# Patient Record
Sex: Female | Born: 1955 | Race: White | Hispanic: No | Marital: Single | Smoking: Never smoker
Health system: Southern US, Community
[De-identification: ages and names within clinical notes are randomized; demographics above are authoritative.]

## PROBLEM LIST (undated history)

## (undated) DIAGNOSIS — R569 Unspecified convulsions: Secondary | ICD-10-CM

## (undated) DIAGNOSIS — K909 Intestinal malabsorption, unspecified: Secondary | ICD-10-CM

## (undated) DIAGNOSIS — K7030 Alcoholic cirrhosis of liver without ascites: Secondary | ICD-10-CM

## (undated) DIAGNOSIS — K859 Acute pancreatitis without necrosis or infection, unspecified: Secondary | ICD-10-CM

## (undated) DIAGNOSIS — K769 Liver disease, unspecified: Secondary | ICD-10-CM

## (undated) DIAGNOSIS — K219 Gastro-esophageal reflux disease without esophagitis: Secondary | ICD-10-CM

## (undated) DIAGNOSIS — E119 Type 2 diabetes mellitus without complications: Secondary | ICD-10-CM

## (undated) DIAGNOSIS — K861 Other chronic pancreatitis: Secondary | ICD-10-CM

## (undated) HISTORY — DX: Unspecified convulsions: R56.9

## (undated) HISTORY — PX: TUBAL LIGATION: SHX77

## (undated) HISTORY — PX: CHOLECYSTECTOMY: SHX55

## (undated) HISTORY — PX: MYOMECTOMY: SHX85

## (undated) HISTORY — DX: Intestinal malabsorption, unspecified: K90.9

## (undated) HISTORY — PX: CATARACT EXTRACTION: SUR2

## (undated) HISTORY — DX: Acute pancreatitis without necrosis or infection, unspecified: K85.90

## (undated) HISTORY — DX: Other chronic pancreatitis: K86.1

## (undated) HISTORY — PX: ROUX-EN-Y GASTRIC BYPASS: SHX1104

## (undated) HISTORY — PX: APPENDECTOMY: SHX54

## (undated) HISTORY — PX: OTHER SURGICAL HISTORY: SHX169

## (undated) MED FILL — Medication: Fill #0 | Status: CN

## (undated) MED FILL — Buprenorphine TD Patch Weekly 20 MCG/HR: TRANSDERMAL | Fill #0 | Status: CN

## (undated) MED FILL — Buprenorphine TD Patch Weekly 20 MCG/HR: TRANSDERMAL | Fill #0 | Status: AC

---

## 1988-03-15 HISTORY — PX: JEJUNOSTOMY FEEDING TUBE: SUR737

## 1997-08-21 ENCOUNTER — Other Ambulatory Visit: Admission: RE | Admit: 1997-08-21 | Discharge: 1997-08-21 | Payer: Self-pay | Admitting: Gynecology

## 1998-06-30 ENCOUNTER — Encounter: Payer: Self-pay | Admitting: Gastroenterology

## 1998-06-30 ENCOUNTER — Ambulatory Visit (HOSPITAL_COMMUNITY): Admission: RE | Admit: 1998-06-30 | Discharge: 1998-06-30 | Payer: Self-pay | Admitting: Gastroenterology

## 1998-10-18 ENCOUNTER — Ambulatory Visit (HOSPITAL_COMMUNITY): Admission: RE | Admit: 1998-10-18 | Discharge: 1998-10-18 | Payer: Self-pay | Admitting: *Deleted

## 1998-10-18 ENCOUNTER — Encounter: Payer: Self-pay | Admitting: *Deleted

## 1998-12-05 ENCOUNTER — Encounter: Payer: Self-pay | Admitting: Gastroenterology

## 1998-12-05 ENCOUNTER — Ambulatory Visit (HOSPITAL_COMMUNITY): Admission: RE | Admit: 1998-12-05 | Discharge: 1998-12-05 | Payer: Self-pay | Admitting: Interventional Radiology

## 1998-12-18 ENCOUNTER — Encounter: Payer: Self-pay | Admitting: Gastroenterology

## 1998-12-18 ENCOUNTER — Ambulatory Visit (HOSPITAL_COMMUNITY): Admission: RE | Admit: 1998-12-18 | Discharge: 1998-12-18 | Payer: Self-pay | Admitting: Gastroenterology

## 1999-02-09 ENCOUNTER — Other Ambulatory Visit: Admission: RE | Admit: 1999-02-09 | Discharge: 1999-02-09 | Payer: Self-pay | Admitting: Gynecology

## 1999-02-27 ENCOUNTER — Encounter: Payer: Self-pay | Admitting: Gastroenterology

## 1999-02-27 ENCOUNTER — Ambulatory Visit (HOSPITAL_COMMUNITY): Admission: RE | Admit: 1999-02-27 | Discharge: 1999-02-27 | Payer: Self-pay | Admitting: Gastroenterology

## 1999-03-04 ENCOUNTER — Ambulatory Visit (HOSPITAL_COMMUNITY): Admission: RE | Admit: 1999-03-04 | Discharge: 1999-03-04 | Payer: Self-pay | Admitting: Gastroenterology

## 1999-03-04 ENCOUNTER — Encounter: Payer: Self-pay | Admitting: Gastroenterology

## 1999-06-05 ENCOUNTER — Encounter: Payer: Self-pay | Admitting: Gastroenterology

## 1999-06-05 ENCOUNTER — Ambulatory Visit (HOSPITAL_COMMUNITY): Admission: RE | Admit: 1999-06-05 | Discharge: 1999-06-05 | Payer: Self-pay | Admitting: Gastroenterology

## 1999-06-15 ENCOUNTER — Encounter: Payer: Self-pay | Admitting: Gastroenterology

## 1999-06-15 ENCOUNTER — Ambulatory Visit: Admission: RE | Admit: 1999-06-15 | Discharge: 1999-06-15 | Payer: Self-pay | Admitting: Gastroenterology

## 1999-06-23 ENCOUNTER — Emergency Department (HOSPITAL_COMMUNITY): Admission: EM | Admit: 1999-06-23 | Discharge: 1999-06-24 | Payer: Self-pay | Admitting: Emergency Medicine

## 1999-06-23 ENCOUNTER — Encounter: Payer: Self-pay | Admitting: Emergency Medicine

## 1999-06-24 ENCOUNTER — Encounter: Payer: Self-pay | Admitting: Emergency Medicine

## 1999-07-03 ENCOUNTER — Encounter: Payer: Self-pay | Admitting: Gastroenterology

## 1999-07-03 ENCOUNTER — Ambulatory Visit (HOSPITAL_COMMUNITY): Admission: RE | Admit: 1999-07-03 | Discharge: 1999-07-03 | Payer: Self-pay | Admitting: Gastroenterology

## 1999-09-04 ENCOUNTER — Encounter: Payer: Self-pay | Admitting: Gastroenterology

## 1999-09-04 ENCOUNTER — Ambulatory Visit (HOSPITAL_COMMUNITY): Admission: RE | Admit: 1999-09-04 | Discharge: 1999-09-04 | Payer: Self-pay | Admitting: Gastroenterology

## 1999-09-18 ENCOUNTER — Encounter: Payer: Self-pay | Admitting: Gastroenterology

## 1999-09-18 ENCOUNTER — Ambulatory Visit (HOSPITAL_COMMUNITY): Admission: RE | Admit: 1999-09-18 | Discharge: 1999-09-18 | Payer: Self-pay | Admitting: Gastroenterology

## 1999-11-02 ENCOUNTER — Ambulatory Visit (HOSPITAL_COMMUNITY): Admission: RE | Admit: 1999-11-02 | Discharge: 1999-11-02 | Payer: Self-pay | Admitting: Gastroenterology

## 1999-11-02 ENCOUNTER — Encounter: Payer: Self-pay | Admitting: Gastroenterology

## 1999-11-06 ENCOUNTER — Ambulatory Visit (HOSPITAL_COMMUNITY): Admission: RE | Admit: 1999-11-06 | Discharge: 1999-11-06 | Payer: Self-pay | Admitting: Gastroenterology

## 1999-11-06 ENCOUNTER — Encounter: Payer: Self-pay | Admitting: Gastroenterology

## 1999-11-17 ENCOUNTER — Encounter: Payer: Self-pay | Admitting: Gastroenterology

## 1999-11-17 ENCOUNTER — Ambulatory Visit (HOSPITAL_COMMUNITY): Admission: RE | Admit: 1999-11-17 | Discharge: 1999-11-17 | Payer: Self-pay | Admitting: Gastroenterology

## 2000-02-01 ENCOUNTER — Other Ambulatory Visit: Admission: RE | Admit: 2000-02-01 | Discharge: 2000-02-01 | Payer: Self-pay | Admitting: Gynecology

## 2000-05-17 ENCOUNTER — Encounter: Admission: RE | Admit: 2000-05-17 | Discharge: 2000-08-15 | Payer: Self-pay | Admitting: Anesthesiology

## 2000-06-06 ENCOUNTER — Encounter: Payer: Self-pay | Admitting: Gastroenterology

## 2000-06-06 ENCOUNTER — Inpatient Hospital Stay (HOSPITAL_COMMUNITY): Admission: EM | Admit: 2000-06-06 | Discharge: 2000-06-17 | Payer: Self-pay | Admitting: Emergency Medicine

## 2000-06-06 ENCOUNTER — Encounter: Admission: RE | Admit: 2000-06-06 | Discharge: 2000-06-06 | Payer: Self-pay | Admitting: Gastroenterology

## 2000-06-07 ENCOUNTER — Encounter: Payer: Self-pay | Admitting: Gastroenterology

## 2000-06-09 ENCOUNTER — Encounter: Payer: Self-pay | Admitting: Gastroenterology

## 2000-06-11 ENCOUNTER — Encounter: Payer: Self-pay | Admitting: Gastroenterology

## 2000-06-13 ENCOUNTER — Encounter: Payer: Self-pay | Admitting: *Deleted

## 2000-09-06 ENCOUNTER — Encounter: Payer: Self-pay | Admitting: Emergency Medicine

## 2000-09-06 ENCOUNTER — Inpatient Hospital Stay (HOSPITAL_COMMUNITY): Admission: EM | Admit: 2000-09-06 | Discharge: 2000-09-22 | Payer: Self-pay | Admitting: Emergency Medicine

## 2000-09-07 ENCOUNTER — Encounter: Payer: Self-pay | Admitting: Gastroenterology

## 2000-09-08 ENCOUNTER — Encounter: Payer: Self-pay | Admitting: Gastroenterology

## 2000-09-10 ENCOUNTER — Encounter: Payer: Self-pay | Admitting: Gastroenterology

## 2000-09-11 ENCOUNTER — Encounter: Payer: Self-pay | Admitting: Gastroenterology

## 2000-09-14 ENCOUNTER — Encounter: Payer: Self-pay | Admitting: Gastroenterology

## 2000-09-20 ENCOUNTER — Encounter: Admission: RE | Admit: 2000-09-20 | Discharge: 2000-11-12 | Payer: Self-pay | Admitting: Anesthesiology

## 2000-10-14 ENCOUNTER — Encounter: Payer: Self-pay | Admitting: Gastroenterology

## 2000-10-14 ENCOUNTER — Ambulatory Visit (HOSPITAL_COMMUNITY): Admission: RE | Admit: 2000-10-14 | Discharge: 2000-10-14 | Payer: Self-pay | Admitting: Gastroenterology

## 2001-03-24 ENCOUNTER — Other Ambulatory Visit: Admission: RE | Admit: 2001-03-24 | Discharge: 2001-03-24 | Payer: Self-pay | Admitting: Gynecology

## 2001-06-28 ENCOUNTER — Encounter: Payer: Self-pay | Admitting: Gastroenterology

## 2001-06-28 ENCOUNTER — Ambulatory Visit (HOSPITAL_COMMUNITY): Admission: RE | Admit: 2001-06-28 | Discharge: 2001-06-28 | Payer: Self-pay | Admitting: Gastroenterology

## 2001-07-04 ENCOUNTER — Emergency Department (HOSPITAL_COMMUNITY): Admission: EM | Admit: 2001-07-04 | Discharge: 2001-07-04 | Payer: Self-pay | Admitting: *Deleted

## 2001-09-28 ENCOUNTER — Encounter: Payer: Self-pay | Admitting: Gastroenterology

## 2001-09-28 ENCOUNTER — Encounter: Admission: RE | Admit: 2001-09-28 | Discharge: 2001-09-28 | Payer: Self-pay | Admitting: Gastroenterology

## 2001-12-05 ENCOUNTER — Encounter: Payer: Self-pay | Admitting: Family Medicine

## 2001-12-05 ENCOUNTER — Encounter: Admission: RE | Admit: 2001-12-05 | Discharge: 2001-12-05 | Payer: Self-pay | Admitting: Family Medicine

## 2001-12-07 ENCOUNTER — Encounter: Payer: Self-pay | Admitting: Thoracic Surgery (Cardiothoracic Vascular Surgery)

## 2001-12-08 ENCOUNTER — Inpatient Hospital Stay (HOSPITAL_COMMUNITY)
Admission: RE | Admit: 2001-12-08 | Discharge: 2001-12-20 | Payer: Self-pay | Admitting: Thoracic Surgery (Cardiothoracic Vascular Surgery)

## 2001-12-08 ENCOUNTER — Encounter: Payer: Self-pay | Admitting: Thoracic Surgery (Cardiothoracic Vascular Surgery)

## 2001-12-08 ENCOUNTER — Encounter (INDEPENDENT_AMBULATORY_CARE_PROVIDER_SITE_OTHER): Payer: Self-pay | Admitting: *Deleted

## 2001-12-09 ENCOUNTER — Encounter: Payer: Self-pay | Admitting: Thoracic Surgery (Cardiothoracic Vascular Surgery)

## 2001-12-10 ENCOUNTER — Encounter: Payer: Self-pay | Admitting: Thoracic Surgery (Cardiothoracic Vascular Surgery)

## 2001-12-11 ENCOUNTER — Encounter: Payer: Self-pay | Admitting: Surgery

## 2001-12-12 ENCOUNTER — Encounter: Payer: Self-pay | Admitting: Thoracic Surgery (Cardiothoracic Vascular Surgery)

## 2001-12-13 ENCOUNTER — Encounter: Payer: Self-pay | Admitting: Thoracic Surgery (Cardiothoracic Vascular Surgery)

## 2001-12-14 ENCOUNTER — Encounter: Payer: Self-pay | Admitting: Thoracic Surgery (Cardiothoracic Vascular Surgery)

## 2001-12-18 ENCOUNTER — Encounter: Payer: Self-pay | Admitting: Thoracic Surgery (Cardiothoracic Vascular Surgery)

## 2001-12-19 ENCOUNTER — Encounter: Payer: Self-pay | Admitting: Cardiology

## 2002-01-03 ENCOUNTER — Encounter: Payer: Self-pay | Admitting: Thoracic Surgery (Cardiothoracic Vascular Surgery)

## 2002-01-03 ENCOUNTER — Encounter
Admission: RE | Admit: 2002-01-03 | Discharge: 2002-01-03 | Payer: Self-pay | Admitting: Thoracic Surgery (Cardiothoracic Vascular Surgery)

## 2002-06-18 ENCOUNTER — Other Ambulatory Visit: Admission: RE | Admit: 2002-06-18 | Discharge: 2002-06-18 | Payer: Self-pay | Admitting: Gynecology

## 2002-06-26 ENCOUNTER — Ambulatory Visit (HOSPITAL_COMMUNITY): Admission: RE | Admit: 2002-06-26 | Discharge: 2002-06-26 | Payer: Self-pay | Admitting: Gastroenterology

## 2002-06-26 ENCOUNTER — Encounter: Payer: Self-pay | Admitting: Gastroenterology

## 2002-08-16 ENCOUNTER — Encounter: Payer: Self-pay | Admitting: Gastroenterology

## 2002-08-16 ENCOUNTER — Ambulatory Visit (HOSPITAL_COMMUNITY): Admission: RE | Admit: 2002-08-16 | Discharge: 2002-08-16 | Payer: Self-pay | Admitting: Gastroenterology

## 2003-09-27 ENCOUNTER — Emergency Department (HOSPITAL_COMMUNITY): Admission: EM | Admit: 2003-09-27 | Discharge: 2003-09-28 | Payer: Self-pay | Admitting: Emergency Medicine

## 2003-12-24 ENCOUNTER — Other Ambulatory Visit: Admission: RE | Admit: 2003-12-24 | Discharge: 2003-12-24 | Payer: Self-pay | Admitting: Gynecology

## 2004-03-12 ENCOUNTER — Emergency Department (HOSPITAL_COMMUNITY): Admission: EM | Admit: 2004-03-12 | Discharge: 2004-03-13 | Payer: Self-pay | Admitting: Emergency Medicine

## 2004-04-07 ENCOUNTER — Ambulatory Visit (HOSPITAL_COMMUNITY): Admission: RE | Admit: 2004-04-07 | Discharge: 2004-04-07 | Payer: Self-pay | Admitting: Gastroenterology

## 2004-11-24 ENCOUNTER — Ambulatory Visit (HOSPITAL_COMMUNITY): Admission: RE | Admit: 2004-11-24 | Discharge: 2004-11-24 | Payer: Self-pay | Admitting: Gastroenterology

## 2004-12-22 ENCOUNTER — Ambulatory Visit (HOSPITAL_COMMUNITY): Admission: RE | Admit: 2004-12-22 | Discharge: 2004-12-22 | Payer: Self-pay | Admitting: Gastroenterology

## 2004-12-24 ENCOUNTER — Emergency Department (HOSPITAL_COMMUNITY): Admission: EM | Admit: 2004-12-24 | Discharge: 2004-12-25 | Payer: Self-pay | Admitting: Emergency Medicine

## 2005-05-14 ENCOUNTER — Ambulatory Visit: Payer: Self-pay | Admitting: *Deleted

## 2005-05-28 ENCOUNTER — Other Ambulatory Visit: Admission: RE | Admit: 2005-05-28 | Discharge: 2005-05-28 | Payer: Self-pay | Admitting: Gynecology

## 2005-12-02 ENCOUNTER — Inpatient Hospital Stay (HOSPITAL_COMMUNITY): Admission: EM | Admit: 2005-12-02 | Discharge: 2005-12-09 | Payer: Self-pay | Admitting: Emergency Medicine

## 2005-12-17 ENCOUNTER — Ambulatory Visit (HOSPITAL_COMMUNITY): Admission: RE | Admit: 2005-12-17 | Discharge: 2005-12-17 | Payer: Self-pay | Admitting: Gastroenterology

## 2006-05-20 ENCOUNTER — Inpatient Hospital Stay (HOSPITAL_COMMUNITY): Admission: EM | Admit: 2006-05-20 | Discharge: 2006-05-26 | Payer: Self-pay | Admitting: Emergency Medicine

## 2006-08-25 ENCOUNTER — Inpatient Hospital Stay (HOSPITAL_COMMUNITY): Admission: EM | Admit: 2006-08-25 | Discharge: 2006-09-02 | Payer: Self-pay | Admitting: Emergency Medicine

## 2006-09-07 ENCOUNTER — Encounter: Admission: RE | Admit: 2006-09-07 | Discharge: 2006-09-07 | Payer: Self-pay | Admitting: Gastroenterology

## 2006-09-14 ENCOUNTER — Encounter: Admission: RE | Admit: 2006-09-14 | Discharge: 2006-09-14 | Payer: Self-pay | Admitting: Family Medicine

## 2006-11-04 ENCOUNTER — Other Ambulatory Visit: Admission: RE | Admit: 2006-11-04 | Discharge: 2006-11-04 | Payer: Self-pay | Admitting: Gynecology

## 2006-11-24 ENCOUNTER — Encounter: Admission: RE | Admit: 2006-11-24 | Discharge: 2006-11-24 | Payer: Self-pay | Admitting: Gastroenterology

## 2006-12-29 ENCOUNTER — Encounter: Admission: RE | Admit: 2006-12-29 | Discharge: 2006-12-29 | Payer: Self-pay | Admitting: Gynecology

## 2007-01-04 ENCOUNTER — Ambulatory Visit (HOSPITAL_COMMUNITY): Admission: RE | Admit: 2007-01-04 | Discharge: 2007-01-04 | Payer: Self-pay | Admitting: *Deleted

## 2007-01-06 ENCOUNTER — Ambulatory Visit (HOSPITAL_COMMUNITY): Admission: RE | Admit: 2007-01-06 | Discharge: 2007-01-06 | Payer: Self-pay | Admitting: Interventional Radiology

## 2007-01-17 ENCOUNTER — Ambulatory Visit (HOSPITAL_COMMUNITY): Admission: RE | Admit: 2007-01-17 | Discharge: 2007-01-17 | Payer: Self-pay | Admitting: Interventional Radiology

## 2007-01-26 ENCOUNTER — Ambulatory Visit (HOSPITAL_COMMUNITY): Admission: RE | Admit: 2007-01-26 | Discharge: 2007-01-26 | Payer: Self-pay | Admitting: Gastroenterology

## 2007-02-01 ENCOUNTER — Ambulatory Visit (HOSPITAL_COMMUNITY): Admission: RE | Admit: 2007-02-01 | Discharge: 2007-02-01 | Payer: Self-pay | Admitting: Interventional Radiology

## 2007-02-06 ENCOUNTER — Ambulatory Visit (HOSPITAL_COMMUNITY): Admission: RE | Admit: 2007-02-06 | Discharge: 2007-02-06 | Payer: Self-pay | Admitting: Interventional Radiology

## 2007-03-06 ENCOUNTER — Emergency Department (HOSPITAL_COMMUNITY): Admission: EM | Admit: 2007-03-06 | Discharge: 2007-03-06 | Payer: Self-pay | Admitting: Emergency Medicine

## 2007-03-30 ENCOUNTER — Encounter: Admission: RE | Admit: 2007-03-30 | Discharge: 2007-03-30 | Payer: Self-pay | Admitting: Interventional Radiology

## 2007-04-09 ENCOUNTER — Emergency Department (HOSPITAL_COMMUNITY): Admission: EM | Admit: 2007-04-09 | Discharge: 2007-04-09 | Payer: Self-pay | Admitting: Emergency Medicine

## 2007-05-06 ENCOUNTER — Ambulatory Visit (HOSPITAL_COMMUNITY): Admission: RE | Admit: 2007-05-06 | Discharge: 2007-05-06 | Payer: Self-pay | Admitting: Anesthesiology

## 2007-08-06 ENCOUNTER — Emergency Department (HOSPITAL_COMMUNITY): Admission: EM | Admit: 2007-08-06 | Discharge: 2007-08-06 | Payer: Self-pay | Admitting: Emergency Medicine

## 2007-08-30 ENCOUNTER — Emergency Department (HOSPITAL_COMMUNITY): Admission: EM | Admit: 2007-08-30 | Discharge: 2007-08-31 | Payer: Self-pay | Admitting: Emergency Medicine

## 2007-11-06 ENCOUNTER — Other Ambulatory Visit: Admission: RE | Admit: 2007-11-06 | Discharge: 2007-11-06 | Payer: Self-pay | Admitting: Gynecology

## 2007-11-21 ENCOUNTER — Emergency Department (HOSPITAL_COMMUNITY): Admission: EM | Admit: 2007-11-21 | Discharge: 2007-11-21 | Payer: Self-pay | Admitting: Emergency Medicine

## 2007-11-24 ENCOUNTER — Encounter: Admission: RE | Admit: 2007-11-24 | Discharge: 2007-11-24 | Payer: Self-pay | Admitting: Gastroenterology

## 2008-01-30 ENCOUNTER — Ambulatory Visit: Payer: Self-pay | Admitting: Gynecology

## 2008-02-06 ENCOUNTER — Ambulatory Visit: Payer: Self-pay | Admitting: Gynecology

## 2008-03-13 ENCOUNTER — Ambulatory Visit (HOSPITAL_COMMUNITY): Admission: RE | Admit: 2008-03-13 | Discharge: 2008-03-13 | Payer: Self-pay | Admitting: Gastroenterology

## 2008-03-14 ENCOUNTER — Ambulatory Visit (HOSPITAL_COMMUNITY): Admission: RE | Admit: 2008-03-14 | Discharge: 2008-03-14 | Payer: Self-pay | Admitting: Interventional Radiology

## 2008-03-15 HISTORY — PX: OTHER SURGICAL HISTORY: SHX169

## 2008-05-02 ENCOUNTER — Ambulatory Visit (HOSPITAL_COMMUNITY): Admission: RE | Admit: 2008-05-02 | Discharge: 2008-05-02 | Payer: Self-pay | Admitting: Interventional Radiology

## 2008-05-10 ENCOUNTER — Ambulatory Visit (HOSPITAL_BASED_OUTPATIENT_CLINIC_OR_DEPARTMENT_OTHER): Admission: RE | Admit: 2008-05-10 | Discharge: 2008-05-10 | Payer: Self-pay | Admitting: Orthopedic Surgery

## 2008-07-15 ENCOUNTER — Emergency Department (HOSPITAL_COMMUNITY): Admission: EM | Admit: 2008-07-15 | Discharge: 2008-07-15 | Payer: Self-pay | Admitting: Emergency Medicine

## 2008-08-07 ENCOUNTER — Ambulatory Visit (HOSPITAL_COMMUNITY): Admission: RE | Admit: 2008-08-07 | Discharge: 2008-08-07 | Payer: Self-pay | Admitting: Interventional Radiology

## 2008-09-06 ENCOUNTER — Emergency Department (HOSPITAL_COMMUNITY): Admission: EM | Admit: 2008-09-06 | Discharge: 2008-09-07 | Payer: Self-pay | Admitting: Emergency Medicine

## 2008-10-01 ENCOUNTER — Emergency Department (HOSPITAL_COMMUNITY): Admission: EM | Admit: 2008-10-01 | Discharge: 2008-10-01 | Payer: Self-pay | Admitting: Emergency Medicine

## 2008-11-12 ENCOUNTER — Ambulatory Visit (HOSPITAL_COMMUNITY): Admission: RE | Admit: 2008-11-12 | Discharge: 2008-11-12 | Payer: Self-pay | Admitting: Interventional Radiology

## 2009-01-30 ENCOUNTER — Other Ambulatory Visit: Admission: RE | Admit: 2009-01-30 | Discharge: 2009-01-30 | Payer: Self-pay | Admitting: Gynecology

## 2009-01-30 ENCOUNTER — Ambulatory Visit: Payer: Self-pay | Admitting: Gynecology

## 2009-02-10 ENCOUNTER — Ambulatory Visit: Payer: Self-pay | Admitting: Gynecology

## 2009-02-21 ENCOUNTER — Ambulatory Visit: Payer: Self-pay | Admitting: Gynecology

## 2009-07-21 IMAGING — XA IR REPLACE G/J TUBE W/ FLUORO
1 series · 6 of 6 positions shown · IV contrast (omnipaque)
Comparison: none

CLINICAL DATA: History of multiple prior abdominal surgeries.  The patient is maintained with gastrojejunal feeding via a 24-French bumper retention gastrostomy tube with coaxial placement of a 12-French gastrojejunal catheter extending into the jejunum.  The gastrostomy tube has now become eroded over time, causing tract irritation.  She now presents for replacement of the entire gastrojejunal catheter system. 
PLACEMENT OF GASTROJEJUNAL FEEDING TUBES ? 01/04/07:
Prior to the procedure, informed consent was obtained.  The patient received 1 gram IV Ancef.
Sedation:  4 mg IV Versed, 150 mcg IV fentanyl. 
Total moderate sedation time:  40 minutes. 
Contrast:  20 cc Omnipaque 300. 
Fluoro time:  11.4 minutes. 
Preexisting gastrostomy tube and coaxial jejunostomy tube were sterilely prepped and draped.  The oropharynx was sprayed with anesthetic spray and a 5-French diagnostic catheter advanced through the mouth and into the stomach under fluoroscopic guidance.  Fluoroscopy was performed to confirm position of the preexisting tube.  The coaxial jejunal catheter was then removed as the jejunal lumen and duodenum were slowly injected with diluted contrast material.  The bumper retention gastrostomy was then removed with manual traction.  A loop snare device was then advanced through the percutaneous tract into the stomach.  This was used to snare a guidewire advanced through the orogastric catheter, allowing retraction of the snare device out of the patient's mouth.  A new 24-French bumper retention gastrostomy tube was then looped around the snare device and pulled back through the patient's mouth.  Retention bumper was pulled up against the gastric wall.  External tubing was then cut to appropriate length. 
A diagnostic catheter was then advanced through the gastrostomy tube.  This was used to catheterize the distal stomach, duodenum, and proximal jejunum.  The catheter was removed over a guidewire.  A new 12-French coaxial jejunostomy catheter was then advanced over the guidewire and into the jejunum.  Catheter position was confirmed with a fluoroscopic spot image obtained after injection of contrast material via the jejunal lumen.  The catheter was flushed. 
Complications:  None.

[Series 1: run · 6 of 6 slices shown]
[im 1/6]
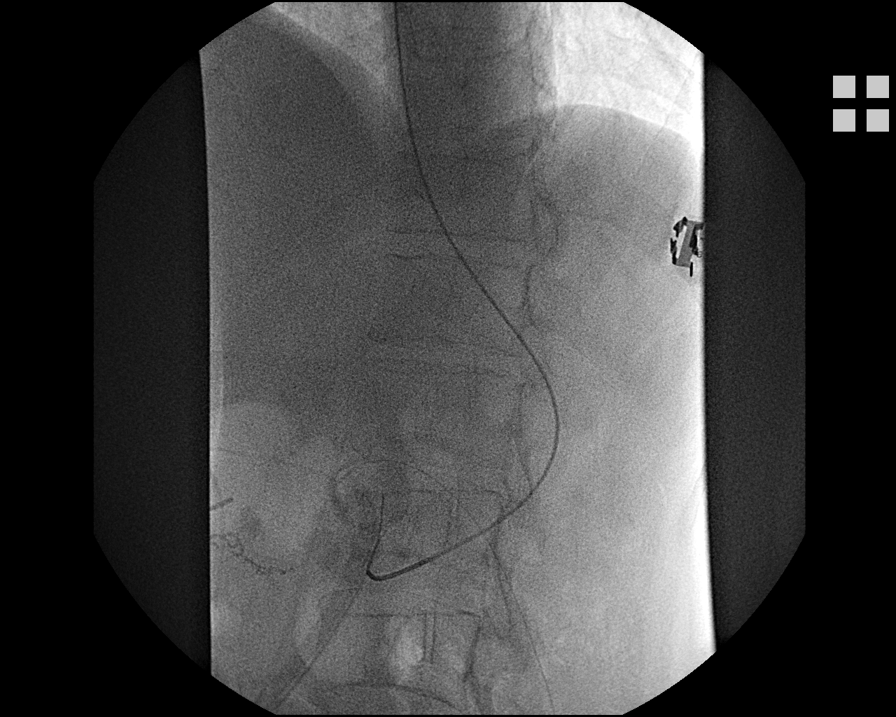
[im 2/6]
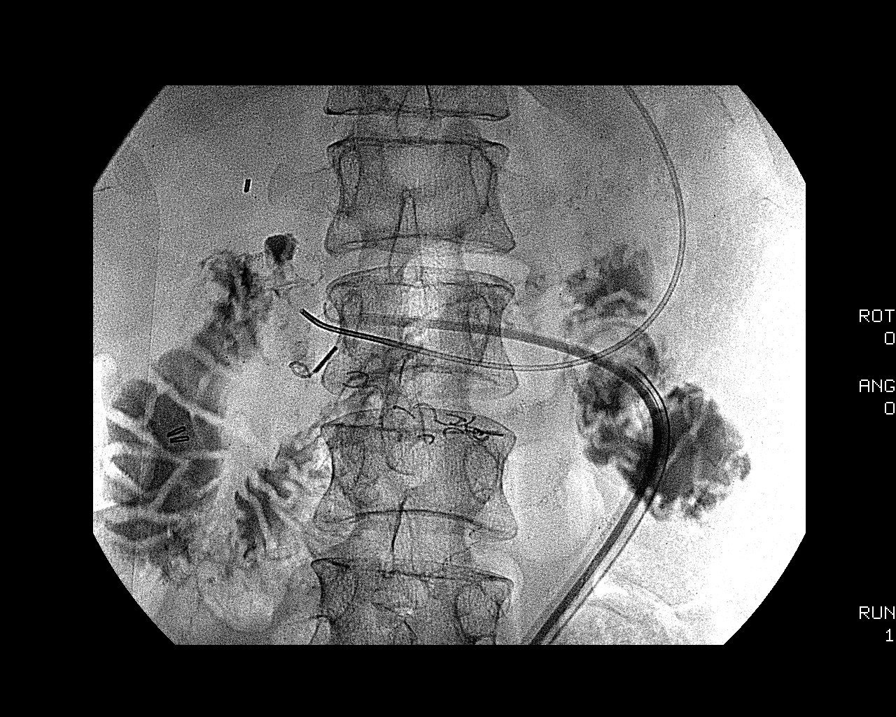
[im 3/6]
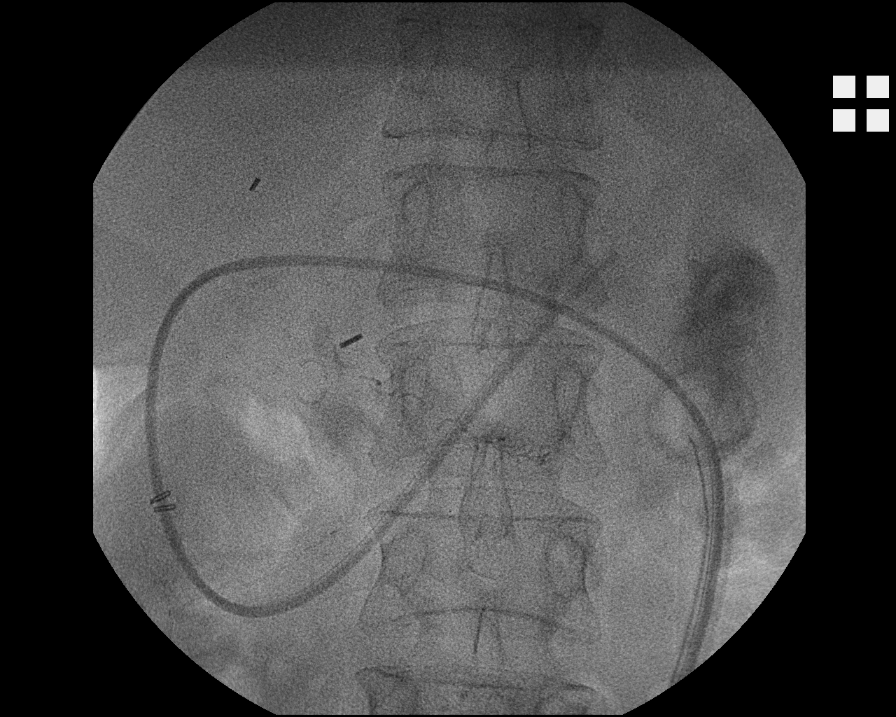
[im 4/6]
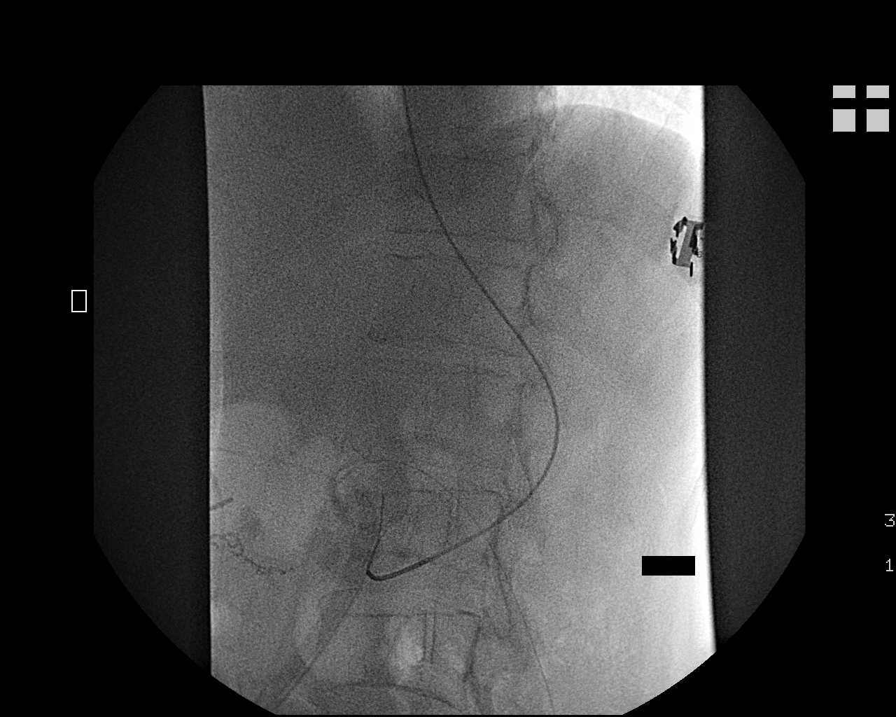
[im 5/6]
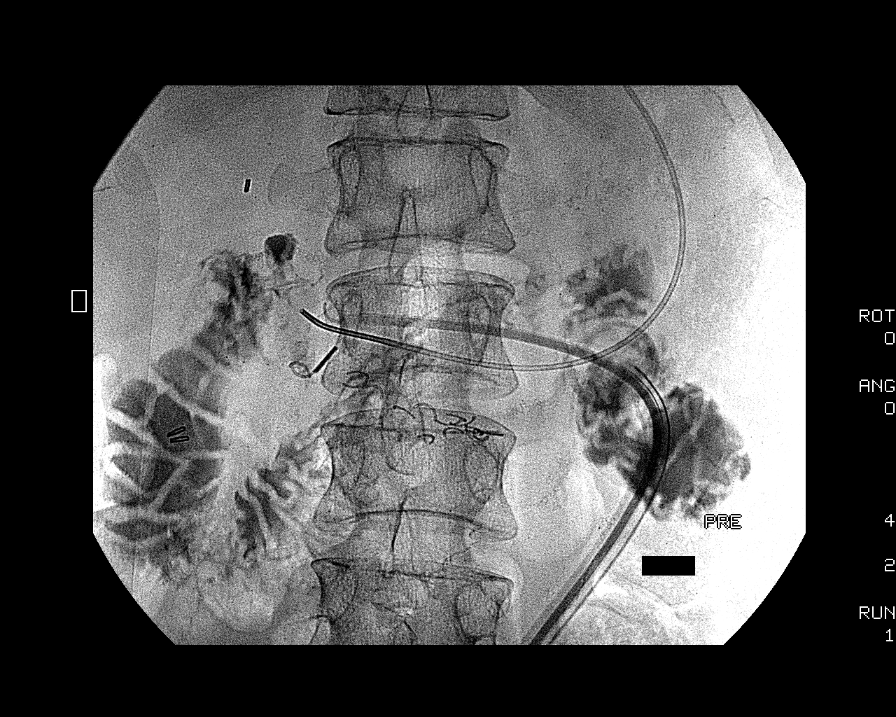
[im 6/6]
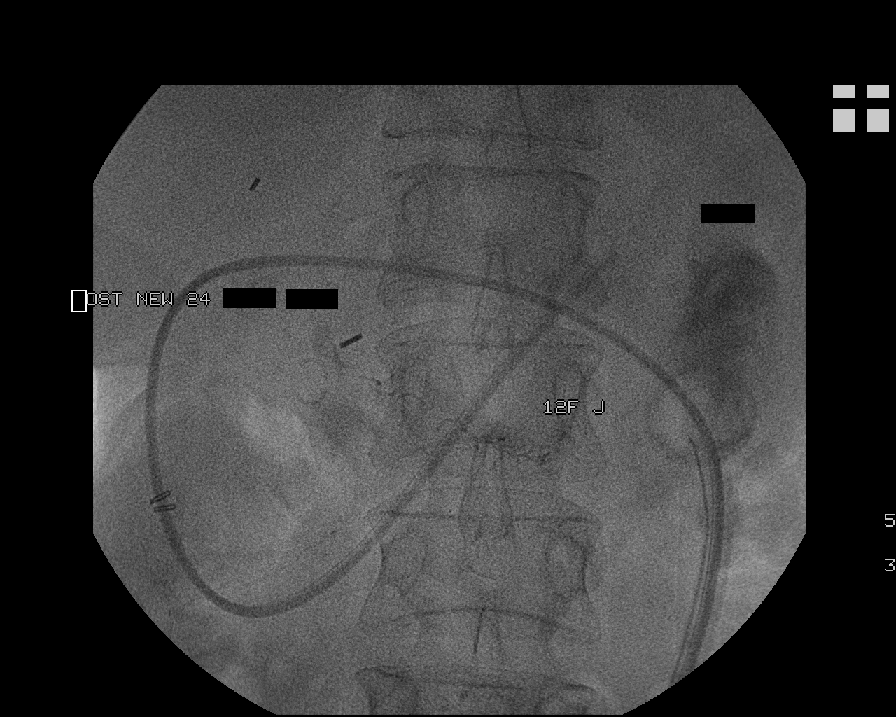

[6 of 6 positions shown; findings below may reference images not displayed]

FINDINGS: Removal of current gastrostomy and coaxial jejunostomy catheters shows visible deterioration of the catheters, especially of the outer gastrostomy catheter and its retention bumper.  The whole system was able to be removed, allowing placement of a new bumper retention gastrostomy tube.  A new coaxial jejunostomy catheter extends into the proximal jejunum.
IMPRESSION: Complete replacement of gastrojejunal feeding catheters, which consists of a 24-French bumper retention gastrostomy and coaxial 12-French gastrojejunostomy.  The jejunal catheter tip lies in the proximal jejunum.

## 2009-11-20 IMAGING — CR DG HIP COMPLETE 2+V*R*
3 series · 3 of 3 positions shown · non-contrast
Comparison: none

CLINICAL DATA: Right hip pain after recent fall.
 RIGHT HIP ? 3 VIEW:

[t pelvis a.p.]
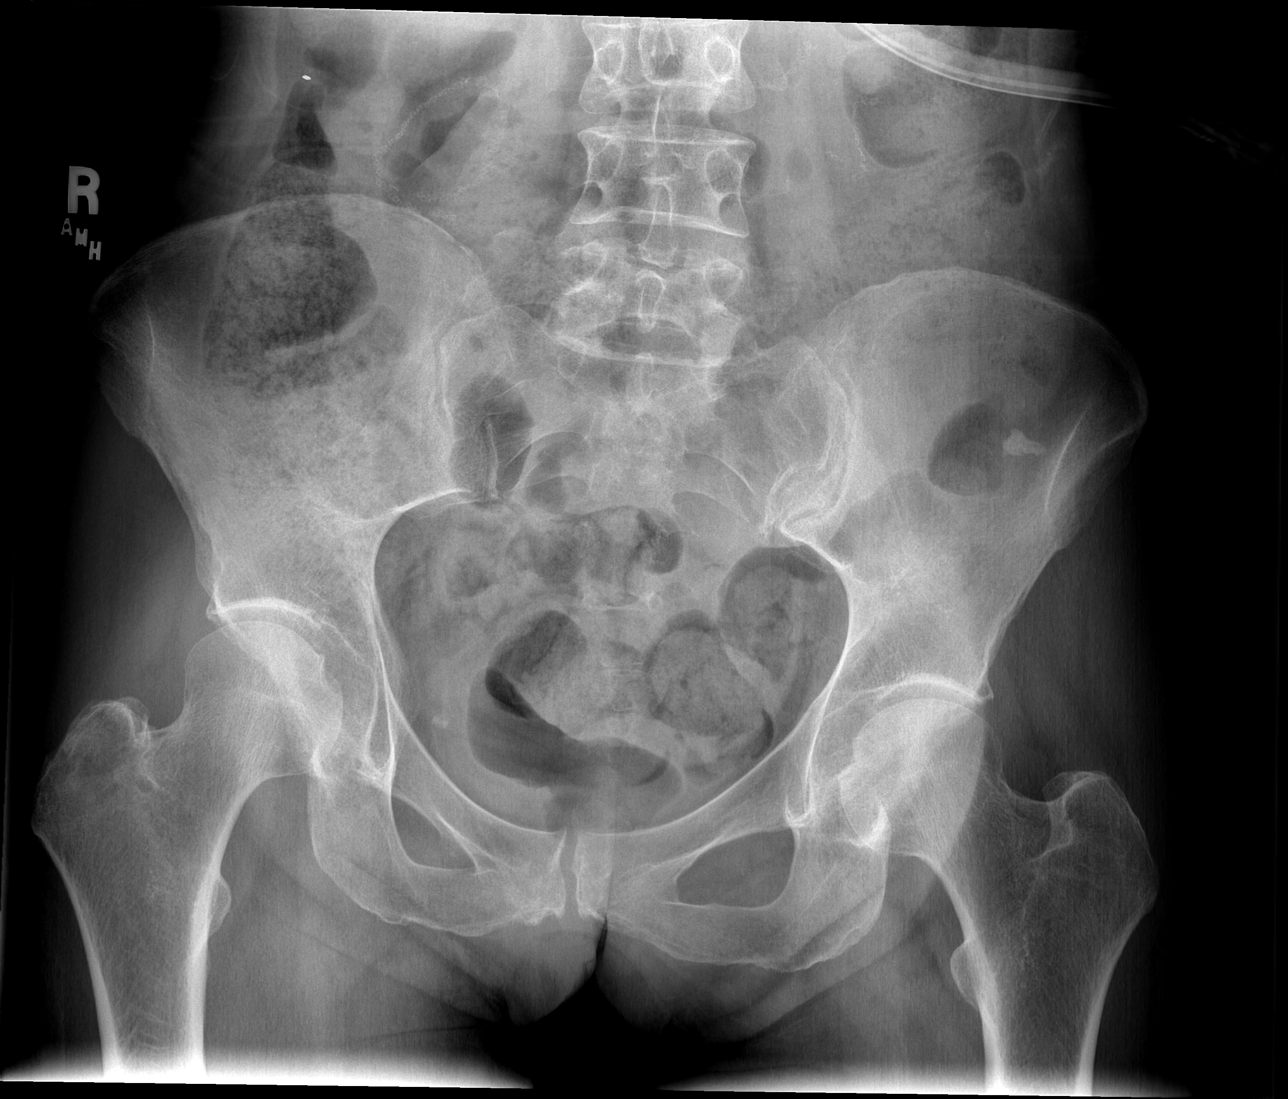

[t hip ap right]
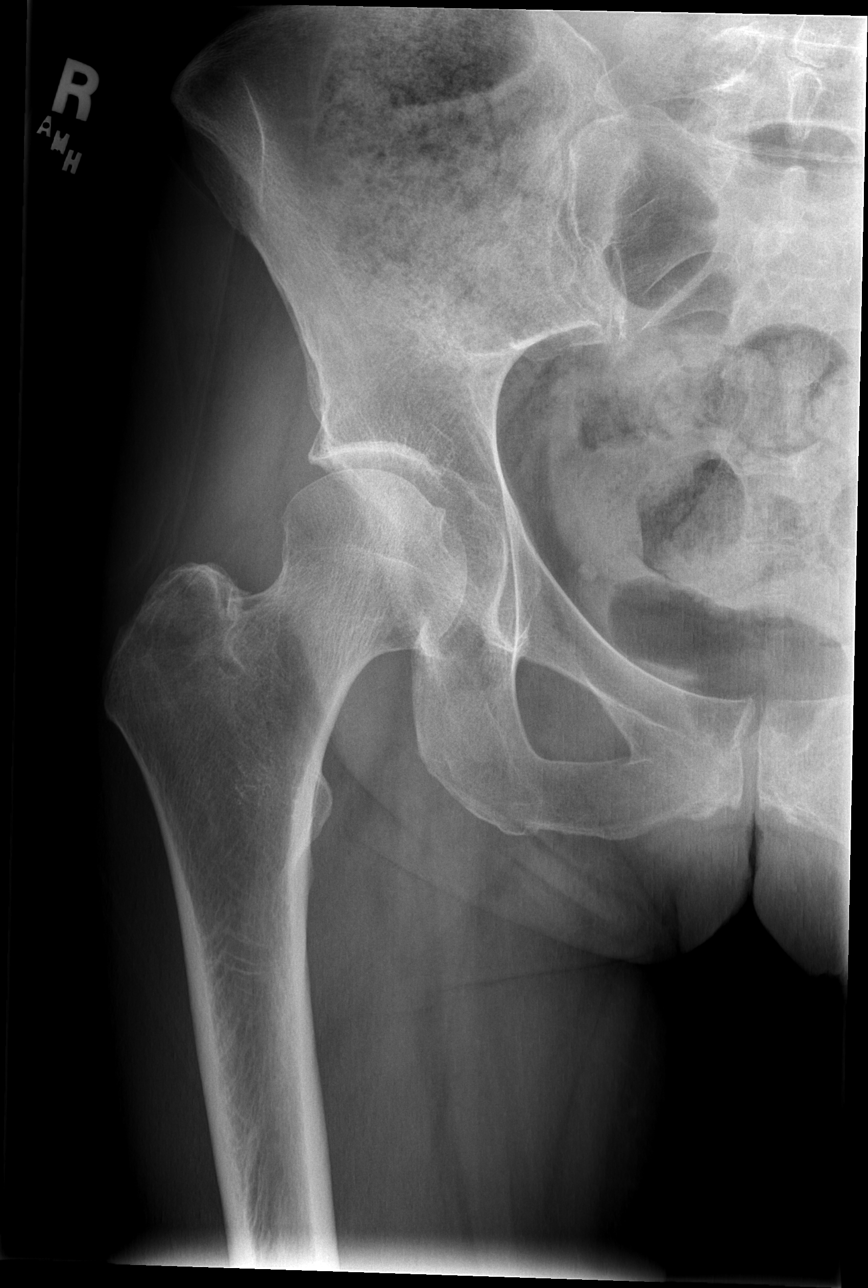

[t hip frog leg right]
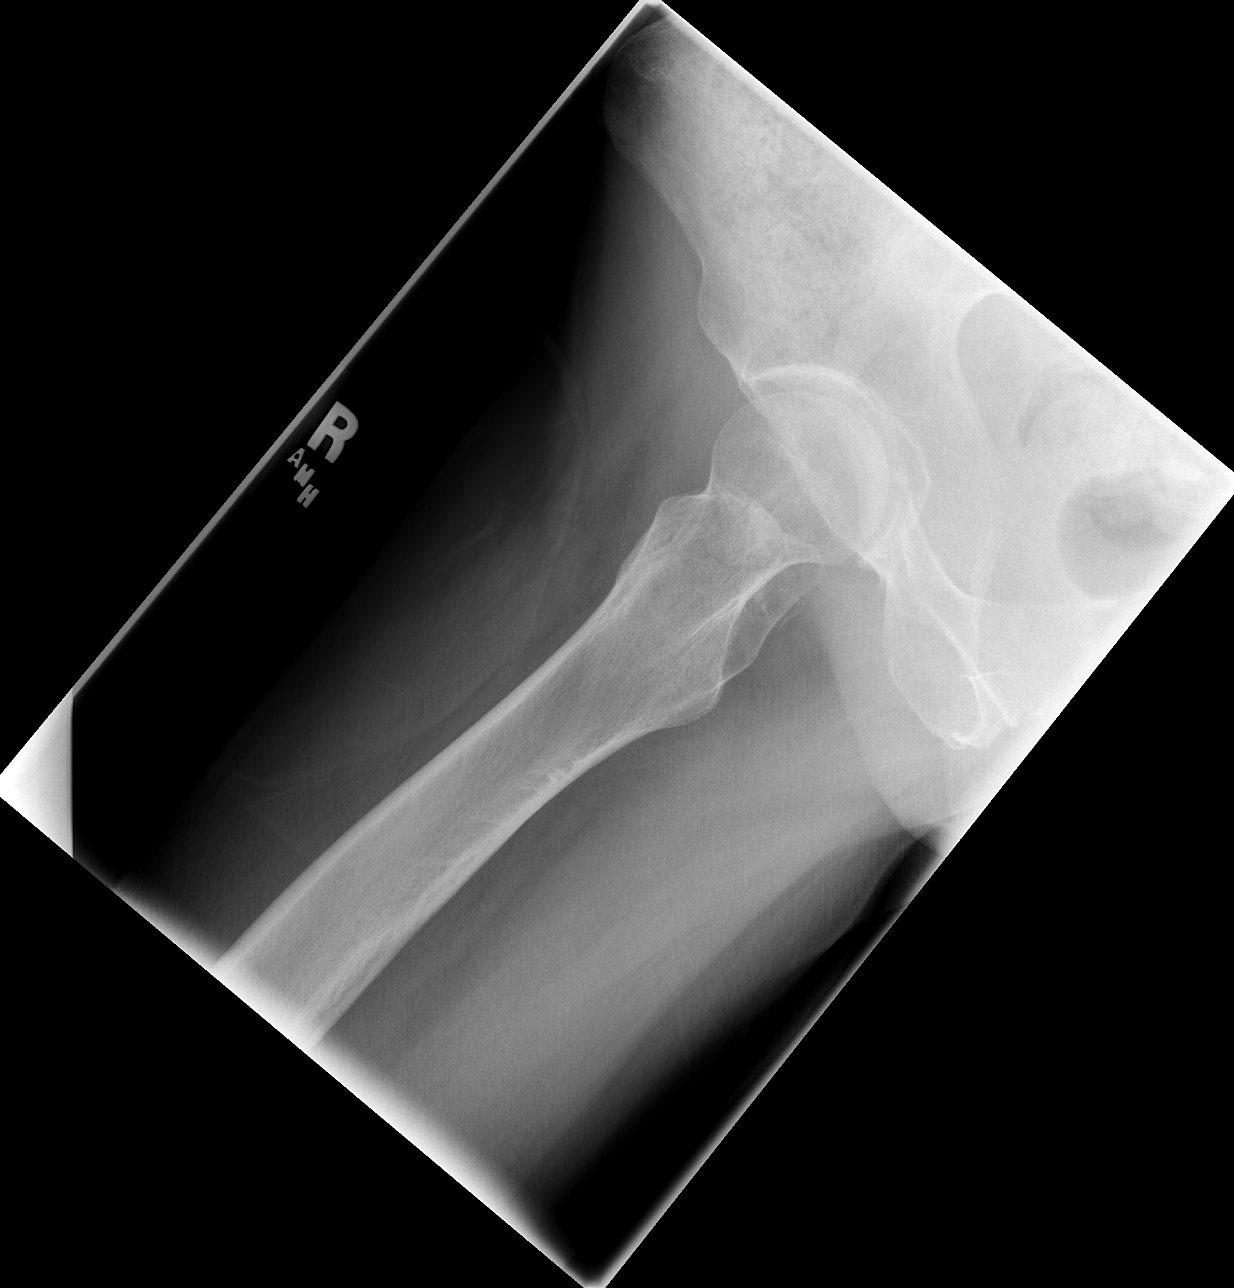

[3 of 3 positions shown; findings below may reference images not displayed]

FINDINGS: AP view of the pelvis as well as two additional views of the right hip demonstrate no displaced hip fracture or dislocation.  There may be a very subtle lucency involving the greater trochanter which is asymmetric compared to the left side.  Subtle trochanteric fracture cannot be excluded.  The rest of the bony pelvis is intact.
IMPRESSION: Possible subtle fracture of right greater trochanter versus degenerative change.  If there is persistent pain, CT of the right hip would be helpful for further evaluation.

## 2010-04-05 ENCOUNTER — Encounter: Payer: Self-pay | Admitting: Gastroenterology

## 2010-06-22 LAB — CBC
Hemoglobin: 11.4 g/dL — ABNORMAL LOW (ref 12.0–15.0)
MCHC: 34.1 g/dL (ref 30.0–36.0)
MCV: 94.5 fL (ref 78.0–100.0)
RBC: 3.55 MIL/uL — ABNORMAL LOW (ref 3.87–5.11)
WBC: 7.2 10*3/uL (ref 4.0–10.5)

## 2010-06-22 LAB — COMPREHENSIVE METABOLIC PANEL
ALT: 56 U/L — ABNORMAL HIGH (ref 0–35)
AST: 158 U/L — ABNORMAL HIGH (ref 0–37)
CO2: 24 mEq/L (ref 19–32)
Calcium: 8.1 mg/dL — ABNORMAL LOW (ref 8.4–10.5)
Chloride: 104 mEq/L (ref 96–112)
Creatinine, Ser: 0.96 mg/dL (ref 0.4–1.2)
GFR calc Af Amer: >60 mL/min (ref >60)
GFR calc non Af Amer: >60 mL/min (ref >60)
Glucose, Bld: 157 mg/dL — ABNORMAL HIGH (ref 70–99)
Total Bilirubin: 1.4 mg/dL — ABNORMAL HIGH (ref 0.3–1.2)

## 2010-06-22 LAB — URINALYSIS, ROUTINE W REFLEX MICROSCOPIC
Glucose, UA: NEGATIVE mg/dL
Ketones, ur: 40 mg/dL — AB
Leukocytes, UA: NEGATIVE
Nitrite: NEGATIVE
Specific Gravity, Urine: 1.025 (ref 1.005–1.030)
pH: 6.5 (ref 5.0–8.0)

## 2010-06-22 LAB — DIFFERENTIAL
Basophils Absolute: 0 10*3/uL (ref 0.0–0.1)
Eosinophils Absolute: 0 10*3/uL (ref 0.0–0.7)
Eosinophils Relative: 0 % (ref 0–5)
Lymphocytes Relative: 7 % — ABNORMAL LOW (ref 12–46)
Neutrophils Relative %: 86 % — ABNORMAL HIGH (ref 43–77)

## 2010-06-22 LAB — URINE MICROSCOPIC-ADD ON

## 2010-06-22 LAB — LACTIC ACID, PLASMA: Lactic Acid, Venous: 2.3 mmol/L — ABNORMAL HIGH (ref 0.5–2.2)

## 2010-06-22 LAB — LIPASE, BLOOD: Lipase: <10 U/L — ABNORMAL LOW (ref 11–59)

## 2010-06-30 LAB — BASIC METABOLIC PANEL
BUN: 11 mg/dL (ref 6–23)
Calcium: 9.4 mg/dL (ref 8.4–10.5)
Creatinine, Ser: 0.79 mg/dL (ref 0.4–1.2)
GFR calc non Af Amer: >60 mL/min (ref >60)
Glucose, Bld: 132 mg/dL — ABNORMAL HIGH (ref 70–99)

## 2010-06-30 LAB — POCT HEMOGLOBIN-HEMACUE: Hemoglobin: 12.1 g/dL (ref 12.0–15.0)

## 2010-07-28 NOTE — Op Note (Signed)
NAME:  Carolyn [name], Carolyn [name]                ACCOUNT NO.:  [id number]   MEDICAL RECORD NO.:  [id number]          PATIENT TYPE:  AMB   LOCATION:  DSC                          FACILITY:  [facility]   PHYSICIAN:  Harvie Junior, M.D.   DATE OF BIRTH:  Mar 09, 1956   DATE OF PROCEDURE:  05/10/2008  DATE OF DISCHARGE:                               OPERATIVE REPORT   PREOPERATIVE DIAGNOSIS:  Prepatellar bursitis with multiple previous  infections.   POSTOPERATIVE DIAGNOSIS:  Prepatellar bursitis with multiple previous  infections.   PROCEDURE:  Prepatellar bursectomy.   SURGEON:  Harvie Junior, MD   ASSISTANT:  Marshia Ly, PA   ANESTHESIA:  General.   BRIEF HISTORY:  [name] is a 55 year old female with a long history  of having had painful area over her left knee.  She had been treated a  couple of times for infection with antibiotics in the red area, it would  return.  Because of this continued area of erythema and concern for  ultimate infection, which would not resolve, we felt like she needed to  be taken to the operating room for excision of this prepatellar bursa.  She was brought to the operating room for this procedure.   PROCEDURE:  The patient was brought to the operating room.  After  adequate anesthesia was obtained with general anesthetic, the patient  was placed supine on the operating table.  The left leg was prepped and  draped in the usual sterile fashion.  Following this, a small incision  was made over the anterior aspects of the knee from the inferior pole of  the patella proximally, right over the area of bursitis.  The infected  patellar bursa was dissected free from the skin and the underlying  tissues.  Once this was completed, it was taken out in total with the  fluid still in the bursal sac.  At this point, the tourniquet was let  down and all bleeders controlled with electrocautery.  The wound was  then closed with a combination of subcuticular stitches and then  nylon  and we closed it over a drain, sterile compressive dressing.  We are  going to keep the drain in over the weekend, and we will see her back in  the office on Monday for pulling of the drain.  The estimated blood loss  for this procedure was none.  Marshia Ly assisted throughout the  case.      Harvie Junior, M.D.  Electronically Signed     JLG/MEDQ  D:  05/10/2008  T:  05/11/2008  Job:  [id number]

## 2010-07-28 NOTE — Consult Note (Signed)
NAME:  [facility]yn [name], Carolyn [name]                ACCOUNT NO.:  [id number]   MEDICAL RECORD NO.:  [id number]          PATIENT TYPE:  INP   LOCATION:  1316                         FACILITY:  [facility]   PHYSICIAN:  Corinna L. Lendell Caprice, MDDATE OF BIRTH:  15-Oct-1955   DATE OF CONSULTATION:  08/25/2006  DATE OF DISCHARGE:                                 CONSULTATION   REASON FOR CONSULTATION:  Left lower lobe pneumonia and acute renal  failure.   IMPRESSION AND RECOMMENDATIONS:  1. Abnormal density at left lung base on acute abdominal series.  The      patient does not have cough, fever, leukocytosis so clinically this      is inconsistent with pneumonia.  I will get a PA and lateral chest      x-ray to further evaluate and hold off on antibiotics (for      pneumonia) unless a definite infiltrate is seen or the patient      develops signs or symptoms of pneumonia.  2. Acute renal failure secondary to dehydration/pre-renal azotemia.  I      agree with IV fluids.  Her creatinine should normalize.  She has      poor IV access and usually gets PICC lines when admitted.  In agree      with PICC line as she does not have chronic renal failure.  3. Abdominal pain/ileus/vomiting/heme positive stool per GI.  4. Chronic pancreatitis.  5. Chronic pain.  6. Reported history of seizures.   HISTORY OF PRESENT ILLNESS:  Carolyn [name] is a pleasant 55 year old white  female patient of Dr. Carman Ching and Marny Lowenstein who presented to  the emergency room with abdominal pain across her mid abdomen.  It feels  different than her pain that she usually gets with pancreatitis. She has  not been eating or drinking well for the past 3 days.  She takes low fat  diet, small amounts but gets tube feedings at night time through a  jejunostomy tube.  She also has vomited a little. She has noted some  black drainage from her J-tube site and her abdomen feels bloated and  hot.  Part of the work up included a basic metabolic  panel.  Her BUN and  creatinine were found to be elevated which is a new problem for her.  She also had an acute abdominal series which showed vague left base  density so I am consulted for these issues.  The patient is being  admitted to the GI service for her abdominal complaints.   PAST MEDICAL HISTORY:  As above.   MEDICATIONS:  Duragesic, Prilosec, Elavil, oxycodone.   SOCIAL HISTORY:  The patient does not smoke.  She use to be a heavy  drinker but tries to abstain but occasionally does drink alcohol.  Her  last drink was 6 months ago.  She denies drug use.   FAMILY HISTORY:  Noncontributory.   REVIEW OF SYSTEMS:  As above, otherwise negative.   PHYSICAL EXAMINATION:  VITAL SIGNS:  Temperature is 99.2, blood pressure  91/55, pulse 107, respiratory rate 20, oxygen saturation 100%  on room  air.  GENERAL:  The patient is a thin white female who appears older than her  stated age in no acute distress.  HEENT:  Normocephalic, atraumatic.  Pupils equal, round, reactive to  light.  Sclerae anicteric.  She has dry mucous membranes.  Oropharynx is  without erythema or exudate.  NECK:  Supple.  No lymphadenopathy.  No thyromegaly.  No JVD.  LUNGS:  Clear to auscultation bilaterally without wheezes, rhonchi or  rales.  CARDIOVASCULAR:  Regular rate and rhythm without murmurs, gallops or  rubs.  ABDOMEN:  Her J-tube site has black drainage.  She has voluntary  guarding diffusely with some mild tenderness.  GU:  Deferred.  Per [name] she had heme positive yellow stool on  rectal exam.  EXTREMITIES:  No clubbing, cyanosis or edema.  Pulses are intact.  NEUROLOGICAL:  She is alert and oriented with normal affect.  Cranial  nerves and sensory motor exam are grossly intact  SKIN:  No rash, no jaundice.   LABORATORY:  White blood cell count is 7.2, hemoglobin 10.2, hematocrit  29.7, MCV 90.8, platelet count 135 with a mild left shift, atypical  lymphocytes, toxic granulation,  __________ .  Comprehensive metabolic  profile significant for a BUN of 47, creatinine of 2.47.  In March 2008  her BUN was 12, creatinine 0.78.  Albumin is 2.4.  Otherwise  unremarkable and total protein is 5.3, otherwise unremarkable complete  metabolic panel.  Lipase less than 10.  Acute abdominal series shows  abnormal density at the left lung base which appears new, suspicious for  pneumonia.  Abdominal pattern most consistent with a dynamic ileus.  Air  in the right upper quadrant.  Probably in the bile ducts but cannot  exclude with certainty portal gastrojejunostomy.   I would like to thank Dr. Matthias Hughs for this consultation.  Further  recommendations to follow.      Corinna L. Lendell Caprice, MD  Electronically Signed     CLS/MEDQ  D:  08/25/2006  T:  08/26/2006  Job:  [id number]   cc:   Jethro Bastos, M.D.  Fax: [phone]

## 2010-07-28 NOTE — Discharge Summary (Signed)
Carolyn [name], Carolyn [name]                ACCOUNT NO.:  [id number]   MEDICAL RECORD NO.:  [id number]          PATIENT TYPE:  INP   LOCATION:  1316                         [facility]ACILITY:  [facility]   PHYSICIAN:  Shirley Friar, MDDATE OF BIRTH:  Aug 18, 1955   DATE OF ADMISSION:  08/25/2006  DATE OF DISCHARGE:                               DISCHARGE SUMMARY   DISCHARGE DIAGNOSES:  1. Cellulitis.  2. History of chronic pancreatitis.  3. History of alcoholism.  4. History of chronic pain.  5. History of biliary stricture.  6. History of seizures.   DISCHARGE MEDICINES:  1. Clindamycin 450 mg p.o. q.i.d. in pill form or otherwise 30 mL p.o.      q.i.d. in liquid form per PEG tube for liquid form and oral      administration for pills.  2. Duragesic patch 200 mcg transdermally q.48h.  3. Roxicodone 20 mg/mL, take 0.5 mL to 1 mL q.4-6h. as needed.  4. Prilosec 20 mg p.o. daily.  5. Phenergan as needed.  6. Elavil as previously directed.   DISPOSITION:  Improved.   FOLLOW UP:  1. Return to see Dr. Carman Ching on July 21 at 9:45 a.m.  2. Follow with Dr. Marny Lowenstein in 3-4 weeks.  3. Follow with Dr. [name] as previously scheduled.   HOSPITAL COURSE:  Ms. Huertas is a 55 year old white female who was  admitted on August 25, 2006, complaining of severe abdominal pain, nausea  and vomiting.  Her PEG site was noted be draining clear fluid and her  bowel movements were normal.  She had a CAT scan done on presentation  which showed prominent loops of small bowel and a small amount of  ascites that was thought to be a nonspecific finding.  There was no  definite evidence of obstruction.  A small-bowel series was done which  was negative for small bowel obstruction.  She had blood cultures which  were negative x3.  A culture of around her PEG tube revealed methicillin-  sensitive Staphylococcus aureus.  She was initially started on  vancomycin and cephazolin, but the vancomycin was  discontinued after the  sensitivities showed it to be MSSA.  She did have low-grade temperatures  on antibiotics, but her temperature curve improved each day.  She was  initially put on a PCA pain pump for pain management and her pain levels  decreased each day.  She began tolerating tube feeds and clear liquid  diet, which was advanced to low-fat diet.  The PCA pump  was discontinued yesterday and she has done relatively well.  She did  request a prescription for Dilaudid which I declined and recommended she  call Dr. Vear Clock' office, and she has done so and will see him today.  She is stable for discharge home.  She should remain on the clindamycin  for 10 days.      Shirley Friar, MD  Electronically Signed     VCS/MEDQ  D:  09/02/2006  T:  09/02/2006  Job:  [id number]   cc:   Fayrene Fearing L. Malon Kindle., M.D.  Fax:  [phone]   Jethro Bastos, M.D.  Fax: [phone]   Mark L. Vear Clock, M.D.  Fax: [phone]

## 2010-07-28 NOTE — Consult Note (Signed)
NAME:  Carolyn [name], Carolyn [name]                ACCOUNT NO.:  [id number]   MEDICAL RECORD NO.:  [id number]          PATIENT TYPE:  EMS   LOCATION:  MAJO                         FACILITY:  [facility]   PHYSICIAN:  John C. Madilyn Fireman, M.D.    DATE OF BIRTH:  Sep 18, 1955   DATE OF CONSULTATION:  11/21/2007  DATE OF DISCHARGE:  11/21/2007                                 CONSULTATION   CHIEF COMPLAINT:  Abdominal pain.   HISTORY OF PRESENT ILLNESS:  The patient is a 55 year old white female  with long complicated history of multiple GI problems mainly related to  chronic pancreatitis, who has generally been in her usual state of  health but requiring significant narcotic pain medicine on a routine  basis.  She presents with 3-4 day history of increasing periumbilical  and epigastric radiating-to-back pain.  She states that she ran out of  her Duragesic patch which she takes 100 mcg transdermally every 48  hours.  She ran out of this 3 days ago which is essentially when her  pain became worse.  She has an appointment with Dr. [name] in 3  days, but she felt that her pain was so severe she needed relief before  then.  She has had minimal nausea and vomiting about normal for her,  possibly a 5-pound weight loss in the last 3 or 4 weeks.  She was  evaluated here, and plain abdominal films showed a partial small bowel  obstruction pattern and I was called to assess her.  She has been  tolerating her tube feeds in the usual manner with 100 mL of Peptamen  for 8 hours at night and tolerating clear liquids and some low-fat  solids at about her normal or usual tolerance.  She denies any fever,  rectal bleeding, abdominal distention, urinary symptoms, or any other  particular symptoms above and beyond her baseline.  It is noteworthy  that on her last KUB here in June, her abdominal films were also  interpreted as showing either a small-bowel ileus or partial  obstruction, and she did not require admission at that  time.  She was  afebrile with stable vital signs, and her white blood cell count was  normal tonight.   PAST MEDICAL HISTORY:  1. History of chronic pancreatitis.  2. Remote history of biliary stricture.  3. History of seizures.  4. Chronic pain related pancreatitis.   SURGERIES:  Multiple operations including appendectomy, myomectomy,  cholecystectomy, Roux-en-Y, pseudocyst drainage, gastrojejunostomy,  attempted Whipple choledochojejunostomy, and exploratory surgery with  lysis of adhesions.   MEDICATIONS:  Roxicodone 5 mL per J-tube p.r.n., Phenergan 25-50 mg q.6  h. p.r.n., Elavil 50 mcg nightly, fentanyl patch 100 mcg changed every 2  days.   SOCIAL HISTORY:  The patient currently denies drinking alcohol.  She is  disabled former [employer].   PHYSICAL EXAMINATION:  GENERAL:  Alert, oriented, pleasant in no acute  distress.  HEART:  Regular rate and rhythm without murmur.  ABDOMEN:  Soft, multiple surgical scars with normoactive bowel sounds.  There is mild diffuse tenderness without guarding or  rebound.   LABORATORY DATA:  Alkaline phosphate 153, AST 83, albumin at 3.2.  CMET  otherwise normal.  Lipase is 13.  Hemoglobin of 13.4, WBC 5300.  Urinalysis unremarkable.   IMPRESSION:  Increase in chronic pain with radiologic, but not  necessarily clinical picture of partial small bowel obstruction.  She  may have a somewhat of a chronic ileus related to pain medicines and  multiple surgeries causing the chronic abnormal KUB appearance, although  this is not certain.   PLAN:  The patient does not want admission and does not seem acutely ill  at this time.  She is requesting a refill of her Duragesic patch until  she can see Dr. Vear Clock in 3 days.  She is agreeable to make a followup  appointment with Dr. Randa Evens next week for further assessment as well  and was told to come to the emergency room if she develops abdominal  distention and increased  intolerance of her tube feeds or vomiting out  of proportion to her baseline.   DISCHARGE DIAGNOSES:  1. Chronic abdominal pain related to pancreatitis.  2. 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   CONDITION ON DISCHARGE:  Stable.           ______________________________  Everardo All. Madilyn Fireman, M.D.     JCH/MEDQ  D:  11/21/2007  T:  11/22/2007  Job:  [id number]   cc:   Fayrene Fearing L. Malon Kindle., M.D.  [facility]  Mark L. Vear Clock, M.D.

## 2010-07-28 NOTE — H&P (Signed)
[name], Carolyn [name]                ACCOUNT NO.:  [id number]   MEDICAL RECORD NO.:  [id number]          PATIENT TYPE:  INP   LOCATION:  1316                         [name]   PHYSICIAN:  Carolyn [name], M.D.   DATE OF BIRTH:  05/25/1955   DATE OF ADMISSION:  08/25/2006  DATE OF DISCHARGE:                              HISTORY & PHYSICAL   HISTORY OF PRESENT ILLNESS:  This is a 55 year old female with a history  of chronic pancreatitis and jejunostomy tube/PEG tube.  She is a chronic  pain patient but states that lately she has increased pain, nausea and  vomiting. She vomited once yesterday, once today with some black emesis,  increased distention and swelling in her abdomen.  She has noticed some  erythema around the PEG site and in her abdominal scars.  Her PEG site  is draining clear fluid.  Currently her bowel movements are normal.  Her  appetite is okay. Her primary care physician is [name].  Her GI physician is [facility] [name], and she is a chronic pain  patient followed by [name].   PAST MEDICAL HISTORY:  1. Hematemesis/gastritis September, 2007.  2. History of seizures.  3. Chronic pancreatitis secondary to history of alcoholism.  4. Empyema.  5. Questionable anorexia.  6. History of biliary stricture.   PAST SURGICAL HISTORY:  1. Appendectomy.  2. Uterine myomectomy.  3. Cholecystectomy.  4. Roux-en-Y.  5. Pseudocyst drainage.  6. Gastrojejunostomy.  7. Choledochojejunostomy for biliary stricture.  8. Exploratory laparoscopy with lysis of adhesions.  9. Lung surgery for previous pneumonia approximately 5 years ago.   CURRENT MEDICATIONS:  1. Prilosec.  2. Phenergan.  3. Fentanyl patch.  4. Elavil.  5. Roxicodone Elixir for pain breakthrough.   ALLERGIES:  She has a sensitivity to Demerol.   FAMILY HISTORY:  No gallbladder disease.  No colon cancer.  No IVD.  Her  father passed with alcoholic cirrhosis.   SOCIAL HISTORY:  Occasional alcohol.  No tobacco. No recreational drugs.  She is a disabled [name].   REVIEW OF SYSTEMS:  Painful to take a deep breath.  No cough.  Increased  fatigue.   PHYSICAL EXAMINATION:  IN GENERAL:  She is alert and oriented and in no  apparent distress.  She appears frail.  CARDIOVASCULAR:  Regular rate and rhythm with no murmurs, rubs, or  gallops appreciated.  LUNGS:  Clear to auscultation anteriorly with no wheezes, crackles or  rales appreciated.  ABDOMEN:  Thin, mildly firm, distended for this patient.  Erythema  around the PEG site.  Clear drainage mixed with small amount of black  material.   LABORATORY DATA:  Labs show a hemoglobin of 10.2, hematocrit 29.7, white  count 7.2, BUN 47, creatinine 2.47.  LFT's are within normal limits.  Her WB morphology shows a mild left shift with atypical cells.  Her  lipase is less than 10.  On rectal exam, she was guaiac positive with  yellow stool.  Acute abdominal x-ray was done today showing left lower  lobe pneumonia,  non-obstructive ileus.  The tip of her jejunostomy tube  is in the proximal jejunum where it belongs.   DR. Bernette [name] ASSESSMENT:  This is a mixed picture  with a) guaiac  positive stool, b) increased abdominal pain, c) some vomiting, d) left  lower lobe pneumonia on x-ray, e) possible PEG site infection, f)  dehydration with an elevated BUN and creatinine.   PLAN:  1. Dr. Matthias [name] will admit the patient to a regular bed at [name].  2. We have requested a consult by [name], Dr. Crista [name].  3. She will be treated for dehydration and pneumonia by the [facility].  4. Will ask for a wound ostomy consult tonight and PEG placement.      Carolyn Police, PA    ______________________________  [facility] [name], M.D.    MLY/MEDQ  D:  08/25/2006  T:  08/26/2006  Job:  [id number]   cc:   [facility] [name], M.D.  Fax:  [phone]   Llana Aliment. Malon Kindle., M.D.  Fax: [phone]   Corinna L. Lendell Caprice, MD   [name], M.D.  Fax: [phone]

## 2010-07-31 NOTE — Consult Note (Signed)
NAME:  Carolyn [name], Carolyn [name]                ACCOUNT NO.:  [id number]   MEDICAL RECORD NO.:  [id number]          PATIENT TYPE:  INP   LOCATION:  1610                         FACILITY:  [facility]   PHYSICIAN:  Juan-Carlos Monguilod, M.D.DATE OF BIRTH:  04-Dec-1955   DATE OF CONSULTATION:  12/07/2005  DATE OF DISCHARGE:                                   CONSULTATION   PROBLEM LIST:  1. Acute on chronic abdominal pain associated with chronic pancreatitis      flare up.  2. Chronic pancreatitis.      a.     Status post percutaneous endoscopic gastrostomy placement for       tube feedings.      b.     Malnutrition.  3. Upper gastrointestinal bleed, resolved. Esophagitis, gastritis per      esophagogastroduodenoscopy associated with alcohol use/abuse.  4. 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   RECOMMENDATIONS:  1. After a lengthy discussion with Carolyn [name], our goals are to try to      keep the agents for pain management that she has been treated with      prior to this admission. The patient has been followed by Dr. [name] for years now. The current Duragesic patch strength along with      occasional oxycodone elixir has been very successful in controlling the      patient's pain prior to this admission. Previously, our goal is to      continue these medications at this point as well as upon discharge.      After different options were proposed to the patient, we agree that      given the increased pain,we will go ahead and will increase the      Duragesic patch to 100 mcg every 48 hours and the oxycodone elixir to      20 mg every 4 hours for breakthrough pain. The change of these two      medications are based on the following:  The patient now is currently      able to  tolerate diet without experiencing nausea and vomiting; also,      the ongoing requirement of intravenous hydromorphone seen over the last      72 hours. We are not doing an exact adjustment on the Duragesic patch      and oxycodone liquid based on the intravenous Dilaudid that the patient      required over the last two days. Currently, the patient's pain is      improved, though it still would not be fully controlled with the      Duragesic patch at 75 mcg and the 5 mg of oxycodone. We expect that      within five to seven days the adjustment on these two pain medications      could be decreased down to their usual baseline strengths.  This      readjustment will be done by Dr. [name] upon discharge. As said      earlier, I am using a lesser adjustment on the Duragesic patch and      oxycodone compared to the 28 to 36 mg of IV hydromorphone that the      patient has been receiving over the last 48 hours. The patient also was      informed that if intractable chronic pain were to be present despite      adjustments on the current pain medications, other agents like      methadone may offer further benefit in the future. These decisions will      be addressed and considered by Dr. [name] upon discharge.   If the patient's abdominal pain were to be fully controlled by tomorrow and  able to tolerate p.o. intake, discharge from the hospital could be  considered by Dr. Bernette Redbird.   IMPRESSION:  We were asked by Dr. Matthias Hughs to see Carolyn [name] in regards to  her complaints of abdominal pain. Carolyn [name] is a pleasant 55 year old  female who was admitted to [facility] about five days ago with  increased abdominal pain and upper GI bleed. The upper GI bleed has  subsided. The workup revealed esophagitis and gastritis associated with  alcohol use. The increased abdominal pain is thought to be due to a chronic  pancreatitis flare up. The patient's Duragesic patch and  oxycodone were kept  at the same dosage as prior admission. Intravenous hydromorphone had been  used every four hours for breakthrough pain. Currently, the patient reports  improvement in abdominal symptoms. She is able to tolerate today a p.o. diet  with low fat. No evidence of nausea and vomiting. No diarrhea or  constipation. Despite her overall clinical improvement, the patient has been  asking for intravenous Dilaudid pretty much every four hours. Physically,  she does not look ill or in severe distress. No reports of hematemesis,  hemoptysis, melena, tarry stools, or bright red blood per rectum over the  last 48 hours. The pain is described at baseline a 4 to 5 on a scale of 0 to  10. This pain is located mostly in the epigastrium and radiating to the  back. When the pain exacerbates, she reports a pain of 7 to 8. Underlying  component of depression and perhaps emotional and social pain may explain  perhaps this exacerbation or current requirements of intravenous  hydromorphone. No dysuria. No hematuria. No shortness of breath or cough. No  focal weakness or swallowing problems.   PAST MEDICAL HISTORY:  As in problem list.   ALLERGIES:  None.   MEDICATIONS:  See MAR attached.   FAMILY HISTORY:  Her father died of alcoholism and cirrhosis. Otherwise  noncontributory.   SOCIAL HISTORY:  The patient is disabled. She has a long-standing partner  with who she lives with. She continues using alcohol sporadically. She  follows at the [facility] by Dr. [name]. She  denies smoking.   REVIEW OF SYSTEMS:  As in HPI.   PHYSICAL EXAMINATION:  VITAL SIGNS:  Afebrile. Blood pressure 122/66,  respiratory rate 18, pulse 90, oxygen saturation of 100% on room air.  HEENT:  Normocephalic, atraumatic. Nonicteric sclerae. Conjunctivae within  normal limits. PERRLA. EOMI. Funduscopic exam negative for papilledema or hemorrhages. Slight dry mucous membranes. Oropharynx  clear.  NECK:  Supple. No JVD. No bruit, lymphadenopathy or thyromegaly.  LUNGS:  Clear to auscultation bilaterally without crackles or wheezes. Fair  air movement bilaterally.  HEART:  Regular rate and rhythm without murmurs, rubs or gallops. Normal S1  and S2.  ABDOMEN:  Nontender, nondistended, bowel sounds were present. No  hepatosplenomegaly. There were multiple surgical scars, well healed, without  evidence of infection. There is a G tube in place with a J tube threaded  through it. No hepatosplenomegaly. No guarding. No masses or bruits.  RECTAL:  Not done.  GENITOURINARY:  Within normal limits.  BREASTS:  Within normal limits.  EXTREMITIES:  No clubbing, cyanosis, or edema. Pulses 2+ bilaterally.  GENITOURINARY:  Alert and oriented x3. Strength 5/5 in all extremities. DTRs  3/5 in all extremities. Cranial nerves II-XII intact. Sensory intact.  Plantar reflexes downgoing bilaterally.   LABORATORY DATA:  Reviewed.   ASSESSMENT AND PLAN:  Chronic-acute abdominal pain. Currently, the abdominal  exam is fairly benign. It is unclear to me why still Carolyn [name] is  requesting and requiring 28 to 36 mg of intravenous hydromorphone.  Clinically, there has been a steady pattern of improvement. As I said  earlier, the abdominal exam is completely benign, and she is able to  tolerate today a diet. I suspect multifactorial pain which may include  social, emotional and spiritual pain in addition to the physical pain  associated with chronic pancreatitis. After a lengthy discussion with the  patient, we decided to increase the Duragesic patch to 100 mcg every 48  hours. We will stop intravenous Dilaudid and will increase the oxycodone  elixir to 20 mg every 4 hours for breakthrough pain. I will expect that  these changes should manage the patient's pain and allow her to be  discharged from the hospital. Further follow up at the [facility] with Dr. Vear Clock will be  needed short term. In terms of  how to readdress the current proposed pain management pain,  Dr. Vear Clock  will get further input. The hope is that within a few days both oxycodone  and Duragesic patch may be decreased back to baseline.   I spent about 120 minutes in this consultation.   Thank you for consulting palliative care medicine service which is part of  [facility].      Rosanne Sack, M.D.  Electronically Signed     JM/MEDQ  D:  12/07/2005  T:  12/09/2005  Job:  [id number]   cc:   Loraine Leriche L. Vear Clock, M.D.  Fax: [phone]

## 2010-07-31 NOTE — H&P (Signed)
[facility]  Patient:    Carolyn [name], Carolyn [name]                       MRN: [id number] Adm. Date:  [id number] Attending:  [name] CC:         Candy Sledge, M.D.  [name]. [name], M.D.  James L. Randa Evens, M.D.   History and Physical  FOLLOWUP EVALUATION  HISTORY OF PRESENT ILLNESS:  [name] comes in for follow-up evaluation of her chronic abdominal pain with previous history of recurrent pancreatitis.  Since her last evaluation she had done much better on her current dose of OxyContin 40 mg 2 x q.d. and states it is controlling her pain fairly well.  She notes that food does tend to exacerbate her discomfort.  She is fighting a bout of pneumonia and on Levaquin at present.  CURRENT MEDICATIONS: 1. Elavil 25 mg at night. 2. OxyContin 40 mg b.i.d.  PHYSICAL EXAMINATION:  VITAL SIGNS:  Blood pressure 137/74, heart rate 81, respiratory rate 18, O2 saturation 100%.  Pain level 4/10.  GENERAL:  The patient is well-groomed today with makeup on.  ABDOMEN:  Her abdominal exam reveals bowel sounds present with no appreciable tenderness.  IMPRESSION: 1. Chronic abdominal pain with history of pancreatitis status post multiple    surgical interventions. 2. Malnutrition secondary to pancreatitis and underlying eating disorder. 3. Other medical problems per primary care physician.  DISPOSITION: 1. Continue on OxyContin 40 mg 1 p.o. q.12h., #60 with no refill. 2. Continue with Elavil 25 mg 2 p.o. q.p.m., #60 with 3 refills. 3. Followup with me in eight weeks. DD:  07/27/00 TD:  07/27/00 Job: [id number]

## 2010-07-31 NOTE — Op Note (Signed)
NAME:  Carolyn [name], Carolyn [name] NO.:  [id number]   MEDICAL RECORD NO.:  [id number]          PATIENT TYPE:  INP   LOCATION:  0153                         FACILITY:  [facility]   PHYSICIAN:  Graylin Shiver, M.D.   DATE OF BIRTH:  02-May-1955   DATE OF PROCEDURE:  12/02/2005  DATE OF DISCHARGE:                                 OPERATIVE REPORT   PROCEDURE:  Upper gastrointestinal endoscopy.   ENDOSCOPIST:  Graylin Shiver, M.D.   INDICATIONS FOR PROCEDURE:  [age]-year-old female who had been  drinking alcohol and present to the emergency room with hematemesis.   Informed consent was obtained after explanation of the risks of bleeding,  infection and perforation.   PREMEDICATION:  Fentanyl 100 mcg IV, Versed 8 mg IV.   PROCEDURE:  With the patient in the left lateral decubitus position, the  Olympus gastroscope was inserted into the oropharynx and passed into the  esophagus; it was advanced down the esophagus, visualizing the esophageal  mucosa.  The proximal and mid-esophagus looked normal.  In the distal few  centimeters of the esophagus, the mucosa was very red and erosive,  compatible with the distal erosive esophagitis.  There was no evidence of  active bleeding at this time.  I did not appreciate a Mallory-Weiss tear.  The stomach showed findings of a partial gastrectomy.  There were some  patchy areas of erythema compatible with gastritis.  No significant  ulceration and no significant bleeding were seen in the stomach.  In the  stomach, a PEG button was seen; the surrounding area looked fine.  The PEG  button had a long jejunal feeding tube coming out of it which was going down  into the intestine.  I followed this for just a short ways.  The intestinal  area with the jejunal feeding tube in it looked normal.  I had did not  advance the scope very far because I did not want to risk attaching the tube  to the scope by friction and pulling it out.  She tolerated this  procedure  well without complications.   IMPRESSION:  1. Erosive distal esophagitis, which I believe is the source of the      patient's bleeding.  2. Gastritis, which was patchy.  3. Findings of partial gastrectomy with a percutaneous endoscopic      gastrostomy/percutaneous endoscopic jejunostomy noted.   PLAN:  Continue proton pump inhibitor.  Recheck labs in the morning           ______________________________  Graylin Shiver, M.D.     SFG/MEDQ  D:  12/02/2005  T:  12/04/2005  Job:  [id number]   cc:   Bernette Redbird, M.D.  Fax: [phone]   Llana Aliment. Malon Kindle., M.D.  Fax: [phone]

## 2010-07-31 NOTE — Discharge Summary (Signed)
Carolyn [name], Carolyn [name]                ACCOUNT NO.:  [id number]   MEDICAL RECORD NO.:  [id number]          PATIENT TYPE:  INP   LOCATION:  1610                         FACILITY:  [facility]   PHYSICIAN:  Shirley Friar, MDDATE OF BIRTH:  [birth year]   DATE OF ADMISSION:  12/02/2005  DATE OF DISCHARGE:                                 DISCHARGE SUMMARY   DISCHARGE DIAGNOSES:  1. Gastrointestinal bleed - resolved.  2. Erosive esophagitis (source of #1).  3. Chronic pancreatitis - stable.  4. Chronic pain secondary to previous number.  5. History of Roux-en-Y pseudocyst drainage.  6. History of gastrojejunostomy.  7. History of appendectomy.  8. History of choledochojejunostomy.  9. History of lysis of adhesions.  10.PEG -J-tube change.   DISCHARGE MEDICATIONS:  1. Roxicodone 20 mg per feeding tube q.3 hours as needed.  2. Fentanyl 100 mcg transdermal patch, change every 48 hours.  3. Phenergan 25 mg p.o. p.r.n.  4. Protonix 40 mg p.o. daily.  5. Elavil 25 mg p.o. q.h.s.  6. Peptamen in J-tube 1000 cc q.h.s. via pump.   CONSULTATIONS:  Palliative medicine.   HOSPITAL COURSE:  Ms. Nielsen was admitted on December 02, 2005 due to an  episode of coffee ground emesis that became bright red blood. She was  medically managed and was hemodynamically stable with a hemoglobin of 14.3.  She underwent upper endoscopy by Dr. Evette Cristal which showed erosive distal  esophagitis, gastritis, and PEG tube in place without any active bleeding.  She was medically managed with Protonix and her hemoglobin remained stable.  During her medical management she began having significant pain despite her  home doses of Roxicodone and Fentanyl which previously was Roxicodone 5 cc  every 4-6 hours as needed, and Duragesic 75 mcg transdermal patch to change  very 2 days.  Due to need for IV Dilaudid at increasing doses, a pain  management consult was obtained through palliative medicine who recommended  the changes  as stated above. Her pain was better controlled and on December 09, 2005 she was stable for discharge home. She was eating fine without any  nausea, vomiting or abdominal pain. Also during her hospitalization her PEG-  J was changed due to difficulty flushing it, and this was done on December 06, 2005 by interventional radiology.  She also requested that the type of  feedings that she receives be changed to a different company and this was  set up through case management.   DISPOSITION:  1. The patient is instructed to call Dr. [name] from Pain      Management and see him next week.  2. The patient is instructed to call Dr. Carman Ching at [facility] and to      see him in 2-3 weeks.   DISCHARGE DIET:  Low fat diet.  DISCHARGE ACTIVITY:  No restrictions.      Shirley Friar, MD  Electronically Signed     VCS/MEDQ  D:  12/09/2005  T:  12/10/2005  Job:  [id number]   cc:   Fayrene Fearing L. Malon Kindle., M.D.  Fax: [phone]   Mark L. Vear Clock, M.D.  Fax: [phone]

## 2010-07-31 NOTE — H&P (Signed)
[facility]  Patient:    Carolyn [name], Carolyn [name]                       MRN: [id number] Adm. Date:  [id number] Attending:  Orland Mustard CC:         [name], M.D.  [name]. [name], M.D.  James L. Randa Evens, M.D.   History and Physical  CHIEF COMPLAINT:  Nausea, vomiting, lower abdominal cramps.  HISTORY OF ILLNESS:  Patient is a 55 year old white female with a long history of chronic gastrointestinal problems including chronic pancreatitis, malnutrition, anorexia nervosa and multiple abdominal surgeries requiring long-term jejunostomy feedings.  She was last admitted to the hospital from June 06, 2000 to July 07, 2000 with a small-bowel obstruction which responded to conservative measures.  She was doing relatively well until two days ago, when she developed vomiting, which occurred yesterday, and she also developed lower abdominal distention and cramping discomfort, different from her usual chronic pancreatitis pain; she also noted she has not had a bowel movement in the last three or four days, which is unusual for her, and she was feeling weak and dizzy, with little p.o. intake today.  She vomited this morning what appeared to be food she had eaten the night before and presented to the emergency room.  She denies any hematemesis, melena, hematochezia, dysuria or fever.  PAST MEDICAL HISTORY: 1. Chronic alcoholism with chronic pancreatitis secondary to this. 2. History of alcohol withdrawal and withdrawal seizures. 3. History of chronic anorexia. 4. Chronic pancreatitis. 5. History of biliary stricture requiring choledochojejunostomy. 6. Narcotic addiction.  PAST SURGICAL HISTORY: 1. Gastrojejunostomy for feeding and decompression. 2. Choledochojejunostomy for bile duct stricture with Roux-en-Y anastomosis. 3. Tubal ligation. 4. Appendectomy.  MEDICATIONS: 1. OxyContin 40 mg b.i.d. 2. Elavil 25 mg q.h.s.  ALLERGIES:  None  known.  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  Lives with boyfriend, for several years.  She currently denies alcohol use.  FAMILY HISTORY:  Her father died of cirrhosis due to alcoholism.  Mother is in good health.  PHYSICAL EXAMINATION:  GENERAL:  Thin, pleasant white female in no severe distress.  VITAL SIGNS:  Blood pressure 98/50 with pulse 97, supine; blood pressure 98/51 with pulse 106, standing.  HEENT:  Remarkable for poor dentition.  No scleral icterus.  HEART:  Regular rate and rhythm without murmur.  LUNGS:  Clear.  ABDOMEN:  Soft, nondistended, with multiple surgical scars.  Decreased bowel sounds.  No hepatosplenomegaly, mass or guarding.  There is mild tenderness in the lower abdomen.  RECTAL:  Exam was deferred.  LABORATORY DATA:  Primarily remarkable for BUN of 53 and creatinine of 2.9. CBC and SMA-24 otherwise essentially within normal limits.  KUB shows no obvious obstruction or ileus but her jejunostomy tube tip has migrated into her esophagus.  IMPRESSION: 1. Nausea, vomiting and abdominal pain, rule out recurrent partial small-bowel    obstruction. 2. Migration of jejunostomy tube into the esophagus. 3. Azotemia presumed secondary to dehydration, rule out primary renal cause.  PLAN:  Admit, hydrate.  Will have radiology manipulate her jejunostomy tube and will have her on IV morphine until she can demonstrate tolerance of p.o. medicines.  Will recheck electrolytes in the morning and work up azotemia as needed if it does not respond to hydration. DD:  09/06/00 TD:  09/07/00 Job: [id number]

## 2010-07-31 NOTE — H&P (Signed)
[facility]  Patient:    Carolyn [name], Carolyn [name]                       MRN: [id number] Adm. Date:  [id number] Attending:  Orland Mustard CC:         [name], M.D.  [name]. [name], M.D.   History and Physical  REASON FOR ADMISSION:  Possible small-bowel obstruction.  HISTORY:  Nice 55 year old woman who has chronic pancreatitis, chronic abdominal pain, chronic malnutrition, seeing Dr. [name] now for pain control.  She just turned 45 yesterday and went out to eat.  She normally takes only liquids and this is supplemented at night by tube feeding, most recently with Pepcid, and she has been unable to tolerate anything else.  She went out last night and started having pain yesterday.  This progressed all through the day.  She vomited up undigested food.  Her abdomen has been distended.  We had her call our office with this history and the films done at [facility], acute abdominal series, showed early small-bowel obstruction with air-fluid levels and only a slight amount of gas in the colon and rectum and no free air.  With this history, she is admitted for further therapy.  CURRENT MEDICATIONS: 1. Elavil 25 mg q.h.s. 2. OxyContin 40 mg b.i.d.  ALLERGIES:  She has no drug allergies.  MEDICAL HISTORY: 1. Chronic alcoholism and multiple problems with drinking including    withdrawal, seizures, blackout spells, etc.  This has been an ongoing    problem and she has been in multiple places and has seen multiple people    for this but apparently this is doing much better now.  She has been    abstinent for some time. 2. History of chronic anorexia nervosa with documentation of sabotaging tube    feedings.  Due to her medical problems, she has been having to be    maintained on tube feeding and TNA.  She has been under therapy for    anorexia nervosa by Dr. Vilinda Flake.  She still continues to see him on an    intermittent basis.  It is felt that this  is probably under reasonably good    control. 3. Seizures, felt to be alcohol related in extensive past workup by    Dr. Deanna Artis. Hickling as well as at medical centers. 4. 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 [name]. [name] for    biliary stricture; she also had a gastrojejunostomy done in [place].    She has been evaluated for this problem at [facility] under the care of    Dr. Marty Heck, III and the general surgeons there as well as    ______ and ______ at [facility] and Dr. Kelli Hope. Pappas and    Dr. Joesph Fillers Branch at [facility].  Multiple studies have been done and it    has been determined there is nothing else really to offer her for her    chronic pancreatitis other than semielemental tube feeding and pain    control.  She was sent back to myself and Dr. [name]. [name] for    followup by all these individuals. 5. Her surgeries at this time include the stricture of the common bile duct    with a gastrojejunostomy, choledochojejunostomy for the bile duct stricture    with a Roux-en-Y anastomosis, surgically placed  gastrostomy tubes and    jejunostomy tubes with subsequent adhesions requiring a laparotomy,    attempted pancreatic surgery by Dr. Milana Kidney at [facility], with the patient    opened up, multiple adhesions lysed and it was determined that there was    nothing really to be done for her pancreas, a tubal ligation, a    hysterectomy and an appendectomy. 6. Severe malnutrition due to the problems listed above. 7. Narcotic addiction secondary to above problems. 8. Severe malnutrition, a chronic problem.  This has got additional    problems of osteoporosis, multiple fractures from falls, problems with    dentition and wasting of her teeth due to her malnutrition.  FAMILY HISTORY:  Her father died of cirrhosis due to alcoholism.  Mother is living and well.  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  REVIEW OF SYSTEMS:  Remarkable in that she has been switched over to Peptamen recently and this has done okay for her.  She has previously been on Criticare; they no longer handle that.  She has been unable to handle any tube feedings with any fat in them due to malabsorption.  This has not really responded to pancreatic enzyme replacement.  PHYSICAL EXAMINATION:  VITAL SIGNS:  Temperature 99.3, pulse 108, blood pressure 104/72.  GENERAL:  A very cachectic-appearing white female who actually appears better than she has in the past.  Her weight is 98 pounds.  She has been down as low as 70 pounds in the past several years and as high as 105, so she is really doing reasonably well for her.  HEENT:  Eyes:  Sclerae nonicteric.  Extraocular movements intact.  Throat: Mucous membranes are very dry.  Her teeth reveal evidence of absorption of her calcium.  NECK:  Supple.  No lymphadenopathy.  There is notable wasting of her supraclavicular area.  LUNGS:  Clear.  HEART:  Regular rate and rhythm without murmurs or gallops.  BREASTS:  Almost no breast tissue present.  ABDOMEN:  Distended, particularly in the lower abdomen, and somewhat tympanitic with mild non-localized tenderness.  A few bowel sounds are heard. The gastrostomy tube is in place and appears to look okay.  The site looks good.  RECTAL:  Stool is brown and heme-negative.  EXTREMITIES:  No edema.  ASSESSMENT: 1. Probable early small-bowel obstruction. 2. Severe malnutrition. 3. Multiple problems related to chronic pancreatitis, with surgeries as listed    above.  PLAN:  We will admit to [facility].  We will place her on suction  per her PEG.  We will have a PICC line  placed versus a central line.  We will obtain films in the morning and have Dr. [name] or one of his cohorts see her in consultation.  We will control her pain at this time with morphine, since Demerol was thought in the past maybe to contribute to her seizures. DD:  06/06/00 TD:  06/07/00 Job: [id number]

## 2010-07-31 NOTE — H&P (Signed)
[facility]  Patient:    Carolyn [name], Carolyn [name]                       MRN: [id number] Adm. Date:  [id number] Attending:  [name] CC:         Candy Sledge, M.D.  James L. Randa Evens, M.D.   History and Physical  NEW PATIENT EVALUATION:  Carolyn [name] is a very pleasant 55 year old who is sent to [place] by Candy Sledge, M.D. for evaluation and recommendations with regard to chronic abdominal pain.  The patient states that she was in her usual state of health up until she was in law school when she began drinking alcohol and developed acute pancreatitis. Subsequent to this, she has had a rather stormy course. She required a Roux-en-Y for a pseudocyst at that time and has had four subsequent surgeries ultimately leading to a percutaneous feeding jejunostomy tube. She is currently actively followed at the [facility] and is treated with OxyIR 5 mg two p.o. q.8h. She has been treated with celiac plexus blocks in the past with no lasting improvements. She is followed by [name], M.D. from her GI perspective. She has a history of seizures which sound related to excess Demerol dosing rather than alcohol withdrawal as she was taking 200 mg of Demerol p.o. every 2 to 4 hours at one point. Complicating her pancreatitis, she has had TPN prior to getting the feeding tube and does have a history of malnutrition and a history of alcoholism. She apparently has had some difficulties with opiates. Although she did not admit this on history, it is evident in Dr. Harriet Butte. Schmidts record. She does admit to occasionally taking extra doses. She knows when she runs out of the opiates she has to go the full month without the prescription. She has been taken off opiates at times, and she notes that her pain does worsen.  She currently describes her pain as sharp discomfort in the midsection which is exacerbated by eating fatty  foods. She is able to eat yogurts and breads occasionally but typically goes with an infusion feeding through the night. She has not had an acute bout of pancreatitis that she puts it in years. She has been treated in the past with Demerol, Roxicodone, Vicodin, and Darvocet, and is currently on Roxicodone 5 mg two p.o. q.8h. She has been seen by Dr. Wyline Mood at [facility] and was treated by Dr. Ashley Royalty at [facility] with opiates. She has had celiac plexus blocks with no overall improvement. She has apparently also been on morphine at one point.  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  She has been seen by Candy Sledge, M.D. for her neurologic evaluations, and there were some concerns over some behavioral aspects.  She is actively followed by [name], M.D.  CURRENT MEDICATIONS:  Prilosec, Creon, Roxicodone, Miacalcin.  ALLERGIES:  PECANS and WALNUTS.  PAST SURGICAL HISTORY:  Significant for myomectomy in 1980, appendectomy, BTL for endometriosis, and then the abdominal surgeries.  SOCIAL HISTORY:  The patient is a nonsmoker. She has not drunk alcohol in 4 years. She is not actively involved in AA. She is not currently practicing law and apparently has to go before a board review in order to get reinstituted.  ACTIVE MEDICAL PROBLEMS:  History of grand mal seizures which may be related to  alcohol withdrawal versus excess Demerol and ______ meperidine toxicity. She has a history of peptic ulcer disease.  FAMILY HISTORY:  Positive for alcoholism, cirrhosis, and breast cancer.  REVIEW OF SYSTEMS:  GENERAL:  Negative.  HEAD:  Negative. EYES:  Negative. NOSE, MOUTH, THROAT:  Negative. EARS: Negative. PULMONARY:  Negative. CARDIOVASCULAR:  Negative. GI:  See active medical problems and HPI. GU: Negative. MUSCULOSKELETAL:  Negative. NEUROLOGICAL:  Positive history of seizure in the past. No history of  stroke. HEMATOLOGIC:  Associated with changes from her malnutrition. Otherwise negative. CUTANEOUS:  Negative. ENDOCRINE:  The patient has a functional pancreas. PSYCHIATRIC:  The patient is currently being evaluated by Dr. Lodema Hong. ALLERGY/IMMUNOLOGIC:  Negative.  PHYSICAL EXAMINATION:  VITAL SIGNS:  Blood pressure is 119/63, heart rate is 83, respiratory rate is 18, O2 saturation is 99%, pain level is 4/10, and temperature is 97.8.  GENERAL:  This is a cachectic, thin female in no acute distress.  HEENT:  Head was normocephalic, atraumatic. Eyes:  Extraocular movements intact with conjunctivae and sclerae clear. She does have some slight ptosis of the right eye relative to the left. Nose:  Patent nares. Oropharynx: Demonstrated dental changes with cracked teeth and what to be thinning of the enamel. There were no mucosal lesions.  NECK:  Demonstrated carotids 2+ and symmetric.  LUNGS:  Clear.  HEART:  Regular rate and rhythm.  BREASTS, GENITALIA, AND RECTAL:  Not performed.  ABDOMEN:  Well-healed surgical scars with bowel sounds present. She had a feeding tube in the left upper quadrant.  BACK:  Intact gait. Negative straight leg raise signs.  EXTREMITIES:  Radial pulses and dorsalis pedis pulses were 2+ and symmetric.  NEUROLOGICAL:  The patient was oriented x 4. Cranial nerves 2 through 12 are grossly intact. Deep tendon reflexes were symmetric in the upper and lower extremities with downgoing toes. Motor was 5/5 with symmetric bulk and tone.  IMPRESSION: 1. Chronic abdominal pain status post multiple surgical interventions for    pancreatitis. Currently on opiate maintenance therapy. 2. Malnutrition secondary to pancreatitis and possible underlying eating    disorder. 3. History of alcoholism - currently sober. 4. History of seizures possibly related to ______ meperidine toxicity. 5. History of peptic ulcer disease. 6. History of endometriosis. 7.  Osteoporosis.   DISPOSITION: 1. I discussed with the patient treatment options which would include opiate    maintenance with the use of tricyclic antidepressants. I did not advocate    any further block interventions and personally find that blocks are not    very successful with chronic pancreatitis. I advocated using long-acting    opiates rather than short-acting opiates but advised her that she would    have to be compliant with the medications or she would be discharged from    our practice. I reviewed the potential side effects of both the opiates and    tricyclics. We will start her on OxyContin 20 mg one p.o. q.12h., #60 with    no refill and Elavil 25 mg one p.o. q.p.m., #30 with two refills. She will    sign a controlled substance contract today which will be reviewed with her    by the nurse. 2. Follow up with me in 4 weeks. 3. We will need to obtain records from Gillis L. Randa Evens, M.D. and Dr. ______    over at [facility]. DD:  05/19/00 TD:  05/19/00 Job: [id number]

## 2010-07-31 NOTE — H&P (Signed)
NAME:  Carolyn [name], Carolyn [name] NO.:  [id number]   MEDICAL RECORD NO.:  [id number]          PATIENT TYPE:  INP   LOCATION:  5034                         FACILITY:  [facility]   PHYSICIAN:  Bernette Redbird, M.D.   DATE OF BIRTH:  1955-06-09   DATE OF ADMISSION:  05/20/2006  DATE OF DISCHARGE:                              HISTORY & PHYSICAL   This 55 year old female is being admitted to the hospital because of  nausea, vomiting and abdominal pain.   Carolyn [name] is well-known to my partner, Dr. Randa Evens, and was admitted by me to  the hospital last September.  Her history is primarily significant for  alcohol-related chronic pancreatitis, although in recent years she has  not had to have many admissions.  In fact, her admission back in  September was the first time in 4 years and the admission prior to that  was for an empyema.  The September admission was for hematemesis.  Endoscopy at that time showed an erosive distal esophagitis.   With that background, the patient was in her usual state of health until  about 4 days ago when she began to have food intolerance characterized  by nausea and vomiting, and if she did not eat, bilious emesis.  In  addition, she had pain across her mid abdomen with radiation into the  back.  There was no problem with associated fevers or change in bowel  habits.  Also, in the patient's opinion, the current symptoms are not  reminiscent of her previous bowel obstructions when she would experience  significant bloating.  There has been no recent change in medications  and she denies alcohol for the past couple of months.  She is maintained  her typical bowel pattern of one loose bowel movement each day without  blood or melenic stools.   PAST MEDICAL HISTORY:  No known allergies.   OUTPATIENT MEDICATIONS:  1. Peptamen 1 liter nightly via J-tube.  2. Fentanyl patch 100 mcg changed every 2 days (managed by Dr. [name] [facility]).  3.  Roxicodone 5 mL q.6h. per J-tube.  4. Phenergan 25-50 mg q.6h.  5. Elavil 25 mg p.o. nightly.   OPERATIONS AND PAST MEDICAL HISTORY:  Please see my detailed dictated  history and physical dated December 02, 2005.  She has had multiple  operations including appendectomy, myomectomy, cholecystectomy, Roux-en-  Y pseudocyst drainage, gastrojejunostomy, attempted Whipple  choledochojejunostomy, exploratory surgery with lysis of adhesions and  gastrostomy tube placement.   MEDICAL ILLNESSES:  Include alcoholism, past anorexia nervosa, severe  pancreatitis, chronic pancreatitis, chronic pain, biliary stricture,  seizures of unclear etiology.   HABITS:  Lifelong nonsmoker.  Continued sporadic ethanol use.   FAMILY HISTORY:  Alcoholic cirrhosis in her father.  Otherwise negative  for GI illnesses such as gallbladder disease, colon cancer or IBD.   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   REVIEW OF SYSTEMS:  See HPI.  No chest pain, shortness of breath, joint  pain, skin rashes, reflux  symptomatology, fevers, melena, hematochezia.   PHYSICAL EXAM:  Current vital signs include temperature 98.9, blood  pressure 123/81, pulse 94, respirations 16.  HEENT: Anicteric.  No evident pallor.  CHEST:  Completely clear to auscultation.  HEART:  Slightly rapid rate with sinus arrhythmia consistent with  respiratory influence.  No murmurs or gallops appreciated.  ABDOMEN:  Scaphoid prominent surgical incision.  PEG tube in site with a  clean site without evident drainage.  Active bowel sounds which are  nonobstructive in character.  No guarding, mass or tenderness present.  NEUROLOGIC:  Grossly intact.  SKIN:  No overt lesions noted.   LABS:  Generally normal.  White count 6900, hemoglobin 15.5, platelets  249,000.  Differential shows 62 polys and 32 lymphs.  Chemistry panel  pertinent for normal sodium of 141,  low potassium of 3.4, BUN 12,  creatinine 0.85, AST 74, ALT 36, alkaline phosphate 136, albumin 3.9,  lipase 18.  Urinalysis clear with specific gravity of 1.007.   IMPRESSION:  1. Several-day history of nausea, vomiting and abdominal pain of      unclear etiology.  The differential diagnosis might include a small      bowel obstruction and flare of her pancreatitis (despite the normal      lipase) or a viral infection since a number of bugs seem to have      been going around town lately.  The patient's hemoglobin is      substantially higher than it was back in September at time of      hospital discharge when she had gastrointestinal bleeding (15.5      versus 11.8), and so although some this may reflect improvement in      blood production since her gastrointestinal bleed, I also wonder      about the possibility of hemoconcentration for dehydration, since      there has also been a rise in her albumin from 2.9-3.9 over the      same period.  2. Elevation of liver chemistries in the hepatocellular pattern      consistent with alcohol abuse.  She denies recent alcohol but I      question that.  3. Chronic pancreatitis with multiple adverse sequelae as described      above.   PLAN:  1. Hydration.  2. Follow labs, examine symptoms.  3. Obtain plain abdominal films.  4. Consideration for CT scan back and pelvis, especially if the      patient remains intolerant of resumption of tube feedings over the      next several days.  5. The patient will need a PICC line for adequate IV access while in      house.           ______________________________  Bernette Redbird, M.D.     RB/MEDQ  D:  05/21/2006  T:  05/21/2006  Job:  [id number]

## 2010-07-31 NOTE — H&P (Signed)
NAME:  Carolyn [name], Carolyn [name] NO.:  [id number]   MEDICAL RECORD NO.:  [id number]          PATIENT TYPE:  INP   LOCATION:  0153                         FACILITY:  [facility]   PHYSICIAN:  Bernette Redbird, M.D.   DATE OF BIRTH:  September 01, 1955   DATE OF ADMISSION:  12/02/2005  DATE OF DISCHARGE:                                HISTORY & PHYSICAL   HISTORY OF PRESENT ILLNESS:  This 55 year old female is being admitted to  the hospital because of GI bleeding and abdominal pain.   Hiilei has no prior history of GI bleeding.  She does have a very complex  medical history related to alcohol related complications, primarily relating  to the pancreas, details of which are summarized below.  However, there is  no history of alcohol-related portal hypertension.   As noted, she has never had GI bleeding prior to this.  She was drinking  again recently and last night around 1 a.m., vomited dark, coffee-grounds  material on several occasions, which then became more frankly bloody.  There  was no frank syncope, although she did get dizzy and may have transiently  fallen.   She is brought to the emergency room where she was hemodynamically stable  and her hemoglobin was 14.3, but in view of the above history and the  complexity of her medical condition, inpatient management was felt to be  warranted.   In recent years, the patient has actually had a fairly stable clinical  course.  Her most recent hospitalization was here in [place] about 4  years ago for an empyema which was managed by [facility].  Most of her GI problems  and procedures go back at least 10 years.  In addition to the vomiting, the  patient has had fairly significant mid to low abdominal pain which continues  to the present time.   ALLERGIES:  No known allergies.   OUTPATIENT MEDICATIONS:  1. Duragesic patch 75 mcg per hour, changed every 2 days.  2. Elavil 25 mg nightly.  3. Phenergan 25 mg orally p.r.n. nausea.  4.  Roxicodone 5 mL per J-tube roughly every 4-6 hours p.r.n. for pain.  5. Peptamen tube feeding per J-tube 1000 mL nightly.   PAST SURGICAL HISTORY:  The patient has had multiple medical procedures.  She has had an appendectomy, a uterine myomectomy, a cholecystectomy, a Roux-  en-Y pseudocyst drainage procedure, a gastrojejunostomy performed during an  intended Whipple which was apparently not felt to be technically feasible  when performed at [facility], a choledochojejunostomy for a biliary  stricture, exploratory surgery with lysis of adhesions by Dr. Milana Kidney at  [facility] who apparently felt nothing further surgically could be done for her  pancreas and the patient has a gastrostomy tube with jejunal tube threaded  through it for ongoing feedings.   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  The patient has also  had seizures possibly  related to alcohol and/or Demerol administration and there is a background  issue of apparent anorexia nervosa.  As noted above, no known history of  liver disease or portal hypertension, ascites or hepatic encephalopathy.   HABITS:  Nonsmoker, alcohol use continues at least on a sporadic basis.  She  is followed at the [facility].   FAMILY HISTORY:  Her father died of alcoholic cirrhosis, but her family  history is otherwise negative including no gallbladder disease, colon cancer  or inflammatory bowel disease.   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   REVIEW OF SYSTEMS:  This was not obtained in detail, but please see HPI for  current problems.   PHYSICAL EXAMINATION:  GENERAL:  A very pleasant, fully alert,  coherent,  articulate, Caucasian female in no acute distress whatsoever.  VITAL SIGNS:  Blood pressure 124/55, pulse 91  HEENT:  She is anicteric and has a slightly fair complexion, but no frank  pallor.  The skin is warm.   CHEST:  Clear to auscultation.  HEART:  No gallops, rubs, murmurs, clicks or arrhythmias.  ABDOMEN:  No bowel sounds heard.  G-tube in place with J-tube threaded  through it.  No palpable organomegaly.  The abdomen is petite, rather firm  but without guarding, without mass or tenderness.  RECTAL:  Shows a small amount of brown stool, Hemoccult negative (controls  reacted appropriately), no fecal impaction.   LABORATORY DATA AND X-RAY FINDINGS:  Labs white count 16,400 with 88 polys  and 8 lymphs, hemoglobin 14.3 with an MCV of 92, platelets 251,000.  Pro  time not obtained.  Chemistry panel pertinent for BUN 19 with creatinine of  1.1.  Liver chemistries consistent with alcohol exposure including normal  bilirubin of 0.6, minimal elevation of Alk phos at 120, AST elevated at 76,  ALT 32.  Albumin is 3.4.  Urine pregnancy test is negative.  Urinalysis  clear.   IMPRESSION:  1. Nondestabilizing upper gastrointestinal bleed, most likely secondary to      alcoholic gastritis or a Mallory-Weiss tear.  This is the patient's      first ever gastrointestinal bleed.  2. Active alcoholism.  3. Sequelae of pancreatitis including chronic pancreatitis, chronic pain,      history of pseudocyst, status post multiple operations including      choledochojejunostomy for biliary stricture, gastrojejunostomy during      intended Whipple.  4. Feeding problems with history of anorexia nervosa on J-tube feedings.  5. History of empyema complicating recurrent pneumonia.  6. History of osteoporosis.  7. Seizures.   PLAN:  1. IV fluids, anti-peptic therapy and monitor labs in the standard fashion      for GI bleeder. 2. Early endoscopic evaluation, the purpose and risks of which  were      reviewed with the patient.  3. For now, we will continue the patient's home pain management regimen      with limited use of supplemental IV Dilaudid trying to take care to      avoid excessive administration of narcotics in this patient who has      chronic pain issues.  At this time, her low to mid abdominal pain does      not appear to be leading to significant distress nor is      it associated with  significant findings on exam and there is no      clinical indication of peritonitis, so it will not be addressed as a      separate problem unless it progresses in which case CT scanning might      be warranted.  4. Continue home tube feeding regimen, but we will keep the patient n.p.o.      for now in anticipation of endoscopy.           ______________________________  Bernette Redbird, M.D.     RB/MEDQ  D:  12/02/2005  T:  12/03/2005  Job:  [id number]   cc:   Fayrene Fearing L. Malon Kindle., M.D.  Fax: [phone]

## 2010-07-31 NOTE — Op Note (Signed)
NAME:  Carolyn [name], Carolyn [name]                          ACCOUNT NO.:  [id number]   MEDICAL RECORD NO.:  [id number]                   PATIENT TYPE:  INP   LOCATION:  2312                                 FACILITY:  [facility]   PHYSICIAN:  Salvatore Decent. Dorris Fetch, M.D.         DATE OF BIRTH:  [birth year]   DATE OF PROCEDURE:  12/08/2001  DATE OF DISCHARGE:                                 OPERATIVE REPORT   PREOPERATIVE DIAGNOSIS:  Left empyema.   POSTOPERATIVE DIAGNOSIS:  Left empyema.   PROCEDURES:  1. Bronchoscopy.  2. Left video-assisted thoracoscopy followed by thoracotomy and     decortication.   SURGEON:  Salvatore Decent. Dorris Fetch, M.D.   ASSISTANT:  Levin Erp. Steward, P.A.   ANESTHESIA:  General.   FINDINGS:  At bronchoscopy there was extrinsic compression of the left  lingular bronchus.  Biopsies, brushings, and washings were obtained.  No  endobronchial mass was identified.  With VATS, there was inadequate  visualization.  At thoracotomy there was a large, approximately 2 L  loculated effusion with adhesions, extensive fibrinous exudate.  The  effusion was turbid.  Gram stains and cultures are pending.  A good result  with decortication with good re-expansion of both left lower and left upper  lobes.   CLINICAL NOTE:  The patient is a 55 year old female who has had longstanding  issues with chronic pancreatitis.  She has had recurrent pneumonias over the  past three to four years, most recently in February of this year.  Approximately a week and a half prior to this admission, she began feeling  poorly with fevers, malaise, and some difficulty breathing.  She had a chest  x-ray performed, which showed a large left pleural effusion.  A CT scan  showed a complex loculated effusion consistent with an empyema in the left  chest.  No mass lesions were identified.  The patient was advised to undergo  bronchoscopy and then possible VATS, possible thoracotomy for decortication  and evacuation of  the pleural effusion.  The indications, risks, benefits,  and alternatives were discussed in detail with the patient and are outlined  in the patient's notes.  She understood and accepted the risks and agreed to  proceed.   DESCRIPTION OF PROCEDURE:  The patient was brought to the preop holding area  on 12/08/01.  Intravenous access was obtained and arterial blood pressure  monitoring catheter was placed.  The patient was taken to the operating  room, anesthetized, and intubated with a single-lumen endotracheal tube.  Flexible fiberoptic bronchoscopy was performed.  There were no endobronchial  lesions seen, but there did appear to be some extrinsic compression at the  left lingula bronchus.  Biopsies were taken in this area, and brushings and  washings were performed as well.   Next the patient was reintubated with a double-lumen endotracheal tube.  A  Foley catheter was placed, intravenously antibiotics were administered, and  the patient was  placed in a right lateral decubitus position.  The left  chest was prepped and draped in the usual fashion.  An incision was made in  the seventh intercostal space in the midaxillary line.  The patient was  cachectic, and there was little thoracic musculature.  The chest was entered  bluntly using a hemostat.  A thoracoscope was placed.  There was extremely  poor visualization due to extensive effusion.  This was loculated and  despite evacuating the fluid, which was sent for cultures and cytology,  there was extensive fibrinous exudate which limited visualization within the  chest cavity.  Therefore, a posterolateral thoracotomy was performed.  This  was a limited thoracotomy.  The latissimus muscle was divided.  The serratus  muscle was spared.  The fifth rib was shingled to allow improved exposure.  With this exposure it could be seen that there was extensive inflammatory  change within the chest.  There was extensive peel on both the visceral  and  parietal pleural surfaces.  There were multiple areas of loculated effusion.  This was primarily in the lower two-thirds of the chest.  The apex was  relatively spared.  Adhesions were taken down, all fluid was evacuated, and  decortication was performed.  It was performed on the parietal pleura first,  and an excellent decortication result was obtained.  There was significant  capillary oozing from the chest wall with no major bleeding.  There were  extensive adhesions of the lower lobe to the diaphragm.  These were taken  down, and multiple loculated areas of fibrinous exudate and fluid were  evacuated from that area.  The lower lobe and lingula then were  decorticated.  The plane was developed carefully, and there was minimal  disruption of the visceral pleura.  After completing the decortication, the  lung was inflated and there was good re-expansion of both lobes.  The lung  was once again deflated and inspection was made for hemostasis.  The chest  was irrigated with 1 L of warm normal saline containing 1 g of vancomycin  and then irrigated additionally with an additional 2 L of warm saline.  The  lung was palpated, and there were no palpable masses within the lung  parenchyma.  Two 36 French chest tubes were placed through separate  incisions.  The original port site was used for a posterior tube and an  additional incision was made anteriorly, and an anterior tube was placed to  the apex through that incision.  The ribs were then reapproximated with #2  Vicryl pericostal sutures, and the serratus was reattached laterally with  running 0 Vicryl suture.  The latissimus was closed with a running 0 Vicryl  suture.  The subcutaneous tissue and skin were closed in a standard fashion.  Prior to closing the incision, intercostal blocks were performed with 0.25%  Marcaine at the completion of the procedure.  All sponge, needle, and instrument counts were correct at the end of the  procedure.  There were no  intraoperative complications.  The patient was taken from the operating room  to the postanesthetic care unit, extubated, and in a stable condition.                                               Salvatore Decent Dorris Fetch, M.D.    SCH/MEDQ  D:  12/08/2001  T:  12/11/2001  Job:  [id number]   cc:   Jethro Bastos, M.D.  [address]  [place]  [place] [address]  Fax: [phone]

## 2010-07-31 NOTE — Discharge Summary (Signed)
[facility]  Patient:    Carolyn [name], Carolyn [name]                       MRN: [id number] Adm. Date:  [id number] Disc. Date: 09/22/00 Attending:  [name] CC:         [name]. [name], M.D.  [name], M.D. at [facility]   Discharge Summary  ADMISSION DIAGNOSIS: Nausea, vomiting, abdominal pain in a woman with known adhesions, chronic intermittent partial small bowel obstruction.  FINAL DIAGNOSES: 1. Peritonitis as manifested by small amount of free air, treated    conservatively with IV antibiotics, total nutrient admixtures, with    apparent resolution. 2. Probable perforation of small bowel or duodenum related to feeding    tube placement, resolved with conservative therapy. 3. Chronic pancreatitis. 4. History of anorexia nervosa. 5. Chronic narcotic use for chronic pain due to the chronic pancreatitis. 6. Status post laparotomy with lysis of adhesions and small bowel obstruction. 7. 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 8. Chronic malnutrition related to all of the other problems requiring chronic    jejunostomy feeding.  PERTINENT HISTORY:  A 55 year old white female with long history of chronic pancreatitis, severe malnutrition, anorexia nervosa, multiple abdominal surgeries as listed above. She has had several bouts of small bowel obstruction in the past, one of which required surgery with lysis of multiple adhesions. She is known to have previous appendectomy. She has required chronic nocturnal feeding with elemental diet and chronic narcotics and is followed at the pain clinic by Dr. [name] for management of her narcotics and chronic pain. She presented with poor p.o. intake and vomiting.  ADMISSION MEDICATIONS:  OxyContin 40 mg b.i.d. and Elavil 25 mg q.h.s.  ALLERGIES:  No known drug  allergies.  PHYSICAL EXAMINATION:  Patient was afebrile. Blood pressure 98/51, pulse 106, standing. Exam remarkable for very poor dentition, marked malnutrition. Heart and lung exam was normal. Abdomen was soft, nondistended with multiple surgical scars, mild tenderness. Pertinent labs revealed a BUN of 53 and a creatinine of 2.9. For more details, please see dictated admission history and physical.  HOSPITAL COURSE:  The patient was admitted to the hospital and given IV fluids. It was felt that she was dehydrated from vomiting. She underwent radiographic replacement of her jejunostomy tube. It appeared to have migrated with the tip in the esophagus from vomiting. The tube was exchanged. The patient was markedly azotemic. A renal ultrasound showed no obstruction to flow of urine. Patient had no hydronephrosis on ultrasound. She did have urinalysis done which showed trace leukocyte esterase, hematuria, and pyuria. It was felt that she probably had a urinary tract infection. She continued to complain of lower abdominal pain. She was empirically started on antibiotics. She had problems with pain and started on PCA with morphine. Morphine made her feel somewhat funny and we therefore changed it to Demerol. This was felt to be adequate due to her long-term narcotic use. She was started on feeding with Peptamen as she took at home. She was given KCl elixir through the J-tube due to hypokalemia.  She continued to complain of progressive abdominal pain, and CT scan of the abdomen and pelvis was ordered. This showed free intraperitoneal air, a small amount over the liver. There was thickening of the duodenum which appeared fuzzy and the feeling was  that there had been a perforation in this area. The patient was seen in consultation by Dr. Cicero Duck who felt that the patient more than likely had had a perforation with sealing off of the site. It was clear at this point that the urinary tract  infection was not what was causing her pain, and we therefore switched her to Primaxin IV and Flagyl. Blood cultures were negative. The patient was started on TNA via [facility] line and it was felt after consultation with various surgical consultants that given her history of multiple surgeries and known severe adhesions, she more than likely had a micro perforation that had sealed off and could best be managed by the antibiotics and bowel rest. This was instituted. Initially, we did require suction on her jejunostomy tube; however, after four days or so of IV antibiotics and pain medicine, she felt much better. The vomiting and nausea resolved. She was following with the surgeons. White count returned to normal. All of the other problems such as hypokalemia and dehydration resolved with correction appropriately. The patient was continued on Primaxin and Flagyl. After approximately five days of n.p.o., IV pain medicine, and antibiotics, her abdomen was soft, she was passing some stool and air, the tenderness was much, much better. We went ahead with a water soluble upper GI and small bowel study via the gastrostomy tube and the jejunostomy tube showing the tip of the J-tube in the proximal jejunum with no evidence of a leak. There was no contrast extravasation seen. It was felt that the leak, from whatever source, had sealed off. The patient  was improving. After consultation with the surgical consultants, we started her back on clear liquids and jejunostomy feedings. She was maintained on this for several days without any increase in her pain. She was weaned off of the PCA Demerol pump and switched back to oral OxyContin and OxyIR which controlled her pain.  At the time of her discharge, she was tolerating low residue diet in small amounts, was ambulating. Her pain was maintained under decent control with OxyContin and OxyIR. She was receiving potassium replacement. Her jejunostomy feedings  with Peptamen were continued at night and she was eating some and  taking liquids during the daytime. Her IV fluids were stopped and she was able to maintain hydration, adequate input with no increase in pain. She had no fever. It was felt that at this point that the micro perforation had sealed off and been adequately treated.  DISPOSITION:  The patient is discharged home. She is aware that it is possible she could develop an abscess. Will need to watch carefully for further abdominal pain, fever, etc.  CONDITION:  Much improved.  TUBE FEEDING:  Peptamen 70 cc at night as she has before.  DISCHARGE MEDICATIONS: 1. Elavil 25 mg q.h.s. 2. Phenergan 50 mg p.r.n. for nausea. 3. OxyContin 40 mg one tablet b.i.d. 4. OxyIR 10 mg q.4h. p.r.n. 5. Protonix 40 mg daily. 6. KCl elixir 20 mEq daily per tube.  DISCHARGE FOLLOWUP:  She will follow up with Dr. Randa Evens in two to three weeks, will make arrangements for that. She will see Dr. Vear Clock within the next month. DD:  09/22/00 TD:  09/22/00 Job: [id number]

## 2010-07-31 NOTE — Discharge Summary (Signed)
NAME:  Carolyn [name], Carolyn [name]                          ACCOUNT NO.:  [id number]   MEDICAL RECORD NO.:  [id number]                   [name] TYPE:  INP   LOCATION:  2011                                 FACILITY:  [facility]   PHYSICIAN:  Salvatore Decent. Dorris Fetch, M.D.         DATE OF BIRTH:  [birth year]   DATE OF ADMISSION:  12/08/2001  DATE OF DISCHARGE:                                 DISCHARGE SUMMARY   ADMISSION DIAGNOSES:  1. Left empyema.  2. Chronic pancreatitis.  3. Chronic pain and chronic tube feedings secondary to #2.  4. History of alcohol abuse.   DISCHARGE DIAGNOSES:  1. Left empyema.  2. Chronic pancreatitis.  3. Chronic pain and chronic tube feedings secondary to #2.  4. History of alcohol abuse.  5. Postoperative anemia.   PROCEDURES:  Fiberoptic bronchoscopy; left thoracotomy, left decortication  12/08/01 Dr. Dorris Fetch.   BRIEF HISTORY:  Carolyn [name] is a 55 year old white female  who is referred  with a left empyema.  She has a complex medical history which includes  chronic pancreatitis.  Over Carolyn past year, she has had three or four  episodes of pneumonia.  Approximately  1 1/2 weeks ago she began developing  left-sided chest pain.  She had pain in both Carolyn left shoulder as well as  laterally in her lower rib cage.  It was exacerbated with deep breaths.  She  also developed fevers up to 101.6.  She was referred to [facility] and Dr. Charlett Lango.  he found Carolyn [name]  to have a massive left effusion  consistent with an empyema.  He noted that she was  tolerating it extremely  well  given Carolyn degree of atelectasis she must have with this effusion.  It  is his opinion that it had built up over time and she has developed a  compressive atelectasis which has never completely cleared and predisposed  her to recurrent pneumonia.  It was his opinion that she should undergo  bronchoscopy and then video-assisted thoracoscopy although most likely  require a thoracotomy to  completely decorticate her.  Carolyn risks and benefits  were discussed and she was admitted electively for surgery at this time.   PAST MEDICAL HISTORY:  Significant for chronic pancreatitis secondary to  previous alcohol use.  She no longer drinks. She has had five abdominal  surgeries, appendectomy and uterine myomectomy.  She has no history of  diabetes, hypertension, hypercholesterolemia or cardiac disease.   CURRENT MEDICATIONS:  1. Elavil 25 mg p.o. h.s.  2. Promethazine 25 mg p.o. q.6h p.r.n.  3. Duragesic 75 mcg per hour patch q.48h.  4. Tequin 400 mg q.d.  5. Hydrocodone one to two q.4h p.r.n.  for pain.   ALLERGIES:  She has no known allergies.   For further history and physical, please see Carolyn dictated note.   HOSPITAL COURSE:  Carolyn [name]  was admitted and taken to Carolyn operating room  and did in fact undergo a mini thoracotomy for total decortication.  She  tolerated Carolyn procedure well and returned to Carolyn intensive care unit in  satisfactory condition.  Carolyn [name]  is on chronic tube feedings.  She  takes Peptamen 1000 ml while sleeping via her tube feed.  This was  reinstituted after surgery.  Carolyn first postoperative day she was awake and  alert.  Hemoglobin was 8.3 with hematocrit of 27.8. White count was 12.2.  Platelets were 422,000.  She had a sodium of 129, potassium 3.2, chloride  77, CO2 27, BUN 12, creatinine 0.8 and a glucose 326.  Chest tubes were left  in.  She was treated with insulin for her hyperglycemia. Of note, her  glucose was 86 on admission.  Hemoglobin was down to 7.8 on 9/28, but she  was otherwise stable.  This required a pulmonary toilet and a large amount  of analgesic to control her pain.  She was transferred to stepdown on 9/29.  Her activity was slowly increased and she was ultimately transferred to Carolyn  floor.  She was tolerating her tube feedings well and was taking p.o. at a  minimal rate.  Carolyn cultures obtained grew out a microaerophilic  streptococci  and she was subsequently switched over to Keflex p.o.  By 12/16/02, PCA was  discontinued.  She continued to require oxycodone up to 75 mg yesterday on  12/16/01 along with 6 mg of  IV Dilaudid and her Duragesic patch for pain  control.  By 12/17/01, it was Dr. Zenaida Niece Trigt's opinion that she had progressed  adequately.  She was ambulating without difficulty.  Her pain seemed to be  under fairly adequate control.  She was hemodynamically stable.  It was his  opinion she could go home in Carolyn a.m.  She continued to complain of  epigastric pain which was not Carolyn same as her pancreatic discomfort and he  increased her Pepcid 20 mg b.i.d. to Protonix 40 mg q.d.  At this point, her  wounds are healing nicely.  She continues to take p.o.'s at her usual rate  and is on her tube feeding at 1000 ml every 24 hours which she does in Carolyn  evening.  Her prealbumin is 7.6.  Her last labs on 9/29 showed her  electrolytes with a sodium 131, potassium 4.5, chloride 99, CO2 27, glucose  164, BUN 7, creatinine 0.7, calcium was 7.5, total protein was 4.9, albumin  was 1.3.  LFTs were all normal.  White count was 16.8, hemoglobin was 8,  hematocrit was  23, platelets were 377,000.  We plan to update these labs in  Carolyn a.m. prior to discharge . At this point, we anticipate discharge in Carolyn  a.m. 12/18/01.   Discharge activity is as tolerated.  She will return to see Dr. Dorris Fetch  in two weeks with a chest x-ray from Carolyn [facility].  She is to contact Dr. Randa Evens  and Dr. Dorothe Pea for followup.  Clean her incision with plain soap and water.   DISCHARGE MEDICATIONS:  1. Elavil 50 mg p.o. h.s.  2. Keflex 500 mg p.o. t.i.d.  3. Protonix 40 mg q.d.  4. Duragesic 75 mg patch q.72h.  5.     Ferrous gluconate 300 mg p.o. b.i.d.  6. Folic acid 1 mg b.i.d.  7. Oxycodone 15 mg q.3h  and she was given 75 of these.    Eber Hong, P.A.  Salvatore Decent Dorris Fetch, M.D.    WDJ/MEDQ  D:   12/17/2001  T:  12/20/2001  Job:  [id number]   cc:   Jethro Bastos, M.D.  [address]  [place]  [place] [address]  Fax: [phone]   Llana Aliment. Malon Kindle., M.D.  [address]  [place]  [place] [address]  Fax: [phone]

## 2010-07-31 NOTE — Discharge Summary (Signed)
NAMECACI, Carolyn NO.:  [id number]   MEDICAL RECORD NO.:  [id number]          PATIENT TYPE:  INP   LOCATION:  1610                         FACILITY:  [facility]   PHYSICIAN:  Bernette Redbird, M.D.   DATE OF BIRTH:  December 14, 1955   DATE OF ADMISSION:  05/20/2006  DATE OF DISCHARGE:  05/26/2006                               DISCHARGE SUMMARY   DISCHARGE SUMMARY:   FINAL DIAGNOSES:  1. Abdominal pain, nausea and vomiting, probably due to mal location      of jejunostomy feeding tube.  2. History of chronic pancreatitis and multiple operations, with      chronic abdominal pain syndrome.  3. Mild elevation of liver chemistries.   CONSULTATIONS:  None.   COMPLICATIONS DURING ADMISSION:  None.   PROCEDURES:  PICC line placement, replacement and repositioning of  jejunal feeding tube.   LABORATORY DATA:  Admission white count 6900, repeat 7400; hemoglobin  15.5 on admission and 12.5 after hydration; platelets 249,000.  Differential count unremarkable.  Chemistry panel on admission pertinent  for potassium of 3.4 (4.5 on repeat) BUN 12, creatinine 0.85.  Liver  chemistries on admission included AST 74, ALT 36, alk phos 136 and total  bilirubin 0.8.  Lipase was normal on admission at 18.  Repeat liver  chemistries showed AST 73, ALT 23, alk phos 91, total bilirubin 1.3.  Urinalysis was clear.  Abdominal plain film on admission showed that the  patient's J tube was somewhat portal than the stomach and the tip of the  tube was just beyond the pylorus.   HOSPITAL COURSE:  Carolyn [name] was admitted through the emergency room when she  presented with significant abdominal pain, nausea, vomiting of several  days' duration, to the point of dehydration and weakness.  This is a  similar scenario to that which had been experienced on previous  admissions, but it had been a number of years since this had actually  required admission, and in fact, probably about 4 years ago when this  last occurred.    The patient was given IV hydration and intravenous medication for relief  of pain (Dilaudid 2 mg q.6 h) and nausea (Phenergan 25 mg IV q. 6).  This medicine was quite effective in controlling the patient's symptoms,  but due to an ongoing medication requirement, we started to look for  causes of the ongoing pain.  We were considering a CT scan, but then our  attention became focused on the fact that her J tube was somewhat out of  position and discussion with the interventional radiologist who has  replaced it on many occasions, Dr. Irish Lack, suggested that when  it comes out of position, the patient often becomes symptomatic.  He  knows her case well.  This led to the impression that perhaps the  patient developed some low grade pancreatic inflammation or discomfort  when the tube feeding that she receives on nightly basis is administered  in the proximal duodenum, where it can stimulate secretin receptors in  the small bowel, versus when the tube is in the proper location of  the  jejunum, in which case the tube feeding bypasses most of those receptors  and presumably leads to a lot less pancreatic stimulation.  With this in  mind, we arranged for the J tube to be replaced and repositioned which  was accomplished by interventional radiology.  Thereafter, the patient  seemed to really turn the corner and have a fairly abrupt improvement in  her symptoms.  Her tube feeding was able to be restarted and advanced to  her normal home regimen and this was well tolerated.  By the day of  discharge, the patient was pretty well controlled by medications similar  to those she uses at home, without ongoing need for intravenous  supplementation.  Accordingly, discharge home was felt to be clinically  appropriate.   DISPOSITION:  The patient is discharged home.  Her PICC-line will be  discontinued prior to discharge.  She may continue the clear liquid and  minimal low-fat  intake diet orally that she was taking at home, and she  will resume her Peptamen that is administered each night, typically 75  mL an hour for roughly 12 hours through the J tube.  She will follow up  with Dr. Randa Evens in the office on Monday, April 14 at 9:15 a.m. for  routine check since she has not seen in the office for sometime, and she  will resume her usual home medications.   MEDICATIONS ON DISCHARGE:  1. Roxicodone Elixir 5 ml per J tube every 6 hours as needed for pain.  2. Fentanyl patch 100 mcg per 24 hours, changed every 2 days.  3. Elavil 25 mg at bedtime.  4. Phenergan 25 mg orally every 6 hours as needed for nausea.  5. Pepcid AC on a p.r.n. basis.   Note that the patient is followed by Dr. [name] in the pain  clinic, who manages her ongoing prescription requirement.  She has a  visit coming up with him in approximately 10 days so, temporary  prescriptions to hold her over until then were provided for the  Roxicodone elixir (#2 1 ounce bottles) and the fentanyl patch (#5) as  well as for the Elavil and Phenergan (30 tablets each), all with no  refills.   CONDITION ON DISCHARGE:  Improved.           ______________________________  Bernette Redbird, M.D.     RB/MEDQ  D:  05/26/2006  T:  05/27/2006  Job:  [id number]   cc:   Loraine Leriche L. Vear Clock, M.D.  Jethro Bastos, M.D.  James L. Malon Kindle., M.D.

## 2010-07-31 NOTE — H&P (Signed)
[facility]  Patient:    Carolyn [name], Carolyn [name]                       MRN: [id number] Adm. Date:  [id number] Attending:  [name] CC:         Llana Aliment. Randa Evens, M.D.  [name]. [name], M.D.  Candy Sledge, M.D.   History and Physical  FOLLOW-UP EVALUATION:  [name] comes in for follow-up evaluation of her chronic abdominal pain with history of pancreatitis.  Since her last evaluation, she was hospitalized and hyperalimented as well as had her gut put to rest with a bowel obstruction, which is apparently nearly resolved.  Since her discharge, she has continued on OxyContin, but this has been increased to 40 mg twice a day.  She continues on Elavil 25 mg in the evening and Phenergan p.r.n.  She feels as though she is better on the 40 mg twice a day but has some episodes of breakthrough discomfort on this.  In some ways, she alluded to the fact that she felt better with the shorter-acting opiates.  I advised her that we really needed to give this a more protracted period of time to ascertain whether she benefits, and we may need to switch her over to Duragesic if there are issues of obstruction and the possibility that she would have to be taken off this again, since they had to use IV morphine to control her discomfort in the hospital.  PHYSICAL EXAMINATION:  VITAL SIGNS:  Blood pressure 109/81, heart rate is 119, respiratory rate 16, O2 saturation is 100%, pain level is 4 out of 10.  GENERAL:  She is awake and appropriate in her responses.  ABDOMEN:  Bowel sounds were active and present with no rushes.  Her abdomen was not tender to pressure with the stethoscope.  IMPRESSION: 1. Chronic abdominal pain, status post multiple surgical interventions for    pancreatitis. 2. Malnutrition secondary to pancreatitis and underlying eating disorder. 3. Other medical problems per primary care physician.  DISPOSITION: 1. Continue on OxyContin 40 mg one  p.o. q.12h., #60. 2. Continue with Elavil. 3. Continue Phenergan p.r.n. 4. Follow up with me in four weeks.  If she is not showing significant    stabilization, will consider switching over to Duragesic patches. DD:  06/29/00 TD:  06/30/00 Job: [id number]

## 2010-07-31 NOTE — Consult Note (Signed)
NAME:  Carolyn [name], Carolyn [name]                          ACCOUNT NO.:  [id number]   MEDICAL RECORD NO.:  [id number]                   PATIENT TYPE:  INP   LOCATION:  2011                                 FACILITY:  [facility]   PHYSICIAN:  Llana Aliment. Malon Kindle., M.D.          DATE OF BIRTH:  02/10/1956   DATE OF CONSULTATION:  12/18/2001  DATE OF DISCHARGE:                                   CONSULTATION   HISTORY OF PRESENT ILLNESS:  The patient is a 55 year-old who I have known  for a number of years.  He has had chronic pancreatitis secondary to  alcohol.  He has had a multitude of symptoms related to this.  In addition,  he has had chronic anorexia and it has been felt on psychological evaluation  to be chronic anorexia nervosa that dates back a number of years preceding  the pancreatitis.  She has unfortunately because of this developed narcotic  addiction and is followed in the pain clinic.  She has been through several  different pain clinics and currently Dr. [name] has been managing her  pain.  In addition to this, she has had multiple surgeries. She has had a  history of a biliary stricture requiring choledochojejunostomy, a Roux-en-Y  anastomosis to her bile duct.  At one time she had a surgical intent to do a  P-scope procedure, however, due to marked adhesions from previous  pancreatitis and the fact that her pancreas was shrunken, no procedure was  performed.  This was done at [facility].  She has had a tubal ligation and  an appendectomy.  Her course has been quite complicated. She has been  evaluated and admitted at [facility] in the GI department, at [facility] under the care of Dr. Ellis Savage and Dr. Veronia Beets as well as at [facility] under the  care of Dr. Bluford Main, addiction specialist there.  In short, there has been  no solution to her chronic pain and chronic pancreatitis and this has been  managed with an alimental diet with p.o. and p.r.n. intake and pain  medications per  the pain clinic. She is currently admitted with empyema.  She had this drained by Dr. Dorris Fetch.  She has had multiple previous  pneumonias.  She apparently is doing better, but is now having some  epigastric pain.  Her tube feedings seem to be going okay.   A chest x-ray shows that things appear to be much better.  Her abdomen does  show some dilated loops in the middle with a lot of feces that could be due  to early partial small bowel obstruction versus ileus.   PHYSICAL EXAMINATION:  GENERAL:  The patient appears to be quite well  nourished and frail.  HEENT:  Sclerae are non-icteric.  LUNGS:  Clear.  HEART:  Regular rhythm without murmurs, rubs or gallops.  ABDOMEN:  Slightly distended and soft.  The gastrostomy  tube is in place.    ASSESSMENT:  Vague abdominal pain which could be due to constipation due to  her narcotics versus small bowel obstruction.  We will discuss with Dr.  Dorris Fetch.  At this point I would try giving her some MiraLax and continue  her on her tube feedings and follow her along clinically.                                               James L. Malon Kindle., M.D.    Waldron Session  D:  12/18/2001  T:  12/20/2001  Job:  [id number]   cc:   Salvatore Decent. Dorris Fetch, M.D.  [address]  [place]  [place] [address]  Fax: [phone]   Jethro Bastos, M.D.  [address]  [place]  [place] [address]  Fax: [phone]

## 2010-07-31 NOTE — Discharge Summary (Signed)
[facility]  Patient:    Carolyn [name], Carolyn [name]                         MRN: [id number] Adm. Date:  06/06/00 Disc. Date: 06/17/00 Attending:  [name], M.D. CC:         [name]. [name], M.D.  [name], M.D.   Discharge Summary  REASON FOR ADMISSION:  Probable early small-bowel obstruction and multiple problems related to chronic pancreatitis, severe malnutrition, and multiple previous surgeries and a history of previous small-bowel obstruction.  FINAL DIAGNOSES: 1. Small-bowel obstruction secondary to adhesions, resolved. 2. Chronic pancreatitis. 3. Severe malnutrition. 4. History of chronic pain. 5. History of anorexia nervosa. 6. History of alcohol abuse. 7. Previous surgeries include resection of stricture of the common bile duct    with a choledochojejunostomy, a gastrojejunostomy, jejunostomy tube    placement with removal to the adhesions and laparotomy, history of    operation with multiple adhesions lysed, tubal ligation, hysterectomy, and    appendectomy. 8. Chronic severe malnutrition on chronic enteral tube feeding.  HISTORY OF PRESENT ILLNESS:  The patient has had multiple problems related to her chronic pancreatitis due to alcohol use and chronic malnutrition at least partially due to anorexia nervosa.  She is followed by Dr. [name] [facility].  She had a birthday the day before admission, went out to eat, and subsequent to that developed abdominal pain and bloating, vomited up undigested food.  We had films done at [facility] showing early small-bowel obstruction.  PHYSICAL EXAMINATION:  VITAL SIGNS:  Temperature 99.3, pulse 108, blood pressure 104/72.  GENERAL:  She is a cachectic appearing female who appeared much older than her age but did appear to look somewhat better than she had in the past.  HEENT:  Eyes clear and nonicteric.  Throat revealed evidence of absorption of her calcium.  NECK:  Supple with no  lymphadenopathy with marked wasting.  LUNGS:  Clear.  HEART:  Regular rate and rhythm without murmurs or gallops.  ABDOMEN:  Distended.  The lower abdomen was somewhat tympanic with mild diffuse tenderness.  RECTAL:  Stool brown and Hemoccult negative.  For more details, please see dictated admission and history and physical.  HOSPITAL COURSE:  The patient was admitted to a medical floor and seen in consultation by [name].  He felt that conservative therapy was indicated rather than in immediate operation, and the patient was kept n.p.o. A PICC line was placed, and the patient was started on enteral feeding.  she continued to require large quantities of pain medications, specifically Demerol and Phenergan.  She had been on large doses of OxyContin at home.  We subsequently started her on a morphine PCA analgesia at full dose, and this did barely control her pain.  Subsequent films were obtained that did show partial small-bowel obstruction which resolved very slowly.  The patient had no further vomiting, was reasonably comfortable, slightly tender.  We did clamp her tube.  We had used her PEG tube for suction, and we clamped it, and she was doing better with it clamped.  We had radiology change this to a J-tube, and this was set at the ligament of Treitz.  Tube feedings were begun. She was taking some oral feeding as well as the tube feeding, and she was weaned from the TNA via the PICC line.  The PCA pump and TNA were stopped. The patient was placed back on  her outpatient medications as given by Dr. Vear Clock.  She was discharged.  DISPOSITION:  The patient was discharged home by my partner, Dr. Luther Parody.  DISCHARGE MEDICATIONS: 1. OxyContin 40 mg b.i.d. 2. Elavil 25 mg q.h.s. 3. Phenergan 25 mg p.r.n. 4. Peptamen 75 mg at night via the jejunostomy tube.  She will stay on a low fate diet as tolerated.  Will continue to follow with Dr. [name] at the [facility]  and Dr. Randa Evens in 10 days, and she will call for an appointment. DD:  07/03/00 TD:  07/02/00 Job: [id number]

## 2010-12-04 LAB — LIPASE, BLOOD: Lipase: 11

## 2010-12-04 LAB — URINALYSIS, ROUTINE W REFLEX MICROSCOPIC
Bilirubin Urine: NEGATIVE
Hgb urine dipstick: NEGATIVE
Ketones, ur: NEGATIVE
Protein, ur: NEGATIVE
Urobilinogen, UA: 0.2

## 2010-12-04 LAB — COMPREHENSIVE METABOLIC PANEL
AST: 56 — ABNORMAL HIGH
CO2: 30
Calcium: 8.4
Creatinine, Ser: 0.76
GFR calc Af Amer: >60
GFR calc non Af Amer: >60
Total Protein: 6.3

## 2010-12-04 LAB — CBC
MCHC: 34.6
MCV: 90.3
Platelets: 261
RBC: 4.81
RDW: 12.7

## 2010-12-04 LAB — DIFFERENTIAL
Eosinophils Relative: 1
Lymphocytes Relative: 21
Lymphs Abs: 1.7

## 2010-12-09 LAB — COMPREHENSIVE METABOLIC PANEL
Albumin: 2.9 — ABNORMAL LOW
BUN: 21
Calcium: 7.4 — ABNORMAL LOW
Creatinine, Ser: 0.57
Total Protein: 5.8 — ABNORMAL LOW

## 2010-12-09 LAB — RAPID URINE DRUG SCREEN, HOSP PERFORMED
Barbiturates: NOT DETECTED
Benzodiazepines: NOT DETECTED
Cocaine: NOT DETECTED

## 2010-12-09 LAB — CBC
HCT: 32.3 — ABNORMAL LOW
MCV: 87.9
Platelets: 257
RDW: 18.8 — ABNORMAL HIGH

## 2010-12-09 LAB — DIFFERENTIAL
Basophils Absolute: 0
Lymphocytes Relative: 27
Monocytes Absolute: 0 — ABNORMAL LOW
Monocytes Relative: 0 — ABNORMAL LOW
Neutro Abs: 3.7

## 2010-12-09 LAB — ETHANOL: Alcohol, Ethyl (B): <5

## 2010-12-10 LAB — URINALYSIS, ROUTINE W REFLEX MICROSCOPIC
Glucose, UA: NEGATIVE
Hgb urine dipstick: NEGATIVE
Specific Gravity, Urine: 1.011
Urobilinogen, UA: 1

## 2010-12-10 LAB — DIFFERENTIAL
Basophils Relative: 1
Eosinophils Absolute: 0
Neutrophils Relative %: 73

## 2010-12-10 LAB — COMPREHENSIVE METABOLIC PANEL
ALT: 32
Alkaline Phosphatase: 174 — ABNORMAL HIGH
CO2: 27
Chloride: 104
GFR calc non Af Amer: >60
Glucose, Bld: 113 — ABNORMAL HIGH
Potassium: 3.3 — ABNORMAL LOW
Sodium: 144

## 2010-12-10 LAB — LIPASE, BLOOD: Lipase: 11

## 2010-12-10 LAB — CBC
HCT: 38.9
MCHC: 34.1
Platelets: 278
RDW: 13.8

## 2010-12-10 LAB — ETHANOL: Alcohol, Ethyl (B): 217 — ABNORMAL HIGH

## 2010-12-10 LAB — URINE MICROSCOPIC-ADD ON

## 2010-12-16 LAB — CBC
MCV: 90.9
Platelets: 188
RDW: 12.7
WBC: 5.3

## 2010-12-16 LAB — DIFFERENTIAL
Basophils Absolute: 0
Eosinophils Relative: 0
Lymphocytes Relative: 24
Monocytes Absolute: 0.1
Monocytes Relative: 1 — ABNORMAL LOW

## 2010-12-16 LAB — COMPREHENSIVE METABOLIC PANEL
AST: 83 — ABNORMAL HIGH
Albumin: 3.2 — ABNORMAL LOW
Chloride: 107
Creatinine, Ser: 0.76
GFR calc Af Amer: >60
Potassium: 3.6
Total Bilirubin: 0.7
Total Protein: 6.2

## 2010-12-16 LAB — URINE CULTURE: Colony Count: 15000

## 2010-12-16 LAB — URINALYSIS, ROUTINE W REFLEX MICROSCOPIC
Bilirubin Urine: NEGATIVE
Glucose, UA: NEGATIVE
Hgb urine dipstick: NEGATIVE
Specific Gravity, Urine: 1.013
Urobilinogen, UA: 0.2
pH: 6

## 2010-12-23 ENCOUNTER — Other Ambulatory Visit: Payer: Self-pay | Admitting: Gastroenterology

## 2010-12-23 DIAGNOSIS — R160 Hepatomegaly, not elsewhere classified: Secondary | ICD-10-CM

## 2010-12-23 DIAGNOSIS — K7460 Unspecified cirrhosis of liver: Secondary | ICD-10-CM

## 2010-12-25 ENCOUNTER — Ambulatory Visit
Admission: RE | Admit: 2010-12-25 | Discharge: 2010-12-25 | Disposition: A | Discharge status: Home / Self Care | Payer: Medicare Other | Source: Ambulatory Visit | Attending: Gastroenterology | Admitting: Gastroenterology

## 2010-12-25 DIAGNOSIS — K7460 Unspecified cirrhosis of liver: Secondary | ICD-10-CM

## 2010-12-25 DIAGNOSIS — R160 Hepatomegaly, not elsewhere classified: Secondary | ICD-10-CM

## 2010-12-30 LAB — BASIC METABOLIC PANEL
BUN: 2 — ABNORMAL LOW
BUN: 4 — ABNORMAL LOW
BUN: 4 — ABNORMAL LOW
BUN: 6
CO2: 27
CO2: 28
Calcium: 7.3 — ABNORMAL LOW
Calcium: 7.5 — ABNORMAL LOW
Calcium: 7.7 — ABNORMAL LOW
Calcium: 8 — ABNORMAL LOW
Chloride: 102
Chloride: 105
Creatinine, Ser: 0.58
Creatinine, Ser: 0.68
Creatinine, Ser: 0.76
GFR calc Af Amer: >60
GFR calc Af Amer: >60
GFR calc non Af Amer: >60
GFR calc non Af Amer: >60
GFR calc non Af Amer: >60
Glucose, Bld: 120 — ABNORMAL HIGH
Glucose, Bld: 82
Potassium: 3.1 — ABNORMAL LOW
Potassium: 3.8
Sodium: 136
Sodium: 137
Sodium: 138

## 2010-12-30 LAB — DIFFERENTIAL
Basophils Absolute: 0
Eosinophils Absolute: 0.1
Eosinophils Absolute: 0.2
Eosinophils Relative: 2
Lymphocytes Relative: 15
Lymphocytes Relative: 16
Lymphocytes Relative: 19
Lymphs Abs: 1.2
Lymphs Abs: 1.5
Monocytes Absolute: 0.4
Monocytes Absolute: 0.4
Monocytes Relative: 5
Monocytes Relative: 6
Neutro Abs: 5.7
Neutro Abs: 6.3
Neutrophils Relative %: 72
Neutrophils Relative %: 78 — ABNORMAL HIGH

## 2010-12-30 LAB — CBC
HCT: 28.5 — ABNORMAL LOW
HCT: 29.7 — ABNORMAL LOW
Hemoglobin: 10.2 — ABNORMAL LOW
Hemoglobin: 9.8 — ABNORMAL LOW
Hemoglobin: 9.8 — ABNORMAL LOW
MCHC: 34.4
MCV: 89.9
MCV: 90.5
MCV: 90.5
Platelets: 144 — ABNORMAL LOW
Platelets: 195
Platelets: 321
RBC: 2.83 — ABNORMAL LOW
RBC: 3.02 — ABNORMAL LOW
RBC: 3.16 — ABNORMAL LOW
RDW: 12.7
RDW: 12.9
WBC: 7.8
WBC: 8
WBC: 8.1
WBC: 9.1

## 2010-12-30 LAB — MAGNESIUM: Magnesium: 1.1 — ABNORMAL LOW

## 2010-12-30 LAB — PHOSPHORUS
Phosphorus: 2.2 — ABNORMAL LOW
Phosphorus: 3.5

## 2010-12-31 LAB — CBC
HCT: 26.4 — ABNORMAL LOW
Hemoglobin: 10.2 — ABNORMAL LOW
MCHC: 34.2
MCHC: 34.3
MCHC: 34.7
MCV: 90.4
MCV: 90.8
Platelets: 111 — ABNORMAL LOW
Platelets: 140 — ABNORMAL LOW
RBC: 3.27 — ABNORMAL LOW
RDW: 13.1
WBC: 7.2
WBC: 7.4

## 2010-12-31 LAB — URINALYSIS, ROUTINE W REFLEX MICROSCOPIC
Nitrite: NEGATIVE
Specific Gravity, Urine: 1.012
Urobilinogen, UA: 1

## 2010-12-31 LAB — COMPREHENSIVE METABOLIC PANEL
ALT: 15
ALT: 22
AST: 22
AST: 37
Albumin: 1.7 — ABNORMAL LOW
Albumin: 1.9 — ABNORMAL LOW
BUN: 14
CO2: 21
CO2: 23
Calcium: 8.1 — ABNORMAL LOW
Chloride: 109
Chloride: 97
Creatinine, Ser: 0.99
Creatinine, Ser: 2.47 — ABNORMAL HIGH
GFR calc Af Amer: 25 — ABNORMAL LOW
GFR calc Af Amer: 45 — ABNORMAL LOW
GFR calc non Af Amer: 21 — ABNORMAL LOW
GFR calc non Af Amer: 60 — ABNORMAL LOW
Glucose, Bld: 102 — ABNORMAL HIGH
Glucose, Bld: 99
Sodium: 134 — ABNORMAL LOW
Sodium: 135
Total Bilirubin: 0.8
Total Bilirubin: 1.1
Total Protein: 4.7 — ABNORMAL LOW

## 2010-12-31 LAB — DIFFERENTIAL
Basophils Absolute: 0
Eosinophils Absolute: 0.3
Lymphs Abs: 0.3 — ABNORMAL LOW
Neutro Abs: 6.2

## 2010-12-31 LAB — WOUND CULTURE

## 2010-12-31 LAB — CULTURE, BLOOD (ROUTINE X 2)

## 2011-02-22 IMAGING — XA IR REPLACE G-TUBE/COLONIC TUBE
1 series · 2 of 2 positions shown · non-contrast
Comparison: none

CLINICAL DATA: Balloon deflated

GASTROSTOMY CATHETER REPLACEMENT
Procedure: The existing gastrojejunostomy tube was exchanged over a
stiff Glidewire for a new 22-FrenchMic-Key gastrojejunostomy tube.
Contrast was injected opacifying small bowel.  9 ml saline was
insufflated into the balloon.

[Series 1000: run · 0.15mm/px · 2 of 2 slices shown]
[im 1/2]
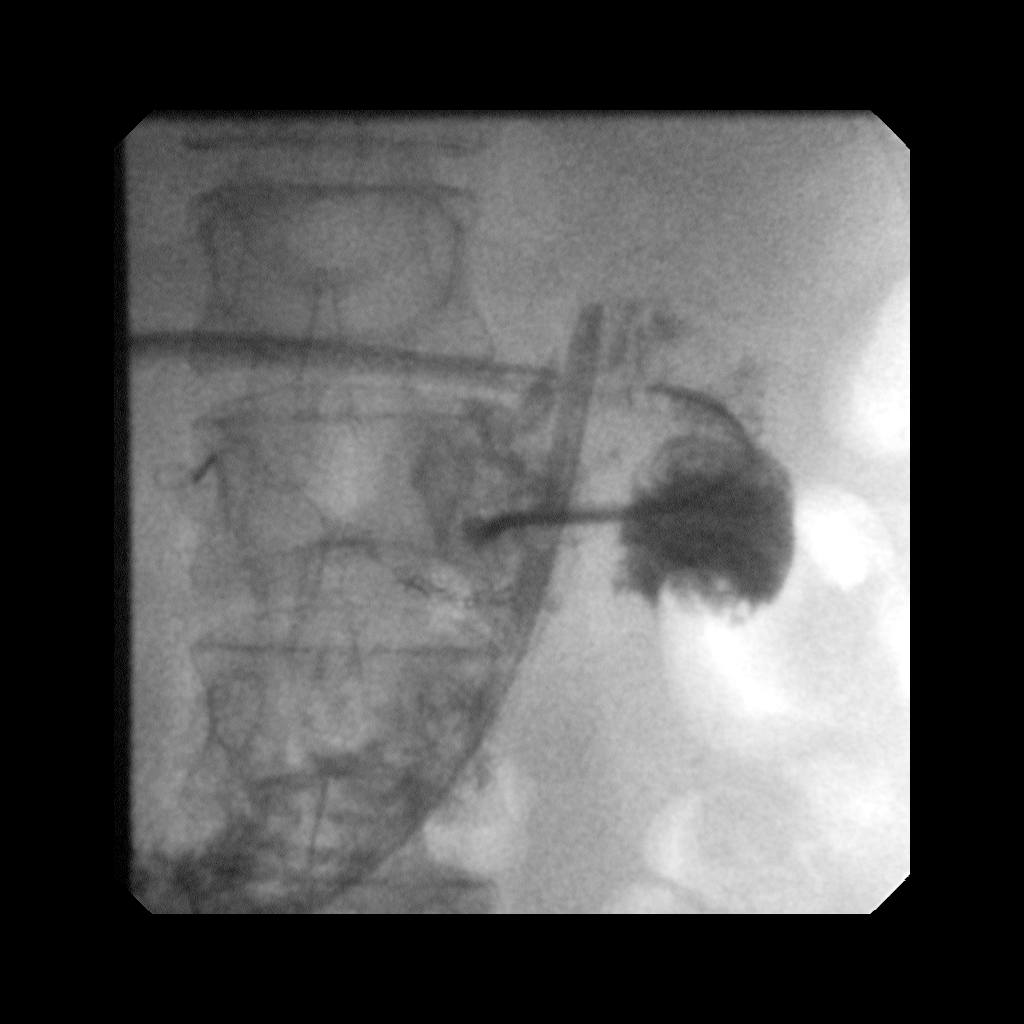
[im 2/2]
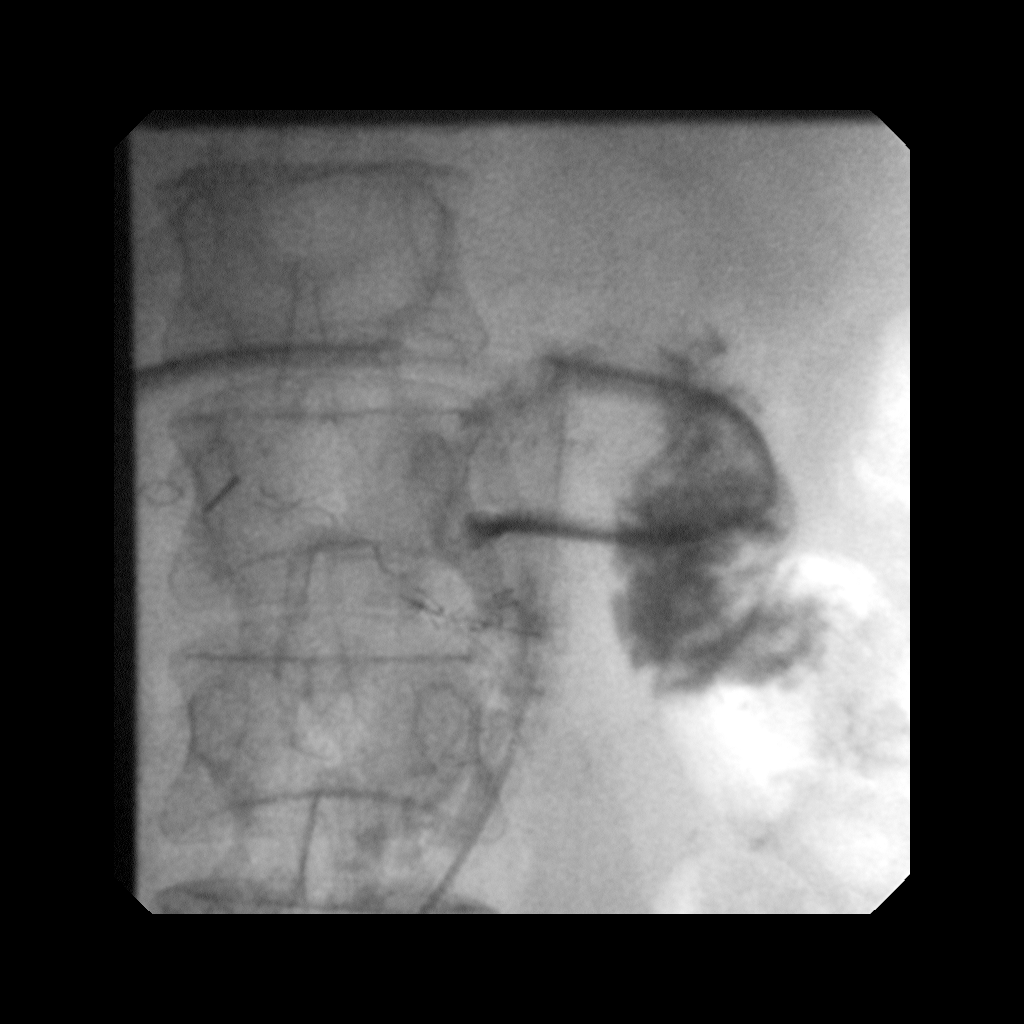

[2 of 2 positions shown; findings below may reference images not displayed]

FINDINGS: The tip of the jejunostomy tube is in the jejunum.
IMPRESSION: Successful gastrojejunostomy tube exchange.

## 2011-03-11 ENCOUNTER — Encounter: Payer: Self-pay | Admitting: Gynecology

## 2011-03-11 ENCOUNTER — Ambulatory Visit (INDEPENDENT_AMBULATORY_CARE_PROVIDER_SITE_OTHER): Payer: Medicare Other | Admitting: Gynecology

## 2011-03-11 VITALS — BP 110/70 | Ht 67.0 in | Wt 101.0 lb

## 2011-03-11 DIAGNOSIS — M949 Disorder of cartilage, unspecified: Secondary | ICD-10-CM

## 2011-03-11 DIAGNOSIS — K909 Intestinal malabsorption, unspecified: Secondary | ICD-10-CM

## 2011-03-11 DIAGNOSIS — N951 Menopausal and female climacteric states: Secondary | ICD-10-CM

## 2011-03-11 DIAGNOSIS — K859 Acute pancreatitis without necrosis or infection, unspecified: Secondary | ICD-10-CM | POA: Insufficient documentation

## 2011-03-11 DIAGNOSIS — N809 Endometriosis, unspecified: Secondary | ICD-10-CM | POA: Insufficient documentation

## 2011-03-11 DIAGNOSIS — K861 Other chronic pancreatitis: Secondary | ICD-10-CM | POA: Insufficient documentation

## 2011-03-11 DIAGNOSIS — M8580 Other specified disorders of bone density and structure, unspecified site: Secondary | ICD-10-CM

## 2011-03-11 DIAGNOSIS — Z23 Encounter for immunization: Secondary | ICD-10-CM

## 2011-03-11 DIAGNOSIS — R634 Abnormal weight loss: Secondary | ICD-10-CM

## 2011-03-11 DIAGNOSIS — R569 Unspecified convulsions: Secondary | ICD-10-CM | POA: Insufficient documentation

## 2011-03-11 DIAGNOSIS — R7989 Other specified abnormal findings of blood chemistry: Secondary | ICD-10-CM | POA: Insufficient documentation

## 2011-03-11 DIAGNOSIS — R82998 Other abnormal findings in urine: Secondary | ICD-10-CM

## 2011-03-11 LAB — COMPREHENSIVE METABOLIC PANEL
Albumin: 4.2 g/dL (ref 3.5–5.2)
CO2: 33 mEq/L — ABNORMAL HIGH (ref 19–32)
Glucose, Bld: 92 mg/dL (ref 70–99)
Potassium: 3.2 mEq/L — ABNORMAL LOW (ref 3.5–5.3)
Sodium: 140 mEq/L (ref 135–145)
Total Protein: 7.2 g/dL (ref 6.0–8.3)

## 2011-03-11 MED ORDER — ESTRADIOL 0.05 MG/24HR TD PTTW: 1.0000 | MEDICATED_PATCH | TRANSDERMAL | Status: DC

## 2011-03-11 MED ORDER — PROGESTERONE MICRONIZED 100 MG PO CAPS: 100.0000 mg | ORAL_CAPSULE | Freq: Every day | ORAL | Status: DC

## 2011-03-11 NOTE — Progress Notes (Signed)
Carolyn [name] 12-Jan-1956 [id number]        55 y.o.  for annual exam.  Has not been in several years. She has a complex history to include endometriosis, malabsorption syndrome, pancreatitis and seizure disorder.  She also has osteopenia with last bone density 2009 showing a T score of -2.4. She had been on Boniva previously but had stopped that. LMP 2 years ago. Had some hot flashes transiently but now they seem to have resolved. She now is having more mood swings, irritability, decreased libido, vaginal dryness.  Past medical history,surgical history, medications, allergies, family history and social history were all reviewed and documented in the EPIC chart. ROS:  Was performed and pertinent positives and negatives are included in the history.  Exam: chaperone present Filed Vitals:   03/11/11 1420  BP: 110/70   General appearance  Anorexic in appearance Skin grossly normal Head/Neck normal with no cervical or supraclavicular adenopathy thyroid normal Lungs  clear Cardiac RR, without RMG Abdominal  soft, nontender, without masses, organomegaly or hernia multiple well-healed incisional scars Breasts  examined lying and sitting without masses, retractions, discharge or axillary adenopathy. Pelvic  Ext/BUS/vagina  normal with atrophic changes  Cervix  normal    Uterus  anteverted, normal size, shape and contour, midline and mobile nontender   Adnexa  Without masses or tenderness    Anus and perineum  normal   Rectovaginal  normal sphincter tone without palpated masses or tenderness.    Assessment/Plan:  55 y.o. female for annual exam.    1. Postmenopausal. Emotional swings, irritability, decreased libido, vaginal dryness. Patient wanted to talk about HRT. I reviewed with her the issues to include the WHI study with increased risk for stroke, heart attack, DVT and increased risk of breast cancer. ACOG and NAMS statements the lowest dose for shortest period of time to treat symptoms only  was reviewed. After lengthy discussion she wants a trial of HRT. I think she would also benefit as far as bone protection although this is not a clear indication per ACOG. Discussed oral versus transdermal. Issues of first pass effect. She does have malabsorption syndrome she's been treated for pancreatitis I think transdermal would certainly be of benefit for the estrogen and will start with Vivelle 0.05 mg patches and Prometrium 100 mg bedtime nightly. She does give a history of nut allergies but on questioning she can and does eat peanuts and Prometrium should not be an issue.  If she has any issues after initiation or does any bleeding she is to call me. 2. Osteopenia. We'll check DEXA now as she is over 2 years from her last DEXA as well as a vitamin D level. Probable need for treatment was reviewed and the options to include Reclast to avoid GI exposure or Prolia was also discussed we'll rediscuss pending the DEXA results. 3. Breast health. She has not had a mammogram in several years she knows the importance of scheduling this and agrees to do so. SBE monthly reviewed. 4. Pap smear. She has no history of abnormal Pap smears in the past with her last Pap smear 2010 and numerous normal reports in her chart. Current screening guidelines were reviewed and we'll plan on less frequent screening with every three-year Pap smears and she agrees with this. No Pap smear was done today 5. GI health. Patient continue to follow up with Dr. Randa Evens who is her primary as far as her gastro-tract is concerned she'll follow up with him for colonoscopy and other  treatments as needed. 6. Gen. Health maintenance. I did order a CBC urinalysis comprehensive metabolic panel due to her osteopenia and malabsorption, anorexic state. I did ask her to make sure that she has a lipid profile, thyroid and any other laboratory studies as needed by her primary ordered through their office as with Medicare I am unable to screen her for  this and she understands and will follow up with her primary for this.    Dara Lords MD, 3:25 PM 03/11/2011

## 2011-03-11 NOTE — Patient Instructions (Signed)
Start hormone replacement as we discussed. If you do any bleeding or any other issues call me

## 2011-03-12 ENCOUNTER — Encounter: Payer: Self-pay | Admitting: Gynecology

## 2011-03-12 ENCOUNTER — Other Ambulatory Visit: Payer: Self-pay | Admitting: *Deleted

## 2011-03-12 DIAGNOSIS — R6889 Other general symptoms and signs: Secondary | ICD-10-CM

## 2011-03-12 LAB — VITAMIN D 25 HYDROXY (VIT D DEFICIENCY, FRACTURES): Vit D, 25-Hydroxy: 15 ng/mL — ABNORMAL LOW (ref 30–89)

## 2011-03-12 MED ORDER — CHOLECALCIFEROL 1.25 MG (50000 UT) PO TABS: 1.0000 | ORAL_TABLET | ORAL | Status: DC

## 2011-03-12 MED ORDER — SULFAMETHOXAZOLE-TMP DS 800-160 MG PO TABS: 1.0000 | ORAL_TABLET | Freq: Two times a day (BID) | ORAL | Status: AC

## 2011-03-12 NOTE — Progress Notes (Signed)
Addended by: Dara Lords on: 03/12/2011 09:00 AM   Modules accepted: Orders

## 2011-03-12 NOTE — Progress Notes (Signed)
Addended by: Dara Lords on: 03/12/2011 09:03 AM   Modules accepted: Orders

## 2011-03-16 DIAGNOSIS — M810 Age-related osteoporosis without current pathological fracture: Secondary | ICD-10-CM | POA: Insufficient documentation

## 2011-03-31 ENCOUNTER — Ambulatory Visit (INDEPENDENT_AMBULATORY_CARE_PROVIDER_SITE_OTHER): Payer: Medicare Other

## 2011-03-31 DIAGNOSIS — M8580 Other specified disorders of bone density and structure, unspecified site: Secondary | ICD-10-CM

## 2011-03-31 DIAGNOSIS — M810 Age-related osteoporosis without current pathological fracture: Secondary | ICD-10-CM

## 2011-04-01 ENCOUNTER — Telehealth: Payer: Self-pay | Admitting: Gynecology

## 2011-04-01 ENCOUNTER — Encounter: Payer: Self-pay | Admitting: Gynecology

## 2011-04-01 NOTE — Telephone Encounter (Signed)
Have patient make appointment to see me to discuss her bone density which shows osteoporosis.

## 2011-04-01 NOTE — Telephone Encounter (Signed)
Pt voicemail not set up.

## 2011-04-05 NOTE — Telephone Encounter (Signed)
Amy(CNA)spoke with pt regarding the below note.

## 2011-04-06 ENCOUNTER — Encounter: Payer: Self-pay | Admitting: Gynecology

## 2011-04-06 ENCOUNTER — Ambulatory Visit (INDEPENDENT_AMBULATORY_CARE_PROVIDER_SITE_OTHER): Payer: Medicare Other | Admitting: Gynecology

## 2011-04-06 DIAGNOSIS — M810 Age-related osteoporosis without current pathological fracture: Secondary | ICD-10-CM

## 2011-04-06 DIAGNOSIS — E559 Vitamin D deficiency, unspecified: Secondary | ICD-10-CM

## 2011-04-06 DIAGNOSIS — Z79890 Hormone replacement therapy (postmenopausal): Secondary | ICD-10-CM

## 2011-04-06 MED ORDER — ALENDRONATE SODIUM 70 MG PO TABS: 70.0000 mg | ORAL_TABLET | ORAL | Status: AC

## 2011-04-06 NOTE — Patient Instructions (Signed)
Start on Fosamax as we discussed, take extra vitamin D, call me in follow up of your hormone replacement.
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
## 2011-04-07 ENCOUNTER — Encounter: Payer: Self-pay | Admitting: *Deleted

## 2011-04-07 ENCOUNTER — Other Ambulatory Visit: Payer: Self-pay | Admitting: *Deleted

## 2011-04-07 MED ORDER — ESTRADIOL 0.05 MG/24HR TD PTTW: MEDICATED_PATCH | TRANSDERMAL | Status: DC

## 2011-04-07 NOTE — Progress Notes (Signed)
Patient ID: Carolyn [name], female   DOB: 07/19/1955, 56 y.o.   MRN: [id number] Pt rx for vivelle-dot 0.05 patch was sent incorrect. Pt said that TF told her place patch twice weekly not once weekly, new rx sent to pharmacy.

## 2011-06-06 ENCOUNTER — Other Ambulatory Visit: Payer: Self-pay

## 2011-06-06 ENCOUNTER — Emergency Department (HOSPITAL_COMMUNITY)
Admission: EM | Admit: 2011-06-06 | Discharge: 2011-06-07 | Disposition: A | Discharge status: Home / Self Care | Payer: Medicare Other | Attending: Emergency Medicine | Admitting: Emergency Medicine

## 2011-06-06 ENCOUNTER — Emergency Department (HOSPITAL_COMMUNITY): Payer: Medicare Other

## 2011-06-06 ENCOUNTER — Encounter (HOSPITAL_COMMUNITY): Payer: Self-pay | Admitting: *Deleted

## 2011-06-06 DIAGNOSIS — R51 Headache: Secondary | ICD-10-CM | POA: Insufficient documentation

## 2011-06-06 DIAGNOSIS — R05 Cough: Secondary | ICD-10-CM | POA: Insufficient documentation

## 2011-06-06 DIAGNOSIS — Z8719 Personal history of other diseases of the digestive system: Secondary | ICD-10-CM | POA: Insufficient documentation

## 2011-06-06 DIAGNOSIS — R059 Cough, unspecified: Secondary | ICD-10-CM | POA: Insufficient documentation

## 2011-06-06 DIAGNOSIS — Z79899 Other long term (current) drug therapy: Secondary | ICD-10-CM | POA: Insufficient documentation

## 2011-06-06 DIAGNOSIS — R079 Chest pain, unspecified: Secondary | ICD-10-CM | POA: Insufficient documentation

## 2011-06-06 DIAGNOSIS — E860 Dehydration: Secondary | ICD-10-CM

## 2011-06-06 DIAGNOSIS — R509 Fever, unspecified: Secondary | ICD-10-CM | POA: Insufficient documentation

## 2011-06-06 LAB — COMPREHENSIVE METABOLIC PANEL
Albumin: 3.9 g/dL (ref 3.5–5.2)
BUN: 21 mg/dL (ref 6–23)
Calcium: 9.3 mg/dL (ref 8.4–10.5)
Creatinine, Ser: 0.64 mg/dL (ref 0.50–1.10)
Total Protein: 7.1 g/dL (ref 6.0–8.3)

## 2011-06-06 LAB — CBC
HCT: 33.1 % — ABNORMAL LOW (ref 36.0–46.0)
MCHC: 33.5 g/dL (ref 30.0–36.0)
MCV: 97.1 fL (ref 78.0–100.0)
RDW: 12.2 % (ref 11.5–15.5)

## 2011-06-06 LAB — LIPASE, BLOOD: Lipase: 6 U/L — ABNORMAL LOW (ref 11–59)

## 2011-06-06 LAB — POCT I-STAT TROPONIN I: Troponin i, poc: 0.01 ng/mL (ref 0.00–0.08)

## 2011-06-06 LAB — DIFFERENTIAL
Basophils Absolute: 0 10*3/uL (ref 0.0–0.1)
Eosinophils Absolute: 0.1 10*3/uL (ref 0.0–0.7)
Lymphs Abs: 0.7 10*3/uL (ref 0.7–4.0)
Neutro Abs: 1.8 10*3/uL (ref 1.7–7.7)

## 2011-06-06 MED ORDER — SODIUM CHLORIDE 0.9 % IV BOLUS (SEPSIS)
1000.0000 mL | Freq: Once | INTRAVENOUS | Status: AC
  Administered 2011-06-06: 1000 mL via INTRAVENOUS

## 2011-06-06 MED ORDER — GI COCKTAIL ~~LOC~~
30.0000 mL | Freq: Once | ORAL | Status: AC
  Administered 2011-06-07: 30 mL via ORAL
  Filled 2011-06-06: qty 30

## 2011-06-06 MED ORDER — METOCLOPRAMIDE HCL 5 MG/ML IJ SOLN
10.0000 mg | Freq: Once | INTRAMUSCULAR | Status: AC
  Administered 2011-06-06: 10 mg via INTRAVENOUS
  Filled 2011-06-06: qty 2

## 2011-06-06 MED ORDER — DIPHENHYDRAMINE HCL 50 MG/ML IJ SOLN
25.0000 mg | Freq: Once | INTRAMUSCULAR | Status: AC
  Administered 2011-06-06: 25 mg via INTRAVENOUS
  Filled 2011-06-06: qty 1

## 2011-06-06 MED ORDER — SODIUM CHLORIDE 0.9 % IV SOLN
INTRAVENOUS | Status: DC
  Administered 2011-06-07: 100 mL/h via INTRAVENOUS

## 2011-06-06 NOTE — ED Provider Notes (Signed)
History     CSN: [id number]  Arrival date & time 06/06/11  2044   First MD Initiated Contact with Patient 06/06/11 2210      Chief Complaint  Patient presents with  . Chest Pain  . Fever    last night  . Back Pain  . Gastrophageal Reflux    feels like fist in her chest,  has been "burping" alot    (Consider location/radiation/quality/duration/timing/severity/associated sxs/prior treatment) HPI  Patiently states last night she started getting a headache and had fever up to 102. She indicates the headache was frontal and around the side of her head to the back. She had nausea and felt weak. She had chills. She relates she's felt better then about 2 PM she started getting some pressure in her central chest with a lot of burping. She states the pressure is constant. She's had nausea and took Zofran so she is not had any vomiting. She denies cough but states she's getting some mucus in her throat when she clears her throat she's not sure if it's coming from her lungs or her esophagus. She denies shortness of breath, sore throat, or sneezing. But she is having clear rhinorrhea. She states she doesn't normally have headaches.  Patient has a history of chronic pancreatitis and had a feeding tube until about 2 years ago. She goes to pain management.  PCP Dr. Carman Ching  Past Medical History  Diagnosis Date  . Endometriosis   . Malabsorption     MALABSORPTION SYNDROME  . Seizure   . Vitamin d deficiency 2012    VIT D 15  . Chronic pancreatitis   . Osteoporosis 03/2011    t score -2.5  . Pancreatitis     due to cyst and tumors due to calcifications    Past Surgical History  Procedure Date  . Jejunostomy feeding tube 1990  . Multiple gastric surgeries   . Myomectomy   . Tubal ligation   . Appendectomy   . Feeding tube relocation 2010    Family History  Problem Relation Age of Onset  . Cancer Father     BONE  . Breast cancer Maternal Grandmother     History    Substance Use Topics  . Smoking status: Never Smoker   . Smokeless tobacco: Never Used  . Alcohol Use: No  retired Geophysicist/field seismologist [employer]  OB History    Grav Para Term Preterm Abortions TAB SAB Ect Mult Living   0               Review of Systems  All other systems reviewed and are negative.    Allergies  Peanut-containing drug products  Home Medications   Current Outpatient Rx  Name Route Sig Dispense Refill  . ALENDRONATE SODIUM 70 MG PO TABS Oral Take 1 tablet (70 mg total) by mouth every 7 (seven) days. Take with a full glass of water on an empty stomach. 4 tablet 11  . ASPIRIN EC 81 MG PO TBEC Oral Take 162 mg by mouth once.    . ASPIRIN-ACETAMINOPHEN-CAFFEINE 250-250-65 MG PO TABS Oral Take 2 tablets by mouth every 6 (six) hours as needed. For migraine/headache    . CALCIUM CARBONATE 1250 MG PO TABS Oral Take 1 tablet by mouth daily.    . CHOLECALCIFEROL 50000 UNITS PO TABS Oral Take 1 tablet by mouth every 7 (seven) days. 4 tablet 3  . ESTRADIOL 0.05 MG/24HR TD PTTW  Place 1 patch on skin twice weekly. 8 patch  12  . FAMOTIDINE 10 MG PO TABS Oral Take 10 mg by mouth 2 (two) times daily.      . FENTANYL 100 MCG/HR TD PT72 Transdermal Place 1 patch onto the skin every 3 (three) days.      Marland Kitchen HYDROMORPHONE HCL 2 MG PO TABS Oral Take 2 mg by mouth every 4 (four) hours as needed.      . IBUPROFEN 200 MG PO TABS Oral Take 600 mg by mouth every 6 (six) hours as needed.    Marland Kitchen PANCRELIPASE (LIP-PROT-AMYL) 12000 UNITS PO CPEP Oral Take 2 capsules by mouth 3 (three) times daily with meals.    . ADULT MULTIVITAMIN W/MINERALS CH Oral Take 1 tablet by mouth daily.    Marland Kitchen ONDANSETRON HCL 4 MG PO TABS Oral Take 4 mg by mouth every 8 (eight) hours as needed.    Marland Kitchen PROGESTERONE MICRONIZED 100 MG PO CAPS Oral Take 1 capsule (100 mg total) by mouth at bedtime. 30 capsule 11  . TETRAHYDROZOLINE HCL 0.05 % OP SOLN Both Eyes Place 1 drop into both eyes daily. For allergy    . TRAZODONE HCL 50 MG PO TABS Oral  Take 50 mg by mouth at bedtime.        BP 155/88  Pulse 79  Temp(Src) 98.4 F (36.9 C) (Oral)  Resp 11  SpO2 100%  LMP 03/10/2009  Vital signs normal    Physical Exam  Nursing note and vitals reviewed. Constitutional: She is oriented to person, place, and time.  Non-toxic appearance. She does not appear ill. No distress.       Thin underweight, patient moves her head freely during the course of normal conversation. She denies pain in her neck.  HENT:  Head: Normocephalic and atraumatic.  Right Ear: External ear normal.  Left Ear: External ear normal.  Nose: Nose normal. No mucosal edema or rhinorrhea.  Mouth/Throat: Oropharynx is clear and moist and mucous membranes are normal. No dental abscesses or uvula swelling.  Eyes: Conjunctivae and EOM are normal. Pupils are equal, round, and reactive to light.  Neck: Normal range of motion and full passive range of motion without pain. Neck supple.       Patient has no nuchal rigidity  Cardiovascular: Normal rate, regular rhythm and normal heart sounds.  Exam reveals no gallop and no friction rub.   No murmur heard. Pulmonary/Chest: Effort normal and breath sounds normal. No respiratory distress. She has no wheezes. She has no rhonchi. She has no rales. She exhibits no tenderness and no crepitus.  Abdominal: Soft. Normal appearance and bowel sounds are normal. She exhibits no distension. There is no tenderness. There is no rebound and no guarding.  Musculoskeletal: Normal range of motion. She exhibits no edema and no tenderness.       Moves all extremities well.   Neurological: She is alert and oriented to person, place, and time. She has normal strength. No cranial nerve deficit.  Skin: Skin is warm, dry and intact. No rash noted. No erythema. No pallor.  Psychiatric: She has a normal mood and affect. Her speech is normal and behavior is normal. Her mood appears not anxious.    ED Course  Procedures (including critical care  time)   Medications  0.9 %  sodium chloride infusion (100 mL/hr Intravenous New Bag/Given 06/07/11 0024)  sodium chloride 0.9 % bolus 1,000 mL (1000 mL Intravenous Given 06/06/11 2323)  metoCLOPramide (REGLAN) injection 10 mg (10 mg Intravenous Given 06/06/11 2329)  diphenhydrAMINE (BENADRYL)  injection 25 mg (25 mg Intravenous Given 06/06/11 2325)  gi cocktail (Maalox,Lidocaine,Donnatal) (30 mL Oral Given 06/07/11 0022)   Patient's headache is gone with the Reglan and Benadryl. She is still some minor chest discomfort. She was given GI cocktail which has helped. She was able to drink fluids and felt improved enough to go home.   Results for orders placed during the hospital encounter of 06/06/11  CBC      Component Value Range   WBC 2.9 (*) 4.0 - 10.5 (K/uL)   RBC 3.41 (*) 3.87 - 5.11 (MIL/uL)   Hemoglobin 11.1 (*) 12.0 - 15.0 (g/dL)   HCT 40.9 (*) 81.1 - 46.0 (%)   MCV 97.1  78.0 - 100.0 (fL)   MCH 32.6  26.0 - 34.0 (pg)   MCHC 33.5  30.0 - 36.0 (g/dL)   RDW 91.4  78.2 - 95.6 (%)   Platelets 51 (*) 150 - 400 (K/uL)  DIFFERENTIAL      Component Value Range   Neutrophils Relative 59  43 - 77 (%)   Lymphocytes Relative 24  12 - 46 (%)   Monocytes Relative 12  3 - 12 (%)   Eosinophils Relative 4  0 - 5 (%)   Basophils Relative 1  0 - 1 (%)   Neutro Abs 1.8  1.7 - 7.7 (K/uL)   Lymphs Abs 0.7  0.7 - 4.0 (K/uL)   Monocytes Absolute 0.3  0.1 - 1.0 (K/uL)   Eosinophils Absolute 0.1  0.0 - 0.7 (K/uL)   Basophils Absolute 0.0  0.0 - 0.1 (K/uL)   Smear Review PLATELET COUNT CONFIRMED BY SMEAR    COMPREHENSIVE METABOLIC PANEL      Component Value Range   Sodium 133 (*) 135 - 145 (mEq/L)   Potassium 3.8  3.5 - 5.1 (mEq/L)   Chloride 95 (*) 96 - 112 (mEq/L)   CO2 29  19 - 32 (mEq/L)   Glucose, Bld 101 (*) 70 - 99 (mg/dL)   BUN 21  6 - 23 (mg/dL)   Creatinine, Ser 2.13  0.50 - 1.10 (mg/dL)   Calcium 9.3  8.4 - 08.6 (mg/dL)   Total Protein 7.1  6.0 - 8.3 (g/dL)   Albumin 3.9  3.5 - 5.2  (g/dL)   AST 578 (*) 0 - 37 (U/L)   ALT 42 (*) 0 - 35 (U/L)   Alkaline Phosphatase 164 (*) 39 - 117 (U/L)   Total Bilirubin 0.8  0.3 - 1.2 (mg/dL)   GFR calc non Af Amer >90  >90 (mL/min)   GFR calc Af Amer >90  >90 (mL/min)  URINALYSIS, ROUTINE W REFLEX MICROSCOPIC      Component Value Range   Color, Urine YELLOW  YELLOW    APPearance CLEAR  CLEAR    Specific Gravity, Urine 1.017  1.005 - 1.030    pH 7.5  5.0 - 8.0    Glucose, UA NEGATIVE  NEGATIVE (mg/dL)   Hgb urine dipstick NEGATIVE  NEGATIVE    Bilirubin Urine NEGATIVE  NEGATIVE    Ketones, ur TRACE (*) NEGATIVE (mg/dL)   Protein, ur NEGATIVE  NEGATIVE (mg/dL)   Urobilinogen, UA 4.0 (*) 0.0 - 1.0 (mg/dL)   Nitrite NEGATIVE  NEGATIVE    Leukocytes, UA NEGATIVE  NEGATIVE   POCT I-STAT TROPONIN I      Component Value Range   Troponin i, poc 0.01  0.00 - 0.08 (ng/mL)   Comment 3  LIPASE, BLOOD      Component Value Range   Lipase 6 (*) 11 - 59 (U/L)   Laboratory interpretation all normal except stable anemia, table elevation that is minor of her liver test.   Dg Chest 2 View  06/06/2011  *RADIOLOGY REPORT*  Clinical Data: 56 year old female with fever and cough.  CHEST - 2 VIEW  Comparison: None  Findings: The cardiomediastinal silhouette is unremarkable. The lungs are clear. There is no evidence of focal airspace disease, pulmonary edema, suspicious pulmonary nodule/mass, pleural effusion, or pneumothorax. No acute bony abnormalities are identified.  IMPRESSION: No evidence of active cardiopulmonary disease.  Original Report Authenticated By: Rosendo Gros, M.D.     Date: 06/06/2011  Rate: 82  Rhythm: normal sinus rhythm  QRS Axis: normal  Intervals: normal  ST/T Wave abnormalities: normal  Conduction Disutrbances:none  Narrative Interpretation:   Old EKG Reviewed: none available    1. Headache   2. Dehydration     Plan discharge Devoria Albe, MD, FACEP   MDM          Ward Givens, MD 06/07/11  [phone]

## 2011-06-07 LAB — URINALYSIS, ROUTINE W REFLEX MICROSCOPIC
Glucose, UA: NEGATIVE mg/dL
Leukocytes, UA: NEGATIVE
Protein, ur: NEGATIVE mg/dL
pH: 7.5 (ref 5.0–8.0)

## 2011-06-07 NOTE — Discharge Instructions (Signed)
Drink plenty of fluids. Use her Zofran for nausea or vomiting.  Monitor your fever. Recheck if you feel worse.

## 2011-06-07 NOTE — ED Notes (Signed)
Pt. Alert and oriented, discharged to home, pt. Ambulatory gait steady, NAD noted 

## 2011-06-22 ENCOUNTER — Other Ambulatory Visit: Payer: Self-pay | Admitting: *Deleted

## 2011-06-22 DIAGNOSIS — E559 Vitamin D deficiency, unspecified: Secondary | ICD-10-CM

## 2011-07-19 ENCOUNTER — Telehealth: Payer: Self-pay | Admitting: *Deleted

## 2011-07-19 NOTE — Telephone Encounter (Signed)
Pt called c/o UTI, informed pt that office visit would be best, pt last OV was Jan 2013. Pt will check schedule and call back

## 2012-08-25 ENCOUNTER — Other Ambulatory Visit: Payer: Self-pay

## 2012-08-25 ENCOUNTER — Other Ambulatory Visit: Payer: Self-pay | Admitting: Gynecology

## 2012-10-16 ENCOUNTER — Other Ambulatory Visit: Payer: Self-pay

## 2012-10-16 DIAGNOSIS — K861 Other chronic pancreatitis: Secondary | ICD-10-CM

## 2012-10-23 ENCOUNTER — Ambulatory Visit
Admission: RE | Admit: 2012-10-23 | Discharge: 2012-10-23 | Disposition: A | Discharge status: Home / Self Care | Payer: Medicare HMO | Source: Ambulatory Visit

## 2012-10-23 DIAGNOSIS — K861 Other chronic pancreatitis: Secondary | ICD-10-CM

## 2012-10-26 ENCOUNTER — Other Ambulatory Visit: Payer: Self-pay | Admitting: Gastroenterology

## 2012-11-02 ENCOUNTER — Other Ambulatory Visit: Payer: Medicare HMO

## 2012-11-03 ENCOUNTER — Ambulatory Visit
Admission: RE | Admit: 2012-11-03 | Discharge: 2012-11-03 | Disposition: A | Discharge status: Home / Self Care | Payer: Medicare HMO | Source: Ambulatory Visit | Attending: Gastroenterology | Admitting: Gastroenterology

## 2012-11-03 ENCOUNTER — Inpatient Hospital Stay: Admission: RE | Admit: 2012-11-03 | Payer: Medicare HMO | Source: Ambulatory Visit

## 2012-11-03 MED ORDER — IOHEXOL 300 MG/ML  SOLN
80.0000 mL | Freq: Once | INTRAMUSCULAR | Status: AC | PRN
  Administered 2012-11-03: 80 mL via INTRAVENOUS

## 2012-11-08 ENCOUNTER — Other Ambulatory Visit: Payer: Self-pay | Admitting: Gastroenterology

## 2012-11-08 DIAGNOSIS — R933 Abnormal findings on diagnostic imaging of other parts of digestive tract: Secondary | ICD-10-CM

## 2012-11-15 ENCOUNTER — Ambulatory Visit
Admission: RE | Admit: 2012-11-15 | Discharge: 2012-11-15 | Disposition: A | Discharge status: Home / Self Care | Payer: Medicare HMO | Source: Ambulatory Visit | Attending: Gastroenterology | Admitting: Gastroenterology

## 2012-11-15 DIAGNOSIS — R933 Abnormal findings on diagnostic imaging of other parts of digestive tract: Secondary | ICD-10-CM

## 2013-05-11 ENCOUNTER — Other Ambulatory Visit: Payer: Self-pay | Admitting: Family Medicine

## 2013-05-11 DIAGNOSIS — R7989 Other specified abnormal findings of blood chemistry: Secondary | ICD-10-CM

## 2013-05-11 DIAGNOSIS — R945 Abnormal results of liver function studies: Principal | ICD-10-CM

## 2013-05-21 ENCOUNTER — Ambulatory Visit
Admission: RE | Admit: 2013-05-21 | Discharge: 2013-05-21 | Disposition: A | Discharge status: Home / Self Care | Payer: Medicare HMO | Source: Ambulatory Visit | Attending: Family Medicine | Admitting: Family Medicine

## 2013-05-21 DIAGNOSIS — R7989 Other specified abnormal findings of blood chemistry: Secondary | ICD-10-CM

## 2013-05-21 DIAGNOSIS — R945 Abnormal results of liver function studies: Principal | ICD-10-CM

## 2014-09-24 DIAGNOSIS — Z79891 Long term (current) use of opiate analgesic: Secondary | ICD-10-CM | POA: Diagnosis not present

## 2014-09-24 DIAGNOSIS — G894 Chronic pain syndrome: Secondary | ICD-10-CM | POA: Diagnosis not present

## 2014-09-24 DIAGNOSIS — K861 Other chronic pancreatitis: Secondary | ICD-10-CM | POA: Diagnosis not present

## 2014-10-25 DIAGNOSIS — Z91038 Other insect allergy status: Secondary | ICD-10-CM | POA: Diagnosis not present

## 2014-10-25 DIAGNOSIS — W57XXXA Bitten or stung by nonvenomous insect and other nonvenomous arthropods, initial encounter: Secondary | ICD-10-CM | POA: Diagnosis not present

## 2014-10-25 DIAGNOSIS — R609 Edema, unspecified: Secondary | ICD-10-CM | POA: Diagnosis not present

## 2014-10-25 DIAGNOSIS — T7840XA Allergy, unspecified, initial encounter: Secondary | ICD-10-CM | POA: Diagnosis not present

## 2014-12-27 DIAGNOSIS — G894 Chronic pain syndrome: Secondary | ICD-10-CM | POA: Diagnosis not present

## 2014-12-27 DIAGNOSIS — K861 Other chronic pancreatitis: Secondary | ICD-10-CM | POA: Diagnosis not present

## 2015-02-19 DIAGNOSIS — Z0110 Encounter for examination of ears and hearing without abnormal findings: Secondary | ICD-10-CM | POA: Diagnosis not present

## 2017-05-08 DIAGNOSIS — R569 Unspecified convulsions: Secondary | ICD-10-CM | POA: Insufficient documentation

## 2017-05-08 DIAGNOSIS — G894 Chronic pain syndrome: Secondary | ICD-10-CM

## 2017-05-08 DIAGNOSIS — K219 Gastro-esophageal reflux disease without esophagitis: Secondary | ICD-10-CM

## 2017-05-08 DIAGNOSIS — K860 Alcohol-induced chronic pancreatitis: Secondary | ICD-10-CM | POA: Diagnosis present

## 2017-05-08 DIAGNOSIS — K903 Pancreatic steatorrhea: Secondary | ICD-10-CM | POA: Insufficient documentation

## 2017-05-08 HISTORY — DX: Gastro-esophageal reflux disease without esophagitis: K21.9

## 2017-06-22 ENCOUNTER — Other Ambulatory Visit: Payer: Self-pay

## 2017-06-22 ENCOUNTER — Encounter (HOSPITAL_BASED_OUTPATIENT_CLINIC_OR_DEPARTMENT_OTHER): Payer: Self-pay | Admitting: *Deleted

## 2017-06-22 DIAGNOSIS — M79601 Pain in right arm: Secondary | ICD-10-CM | POA: Diagnosis not present

## 2017-06-22 DIAGNOSIS — Z5321 Procedure and treatment not carried out due to patient leaving prior to being seen by health care provider: Secondary | ICD-10-CM | POA: Diagnosis not present
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
## 2017-06-23 ENCOUNTER — Emergency Department (HOSPITAL_BASED_OUTPATIENT_CLINIC_OR_DEPARTMENT_OTHER)
Admission: EM | Admit: 2017-06-23 | Discharge: 2017-06-23 | Disposition: A | Discharge status: Home / Self Care | Payer: Medicare Other | Attending: Emergency Medicine | Admitting: Emergency Medicine

## 2017-06-23 NOTE — ED Notes (Signed)
Please see downtime charting from [phone] 

## 2017-09-30 ENCOUNTER — Encounter (HOSPITAL_BASED_OUTPATIENT_CLINIC_OR_DEPARTMENT_OTHER): Payer: Self-pay | Admitting: *Deleted

## 2017-09-30 ENCOUNTER — Emergency Department (HOSPITAL_BASED_OUTPATIENT_CLINIC_OR_DEPARTMENT_OTHER)
Admission: EM | Admit: 2017-09-30 | Discharge: 2017-09-30 | Disposition: A | Discharge status: Home / Self Care | Payer: Medicare Other | Attending: Emergency Medicine | Admitting: Emergency Medicine

## 2017-09-30 ENCOUNTER — Other Ambulatory Visit: Payer: Self-pay

## 2017-09-30 DIAGNOSIS — Z91010 Allergy to peanuts: Secondary | ICD-10-CM | POA: Diagnosis not present

## 2017-09-30 DIAGNOSIS — Z7982 Long term (current) use of aspirin: Secondary | ICD-10-CM | POA: Diagnosis not present

## 2017-09-30 DIAGNOSIS — E876 Hypokalemia: Secondary | ICD-10-CM | POA: Diagnosis not present

## 2017-09-30 DIAGNOSIS — Z79899 Other long term (current) drug therapy: Secondary | ICD-10-CM | POA: Diagnosis not present

## 2017-09-30 DIAGNOSIS — R42 Dizziness and giddiness: Secondary | ICD-10-CM | POA: Diagnosis present

## 2017-09-30 HISTORY — DX: Liver disease, unspecified: K76.9

## 2017-09-30 LAB — COMPREHENSIVE METABOLIC PANEL
ALK PHOS: 121 U/L (ref 38–126)
ALT: 50 U/L — AB (ref 0–44)
AST: 104 U/L — ABNORMAL HIGH (ref 15–41)
Albumin: 4.6 g/dL (ref 3.5–5.0)
Anion gap: 13 (ref 5–15)
BILIRUBIN TOTAL: 1.4 mg/dL — AB (ref 0.3–1.2)
BUN: 25 mg/dL — ABNORMAL HIGH (ref 6–20)
CALCIUM: 9 mg/dL (ref 8.9–10.3)
CO2: 30 mmol/L (ref 22–32)
CREATININE: 0.84 mg/dL (ref 0.44–1.00)
Chloride: 94 mmol/L — ABNORMAL LOW (ref 98–111)
Glucose, Bld: 116 mg/dL — ABNORMAL HIGH (ref 70–99)
Potassium: 2.9 mmol/L — ABNORMAL LOW (ref 3.5–5.1)
Sodium: 137 mmol/L (ref 135–145)
Total Protein: 8 g/dL (ref 6.5–8.1)

## 2017-09-30 LAB — URINALYSIS, ROUTINE W REFLEX MICROSCOPIC
Bilirubin Urine: NEGATIVE
GLUCOSE, UA: NEGATIVE mg/dL
Hgb urine dipstick: NEGATIVE
KETONES UR: NEGATIVE mg/dL
Nitrite: NEGATIVE
PROTEIN: NEGATIVE mg/dL
pH: 6.5 (ref 5.0–8.0)

## 2017-09-30 LAB — CBC WITH DIFFERENTIAL/PLATELET
BASOS ABS: 0 10*3/uL (ref 0.0–0.1)
Basophils Relative: 0 %
EOS ABS: 0.1 10*3/uL (ref 0.0–0.7)
Eosinophils Relative: 2 %
HCT: 36.4 % (ref 36.0–46.0)
HEMOGLOBIN: 12.1 g/dL (ref 12.0–15.0)
LYMPHS ABS: 0.9 10*3/uL (ref 0.7–4.0)
Lymphocytes Relative: 21 %
MCH: 31.8 pg (ref 26.0–34.0)
MCHC: 33.2 g/dL (ref 30.0–36.0)
MCV: 95.8 fL (ref 78.0–100.0)
Monocytes Absolute: 0.4 10*3/uL (ref 0.1–1.0)
Monocytes Relative: 9 %
Neutro Abs: 3.1 10*3/uL (ref 1.7–7.7)
Neutrophils Relative %: 68 %
Platelets: 64 10*3/uL — ABNORMAL LOW (ref 150–400)
RBC: 3.8 MIL/uL — AB (ref 3.87–5.11)
RDW: 11.5 % (ref 11.5–15.5)
WBC: 4.6 10*3/uL (ref 4.0–10.5)

## 2017-09-30 LAB — URINALYSIS, MICROSCOPIC (REFLEX): RBC / HPF: NONE SEEN RBC/hpf (ref 0–5)

## 2017-09-30 LAB — AMMONIA: Ammonia: 14 umol/L (ref 9–35)

## 2017-09-30 MED ORDER — POTASSIUM CHLORIDE CRYS ER 20 MEQ PO TBCR
60.0000 meq | EXTENDED_RELEASE_TABLET | Freq: Once | ORAL | Status: AC
  Administered 2017-09-30: 60 meq via ORAL
  Filled 2017-09-30: qty 3

## 2017-09-30 NOTE — ED Notes (Signed)
Pt reports ETOH use x 3 months- last drink a week ago. No withdrawal symptoms noticed, but patient admits to having tremors to arms a week ago. Pt reports PCP apt Aug 1st so sent to come here for eval of ammonia levels. Pt walked to treatment room with steady gate. Pt able to follow commands. Neuro intact.

## 2017-09-30 NOTE — ED Triage Notes (Signed)
Pt c/o dizziness and confusion, states her liver enzymes are high and she needs lactalose

## 2017-09-30 NOTE — ED Provider Notes (Signed)
[facility] EMERGENCY DEPARTMENT Provider Note   CSN: [id number] Arrival date & time: 09/30/17  1233     History   Chief Complaint Chief Complaint  Patient presents with  . Dizziness    HPI Carolyn [name] is a 61 y.o. female.  Patient is a 62 year old female with a history of chronic pancreatitis, liver disease, alcohol abuse, malabsorption syndrome, multiple abdominal surgeries who is presenting today with a concern that there may be something wrong with her liver.  Patient is concerned her ammonia may be elevated.  Patient states that for the last 3 months she is drank approximately 1 bottle of alcohol per month which she knows she is not supposed to.  She has not been on lactulose for some time and stopped drinking about 1.5 weeks ago.  She states several days ago she was feeling having some symptoms of shakiness in her arms, feeling a bit foggy and having a little bit difficulty walking.  She is not having those symptoms today and states that the tremor in her hands has improved.  She denies any trauma, fever, cough, shortness of breath.  She is not having new abdominal pain or vomiting.  The history is provided by the patient.  Dizziness    Past Medical History:  Diagnosis Date  . Chronic pancreatitis (HCC)   . Endometriosis   . Liver disease   . Malabsorption    MALABSORPTION SYNDROME  . Osteoporosis 03/2011   t score -2.5  . Pancreatitis    due to cyst and tumors due to calcifications  . Seizure (HCC)   . Vitamin D deficiency 2012   VIT D 15    Patient Active Problem List   Diagnosis Date Noted  . Osteoporosis 03/16/2011  . Endometriosis   . Malabsorption   . Pancreatitis   . Seizure (HCC)   . Vitamin D deficiency     Past Surgical History:  Procedure Laterality Date  . APPENDECTOMY    . FEEDING TUBE RELOCATION  2010  . JEJUNOSTOMY FEEDING TUBE  1990  . MULTIPLE GASTRIC SURGERIES    . MYOMECTOMY    . TUBAL LIGATION       OB History    Gravida  0   Para      Term      Preterm      AB      Living        SAB      TAB      Ectopic      Multiple      Live Births               Home Medications    Prior to Admission medications   Medication Sig Start Date End Date Taking? Authorizing Provider  aspirin EC 81 MG tablet Take 162 mg by mouth once.    [provider]  aspirin-acetaminophen-caffeine (EXCEDRIN MIGRAINE) [phone] MG per tablet Take 2 tablets by mouth every 6 (six) hours as needed. For migraine/headache    [provider]  calcium carbonate (OS-CAL - DOSED IN MG OF ELEMENTAL CALCIUM) 1250 MG tablet Take 1 tablet by mouth daily.    [provider]  Cholecalciferol 50000 UNITS TABS Take 1 tablet by mouth every 7 (seven) days. 03/12/11   Fontaine, Nadyne Coombes, MD  estradiol (VIVELLE-DOT) 0.05 MG/24HR Place 1 patch on skin twice weekly. 04/07/11   Fontaine, Nadyne Coombes, MD  famotidine (PEPCID) 10 MG tablet Take 10 mg by mouth 2 (  two) times daily.      [provider]  fentaNYL (DURAGESIC - DOSED MCG/HR) 100 MCG/HR Place 1 patch onto the skin every 3 (three) days.      [provider]  HYDROmorphone (DILAUDID) 2 MG tablet Take 2 mg by mouth every 4 (four) hours as needed.      [provider]  ibuprofen (ADVIL,MOTRIN) 200 MG tablet Take 600 mg by mouth every 6 (six) hours as needed.    [provider]  lipase/protease/amylase (CREON) 12000 UNITS CPEP Take 2 capsules by mouth 3 (three) times daily with meals.    [provider]  Multiple Vitamin (MULITIVITAMIN WITH MINERALS) TABS Take 1 tablet by mouth daily.    [provider]  ondansetron (ZOFRAN) 4 MG tablet Take 4 mg by mouth every 8 (eight) hours as needed.    [provider]  progesterone (PROMETRIUM) 100 MG capsule Take 1 capsule (100 mg total) by mouth at bedtime. 03/11/11 03/10/12  Fontaine, Nadyne Coombesimothy P, MD  tetrahydrozoline 0.05 % ophthalmic solution Place 1 drop  into both eyes daily. For allergy    [provider]  traZODone (DESYREL) 50 MG tablet Take 50 mg by mouth at bedtime.      [provider]  PROMETHAZINE HCL PO Take 4 mg by mouth.   06/06/11  [provider]  Ranitidine HCl (ZANTAC PO) Take by mouth.    06/06/11  [provider]    Family History Family History  Problem Relation Age of Onset  . Cancer Father        BONE  . Breast cancer Maternal Grandmother     Social History Social History   Tobacco Use  . Smoking status: Never Smoker  . Smokeless tobacco: Never Used  Substance Use Topics  . Alcohol use: No  . Drug use: No     Allergies   Peanut-containing drug products   Review of Systems Review of Systems  Neurological: Positive for dizziness.  All other systems reviewed and are negative.    Physical Exam Updated Vital Signs BP (!) 148/86   Pulse 88   Temp 98.7 F (37.1 C) (Oral)   Resp 18   Ht 5\' 8"  (1.727 m)   Wt 44.8 kg (98 lb 11.2 oz)   LMP 03/10/2009   SpO2 100%   BMI 15.01 kg/m   Physical Exam  Constitutional: She is oriented to person, place, and time. She appears well-developed and well-nourished. She appears cachectic. No distress.  HENT:  Head: Normocephalic and atraumatic.  Mouth/Throat: Oropharynx is clear and moist.  Eyes: Pupils are equal, round, and reactive to light. Conjunctivae and EOM are normal.  Neck: Normal range of motion. Neck supple.  Cardiovascular: Normal rate, regular rhythm and intact distal pulses.  No murmur heard. Pulmonary/Chest: Effort normal and breath sounds normal. No respiratory distress. She has no wheezes. She has no rales.  Abdominal: Soft. She exhibits no distension. There is no tenderness. There is no rebound and no guarding.  Multiple well healed scars over the abd  Musculoskeletal: Normal range of motion. She exhibits no edema or tenderness.  No asterixis  Neurological: She is alert and oriented to person, place, and  time. She has normal strength. No sensory deficit. Coordination and gait normal.  Skin: Skin is warm and dry. No rash noted. No erythema.  Psychiatric: She has a normal mood and affect. Her behavior is normal.  Nursing note and vitals reviewed.    ED Treatments / Results  Labs (all labs ordered are listed, but only abnormal results are displayed) Labs Reviewed  CBC WITH DIFFERENTIAL/PLATELET - Abnormal; Notable for the following components:      Result Value   RBC 3.80 (*)    Platelets 64 (*)    All other components within normal limits  COMPREHENSIVE METABOLIC PANEL - Abnormal; Notable for the following components:   Potassium 2.9 (*)    Chloride 94 (*)    Glucose, Bld 116 (*)    BUN 25 (*)    AST 104 (*)    ALT 50 (*)    Total Bilirubin 1.4 (*)    All other components within normal limits  URINALYSIS, ROUTINE W REFLEX MICROSCOPIC - Abnormal; Notable for the following components:   Specific Gravity, Urine <1.005 (*)    Leukocytes, UA TRACE (*)    All other components within normal limits  URINALYSIS, MICROSCOPIC (REFLEX) - Abnormal; Notable for the following components:   Bacteria, UA RARE (*)    All other components within normal limits  AMMONIA    EKG None  Radiology No results found.  Procedures Procedures (including critical care time)  Medications Ordered in ED Medications  potassium chloride SA (K-DUR,KLOR-CON) CR tablet 60 mEq (has no administration in time range)     Initial Impression / Assessment and Plan / ED Course  I have reviewed the triage vital signs and the nursing notes.  Pertinent labs & imaging results that were available during my care of the patient were reviewed by me and considered in my medical decision making (see chart for details).     Patient presenting today complaining of some vague symptoms of feeling a little bit confused kind of cloudy and having some tremor in her hands over the last few days which is not currently present.   Patient has had no nausea, vomiting or abdominal pain.  She is well-appearing on exam with normal vital signs.  Patient does not have asterixis or evidence of elevated pneumonia today.  Patient is not displaying any signs of withdrawal at this time.  Labs are consistent with hypokalemia which is most likely related to her alcohol abuse and her malabsorption syndrome.  No other significant changes in her labs since 2013.  Patient does take a multivitamin daily.  Ammonia today is within normal limits.  Patient is able to ambulate here without difficulty.  Low suspicion for head injury or bleed.  Findings discussed with the patient she will follow-up with her PCP.  Final Clinical Impressions(s) / ED Diagnoses   Final diagnoses:  Hypokalemia    ED Discharge Orders    None       Gwyneth Sprout, MD 09/30/17 [phone]

## 2018-06-22 ENCOUNTER — Other Ambulatory Visit: Payer: Self-pay

## 2018-06-22 ENCOUNTER — Emergency Department (HOSPITAL_BASED_OUTPATIENT_CLINIC_OR_DEPARTMENT_OTHER)
Admission: EM | Admit: 2018-06-22 | Discharge: 2018-06-22 | Disposition: A | Discharge status: Home / Self Care | Payer: Medicare Other | Attending: Emergency Medicine | Admitting: Emergency Medicine

## 2018-06-22 ENCOUNTER — Encounter (HOSPITAL_BASED_OUTPATIENT_CLINIC_OR_DEPARTMENT_OTHER): Payer: Self-pay | Admitting: *Deleted

## 2018-06-22 ENCOUNTER — Emergency Department (HOSPITAL_BASED_OUTPATIENT_CLINIC_OR_DEPARTMENT_OTHER): Payer: Medicare Other

## 2018-06-22 DIAGNOSIS — W109XXA Fall (on) (from) unspecified stairs and steps, initial encounter: Secondary | ICD-10-CM | POA: Insufficient documentation

## 2018-06-22 DIAGNOSIS — Y9389 Activity, other specified: Secondary | ICD-10-CM | POA: Diagnosis not present

## 2018-06-22 DIAGNOSIS — Z91010 Allergy to peanuts: Secondary | ICD-10-CM | POA: Insufficient documentation

## 2018-06-22 DIAGNOSIS — Y999 Unspecified external cause status: Secondary | ICD-10-CM | POA: Insufficient documentation

## 2018-06-22 DIAGNOSIS — Y929 Unspecified place or not applicable: Secondary | ICD-10-CM | POA: Insufficient documentation

## 2018-06-22 DIAGNOSIS — Y939 Activity, unspecified: Secondary | ICD-10-CM | POA: Diagnosis not present

## 2018-06-22 DIAGNOSIS — Z7982 Long term (current) use of aspirin: Secondary | ICD-10-CM | POA: Insufficient documentation

## 2018-06-22 DIAGNOSIS — Z79899 Other long term (current) drug therapy: Secondary | ICD-10-CM | POA: Insufficient documentation

## 2018-06-22 DIAGNOSIS — S59912A Unspecified injury of left forearm, initial encounter: Secondary | ICD-10-CM | POA: Diagnosis present

## 2018-06-22 DIAGNOSIS — S52502A Unspecified fracture of the lower end of left radius, initial encounter for closed fracture: Secondary | ICD-10-CM | POA: Diagnosis not present

## 2018-06-22 NOTE — Discharge Instructions (Addendum)
Make an appointment to follow-up with hand surgery.  You can use the hand surgeon used in the past otherwise we given you who is on-call for today.  Use the sling for your own comfort.  Keep the arm elevated is much as possible.  Keep the splint dry.  Call orthopedics for follow-up by phone tomorrow.  They have 2 weeks to see to do formal treatment.

## 2018-06-22 NOTE — ED Triage Notes (Signed)
Pt c/o fall up the stairs left wrist deformity noted

## 2018-06-22 NOTE — ED Provider Notes (Signed)
[facility] EMERGENCY DEPARTMENT Provider Note   CSN: [id number] Arrival date & time: 06/22/18  1848    History   Chief Complaint Chief Complaint  Patient presents with  . Fall    HPI Carolyn [name] is a 63 y.o. female.     Patient with a fall shortly prior to arrival.  She was fell going up steps reached with outstretched left arm injury to her left wrist with deformity.  No other injuries.  Patient has a history of chronic pain and is tightly controlled by pain management.  Patient is stating that she does not want any pain medicine.     Past Medical History:  Diagnosis Date  . Chronic pancreatitis (HCC)   . Endometriosis   . Liver disease   . Malabsorption    MALABSORPTION SYNDROME  . Osteoporosis 03/2011   t score -2.5  . Pancreatitis    due to cyst and tumors due to calcifications  . Seizure (HCC)   . Vitamin D deficiency 2012   VIT D 15    Patient Active Problem List   Diagnosis Date Noted  . Osteoporosis 03/16/2011  . Endometriosis   . Malabsorption   . Pancreatitis   . Seizure (HCC)   . Vitamin D deficiency     Past Surgical History:  Procedure Laterality Date  . APPENDECTOMY    . FEEDING TUBE RELOCATION  2010  . JEJUNOSTOMY FEEDING TUBE  1990  . MULTIPLE GASTRIC SURGERIES    . MYOMECTOMY    . TUBAL LIGATION       OB History    Gravida  0   Para      Term      Preterm      AB      Living        SAB      TAB      Ectopic      Multiple      Live Births               Home Medications    Prior to Admission medications   Medication Sig Start Date End Date Taking? Authorizing Provider  aspirin EC 81 MG tablet Take 162 mg by mouth once.    [provider]  aspirin-acetaminophen-caffeine (EXCEDRIN MIGRAINE) [phone] MG per tablet Take 2 tablets by mouth every 6 (six) hours as needed. For migraine/headache    [provider]  calcium carbonate (OS-CAL - DOSED IN MG OF ELEMENTAL CALCIUM) 1250  MG tablet Take 1 tablet by mouth daily.    [provider]  Cholecalciferol 50000 UNITS TABS Take 1 tablet by mouth every 7 (seven) days. 03/12/11   Fontaine, Nadyne Coombes, MD  estradiol (VIVELLE-DOT) 0.05 MG/24HR Place 1 patch on skin twice weekly. 04/07/11   Fontaine, Nadyne Coombes, MD  famotidine (PEPCID) 10 MG tablet Take 10 mg by mouth 2 (two) times daily.      [provider]  fentaNYL (DURAGESIC - DOSED MCG/HR) 100 MCG/HR Place 1 patch onto the skin every 3 (three) days.      [provider]  HYDROmorphone (DILAUDID) 2 MG tablet Take 2 mg by mouth every 4 (four) hours as needed.      [provider]  ibuprofen (ADVIL,MOTRIN) 200 MG tablet Take 600 mg by mouth every 6 (six) hours as needed.    [provider]  lipase/protease/amylase (CREON) 12000 UNITS CPEP Take 2 capsules by mouth 3 (three) times daily with meals.  [provider]  Multiple Vitamin (MULITIVITAMIN WITH MINERALS) TABS Take 1 tablet by mouth daily.    [provider]  ondansetron (ZOFRAN) 4 MG tablet Take 4 mg by mouth every 8 (eight) hours as needed.    [provider]  progesterone (PROMETRIUM) 100 MG capsule Take 1 capsule (100 mg total) by mouth at bedtime. 03/11/11 03/10/12  Fontaine, Nadyne Coombesimothy P, MD  tetrahydrozoline 0.05 % ophthalmic solution Place 1 drop into both eyes daily. For allergy    [provider]  traZODone (DESYREL) 50 MG tablet Take 50 mg by mouth at bedtime.      [provider]  PROMETHAZINE HCL PO Take 4 mg by mouth.   06/06/11  [provider]  Ranitidine HCl (ZANTAC PO) Take by mouth.    06/06/11  [provider]    Family History Family History  Problem Relation Age of Onset  . Cancer Father        BONE  . Breast cancer Maternal Grandmother     Social History Social History   Tobacco Use  . Smoking status: Never Smoker  . Smokeless tobacco: Never Used  Substance Use Topics  . Alcohol use: No   . Drug use: No     Allergies   Peanut-containing drug products   Review of Systems Review of Systems  Constitutional: Negative for chills and fever.  HENT: Negative for rhinorrhea and sore throat.   Eyes: Negative for visual disturbance.  Respiratory: Negative for cough and shortness of breath.   Cardiovascular: Negative for chest pain and leg swelling.  Gastrointestinal: Negative for abdominal pain, diarrhea, nausea and vomiting.  Genitourinary: Negative for dysuria.  Musculoskeletal: Positive for joint swelling. Negative for back pain and neck pain.  Skin: Negative for rash.  Neurological: Negative for dizziness, light-headedness and headaches.  Hematological: Does not bruise/bleed easily.  Psychiatric/Behavioral: Negative for confusion.     Physical Exam Updated Vital Signs BP (!) 181/98   Pulse 90   Temp 98.3 F (36.8 C) (Oral)   Resp 18   Ht 1.727 m (5\' 8" )   Wt 46.3 kg   LMP 03/10/2009   SpO2 94%   BMI 15.51 kg/m   Physical Exam Vitals signs and nursing note reviewed.  Constitutional:      General: She is not in acute distress.    Appearance: She is well-developed.  HENT:     Head: Normocephalic and atraumatic.  Eyes:     Conjunctiva/sclera: Conjunctivae normal.  Neck:     Musculoskeletal: Neck supple.  Cardiovascular:     Rate and Rhythm: Normal rate and regular rhythm.     Heart sounds: No murmur.  Pulmonary:     Effort: Pulmonary effort is normal. No respiratory distress.     Breath sounds: Normal breath sounds.  Abdominal:     Palpations: Abdomen is soft.     Tenderness: There is no abdominal tenderness.  Musculoskeletal:     Comments: Left wrist with swelling.  And deformity.  Good cap refill to the fingers.  Good movement of the fingers.  No injury to the elbow full range of movement of the left elbow and left shoulder area.  Radial pulses 2+.  No other extremity injuries. no wounds.  Skin:    General: Skin is warm and dry.     Capillary  Refill: Capillary refill takes less than 2 seconds.  Neurological:     General: No focal deficit present.     Mental Status: She is  alert and oriented to person, place, and time.      ED Treatments / Results  Labs (all labs ordered are listed, but only abnormal results are displayed) Labs Reviewed - No data to display  EKG None  Radiology Dg Wrist Complete Left  Result Date: 06/22/2018 CLINICAL DATA:  Initial evaluation for acute trauma, fall. EXAM: LEFT WRIST - COMPLETE 3+ VIEW COMPARISON:  None. FINDINGS: Acute comminuted displaced and impacted fracture of the distal left radius with slight ulnar and dorsal angulation. Adjacent ulna intact without fracture. Ulnar positive variance noted. Soft tissue swelling noted about the wrist. Visualized hand demonstrates no acute finding. IMPRESSION: Acute comminuted displaced and impacted fracture of the distal left radius. Electronically Signed   By: Rise Mu M.D.   On: 06/22/2018 19:36    Procedures Procedures (including critical care time)  Medications Ordered in ED Medications - No data to display   Initial Impression / Assessment and Plan / ED Course  I have reviewed the triage vital signs and the nursing notes.  Pertinent labs & imaging results that were available during my care of the patient were reviewed by me and considered in my medical decision making (see chart for details).        X-rays show evidence of a comminuted distal radial fracture.  Some angulation.  We will go and place an sugar tong splint.  Patient states she already has a Hydrographic surveyor that is following her for cyst on her hand.  Also did give her the on-call hand surgeon in case she has any trouble making arrangements because she could not come up with the name of the hand surgeon.  Sounds as if it is a group in [place].  Patient given a sling patient is requesting not to receive any pain medicine.  She will elevate the arm.  And give hand surgery  a call tomorrow to arrange follow-up.    Final Clinical Impressions(s) / ED Diagnoses   Final diagnoses:  Closed fracture of distal end of left radius, unspecified fracture morphology, initial encounter    ED Discharge Orders    None       Vanetta Mulders, MD 06/22/18 2023

## 2018-06-28 ENCOUNTER — Other Ambulatory Visit: Payer: Self-pay | Admitting: Orthopedic Surgery

## 2018-06-28 ENCOUNTER — Other Ambulatory Visit: Payer: Self-pay

## 2018-06-28 ENCOUNTER — Encounter (HOSPITAL_BASED_OUTPATIENT_CLINIC_OR_DEPARTMENT_OTHER): Payer: Self-pay | Admitting: *Deleted

## 2018-06-29 ENCOUNTER — Ambulatory Visit (HOSPITAL_BASED_OUTPATIENT_CLINIC_OR_DEPARTMENT_OTHER): Payer: Medicare Other | Admitting: Anesthesiology

## 2018-06-29 ENCOUNTER — Ambulatory Visit (HOSPITAL_BASED_OUTPATIENT_CLINIC_OR_DEPARTMENT_OTHER)
Admission: RE | Admit: 2018-06-29 | Discharge: 2018-06-29 | Disposition: A | Discharge status: Home / Self Care | Payer: Medicare Other | Attending: Orthopedic Surgery | Admitting: Orthopedic Surgery

## 2018-06-29 ENCOUNTER — Other Ambulatory Visit: Payer: Self-pay

## 2018-06-29 ENCOUNTER — Encounter (HOSPITAL_BASED_OUTPATIENT_CLINIC_OR_DEPARTMENT_OTHER)
Admission: RE | Disposition: A | Discharge status: Home / Self Care | Payer: Self-pay | Source: Home / Self Care | Attending: Orthopedic Surgery

## 2018-06-29 ENCOUNTER — Encounter (HOSPITAL_BASED_OUTPATIENT_CLINIC_OR_DEPARTMENT_OTHER): Payer: Self-pay

## 2018-06-29 DIAGNOSIS — Z803 Family history of malignant neoplasm of breast: Secondary | ICD-10-CM | POA: Insufficient documentation

## 2018-06-29 DIAGNOSIS — K909 Intestinal malabsorption, unspecified: Secondary | ICD-10-CM | POA: Insufficient documentation

## 2018-06-29 DIAGNOSIS — Z9884 Bariatric surgery status: Secondary | ICD-10-CM | POA: Insufficient documentation

## 2018-06-29 DIAGNOSIS — W1839XA Other fall on same level, initial encounter: Secondary | ICD-10-CM | POA: Diagnosis not present

## 2018-06-29 DIAGNOSIS — Z791 Long term (current) use of non-steroidal anti-inflammatories (NSAID): Secondary | ICD-10-CM | POA: Diagnosis not present

## 2018-06-29 DIAGNOSIS — Z91010 Allergy to peanuts: Secondary | ICD-10-CM | POA: Insufficient documentation

## 2018-06-29 DIAGNOSIS — K219 Gastro-esophageal reflux disease without esophagitis: Secondary | ICD-10-CM | POA: Insufficient documentation

## 2018-06-29 DIAGNOSIS — Z808 Family history of malignant neoplasm of other organs or systems: Secondary | ICD-10-CM | POA: Diagnosis not present

## 2018-06-29 DIAGNOSIS — Z79899 Other long term (current) drug therapy: Secondary | ICD-10-CM | POA: Insufficient documentation

## 2018-06-29 DIAGNOSIS — E559 Vitamin D deficiency, unspecified: Secondary | ICD-10-CM | POA: Diagnosis not present

## 2018-06-29 DIAGNOSIS — M810 Age-related osteoporosis without current pathological fracture: Secondary | ICD-10-CM | POA: Diagnosis not present

## 2018-06-29 DIAGNOSIS — K861 Other chronic pancreatitis: Secondary | ICD-10-CM | POA: Insufficient documentation

## 2018-06-29 DIAGNOSIS — S52572A Other intraarticular fracture of lower end of left radius, initial encounter for closed fracture: Secondary | ICD-10-CM | POA: Insufficient documentation

## 2018-06-29 DIAGNOSIS — Z9049 Acquired absence of other specified parts of digestive tract: Secondary | ICD-10-CM | POA: Insufficient documentation

## 2018-06-29 DIAGNOSIS — S52572K Other intraarticular fracture of lower end of left radius, subsequent encounter for closed fracture with nonunion: Secondary | ICD-10-CM | POA: Diagnosis present

## 2018-06-29 HISTORY — DX: Gastro-esophageal reflux disease without esophagitis: K21.9

## 2018-06-29 HISTORY — PX: OPEN REDUCTION INTERNAL FIXATION (ORIF) DISTAL RADIAL FRACTURE: SHX5989

## 2018-06-29 SURGERY — OPEN REDUCTION INTERNAL FIXATION (ORIF) DISTAL RADIUS FRACTURE
Anesthesia: Monitor Anesthesia Care | Site: Wrist | Laterality: Left

## 2018-06-29 MED ORDER — CEFAZOLIN SODIUM-DEXTROSE 2-4 GM/100ML-% IV SOLN
INTRAVENOUS | Status: AC
  Filled 2018-06-29: qty 100

## 2018-06-29 MED ORDER — PROPOFOL 500 MG/50ML IV EMUL
INTRAVENOUS | Status: DC | PRN
  Administered 2018-06-29: 200 ug/kg/min via INTRAVENOUS

## 2018-06-29 MED ORDER — METHYLENE BLUE 0.5 % INJ SOLN
INTRAVENOUS | Status: AC
  Filled 2018-06-29: qty 10

## 2018-06-29 MED ORDER — 0.9 % SODIUM CHLORIDE (POUR BTL) OPTIME
TOPICAL | Status: DC | PRN
  Administered 2018-06-29: 1000 mL

## 2018-06-29 MED ORDER — MIDAZOLAM HCL 2 MG/2ML IJ SOLN
1.0000 mg | INTRAMUSCULAR | Status: DC | PRN
  Administered 2018-06-29: 1 mg via INTRAVENOUS

## 2018-06-29 MED ORDER — PROPOFOL 10 MG/ML IV BOLUS
INTRAVENOUS | Status: DC | PRN
  Administered 2018-06-29: 30 mg via INTRAVENOUS

## 2018-06-29 MED ORDER — MIDAZOLAM HCL 2 MG/2ML IJ SOLN
INTRAMUSCULAR | Status: AC
  Filled 2018-06-29: qty 2

## 2018-06-29 MED ORDER — HEPARIN SOD (PORK) LOCK FLUSH 100 UNIT/ML IV SOLN
INTRAVENOUS | Status: AC
  Filled 2018-06-29: qty 5

## 2018-06-29 MED ORDER — KETOROLAC TROMETHAMINE 30 MG/ML IJ SOLN: 30.0000 mg | Freq: Once | INTRAMUSCULAR | Status: DC | PRN

## 2018-06-29 MED ORDER — LIDOCAINE-EPINEPHRINE (PF) 1.5 %-1:200000 IJ SOLN
INTRAMUSCULAR | Status: DC | PRN
  Administered 2018-06-29: 10 mL via PERINEURAL

## 2018-06-29 MED ORDER — CEFAZOLIN SODIUM-DEXTROSE 2-4 GM/100ML-% IV SOLN
2.0000 g | INTRAVENOUS | Status: AC
  Administered 2018-06-29: 2 g via INTRAVENOUS

## 2018-06-29 MED ORDER — FENTANYL CITRATE (PF) 100 MCG/2ML IJ SOLN
50.0000 ug | INTRAMUSCULAR | Status: DC | PRN
  Administered 2018-06-29: 50 ug via INTRAVENOUS

## 2018-06-29 MED ORDER — OXYCODONE-ACETAMINOPHEN 5-325 MG PO TABS: ORAL_TABLET | ORAL | 0 refills | Status: DC

## 2018-06-29 MED ORDER — FENTANYL CITRATE (PF) 100 MCG/2ML IJ SOLN: 25.0000 ug | INTRAMUSCULAR | Status: DC | PRN

## 2018-06-29 MED ORDER — PROPOFOL 10 MG/ML IV BOLUS
INTRAVENOUS | Status: AC
  Filled 2018-06-29: qty 20

## 2018-06-29 MED ORDER — PROMETHAZINE HCL 25 MG/ML IJ SOLN: 6.2500 mg | INTRAMUSCULAR | Status: DC | PRN

## 2018-06-29 MED ORDER — LACTATED RINGERS IV SOLN
INTRAVENOUS | Status: DC
  Administered 2018-06-29: 10:00:00 via INTRAVENOUS

## 2018-06-29 MED ORDER — CHLORHEXIDINE GLUCONATE 4 % EX LIQD: 60.0000 mL | Freq: Once | CUTANEOUS | Status: DC

## 2018-06-29 MED ORDER — FENTANYL CITRATE (PF) 100 MCG/2ML IJ SOLN
INTRAMUSCULAR | Status: AC
  Filled 2018-06-29: qty 2

## 2018-06-29 MED ORDER — BUPIVACAINE HCL (PF) 0.5 % IJ SOLN
INTRAMUSCULAR | Status: DC | PRN
  Administered 2018-06-29: 20 mL via PERINEURAL

## 2018-06-29 MED ORDER — LIDOCAINE 2% (20 MG/ML) 5 ML SYRINGE
INTRAMUSCULAR | Status: AC
  Filled 2018-06-29: qty 5

## 2018-06-29 MED ORDER — SCOPOLAMINE 1 MG/3DAYS TD PT72: 1.0000 | MEDICATED_PATCH | Freq: Once | TRANSDERMAL | Status: DC | PRN

## 2018-06-29 MED ORDER — LIDOCAINE 2% (20 MG/ML) 5 ML SYRINGE
INTRAMUSCULAR | Status: DC | PRN
  Administered 2018-06-29: 50 mg via INTRAVENOUS

## 2018-06-29 MED ORDER — PROPOFOL 500 MG/50ML IV EMUL
INTRAVENOUS | Status: AC
  Filled 2018-06-29: qty 50

## 2018-06-29 SURGICAL SUPPLY — 76 items
APL PRP STRL LF DISP 70% ISPRP (MISCELLANEOUS) ×1
BANDAGE ACE 3X5.8 VEL STRL LF (GAUZE/BANDAGES/DRESSINGS) ×3 IMPLANT
BANDAGE ACE 4X5 VEL STRL LF (GAUZE/BANDAGES/DRESSINGS) ×2 IMPLANT
BIT DRILL 2.0 LNG QUCK RELEASE (BIT) IMPLANT
BIT DRILL 2.8X5 QR DISP (BIT) ×2 IMPLANT
BLADE SURG 15 STRL LF DISP TIS (BLADE) ×2 IMPLANT
BLADE SURG 15 STRL SS (BLADE) ×6
BNDG CMPR 9X4 STRL LF SNTH (GAUZE/BANDAGES/DRESSINGS) ×1
BNDG ESMARK 4X9 LF (GAUZE/BANDAGES/DRESSINGS) ×3 IMPLANT
BNDG GAUZE ELAST 4 BULKY (GAUZE/BANDAGES/DRESSINGS) ×1 IMPLANT
BNDG PLASTER X FAST 3X3 WHT LF (CAST SUPPLIES) ×10 IMPLANT
BNDG PLSTR 9X3 FST ST WHT (CAST SUPPLIES)
CHLORAPREP W/TINT 26 (MISCELLANEOUS) ×3 IMPLANT
CORD BIPOLAR FORCEPS 12FT (ELECTRODE) ×3 IMPLANT
COVER BACK TABLE REUSABLE LG (DRAPES) ×3 IMPLANT
COVER MAYO STAND REUSABLE (DRAPES) ×3 IMPLANT
COVER WAND RF STERILE (DRAPES) IMPLANT
CUFF TOURN SGL QUICK 18X4 (TOURNIQUET CUFF) ×2 IMPLANT
CUFF TOURN SGL QUICK 24 (TOURNIQUET CUFF)
CUFF TRNQT CYL 24X4X16.5-23 (TOURNIQUET CUFF) IMPLANT
DRAPE EXTREMITY T 121X128X90 (DISPOSABLE) ×3 IMPLANT
DRAPE OEC MINIVIEW 54X84 (DRAPES) ×3 IMPLANT
DRAPE SURG 17X23 STRL (DRAPES) ×3 IMPLANT
DRILL 2.0 LNG QUICK RELEASE (BIT) ×3
DRSG PAD ABDOMINAL 8X10 ST (GAUZE/BANDAGES/DRESSINGS) ×2 IMPLANT
GAUZE SPONGE 4X4 12PLY STRL (GAUZE/BANDAGES/DRESSINGS) ×3 IMPLANT
GAUZE XEROFORM 1X8 LF (GAUZE/BANDAGES/DRESSINGS) ×3 IMPLANT
GLOVE BIO SURGEON STRL SZ7.5 (GLOVE) ×3 IMPLANT
GLOVE BIOGEL PI IND STRL 7.0 (GLOVE) IMPLANT
GLOVE BIOGEL PI IND STRL 7.5 (GLOVE) IMPLANT
GLOVE BIOGEL PI IND STRL 8 (GLOVE) ×1 IMPLANT
GLOVE BIOGEL PI IND STRL 8.5 (GLOVE) IMPLANT
GLOVE BIOGEL PI INDICATOR 7.0 (GLOVE) ×2
GLOVE BIOGEL PI INDICATOR 7.5 (GLOVE) ×4
GLOVE BIOGEL PI INDICATOR 8 (GLOVE) ×2
GLOVE BIOGEL PI INDICATOR 8.5 (GLOVE)
GLOVE SURG ORTHO 8.0 STRL STRW (GLOVE) IMPLANT
GLOVE SURG SYN 7.5  E (GLOVE) ×2
GLOVE SURG SYN 7.5 E (GLOVE) ×1 IMPLANT
GLOVE SURG SYN 7.5 PF PI (GLOVE) IMPLANT
GOWN STRL REUS W/ TWL LRG LVL3 (GOWN DISPOSABLE) ×1 IMPLANT
GOWN STRL REUS W/TWL LRG LVL3 (GOWN DISPOSABLE) ×6
GOWN STRL REUS W/TWL XL LVL3 (GOWN DISPOSABLE) ×5 IMPLANT
GUIDEWIRE ORTHO 0.054X6 (WIRE) ×6 IMPLANT
NDL HYPO 25X1 1.5 SAFETY (NEEDLE) IMPLANT
NEEDLE HYPO 25X1 1.5 SAFETY (NEEDLE) IMPLANT
NS IRRIG 1000ML POUR BTL (IV SOLUTION) ×3 IMPLANT
PACK BASIN DAY SURGERY FS (CUSTOM PROCEDURE TRAY) ×3 IMPLANT
PAD CAST 3X4 CTTN HI CHSV (CAST SUPPLIES) ×1 IMPLANT
PAD CAST 4YDX4 CTTN HI CHSV (CAST SUPPLIES) IMPLANT
PADDING CAST COTTON 3X4 STRL (CAST SUPPLIES) ×3
PADDING CAST COTTON 4X4 STRL (CAST SUPPLIES) ×3
PLATE PROX DISTAL RADIUS LEFT (Plate) ×2 IMPLANT
SCREW CORT FT 18X2.3XLCK HEX (Screw) IMPLANT
SCREW CORT FT 20X2.3XLCK HEX (Screw) IMPLANT
SCREW CORTICAL LOCKING 2.3X16M (Screw) ×3 IMPLANT
SCREW CORTICAL LOCKING 2.3X18M (Screw) ×3 IMPLANT
SCREW CORTICAL LOCKING 2.3X20M (Screw) ×12 IMPLANT
SCREW FX16X2.3XLCK SMTH NS CRT (Screw) IMPLANT
SCREW FX20X2.3XSMTH LCK NS CRT (Screw) IMPLANT
SCREW NLCKG 13 3.5X13 HEXA (Screw) IMPLANT
SCREW NON TOGG 2.3X16MM (Screw) ×2 IMPLANT
SCREW NON-LOCK 3.5X13 (Screw) ×6 IMPLANT
SCREW NONLOCK HEX 3.5X12 (Screw) ×6 IMPLANT
SHEET MEDIUM DRAPE 40X70 STRL (DRAPES) ×2 IMPLANT
SLEEVE SCD COMPRESS KNEE MED (MISCELLANEOUS) IMPLANT
SLING ARM FOAM STRAP MED (SOFTGOODS) ×2 IMPLANT
SPLINT PLASTER CAST XFAST 3X15 (CAST SUPPLIES) IMPLANT
SPLINT PLASTER XTRA FASTSET 3X (CAST SUPPLIES) ×2
STOCKINETTE 4X48 STRL (DRAPES) ×3 IMPLANT
SUT ETHILON 4 0 PS 2 18 (SUTURE) ×5 IMPLANT
SUT VICRYL 4-0 PS2 18IN ABS (SUTURE) ×3 IMPLANT
SYR BULB 3OZ (MISCELLANEOUS) ×3 IMPLANT
SYR CONTROL 10ML LL (SYRINGE) IMPLANT
TOWEL GREEN STERILE FF (TOWEL DISPOSABLE) ×6 IMPLANT
UNDERPAD 30X30 (UNDERPADS AND DIAPERS) ×3 IMPLANT

## 2018-06-29 NOTE — Anesthesia Procedure Notes (Signed)
Anesthesia Regional Block: Axillary brachial plexus block   Pre-Anesthetic Checklist: ,, timeout performed, Correct Patient, Correct Site, Correct Laterality, Correct Procedure, Correct Position, site marked, Risks and benefits discussed,  Surgical consent,  Pre-op evaluation,  At surgeon's request and post-op pain management  Laterality: Left  Prep: chloraprep       Needles:  Injection technique: Single-shot  Needle Type: Stimiplex     Needle Length: 5cm  Needle Gauge: 22     Additional Needles:   Procedures:, nerve stimulator,,,,,,,  Narrative:  Start time: 06/29/2018 10:29 AM End time: 06/29/2018 10:37 AM Injection made incrementally with aspirations every 5 mL.  Performed by: Personally  Anesthesiologist: Eilene Ghazi, MD  Additional Notes: Patient tolerated the procedure well without complications

## 2018-06-29 NOTE — Anesthesia Preprocedure Evaluation (Signed)
Anesthesia Evaluation  Patient identified by MRN, date of birth, ID band Patient awake    Reviewed: Allergy & Precautions, NPO status , Patient's Chart, lab work & pertinent test results  Airway Mallampati: II  TM Distance: >3 FB Neck ROM: Full    Dental no notable dental hx.    Pulmonary neg pulmonary ROS,    Pulmonary exam normal breath sounds clear to auscultation       Cardiovascular negative cardio ROS Normal cardiovascular exam Rhythm:Regular Rate:Normal     Neuro/Psych negative neurological ROS  negative psych ROS   GI/Hepatic Neg liver ROS, GERD  ,  Endo/Other  negative endocrine ROS  Renal/GU negative Renal ROS  negative genitourinary   Musculoskeletal negative musculoskeletal ROS (+)   Abdominal   Peds negative pediatric ROS (+)  Hematology negative hematology ROS (+)   Anesthesia Other Findings   Reproductive/Obstetrics negative OB ROS                             Anesthesia Physical Anesthesia Plan  ASA: II  Anesthesia Plan: General   Post-op Pain Management:  Regional for Post-op pain   Induction: Intravenous  PONV Risk Score and Plan: 3 and Ondansetron, Dexamethasone and Treatment may vary due to age or medical condition  Airway Management Planned: LMA and Oral ETT  Additional Equipment:   Intra-op Plan:   Post-operative Plan: Extubation in OR  Informed Consent: I have reviewed the patients History and Physical, chart, labs and discussed the procedure including the risks, benefits and alternatives for the proposed anesthesia with the patient or authorized representative who has indicated his/her understanding and acceptance.     Dental advisory given  Plan Discussed with: CRNA and Surgeon  Anesthesia Plan Comments:         Anesthesia Quick Evaluation

## 2018-06-29 NOTE — Discharge Instructions (Addendum)
[facility] Center Instructions °Hand Surgery ° °Wound Care: °Keep your hand elevated above the level of your heart.  Do not allow it to dangle by your side.  Keep the dressing dry and do not remove it unless your doctor advises you to do so.  He will usually change it at the time of your post-op visit.  Moving your fingers is advised to stimulate circulation but will depend on the site of your surgery.  If you have a splint applied, your doctor will advise you regarding movement. ° °Activity: °Do not drive or operate machinery today.  Rest today and then you may return to your normal activity and work as indicated by your physician. ° °Diet:  °Drink liquids today or eat a light diet.  You may resume a regular diet tomorrow.   ° °General expectations: °Pain for two to three days. °Fingers may become slightly swollen. ° °Call your doctor if any of the following occur: °Severe pain not relieved by pain medication. °Elevated temperature. °Dressing soaked with blood. °Inability to move fingers. °White or bluish color to fingers. ° ° °Post Anesthesia Home Care Instructions ° °Activity: °Get plenty of rest for the remainder of the day. A responsible individual must stay with you for 24 hours following the procedure.  °For the next 24 hours, DO NOT: °-Drive a car °-Operate machinery °-Drink alcoholic beverages °-Take any medication unless instructed by your physician °-Make any legal decisions or sign important papers. ° °Meals: °Start with liquid foods such as gelatin or soup. Progress to regular foods as tolerated. Avoid greasy, spicy, heavy foods. If nausea and/or vomiting occur, drink only clear liquids until the nausea and/or vomiting subsides. Call your physician if vomiting continues. ° °Special Instructions/Symptoms: °Your throat may feel dry or sore from the anesthesia or the breathing tube placed in your throat during surgery. If this causes discomfort, gargle with warm salt water. The discomfort should disappear within  24 hours. ° °Regional Anesthesia Blocks ° °1. Numbness or the inability to move the "blocked" extremity may last from 3-48 hours after placement. The length of time depends on the medication injected and your individual response to the medication. If the numbness is not going away after 48 hours, call your surgeon. ° °2. The extremity that is blocked will need to be protected until the numbness is gone and the  Strength has returned. Because you cannot feel it, you will need to take extra care to avoid injury. Because it may be weak, you may have difficulty moving it or using it. You may not know what position it is in without looking at it while the block is in effect. ° °3. For blocks in the legs and feet, returning to weight bearing and walking needs to be done carefully. You will need to wait until the numbness is entirely gone and the strength has returned. You should be able to move your leg and foot normally before you try and bear weight or walk. You will need someone to be with you when you first try to ensure you do not fall and possibly risk injury. ° °4. Bruising and tenderness at the needle site are common side effects and will resolve in a few days. ° °5. Persistent numbness or new problems with movement should be communicated to the surgeon or the [facility] ([phone]). °   ° °

## 2018-06-29 NOTE — Op Note (Addendum)
06/29/2018 [facility]  Operative Note  Pre Op Diagnosis: Left comminuted intraarticular distal radius fracture  Post Op Diagnosis: Left comminuted intraarticular distal radius fracture  Procedure:  1. ORIF Left comminuted intraarticular distal radius fracture, 2 intraarticular fragments 2. Left brachioradialis release  Surgeon: Betha LoaKevin Meaghen Vecchiarelli, MD  Assistant: Cindee SaltGary Colburn Asper, MD  Anesthesia: Regional with sedation  Fluids: Per anesthesia flow sheet  EBL: minimal  Complications: None  Specimen: None  Tourniquet Time:  Total Tourniquet Time Documented: Upper Arm (Left) - 76 minutes Total: Upper Arm (Left) - 76 minutes   Disposition: Stable to PACU  INDICATIONS:  Carolyn [name] is a 63 y.o. female states she fell on her deck one week ago injuring left wrist.  Seen in ED where XR revealed distal radius fracture.  Splinted and followed up in office.  We discussed nonoperative and operative treatment options.  She wished to proceed with operative fixation.  Risks, benefits, and alternatives of surgery were discussed including the risk of blood loss; infection; damage to nerves, vessels, tendons, ligaments, bone; failure of surgery; need for additional surgery; complications with wound healing; continued pain; nonunion; malunion; stiffness.  We also discussed the possible need for bone graft and the benefits and risks including the possibility of disease transmission.  She voiced understanding of these risks and elected to proceed.    OPERATIVE COURSE:  After being identified preoperatively by myself, the patient and I agreed upon the procedure and site of procedure.  Surgical site was marked.  The risks, benefits and alternatives of the surgery were reviewed and she wished to proceed.  Surgical consent had been signed.  She was given IV Ancef as preoperative antibiotic prophylaxis.  She was transferred to the operating room and placed on the operating room table in supine  position with the Left upper extremity on an armboard. Sedation was induced by the anesthesiologist.  A regional block had been performed by anesthesia in preoperative holding.  The Left upper extremity was prepped and draped in normal sterile orthopedic fashion.  A surgical pause was performed between the surgeons, anesthesia and operating room staff, and all were in agreement as to the patient, procedure and site of procedure.  Tourniquet at the proximal aspect of the extremity was inflated to 250 mmHg after exsanguination of the limb with an Esmarch bandage.  Standard volar Sherilyn CooterHenry approach was used.  The bipolar electrocautery was used to obtain hemostasis.  The superficial and deep portions of the FCR tendon sheath were incised, and the FCR and FPL were swept ulnarly to protect the palmar cutaneous branch of the median nerve.  The brachioradialis was released at the radial side of the radius.  The pronator quadratus was released and elevated with the periosteal elevator.  The fracture site was identified and cleared of soft tissue interposition and hematoma.  It was reduced under direct visualization.  There was intraarticular extension at the ulnar side of the joint.   There was comminution of the metaphysis.  An [employer]uMed volar distal radial locking plate was selected.  It was secured to the bone with the guidepins.  C-arm was used in AP and lateral projections to ensure appropriate reduction and position of the hardware and adjustments made as necessary.  Standard AO drilling and measuring technique was used.  A screw was placed in the slotted hole in the shaft of the plate and an additional screw in a more proximal hole.  A long plate was used. The distal holes were filled  with locking pegs with the exception of the styloid holes, which were filled with locking screws.  The remaining holes in the shaft of the plate were filled with nonlocking screws.  A screw was placed transversely to capture the larger of the  metaphyseal fragments.  The second metaphyseal fragment was held in place by the plate.  Good purchase was obtained.  C-arm was used in AP, lateral and oblique projections to ensure appropriate reduction and position of hardware, which was the case.  There was no intra-articular penetration of hardware.  The wound was copiously irrigated with sterile saline.  Pronator quadratus was repaired back over top of the plate using 4-0 Vicryl suture.  Vicryl suture was placed in the subcutaneous tissues in an inverted interrupted fashion and the skin was closed with 4-0 nylon in a horizontal mattress fashion.  There was good pronation and supination of the wrist without crepitance.  The wound was then dressed with sterile Xeroform, 4x4s, and wrapped with a Kerlix bandage.  A volar splint was placed and wrapped with Kerlix and Ace bandage.  Tourniquet was deflated at 76 minutes.  Fingertips were pink with brisk capillary refill after deflation of the tourniquet.  Operative drapes were broken down.  The patient was awoken from anesthesia safely.  She was transferred back to the stretcher and taken to the PACU in stable condition.  I will see her back in the office in one week for postoperative followup.  I will give her a prescription for Percocet 5/325 1-2 tabs PO q6 hours prn pain, dispense # 30.  She will continue her current regimen for chronic pain and use the newly prescribed medication for treatment of the surgical pain.    Betha Loa, MD Electronically signed, 06/29/18

## 2018-06-29 NOTE — Progress Notes (Signed)
Assisted Dr. Rose with left, ultrasound guided, supraclavicular block. Side rails up, monitors on throughout procedure. See vital signs in flow sheet. Tolerated Procedure well. 

## 2018-06-29 NOTE — Anesthesia Postprocedure Evaluation (Signed)
Anesthesia Post Note  Patient: Carolyn [name]  Procedure(s) Performed: OPEN REDUCTION INTERNAL FIXATION (ORIF)LEFT  DISTAL RADIAL FRACTURE (Left Wrist)     Patient location during evaluation: PACU Anesthesia Type: Regional and MAC Level of consciousness: awake and alert Pain management: pain level controlled Vital Signs Assessment: post-procedure vital signs reviewed and stable Respiratory status: spontaneous breathing, nonlabored ventilation, respiratory function stable and patient connected to nasal cannula oxygen Cardiovascular status: stable and blood pressure returned to baseline Postop Assessment: no apparent nausea or vomiting Anesthetic complications: no    Last Vitals:  Vitals:   06/29/18 1330 06/29/18 1344  BP:  (!) 154/87  Pulse: 64 63  Resp:  15  Temp:  36.7 C  SpO2: 97% 100%    Last Pain:  Vitals:   06/29/18 1344  TempSrc:   PainSc: 0-No pain                 Shannen Flansburg S

## 2018-06-29 NOTE — Transfer of Care (Signed)
Immediate Anesthesia Transfer of Care Note  Patient: ALEIA LAROCCA  Procedure(s) Performed: Procedure(s) (LRB): OPEN REDUCTION INTERNAL FIXATION (ORIF)LEFT  DISTAL RADIAL FRACTURE (Left)  Patient Location: PACU  Anesthesia Type: MAC  Level of Consciousness: awake, alert , oriented and patient cooperative  Airway & Oxygen Therapy: Patient Spontanous Breathing and Patient connected to face mask oxygen  Post-op Assessment: Report given to PACU RN and Post -op Vital signs reviewed and stable  Post vital signs: Reviewed and stable  Complications: No apparent anesthesia complications  Last Vitals:  Vitals Value Taken Time  BP 140/86 06/29/2018 12:57 PM  Temp    Pulse 63 06/29/2018 12:59 PM  Resp 8 06/29/2018 12:59 PM  SpO2 100 % 06/29/2018 12:59 PM  Vitals shown include unvalidated device data.  Last Pain:  Vitals:   06/29/18 0946  TempSrc: Oral  PainSc: 4       Patients Stated Pain Goal: 8 (06/29/18 0946)

## 2018-06-29 NOTE — H&P (Signed)
Carolyn [name] is an 63 y.o. female.   Chief Complaint: left wrist fracture HPI: 63 yo female states she fell on her deck one week ago injuring left wrist.  Seen in ED where XR revealed distal radius fracture.  Splinted and followed up in office.  She wishes to proceed with operative fixation.  Allergies:  Allergies  Allergen Reactions  . Peanut-Containing Drug Products Anaphylaxis    Tree nuts    Past Medical History:  Diagnosis Date  . Chronic pancreatitis (HCC)   . Endometriosis   . GERD (gastroesophageal reflux disease)   . Liver disease   . Malabsorption    MALABSORPTION SYNDROME  . Osteoporosis 03/2011   t score -2.5  . Pancreatitis    due to cyst and tumors due to calcifications  . Seizure (HCC)    no seizure disorder  . Vitamin D deficiency 2012   VIT D 15    Past Surgical History:  Procedure Laterality Date  . APPENDECTOMY    . CHOLECYSTECTOMY    . FEEDING TUBE RELOCATION  2010  . JEJUNOSTOMY FEEDING TUBE  1990  . MULTIPLE GASTRIC SURGERIES    . MYOMECTOMY    . ROUX-EN-Y GASTRIC BYPASS    . TUBAL LIGATION      Family History: Family History  Problem Relation Age of Onset  . Cancer Father        BONE  . Breast cancer Maternal Grandmother     Social History:   reports that she has never smoked. She has never used smokeless tobacco. She reports previous alcohol use. She reports that she does not use drugs.  Medications: Medications Prior to Admission  Medication Sig Dispense Refill  . calcium carbonate (OS-CAL - DOSED IN MG OF ELEMENTAL CALCIUM) 1250 MG tablet Take 1 tablet by mouth daily.    . famotidine (PEPCID) 10 MG tablet Take 10 mg by mouth 2 (two) times daily.      . fentaNYL (DURAGESIC - DOSED MCG/HR) 100 MCG/HR Place 1 patch onto the skin every other day.     Marland Kitchen HYDROmorphone (DILAUDID) 2 MG tablet Take 4 mg by mouth 2 (two) times daily.     Marland Kitchen ibuprofen (ADVIL,MOTRIN) 200 MG tablet Take 600 mg by mouth every 6 (six) hours as needed.    .  lipase/protease/amylase (CREON) 12000 UNITS CPEP Take 2 capsules by mouth 3 (three) times daily with meals.    . Multiple Vitamin (MULITIVITAMIN WITH MINERALS) TABS Take 1 tablet by mouth daily.    . ondansetron (ZOFRAN) 4 MG tablet Take 4 mg by mouth every 8 (eight) hours as needed.    . traZODone (DESYREL) 50 MG tablet Take 50 mg by mouth at bedtime.        No results found for this or any previous visit (from the past 48 hour(s)).  No results found.   A comprehensive review of systems was negative.  Blood pressure 115/63, pulse 73, temperature 98.8 F (37.1 C), temperature source Oral, resp. rate 12, height 5\' 8"  (1.727 m), weight 47 kg, last menstrual period 03/10/2009, SpO2 97 %.  General appearance: alert, cooperative and appears stated age Head: Normocephalic, without obvious abnormality, atraumatic Neck: supple, symmetrical, trachea midline Cardio: regular rate and rhythm Resp: clear to auscultation bilaterally Extremities: Intact sensation and capillary refill all digits.  +epl/fpl/io.  No wounds.  Pulses: 2+ and symmetric Skin: Skin color, texture, turgor normal. No rashes or lesions Neurologic: Grossly normal Incision/Wound: none  Assessment/Plan Left distal radius fracture.  Non operative and operative treatment options have been discussed with the patient and patient wishes to proceed with operative treatment. Risks, benefits, and alternatives of surgery have been discussed and the patient agrees with the plan of care.  We also discussed possible need for bone graft and the low but possible risk of disease transmission.  She voiced understanding of these risks and elected to proceed.    Betha LoaKevin Jayshaun Phillips 06/29/2018, 10:47 AM

## 2018-06-29 NOTE — Op Note (Signed)
Intra-operative fluoroscopic images in the AP, lateral, and oblique views were taken and evaluated by myself.  Reduction and hardware placement were confirmed.  There was no intraarticular penetration of permanent hardware.  

## 2018-06-29 NOTE — Op Note (Signed)
I assisted Surgeon(s) and Role:    Betha Loa, MD - Primary on the Procedure(s): OPEN REDUCTION INTERNAL FIXATION (ORIF)LEFT  DISTAL RADIAL FRACTURE on 06/29/2018.  I provided assistance on this case as follows: setup,approach, isolation,debridement,reduction,stabilization and fixation of the fracture, followed by closure of the incisions and application of the dressings and splint.  Electronically signed by: Cindee Salt, MD Date: 06/29/2018 Time: 12:51 PM

## 2018-07-03 ENCOUNTER — Encounter (HOSPITAL_BASED_OUTPATIENT_CLINIC_OR_DEPARTMENT_OTHER): Payer: Self-pay | Admitting: Orthopedic Surgery

## 2018-08-24 ENCOUNTER — Encounter: Payer: Self-pay | Admitting: Gastroenterology

## 2018-08-24 ENCOUNTER — Ambulatory Visit (INDEPENDENT_AMBULATORY_CARE_PROVIDER_SITE_OTHER): Payer: Medicare Other | Admitting: Gastroenterology

## 2018-08-24 ENCOUNTER — Other Ambulatory Visit: Payer: Self-pay

## 2018-08-24 VITALS — Ht 68.0 in | Wt 103.0 lb

## 2018-08-24 DIAGNOSIS — K7030 Alcoholic cirrhosis of liver without ascites: Secondary | ICD-10-CM

## 2018-08-24 DIAGNOSIS — D696 Thrombocytopenia, unspecified: Secondary | ICD-10-CM | POA: Diagnosis not present

## 2018-08-24 DIAGNOSIS — K860 Alcohol-induced chronic pancreatitis: Secondary | ICD-10-CM

## 2018-08-24 MED ORDER — PANCRELIPASE (LIP-PROT-AMYL) 12000-38000 UNITS PO CPEP: 24000.0000 [IU] | ORAL_CAPSULE | Freq: Three times a day (TID) | ORAL | 2 refills | Status: DC

## 2018-08-24 NOTE — Progress Notes (Signed)
TELEHEALTH VISIT  Referring Provider: London Pepper, MD Primary Care Physician:  London Pepper, MD   Tele-visit due to COVID-19 pandemic Patient requested visit virtually, consented to the virtual encounter via video enabled telemedicine application (Failed Zoom, switched to [place] made at: 11:02 Patient verified by name and date of birth Location of patient: Home Location provider: [facility] Names of persons participating: Me, patient, Tinnie Gens CMA Time spent on telehealth visit: 45 minutes I discussed the limitations of evaluation and management by telemedicine. The patient expressed understanding and agreed to proceed.  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 Abstinence from alcohol is critical to her long term health. I have asked her to establish one-on-one formal alcohol counseling.   Will obtain labs to restage her liver disease, imaging to screen for hepatocellular carcinoma, and endoscopy to screen for esophageal varices. Will obtain labs first to assess for coagulopathy and progressive thrombocytopenia increasing the risks of endoscopy.  She has never had any colon cancer screening. She does not feel she is a candidate for colonoscopy based on prior conversation with Dr. Launa Flight. Will plan  Cologuard now.  She requested a refill of Creon for her chronic pancreatitis. She agrees to follow-up with the [facility]. I made it clear that I do not prescribe opiates in the setting of cirrhosis.    PLAN: Abdominal ultrasound CMP, CBC, PT/INR, AFP HAV antibody, HBcAb total, HBsAg ANA, IgG, ASMA Iron, ferritin Vitamin D level  Cologuard EGD to screen for esophageal varices Creon refill Obtain prior labs from [facility] Pneumovax and flu vaccines recommended Obtain records from Dr. Oletta Lamas (prior office visits, endoscopy, pathology results, imaging) Formal alcohol counseling recommended  I have encouraged her to reestablish care with Dr. Orland Mustard Follow-up encounter after the EGD to review these results\ Office visit every 6 months  I consented the patient discussing the risks, benefits, and alternatives to endoscopic evaluation. In particular, we discussed the risks that include, but are not limited to, reaction to medication, cardiopulmonary compromise, bleeding requiring blood transfusion, aspiration resulting in pneumonia, perforation requiring surgery, lack of diagnosis, severe illness requiring hospitalization, and even death. We reviewed the risk of missed lesion including polyps or even cancer. The patient acknowledges these risks and asks that we proceed.  HPI: Carolyn [name] is a 63 y.o. female self-referred for liver disease.  The history is obtained through the patient, review of her electronic health record, and review of 28 pages of records from the [facility] from 2017.  It appears she was followed there from 2015-20 17.She has been followed by Dr. Oletta Lamas at [place] for several years.  The patient reports that Dr. Oletta Lamas from [facility] suggested that she establish care  with me.   She has a long history of alcoholism.  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 Launa Flight and continued to drink for many more years  after the surgery.  She was previously on Creon and feels like she may need to resume Creon now because of the symptoms that she experiences when eating fat. She manages her pain through Fentanyl patches through [facility].  She had a J-tube for many years but now has good pain control and had the J-tube removed.  She has required opioid pain medications to control her pancreatic pain.  She had an open reduction internal fixation of the left comminuted intra-articular distal radius fracture earlier this year after she tripped on her deck while carrying a plantar.  She has had intermittent periods of sobriety. Last stopped drinking 3 weeks. Formal counseling at [facility] in the past. Longest period of sobriety was 2-3 years in 2015-2018. She is open to formal counseling. DUI 3 years ago.    She follows a low sodium diet. She is unsure of HAV and HBV vaccine. She has not had a pneumovax. 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   No history of decompensation including jaundice, variceal bleeding, encephalopathy, ascites.  No varices on EGD at [facility] 07/25/2013.  The most recent abdominal imaging available to me is an abdominal ultrasound from 05/21/2013 showing hepatic steatosis, pneumobilia consistent with prior surgery, and no acute findings.  No prior blood donation.  No prior blood transfusion.  No history of use or experimentation with IV or intranasal street drugs.  No history of autoimmune disease.    She understands that Dr. Launa Flight at [facility] she has too much scar tissue from her prior surgeries and adhesions to have a colonoscopy. Stool testing have been negative.   The most recent labs available to me are from 09/30/2017 showing: Sodium 137, potassium 2.9, glucose 116, BUN 25, creatinine 0.84, albumin 4.6, AST 104, ALT 50, alk phos 121, total bilirubin 1.4.  WBC 4.6, hemoglobin 12.1, platelets 64.  Ammonia 14.  Evaluation in 2017, the [facility] liver clinic did not feel that she need to be evaluated for  liver transplant and thought that her prior surgeries may be a barrier to transplant.  Father died of bone cancer but he had cirrhosis due to alcohol. No known family history of liver disease or liver cancer, colon cancer or polyps. No family history of uterine/endometrial cancer, pancreatic cancer or gastric/stomach cancer.  Past Medical History:  Diagnosis Date  . Chronic pancreatitis ([place])   . Endometriosis   . GERD (gastroesophageal reflux disease)   . Liver disease   . Malabsorption    MALABSORPTION SYNDROME  . Osteoporosis 03/2011   t score -2.5  . Pancreatitis    due to cyst and tumors due to calcifications  . Seizure ([place])    no seizure disorder  . Vitamin D deficiency 2012   VIT D 15    Past Surgical History:  Procedure Laterality Date  . APPENDECTOMY    . CHOLECYSTECTOMY    . FEEDING TUBE RELOCATION  2010  . JEJUNOSTOMY FEEDING TUBE  1990  . MULTIPLE GASTRIC SURGERIES    . MYOMECTOMY    . OPEN REDUCTION INTERNAL FIXATION (ORIF) DISTAL RADIAL FRACTURE Left 06/29/2018   Procedure: OPEN REDUCTION INTERNAL FIXATION (ORIF)LEFT  DISTAL RADIAL FRACTURE;  Surgeon: Leanora Cover, MD;  Location: [place];  Service: Orthopedics;  Laterality: Left;  . ROUX-EN-Y  GASTRIC BYPASS    . TUBAL LIGATION      Current Outpatient Medications  Medication Sig Dispense Refill  . calcium carbonate (OS-CAL - DOSED IN MG OF ELEMENTAL CALCIUM) 1250 MG tablet Take 1 tablet by mouth daily.    . famotidine (PEPCID) 10 MG tablet Take 10 mg by mouth 2 (two) times daily.      . fentaNYL (DURAGESIC - DOSED MCG/HR) 100 MCG/HR Place 1 patch onto the skin every other day.     Marland Kitchen HYDROmorphone (DILAUDID) 2 MG tablet Take 4 mg by mouth 2 (two) times daily.     Marland Kitchen ibuprofen (ADVIL,MOTRIN) 200 MG tablet Take 600 mg by mouth every 6 (six) hours as needed.    . lipase/protease/amylase (CREON) 12000 UNITS CPEP Take 2 capsules by mouth 3 (three) times daily with meals.    . Multiple Vitamin  (MULITIVITAMIN WITH MINERALS) TABS Take 1 tablet by mouth daily.    . ondansetron (ZOFRAN) 4 MG tablet Take 4 mg by mouth every 8 (eight) hours as needed.    Marland Kitchen oxyCODONE-acetaminophen (PERCOCET) 5-325 MG tablet 1-2 tabs PO q6 hours prn pain 30 tablet 0  . traZODone (DESYREL) 50 MG tablet Take 50 mg by mouth at bedtime.       No current facility-administered medications for this visit.     Allergies as of 08/24/2018 - Review Complete 08/24/2018  Allergen Reaction Noted  . Peanut-containing drug products Anaphylaxis 03/11/2011    Family History  Problem Relation Age of Onset  . Cancer Father        BONE  . Breast cancer Maternal Grandmother     Social History   Socioeconomic History  . Marital status: Single    Spouse name: Not on file  . Number of children: Not on file  . Years of education: Not on file  . Highest education level: Not on file  Occupational History  . Not on file  Social Needs  . Financial resource strain: Not on file  . Food insecurity    Worry: Not on file    Inability: Not on file  . Transportation needs    Medical: Not on file    Non-medical: Not on file  Tobacco Use  . Smoking status: Never Smoker  . Smokeless tobacco: Never Used  Substance and Sexual Activity  . Alcohol use: Not Currently  . Drug use: No  . Sexual activity: Never    Partners: Male  Lifestyle  . Physical activity    Days per week: Not on file    Minutes per session: Not on file  . Stress: Not on file  Relationships  . Social Herbalist on phone: Not on file    Gets together: Not on file    Attends religious service: Not on file    Active member of club or organization: Not on file    Attends meetings of clubs or organizations: Not on file    Relationship status: Not on file  . Intimate partner violence    Fear of current or ex partner: Not on file    Emotionally abused: Not on file    Physically abused: Not on file    Forced sexual activity: Not on file   Other Topics Concern  . Not on file  Social History Narrative  . Not on file    Review of Systems: ALL ROS discussed and all others negative except listed in HPI.  Physical Exam: General: in no acute distress  Neuro: Alert and appropriate Psych: Normal affect and normal insight   Lavra Imler L. Tarri Glenn, MD, MPH [facility] 08/24/2018, 11:14 AM

## 2018-08-24 NOTE — Patient Instructions (Addendum)
You should have an imaging study every 6 months to monitor for the development of hepatocellular carcinoma (liver cancer). The risk is low, but, if liver cancer is diagnosed early, there are better treatment options.   I have recommended an EGD for further evaluation of possible varices.   I have recommended some additional labs.   Work to maintain a health weight. Minimize salt intake. Stay active. Weight-based exercise is recommended.  I recommend that you not drink any alcohol including beer, wine, liquor, and non-alcoholic beer. I recommend that you start some kind of formal alcohol counseling.   I have recommend that you be vaccinated for hepatitis A, hepatitis B, the seasonal flu vaccine and the Pneumovax, if you have not already had them.   The [employer] and UpToDate are good websites for information about your liver.   Thank you for your patience with me and our technology today! Please stay home, safe, and healthy. I look forward to meeting you in person in the future.

## 2018-08-28 NOTE — Addendum Note (Signed)
Addended by: Wyline Beady on: 08/28/2018 09:06 AM   Modules accepted: Orders

## 2018-09-01 ENCOUNTER — Encounter: Payer: Medicare Other | Admitting: Gastroenterology

## 2018-09-05 ENCOUNTER — Telehealth: Payer: Self-pay | Admitting: Gastroenterology

## 2018-09-05 NOTE — Telephone Encounter (Signed)
LM on vmail to call back regarding Covid-19 questions ° ° °Covid-19 Screening Questions: °Do you now or have you had a fever in the last 14 days?  °Do you have any respiratory symptoms of shortness of breath or cough now or in the last 14 days?  °Do you have any family members or close contacts with diagnosed or suspected Covid-19 in the past 14 days?  °Have you been tested for Covid-19 and found to be positive?  °

## 2018-09-06 ENCOUNTER — Telehealth: Payer: Self-pay | Admitting: Gastroenterology

## 2018-09-06 ENCOUNTER — Encounter: Payer: Medicare Other | Admitting: Gastroenterology

## 2018-09-06 NOTE — Telephone Encounter (Signed)
Left a voicemail for Carolyn [name] to call back and reschedule Endoscopy.

## 2018-09-13 ENCOUNTER — Other Ambulatory Visit: Payer: Medicare Other

## 2018-09-25 ENCOUNTER — Other Ambulatory Visit: Payer: Medicare Other

## 2018-10-02 ENCOUNTER — Other Ambulatory Visit: Payer: Medicare Other

## 2019-02-19 ENCOUNTER — Telehealth: Payer: Self-pay

## 2019-02-19 NOTE — Telephone Encounter (Signed)
Cologuard ordered  On 08-28-2018. Order has not been completed

## 2020-01-31 ENCOUNTER — Emergency Department (HOSPITAL_COMMUNITY)
Admission: EM | Admit: 2020-01-31 | Discharge: 2020-01-31 | Disposition: A | Discharge status: Home / Self Care | Payer: Medicare PPO | Attending: Emergency Medicine | Admitting: Emergency Medicine

## 2020-01-31 ENCOUNTER — Emergency Department (HOSPITAL_COMMUNITY): Payer: Medicare PPO

## 2020-01-31 ENCOUNTER — Encounter (HOSPITAL_COMMUNITY): Payer: Self-pay

## 2020-01-31 ENCOUNTER — Other Ambulatory Visit: Payer: Self-pay

## 2020-01-31 DIAGNOSIS — R0600 Dyspnea, unspecified: Secondary | ICD-10-CM

## 2020-01-31 DIAGNOSIS — Z91010 Allergy to peanuts: Secondary | ICD-10-CM | POA: Diagnosis not present

## 2020-01-31 DIAGNOSIS — F10920 Alcohol use, unspecified with intoxication, uncomplicated: Secondary | ICD-10-CM

## 2020-01-31 DIAGNOSIS — Z79899 Other long term (current) drug therapy: Secondary | ICD-10-CM | POA: Diagnosis not present

## 2020-01-31 DIAGNOSIS — Z20822 Contact with and (suspected) exposure to covid-19: Secondary | ICD-10-CM | POA: Diagnosis not present

## 2020-01-31 DIAGNOSIS — R0602 Shortness of breath: Secondary | ICD-10-CM | POA: Diagnosis not present

## 2020-01-31 LAB — COMPREHENSIVE METABOLIC PANEL
ALT: 44 U/L (ref 0–44)
AST: 121 U/L — ABNORMAL HIGH (ref 15–41)
Albumin: 3.9 g/dL (ref 3.5–5.0)
Alkaline Phosphatase: 132 U/L — ABNORMAL HIGH (ref 38–126)
Anion gap: 13 (ref 5–15)
BUN: 15 mg/dL (ref 8–23)
CO2: 32 mmol/L (ref 22–32)
Calcium: 8.2 mg/dL — ABNORMAL LOW (ref 8.9–10.3)
Chloride: 96 mmol/L — ABNORMAL LOW (ref 98–111)
Creatinine, Ser: 0.71 mg/dL (ref 0.44–1.00)
GFR, Estimated: >60 mL/min (ref >60)
Glucose, Bld: 91 mg/dL (ref 70–99)
Potassium: 3.6 mmol/L (ref 3.5–5.1)
Sodium: 141 mmol/L (ref 135–145)
Total Bilirubin: 0.7 mg/dL (ref 0.3–1.2)
Total Protein: 6.8 g/dL (ref 6.5–8.1)

## 2020-01-31 LAB — RESP PANEL BY RT-PCR (FLU A&B, COVID) ARPGX2
Influenza A by PCR: NEGATIVE
Influenza B by PCR: NEGATIVE
SARS Coronavirus 2 by RT PCR: NEGATIVE

## 2020-01-31 LAB — CBC WITH DIFFERENTIAL/PLATELET
Abs Immature Granulocytes: 0.01 10*3/uL (ref 0.00–0.07)
Basophils Absolute: 0 10*3/uL (ref 0.0–0.1)
Basophils Relative: 1 %
Eosinophils Absolute: 0.1 10*3/uL (ref 0.0–0.5)
Eosinophils Relative: 2 %
HCT: 31.4 % — ABNORMAL LOW (ref 36.0–46.0)
Hemoglobin: 10.4 g/dL — ABNORMAL LOW (ref 12.0–15.0)
Immature Granulocytes: 0 %
Lymphocytes Relative: 41 %
Lymphs Abs: 2.1 10*3/uL (ref 0.7–4.0)
MCH: 33.4 pg (ref 26.0–34.0)
MCHC: 33.1 g/dL (ref 30.0–36.0)
MCV: 101 fL — ABNORMAL HIGH (ref 80.0–100.0)
Monocytes Absolute: 0.3 10*3/uL (ref 0.1–1.0)
Monocytes Relative: 6 %
Neutro Abs: 2.6 10*3/uL (ref 1.7–7.7)
Neutrophils Relative %: 50 %
Platelets: 61 10*3/uL — ABNORMAL LOW (ref 150–400)
RBC: 3.11 MIL/uL — ABNORMAL LOW (ref 3.87–5.11)
RDW: 12.3 % (ref 11.5–15.5)
WBC: 5.1 10*3/uL (ref 4.0–10.5)
nRBC: 0 % (ref 0.0–0.2)

## 2020-01-31 LAB — ETHANOL: Alcohol, Ethyl (B): 315 mg/dL (ref <10)

## 2020-01-31 LAB — TROPONIN I (HIGH SENSITIVITY): Troponin I (High Sensitivity): <2 ng/L (ref <18)

## 2020-01-31 LAB — LACTIC ACID, PLASMA: Lactic Acid, Venous: 1.6 mmol/L (ref 0.5–1.9)

## 2020-01-31 MED ORDER — LORAZEPAM 2 MG/ML IJ SOLN
0.5000 mg | Freq: Once | INTRAMUSCULAR | Status: AC
  Administered 2020-01-31: 0.5 mg via INTRAVENOUS
  Filled 2020-01-31: qty 1

## 2020-01-31 NOTE — ED Provider Notes (Signed)
5:05 PM-patient presented by driving her own vehicle, and was found confused, trying to enter the hospital through a one-way entrance.  She was brought into the department by EMS.  She complained of shortness of breath, which is recurrent and has been evaluated twice recently at [facility].  7:45 PM-patient's husband is here now and helps to give history.  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  Husband is aware of her drinking problem.  He states that she seems to have trouble when sleeping sometimes but not at others.  He feels like she has upper airway congestion.  At this time oxygen saturation is 100% on 2 L nasal cannula oxygen.  I remove the oxygen, and there was no immediate decrease in her oxygen saturation.  She will be watched for desaturation, and then discharged if not requiring oxygen.   Mancel Bale, MD 01/31/20 [phone]

## 2020-01-31 NOTE — ED Notes (Signed)
IV team at bedside 

## 2020-01-31 NOTE — ED Notes (Signed)
Date and time results received: 01/31/20 1857   Test: Alcohol   Critical Value: 315  Name of Provider Notified: Mancel Bale MD   Orders Received? Or Actions Taken?: Acknowledged

## 2020-01-31 NOTE — ED Notes (Signed)
Discharge instructions discussed with patient. Verbalized understanding of follow up care. Assisted to bathroom and then wheeled to lobby in stable condition.

## 2020-01-31 NOTE — ED Notes (Signed)
Phlebotomy called to collect blood work at this time.

## 2020-01-31 NOTE — ED Triage Notes (Signed)
Pt BIB in by EMS. Pt was found with heavy breathing at the wrong entrance way of this hospital. EMS was called to transport to ER area. Pt is complaining of SOB for the past week. Pt was recently seen for the same complaint 2x this week.    BP-130/70 O2-100%

## 2020-01-31 NOTE — ED Provider Notes (Signed)
[facility]-EMERGENCY DEPT Provider Note   CSN: [id number] Arrival date & time: 01/31/20  1406     History Chief Complaint  Patient presents with  . Shortness of Breath    Carolyn [name] is a 64 y.o. female.  63 year old female with prior history as detailed below presents for evaluation primarily of reported dyspnea. Symptoms started 7 days prior. No fever. No cough.   Of note, patient with two prior visits to [facility] facility earlier this week (11/11 and 11/15) for same complaint. Workup was extensive - including CTA Chest. No significant acute pathology was found on work-up.  Patient was discharged after those visits.  Patient presents now to this facility for reevaluation.  Patient's reported complaints are without significant change as compared to presenting complaints from [facility] facilities earlier this week.  The history is provided by the patient and medical records.  Shortness of Breath Severity:  Mild Onset quality:  Gradual Duration:  1 week      Past Medical History:  Diagnosis Date  . Chronic pancreatitis (HCC)   . Endometriosis   . GERD (gastroesophageal reflux disease)   . Liver disease   . Malabsorption    MALABSORPTION SYNDROME  . Osteoporosis 03/2011   t score -2.5  . Pancreatitis    due to cyst and tumors due to calcifications  . Seizure (HCC)    no seizure disorder  . Vitamin D deficiency 2012   VIT D 15    Patient Active Problem List   Diagnosis Date Noted  . Osteoporosis 03/16/2011  . Endometriosis   . Malabsorption   . Pancreatitis   . Seizure (HCC)   . Vitamin D deficiency     Past Surgical History:  Procedure Laterality Date  . APPENDECTOMY    . CHOLECYSTECTOMY    . FEEDING TUBE RELOCATION  2010  . JEJUNOSTOMY FEEDING TUBE  1990  . MULTIPLE GASTRIC SURGERIES    . MYOMECTOMY    . OPEN REDUCTION INTERNAL FIXATION (ORIF) DISTAL RADIAL FRACTURE Left 06/29/2018   Procedure: OPEN REDUCTION INTERNAL FIXATION  (ORIF)LEFT  DISTAL RADIAL FRACTURE;  Surgeon: Betha Loa, MD;  Location: [facility];  Service: Orthopedics;  Laterality: Left;  . ROUX-EN-Y GASTRIC BYPASS    . TUBAL LIGATION       OB History    Gravida  0   Para      Term      Preterm      AB      Living        SAB      TAB      Ectopic      Multiple      Live Births              Family History  Problem Relation Age of Onset  . Cancer Father        BONE  . Breast cancer Maternal Grandmother     Social History   Tobacco Use  . Smoking status: Never Smoker  . Smokeless tobacco: Never Used  Substance Use Topics  . Alcohol use: Not Currently  . Drug use: No    Home Medications Prior to Admission medications   Medication Sig Start Date End Date Taking? Authorizing Provider  calcium carbonate (OS-CAL - DOSED IN MG OF ELEMENTAL CALCIUM) 1250 MG tablet Take 1 tablet by mouth daily.    [provider]  famotidine (PEPCID) 10 MG tablet Take 10 mg by mouth 2 (  two) times daily.      [provider]  fentaNYL (DURAGESIC - DOSED MCG/HR) 100 MCG/HR Place 1 patch onto the skin every other day.     [provider]  HYDROmorphone (DILAUDID) 2 MG tablet Take 4 mg by mouth 2 (two) times daily.     [provider]  ibuprofen (ADVIL,MOTRIN) 200 MG tablet Take 600 mg by mouth every 6 (six) hours as needed.    [provider]  lipase/protease/amylase (CREON) 12000 units CPEP capsule Take 2 capsules (24,000 Units total) by mouth 3 (three) times daily with meals. 08/24/18   Tressia Danas, MD  Multiple Vitamin (MULITIVITAMIN WITH MINERALS) TABS Take 1 tablet by mouth daily.    [provider]  ondansetron (ZOFRAN) 4 MG tablet Take 4 mg by mouth every 8 (eight) hours as needed.    [provider]  oxyCODONE-acetaminophen (PERCOCET) 5-325 MG tablet 1-2 tabs PO q6 hours prn pain 06/29/18   Betha Loa, MD  traZODone (DESYREL) 50 MG tablet Take 50 mg  by mouth at bedtime.      [provider]  PROMETHAZINE HCL PO Take 4 mg by mouth.   06/06/11  [provider]  Ranitidine HCl (ZANTAC PO) Take by mouth.    06/06/11  [provider]    Allergies    Peanut-containing drug products  Review of Systems   Review of Systems  Respiratory: Positive for shortness of breath.   All other systems reviewed and are negative.   Physical Exam Updated Vital Signs BP 131/85   Pulse 96   Temp 98.4 F (36.9 C) (Oral)   Resp 15   Ht 5\' 8"  (1.727 m)   Wt 45.4 kg   LMP 03/10/2009   SpO2 92%   BMI 15.20 kg/m   Physical Exam Vitals and nursing note reviewed.  Constitutional:      General: She is not in acute distress.    Appearance: She is well-developed.  HENT:     Head: Normocephalic and atraumatic.  Eyes:     Conjunctiva/sclera: Conjunctivae normal.     Pupils: Pupils are equal, round, and reactive to light.  Cardiovascular:     Rate and Rhythm: Normal rate and regular rhythm.     Heart sounds: Normal heart sounds.  Pulmonary:     Effort: Pulmonary effort is normal. No respiratory distress.     Breath sounds: Normal breath sounds.  Abdominal:     General: There is no distension.     Palpations: Abdomen is soft.     Tenderness: There is no abdominal tenderness.  Musculoskeletal:        General: No deformity. Normal range of motion.     Cervical back: Normal range of motion and neck supple.  Skin:    General: Skin is warm and dry.  Neurological:     General: No focal deficit present.     Mental Status: She is alert and oriented to person, place, and time.     ED Results / Procedures / Treatments   Labs (all labs ordered are listed, but only abnormal results are displayed) Labs Reviewed  RESP PANEL BY RT-PCR (FLU A&B, COVID) ARPGX2  ETHANOL  COMPREHENSIVE METABOLIC PANEL  CBC WITH DIFFERENTIAL/PLATELET  LACTIC ACID, PLASMA  LACTIC ACID, PLASMA  TROPONIN I (HIGH SENSITIVITY)  TROPONIN I (HIGH  SENSITIVITY)    EKG EKG Interpretation  Date/Time:  Thursday January 31 2020 15:24:17 EST Ventricular Rate:  90 PR Interval:    QRS  Duration: 81 QT Interval:  358 QTC Calculation: 438 R Axis:   63 Text Interpretation: Sinus rhythm Abnormal R-wave progression, early transition Confirmed by Kristine Royal [phone]) on 01/31/2020 3:28:21 PM   Radiology DG Chest Port 1 View  Result Date: 01/31/2020 CLINICAL DATA:  Dyspnea EXAM: PORTABLE CHEST 1 VIEW COMPARISON:  01/24/2020 FINDINGS: The heart size and mediastinal contours are within normal limits. No focal airspace consolidation, pleural effusion, or pneumothorax. Chronic healed fractures of the distal right clavicle and posterior left ribs. IMPRESSION: No active disease. Electronically Signed   By: Duanne Guess D.O.   On: 01/31/2020 15:52    Procedures Procedures (including critical care time)  Medications Ordered in ED Medications  LORazepam (ATIVAN) injection 0.5 mg (has no administration in time range)    ED Course  I have reviewed the triage vital signs and the nursing notes.  Pertinent labs & imaging results that were available during my care of the patient were reviewed by me and considered in my medical decision making (see chart for details).    MDM Rules/Calculators/A&P                          MDM  Screen complete  Carolyn [name] was evaluated in Emergency Department on 01/31/2020 for the symptoms described in the history of present illness. She was evaluated in the context of the global COVID-19 pandemic, which necessitated consideration that the patient might be at risk for infection with the SARS-CoV-2 virus that causes COVID-19. Institutional protocols and algorithms that pertain to the evaluation of patients at risk for COVID-19 are in a state of rapid change based on information released by regulatory bodies including the CDC and federal and state organizations. These policies and algorithms were followed  during the patient's care in the ED.  Patient is presenting for reported SOB.  Patient with multiple prior recent workups at [facility] facility earlier this week for same complaint. Prior workups did not reveal significant pathology and patient was discharged.  Patient is now presenting for re-evaluation.   Will obtain repeat screening labs, CXR, EKG.   Dr. Effie Shy aware of pending studies and disposition.    Final Clinical Impression(s) / ED Diagnoses Final diagnoses:  Dyspnea, unspecified type    Rx / DC Orders ED Discharge Orders    None       Wynetta Fines, MD 01/31/20 1705

## 2020-01-31 NOTE — Discharge Instructions (Addendum)
Your alcohol level was very high and you are intoxicated when you drove here.  This is very dangerous for you and anyone else who might be injured by you.  Avoid drinking all forms of alcohol.  Follow-up with alcoholics anonymous.  For trouble breathing you can use the medication previously prescribed.  See your doctor for checkup next week.

## 2020-06-12 ENCOUNTER — Encounter (HOSPITAL_BASED_OUTPATIENT_CLINIC_OR_DEPARTMENT_OTHER): Payer: Self-pay

## 2020-06-12 ENCOUNTER — Emergency Department (HOSPITAL_BASED_OUTPATIENT_CLINIC_OR_DEPARTMENT_OTHER): Payer: Medicare PPO

## 2020-06-12 ENCOUNTER — Other Ambulatory Visit: Payer: Self-pay

## 2020-06-12 ENCOUNTER — Inpatient Hospital Stay (HOSPITAL_BASED_OUTPATIENT_CLINIC_OR_DEPARTMENT_OTHER)
Admission: EM | Admit: 2020-06-12 | Discharge: 2020-06-14 | DRG: 682 | Disposition: A | Discharge status: Home Health | Payer: Medicare PPO | Attending: Family Medicine | Admitting: Family Medicine

## 2020-06-12 DIAGNOSIS — K7030 Alcoholic cirrhosis of liver without ascites: Secondary | ICD-10-CM | POA: Diagnosis present

## 2020-06-12 DIAGNOSIS — Z20822 Contact with and (suspected) exposure to covid-19: Secondary | ICD-10-CM | POA: Diagnosis present

## 2020-06-12 DIAGNOSIS — K7010 Alcoholic hepatitis without ascites: Secondary | ICD-10-CM | POA: Diagnosis not present

## 2020-06-12 DIAGNOSIS — F10229 Alcohol dependence with intoxication, unspecified: Secondary | ICD-10-CM | POA: Diagnosis present

## 2020-06-12 DIAGNOSIS — W19XXXA Unspecified fall, initial encounter: Secondary | ICD-10-CM

## 2020-06-12 DIAGNOSIS — M810 Age-related osteoporosis without current pathological fracture: Secondary | ICD-10-CM | POA: Diagnosis not present

## 2020-06-12 DIAGNOSIS — Z79891 Long term (current) use of opiate analgesic: Secondary | ICD-10-CM

## 2020-06-12 DIAGNOSIS — E860 Dehydration: Secondary | ICD-10-CM

## 2020-06-12 DIAGNOSIS — E8889 Other specified metabolic disorders: Secondary | ICD-10-CM | POA: Diagnosis present

## 2020-06-12 DIAGNOSIS — D696 Thrombocytopenia, unspecified: Secondary | ICD-10-CM | POA: Diagnosis not present

## 2020-06-12 DIAGNOSIS — K860 Alcohol-induced chronic pancreatitis: Secondary | ICD-10-CM | POA: Diagnosis present

## 2020-06-12 DIAGNOSIS — R2681 Unsteadiness on feet: Secondary | ICD-10-CM

## 2020-06-12 DIAGNOSIS — Y906 Blood alcohol level of 120-199 mg/100 ml: Secondary | ICD-10-CM | POA: Diagnosis not present

## 2020-06-12 DIAGNOSIS — W010XXA Fall on same level from slipping, tripping and stumbling without subsequent striking against object, initial encounter: Secondary | ICD-10-CM | POA: Diagnosis not present

## 2020-06-12 DIAGNOSIS — E872 Acidosis: Secondary | ICD-10-CM | POA: Diagnosis present

## 2020-06-12 DIAGNOSIS — Z23 Encounter for immunization: Secondary | ICD-10-CM | POA: Diagnosis not present

## 2020-06-12 DIAGNOSIS — E8729 Other acidosis: Secondary | ICD-10-CM

## 2020-06-12 DIAGNOSIS — E43 Unspecified severe protein-calorie malnutrition: Secondary | ICD-10-CM | POA: Diagnosis not present

## 2020-06-12 DIAGNOSIS — R54 Age-related physical debility: Secondary | ICD-10-CM | POA: Diagnosis present

## 2020-06-12 DIAGNOSIS — K219 Gastro-esophageal reflux disease without esophagitis: Secondary | ICD-10-CM | POA: Diagnosis not present

## 2020-06-12 DIAGNOSIS — D638 Anemia in other chronic diseases classified elsewhere: Secondary | ICD-10-CM | POA: Diagnosis present

## 2020-06-12 DIAGNOSIS — K861 Other chronic pancreatitis: Secondary | ICD-10-CM | POA: Diagnosis present

## 2020-06-12 DIAGNOSIS — E876 Hypokalemia: Secondary | ICD-10-CM | POA: Diagnosis present

## 2020-06-12 DIAGNOSIS — S022XXA Fracture of nasal bones, initial encounter for closed fracture: Secondary | ICD-10-CM | POA: Diagnosis not present

## 2020-06-12 DIAGNOSIS — R112 Nausea with vomiting, unspecified: Secondary | ICD-10-CM | POA: Diagnosis present

## 2020-06-12 DIAGNOSIS — Z681 Body mass index (BMI) 19 or less, adult: Secondary | ICD-10-CM | POA: Diagnosis not present

## 2020-06-12 DIAGNOSIS — R059 Cough, unspecified: Secondary | ICD-10-CM

## 2020-06-12 DIAGNOSIS — S0083XA Contusion of other part of head, initial encounter: Secondary | ICD-10-CM

## 2020-06-12 DIAGNOSIS — Z803 Family history of malignant neoplasm of breast: Secondary | ICD-10-CM

## 2020-06-12 DIAGNOSIS — N179 Acute kidney failure, unspecified: Secondary | ICD-10-CM | POA: Diagnosis not present

## 2020-06-12 DIAGNOSIS — Y92009 Unspecified place in unspecified non-institutional (private) residence as the place of occurrence of the external cause: Secondary | ICD-10-CM | POA: Diagnosis not present

## 2020-06-12 DIAGNOSIS — G8929 Other chronic pain: Secondary | ICD-10-CM | POA: Diagnosis present

## 2020-06-12 HISTORY — DX: Alcoholic cirrhosis of liver without ascites: K70.30

## 2020-06-12 LAB — MAGNESIUM: Magnesium: 2 mg/dL (ref 1.7–2.4)

## 2020-06-12 LAB — CBC WITH DIFFERENTIAL/PLATELET
Abs Immature Granulocytes: 0.05 10*3/uL (ref 0.00–0.07)
Basophils Absolute: 0.1 10*3/uL (ref 0.0–0.1)
Basophils Relative: 1 %
Eosinophils Absolute: 0 10*3/uL (ref 0.0–0.5)
Eosinophils Relative: 0 %
HCT: 38 % (ref 36.0–46.0)
Hemoglobin: 12.7 g/dL (ref 12.0–15.0)
Immature Granulocytes: 1 %
Lymphocytes Relative: 10 %
Lymphs Abs: 1 10*3/uL (ref 0.7–4.0)
MCH: 31.7 pg (ref 26.0–34.0)
MCHC: 33.4 g/dL (ref 30.0–36.0)
MCV: 94.8 fL (ref 80.0–100.0)
Monocytes Absolute: 0.4 10*3/uL (ref 0.1–1.0)
Monocytes Relative: 4 %
Neutro Abs: 8.5 10*3/uL — ABNORMAL HIGH (ref 1.7–7.7)
Neutrophils Relative %: 84 %
Platelets: 170 10*3/uL (ref 150–400)
RBC: 4.01 MIL/uL (ref 3.87–5.11)
RDW: 12.3 % (ref 11.5–15.5)
WBC: 9.9 10*3/uL (ref 4.0–10.5)
nRBC: 0 % (ref 0.0–0.2)

## 2020-06-12 LAB — BASIC METABOLIC PANEL
Anion gap: 27 — ABNORMAL HIGH (ref 5–15)
BUN: 23 mg/dL (ref 8–23)
CO2: 20 mmol/L — ABNORMAL LOW (ref 22–32)
Calcium: 8.5 mg/dL — ABNORMAL LOW (ref 8.9–10.3)
Chloride: 94 mmol/L — ABNORMAL LOW (ref 98–111)
Creatinine, Ser: 1.05 mg/dL — ABNORMAL HIGH (ref 0.44–1.00)
GFR, Estimated: 60 mL/min — ABNORMAL LOW (ref >60)
Glucose, Bld: 117 mg/dL — ABNORMAL HIGH (ref 70–99)
Potassium: 4.7 mmol/L (ref 3.5–5.1)
Sodium: 141 mmol/L (ref 135–145)

## 2020-06-12 LAB — LIPASE, BLOOD: Lipase: 20 U/L (ref 11–51)

## 2020-06-12 LAB — ETHANOL: Alcohol, Ethyl (B): 151 mg/dL — ABNORMAL HIGH (ref <10)

## 2020-06-12 LAB — RESP PANEL BY RT-PCR (FLU A&B, COVID) ARPGX2
Influenza A by PCR: NEGATIVE
Influenza B by PCR: NEGATIVE
SARS Coronavirus 2 by RT PCR: NEGATIVE

## 2020-06-12 LAB — COMPREHENSIVE METABOLIC PANEL
ALT: 72 U/L — ABNORMAL HIGH (ref 0–44)
AST: 293 U/L — ABNORMAL HIGH (ref 15–41)
Albumin: 3.2 g/dL — ABNORMAL LOW (ref 3.5–5.0)
Alkaline Phosphatase: 145 U/L — ABNORMAL HIGH (ref 38–126)
Anion gap: 15 (ref 5–15)
BUN: 19 mg/dL (ref 8–23)
CO2: 24 mmol/L (ref 22–32)
Calcium: 7.7 mg/dL — ABNORMAL LOW (ref 8.9–10.3)
Chloride: 99 mmol/L (ref 98–111)
Creatinine, Ser: 0.86 mg/dL (ref 0.44–1.00)
GFR, Estimated: >60 mL/min (ref >60)
Glucose, Bld: 351 mg/dL — ABNORMAL HIGH (ref 70–99)
Potassium: 3.8 mmol/L (ref 3.5–5.1)
Sodium: 138 mmol/L (ref 135–145)
Total Bilirubin: 1.5 mg/dL — ABNORMAL HIGH (ref 0.3–1.2)
Total Protein: 5.9 g/dL — ABNORMAL LOW (ref 6.5–8.1)

## 2020-06-12 LAB — HEPATIC FUNCTION PANEL
ALT: 75 U/L — ABNORMAL HIGH (ref 0–44)
AST: 252 U/L — ABNORMAL HIGH (ref 15–41)
Albumin: 3.8 g/dL (ref 3.5–5.0)
Alkaline Phosphatase: 135 U/L — ABNORMAL HIGH (ref 38–126)
Bilirubin, Direct: 0.4 mg/dL — ABNORMAL HIGH (ref 0.0–0.2)
Indirect Bilirubin: 1.2 mg/dL — ABNORMAL HIGH (ref 0.3–0.9)
Total Bilirubin: 1.6 mg/dL — ABNORMAL HIGH (ref 0.3–1.2)
Total Protein: 7.1 g/dL (ref 6.5–8.1)

## 2020-06-12 LAB — HIV ANTIBODY (ROUTINE TESTING W REFLEX): HIV Screen 4th Generation wRfx: NONREACTIVE

## 2020-06-12 LAB — PROTIME-INR
INR: 1.1 (ref 0.8–1.2)
Prothrombin Time: 13.8 seconds (ref 11.4–15.2)

## 2020-06-12 LAB — PHOSPHORUS: Phosphorus: 5 mg/dL — ABNORMAL HIGH (ref 2.5–4.6)

## 2020-06-12 MED ORDER — METOPROLOL TARTRATE 5 MG/5ML IV SOLN: 5.0000 mg | Freq: Four times a day (QID) | INTRAVENOUS | Status: DC | PRN

## 2020-06-12 MED ORDER — SODIUM CHLORIDE 0.9 % IV SOLN: Freq: Once | INTRAVENOUS | Status: AC

## 2020-06-12 MED ORDER — THIAMINE HCL 100 MG PO TABS
100.0000 mg | ORAL_TABLET | Freq: Every day | ORAL | Status: DC
  Administered 2020-06-13 – 2020-06-14 (×2): 100 mg via ORAL
  Filled 2020-06-12 (×2): qty 1

## 2020-06-12 MED ORDER — SODIUM CHLORIDE 0.9 % IV BOLUS
1000.0000 mL | Freq: Once | INTRAVENOUS | Status: AC
  Administered 2020-06-12: 1000 mL via INTRAVENOUS

## 2020-06-12 MED ORDER — KETOROLAC TROMETHAMINE 15 MG/ML IJ SOLN: 15.0000 mg | Freq: Four times a day (QID) | INTRAMUSCULAR | Status: DC | PRN

## 2020-06-12 MED ORDER — OXYCODONE HCL 5 MG PO TABS: 5.0000 mg | ORAL_TABLET | ORAL | Status: DC | PRN

## 2020-06-12 MED ORDER — ENOXAPARIN SODIUM 40 MG/0.4ML ~~LOC~~ SOLN
40.0000 mg | SUBCUTANEOUS | Status: DC
  Administered 2020-06-12 – 2020-06-13 (×2): 40 mg via SUBCUTANEOUS
  Filled 2020-06-12 (×2): qty 0.4

## 2020-06-12 MED ORDER — ADULT MULTIVITAMIN W/MINERALS CH
1.0000 | ORAL_TABLET | Freq: Every day | ORAL | Status: DC
  Administered 2020-06-13 – 2020-06-14 (×2): 1 via ORAL
  Filled 2020-06-12 (×2): qty 1

## 2020-06-12 MED ORDER — THIAMINE HCL 100 MG/ML IJ SOLN
100.0000 mg | Freq: Every day | INTRAMUSCULAR | Status: DC
  Administered 2020-06-12: 100 mg via INTRAVENOUS
  Filled 2020-06-12: qty 2

## 2020-06-12 MED ORDER — SODIUM CHLORIDE 0.9% FLUSH
3.0000 mL | Freq: Two times a day (BID) | INTRAVENOUS | Status: DC
  Administered 2020-06-13 – 2020-06-14 (×3): 3 mL via INTRAVENOUS

## 2020-06-12 MED ORDER — ONDANSETRON HCL 4 MG PO TABS: 4.0000 mg | ORAL_TABLET | Freq: Four times a day (QID) | ORAL | Status: DC | PRN

## 2020-06-12 MED ORDER — THIAMINE HCL 100 MG/ML IJ SOLN
Freq: Once | INTRAVENOUS | Status: AC
  Filled 2020-06-12: qty 1000

## 2020-06-12 MED ORDER — PANCRELIPASE (LIP-PROT-AMYL) 12000-38000 UNITS PO CPEP
24000.0000 [IU] | ORAL_CAPSULE | Freq: Three times a day (TID) | ORAL | Status: DC
  Administered 2020-06-13 – 2020-06-14 (×3): 24000 [IU] via ORAL
  Filled 2020-06-12 (×4): qty 2

## 2020-06-12 MED ORDER — ONDANSETRON HCL 4 MG/2ML IJ SOLN
4.0000 mg | Freq: Once | INTRAMUSCULAR | Status: AC
  Administered 2020-06-12: 4 mg via INTRAVENOUS
  Filled 2020-06-12: qty 2

## 2020-06-12 MED ORDER — FOLIC ACID 1 MG PO TABS
1.0000 mg | ORAL_TABLET | Freq: Every day | ORAL | Status: DC
  Administered 2020-06-13 – 2020-06-14 (×2): 1 mg via ORAL
  Filled 2020-06-12 (×2): qty 1

## 2020-06-12 MED ORDER — TETANUS-DIPHTH-ACELL PERTUSSIS 5-2.5-18.5 LF-MCG/0.5 IM SUSY
0.5000 mL | PREFILLED_SYRINGE | Freq: Once | INTRAMUSCULAR | Status: AC
  Administered 2020-06-12: 0.5 mL via INTRAMUSCULAR
  Filled 2020-06-12: qty 0.5

## 2020-06-12 MED ORDER — LORAZEPAM 1 MG PO TABS: 1.0000 mg | ORAL_TABLET | ORAL | Status: DC | PRN

## 2020-06-12 MED ORDER — THIAMINE HCL 100 MG/ML IJ SOLN: Freq: Once | INTRAVENOUS | Status: DC

## 2020-06-12 MED ORDER — LORAZEPAM 1 MG PO TABS: 0.0000 mg | ORAL_TABLET | Freq: Two times a day (BID) | ORAL | Status: DC

## 2020-06-12 MED ORDER — THIAMINE HCL 100 MG PO TABS: 100.0000 mg | ORAL_TABLET | Freq: Every day | ORAL | Status: DC

## 2020-06-12 MED ORDER — ONDANSETRON HCL 4 MG/2ML IJ SOLN
4.0000 mg | Freq: Four times a day (QID) | INTRAMUSCULAR | Status: DC | PRN
  Administered 2020-06-13: 4 mg via INTRAVENOUS
  Filled 2020-06-12: qty 2

## 2020-06-12 MED ORDER — LORAZEPAM 2 MG/ML IJ SOLN: 0.0000 mg | Freq: Two times a day (BID) | INTRAMUSCULAR | Status: DC

## 2020-06-12 MED ORDER — LORAZEPAM 2 MG/ML IJ SOLN
0.0000 mg | Freq: Four times a day (QID) | INTRAMUSCULAR | Status: DC
  Administered 2020-06-12: 1 mg via INTRAVENOUS
  Administered 2020-06-12: 2 mg via INTRAVENOUS
  Filled 2020-06-12 (×2): qty 1

## 2020-06-12 MED ORDER — LORAZEPAM 1 MG PO TABS: 0.0000 mg | ORAL_TABLET | Freq: Four times a day (QID) | ORAL | Status: DC

## 2020-06-12 MED ORDER — LORAZEPAM 2 MG/ML IJ SOLN: 1.0000 mg | INTRAMUSCULAR | Status: DC | PRN

## 2020-06-12 NOTE — ED Provider Notes (Signed)
[facility] EMERGENCY DEPARTMENT Provider Note   CSN: [id number] Arrival date & time: 06/12/20  0221     History Chief Complaint  Patient presents with  . Fall    Carolyn [name] is a 65 y.o. female.  HPI     This is a 65 year old female with a history of cirrhosis, chronic pancreatitis, alcohol abuse who presents following a fall.  Patient reports that yesterday morning around 6 AM she got up and tripped and fell.  She describes a mechanical fall.  She states she did not lose consciousness.  She reports pain in the face, neck, and left hip.  Upon going to bed tonight she had onset of nausea and vomiting.  She also reports some blurry vision.  She reports a mild headache.  She is not on any blood thinners.  Husband noted significant bruising to the bilateral eyes.  Patient reports that she has not had alcohol in over 24 hours since before her fall.  She reports ongoing shortness of breath which is not new for her.  Denies chest pain, fevers.  Past Medical History:  Diagnosis Date  . Alcoholic cirrhosis (HCC)   . Chronic pancreatitis (HCC)   . Endometriosis   . GERD (gastroesophageal reflux disease)   . Liver disease   . Malabsorption    MALABSORPTION SYNDROME  . Osteoporosis 03/2011   t score -2.5  . Pancreatitis    due to cyst and tumors due to calcifications  . Seizure (HCC)    no seizure disorder  . Vitamin D deficiency 2012   VIT D 15    Patient Active Problem List   Diagnosis Date Noted  . Osteoporosis 03/16/2011  . Endometriosis   . Malabsorption   . Pancreatitis   . Seizure (HCC)   . Vitamin D deficiency     Past Surgical History:  Procedure Laterality Date  . APPENDECTOMY    . CHOLECYSTECTOMY    . FEEDING TUBE RELOCATION  2010  . JEJUNOSTOMY FEEDING TUBE  1990  . MULTIPLE GASTRIC SURGERIES    . MYOMECTOMY    . OPEN REDUCTION INTERNAL FIXATION (ORIF) DISTAL RADIAL FRACTURE Left 06/29/2018   Procedure: OPEN REDUCTION INTERNAL FIXATION  (ORIF)LEFT  DISTAL RADIAL FRACTURE;  Surgeon: Betha Loa, MD;  Location: [facility];  Service: Orthopedics;  Laterality: Left;  . ROUX-EN-Y GASTRIC BYPASS    . TUBAL LIGATION       OB History    Gravida  0   Para      Term      Preterm      AB      Living        SAB      IAB      Ectopic      Multiple      Live Births              Family History  Problem Relation Age of Onset  . Cancer Father        BONE  . Breast cancer Maternal Grandmother     Social History   Tobacco Use  . Smoking status: Never Smoker  . Smokeless tobacco: Never Used  Substance Use Topics  . Alcohol use: Not Currently  . Drug use: No    Home Medications Prior to Admission medications   Medication Sig Start Date End Date Taking? Authorizing Provider  calcium carbonate (OS-CAL - DOSED IN MG OF ELEMENTAL CALCIUM) 1250 MG tablet Take 1 tablet by  mouth daily.    [provider]  famotidine (PEPCID) 10 MG tablet Take 10 mg by mouth 2 (two) times daily.      [provider]  fentaNYL (DURAGESIC - DOSED MCG/HR) 100 MCG/HR Place 1 patch onto the skin every other day.     [provider]  HYDROmorphone (DILAUDID) 2 MG tablet Take 4 mg by mouth 2 (two) times daily.     [provider]  ibuprofen (ADVIL,MOTRIN) 200 MG tablet Take 600 mg by mouth every 6 (six) hours as needed.    [provider]  lipase/protease/amylase (CREON) 12000 units CPEP capsule Take 2 capsules (24,000 Units total) by mouth 3 (three) times daily with meals. 08/24/18   Tressia Danas, MD  Multiple Vitamin (MULITIVITAMIN WITH MINERALS) TABS Take 1 tablet by mouth daily.    [provider]  ondansetron (ZOFRAN) 4 MG tablet Take 4 mg by mouth every 8 (eight) hours as needed.    [provider]  oxyCODONE-acetaminophen (PERCOCET) 5-325 MG tablet 1-2 tabs PO q6 hours prn pain 06/29/18   Betha Loa, MD  traZODone (DESYREL) 50 MG tablet Take 50 mg  by mouth at bedtime.      [provider]  PROMETHAZINE HCL PO Take 4 mg by mouth.   06/06/11  [provider]  Ranitidine HCl (ZANTAC PO) Take by mouth.    06/06/11  [provider]    Allergies    Peanut-containing drug products  Review of Systems   Review of Systems  Constitutional: Negative for fever.  Eyes: Positive for visual disturbance.  Respiratory: Positive for shortness of breath. Negative for cough.   Cardiovascular: Negative for chest pain.  Gastrointestinal: Positive for nausea and vomiting. Negative for abdominal pain.  Neurological: Positive for headaches.  All other systems reviewed and are negative.   Physical Exam Updated Vital Signs BP 136/71 (BP Location: Right Arm)   Pulse (!) 125   Temp 98.5 F (36.9 C) (Oral)   Resp (!) 22   Ht 1.727 m ( )   Wt 45.4 kg   LMP 03/10/2009   SpO2 93%   BMI 15.20 kg/m   Physical Exam Vitals and nursing note reviewed.  Constitutional:      Appearance: She is well-developed.     Comments: Chronically ill-appearing, cachectic, thin  HENT:     Head: Normocephalic and atraumatic.     Nose:     Comments: Swelling noted over the nasal bridge, dried blood noted, blood noted in the bilateral naris, no septal hematoma    Mouth/Throat:     Mouth: Mucous membranes are dry.  Eyes:     Extraocular Movements: Extraocular movements intact.     Pupils: Pupils are equal, round, and reactive to light.     Comments: Raccoon eyes bilaterally  Neck:     Comments: Tenderness palpation C7, no step-off or deformity Cardiovascular:     Rate and Rhythm: Regular rhythm. Tachycardia present.     Heart sounds: Normal heart sounds.  Pulmonary:     Effort: Pulmonary effort is normal. No respiratory distress.     Breath sounds: No wheezing.  Abdominal:     General: Bowel sounds are normal.     Palpations: Abdomen is soft.     Tenderness: There is no abdominal tenderness.  Musculoskeletal:     Cervical back:  Normal range of motion and neck supple.     Right lower leg: No edema.     Comments: Normal range of  motion left hip, bruising  Skin:    General: Skin is warm and dry.  Neurological:     Mental Status: She is alert and oriented to person, place, and time.  Psychiatric:        Mood and Affect: Mood normal.     ED Results / Procedures / Treatments   Labs (all labs ordered are listed, but only abnormal results are displayed) Labs Reviewed  CBC WITH DIFFERENTIAL/PLATELET - Abnormal; Notable for the following components:      Result Value   Neutro Abs 8.5 (*)    All other components within normal limits  BASIC METABOLIC PANEL - Abnormal; Notable for the following components:   Chloride 94 (*)    CO2 20 (*)    Glucose, Bld 117 (*)    Creatinine, Ser 1.05 (*)    Calcium 8.5 (*)    GFR, Estimated 60 (*)    Anion gap 27 (*)    All other components within normal limits  ETHANOL - Abnormal; Notable for the following components:   Alcohol, Ethyl (B) 151 (*)    All other components within normal limits  RESP PANEL BY RT-PCR (FLU A&B, COVID) [id number]    EKG None  Radiology DG Chest 2 View  Result Date: 06/12/2020 CLINICAL DATA:  Fall EXAM: CHEST - 2 VIEW COMPARISON:  None. FINDINGS: The heart size and mediastinal contours are within normal limits. Both lungs are clear. The visualized skeletal structures are unremarkable. IMPRESSION: No active cardiopulmonary disease. Electronically Signed   By: Deatra Robinson M.D.   On: 06/12/2020 03:14   CT Head Wo Contrast  Addendum Date: 06/12/2020   ADDENDUM REPORT: 06/12/2020 03:59 ADDENDUM: These results were called by telephone at the time of interpretation on 06/12/2020 at 3:59 am to provider Kush Farabee , who verbally acknowledged these results. Electronically Signed   By: Tish Frederickson M.D.   On: 06/12/2020 03:59   Result Date: 06/12/2020 CLINICAL DATA:  Mechanical fall yesterday. Nausea and vomiting. History of seizure. EXAM: CT HEAD  WITHOUT CONTRAST CT MAXILLOFACIAL WITHOUT CONTRAST CT CERVICAL SPINE WITHOUT CONTRAST TECHNIQUE: Multidetector CT imaging of the head, cervical spine, and maxillofacial structures were performed using the standard protocol without intravenous contrast. Multiplanar CT image reconstructions of the cervical spine and maxillofacial structures were also generated. COMPARISON:  X-ray cervical spine 06/23/1999 report without imaging FINDINGS: CT HEAD FINDINGS Brain: Patchy and confluent areas of decreased attenuation are noted throughout the deep and periventricular white matter of the cerebral hemispheres bilaterally, compatible with chronic microvascular ischemic disease. No evidence of large-territorial acute infarction. No parenchymal hemorrhage. No mass lesion. No extra-axial collection. No mass effect or midline shift. No hydrocephalus. Basilar cisterns are patent. Vascular: No hyperdense vessel. Skull: No acute fracture or focal lesion. Other: None. CT MAXILLOFACIAL FINDINGS Osseous: Acute comminuted bilateral, right greater than left, nasal bone fracture. Medially displaced right nasal bone fracture. Sinuses/Orbits: Mucosal thickening of the left sphenoid sinus. Otherwise the remaining paranasal sinuses and mastoid air cells are clear. The orbits are unremarkable. Soft tissues: Subcutaneus soft tissue edema of the nose and maxilla. CT CERVICAL SPINE FINDINGS Alignment: Normal. Skull base and vertebrae: No acute fracture. No aggressive appearing focal osseous lesion or focal pathologic process. Soft tissues and spinal canal: No prevertebral fluid or swelling. No visible canal hematoma. Upper chest: Biapical pleural/pulmonary scarring. Calcified 2 mm left apical nodule. Other: Trace debris within the trachea. IMPRESSION: 1. No acute intracranial abnormality. 2. Acute comminuted and displaced bilateral nasal bone fracture.  3. No acute displaced fracture or traumatic listhesis of the cervical spine. 4. Trace debris  within the trachea. Electronically Signed: By: Tish Frederickson M.D. On: 06/12/2020 03:38   CT Cervical Spine Wo Contrast  Addendum Date: 06/12/2020   ADDENDUM REPORT: 06/12/2020 03:59 ADDENDUM: These results were called by telephone at the time of interpretation on 06/12/2020 at 3:59 am to provider Bracy Pepper , who verbally acknowledged these results. Electronically Signed   By: Tish Frederickson M.D.   On: 06/12/2020 03:59   Result Date: 06/12/2020 CLINICAL DATA:  Mechanical fall yesterday. Nausea and vomiting. History of seizure. EXAM: CT HEAD WITHOUT CONTRAST CT MAXILLOFACIAL WITHOUT CONTRAST CT CERVICAL SPINE WITHOUT CONTRAST TECHNIQUE: Multidetector CT imaging of the head, cervical spine, and maxillofacial structures were performed using the standard protocol without intravenous contrast. Multiplanar CT image reconstructions of the cervical spine and maxillofacial structures were also generated. COMPARISON:  X-ray cervical spine 06/23/1999 report without imaging FINDINGS: CT HEAD FINDINGS Brain: Patchy and confluent areas of decreased attenuation are noted throughout the deep and periventricular white matter of the cerebral hemispheres bilaterally, compatible with chronic microvascular ischemic disease. No evidence of large-territorial acute infarction. No parenchymal hemorrhage. No mass lesion. No extra-axial collection. No mass effect or midline shift. No hydrocephalus. Basilar cisterns are patent. Vascular: No hyperdense vessel. Skull: No acute fracture or focal lesion. Other: None. CT MAXILLOFACIAL FINDINGS Osseous: Acute comminuted bilateral, right greater than left, nasal bone fracture. Medially displaced right nasal bone fracture. Sinuses/Orbits: Mucosal thickening of the left sphenoid sinus. Otherwise the remaining paranasal sinuses and mastoid air cells are clear. The orbits are unremarkable. Soft tissues: Subcutaneus soft tissue edema of the nose and maxilla. CT CERVICAL SPINE FINDINGS  Alignment: Normal. Skull base and vertebrae: No acute fracture. No aggressive appearing focal osseous lesion or focal pathologic process. Soft tissues and spinal canal: No prevertebral fluid or swelling. No visible canal hematoma. Upper chest: Biapical pleural/pulmonary scarring. Calcified 2 mm left apical nodule. Other: Trace debris within the trachea. IMPRESSION: 1. No acute intracranial abnormality. 2. Acute comminuted and displaced bilateral nasal bone fracture. 3. No acute displaced fracture or traumatic listhesis of the cervical spine. 4. Trace debris within the trachea. Electronically Signed: By: Tish Frederickson M.D. On: 06/12/2020 03:38   DG Hip Unilat W or Wo Pelvis 2-3 Views Left  Result Date: 06/12/2020 CLINICAL DATA:  Fall EXAM: DG HIP (WITH OR WITHOUT PELVIS) 2-3V LEFT COMPARISON:  None. FINDINGS: There is no evidence of hip fracture or dislocation. There is no evidence of arthropathy or other focal bone abnormality. IMPRESSION: Negative. Electronically Signed   By: Deatra Robinson M.D.   On: 06/12/2020 03:15   CT Maxillofacial Wo Contrast  Addendum Date: 06/12/2020   ADDENDUM REPORT: 06/12/2020 03:59 ADDENDUM: These results were called by telephone at the time of interpretation on 06/12/2020 at 3:59 am to provider Findley Vi , who verbally acknowledged these results. Electronically Signed   By: Tish Frederickson M.D.   On: 06/12/2020 03:59   Result Date: 06/12/2020 CLINICAL DATA:  Mechanical fall yesterday. Nausea and vomiting. History of seizure. EXAM: CT HEAD WITHOUT CONTRAST CT MAXILLOFACIAL WITHOUT CONTRAST CT CERVICAL SPINE WITHOUT CONTRAST TECHNIQUE: Multidetector CT imaging of the head, cervical spine, and maxillofacial structures were performed using the standard protocol without intravenous contrast. Multiplanar CT image reconstructions of the cervical spine and maxillofacial structures were also generated. COMPARISON:  X-ray cervical spine 06/23/1999 report without imaging FINDINGS:  CT HEAD FINDINGS Brain: Patchy and confluent areas of decreased attenuation  are noted throughout the deep and periventricular white matter of the cerebral hemispheres bilaterally, compatible with chronic microvascular ischemic disease. No evidence of large-territorial acute infarction. No parenchymal hemorrhage. No mass lesion. No extra-axial collection. No mass effect or midline shift. No hydrocephalus. Basilar cisterns are patent. Vascular: No hyperdense vessel. Skull: No acute fracture or focal lesion. Other: None. CT MAXILLOFACIAL FINDINGS Osseous: Acute comminuted bilateral, right greater than left, nasal bone fracture. Medially displaced right nasal bone fracture. Sinuses/Orbits: Mucosal thickening of the left sphenoid sinus. Otherwise the remaining paranasal sinuses and mastoid air cells are clear. The orbits are unremarkable. Soft tissues: Subcutaneus soft tissue edema of the nose and maxilla. CT CERVICAL SPINE FINDINGS Alignment: Normal. Skull base and vertebrae: No acute fracture. No aggressive appearing focal osseous lesion or focal pathologic process. Soft tissues and spinal canal: No prevertebral fluid or swelling. No visible canal hematoma. Upper chest: Biapical pleural/pulmonary scarring. Calcified 2 mm left apical nodule. Other: Trace debris within the trachea. IMPRESSION: 1. No acute intracranial abnormality. 2. Acute comminuted and displaced bilateral nasal bone fracture. 3. No acute displaced fracture or traumatic listhesis of the cervical spine. 4. Trace debris within the trachea. Electronically Signed: By: Tish Frederickson M.D. On: 06/12/2020 03:38    Procedures Procedures    CRITICAL CARE Performed by: Shon Baton   Total critical care time: 45 minutes  Critical care time was exclusive of separately billable procedures and treating other patients.  Critical care was necessary to treat or prevent imminent or life-threatening deterioration.  Critical care was time spent  personally by me on the following activities: development of treatment plan with patient and/or surrogate as well as nursing, discussions with consultants, evaluation of patient's response to treatment, examination of patient, obtaining history from patient or surrogate, ordering and performing treatments and interventions, ordering and review of laboratory studies, ordering and review of radiographic studies, pulse oximetry and re-evaluation of patient's condition.  Angiocath insertion Performed by: Shon Baton  Consent: Verbal consent obtained. Risks and benefits: risks, benefits and alternatives were discussed Time out: Immediately prior to procedure a "time out" was called to verify the correct patient, procedure, equipment, support staff and site/side marked as required.  Preparation: Patient was prepped and draped in the usual sterile fashion.  Vein Location: left basilic  Ultrasound Guided  Gauge: 20  Normal blood return and flush without difficulty Patient tolerance: Patient tolerated the procedure well with no immediate complications.    Medications Ordered in ED Medications  LORazepam (ATIVAN) injection 0-4 mg (has no administration in time range)    Or  LORazepam (ATIVAN) tablet 0-4 mg (has no administration in time range)  LORazepam (ATIVAN) injection 0-4 mg (has no administration in time range)    Or  LORazepam (ATIVAN) tablet 0-4 mg (has no administration in time range)  thiamine tablet 100 mg (has no administration in time range)    Or  thiamine (B-1) injection 100 mg (has no administration in time range)  sodium chloride 0.9 % bolus 1,000 mL (has no administration in time range)  ondansetron (ZOFRAN) injection 4 mg (4 mg Intravenous Given 06/12/20 0416)  sodium chloride 0.9 % bolus 1,000 mL ( Intravenous Infusion Verify 06/12/20 0508)  Tdap (BOOSTRIX) injection 0.5 mL (0.5 mLs Intramuscular Given 06/12/20 0439)    ED Course  I have reviewed the triage vital  signs and the nursing notes.  Pertinent labs & imaging results that were available during my care of the patient were reviewed by me and  considered in my medical decision making (see chart for details).  Clinical Course as of 06/12/20 0519  Thu Jun 12, 2020  0433 Spoke with the patient and her husband.  She states that she is not drinking "much."  She reports her last alcoholic drink was over 24 hours ago prior to the fall.  She does have an anion gap of 27.  No history of diabetes.  She clinically appears dry and her creatinine is slightly elevated.  She will receive fluids.  Added alcohol level given history. [CH]    Clinical Course User Index [CH] Keyon Liller, Mayer Maskerourtney F, MD   MDM Rules/Calculators/A&P                          Patient presents with a reported mechanical fall approximately 24 hours prior to arrival.  She has significant bruising of the bilateral orbits and swelling of the nasal bridge.  Vital signs notable for tachycardia with heart rate 123.  EKG shows sinus tachycardia without arrhythmia.  Otherwise she is hemodynamically stable.  She is very frail and cachectic appearing.  Work-up initiated.  Imaging including x-rays of the chest and left hip obtained.  Additionally CT head, neck, and face obtained.  CT scans only notable for bilateral nasal bone fractures.  Tetanus was updated.  Patient is very clinically dry appearing.  Labs were ordered and patient was given fluids.  Labs notable for AKI with creatinine of 1.05 and an anion gap of 27.  No history of diabetes and glucose is 117.  Given patient history of significant alcohol abuse, highly suspect AKA.  Initially, patient only endorsed drinking "a little" and last drink 24 hours ago.  However, blood alcohol level is 151.  Highly suspect that she has been drinking more heavily.  Husband reports that she "kind of fell off the wagon" around her birthday.  She is drinking daily and excessively at this time.  She remains significantly  tachycardic with fluids going.  Do not believe I will be able to correct her metabolic derangements.  I had a long discussion with the patient regarding her alcohol abuse.  I discussed with her that it is causing her significant morbidity at this time.  She and husband are agreeable with admission.  She was placed on CIWA protocol.  Additional fluids and labs were ordered in addition to thiamine.  Covid testing also placed. Final Clinical Impression(s) / ED Diagnoses Final diagnoses:  Alcoholic ketoacidosis  Dehydration  Contusion of face, initial encounter  Closed fracture of nasal bone, initial encounter    Rx / DC Orders ED Discharge Orders    None       Shon BatonHorton, Takeyla Million F, MD 06/12/20 [phone]

## 2020-06-12 NOTE — H&P (Signed)
History and Physical   WANNA GULLY OIN:[id number] DOB: [birth year] DOA: 06/12/2020  Referring MD/NP/PA: Dr. Wilkie Aye, EDP PCP: Farris Has, MD  Patient coming from: Home by way of [facility]  Chief Complaint: Fall  HPI: Carolyn [name] is a 65 y.o. female with a history of alcoholism with recent relapse, alcoholic cirrhosis, chronic pancreatitis on chronic fentanyl patch who presented to the ED this morning after tripping at home and falling onto her face. She tripped over a heater, recalling the entire episode, fell forward not losing consciousness. She had facial bruising and pain on face. She has been drinking more than before, reporting 3-4 glasses of wine, denying any additional medications/substances, though she applies fentanyl patches as prescribed by pain management and hasn't changed that.  ED Course: Tachycardic, afebrile, appearing severely dehydrated with AKI, abnormal LFTs, normal lipase, and AGMA. CT cervical spine, head, maxillofacial demonstrated acute comminuted and displaced bilateral nasal bone fracture. XR of left hip, pelvis and chest were negative. Alcohol level was 151. IV fluids were given and admission requested.  Review of Systems: +Diffuse headache that is mild. No pain with EOM movement, no blurry or double vision. +N/V last night, none today. Denies fever, chills, weight loss, cough, sore throat, chest pain, palpitations, shortness of breath, abdominal pain, changes in bowel habits, blood in stool, change in bladder habits, myalgias, arthralgias, and per HPI. All others reviewed and are negative.   Past Medical History:  Diagnosis Date  . Alcoholic cirrhosis (HCC)   . Chronic pancreatitis (HCC)   . Endometriosis   . GERD (gastroesophageal reflux disease)   . Liver disease   . Malabsorption    MALABSORPTION SYNDROME  . Osteoporosis 03/2011   t score -2.5  . Pancreatitis    due to cyst and tumors due to calcifications  . Seizure (HCC)    no seizure disorder  .  Vitamin D deficiency 2012   VIT D 15   Past Surgical History:  Procedure Laterality Date  . APPENDECTOMY    . CHOLECYSTECTOMY    . FEEDING TUBE RELOCATION  2010  . JEJUNOSTOMY FEEDING TUBE  1990  . MULTIPLE GASTRIC SURGERIES    . MYOMECTOMY    . OPEN REDUCTION INTERNAL FIXATION (ORIF) DISTAL RADIAL FRACTURE Left 06/29/2018   Procedure: OPEN REDUCTION INTERNAL FIXATION (ORIF)LEFT  DISTAL RADIAL FRACTURE;  Surgeon: Betha Loa, MD;  Location: [facility];  Service: Orthopedics;  Laterality: Left;  . ROUX-EN-Y GASTRIC BYPASS    . TUBAL LIGATION     - Nonsmoker, +EtOH as above, lives with husband, no illicit substances.    reports that she has never smoked. She has never used smokeless tobacco. She reports previous alcohol use. She reports that she does not use drugs. Allergies  Allergen Reactions  . Peanut-Containing Drug Products Anaphylaxis    Tree nuts   Family History  Problem Relation Age of Onset  . Cancer Father        BONE  . Breast cancer Maternal Grandmother    - Family history otherwise reviewed and not pertinent.  Prior to Admission medications   Medication Sig Start Date End Date Taking? Authorizing Provider  calcium carbonate (OS-CAL - DOSED IN MG OF ELEMENTAL CALCIUM) 1250 MG tablet Take 1 tablet by mouth daily.    [provider]  famotidine (PEPCID) 10 MG tablet Take 10 mg by mouth 2 (two) times daily.      [provider]  fentaNYL (DURAGESIC - DOSED MCG/HR) 100 MCG/HR Place 1  patch onto the skin every other day.     [provider]  HYDROmorphone (DILAUDID) 2 MG tablet Take 4 mg by mouth 2 (two) times daily.     [provider]  ibuprofen (ADVIL,MOTRIN) 200 MG tablet Take 600 mg by mouth every 6 (six) hours as needed.    [provider]  lipase/protease/amylase (CREON) 12000 units CPEP capsule Take 2 capsules (24,000 Units total) by mouth 3 (three) times daily with meals. 08/24/18   Tressia DanasBeavers, Kimberly, MD   Multiple Vitamin (MULITIVITAMIN WITH MINERALS) TABS Take 1 tablet by mouth daily.    [provider]  ondansetron (ZOFRAN) 4 MG tablet Take 4 mg by mouth every 8 (eight) hours as needed.    [provider]  oxyCODONE-acetaminophen (PERCOCET) 5-325 MG tablet 1-2 tabs PO q6 hours prn pain 06/29/18   Betha LoaKuzma, Kevin, MD  traZODone (DESYREL) 50 MG tablet Take 50 mg by mouth at bedtime.      [provider]  PROMETHAZINE HCL PO Take 4 mg by mouth.   06/06/11  [provider]  Ranitidine HCl (ZANTAC PO) Take by mouth.    06/06/11  [provider]    Physical Exam: Vitals:   06/12/20 1430 06/12/20 1500 06/12/20 1525 06/12/20 1626  BP: 138/76 135/85 133/86 135/80  Pulse: (!) 102   (!) 104  Resp: 16 13 13 20   Temp:    98 F (36.7 C)  TempSrc:    Oral  SpO2: 94%   96%  Weight:      Height:       Constitutional: Frail female in no distress, calm demeanor appearing older than stated age. Eyes: Conjunctivae normal, PERRL, track smoothly with full EOM.   ENMT: Mucous membranes are tacky. Posterior pharynx clear of any exudate or lesions. Nares with R > L dried blood. Edentulous.  Neck: normal, supple, no masses, no thyromegaly Respiratory: Non-labored breathing without accessory muscle use. Clear breath sounds to auscultation bilaterally Cardiovascular: Regular rate and rhythm, no murmurs, rubs, or gallops. No carotid bruits. No JVD. No LE edema. Palpable pedal pulses. Abdomen: Normoactive bowel sounds. No tenderness, non-distended, and no masses palpated. No hepatosplenomegaly. GU: No indwelling catheter Musculoskeletal: No clubbing / cyanosis. No joint deformity upper and lower extremities. Good ROM, no contractures. Normal muscle tone.  Skin: Bilateral infraorbital ecchymoses without open wound. Neurologic: CN II-XII grossly intact. Gait not assessed. Speech normal. No focal deficits in motor strength or sensation in all extremities. Psychiatric: Alert  and oriented x3. Normal judgment and insight. Mood depressed with congruent affect.   Labs on Admission: I have personally reviewed following labs and imaging studies  CBC: Recent Labs  Lab 06/12/20 0330  WBC 9.9  NEUTROABS 8.5*  HGB 12.7  HCT 38.0  MCV 94.8  PLT 170   Basic Metabolic Panel: Recent Labs  Lab 06/12/20 0330  NA 141  K 4.7  CL 94*  CO2 20*  GLUCOSE 117*  BUN 23  CREATININE 1.05*  CALCIUM 8.5*  MG 2.0  PHOS 5.0*   GFR: Estimated Creatinine Clearance: 39.3 mL/min (A) (by C-G formula based on SCr of 1.05 mg/dL (H)). Liver Function Tests: Recent Labs  Lab 06/12/20 0330  AST 252*  ALT 75*  ALKPHOS 135*  BILITOT 1.6*  PROT 7.1  ALBUMIN 3.8   Recent Labs  Lab 06/12/20 0330  LIPASE 20   No results for input(s): AMMONIA in the last 168 hours. Coagulation Profile: No results for input(s): INR, PROTIME in the last  168 hours. Cardiac Enzymes: No results for input(s): CKTOTAL, CKMB, CKMBINDEX, TROPONINI in the last 168 hours. BNP (last 3 results) No results for input(s): PROBNP in the last 8760 hours. HbA1C: No results for input(s): HGBA1C in the last 72 hours. CBG: No results for input(s): GLUCAP in the last 168 hours. Lipid Profile: No results for input(s): CHOL, HDL, LDLCALC, TRIG, CHOLHDL, LDLDIRECT in the last 72 hours. Thyroid Function Tests: No results for input(s): TSH, T4TOTAL, FREET4, T3FREE, THYROIDAB in the last 72 hours. Anemia Panel: No results for input(s): VITAMINB12, FOLATE, FERRITIN, TIBC, IRON, RETICCTPCT in the last 72 hours. Urine analysis:    Component Value Date/Time   COLORURINE YELLOW 09/30/2017 1449   APPEARANCEUR CLEAR 09/30/2017 1449   LABSPEC <1.005 (L) 09/30/2017 1449   PHURINE 6.5 09/30/2017 1449   GLUCOSEU NEGATIVE 09/30/2017 1449   HGBUR NEGATIVE 09/30/2017 1449   BILIRUBINUR NEGATIVE 09/30/2017 1449   KETONESUR NEGATIVE 09/30/2017 1449   PROTEINUR NEGATIVE 09/30/2017 1449   UROBILINOGEN 4.0 (H) 06/07/2011  0007   NITRITE NEGATIVE 09/30/2017 1449   LEUKOCYTESUR TRACE (A) 09/30/2017 1449    Recent Results (from the past 240 hour(s))  Resp Panel by RT-PCR (Flu A&B, Covid) Nasopharyngeal Swab     Status: None   Collection Time: 06/12/20  5:49 AM   Specimen: Nasopharyngeal Swab; Nasopharyngeal(NP) swabs in vial transport medium  Result Value Ref Range Status   SARS Coronavirus 2 by RT PCR NEGATIVE NEGATIVE Final    Comment: (NOTE) SARS-CoV-2 target nucleic acids are NOT DETECTED.  The SARS-CoV-2 RNA is generally detectable in upper respiratory specimens during the acute phase of infection. The lowest concentration of SARS-CoV-2 viral copies this assay can detect is 138 copies/mL. A negative result does not preclude SARS-Cov-2 infection and should not be used as the sole basis for treatment or other patient management decisions. A negative result may occur with  improper specimen collection/handling, submission of specimen other than nasopharyngeal swab, presence of viral mutation(s) within the areas targeted by this assay, and inadequate number of viral copies(<138 copies/mL). A negative result must be combined with clinical observations, patient history, and epidemiological information. The expected result is Negative.  Fact Sheet for Patients:  BloggerCourse.com  Fact Sheet for Healthcare Providers:  SeriousBroker.it  This test is no t yet approved or cleared by the [place] FDA and  has been authorized for detection and/or diagnosis of SARS-CoV-2 by FDA under an Emergency Use Authorization (EUA). This EUA will remain  in effect (meaning this test can be used) for the duration of the COVID-19 declaration under Section 564(b)(1) of the Act, 21 U.S.C.section 360bbb-3(b)(1), unless the authorization is terminated  or revoked sooner.       Influenza A by PCR NEGATIVE NEGATIVE Final   Influenza B by PCR NEGATIVE NEGATIVE Final     Comment: (NOTE) The Xpert Xpress SARS-CoV-2/FLU/RSV plus assay is intended as an aid in the diagnosis of influenza from Nasopharyngeal swab specimens and should not be used as a sole basis for treatment. Nasal washings and aspirates are unacceptable for Xpert Xpress SARS-CoV-2/FLU/RSV testing.  Fact Sheet for Patients: BloggerCourse.com  Fact Sheet for Healthcare Providers: SeriousBroker.it  This test is not yet approved or cleared by the [place] FDA and has been authorized for detection and/or diagnosis of SARS-CoV-2 by FDA under an Emergency Use Authorization (EUA). This EUA will remain in effect (meaning this test can be used) for the duration of the COVID-19 declaration under Section 564(b)(1) of the Act, 21 U.S.C.  section 360bbb-3(b)(1), unless the authorization is terminated or revoked.  Performed at [facility], [address] [place], [place] [address]      Radiological Exams on Admission: DG Chest 2 View  Result Date: 06/12/2020 CLINICAL DATA:  Fall EXAM: CHEST - 2 VIEW COMPARISON:  None. FINDINGS: The heart size and mediastinal contours are within normal limits. Both lungs are clear. The visualized skeletal structures are unremarkable. IMPRESSION: No active cardiopulmonary disease. Electronically Signed   By: Deatra Robinson M.D.   On: 06/12/2020 03:14   CT Head Wo Contrast  Addendum Date: 06/12/2020   ADDENDUM REPORT: 06/12/2020 03:59 ADDENDUM: These results were called by telephone at the time of interpretation on 06/12/2020 at 3:59 am to provider COURTNEY HORTON , who verbally acknowledged these results. Electronically Signed   By: Tish Frederickson M.D.   On: 06/12/2020 03:59   Result Date: 06/12/2020 CLINICAL DATA:  Mechanical fall yesterday. Nausea and vomiting. History of seizure. EXAM: CT HEAD WITHOUT CONTRAST CT MAXILLOFACIAL WITHOUT CONTRAST CT CERVICAL SPINE WITHOUT CONTRAST TECHNIQUE:  Multidetector CT imaging of the head, cervical spine, and maxillofacial structures were performed using the standard protocol without intravenous contrast. Multiplanar CT image reconstructions of the cervical spine and maxillofacial structures were also generated. COMPARISON:  X-ray cervical spine 06/23/1999 report without imaging FINDINGS: CT HEAD FINDINGS Brain: Patchy and confluent areas of decreased attenuation are noted throughout the deep and periventricular white matter of the cerebral hemispheres bilaterally, compatible with chronic microvascular ischemic disease. No evidence of large-territorial acute infarction. No parenchymal hemorrhage. No mass lesion. No extra-axial collection. No mass effect or midline shift. No hydrocephalus. Basilar cisterns are patent. Vascular: No hyperdense vessel. Skull: No acute fracture or focal lesion. Other: None. CT MAXILLOFACIAL FINDINGS Osseous: Acute comminuted bilateral, right greater than left, nasal bone fracture. Medially displaced right nasal bone fracture. Sinuses/Orbits: Mucosal thickening of the left sphenoid sinus. Otherwise the remaining paranasal sinuses and mastoid air cells are clear. The orbits are unremarkable. Soft tissues: Subcutaneus soft tissue edema of the nose and maxilla. CT CERVICAL SPINE FINDINGS Alignment: Normal. Skull base and vertebrae: No acute fracture. No aggressive appearing focal osseous lesion or focal pathologic process. Soft tissues and spinal canal: No prevertebral fluid or swelling. No visible canal hematoma. Upper chest: Biapical pleural/pulmonary scarring. Calcified 2 mm left apical nodule. Other: Trace debris within the trachea. IMPRESSION: 1. No acute intracranial abnormality. 2. Acute comminuted and displaced bilateral nasal bone fracture. 3. No acute displaced fracture or traumatic listhesis of the cervical spine. 4. Trace debris within the trachea. Electronically Signed: By: Tish Frederickson M.D. On: 06/12/2020 03:38   CT  Cervical Spine Wo Contrast  Addendum Date: 06/12/2020   ADDENDUM REPORT: 06/12/2020 03:59 ADDENDUM: These results were called by telephone at the time of interpretation on 06/12/2020 at 3:59 am to provider COURTNEY HORTON , who verbally acknowledged these results. Electronically Signed   By: Tish Frederickson M.D.   On: 06/12/2020 03:59   Result Date: 06/12/2020 CLINICAL DATA:  Mechanical fall yesterday. Nausea and vomiting. History of seizure. EXAM: CT HEAD WITHOUT CONTRAST CT MAXILLOFACIAL WITHOUT CONTRAST CT CERVICAL SPINE WITHOUT CONTRAST TECHNIQUE: Multidetector CT imaging of the head, cervical spine, and maxillofacial structures were performed using the standard protocol without intravenous contrast. Multiplanar CT image reconstructions of the cervical spine and maxillofacial structures were also generated. COMPARISON:  X-ray cervical spine 06/23/1999 report without imaging FINDINGS: CT HEAD FINDINGS Brain: Patchy and confluent areas of decreased attenuation are noted throughout the deep  and periventricular white matter of the cerebral hemispheres bilaterally, compatible with chronic microvascular ischemic disease. No evidence of large-territorial acute infarction. No parenchymal hemorrhage. No mass lesion. No extra-axial collection. No mass effect or midline shift. No hydrocephalus. Basilar cisterns are patent. Vascular: No hyperdense vessel. Skull: No acute fracture or focal lesion. Other: None. CT MAXILLOFACIAL FINDINGS Osseous: Acute comminuted bilateral, right greater than left, nasal bone fracture. Medially displaced right nasal bone fracture. Sinuses/Orbits: Mucosal thickening of the left sphenoid sinus. Otherwise the remaining paranasal sinuses and mastoid air cells are clear. The orbits are unremarkable. Soft tissues: Subcutaneus soft tissue edema of the nose and maxilla. CT CERVICAL SPINE FINDINGS Alignment: Normal. Skull base and vertebrae: No acute fracture. No aggressive appearing focal osseous  lesion or focal pathologic process. Soft tissues and spinal canal: No prevertebral fluid or swelling. No visible canal hematoma. Upper chest: Biapical pleural/pulmonary scarring. Calcified 2 mm left apical nodule. Other: Trace debris within the trachea. IMPRESSION: 1. No acute intracranial abnormality. 2. Acute comminuted and displaced bilateral nasal bone fracture. 3. No acute displaced fracture or traumatic listhesis of the cervical spine. 4. Trace debris within the trachea. Electronically Signed: By: Tish Frederickson M.D. On: 06/12/2020 03:38   DG Hip Unilat W or Wo Pelvis 2-3 Views Left  Result Date: 06/12/2020 CLINICAL DATA:  Fall EXAM: DG HIP (WITH OR WITHOUT PELVIS) 2-3V LEFT COMPARISON:  None. FINDINGS: There is no evidence of hip fracture or dislocation. There is no evidence of arthropathy or other focal bone abnormality. IMPRESSION: Negative. Electronically Signed   By: Deatra Robinson M.D.   On: 06/12/2020 03:15   CT Maxillofacial Wo Contrast  Addendum Date: 06/12/2020   ADDENDUM REPORT: 06/12/2020 03:59 ADDENDUM: These results were called by telephone at the time of interpretation on 06/12/2020 at 3:59 am to provider COURTNEY HORTON , who verbally acknowledged these results. Electronically Signed   By: Tish Frederickson M.D.   On: 06/12/2020 03:59   Result Date: 06/12/2020 CLINICAL DATA:  Mechanical fall yesterday. Nausea and vomiting. History of seizure. EXAM: CT HEAD WITHOUT CONTRAST CT MAXILLOFACIAL WITHOUT CONTRAST CT CERVICAL SPINE WITHOUT CONTRAST TECHNIQUE: Multidetector CT imaging of the head, cervical spine, and maxillofacial structures were performed using the standard protocol without intravenous contrast. Multiplanar CT image reconstructions of the cervical spine and maxillofacial structures were also generated. COMPARISON:  X-ray cervical spine 06/23/1999 report without imaging FINDINGS: CT HEAD FINDINGS Brain: Patchy and confluent areas of decreased attenuation are noted throughout the  deep and periventricular white matter of the cerebral hemispheres bilaterally, compatible with chronic microvascular ischemic disease. No evidence of large-territorial acute infarction. No parenchymal hemorrhage. No mass lesion. No extra-axial collection. No mass effect or midline shift. No hydrocephalus. Basilar cisterns are patent. Vascular: No hyperdense vessel. Skull: No acute fracture or focal lesion. Other: None. CT MAXILLOFACIAL FINDINGS Osseous: Acute comminuted bilateral, right greater than left, nasal bone fracture. Medially displaced right nasal bone fracture. Sinuses/Orbits: Mucosal thickening of the left sphenoid sinus. Otherwise the remaining paranasal sinuses and mastoid air cells are clear. The orbits are unremarkable. Soft tissues: Subcutaneus soft tissue edema of the nose and maxilla. CT CERVICAL SPINE FINDINGS Alignment: Normal. Skull base and vertebrae: No acute fracture. No aggressive appearing focal osseous lesion or focal pathologic process. Soft tissues and spinal canal: No prevertebral fluid or swelling. No visible canal hematoma. Upper chest: Biapical pleural/pulmonary scarring. Calcified 2 mm left apical nodule. Other: Trace debris within the trachea. IMPRESSION: 1. No acute intracranial abnormality. 2. Acute comminuted and displaced  bilateral nasal bone fracture. 3. No acute displaced fracture or traumatic listhesis of the cervical spine. 4. Trace debris within the trachea. Electronically Signed: By: Tish Frederickson M.D. On: 06/12/2020 03:38   EKG: Independently reviewed. Sinus tachycardia, vent rate with 118bpm. Accounting for wander, no ST segment changes.  Assessment/Plan Active Problems:   Metabolic acidosis, increased anion gap   Alcohol abuse, intoxication, risk for withdrawal: EtOH positive in ED, last drink ~3/30 PM, reports 3-4 glasses wine/night.  - VSS. Will proceed with med-surg CIWA protocol.  - Cessation counseling to continue. Pt involved in AA, needs to get  sponsor.  - Thiamine, folate, MVM - CSW consulted  Alcoholic hepatitis and cirrhosis:  - Does not qualify for prednisolone at this time.  - Check coags, trend LFTs  Anion gap metabolic acidosis: Suspect alcoholic ketosis.   - Check CMP now s/p 3L NS. Tachycardia has resolved.  Fall at home, nasal fracture: Pt without amnesia or LOC. Facial trauma is due to intoxication more likely than true syncope. ECG with sinus tachycardia, no dysrhythmias. No visual impairments. Tetanus updated. - Pain control - Discussed with ENT, Dr. Julien Girt by phone who reviewed the case and personally reviewed the CT scan commenting on comminuted nasal fracture that is minimally displaced and currently needs no operative management or face-to-face encounter with ENT. He recommended follow up in his office in 2 weeks where they can discuss management. Info added to discharge section of EMR.  Chronic pancreatitis, chronic pain: Under care of pain management in [facility], reports fentanyl patch q2 days. Lipase wnl.  - I confirmed fentanyl patch prescribed regularly for q2day sig. by Reuben Likes Tall last 05/28/2020 on PDMP. To avoid withdrawal, will restart this and will give oxycodone and toradol prn.  - Creon, can advance as tolerated, but begin with liquid diet, antiemetics.   DVT prophylaxis: Lovenox  Code Status: Full  Family Communication: None at bedside Disposition Plan: Anticipate return home, PT ordered to evaluate tomorrow Consults called: ENT, Dr. Julien Girt for phone consult  Admission status: Observation    Tyrone Nine, MD [facility] [email or web address] 06/12/2020, 4:35 PM

## 2020-06-12 NOTE — ED Notes (Signed)
Given graham crackers and ensure

## 2020-06-12 NOTE — ED Triage Notes (Signed)
Pt presents with pain to face, neck and L hip s/p mechanical fall yesterday morning around 0600. Denies any LOC. Started having n/v tonight.

## 2020-06-12 NOTE — ED Notes (Signed)
Charge RN - Sophie notified pt trying to get out of bed Looking for cables for bed alarm  Molly with RT @ BS Pt given another water r/t spilling first water on the floor Pt does not have her upper teeth - at home and was offered a protein drink and received one

## 2020-06-12 NOTE — ED Notes (Signed)
Patient is resting comfortably. 

## 2020-06-12 NOTE — ED Notes (Signed)
Spoke with spouse - [phone] - Allyson Sabal of room # [phone] and [phone]

## 2020-06-12 NOTE — Progress Notes (Signed)
Falls risk band applied to pt right wrist per RN request.

## 2020-06-12 NOTE — ED Notes (Signed)
Entered pt room - introduced myself - reviewed admission process Pt holding emesis bag with ~150 ml dark liquid Pt on monitor - VS stable - uses NRB mask PRN.  disconnected completed IV fluids - applied pulse Ox to pt  Applied socks, another warn blanket around pt shoulders, packed belongings. Pt stated that she fell and tripped over her dog yesterday

## 2020-06-12 NOTE — ED Notes (Signed)
Spouse Don @ BS - advised of the ED process - phone # given  I will call Spouse when acceptance/transfer is known Spouse signed the transfer consent - pt was sleeping

## 2020-06-12 NOTE — ED Notes (Signed)
Report given to [employer] RN @ [phone]

## 2020-06-12 NOTE — ED Notes (Signed)
Entered pt room to get a CIWA score and found pt at the end of the bed trying to get up -"to go pee" Pt assisted back in the bed and purewick applied and explained  Pt given water per request

## 2020-06-13 ENCOUNTER — Observation Stay (HOSPITAL_COMMUNITY): Payer: Medicare PPO

## 2020-06-13 DIAGNOSIS — N179 Acute kidney failure, unspecified: Secondary | ICD-10-CM | POA: Diagnosis present

## 2020-06-13 DIAGNOSIS — R112 Nausea with vomiting, unspecified: Secondary | ICD-10-CM | POA: Diagnosis present

## 2020-06-13 DIAGNOSIS — E860 Dehydration: Secondary | ICD-10-CM | POA: Diagnosis present

## 2020-06-13 DIAGNOSIS — K7010 Alcoholic hepatitis without ascites: Secondary | ICD-10-CM | POA: Diagnosis present

## 2020-06-13 DIAGNOSIS — E43 Unspecified severe protein-calorie malnutrition: Secondary | ICD-10-CM | POA: Diagnosis present

## 2020-06-13 DIAGNOSIS — Y92009 Unspecified place in unspecified non-institutional (private) residence as the place of occurrence of the external cause: Secondary | ICD-10-CM | POA: Diagnosis not present

## 2020-06-13 DIAGNOSIS — Z681 Body mass index (BMI) 19 or less, adult: Secondary | ICD-10-CM | POA: Diagnosis not present

## 2020-06-13 DIAGNOSIS — K860 Alcohol-induced chronic pancreatitis: Secondary | ICD-10-CM | POA: Diagnosis not present

## 2020-06-13 DIAGNOSIS — E872 Acidosis: Secondary | ICD-10-CM | POA: Diagnosis present

## 2020-06-13 DIAGNOSIS — D696 Thrombocytopenia, unspecified: Secondary | ICD-10-CM | POA: Diagnosis present

## 2020-06-13 DIAGNOSIS — E8889 Other specified metabolic disorders: Secondary | ICD-10-CM | POA: Diagnosis present

## 2020-06-13 DIAGNOSIS — S022XXA Fracture of nasal bones, initial encounter for closed fracture: Secondary | ICD-10-CM | POA: Diagnosis present

## 2020-06-13 DIAGNOSIS — G8929 Other chronic pain: Secondary | ICD-10-CM | POA: Diagnosis present

## 2020-06-13 DIAGNOSIS — Y906 Blood alcohol level of 120-199 mg/100 ml: Secondary | ICD-10-CM | POA: Diagnosis present

## 2020-06-13 DIAGNOSIS — D638 Anemia in other chronic diseases classified elsewhere: Secondary | ICD-10-CM | POA: Diagnosis present

## 2020-06-13 DIAGNOSIS — Z20822 Contact with and (suspected) exposure to covid-19: Secondary | ICD-10-CM | POA: Diagnosis present

## 2020-06-13 DIAGNOSIS — S0083XA Contusion of other part of head, initial encounter: Secondary | ICD-10-CM | POA: Diagnosis present

## 2020-06-13 DIAGNOSIS — M810 Age-related osteoporosis without current pathological fracture: Secondary | ICD-10-CM | POA: Diagnosis present

## 2020-06-13 DIAGNOSIS — F10229 Alcohol dependence with intoxication, unspecified: Secondary | ICD-10-CM | POA: Diagnosis present

## 2020-06-13 DIAGNOSIS — K219 Gastro-esophageal reflux disease without esophagitis: Secondary | ICD-10-CM | POA: Diagnosis present

## 2020-06-13 DIAGNOSIS — R54 Age-related physical debility: Secondary | ICD-10-CM | POA: Diagnosis present

## 2020-06-13 DIAGNOSIS — Z803 Family history of malignant neoplasm of breast: Secondary | ICD-10-CM | POA: Diagnosis not present

## 2020-06-13 DIAGNOSIS — W010XXA Fall on same level from slipping, tripping and stumbling without subsequent striking against object, initial encounter: Secondary | ICD-10-CM | POA: Diagnosis present

## 2020-06-13 DIAGNOSIS — K7030 Alcoholic cirrhosis of liver without ascites: Secondary | ICD-10-CM | POA: Diagnosis present

## 2020-06-13 DIAGNOSIS — E876 Hypokalemia: Secondary | ICD-10-CM | POA: Diagnosis present

## 2020-06-13 DIAGNOSIS — Z23 Encounter for immunization: Secondary | ICD-10-CM | POA: Diagnosis not present

## 2020-06-13 DIAGNOSIS — Z79891 Long term (current) use of opiate analgesic: Secondary | ICD-10-CM | POA: Diagnosis not present

## 2020-06-13 DIAGNOSIS — K861 Other chronic pancreatitis: Secondary | ICD-10-CM | POA: Diagnosis present

## 2020-06-13 HISTORY — DX: Nausea with vomiting, unspecified: R11.2

## 2020-06-13 LAB — CBC
HCT: 27.7 % — ABNORMAL LOW (ref 36.0–46.0)
Hemoglobin: 9.4 g/dL — ABNORMAL LOW (ref 12.0–15.0)
MCH: 31.8 pg (ref 26.0–34.0)
MCHC: 33.9 g/dL (ref 30.0–36.0)
MCV: 93.6 fL (ref 80.0–100.0)
Platelets: 66 10*3/uL — ABNORMAL LOW (ref 150–400)
RBC: 2.96 MIL/uL — ABNORMAL LOW (ref 3.87–5.11)
RDW: 12 % (ref 11.5–15.5)
WBC: 4.7 10*3/uL (ref 4.0–10.5)
nRBC: 0 % (ref 0.0–0.2)

## 2020-06-13 LAB — HEMOGLOBIN AND HEMATOCRIT, BLOOD
HCT: 27.4 % — ABNORMAL LOW (ref 36.0–46.0)
Hemoglobin: 9.5 g/dL — ABNORMAL LOW (ref 12.0–15.0)

## 2020-06-13 LAB — COMPREHENSIVE METABOLIC PANEL
ALT: 57 U/L — ABNORMAL HIGH (ref 0–44)
AST: 161 U/L — ABNORMAL HIGH (ref 15–41)
Albumin: 2.8 g/dL — ABNORMAL LOW (ref 3.5–5.0)
Alkaline Phosphatase: 122 U/L (ref 38–126)
Anion gap: 12 (ref 5–15)
BUN: 13 mg/dL (ref 8–23)
CO2: 23 mmol/L (ref 22–32)
Calcium: 8 mg/dL — ABNORMAL LOW (ref 8.9–10.3)
Chloride: 99 mmol/L (ref 98–111)
Creatinine, Ser: 0.77 mg/dL (ref 0.44–1.00)
GFR, Estimated: >60 mL/min (ref >60)
Glucose, Bld: 199 mg/dL — ABNORMAL HIGH (ref 70–99)
Potassium: 3.4 mmol/L — ABNORMAL LOW (ref 3.5–5.1)
Sodium: 134 mmol/L — ABNORMAL LOW (ref 135–145)
Total Bilirubin: 1 mg/dL (ref 0.3–1.2)
Total Protein: 5.3 g/dL — ABNORMAL LOW (ref 6.5–8.1)

## 2020-06-13 MED ORDER — POTASSIUM CHLORIDE CRYS ER 20 MEQ PO TBCR
20.0000 meq | EXTENDED_RELEASE_TABLET | Freq: Once | ORAL | Status: AC
  Administered 2020-06-13: 20 meq via ORAL
  Filled 2020-06-13: qty 1

## 2020-06-13 MED ORDER — FENTANYL 100 MCG/HR TD PT72
1.0000 | MEDICATED_PATCH | TRANSDERMAL | Status: DC
  Administered 2020-06-13: 1 via TRANSDERMAL

## 2020-06-13 MED ORDER — SODIUM CHLORIDE 0.9 % IV SOLN: Freq: Once | INTRAVENOUS | Status: AC

## 2020-06-13 NOTE — TOC Transition Note (Signed)
Transition of Care [facility]) - CM/SW Discharge Note   Patient Details  Name: Carolyn [name] MRN: [id number] Date of Birth: [birth year]  [facility]) CM/SW Contact:  Darleene Cleaver, LCSW Phone Number: 06/13/2020, 10:45 AM   Clinical Narrative:     CSW received consult for patient need substance abuse resources.  CSW put substance abuse resources on AVS.  CSW signing off please reconsult if social work needs arise.   Final next level of care: Home/Self Care Barriers to Discharge: Barriers Resolved   Patient Goals and CMS Choice Patient states their goals for this hospitalization and ongoing recovery are:: To return back home. CMS Medicare.gov Compare Post Acute Care list provided to:: Patient Choice offered to / list presented to : Patient  Discharge Placement  Patient discharging back home.                     Discharge Plan and Services                                     Social Determinants of Health (SDOH) Interventions     Readmission Risk Interventions No flowsheet data found.

## 2020-06-13 NOTE — Progress Notes (Signed)
Inpatient Diabetes Program Recommendations  AACE/ADA: New Consensus Statement on Inpatient Glycemic Control (2015)  Target Ranges:  Prepandial:   less than 140 mg/dL      Peak postprandial:   less than 180 mg/dL (1-2 hours)      Critically ill patients:  140 - 180 mg/dL   No results found for: GLUCAP, HGBA1C  Review of Glycemic Control Results for Carolyn [name], Carolyn [name] (MRN [id number]) as of 06/13/2020 10:57  Ref. Range 06/12/2020 03:30 06/12/2020 17:06 06/13/2020 04:32  Glucose Latest Ref Range: 70 - 99 mg/dL 022 (H) 336 (H) 122 (H)  Chronic pancreatitis-No DM noted Outpatient Diabetes medications:  None Current orders for Inpatient glycemic control:  None Inpatient Diabetes Program Recommendations:   Note elevated glucose?  May want to check CBG's tid with meals and HS to assess blood sugars.   Thanks  Beryl Meager, RN, BC-ADM Inpatient Diabetes Coordinator Pager [phone] (8a-5p)

## 2020-06-13 NOTE — Progress Notes (Signed)
PROGRESS NOTE  Carolyn [name]  ZOX:[id number] DOB: Dec 27, 1955 DOA: 06/12/2020 PCP: Farris Has, MD  Brief Narrative: Carolyn [name] is a 65 y.o. female with a history of alcoholic cirrhosis, chronic pancreatitis, and alcoholism with recent relapse who tripped over a heater at home with resultant facial trauma. In the ED 3/31 she was tachycardic, afebrile, appearing severely dehydrated with AKI, abnormal LFTs, normal lipase, and AGMA. CT cervical spine, head, maxillofacial demonstrated acute comminuted and displaced bilateral nasal bone fracture. XR of left hip, pelvis and chest were negative. Alcohol level was 151. IV fluids were given and admission requested.  Assessment & Plan: Active Problems:   Chronic pancreatitis (HCC)   Metabolic acidosis, increased anion gap  Intractable nausea and vomiting:  - Continue IV fluids and antiemetics.  - Benign abdomen, no diarrhea, ?if due to alcohol withdrawal. Lipase is normal. Fentanyl patch was also off for some time and she's been on this for decades, so possibly related to opioid withdrawal as well. Continue fentanyl patch and CIWA  Hypokalemia:  - Supplement.  Cough: Suspect upper airway cough syndrome with continued normal exam and CXR on my personal review this afternoon.   Alcohol abuse, intoxication, risk for withdrawal: EtOH positive in ED, last drink ~3/30 PM, reports 3-4 glasses wine/night.  - Continue CIWA - CSW has provided resources. Pt involved in AA, needs to get sponsor.  - Thiamine, folate, MVM  Alcoholic hepatitis and cirrhosis with thrombocytopenia: LFTs improved. INR 1.1, TBili 1.0.  - Supportive care  Thrombocytopenia: Seemingly precipitous drop in platelets is actually just a return to her baseline. The abnormal value was 170 on presentation due to hemoconcentration.  - Continue monitoring for bleeding.   Anemia of chronic disease: Possibly also related to malnutrition.  - Repeat H/H to confirm stability - drop in hgb  likely hemodilutional nearer to baseline.  Anion gap metabolic acidosis: Suspect alcoholic ketosis which has resolved with >4L IV fluids.  Fall at home, nasal fracture: Pt without amnesia or LOC. Facial trauma is due to intoxication more likely than true syncope. ECG with sinus tachycardia, no dysrhythmias. No visual impairments. Tetanus updated. - Pain control - Discussed with ENT, Dr. Julien Girt by phone who reviewed the case and personally reviewed the CT scan commenting on comminuted nasal fracture that is minimally displaced and currently needs no operative management or face-to-face encounter with ENT. He recommended follow up in his office in 2 weeks where they can discuss management. Info added to discharge section of EMR.  Chronic pancreatitis, chronic pain: Under care of pain management in [facility], reports fentanyl patch q2 days. Lipase wnl.  - Reapply fentanyl patch. I confirmed fentanyl patch prescribed regularly for q2day sig. by Reuben Likes Tall last 05/28/2020 on PDMP.  - Creon, can advance as tolerated, but begin with liquid diet, antiemetics.   DVT prophylaxis: Lovenox Code Status: Full Family Communication: None at bedside during AM or PM rounds Disposition Plan:  Status is: Observation  The patient will require care spanning > 2 midnights and should be moved to inpatient because: IV treatments appropriate due to intensity of illness or inability to take PO  Dispo: The patient is from: Home              Anticipated d/c is to: Home              Patient currently is not medically stable to d/c.   Difficult to place patient No  Consultants:   None  Procedures:  None  Antimicrobials:  None   Subjective: Pain in neck and face are improved overall. Having some recurrence of nausea and some vomiting this morning. Was unable to eat any breakfast, has had 3 episodes of vomiting since that time despite IV zofran. Held down about 4oz ginger ale. Had  some cough as well without dyspnea or chest pain. No abd pain or diarrhea.  Objective: Vitals:   06/12/20 2357 06/13/20 0400 06/13/20 0422 06/13/20 1250  BP: 128/81 117/70 140/86 (!) 149/85  Pulse: 89 72 90 84  Resp: 16 16 14 20   Temp: 98.6 F (37 C) 98.7 F (37.1 C) 98.4 F (36.9 C) 98.7 F (37.1 C)  TempSrc: Oral Oral Oral Oral  SpO2: 94% 98% 100% 91%  Weight:      Height:        Intake/Output Summary (Last 24 hours) at 06/13/2020 1556 Last data filed at 06/13/2020 0816 Gross per 24 hour  Intake 303 ml  Output 600 ml  Net -297 ml   Filed Weights   06/12/20 0234  Weight: 45.4 kg    Gen: Thin frail female in no distress Pulm: Non-labored breathing. Clear to auscultation bilaterally.  CV: Regular rate and rhythm. No murmur, rub, or gallop. No JVD, no pedal edema. GI: Abdomen soft, non-tender, non-distended, with normoactive bowel sounds. No organomegaly or masses felt. Ext: Warm, no deformities Skin: Nasal bridge with superficial lacerations. Infraorbital ecchymoses are undergoing normal evolutionary changes.  Neuro: Alert and oriented. No focal neurological deficits. Psych: Judgement and insight appear normal. Mood & affect appropriate.   Data Reviewed: I have personally reviewed following labs and imaging studies  CBC: Recent Labs  Lab 06/12/20 0330 06/13/20 0432 06/13/20 1023  WBC 9.9 4.7  --   NEUTROABS 8.5*  --   --   HGB 12.7 9.4* 9.5*  HCT 38.0 27.7* 27.4*  MCV 94.8 93.6  --   PLT 170 66*  --    Basic Metabolic Panel: Recent Labs  Lab 06/12/20 0330 06/12/20 1706 06/13/20 0432  NA 141 138 134*  K 4.7 3.8 3.4*  CL 94* 99 99  CO2 20* 24 23  GLUCOSE 117* 351* 199*  BUN 23 19 13   CREATININE 1.05* 0.86 0.77  CALCIUM 8.5* 7.7* 8.0*  MG 2.0  --   --   PHOS 5.0*  --   --    GFR: Estimated Creatinine Clearance: 51.6 mL/min (by C-G formula based on SCr of 0.77 mg/dL). Liver Function Tests: Recent Labs  Lab 06/12/20 0330 06/12/20 1706 06/13/20 0432   AST 252* 293* 161*  ALT 75* 72* 57*  ALKPHOS 135* 145* 122  BILITOT 1.6* 1.5* 1.0  PROT 7.1 5.9* 5.3*  ALBUMIN 3.8 3.2* 2.8*   Recent Labs  Lab 06/12/20 0330  LIPASE 20   No results for input(s): AMMONIA in the last 168 hours. Coagulation Profile: Recent Labs  Lab 06/12/20 1706  INR 1.1   Cardiac Enzymes: No results for input(s): CKTOTAL, CKMB, CKMBINDEX, TROPONINI in the last 168 hours. BNP (last 3 results) No results for input(s): PROBNP in the last 8760 hours. HbA1C: No results for input(s): HGBA1C in the last 72 hours. CBG: No results for input(s): GLUCAP in the last 168 hours. Lipid Profile: No results for input(s): CHOL, HDL, LDLCALC, TRIG, CHOLHDL, LDLDIRECT in the last 72 hours. Thyroid Function Tests: No results for input(s): TSH, T4TOTAL, FREET4, T3FREE, THYROIDAB in the last 72 hours. Anemia Panel: No results for input(s): VITAMINB12, FOLATE, FERRITIN, TIBC, IRON, RETICCTPCT  in the last 72 hours. Urine analysis:    Component Value Date/Time   COLORURINE YELLOW 09/30/2017 1449   APPEARANCEUR CLEAR 09/30/2017 1449   LABSPEC <1.005 (L) 09/30/2017 1449   PHURINE 6.5 09/30/2017 1449   GLUCOSEU NEGATIVE 09/30/2017 1449   HGBUR NEGATIVE 09/30/2017 1449   BILIRUBINUR NEGATIVE 09/30/2017 1449   KETONESUR NEGATIVE 09/30/2017 1449   PROTEINUR NEGATIVE 09/30/2017 1449   UROBILINOGEN 4.0 (H) 06/07/2011 0007   NITRITE NEGATIVE 09/30/2017 1449   LEUKOCYTESUR TRACE (A) 09/30/2017 1449   Recent Results (from the past 240 hour(s))  Resp Panel by RT-PCR (Flu A&B, Covid) Nasopharyngeal Swab     Status: None   Collection Time: 06/12/20  5:49 AM   Specimen: Nasopharyngeal Swab; Nasopharyngeal(NP) swabs in vial transport medium  Result Value Ref Range Status   SARS Coronavirus 2 by RT PCR NEGATIVE NEGATIVE Final    Comment: (NOTE) SARS-CoV-2 target nucleic acids are NOT DETECTED.  The SARS-CoV-2 RNA is generally detectable in upper respiratory specimens during the  acute phase of infection. The lowest concentration of SARS-CoV-2 viral copies this assay can detect is 138 copies/mL. A negative result does not preclude SARS-Cov-2 infection and should not be used as the sole basis for treatment or other patient management decisions. A negative result may occur with  improper specimen collection/handling, submission of specimen other than nasopharyngeal swab, presence of viral mutation(s) within the areas targeted by this assay, and inadequate number of viral copies(<138 copies/mL). A negative result must be combined with clinical observations, patient history, and epidemiological information. The expected result is Negative.  Fact Sheet for Patients:  BloggerCourse.com  Fact Sheet for Healthcare Providers:  SeriousBroker.it  This test is no t yet approved or cleared by the [place] FDA and  has been authorized for detection and/or diagnosis of SARS-CoV-2 by FDA under an Emergency Use Authorization (EUA). This EUA will remain  in effect (meaning this test can be used) for the duration of the COVID-19 declaration under Section 564(b)(1) of the Act, 21 U.S.C.section 360bbb-3(b)(1), unless the authorization is terminated  or revoked sooner.       Influenza A by PCR NEGATIVE NEGATIVE Final   Influenza B by PCR NEGATIVE NEGATIVE Final    Comment: (NOTE) The Xpert Xpress SARS-CoV-2/FLU/RSV plus assay is intended as an aid in the diagnosis of influenza from Nasopharyngeal swab specimens and should not be used as a sole basis for treatment. Nasal washings and aspirates are unacceptable for Xpert Xpress SARS-CoV-2/FLU/RSV testing.  Fact Sheet for Patients: BloggerCourse.com  Fact Sheet for Healthcare Providers: SeriousBroker.it  This test is not yet approved or cleared by the [place] FDA and has been authorized for detection and/or  diagnosis of SARS-CoV-2 by FDA under an Emergency Use Authorization (EUA). This EUA will remain in effect (meaning this test can be used) for the duration of the COVID-19 declaration under Section 564(b)(1) of the Act, 21 U.S.C. section 360bbb-3(b)(1), unless the authorization is terminated or revoked.  Performed at [facility], [address] [place], [place] [address]       Radiology Studies: DG Chest 2 View  Result Date: 06/13/2020 CLINICAL DATA:  65 year old female with cough. EXAM: CHEST - 2 VIEW COMPARISON:  06/12/2020 FINDINGS: The mediastinal contours are within normal limits. No cardiomegaly. The lungs are clear bilaterally without evidence of focal consolidation, pleural effusion, or pneumothorax. No acute osseous abnormality. IMPRESSION: No acute cardiopulmonary process. Electronically Signed   By: Marliss Coots MD   On:  06/13/2020 14:47   DG Chest 2 View  Result Date: 06/12/2020 CLINICAL DATA:  Fall EXAM: CHEST - 2 VIEW COMPARISON:  None. FINDINGS: The heart size and mediastinal contours are within normal limits. Both lungs are clear. The visualized skeletal structures are unremarkable. IMPRESSION: No active cardiopulmonary disease. Electronically Signed   By: Deatra Robinson M.D.   On: 06/12/2020 03:14   CT Head Wo Contrast  Addendum Date: 06/12/2020   ADDENDUM REPORT: 06/12/2020 03:59 ADDENDUM: These results were called by telephone at the time of interpretation on 06/12/2020 at 3:59 am to provider COURTNEY HORTON , who verbally acknowledged these results. Electronically Signed   By: Tish Frederickson M.D.   On: 06/12/2020 03:59   Result Date: 06/12/2020 CLINICAL DATA:  Mechanical fall yesterday. Nausea and vomiting. History of seizure. EXAM: CT HEAD WITHOUT CONTRAST CT MAXILLOFACIAL WITHOUT CONTRAST CT CERVICAL SPINE WITHOUT CONTRAST TECHNIQUE: Multidetector CT imaging of the head, cervical spine, and maxillofacial structures were performed using the standard protocol  without intravenous contrast. Multiplanar CT image reconstructions of the cervical spine and maxillofacial structures were also generated. COMPARISON:  X-ray cervical spine 06/23/1999 report without imaging FINDINGS: CT HEAD FINDINGS Brain: Patchy and confluent areas of decreased attenuation are noted throughout the deep and periventricular white matter of the cerebral hemispheres bilaterally, compatible with chronic microvascular ischemic disease. No evidence of large-territorial acute infarction. No parenchymal hemorrhage. No mass lesion. No extra-axial collection. No mass effect or midline shift. No hydrocephalus. Basilar cisterns are patent. Vascular: No hyperdense vessel. Skull: No acute fracture or focal lesion. Other: None. CT MAXILLOFACIAL FINDINGS Osseous: Acute comminuted bilateral, right greater than left, nasal bone fracture. Medially displaced right nasal bone fracture. Sinuses/Orbits: Mucosal thickening of the left sphenoid sinus. Otherwise the remaining paranasal sinuses and mastoid air cells are clear. The orbits are unremarkable. Soft tissues: Subcutaneus soft tissue edema of the nose and maxilla. CT CERVICAL SPINE FINDINGS Alignment: Normal. Skull base and vertebrae: No acute fracture. No aggressive appearing focal osseous lesion or focal pathologic process. Soft tissues and spinal canal: No prevertebral fluid or swelling. No visible canal hematoma. Upper chest: Biapical pleural/pulmonary scarring. Calcified 2 mm left apical nodule. Other: Trace debris within the trachea. IMPRESSION: 1. No acute intracranial abnormality. 2. Acute comminuted and displaced bilateral nasal bone fracture. 3. No acute displaced fracture or traumatic listhesis of the cervical spine. 4. Trace debris within the trachea. Electronically Signed: By: Tish Frederickson M.D. On: 06/12/2020 03:38   CT Cervical Spine Wo Contrast  Addendum Date: 06/12/2020   ADDENDUM REPORT: 06/12/2020 03:59 ADDENDUM: These results were called by  telephone at the time of interpretation on 06/12/2020 at 3:59 am to provider COURTNEY HORTON , who verbally acknowledged these results. Electronically Signed   By: Tish Frederickson M.D.   On: 06/12/2020 03:59   Result Date: 06/12/2020 CLINICAL DATA:  Mechanical fall yesterday. Nausea and vomiting. History of seizure. EXAM: CT HEAD WITHOUT CONTRAST CT MAXILLOFACIAL WITHOUT CONTRAST CT CERVICAL SPINE WITHOUT CONTRAST TECHNIQUE: Multidetector CT imaging of the head, cervical spine, and maxillofacial structures were performed using the standard protocol without intravenous contrast. Multiplanar CT image reconstructions of the cervical spine and maxillofacial structures were also generated. COMPARISON:  X-ray cervical spine 06/23/1999 report without imaging FINDINGS: CT HEAD FINDINGS Brain: Patchy and confluent areas of decreased attenuation are noted throughout the deep and periventricular white matter of the cerebral hemispheres bilaterally, compatible with chronic microvascular ischemic disease. No evidence of large-territorial acute infarction. No parenchymal hemorrhage. No mass lesion. No extra-axial  collection. No mass effect or midline shift. No hydrocephalus. Basilar cisterns are patent. Vascular: No hyperdense vessel. Skull: No acute fracture or focal lesion. Other: None. CT MAXILLOFACIAL FINDINGS Osseous: Acute comminuted bilateral, right greater than left, nasal bone fracture. Medially displaced right nasal bone fracture. Sinuses/Orbits: Mucosal thickening of the left sphenoid sinus. Otherwise the remaining paranasal sinuses and mastoid air cells are clear. The orbits are unremarkable. Soft tissues: Subcutaneus soft tissue edema of the nose and maxilla. CT CERVICAL SPINE FINDINGS Alignment: Normal. Skull base and vertebrae: No acute fracture. No aggressive appearing focal osseous lesion or focal pathologic process. Soft tissues and spinal canal: No prevertebral fluid or swelling. No visible canal hematoma.  Upper chest: Biapical pleural/pulmonary scarring. Calcified 2 mm left apical nodule. Other: Trace debris within the trachea. IMPRESSION: 1. No acute intracranial abnormality. 2. Acute comminuted and displaced bilateral nasal bone fracture. 3. No acute displaced fracture or traumatic listhesis of the cervical spine. 4. Trace debris within the trachea. Electronically Signed: By: Tish FredericksonMorgane  Naveau M.D. On: 06/12/2020 03:38   DG Hip Unilat W or Wo Pelvis 2-3 Views Left  Result Date: 06/12/2020 CLINICAL DATA:  Fall EXAM: DG HIP (WITH OR WITHOUT PELVIS) 2-3V LEFT COMPARISON:  None. FINDINGS: There is no evidence of hip fracture or dislocation. There is no evidence of arthropathy or other focal bone abnormality. IMPRESSION: Negative. Electronically Signed   By: Deatra RobinsonKevin  Herman M.D.   On: 06/12/2020 03:15   CT Maxillofacial Wo Contrast  Addendum Date: 06/12/2020   ADDENDUM REPORT: 06/12/2020 03:59 ADDENDUM: These results were called by telephone at the time of interpretation on 06/12/2020 at 3:59 am to provider COURTNEY HORTON , who verbally acknowledged these results. Electronically Signed   By: Tish FredericksonMorgane  Naveau M.D.   On: 06/12/2020 03:59   Result Date: 06/12/2020 CLINICAL DATA:  Mechanical fall yesterday. Nausea and vomiting. History of seizure. EXAM: CT HEAD WITHOUT CONTRAST CT MAXILLOFACIAL WITHOUT CONTRAST CT CERVICAL SPINE WITHOUT CONTRAST TECHNIQUE: Multidetector CT imaging of the head, cervical spine, and maxillofacial structures were performed using the standard protocol without intravenous contrast. Multiplanar CT image reconstructions of the cervical spine and maxillofacial structures were also generated. COMPARISON:  X-ray cervical spine 06/23/1999 report without imaging FINDINGS: CT HEAD FINDINGS Brain: Patchy and confluent areas of decreased attenuation are noted throughout the deep and periventricular white matter of the cerebral hemispheres bilaterally, compatible with chronic microvascular ischemic  disease. No evidence of large-territorial acute infarction. No parenchymal hemorrhage. No mass lesion. No extra-axial collection. No mass effect or midline shift. No hydrocephalus. Basilar cisterns are patent. Vascular: No hyperdense vessel. Skull: No acute fracture or focal lesion. Other: None. CT MAXILLOFACIAL FINDINGS Osseous: Acute comminuted bilateral, right greater than left, nasal bone fracture. Medially displaced right nasal bone fracture. Sinuses/Orbits: Mucosal thickening of the left sphenoid sinus. Otherwise the remaining paranasal sinuses and mastoid air cells are clear. The orbits are unremarkable. Soft tissues: Subcutaneus soft tissue edema of the nose and maxilla. CT CERVICAL SPINE FINDINGS Alignment: Normal. Skull base and vertebrae: No acute fracture. No aggressive appearing focal osseous lesion or focal pathologic process. Soft tissues and spinal canal: No prevertebral fluid or swelling. No visible canal hematoma. Upper chest: Biapical pleural/pulmonary scarring. Calcified 2 mm left apical nodule. Other: Trace debris within the trachea. IMPRESSION: 1. No acute intracranial abnormality. 2. Acute comminuted and displaced bilateral nasal bone fracture. 3. No acute displaced fracture or traumatic listhesis of the cervical spine. 4. Trace debris within the trachea. Electronically Signed: By: Normajean GlasgowMorgane  Naveau M.D.  On: 06/12/2020 03:38    Scheduled Meds: . enoxaparin (LOVENOX) injection  40 mg Subcutaneous Q24H  . fentaNYL  1 patch Transdermal Q72H  . folic acid  1 mg Oral Daily  . lipase/protease/amylase  24,000 Units Oral TID WC  . multivitamin with minerals  1 tablet Oral Daily  . sodium chloride flush  3 mL Intravenous Q12H  . thiamine  100 mg Oral Daily   Continuous Infusions:   LOS: 0 days   Time spent: 25 minutes.  Tyrone Nine, MD [facility] [email or web address] 06/13/2020, 3:56 PM

## 2020-06-13 NOTE — Evaluation (Signed)
Physical Therapy Evaluation Patient Details Name: Carolyn [name] MRN: [id number] DOB: 1955/09/22 Today's Date: 06/13/2020   History of Present Illness  65 y.o. female with a history of alcoholism with recent relapse, alcoholic cirrhosis, chronic pancreatitis on chronic fentanyl patch who presented to the ED this morning after tripping at home and falling onto her face. She tripped over a heater, recalling the entire episode, fell forward not losing consciousness. She had facial bruising and pain on face. She has been drinking more than before, reporting 3-4 glasses of wine, denying any additional medications/substances, though she applies fentanyl patches as prescribed by pain management and hasn't changed that.  Clinical Impression  Pt admitted with above diagnosis.  Pt currently with functional limitations due to the deficits listed below (see PT Problem List). Pt will benefit from skilled PT to increase their independence and safety with mobility to allow discharge to the venue listed below.  Pt ambulated short distance in hallway.  Pt limited by nausea and fatigue.  Pt reports her husband can assist her with stairs at home.  Pt would benefit from RW and HHPT upon d/c.     Follow Up Recommendations Home health PT;Supervision for mobility/OOB    Equipment Recommendations  Rolling walker with 5" wheels    Recommendations for Other Services       Precautions / Restrictions Precautions Precautions: Fall      Mobility  Bed Mobility Overal bed mobility: Modified Independent             General bed mobility comments: slow due to nausea    Transfers Overall transfer level: Needs assistance Equipment used: Rolling walker (2 wheeled) Transfers: Sit to/from Stand Sit to Stand: Min guard         General transfer comment: verbal cues for hand placement  Ambulation/Gait Ambulation/Gait assistance: Min guard Gait Distance (Feet): 30 Feet Assistive device: Rolling walker (2  wheeled) Gait Pattern/deviations: Step-through pattern;Decreased stride length     General Gait Details: verbal cues for RW positioning, short steps, decreased distance due to nausea and fatigue  Stairs            Wheelchair Mobility    Modified Rankin (Stroke Patients Only)       Balance                                             Pertinent Vitals/Pain Pain Assessment: Faces Faces Pain Scale: Hurts little more Pain Location: "pancreatic" pain (baseline per pt) and facial pain Pain Descriptors / Indicators: Sore;Tender Pain Intervention(s): Repositioned;Monitored during session    Home Living Family/patient expects to be discharged to:: Private residence Living Arrangements: Spouse/significant other Available Help at Discharge: Family Type of Home: House Home Access: Stairs to enter Entrance Stairs-Rails: None Secretary/administrator of Steps: 3-4 Home Layout: One level Home Equipment: None      Prior Function Level of Independence: Independent               Hand Dominance        Extremity/Trunk Assessment        Lower Extremity Assessment Lower Extremity Assessment: Generalized weakness    Cervical / Trunk Assessment Cervical / Trunk Assessment: Normal  Communication   Communication: No difficulties  Cognition Arousal/Alertness: Awake/alert Behavior During Therapy: WFL for tasks assessed/performed Overall Cognitive Status: Within Functional Limits for tasks assessed  General Comments      Exercises     Assessment/Plan    PT Assessment Patient needs continued PT services  PT Problem List Decreased strength;Decreased mobility;Decreased activity tolerance;Decreased balance;Decreased knowledge of use of DME       PT Treatment Interventions Gait training;DME instruction;Therapeutic exercise;Balance training;Functional mobility training;Therapeutic  activities;Patient/family education    PT Goals (Current goals can be found in the Care Plan section)  Acute Rehab PT Goals PT Goal Formulation: With patient Time For Goal Achievement: 06/20/20 Potential to Achieve Goals: Good    Frequency Min 3X/week   Barriers to discharge        Co-evaluation               AM-PAC PT "6 Clicks" Mobility  Outcome Measure Help needed turning from your back to your side while in a flat bed without using bedrails?: A Little Help needed moving from lying on your back to sitting on the side of a flat bed without using bedrails?: A Little Help needed moving to and from a bed to a chair (including a wheelchair)?: A Little Help needed standing up from a chair using your arms (e.g., wheelchair or bedside chair)?: A Little Help needed to walk in hospital room?: A Little Help needed climbing 3-5 steps with a railing? : A Little 6 Click Score: 18    End of Session Equipment Utilized During Treatment: Gait belt Activity Tolerance: Patient tolerated treatment well Patient left: in chair;with call bell/phone within reach;with chair alarm set Nurse Communication: Mobility status PT Visit Diagnosis: Difficulty in walking, not elsewhere classified (R26.2);Unsteadiness on feet (R26.81)    Time: 1191-4782 PT Time Calculation (min) (ACUTE ONLY): 18 min   Charges:   PT Evaluation $PT Eval Low Complexity: 1 Low     Kati PT, DPT Acute Rehabilitation Services Pager: [phone] Office: [phone]  Maida Sale E 06/13/2020, 12:49 PM

## 2020-06-13 NOTE — Discharge Instructions (Addendum)
Outpatient Substance Use Treatment Services    [facility]   [address]   [place], [place] [address]    [phone]   Private insurance, Medicare A&B, and GCCN    [facility]    [address]    [place], [place] [address]   [phone]   Medicaid, Self Pay       [facility]        [address]    [place], [place]   [phone]   Medicaid and Private Insurance, Self Pay    [facility]   [address]    [place], [place]    [phone]   Private Insurance, and Self Pay   [facility]        [address]      [place], [place] [address]    [phone] or [phone]   Private Insurance Only   Residential Substance Use Treatment Services       [facility]    [address]    [place], [place] [address]    [phone] or [phone]   Detox (Medicare, Medicaid, private insurance, and self pay)    Residential Rehab 14 days (Medicare, Medicaid, private insurance, and self pay)       [facility]    [address] [place], [place]    [phone]    Female and Female Detox (Self Pay and Medicaid limited availability)    Rehab only Female (Medicaid and self pay only)       [facility]        [address]    [place], [place] [address]    [phone] or [phone]   Detox and Residential Treatment   Private Insurance Only       [facility]    [address]    [place], [place] [address]    [phone]    Treatment Only, must make assessment appointment, and must be sober for assessment appointment.    Self Pay Only, Medicare A&B, [place] Medicaid, [place] ID only!   *Transportation assistance offered from [employer] [address]      [facility]       [address]   [place], [place]  [address]   Walk in interviews M-Sat 8-4p   No pending legal charges   [phone]               [facility]:  [facility]   Referral    [address]   [place], [place]   [phone]   (Self Pay, Medicaid)      [facility]   [address]   [place], [place] [address]   [phone]   Detox and Residential Treatment   Medicare and Private Insurance      [facility]   [address]    [place], [place] [address]   28 Day Women's Facility: [phone]   28 Day Men's Facility: [phone]   Long-term Residential Program:    [phone] Males 25 and Over   (No Insurance, upfront fee)      [facility]    [address]   [place], [place] [address]   [phone]   Private Insurance with [employer], Private Pay     [facility]   [address]  [place], [place] [address]  Local?[phone]     Private Insurance Only      [facility]   [address]    [place], [place] [address]    [phone]   (Males, upfront fee)      [facility] [place]   [address]    [place], [address]   [phone]   Private Insurance    [facility] Locations      [facility]    [address]    [place], [place]    [phone]   [facility] Based Program for individuals experiencing homelessness   Self Pay, No insurance   [facility]    Men's program: [facility]   [address]    [place], [place] [address]   [phone]      [facility]   Women's program: [facility]   [address]  [place], [place] [address]   [phone]   Christian Based Program for individuals experiencing homelessness   Self Pay, No insurance      [facility]   [address]    [place], [place] [address]    [phone]   Christian Based Program for individuals experiencing homelessness   Self Pay, No insurance      [facility]   [address]    , [place] [address]   [phone]   Christian Based Program for individuals experiencing homelessness   Self Pay, No insurance  [facility]   [address] , [place]   [phone]   Christian Based Program for males experiencing homelessness   Self Pay, No insurance                                           [facility]   [address]    [place], [place] [address]   [phone]   Medicaid, Medicare, Private Insurance      [facility]   [address]    [place], [place] [address]   [phone]   Services are free or reduced      [facility]    [address]   [phone]    Self Pay only, sliding scale      [facility]    [address]    [place], [place] [address]   [phone]   ([facility])   Self Pay, Medicaid Only       [facility]   [address]    [place], [place] [address]   [phone]   Medicaid, Medicare, Private Insurance  

## 2020-06-13 NOTE — Progress Notes (Signed)
Shift Summary:  Remained alert and oriented but forgetful. Few episodes of emesis during this.Started on fluids Normal saline 75 ml/hr. Given Zophran per MAR. Poor PO intake. MD started patient on Fentanyl patch. Worked with therapy. Had chest x ray done during this shift. See results. CIWA(1) No other needs identified. Will continue to monitor.

## 2020-06-14 LAB — COMPREHENSIVE METABOLIC PANEL
ALT: 47 U/L — ABNORMAL HIGH (ref 0–44)
AST: 106 U/L — ABNORMAL HIGH (ref 15–41)
Albumin: 2.9 g/dL — ABNORMAL LOW (ref 3.5–5.0)
Alkaline Phosphatase: 113 U/L (ref 38–126)
Anion gap: 10 (ref 5–15)
BUN: 6 mg/dL — ABNORMAL LOW (ref 8–23)
CO2: 31 mmol/L (ref 22–32)
Calcium: 8.2 mg/dL — ABNORMAL LOW (ref 8.9–10.3)
Chloride: 94 mmol/L — ABNORMAL LOW (ref 98–111)
Creatinine, Ser: 0.58 mg/dL (ref 0.44–1.00)
GFR, Estimated: >60 mL/min (ref >60)
Glucose, Bld: 115 mg/dL — ABNORMAL HIGH (ref 70–99)
Potassium: 3 mmol/L — ABNORMAL LOW (ref 3.5–5.1)
Sodium: 135 mmol/L (ref 135–145)
Total Bilirubin: 1.1 mg/dL (ref 0.3–1.2)
Total Protein: 5.6 g/dL — ABNORMAL LOW (ref 6.5–8.1)

## 2020-06-14 MED ORDER — POTASSIUM CHLORIDE IN NACL 20-0.45 MEQ/L-% IV SOLN
INTRAVENOUS | Status: DC
  Filled 2020-06-14: qty 1000

## 2020-06-14 NOTE — Discharge Summary (Signed)
Physician Discharge Summary  Carolyn BrownieJane C Akhavan ZOX:[id number] DOB: 1955/12/31 DOA: 06/12/2020  PCP: Farris HasMorrow, Aaron, MD  Admit date: 06/12/2020 Discharge date: 06/14/2020  Admitted From: Home Disposition: Home   Recommendations for Outpatient Follow-up:  1. Follow up with PCP in 1-2 weeks with repeat CMP, CBC, and panel screening for vitamin/mineral deficiency.  2. Continue alcohol cessation support. 3. Optionally follow up with ENT/plastics Dr. Julien GirtMcDaniel for closed comminuted nasal fracture.  Home Health: PT Equipment/Devices: RW Discharge Condition: Stable CODE STATUS: Full Diet recommendation: As tolerated  Brief/Interim Summary: Carolyn [name] is a 65 y.o. female with a history of alcoholic cirrhosis, chronic pancreatitis, and alcoholism with recent relapse who tripped over a heater at home with resultant facial trauma. In the ED 3/31 she was tachycardic, afebrile, appearing severely dehydrated with AKI, abnormal LFTs, normal lipase, and AGMA. CT cervical spine, head, maxillofacial demonstrated acute comminuted and displaced bilateral nasal bone fracture.XR of left hip, pelvis and chest were negative. Alcohol level was 151. IV fluids were given and admission requested. Fortunately she did not have evidence of severe alcohol withdrawal during admission and on the day of discharge she is ambulatory with assistance and tolerating po.   Discharge Diagnoses:  Active Problems:   Chronic pancreatitis (HCC)   Metabolic acidosis, increased anion gap   Intractable nausea and vomiting  Severe protein calorie malnutrition: BMI 15. At risk of vitamin/mineral deficiencies.  - Continue home supplements and consider further work up.   Intractable nausea and vomiting: Resolved.   Hypokalemia:  - Supplemented, anticipate improvement now that po intake is improving.  Cough: Suspect upper airway cough syndrome with continued normal exam and CXR.  Alcohol abuse, intoxication, risk for withdrawal:  EtOH positive in ED, last drink ~3/30 PM, reports 3-4 glasses wine/night.  - CSW has provided resources. Pt involved in AA, needs to get sponsor.  - No severe alcohol withdrawal noted during admission.  Alcoholic hepatitisand cirrhosis with thrombocytopenia: LFTs improved. INR 1.1, TBili 1.0.  - Supportive care  Thrombocytopenia: Seemingly precipitous drop in platelets is actually just a return to her baseline. The abnormal value was 170 on presentation due to hemoconcentration.  - Continue monitoring for bleeding.   Anemia of chronic disease: Possibly also related to malnutrition.  - Repeat H/H confirmed stability - drop in hgb likely hemodilutional nearer to baseline.  Anion gap metabolic acidosis: Suspect alcoholic ketosis which has resolved with >4L IV fluids.  Fall at home, nasal fracture: Pt without amnesia or LOC. Facial trauma is due to intoxication more likely than true syncope. ECG with sinus tachycardia, no dysrhythmias. No visual impairments.  - Tetanus updated. - Pain control - Discussed with ENT, Dr. Julien GirtMcDaniel by phone who reviewed the case and personally reviewed the CT scan commenting on comminuted nasal fracture that is minimally displaced and currently needs no operative management or face-to-face encounter with ENT. He recommended follow up in his office in 2 weeks where they can discuss management.   Chronic pancreatitis, chronic pain: [facility], reports fentanyl patch 100mcg q2 days. Lipase wnl.  - Continue fentanyl patch. I confirmed fentanyl 100mcg patch prescribed regularly for q2day sig. by Reuben LikesNora Kathryn Tall last 05/28/2020 on PDMP.  - Creon   Discharge Instructions Discharge Instructions    Discharge instructions   Complete by: As directed    You can contact Dr. Julien GirtMcDaniel (information below) for a follow up in 2 weeks to address the nasal fracture if needed. Otherwise, just follow up with  your regular doctor in the next 1-2  weeks for repeat labs.  Continue to abstain from alcohol, and seek medical attention right away if you develop nausea and vomiting again that keeps you from taking fluids by mouth.   Face-to-face encounter (required for Medicare/Medicaid patients)   Complete by: As directed    I Tyrone Nine certify that this patient is under my care and that I, or a nurse practitioner or physician's assistant working with me, had a face-to-face encounter that meets the physician face-to-face encounter requirements with this patient on 06/14/2020. The encounter with the patient was in whole, or in part for the following medical condition(s) which is the primary reason for home health care (List medical condition): Fall at home, gait instability   The encounter with the patient was in whole, or in part, for the following medical condition, which is the primary reason for home health care: Fall at home, gait instability   I certify that, based on my findings, the following services are medically necessary home health services: Physical therapy   Reason for Medically Necessary Home Health Services: Therapy- Home Adaptation to Facilitate Safety   My clinical findings support the need for the above services: Unsafe ambulation due to balance issues   Further, I certify that my clinical findings support that this patient is homebound due to: Unsafe ambulation due to balance issues   For home use only DME Walker rolling   Complete by: As directed    Walker: With 5 Inch Wheels   Patient needs a walker to treat with the following condition: Gait instability   Home Health   Complete by: As directed    To provide the following care/treatments: PT     Allergies as of 06/14/2020      Reactions   Peanut-containing Drug Products Anaphylaxis   Tree nuts      Medication List    STOP taking these medications   oxyCODONE-acetaminophen 5-325 MG tablet Commonly known as: Percocet     TAKE these medications   calcium  carbonate 1250 (500 Ca) MG tablet Commonly known as: OS-CAL - dosed in mg of elemental calcium Take 1 tablet by mouth daily.   famotidine 10 MG tablet Commonly known as: PEPCID Take 10 mg by mouth 2 (two) times daily.   fentaNYL 100 MCG/HR Commonly known as: DURAGESIC Place 1 patch onto the skin every other day.   ibuprofen 200 MG tablet Commonly known as: ADVIL Take 600 mg by mouth every 6 (six) hours as needed for moderate pain.   IRON PO Take 1 tablet by mouth daily.   lipase/protease/amylase 12000-38000 units Cpep capsule Commonly known as: Creon Take 2 capsules (24,000 Units total) by mouth 3 (three) times daily with meals.   multivitamin with minerals Tabs tablet Take 1 tablet by mouth daily.   ondansetron 4 MG tablet Commonly known as: ZOFRAN Take 4 mg by mouth every 8 (eight) hours as needed for nausea or vomiting.   traZODone 50 MG tablet Commonly known as: DESYREL Take 50 mg by mouth at bedtime.   vitamin B-12 100 MCG tablet Commonly known as: CYANOCOBALAMIN Take 100 mcg by mouth daily.   vitamin C 250 MG tablet Commonly known as: ASCORBIC ACID Take 250 mg by mouth daily.            Durable Medical Equipment  (From admission, onward)         Start     Ordered   06/14/20 0000  For  home use only DME Walker rolling       Question Answer Comment  Walker: With 5 Inch Wheels   Patient needs a walker to treat with the following condition Gait instability      06/14/20 1200          Follow-up Information    Lovena Neighbours, MD. Schedule an appointment as soon as possible for a visit in 2 week(s).   Specialty: Plastic Surgery Why: to discuss possile need for surgery for nasal fracture Contact information: [address] [place] [address] [phone]        Farris Has, MD Follow up.   Specialty: Family Medicine Contact information: [address] [place] [address] [phone]               Allergies  Allergen Reactions  . Peanut-Containing Drug Products Anaphylaxis    Tree nuts    Consultations:  ENT  Procedures/Studies: DG Chest 2 View  Result Date: 06/13/2020 CLINICAL DATA:  65 year old female with cough. EXAM: CHEST - 2 VIEW COMPARISON:  06/12/2020 FINDINGS: The mediastinal contours are within normal limits. No cardiomegaly. The lungs are clear bilaterally without evidence of focal consolidation, pleural effusion, or pneumothorax. No acute osseous abnormality. IMPRESSION: No acute cardiopulmonary process. Electronically Signed   By: Marliss Coots MD   On: 06/13/2020 14:47   DG Chest 2 View  Result Date: 06/12/2020 CLINICAL DATA:  Fall EXAM: CHEST - 2 VIEW COMPARISON:  None. FINDINGS: The heart size and mediastinal contours are within normal limits. Both lungs are clear. The visualized skeletal structures are unremarkable. IMPRESSION: No active cardiopulmonary disease. Electronically Signed   By: Deatra Robinson M.D.   On: 06/12/2020 03:14   CT Head Wo Contrast  Addendum Date: 06/12/2020   ADDENDUM REPORT: 06/12/2020 03:59 ADDENDUM: These results were called by telephone at the time of interpretation on 06/12/2020 at 3:59 am to provider COURTNEY HORTON , who verbally acknowledged these results. Electronically Signed   By: Tish Frederickson M.D.   On: 06/12/2020 03:59   Result Date: 06/12/2020 CLINICAL DATA:  Mechanical fall yesterday. Nausea and vomiting. History of seizure. EXAM: CT HEAD WITHOUT CONTRAST CT MAXILLOFACIAL WITHOUT CONTRAST CT CERVICAL SPINE WITHOUT CONTRAST TECHNIQUE: Multidetector CT imaging of the head, cervical spine, and maxillofacial structures were performed using the standard protocol without intravenous contrast. Multiplanar CT image reconstructions of the cervical spine and maxillofacial structures were also generated. COMPARISON:  X-ray cervical spine 06/23/1999 report without imaging FINDINGS: CT HEAD FINDINGS Brain: Patchy and confluent areas of  decreased attenuation are noted throughout the deep and periventricular white matter of the cerebral hemispheres bilaterally, compatible with chronic microvascular ischemic disease. No evidence of large-territorial acute infarction. No parenchymal hemorrhage. No mass lesion. No extra-axial collection. No mass effect or midline shift. No hydrocephalus. Basilar cisterns are patent. Vascular: No hyperdense vessel. Skull: No acute fracture or focal lesion. Other: None. CT MAXILLOFACIAL FINDINGS Osseous: Acute comminuted bilateral, right greater than left, nasal bone fracture. Medially displaced right nasal bone fracture. Sinuses/Orbits: Mucosal thickening of the left sphenoid sinus. Otherwise the remaining paranasal sinuses and mastoid air cells are clear. The orbits are unremarkable. Soft tissues: Subcutaneus soft tissue edema of the nose and maxilla. CT CERVICAL SPINE FINDINGS Alignment: Normal. Skull base and vertebrae: No acute fracture. No aggressive appearing focal osseous lesion or focal pathologic process. Soft tissues and spinal canal: No prevertebral fluid or swelling. No visible canal hematoma. Upper chest: Biapical pleural/pulmonary scarring. Calcified 2  mm left apical nodule. Other: Trace debris within the trachea. IMPRESSION: 1. No acute intracranial abnormality. 2. Acute comminuted and displaced bilateral nasal bone fracture. 3. No acute displaced fracture or traumatic listhesis of the cervical spine. 4. Trace debris within the trachea. Electronically Signed: By: Tish Frederickson M.D. On: 06/12/2020 03:38   CT Cervical Spine Wo Contrast  Addendum Date: 06/12/2020   ADDENDUM REPORT: 06/12/2020 03:59 ADDENDUM: These results were called by telephone at the time of interpretation on 06/12/2020 at 3:59 am to provider COURTNEY HORTON , who verbally acknowledged these results. Electronically Signed   By: Tish Frederickson M.D.   On: 06/12/2020 03:59   Result Date: 06/12/2020 CLINICAL DATA:  Mechanical fall  yesterday. Nausea and vomiting. History of seizure. EXAM: CT HEAD WITHOUT CONTRAST CT MAXILLOFACIAL WITHOUT CONTRAST CT CERVICAL SPINE WITHOUT CONTRAST TECHNIQUE: Multidetector CT imaging of the head, cervical spine, and maxillofacial structures were performed using the standard protocol without intravenous contrast. Multiplanar CT image reconstructions of the cervical spine and maxillofacial structures were also generated. COMPARISON:  X-ray cervical spine 06/23/1999 report without imaging FINDINGS: CT HEAD FINDINGS Brain: Patchy and confluent areas of decreased attenuation are noted throughout the deep and periventricular white matter of the cerebral hemispheres bilaterally, compatible with chronic microvascular ischemic disease. No evidence of large-territorial acute infarction. No parenchymal hemorrhage. No mass lesion. No extra-axial collection. No mass effect or midline shift. No hydrocephalus. Basilar cisterns are patent. Vascular: No hyperdense vessel. Skull: No acute fracture or focal lesion. Other: None. CT MAXILLOFACIAL FINDINGS Osseous: Acute comminuted bilateral, right greater than left, nasal bone fracture. Medially displaced right nasal bone fracture. Sinuses/Orbits: Mucosal thickening of the left sphenoid sinus. Otherwise the remaining paranasal sinuses and mastoid air cells are clear. The orbits are unremarkable. Soft tissues: Subcutaneus soft tissue edema of the nose and maxilla. CT CERVICAL SPINE FINDINGS Alignment: Normal. Skull base and vertebrae: No acute fracture. No aggressive appearing focal osseous lesion or focal pathologic process. Soft tissues and spinal canal: No prevertebral fluid or swelling. No visible canal hematoma. Upper chest: Biapical pleural/pulmonary scarring. Calcified 2 mm left apical nodule. Other: Trace debris within the trachea. IMPRESSION: 1. No acute intracranial abnormality. 2. Acute comminuted and displaced bilateral nasal bone fracture. 3. No acute displaced fracture  or traumatic listhesis of the cervical spine. 4. Trace debris within the trachea. Electronically Signed: By: Tish Frederickson M.D. On: 06/12/2020 03:38   DG Hip Unilat W or Wo Pelvis 2-3 Views Left  Result Date: 06/12/2020 CLINICAL DATA:  Fall EXAM: DG HIP (WITH OR WITHOUT PELVIS) 2-3V LEFT COMPARISON:  None. FINDINGS: There is no evidence of hip fracture or dislocation. There is no evidence of arthropathy or other focal bone abnormality. IMPRESSION: Negative. Electronically Signed   By: Deatra Robinson M.D.   On: 06/12/2020 03:15   CT Maxillofacial Wo Contrast  Addendum Date: 06/12/2020   ADDENDUM REPORT: 06/12/2020 03:59 ADDENDUM: These results were called by telephone at the time of interpretation on 06/12/2020 at 3:59 am to provider COURTNEY HORTON , who verbally acknowledged these results. Electronically Signed   By: Tish Frederickson M.D.   On: 06/12/2020 03:59   Result Date: 06/12/2020 CLINICAL DATA:  Mechanical fall yesterday. Nausea and vomiting. History of seizure. EXAM: CT HEAD WITHOUT CONTRAST CT MAXILLOFACIAL WITHOUT CONTRAST CT CERVICAL SPINE WITHOUT CONTRAST TECHNIQUE: Multidetector CT imaging of the head, cervical spine, and maxillofacial structures were performed using the standard protocol without intravenous contrast. Multiplanar CT image reconstructions of the cervical spine and maxillofacial structures  were also generated. COMPARISON:  X-ray cervical spine 06/23/1999 report without imaging FINDINGS: CT HEAD FINDINGS Brain: Patchy and confluent areas of decreased attenuation are noted throughout the deep and periventricular white matter of the cerebral hemispheres bilaterally, compatible with chronic microvascular ischemic disease. No evidence of large-territorial acute infarction. No parenchymal hemorrhage. No mass lesion. No extra-axial collection. No mass effect or midline shift. No hydrocephalus. Basilar cisterns are patent. Vascular: No hyperdense vessel. Skull: No acute fracture or  focal lesion. Other: None. CT MAXILLOFACIAL FINDINGS Osseous: Acute comminuted bilateral, right greater than left, nasal bone fracture. Medially displaced right nasal bone fracture. Sinuses/Orbits: Mucosal thickening of the left sphenoid sinus. Otherwise the remaining paranasal sinuses and mastoid air cells are clear. The orbits are unremarkable. Soft tissues: Subcutaneus soft tissue edema of the nose and maxilla. CT CERVICAL SPINE FINDINGS Alignment: Normal. Skull base and vertebrae: No acute fracture. No aggressive appearing focal osseous lesion or focal pathologic process. Soft tissues and spinal canal: No prevertebral fluid or swelling. No visible canal hematoma. Upper chest: Biapical pleural/pulmonary scarring. Calcified 2 mm left apical nodule. Other: Trace debris within the trachea. IMPRESSION: 1. No acute intracranial abnormality. 2. Acute comminuted and displaced bilateral nasal bone fracture. 3. No acute displaced fracture or traumatic listhesis of the cervical spine. 4. Trace debris within the trachea. Electronically Signed: By: Tish Frederickson M.D. On: 06/12/2020 03:38     Subjective: Feels much better, no N/V/D, ambulating in halls with assistance. No tremor, numbness or weakness or vision changes.   Discharge Exam: Vitals:   06/13/20 2208 06/14/20 0605  BP: 137/85 (!) 142/94  Pulse: 86 79  Resp:  16  Temp: 98.7 F (37.1 C) 98.7 F (37.1 C)  SpO2: 97% 95%   General: Pt is alert, awake, not in acute distress HEENT: Nasal bridge swelling is stable to improved with hemostatic lac/abrasion. Infraorbital ecchymosis is settling and in process of resolution. Cardiovascular: RRR, S1/S2 +, no rubs, no gallops Respiratory: CTA bilaterally, no wheezing, no rhonchi Abdominal: Soft, NT, ND, bowel sounds + Extremities: No edema, no cyanosis  Labs: BNP (last 3 results) No results for input(s): BNP in the last 8760 hours. Basic Metabolic Panel: Recent Labs  Lab 06/12/20 0330  06/12/20 1706 06/13/20 0432 06/14/20 0453  NA 141 138 134* 135  K 4.7 3.8 3.4* 3.0*  CL 94* 99 99 94*  CO2 20* GLUCOSE 117* 351* 199* 115*  BUN 6*  CREATININE 1.05* 0.86 0.77 0.58  CALCIUM 8.5* 7.7* 8.0* 8.2*  MG 2.0  --   --   --   PHOS 5.0*  --   --   --    Liver Function Tests: Recent Labs  Lab 06/12/20 0330 06/12/20 1706 06/13/20 0432 06/14/20 0453  AST 252* 293* 161* 106*  ALT 75* 72* 57* 47*  ALKPHOS 135* 145* 122 113  BILITOT 1.6* 1.5* 1.0 1.1  PROT 7.1 5.9* 5.3* 5.6*  ALBUMIN 3.8 3.2* 2.8* 2.9*   Recent Labs  Lab 06/12/20 0330  LIPASE 20   No results for input(s): AMMONIA in the last 168 hours. CBC: Recent Labs  Lab 06/12/20 0330 06/13/20 0432 06/13/20 1023  WBC 9.9 4.7  --   NEUTROABS 8.5*  --   --   HGB 12.7 9.4* 9.5*  HCT 38.0 27.7* 27.4*  MCV 94.8 93.6  --   PLT 170 66*  --    Cardiac Enzymes: No results for input(s): CKTOTAL, CKMB, CKMBINDEX, TROPONINI in the  last 168 hours. BNP: Invalid input(s): POCBNP CBG: No results for input(s): GLUCAP in the last 168 hours. D-Dimer No results for input(s): DDIMER in the last 72 hours. Hgb A1c No results for input(s): HGBA1C in the last 72 hours. Lipid Profile No results for input(s): CHOL, HDL, LDLCALC, TRIG, CHOLHDL, LDLDIRECT in the last 72 hours. Thyroid function studies No results for input(s): TSH, T4TOTAL, T3FREE, THYROIDAB in the last 72 hours.  Invalid input(s): FREET3 Anemia work up No results for input(s): VITAMINB12, FOLATE, FERRITIN, TIBC, IRON, RETICCTPCT in the last 72 hours. Urinalysis    Component Value Date/Time   COLORURINE YELLOW 09/30/2017 1449   APPEARANCEUR CLEAR 09/30/2017 1449   LABSPEC <1.005 (L) 09/30/2017 1449   PHURINE 6.5 09/30/2017 1449   GLUCOSEU NEGATIVE 09/30/2017 1449   HGBUR NEGATIVE 09/30/2017 1449   BILIRUBINUR NEGATIVE 09/30/2017 1449   KETONESUR NEGATIVE 09/30/2017 1449   PROTEINUR NEGATIVE 09/30/2017 1449   UROBILINOGEN 4.0 (H)  06/07/2011 0007   NITRITE NEGATIVE 09/30/2017 1449   LEUKOCYTESUR TRACE (A) 09/30/2017 1449    Microbiology Recent Results (from the past 240 hour(s))  Resp Panel by RT-PCR (Flu A&B, Covid) Nasopharyngeal Swab     Status: None   Collection Time: 06/12/20  5:49 AM   Specimen: Nasopharyngeal Swab; Nasopharyngeal(NP) swabs in vial transport medium  Result Value Ref Range Status   SARS Coronavirus 2 by RT PCR NEGATIVE NEGATIVE Final    Comment: (NOTE) SARS-CoV-2 target nucleic acids are NOT DETECTED.  The SARS-CoV-2 RNA is generally detectable in upper respiratory specimens during the acute phase of infection. The lowest concentration of SARS-CoV-2 viral copies this assay can detect is 138 copies/mL. A negative result does not preclude SARS-Cov-2 infection and should not be used as the sole basis for treatment or other patient management decisions. A negative result may occur with  improper specimen collection/handling, submission of specimen other than nasopharyngeal swab, presence of viral mutation(s) within the areas targeted by this assay, and inadequate number of viral copies(<138 copies/mL). A negative result must be combined with clinical observations, patient history, and epidemiological information. The expected result is Negative.  Fact Sheet for Patients:  BloggerCourse.com  Fact Sheet for Healthcare Providers:  SeriousBroker.it  This test is no t yet approved or cleared by the [place] FDA and  has been authorized for detection and/or diagnosis of SARS-CoV-2 by FDA under an Emergency Use Authorization (EUA). This EUA will remain  in effect (meaning this test can be used) for the duration of the COVID-19 declaration under Section 564(b)(1) of the Act, 21 U.S.C.section 360bbb-3(b)(1), unless the authorization is terminated  or revoked sooner.       Influenza A by PCR NEGATIVE NEGATIVE Final   Influenza B by PCR  NEGATIVE NEGATIVE Final    Comment: (NOTE) The Xpert Xpress SARS-CoV-2/FLU/RSV plus assay is intended as an aid in the diagnosis of influenza from Nasopharyngeal swab specimens and should not be used as a sole basis for treatment. Nasal washings and aspirates are unacceptable for Xpert Xpress SARS-CoV-2/FLU/RSV testing.  Fact Sheet for Patients: BloggerCourse.com  Fact Sheet for Healthcare Providers: SeriousBroker.it  This test is not yet approved or cleared by the [place] FDA and has been authorized for detection and/or diagnosis of SARS-CoV-2 by FDA under an Emergency Use Authorization (EUA). This EUA will remain in effect (meaning this test can be used) for the duration of the COVID-19 declaration under Section 564(b)(1) of the Act, 21 U.S.C. section 360bbb-3(b)(1), unless the authorization is terminated or  revoked.  Performed at [facility], [address] [place], [place] [address]     Time coordinating discharge: Approximately 40 minutes  Tyrone Nine, MD  [facility] 06/14/2020, 12:00 PM

## 2020-06-14 NOTE — Progress Notes (Signed)
Husband requested that walker still be delivered, and they go ahead home, and he would return to pick up rolling walker. Husband left phone number [phone] to call when walker available. Checked with MD Jarvis Newcomer and confirmed it was okay for patient to be released before receiving walker. Pushed down by Darl Pikes NA to front entrance.

## 2020-06-14 NOTE — TOC Progression Note (Signed)
Transition of Care [facility]) - Progression Note    Patient Details  Name: Carolyn [name] MRN: [id number] Date of Birth: 10/15/1955  Transition of Care [employer]) CM/SW Contact  Armanda Heritage, RN Phone Number: 06/14/2020, 12:21 PM  Clinical Narrative:    CM spoke with patient who declined [facility] services, requests outpatient PT instead.  OPPT referral placed.  [employer] to deliver rolling walker to bedside.  Expected Discharge Plan: Home w Home Health Services Barriers to Discharge: No Barriers Identified  Expected Discharge Plan and Services Expected Discharge Plan: Home w Home Health Services         Expected Discharge Date: 06/14/20               DME Arranged: Dan Humphreys rolling DME Agency: Other - Comment ([employer]) Date DME Agency Contacted: 06/14/20 Time DME Agency Contacted: 1221 Representative spoke with at DME Agency: [facility] HH Arranged: Refused HH           Social Determinants of Health (SDOH) Interventions    Readmission Risk Interventions No flowsheet data found.

## 2020-06-14 NOTE — Progress Notes (Signed)
Physical Therapy Treatment Patient Details Name: Carolyn [name] MRN: [id number] DOB: 10-Sep-1955 Today's Date: 06/14/2020    History of Present Illness 65 y.o. female with a history of alcoholism with recent relapse, alcoholic cirrhosis, chronic pancreatitis on chronic fentanyl patch who presented to the ED this morning after tripping at home and falling onto her face. She tripped over a heater, recalling the entire episode, fell forward not losing consciousness. She had facial bruising and pain on face. She has been drinking more than before, reporting 3-4 glasses of wine, denying any additional medications/substances, though she applies fentanyl patches as prescribed by pain management and hasn't changed that.    PT Comments    Pt reports feeling better today.  Pt able to tolerate liquids this morning and assisted to bathroom twice during session.  Pt min/guard for mobility for safety and did not require steadying assist.  Pt ambulated 350 ft in the hallway with RW and denies symptoms.  Pt's spouse present and observed today as well.  Pt hopeful for d/c home later today.    Follow Up Recommendations  Home health PT;Supervision for mobility/OOB     Equipment Recommendations  Rolling walker with 5" wheels    Recommendations for Other Services       Precautions / Restrictions Precautions Precautions: Fall    Mobility  Bed Mobility Overal bed mobility: Modified Independent                  Transfers Overall transfer level: Needs assistance Equipment used: Rolling walker (2 wheeled) Transfers: Sit to/from Stand Sit to Stand: Min guard         General transfer comment: verbal cues for hand placement  Ambulation/Gait Ambulation/Gait assistance: Min guard Gait Distance (Feet): 350 Feet Assistive device: Rolling walker (2 wheeled) Gait Pattern/deviations: Step-through pattern;Decreased stride length     General Gait Details: verbal cues for RW positioning, tolerated  improved distance   Stairs             Wheelchair Mobility    Modified Rankin (Stroke Patients Only)       Balance                                            Cognition Arousal/Alertness: Awake/alert Behavior During Therapy: WFL for tasks assessed/performed Overall Cognitive Status: Within Functional Limits for tasks assessed                                        Exercises      General Comments        Pertinent Vitals/Pain Pain Assessment: Faces Faces Pain Scale: Hurts little more Pain Location: facial pain Pain Descriptors / Indicators: Sore;Tender Pain Intervention(s): Monitored during session    Home Living                      Prior Function            PT Goals (current goals can now be found in the care plan section) Progress towards PT goals: Progressing toward goals    Frequency    Min 3X/week      PT Plan Current plan remains appropriate    Co-evaluation              AM-PAC PT "  6 Clicks" Mobility   Outcome Measure  Help needed turning from your back to your side while in a flat bed without using bedrails?: A Little Help needed moving from lying on your back to sitting on the side of a flat bed without using bedrails?: A Little Help needed moving to and from a bed to a chair (including a wheelchair)?: A Little Help needed standing up from a chair using your arms (e.g., wheelchair or bedside chair)?: A Little Help needed to walk in hospital room?: A Little Help needed climbing 3-5 steps with a railing? : A Little 6 Click Score: 18    End of Session Equipment Utilized During Treatment: Gait belt Activity Tolerance: Patient tolerated treatment well Patient left: in chair;with call bell/phone within reach;with family/visitor present Nurse Communication: Mobility status PT Visit Diagnosis: Difficulty in walking, not elsewhere classified (R26.2);Unsteadiness on feet (R26.81)     Time:  0981-1914 PT Time Calculation (min) (ACUTE ONLY): 18 min  Charges:  $Gait Training: 8-22 mins                    Carolyn [name] PT, DPT Acute Rehabilitation Services Pager: [phone] Office: [phone]  Carolyn [name] 06/14/2020, 12:57 PM

## 2020-06-14 NOTE — Plan of Care (Signed)
  Problem: Education: Goal: Knowledge of General Education information will improve Description: Including pain rating scale, medication(s)/side effects and non-pharmacologic comfort measures Outcome: Progressing   Problem: Health Behavior/Discharge Planning: Goal: Ability to manage health-related needs will improve Outcome: Progressing   Problem: Clinical Measurements: Goal: Ability to maintain clinical measurements within normal limits will improve Outcome: Progressing Goal: Will remain free from infection Outcome: Progressing Goal: Diagnostic test results will improve Outcome: Progressing Goal: Respiratory complications will improve Outcome: Progressing Goal: Cardiovascular complication will be avoided Outcome: Progressing   Problem: Pain Managment: Goal: General experience of comfort will improve Outcome: Progressing   Problem: Safety: Goal: Ability to remain free from injury will improve Outcome: Progressing   

## 2020-06-14 NOTE — Progress Notes (Signed)
Carolyn [name] has been delivered, and patient has left cellular device at bedside. Called husband at [phone]. Husband stated, " I am on the way back to retrieve those items." No other needs identified. Awaiting arrival.

## 2020-07-22 ENCOUNTER — Telehealth (HOSPITAL_COMMUNITY): Payer: Self-pay | Admitting: Licensed Clinical Social Worker

## 2020-07-22 ENCOUNTER — Ambulatory Visit (HOSPITAL_COMMUNITY): Payer: Medicare PPO | Admitting: Licensed Clinical Social Worker

## 2020-07-22 ENCOUNTER — Other Ambulatory Visit: Payer: Self-pay

## 2020-07-22 DIAGNOSIS — F1020 Alcohol dependence, uncomplicated: Secondary | ICD-10-CM
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
## 2020-07-25 ENCOUNTER — Ambulatory Visit (HOSPITAL_COMMUNITY): Payer: Self-pay | Admitting: Licensed Clinical Social Worker

## 2020-07-31 ENCOUNTER — Other Ambulatory Visit: Payer: Self-pay

## 2020-07-31 ENCOUNTER — Ambulatory Visit (HOSPITAL_COMMUNITY): Payer: Medicare PPO | Admitting: Licensed Clinical Social Worker

## 2020-08-14 ENCOUNTER — Telehealth (HOSPITAL_COMMUNITY): Payer: Self-pay | Admitting: Licensed Clinical Social Worker

## 2020-08-14 ENCOUNTER — Other Ambulatory Visit: Payer: Self-pay

## 2020-08-14 ENCOUNTER — Ambulatory Visit (HOSPITAL_COMMUNITY): Payer: Medicare PPO | Admitting: Licensed Clinical Social Worker

## 2020-09-04 ENCOUNTER — Other Ambulatory Visit: Payer: Self-pay

## 2020-09-04 ENCOUNTER — Encounter (HOSPITAL_COMMUNITY): Payer: Self-pay | Admitting: Emergency Medicine

## 2020-09-04 ENCOUNTER — Emergency Department (HOSPITAL_COMMUNITY)
Admission: EM | Admit: 2020-09-04 | Discharge: 2020-09-05 | Disposition: A | Discharge status: Home / Self Care | Payer: Medicare PPO | Attending: Emergency Medicine | Admitting: Emergency Medicine

## 2020-09-04 DIAGNOSIS — R110 Nausea: Secondary | ICD-10-CM | POA: Insufficient documentation

## 2020-09-04 DIAGNOSIS — R197 Diarrhea, unspecified: Secondary | ICD-10-CM | POA: Insufficient documentation

## 2020-09-04 DIAGNOSIS — G894 Chronic pain syndrome: Secondary | ICD-10-CM | POA: Insufficient documentation

## 2020-09-04 DIAGNOSIS — R1012 Left upper quadrant pain: Secondary | ICD-10-CM | POA: Diagnosis present

## 2020-09-04 LAB — CBC WITH DIFFERENTIAL/PLATELET
Abs Immature Granulocytes: 0.02 10*3/uL (ref 0.00–0.07)
Basophils Absolute: 0.1 10*3/uL (ref 0.0–0.1)
Basophils Relative: 1 %
Eosinophils Absolute: 0.1 10*3/uL (ref 0.0–0.5)
Eosinophils Relative: 2 %
HCT: 38.2 % (ref 36.0–46.0)
Hemoglobin: 12.7 g/dL (ref 12.0–15.0)
Immature Granulocytes: 0 %
Lymphocytes Relative: 35 %
Lymphs Abs: 1.7 10*3/uL (ref 0.7–4.0)
MCH: 31.7 pg (ref 26.0–34.0)
MCHC: 33.2 g/dL (ref 30.0–36.0)
MCV: 95.3 fL (ref 80.0–100.0)
Monocytes Absolute: 0.3 10*3/uL (ref 0.1–1.0)
Monocytes Relative: 6 %
Neutro Abs: 2.6 10*3/uL (ref 1.7–7.7)
Neutrophils Relative %: 56 %
Platelets: 87 10*3/uL — ABNORMAL LOW (ref 150–400)
RBC: 4.01 MIL/uL (ref 3.87–5.11)
RDW: 14.1 % (ref 11.5–15.5)
WBC: 4.7 10*3/uL (ref 4.0–10.5)
nRBC: 0 % (ref 0.0–0.2)

## 2020-09-04 LAB — COMPREHENSIVE METABOLIC PANEL
ALT: 36 U/L (ref 0–44)
AST: 103 U/L — ABNORMAL HIGH (ref 15–41)
Albumin: 3.6 g/dL (ref 3.5–5.0)
Alkaline Phosphatase: 115 U/L (ref 38–126)
Anion gap: 15 (ref 5–15)
BUN: 13 mg/dL (ref 8–23)
CO2: 24 mmol/L (ref 22–32)
Calcium: 8.4 mg/dL — ABNORMAL LOW (ref 8.9–10.3)
Chloride: 100 mmol/L (ref 98–111)
Creatinine, Ser: 0.92 mg/dL (ref 0.44–1.00)
GFR, Estimated: >60 mL/min (ref >60)
Glucose, Bld: 124 mg/dL — ABNORMAL HIGH (ref 70–99)
Potassium: 3.3 mmol/L — ABNORMAL LOW (ref 3.5–5.1)
Sodium: 139 mmol/L (ref 135–145)
Total Bilirubin: 0.7 mg/dL (ref 0.3–1.2)
Total Protein: 7.3 g/dL (ref 6.5–8.1)

## 2020-09-04 LAB — ETHANOL: Alcohol, Ethyl (B): 376 mg/dL (ref <10)

## 2020-09-04 LAB — PROTIME-INR
INR: 1.1 (ref 0.8–1.2)
Prothrombin Time: 13.9 seconds (ref 11.4–15.2)

## 2020-09-04 LAB — LIPASE, BLOOD: Lipase: 19 U/L (ref 11–51)

## 2020-09-04 NOTE — ED Triage Notes (Signed)
Pt c/o chronic pancreatic pain, states she was taken off her fentanyl patches due to alcohol use. Reports increased pain, states she drank today. Pt also has bruising to right eye, states she hit her eye with the door to her chicken coop.

## 2020-09-04 NOTE — ED Provider Notes (Signed)
Emergency Medicine Provider Triage Evaluation Note  Carolyn [name] , a 65 y.o. female  was evaluated in triage.  Pt complains of pancreas pain.  She states that she has chronic pancreatitis and pancreatic pain from alcohol use.  She states that she has been weaned off of her fentanyl patches as they found out she had been drinking.  She states that she had 1 drink today.  Family member at bedside suggest that it may be more like half of a box of wine. Patient has a bruise on her right eye, states that 3 days ago she went out to close the chicken coop door at night and it hit her in the face..  Review of Systems  Positive: Pancreas pain, alcohol use Negative: Fevers, chest pain  Physical Exam  BP 111/84 (BP Location: Left Arm)   Pulse (!) 101   Temp 97.7 F (36.5 C) (Oral)   Resp 18   LMP 03/10/2009   SpO2 96%  Gen:   Awake, no distress   Resp:  Normal effort  MSK:   Moves extremities without difficulty  Other:  Contusion around right eye  Medical Decision Making  Medically screening exam initiated at 9:15 PM.  Appropriate orders placed.  Carolyn [name] was informed that the remainder of the evaluation will be completed by another provider, this initial triage assessment does not replace that evaluation, and the importance of remaining in the ED until their evaluation is complete.  States that this is an exacerbation of her chronic pancreatic pain due to being weaned off her fentanyl patches as she had been drinking. Will check alcohol, lipase and basic labs.  No chest pain.    Norman Clay 09/04/20 2117    Terald Sleeper, MD 09/05/20 [name]

## 2020-09-05 MED ORDER — HYOSCYAMINE SULFATE SL 0.125 MG SL SUBL: 1.0000 | SUBLINGUAL_TABLET | Freq: Four times a day (QID) | SUBLINGUAL | 0 refills | Status: DC | PRN

## 2020-09-05 MED ORDER — ONDANSETRON 4 MG PO TBDP
4.0000 mg | ORAL_TABLET | Freq: Once | ORAL | Status: AC
  Administered 2020-09-05: 4 mg via ORAL
  Filled 2020-09-05: qty 1

## 2020-09-05 MED ORDER — HYOSCYAMINE SULFATE 0.125 MG SL SUBL
0.2500 mg | SUBLINGUAL_TABLET | Freq: Once | SUBLINGUAL | Status: AC
  Administered 2020-09-05: 0.25 mg via SUBLINGUAL
  Filled 2020-09-05: qty 2

## 2020-09-05 MED ORDER — LIDOCAINE VISCOUS HCL 2 % MT SOLN: 15.0000 mL | Freq: Four times a day (QID) | OROMUCOSAL | 0 refills | Status: DC | PRN

## 2020-09-05 MED ORDER — ALUM & MAG HYDROXIDE-SIMETH 200-200-20 MG/5ML PO SUSP
30.0000 mL | Freq: Once | ORAL | Status: AC
  Administered 2020-09-05: 30 mL via ORAL
  Filled 2020-09-05: qty 30

## 2020-09-05 MED ORDER — LIDOCAINE VISCOUS HCL 2 % MT SOLN
15.0000 mL | Freq: Once | OROMUCOSAL | Status: AC
  Administered 2020-09-05: 15 mL via ORAL
  Filled 2020-09-05: qty 15

## 2020-09-05 NOTE — ED Notes (Signed)
Discharge instructions reviewed and explained, pt verbalized understanding. Resources provided.

## 2020-09-05 NOTE — ED Provider Notes (Signed)
[facility] EMERGENCY DEPARTMENT Provider Note  CSN: [id number] Arrival date & time: 09/04/20 2103  Chief Complaint(s) Pain  HPI Carolyn [name] is a 65 y.o. female   The history is provided by the patient.  Abdominal Pain Pain location:  LUQ Pain quality: aching   Pain radiates to:  L flank Pain severity:  Moderate Onset quality:  Gradual Duration:  2 days Timing:  Constant Progression:  Worsening Chronicity:  Chronic Context: alcohol use   Context comment:  Chronic pancreatitis Relieved by: fentanyl patch, but ran out. Worsened by:  Nothing Associated symptoms: diarrhea and nausea   Associated symptoms: no shortness of breath and no vomiting     Since she was cut back on her pain medications, she is self medicating with alcohol again.  Past Medical History Past Medical History:  Diagnosis Date   Alcoholic cirrhosis (HCC)    Chronic pancreatitis (HCC)    Endometriosis    GERD (gastroesophageal reflux disease)    Liver disease    Malabsorption    MALABSORPTION SYNDROME   Osteoporosis 03/2011   t score -2.5   Pancreatitis    due to cyst and tumors due to calcifications   Seizure (HCC)    no seizure disorder   Vitamin D deficiency 2012   VIT D 15   Patient Active Problem List   Diagnosis Date Noted   Intractable nausea and vomiting 06/13/2020   Metabolic acidosis, increased anion gap 06/12/2020   Osteoporosis 03/16/2011   Endometriosis    Malabsorption    Chronic pancreatitis (HCC)    Seizure (HCC)    Vitamin D deficiency    Home Medication(s) Prior to Admission medications   Medication Sig Start Date End Date Taking? Authorizing Provider  Hyoscyamine Sulfate SL (LEVSIN/SL) 0.125 MG SUBL Place 1 each under the tongue 4 (four) times daily as needed for up to 5 days. 09/05/20 09/10/20 Yes Jenesis Martin, Amadeo Garnet, MD  lidocaine (XYLOCAINE) 2 % solution Use as directed 15 mLs in the mouth or throat every 6 (six) hours as needed for mouth pain.  09/05/20  Yes Furkan Keenum, Amadeo Garnet, MD  calcium carbonate (OS-CAL - DOSED IN MG OF ELEMENTAL CALCIUM) 1250 MG tablet Take 1 tablet by mouth daily.    [provider]  famotidine (PEPCID) 10 MG tablet Take 10 mg by mouth 2 (two) times daily.    [provider]  fentaNYL (DURAGESIC - DOSED MCG/HR) 100 MCG/HR Place 1 patch onto the skin every other day.    [provider]  Ferrous Sulfate (IRON PO) Take 1 tablet by mouth daily.    [provider]  ibuprofen (ADVIL,MOTRIN) 200 MG tablet Take 600 mg by mouth every 6 (six) hours as needed for moderate pain.    [provider]  lipase/protease/amylase (CREON) 12000 units CPEP capsule Take 2 capsules (24,000 Units total) by mouth 3 (three) times daily with meals. 08/24/18   Tressia Danas, MD  Multiple Vitamin (MULITIVITAMIN WITH MINERALS) TABS Take 1 tablet by mouth daily.    [provider]  ondansetron (ZOFRAN) 4 MG tablet Take 4 mg by mouth every 8 (eight) hours as needed for nausea or vomiting.    [provider]  traZODone (DESYREL) 50 MG tablet Take 50 mg by mouth at bedtime.    [provider]  vitamin B-12 (CYANOCOBALAMIN) 100 MCG tablet Take 100 mcg by mouth daily.    [provider]  vitamin C (ASCORBIC ACID) 250 MG tablet Take 250 mg  by mouth daily.    [provider]  PROMETHAZINE HCL PO Take 4 mg by mouth.   06/06/11  [provider]  Ranitidine HCl (ZANTAC PO) Take by mouth.    06/06/11  [provider]                                                                                                                                    Past Surgical History Past Surgical History:  Procedure Laterality Date   APPENDECTOMY     CHOLECYSTECTOMY     FEEDING TUBE RELOCATION  2010   JEJUNOSTOMY FEEDING TUBE  1990   MULTIPLE GASTRIC SURGERIES     MYOMECTOMY     OPEN REDUCTION INTERNAL FIXATION (ORIF) DISTAL RADIAL FRACTURE Left 06/29/2018    Procedure: OPEN REDUCTION INTERNAL FIXATION (ORIF)LEFT  DISTAL RADIAL FRACTURE;  Surgeon: Betha LoaKuzma, Kevin, MD;  Location: [facility];  Service: Orthopedics;  Laterality: Left;   ROUX-EN-Y GASTRIC BYPASS     TUBAL LIGATION     Family History Family History  Problem Relation Age of Onset   Cancer Father        BONE   Breast cancer Maternal Grandmother     Social History Social History   Tobacco Use   Smoking status: Never   Smokeless tobacco: Never  Substance Use Topics   Alcohol use: Not Currently   Drug use: No   Allergies Peanut-containing drug products  Review of Systems Review of Systems  Respiratory:  Negative for shortness of breath.   Gastrointestinal:  Positive for abdominal pain, diarrhea and nausea. Negative for vomiting.  All other systems are reviewed and are negative for acute change except as noted in the HPI  Physical Exam Vital Signs  I have reviewed the triage vital signs BP 111/76   Pulse 84   Temp 97.7 F (36.5 C) (Oral)   Resp 20   Ht 5\' 8"  (1.727 m)   Wt 42.2 kg   LMP 03/10/2009   SpO2 98%   BMI 14.14 kg/m   Physical Exam Vitals reviewed.  Constitutional:      General: She is not in acute distress.    Appearance: She is well-developed. She is not diaphoretic.  HENT:     Head: Normocephalic and atraumatic.     Right Ear: External ear normal.     Left Ear: External ear normal.     Nose: Nose normal.  Eyes:     General: No scleral icterus.    Conjunctiva/sclera: Conjunctivae normal.  Neck:     Trachea: Phonation normal.  Cardiovascular:     Rate and Rhythm: Normal rate and regular rhythm.  Pulmonary:     Effort: Pulmonary effort is normal. No respiratory distress.     Breath sounds: No stridor.  Abdominal:     General: There is no distension.     Tenderness: There is abdominal tenderness in the left upper  quadrant. There is no guarding or rebound.     Comments: Remote scars from prior abd surgeries  Musculoskeletal:         General: Normal range of motion.     Cervical back: Normal range of motion.  Neurological:     Mental Status: She is alert and oriented to person, place, and time.  Psychiatric:        Behavior: Behavior normal.    ED Results and Treatments Labs (all labs ordered are listed, but only abnormal results are displayed) Labs Reviewed  COMPREHENSIVE METABOLIC PANEL - Abnormal; Notable for the following components:      Result Value   Potassium 3.3 (*)    Glucose, Bld 124 (*)    Calcium 8.4 (*)    AST 103 (*)    All other components within normal limits  ETHANOL - Abnormal; Notable for the following components:   Alcohol, Ethyl (B) 376 (*)    All other components within normal limits  CBC WITH DIFFERENTIAL/PLATELET - Abnormal; Notable for the following components:   Platelets 87 (*)    All other components within normal limits  LIPASE, BLOOD  PROTIME-INR                                                                                                                         EKG  EKG Interpretation  Date/Time:    Ventricular Rate:    PR Interval:    QRS Duration:   QT Interval:    QTC Calculation:   R Axis:     Text Interpretation:          Radiology No results found.  Pertinent labs & imaging results that were available during my care of the patient were reviewed by me and considered in my medical decision making (see chart for details).  Medications Ordered in ED Medications  ondansetron (ZOFRAN-ODT) disintegrating tablet 4 mg (4 mg Oral Given 09/05/20 0144)  alum & mag hydroxide-simeth (MAALOX/MYLANTA) 200-200-20 MG/5ML suspension 30 mL (30 mLs Oral Given 09/05/20 0146)    And  lidocaine (XYLOCAINE) 2 % viscous mouth solution 15 mL (15 mLs Oral Given 09/05/20 0146)  hyoscyamine (LEVSIN SL) SL tablet 0.25 mg (0.25 mg Sublingual Given 09/05/20 0145)  Procedures Procedures  (including critical care time)  Medical Decision Making / ED Course I have reviewed the nursing notes for this encounter and the patient's prior records (if available in EHR or on provided paperwork).   LALIA LOUDON was evaluated in Emergency Department on 09/05/2020 for the symptoms described in the history of present illness. She was evaluated in the context of the global COVID-19 pandemic, which necessitated consideration that the patient might be at risk for infection with the SARS-CoV-2 virus that causes COVID-19. Institutional protocols and algorithms that pertain to the evaluation of patients at risk for COVID-19 are in a state of rapid change based on information released by regulatory bodies including the CDC and federal and state organizations. These policies and algorithms were followed during the patient's care in the ED.  Patient here with exacerbation of her chronic abdominal pain in the setting of not having her pain medication.  Patient is self-medicating with alcohol.  Abdomen with very mild discomfort to palpation in left upper quadrant.  No evidence of peritonitis.  Patient still having bowel movements so I have low suspicion for bowel obstruction.  Labs are grossly reassuring without leukocytosis or anemia.  No significant electrolyte derangements or renal sufficiency. Low suspicion for serious intra-abdominal inflammatory/infectious process requiring imaging at this time.  Patient treated symptomatically with GI cocktail which provided some relief.  She is able to tolerate oral intake.     Final Clinical Impression(s) / ED Diagnoses Final diagnoses:  Chronic pain syndrome    The patient appears reasonably screened and/or stabilized for discharge and I doubt any other medical condition or other [facility] requiring further screening, evaluation, or treatment in the ED at this time prior to discharge. Safe for discharge with strict return  precautions.  Disposition: Discharge  Condition: Good  I have discussed the results, Dx and Tx plan with the patient/family who expressed understanding and agree(s) with the plan. Discharge instructions discussed at length. The patient/family was given strict return precautions who verbalized understanding of the instructions. No further questions at time of discharge.    ED Discharge Orders          Ordered    lidocaine (XYLOCAINE) 2 % solution  Every 6 hours PRN        09/05/20 0309    Hyoscyamine Sulfate SL (LEVSIN/SL) 0.125 MG SUBL  4 times daily PRN        09/05/20 0309             Follow Up: Farris Has, MD [address] [place] [address] [phone]  Call  as needed     This chart was dictated using voice recognition software.  Despite best efforts to proofread,  errors can occur which can change the documentation meaning.    Nira Conn, MD 09/05/20 [phone]

## 2020-09-05 NOTE — ED Notes (Signed)
Pt states she was tapered down on her fentanyl patch at the pain clinic and had her last fentanyl patch 50 mcg approx. 3 days ago. Pt further reports her fentanyl patches were tapered d/t alcohol use which was within her contract to avoid.

## 2020-09-05 NOTE — Discharge Instructions (Addendum)
It was a pleasure meeting you this evening. Remember... Small goals, small victories, 1 step at a time, 1 day at a time!  Before you know it, you look back and be thankful for the first few steps you taken forward.

## 2022-01-06 ENCOUNTER — Other Ambulatory Visit: Payer: Self-pay

## 2022-01-06 ENCOUNTER — Inpatient Hospital Stay (HOSPITAL_BASED_OUTPATIENT_CLINIC_OR_DEPARTMENT_OTHER)
Admission: EM | Admit: 2022-01-06 | Discharge: 2022-01-25 | DRG: 602 | Disposition: A | Discharge status: Skilled Nursing Facility | Payer: Medicare PPO | Attending: Internal Medicine | Admitting: Internal Medicine

## 2022-01-06 ENCOUNTER — Encounter (HOSPITAL_BASED_OUTPATIENT_CLINIC_OR_DEPARTMENT_OTHER): Payer: Self-pay | Admitting: Urology

## 2022-01-06 ENCOUNTER — Encounter (HOSPITAL_COMMUNITY): Payer: Self-pay

## 2022-01-06 ENCOUNTER — Emergency Department (HOSPITAL_BASED_OUTPATIENT_CLINIC_OR_DEPARTMENT_OTHER): Payer: Medicare PPO

## 2022-01-06 DIAGNOSIS — M79671 Pain in right foot: Secondary | ICD-10-CM | POA: Diagnosis not present

## 2022-01-06 DIAGNOSIS — R64 Cachexia: Secondary | ICD-10-CM | POA: Diagnosis present

## 2022-01-06 DIAGNOSIS — R140 Abdominal distension (gaseous): Secondary | ICD-10-CM

## 2022-01-06 DIAGNOSIS — J9811 Atelectasis: Secondary | ICD-10-CM | POA: Diagnosis present

## 2022-01-06 DIAGNOSIS — L03115 Cellulitis of right lower limb: Secondary | ICD-10-CM | POA: Diagnosis not present

## 2022-01-06 DIAGNOSIS — L89151 Pressure ulcer of sacral region, stage 1: Secondary | ICD-10-CM | POA: Diagnosis present

## 2022-01-06 DIAGNOSIS — Z9884 Bariatric surgery status: Secondary | ICD-10-CM

## 2022-01-06 DIAGNOSIS — M869 Osteomyelitis, unspecified: Principal | ICD-10-CM

## 2022-01-06 DIAGNOSIS — R935 Abnormal findings on diagnostic imaging of other abdominal regions, including retroperitoneum: Secondary | ICD-10-CM

## 2022-01-06 DIAGNOSIS — D696 Thrombocytopenia, unspecified: Secondary | ICD-10-CM | POA: Diagnosis present

## 2022-01-06 DIAGNOSIS — I8510 Secondary esophageal varices without bleeding: Secondary | ICD-10-CM | POA: Diagnosis present

## 2022-01-06 DIAGNOSIS — E8809 Other disorders of plasma-protein metabolism, not elsewhere classified: Secondary | ICD-10-CM | POA: Diagnosis present

## 2022-01-06 DIAGNOSIS — L97519 Non-pressure chronic ulcer of other part of right foot with unspecified severity: Secondary | ICD-10-CM | POA: Diagnosis present

## 2022-01-06 DIAGNOSIS — Z91010 Allergy to peanuts: Secondary | ICD-10-CM

## 2022-01-06 DIAGNOSIS — Z791 Long term (current) use of non-steroidal anti-inflammatories (NSAID): Secondary | ICD-10-CM

## 2022-01-06 DIAGNOSIS — K7031 Alcoholic cirrhosis of liver with ascites: Secondary | ICD-10-CM | POA: Diagnosis present

## 2022-01-06 DIAGNOSIS — E876 Hypokalemia: Secondary | ICD-10-CM

## 2022-01-06 DIAGNOSIS — Z9049 Acquired absence of other specified parts of digestive tract: Secondary | ICD-10-CM

## 2022-01-06 DIAGNOSIS — D638 Anemia in other chronic diseases classified elsewhere: Secondary | ICD-10-CM | POA: Diagnosis present

## 2022-01-06 DIAGNOSIS — K860 Alcohol-induced chronic pancreatitis: Secondary | ICD-10-CM | POA: Diagnosis present

## 2022-01-06 DIAGNOSIS — J918 Pleural effusion in other conditions classified elsewhere: Secondary | ICD-10-CM | POA: Diagnosis present

## 2022-01-06 DIAGNOSIS — R948 Abnormal results of function studies of other organs and systems: Secondary | ICD-10-CM | POA: Diagnosis present

## 2022-01-06 DIAGNOSIS — E43 Unspecified severe protein-calorie malnutrition: Secondary | ICD-10-CM | POA: Diagnosis present

## 2022-01-06 DIAGNOSIS — J9601 Acute respiratory failure with hypoxia: Secondary | ICD-10-CM | POA: Diagnosis present

## 2022-01-06 DIAGNOSIS — F1010 Alcohol abuse, uncomplicated: Secondary | ICD-10-CM | POA: Diagnosis present

## 2022-01-06 DIAGNOSIS — R601 Generalized edema: Secondary | ICD-10-CM

## 2022-01-06 DIAGNOSIS — L97529 Non-pressure chronic ulcer of other part of left foot with unspecified severity: Secondary | ICD-10-CM | POA: Diagnosis present

## 2022-01-06 DIAGNOSIS — K766 Portal hypertension: Secondary | ICD-10-CM | POA: Diagnosis present

## 2022-01-06 DIAGNOSIS — N809 Endometriosis, unspecified: Secondary | ICD-10-CM | POA: Diagnosis present

## 2022-01-06 DIAGNOSIS — J90 Pleural effusion, not elsewhere classified: Secondary | ICD-10-CM

## 2022-01-06 DIAGNOSIS — E8770 Fluid overload, unspecified: Secondary | ICD-10-CM | POA: Diagnosis present

## 2022-01-06 DIAGNOSIS — Z9851 Tubal ligation status: Secondary | ICD-10-CM

## 2022-01-06 DIAGNOSIS — N179 Acute kidney failure, unspecified: Secondary | ICD-10-CM | POA: Diagnosis present

## 2022-01-06 DIAGNOSIS — D62 Acute posthemorrhagic anemia: Secondary | ICD-10-CM | POA: Diagnosis present

## 2022-01-06 DIAGNOSIS — L03116 Cellulitis of left lower limb: Secondary | ICD-10-CM | POA: Diagnosis present

## 2022-01-06 DIAGNOSIS — K219 Gastro-esophageal reflux disease without esophagitis: Secondary | ICD-10-CM | POA: Diagnosis present

## 2022-01-06 DIAGNOSIS — R262 Difficulty in walking, not elsewhere classified: Secondary | ICD-10-CM | POA: Diagnosis present

## 2022-01-06 DIAGNOSIS — K859 Acute pancreatitis without necrosis or infection, unspecified: Secondary | ICD-10-CM | POA: Diagnosis present

## 2022-01-06 DIAGNOSIS — Z681 Body mass index (BMI) 19 or less, adult: Secondary | ICD-10-CM

## 2022-01-06 DIAGNOSIS — K2950 Unspecified chronic gastritis without bleeding: Secondary | ICD-10-CM | POA: Diagnosis present

## 2022-01-06 DIAGNOSIS — M2042 Other hammer toe(s) (acquired), left foot: Secondary | ICD-10-CM | POA: Diagnosis present

## 2022-01-06 DIAGNOSIS — G894 Chronic pain syndrome: Secondary | ICD-10-CM | POA: Diagnosis present

## 2022-01-06 DIAGNOSIS — M6284 Sarcopenia: Secondary | ICD-10-CM | POA: Diagnosis present

## 2022-01-06 DIAGNOSIS — Z79899 Other long term (current) drug therapy: Secondary | ICD-10-CM

## 2022-01-06 DIAGNOSIS — K7030 Alcoholic cirrhosis of liver without ascites: Secondary | ICD-10-CM

## 2022-01-06 DIAGNOSIS — K255 Chronic or unspecified gastric ulcer with perforation: Secondary | ICD-10-CM | POA: Diagnosis present

## 2022-01-06 DIAGNOSIS — K3189 Other diseases of stomach and duodenum: Secondary | ICD-10-CM | POA: Diagnosis present

## 2022-01-06 DIAGNOSIS — G629 Polyneuropathy, unspecified: Secondary | ICD-10-CM | POA: Diagnosis present

## 2022-01-06 DIAGNOSIS — R195 Other fecal abnormalities: Secondary | ICD-10-CM | POA: Diagnosis present

## 2022-01-06 DIAGNOSIS — Z91018 Allergy to other foods: Secondary | ICD-10-CM

## 2022-01-06 DIAGNOSIS — R5381 Other malaise: Secondary | ICD-10-CM

## 2022-01-06 DIAGNOSIS — K861 Other chronic pancreatitis: Secondary | ICD-10-CM | POA: Diagnosis present

## 2022-01-06 DIAGNOSIS — L8990 Pressure ulcer of unspecified site, unspecified stage: Secondary | ICD-10-CM | POA: Insufficient documentation

## 2022-01-06 DIAGNOSIS — D649 Anemia, unspecified: Secondary | ICD-10-CM

## 2022-01-06 DIAGNOSIS — G8929 Other chronic pain: Secondary | ICD-10-CM

## 2022-01-06 DIAGNOSIS — M810 Age-related osteoporosis without current pathological fracture: Secondary | ICD-10-CM | POA: Diagnosis present

## 2022-01-06 DIAGNOSIS — M2041 Other hammer toe(s) (acquired), right foot: Secondary | ICD-10-CM | POA: Diagnosis present

## 2022-01-06 LAB — CBC WITH DIFFERENTIAL/PLATELET
Abs Immature Granulocytes: 0.03 10*3/uL (ref 0.00–0.07)
Basophils Absolute: 0 10*3/uL (ref 0.0–0.1)
Basophils Relative: 0 %
Eosinophils Absolute: 0 10*3/uL (ref 0.0–0.5)
Eosinophils Relative: 0 %
HCT: 32.5 % — ABNORMAL LOW (ref 36.0–46.0)
Hemoglobin: 10.9 g/dL — ABNORMAL LOW (ref 12.0–15.0)
Immature Granulocytes: 0 %
Lymphocytes Relative: 17 %
Lymphs Abs: 1.8 10*3/uL (ref 0.7–4.0)
MCH: 31 pg (ref 26.0–34.0)
MCHC: 33.5 g/dL (ref 30.0–36.0)
MCV: 92.3 fL (ref 80.0–100.0)
Monocytes Absolute: 0.4 10*3/uL (ref 0.1–1.0)
Monocytes Relative: 4 %
Neutro Abs: 8.1 10*3/uL — ABNORMAL HIGH (ref 1.7–7.7)
Neutrophils Relative %: 79 %
Platelets: 180 10*3/uL (ref 150–400)
RBC: 3.52 MIL/uL — ABNORMAL LOW (ref 3.87–5.11)
RDW: 13.9 % (ref 11.5–15.5)
WBC: 10.3 10*3/uL (ref 4.0–10.5)
nRBC: 0 % (ref 0.0–0.2)

## 2022-01-06 LAB — MAGNESIUM: Magnesium: 1.5 mg/dL — ABNORMAL LOW (ref 1.7–2.4)

## 2022-01-06 LAB — SEDIMENTATION RATE: Sed Rate: 15 mm/hr (ref 0–22)

## 2022-01-06 LAB — BASIC METABOLIC PANEL
Anion gap: 10 (ref 5–15)
BUN: 23 mg/dL (ref 8–23)
CO2: 28 mmol/L (ref 22–32)
Calcium: 7.2 mg/dL — ABNORMAL LOW (ref 8.9–10.3)
Chloride: 96 mmol/L — ABNORMAL LOW (ref 98–111)
Creatinine, Ser: 1.18 mg/dL — ABNORMAL HIGH (ref 0.44–1.00)
GFR, Estimated: 52 mL/min — ABNORMAL LOW (ref >60)
Glucose, Bld: 127 mg/dL — ABNORMAL HIGH (ref 70–99)
Potassium: 2.1 mmol/L — CL (ref 3.5–5.1)
Sodium: 134 mmol/L — ABNORMAL LOW (ref 135–145)

## 2022-01-06 LAB — LACTIC ACID, PLASMA: Lactic Acid, Venous: 1.7 mmol/L (ref 0.5–1.9)

## 2022-01-06 MED ORDER — ONDANSETRON 4 MG PO TBDP
8.0000 mg | ORAL_TABLET | Freq: Once | ORAL | Status: AC
  Administered 2022-01-06: 8 mg via ORAL
  Filled 2022-01-06: qty 2

## 2022-01-06 MED ORDER — OXYCODONE-ACETAMINOPHEN 5-325 MG PO TABS
1.0000 | ORAL_TABLET | Freq: Once | ORAL | Status: AC
  Administered 2022-01-06: 1 via ORAL
  Filled 2022-01-06: qty 1

## 2022-01-06 MED ORDER — ONDANSETRON HCL 4 MG/2ML IJ SOLN
4.0000 mg | Freq: Three times a day (TID) | INTRAMUSCULAR | Status: DC | PRN
  Administered 2022-01-07 – 2022-01-20 (×6): 4 mg via INTRAVENOUS
  Filled 2022-01-06 (×7): qty 2

## 2022-01-06 MED ORDER — MAGNESIUM SULFATE 2 GM/50ML IV SOLN
2.0000 g | Freq: Once | INTRAVENOUS | Status: AC
  Administered 2022-01-06: 2 g via INTRAVENOUS
  Filled 2022-01-06: qty 50

## 2022-01-06 MED ORDER — METRONIDAZOLE 500 MG/100ML IV SOLN
500.0000 mg | Freq: Two times a day (BID) | INTRAVENOUS | Status: DC
  Administered 2022-01-06 – 2022-01-09 (×6): 500 mg via INTRAVENOUS
  Filled 2022-01-06 (×6): qty 100

## 2022-01-06 MED ORDER — SODIUM CHLORIDE 0.9 % IV SOLN
2.0000 g | INTRAVENOUS | Status: DC
  Administered 2022-01-06 – 2022-01-08 (×3): 2 g via INTRAVENOUS
  Filled 2022-01-06 (×3): qty 20

## 2022-01-06 MED ORDER — POTASSIUM CHLORIDE CRYS ER 20 MEQ PO TBCR
40.0000 meq | EXTENDED_RELEASE_TABLET | Freq: Once | ORAL | Status: AC
  Administered 2022-01-06: 40 meq via ORAL
  Filled 2022-01-06: qty 2

## 2022-01-06 MED ORDER — SODIUM CHLORIDE 0.9 % IV SOLN: Freq: Once | INTRAVENOUS | Status: AC

## 2022-01-06 MED ORDER — POTASSIUM CHLORIDE 10 MEQ/100ML IV SOLN
10.0000 meq | INTRAVENOUS | Status: AC
  Administered 2022-01-07: 10 meq via INTRAVENOUS
  Filled 2022-01-06: qty 100

## 2022-01-06 MED ORDER — FENTANYL CITRATE PF 50 MCG/ML IJ SOSY
50.0000 ug | PREFILLED_SYRINGE | INTRAMUSCULAR | Status: DC | PRN
  Administered 2022-01-07 (×4): 50 ug via INTRAVENOUS
  Filled 2022-01-06 (×4): qty 1

## 2022-01-06 NOTE — ED Triage Notes (Signed)
Came from [facility], states got wound from wearing boots a few weeks ago  Wound on arch of right foot, states has cut on 2nd toe as well  Wounds not healing and getting worse  H/o neuropathy

## 2022-01-06 NOTE — ED Notes (Signed)
Provider aware, unable to get second blood culture. No new orders.

## 2022-01-06 NOTE — Plan of Care (Signed)
[facility] will assume care on arrival to accepting facility. Until arrival, care as per EDP. However, [facility] available 24/7 for questions and assistance.  Nursing staff, please page [facility] Admits and Consults ([phone]) as soon as the patient arrives to the hospital.   

## 2022-01-06 NOTE — ED Provider Notes (Signed)
[facility] EMERGENCY DEPARTMENT Provider Note   CSN: [id number] Arrival date & time: 01/06/22  1841     History {Add pertinent medical, surgical, social history, OB history to HPI:1} Chief Complaint  Patient presents with   Foot Wound    Carolyn [name] is a 66 y.o. female.  She has some history of neuropathy in her feet and possibly is an untreated diabetic.  She said she worsened malfitting boots a few weeks ago causing a large blister on the sole of her foot.  That is since opened but has not healed.  Continues with pain and wound there along with a new wound on her second toe from stubbing it.  She was seen in urgent care today was concerned she may have osteomyelitis and sent her here.  Denies any fever.  The history is provided by the patient and the spouse.  Foot Injury Location:  Foot and toe Foot location:  Sole of R foot Toe location:  R second toe Pain details:    Progression:  Worsening Chronicity:  New Relieved by:  Nothing Worsened by:  Bearing weight Ineffective treatments: Bacitracin. Associated symptoms: no fever        Home Medications Prior to Admission medications   Medication Sig Start Date End Date Taking? Authorizing Provider  calcium carbonate (OS-CAL - DOSED IN MG OF ELEMENTAL CALCIUM) 1250 MG tablet Take 1 tablet by mouth daily.    [provider]  famotidine (PEPCID) 10 MG tablet Take 10 mg by mouth 2 (two) times daily.    [provider]  fentaNYL (DURAGESIC - DOSED MCG/HR) 100 MCG/HR Place 1 patch onto the skin every other day.    [provider]  Ferrous Sulfate (IRON PO) Take 1 tablet by mouth daily.    [provider]  Hyoscyamine Sulfate SL (LEVSIN/SL) 0.125 MG SUBL Place 1 each under the tongue 4 (four) times daily as needed for up to 5 days. 09/05/20 09/10/20  Fatima Blank, MD  ibuprofen (ADVIL,MOTRIN) 200 MG tablet Take 600 mg by mouth every 6 (six) hours as needed for moderate pain.     [provider]  lidocaine (XYLOCAINE) 2 % solution Use as directed 15 mLs in the mouth or throat every 6 (six) hours as needed for mouth pain. 09/05/20   Fatima Blank, MD  lipase/protease/amylase (CREON) 12000 units CPEP capsule Take 2 capsules (24,000 Units total) by mouth 3 (three) times daily with meals. 08/24/18   Thornton Park, MD  Multiple Vitamin (MULITIVITAMIN WITH MINERALS) TABS Take 1 tablet by mouth daily.    [provider]  ondansetron (ZOFRAN) 4 MG tablet Take 4 mg by mouth every 8 (eight) hours as needed for nausea or vomiting.    [provider]  traZODone (DESYREL) 50 MG tablet Take 50 mg by mouth at bedtime.    [provider]  vitamin B-12 (CYANOCOBALAMIN) 100 MCG tablet Take 100 mcg by mouth daily.    [provider]  vitamin C (ASCORBIC ACID) 250 MG tablet Take 250 mg by mouth daily.    [provider]  PROMETHAZINE HCL PO Take 4 mg by mouth.   06/06/11  [provider]  Ranitidine HCl (ZANTAC PO) Take by mouth.    06/06/11  [provider]      Allergies    Peanut-containing drug products    Review of Systems   Review of Systems  Constitutional:  Negative for fever.  Skin:  Positive for wound.  Physical Exam Updated Vital Signs BP 130/89 (BP Location: Left Arm)   Pulse (!) 129   Temp 98 F (36.7 C)   Resp 18   Ht 5\' 8"  (1.727 m)   Wt 49.9 kg   LMP 03/10/2009   SpO2 99%   BMI 16.73 kg/m  Physical Exam Vitals and nursing note reviewed.  Constitutional:      General: She is not in acute distress.    Appearance: Normal appearance. She is well-developed.  HENT:     Head: Normocephalic and atraumatic.  Eyes:     Conjunctiva/sclera: Conjunctivae normal.  Cardiovascular:     Rate and Rhythm: Normal rate and regular rhythm.     Heart sounds: No murmur heard. Pulmonary:     Effort: Pulmonary effort is normal. No respiratory distress.     Breath sounds: Normal breath sounds.   Abdominal:     Palpations: Abdomen is soft.     Tenderness: There is no abdominal tenderness.  Musculoskeletal:        General: Tenderness present.     Cervical back: Neck supple.     Comments: She has a large open blistered area on the sole of her right foot extending into her instep.  There is also approximately 1 cm ulceration at the tip of her second toe.  There is some more superficial skin ulcerations on her foot and up her shin.  No significant swelling or cellulitis.  She does have decreased sensation to light touch  Skin:    General: Skin is warm and dry.     Capillary Refill: Capillary refill takes less than 2 seconds.  Neurological:     General: No focal deficit present.     Mental Status: She is alert.        ED Results / Procedures / Treatments   Labs (all labs ordered are listed, but only abnormal results are displayed) Labs Reviewed  BASIC METABOLIC PANEL - Abnormal; Notable for the following components:      Result Value   Sodium 134 (*)    Potassium 2.1 (*)    Chloride 96 (*)    Glucose, Bld 127 (*)    Creatinine, Ser 1.18 (*)    Calcium 7.2 (*)    GFR, Estimated 52 (*)    All other components within normal limits  CBC WITH DIFFERENTIAL/PLATELET - Abnormal; Notable for the following components:   RBC 3.52 (*)    Hemoglobin 10.9 (*)    HCT 32.5 (*)    Neutro Abs 8.1 (*)    All other components within normal limits  MAGNESIUM - Abnormal; Notable for the following components:   Magnesium 1.5 (*)    All other components within normal limits  CULTURE, BLOOD (ROUTINE X 2)  CULTURE, BLOOD (ROUTINE X 2)  SEDIMENTATION RATE  LACTIC ACID, PLASMA  HEMOGLOBIN A1C  C-REACTIVE PROTEIN    EKG None  Radiology DG Foot Complete Right  Result Date: 01/06/2022 CLINICAL DATA:  Clinical concern for osteomyelitis of the second toe. EXAM: RIGHT FOOT COMPLETE - 3+ VIEW COMPARISON:  None Available. FINDINGS: There is diffuse decreased bone mineralization. Mild  interphalangeal joint space narrowing diffusely. Mild-to-moderate third and mild rest of the tarsometatarsals joint space narrowing. There is moderate distal second toe soft tissue swelling. There is mild erosion of the distal, minimally medial aspect of the distal phalanx of the second toe concerning for acute osteomyelitis. No acute fracture is seen.  No dislocation. IMPRESSION: Moderate distal second toe soft tissue  swelling. Mild erosion of the distal, minimally medial aspect of the distal phalanx of the second toe concerning for acute osteomyelitis. Electronically Signed   By: Neita Garnet M.D.   On: 01/06/2022 20:12    Procedures Procedures  {Document cardiac monitor, telemetry assessment procedure when appropriate:1}  Medications Ordered in ED Medications  cefTRIAXone (ROCEPHIN) 2 g in sodium chloride 0.9 % 100 mL IVPB (2 g Intravenous New Bag/Given 01/06/22 2046)    And  metroNIDAZOLE (FLAGYL) IVPB 500 mg (has no administration in time range)  ondansetron (ZOFRAN-ODT) disintegrating tablet 8 mg (has no administration in time range)  oxyCODONE-acetaminophen (PERCOCET/ROXICET) 5-325 MG per tablet 1 tablet (has no administration in time range)  potassium chloride SA (KLOR-CON M) CR tablet 40 mEq (has no administration in time range)  magnesium sulfate IVPB 2 g 50 mL (has no administration in time range)    ED Course/ Medical Decision Making/ A&P                           Medical Decision Making Amount and/or Complexity of Data Reviewed Labs: ordered. Radiology: ordered.  Risk Prescription drug management.   This patient complains of ***; this involves an extensive number of treatment Options and is a complaint that carries with it a high risk of complications and morbidity. The differential includes ***  I ordered, reviewed and interpreted labs, which included *** I ordered medication *** and reviewed PMP when indicated. I ordered imaging studies which included *** and I  independently    visualized and interpreted imaging which showed *** Additional history obtained from *** Previous records obtained and reviewed *** I consulted *** and discussed lab and imaging findings and discussed disposition.  Cardiac monitoring reviewed, *** Social determinants considered, *** Critical Interventions: ***  After the interventions stated above, I reevaluated the patient and found *** Admission and further testing considered, ***   {Document critical care time when appropriate:1} {Document review of labs and clinical decision tools ie heart score, Chads2Vasc2 etc:1}  {Document your independent review of radiology images, and any outside records:1} {Document your discussion with family members, caretakers, and with consultants:1} {Document social determinants of health affecting pt's care:1} {Document your decision making why or why not admission, treatments were needed:1} Final Clinical Impression(s) / ED Diagnoses Final diagnoses:  None    Rx / DC Orders ED Discharge Orders     None

## 2022-01-07 ENCOUNTER — Inpatient Hospital Stay (HOSPITAL_COMMUNITY): Admit: 2022-01-07 | Payer: Medicare PPO

## 2022-01-07 ENCOUNTER — Observation Stay (HOSPITAL_COMMUNITY): Payer: Medicare PPO

## 2022-01-07 ENCOUNTER — Inpatient Hospital Stay (HOSPITAL_COMMUNITY): Payer: Medicare PPO

## 2022-01-07 ENCOUNTER — Inpatient Hospital Stay: Payer: Self-pay

## 2022-01-07 DIAGNOSIS — L97519 Non-pressure chronic ulcer of other part of right foot with unspecified severity: Secondary | ICD-10-CM | POA: Diagnosis present

## 2022-01-07 DIAGNOSIS — I8510 Secondary esophageal varices without bleeding: Secondary | ICD-10-CM | POA: Diagnosis present

## 2022-01-07 DIAGNOSIS — J918 Pleural effusion in other conditions classified elsewhere: Secondary | ICD-10-CM | POA: Diagnosis present

## 2022-01-07 DIAGNOSIS — E43 Unspecified severe protein-calorie malnutrition: Secondary | ICD-10-CM

## 2022-01-07 DIAGNOSIS — N179 Acute kidney failure, unspecified: Secondary | ICD-10-CM

## 2022-01-07 DIAGNOSIS — R008 Other abnormalities of heart beat: Secondary | ICD-10-CM | POA: Diagnosis not present

## 2022-01-07 DIAGNOSIS — K255 Chronic or unspecified gastric ulcer with perforation: Secondary | ICD-10-CM | POA: Diagnosis present

## 2022-01-07 DIAGNOSIS — Z7189 Other specified counseling: Secondary | ICD-10-CM | POA: Diagnosis not present

## 2022-01-07 DIAGNOSIS — K259 Gastric ulcer, unspecified as acute or chronic, without hemorrhage or perforation: Secondary | ICD-10-CM | POA: Diagnosis not present

## 2022-01-07 DIAGNOSIS — Z681 Body mass index (BMI) 19 or less, adult: Secondary | ICD-10-CM | POA: Diagnosis not present

## 2022-01-07 DIAGNOSIS — K219 Gastro-esophageal reflux disease without esophagitis: Secondary | ICD-10-CM

## 2022-01-07 DIAGNOSIS — D62 Acute posthemorrhagic anemia: Secondary | ICD-10-CM | POA: Diagnosis present

## 2022-01-07 DIAGNOSIS — R601 Generalized edema: Secondary | ICD-10-CM | POA: Diagnosis not present

## 2022-01-07 DIAGNOSIS — J9601 Acute respiratory failure with hypoxia: Secondary | ICD-10-CM | POA: Diagnosis present

## 2022-01-07 DIAGNOSIS — E876 Hypokalemia: Secondary | ICD-10-CM

## 2022-01-07 DIAGNOSIS — M869 Osteomyelitis, unspecified: Secondary | ICD-10-CM

## 2022-01-07 DIAGNOSIS — R531 Weakness: Secondary | ICD-10-CM | POA: Diagnosis not present

## 2022-01-07 DIAGNOSIS — Z515 Encounter for palliative care: Secondary | ICD-10-CM | POA: Diagnosis not present

## 2022-01-07 DIAGNOSIS — M86671 Other chronic osteomyelitis, right ankle and foot: Secondary | ICD-10-CM

## 2022-01-07 DIAGNOSIS — L03115 Cellulitis of right lower limb: Secondary | ICD-10-CM

## 2022-01-07 DIAGNOSIS — K859 Acute pancreatitis without necrosis or infection, unspecified: Secondary | ICD-10-CM | POA: Diagnosis present

## 2022-01-07 DIAGNOSIS — L0390 Cellulitis, unspecified: Secondary | ICD-10-CM | POA: Diagnosis not present

## 2022-01-07 DIAGNOSIS — G8929 Other chronic pain: Secondary | ICD-10-CM

## 2022-01-07 DIAGNOSIS — D696 Thrombocytopenia, unspecified: Secondary | ICD-10-CM | POA: Diagnosis present

## 2022-01-07 DIAGNOSIS — J948 Other specified pleural conditions: Secondary | ICD-10-CM | POA: Diagnosis not present

## 2022-01-07 DIAGNOSIS — K860 Alcohol-induced chronic pancreatitis: Secondary | ICD-10-CM | POA: Diagnosis present

## 2022-01-07 DIAGNOSIS — K7031 Alcoholic cirrhosis of liver with ascites: Secondary | ICD-10-CM | POA: Diagnosis present

## 2022-01-07 DIAGNOSIS — D649 Anemia, unspecified: Secondary | ICD-10-CM

## 2022-01-07 DIAGNOSIS — K3189 Other diseases of stomach and duodenum: Secondary | ICD-10-CM | POA: Diagnosis not present

## 2022-01-07 DIAGNOSIS — M79671 Pain in right foot: Secondary | ICD-10-CM | POA: Diagnosis present

## 2022-01-07 DIAGNOSIS — L97412 Non-pressure chronic ulcer of right heel and midfoot with fat layer exposed: Secondary | ICD-10-CM

## 2022-01-07 DIAGNOSIS — J9811 Atelectasis: Secondary | ICD-10-CM | POA: Diagnosis present

## 2022-01-07 DIAGNOSIS — R64 Cachexia: Secondary | ICD-10-CM | POA: Diagnosis present

## 2022-01-07 DIAGNOSIS — K766 Portal hypertension: Secondary | ICD-10-CM | POA: Diagnosis present

## 2022-01-07 DIAGNOSIS — R935 Abnormal findings on diagnostic imaging of other abdominal regions, including retroperitoneum: Secondary | ICD-10-CM | POA: Diagnosis not present

## 2022-01-07 DIAGNOSIS — E8809 Other disorders of plasma-protein metabolism, not elsewhere classified: Secondary | ICD-10-CM | POA: Diagnosis present

## 2022-01-07 DIAGNOSIS — K7460 Unspecified cirrhosis of liver: Secondary | ICD-10-CM | POA: Diagnosis not present

## 2022-01-07 DIAGNOSIS — L97529 Non-pressure chronic ulcer of other part of left foot with unspecified severity: Secondary | ICD-10-CM | POA: Diagnosis present

## 2022-01-07 DIAGNOSIS — K7030 Alcoholic cirrhosis of liver without ascites: Secondary | ICD-10-CM | POA: Diagnosis not present

## 2022-01-07 DIAGNOSIS — F1010 Alcohol abuse, uncomplicated: Secondary | ICD-10-CM | POA: Diagnosis present

## 2022-01-07 DIAGNOSIS — R195 Other fecal abnormalities: Secondary | ICD-10-CM | POA: Diagnosis not present

## 2022-01-07 DIAGNOSIS — D638 Anemia in other chronic diseases classified elsewhere: Secondary | ICD-10-CM | POA: Diagnosis present

## 2022-01-07 DIAGNOSIS — G894 Chronic pain syndrome: Secondary | ICD-10-CM | POA: Diagnosis not present

## 2022-01-07 DIAGNOSIS — L03116 Cellulitis of left lower limb: Secondary | ICD-10-CM | POA: Diagnosis present

## 2022-01-07 LAB — HEMOGLOBIN A1C
Hgb A1c MFr Bld: 4.6 % — ABNORMAL LOW (ref 4.8–5.6)
Mean Plasma Glucose: 85.32 mg/dL

## 2022-01-07 LAB — COMPREHENSIVE METABOLIC PANEL
ALT: 44 U/L (ref 0–44)
AST: 77 U/L — ABNORMAL HIGH (ref 15–41)
Albumin: 2.1 g/dL — ABNORMAL LOW (ref 3.5–5.0)
Alkaline Phosphatase: 147 U/L — ABNORMAL HIGH (ref 38–126)
Anion gap: 9 (ref 5–15)
BUN: 18 mg/dL (ref 8–23)
CO2: 28 mmol/L (ref 22–32)
Calcium: 7.6 mg/dL — ABNORMAL LOW (ref 8.9–10.3)
Chloride: 99 mmol/L (ref 98–111)
Creatinine, Ser: 0.99 mg/dL (ref 0.44–1.00)
GFR, Estimated: >60 mL/min (ref >60)
Glucose, Bld: 108 mg/dL — ABNORMAL HIGH (ref 70–99)
Potassium: 3.2 mmol/L — ABNORMAL LOW (ref 3.5–5.1)
Sodium: 136 mmol/L (ref 135–145)
Total Bilirubin: 1 mg/dL (ref 0.3–1.2)
Total Protein: 5.7 g/dL — ABNORMAL LOW (ref 6.5–8.1)

## 2022-01-07 LAB — CBC WITH DIFFERENTIAL/PLATELET
Abs Immature Granulocytes: 0.03 10*3/uL (ref 0.00–0.07)
Basophils Absolute: 0 10*3/uL (ref 0.0–0.1)
Basophils Relative: 0 %
Eosinophils Absolute: 0 10*3/uL (ref 0.0–0.5)
Eosinophils Relative: 0 %
HCT: 41.8 % (ref 36.0–46.0)
Hemoglobin: 13.8 g/dL (ref 12.0–15.0)
Immature Granulocytes: 0 %
Lymphocytes Relative: 15 %
Lymphs Abs: 1.5 10*3/uL (ref 0.7–4.0)
MCH: 30.9 pg (ref 26.0–34.0)
MCHC: 33 g/dL (ref 30.0–36.0)
MCV: 93.7 fL (ref 80.0–100.0)
Monocytes Absolute: 0.3 10*3/uL (ref 0.1–1.0)
Monocytes Relative: 3 %
Neutro Abs: 8.1 10*3/uL — ABNORMAL HIGH (ref 1.7–7.7)
Neutrophils Relative %: 82 %
Platelets: 192 10*3/uL (ref 150–400)
RBC: 4.46 MIL/uL (ref 3.87–5.11)
RDW: 14.2 % (ref 11.5–15.5)
WBC: 10 10*3/uL (ref 4.0–10.5)
nRBC: 0 % (ref 0.0–0.2)

## 2022-01-07 LAB — LACTIC ACID, PLASMA: Lactic Acid, Venous: 2 mmol/L (ref 0.5–1.9)

## 2022-01-07 LAB — MAGNESIUM: Magnesium: 2.1 mg/dL (ref 1.7–2.4)

## 2022-01-07 LAB — C-REACTIVE PROTEIN: CRP: 1.3 mg/dL — ABNORMAL HIGH (ref <1.0)

## 2022-01-07 MED ORDER — FENTANYL CITRATE PF 50 MCG/ML IJ SOSY
50.0000 ug | PREFILLED_SYRINGE | INTRAMUSCULAR | Status: DC | PRN
  Administered 2022-01-07 – 2022-01-09 (×11): 50 ug via INTRAVENOUS
  Filled 2022-01-07 (×11): qty 1

## 2022-01-07 MED ORDER — POTASSIUM CHLORIDE CRYS ER 20 MEQ PO TBCR
40.0000 meq | EXTENDED_RELEASE_TABLET | Freq: Two times a day (BID) | ORAL | Status: DC
  Administered 2022-01-07 – 2022-01-09 (×5): 40 meq via ORAL
  Filled 2022-01-07 (×5): qty 2

## 2022-01-07 MED ORDER — PANTOPRAZOLE SODIUM 40 MG PO TBEC
40.0000 mg | DELAYED_RELEASE_TABLET | Freq: Every day | ORAL | Status: DC
  Administered 2022-01-07 – 2022-01-16 (×10): 40 mg via ORAL
  Filled 2022-01-07 (×10): qty 1

## 2022-01-07 MED ORDER — HYDROMORPHONE HCL 1 MG/ML IJ SOLN
1.0000 mg | Freq: Once | INTRAMUSCULAR | Status: AC
  Administered 2022-01-07: 1 mg via INTRAMUSCULAR
  Filled 2022-01-07: qty 1

## 2022-01-07 MED ORDER — ENOXAPARIN SODIUM 40 MG/0.4ML IJ SOSY
40.0000 mg | PREFILLED_SYRINGE | INTRAMUSCULAR | Status: DC
  Administered 2022-01-07 – 2022-01-16 (×10): 40 mg via SUBCUTANEOUS
  Filled 2022-01-07 (×10): qty 0.4

## 2022-01-07 MED ORDER — SODIUM CHLORIDE 0.9 % IV SOLN: INTRAVENOUS | Status: DC

## 2022-01-07 MED ORDER — SENNOSIDES-DOCUSATE SODIUM 8.6-50 MG PO TABS: 1.0000 | ORAL_TABLET | Freq: Every evening | ORAL | Status: DC | PRN

## 2022-01-07 MED ORDER — DOCUSATE SODIUM 100 MG PO CAPS
100.0000 mg | ORAL_CAPSULE | Freq: Two times a day (BID) | ORAL | Status: DC
  Administered 2022-01-07 – 2022-01-25 (×25): 100 mg via ORAL
  Filled 2022-01-07 (×32): qty 1

## 2022-01-07 MED ORDER — OXYCODONE HCL 5 MG PO TABS
5.0000 mg | ORAL_TABLET | ORAL | Status: DC | PRN
  Administered 2022-01-07 – 2022-01-09 (×5): 5 mg via ORAL
  Filled 2022-01-07 (×5): qty 1

## 2022-01-07 NOTE — ED Notes (Signed)
Called and updated Lysbeth Galas, Therapist, sports at [employer].

## 2022-01-07 NOTE — ED Notes (Signed)
Left IV infiltrated.  Pitting edema noted to left upper arm/

## 2022-01-07 NOTE — H&P (Signed)
History and Physical    Patient: Carolyn [name] RCV:[id number] DOB: Apr 01, 1955 DOA: 01/06/2022 DOS: the patient was seen and examined on 01/07/2022 PCP: London Pepper, MD  Patient coming from: Home  Chief Complaint:  Chief Complaint  Patient presents with   Foot Wound   HPI: LYNDSEY DEMOS is a 66 y.o. female with medical history significant of alcoholic cirrhosis, chronic pancreatitis, chronic pain, GERD. Presenting with a right foot wound. She reports that a few weeks ago she went on a trip where she was wearing poorly fitting boots the whole time. This led to a large blister that opened on her foot and toe. She was trying to take care of the problem at home over the last few weeks by keeping it clean, but the wounds never healed. She didn't have any fever, N/V/D, chills. She became concerned and went to urgent care yesterday. The recommended that she go to the ED for w/u of possible osteomyelitis. She denies any other aggravating or alleviating factors.    Review of Systems: As mentioned in the history of present illness. All other systems reviewed and are negative. Past Medical History:  Diagnosis Date   Alcoholic cirrhosis ([place])    Chronic pancreatitis ([place])    Endometriosis    GERD (gastroesophageal reflux disease)    Liver disease    Malabsorption    MALABSORPTION SYNDROME   Osteoporosis 03/2011   t score -2.5   Pancreatitis    due to cyst and tumors due to calcifications   Seizure ([place])    no seizure disorder   Vitamin D deficiency 2012   VIT D 15   Past Surgical History:  Procedure Laterality Date   APPENDECTOMY     CHOLECYSTECTOMY     FEEDING TUBE RELOCATION  2010   JEJUNOSTOMY FEEDING TUBE  1990   MULTIPLE GASTRIC SURGERIES     MYOMECTOMY     OPEN REDUCTION INTERNAL FIXATION (ORIF) DISTAL RADIAL FRACTURE Left 06/29/2018   Procedure: OPEN REDUCTION INTERNAL FIXATION (ORIF)LEFT  DISTAL RADIAL FRACTURE;  Surgeon: Leanora Cover, MD;  Location: [facility];  Service: Orthopedics;  Laterality: Left;   ROUX-EN-Y GASTRIC BYPASS     TUBAL LIGATION     Social History:  reports that she has never smoked. She has never used smokeless tobacco. She reports current alcohol use. She reports that she does not use drugs.  Allergies  Allergen Reactions   Peanut-Containing Drug Products Anaphylaxis    Tree nuts    Family History  Problem Relation Age of Onset   Cancer Father        BONE   Breast cancer Maternal Grandmother     Prior to Admission medications   Medication Sig Start Date End Date Taking? Authorizing Provider  calcium carbonate (OS-CAL - DOSED IN MG OF ELEMENTAL CALCIUM) 1250 MG tablet Take 1 tablet by mouth daily.    [provider]  famotidine (PEPCID) 10 MG tablet Take 10 mg by mouth 2 (two) times daily.    [provider]  fentaNYL (DURAGESIC - DOSED MCG/HR) 100 MCG/HR Place 1 patch onto the skin every other day.    [provider]  Ferrous Sulfate (IRON PO) Take 1 tablet by mouth daily.    [provider]  Hyoscyamine Sulfate SL (LEVSIN/SL) 0.125 MG SUBL Place 1 each under the tongue 4 (four) times daily as needed for up to 5 days. 09/05/20 09/10/20  Fatima Blank, MD  ibuprofen (ADVIL,MOTRIN) 200 MG tablet Take  600 mg by mouth every 6 (six) hours as needed for moderate pain.    [provider]  lidocaine (XYLOCAINE) 2 % solution Use as directed 15 mLs in the mouth or throat every 6 (six) hours as needed for mouth pain. 09/05/20   Nira Conn, MD  lipase/protease/amylase (CREON) 12000 units CPEP capsule Take 2 capsules (24,000 Units total) by mouth 3 (three) times daily with meals. 08/24/18   Tressia Danas, MD  Multiple Vitamin (MULITIVITAMIN WITH MINERALS) TABS Take 1 tablet by mouth daily.    [provider]  ondansetron (ZOFRAN) 4 MG tablet Take 4 mg by mouth every 8 (eight) hours as needed for nausea or vomiting.    [provider]  traZODone  (DESYREL) 50 MG tablet Take 50 mg by mouth at bedtime.    [provider]  vitamin B-12 (CYANOCOBALAMIN) 100 MCG tablet Take 100 mcg by mouth daily.    [provider]  vitamin C (ASCORBIC ACID) 250 MG tablet Take 250 mg by mouth daily.    [provider]  PROMETHAZINE HCL PO Take 4 mg by mouth.   06/06/11  [provider]  Ranitidine HCl (ZANTAC PO) Take by mouth.    06/06/11  [provider]    Physical Exam: Vitals:   01/07/22 0600 01/07/22 0700 01/07/22 0730 01/07/22 0746  BP: 124/79 128/77 121/73   Pulse: 95 95 (!) 101   Resp: 14 11 17    Temp: 98.1 F (36.7 C)   97.8 F (36.6 C)  TempSrc: Oral     SpO2: 97% 99% 100%   Weight:      Height:       General: 66 y.o. female resting in bed in NAD Eyes: PERRL, normal sclera ENMT: Nares patent w/o discharge, orophaynx clear, dentition normal, ears w/o discharge/lesions/ulcers Neck: Supple, trachea midline Cardiovascular: RRR, +S1, S2, no m/g/r, equal pulses throughout Respiratory: CTABL, no w/r/r, normal WOB GI: BS+, NDNT, no masses noted, no organomegaly noted MSK: No c/c; edema of hands, erythema and edema of right foot w/ open wound of distal 2nd digit Neuro: A&O x 3, no focal deficits Psyc: Appropriate interaction and affect, calm/cooperative  Data Reviewed:  Results for orders placed or performed during the hospital encounter of 01/06/22 (from the past 24 hour(s))  Basic metabolic panel     Status: Abnormal   Collection Time: 01/06/22  8:25 PM  Result Value Ref Range   Sodium 134 (L) 135 - 145 mmol/L   Potassium 2.1 (LL) 3.5 - 5.1 mmol/L   Chloride 96 (L) 98 - 111 mmol/L   CO2 28 22 - 32 mmol/L   Glucose, Bld 127 (H) 70 - 99 mg/dL   BUN 23 8 - 23 mg/dL   Creatinine, Ser 01/08/22 (H) 0.44 - 1.00 mg/dL   Calcium 7.2 (L) 8.9 - 10.3 mg/dL   GFR, Estimated 52 (L) >60 mL/min   Anion gap 10 5 - 15  CBC with Differential     Status: Abnormal   Collection Time: 01/06/22  8:25 PM  Result  Value Ref Range   WBC 10.3 4.0 - 10.5 K/uL   RBC 3.52 (L) 3.87 - 5.11 MIL/uL   Hemoglobin 10.9 (L) 12.0 - 15.0 g/dL   HCT 01/08/22 (L) 29.9 - 24.2 %   MCV 92.3 80.0 - 100.0 fL   MCH 31.0 26.0 - 34.0 pg   MCHC 33.5 30.0 - 36.0 g/dL   RDW 68.3 41.9 - 62.2 %   Platelets 180  150 - 400 K/uL   nRBC 0.0 0.0 - 0.2 %   Neutrophils Relative % 79 %   Neutro Abs 8.1 (H) 1.7 - 7.7 K/uL   Lymphocytes Relative 17 %   Lymphs Abs 1.8 0.7 - 4.0 K/uL   Monocytes Relative 4 %   Monocytes Absolute 0.4 0.1 - 1.0 K/uL   Eosinophils Relative 0 %   Eosinophils Absolute 0.0 0.0 - 0.5 K/uL   Basophils Relative 0 %   Basophils Absolute 0.0 0.0 - 0.1 K/uL   Immature Granulocytes 0 %   Abs Immature Granulocytes 0.03 0.00 - 0.07 K/uL  Hemoglobin A1c     Status: Abnormal   Collection Time: 01/06/22  8:25 PM  Result Value Ref Range   Hgb A1c MFr Bld 4.6 (L) 4.8 - 5.6 %   Mean Plasma Glucose 85.32 mg/dL  Sedimentation rate     Status: None   Collection Time: 01/06/22  8:25 PM  Result Value Ref Range   Sed Rate 15 0 - 22 mm/hr  C-reactive protein     Status: Abnormal   Collection Time: 01/06/22  8:25 PM  Result Value Ref Range   CRP 1.3 (H) <1.0 mg/dL  Culture, blood (routine x 2)     Status: None (Preliminary result)   Collection Time: 01/06/22  8:25 PM   Specimen: BLOOD  Result Value Ref Range   Specimen Description      BLOOD RIGHT ANTECUBITAL Performed at [facility], [address] [place], [place] [address]    Special Requests      BOTTLES DRAWN AEROBIC AND ANAEROBIC Blood Culture adequate volume Performed at [facility], [address] [place], [place] [address]    Culture      NO GROWTH < 12 HOURS Performed at [facility], [address] [place], [place] [address]    Report Status PENDING   Lactic acid, plasma     Status: None   Collection Time: 01/06/22  8:25 PM  Result Value Ref Range   Lactic Acid, Venous 1.7 0.5 - 1.9 mmol/L  Magnesium     Status:  Abnormal   Collection Time: 01/06/22  9:42 PM  Result Value Ref Range   Magnesium 1.5 (L) 1.7 - 2.4 mg/dL   XR Right Foot Moderate distal second toe soft tissue swelling. Mild erosion of the distal, minimally medial aspect of the distal phalanx of the second toe concerning for acute osteomyelitis.  Assessment and Plan: Osteomyelitis of right foot, second toe Cellulitis of right foot     - admitted to inpt, tele     - continue cefepime, flagyl; poor access, order PICC     - check MRI     - spoke with podiatry (Dr. Annamary Rummage); he will see her, appreciate assistance  Hypokalemia Hypomagnesemia     - replace K+, Mg2+; trend  AKI     - watch nephrotoxins     - fluids     - check renal US  Normocytic anemia     - at baseline, no evidence of bleed, follow  GERD     - PPI  Chronic pain     - continue home regimen when confirmed  Chronic pancreatitis     - continue home regimen when confirmed  Severe protein-calorie malnutrition     - dietitian consult  Advance Care Planning:   Code Status: FULL  Consults: Podiatry  Family Communication: None at bedside.  Severity of  Illness: The appropriate patient status for this patient is INPATIENT. Inpatient status is judged to be reasonable and necessary in order to provide the required intensity of service to ensure the patient's safety. The patient's presenting symptoms, physical exam findings, and initial radiographic and laboratory data in the context of their chronic comorbidities is felt to place them at high risk for further clinical deterioration. Furthermore, it is not anticipated that the patient will be medically stable for discharge from the hospital within 2 midnights of admission.   * I certify that at the point of admission it is my clinical judgment that the patient will require inpatient hospital care spanning beyond 2 midnights from the point of admission due to high intensity of service, high risk for further  deterioration and high frequency of surveillance required.*  Author: Teddy Spike, DO 01/07/2022 8:48 AM  For on call review [email or web address].

## 2022-01-07 NOTE — ED Notes (Signed)
Unable to draw blood at this time. Patient stuck multiple times for access. Patent IV unable to draw back. Provider aware. No new orders.

## 2022-01-07 NOTE — ED Notes (Signed)
Patient transported to MRI 

## 2022-01-07 NOTE — Progress Notes (Signed)
ED RN aware PICC insertion will not be done tonight.

## 2022-01-07 NOTE — ED Notes (Signed)
Patient asked again about K runs patient still refuses them. Attempted to get repeat labs and unable to obtain

## 2022-01-07 NOTE — Consult Note (Addendum)
PODIATRY CONSULTATION  NAME Carolyn [name] MRN [id number] DOB 03-02-1956 DOA 01/06/2022   Reason for consult:  Chief Complaint  Patient presents with   Foot Wound    Attending/Consulting physician: Cherylann Ratel DO  History of present illness: 66 y.o. female  with medical history significant of alcoholic cirrhosis, chronic pancreatitis, chronic pain, GERD. Presenting with a right foot wound. She reports that a few weeks ago she went on a trip where she was wearing poorly fitting boots the whole time. This led to a large blister that opened on her foot and toe. She was trying to take care of the problem at home over the last few weeks by keeping it clean, but the wounds never healed. She didn't have any fever, N/V/D, chills. She became concerned and went to urgent care yesterday. The recommended that she go to the ED for w/u of possible osteomyelitis. Her husband is with her bedside. Said the primary concern was initially this blister on the right plantar arch. Only became aware of the right 2nd toe ulcer more recently. Also noted wound to the left 2nd toe. Pt states the wounds esp the r 2nd toe wound has been a problem for a while related to the toe deformity.   She is in significant pain at this time over the bilateral lower extremity and appears malnourished/ frail. Husband attributes this to pancreatitis.   Past Medical History:  Diagnosis Date   Alcoholic cirrhosis ([place])    Chronic pancreatitis ([place])    Endometriosis    GERD (gastroesophageal reflux disease)    Liver disease    Malabsorption    MALABSORPTION SYNDROME   Osteoporosis 03/2011   t score -2.5   Pancreatitis    due to cyst and tumors due to calcifications   Seizure ([place])    no seizure disorder   Vitamin D deficiency 2012   VIT D 15       Latest Ref Rng & Units 01/07/2022   11:54 AM 01/06/2022    8:25 PM 09/04/2020    9:24 PM  CBC  WBC 4.0 - 10.5 K/uL 10.0  10.3  4.7   Hemoglobin 12.0 - 15.0 g/dL 13.8  10.9   12.7   Hematocrit 36.0 - 46.0 % 41.8  32.5  38.2   Platelets 150 - 400 K/uL 192  180  87        Latest Ref Rng & Units 01/07/2022   11:55 AM 01/06/2022    8:25 PM 09/04/2020    9:24 PM  BMP  Glucose 70 - 99 mg/dL 108  127  124   BUN 8 - 23 mg/dL 18  23  13    Creatinine 0.44 - 1.00 mg/dL 0.99  1.18  0.92   Sodium 135 - 145 mmol/L 136  134  139   Potassium 3.5 - 5.1 mmol/L 3.2  2.1  3.3   Chloride 98 - 111 mmol/L 99  96  100   CO2 22 - 32 mmol/L 28  28  24    Calcium 8.9 - 10.3 mg/dL 7.6  7.2  8.4       Physical Exam: Lower Extremity Exam Vasc: R - PT palpable, DP palpable. Cap refill < 3 sec to digits   L - PT palpable, DP palpable. Cap refill <3 sec to digits  Derm: R - Eschar 1.5 cm diamteter with underlying pressure ulceration at the distal tuft of the 2nd digit. Unable to probe to bone but based on location size and  depth appears to abut the distal phalanx. Edema and erythema of the right 2nd toe. Raw erythematous skin present plantar medial arch with no deep wound but some fibrotic tissue present.   L - Eschar present at the distal tuft of Left 2nd toe. Smaller than right foot 0.5 cm diameter. Unable to assess dept of underlying ulcer. Mild edema and erythema left 2nd toe. Left medial ankle ulceration to level of subcuanteus fat with fibrotic slough.   MSK:  R - Tender to palpation globally. Rigid 2nd hammertoe, hallux mallet toe deformity  L - Tender to palpation, rigid 2nd hammertoe deformity  Neuro: R - Gross sensation intact. Gross motor function intact   L - Gross sensation intact. Gross motor function intact           ASSESSMENT/PLAN OF CARE 66 y.o. female with PMHx significant for  lcoholic cirrhosis, chronic pancreatitis, chronic pain, GERD  with distal right 2nd toe ulceration with concern clinically and on XR for possible osteomyelitis of the distal phalanx. Also with Left 2nd toe distal ulceration, left medial ankle wound, right plantar arch ulceration.    AF, Tachycardia  WBC 10 ESR/CRP: 15/1.3  XR R Foot Concern for erosions at the distal phalanx 2nd toe possible OM, edema  MRI R foot 1. Marrow edema of the distal phalanx 1st toe, possible abnormal stress/contusion 2. No evidence OM R 2nd toe  Edema R foot, no abscess  - NPO at 5 AM tomorrow for possible OR tmrw afternoon for R 2nd toe amputation. Pt is overall unlikely to heal R 2nd toe ulcer given digital deformity depth and possibility of underlying OM despite negative MRI.  - Will order XR left foot to assess for OM L 2nd toe distal phalanx. If concern, may need MRI left foot.  - Do not feel there is clinical concern for OM of the Right hallux, favor that to be due to mallet toe deformity/stress - Continue IV abx broad spectrum pending further culture data - Anticoagulation: per primary - Wound care: Non adherent gauze dressing to right plantar arch change daily. Betadine ointment to right 2nd toe ulceration, left 2nd toe ulceration, left medial ankle ulceration.  - WB status: WBAT to BLE - Will continue to follow   Thank you for the consult.  Please contact me directly with any questions or concerns.           Everitt Amber, DPM [facility]    [address], [place] [address]                Office [phone]  Fax [phone]

## 2022-01-07 NOTE — ED Notes (Signed)
Per PM RN reports patient refused K runs due to it burning.  Reported that attempted to increase the dilution rate and slow the K run and patient still refused K runs.

## 2022-01-08 ENCOUNTER — Encounter (HOSPITAL_COMMUNITY): Payer: Self-pay | Admitting: Certified Registered Nurse Anesthetist

## 2022-01-08 ENCOUNTER — Encounter (HOSPITAL_COMMUNITY)
Admission: EM | Disposition: A | Discharge status: Skilled Nursing Facility | Payer: Self-pay | Source: Home / Self Care | Attending: Internal Medicine

## 2022-01-08 ENCOUNTER — Observation Stay: Payer: Self-pay

## 2022-01-08 ENCOUNTER — Observation Stay (HOSPITAL_COMMUNITY): Payer: Medicare PPO

## 2022-01-08 DIAGNOSIS — G894 Chronic pain syndrome: Secondary | ICD-10-CM | POA: Diagnosis not present

## 2022-01-08 DIAGNOSIS — K219 Gastro-esophageal reflux disease without esophagitis: Secondary | ICD-10-CM

## 2022-01-08 DIAGNOSIS — N179 Acute kidney failure, unspecified: Secondary | ICD-10-CM | POA: Diagnosis not present

## 2022-01-08 DIAGNOSIS — K860 Alcohol-induced chronic pancreatitis: Secondary | ICD-10-CM

## 2022-01-08 DIAGNOSIS — L0390 Cellulitis, unspecified: Secondary | ICD-10-CM

## 2022-01-08 DIAGNOSIS — L03115 Cellulitis of right lower limb: Secondary | ICD-10-CM

## 2022-01-08 DIAGNOSIS — E43 Unspecified severe protein-calorie malnutrition: Secondary | ICD-10-CM

## 2022-01-08 DIAGNOSIS — M2061 Acquired deformities of toe(s), unspecified, right foot: Secondary | ICD-10-CM

## 2022-01-08 DIAGNOSIS — M2062 Acquired deformities of toe(s), unspecified, left foot: Secondary | ICD-10-CM

## 2022-01-08 DIAGNOSIS — L89151 Pressure ulcer of sacral region, stage 1: Secondary | ICD-10-CM

## 2022-01-08 DIAGNOSIS — E876 Hypokalemia: Secondary | ICD-10-CM

## 2022-01-08 DIAGNOSIS — L8990 Pressure ulcer of unspecified site, unspecified stage: Secondary | ICD-10-CM | POA: Insufficient documentation

## 2022-01-08 LAB — MAGNESIUM: Magnesium: 1.6 mg/dL — ABNORMAL LOW (ref 1.7–2.4)

## 2022-01-08 LAB — COMPREHENSIVE METABOLIC PANEL
ALT: 32 U/L (ref 0–44)
AST: 48 U/L — ABNORMAL HIGH (ref 15–41)
Albumin: 1.7 g/dL — ABNORMAL LOW (ref 3.5–5.0)
Alkaline Phosphatase: 130 U/L — ABNORMAL HIGH (ref 38–126)
Anion gap: 4 — ABNORMAL LOW (ref 5–15)
BUN: 13 mg/dL (ref 8–23)
CO2: 25 mmol/L (ref 22–32)
Calcium: 7.1 mg/dL — ABNORMAL LOW (ref 8.9–10.3)
Chloride: 106 mmol/L (ref 98–111)
Creatinine, Ser: 0.89 mg/dL (ref 0.44–1.00)
GFR, Estimated: >60 mL/min (ref >60)
Glucose, Bld: 154 mg/dL — ABNORMAL HIGH (ref 70–99)
Potassium: 3.5 mmol/L (ref 3.5–5.1)
Sodium: 135 mmol/L (ref 135–145)
Total Bilirubin: 1 mg/dL (ref 0.3–1.2)
Total Protein: 4.8 g/dL — ABNORMAL LOW (ref 6.5–8.1)

## 2022-01-08 LAB — LACTIC ACID, PLASMA
Lactic Acid, Venous: 1.6 mmol/L (ref 0.5–1.9)
Lactic Acid, Venous: 2 mmol/L (ref 0.5–1.9)

## 2022-01-08 LAB — CK: Total CK: 64 U/L (ref 38–234)

## 2022-01-08 SURGERY — AMPUTATION, TOE
Anesthesia: Monitor Anesthesia Care

## 2022-01-08 MED ORDER — TIZANIDINE HCL 4 MG PO TABS
2.0000 mg | ORAL_TABLET | Freq: Three times a day (TID) | ORAL | Status: DC | PRN
  Administered 2022-01-10 – 2022-01-14 (×7): 2 mg via ORAL
  Filled 2022-01-08 (×7): qty 1

## 2022-01-08 MED ORDER — ADULT MULTIVITAMIN W/MINERALS CH
1.0000 | ORAL_TABLET | Freq: Every day | ORAL | Status: DC
  Administered 2022-01-08 – 2022-01-25 (×17): 1 via ORAL
  Filled 2022-01-08 (×19): qty 1

## 2022-01-08 MED ORDER — NORTRIPTYLINE HCL 10 MG PO CAPS
30.0000 mg | ORAL_CAPSULE | Freq: Every day | ORAL | Status: DC
  Administered 2022-01-08 – 2022-01-24 (×17): 30 mg via ORAL
  Filled 2022-01-08 (×5): qty 3
  Filled 2022-01-08: qty 2
  Filled 2022-01-08 (×12): qty 3

## 2022-01-08 MED ORDER — ENSURE ENLIVE PO LIQD
237.0000 mL | Freq: Three times a day (TID) | ORAL | Status: DC
  Administered 2022-01-08 – 2022-01-24 (×37): 237 mL via ORAL

## 2022-01-08 MED ORDER — BUPRENORPHINE 10 MCG/HR TD PTWK: 2.0000 | MEDICATED_PATCH | TRANSDERMAL | Status: DC

## 2022-01-08 MED ORDER — ZINC SULFATE 220 (50 ZN) MG PO CAPS
220.0000 mg | ORAL_CAPSULE | Freq: Every day | ORAL | Status: AC
  Administered 2022-01-08 – 2022-01-21 (×14): 220 mg via ORAL
  Filled 2022-01-08 (×14): qty 1

## 2022-01-08 MED ORDER — VITAMIN C 500 MG PO TABS
500.0000 mg | ORAL_TABLET | Freq: Two times a day (BID) | ORAL | Status: DC
  Administered 2022-01-08 – 2022-01-25 (×34): 500 mg via ORAL
  Filled 2022-01-08 (×35): qty 1

## 2022-01-08 MED ORDER — PANCRELIPASE (LIP-PROT-AMYL) 12000-38000 UNITS PO CPEP
24000.0000 [IU] | ORAL_CAPSULE | Freq: Three times a day (TID) | ORAL | Status: DC
  Administered 2022-01-08 – 2022-01-25 (×42): 24000 [IU] via ORAL
  Filled 2022-01-08 (×42): qty 2

## 2022-01-08 SURGICAL SUPPLY — 28 items
APL PRP STRL LF DISP 70% ISPRP (MISCELLANEOUS) ×1
BAG SPEC THK2 15X12 ZIP CLS (MISCELLANEOUS) ×1
BAG ZIPLOCK 12X15 (MISCELLANEOUS) ×2 IMPLANT
BANDAGE ESMARK 6X9 LF (GAUZE/BANDAGES/DRESSINGS) ×2 IMPLANT
BNDG CMPR 5X4 CHSV STRCH STRL (GAUZE/BANDAGES/DRESSINGS) ×1
BNDG CMPR 75X21 PLY HI ABS (MISCELLANEOUS) ×1
BNDG CMPR 9X6 STRL LF SNTH (GAUZE/BANDAGES/DRESSINGS) ×1
BNDG COHESIVE 4X5 TAN STRL LF (GAUZE/BANDAGES/DRESSINGS) ×2 IMPLANT
BNDG ESMARK 6X9 LF (GAUZE/BANDAGES/DRESSINGS) ×1
CHLORAPREP W/TINT 26 (MISCELLANEOUS) ×2 IMPLANT
COVER SURGICAL LIGHT HANDLE (MISCELLANEOUS) ×2 IMPLANT
CUFF TOURN SGL QUICK 18X4 (TOURNIQUET CUFF) ×2 IMPLANT
GAUZE SPONGE 4X4 12PLY STRL (GAUZE/BANDAGES/DRESSINGS) ×2 IMPLANT
GAUZE STRETCH 2X75IN STRL (MISCELLANEOUS) ×2 IMPLANT
GAUZE XEROFORM 1X8 LF (GAUZE/BANDAGES/DRESSINGS) ×2 IMPLANT
GLOVE BIOGEL PI IND STRL 8 (GLOVE) ×2 IMPLANT
GLOVE ECLIPSE 8.0 STRL XLNG CF (GLOVE) ×2 IMPLANT
GOWN STRL REUS W/ TWL LRG LVL3 (GOWN DISPOSABLE) ×2 IMPLANT
GOWN STRL REUS W/TWL LRG LVL3 (GOWN DISPOSABLE) ×1
KIT BASIN OR (CUSTOM PROCEDURE TRAY) ×2 IMPLANT
NDL HYPO 25X1 1.5 SAFETY (NEEDLE) ×1 IMPLANT
NEEDLE HYPO 25X1 1.5 SAFETY (NEEDLE) ×1 IMPLANT
NS IRRIG 1000ML POUR BTL (IV SOLUTION) ×2 IMPLANT
PACK ORTHO EXTREMITY (CUSTOM PROCEDURE TRAY) ×2 IMPLANT
PENCIL SMOKE EVACUATOR (MISCELLANEOUS) IMPLANT
PROTECTOR NERVE ULNAR (MISCELLANEOUS) ×2 IMPLANT
SUT ETHILON 4 0 PS 2 18 (SUTURE) ×4 IMPLANT
TOWEL OR 17X26 10 PK STRL BLUE (TOWEL DISPOSABLE) ×2 IMPLANT

## 2022-01-08 NOTE — Progress Notes (Signed)
ABI study completed.  ° °Please see CV Proc for preliminary results.  ° °Carolyn [name], RDMS, RVT ° °

## 2022-01-08 NOTE — Progress Notes (Signed)
Lab called RN about pt's lactic acid value is 2.0, . MD notified.

## 2022-01-08 NOTE — Progress Notes (Signed)
CCMD called, pt have 10 beats SVT at [facility]. RN will continue monitor the pt.

## 2022-01-08 NOTE — Progress Notes (Signed)
Per secure chat with the provider Wendee Beavers MD, PICC not needed at this time. Patient has working PIV. PICC order discontinued.

## 2022-01-08 NOTE — Progress Notes (Signed)
PROGRESS NOTE  Carolyn [name]:[id number] DOB: 1955-08-08   PCP: London Pepper, MD  Patient is from: Home  DOA: 01/06/2022 LOS: 1  Chief complaints Chief Complaint  Patient presents with   Foot Wound     Brief Narrative / Interim history: 66 year old F with PMH of chronic alcoholic pancreatitis with chronic pain and pancreatic insufficiency, GERD, severe malnutrition and osteoporosis presenting with right foot wound.  Reports going on a trip where she was wearing poorly fitted boots the whole time and developed blister over the plantar aspect of right foot few weeks ago.  She also noted some wounds on the plantar aspect of both second toes.  She has long and hammertoe.  She tried to manage by keeping it clean but wound never healed which prompted her to go to urgent care, and she was sent to ED.  She had no constitutional symptoms.   In ED, slightly tachycardic.  Hypokalemic.  Lactic acid 2.0.  WBC 10.3 with mild left shift. Cr 1.18.  BUN 23.  Calcium 7.2.  Right foot x-ray showed moderate distal second toe soft tissue swelling, and mild erosion of the distal phalanx of second toe concerning for acute osteomyelitis.  MRI right foot with focal marrow edema of the distal phalanx of the first digit raising concern for bone contusion or abnormal distress, no evidence of OM in the second and generalized edema but the plantar muscle and tendon and subcutaneous soft tissue edema about the dorsum of the foot without evidence of fluid collection or abscess.  Patient was started on broad-spectrum antibiotics.  Podiatry consulted.     Subjective: Seen and examined earlier this morning.  No major events overnight of this morning.  She complains significant pain in her feet.  She rates her pain 8/10 in right foot.  She also reports chronic pancreatic pain.  She is asking for her Butrans.  She has IV fentanyl as needed.   Objective: Vitals:   01/07/22 2144 01/08/22 0151 01/08/22 0609 01/08/22 1338   BP: (!) 138/92 132/80 126/76 133/77  Pulse: (!) 125 (!) 115 (!) 107 (!) 104  Resp: _0 Temp: 98.7 F (37.1 C) 98.3 F (36.8 C) 98.8 F (37.1 C) 98.7 F (37.1 C)  TempSrc: Oral Oral Oral Oral  SpO2: 97% 95% 94% 98%  Weight:      Height:        Examination:  GENERAL: Appears frail.  No distress. HEENT: MMM.  Vision and hearing grossly intact.  NECK: Supple.  No apparent JVD.  RESP:  No IWOB.  Fair aeration bilaterally. CVS:  RRR. Heart sounds normal.  ABD/GI/GU: BS+. Abd soft, NTND.  MSK/EXT: See if can muscle mass and subcu fat loss. SKIN: Broken bulla over the plantar aspect of left foot.  Scabbed wound over plantar aspect of bilateral second toes.  Also small scattered scabbed wounds over dorsal aspect of both feet.  Significant muscle mass and subcu fat loss. NEURO: Awake, alert and oriented appropriately.  No apparent focal neuro deficit. PSYCH: Calm. Normal affect.     Procedures:  None  Microbiology summarized: Blood cultures NGTD  Assessment and plan: Principal Problem:   Osteomyelitis ([place]) Active Problems:   Chronic pancreatitis ([facility])   Hypokalemia   Hypomagnesemia   AKI (acute kidney injury) ([place])   Protein-calorie malnutrition, severe ([facility])   Chronic pain   Cellulitis of right foot   Normocytic anemia   GERD (gastroesophageal reflux disease)   Pressure injury of skin  Bilateral feet cellulitis/ulceration Bilateral second toe deformity -Right foot x-ray raise concern for cellulitis and osteomyelitis but negative on MRI -Left foot x-ray suspicious for cellulitis but no osteo -no leukocytosis.  CRP 1.3.  ESR 15. -Patient is not diabetic.  Not a smoker. -continue broad-spectrum antibiotics for now -Podiatry on board for surgical intervention. -Pain control  AKI: Resolving. Recent Labs    01/06/22 2025 01/07/22 1155  BUN 23 18  CREATININE 1.18* 0.99  -Gentle IV fluid  Hypokalemia/hypomagnesemia/hyponatremia: Improved. -Monitor  replenish as appropriate   Chronic alcoholic pancreatitis/pancreatic insufficiency/chronic pain: Sober for over a year. -Pain control-resume home Butrans.  Continue fentanyl as needed -Continue home Creon -Consult dietitian   Normocytic anemia: Diluted sample? Recent Labs    01/06/22 2025 01/07/22 1154  HGB 10.9* 13.8   GERD  - PPI  Poor venous access: Phlebotomy not able to obtain samples for morning labs due to poor venous access -PICC line requested  Severe protein caloric malnutrition Body mass index is 16.73 kg/m. Nutrition Problem: Severe Malnutrition Etiology: chronic illness (chronic pancreatitis, h/o multiple gastric surgeries including gastric bypass) Signs/Symptoms: severe fat depletion, severe muscle depletion, energy intake < or equal to 75% for > or equal to 1 month Interventions: Ensure Enlive (each supplement provides 350kcal and 20 grams of protein), MVI, Education  Pressure skin injury: POA Pressure Injury 01/07/22 Coccyx Mid Stage 1 -  Intact skin with non-blanchable redness of a localized area usually over a bony prominence. (Active)  01/07/22 1848  Location: Coccyx  Location Orientation: Mid  Staging: Stage 1 -  Intact skin with non-blanchable redness of a localized area usually over a bony prominence.  Wound Description (Comments):   Present on Admission: Yes  Dressing Type Foam - Lift dressing to assess site every shift 01/08/22 0830   DVT prophylaxis:  enoxaparin (LOVENOX) injection 40 mg Start: 01/07/22 2000  Code Status: Full code Family Communication: None at bedside Level of care: Telemetry Status is: Inpatient Remains inpatient appropriate because: Due to bilateral foot infection requiring IV antibiotics and possible surgical intervention   Final disposition: TB Consultants:  Podiatry  Sch Meds:  Scheduled Meds:  ascorbic acid  500 mg Oral BID   [START ON 01/09/2022] buprenorphine  2 patch Transdermal Weekly   docusate sodium  100  mg Oral BID   enoxaparin (LOVENOX) injection  40 mg Subcutaneous Q24H   feeding supplement  237 mL Oral TID BM   lipase/protease/amylase  24,000 Units Oral TID WC   multivitamin with minerals  1 tablet Oral Daily   nortriptyline  30 mg Oral QHS   pantoprazole  40 mg Oral Daily   potassium chloride  40 mEq Oral BID   zinc sulfate  220 mg Oral Daily   Continuous Infusions:  sodium chloride 75 mL/hr at 01/07/22 1832   cefTRIAXone (ROCEPHIN)  IV 2 g (01/07/22 1957)   And   metronidazole 500 mg (01/08/22 0830)   PRN Meds:.fentaNYL (SUBLIMAZE) injection, ondansetron (ZOFRAN) IV, oxyCODONE, senna-docusate, tiZANidine  Antimicrobials: Anti-infectives (From admission, onward)    Start     Dose/Rate Route Frequency Ordered Stop   01/06/22 2030  cefTRIAXone (ROCEPHIN) 2 g in sodium chloride 0.9 % 100 mL IVPB       See Hyperspace for full Linked Orders Report.   2 g 200 mL/hr over 30 Minutes Intravenous Every 24 hours 01/06/22 2017 01/13/22 2029   01/06/22 2030  metroNIDAZOLE (FLAGYL) IVPB 500 mg       See Hyperspace for full Linked  Orders Report.   500 mg 100 mL/hr over 60 Minutes Intravenous Every 12 hours 01/06/22 2017 01/13/22 2029        I have personally reviewed the following labs and images: CBC: Recent Labs  Lab 01/06/22 2025 01/07/22 1154  WBC 10.3 10.0  NEUTROABS 8.1* 8.1*  HGB 10.9* 13.8  HCT 32.5* 41.8  MCV 92.3 93.7  PLT 180 192   BMP &GFR Recent Labs  Lab 01/06/22 2025 01/06/22 2142 01/07/22 1155  NA 134*  --  136  K 2.1*  --  3.2*  CL 96*  --  99  CO2 28  --  28  GLUCOSE 127*  --  108*  BUN 23  --  18  CREATININE 1.18*  --  0.99  CALCIUM 7.2*  --  7.6*  MG  --  1.5* 2.1   Estimated Creatinine Clearance: 45.2 mL/min (by C-G formula based on SCr of 0.99 mg/dL). Liver & Pancreas: Recent Labs  Lab 01/07/22 1155  AST 77*  ALT 44  ALKPHOS 147*  BILITOT 1.0  PROT 5.7*  ALBUMIN 2.1*   No results for input(s): "LIPASE", "AMYLASE" in the last 168  hours. No results for input(s): "AMMONIA" in the last 168 hours. Diabetic: Recent Labs    01/06/22 2025  HGBA1C 4.6*   No results for input(s): "GLUCAP" in the last 168 hours. Cardiac Enzymes: No results for input(s): "CKTOTAL", "CKMB", "CKMBINDEX", "TROPONINI" in the last 168 hours. No results for input(s): "PROBNP" in the last 8760 hours. Coagulation Profile: No results for input(s): "INR", "PROTIME" in the last 168 hours. Thyroid Function Tests: No results for input(s): "TSH", "T4TOTAL", "FREET4", "T3FREE", "THYROIDAB" in the last 72 hours. Lipid Profile: No results for input(s): "CHOL", "HDL", "LDLCALC", "TRIG", "CHOLHDL", "LDLDIRECT" in the last 72 hours. Anemia Panel: No results for input(s): "VITAMINB12", "FOLATE", "FERRITIN", "TIBC", "IRON", "RETICCTPCT" in the last 72 hours. Urine analysis:    Component Value Date/Time   COLORURINE YELLOW 09/30/2017 [place] 09/30/2017 1449   LABSPEC <1.005 (L) 09/30/2017 1449   PHURINE 6.5 09/30/2017 1449   GLUCOSEU NEGATIVE 09/30/2017 1449   HGBUR NEGATIVE 09/30/2017 1449   BILIRUBINUR NEGATIVE 09/30/2017 1449   KETONESUR NEGATIVE 09/30/2017 1449   PROTEINUR NEGATIVE 09/30/2017 1449   UROBILINOGEN 4.0 (H) 06/07/2011 0007   NITRITE NEGATIVE 09/30/2017 1449   LEUKOCYTESUR TRACE (A) 09/30/2017 1449   Sepsis Labs: Invalid input(s): "PROCALCITONIN", "LACTICIDVEN"  Microbiology: Recent Results (from the past 240 hour(s))  Culture, blood (routine x 2)     Status: None (Preliminary result)   Collection Time: 01/06/22  8:25 PM   Specimen: BLOOD  Result Value Ref Range Status   Specimen Description   Final    BLOOD RIGHT ANTECUBITAL Performed at [facility], [address] [place], [place] [address]    Special Requests   Final    BOTTLES DRAWN AEROBIC AND ANAEROBIC Blood Culture adequate volume Performed at [facility], [address] [place], [place] [address]    Culture   Final    NO  GROWTH 2 DAYS Performed at [facility], [place] [address], [place], [place] [address]    Report Status PENDING  Incomplete  Culture, blood (routine x 2)     Status: None (Preliminary result)   Collection Time: 01/07/22  6:51 PM   Specimen: BLOOD  Result Value Ref Range Status   Specimen Description   Final    BLOOD SITE NOT SPECIFIED Performed  at [facility], [place] [address], [place], [place] [address]    Special Requests   Final    BOTTLES DRAWN AEROBIC ONLY Blood Culture adequate volume Performed at [facility] [address], [place], [place] [address]    Culture   Final    NO GROWTH < 12 HOURS Performed at [facility] [address] [place], [place] [address]    Report Status PENDING  Incomplete  Culture, blood (Routine X 2) w Reflex to ID Panel     Status: None (Preliminary result)   Collection Time: 01/07/22  6:51 PM   Specimen: BLOOD  Result Value Ref Range Status   Specimen Description   Final    BLOOD SITE NOT SPECIFIED Performed at [facility] [address], [place], [place] [address]    Special Requests   Final    BOTTLES DRAWN AEROBIC ONLY Blood Culture adequate volume Performed at [facility] [address] [place], [place] [address]    Culture   Final    NO GROWTH < 12 HOURS Performed at [facility] [address] [place],  [address]    Report Status PENDING  Incomplete    Radiology Studies: [place] EKG SITE RITE  Result Date: 01/08/2022 If Site Rite image not attached, placement could not be confirmed due to current cardiac rhythm.  US RENAL  Result Date: 01/07/2022 CLINICAL DATA:  AKI. EXAM: RENAL / URINARY TRACT ULTRASOUND COMPLETE COMPARISON:  05/21/2013, 11/03/2012. FINDINGS: Right Kidney: Renal measurements: 8.9 x 4.0 x 5.8 cm = volume: 109.0 mL. Echogenicity within normal limits. The renal pelvis is distended. Left Kidney: Renal  measurements: 9.9 x 4.5 x 4.6 cm = volume: 106.8 mL. Echogenicity within normal limits. The renal pelvis is distended. Bladder: Appears normal for degree of bladder distention. Other: The liver has a slightly nodular contour.  Mild-to-moderate ascites. IMPRESSION: 1. Mildly distended renal pelvises bilaterally, which may represent extrarenal pelvises or mild hydronephrosis. 2. Mild-to-moderate ascites. 3. Nodular contour of the liver, possible underlying cirrhosis. Electronically Signed   By: Brett Fairy M.D.   On: 01/07/2022 21:13   DG Foot Complete Left  Result Date: 01/07/2022 CLINICAL DATA:  Open toe wound. EXAM: LEFT FOOT - COMPLETE 3+ VIEW COMPARISON:  None Available. FINDINGS: No acute fracture or dislocation. No bony erosion or periosteal elevation. Degenerative changes are noted at the first metatarsophalangeal joint. No soft tissue abnormality. IMPRESSION: No acute osseous abnormality. Electronically Signed   By: Brett Fairy M.D.   On: 01/07/2022 21:09      Parke Jandreau T. [place]  If 7PM-7AM, please contact night-coverage [email or web address] 01/08/2022, 2:38 PM

## 2022-01-08 NOTE — Progress Notes (Addendum)
Initial Nutrition Assessment  DOCUMENTATION CODES:   Severe malnutrition in context of chronic illness, Underweight  INTERVENTION:   -Ensure Plus High Protein po TID, each supplement provides 350 kcal and 20 grams of protein.   To aid in wound healing: -500 mg Vitamin C BID -220 mg Zinc sulfate daily x 14 days  -Will check Vitamin B-12, D and A labs given nonhealing wounds and h/o gastric bypass. In case patient discharges prior to results coming back, will follow results and alert PCP if abnormal.  -Will place vitamin recommendations in AVS for pt with history of gastric bypass.  NUTRITION DIAGNOSIS:   Severe Malnutrition related to chronic illness (chronic pancreatitis, h/o multiple gastric surgeries including gastric bypass) as evidenced by severe fat depletion, severe muscle depletion, energy intake < or equal to 75% for > or equal to 1 month.  GOAL:   Patient will meet greater than or equal to 90% of their needs  MONITOR:   PO intake  REASON FOR ASSESSMENT:   Consult Assessment of nutrition requirement/status  ASSESSMENT:   66 y.o. female with medical history significant of alcoholic cirrhosis, chronic pancreatitis, chronic pain, GERD. Presenting with a right foot wound.  Patient in room, no family at bedside. Pt reports a lot of pain. Patient reports this has diminished her appetite and she is not eating well. Tries to drink Boost or Ensure but only completes 1/2 bottle.  She has mainly been consuming 1 meal day, she tries to take her time and make she chews her food well. Eats small bites at a time. Her pancreatitis flares have also impacted her PO intakes. Pt on Creon.   Pt is expected to have a toe amputation tomorrow (10/28). Given multiple wounds, with future post-op needs and h/o gastric bypass (along with other gastric surgeries), will order Vitamin C and Zinc to aid in wound healing.   Pt has not been compliant with vitamin regimen at home. Has a history of  Vitamin D deficiency. Will check labs for Vitamin D, B-12 and Vitamin A given nonhealing wounds. Discussed vitamin labs with patient and patient agreed to have them checked. RD will follow results.  Pt reports 5 lbs of weight loss recently.   Medications: Colace, Creon, Multivitamin with minerals daily, KLOR-CON  Labs reviewed: Low K   NUTRITION - FOCUSED PHYSICAL EXAM:  Flowsheet Row Most Recent Value  Orbital Region Severe depletion  Upper Arm Region Severe depletion  Thoracic and Lumbar Region Moderate depletion  Buccal Region Severe depletion  Temple Region Severe depletion  Clavicle Bone Region Severe depletion  Clavicle and Acromion Bone Region Severe depletion  Scapular Bone Region Severe depletion  Dorsal Hand Unable to assess  [red, swollen, bandaged]  Patellar Region Moderate depletion  Anterior Thigh Region Moderate depletion  Posterior Calf Region Moderate depletion  Edema (RD Assessment) Moderate  [BLEs and bilateral hands]  Hair Reviewed  Eyes Reviewed  Mouth Reviewed  Skin Reviewed  Nails Reviewed       Diet Order:   Diet Order             Diet regular Room service appropriate? Yes; Fluid consistency: Thin  Diet effective now                   EDUCATION NEEDS:   Education needs have been addressed  Skin:  Skin Assessment: Skin Integrity Issues: Skin Integrity Issues:: Stage I, Other (Comment) Stage I: mid coccyx Other: non-pressure wounds: right ankle, right toe, bottom of right  foot, burn on hands  Last BM:  10/27 -type 4  Height:   Ht Readings from Last 1 Encounters:  01/06/22 5\' 8"  (1.727 m)    Weight:   Wt Readings from Last 1 Encounters:  01/06/22 49.9 kg    BMI:  Body mass index is 16.73 kg/m.  Estimated Nutritional Needs:   Kcal:  1900-2100  Protein:  95-105g  Fluid:  2.1L/day   Carolyn Bibles, MS, RD, LDN Inpatient Clinical Dietitian Contact information available via Amion

## 2022-01-08 NOTE — Plan of Care (Signed)
  Problem: Education: Goal: Knowledge of General Education information will improve Description: Including pain rating scale, medication(s)/side effects and non-pharmacologic comfort measures Outcome: Progressing   Problem: Pain Managment: Goal: General experience of comfort will improve Outcome: Progressing   Problem: Safety: Goal: Ability to remain free from injury will improve Outcome: Progressing   

## 2022-01-08 NOTE — Progress Notes (Signed)
PODIATRY PROGRESS NOTE  NAME Carolyn [name] MRN [id number] DOB 03-Sep-1955 DOA 01/06/2022   Reason for consult: Ulcers B/L feet and toes Chief Complaint  Patient presents with   Foot Wound   History of present illness: 66 y.o. female nondiabetic, not a smoker, malnourished, presenting for bilateral foot cellulitis/with ulcers.  Most concerning the right second toe.  X-ray raise concern for cellulitis and osteomyelitis but negative on more sensitive MRI.  Podiatry consulted.  Patient presents this evening with her significant other at bedside.  Past Medical History:  Diagnosis Date   Alcoholic cirrhosis ([place])    Chronic pancreatitis ([place])    Endometriosis    GERD (gastroesophageal reflux disease)    Liver disease    Malabsorption    MALABSORPTION SYNDROME   Osteoporosis 03/2011   t score -2.5   Pancreatitis    due to cyst and tumors due to calcifications   Seizure ([place])    no seizure disorder   Vitamin D deficiency 2012   VIT D 15       Latest Ref Rng & Units 01/07/2022   11:54 AM 01/06/2022    8:25 PM 09/04/2020    9:24 PM  CBC  WBC 4.0 - 10.5 K/uL 10.0  10.3  4.7   Hemoglobin 12.0 - 15.0 g/dL 13.8  10.9  12.7   Hematocrit 36.0 - 46.0 % 41.8  32.5  38.2   Platelets 150 - 400 K/uL 192  180  87        Latest Ref Rng & Units 01/07/2022   11:55 AM 01/06/2022    8:25 PM 09/04/2020    9:24 PM  BMP  Glucose 70 - 99 mg/dL 108  127  124   BUN 8 - 23 mg/dL 18  23  13    Creatinine 0.44 - 1.00 mg/dL 0.99  1.18  0.92   Sodium 135 - 145 mmol/L 136  134  139   Potassium 3.5 - 5.1 mmol/L 3.2  2.1  3.3   Chloride 98 - 111 mmol/L 99  96  100   CO2 22 - 32 mmol/L 28  28  24    Calcium 8.9 - 10.3 mg/dL 7.6  7.2  8.4          Physical Exam: General: The patient is alert and oriented x3 in no acute distress.   Dermatology: Overall skin is very frail.  Ulcer noted to the distal tip of the right second toe.  With evaluation there is a necrotic fibrotic wound base however  there is no exposed bone.  Wound appears somewhat stable. Superficial partial-thickness blister plantar medial arch right foot.  Stable. To a lesser extent, multiple other areas of skin breakdown and superficial wounds.  Also to the left second toe which appears stable with a small well adhered eschar/scab.  Vascular: Skin warm to touch.  Capillary refill WNL.  Neurological: Grossly Intact via light touch  Musculoskeletal Exam: Reducible hammertoe deformity noted to the lesser digits.  Correlating with especially the ulcers to the bilateral second toes creating pressure wounds  MR FOOT RT WO CONTRAST 01/07/2022: IMPRESSION: 1. Focal marrow edema of the distal phalanx of the first digit, which without evidence of adjacent skin defect could be secondary to bone contusion or abnormal stress. 2. No MR evidence of osteomyelitis of the second toe. 3. Generalized edema about the plantar muscles and tendon, which may be secondary to diabetic myopathy/myositis. 4. Subcutaneous soft tissue edema about the dorsum of the foot without  evidence of fluid collection or abscess.  ASSESSMENT/PLAN OF CARE Bilateral foot ulcers/cellulitis -Evaluating patient I do not believe that she needs any surgical amputation/intervention at this time other than good local wound care as a conservative approach prior to amputation -Patient would do well with possible flexor tenotomy's of the second digits bilateral, this can be performed outpatient in office setting -Wound care: Aquacel Ag to RT 2nd toe ulcer, LT medial ankle ulcer. Silvadene cream to RT plantar arch blister. Daily w/ DSD. Orders placed.  -WBAT surgical shoes B/L.  Orders placed. -Recommend DC on p.o. ABX x 7 days. -From a podiatry standpoint patient is stable.  Okay for discharge with outpatient follow-up within 1 week to initiate routine wound care  Please contact me directly via secure chat with any questions or concerns.    Edrick Kins, DPM [facility]  Dr. Edrick Kins, DPM    [address], [place] [address]                Office [phone]  Fax [phone]

## 2022-01-09 DIAGNOSIS — N179 Acute kidney failure, unspecified: Secondary | ICD-10-CM | POA: Diagnosis not present

## 2022-01-09 DIAGNOSIS — R5381 Other malaise: Secondary | ICD-10-CM

## 2022-01-09 DIAGNOSIS — G894 Chronic pain syndrome: Secondary | ICD-10-CM | POA: Diagnosis not present

## 2022-01-09 DIAGNOSIS — K860 Alcohol-induced chronic pancreatitis: Secondary | ICD-10-CM | POA: Diagnosis not present

## 2022-01-09 DIAGNOSIS — L03115 Cellulitis of right lower limb: Secondary | ICD-10-CM | POA: Diagnosis not present

## 2022-01-09 LAB — CBC
HCT: 29.8 % — ABNORMAL LOW (ref 36.0–46.0)
HCT: 30.7 % — ABNORMAL LOW (ref 36.0–46.0)
Hemoglobin: 10.2 g/dL — ABNORMAL LOW (ref 12.0–15.0)
Hemoglobin: 10 g/dL — ABNORMAL LOW (ref 12.0–15.0)
MCH: 31.5 pg (ref 26.0–34.0)
MCH: 31.3 pg (ref 26.0–34.0)
MCHC: 34.2 g/dL (ref 30.0–36.0)
MCHC: 32.6 g/dL (ref 30.0–36.0)
MCV: 92 fL (ref 80.0–100.0)
MCV: 95.9 fL (ref 80.0–100.0)
Platelets: 158 10*3/uL (ref 150–400)
Platelets: 136 10*3/uL — ABNORMAL LOW (ref 150–400)
RBC: 3.24 MIL/uL — ABNORMAL LOW (ref 3.87–5.11)
RBC: 3.2 MIL/uL — ABNORMAL LOW (ref 3.87–5.11)
RDW: 14.4 % (ref 11.5–15.5)
RDW: 14.4 % (ref 11.5–15.5)
WBC: 11.3 10*3/uL — ABNORMAL HIGH (ref 4.0–10.5)
WBC: 9.4 10*3/uL (ref 4.0–10.5)
nRBC: 0 % (ref 0.0–0.2)
nRBC: 0 % (ref 0.0–0.2)

## 2022-01-09 LAB — LACTIC ACID, PLASMA: Lactic Acid, Venous: 2.3 mmol/L (ref 0.5–1.9)

## 2022-01-09 LAB — PHOSPHORUS: Phosphorus: 1.1 mg/dL — ABNORMAL LOW (ref 2.5–4.6)

## 2022-01-09 LAB — HIV ANTIBODY (ROUTINE TESTING W REFLEX): HIV Screen 4th Generation wRfx: NONREACTIVE

## 2022-01-09 LAB — VITAMIN B12: Vitamin B-12: 1557 pg/mL — ABNORMAL HIGH (ref 180–914)

## 2022-01-09 LAB — COMPREHENSIVE METABOLIC PANEL
ALT: 31 U/L (ref 0–44)
AST: 41 U/L (ref 15–41)
Albumin: 1.6 g/dL — ABNORMAL LOW (ref 3.5–5.0)
Alkaline Phosphatase: 128 U/L — ABNORMAL HIGH (ref 38–126)
Anion gap: 6 (ref 5–15)
BUN: 11 mg/dL (ref 8–23)
CO2: 23 mmol/L (ref 22–32)
Calcium: 7 mg/dL — ABNORMAL LOW (ref 8.9–10.3)
Chloride: 105 mmol/L (ref 98–111)
Creatinine, Ser: 0.78 mg/dL (ref 0.44–1.00)
GFR, Estimated: >60 mL/min (ref >60)
Glucose, Bld: 182 mg/dL — ABNORMAL HIGH (ref 70–99)
Potassium: 3.3 mmol/L — ABNORMAL LOW (ref 3.5–5.1)
Sodium: 134 mmol/L — ABNORMAL LOW (ref 135–145)
Total Bilirubin: 0.8 mg/dL (ref 0.3–1.2)
Total Protein: 4.8 g/dL — ABNORMAL LOW (ref 6.5–8.1)

## 2022-01-09 LAB — VITAMIN D 25 HYDROXY (VIT D DEFICIENCY, FRACTURES): Vit D, 25-Hydroxy: 19.56 ng/mL — ABNORMAL LOW (ref 30–100)

## 2022-01-09 LAB — MAGNESIUM: Magnesium: 1.5 mg/dL — ABNORMAL LOW (ref 1.7–2.4)

## 2022-01-09 MED ORDER — MAGNESIUM SULFATE 2 GM/50ML IV SOLN
2.0000 g | Freq: Once | INTRAVENOUS | Status: AC
  Administered 2022-01-09: 2 g via INTRAVENOUS
  Filled 2022-01-09: qty 50

## 2022-01-09 MED ORDER — OXYCODONE HCL 5 MG PO TABS
5.0000 mg | ORAL_TABLET | Freq: Four times a day (QID) | ORAL | Status: DC | PRN
  Administered 2022-01-09: 5 mg via ORAL
  Filled 2022-01-09 (×2): qty 1

## 2022-01-09 MED ORDER — ZINC SULFATE 220 (50 ZN) MG PO CAPS: 220.0000 mg | ORAL_CAPSULE | Freq: Every day | ORAL | Status: DC

## 2022-01-09 MED ORDER — SODIUM CHLORIDE 0.9 % IV BOLUS
500.0000 mL | Freq: Once | INTRAVENOUS | Status: AC
  Administered 2022-01-09: 500 mL via INTRAVENOUS

## 2022-01-09 MED ORDER — CEFAZOLIN SODIUM-DEXTROSE 1-4 GM/50ML-% IV SOLN
1.0000 g | Freq: Three times a day (TID) | INTRAVENOUS | Status: AC
  Administered 2022-01-09 – 2022-01-16 (×20): 1 g via INTRAVENOUS
  Filled 2022-01-09 (×21): qty 50

## 2022-01-09 MED ORDER — POTASSIUM PHOSPHATES 15 MMOLE/5ML IV SOLN
30.0000 mmol | Freq: Once | INTRAVENOUS | Status: AC
  Administered 2022-01-09: 30 mmol via INTRAVENOUS
  Filled 2022-01-09: qty 10

## 2022-01-09 MED ORDER — POTASSIUM CHLORIDE CRYS ER 20 MEQ PO TBCR
40.0000 meq | EXTENDED_RELEASE_TABLET | Freq: Once | ORAL | Status: AC
  Administered 2022-01-09: 40 meq via ORAL
  Filled 2022-01-09: qty 2

## 2022-01-09 MED ORDER — ASCORBIC ACID 500 MG PO TABS: 500.0000 mg | ORAL_TABLET | Freq: Two times a day (BID) | ORAL | Status: DC

## 2022-01-09 MED ORDER — BUPRENORPHINE 5 MCG/HR TD PTWK
4.0000 | MEDICATED_PATCH | TRANSDERMAL | Status: DC
  Administered 2022-01-09: 4 via TRANSDERMAL
  Filled 2022-01-09 (×3): qty 4

## 2022-01-09 MED ORDER — FENTANYL CITRATE PF 50 MCG/ML IJ SOSY
25.0000 ug | PREFILLED_SYRINGE | INTRAMUSCULAR | Status: DC | PRN
  Administered 2022-01-09 – 2022-01-10 (×5): 25 ug via INTRAVENOUS
  Filled 2022-01-09 (×5): qty 1

## 2022-01-09 NOTE — Evaluation (Addendum)
Physical Therapy Evaluation Patient Details Name: Carolyn [name] MRN: [id number] DOB: [birth year] Today's Date: 01/09/2022  History of Present Illness  66 y.o. female nondiabetic, not a smoker, malnourished, presenting for bilateral foot cellulitis/with ulcers.  Most concerning the right second toe.  X-ray raises concern for cellulitis and osteomyelitis but negative on more sensitive MRI.  Podiatry consulted. PMH: ETOH use, cirrhosis, liver disease, malabsorption syndrome, pancreatitis, Szs  Clinical Impression  Pt admitted with above diagnosis.  Pt is extremely limited by pain and global weakness/deconditioning. Pt is max assist for bed mobility and lateral transfers bed to chair.  HR up to 146 with transfer.  Resting 110s to 120s.  Recommend SNF   Pt currently with functional limitations due to the deficits listed below (see PT Problem List). Pt will benefit from skilled PT to increase their independence and safety with mobility to allow discharge to the venue listed below.         Recommendations for follow up therapy are one component of a multi-disciplinary discharge planning process, led by the attending physician.  Recommendations may be updated based on patient status, additional functional criteria and insurance authorization.  Follow Up Recommendations Skilled nursing-short term rehab (<3 hours/day) Can patient physically be transported by private vehicle: No    Assistance Recommended at Discharge Frequent or constant Supervision/Assistance  Patient can return home with the following  Two people to help with walking and/or transfers;Two people to help with bathing/dressing/bathroom;Help with stairs or ramp for entrance;Assist for transportation;Assistance with cooking/housework    Equipment Recommendations Other (comment) (defer to SNF)  Recommendations for Other Services       Functional Status Assessment Patient has had a recent decline in their functional status and  demonstrates the ability to make significant improvements in function in a reasonable and predictable amount of time.     Precautions / Restrictions Precautions Precautions: Fall Required Braces or Orthoses: Other Brace Other Brace: bil "post op" shoes Restrictions Weight Bearing Restrictions: No Other Position/Activity Restrictions: WBAT per podiatry progress note      Mobility  Bed Mobility Overal bed mobility: Needs Assistance Bed Mobility: Supine to Sit     Supine to sit: Max assist     General bed mobility comments: assist to progress LEs off bed and significant assist to elevate trunk to upright/midline    Transfers     Transfers: Bed to chair/wheelchair/BSC            Lateral/Scoot Transfers: Max assist General transfer comment: bed pad used to assist lateral scooting, pt able to push with UEs however limited by weakness    Ambulation/Gait                  Stairs            Wheelchair Mobility    Modified Rankin (Stroke Patients Only)       Balance Overall balance assessment: History of Falls, Needs assistance Sitting-balance support: Feet supported, Bilateral upper extremity supported, No upper extremity supported Sitting balance-Leahy Scale: Fair         Standing balance comment: unable to stand                             Pertinent Vitals/Pain Pain Assessment Pain Assessment: Faces Faces Pain Scale: Hurts whole lot Pain Location: feet and lower legs Pain Descriptors / Indicators: Aching, Sore, Tender Pain Intervention(s): Limited activity within patient's tolerance, Monitored during session, Premedicated before session,  Repositioned, Patient requesting pain meds-RN notified    Home Living Family/patient expects to be discharged to:: Private residence Living Arrangements: Spouse/significant other Available Help at Discharge: Family Type of Home: House Home Access: Stairs to enter Entrance Stairs-Rails:  Right Entrance Stairs-Number of Steps: 8   Home Layout: One level Home Equipment: Agricultural consultant (2 wheels)      Prior Function Prior Level of Function : Independent/Modified Independent;History of Falls (last six months)             Mobility Comments: difficulty recently, reports multiple falls (last ~ 2 most ago)       Hand Dominance        Extremity/Trunk Assessment   Upper Extremity Assessment Upper Extremity Assessment: Defer to OT evaluation    Lower Extremity Assessment Lower Extremity Assessment: RLE deficits/detail;LLE deficits/detail RLE Deficits / Details: limited ROM all joints bil LEs, strength 2/5, severe limitations d/t pain and diffiuse atrophy RLE: Unable to fully assess due to pain LLE: Unable to fully assess due to pain       Communication   Communication: No difficulties  Cognition Arousal/Alertness: Awake/alert Behavior During Therapy: WFL for tasks assessed/performed Overall Cognitive Status: Within Functional Limits for tasks assessed                                          General Comments      Exercises     Assessment/Plan    PT Assessment Patient needs continued PT services  PT Problem List Decreased strength;Decreased range of motion;Decreased activity tolerance;Decreased balance;Decreased mobility;Decreased knowledge of precautions;Decreased knowledge of use of DME       PT Treatment Interventions DME instruction;Therapeutic exercise;Functional mobility training;Therapeutic activities;Patient/family education    PT Goals (Current goals can be found in the Care Plan section)  Acute Rehab PT Goals Patient Stated Goal: get stronger,have less pain PT Goal Formulation: With patient Time For Goal Achievement: 01/22/22 Potential to Achieve Goals: Good    Frequency Min 2X/week     Co-evaluation               AM-PAC PT "6 Clicks" Mobility  Outcome Measure Help needed turning from your back to your  side while in a flat bed without using bedrails?: A Lot Help needed moving from lying on your back to sitting on the side of a flat bed without using bedrails?: A Lot Help needed moving to and from a bed to a chair (including a wheelchair)?: A Lot Help needed standing up from a chair using your arms (e.g., wheelchair or bedside chair)?: A Lot Help needed to walk in hospital room?: Total Help needed climbing 3-5 steps with a railing? : Total 6 Click Score: 10    End of Session   Activity Tolerance: Patient limited by fatigue;Patient limited by pain Patient left: in chair;with call bell/phone within reach Nurse Communication: Mobility status PT Visit Diagnosis: Other abnormalities of gait and mobility (R26.89);Difficulty in walking, not elsewhere classified (R26.2);Muscle weakness (generalized) (M62.81)    Time: 5277-8242 PT Time Calculation (min) (ACUTE ONLY): 39 min   Charges:   PT Evaluation $PT Eval Low Complexity: 1 Low PT Treatments $Therapeutic Activity: 23-37 mins        Delice Bison, PT  Acute Rehab Dept [facility]) [phone]  [facility] Weekend Pager [facility] only)  [phone]  01/09/2022   [facility] 01/09/2022, 2:31 PM

## 2022-01-09 NOTE — Evaluation (Signed)
Occupational Therapy Evaluation Patient Details Name: Carolyn [name] MRN: [id number] DOB: [birth year] Today's Date: 01/09/2022   History of Present Illness 65 y.o. female nondiabetic, not a smoker, malnourished, presenting for bilateral foot cellulitis/with ulcers.  Most concerning the right second toe.  X-ray raises concern for cellulitis and osteomyelitis but negative on more sensitive MRI.  Podiatry consulted. PMH: ETOH use, cirrhosis, liver disease, malabsorption syndrome, pancreatitis, Szs   Clinical Impression   Pt reports functioning independently until about a month ago. She presents with significant B LE pain, generalized weakness, tightness in her abdomen and inability to stand. Pt requires set up to total assist for ADLs and max assist to laterally scoot from drop arm chair to bed. She needs mod assist for LEs back into bed. Pt will need post acute rehab in SNF prior to return home. Will follow acutely.      Recommendations for follow up therapy are one component of a multi-disciplinary discharge planning process, led by the attending physician.  Recommendations may be updated based on patient status, additional functional criteria and insurance authorization.   Follow Up Recommendations  Skilled nursing-short term rehab (<3 hours/day)    Assistance Recommended at Discharge Frequent or constant Supervision/Assistance  Patient can return home with the following      Functional Status Assessment  Patient has had a recent decline in their functional status and demonstrates the ability to make significant improvements in function in a reasonable and predictable amount of time.  Equipment Recommendations  Other (comment) (defer to next venue)    Recommendations for Other Services       Precautions / Restrictions Precautions Precautions: Fall Required Braces or Orthoses: Other Brace Other Brace: bil "post op" shoes Restrictions Weight Bearing Restrictions: No Other  Position/Activity Restrictions: WBAT per podiatry progress note      Mobility Bed Mobility Overal bed mobility: Needs Assistance Bed Mobility: Sit to Supine       Sit to supine: Mod assist   General bed mobility comments: assist for LE back into bed    Transfers Overall transfer level: Needs assistance   Transfers: Bed to chair/wheelchair/BSC            Lateral/Scoot Transfers: Max assist General transfer comment: used pad to assist hips to return pt to bed      Balance Overall balance assessment: History of Falls, Needs assistance Sitting-balance support: Feet supported, Bilateral upper extremity supported, No upper extremity supported Sitting balance-Leahy Scale: Fair         Standing balance comment: unable to stand                           ADL either performed or assessed with clinical judgement   ADL Overall ADL's : Needs assistance/impaired Eating/Feeding: Independent   Grooming: Set up;Sitting   Upper Body Bathing: Minimal assistance;Sitting   Lower Body Bathing: Total assistance;Sitting/lateral leans;Bed level   Upper Body Dressing : Minimal assistance;Sitting   Lower Body Dressing: Total assistance;Sitting/lateral leans;Bed level   Toilet Transfer: Maximal assistance Toilet Transfer Details (indicate cue type and reason): lateral scoot Toileting- Clothing Manipulation and Hygiene: Minimal assistance;Sitting/lateral lean               Vision Baseline Vision/History: 1 Wears glasses Ability to See in Adequate Light: 0 Adequate Patient Visual Report: No change from baseline       Perception     Praxis      Pertinent Vitals/Pain Pain Assessment Pain  Assessment: Faces Faces Pain Scale: Hurts even more Pain Location: feet and lower legs Pain Descriptors / Indicators: Aching, Sore, Tender Pain Intervention(s): Monitored during session, Premedicated before session, Repositioned     Hand Dominance Right    Extremity/Trunk Assessment Upper Extremity Assessment Upper Extremity Assessment: Overall WFL for tasks assessed   Lower Extremity Assessment Lower Extremity Assessment: Defer to PT evaluation RLE Deficits / Details: limited ROM all joints bil LEs, strength 2/5, severe limitations d/t pain and diffiuse atrophy RLE: Unable to fully assess due to pain LLE: Unable to fully assess due to pain   Cervical / Trunk Assessment Cervical / Trunk Assessment: Other exceptions (tight, distended abdomen)   Communication Communication Communication: No difficulties   Cognition Arousal/Alertness: Awake/alert Behavior During Therapy: WFL for tasks assessed/performed Overall Cognitive Status: Within Functional Limits for tasks assessed                                       General Comments       Exercises     Shoulder Instructions      Home Living Family/patient expects to be discharged to:: Private residence Living Arrangements: Spouse/significant other Available Help at Discharge: Family Type of Home: House Home Access: Stairs to enter Secretary/administrator of Steps: 8 Entrance Stairs-Rails: Right Home Layout: One level               Home Equipment: Agricultural consultant (2 wheels)          Prior Functioning/Environment Prior Level of Function : Independent/Modified Independent;History of Falls (last six months)             Mobility Comments: difficulty recently, reports multiple falls (last ~ 2 most ago) ADLs Comments: until a month ago, was completely independent and taking care of her farm animals and dogs        OT Problem List: Decreased strength;Impaired balance (sitting and/or standing);Decreased activity tolerance;Pain      OT Treatment/Interventions: Self-care/ADL training;Therapeutic exercise;Therapeutic activities;Patient/family education;Balance training;DME and/or AE instruction    OT Goals(Current goals can be found in the care plan  section) Acute Rehab OT Goals OT Goal Formulation: With patient Time For Goal Achievement: 01/23/22 Potential to Achieve Goals: Good ADL Goals Pt Will Perform Lower Body Bathing: with mod assist;with adaptive equipment;sitting/lateral leans Pt Will Perform Lower Body Dressing: with mod assist;sitting/lateral leans;with adaptive equipment Pt Will Transfer to Toilet: with min assist;with transfer board;bedside commode Pt Will Perform Toileting - Clothing Manipulation and hygiene: with supervision;sitting/lateral leans Pt/caregiver will Perform Home Exercise Program: Both right and left upper extremity;Increased strength;With theraband;Independently (level 2)  OT Frequency: Min 2X/week    Co-evaluation              AM-PAC OT "6 Clicks" Daily Activity     Outcome Measure Help from another person eating meals?: None Help from another person taking care of personal grooming?: A Little Help from another person toileting, which includes using toliet, bedpan, or urinal?: A Lot Help from another person bathing (including washing, rinsing, drying)?: A Lot Help from another person to put on and taking off regular upper body clothing?: A Little Help from another person to put on and taking off regular lower body clothing?: Total 6 Click Score: 15   End of Session Nurse Communication: Other (comment) (abdominal discomfort)  Activity Tolerance: Patient limited by pain Patient left: in bed;with call bell/phone within reach;with bed alarm set  OT Visit Diagnosis: Pain;Muscle weakness (generalized) (M62.81)                Time: 0712-1975 OT Time Calculation (min): 26 min Charges:  OT General Charges $OT Visit: 1 Visit OT Evaluation $OT Eval Moderate Complexity: 1 Mod OT Treatments $Self Care/Home Management : 8-22 mins  Berna Spare, OTR/L Acute Rehabilitation Services Office: [phone]   Evern Bio 01/09/2022, 4:09 PM

## 2022-01-09 NOTE — Progress Notes (Signed)
PROGRESS NOTE  Carolyn [name] HYI:[id number] DOB: 21-Oct-1955   PCP: London Pepper, MD  Patient is from: Home  DOA: 01/06/2022 LOS: 2  Chief complaints Chief Complaint  Patient presents with   Foot Wound     Brief Narrative / Interim history: 66 year old F with PMH of chronic alcoholic pancreatitis with chronic pain and pancreatic insufficiency, GERD, severe malnutrition and osteoporosis presenting with right foot wound that has initially started as a blister/bulla due to poorly fitted boots when she went on trip.  Right foot x-ray raise concern for acute osteomyelitis but this was excluded by MRI of right foot.  Is being treated for cellulitis.  Evaluated by podiatry who recommended antibiotic for 7 days, wound care and outpatient follow-up.  Therapy recommended SNF    Subjective: Seen and examined earlier this morning.  No major events overnight of this morning.  Continues to endorse significant pain in both legs.  She rates her pain 6/10.  She also feels weak.  She is anxious to go home due to difficulty ambulating.  Objective: Vitals:   01/08/22 2024 01/09/22 0454 01/09/22 0847 01/09/22 1201  BP: 126/74 110/72 (!) 128/93 131/85  Pulse: (!) 126 (!) 125 (!) 108 (!) 109  Resp: _0 Temp: 99.2 F (37.3 C) 98.7 F (37.1 C) 98.1 F (36.7 C) 98.9 F (37.2 C)  TempSrc: Oral Oral  Oral  SpO2: 97% 99% 92% 95%  Weight:      Height:        Examination:  GENERAL: Appears frail. HEENT: MMM.  Vision and hearing grossly intact.  NECK: Supple.  No apparent JVD.  RESP:  No IWOB.  Fair aeration bilaterally. CVS: Cardiac to 110s heart sounds normal.  ABD/GI/GU: BS+. Abd soft, NTND.  MSK/EXT:  Significant muscle mass and subcu fat loss.  2+ DP pulses bilaterally SKIN: Superficial skin lesion from broken bullae over the plantar aspect of right foot.  Small scabbed wounds over plantar aspect of bilateral second toes.  Small scattered scabbed wound over dorsal aspect of both  feet.  NEURO: Awake and alert. Oriented appropriately.  No apparent focal neuro deficit. PSYCH: Calm. Normal affect.    Procedures:  None  Microbiology summarized: Blood cultures NGTD  Assessment and plan: Principal Problem:   Osteomyelitis ([place]) Active Problems:   Chronic pancreatitis ([facility])   Hypokalemia   Hypomagnesemia   AKI (acute kidney injury) ([place])   Protein-calorie malnutrition, severe ([facility])   Chronic pain syndrome   Cellulitis of right foot   Normocytic anemia   Gastroesophageal reflux disease without esophagitis   Pressure injury of skin  Bilateral feet cellulitis/ulceration Bilateral second toe deformity -Right foot x-ray raise concern for cellulitis and osteomyelitis but negative on MRI -Left foot x-ray suspicious for cellulitis but no osteo -no leukocytosis.  CRP 1.3.  ESR 15. -Patient is not diabetic.  Not a smoker. -De-escalate antibiotic to Ancef. -No plan for surgery.  Podiatry recommends 7 days of antibiotic and wound care -Pain control -Optimize nutrition.  Appreciate help by dietitian.  AKI: Resolved. Recent Labs    01/06/22 2025 01/07/22 1155 01/08/22 1926 01/09/22 0955  BUN _1 CREATININE 1.18* 0.99 0.89 0.78  -Monitor intermittently -Discontinue IV fluid  Hypokalemia/hypomagnesemia/hyponatremia: Refeeding syndrome? -Monitor replenish as appropriate   Chronic alcoholic pancreatitis/pancreatic insufficiency/chronic pain: Sober for over a year. -Pain control-resume home [place].  Continue fentanyl as needed -Continue home [place]on -Consult dietitian  Lactic acidosis: Likely type B. -Continue monitoring   Normocytic  anemia: H&H relatively stable. Recent Labs    01/06/22 2025 01/07/22 1154 01/09/22 0016 01/09/22 0955  HGB 10.9* 13.8 10.2* 10.0*  -Monitor H&H -Check anemia panel  Mild sinus tachycardia -Optimize electrolytes  GERD  - PPI  Vitamin D insufficiency -Supplement  Physical deconditioning/ambulatory  dysfunction -PT/OT-recommended SNF  Severe protein caloric malnutrition Body mass index is 16.73 kg/m. Nutrition Problem: Severe Malnutrition Etiology: chronic illness (chronic pancreatitis, h/o multiple gastric surgeries including gastric bypass) Signs/Symptoms: severe fat depletion, severe muscle depletion, energy intake < or equal to 75% for > or equal to 1 month Interventions: Ensure Enlive (each supplement provides 350kcal and 20 grams of protein), MVI, Education  Pressure skin injury: POA Pressure Injury 01/07/22 Coccyx Mid Stage 1 -  Intact skin with non-blanchable redness of a localized area usually over a bony prominence. (Active)  01/07/22 1848  Location: Coccyx  Location Orientation: Mid  Staging: Stage 1 -  Intact skin with non-blanchable redness of a localized area usually over a bony prominence.  Wound Description (Comments):   Present on Admission: Yes  Dressing Type Foam - Lift dressing to assess site every shift 01/09/22 0900   DVT prophylaxis:  enoxaparin (LOVENOX) injection 40 mg Start: 01/07/22 2000  Code Status: Full code Family Communication: None at bedside Level of care: Telemetry Status is: Inpatient Remains inpatient appropriate because: Bilateral foot infection, pain, electrolyte derangement and safe disposition   Final disposition: SNF Consultants:  Podiatry-signed off  Sch Meds:  Scheduled Meds:  ascorbic acid  500 mg Oral BID   buprenorphine  4 patch Transdermal Weekly   docusate sodium  100 mg Oral BID   enoxaparin (LOVENOX) injection  40 mg Subcutaneous Q24H   feeding supplement  237 mL Oral TID BM   lipase/protease/amylase  24,000 Units Oral TID WC   multivitamin with minerals  1 tablet Oral Daily   nortriptyline  30 mg Oral QHS   pantoprazole  40 mg Oral Daily   zinc sulfate  220 mg Oral Daily   Continuous Infusions:  sodium chloride 75 mL/hr at 01/09/22 0224   cefTRIAXone (ROCEPHIN)  IV 2 g (01/08/22 1952)   And   metronidazole  500 mg (01/09/22 0931)   potassium PHOSPHATE IVPB (in mmol) 30 mmol (01/09/22 1255)   PRN Meds:.fentaNYL (SUBLIMAZE) injection, ondansetron (ZOFRAN) IV, oxyCODONE, senna-docusate, tiZANidine  Antimicrobials: Anti-infectives (From admission, onward)    Start     Dose/Rate Route Frequency Ordered Stop   01/06/22 2030  cefTRIAXone (ROCEPHIN) 2 g in sodium chloride 0.9 % 100 mL IVPB       See Hyperspace for full Linked Orders Report.   2 g 200 mL/hr over 30 Minutes Intravenous Every 24 hours 01/06/22 2017 01/13/22 2029   01/06/22 2030  metroNIDAZOLE (FLAGYL) IVPB 500 mg       See Hyperspace for full Linked Orders Report.   500 mg 100 mL/hr over 60 Minutes Intravenous Every 12 hours 01/06/22 2017 01/13/22 2029        I have personally reviewed the following labs and images: CBC: Recent Labs  Lab 01/06/22 2025 01/07/22 1154 01/09/22 0016 01/09/22 0955  WBC 10.3 10.0 11.3* 9.4  NEUTROABS 8.1* 8.1*  --   --   HGB 10.9* 13.8 10.2* 10.0*  HCT 32.5* 41.8 29.8* 30.7*  MCV 92.3 93.7 92.0 95.9  PLT 180 192 158 136*   BMP &GFR Recent Labs  Lab 01/06/22 2025 01/06/22 2142 01/07/22 1155 01/08/22 1926 01/09/22 0955  NA 134*  --  136  135 134*  K 2.1*  --  3.2* 3.5 3.3*  CL 96*  --  99 106 105  CO2 28  --  _0 GLUCOSE 127*  --  108* 154* 182*  BUN 23  --  _1 CREATININE 1.18*  --  0.99 0.89 0.78  CALCIUM 7.2*  --  7.6* 7.1* 7.0*  MG  --  1.5* 2.1 1.6* 1.5*  PHOS  --   --   --   --  1.1*   Estimated Creatinine Clearance: 56 mL/min (by C-G formula based on SCr of 0.78 mg/dL). Liver & Pancreas: Recent Labs  Lab 01/07/22 1155 01/08/22 1926 01/09/22 0955  AST 77* 48* 41  ALT 44 32 31  ALKPHOS 147* 130* 128*  BILITOT 1.0 1.0 0.8  PROT 5.7* 4.8* 4.8*  ALBUMIN 2.1* 1.7* 1.6*   No results for input(s): "LIPASE", "AMYLASE" in the last 168 hours. No results for input(s): "AMMONIA" in the last 168 hours. Diabetic: Recent Labs    01/06/22 2025  HGBA1C 4.6*    No results for input(s): "GLUCAP" in the last 168 hours. Cardiac Enzymes: Recent Labs  Lab 01/08/22 1926  CKTOTAL 64   No results for input(s): "PROBNP" in the last 8760 hours. Coagulation Profile: No results for input(s): "INR", "PROTIME" in the last 168 hours. Thyroid Function Tests: No results for input(s): "TSH", "T4TOTAL", "FREET4", "T3FREE", "THYROIDAB" in the last 72 hours. Lipid Profile: No results for input(s): "CHOL", "HDL", "LDLCALC", "TRIG", "CHOLHDL", "LDLDIRECT" in the last 72 hours. Anemia Panel: Recent Labs    01/09/22 0956  VITAMINB12 1,557*   Urine analysis:    Component Value Date/Time   COLORURINE YELLOW 09/30/2017 [place] 09/30/2017 1449   LABSPEC <1.005 (L) 09/30/2017 1449   PHURINE 6.5 09/30/2017 1449   GLUCOSEU NEGATIVE 09/30/2017 1449   HGBUR NEGATIVE 09/30/2017 1449   BILIRUBINUR NEGATIVE 09/30/2017 1449   KETONESUR NEGATIVE 09/30/2017 1449   PROTEINUR NEGATIVE 09/30/2017 1449   UROBILINOGEN 4.0 (H) 06/07/2011 0007   NITRITE NEGATIVE 09/30/2017 1449   LEUKOCYTESUR TRACE (A) 09/30/2017 1449   Sepsis Labs: Invalid input(s): "PROCALCITONIN", "LACTICIDVEN"  Microbiology: Recent Results (from the past 240 hour(s))  Culture, blood (routine x 2)     Status: None (Preliminary result)   Collection Time: 01/06/22  8:25 PM   Specimen: BLOOD  Result Value Ref Range Status   Specimen Description   Final    BLOOD RIGHT ANTECUBITAL Performed at [facility], [address] [place], [place] [address]    Special Requests   Final    BOTTLES DRAWN AEROBIC AND ANAEROBIC Blood Culture adequate volume Performed at [facility], [address] [place], [place] [address]    Culture   Final    NO GROWTH 3 DAYS Performed at [facility], [place] [address] [place], [place] [address]    Report Status PENDING  Incomplete  Culture, blood (routine x 2)     Status: None (Preliminary result)   Collection Time:  01/07/22  6:51 PM   Specimen: BLOOD  Result Value Ref Range Status   Specimen Description   Final    BLOOD SITE NOT SPECIFIED Performed at [facility] [address], [place], [place] [address]    Special Requests   Final    BOTTLES DRAWN AEROBIC ONLY Blood Culture adequate volume Performed at [facility] [address] [place], [place] [address]  Culture   Final    NO GROWTH 2 DAYS Performed at [facility], [place] [address] [place], [place] [address]    Report Status PENDING  Incomplete  Culture, blood (Routine X 2) w Reflex to ID Panel     Status: None (Preliminary result)   Collection Time: 01/07/22  6:51 PM   Specimen: BLOOD  Result Value Ref Range Status   Specimen Description   Final    BLOOD SITE NOT SPECIFIED Performed at [facility] [address], [place], [place] [address]    Special Requests   Final    BOTTLES DRAWN AEROBIC ONLY Blood Culture adequate volume Performed at [facility] [address] [place], [place] [address]    Culture   Final    NO GROWTH < 12 HOURS Performed at [facility] [address], [place], [place] [address]    Report Status PENDING  Incomplete    Radiology Studies: VAS [place] ABI WITH/WO TBI  Result Date: 01/09/2022  LOWER EXTREMITY DOPPLER STUDY Patient Name:  MYRIAN BOTELLO  Date of Exam:   01/08/2022 Medical Rec #: [id number]       Accession #:    [id number] Date of Birth: [birth year]       Patient Gender: F Patient Age:   6 years Exam Location:  [facility] Procedure:      VAS [place] ABI WITH/WO TBI Referring Phys: Cherylann Ratel --------------------------------------------------------------------------------  Indications: Ulceration. High Risk Factors: None.  Comparison Study: No prior studies. Performing Technologist: Darlin Coco RDMS, RVT  Examination Guidelines: A complete evaluation includes at minimum, Doppler waveform signals and  systolic blood pressure reading at the level of bilateral brachial, anterior tibial, and posterior tibial arteries, when vessel segments are accessible. Bilateral testing is considered an integral part of a complete examination. Photoelectric Plethysmograph (PPG) waveforms and toe systolic pressure readings are included as required and additional duplex testing as needed. Limited examinations for reoccurring indications may be performed as noted.  ABI Findings: +--------+------------------+-----+---------+--------+ Right   Rt Pressure (mmHg)IndexWaveform Comment  +--------+------------------+-----+---------+--------+ [id number]                    triphasic         +--------+------------------+-----+---------+--------+ PTA     166               1.23 triphasic         +--------+------------------+-----+---------+--------+ DP      153               1.13 triphasic         +--------+------------------+-----+---------+--------+ +--------+------------------+-----+---------+-------+ Left    Lt Pressure (mmHg)IndexWaveform Comment +--------+------------------+-----+---------+-------+ [id number]                    triphasic        +--------+------------------+-----+---------+-------+ PTA     163               1.21 triphasic        +--------+------------------+-----+---------+-------+ DP      163               1.21 triphasic        +--------+------------------+-----+---------+-------+  Summary: Right: Resting right ankle-brachial index is within normal range. Left: Resting left ankle-brachial index is within normal range. *See table(s) above for measurements and observations.  Electronically signed by Jamelle Haring on 01/09/2022 at 10:40:55 AM.    Final  Luzelena Heeg T. Marion  If 7PM-7AM, please contact night-coverage [email or web address] 01/09/2022, 3:07 PM

## 2022-01-09 NOTE — Progress Notes (Signed)
Orthopedic Tech Progress Note Patient Details:  Carolyn [name] [birth year] [id number]  Ortho Devices Type of Ortho Device: Postop shoe/boot Ortho Device/Splint Location: BLE Ortho Device/Splint Interventions: Ordered, Application, Adjustment, Removal   Post Interventions Patient Tolerated: Well, Fair Instructions Provided: Care of device  [name] 01/09/2022, 9:34 AM

## 2022-01-09 NOTE — Plan of Care (Signed)
  Problem: Clinical Measurements: Goal: Ability to maintain clinical measurements within normal limits will improve Outcome: Progressing Goal: Will remain free from infection Outcome: Progressing   Problem: Pain Managment: Goal: General experience of comfort will improve Outcome: Progressing   Problem: Safety: Goal: Ability to remain free from injury will improve Outcome: Progressing   

## 2022-01-10 ENCOUNTER — Inpatient Hospital Stay (HOSPITAL_COMMUNITY): Payer: Medicare PPO

## 2022-01-10 DIAGNOSIS — G894 Chronic pain syndrome: Secondary | ICD-10-CM | POA: Diagnosis not present

## 2022-01-10 DIAGNOSIS — J90 Pleural effusion, not elsewhere classified: Secondary | ICD-10-CM

## 2022-01-10 DIAGNOSIS — R140 Abdominal distension (gaseous): Secondary | ICD-10-CM

## 2022-01-10 DIAGNOSIS — M869 Osteomyelitis, unspecified: Secondary | ICD-10-CM | POA: Diagnosis not present

## 2022-01-10 DIAGNOSIS — N179 Acute kidney failure, unspecified: Secondary | ICD-10-CM | POA: Diagnosis not present

## 2022-01-10 DIAGNOSIS — L03115 Cellulitis of right lower limb: Secondary | ICD-10-CM | POA: Diagnosis not present

## 2022-01-10 LAB — RETICULOCYTES
Immature Retic Fract: 11.1 % (ref 2.3–15.9)
RBC.: 3.12 MIL/uL — ABNORMAL LOW (ref 3.87–5.11)
Retic Count, Absolute: 54.3 10*3/uL (ref 19.0–186.0)
Retic Ct Pct: 1.7 % (ref 0.4–3.1)

## 2022-01-10 LAB — RENAL FUNCTION PANEL
Albumin: 1.5 g/dL — ABNORMAL LOW (ref 3.5–5.0)
Anion gap: 1 — ABNORMAL LOW (ref 5–15)
BUN: 9 mg/dL (ref 8–23)
CO2: 24 mmol/L (ref 22–32)
Calcium: 7 mg/dL — ABNORMAL LOW (ref 8.9–10.3)
Chloride: 106 mmol/L (ref 98–111)
Creatinine, Ser: 0.67 mg/dL (ref 0.44–1.00)
GFR, Estimated: >60 mL/min (ref >60)
Glucose, Bld: 148 mg/dL — ABNORMAL HIGH (ref 70–99)
Phosphorus: 1.7 mg/dL — ABNORMAL LOW (ref 2.5–4.6)
Potassium: 4.3 mmol/L (ref 3.5–5.1)
Sodium: 131 mmol/L — ABNORMAL LOW (ref 135–145)

## 2022-01-10 LAB — IRON AND TIBC
Iron: 58 ug/dL (ref 28–170)
Saturation Ratios: 59 % — ABNORMAL HIGH (ref 10.4–31.8)
TIBC: 98 ug/dL — ABNORMAL LOW (ref 250–450)
UIBC: 40 ug/dL

## 2022-01-10 LAB — CBC
HCT: 29.3 % — ABNORMAL LOW (ref 36.0–46.0)
Hemoglobin: 9.8 g/dL — ABNORMAL LOW (ref 12.0–15.0)
MCH: 31.9 pg (ref 26.0–34.0)
MCHC: 33.4 g/dL (ref 30.0–36.0)
MCV: 95.4 fL (ref 80.0–100.0)
Platelets: 132 10*3/uL — ABNORMAL LOW (ref 150–400)
RBC: 3.07 MIL/uL — ABNORMAL LOW (ref 3.87–5.11)
RDW: 14.6 % (ref 11.5–15.5)
WBC: 11.5 10*3/uL — ABNORMAL HIGH (ref 4.0–10.5)
nRBC: 0 % (ref 0.0–0.2)

## 2022-01-10 LAB — MAGNESIUM: Magnesium: 1.7 mg/dL (ref 1.7–2.4)

## 2022-01-10 LAB — FERRITIN: Ferritin: 236 ng/mL (ref 11–307)

## 2022-01-10 LAB — VITAMIN B12: Vitamin B-12: 1325 pg/mL — ABNORMAL HIGH (ref 180–914)

## 2022-01-10 LAB — FOLATE: Folate: 5.9 ng/mL — ABNORMAL LOW (ref >5.9)

## 2022-01-10 LAB — TSH: TSH: 10.926 u[IU]/mL — ABNORMAL HIGH (ref 0.350–4.500)

## 2022-01-10 MED ORDER — LACTATED RINGERS IV SOLN: INTRAVENOUS | Status: DC

## 2022-01-10 MED ORDER — SILVER SULFADIAZINE 1 % EX CREA
TOPICAL_CREAM | Freq: Two times a day (BID) | CUTANEOUS | Status: DC
  Administered 2022-01-10: 1 via TOPICAL
  Administered 2022-01-16: 2 via TOPICAL
  Administered 2022-01-17: 1 via TOPICAL
  Filled 2022-01-10: qty 50

## 2022-01-10 MED ORDER — LACTATED RINGERS IV BOLUS
500.0000 mL | Freq: Once | INTRAVENOUS | Status: AC
  Administered 2022-01-10: 500 mL via INTRAVENOUS

## 2022-01-10 MED ORDER — POLYETHYLENE GLYCOL 3350 17 G PO PACK: 17.0000 g | PACK | Freq: Two times a day (BID) | ORAL | Status: DC | PRN

## 2022-01-10 MED ORDER — SODIUM PHOSPHATES 45 MMOLE/15ML IV SOLN
30.0000 mmol | Freq: Once | INTRAVENOUS | Status: AC
  Administered 2022-01-10: 30 mmol via INTRAVENOUS
  Filled 2022-01-10: qty 10

## 2022-01-10 MED ORDER — OXYCODONE HCL 5 MG PO TABS
5.0000 mg | ORAL_TABLET | Freq: Three times a day (TID) | ORAL | Status: DC | PRN
  Administered 2022-01-10 – 2022-01-25 (×37): 5 mg via ORAL
  Filled 2022-01-10 (×40): qty 1

## 2022-01-10 MED ORDER — SENNOSIDES-DOCUSATE SODIUM 8.6-50 MG PO TABS: 1.0000 | ORAL_TABLET | Freq: Two times a day (BID) | ORAL | Status: DC | PRN

## 2022-01-10 MED ORDER — MAGNESIUM SULFATE 2 GM/50ML IV SOLN
2.0000 g | Freq: Once | INTRAVENOUS | Status: AC
  Administered 2022-01-10: 2 g via INTRAVENOUS
  Filled 2022-01-10: qty 50

## 2022-01-10 NOTE — Progress Notes (Signed)
PROGRESS NOTE  Carolyn [name] CNO:[id number] DOB: 1956/02/06   PCP: London Pepper, MD  Patient is from: Home  DOA: 01/06/2022 LOS: 3  Chief complaints Chief Complaint  Patient presents with   Foot Wound     Brief Narrative / Interim history: 66 year old F with PMH of chronic alcoholic pancreatitis with chronic pain and pancreatic insufficiency, GERD, severe malnutrition and osteoporosis presenting with right foot wound that has initially started as a blister/bulla due to poorly fitted boots when she went on trip.  Right foot x-ray raise concern for acute osteomyelitis but this was excluded by MRI of right foot.  Is being treated for cellulitis.  Evaluated by podiatry who recommended antibiotic for 7 days, wound care and outpatient follow-up.  Therapy recommended SNF    Subjective: Seen and examined earlier this morning.  She says she does not feel good.  Continues to endorse significant pain in both feet.  She rates her pain 7/10.  More on the right.  Feels her abdomen is tight.  Not eating well.  Reports feeling full quickly.  She thinks her last bowel movement was yesterday.  Not sure when she last passed gas.  Not making much urine.  Drinking enough either.  Objective: Vitals:   01/09/22 0847 01/09/22 1201 01/09/22 2216 01/10/22 1136  BP: (!) 128/93 131/85 (!) 147/83 138/84  Pulse: (!) 108 (!) 109 (!) 106 (!) 110  Resp: _0 Temp: 98.1 F (36.7 C) 98.9 F (37.2 C) 98.3 F (36.8 C) 98.2 F (36.8 C)  TempSrc:  Oral Oral   SpO2: 92% 95% 94% 93%  Weight:      Height:        Examination:  GENERAL: Appears frail. HEENT: MMM.  Vision and hearing grossly intact.  NECK: Supple.  No apparent JVD.  RESP:  No IWOB.  Fair aeration bilaterally. CVS:  RRR. Heart sounds normal.  ABD/GI/GU: BS+. Abd soft, NTND.  MSK/EXT:  Moves extremities.  Some degree of contractures.  Significant muscle mass and subcu fat loss. SKIN: skin lesion from broken bullae over the plantar  aspect of right foot.  Small scabbed wounds over plantar aspect of bilateral second toes.  Small scattered scabbed wound over dorsal aspect of both feet.  NEURO: Awake and alert. Oriented appropriately.  No apparent focal neuro deficit. PSYCH: Calm. Normal affect.    Procedures:  None  Microbiology summarized: Blood cultures NGTD  Assessment and plan: Principal Problem:   Osteomyelitis ([place]) Active Problems:   Chronic pancreatitis (HCC)   Hypokalemia   Hypomagnesemia   AKI (acute kidney injury) ([place])   Protein-calorie malnutrition, severe (HCC)   Chronic pain syndrome   Cellulitis of right foot   Normocytic anemia   Gastroesophageal reflux disease without esophagitis   Pressure injury of skin   Physical deconditioning  Bilateral feet cellulitis/ulceration Bilateral second toe deformity -Right foot x-ray raise concern for cellulitis and osteomyelitis but negative on MRI -Left foot x-ray suspicious for cellulitis but no osteo -No leukocytosis.  CRP 1.3.  ESR 15. -Patient is not diabetic.  Not a smoker. -No plan for surgery.  Podiatry recommends 7 days of antibiotic and wound care -De-escalated antibiotic to Ancef-last day of antibiotics 10/4. -Pain control -Optimize nutrition.  Appreciate help by dietitian.  AKI: Resolved but not making much urine. Recent Labs    01/06/22 2025 01/07/22 1155 01/08/22 1926 01/09/22 0955 01/10/22 0401  BUN _1 CREATININE 1.18* 0.99 0.89 0.78 0.67  -Give  LR bolus followed by maintenance at 65 cc an hour -Strict intake and output. -Bladder scan intermittently  Abdominal distention: No nausea or vomiting.  He has chronic abdominal pain but no acute change.  KUB raises concern for ileus and pleural effusion.  LBM today. -Discontinue fentanyl -Decrease oxycodone to 5 mg every 8 hours as needed severe pain -Continue home Butrans -Continue bowel regimen  Possible pleural effusion -Get two-view chest  x-ray  Hypokalemia/hypomagnesemia/hyponatremia: Refeeding syndrome?  Improved. -Monitor replenish as appropriate   Chronic alcoholic pancreatitis/pancreatic insufficiency/chronic pain: Sober for over a year. -Pain control-resume home Butrans.  Continue fentanyl as needed -Continue home Creon -Consult dietitian  Lactic acidosis: Likely type B. -Continue monitoring   Normocytic anemia: H&H relatively stable. Recent Labs    01/06/22 2025 01/07/22 1154 01/09/22 0016 01/09/22 0955 01/10/22 0401  HGB 10.9* 13.8 10.2* 10.0* 9.8*  -Monitor H&H -Check anemia panel  Mild sinus tachycardia -Optimize electrolytes  GERD  - PPI  Vitamin D insufficiency -Supplement  Physical deconditioning/ambulatory dysfunction -PT/OT-recommended SNF  Severe protein caloric malnutrition Body mass index is 16.73 kg/m. Nutrition Problem: Severe Malnutrition Etiology: chronic illness (chronic pancreatitis, h/o multiple gastric surgeries including gastric bypass) Signs/Symptoms: severe fat depletion, severe muscle depletion, energy intake < or equal to 75% for > or equal to 1 month Interventions: Ensure Enlive (each supplement provides 350kcal and 20 grams of protein), MVI, Education  Pressure skin injury: POA Pressure Injury 01/07/22 Coccyx Mid Stage 1 -  Intact skin with non-blanchable redness of a localized area usually over a bony prominence. (Active)  01/07/22 1848  Location: Coccyx  Location Orientation: Mid  Staging: Stage 1 -  Intact skin with non-blanchable redness of a localized area usually over a bony prominence.  Wound Description (Comments):   Present on Admission: Yes  Dressing Type Foam - Lift dressing to assess site every shift 01/10/22 0837   DVT prophylaxis:  enoxaparin (LOVENOX) injection 40 mg Start: 01/07/22 2000  Code Status: Full code Family Communication: None at bedside Level of care: Telemetry Status is: Inpatient Remains inpatient appropriate because: Safe  disposition/SNF   Final disposition: SNF Consultants:  Podiatry-signed off  Sch Meds:  Scheduled Meds:  ascorbic acid  500 mg Oral BID   buprenorphine  4 patch Transdermal Weekly   docusate sodium  100 mg Oral BID   enoxaparin (LOVENOX) injection  40 mg Subcutaneous Q24H   feeding supplement  237 mL Oral TID BM   lipase/protease/amylase  24,000 Units Oral TID WC   multivitamin with minerals  1 tablet Oral Daily   nortriptyline  30 mg Oral QHS   pantoprazole  40 mg Oral Daily   silver sulfADIAZINE   Topical BID   zinc sulfate  220 mg Oral Daily   Continuous Infusions:   ceFAZolin (ANCEF) IV 1 g (01/10/22 0901)   lactated ringers     sodium phosphate 30 mmol in dextrose 5 % 250 mL infusion 30 mmol (01/10/22 0958)   PRN Meds:.ondansetron (ZOFRAN) IV, oxyCODONE, polyethylene glycol, senna-docusate, tiZANidine  Antimicrobials: Anti-infectives (From admission, onward)    Start     Dose/Rate Route Frequency Ordered Stop   01/09/22 1800  ceFAZolin (ANCEF) IVPB 1 g/50 mL premix        1 g 100 mL/hr over 30 Minutes Intravenous Every 8 hours 01/09/22 1513 01/16/22 1759   01/06/22 2030  cefTRIAXone (ROCEPHIN) 2 g in sodium chloride 0.9 % 100 mL IVPB  Status:  Discontinued       See Hyperspace for  full Linked Orders Report.   2 g 200 mL/hr over 30 Minutes Intravenous Every 24 hours 01/06/22 2017 01/09/22 1513   01/06/22 2030  metroNIDAZOLE (FLAGYL) IVPB 500 mg  Status:  Discontinued       See Hyperspace for full Linked Orders Report.   500 mg 100 mL/hr over 60 Minutes Intravenous Every 12 hours 01/06/22 2017 01/09/22 1513        I have personally reviewed the following labs and images: CBC: Recent Labs  Lab 01/06/22 2025 01/07/22 1154 01/09/22 0016 01/09/22 0955 01/10/22 0401  WBC 10.3 10.0 11.3* 9.4 11.5*  NEUTROABS 8.1* 8.1*  --   --   --   HGB 10.9* 13.8 10.2* 10.0* 9.8*  HCT 32.5* 41.8 29.8* 30.7* 29.3*  MCV 92.3 93.7 92.0 95.9 95.4  PLT 180 192 158 136* 132*    BMP &GFR Recent Labs  Lab 01/06/22 2025 01/06/22 2142 01/07/22 1155 01/08/22 1926 01/09/22 0955 01/10/22 0401  NA 134*  --  136 135 134* 131*  K 2.1*  --  3.2* 3.5 3.3* 4.3  CL 96*  --  99 106 105 106  CO2 28  --  _0 GLUCOSE 127*  --  108* 154* 182* 148*  BUN 23  --  _1 CREATININE 1.18*  --  0.99 0.89 0.78 0.67  CALCIUM 7.2*  --  7.6* 7.1* 7.0* 7.0*  MG  --  1.5* 2.1 1.6* 1.5* 1.7  PHOS  --   --   --   --  1.1* 1.7*   Estimated Creatinine Clearance: 56 mL/min (by C-G formula based on SCr of 0.67 mg/dL). Liver & Pancreas: Recent Labs  Lab 01/07/22 1155 01/08/22 1926 01/09/22 0955 01/10/22 0401  AST 77* 48* 41  --   ALT 44 32 31  --   ALKPHOS 147* 130* 128*  --   BILITOT 1.0 1.0 0.8  --   PROT 5.7* 4.8* 4.8*  --   ALBUMIN 2.1* 1.7* 1.6* 1.5*   No results for input(s): "LIPASE", "AMYLASE" in the last 168 hours. No results for input(s): "AMMONIA" in the last 168 hours. Diabetic: No results for input(s): "HGBA1C" in the last 72 hours.  No results for input(s): "GLUCAP" in the last 168 hours. Cardiac Enzymes: Recent Labs  Lab 01/08/22 1926  CKTOTAL 64   No results for input(s): "PROBNP" in the last 8760 hours. Coagulation Profile: No results for input(s): "INR", "PROTIME" in the last 168 hours. Thyroid Function Tests: Recent Labs    01/10/22 0401  TSH 10.926*   Lipid Profile: No results for input(s): "CHOL", "HDL", "LDLCALC", "TRIG", "CHOLHDL", "LDLDIRECT" in the last 72 hours. Anemia Panel: Recent Labs    01/09/22 0956 01/10/22 0401  VITAMINB12 1,557* 1,325*  FOLATE  --  5.9*  FERRITIN  --  236  TIBC  --  98*  IRON  --  58  RETICCTPCT  --  1.7   Urine analysis:    Component Value Date/Time   COLORURINE YELLOW 09/30/2017 [place] 09/30/2017 1449   LABSPEC <1.005 (L) 09/30/2017 1449   PHURINE 6.5 09/30/2017 1449   GLUCOSEU NEGATIVE 09/30/2017 1449   HGBUR NEGATIVE 09/30/2017 1449   BILIRUBINUR NEGATIVE  09/30/2017 1449   KETONESUR NEGATIVE 09/30/2017 1449   PROTEINUR NEGATIVE 09/30/2017 1449   UROBILINOGEN 4.0 (H) 06/07/2011 0007   NITRITE NEGATIVE 09/30/2017 1449   LEUKOCYTESUR TRACE (A) 09/30/2017 1449   Sepsis Labs: Invalid input(s): "PROCALCITONIN", "LACTICIDVEN"  Microbiology: Recent Results (from the past 240 hour(s))  Culture, blood (routine x 2)     Status: None (Preliminary result)   Collection Time: 01/06/22  8:25 PM   Specimen: BLOOD  Result Value Ref Range Status   Specimen Description   Final    BLOOD RIGHT ANTECUBITAL Performed at [facility], [address] [place], [place] [address]    Special Requests   Final    BOTTLES DRAWN AEROBIC AND ANAEROBIC Blood Culture adequate volume Performed at [facility], [address] [place], [place] [address]    Culture   Final    NO GROWTH 3 DAYS Performed at [facility], [place] [address], [place], [place] [address]    Report Status PENDING  Incomplete  Culture, blood (routine x 2)     Status: None (Preliminary result)   Collection Time: 01/07/22  6:51 PM   Specimen: BLOOD  Result Value Ref Range Status   Specimen Description   Final    BLOOD SITE NOT SPECIFIED Performed at [facility] [address] [place], [place] [address]    Special Requests   Final    BOTTLES DRAWN AEROBIC ONLY Blood Culture adequate volume Performed at [facility] [address] [place], [place] [address]    Culture   Final    NO GROWTH 2 DAYS Performed at [facility] [address], [place], [place] [address]    Report Status PENDING  Incomplete  Culture, blood (Routine X 2) w Reflex to ID Panel     Status: None (Preliminary result)   Collection Time: 01/07/22  6:51 PM   Specimen: BLOOD  Result Value Ref Range Status   Specimen Description   Final    BLOOD SITE NOT SPECIFIED Performed at [facility] [address] [place], [place]  [address]    Special Requests   Final    BOTTLES DRAWN AEROBIC ONLY Blood Culture adequate volume Performed at [facility] [address] [place], [place] [address]    Culture   Final    NO GROWTH < 12 HOURS Performed at [facility] [address], [place], [place] [address]    Report Status PENDING  Incomplete    Radiology Studies: DG Abd Portable 1V  Result Date: 01/10/2022 CLINICAL DATA:  Abdominal discomfort for 3 days. No bowel movement. EXAM: PORTABLE ABDOMEN - 1 VIEW COMPARISON:  Recent renal ultrasound reviewed. FINDINGS: There is increased air throughout nondilated small bowel in the central abdomen. Air within the ascending and transverse colon that is not abnormally dilated. No significant formed stool in the colon. Multiple surgical clips and enteric sutures in the upper abdomen. No evidence of free air in the supine views. No visible radiopaque calculi. Right pleural effusion is at least moderate in size, partially included in the field of view. IMPRESSION: 1. Increased air throughout nondilated small bowel in the central abdomen, suggesting generalized ileus. No significant formed stool in the colon. 2. Right pleural effusion is at least moderate in size, partially included in the field of view. Electronically Signed   By: Keith Rake M.D.   On: 01/10/2022 10:39      Marcellino Fidalgo [place]  If 7PM-7AM, please contact night-coverage [email or web address] 01/10/2022, 11:55 AM

## 2022-01-10 NOTE — Progress Notes (Signed)
Patient has not voided this shift, bladder scan performed, 216 mls in bladder, MD notified.  Will rescan in 2 hours.

## 2022-01-10 NOTE — Progress Notes (Signed)
Bladder scan performed, read as 251 mls.  Patient continues with no urge to void, no abdominal pain.  MD notified, will continue to monitor.

## 2022-01-11 DIAGNOSIS — G894 Chronic pain syndrome: Secondary | ICD-10-CM | POA: Diagnosis not present

## 2022-01-11 DIAGNOSIS — E8809 Other disorders of plasma-protein metabolism, not elsewhere classified: Secondary | ICD-10-CM

## 2022-01-11 DIAGNOSIS — J9601 Acute respiratory failure with hypoxia: Secondary | ICD-10-CM

## 2022-01-11 DIAGNOSIS — L03115 Cellulitis of right lower limb: Secondary | ICD-10-CM | POA: Diagnosis not present

## 2022-01-11 DIAGNOSIS — K860 Alcohol-induced chronic pancreatitis: Secondary | ICD-10-CM | POA: Diagnosis not present

## 2022-01-11 DIAGNOSIS — N179 Acute kidney failure, unspecified: Secondary | ICD-10-CM | POA: Diagnosis not present

## 2022-01-11 LAB — RENAL FUNCTION PANEL
Albumin: <1.5 g/dL — ABNORMAL LOW (ref 3.5–5.0)
Anion gap: 3 — ABNORMAL LOW (ref 5–15)
BUN: 8 mg/dL (ref 8–23)
CO2: 24 mmol/L (ref 22–32)
Calcium: 6.9 mg/dL — ABNORMAL LOW (ref 8.9–10.3)
Chloride: 104 mmol/L (ref 98–111)
Creatinine, Ser: 0.67 mg/dL (ref 0.44–1.00)
GFR, Estimated: >60 mL/min (ref >60)
Glucose, Bld: 151 mg/dL — ABNORMAL HIGH (ref 70–99)
Phosphorus: 2.2 mg/dL — ABNORMAL LOW (ref 2.5–4.6)
Potassium: 4.2 mmol/L (ref 3.5–5.1)
Sodium: 131 mmol/L — ABNORMAL LOW (ref 135–145)

## 2022-01-11 LAB — CULTURE, BLOOD (ROUTINE X 2)
Culture: NO GROWTH
Special Requests: ADEQUATE

## 2022-01-11 LAB — LACTATE DEHYDROGENASE: LDH: 202 U/L — ABNORMAL HIGH (ref 98–192)

## 2022-01-11 LAB — CBC
HCT: 25.2 % — ABNORMAL LOW (ref 36.0–46.0)
Hemoglobin: 8.5 g/dL — ABNORMAL LOW (ref 12.0–15.0)
MCH: 32 pg (ref 26.0–34.0)
MCHC: 33.7 g/dL (ref 30.0–36.0)
MCV: 94.7 fL (ref 80.0–100.0)
Platelets: 108 10*3/uL — ABNORMAL LOW (ref 150–400)
RBC: 2.66 MIL/uL — ABNORMAL LOW (ref 3.87–5.11)
RDW: 14.8 % (ref 11.5–15.5)
WBC: 8.9 10*3/uL (ref 4.0–10.5)
nRBC: 0 % (ref 0.0–0.2)

## 2022-01-11 LAB — BRAIN NATRIURETIC PEPTIDE: B Natriuretic Peptide: 256.2 pg/mL — ABNORMAL HIGH (ref 0.0–100.0)

## 2022-01-11 LAB — MAGNESIUM: Magnesium: 1.6 mg/dL — ABNORMAL LOW (ref 1.7–2.4)

## 2022-01-11 LAB — PROTEIN, TOTAL: Total Protein: 3.9 g/dL — ABNORMAL LOW (ref 6.5–8.1)

## 2022-01-11 MED ORDER — MAGNESIUM SULFATE 2 GM/50ML IV SOLN
2.0000 g | Freq: Once | INTRAVENOUS | Status: AC
  Administered 2022-01-11: 2 g via INTRAVENOUS
  Filled 2022-01-11: qty 50

## 2022-01-11 NOTE — Progress Notes (Signed)
PT Cancellation Note  Patient Details Name: Carolyn [name] MRN: [id number] DOB: 06-14-1955   Cancelled Treatment:    Reason Eval/Treat Not Completed:  Attempting PT tx session-pt currently eating. Will check back another day.    [facility]  Office: [phone]

## 2022-01-11 NOTE — NC FL2 (Cosign Needed)
[place] LEVEL OF CARE SCREENING TOOL     IDENTIFICATION  Patient Name: Carolyn [name] Birthdate: 1955-11-09 Sex: female Admission Date (Current Location): 01/06/2022  [facility] and [place] Number:  Herbalist and Address:  [facility],  [place], [place]      Provider Number: [id number]  Attending Physician Name and Address:  Mercy Riding, MD  Relative Name and Phone Number:  Artis Flock   [phone]    Current Level of Care: Hospital Recommended Level of Care: [place] Prior Approval Number:    Date Approved/Denied:   PASRR Number: [id number]  Discharge Plan: SNF    Current Diagnoses: Patient Active Problem List   Diagnosis Date Noted   Acute respiratory failure with hypoxia ([place]) 01/11/2022   Hypoalbuminemia 01/11/2022   Pleural effusion 01/10/2022   Abdominal distention 01/10/2022   Physical deconditioning 01/09/2022   Pressure injury of skin 01/08/2022   Hypokalemia 01/07/2022   Hypomagnesemia 01/07/2022   AKI (acute kidney injury) ([place]) 01/07/2022   Protein-calorie malnutrition, severe ([place]) 01/07/2022   Cellulitis of right foot 01/07/2022   Normocytic anemia 01/07/2022   Osteomyelitis ([place]) 01/06/2022   Intractable nausea and vomiting 66/44/0347   Metabolic acidosis, increased anion gap 06/12/2020   Seizure ([place]) 05/08/2017   Chronic pain syndrome 05/08/2017   Gastroesophageal reflux disease without esophagitis 05/08/2017   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[place]) 05/08/2017   Pancreatic steatorrhea 05/08/2017   Osteoporosis 03/16/2011   Endometriosis    Malabsorption    Chronic pancreatitis (HCC)    Vitamin D deficiency     Orientation RESPIRATION BLADDER Height & Weight     Self, Time, Situation, Place  O2 (2L) Incontinent Weight: 110 lb (49.9 kg) Height:  5\' 8"  (172.7 cm)  BEHAVIORAL SYMPTOMS/MOOD NEUROLOGICAL BOWEL NUTRITION STATUS      Incontinent Diet  AMBULATORY  STATUS COMMUNICATION OF NEEDS Skin   Limited Assist Verbally PU Stage and Appropriate Care PU Stage 1 Dressing:  (PRN dressing change)                     Personal Care Assistance Level of Assistance  Bathing, Dressing, Feeding Bathing Assistance: Limited assistance Feeding assistance: Independent Dressing Assistance: Limited assistance     Functional Limitations Info  Sight, Hearing, Speech Sight Info: Adequate Hearing Info: Adequate Speech Info: Adequate    SPECIAL CARE FACTORS FREQUENCY  PT (By licensed PT), OT (By licensed OT)     PT Frequency: Minimum 5x a week OT Frequency: Minimum 5x a week            Contractures Contractures Info: Not present    Additional Factors Info  Code Status, Allergies Code Status Info: Full code Allergies Info: Peanut-containing Drug Products           Current Medications (01/11/2022):  This is the current hospital active medication list Current Facility-Administered Medications  Medication Dose Route Frequency Provider Last Rate Last Admin   ascorbic acid (VITAMIN C) tablet 500 mg  500 mg Oral BID Wendee Beavers T, MD   500 mg at 01/11/22 1114   buprenorphine (BUTRANS) 5 MCG/HR 4 patch  4 patch Transdermal Weekly Wendee Beavers T, MD   4 patch at 01/09/22 1036   ceFAZolin (ANCEF) IVPB 1 g/50 mL premix  1 g Intravenous Q8H Gonfa, Taye T, MD 100 mL/hr at 01/11/22 1120 1 g at 01/11/22 1120   docusate sodium (COLACE) capsule 100 mg  100 mg Oral BID  Marylyn Ishihara, Tyrone A, DO   100 mg at 01/11/22 1114   enoxaparin (LOVENOX) injection 40 mg  40 mg Subcutaneous Q24H Kyle, Tyrone A, DO   40 mg at 01/10/22 2043   feeding supplement (ENSURE ENLIVE / ENSURE PLUS) liquid 237 mL  237 mL Oral TID BM Gonfa, Taye T, MD   237 mL at 01/11/22 1115   lipase/protease/amylase (CREON) capsule 24,000 Units  24,000 Units Oral TID WC Mercy Riding, MD   24,000 Units at 01/11/22 1629   multivitamin with minerals tablet 1 tablet  1 tablet Oral Daily Mercy Riding, MD    1 tablet at 01/11/22 1114   nortriptyline (PAMELOR) capsule 30 mg  30 mg Oral QHS Wendee Beavers T, MD   30 mg at 01/10/22 2044   ondansetron (ZOFRAN) injection 4 mg  4 mg Intravenous Q8H PRN Marylyn Ishihara, Tyrone A, DO   4 mg at 01/09/22 1045   oxyCODONE (Oxy IR/ROXICODONE) immediate release tablet 5 mg  5 mg Oral Q8H PRN Wendee Beavers T, MD   5 mg at 01/11/22 1629   pantoprazole (PROTONIX) EC tablet 40 mg  40 mg Oral Daily Kyle, Tyrone A, DO   40 mg at 01/11/22 1114   polyethylene glycol (MIRALAX / GLYCOLAX) packet 17 g  17 g Oral BID PRN Gonfa, Taye T, MD       senna-docusate (Senokot-S) tablet 1 tablet  1 tablet Oral BID PRN Mercy Riding, MD       silver sulfADIAZINE (SILVADENE) 1 % cream   Topical BID Raenette Rover, NP   Given at 01/11/22 1114   tiZANidine (ZANAFLEX) tablet 2 mg  2 mg Oral Q8H PRN Wendee Beavers T, MD   2 mg at 01/11/22 0231   zinc sulfate capsule 220 mg  220 mg Oral Daily Wendee Beavers T, MD   220 mg at 01/11/22 1114     Discharge Medications: Please see discharge summary for a list of discharge medications.  Relevant Imaging Results:  Relevant Lab Results:   Additional Information SSN [id number]  Ross Ludwig, LCSW

## 2022-01-11 NOTE — Progress Notes (Addendum)
Responded to consult for IV. Noted documentation of previous infiltration; noted documentation of receiving magnesium and potassium infusion. No further orders for either medication at this time. Discussed risk/benefit of midline with pt. Pt declines midline at this time due to potential for new magnesium/potassium orders if lab levels decrease again. Recommend considering PICC/CVC if these medications are needed and due to limited veins and prior infiltrations affecting both forearms. RN notified.

## 2022-01-11 NOTE — Progress Notes (Signed)
PROGRESS NOTE  Carolyn [name] NUU:[id number] DOB: [birth year]   PCP: London Pepper, MD  Patient is from: Home  DOA: 01/06/2022 LOS: 4  Chief complaints Chief Complaint  Patient presents with   Foot Wound     Brief Narrative / Interim history: 66 year old F with PMH of chronic alcoholic pancreatitis with chronic pain and pancreatic insufficiency, GERD, severe malnutrition and osteoporosis presenting with right foot wound that has initially started as a blister/bulla due to poorly fitted boots when she went on trip.  Right foot x-ray raise concern for acute osteomyelitis but this was excluded by MRI of right foot.  Is being treated for cellulitis.  Evaluated by podiatry who recommended antibiotic for 7 days, wound care and outpatient follow-up.  Patient had KUB as evaluation for abdominal fullness on 10/29 that showed pleural effusion.  CXR confirmed large right pleural effusion.  IR thoracocentesis requested on 10/30.  Therapy recommended SNF    Subjective: Seen and examined earlier this morning.  No major events overnight of this morning.  Continues to endorse pain in both legs.  Said pain 7/10.  Oxygen requirement and dry cough.  CXR showed large right pleural effusion.  Unclear cause of this.  Has no history of CHF.  She has malnutrition with very low albumin.  Objective: Vitals:   01/10/22 2156 01/11/22 0455 01/11/22 0649 01/11/22 1306  BP: 122/79 127/88 110/78 96/62  Pulse: (!) 105 (!) 120 (!) 102 91  Resp: _0 (!) 24  Temp: 98.8 F (37.1 C) 98.8 F (37.1 C) 98.4 F (36.9 C) 98.1 F (36.7 C)  TempSrc: Oral Oral Oral Oral  SpO2: 93% (!) 88% 95% 94%  Weight:      Height:        Examination:  GENERAL: Appears frail. HEENT: MMM.  Vision and hearing grossly intact.  NECK: Supple.  No apparent JVD.  RESP:  No IWOB.  Fair aeration but diminished on the right. CVS:  RRR. Heart sounds normal.  ABD/GI/GU: BS+. Abd soft, NTND.  MSK/EXT:  Moves extremities.   Significant muscle mass and subcu fat loss. SKIN: Superficial skin lesion in both feet.  NEURO: Awake and alert. Oriented appropriately.  No apparent focal neuro deficit. PSYCH: Calm. Normal affect.     Procedures:  None  Microbiology summarized: Blood cultures NGTD  Assessment and plan: Principal Problem:   Osteomyelitis ([place]) Active Problems:   Chronic pancreatitis ([facility])   Hypokalemia   Hypomagnesemia   AKI (acute kidney injury) ([place])   Protein-calorie malnutrition, severe ([facility])   Chronic pain syndrome   Cellulitis of right foot   Normocytic anemia   Gastroesophageal reflux disease without esophagitis   Pressure injury of skin   Physical deconditioning   Pleural effusion   Abdominal distention  Bilateral feet cellulitis/ulceration Bilateral second toe deformity -Right foot x-ray raise concern for cellulitis and osteomyelitis but negative on MRI -Left foot x-ray suspicious for cellulitis but no osteo -No leukocytosis.  CRP 1.3.  ESR 15. -Patient is not diabetic.  Not a smoker. -No plan for surgery.  Podiatry recommends 7 days of antibiotic and wound care -De-escalated antibiotic to Ancef-last day of antibiotics 10/4. -Pain control -Optimize nutrition.  Appreciate help by dietitian.  Acute respiratory failure with hypoxia due to right pleural effusion and atelectasis: CXR shows large right pleural effusion.  She has no history of CHF.  Albumin is very low. -Check BMP -IR consulted for thoracocentesis -Send pleural fluid analysis -Check serum protein and LDH for comparison -Discontinue  IV fluid -Encourage incentive spirometry -Wean oxygen as able  AKI: Resolved but not making much urine. Recent Labs    01/06/22 2025 01/07/22 1155 01/08/22 1926 01/09/22 0955 01/10/22 0401 01/11/22 0840  BUN _0 CREATININE 1.18* 0.99 0.89 0.78 0.67 0.67  -Discontinue IV fluid -Monitor intake and output  Abdominal distention: KUB raises concern for ileus.   Reports passing gas and small bowel movement -Minimize opiate.  Fentanyl discontinued and oxycodone reduced. -Continue home Butrans -Continue bowel regimen  Hypokalemia/hypomagnesemia/hyponatremia: Refeeding syndrome?  Improved. -Monitor replenish as appropriate   Chronic alcoholic pancreatitis/pancreatic insufficiency/chronic pain: Sober for over a year. -Pain control-resume home Butrans.  Continue fentanyl as needed -Continue home Creon -Consult dietitian  Lactic acidosis: Likely type B. -Continue monitoring   Normocytic anemia: H&H relatively stable.  Anemia panel suggests anemia of chronic disease. Recent Labs    01/06/22 2025 01/07/22 1154 01/09/22 0016 01/09/22 0955 01/10/22 0401 01/11/22 0840  HGB 10.9* 13.8 10.2* 10.0* 9.8* 8.5*  -Monitor H&H  Mild sinus tachycardia: Could be due to anemia, deconditioning -Optimize electrolytes  GERD  - PPI  Vitamin D insufficiency -Supplement  Physical deconditioning/ambulatory dysfunction -PT/OT-recommended SNF  Severe protein caloric malnutrition Body mass index is 16.73 kg/m. Nutrition Problem: Severe Malnutrition Etiology: chronic illness (chronic pancreatitis, h/o multiple gastric surgeries including gastric bypass) Signs/Symptoms: severe fat depletion, severe muscle depletion, energy intake < or equal to 75% for > or equal to 1 month Interventions: Ensure Enlive (each supplement provides 350kcal and 20 grams of protein), MVI, Education  Pressure skin injury: POA Pressure Injury 01/07/22 Coccyx Mid Stage 1 -  Intact skin with non-blanchable redness of a localized area usually over a bony prominence. (Active)  01/07/22 1848  Location: Coccyx  Location Orientation: Mid  Staging: Stage 1 -  Intact skin with non-blanchable redness of a localized area usually over a bony prominence.  Wound Description (Comments):   Present on Admission: Yes  Dressing Type Foam - Lift dressing to assess site every shift 01/10/22 2328    DVT prophylaxis:  enoxaparin (LOVENOX) injection 40 mg Start: 01/07/22 2000  Code Status: Full code Family Communication: None at bedside Level of care: Telemetry Status is: Inpatient Remains inpatient appropriate because: Acute respiratory failure with hypoxia due to right pleural effusion   Final disposition: SNF Consultants:  Podiatry-signed off  Sch Meds:  Scheduled Meds:  ascorbic acid  500 mg Oral BID   buprenorphine  4 patch Transdermal Weekly   docusate sodium  100 mg Oral BID   enoxaparin (LOVENOX) injection  40 mg Subcutaneous Q24H   feeding supplement  237 mL Oral TID BM   lipase/protease/amylase  24,000 Units Oral TID WC   multivitamin with minerals  1 tablet Oral Daily   nortriptyline  30 mg Oral QHS   pantoprazole  40 mg Oral Daily   silver sulfADIAZINE   Topical BID   zinc sulfate  220 mg Oral Daily   Continuous Infusions:   ceFAZolin (ANCEF) IV 1 g (01/11/22 1120)   magnesium sulfate bolus IVPB     PRN Meds:.ondansetron (ZOFRAN) IV, oxyCODONE, polyethylene glycol, senna-docusate, tiZANidine  Antimicrobials: Anti-infectives (From admission, onward)    Start     Dose/Rate Route Frequency Ordered Stop   01/09/22 1800  ceFAZolin (ANCEF) IVPB 1 g/50 mL premix        1 g 100 mL/hr over 30 Minutes Intravenous Every 8 hours 01/09/22 1513 01/16/22 1759   01/06/22 2030  cefTRIAXone (ROCEPHIN) 2 g  in sodium chloride 0.9 % 100 mL IVPB  Status:  Discontinued       See Hyperspace for full Linked Orders Report.   2 g 200 mL/hr over 30 Minutes Intravenous Every 24 hours 01/06/22 2017 01/09/22 1513   01/06/22 2030  metroNIDAZOLE (FLAGYL) IVPB 500 mg  Status:  Discontinued       See Hyperspace for full Linked Orders Report.   500 mg 100 mL/hr over 60 Minutes Intravenous Every 12 hours 01/06/22 2017 01/09/22 1513        I have personally reviewed the following labs and images: CBC: Recent Labs  Lab 01/06/22 2025 01/07/22 1154 01/09/22 0016 01/09/22 0955  01/10/22 0401 01/11/22 0840  WBC 10.3 10.0 11.3* 9.4 11.5* 8.9  NEUTROABS 8.1* 8.1*  --   --   --   --   HGB 10.9* 13.8 10.2* 10.0* 9.8* 8.5*  HCT 32.5* 41.8 29.8* 30.7* 29.3* 25.2*  MCV 92.3 93.7 92.0 95.9 95.4 94.7  PLT 180 192 158 136* 132* 108*   BMP &GFR Recent Labs  Lab 01/07/22 1155 01/08/22 1926 01/09/22 0955 01/10/22 0401 01/11/22 0840  NA 136 135 134* 131* 131*  K 3.2* 3.5 3.3* 4.3 4.2  CL 99 106 105 106 104  CO2 _0 GLUCOSE 108* 154* 182* 148* 151*  BUN _1 CREATININE 0.99 0.89 0.78 0.67 0.67  CALCIUM 7.6* 7.1* 7.0* 7.0* 6.9*  MG 2.1 1.6* 1.5* 1.7 1.6*  PHOS  --   --  1.1* 1.7* 2.2*   Estimated Creatinine Clearance: 56 mL/min (by C-G formula based on SCr of 0.67 mg/dL). Liver & Pancreas: Recent Labs  Lab 01/07/22 1155 01/08/22 1926 01/09/22 0955 01/10/22 0401 01/11/22 0840  AST 77* 48* 41  --   --   ALT 44 32 31  --   --   ALKPHOS 147* 130* 128*  --   --   BILITOT 1.0 1.0 0.8  --   --   PROT 5.7* 4.8* 4.8*  --  3.9*  ALBUMIN 2.1* 1.7* 1.6* 1.5* <1.5*   No results for input(s): "LIPASE", "AMYLASE" in the last 168 hours. No results for input(s): "AMMONIA" in the last 168 hours. Diabetic: No results for input(s): "HGBA1C" in the last 72 hours.  No results for input(s): "GLUCAP" in the last 168 hours. Cardiac Enzymes: Recent Labs  Lab 01/08/22 1926  CKTOTAL 64   No results for input(s): "PROBNP" in the last 8760 hours. Coagulation Profile: No results for input(s): "INR", "PROTIME" in the last 168 hours. Thyroid Function Tests: Recent Labs    01/10/22 0401  TSH 10.926*   Lipid Profile: No results for input(s): "CHOL", "HDL", "LDLCALC", "TRIG", "CHOLHDL", "LDLDIRECT" in the last 72 hours. Anemia Panel: Recent Labs    01/09/22 0956 01/10/22 0401  VITAMINB12 1,557* 1,325*  FOLATE  --  5.9*  FERRITIN  --  236  TIBC  --  98*  IRON  --  58  RETICCTPCT  --  1.7   Urine analysis:    Component Value Date/Time    COLORURINE YELLOW 09/30/2017 [place] 09/30/2017 1449   LABSPEC <1.005 (L) 09/30/2017 1449   PHURINE 6.5 09/30/2017 1449   GLUCOSEU NEGATIVE 09/30/2017 1449   HGBUR NEGATIVE 09/30/2017 1449   BILIRUBINUR NEGATIVE 09/30/2017 1449   KETONESUR NEGATIVE 09/30/2017 1449   PROTEINUR NEGATIVE 09/30/2017 1449   UROBILINOGEN 4.0 (H) 06/07/2011 0007   NITRITE NEGATIVE 09/30/2017 1449  LEUKOCYTESUR TRACE (A) 09/30/2017 1449   Sepsis Labs: Invalid input(s): "PROCALCITONIN", "LACTICIDVEN"  Microbiology: Recent Results (from the past 240 hour(s))  Culture, blood (routine x 2)     Status: None   Collection Time: 01/06/22  8:25 PM   Specimen: BLOOD  Result Value Ref Range Status   Specimen Description   Final    BLOOD RIGHT ANTECUBITAL Performed at [facility], [address] [place], [place] [address]    Special Requests   Final    BOTTLES DRAWN AEROBIC AND ANAEROBIC Blood Culture adequate volume Performed at [facility], [address] [place], [place] [address]    Culture   Final    NO GROWTH 5 DAYS Performed at [facility], [place] [address], [place], [place] [address]    Report Status 01/11/2022 FINAL  Final  Culture, blood (routine x 2)     Status: None (Preliminary result)   Collection Time: 01/07/22  6:51 PM   Specimen: BLOOD  Result Value Ref Range Status   Specimen Description   Final    BLOOD SITE NOT SPECIFIED Performed at [facility] [address] [place], [place] [address]    Special Requests   Final    BOTTLES DRAWN AEROBIC ONLY Blood Culture adequate volume Performed at [facility] [address], [place], [place] [address]    Culture   Final    NO GROWTH 4 DAYS Performed at [facility], [place] [address], [place], [place] [address]    Report Status PENDING  Incomplete  Culture, blood (Routine X 2) w Reflex to ID Panel     Status: None (Preliminary result)   Collection  Time: 01/07/22  6:51 PM   Specimen: BLOOD  Result Value Ref Range Status   Specimen Description   Final    BLOOD SITE NOT SPECIFIED Performed at [facility] [address], [place], [place] [address]    Special Requests   Final    BOTTLES DRAWN AEROBIC ONLY Blood Culture adequate volume Performed at [facility] [address] [place], [place] [address]    Culture   Final    NO GROWTH 3 DAYS Performed at [facility], [place] [address], [place],  [address]    Report Status PENDING  Incomplete    Radiology Studies: No results found.    Chrys Landgrebe T. [place]  If 7PM-7AM, please contact night-coverage [email or web address] 01/11/2022, 1:21 PM

## 2022-01-12 ENCOUNTER — Inpatient Hospital Stay: Payer: Self-pay

## 2022-01-12 ENCOUNTER — Inpatient Hospital Stay (HOSPITAL_COMMUNITY): Payer: Medicare PPO

## 2022-01-12 ENCOUNTER — Other Ambulatory Visit (HOSPITAL_COMMUNITY): Payer: Medicare PPO

## 2022-01-12 DIAGNOSIS — R601 Generalized edema: Secondary | ICD-10-CM

## 2022-01-12 DIAGNOSIS — N179 Acute kidney failure, unspecified: Secondary | ICD-10-CM | POA: Diagnosis not present

## 2022-01-12 DIAGNOSIS — G894 Chronic pain syndrome: Secondary | ICD-10-CM | POA: Diagnosis not present

## 2022-01-12 DIAGNOSIS — D649 Anemia, unspecified: Secondary | ICD-10-CM

## 2022-01-12 DIAGNOSIS — L03115 Cellulitis of right lower limb: Secondary | ICD-10-CM | POA: Diagnosis not present

## 2022-01-12 DIAGNOSIS — K860 Alcohol-induced chronic pancreatitis: Secondary | ICD-10-CM | POA: Diagnosis not present

## 2022-01-12 LAB — CBC
HCT: 32.7 % — ABNORMAL LOW (ref 36.0–46.0)
Hemoglobin: 10.8 g/dL — ABNORMAL LOW (ref 12.0–15.0)
MCH: 31.5 pg (ref 26.0–34.0)
MCHC: 33 g/dL (ref 30.0–36.0)
MCV: 95.3 fL (ref 80.0–100.0)
Platelets: 129 10*3/uL — ABNORMAL LOW (ref 150–400)
RBC: 3.43 MIL/uL — ABNORMAL LOW (ref 3.87–5.11)
RDW: 14.8 % (ref 11.5–15.5)
WBC: 11.9 10*3/uL — ABNORMAL HIGH (ref 4.0–10.5)
nRBC: 0 % (ref 0.0–0.2)

## 2022-01-12 LAB — CULTURE, BLOOD (ROUTINE X 2)
Culture: NO GROWTH
Culture: NO GROWTH
Special Requests: ADEQUATE
Special Requests: ADEQUATE

## 2022-01-12 LAB — RENAL FUNCTION PANEL
Albumin: <1.5 g/dL — ABNORMAL LOW (ref 3.5–5.0)
Anion gap: 6 (ref 5–15)
BUN: 8 mg/dL (ref 8–23)
CO2: 23 mmol/L (ref 22–32)
Calcium: 7.3 mg/dL — ABNORMAL LOW (ref 8.9–10.3)
Chloride: 102 mmol/L (ref 98–111)
Creatinine, Ser: 0.75 mg/dL (ref 0.44–1.00)
GFR, Estimated: >60 mL/min (ref >60)
Glucose, Bld: 130 mg/dL — ABNORMAL HIGH (ref 70–99)
Phosphorus: 1.8 mg/dL — ABNORMAL LOW (ref 2.5–4.6)
Potassium: 4.3 mmol/L (ref 3.5–5.1)
Sodium: 131 mmol/L — ABNORMAL LOW (ref 135–145)

## 2022-01-12 LAB — BODY FLUID CELL COUNT WITH DIFFERENTIAL
Eos, Fluid: 0 %
Lymphs, Fluid: 11 %
Monocyte-Macrophage-Serous Fluid: 38 % — ABNORMAL LOW (ref 50–90)
Neutrophil Count, Fluid: 51 % — ABNORMAL HIGH (ref 0–25)
Total Nucleated Cell Count, Fluid: 62 cu mm (ref 0–1000)

## 2022-01-12 LAB — TSH: TSH: 13.153 u[IU]/mL — ABNORMAL HIGH (ref 0.350–4.500)

## 2022-01-12 LAB — PROTEIN, PLEURAL OR PERITONEAL FLUID: Total protein, fluid: <3 g/dL

## 2022-01-12 LAB — T4, FREE: Free T4: 0.85 ng/dL (ref 0.61–1.12)

## 2022-01-12 LAB — LACTATE DEHYDROGENASE, PLEURAL OR PERITONEAL FLUID: LD, Fluid: 29 U/L — ABNORMAL HIGH (ref 3–23)

## 2022-01-12 LAB — MAGNESIUM: Magnesium: 2 mg/dL (ref 1.7–2.4)

## 2022-01-12 MED ORDER — LIDOCAINE HCL 1 % IJ SOLN
INTRAMUSCULAR | Status: AC
  Administered 2022-01-12: 15 mL
  Filled 2022-01-12: qty 20

## 2022-01-12 MED ORDER — ALBUMIN HUMAN 25 % IV SOLN
25.0000 g | Freq: Every day | INTRAVENOUS | Status: DC
  Administered 2022-01-12 – 2022-01-16 (×5): 25 g via INTRAVENOUS
  Filled 2022-01-12 (×6): qty 100

## 2022-01-12 MED ORDER — FUROSEMIDE 10 MG/ML IJ SOLN
40.0000 mg | Freq: Every day | INTRAMUSCULAR | Status: DC
  Administered 2022-01-12 – 2022-01-15 (×4): 40 mg via INTRAVENOUS
  Filled 2022-01-12 (×5): qty 4

## 2022-01-12 MED ORDER — SODIUM CHLORIDE 0.9% FLUSH
10.0000 mL | Freq: Two times a day (BID) | INTRAVENOUS | Status: DC
  Administered 2022-01-12 – 2022-01-25 (×14): 10 mL

## 2022-01-12 MED ORDER — SODIUM CHLORIDE 0.9% FLUSH
10.0000 mL | INTRAVENOUS | Status: DC | PRN
  Administered 2022-01-13 – 2022-01-19 (×2): 10 mL
  Administered 2022-01-19: 20 mL

## 2022-01-12 MED ORDER — CHLORHEXIDINE GLUCONATE CLOTH 2 % EX PADS
6.0000 | MEDICATED_PAD | Freq: Every day | CUTANEOUS | Status: DC
  Administered 2022-01-12 – 2022-01-25 (×13): 6 via TOPICAL

## 2022-01-12 NOTE — Progress Notes (Signed)
PROGRESS NOTE  Carolyn [name] KNL:[id number] DOB: [birth year]   PCP: London Pepper, MD  Patient is from: Home  DOA: 01/06/2022 LOS: 5  Chief complaints Chief Complaint  Patient presents with   Foot Wound     Brief Narrative / Interim history: 66 year old F with PMH of chronic alcoholic pancreatitis with chronic pain and pancreatic insufficiency, GERD, severe malnutrition and osteoporosis presenting with right foot wound that has initially started as a blister/bulla due to poorly fitted boots when she went on trip.  Right foot x-ray raise concern for acute osteomyelitis but this was excluded by MRI of right foot.  Is being treated for cellulitis.  Evaluated by podiatry who recommended antibiotic for 7 days, wound care and outpatient follow-up.  Patient had KUB as evaluation for abdominal fullness on 10/29 that showed pleural effusion.  CXR confirmed large right pleural effusion.  IR drained 1.2 L transudative fluid.  Started on IV Lasix and albumin.  Follow echocardiogram.  Palliative consulted as well  Therapy recommended SNF    Subjective: Seen and examined earlier this morning.  No major events overnight of this morning.  Complaining back pain, right foot pain, poor appetite and dry cough.  Abdomen feels tight.  Objective: Vitals:   01/12/22 0559 01/12/22 1000 01/12/22 1109 01/12/22 1358  BP: 114/73 121/69 103/62 93/71  Pulse: (!) 109 98  92  Resp: 20   16  Temp: 98.1 F (36.7 C)   98.6 F (37 C)  TempSrc: Oral   Oral  SpO2: 91%   94%  Weight:      Height:        Examination:  GENERAL: No apparent distress.  Nontoxic. HEENT: MMM.  Vision and hearing grossly intact.  NECK: Supple.  No apparent JVD.  RESP:  No IWOB.  Fair aeration but diminished on the right. CVS:  RRR. Heart sounds normal.  ABD/GI/GU: BS+. Abd soft, NTND.  MSK/EXT:  Moves extremities.  2+ dependent edema.  Significant muscle mass and subcu fat loss. SKIN: Superficial skin lesion both feet.  See  pictures. NEURO: Awake and alert. Oriented appropriately.  No apparent focal neuro deficit. PSYCH: Calm. Normal affect.     Procedures:  None  Microbiology summarized: Blood cultures NGTD  Assessment and plan: Principal Problem:   Osteomyelitis ([place]) Active Problems:   Chronic pancreatitis ([facility])   Hypokalemia   Hypomagnesemia   AKI (acute kidney injury) ([place])   Protein-calorie malnutrition, severe ([facility])   Chronic pain syndrome   Cellulitis of right foot   Normocytic anemia   Gastroesophageal reflux disease without esophagitis   Pressure injury of skin   Physical deconditioning   Pleural effusion   Abdominal distention   Acute respiratory failure with hypoxia ([facility])   Hypoalbuminemia   Anasarca Acute respiratory failure with hypoxia due to right pleural effusion and atelectasis: CXR shows large right pleural effusion.  BNP is elevated to 256.  She has no history of CHF.  Albumin is very low. -S/p thoracocentesis with removal of 1.2 L transudative fluid -Follow-up fluid culture and cytology. -IV Lasix with IV albumin -Check echocardiogram -Encourage incentive spirometry -Wean oxygen as able -Requested PICC line due to poor access  Anasarca/hypoalbuminemia: She has 2+ dependent edema.  I think this is nutritional -Management as above. -Optimize nutrition  Bilateral feet cellulitis/ulceration Bilateral second toe deformity -Right foot x-ray raise concern for cellulitis and osteomyelitis but negative on MRI -Left foot x-ray suspicious for cellulitis but no osteo -No leukocytosis.  CRP 1.3.  ESR  15. -Patient is not diabetic.  Not a smoker. -No plan for surgery.  Podiatry recommends 7 days of antibiotic and wound care -De-escalated antibiotic to Ancef-last day of antibiotics 10/4. -Pain control -Optimize nutrition.  Appreciate help by dietitian.  AKI: Resolved but not making much urine. Recent Labs    01/06/22 2025 01/07/22 1155 01/08/22 1926 01/09/22 0955  01/10/22 0401 01/11/22 0840 01/12/22 0427  BUN _0 CREATININE 1.18* 0.99 0.89 0.78 0.67 0.67 0.75  -Monitor.  Abdominal distention: KUB raises concern for ileus.  Reports passing gas and small BMs. -Minimize opiate.  Fentanyl discontinued and oxycodone reduced. -Continue home Butrans -Continue bowel regimen  Hypokalemia/hypomagnesemia/hyponatremia: Refeeding syndrome?  Improved. -Monitor replenish as appropriate   Chronic alcoholic pancreatitis/pancreatic insufficiency/chronic pain: Sober for over a year. -Pain control-resume home Butrans.  Continue fentanyl as needed -Continue home Creon -Consult dietitian  Lactic acidosis: Likely type B. -Continue monitoring   Normocytic anemia: H&H relatively stable.  Anemia panel suggests anemia of chronic disease. Recent Labs    01/06/22 2025 01/07/22 1154 01/09/22 0016 01/09/22 0955 01/10/22 0401 01/11/22 0840 01/12/22 0427  HGB 10.9* 13.8 10.2* 10.0* 9.8* 8.5* 10.8*  -Monitor H&H  Mild sinus tachycardia: Could be due to anemia, deconditioning -Optimize electrolytes  GERD  - PPI  Vitamin D insufficiency -Supplement  Physical deconditioning/ambulatory dysfunction -PT/OT-recommended SNF  Severe protein caloric malnutrition Body mass index is 16.73 kg/m. Nutrition Problem: Severe Malnutrition Etiology: chronic illness (chronic pancreatitis, h/o multiple gastric surgeries including gastric bypass) Signs/Symptoms: severe fat depletion, severe muscle depletion, energy intake < or equal to 75% for > or equal to 1 month Interventions: Ensure Enlive (each supplement provides 350kcal and 20 grams of protein), MVI, Education  Pressure skin injury: POA Pressure Injury 01/07/22 Coccyx Mid Stage 1 -  Intact skin with non-blanchable redness of a localized area usually over a bony prominence. (Active)  01/07/22 1848  Location: Coccyx  Location Orientation: Mid  Staging: Stage 1 -  Intact skin with non-blanchable  redness of a localized area usually over a bony prominence.  Wound Description (Comments):   Present on Admission: Yes  Dressing Type Foam - Lift dressing to assess site every shift 01/11/22 0900   DVT prophylaxis:  enoxaparin (LOVENOX) injection 40 mg Start: 01/07/22 2000  Code Status: Full code Family Communication: None at bedside Level of care: Telemetry Status is: Inpatient Remains inpatient appropriate because: Acute respiratory failure with hypoxia due to right pleural effusion   Final disposition: SNF Consultants:  Podiatry-signed off  Sch Meds:  Scheduled Meds:  ascorbic acid  500 mg Oral BID   buprenorphine  4 patch Transdermal Weekly   docusate sodium  100 mg Oral BID   enoxaparin (LOVENOX) injection  40 mg Subcutaneous Q24H   feeding supplement  237 mL Oral TID BM   furosemide  40 mg Intravenous Daily   lipase/protease/amylase  24,000 Units Oral TID WC   multivitamin with minerals  1 tablet Oral Daily   nortriptyline  30 mg Oral QHS   pantoprazole  40 mg Oral Daily   silver sulfADIAZINE   Topical BID   zinc sulfate  220 mg Oral Daily   Continuous Infusions:  albumin human      ceFAZolin (ANCEF) IV 1 g (01/12/22 1019)   PRN Meds:.ondansetron (ZOFRAN) IV, oxyCODONE, polyethylene glycol, senna-docusate, tiZANidine  Antimicrobials: Anti-infectives (From admission, onward)    Start     Dose/Rate Route Frequency Ordered Stop   01/09/22 1800  ceFAZolin (  ANCEF) IVPB 1 g/50 mL premix        1 g 100 mL/hr over 30 Minutes Intravenous Every 8 hours 01/09/22 1513 01/16/22 1759   01/06/22 2030  cefTRIAXone (ROCEPHIN) 2 g in sodium chloride 0.9 % 100 mL IVPB  Status:  Discontinued       See Hyperspace for full Linked Orders Report.   2 g 200 mL/hr over 30 Minutes Intravenous Every 24 hours 01/06/22 2017 01/09/22 1513   01/06/22 2030  metroNIDAZOLE (FLAGYL) IVPB 500 mg  Status:  Discontinued       See Hyperspace for full Linked Orders Report.   500 mg 100 mL/hr over  60 Minutes Intravenous Every 12 hours 01/06/22 2017 01/09/22 1513        I have personally reviewed the following labs and images: CBC: Recent Labs  Lab 01/06/22 2025 01/07/22 1154 01/09/22 0016 01/09/22 0955 01/10/22 0401 01/11/22 0840 01/12/22 0427  WBC 10.3 10.0 11.3* 9.4 11.5* 8.9 11.9*  NEUTROABS 8.1* 8.1*  --   --   --   --   --   HGB 10.9* 13.8 10.2* 10.0* 9.8* 8.5* 10.8*  HCT 32.5* 41.8 29.8* 30.7* 29.3* 25.2* 32.7*  MCV 92.3 93.7 92.0 95.9 95.4 94.7 95.3  PLT 180 192 158 136* 132* 108* 129*   BMP &GFR Recent Labs  Lab 01/08/22 1926 01/09/22 0955 01/10/22 0401 01/11/22 0840 01/12/22 0427  NA 135 134* 131* 131* 131*  K 3.5 3.3* 4.3 4.2 4.3  CL 106 105 106 104 102  CO2 _0 GLUCOSE 154* 182* 148* 151* 130*  BUN _1 CREATININE 0.89 0.78 0.67 0.67 0.75  CALCIUM 7.1* 7.0* 7.0* 6.9* 7.3*  MG 1.6* 1.5* 1.7 1.6* 2.0  PHOS  --  1.1* 1.7* 2.2* 1.8*   Estimated Creatinine Clearance: 56 mL/min (by C-G formula based on SCr of 0.75 mg/dL). Liver & Pancreas: Recent Labs  Lab 01/07/22 1155 01/08/22 1926 01/09/22 0955 01/10/22 0401 01/11/22 0840 01/12/22 0427  AST 77* 48* 41  --   --   --   ALT 44 32 31  --   --   --   ALKPHOS 147* 130* 128*  --   --   --   BILITOT 1.0 1.0 0.8  --   --   --   PROT 5.7* 4.8* 4.8*  --  3.9*  --   ALBUMIN 2.1* 1.7* 1.6* 1.5* <1.5* <1.5*   No results for input(s): "LIPASE", "AMYLASE" in the last 168 hours. No results for input(s): "AMMONIA" in the last 168 hours. Diabetic: No results for input(s): "HGBA1C" in the last 72 hours.  No results for input(s): "GLUCAP" in the last 168 hours. Cardiac Enzymes: Recent Labs  Lab 01/08/22 1926  CKTOTAL 64   No results for input(s): "PROBNP" in the last 8760 hours. Coagulation Profile: No results for input(s): "INR", "PROTIME" in the last 168 hours. Thyroid Function Tests: Recent Labs    01/12/22 0427  TSH 13.153*  FREET4 0.85   Lipid Profile: No results  for input(s): "CHOL", "HDL", "LDLCALC", "TRIG", "CHOLHDL", "LDLDIRECT" in the last 72 hours. Anemia Panel: Recent Labs    01/10/22 0401  VITAMINB12 1,325*  FOLATE 5.9*  FERRITIN 236  TIBC 98*  IRON 58  RETICCTPCT 1.7   Urine analysis:    Component Value Date/Time   COLORURINE YELLOW 09/30/2017 1449   APPEARANCEUR CLEAR 09/30/2017 1449   LABSPEC <1.005 (L) 09/30/2017 1449   PHURINE  6.5 09/30/2017 1449   GLUCOSEU NEGATIVE 09/30/2017 1449   HGBUR NEGATIVE 09/30/2017 1449   BILIRUBINUR NEGATIVE 09/30/2017 1449   KETONESUR NEGATIVE 09/30/2017 1449   PROTEINUR NEGATIVE 09/30/2017 1449   UROBILINOGEN 4.0 (H) 06/07/2011 0007   NITRITE NEGATIVE 09/30/2017 1449   LEUKOCYTESUR TRACE (A) 09/30/2017 1449   Sepsis Labs: Invalid input(s): "PROCALCITONIN", "LACTICIDVEN"  Microbiology: Recent Results (from the past 240 hour(s))  Culture, blood (routine x 2)     Status: None   Collection Time: 01/06/22  8:25 PM   Specimen: BLOOD  Result Value Ref Range Status   Specimen Description   Final    BLOOD RIGHT ANTECUBITAL Performed at [facility], [address] [place], [place] [address]    Special Requests   Final    BOTTLES DRAWN AEROBIC AND ANAEROBIC Blood Culture adequate volume Performed at [facility], [address] [place], [place] [address]    Culture   Final    NO GROWTH 5 DAYS Performed at [facility], [place] [address] [place], [place] [address]    Report Status 01/11/2022 FINAL  Final  Culture, blood (routine x 2)     Status: None   Collection Time: 01/07/22  6:51 PM   Specimen: BLOOD  Result Value Ref Range Status   Specimen Description   Final    BLOOD SITE NOT SPECIFIED Performed at [facility] [address], [place], [place] [address]    Special Requests   Final    BOTTLES DRAWN AEROBIC ONLY Blood Culture adequate volume Performed at [facility] [address], [place], [place] [address]     Culture   Final    NO GROWTH 5 DAYS Performed at [facility], [place] [address] [place], [place] [address]    Report Status 01/12/2022 FINAL  Final  Culture, blood (Routine X 2) w Reflex to ID Panel     Status: None   Collection Time: 01/07/22  6:51 PM   Specimen: BLOOD  Result Value Ref Range Status   Specimen Description   Final    BLOOD SITE NOT SPECIFIED Performed at [facility] [address], [place], [place] [address]    Special Requests   Final    BOTTLES DRAWN AEROBIC ONLY Blood Culture adequate volume Performed at [facility] [address] [place], [place] [address]    Culture   Final    NO GROWTH 5 DAYS Performed at [facility], [place] [address] [place], [place] [address]    Report Status 01/12/2022 FINAL  Final    Radiology Studies: DG CHEST PORT 1 VIEW  Result Date: 01/12/2022 CLINICAL DATA:  Status post right thoracentesis EXAM: PORTABLE CHEST 1 VIEW COMPARISON:  Previous studies including the chest radiographs done on 01/10/2022 FINDINGS: There is marked decrease in right pleural effusion. Small bilateral pleural effusions are seen in the current study. There is no pneumothorax. Infiltrates are seen in both lungs, more so on the left side. There is interval worsening of infiltrate in left upper lung field. Deformity in the posterolateral aspect of right ninth rib may suggest healed or healing fracture. IMPRESSION: There is marked decrease in right pleural effusion. There is no pneumothorax. Infiltrates are seen in both lungs, more so on the left side suggesting multifocal pneumonia or asymmetric pulmonary edema. There is interval increase in alveolar densities in left upper lung field. Small bilateral pleural effusions are  seen. Electronically Signed   By: Elmer Picker M.D.   On: 01/12/2022 12:06   [place] EKG SITE RITE  Result Date: 01/12/2022 If Site Rite image not attached, placement could not be confirmed  due to current cardiac rhythm.     Sarye Kath T. [place]  If 7PM-7AM, please contact night-coverage [email or web address] 01/12/2022, 2:20 PM

## 2022-01-12 NOTE — Procedures (Signed)
PROCEDURE SUMMARY:  Successful US guided right thoracentesis. Yielded 1.2 L of hazy yellow/white fluid. Pt tolerated procedure well. No immediate complications.  Specimen sent for labs. CXR ordered; no post-procedure pneumothorax identified.   EBL < 2 mL  Theresa Duty, NP 01/12/2022 2:08 PM

## 2022-01-12 NOTE — Care Management Important Message (Signed)
Important Message  Patient Details IM Letter given Name: Carolyn [name] MRN: [id number] Date of Birth: [birth year]   Medicare Important Message Given:  Yes     Kerin Salen 01/12/2022, 12:13 PM

## 2022-01-12 NOTE — Progress Notes (Signed)
Peripherally Inserted Central Catheter Placement  The IV Nurse has discussed with the patient and/or persons authorized to consent for the patient, the purpose of this procedure and the potential benefits and risks involved with this procedure.  The benefits include less needle sticks, lab draws from the catheter, and the patient may be discharged home with the catheter. Risks include, but not limited to, infection, bleeding, blood clot (thrombus formation), and puncture of an artery; nerve damage and irregular heartbeat and possibility to perform a PICC exchange if needed/ordered by physician.  Alternatives to this procedure were also discussed.  Bard Power PICC patient education guide, fact sheet on infection prevention and patient information card has been provided to patient /or left at bedside.    PICC Placement Documentation  PICC Double Lumen 15/83/09 Right Basilic 32 cm 0 cm (Active)  Indication for Insertion or Continuance of Line Poor Vasculature-patient has had multiple peripheral attempts or PIVs lasting less than 24 hours 01/12/22 1532  Exposed Catheter (cm) 0 cm 01/12/22 1532  Site Assessment Clean, Dry, Intact 01/12/22 1532  Lumen #1 Status Flushed;Saline locked;Blood return noted 01/12/22 1532  Lumen #2 Status Flushed;Saline locked;Blood return noted 01/12/22 1532  Dressing Type Transparent;Securing device 01/12/22 1532  Dressing Status Antimicrobial disc in place 01/12/22 1532  Safety Lock Not Applicable 40/76/80 8811  Dressing Change Due 01/19/22 01/12/22 [name] 01/12/2022, 3:35 PM

## 2022-01-12 NOTE — Progress Notes (Signed)
PT Cancellation Note  Patient Details Name: Carolyn [name] MRN: [id number] DOB: 21-Jan-1956   Cancelled Treatment:    Reason Eval/Treat Not Completed: Patient at procedure or test/unavailable   Kati L Payson 01/12/2022, 10:52 AM Arlyce Dice, DPT Physical Therapist Acute [facility] Services Preferred contact method: Secure Chat Weekend Pager Only: [phone] Office: [phone]

## 2022-01-13 DIAGNOSIS — R531 Weakness: Secondary | ICD-10-CM | POA: Diagnosis not present

## 2022-01-13 DIAGNOSIS — L03115 Cellulitis of right lower limb: Secondary | ICD-10-CM | POA: Diagnosis not present

## 2022-01-13 DIAGNOSIS — Z7189 Other specified counseling: Secondary | ICD-10-CM

## 2022-01-13 DIAGNOSIS — Z515 Encounter for palliative care: Secondary | ICD-10-CM | POA: Diagnosis not present

## 2022-01-13 LAB — CBC
HCT: 24.6 % — ABNORMAL LOW (ref 36.0–46.0)
Hemoglobin: 8 g/dL — ABNORMAL LOW (ref 12.0–15.0)
MCH: 31.4 pg (ref 26.0–34.0)
MCHC: 32.5 g/dL (ref 30.0–36.0)
MCV: 96.5 fL (ref 80.0–100.0)
Platelets: 94 10*3/uL — ABNORMAL LOW (ref 150–400)
RBC: 2.55 MIL/uL — ABNORMAL LOW (ref 3.87–5.11)
RDW: 14.9 % (ref 11.5–15.5)
WBC: 6.1 10*3/uL (ref 4.0–10.5)
nRBC: 0 % (ref 0.0–0.2)

## 2022-01-13 LAB — RENAL FUNCTION PANEL
Albumin: 1.5 g/dL — ABNORMAL LOW (ref 3.5–5.0)
Anion gap: 4 — ABNORMAL LOW (ref 5–15)
BUN: 8 mg/dL (ref 8–23)
CO2: 26 mmol/L (ref 22–32)
Calcium: 7 mg/dL — ABNORMAL LOW (ref 8.9–10.3)
Chloride: 101 mmol/L (ref 98–111)
Creatinine, Ser: 0.63 mg/dL (ref 0.44–1.00)
GFR, Estimated: >60 mL/min (ref >60)
Glucose, Bld: 103 mg/dL — ABNORMAL HIGH (ref 70–99)
Phosphorus: 1.7 mg/dL — ABNORMAL LOW (ref 2.5–4.6)
Potassium: 3.9 mmol/L (ref 3.5–5.1)
Sodium: 131 mmol/L — ABNORMAL LOW (ref 135–145)

## 2022-01-13 LAB — VITAMIN A: Vitamin A (Retinoic Acid): 8.5 ug/dL — ABNORMAL LOW (ref 22.0–69.5)

## 2022-01-13 LAB — MAGNESIUM: Magnesium: 1.4 mg/dL — ABNORMAL LOW (ref 1.7–2.4)

## 2022-01-13 MED ORDER — VITAMIN D (ERGOCALCIFEROL) 1.25 MG (50000 UNIT) PO CAPS
50000.0000 [IU] | ORAL_CAPSULE | ORAL | Status: DC
  Administered 2022-01-13 – 2022-01-20 (×2): 50000 [IU] via ORAL
  Filled 2022-01-13 (×2): qty 1

## 2022-01-13 MED ORDER — VITAMIN A 3 MG (10000 UNIT) PO CAPS
50000.0000 [IU] | ORAL_CAPSULE | Freq: Every day | ORAL | Status: DC
  Administered 2022-01-16 – 2022-01-25 (×10): 50000 [IU] via ORAL
  Filled 2022-01-13 (×10): qty 5

## 2022-01-13 MED ORDER — MAGNESIUM SULFATE 2 GM/50ML IV SOLN
2.0000 g | Freq: Once | INTRAVENOUS | Status: AC
  Administered 2022-01-13: 2 g via INTRAVENOUS
  Filled 2022-01-13: qty 50

## 2022-01-13 MED ORDER — VITAMIN A 3 MG (10000 UNIT) PO CAPS
100000.0000 [IU] | ORAL_CAPSULE | Freq: Every day | ORAL | Status: AC
  Administered 2022-01-13 – 2022-01-15 (×3): 100000 [IU] via ORAL
  Filled 2022-01-13 (×3): qty 10

## 2022-01-13 MED ORDER — K PHOS MONO-SOD PHOS DI & MONO 155-852-130 MG PO TABS
500.0000 mg | ORAL_TABLET | Freq: Three times a day (TID) | ORAL | Status: DC
  Administered 2022-01-13 – 2022-01-16 (×12): 500 mg via ORAL
  Filled 2022-01-13 (×13): qty 2

## 2022-01-13 NOTE — Progress Notes (Signed)
PROGRESS NOTE  Druscilla BrownieJane C Convery  WUJ:[id number] DOB: 02/11/1956 DOA: 01/06/2022 PCP: Farris HasMorrow, Aaron, MD   Brief Narrative:  Patient is a 66 year old female with history of chronic alcohol pancreatitis, chronic pain syndrome, GERD, severe malnutrition, osteoporosis who presented initially with right foot wound that he started as a blister/bulla.  Right foot x-ray raised concern for acute osteomyelitis but was asked to repeat MRI, started on antibiotics.  Evaluated by podiatry who recommended antibiotics for 7 days, wound care and outpatient follow-up.  Patient developed abdominal fullness for which KUB was done which showed a pleural effusion.  Chest x-ray confirmed large right pleural effusion.  IR did thoracentesis and was given IV Lasix and albumin.  Palliative care consulted for goals of care who recommend outpatient follow-up at a skilled nursing facility.  PT/OT recommending SNF on discharge.  Assessment & Plan:  Principal Problem:   Osteomyelitis (HCC) Active Problems:   Chronic pancreatitis (HCC)   Hypokalemia   Hypomagnesemia   AKI (acute kidney injury) (HCC)   Protein-calorie malnutrition, severe (HCC)   Chronic pain syndrome   Cellulitis of right foot   Normocytic anemia   Gastroesophageal reflux disease without esophagitis   Pressure injury of skin   Physical deconditioning   Pleural effusion   Abdominal distention   Acute respiratory failure with hypoxia (HCC)   Hypoalbuminemia   Anasarca   Acute hypoxic respiratory failure: Secondary to right pleural effusion, atelectasis.  Chest x-ray showed large pleural effusion.  BNP was also elevated.  No history of CHF.  Albumin was very low.  Status post thoracentesis with removal of 1.2 L of fluid.  Being given IV Lasix and IV albumin.  Continue incentive spirometer, will continue to try to wean the oxygen.  Echocardiogram has been ordered  Anasarca/hypoalbuminemia: Has dependent edema.  Likely nutritional.  Nutritionist  consulted  Bilateral feet cellulitis/ulceration: X-ray of right foot raise concern for cellulitis but osteomyelitis ruled out by MRI.  No leukocytosis.  No plan for surgery, podiatry recommended 7 days course of antibiotics, wound care.  Ancef to be completed on 10/4.  Continue pain management, supportive care  AKI: Resolved  Abdominal distention: KUB raised concern for ileus.  Now having small bowel movements, passing gas.  Pain medication discontinued.  Continue home Butrans.  Continue bowel regimen  Electrolytes abnormalities: Continue to monitor and supplement as needed.  Supplemented with phosphorus, magnesium.  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  Continue home Butrans, fentanyl as needed, Creon  Lactacidosis: Likely type B.  Continue monitoring  Normocytic anemia: Currently hemoglobin in the range of 8, also developed mild thrombocytopenia.  We will continue to monitor.  Anemia panel suggests anemia of chronic disease.  Mild sinus tachycardia: Monitor on telemetry  GERD: Continue PPI  Vitamin D insufficiency: Being supplemented  Physical deconditioning/ambulatory dysfunction: PT/OT recommended SNF  Severe protein calorie malnutrition: nutrition is consulted.  BMI of 16.7  Goals of care: Palliative care also consulted here.  Recommend outpatient follow-up at the SNF  Pressure Injury 01/07/22 Coccyx Mid Stage 1 -  Intact skin with non-blanchable redness of a localized area usually over a bony prominence. (Active)  01/07/22 1848  Location: Coccyx  Location Orientation: Mid  Staging: Stage 1 -  Intact skin with non-blanchable redness of a localized area usually over a bony prominence.  Wound Description (Comments):   Present on Admission: Yes            Nutrition  Problem: Severe Malnutrition Etiology: chronic illness (chronic pancreatitis, h/o multiple gastric  surgeries including gastric bypass) Pressure Injury 01/07/22 Coccyx Mid Stage 1 -  Intact skin with non-blanchable redness of a localized area usually over a bony prominence. (Active)  01/07/22 1848  Location: Coccyx  Location Orientation: Mid  Staging: Stage 1 -  Intact skin with non-blanchable redness of a localized area usually over a bony prominence.  Wound Description (Comments):   Present on Admission: Yes  Dressing Type Foam - Lift dressing to assess site every shift 01/12/22 2130    DVT prophylaxis:enoxaparin (LOVENOX) injection 40 mg Start: 01/07/22 2000     Code Status: Full Code  Family Communication: None at bedside  Patient status:Inpatient  Patient is from :Home  Anticipated discharge TK:PTWS  Estimated DC date:SNF   Consultants: Palliative care  Procedures:None  Antimicrobials:  Anti-infectives (From admission, onward)    Start     Dose/Rate Route Frequency Ordered Stop   01/09/22 1800  ceFAZolin (ANCEF) IVPB 1 g/50 mL premix        1 g 100 mL/hr over 30 Minutes Intravenous Every 8 hours 01/09/22 1513 01/16/22 1759   01/06/22 2030  cefTRIAXone (ROCEPHIN) 2 g in sodium chloride 0.9 % 100 mL IVPB  Status:  Discontinued       See Hyperspace for full Linked Orders Report.   2 g 200 mL/hr over 30 Minutes Intravenous Every 24 hours 01/06/22 2017 01/09/22 1513   01/06/22 2030  metroNIDAZOLE (FLAGYL) IVPB 500 mg  Status:  Discontinued       See Hyperspace for full Linked Orders Report.   500 mg 100 mL/hr over 60 Minutes Intravenous Every 12 hours 01/06/22 2017 01/09/22 1513       Subjective: Patient seen and examined at bedside today.  Hemodynamically stable.  Lying in bed.  On 4 L of oxygen.  Very deconditioned, weak.  Eating her breakfast.  Says she feels better.  Denies any nausea, vomiting or chest pain or shortness of breath.  Appears well overloaded.  Objective: Vitals:   01/12/22 1109 01/12/22 1358 01/12/22 2115 01/13/22 0329  BP: 103/62 93/71 (!)  103/55 123/71  Pulse:  92 82 80  Resp:  16 18 18   Temp:  98.6 F (37 C) 97.7 F (36.5 C) 97.8 F (36.6 C)  TempSrc:  Oral Oral Oral  SpO2:  94% 97% 94%  Weight:      Height:        Intake/Output Summary (Last 24 hours) at 01/13/2022 1011 Last data filed at 01/13/2022 0400 Gross per 24 hour  Intake 736.22 ml  Output --  Net 736.22 ml   Filed Weights   01/06/22 1857  Weight: 49.9 kg    Examination:  General exam: Very deconditioned, weak, malnourished HEENT: PERRL Respiratory system: Diminished sounds on the right side Cardiovascular system: Sinus tach.  Gastrointestinal system: Abdomen is nondistended, soft and nontender. Central nervous system: Alert and oriented Extremities: Bilateral lower extremity edema up to the thigh, no clubbing ,no cyanosis Skin: pressure ulcer as above     Data Reviewed: I have personally reviewed following labs and imaging studies  CBC: Recent Labs  Lab 01/06/22 2025 01/07/22 1154 01/09/22 0016 01/09/22 0955 01/10/22 0401 01/11/22 0840 01/12/22 0427 01/13/22 0257  WBC 10.3 10.0   < > 9.4 11.5* 8.9 11.9* 6.1  NEUTROABS 8.1* 8.1*  --   --   --   --   --   --   HGB 10.9* 13.8   < >  10.0* 9.8* 8.5* 10.8* 8.0*  HCT 32.5* 41.8   < > 30.7* 29.3* 25.2* 32.7* 24.6*  MCV 92.3 93.7   < > 95.9 95.4 94.7 95.3 96.5  PLT 180 192   < > 136* 132* 108* 129* 94*   < > = values in this interval not displayed.   Basic Metabolic Panel: Recent Labs  Lab 01/09/22 0955 01/10/22 0401 01/11/22 0840 01/12/22 0427 01/13/22 0257  NA 134* 131* 131* 131* 131*  K 3.3* 4.3 4.2 4.3 3.9  CL 105 106 104 102 101  CO2 23 24 24 23 26   GLUCOSE 182* 148* 151* 130* 103*  BUN 11 9 8 8 8   CREATININE 0.78 0.67 0.67 0.75 0.63  CALCIUM 7.0* 7.0* 6.9* 7.3* 7.0*  MG 1.5* 1.7 1.6* 2.0 1.4*  PHOS 1.1* 1.7* 2.2* 1.8* 1.7*     Recent Results (from the past 240 hour(s))  Culture, blood (routine x 2)     Status: None   Collection Time: 01/06/22  8:25 PM   Specimen:  BLOOD  Result Value Ref Range Status   Specimen Description   Final    BLOOD RIGHT ANTECUBITAL Performed at [facility], [address] [place], [address] [place]    Special Requests   Final    BOTTLES DRAWN AEROBIC AND ANAEROBIC Blood Culture adequate volume Performed at [facility], [address] [place], [address] [place]    Culture   Final    NO GROWTH 5 DAYS Performed at [facility], [address], [place], [address] [place]    Report Status 01/11/2022 FINAL  Final  Culture, blood (routine x 2)     Status: None   Collection Time: 01/07/22  6:51 PM   Specimen: BLOOD  Result Value Ref Range Status   Specimen Description   Final    BLOOD SITE NOT SPECIFIED Performed at [facility], [address] [place], [place]    Special Requests   Final    BOTTLES DRAWN AEROBIC ONLY Blood Culture adequate volume Performed at [facility], [address] [place], [place]    Culture   Final    NO GROWTH 5 DAYS Performed at [facility], [address], [place], [address] [place]    Report Status 01/12/2022 FINAL  Final  Culture, blood (Routine X 2) w Reflex to ID Panel     Status: None   Collection Time: 01/07/22  6:51 PM   Specimen: BLOOD  Result Value Ref Range Status   Specimen Description   Final    BLOOD SITE NOT SPECIFIED Performed at [facility], [address], [place], [place]    Special Requests   Final    BOTTLES DRAWN AEROBIC ONLY Blood Culture adequate volume Performed at [facility], [address], [place], [place]    Culture   Final    NO GROWTH 5 DAYS Performed at [facility], [address] [place], [address] [place]    Report Status 01/12/2022 FINAL  Final  Body fluid culture w Gram Stain     Status: None (Preliminary result)   Collection Time: 01/12/22 12:30 PM   Specimen: Peritoneal Washings   Result Value Ref Range Status   Specimen Description   Final    PERITONEAL Performed at [facility], [address], [place], [place]    Special  Requests   Final    NONE Performed at [facility], [place] [address], [place], [place] [address]    Gram Stain   Final    RARE WBC PRESENT,BOTH PMN AND MONONUCLEAR NO ORGANISMS SEEN Performed at [facility], [place] [address], [place], [place] [address]    Culture PENDING  Incomplete   Report Status PENDING  Incomplete     Radiology Studies: US THORACENTESIS ASP PLEURAL SPACE W/IMG GUIDE  Result Date: 01/12/2022 INDICATION: Patient with a right pleural effusion of unknown etiology. Interventional radiology asked to perform a diagnostic and therapeutic thoracentesis. EXAM: ULTRASOUND GUIDED THORACENTESIS MEDICATIONS: 1% lidocaine 10 mL COMPLICATIONS: None immediate. PROCEDURE: An ultrasound guided thoracentesis was thoroughly discussed with the patient and questions answered. The benefits, risks, alternatives and complications were also discussed. The patient understands and wishes to proceed with the procedure. Written consent was obtained. Ultrasound was performed to localize and mark an adequate pocket of fluid in the right chest. The area was then prepped and draped in the normal sterile fashion. 1% Lidocaine was used for local anesthesia. Under ultrasound guidance a 6 Fr Safe-T-Centesis catheter was introduced. Thoracentesis was performed. The catheter was removed and a dressing applied. FINDINGS: A total of approximately 1.2 L of hazy yellow/white fluid was removed. Samples were sent to the laboratory as requested by the clinical team. IMPRESSION: Successful ultrasound guided RIGHT thoracentesis yielding 1.2 L of pleural fluid. Read by: Soyla Dryer, NP Electronically Signed   By: Michaelle Birks M.D.   On: 01/12/2022 19:07   DG CHEST PORT 1 VIEW  Result Date: 01/12/2022 CLINICAL DATA:   Status post right thoracentesis EXAM: PORTABLE CHEST 1 VIEW COMPARISON:  Previous studies including the chest radiographs done on 01/10/2022 FINDINGS: There is marked decrease in right pleural effusion. Small bilateral pleural effusions are seen in the current study. There is no pneumothorax. Infiltrates are seen in both lungs, more so on the left side. There is interval worsening of infiltrate in left upper lung field. Deformity in the posterolateral aspect of right ninth rib may suggest healed or healing fracture. IMPRESSION: There is marked decrease in right pleural effusion. There is no pneumothorax. Infiltrates are seen in both lungs, more so on the left side suggesting multifocal pneumonia or asymmetric pulmonary edema. There is interval increase in alveolar densities in left upper lung field. Small bilateral pleural effusions are seen. Electronically Signed   By: Elmer Picker M.D.   On: 01/12/2022 12:06   [place] EKG SITE RITE  Result Date: 01/12/2022 If Site Rite image not attached, placement could not be confirmed due to current cardiac rhythm.   Scheduled Meds:  ascorbic acid  500 mg Oral BID   buprenorphine  4 patch Transdermal Weekly   Chlorhexidine Gluconate Cloth  6 each Topical Daily   docusate sodium  100 mg Oral BID   enoxaparin (LOVENOX) injection  40 mg Subcutaneous Q24H   feeding supplement  237 mL Oral TID BM   furosemide  40 mg Intravenous Daily   lipase/protease/amylase  24,000 Units Oral TID WC   multivitamin with minerals  1 tablet Oral Daily   nortriptyline  30 mg Oral QHS   pantoprazole  40 mg Oral Daily   phosphorus  500 mg Oral TID   silver sulfADIAZINE   Topical BID   sodium chloride flush  10-40 mL Intracatheter Q12H   zinc sulfate  220 mg Oral Daily   Continuous Infusions:  albumin human 25 g (01/12/22 1648)  ceFAZolin (ANCEF) IV 1 g (01/13/22 0309)   magnesium sulfate bolus IVPB       LOS: 6 days   Burnadette Pop, MD [facility] P11/03/2021, 10:11 AM

## 2022-01-13 NOTE — Consult Note (Signed)
Consultation Note Date: 01/13/2022   Patient Name: Carolyn [name]  DOB: 1955/12/12  MRN: [id number]  Age / Sex: 66 y.o., female  PCP: London Pepper, MD Referring Physician: Shelly Coss, MD  Reason for Consultation: Establishing goals of care  HPI/Patient Profile: 66 y.o. female   admitted on 01/06/2022   Clinical Assessment and Goals of Care: 66 year old lady who lives at home with her husband, admitted with a foot wound, history of chronic alcoholic pancreatitis, has chronic pain, sees pain specialist in the outpatient setting in the past, has pancreatic insufficiency gastroesophageal reflux disease severe malnutrition and osteoporosis.  Patient presented with right foot wound that initially started as a blister due to poorly fitted boots, she came to the emergency room, being treated for cellulitis, evaluated by podiatry with recommendations for wound care and antibiotics.  Patient underwent thoracentesis for large pleural effusion, palliative medicine consulted for ongoing goals of care discussions. Patient is awake alert resting in bed.  Gives her own history, she is aware of her current hospitalization and diagnosis.  She does not have a living will.  States that she does not consume alcohol anymore.  She states that she was on transdermal fentanyl but is now on a Butrans patch. Palliative medicine is specialized medical care for people living with serious illness. It focuses on providing relief from the symptoms and stress of a serious illness. The goal is to improve quality of life for both the patient and the family. Goals of care: Broad aims of medical therapy in relation to the patient's values and preferences. Our aim is to provide medical care aimed at enabling patients to achieve the goals that matter most to them, given the circumstances of their particular medical situation and their constraints.   Goals of care discussed, see below.  NEXT OF KIN Lives at home with husband and her pets  SUMMARY OF RECOMMENDATIONS   Code/full scope care Patient is not opposed to PICC line, not opposed to NG tube for nutrition, states that she has had a G-tube in the past Commend SNF rehab with palliative on discharge Thank you for the consult  Code Status/Advance Care Planning: Full code   Symptom Management:  Continue current pain and on pain symptom management  Palliative Prophylaxis:  Delirium Protocol  Additional Recommendations (Limitations, Scope, Preferences): Full Scope Treatment  Psycho-social/Spiritual:  Desire for further Chaplaincy support:yes Additional Recommendations: Caregiving  Support/Resources  Prognosis:  Unable to determine  Discharge Planning: [facility] for rehab with Palliative care service follow-up      Primary Diagnoses: Present on Admission:  Osteomyelitis ([place])  Chronic pancreatitis ([place])   I have reviewed the medical record, interviewed the patient and family, and examined the patient. The following aspects are pertinent.  Past Medical History:  Diagnosis Date   Alcoholic cirrhosis ([place])    Chronic pancreatitis ([place])    Endometriosis    GERD (gastroesophageal reflux disease)    Liver disease    Malabsorption    MALABSORPTION SYNDROME   Osteoporosis 03/2011  t score -2.5   Pancreatitis    due to cyst and tumors due to calcifications   Seizure (HCC)    no seizure disorder   Vitamin D deficiency 2012   VIT D 15   Social History   Socioeconomic History   Marital status: Single    Spouse name: Not on file   Number of children: Not on file   Years of education: Not on file   Highest education level: Not on file  Occupational History   Not on file  Tobacco Use   Smoking status: Never   Smokeless tobacco: Never  Substance and Sexual Activity   Alcohol use: Yes    Comment: occ ( none in several weeks)   Drug use:  No   Sexual activity: Never    Partners: Male  Other Topics Concern   Not on file  Social History Narrative   Not on file   Social Determinants of Health   Financial Resource Strain: Not on file  Food Insecurity: No Food Insecurity (01/07/2022)   Hunger Vital Sign    Worried About Running Out of Food in the Last Year: Never true    Ran Out of Food in the Last Year: Never true  Transportation Needs: No Transportation Needs (01/07/2022)   PRAPARE - Administrator, Civil Service (Medical): No    Lack of Transportation (Non-Medical): No  Physical Activity: Not on file  Stress: Not on file  Social Connections: Not on file   Family History  Problem Relation Age of Onset   Cancer Father        BONE   Breast cancer Maternal Grandmother    Scheduled Meds:  ascorbic acid  500 mg Oral BID   buprenorphine  4 patch Transdermal Weekly   Chlorhexidine Gluconate Cloth  6 each Topical Daily   docusate sodium  100 mg Oral BID   enoxaparin (LOVENOX) injection  40 mg Subcutaneous Q24H   feeding supplement  237 mL Oral TID BM   furosemide  40 mg Intravenous Daily   lipase/protease/amylase  24,000 Units Oral TID WC   multivitamin with minerals  1 tablet Oral Daily   nortriptyline  30 mg Oral QHS   pantoprazole  40 mg Oral Daily   phosphorus  500 mg Oral TID   silver sulfADIAZINE   Topical BID   sodium chloride flush  10-40 mL Intracatheter Q12H   zinc sulfate  220 mg Oral Daily   Continuous Infusions:  albumin human 25 g (01/12/22 1648)    ceFAZolin (ANCEF) IV 1 g (01/13/22 0309)   magnesium sulfate bolus IVPB     PRN Meds:.ondansetron (ZOFRAN) IV, oxyCODONE, polyethylene glycol, senna-docusate, sodium chloride flush, tiZANidine Medications Prior to Admission:  Prior to Admission medications   Medication Sig Start Date End Date Taking? Authorizing Provider  buprenorphine (BUTRANS) 20 MCG/HR PTWK Place 1 patch onto the skin once a week. 02/23/21  Yes [provider]  calcium carbonate (OS-CAL - DOSED IN MG OF ELEMENTAL CALCIUM) 1250 MG tablet Take 1 tablet by mouth daily.   Yes [provider]  famotidine (PEPCID) 10 MG tablet Take 10 mg by mouth 2 (two) times daily.   Yes [provider]  ibuprofen (ADVIL,MOTRIN) 200 MG tablet Take 600 mg by mouth every 6 (six) hours as needed for moderate pain.   Yes [provider]  lidocaine (XYLOCAINE) 2 % solution Use as directed 15 mLs in the mouth or throat every 6 (six)  hours as needed for mouth pain. 09/05/20  Yes Cardama, Amadeo Garnet, MD  lipase/protease/amylase (CREON) 12000 units CPEP capsule Take 2 capsules (24,000 Units total) by mouth 3 (three) times daily with meals. 08/24/18  Yes Tressia Danas, MD  Multiple Vitamin (MULITIVITAMIN WITH MINERALS) TABS Take 1 tablet by mouth daily.   Yes [provider]  nortriptyline (PAMELOR) 10 MG capsule Take 30 mg by mouth at bedtime. 04/13/21  Yes [provider]  ondansetron (ZOFRAN) 4 MG tablet Take 4 mg by mouth every 8 (eight) hours as needed for nausea or vomiting.   Yes [provider]  tiZANidine (ZANAFLEX) 2 MG tablet Take 2 mg by mouth every 6 (six) hours as needed for muscle spasms.   Yes [provider]  vitamin B-12 (CYANOCOBALAMIN) 100 MCG tablet Take 100 mcg by mouth daily.   Yes [provider]  vitamin C (ASCORBIC ACID) 250 MG tablet Take 250 mg by mouth daily.   Yes [provider]  ascorbic acid (VITAMIN C) 500 MG tablet Take 1 tablet (500 mg total) by mouth 2 (two) times daily. 01/09/22   Almon Hercules, MD  Hyoscyamine Sulfate SL (LEVSIN/SL) 0.125 MG SUBL Place 1 each under the tongue 4 (four) times daily as needed for up to 5 days. Patient not taking: Reported on 01/07/2022 09/05/20 09/10/20  Nira Conn, MD  zinc sulfate 220 (50 Zn) MG capsule Take 1 capsule (220 mg total) by mouth daily. 01/09/22   Almon Hercules, MD  PROMETHAZINE HCL PO Take 4 mg by mouth.    06/06/11  [provider]  Ranitidine HCl (ZANTAC PO) Take by mouth.    06/06/11  [provider]   Allergies  Allergen Reactions   Peanut-Containing Drug Products Anaphylaxis    Tree nuts   Review of Systems Complains of generalized pain Physical Exam Thin and weak appearing lady Lower extremity 2+ edema Significant cachexia and muscle mass loss evident Has skin lesions both feet pictures seen Awake alert oriented answers all questions appropriately  Vital Signs: BP 123/71 (BP Location: Left Arm)   Pulse 80   Temp 97.8 F (36.6 C) (Oral)   Resp 18   Ht 5\' 8"  (1.727 m)   Wt 49.9 kg   LMP 03/10/2009   SpO2 94%   BMI 16.73 kg/m  Pain Scale: 0-10 POSS *See Group Information*: 1-Acceptable,Awake and alert Pain Score: Asleep   SpO2: SpO2: 94 % O2 Device:SpO2: 94 % O2 Flow Rate: .O2 Flow Rate (L/min): 5 L/min  IO: Intake/output summary:  Intake/Output Summary (Last 24 hours) at 01/13/2022 0742 Last data filed at 01/13/2022 0400 Gross per 24 hour  Intake 736.22 ml  Output --  Net 736.22 ml    LBM: Last BM Date : 01/11/22 Baseline Weight: Weight: 49.9 kg Most recent weight: Weight: 49.9 kg     Palliative Assessment/Data:   PPS 50%  Time In:  6.40 Time Out:  7.40 Time Total:  60  Greater than 50%  of this time was spent counseling and coordinating care related to the above assessment and plan.  Signed by: 01/13/22, MD   Please contact Palliative Medicine Team phone at [phone] for questions and concerns.  For individual provider: See [phone]

## 2022-01-13 NOTE — Progress Notes (Signed)
Nutrition Follow-up  DOCUMENTATION CODES:   Severe malnutrition in context of chronic illness, Underweight  INTERVENTION:   -Ensure Plus High Protein po BID, each supplement provides 350 kcal and 20 grams of protein.   To aid in wound healing: -500 mg Vitamin C BID -220 mg Zinc sulfate daily x 14 days  -Multivitamin with minerals daily  -Vitamin A supplementation: -100,000 units for 3 days -Followed by 50,000 units for 14 days  NUTRITION DIAGNOSIS:   Severe Malnutrition related to chronic illness (chronic pancreatitis, h/o multiple gastric surgeries including gastric bypass) as evidenced by severe fat depletion, severe muscle depletion, energy intake < or equal to 75% for > or equal to 1 month.  Ongoing.  GOAL:   Patient will meet greater than or equal to 90% of their needs  Progressing.  MONITOR:   PO intake, Supplement acceptance, Weight trends, Labs, I & O's, Skin  REASON FOR ASSESSMENT:   Consult Assessment of nutrition requirement/status  ASSESSMENT:   66 y.o. female with medical history significant of alcoholic cirrhosis, chronic pancreatitis, chronic pain, GERD. Presenting with a right foot wound.  Patient lying in bed, just got done working with therapy. Pt states she is trying to calm her nausea, tends to have nausea after working with therapy. Lunch was sitting on table, untouched. Pt states she plans to eat of it later. Had an Ensure Max on table which was consumed. States normally she has been given an Ensure Plus. Pt taking her vitamin supplementations with no issue.  Informed pt that her Vitamin A lab came back low. Vitamin A supplementation was started and MD approved orders. Noted that pt was started on Vitamin D supplementation as well.  Pt encouraged to eat small, frequent meals if this is more feasible for her. States she can't tolerate large amounts. Pt did eat 1/2 egg, bacon and cheese croissant this morning with a blueberry muffin.   Admission  weight: 110 lbs No new weights for this admission.  Medications: Vitamin C, Colace, Lasix, Creon, Multivitamin with minerals daily, K-Phos, Vitamin D weekly, Zinc sulfate, IV Mg sulfate  Labs reviewed: Low Na  Low Phos Low Mg  Diet Order:   Diet Order             Diet general           Diet regular Room service appropriate? Yes; Fluid consistency: Thin  Diet effective now                   EDUCATION NEEDS:   Education needs have been addressed  Skin:  Skin Assessment: Skin Integrity Issues: Skin Integrity Issues:: Stage I, Other (Comment) Stage I: mid coccyx Other: non-pressure wounds: right ankle, right toe, bottom of right foot, burn on hands  Last BM:  10/30 -type 6  Height:   Ht Readings from Last 1 Encounters:  01/06/22 5\' 8"  (1.727 m)    Weight:   Wt Readings from Last 1 Encounters:  01/06/22 49.9 kg    BMI:  Body mass index is 16.73 kg/m.  Estimated Nutritional Needs:   Kcal:  1900-2100  Protein:  95-105g  Fluid:  2.1L/day  Clayton Bibles, MS, RD, LDN Inpatient Clinical Dietitian Contact information available via Amion

## 2022-01-13 NOTE — Progress Notes (Addendum)
Physical Therapy Treatment Patient Details Name: Carolyn [name] MRN: [id number] DOB: 27-Jan-1956 Today's Date: 01/13/2022   History of Present Illness 66 y.o. female nondiabetic, not a smoker, malnourished, presenting for bilateral foot cellulitis/with ulcers.  Most concerning the right second toe.  X-ray raises concern for cellulitis and osteomyelitis but negative on more sensitive MRI.  Podiatry consulted. Pt underwent thoracentesis 01/12/22.  PMH: ETOH use, cirrhosis, liver disease, malabsorption syndrome, pancreatitis, Szs    PT Comments    Pt improving this session; able to stand and tolerate wt incr wt on LEs and transfer to chair with 2 person assist. HR max 132 with activity and low 100s at rest. SpO2=95% on 3.5-4L.  Pt is very excited to have her PICC line.  She is cooperative and motivated to work with PT. Continue to recommend SNF as pt will need longer term rehab.    Recommendations for follow up therapy are one component of a multi-disciplinary discharge planning process, led by the attending physician.  Recommendations may be updated based on patient status, additional functional criteria and insurance authorization.  Follow Up Recommendations  Skilled nursing-short term rehab (<3 hours/day) Can patient physically be transported by private vehicle: No   Assistance Recommended at Discharge Frequent or constant Supervision/Assistance  Patient can return home with the following Two people to help with walking and/or transfers;Two people to help with bathing/dressing/bathroom;Help with stairs or ramp for entrance;Assist for transportation;Assistance with cooking/housework   Equipment Recommendations  Other (comment) (defer to SNF)    Recommendations for Other Services       Precautions / Restrictions Precautions Precautions: Fall Required Braces or Orthoses: Other Brace Other Brace: bil "post op" shoes Restrictions Weight Bearing Restrictions: No Other Position/Activity  Restrictions: WBAT per podiatry progress note     Mobility  Bed Mobility Overal bed mobility: Needs Assistance Bed Mobility: Supine to Sit     Supine to sit: Mod assist, +2 for safety/equipment, +2 for physical assistance     General bed mobility comments: assist to progress LEs off bed and elevate trunk. pt able to self assist lifting trunk by holding therapist's hand , bed pad used to complete trunk elevation and scooting to EOB    Transfers Overall transfer level: Needs assistance Equipment used: Rolling walker (2 wheels) Transfers: Sit to/from Stand, Bed to chair/wheelchair/BSC Sit to Stand: Mod assist, From elevated surface, +2 safety/equipment   Step pivot transfers: Mod assist, +2 safety/equipment, +2 physical assistance       General transfer comment: cues for hand placement and LE position. assist to rise, transition to RW, and stabilize; assist to balance and maneuver RW for step pivot to chair    Ambulation/Gait                   Stairs             Wheelchair Mobility    Modified Rankin (Stroke Patients Only)       Balance     Sitting balance-Leahy Scale: Fair       Standing balance-Leahy Scale: Poor Standing balance comment: reliant on UEs and external support                            Cognition Arousal/Alertness: Awake/alert Behavior During Therapy: WFL for tasks assessed/performed Overall Cognitive Status: Within Functional Limits for tasks assessed  General Comments: second toe of L foot bleeding, covered with gauze and paper tape to gauze not pt skin. RN present and aware        Exercises      General Comments        Pertinent Vitals/Pain Pain Assessment Pain Assessment: Faces Faces Pain Scale: Hurts even more Pain Location: feet and lower legs Pain Descriptors / Indicators: Aching, Sore, Tender Pain Intervention(s): Limited activity within patient's  tolerance, Monitored during session, Premedicated before session, Repositioned    Home Living                          Prior Function            PT Goals (current goals can now be found in the care plan section) Acute Rehab PT Goals Patient Stated Goal: get stronger,have less pain PT Goal Formulation: With patient Time For Goal Achievement: 01/22/22 Potential to Achieve Goals: Good Progress towards PT goals: Progressing toward goals    Frequency    Min 2X/week      PT Plan Current plan remains appropriate    Co-evaluation              AM-PAC PT "6 Clicks" Mobility   Outcome Measure  Help needed turning from your back to your side while in a flat bed without using bedrails?: A Lot Help needed moving from lying on your back to sitting on the side of a flat bed without using bedrails?: A Lot Help needed moving to and from a bed to a chair (including a wheelchair)?: Total Help needed standing up from a chair using your arms (e.g., wheelchair or bedside chair)?: Total Help needed to walk in hospital room?: Total Help needed climbing 3-5 steps with a railing? : Total 6 Click Score: 8    End of Session   Activity Tolerance: Patient tolerated treatment well;Patient limited by fatigue Patient left: in chair;with call bell/phone within reach (no alarm needed per staff) Nurse Communication: Mobility status PT Visit Diagnosis: Other abnormalities of gait and mobility (R26.89);Difficulty in walking, not elsewhere classified (R26.2);Muscle weakness (generalized) (M62.81)     Time: 5003-7048 PT Time Calculation (min) (ACUTE ONLY): 31 min  Charges:  $Therapeutic Activity: 23-37 mins                     Baxter Flattery, PT  Acute Rehab Dept [facility]) [phone]  [facility] Weekend Pager [facility] only)  [phone]  01/13/2022    [facility] 01/13/2022, 1:13 PM

## 2022-01-14 ENCOUNTER — Inpatient Hospital Stay (HOSPITAL_COMMUNITY): Payer: Medicare PPO

## 2022-01-14 DIAGNOSIS — E43 Unspecified severe protein-calorie malnutrition: Secondary | ICD-10-CM | POA: Diagnosis not present

## 2022-01-14 DIAGNOSIS — L03115 Cellulitis of right lower limb: Secondary | ICD-10-CM | POA: Diagnosis not present

## 2022-01-14 LAB — BASIC METABOLIC PANEL
Anion gap: 6 (ref 5–15)
BUN: 9 mg/dL (ref 8–23)
CO2: 28 mmol/L (ref 22–32)
Calcium: 7.4 mg/dL — ABNORMAL LOW (ref 8.9–10.3)
Chloride: 99 mmol/L (ref 98–111)
Creatinine, Ser: 0.66 mg/dL (ref 0.44–1.00)
GFR, Estimated: >60 mL/min (ref >60)
Glucose, Bld: 134 mg/dL — ABNORMAL HIGH (ref 70–99)
Potassium: 3.4 mmol/L — ABNORMAL LOW (ref 3.5–5.1)
Sodium: 133 mmol/L — ABNORMAL LOW (ref 135–145)

## 2022-01-14 LAB — MAGNESIUM: Magnesium: 1.6 mg/dL — ABNORMAL LOW (ref 1.7–2.4)

## 2022-01-14 LAB — PHOSPHORUS: Phosphorus: 1.9 mg/dL — ABNORMAL LOW (ref 2.5–4.6)

## 2022-01-14 MED ORDER — POTASSIUM CHLORIDE 10 MEQ/100ML IV SOLN
10.0000 meq | INTRAVENOUS | Status: AC
  Administered 2022-01-14 (×4): 10 meq via INTRAVENOUS
  Filled 2022-01-14 (×4): qty 100

## 2022-01-14 MED ORDER — POTASSIUM CHLORIDE CRYS ER 20 MEQ PO TBCR
40.0000 meq | EXTENDED_RELEASE_TABLET | Freq: Once | ORAL | Status: DC
  Filled 2022-01-14: qty 2

## 2022-01-14 MED ORDER — MAGNESIUM SULFATE 2 GM/50ML IV SOLN
2.0000 g | Freq: Once | INTRAVENOUS | Status: AC
  Administered 2022-01-14: 2 g via INTRAVENOUS
  Filled 2022-01-14: qty 50

## 2022-01-14 NOTE — Progress Notes (Signed)
PROGRESS NOTE  Carolyn [name]  KLK:[id number] DOB: Jul 22, 1955 DOA: 01/06/2022 PCP: Farris Has, MD   Brief Narrative:  Patient is a 66 year old female with history of chronic alcohol pancreatitis, chronic pain syndrome, GERD, severe malnutrition, osteoporosis who presented initially with right foot wound that he started as a blister/bulla.  Right foot x-ray raised concern for acute osteomyelitis but was asked to repeat MRI, started on antibiotics.  Evaluated by podiatry who recommended antibiotics for 7 days, wound care and outpatient follow-up.  Patient developed abdominal fullness for which KUB was done which showed a pleural effusion.  Chest x-ray confirmed large right pleural effusion.  IR did thoracentesis and was given IV Lasix and albumin.  Palliative care consulted for goals of care who recommend outpatient follow-up at a skilled nursing facility.  PT/OT recommending SNF on discharge.  Medically stable for discharge whenever possible.  Assessment & Plan:  Principal Problem:   Osteomyelitis (HCC) Active Problems:   Chronic pancreatitis (HCC)   Hypokalemia   Hypomagnesemia   AKI (acute kidney injury) (HCC)   Protein-calorie malnutrition, severe (HCC)   Chronic pain syndrome   Cellulitis of right foot   Normocytic anemia   Gastroesophageal reflux disease without esophagitis   Pressure injury of skin   Physical deconditioning   Pleural effusion   Abdominal distention   Acute respiratory failure with hypoxia (HCC)   Hypoalbuminemia   Anasarca   Acute hypoxic respiratory failure: Secondary to right pleural effusion, atelectasis.  Chest x-ray showed large pleural effusion.  BNP was also elevated.  No history of CHF.  Albumin was very low.  Status post thoracentesis with removal of 1.2 L of fluid.  Being given IV Lasix and IV albumin because she is tolerating with preserved kidney function and improvement in the respiratory status. .  Continue incentive spirometer, will continue to  try to wean the oxygen.  Echocardiogram has been ordered  Anasarca/hypoalbuminemia: Has dependent edema.  Likely nutritional.  Nutritionist consulted  Bilateral feet cellulitis/ulceration: X-ray of right foot raise concern for cellulitis but osteomyelitis ruled out by MRI.  No leukocytosis.  No plan for surgery, podiatry recommended 7 days course of antibiotics, wound care.  Ancef to be completed on 10/4.  Continue pain management, supportive care  AKI: Resolved  Abdominal distention: KUB raised concern for ileus.  Now having small bowel movements, passing gas.  Pain medication discontinued.  Continue home Butrans.  Continue bowel regimen  Electrolytes abnormalities: Continue to monitor and supplement as needed.  Supplemented with phosphorus, magnesium,K.  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  Continue home Butrans, fentanyl as needed, Creon  Lactacidosis: Likely type B.  Continue monitoring  Normocytic anemia: Currently hemoglobin in the range of 8, also developed mild thrombocytopenia.  We will continue to monitor.  Anemia panel suggests anemia of chronic disease.  Mild sinus tachycardia: Monitor on telemetry  GERD: Continue PPI  Vitamin D/A insufficiency: Being supplemented  Physical deconditioning/ambulatory dysfunction: PT/OT recommended SNF  Severe protein calorie malnutrition: nutrition is consulted.  BMI of 16.7.  This is likely associated with chronic pancreatitis, multiple abdominal surgeries  Goals of care: Palliative care also consulted here.  Recommend outpatient follow-up at the SNF  Pressure Injury 01/07/22 Coccyx Mid Stage 1 -  Intact skin with non-blanchable redness of a localized area usually over a bony prominence. (Active)  01/07/22 1848  Location: Coccyx  Location Orientation: Mid  Staging: Stage 1 -  Intact skin with  non-blanchable redness of a localized area  usually over a bony prominence.  Wound Description (Comments):   Present on Admission: Yes            Nutrition Problem: Severe Malnutrition Etiology: chronic illness (chronic pancreatitis, h/o multiple gastric surgeries including gastric bypass) Pressure Injury 01/07/22 Coccyx Mid Stage 1 -  Intact skin with non-blanchable redness of a localized area usually over a bony prominence. (Active)  01/07/22 1848  Location: Coccyx  Location Orientation: Mid  Staging: Stage 1 -  Intact skin with non-blanchable redness of a localized area usually over a bony prominence.  Wound Description (Comments):   Present on Admission: Yes  Dressing Type Foam - Lift dressing to assess site every shift 01/13/22 0900    DVT prophylaxis:enoxaparin (LOVENOX) injection 40 mg Start: 01/07/22 2000     Code Status: Full Code  Family Communication: None at bedside  Patient status:Inpatient  Patient is from :Home  Anticipated discharge to:SNF  Estimated DC date:waiting for bed   Consultants: Palliative care  Procedures:None  Antimicrobials:  Anti-infectives (From admission, onward)    Start     Dose/Rate Route Frequency Ordered Stop   01/09/22 1800  ceFAZolin (ANCEF) IVPB 1 g/50 mL premix        1 g 100 mL/hr over 30 Minutes Intravenous Every 8 hours 01/09/22 1513 01/16/22 1759   01/06/22 2030  cefTRIAXone (ROCEPHIN) 2 g in sodium chloride 0.9 % 100 mL IVPB  Status:  Discontinued       See Hyperspace for full Linked Orders Report.   2 g 200 mL/hr over 30 Minutes Intravenous Every 24 hours 01/06/22 2017 01/09/22 1513   01/06/22 2030  metroNIDAZOLE (FLAGYL) IVPB 500 mg  Status:  Discontinued       See Hyperspace for full Linked Orders Report.   500 mg 100 mL/hr over 60 Minutes Intravenous Every 12 hours 01/06/22 2017 01/09/22 1513       Subjective: Patient seen and examined at bedside today.  She looks comfortable.  She was on room air.  She feels better today.  Denies any abdominal  pain, nausea or vomiting.  Has improved appetite.  Denies any shortness of breath but has some cough  Objective: Vitals:   01/13/22 0329 01/13/22 1213 01/13/22 2055 01/14/22 0440  BP: 123/71 113/66 120/74 104/71  Pulse: 80 99 100 (!) 104  Resp: 18 16 19 17   Temp: 97.8 F (36.6 C) 98.4 F (36.9 C) 98 F (36.7 C) 98.2 F (36.8 C)  TempSrc: Oral Oral Oral Oral  SpO2: 94% 99% 100% 98%  Weight:      Height:        Intake/Output Summary (Last 24 hours) at 01/14/2022 1202 Last data filed at 01/14/2022 1016 Gross per 24 hour  Intake 190 ml  Output 1700 ml  Net -1510 ml   Filed Weights   01/06/22 1857  Weight: 49.9 kg    Examination:  General exam: Very weak, cachectic, noticed HEENT: PERRL Respiratory system: Diminished breath sounds on the right side Cardiovascular system: S1 & S2 heard, RRR.  Gastrointestinal system: Abdomen is nondistended, soft and nontender. Central nervous system: Alert and oriented Extremities: Bilateral upper extremity edema, no clubbing ,no cyanosis Skin: Multiple skin breakdown, pressure ulcer   Data Reviewed: I have personally reviewed following labs and imaging studies  CBC: Recent Labs  Lab 01/09/22 0955 01/10/22 0401 01/11/22 0840 01/12/22 0427 01/13/22 0257  WBC 9.4 11.5* 8.9 11.9* 6.1  HGB 10.0* 9.8* 8.5* 10.8* 8.0*  HCT 30.7* 29.3* 25.2* 32.7* 24.6*  MCV 95.9 95.4 94.7 95.3 96.5  PLT 136* 132* 108* 129* 94*   Basic Metabolic Panel: Recent Labs  Lab 01/10/22 0401 01/11/22 0840 01/12/22 0427 01/13/22 0257 01/14/22 0238  NA 131* 131* 131* 131* 133*  K 4.3 4.2 4.3 3.9 3.4*  CL 106 104 102 101 99  CO2 24 24 23 26 28   GLUCOSE 148* 151* 130* 103* 134*  BUN 9 8 8 8 9   CREATININE 0.67 0.67 0.75 0.63 0.66  CALCIUM 7.0* 6.9* 7.3* 7.0* 7.4*  MG 1.7 1.6* 2.0 1.4* 1.6*  PHOS 1.7* 2.2* 1.8* 1.7* 1.9*     Recent Results (from the past 240 hour(s))  Culture, blood (routine x 2)     Status: None   Collection Time: 01/06/22  8:25  PM   Specimen: BLOOD  Result Value Ref Range Status   Specimen Description   Final    BLOOD RIGHT ANTECUBITAL Performed at [facility], [address] [place], [address] [place]    Special Requests   Final    BOTTLES DRAWN AEROBIC AND ANAEROBIC Blood Culture adequate volume Performed at [facility], [address] [place], [address] [place]    Culture   Final    NO GROWTH 5 DAYS Performed at [facility], [address], [place], [address] [place]    Report Status 01/11/2022 FINAL  Final  Culture, blood (routine x 2)     Status: None   Collection Time: 01/07/22  6:51 PM   Specimen: BLOOD  Result Value Ref Range Status   Specimen Description   Final    BLOOD SITE NOT SPECIFIED Performed at [facility], [address], [place], [place]    Special Requests   Final    BOTTLES DRAWN AEROBIC ONLY Blood Culture adequate volume Performed at [facility], [address], [place], [place]    Culture   Final    NO GROWTH 5 DAYS Performed at [facility], [address], [place], [address] [place]    Report Status 01/12/2022 FINAL  Final  Culture, blood (Routine X 2) w Reflex to ID Panel     Status: None   Collection Time: 01/07/22  6:51 PM   Specimen: BLOOD  Result Value Ref Range Status   Specimen Description   Final    BLOOD SITE NOT SPECIFIED Performed at [facility], [address] [place], [place]    Special Requests   Final    BOTTLES DRAWN AEROBIC ONLY Blood Culture adequate volume Performed at [facility], [address] [place], [place]    Culture   Final    NO GROWTH 5 DAYS Performed at [facility], [address], [place], [address] [place]    Report Status 01/12/2022 FINAL  Final  Body fluid culture w Gram Stain     Status: None (Preliminary result)   Collection Time: 01/12/22 12:30 PM   Specimen:  Peritoneal Washings  Result Value Ref Range Status   Specimen Description   Final    PERITONEAL Performed at [facility], [address], [place], [place]    Special Requests   Final    NONE Performed at [facility], [address] [place], [place]    Gram Stain   Final    RARE WBC PRESENT,BOTH  PMN AND MONONUCLEAR NO ORGANISMS SEEN    Culture   Final    NO GROWTH 2 DAYS Performed at [facility], [place] [address], [place], [place] [address]    Report Status PENDING  Incomplete     Radiology Studies: No results found.  Scheduled Meds:  ascorbic acid  500 mg Oral BID   buprenorphine  4 patch Transdermal Weekly   Chlorhexidine Gluconate Cloth  6 each Topical Daily   docusate sodium  100 mg Oral BID   enoxaparin (LOVENOX) injection  40 mg Subcutaneous Q24H   feeding supplement  237 mL Oral TID BM   furosemide  40 mg Intravenous Daily   lipase/protease/amylase  24,000 Units Oral TID WC   multivitamin with minerals  1 tablet Oral Daily   nortriptyline  30 mg Oral QHS   pantoprazole  40 mg Oral Daily   phosphorus  500 mg Oral TID   silver sulfADIAZINE   Topical BID   sodium chloride flush  10-40 mL Intracatheter Q12H   vitamin A  100,000 Units Oral Daily   Followed by   Derrill Memo ON 01/16/2022] vitamin A  50,000 Units Oral Daily   Vitamin D (Ergocalciferol)  50,000 Units Oral Q7 days   zinc sulfate  220 mg Oral Daily   Continuous Infusions:  albumin human 25 g (01/13/22 1210)    ceFAZolin (ANCEF) IV 1 g (01/14/22 1020)   potassium chloride 10 mEq (01/14/22 1126)     LOS: 7 days   Shelly Coss, MD [facility] P11/04/2021, 12:02 PM

## 2022-01-14 NOTE — Progress Notes (Signed)
Occupational Therapy Treatment Patient Details Name: Carolyn [name] MRN: [id number] DOB: 1955/11/01 Today's Date: 01/14/2022   History of present illness 66 y.o. female nondiabetic, not a smoker, malnourished, presenting for bilateral foot cellulitis/with ulcers.  Most concerning the right second toe.  X-ray raises concern for cellulitis and osteomyelitis but negative on more sensitive MRI.  Podiatry consulted. PMH: ETOH use, cirrhosis, liver disease, malabsorption syndrome, pancreatitis, Szs   OT comments  Pt was motivated to participate in the session. She reported having 5/10 distal B LE pain while at rest, as well as general feelings of weakness. She required increased time and effort with mod assist to perform supine to sit. She presented with good sitting balance EOB, while performing simple grooming with set-up assist; she needed max assist for donning her shoes. She stood 2 times using a RW and was able to take a few lateral steps towards the head of the bed, using a RW for support. She will continue to benefit from further OT services to maximize her safety and independence with self-care tasks.    Recommendations for follow up therapy are one component of a multi-disciplinary discharge planning process, led by the attending physician.  Recommendations may be updated based on patient status, additional functional criteria and insurance authorization.    Follow Up Recommendations  Skilled nursing-short term rehab (<3 hours/day)    Assistance Recommended at Discharge Frequent or constant Supervision/Assistance     Equipment Recommendations  Other (comment) (TBD pending progress at next setting)       Precautions / Restrictions Precautions Precautions: Fall Other Brace: bilateral "post op" shoes Restrictions Weight Bearing Restrictions: Yes Other Position/Activity Restrictions: WBAT per podiatry progress note       Mobility Bed Mobility Overal bed mobility: Needs Assistance Bed  Mobility: Supine to Sit     Supine to sit: Mod assist, HOB elevated     General bed mobility comments: required increased time and effort and instruction on advancing B LE and reaching for bed rail    Transfers Overall transfer level: Needs assistance Equipment used: Rolling walker (2 wheels) Transfers: Sit to/from Stand Sit to Stand: Mod assist, From elevated surface           General transfer comment: She performed 2 total stands, requiring a seated rest break between stands. She was instructed to push up from the bed with at least 1 upper extremity, as well as to demo upright posture in standing. She required min assist to take lateral steps towards the [facility] using a RW     Balance              ADL either performed or assessed with clinical judgement   ADL Overall ADL's : Needs assistance/impaired Eating/Feeding: Independent   Grooming: Set up;Sitting Grooming Details (indicate cue type and reason): She performed face washing seated EOB         Upper Body Dressing : Supervision/safety;Set up;Sitting   Lower Body Dressing: Maximal assistance              Vision Baseline Vision/History: 1 Wears glasses            Cognition Arousal/Alertness: Awake/alert Behavior During Therapy: WFL for tasks assessed/performed Overall Cognitive Status: Within Functional Limits for tasks assessed                        Pertinent Vitals/ Pain       Pain Assessment Pain Assessment: 0-10 Pain Score: 5  Pain  Location: feet and lower legs Pain Intervention(s): Repositioned, Limited activity within patient's tolerance         Frequency  Min 2X/week        Progress Toward Goals  OT Goals(current goals can now be found in the care plan section)  Progress towards OT goals: Progressing toward goals  Acute Rehab OT Goals OT Goal Formulation: With patient Time For Goal Achievement: 01/23/22 Potential to Achieve Goals: Good  Plan Discharge plan remains  appropriate       AM-PAC OT "6 Clicks" Daily Activity     Outcome Measure   Help from another person eating meals?: None Help from another person taking care of personal grooming?: A Little Help from another person toileting, which includes using toliet, bedpan, or urinal?: A Lot Help from another person bathing (including washing, rinsing, drying)?: A Lot Help from another person to put on and taking off regular upper body clothing?: A Little Help from another person to put on and taking off regular lower body clothing?: A Lot 6 Click Score: 16    End of Session Equipment Utilized During Treatment: Rolling walker (2 wheels)  OT Visit Diagnosis: Pain;Muscle weakness (generalized) (M62.81);Unsteadiness on feet (R26.81)   Activity Tolerance Patient limited by pain   Patient Left in bed;with call bell/phone within reach;with bed alarm set           Time: 1710-1730 OT Time Calculation (min): 20 min  Charges: OT General Charges $OT Visit: 1 Visit OT Treatments $Therapeutic Activity: 8-22 mins     Leota Sauers, OTR/L 01/14/2022, 5:41 PM

## 2022-01-14 NOTE — Progress Notes (Signed)
  Echocardiogram 2D Echocardiogram attempted again at 1247 and 1403. Pt is receiving care at this time. Will attempt again as schedule permits.  [name] 01/14/2022, 2:04 PM

## 2022-01-14 NOTE — Progress Notes (Signed)
  Echocardiogram 2D Echocardiogram attempted at 0831. Pt is eating. Will attempt later.  [name] 01/14/2022, 8:31 AM

## 2022-01-14 NOTE — TOC Progression Note (Signed)
Transition of Care [facility]) - Progression Note    Patient Details  Name: Carolyn [name] MRN: [id number] Date of Birth: 11-Aug-1955  Transition of [facility]) CM/SW Contact  Leeroy Cha, RN Phone Number: 01/14/2022, 12:35 PM  Clinical Narrative:     Phoebe Perch done three days ago but not sent out.  [facility] are in the system but no fl2 attached.  Fl2 sent out       Expected Discharge Plan and Services                                                 Social Determinants of Health (SDOH) Interventions    Readmission Risk Interventions   No data to display

## 2022-01-15 ENCOUNTER — Inpatient Hospital Stay (HOSPITAL_COMMUNITY): Payer: Medicare PPO

## 2022-01-15 DIAGNOSIS — R008 Other abnormalities of heart beat: Secondary | ICD-10-CM

## 2022-01-15 DIAGNOSIS — E43 Unspecified severe protein-calorie malnutrition: Secondary | ICD-10-CM | POA: Diagnosis not present

## 2022-01-15 LAB — BASIC METABOLIC PANEL
Anion gap: 6 (ref 5–15)
BUN: 9 mg/dL (ref 8–23)
CO2: 31 mmol/L (ref 22–32)
Calcium: 7 mg/dL — ABNORMAL LOW (ref 8.9–10.3)
Chloride: 98 mmol/L (ref 98–111)
Creatinine, Ser: 0.57 mg/dL (ref 0.44–1.00)
GFR, Estimated: >60 mL/min (ref >60)
Glucose, Bld: 167 mg/dL — ABNORMAL HIGH (ref 70–99)
Potassium: 3.4 mmol/L — ABNORMAL LOW (ref 3.5–5.1)
Sodium: 135 mmol/L (ref 135–145)

## 2022-01-15 LAB — ECHOCARDIOGRAM COMPLETE
AR max vel: 3.67 cm2
AV Peak grad: 13.8 mmHg
Ao pk vel: 1.86 m/s
Area-P 1/2: 5.46 cm2
Height: 68 in
S' Lateral: 1.95 cm
Weight: 1760 oz

## 2022-01-15 LAB — MAGNESIUM: Magnesium: 1.8 mg/dL (ref 1.7–2.4)

## 2022-01-15 LAB — PHOSPHORUS: Phosphorus: 2.5 mg/dL (ref 2.5–4.6)

## 2022-01-15 MED ORDER — GUAIFENESIN-DM 100-10 MG/5ML PO SYRP
10.0000 mL | ORAL_SOLUTION | Freq: Four times a day (QID) | ORAL | Status: DC | PRN
  Administered 2022-01-16 – 2022-01-17 (×3): 10 mL via ORAL
  Filled 2022-01-15 (×3): qty 10

## 2022-01-15 MED ORDER — TIZANIDINE HCL 4 MG PO TABS
2.0000 mg | ORAL_TABLET | ORAL | Status: AC | PRN
  Administered 2022-01-16: 2 mg via ORAL
  Filled 2022-01-15: qty 1

## 2022-01-15 MED ORDER — POTASSIUM CHLORIDE CRYS ER 20 MEQ PO TBCR: 40.0000 meq | EXTENDED_RELEASE_TABLET | Freq: Once | ORAL | Status: DC

## 2022-01-15 MED ORDER — TIZANIDINE HCL 4 MG PO TABS: 2.0000 mg | ORAL_TABLET | Freq: Once | ORAL | Status: DC

## 2022-01-15 MED ORDER — POTASSIUM CHLORIDE 10 MEQ/50ML IV SOLN
10.0000 meq | INTRAVENOUS | Status: AC
  Administered 2022-01-15 (×4): 10 meq via INTRAVENOUS
  Filled 2022-01-15 (×4): qty 50

## 2022-01-15 NOTE — Progress Notes (Addendum)
Physical Therapy Treatment Patient Details Name: Carolyn [name] MRN: [id number] DOB: [birth year] Today's Date: 01/15/2022   History of Present Illness 66 y.o. female nondiabetic, not a smoker, malnourished, presenting for bilateral foot cellulitis/with ulcers.  Most concerning the right second toe.  X-ray raises concern for cellulitis and osteomyelitis but negative on more sensitive MRI.  Podiatry consulted. PMH: ETOH use, cirrhosis, liver disease, malabsorption syndrome, pancreatitis, Szs    PT Comments    Pt continues to progress toward acute PT goals this session with progression to ambulation. Pt required MOD A for bed mobility and MOD A+2 for power up to stand. Pt ambulated ~29ft with MOD A, intermittent MIN A and close chair follow for safety. HR up to 136bpm during session, O2 >90% on RA throughout.  RN updated on pt performance and RN and pt encouraged for use of BSC vs. Bedpan and purewick for increased mobility. Pt will benefit from continued skilled PT to increase independence and maximize safety with mobility.     Recommendations for follow up therapy are one component of a multi-disciplinary discharge planning process, led by the attending physician.  Recommendations may be updated based on patient status, additional functional criteria and insurance authorization.  Follow Up Recommendations  Skilled nursing-short term rehab (<3 hours/day) Can patient physically be transported by private vehicle: Yes   Assistance Recommended at Discharge Frequent or constant Supervision/Assistance  Patient can return home with the following Two people to help with walking and/or transfers;Two people to help with bathing/dressing/bathroom;Help with stairs or ramp for entrance;Assist for transportation;Assistance with cooking/housework   Equipment Recommendations  Other (comment) (defer to next level of care)    Recommendations for Other Services       Precautions / Restrictions  Precautions Precautions: Fall Other Brace: bilateral "post op" shoes Restrictions Weight Bearing Restrictions: No Other Position/Activity Restrictions: WBAT per podiatry progress note     Mobility  Bed Mobility Overal bed mobility: Needs Assistance Bed Mobility: Supine to Sit     Supine to sit: Mod assist, HOB elevated     General bed mobility comments: required increased time; use of  bed rails, assist for trunk to upright and intermittent assist for bringing LEs to EOB.    Transfers Overall transfer level: Needs assistance Equipment used: Rolling walker (2 wheels) Transfers: Sit to/from Stand Sit to Stand: Mod assist, From elevated surface, +2 physical assistance           General transfer comment: x2- from EOB and BSC. cues for proper hand placement, scooting ot edge of surface,a and anterior weight shift. able to perform step transfer to recliner, agreeable to attempt forward ambulation.    Ambulation/Gait Ambulation/Gait assistance: Mod assist Gait Distance (Feet): 12 Feet Assistive device: Rolling walker (2 wheels) Gait Pattern/deviations: Step-to pattern, Decreased stride length, Knee flexed in stance - right, Knee flexed in stance - left, Trunk flexed, Narrow base of support Gait velocity: decreased     General Gait Details: intermittent MIN A. Close chair follow provided for safety. Pt deferring further ambulation distance due to pain/fatigue. Pt reports biggest concern is pain and UE muscle weakness . HR up to 136bpm during mobility. 113bpm at end of session.   Stairs             Wheelchair Mobility    Modified Rankin (Stroke Patients Only)       Balance Overall balance assessment: History of Falls, Needs assistance Sitting-balance support: Feet supported Sitting balance-Leahy Scale: Fair     Standing balance  support: Bilateral upper extremity supported, Reliant on assistive device for balance Standing balance-Leahy Scale: Poor Standing  balance comment: reliant on UEs and external support                            Cognition Arousal/Alertness: Awake/alert Behavior During Therapy: WFL for tasks assessed/performed Overall Cognitive Status: Within Functional Limits for tasks assessed                                          Exercises      General Comments General comments (skin integrity, edema, etc.): Pt on 2L upon entry, O2 >90% throughout session. RN updated and notified that pt left on RA.      Pertinent Vitals/Pain Pain Assessment Pain Assessment: 0-10 Pain Score: 7  Pain Location: feet and lower legs Pain Intervention(s): Limited activity within patient's tolerance, Monitored during session, Repositioned    Home Living                          Prior Function            PT Goals (current goals can now be found in the care plan section) Acute Rehab PT Goals Patient Stated Goal: get stronger,have less pain PT Goal Formulation: With patient Time For Goal Achievement: 01/22/22 Potential to Achieve Goals: Good Progress towards PT goals: Progressing toward goals    Frequency    Min 2X/week      PT Plan Current plan remains appropriate    Co-evaluation              AM-PAC PT "6 Clicks" Mobility   Outcome Measure  Help needed turning from your back to your side while in a flat bed without using bedrails?: A Little Help needed moving from lying on your back to sitting on the side of a flat bed without using bedrails?: A Lot Help needed moving to and from a bed to a chair (including a wheelchair)?: Total Help needed standing up from a chair using your arms (e.g., wheelchair or bedside chair)?: Total Help needed to walk in hospital room?: A Lot Help needed climbing 3-5 steps with a railing? : Total 6 Click Score: 10    End of Session Equipment Utilized During Treatment: Gait belt Activity Tolerance: Patient tolerated treatment well;Patient limited by  fatigue Patient left: in chair;with call bell/phone within reach (no chair alarm needed per RN) Nurse Communication: Mobility status PT Visit Diagnosis: Other abnormalities of gait and mobility (R26.89);Difficulty in walking, not elsewhere classified (R26.2);Muscle weakness (generalized) (M62.81)     Time: 9381-0175 PT Time Calculation (min) (ACUTE ONLY): 26 min  Charges:  $Therapeutic Activity: 23-37 mins                     Lyman Speller PT, DPT  Acute Rehabilitation Services  Office [phone]   01/15/2022, 12:24 PM

## 2022-01-15 NOTE — Progress Notes (Signed)
Echocardiogram 2D Echocardiogram has been performed.  Oneal Deputy Mckell Riecke RDCS 01/15/2022, 8:20 AM

## 2022-01-15 NOTE — Progress Notes (Addendum)
PROGRESS NOTE  Carolyn [name]  YQM:[id number] DOB: [birth year] DOA: 01/06/2022 PCP: Farris Has, MD   Brief Narrative:  Patient is a 65 year old female with history of chronic alcohol pancreatitis, chronic pain syndrome, GERD, severe malnutrition, osteoporosis who presented initially with right foot wound that he started as a blister/bulla.  Right foot x-ray raised concern for acute osteomyelitis but was asked to repeat MRI, started on antibiotics.  Evaluated by podiatry who recommended antibiotics for 7 days, wound care and outpatient follow-up.  Patient developed abdominal fullness for which KUB was done which showed a pleural effusion.  Chest x-ray confirmed large right pleural effusion.  IR did thoracentesis and was given IV Lasix and albumin.  Palliative care consulted for goals of care who recommend outpatient follow-up at a skilled nursing facility.  PT/OT recommending SNF on discharge.  Medically stable for discharge whenever possible.  Assessment & Plan:  Principal Problem:   Protein-calorie malnutrition, severe (HCC) Active Problems:   Chronic pancreatitis (HCC)   Osteomyelitis (HCC)   Hypokalemia   Hypomagnesemia   AKI (acute kidney injury) (HCC)   Chronic pain syndrome   Cellulitis of right foot   Normocytic anemia   Gastroesophageal reflux disease without esophagitis   Pressure injury of skin   Physical deconditioning   Pleural effusion   Abdominal distention   Acute respiratory failure with hypoxia (HCC)   Hypoalbuminemia   Anasarca   Acute hypoxic respiratory failure: Secondary to right pleural effusion, atelectasis.  Chest x-ray showed large pleural effusion.  BNP was also elevated.  No history of CHF.  Albumin was very low.  Status post thoracentesis with removal of 1.2 L of fluid.  Being given IV Lasix and IV albumin because she is tolerating with preserved kidney function and improvement in the respiratory status. .  Continue incentive spirometer, will continue to  try to wean the oxygen.  On 2 L.Echocardiogram showed EF of 70 to 75%, grade 1 diastolic dysfunction  Anasarca/hypoalbuminemia: Has dependent edema.  Likely nutritional.  Nutritionist consulted  Bilateral feet cellulitis/ulceration: X-ray of right foot raise concern for cellulitis but osteomyelitis ruled out by MRI.  No leukocytosis.  No plan for surgery, podiatry recommended 7 days course of antibiotics, wound care.  Ancef to be completed on 10/4.  Continue pain management, supportive care  AKI: Resolved  Abdominal distention: KUB raised concern for ileus.  Now having small bowel movements, passing gas.  Pain medication discontinued.  Continue home Butrans.  Continue bowel regimen  Electrolytes abnormalities: Continue to monitor and supplement as needed.  Supplemented with phosphorus, magnesium,K.  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  Continue home Butrans, fentanyl as needed, Creon  Lactacidosis: Likely type B.  Continue monitoring  Normocytic anemia: Currently hemoglobin in the range of 8, also developed mild thrombocytopenia.  We will continue to monitor.  Anemia panel suggests anemia of chronic disease.  Mild sinus tachycardia: Monitor on telemetry  GERD: Continue PPI  Vitamin D/A insufficiency: Being supplemented  Physical deconditioning/ambulatory dysfunction: PT/OT recommended SNF  Severe protein calorie malnutrition: nutrition is consulted.  BMI of 16.7.  This is likely associated with chronic pancreatitis, multiple abdominal surgeries  Goals of care: Palliative care also consulted here.  Recommend outpatient follow-up at the SNF  Pressure Injury 01/07/22 Coccyx Mid Stage 1 -  Intact skin with non-blanchable redness of a localized area usually over a bony prominence. (Active)  01/07/22 1848  Location: Coccyx  Location Orientation: Mid  Staging: Stage 1 -  Intact skin with  non-blanchable redness of a localized area usually over a bony prominence.  Wound Description (Comments):   Present on Admission: Yes            Nutrition Problem: Severe Malnutrition Etiology: chronic illness (chronic pancreatitis, h/o multiple gastric surgeries including gastric bypass) Pressure Injury 01/07/22 Coccyx Mid Stage 1 -  Intact skin with non-blanchable redness of a localized area usually over a bony prominence. (Active)  01/07/22 1848  Location: Coccyx  Location Orientation: Mid  Staging: Stage 1 -  Intact skin with non-blanchable redness of a localized area usually over a bony prominence.  Wound Description (Comments):   Present on Admission: Yes  Dressing Type Foam - Lift dressing to assess site every shift 01/14/22 2044    DVT prophylaxis:enoxaparin (LOVENOX) injection 40 mg Start: 01/07/22 2000     Code Status: Full Code  Family Communication: Husband on phone on 11/3 Patient status:Inpatient  Patient is from :Home  Anticipated discharge to:SNF  Estimated DC date:waiting for bed   Consultants: Palliative care  Procedures:None  Antimicrobials:  Anti-infectives (From admission, onward)    Start     Dose/Rate Route Frequency Ordered Stop   01/09/22 1800  ceFAZolin (ANCEF) IVPB 1 g/50 mL premix        1 g 100 mL/hr over 30 Minutes Intravenous Every 8 hours 01/09/22 1513 01/16/22 1759   01/06/22 2030  cefTRIAXone (ROCEPHIN) 2 g in sodium chloride 0.9 % 100 mL IVPB  Status:  Discontinued       See Hyperspace for full Linked Orders Report.   2 g 200 mL/hr over 30 Minutes Intravenous Every 24 hours 01/06/22 2017 01/09/22 1513   01/06/22 2030  metroNIDAZOLE (FLAGYL) IVPB 500 mg  Status:  Discontinued       See Hyperspace for full Linked Orders Report.   500 mg 100 mL/hr over 60 Minutes Intravenous Every 12 hours 01/06/22 2017 01/09/22 1513       Subjective: Patient seen and examined at the bedside today.  Hemodynamically stable.  Lies in bed.   Looks weak, deconditioned but overall comfortable and says she is willing to participate with physical therapy today.  She is still bothered by dry cough.  She is on 2 L of oxygen per minute.  She does not have any complaints  Objective: Vitals:   01/14/22 1232 01/14/22 2049 01/15/22 0531 01/15/22 0534  BP: 93/67 111/66 (!) 99/57   Pulse: 93 98 100   Resp:  16 17   Temp: 98.1 F (36.7 C) 98.2 F (36.8 C) 98.2 F (36.8 C)   TempSrc: Oral Oral Oral   SpO2: 94% 95% (!) 86% 95%  Weight:      Height:        Intake/Output Summary (Last 24 hours) at 01/15/2022 1142 Last data filed at 01/15/2022 0414 Gross per 24 hour  Intake 934.03 ml  Output 750 ml  Net 184.03 ml   Filed Weights   01/06/22 1857  Weight: 49.9 kg    Examination:   General exam: Very weak, deconditioned, cachectic HEENT: PERRL Respiratory system: Mildly diminished air sounds bilaterally but no wheezes or crackles Cardiovascular system: S1 & S2 heard, RRR.  Gastrointestinal system: Abdomen is nondistended, soft and nontender. Central nervous system: Alert and oriented Extremities: Bilateral upper extremity edema, no clubbing ,no cyanosis, and the right foot Skin: Multiple skin breakdown,pressure  ulcers   Data Reviewed: I have personally reviewed following labs and imaging studies  CBC: Recent Labs  Lab 01/09/22 0955 01/10/22 0401 01/11/22 0840 01/12/22 0427 01/13/22 0257  WBC 9.4 11.5* 8.9 11.9* 6.1  HGB 10.0* 9.8* 8.5* 10.8* 8.0*  HCT 30.7* 29.3* 25.2* 32.7* 24.6*  MCV 95.9 95.4 94.7 95.3 96.5  PLT 136* 132* 108* 129* 94*   Basic Metabolic Panel: Recent Labs  Lab 01/11/22 0840 01/12/22 0427 01/13/22 0257 01/14/22 0238 01/15/22 0450  NA 131* 131* 131* 133* 135  K 4.2 4.3 3.9 3.4* 3.4*  CL 104 102 101 99 98  CO2 24 23 26 28 31   GLUCOSE 151* 130* 103* 134* 167*  BUN 8 8 8 9 9   CREATININE 0.67 0.75 0.63 0.66 0.57  CALCIUM 6.9* 7.3* 7.0* 7.4* 7.0*  MG 1.6* 2.0 1.4* 1.6* 1.8  PHOS 2.2* 1.8*  1.7* 1.9* 2.5     Recent Results (from the past 240 hour(s))  Culture, blood (routine x 2)     Status: None   Collection Time: 01/06/22  8:25 PM   Specimen: BLOOD  Result Value Ref Range Status   Specimen Description   Final    BLOOD RIGHT ANTECUBITAL Performed at [facility], [address] [place], [address] [place]    Special Requests   Final    BOTTLES DRAWN AEROBIC AND ANAEROBIC Blood Culture adequate volume Performed at [facility], [address] [place], [address] [place]    Culture   Final    NO GROWTH 5 DAYS Performed at [facility], [address] [place], [address] [place]    Report Status 01/11/2022 FINAL  Final  Culture, blood (routine x 2)     Status: None   Collection Time: 01/07/22  6:51 PM   Specimen: BLOOD  Result Value Ref Range Status   Specimen Description   Final    BLOOD SITE NOT SPECIFIED Performed at [facility], [address] [place], [place]    Special Requests   Final    BOTTLES DRAWN AEROBIC ONLY Blood Culture adequate volume Performed at [facility] [address] [place], [place]    Culture   Final    NO GROWTH 5 DAYS Performed at [facility], [address] [place], [address] [place]    Report Status 01/12/2022 FINAL  Final  Culture, blood (Routine X 2) w Reflex to ID Panel     Status: None   Collection Time: 01/07/22  6:51 PM   Specimen: BLOOD  Result Value Ref Range Status   Specimen Description   Final    BLOOD SITE NOT SPECIFIED Performed at [facility], [address] [place], [place]    Special Requests   Final    BOTTLES DRAWN AEROBIC ONLY Blood Culture adequate volume Performed at [facility], [address], [place], [place]    Culture   Final    NO GROWTH 5 DAYS Performed at [facility], [address] [place], [address] [place]    Report Status  01/12/2022 FINAL  Final  Body fluid culture w Gram Stain     Status: None (Preliminary result)   Collection Time: 01/12/22 12:30 PM   Specimen: Peritoneal Washings  Result Value Ref Range Status   Specimen Description   Final    PERITONEAL Performed at [facility], [address] [place], [place]    Special Requests   Final  NONE Performed at [facility], [address] [place], [place] [address]    Gram Stain   Final    RARE WBC PRESENT,BOTH PMN AND MONONUCLEAR NO ORGANISMS SEEN    Culture   Final    NO GROWTH 3 DAYS Performed at [facility], [address] [place], [place] [address]    Report Status PENDING  Incomplete     Radiology Studies: ECHOCARDIOGRAM COMPLETE  Result Date: 01/15/2022    ECHOCARDIOGRAM REPORT   Patient Name:   Carolyn [name] Date of Exam: 01/15/2022 Medical Rec #:  [id number]      Height:       68.0 in Accession #:    [id number]     Weight:       110.0 lb Date of Birth:  Jul 11, 1955      BSA:          1.585 m Patient Age:    66 years       BP:           99/57 mmHg Patient Gender: F              HR:           94 bpm. Exam Location:  Inpatient Procedure: 2D Echo, Color Doppler and Cardiac Doppler Indications:    Other Abnormalities of the Heart R00.8  History:        Patient has no prior history of Echocardiogram examinations.                 Risk Factors:ETOH.  Sonographer:    Irving BurtonEmily Senior RDCS Referring Phys: [id number] Boyce MediciAYE T GONFA IMPRESSIONS  1. Vigorous LV systolic function; proximal septal thickening with systolic anterior motion of mitral valve with LVOT gradient of 3.5 m/s; suggest cardiac MRI to R/O HCM.  2. Left ventricular ejection fraction, by estimation, is 70 to 75%. The left ventricle has hyperdynamic function. The left ventricle has no regional wall motion abnormalities. There is mild left ventricular hypertrophy of the basal-septal segment. Left ventricular diastolic parameters are consistent  with Grade I diastolic dysfunction (impaired relaxation).  3. Right ventricular systolic function is normal. The right ventricular size is normal. There is normal pulmonary artery systolic pressure.  4. The mitral valve is normal in structure. Trivial mitral valve regurgitation. No evidence of mitral stenosis.  5. The aortic valve is tricuspid. Aortic valve regurgitation is trivial. Aortic valve sclerosis is present, with no evidence of aortic valve stenosis.  6. The inferior vena cava is normal in size with greater than 50% respiratory variability, suggesting right atrial pressure of 3 mmHg. Comparison(s): No prior Echocardiogram. FINDINGS  Left Ventricle: Left ventricular ejection fraction, by estimation, is 70 to 75%. The left ventricle has hyperdynamic function. The left ventricle has no regional wall motion abnormalities. The left ventricular internal cavity size was normal in size. There is mild left ventricular hypertrophy of the basal-septal segment. Left ventricular diastolic parameters are consistent with Grade I diastolic dysfunction (impaired relaxation). Right Ventricle: The right ventricular size is normal. Right ventricular systolic function is normal. There is normal pulmonary artery systolic pressure. The tricuspid regurgitant velocity is 2.21 m/s, and with an assumed right atrial pressure of 3 mmHg,  the estimated right ventricular systolic pressure is 22.5 mmHg. Left Atrium: Left atrial size was normal in size. Right Atrium: Right atrial size was normal in size. Pericardium: There is no evidence of pericardial effusion. Mitral Valve: The mitral valve is normal in structure.  Mild mitral annular calcification. Trivial mitral valve regurgitation. No evidence of mitral valve stenosis. Tricuspid Valve: The tricuspid valve is normal in structure. Tricuspid valve regurgitation is trivial. No evidence of tricuspid stenosis. Aortic Valve: The aortic valve is tricuspid. Aortic valve regurgitation is  trivial. Aortic valve sclerosis is present, with no evidence of aortic valve stenosis. Aortic valve peak gradient measures 13.8 mmHg. Pulmonic Valve: The pulmonic valve was normal in structure. Pulmonic valve regurgitation is trivial. No evidence of pulmonic stenosis. Aorta: The aortic root is normal in size and structure. Venous: The inferior vena cava is normal in size with greater than 50% respiratory variability, suggesting right atrial pressure of 3 mmHg. IAS/Shunts: No atrial level shunt detected by color flow Doppler. Additional Comments: Vigorous LV systolic function; proximal septal thickening with systolic anterior motion of mitral valve with LVOT gradient of 3.5 m/s; suggest cardiac MRI to R/O HCM.  LEFT VENTRICLE PLAX 2D LVIDd:         3.10 cm   Diastology LVIDs:         1.95 cm   LV e' medial:    5.55 cm/s LV PW:         0.80 cm   LV E/e' medial:  10.4 LV IVS:        1.00 cm   LV e' lateral:   10.20 cm/s LVOT diam:     2.10 cm   LV E/e' lateral: 5.7 LV SV:         112 LV SV Index:   71 LVOT Area:     3.46 cm  RIGHT VENTRICLE RV S prime:     17.50 cm/s TAPSE (M-mode): 2.0 cm LEFT ATRIUM             Index        RIGHT ATRIUM           Index LA diam:        3.00 cm 1.89 cm/m   RA Area:     13.30 cm LA Vol (A2C):   36.4 ml 22.96 ml/m  RA Volume:   29.50 ml  18.61 ml/m LA Vol (A4C):   33.3 ml 21.01 ml/m LA Biplane Vol: 34.9 ml 22.02 ml/m  AORTIC VALVE AV Area (Vmax): 3.67 cm AV Vmax:        186.00 cm/s AV Peak Grad:   13.8 mmHg LVOT Vmax:      197.00 cm/s LVOT Vmean:     159.000 cm/s LVOT VTI:       0.324 m  AORTA Ao Root diam: 3.10 cm Ao Asc diam:  3.50 cm MITRAL VALVE                TRICUSPID VALVE MV Area (PHT): 5.46 cm     TR Peak grad:   19.5 mmHg MV Decel Time: 139 msec     TR Vmax:        221.00 cm/s MV E velocity: 57.80 cm/s MV A velocity: 118.00 cm/s  SHUNTS MV E/A ratio:  0.49         Systemic VTI:  0.32 m                             Systemic Diam: 2.10 cm Olga Millers MD Electronically  signed by Olga Millers MD Signature Date/Time: 01/15/2022/8:27:30 AM    Final     Scheduled Meds:  ascorbic acid  500 mg Oral BID   buprenorphine  4 patch Transdermal Weekly   Chlorhexidine Gluconate Cloth  6 each Topical Daily   docusate sodium  100 mg Oral BID   enoxaparin (LOVENOX) injection  40 mg Subcutaneous Q24H   feeding supplement  237 mL Oral TID BM   furosemide  40 mg Intravenous Daily   lipase/protease/amylase  24,000 Units Oral TID WC   multivitamin with minerals  1 tablet Oral Daily   nortriptyline  30 mg Oral QHS   pantoprazole  40 mg Oral Daily   phosphorus  500 mg Oral TID   silver sulfADIAZINE   Topical BID   sodium chloride flush  10-40 mL Intracatheter Q12H   [START ON 01/16/2022] vitamin A  50,000 Units Oral Daily   Vitamin D (Ergocalciferol)  50,000 Units Oral Q7 days   zinc sulfate  220 mg Oral Daily   Continuous Infusions:  albumin human 25 g (01/15/22 1101)    ceFAZolin (ANCEF) IV 1 g (01/15/22 0121)   potassium chloride       LOS: 8 days   Shelly Coss, MD [facility] P11/05/2021, 11:42 AM

## 2022-01-15 NOTE — TOC Progression Note (Signed)
Transition of Care [facility]) - Progression Note    Patient Details  Name: Carolyn [name] MRN: [id number] Date of Birth: 11-Feb-1956  Transition of Care [facility]) CM/SW Contact  Leeroy Cha, RN Phone Number: 01/15/2022, 2:57 PM  Clinical Narrative:    Tct-husband -don-names of [facility] given with addresses and phone number .  He would like to check out these facilities before making a decision.  He is aware that insurance auth still needs to be done and we will need the name of the facility.        Expected Discharge Plan and Services                                                 Social Determinants of Health (SDOH) Interventions    Readmission Risk Interventions   No data to display

## 2022-01-16 ENCOUNTER — Inpatient Hospital Stay (HOSPITAL_COMMUNITY): Payer: Medicare PPO

## 2022-01-16 DIAGNOSIS — E43 Unspecified severe protein-calorie malnutrition: Secondary | ICD-10-CM | POA: Diagnosis not present

## 2022-01-16 LAB — MAGNESIUM: Magnesium: 1.5 mg/dL — ABNORMAL LOW (ref 1.7–2.4)

## 2022-01-16 LAB — BASIC METABOLIC PANEL
Anion gap: 6 (ref 5–15)
BUN: 10 mg/dL (ref 8–23)
CO2: 31 mmol/L (ref 22–32)
Calcium: 7.4 mg/dL — ABNORMAL LOW (ref 8.9–10.3)
Chloride: 97 mmol/L — ABNORMAL LOW (ref 98–111)
Creatinine, Ser: 0.53 mg/dL (ref 0.44–1.00)
GFR, Estimated: >60 mL/min (ref >60)
Glucose, Bld: 108 mg/dL — ABNORMAL HIGH (ref 70–99)
Potassium: 3.7 mmol/L (ref 3.5–5.1)
Sodium: 134 mmol/L — ABNORMAL LOW (ref 135–145)

## 2022-01-16 LAB — BODY FLUID CULTURE W GRAM STAIN: Culture: NO GROWTH

## 2022-01-16 LAB — PHOSPHORUS: Phosphorus: 2.9 mg/dL (ref 2.5–4.6)

## 2022-01-16 MED ORDER — ALBUMIN HUMAN 25 % IV SOLN
25.0000 g | Freq: Every day | INTRAVENOUS | Status: DC
  Administered 2022-01-17 – 2022-01-18 (×2): 25 g via INTRAVENOUS
  Filled 2022-01-16 (×2): qty 100

## 2022-01-16 MED ORDER — ALBUMIN HUMAN 25 % IV SOLN: 25.0000 g | Freq: Every day | INTRAVENOUS | Status: DC

## 2022-01-16 MED ORDER — TIZANIDINE HCL 4 MG PO TABS
2.0000 mg | ORAL_TABLET | Freq: Two times a day (BID) | ORAL | Status: AC | PRN
  Administered 2022-01-16: 2 mg via ORAL
  Filled 2022-01-16: qty 1

## 2022-01-16 MED ORDER — FUROSEMIDE 10 MG/ML IJ SOLN
40.0000 mg | Freq: Two times a day (BID) | INTRAMUSCULAR | Status: DC
  Administered 2022-01-16 – 2022-01-18 (×4): 40 mg via INTRAVENOUS
  Filled 2022-01-16 (×4): qty 4

## 2022-01-16 MED ORDER — MAGNESIUM SULFATE 2 GM/50ML IV SOLN
2.0000 g | Freq: Once | INTRAVENOUS | Status: AC
  Administered 2022-01-16: 2 g via INTRAVENOUS
  Filled 2022-01-16: qty 50

## 2022-01-16 MED ORDER — FUROSEMIDE 10 MG/ML IJ SOLN
40.0000 mg | Freq: Two times a day (BID) | INTRAMUSCULAR | Status: DC
  Administered 2022-01-16: 40 mg via INTRAVENOUS

## 2022-01-16 MED ORDER — MAGNESIUM OXIDE -MG SUPPLEMENT 400 (240 MG) MG PO TABS
800.0000 mg | ORAL_TABLET | Freq: Every day | ORAL | Status: DC
  Administered 2022-01-17: 800 mg via ORAL
  Filled 2022-01-16: qty 2

## 2022-01-16 MED ORDER — BUPRENORPHINE 10 MCG/HR TD PTWK
2.0000 | MEDICATED_PATCH | TRANSDERMAL | Status: DC
  Administered 2022-01-16 – 2022-01-23 (×2): 2 via TRANSDERMAL
  Filled 2022-01-16 (×2): qty 2

## 2022-01-16 NOTE — Progress Notes (Signed)
PROGRESS NOTE  Carolyn [name]  ZOX:[id number] DOB: [birth year] DOA: 01/06/2022 PCP: Farris Has, MD   Brief Narrative:  Patient is a 66 year old female with history of chronic alcohol pancreatitis, chronic pain syndrome, GERD, severe malnutrition, osteoporosis who presented initially with right foot wound that he started as a blister/bulla.  Right foot x-ray raised concern for acute osteomyelitis but was asked to repeat MRI, started on antibiotics.  Evaluated by podiatry who recommended antibiotics for 7 days, wound care and outpatient follow-up.  Patient developed abdominal fullness for which KUB was done which showed a pleural effusion.  Chest x-ray confirmed large right pleural effusion.  IR did thoracentesis and was given IV Lasix and albumin.  Palliative care consulted for goals of care who recommend outpatient follow-up at a skilled nursing facility.  PT/OT recommending SNF on discharge.  Medically stable for discharge whenever possible.We are planning continue IV Lasix because of persistent volume overload   Assessment & Plan:  Principal Problem:   Protein-calorie malnutrition, severe (HCC) Active Problems:   Chronic pancreatitis (HCC)   Osteomyelitis (HCC)   Hypokalemia   Hypomagnesemia   AKI (acute kidney injury) (HCC)   Chronic pain syndrome   Cellulitis of right foot   Normocytic anemia   Gastroesophageal reflux disease without esophagitis   Pressure injury of skin   Physical deconditioning   Pleural effusion   Abdominal distention   Acute respiratory failure with hypoxia (HCC)   Hypoalbuminemia   Anasarca   Acute hypoxic respiratory failure: Secondary to right pleural effusion, atelectasis.  Chest x-ray showed large pleural effusion.  BNP was also elevated.  No history of CHF.  Albumin was very low.  Status post thoracentesis with removal of 1.2 L of fluid.  Being given IV Lasix and IV albumin because she is tolerating with preserved kidney function and improvement in the  respiratory status. .  Continue incentive spirometer, will continue to try to wean the oxygen.  On 2 L.Echocardiogram showed EF of 70 to 75%, grade 1 diastolic dysfunction. CXR on 11/4 showed increasing moderate right pleural effusion, will order for thoracentesis again  Anasarca/hypoalbuminemia: Has dependent edema.  Likely nutritional.  Nutritionist consulted  Bilateral feet cellulitis/ulceration: X-ray of right foot raise concern for cellulitis but osteomyelitis ruled out by MRI.  No leukocytosis.  No plan for surgery, podiatry recommended 7 days course of antibiotics, wound care.  Ancef to be completed on 10/4.  Continue pain management, supportive care  AKI: Resolved  Abdominal distention: KUB raised concern for ileus.  Now having small bowel movements, passing gas.  Pain medication discontinued.  Continue home Butrans.  Continue bowel regimen  Electrolytes abnormalities: Continue to monitor and supplement as needed.  Supplemented with phosphorus, magnesium,K.  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  Continue home Butrans, fentanyl as needed, Creon  Lactacidosis: Likely type B.  Continue monitoring  Normocytic anemia: Currently hemoglobin in the range of 8, also developed mild thrombocytopenia.  We will continue to monitor.  Anemia panel suggests anemia of chronic disease.  Mild sinus tachycardia: Monitor on telemetry  GERD: Continue PPI  Vitamin D/A insufficiency: Being supplemented  Physical deconditioning/ambulatory dysfunction: PT/OT recommended SNF  Severe protein calorie malnutrition: nutrition is consulted.  BMI of 16.7.  This is likely associated with chronic pancreatitis, multiple abdominal surgeries  Goals of care: Palliative care also consulted here.  Recommend outpatient follow-up at the SNF  Pressure Injury 01/07/22 Coccyx Mid Stage 1 -  Intact skin with non-blanchable  redness of a localized area usually over a bony prominence. (Active)  01/07/22 1848  Location: Coccyx  Location Orientation: Mid  Staging: Stage 1 -  Intact skin with non-blanchable redness of a localized area usually over a bony prominence.  Wound Description (Comments):   Present on Admission: Yes            Nutrition Problem: Severe Malnutrition Etiology: chronic illness (chronic pancreatitis, h/o multiple gastric surgeries including gastric bypass) Pressure Injury 01/07/22 Coccyx Mid Stage 1 -  Intact skin with non-blanchable redness of a localized area usually over a bony prominence. (Active)  01/07/22 1848  Location: Coccyx  Location Orientation: Mid  Staging: Stage 1 -  Intact skin with non-blanchable redness of a localized area usually over a bony prominence.  Wound Description (Comments):   Present on Admission: Yes  Dressing Type Foam - Lift dressing to assess site every shift 01/15/22 2016    DVT prophylaxis:enoxaparin (LOVENOX) injection 40 mg Start: 01/07/22 2000     Code Status: Full Code  Family Communication: Husband on phone on 11/3 Patient status:Inpatient  Patient is from :Home  Anticipated discharge to:SNF  Estimated DC date:waiting for bed   Consultants: Palliative care  Procedures:None  Antimicrobials:  Anti-infectives (From admission, onward)    Start     Dose/Rate Route Frequency Ordered Stop   01/09/22 1800  ceFAZolin (ANCEF) IVPB 1 g/50 mL premix        1 g 100 mL/hr over 30 Minutes Intravenous Every 8 hours 01/09/22 1513 01/16/22 1759   01/06/22 2030  cefTRIAXone (ROCEPHIN) 2 g in sodium chloride 0.9 % 100 mL IVPB  Status:  Discontinued       See Hyperspace for full Linked Orders Report.   2 g 200 mL/hr over 30 Minutes Intravenous Every 24 hours 01/06/22 2017 01/09/22 1513   01/06/22 2030  metroNIDAZOLE (FLAGYL) IVPB 500 mg  Status:  Discontinued       See Hyperspace for full Linked Orders Report.   500 mg 100 mL/hr over 60  Minutes Intravenous Every 12 hours 01/06/22 2017 01/09/22 1513       Subjective: Patient seen and examined the bedside today.  She continues to have some cough.  Remains in sinus tachycardia.  Looks more overloaded today.  Requiring 2 L of oxygen. Objective: Vitals:   01/16/22 0734 01/16/22 0740 01/16/22 0923 01/16/22 1132  BP: 135/85  116/86 110/71  Pulse: (!) 115  (!) 123 (!) 106  Resp: 15  20 16   Temp: 98.9 F (37.2 C)  98.5 F (36.9 C) 98.8 F (37.1 C)  TempSrc: Oral  Oral Oral  SpO2: 90% 99% 96% 98%  Weight:      Height:        Intake/Output Summary (Last 24 hours) at 01/16/2022 1340 Last data filed at 01/16/2022 1136 Gross per 24 hour  Intake 512.57 ml  Output 1150 ml  Net -637.43 ml   Filed Weights   01/06/22 1857  Weight: 49.9 kg    Examination:   General exam: Very weak, deconditioned, cachectic HEENT: PERRL Respiratory system: Diminised air entry on right side Cardiovascular system: S1 & S2 heard, RRR.  Gastrointestinal system: Abdomen is nondistended, soft and nontender. Central nervous system: Alert and oriented Extremities: Bilateral Lower  extremity edema, no clubbing ,no cyanosis, and the right foot Skin: Multiple skin breakdown,pressure  ulcers   Data Reviewed: I have personally reviewed following labs and imaging studies  CBC: Recent Labs  Lab  01/10/22 0401 01/11/22 0840 01/12/22 0427 01/13/22 0257  WBC 11.5* 8.9 11.9* 6.1  HGB 9.8* 8.5* 10.8* 8.0*  HCT 29.3* 25.2* 32.7* 24.6*  MCV 95.4 94.7 95.3 96.5  PLT 132* 108* 129* 94*   Basic Metabolic Panel: Recent Labs  Lab 01/12/22 0427 01/13/22 0257 01/14/22 0238 01/15/22 0450 01/16/22 0410  NA 131* 131* 133* 135 134*  K 4.3 3.9 3.4* 3.4* 3.7  CL 102 101 99 98 97*  CO2 23 26 28 31 31   GLUCOSE 130* 103* 134* 167* 108*  BUN 8 8 9 9 10   CREATININE 0.75 0.63 0.66 0.57 0.53  CALCIUM 7.3* 7.0* 7.4* 7.0* 7.4*  MG 2.0 1.4* 1.6* 1.8 1.5*  PHOS 1.8* 1.7* 1.9* 2.5 2.9     Recent  Results (from the past 240 hour(s))  Culture, blood (routine x 2)     Status: None   Collection Time: 01/06/22  8:25 PM   Specimen: BLOOD  Result Value Ref Range Status   Specimen Description   Final    BLOOD RIGHT ANTECUBITAL Performed at [facility], [address] [place], [address] [place]    Special Requests   Final    BOTTLES DRAWN AEROBIC AND ANAEROBIC Blood Culture adequate volume Performed at [facility], [address] [place], [address] [place]    Culture   Final    NO GROWTH 5 DAYS Performed at [facility], [address] [place], [address] [place]    Report Status 01/11/2022 FINAL  Final  Culture, blood (routine x 2)     Status: None   Collection Time: 01/07/22  6:51 PM   Specimen: BLOOD  Result Value Ref Range Status   Specimen Description   Final    BLOOD SITE NOT SPECIFIED Performed at [facility], [address], [place], [place]    Special Requests   Final    BOTTLES DRAWN AEROBIC ONLY Blood Culture adequate volume Performed at [facility], [address] [place], [place]    Culture   Final    NO GROWTH 5 DAYS Performed at [facility], [address] [place], [address] [place]    Report Status 01/12/2022 FINAL  Final  Culture, blood (Routine X 2) w Reflex to ID Panel     Status: None   Collection Time: 01/07/22  6:51 PM   Specimen: BLOOD  Result Value Ref Range Status   Specimen Description   Final    BLOOD SITE NOT SPECIFIED Performed at [facility], [address], [place], [place]    Special Requests   Final    BOTTLES DRAWN AEROBIC ONLY Blood Culture adequate volume Performed at [facility], [address], [place], [place]    Culture   Final    NO GROWTH 5 DAYS Performed at [facility], [address] [place], [address] [place]    Report Status 01/12/2022 FINAL  Final  Body  fluid culture w Gram Stain     Status: None   Collection Time: 01/12/22 12:30 PM   Specimen: Peritoneal Washings  Result Value Ref Range Status   Specimen Description   Final    PERITONEAL Performed at [facility], [address], [place], [place]    Special Requests   Final    NONE Performed at [facility], [address] [place],   [id number]    Gram Stain   Final    RARE WBC PRESENT,BOTH PMN AND MONONUCLEAR NO ORGANISMS SEEN    Culture   Final    NO GROWTH 3 DAYS Performed at [facility], [address] [place], [place] [id number]    Report Status 01/16/2022 FINAL  Final     Radiology Studies: DG CHEST PORT 1 VIEW  Result Date: 01/16/2022 CLINICAL DATA:  Shortness of breath. EXAM: PORTABLE CHEST 1 VIEW COMPARISON:  Radiograph 01/12/2022. FINDINGS: Right upper extremity PICC tip at the atrial caval junction. Increasing right pleural effusion at least moderate in size. Hazy opacity at the right lung base likely represents associated atelectasis. There is a small left pleural effusion. Improving left upper lobe airspace disease from prior exam. There are streaky right upper lobe opacities. No pneumothorax. The heart is upper normal in size. Stable mediastinal contours. IMPRESSION: 1. Increasing right pleural effusion at least moderate in size. Hazy opacity at the right lung base likely represents associated atelectasis. 2. Small left pleural effusion. 3. Improving left upper lobe airspace disease. Streaky right upper lobe opacities may be atelectasis or pneumonia. Electronically Signed   By: Narda RutherfordMelanie  [place] M.D.   On: 01/16/2022 12:18   ECHOCARDIOGRAM COMPLETE  Result Date: 01/15/2022    ECHOCARDIOGRAM REPORT   Patient Name:   Carolyn BrownieJANE C Sar Date of Exam: 01/15/2022 Medical Rec #:  [id number]      Height:       68.0 in Accession #:    [id number]     Weight:       110.0 lb Date of Birth:  09-12-1955      BSA:          1.585 m  Patient Age:    66 years       BP:           99/57 mmHg Patient Gender: F              HR:           94 bpm. Exam Location:  Inpatient Procedure: 2D Echo, Color Doppler and Cardiac Doppler Indications:    Other Abnormalities of the Heart R00.8  History:        Patient has no prior history of Echocardiogram examinations.                 Risk Factors:ETOH.  Sonographer:    Irving BurtonEmily Senior RDCS Referring Phys: [id number] Boyce MediciAYE T GONFA IMPRESSIONS  1. Vigorous LV systolic function; proximal septal thickening with systolic anterior motion of mitral valve with LVOT gradient of 3.5 m/s; suggest cardiac MRI to R/O HCM.  2. Left ventricular ejection fraction, by estimation, is 70 to 75%. The left ventricle has hyperdynamic function. The left ventricle has no regional wall motion abnormalities. There is mild left ventricular hypertrophy of the basal-septal segment. Left ventricular diastolic parameters are consistent with Grade I diastolic dysfunction (impaired relaxation).  3. Right ventricular systolic function is normal. The right ventricular size is normal. There is normal pulmonary artery systolic pressure.  4. The mitral valve is normal in structure. Trivial mitral valve regurgitation. No evidence of mitral stenosis.  5. The aortic valve is tricuspid. Aortic valve regurgitation is trivial. Aortic valve sclerosis is present, with no evidence of aortic valve stenosis.  6. The inferior vena cava is normal in size with greater than 50% respiratory variability, suggesting right atrial pressure of 3 mmHg. Comparison(s): No prior Echocardiogram. FINDINGS  Left Ventricle: Left ventricular  ejection fraction, by estimation, is 70 to 75%. The left ventricle has hyperdynamic function. The left ventricle has no regional wall motion abnormalities. The left ventricular internal cavity size was normal in size. There is mild left ventricular hypertrophy of the basal-septal segment. Left ventricular diastolic parameters are consistent with  Grade I diastolic dysfunction (impaired relaxation). Right Ventricle: The right ventricular size is normal. Right ventricular systolic function is normal. There is normal pulmonary artery systolic pressure. The tricuspid regurgitant velocity is 2.21 m/s, and with an assumed right atrial pressure of 3 mmHg,  the estimated right ventricular systolic pressure is 22.5 mmHg. Left Atrium: Left atrial size was normal in size. Right Atrium: Right atrial size was normal in size. Pericardium: There is no evidence of pericardial effusion. Mitral Valve: The mitral valve is normal in structure. Mild mitral annular calcification. Trivial mitral valve regurgitation. No evidence of mitral valve stenosis. Tricuspid Valve: The tricuspid valve is normal in structure. Tricuspid valve regurgitation is trivial. No evidence of tricuspid stenosis. Aortic Valve: The aortic valve is tricuspid. Aortic valve regurgitation is trivial. Aortic valve sclerosis is present, with no evidence of aortic valve stenosis. Aortic valve peak gradient measures 13.8 mmHg. Pulmonic Valve: The pulmonic valve was normal in structure. Pulmonic valve regurgitation is trivial. No evidence of pulmonic stenosis. Aorta: The aortic root is normal in size and structure. Venous: The inferior vena cava is normal in size with greater than 50% respiratory variability, suggesting right atrial pressure of 3 mmHg. IAS/Shunts: No atrial level shunt detected by color flow Doppler. Additional Comments: Vigorous LV systolic function; proximal septal thickening with systolic anterior motion of mitral valve with LVOT gradient of 3.5 m/s; suggest cardiac MRI to R/O HCM.  LEFT VENTRICLE PLAX 2D LVIDd:         3.10 cm   Diastology LVIDs:         1.95 cm   LV e' medial:    5.55 cm/s LV PW:         0.80 cm   LV E/e' medial:  10.4 LV IVS:        1.00 cm   LV e' lateral:   10.20 cm/s LVOT diam:     2.10 cm   LV E/e' lateral: 5.7 LV SV:         112 LV SV Index:   71 LVOT Area:     3.46 cm   RIGHT VENTRICLE RV S prime:     17.50 cm/s TAPSE (M-mode): 2.0 cm LEFT ATRIUM             Index        RIGHT ATRIUM           Index LA diam:        3.00 cm 1.89 cm/m   RA Area:     13.30 cm LA Vol (A2C):   36.4 ml 22.96 ml/m  RA Volume:   29.50 ml  18.61 ml/m LA Vol (A4C):   33.3 ml 21.01 ml/m LA Biplane Vol: 34.9 ml 22.02 ml/m  AORTIC VALVE AV Area (Vmax): 3.67 cm AV Vmax:        186.00 cm/s AV Peak Grad:   13.8 mmHg LVOT Vmax:      197.00 cm/s LVOT Vmean:     159.000 cm/s LVOT VTI:       0.324 m  AORTA Ao Root diam: 3.10 cm Ao Asc diam:  3.50 cm MITRAL VALVE  TRICUSPID VALVE MV Area (PHT): 5.46 cm     TR Peak grad:   19.5 mmHg MV Decel Time: 139 msec     TR Vmax:        221.00 cm/s MV E velocity: 57.80 cm/s MV A velocity: 118.00 cm/s  SHUNTS MV E/A ratio:  0.49         Systemic VTI:  0.32 m                             Systemic Diam: 2.10 cm Kirk Ruths MD Electronically signed by Kirk Ruths MD Signature Date/Time: 01/15/2022/8:27:30 AM    Final     Scheduled Meds:  ascorbic acid  500 mg Oral BID   buprenorphine  2 patch Transdermal Weekly   Chlorhexidine Gluconate Cloth  6 each Topical Daily   docusate sodium  100 mg Oral BID   enoxaparin (LOVENOX) injection  40 mg Subcutaneous Q24H   feeding supplement  237 mL Oral TID BM   furosemide  40 mg Intravenous BID   lipase/protease/amylase  24,000 Units Oral TID WC   [START ON 01/17/2022] magnesium oxide  800 mg Oral Daily   multivitamin with minerals  1 tablet Oral Daily   nortriptyline  30 mg Oral QHS   pantoprazole  40 mg Oral Daily   phosphorus  500 mg Oral TID   silver sulfADIAZINE   Topical BID   sodium chloride flush  10-40 mL Intracatheter Q12H   vitamin A  50,000 Units Oral Daily   Vitamin D (Ergocalciferol)  50,000 Units Oral Q7 days   zinc sulfate  220 mg Oral Daily   Continuous Infusions:  [START ON 01/17/2022] albumin human      ceFAZolin (ANCEF) IV 1 g (01/16/22 1124)     LOS: 9 days   Shelly Coss,  MD [facility] P11/06/2021, 1:40 PM

## 2022-01-16 NOTE — Progress Notes (Signed)
PT Cancellation Note  Patient Details Name: Carolyn [name] MRN: [id number] DOB: [birth year]   Cancelled Treatment:    Reason Eval/Treat Not Completed: Other (comment) (RN request hold-pt with elevated HR, requiring supplemental O2 today, per EMR potential plan for thoracentesis again. PT will follow up as schedule allows.)   Festus Barren., PT, DPT  Acute Rehabilitation Services  Office [phone]  01/16/2022, 3:22 PM

## 2022-01-16 NOTE — Progress Notes (Signed)
   01/16/22 0734  Assess: MEWS Score  Temp 98.9 F (37.2 C)  BP 135/85  MAP (mmHg) 99  Pulse Rate (!) 115  Resp 15  Level of Consciousness Alert  SpO2 90 %  O2 Device Room Air  Assess: MEWS Score  MEWS Temp 0  MEWS Systolic 0  MEWS Pulse 2  MEWS RR 0  MEWS LOC 0  MEWS Score 2  MEWS Score Color Yellow  Assess: if the MEWS score is Yellow or Red  Were vital signs taken at a resting state? Yes  Focused Assessment Change from prior assessment (see assessment flowsheet)  Does the patient meet 2 or more of the SIRS criteria? Yes  Does the patient have a confirmed or suspected source of infection? Yes  Provider and Rapid Response Notified?  (MD notified)  MEWS guidelines implemented *See Row Information* Yes  Treat  MEWS Interventions Escalated (See documentation below)  Pain Scale 0-10  Pain Score 6  Pain Type Acute pain  Pain Location Leg  Pain Orientation Right;Lower  Pain Radiating Towards foot  Pain Descriptors / Indicators Aching  Pain Frequency Constant  Pain Onset Progressive  Patients Stated Pain Goal 3  Pain Intervention(s) Medication (See eMAR)  Take Vital Signs  Increase Vital Sign Frequency  Yellow: Q 2hr X 2 then Q 4hr X 2, if remains yellow, continue Q 4hrs  Escalate  MEWS: Escalate Yellow: discuss with charge nurse/RN and consider discussing with provider and RRT  Notify: Charge Nurse/RN  Name of Charge Nurse/RN Notified Rise Paganini, RN  Date Charge Nurse/RN Notified 01/16/22  Time Charge Nurse/RN Notified [id number]  Notify: Provider  Provider Name/Title Shelly Coss, MD  Date Provider Notified 01/16/22  Time Provider Notified [phone]  Method of Notification  (secure chat)  Notification Reason Change in status  Assess: SIRS CRITERIA  SIRS Temperature  0  SIRS Pulse 1  SIRS Respirations  0  SIRS WBC 1  SIRS Score Sum  2   RN witnessed pt cough up moderate amount of blood.  Patient reports having bit lip during sleep last night, and says, "I think I  must have swallowed some blood."  Currently patient has mild cough, 6/10 pain in lower leg, but denies any other discomfort or difficulty breathing.  Angie Fava, RN

## 2022-01-17 ENCOUNTER — Inpatient Hospital Stay (HOSPITAL_COMMUNITY): Payer: Medicare PPO

## 2022-01-17 DIAGNOSIS — E43 Unspecified severe protein-calorie malnutrition: Secondary | ICD-10-CM | POA: Diagnosis not present

## 2022-01-17 LAB — CBC
HCT: 20 % — ABNORMAL LOW (ref 36.0–46.0)
Hemoglobin: 6.6 g/dL — CL (ref 12.0–15.0)
MCH: 31.4 pg (ref 26.0–34.0)
MCHC: 33 g/dL (ref 30.0–36.0)
MCV: 95.2 fL (ref 80.0–100.0)
Platelets: 106 10*3/uL — ABNORMAL LOW (ref 150–400)
RBC: 2.1 MIL/uL — ABNORMAL LOW (ref 3.87–5.11)
RDW: 16.4 % — ABNORMAL HIGH (ref 11.5–15.5)
WBC: 3.7 10*3/uL — ABNORMAL LOW (ref 4.0–10.5)
nRBC: 0 % (ref 0.0–0.2)

## 2022-01-17 LAB — BASIC METABOLIC PANEL
Anion gap: 7 (ref 5–15)
BUN: 10 mg/dL (ref 8–23)
CO2: 33 mmol/L — ABNORMAL HIGH (ref 22–32)
Calcium: 7.4 mg/dL — ABNORMAL LOW (ref 8.9–10.3)
Chloride: 96 mmol/L — ABNORMAL LOW (ref 98–111)
Creatinine, Ser: 0.59 mg/dL (ref 0.44–1.00)
GFR, Estimated: >60 mL/min (ref >60)
Glucose, Bld: 106 mg/dL — ABNORMAL HIGH (ref 70–99)
Potassium: 3.2 mmol/L — ABNORMAL LOW (ref 3.5–5.1)
Sodium: 136 mmol/L (ref 135–145)

## 2022-01-17 LAB — IRON AND TIBC: Iron: 35 ug/dL (ref 28–170)

## 2022-01-17 LAB — HEPATIC FUNCTION PANEL
ALT: 8 U/L (ref 0–44)
AST: 31 U/L (ref 15–41)
Albumin: 2.8 g/dL — ABNORMAL LOW (ref 3.5–5.0)
Alkaline Phosphatase: 126 U/L (ref 38–126)
Bilirubin, Direct: 0.3 mg/dL — ABNORMAL HIGH (ref 0.0–0.2)
Indirect Bilirubin: 0.5 mg/dL (ref 0.3–0.9)
Total Bilirubin: 0.8 mg/dL (ref 0.3–1.2)
Total Protein: 4.7 g/dL — ABNORMAL LOW (ref 6.5–8.1)

## 2022-01-17 LAB — OCCULT BLOOD X 1 CARD TO LAB, STOOL: Fecal Occult Bld: POSITIVE — AB

## 2022-01-17 LAB — PREPARE RBC (CROSSMATCH)

## 2022-01-17 MED ORDER — PANTOPRAZOLE SODIUM 40 MG PO TBEC
40.0000 mg | DELAYED_RELEASE_TABLET | Freq: Two times a day (BID) | ORAL | Status: DC
  Administered 2022-01-17 – 2022-01-25 (×17): 40 mg via ORAL
  Filled 2022-01-17 (×17): qty 1

## 2022-01-17 MED ORDER — TIZANIDINE HCL 4 MG PO TABS
2.0000 mg | ORAL_TABLET | Freq: Two times a day (BID) | ORAL | Status: DC | PRN
  Administered 2022-01-17 – 2022-01-25 (×13): 2 mg via ORAL
  Filled 2022-01-17 (×14): qty 1

## 2022-01-17 MED ORDER — LIDOCAINE HCL 1 % IJ SOLN
INTRAMUSCULAR | Status: AC
  Administered 2022-01-17: 10 mL
  Filled 2022-01-17: qty 20

## 2022-01-17 MED ORDER — SODIUM CHLORIDE 0.9% IV SOLUTION: Freq: Once | INTRAVENOUS | Status: AC

## 2022-01-17 MED ORDER — POTASSIUM CHLORIDE 20 MEQ PO PACK
40.0000 meq | PACK | Freq: Once | ORAL | Status: AC
  Administered 2022-01-17: 40 meq via ORAL
  Filled 2022-01-17: qty 2

## 2022-01-17 NOTE — Progress Notes (Signed)
Lab called, pt's Hgb is 6.6. most recently Bp is 109/61 (75), HR 89, Resp 20. Pt denied dizziness. MD notified.

## 2022-01-17 NOTE — Progress Notes (Signed)
PROGRESS NOTE  Carolyn [name]  IRJ:[id number] DOB: [birth year] DOA: 01/06/2022 PCP: Farris Has, MD   Brief Narrative:  Patient is a 66 year old female with history of chronic alcohol pancreatitis, chronic pain syndrome, GERD, severe malnutrition, osteoporosis who presented initially with right foot wound that he started as a blister/bulla.  Right foot x-ray raised concern for acute osteomyelitis but was asked to repeat MRI, started on antibiotics.  Evaluated by podiatry who recommended antibiotics for 7 days, wound care and outpatient follow-up.  Patient developed abdominal fullness for which KUB was done which showed a pleural effusion.  Chest x-ray confirmed large right pleural effusion.  IR did thoracentesis and was given IV Lasix and albumin.  Palliative care consulted for goals of care who recommend outpatient follow-up at a skilled nursing facility.  PT/OT recommending SNF on discharge.  Medically stable for discharge whenever possible.We are planning continue IV Lasix until she is discharged because of persistent volume overload   Assessment & Plan:  Principal Problem:   Protein-calorie malnutrition, severe (HCC) Active Problems:   Chronic pancreatitis (HCC)   Osteomyelitis (HCC)   Hypokalemia   Hypomagnesemia   AKI (acute kidney injury) (HCC)   Chronic pain syndrome   Cellulitis of right foot   Normocytic anemia   Gastroesophageal reflux disease without esophagitis   Pressure injury of skin   Physical deconditioning   Pleural effusion   Abdominal distention   Acute respiratory failure with hypoxia (HCC)   Hypoalbuminemia   Anasarca   Acute hypoxic respiratory failure: Secondary to right pleural effusion, atelectasis.  Chest x-ray showed large pleural effusion.  BNP was also elevated.  No history of CHF.  Albumin was very low.  Status post thoracentesis with removal of 1.2 L of fluid.  Being given IV Lasix and IV albumin because she is tolerating with preserved kidney  function and improvement in the respiratory status.   Continue incentive spirometer, will continue to try to wean the oxygen.  On 2 L.Echocardiogram showed EF of 70 to 75%, grade 1 diastolic dysfunction. CXR on 11/4 showed increasing moderate right pleural effusion, underwent Thora again with removal of 800 mL of fluid.  She remains on room air today  Normocytic anemia: Hemoglobin dropped to the range of 6 today from 8.  No signs of acute blood loss.  She denies any change in the color of the stool.  We will also checking FOBT.  She will be transfused with a unit of PRBC today.  We will continue to monitor.  Anemia panel done on 10/29 suggests anemia of chronic disease.  Anasarca/hypoalbuminemia: Has dependent edema.  Likely nutritional.  Nutritionist consulted and following  Bilateral feet cellulitis/ulceration: X-ray of right foot raise concern for cellulitis but osteomyelitis ruled out by MRI.  No leukocytosis.  No plan for surgery, podiatry recommended 7 days course of antibiotics, wound care.  She  completed ancef on 10/4.  Continue pain management, supportive care  AKI: Resolved  Abdominal distention: KUB raised concern for ileus.  Now having small bowel movements, passing gas.  Pain medication discontinued.  Continue home Butrans.  Continue bowel regimen  Electrolytes abnormalities: Continue to monitor and supplement as needed.  Supplemented with phosphorus, magnesium,K.  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  Continue home Butrans, fentanyl as needed, Creon  Mild sinus tachycardia: Monitor on telemetry  GERD: Continue PPI  Vitamin D/A insufficiency: Being supplemented  Physical deconditioning/ambulatory dysfunction: PT/OT recommended SNF  Severe  protein calorie malnutrition: nutrition is consulted.  BMI of 16.7.  This is likely associated with chronic pancreatitis, multiple abdominal  surgeries  Goals of care: Palliative care also consulted here.  Recommend outpatient follow-up at the SNF  Pressure Injury 01/07/22 Coccyx Mid Stage 1 -  Intact skin with non-blanchable redness of a localized area usually over a bony prominence. (Active)  01/07/22 1848  Location: Coccyx  Location Orientation: Mid  Staging: Stage 1 -  Intact skin with non-blanchable redness of a localized area usually over a bony prominence.  Wound Description (Comments):   Present on Admission: Yes            Nutrition Problem: Severe Malnutrition Etiology: chronic illness (chronic pancreatitis, h/o multiple gastric surgeries including gastric bypass) Pressure Injury 01/07/22 Coccyx Mid Stage 1 -  Intact skin with non-blanchable redness of a localized area usually over a bony prominence. (Active)  01/07/22 1848  Location: Coccyx  Location Orientation: Mid  Staging: Stage 1 -  Intact skin with non-blanchable redness of a localized area usually over a bony prominence.  Wound Description (Comments):   Present on Admission: Yes  Dressing Type Foam - Lift dressing to assess site every shift 01/17/22 0177    DVT prophylaxis:     Code Status: Full Code  Family Communication: Husband on phone on 11/3 Patient status:Inpatient  Patient is from :Home  Anticipated discharge to:SNF  Estimated DC date:waiting for bed   Consultants: Palliative care  Procedures:None  Antimicrobials:  Anti-infectives (From admission, onward)    Start     Dose/Rate Route Frequency Ordered Stop   01/09/22 1800  ceFAZolin (ANCEF) IVPB 1 g/50 mL premix        1 g 100 mL/hr over 30 Minutes Intravenous Every 8 hours 01/09/22 1513 01/16/22 1759   01/06/22 2030  cefTRIAXone (ROCEPHIN) 2 g in sodium chloride 0.9 % 100 mL IVPB  Status:  Discontinued       See Hyperspace for full Linked Orders Report.   2 g 200 mL/hr over 30 Minutes Intravenous Every 24 hours 01/06/22 2017 01/09/22 1513   01/06/22 2030   metroNIDAZOLE (FLAGYL) IVPB 500 mg  Status:  Discontinued       See Hyperspace for full Linked Orders Report.   500 mg 100 mL/hr over 60 Minutes Intravenous Every 12 hours 01/06/22 2017 01/09/22 1513       Subjective:  Patient seen and examined at bedside today.  Hemodynamically stable.  On room air.  Still bothered with some cough, waiting for thoracentesis today.  Her hemoglobin dropped to the range of 6.  She denies any abdomen pain though she has some cramps.  She had a bowel movement yesterday which did not look red or black   Objective: Vitals:   01/16/22 2031 01/17/22 0422 01/17/22 0930 01/17/22 0945  BP: 132/87 109/61 112/69 114/71  Pulse: (!) 105 89    Resp: 18 20    Temp: 98.9 F (37.2 C) 98.1 F (36.7 C)    TempSrc: Oral Oral    SpO2: 96% 92%    Weight:      Height:        Intake/Output Summary (Last 24 hours) at 01/17/2022 1038 Last data filed at 01/17/2022 0615 Gross per 24 hour  Intake 267.82 ml  Output 2600 ml  Net -2332.18 ml   Filed Weights   01/06/22 1857  Weight: 49.9 kg    Examination:   General exam: Overall comfortable, not in distress, deconditioned, chronically ill looking,  cachectic HEENT: PERRL Respiratory system: Diminished sounds on the right side Cardiovascular system: S1 & S2 heard, RRR.  Gastrointestinal system: Abdomen is nondistended, soft and nontender. Central nervous system: Alert and oriented Extremities: Bilateral lower extremity edema, no clubbing ,no cyanosis Skin: Skin breakdowns, pressure ulcer as above   Data Reviewed: I have personally reviewed following labs and imaging studies  CBC: Recent Labs  Lab 01/11/22 0840 01/12/22 0427 01/13/22 0257 01/17/22 0259  WBC 8.9 11.9* 6.1 3.7*  HGB 8.5* 10.8* 8.0* 6.6*  HCT 25.2* 32.7* 24.6* 20.0*  MCV 94.7 95.3 96.5 95.2  PLT 108* 129* 94* 106*   Basic Metabolic Panel: Recent Labs  Lab 01/12/22 0427 01/13/22 0257 01/14/22 0238 01/15/22 0450 01/16/22 0410  01/17/22 0259  NA 131* 131* 133* 135 134* 136  K 4.3 3.9 3.4* 3.4* 3.7 3.2*  CL 102 101 99 98 97* 96*  CO2 23 26 28 31 31  33*  GLUCOSE 130* 103* 134* 167* 108* 106*  BUN 8 8 9 9 10 10   CREATININE 0.75 0.63 0.66 0.57 0.53 0.59  CALCIUM 7.3* 7.0* 7.4* 7.0* 7.4* 7.4*  MG 2.0 1.4* 1.6* 1.8 1.5*  --   PHOS 1.8* 1.7* 1.9* 2.5 2.9  --      Recent Results (from the past 240 hour(s))  Culture, blood (routine x 2)     Status: None   Collection Time: 01/07/22  6:51 PM   Specimen: BLOOD  Result Value Ref Range Status   Specimen Description   Final    BLOOD SITE NOT SPECIFIED Performed at [facility], [address] [place], [place]    Special Requests   Final    BOTTLES DRAWN AEROBIC ONLY Blood Culture adequate volume Performed at [facility], [address], [place], [place]    Culture   Final    NO GROWTH 5 DAYS Performed at [facility], [address], [place], [address] [place]    Report Status 01/12/2022 FINAL  Final  Culture, blood (Routine X 2) w Reflex to ID Panel     Status: None   Collection Time: 01/07/22  6:51 PM   Specimen: BLOOD  Result Value Ref Range Status   Specimen Description   Final    BLOOD SITE NOT SPECIFIED Performed at [facility], [address], [place], [place]    Special Requests   Final    BOTTLES DRAWN AEROBIC ONLY Blood Culture adequate volume Performed at [facility], [address] [place], [place]    Culture   Final    NO GROWTH 5 DAYS Performed at [facility], [address], [place], [address] [place]    Report Status 01/12/2022 FINAL  Final  Body fluid culture w Gram Stain     Status: None   Collection Time: 01/12/22 12:30 PM   Specimen: Peritoneal Washings  Result Value Ref Range Status   Specimen Description   Final    PERITONEAL Performed at [facility], [address]  [place], [place]    Special Requests   Final    NONE Performed at [facility], [address], [place], [place]    Gram Stain   Final    RARE WBC PRESENT,BOTH PMN AND MONONUCLEAR NO ORGANISMS SEEN    Culture   Final    NO GROWTH 3 DAYS Performed at [facility], [address] [place], [place] [address]    Report Status 01/16/2022 FINAL  Final     Radiology Studies: DG CHEST PORT 1 VIEW  Result Date: 01/16/2022 CLINICAL DATA:  Shortness of breath. EXAM: PORTABLE CHEST 1 VIEW COMPARISON:  Radiograph 01/12/2022. FINDINGS: Right upper extremity PICC tip at the atrial caval junction. Increasing right pleural effusion at least moderate in size. Hazy opacity at the right lung base likely represents associated atelectasis. There is a small left pleural effusion. Improving left upper lobe airspace disease from prior exam. There are streaky right upper lobe opacities. No pneumothorax. The heart is upper normal in size. Stable mediastinal contours. IMPRESSION: 1. Increasing right pleural effusion at least moderate in size. Hazy opacity at the right lung base likely represents associated atelectasis. 2. Small left pleural effusion. 3. Improving left upper lobe airspace disease. Streaky right upper lobe opacities may be atelectasis or pneumonia. Electronically Signed   By: Keith Rake M.D.   On: 01/16/2022 12:18    Scheduled Meds:  sodium chloride   Intravenous Once   ascorbic acid  500 mg Oral BID   buprenorphine  2 patch Transdermal Weekly   Chlorhexidine Gluconate Cloth  6 each Topical Daily   docusate sodium  100 mg Oral BID   feeding supplement  237 mL Oral TID BM   furosemide  40 mg Intravenous BID   lipase/protease/amylase  24,000 Units Oral TID WC   magnesium oxide  800 mg Oral Daily   multivitamin with minerals  1 tablet Oral Daily   nortriptyline  30 mg Oral QHS   pantoprazole  40 mg Oral BID   silver sulfADIAZINE   Topical BID   sodium  chloride flush  10-40 mL Intracatheter Q12H   vitamin A  50,000 Units Oral Daily   Vitamin D (Ergocalciferol)  50,000 Units Oral Q7 days   zinc sulfate  220 mg Oral Daily   Continuous Infusions:  albumin human 25 g (01/17/22 0540)     LOS: 10 days   Shelly Coss, MD [facility] P11/07/2021, 10:38 AM

## 2022-01-17 NOTE — Procedures (Signed)
PROCEDURE SUMMARY:  Successful US guided right thoracentesis. Yielded 800 mL of yellow fluid. Pt tolerated procedure well. No immediate complications.  Specimen was not sent for labs. CXR ordered.  EBL < 5 mL  Docia Barrier PA-C 01/17/2022 9:43 AM

## 2022-01-18 DIAGNOSIS — K7030 Alcoholic cirrhosis of liver without ascites: Secondary | ICD-10-CM

## 2022-01-18 DIAGNOSIS — R195 Other fecal abnormalities: Secondary | ICD-10-CM

## 2022-01-18 DIAGNOSIS — K766 Portal hypertension: Secondary | ICD-10-CM

## 2022-01-18 DIAGNOSIS — K3189 Other diseases of stomach and duodenum: Secondary | ICD-10-CM | POA: Insufficient documentation

## 2022-01-18 DIAGNOSIS — E43 Unspecified severe protein-calorie malnutrition: Secondary | ICD-10-CM | POA: Diagnosis not present

## 2022-01-18 LAB — TYPE AND SCREEN
ABO/RH(D): B POS
Antibody Screen: NEGATIVE
Unit division: 0

## 2022-01-18 LAB — CBC
HCT: 24.4 % — ABNORMAL LOW (ref 36.0–46.0)
Hemoglobin: 8.3 g/dL — ABNORMAL LOW (ref 12.0–15.0)
MCH: 31.1 pg (ref 26.0–34.0)
MCHC: 34 g/dL (ref 30.0–36.0)
MCV: 91.4 fL (ref 80.0–100.0)
Platelets: 149 10*3/uL — ABNORMAL LOW (ref 150–400)
RBC: 2.67 MIL/uL — ABNORMAL LOW (ref 3.87–5.11)
RDW: 17.9 % — ABNORMAL HIGH (ref 11.5–15.5)
WBC: 5 10*3/uL (ref 4.0–10.5)
nRBC: 0 % (ref 0.0–0.2)

## 2022-01-18 LAB — BASIC METABOLIC PANEL
Anion gap: 5 (ref 5–15)
BUN: 12 mg/dL (ref 8–23)
CO2: 34 mmol/L — ABNORMAL HIGH (ref 22–32)
Calcium: 7.4 mg/dL — ABNORMAL LOW (ref 8.9–10.3)
Chloride: 97 mmol/L — ABNORMAL LOW (ref 98–111)
Creatinine, Ser: 0.66 mg/dL (ref 0.44–1.00)
GFR, Estimated: >60 mL/min (ref >60)
Glucose, Bld: 114 mg/dL — ABNORMAL HIGH (ref 70–99)
Potassium: 3.3 mmol/L — ABNORMAL LOW (ref 3.5–5.1)
Sodium: 136 mmol/L (ref 135–145)

## 2022-01-18 LAB — BPAM RBC
Blood Product Expiration Date: 202311192359
ISSUE DATE / TIME: 202311051135
Unit Type and Rh: 7300

## 2022-01-18 LAB — CYTOLOGY - NON PAP

## 2022-01-18 LAB — PHOSPHORUS: Phosphorus: 3.4 mg/dL (ref 2.5–4.6)

## 2022-01-18 LAB — PROTIME-INR
INR: 1.4 — ABNORMAL HIGH (ref 0.8–1.2)
Prothrombin Time: 17.2 seconds — ABNORMAL HIGH (ref 11.4–15.2)

## 2022-01-18 LAB — MAGNESIUM: Magnesium: 1.6 mg/dL — ABNORMAL LOW (ref 1.7–2.4)

## 2022-01-18 MED ORDER — MAGNESIUM OXIDE -MG SUPPLEMENT 400 (240 MG) MG PO TABS
800.0000 mg | ORAL_TABLET | Freq: Two times a day (BID) | ORAL | Status: DC
  Administered 2022-01-18 – 2022-01-19 (×4): 800 mg via ORAL
  Filled 2022-01-18 (×4): qty 2

## 2022-01-18 MED ORDER — FUROSEMIDE 40 MG PO TABS
40.0000 mg | ORAL_TABLET | Freq: Two times a day (BID) | ORAL | Status: DC
  Administered 2022-01-18: 40 mg via ORAL
  Filled 2022-01-18: qty 1

## 2022-01-18 MED ORDER — POTASSIUM CHLORIDE CRYS ER 20 MEQ PO TBCR
40.0000 meq | EXTENDED_RELEASE_TABLET | Freq: Two times a day (BID) | ORAL | Status: DC
  Administered 2022-01-18 – 2022-01-25 (×15): 40 meq via ORAL
  Filled 2022-01-18 (×15): qty 2

## 2022-01-18 NOTE — Progress Notes (Signed)
PROGRESS NOTE  Carolyn [name]  WUJ:[id number] DOB: [birth year] DOA: 01/06/2022 PCP: Farris Has, MD   Brief Narrative:  Patient is a 66 year old female with history of chronic alcohol pancreatitis, chronic pain syndrome, GERD, severe malnutrition, osteoporosis who presented initially with right foot wound that he started as a blister/bulla.  Right foot x-ray raised concern for acute osteomyelitis but was asked to repeat MRI, started on antibiotics.  Evaluated by podiatry who recommended antibiotics for 7 days, wound care and outpatient follow-up.  Patient developed abdominal fullness for which KUB was done which showed a pleural effusion.  Chest x-ray confirmed large right pleural effusion.  IR did thoracentesis  x2 . Palliative care consulted for goals of care who recommend outpatient follow-up at a skilled nursing facility.  GI consulted for drop of hemoglobin with positive FOBT.   PT/OT recommending SNF on discharge. Assessment & Plan:  Principal Problem:   Protein-calorie malnutrition, severe (HCC) Active Problems:   Chronic pancreatitis (HCC)   Osteomyelitis (HCC)   Hypokalemia   Hypomagnesemia   AKI (acute kidney injury) (HCC)   Chronic pain syndrome   Cellulitis of right foot   Normocytic anemia   Gastroesophageal reflux disease without esophagitis   Pressure injury of skin   Physical deconditioning   Pleural effusion   Abdominal distention   Acute respiratory failure with hypoxia (HCC)   Hypoalbuminemia   Anasarca   Acute hypoxic respiratory failure: Secondary to right pleural effusion, atelectasis.  Chest x-ray showed large pleural effusion.  BNP was also elevated.  No history of CHF.  Albumin was very low. S/P thora  x 2. She was on  IV Lasix and IV albumin because she was tolerating with preserved kidney function and improvement in the respiratory status. Lasix changed to oral. Echocardiogram showed EF of 70 to 75%, grade 1 diastolic dysfunction. She remains on room air  today  Normocytic anemia: Hemoglobin dropped to the range of 6 today from 8.   She denies any change in the color of the stool.  Positive  FOBT.  She was transfused with a unit of PRBC on 11/5.  Anemia panel done on 10/29 suggests anemia of chronic disease.GI consulted.Continue protonix  Anasarca/hypoalbuminemia: Has dependent edema.  Likely nutritional.  Nutritionist consulted and following  Bilateral feet cellulitis/ulceration: X-ray of right foot raise concern for cellulitis but osteomyelitis ruled out by MRI.  No leukocytosis.  No plan for surgery, podiatry recommended 7 days course of antibiotics, wound care.  She  completed ancef on 10/4.  Continue pain management, supportive care  AKI: Resolved  Abdominal distention: KUB raised concern for ileus.  Now having small bowel movements, passing gas.  Pain medication discontinued.  Continue home Butrans.  Continue bowel regimen  Electrolytes abnormalities: Continue to monitor and supplement as needed.    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  Continue home Butrans, fentanyl as needed, Creon  Mild sinus tachycardia: Monitor on telemetry,now stable  GERD: Continue PPI  Vitamin D/A insufficiency: Being supplemented  Physical deconditioning/ambulatory dysfunction: PT/OT recommended SNF  Severe protein calorie malnutrition: nutrition is consulted.  BMI of 16.7.  This is likely associated with chronic pancreatitis, multiple abdominal surgeries  Goals of care: Palliative care also consulted here.  Recommend outpatient follow-up at the SNF  Pressure Injury 01/07/22 Coccyx Mid Stage 1 -  Intact skin with non-blanchable redness of a localized area usually over a bony prominence. (Active)  01/07/22 1848  Location: Coccyx  Location Orientation: Mid  Staging: Stage 1 -  Intact skin with non-blanchable redness of a localized area usually over a bony prominence.  Wound  Description (Comments):   Present on Admission: Yes            Nutrition Problem: Severe Malnutrition Etiology: chronic illness (chronic pancreatitis, h/o multiple gastric surgeries including gastric bypass) Pressure Injury 01/07/22 Coccyx Mid Stage 1 -  Intact skin with non-blanchable redness of a localized area usually over a bony prominence. (Active)  01/07/22 1848  Location: Coccyx  Location Orientation: Mid  Staging: Stage 1 -  Intact skin with non-blanchable redness of a localized area usually over a bony prominence.  Wound Description (Comments):   Present on Admission: Yes  Dressing Type Foam - Lift dressing to assess site every shift 01/17/22 1940    DVT prophylaxis:     Code Status: Full Code  Family Communication: Husband on phone on 11/3 Patient status:Inpatient  Patient is from :Home  Anticipated discharge to:SNF  Estimated DC date:waiting for bed,awaiting GI work up   Consultants: Palliative care,GI  Procedures:None  Antimicrobials:  Anti-infectives (From admission, onward)    Start     Dose/Rate Route Frequency Ordered Stop   01/09/22 1800  ceFAZolin (ANCEF) IVPB 1 g/50 mL premix        1 g 100 mL/hr over 30 Minutes Intravenous Every 8 hours 01/09/22 1513 01/16/22 1759   01/06/22 2030  cefTRIAXone (ROCEPHIN) 2 g in sodium chloride 0.9 % 100 mL IVPB  Status:  Discontinued       See Hyperspace for full Linked Orders Report.   2 g 200 mL/hr over 30 Minutes Intravenous Every 24 hours 01/06/22 2017 01/09/22 1513   01/06/22 2030  metroNIDAZOLE (FLAGYL) IVPB 500 mg  Status:  Discontinued       See Hyperspace for full Linked Orders Report.   500 mg 100 mL/hr over 60 Minutes Intravenous Every 12 hours 01/06/22 2017 01/09/22 1513       Subjective:  Patient seen and examined at bedside today.  Hemodynamically stable.  Lying in bed, looks weak, deconditioned.  No new complaints today.  Cough is better.  She remains on room air  Objective: Vitals:    01/17/22 1153 01/17/22 1342 01/17/22 2018 01/18/22 0548  BP: 127/87 134/86 119/70 125/79  Pulse: (!) 106 (!) 115 97 (!) 103  Resp: 16 16 20 20   Temp: 98.6 F (37 C) 99.1 F (37.3 C) 98.5 F (36.9 C) 99 F (37.2 C)  TempSrc: Oral Oral Oral Oral  SpO2: 99% 97% 100% 95%  Weight:      Height:        Intake/Output Summary (Last 24 hours) at 01/18/2022 1250 Last data filed at 01/18/2022 0730 Gross per 24 hour  Intake 798 ml  Output 1400 ml  Net -602 ml   Filed Weights   01/06/22 1857  Weight: 49.9 kg    Examination:  General exam: Overall comfortable, very weak, deconditioned, chronically ill looking, cachectic HEENT: PERRL Respiratory system: Mildly  diminished air sounds on the right side Cardiovascular system: S1 & S2 heard, RRR.  Gastrointestinal system: Abdomen is nondistended, soft and nontender. Central nervous system: Alert and oriented Extremities: trace bilateral LE edema, no clubbing ,no cyanosis Skin: Skin breakdowns,pressure ulcer as above   Data Reviewed: I have personally reviewed following labs and imaging studies  CBC: Recent Labs  Lab 01/12/22 0427 01/13/22 0257 01/17/22 0259 01/18/22 0258  WBC 11.9* 6.1 3.7* 5.0  HGB 10.8* 8.0* 6.6* 8.3*  HCT 32.7* 24.6* 20.0* 24.4*  MCV 95.3 96.5 95.2 91.4  PLT 129* 94* 106* 149*   Basic Metabolic Panel: Recent Labs  Lab 01/13/22 0257 01/14/22 0238 01/15/22 0450 01/16/22 0410 01/17/22 0259 01/18/22 0258  NA 131* 133* 135 134* 136 136  K 3.9 3.4* 3.4* 3.7 3.2* 3.3*  CL 101 99 98 97* 96* 97*  CO2 26 28 31 31  33* 34*  GLUCOSE 103* 134* 167* 108* 106* 114*  BUN 8 9 9 10 10 12   CREATININE 0.63 0.66 0.57 0.53 0.59 0.66  CALCIUM 7.0* 7.4* 7.0* 7.4* 7.4* 7.4*  MG 1.4* 1.6* 1.8 1.5*  --  1.6*  PHOS 1.7* 1.9* 2.5 2.9  --  3.4     Recent Results (from the past 240 hour(s))  Body fluid culture w Gram Stain     Status: None   Collection Time: 01/12/22 12:30 PM   Specimen: Peritoneal Washings  Result  Value Ref Range Status   Specimen Description   Final    PERITONEAL Performed at [facility], [address], [place], [place]    Special Requests   Final    NONE Performed at [facility], [address] [place], [place]    Gram Stain   Final    RARE WBC PRESENT,BOTH PMN AND MONONUCLEAR NO ORGANISMS SEEN    Culture   Final    NO GROWTH 3 DAYS Performed at [facility], [address] [place], [address] [place]    Report Status 01/16/2022 FINAL  Final     Radiology Studies: 24580 THORACENTESIS ASP PLEURAL SPACE W/IMG GUIDE  Result Date: 01/17/2022 INDICATION: Patient with severe protein calorie malnutrition, acute respiratory failure related to right pleural effusion. Request is made for therapeutic right thoracentesis. EXAM: ULTRASOUND GUIDED RIGHT THORACENTESIS MEDICATIONS: 10 mL% lidocaine COMPLICATIONS: None immediate. PROCEDURE: An ultrasound guided thoracentesis was thoroughly discussed with the patient and questions answered. The benefits, risks, alternatives and complications were also discussed. The patient understands and wishes to proceed with the procedure. Written consent was obtained. Ultrasound was performed to localize and mark an adequate pocket of fluid in the right chest. The area was then prepped and draped in the normal sterile fashion. 1% Lidocaine was used for local anesthesia. Under ultrasound guidance a 6 Fr Safe-T-Centesis catheter was introduced. Thoracentesis was performed. The catheter was removed and a dressing applied. FINDINGS: A total of approximately 800 mL of yellow fluid was removed. IMPRESSION: Successful ultrasound guided right thoracentesis yielding 800 mL of pleural fluid. Read by: [place] PA-C Electronically Signed   By: 13/07/2021 M.D.   On: 01/17/2022 13:07   DG Chest Port 1 View  Result Date: 01/17/2022 CLINICAL DATA:  Status post thoracentesis. EXAM: PORTABLE CHEST 1 VIEW  COMPARISON:  01/16/2022 FINDINGS: Interval decrease in right pleural effusion. No evidence for pneumothorax. Streaky opacity at the lung bases suggest atelectasis. The cardiopericardial silhouette is within normal limits for size. Bones are demineralized. Right PICC line tip overlies the mid to distal SVC level. IMPRESSION: Interval decrease in right pleural effusion without evidence for pneumothorax. Electronically Signed   By: 13/07/2021 M.D.   On: 01/17/2022 11:00    Scheduled Meds:  ascorbic acid  500 mg Oral BID   buprenorphine  2 patch Transdermal Weekly   Chlorhexidine Gluconate Cloth  6 each Topical Daily   docusate sodium  100 mg Oral BID   feeding supplement  237 mL Oral TID BM  furosemide  40 mg Intravenous BID   lipase/protease/amylase  24,000 Units Oral TID WC   magnesium oxide  800 mg Oral BID   multivitamin with minerals  1 tablet Oral Daily   nortriptyline  30 mg Oral QHS   pantoprazole  40 mg Oral BID   potassium chloride  40 mEq Oral BID   silver sulfADIAZINE   Topical BID   sodium chloride flush  10-40 mL Intracatheter Q12H   vitamin A  50,000 Units Oral Daily   Vitamin D (Ergocalciferol)  50,000 Units Oral Q7 days   zinc sulfate  220 mg Oral Daily   Continuous Infusions:  albumin human 25 g (01/18/22 0551)     LOS: 11 days   Shelly Coss, MD [facility] P11/08/2021, 12:50 PM

## 2022-01-18 NOTE — Progress Notes (Signed)
Physical Therapy Treatment Patient Details Name: JIANI HALD MRN: [id number] DOB: [birth year] Today's Date: 01/18/2022   History of Present Illness 66 y.o. female nondiabetic, not a smoker, malnourished, presenting for bilateral foot cellulitis/with ulcers.  Most concerning the right second toe.  X-ray raises concern for cellulitis and osteomyelitis but negative on more sensitive MRI.  Podiatry consulted. PMH: ETOH use, cirrhosis, liver disease, malabsorption syndrome, pancreatitis, Szs    PT Comments    Pt very motivated and able to transfer and ambulate with min assist today.  Pt slow with mobility but tolerated well and reports pain is controlled.  Pt anticipates d/c to SNF for rehab soon.    Recommendations for follow up therapy are one component of a multi-disciplinary discharge planning process, led by the attending physician.  Recommendations may be updated based on patient status, additional functional criteria and insurance authorization.  Follow Up Recommendations  Skilled nursing-short term rehab (<3 hours/day) Can patient physically be transported by private vehicle: Yes   Assistance Recommended at Discharge Frequent or constant Supervision/Assistance  Patient can return home with the following Help with stairs or ramp for entrance;Assist for transportation;Assistance with cooking/housework;A little help with walking and/or transfers;A little help with bathing/dressing/bathroom   Equipment Recommendations  Other (comment) (next venue)    Recommendations for Other Services       Precautions / Restrictions Precautions Precautions: Fall Required Braces or Orthoses: Other Brace Other Brace: bilateral "post op" shoes Restrictions Weight Bearing Restrictions: No Other Position/Activity Restrictions: WBAT per podiatry progress note     Mobility  Bed Mobility Overal bed mobility: Needs Assistance Bed Mobility: Supine to Sit     Supine to sit: Min assist     General  bed mobility comments: required increased time; use of  bed rails, assist for trunk to upright    Transfers Overall transfer level: Needs assistance Equipment used: Rolling walker (2 wheels) Transfers: Sit to/from Stand Sit to Stand: Min assist           General transfer comment: verbal cues for hand placement, weight shifting, full upright posture, assist to rise and stabilize    Ambulation/Gait Ambulation/Gait assistance: Min assist, Min guard Gait Distance (Feet): 80 Feet Assistive device: Rolling walker (2 wheels) Gait Pattern/deviations: Trunk flexed, Narrow base of support, Decreased stride length, Step-through pattern Gait velocity: decreased     General Gait Details: verbal cues for RW positioning, pushing RW not picking up RW, increased time, HR observed at highest 130 bpm   Stairs             Wheelchair Mobility    Modified Rankin (Stroke Patients Only)       Balance Overall balance assessment: History of Falls, Needs assistance         Standing balance support: Bilateral upper extremity supported, Reliant on assistive device for balance, During functional activity Standing balance-Leahy Scale: Poor                              Cognition Arousal/Alertness: Awake/alert Behavior During Therapy: WFL for tasks assessed/performed Overall Cognitive Status: Within Functional Limits for tasks assessed                                          Exercises      General Comments        Pertinent Vitals/Pain  Pain Assessment Pain Assessment: Faces Faces Pain Scale: Hurts a little bit Pain Location: feet and lower legs Pain Descriptors / Indicators: Sore Pain Intervention(s): Monitored during session, Repositioned (reports she received pain meds and pain controlled)    Home Living                          Prior Function            PT Goals (current goals can now be found in the care plan section)  Progress towards PT goals: Progressing toward goals    Frequency    Min 2X/week      PT Plan Current plan remains appropriate    Co-evaluation              AM-PAC PT "6 Clicks" Mobility   Outcome Measure  Help needed turning from your back to your side while in a flat bed without using bedrails?: A Little Help needed moving from lying on your back to sitting on the side of a flat bed without using bedrails?: A Little Help needed moving to and from a bed to a chair (including a wheelchair)?: A Little Help needed standing up from a chair using your arms (e.g., wheelchair or bedside chair)?: A Lot Help needed to walk in hospital room?: A Lot Help needed climbing 3-5 steps with a railing? : A Lot 6 Click Score: 15    End of Session Equipment Utilized During Treatment: Gait belt Activity Tolerance: Patient tolerated treatment well Patient left: in chair;with call bell/phone within reach;with chair alarm set Nurse Communication: Mobility status PT Visit Diagnosis: Other abnormalities of gait and mobility (R26.89)     Time: 4765-4650 PT Time Calculation (min) (ACUTE ONLY): 21 min  Charges:  $Gait Training: 8-22 mins                     Jannette Spanner PT, DPT Physical Therapist Acute Rehabilitation Services Preferred contact method: Secure Chat Weekend Pager Only: [phone] Office: [phone]    Myrtis Hopping [place] 01/18/2022, 1:21 PM

## 2022-01-18 NOTE — Consult Note (Addendum)
Referring Provider: Dr. Shelly Coss Primary Care Physician:  London Pepper, MD Primary Gastroenterologist:  Dr. Thornton Park   Reason for Consultation: Anemia, positive FOBT  HPI: Carolyn [name] is a 66 y.o. female with a past medical history of osteoporosis, alcohol use disorder, alcohol associated cirrhosis, thrombocytopenia, alcohol associated chronic pancreatitis, chronic pain syndrome and neuropathy. S/P Roux-en-Y gastric bypass surgery and cholecystectomy more than 20 years ago with  placement of a J-tube for enteral feeding which was subsequently removed.  She presented to [facility] ED 01/06/2022 with complaints of a large blister on the sole of her right foot and a right 2nd toe ulceration concerning for osteomyelitis.  She was transferred to [facility] for admission and she was placed on IV antibiotics.  She developed abdominal fullness during this hospitalization and a KUB showed a pleural effusion.  A chest x-ray confirmed a large right pleural effusion and she underwent a thoracentesis by IR and received IV Lasix and albumin IV.  No cough or shortness of breath.  A GI consult was requested due to worsening anemia, hemoglobin 10.9 down to 6.6  with a positive FOBT.  Admission Hg 10.9 -> 13.8 -> 10.2 -> 10 -> 9.8 -> 8.5 on 10/30 -> 10.8 -> 8.0  on 11/1 -> 6.6 on 11/5 transfused one unit of PRBCs -> and Hg 8.3.  Iron 58.  B12 level 1,325.   She denies having any nausea or vomiting.  No dysphagia or heartburn.  She typically does not experience any upper or lower abdominal pain.  However, since admitted to the hospital she had RLQ pain which she noticed on 2 separate days without recurrence.  She typically passes a soft yellow bowel movement every other day.  She passed a bowel movement earlier today which was reported as yellow per the nursing staff.  No rectal bleeding or black stools.  Decreased appetite for the past 3 to 4 weeks which she attributes to having  worsening right foot pain.  Nutritional intake at home has been poor.  She has lost weight but is not sure how much weight she has lost.  She took Advil 2 tabs 3 times daily for 3 weeks prior to this admission.  She last drank 2 glasses of wine 2 months ago.  She stopped heavy alcohol intake about 2 years ago.   She has a history of alcohol associated cirrhosis. She was previously followed by [facility] liver clinic from 2015 through 2017 and followed by Dr. Oletta Lamas with [facility] for few years then established initial televisit with Dr. Thornton Park in our outpatient GI clinic 08/24/2018. No history of decompensation including jaundice, variceal bleeding, encephalopathy, ascites.  No varices on EGD at [facility] 07/25/2013.  No prior history of GI bleed. No history of use or experimentation with IV or intranasal street drugs.  No history of autoimmune disease.  She denies ever having a screening colonoscopy, was told by a provider at [facility] that she was too high risk for a colonoscopy secondary to multiple abdominal surgeries with suspected adhesions.  Evaluation in 2017, the [facility] liver clinic did not feel that she need to be evaluated for liver transplant and thought that her prior surgeries may be a barrier to transplant.   No known family history of esophageal, gastric or colon cancer.  Father with history of alcohol associated cirrhosis.  Past Medical History:  Diagnosis Date   Alcoholic cirrhosis ([place])    Chronic pancreatitis ([place])  Endometriosis    GERD (gastroesophageal reflux disease)    Liver disease    Malabsorption    MALABSORPTION SYNDROME   Osteoporosis 03/2011   t score -2.5   Pancreatitis    due to cyst and tumors due to calcifications   Seizure ([place])    no seizure disorder   Vitamin D deficiency 2012   VIT D 15    Past Surgical History:  Procedure Laterality Date   APPENDECTOMY     CHOLECYSTECTOMY     FEEDING TUBE RELOCATION  2010   JEJUNOSTOMY FEEDING TUBE  1990   MULTIPLE  GASTRIC SURGERIES     MYOMECTOMY     OPEN REDUCTION INTERNAL FIXATION (ORIF) DISTAL RADIAL FRACTURE Left 06/29/2018   Procedure: OPEN REDUCTION INTERNAL FIXATION (ORIF)LEFT  DISTAL RADIAL FRACTURE;  Surgeon: Leanora Cover, MD;  Location: [place];  Service: Orthopedics;  Laterality: Left;   ROUX-EN-Y GASTRIC BYPASS     TUBAL LIGATION      Prior to Admission medications   Medication Sig Start Date End Date Taking? Authorizing Provider  buprenorphine (BUTRANS) 20 MCG/HR PTWK Place 1 patch onto the skin once a week. 02/23/21  Yes [provider]  calcium carbonate (OS-CAL - DOSED IN MG OF ELEMENTAL CALCIUM) 1250 MG tablet Take 1 tablet by mouth daily.   Yes [provider]  famotidine (PEPCID) 10 MG tablet Take 10 mg by mouth 2 (two) times daily.   Yes [provider]  ibuprofen (ADVIL,MOTRIN) 200 MG tablet Take 600 mg by mouth every 6 (six) hours as needed for moderate pain.   Yes [provider]  lidocaine (XYLOCAINE) 2 % solution Use as directed 15 mLs in the mouth or throat every 6 (six) hours as needed for mouth pain. 09/05/20  Yes Cardama, Grayce Sessions, MD  lipase/protease/amylase (CREON) 12000 units CPEP capsule Take 2 capsules (24,000 Units total) by mouth 3 (three) times daily with meals. 08/24/18  Yes Thornton Park, MD  Multiple Vitamin (MULITIVITAMIN WITH MINERALS) TABS Take 1 tablet by mouth daily.   Yes [provider]  nortriptyline (PAMELOR) 10 MG capsule Take 30 mg by mouth at bedtime. 04/13/21  Yes [provider]  ondansetron (ZOFRAN) 4 MG tablet Take 4 mg by mouth every 8 (eight) hours as needed for nausea or vomiting.   Yes [provider]  tiZANidine (ZANAFLEX) 2 MG tablet Take 2 mg by mouth every 6 (six) hours as needed for muscle spasms.   Yes [provider]  vitamin B-12 (CYANOCOBALAMIN) 100 MCG tablet Take 100 mcg by mouth daily.   Yes [provider]  vitamin C (ASCORBIC  ACID) 250 MG tablet Take 250 mg by mouth daily.   Yes [provider]  ascorbic acid (VITAMIN C) 500 MG tablet Take 1 tablet (500 mg total) by mouth 2 (two) times daily. 01/09/22   Mercy Riding, MD  Hyoscyamine Sulfate SL (LEVSIN/SL) 0.125 MG SUBL Place 1 each under the tongue 4 (four) times daily as needed for up to 5 days. Patient not taking: Reported on 01/07/2022 09/05/20 09/10/20  Fatima Blank, MD  zinc sulfate 220 (50 Zn) MG capsule Take 1 capsule (220 mg total) by mouth daily. 01/09/22   Mercy Riding, MD  PROMETHAZINE HCL PO Take 4 mg by mouth.   06/06/11  [provider]  Ranitidine HCl (ZANTAC PO) Take by mouth.    06/06/11  [provider]    Current Facility-Administered Medications  Medication Dose Route  Frequency Provider Last Rate Last Admin   furosemide (LASIX) injection 40 mg  40 mg Intravenous BID Shelly Coss, MD   40 mg at 01/18/22 1006   And   albumin human 25 % solution 25 g  25 g Intravenous Q0600 Shelly Coss, MD 60 mL/hr at 01/18/22 0551 25 g at 01/18/22 0551   ascorbic acid (VITAMIN C) tablet 500 mg  500 mg Oral BID Wendee Beavers T, MD   500 mg at 01/18/22 1004   buprenorphine (BUTRANS) 10 MCG/HR 2 patch  2 patch Transdermal Weekly Polly Cobia, RPH   2 patch at 01/16/22 1200   Chlorhexidine Gluconate Cloth 2 % PADS 6 each  6 each Topical Daily Wendee Beavers T, MD   6 each at 01/18/22 1155   docusate sodium (COLACE) capsule 100 mg  100 mg Oral BID Marylyn Ishihara, Tyrone A, DO   100 mg at 01/18/22 1005   feeding supplement (ENSURE ENLIVE / ENSURE PLUS) liquid 237 mL  237 mL Oral TID BM Gonfa, Taye T, MD   237 mL at 01/18/22 1006   guaiFENesin-dextromethorphan (ROBITUSSIN DM) 100-10 MG/5ML syrup 10 mL  10 mL Oral Q6H PRN Shelly Coss, MD   10 mL at 01/17/22 1938   lipase/protease/amylase (CREON) capsule 24,000 Units  24,000 Units Oral TID WC Wendee Beavers T, MD   24,000 Units at 01/18/22 1005   magnesium oxide (MAG-OX) tablet 800 mg  800 mg  Oral BID Shelly Coss, MD   800 mg at 01/18/22 1004   multivitamin with minerals tablet 1 tablet  1 tablet Oral Daily Wendee Beavers T, MD   1 tablet at 01/18/22 1004   nortriptyline (PAMELOR) capsule 30 mg  30 mg Oral QHS Wendee Beavers T, MD   30 mg at 01/17/22 2109   ondansetron (ZOFRAN) injection 4 mg  4 mg Intravenous Q8H PRN Marylyn Ishihara, Tyrone A, DO   4 mg at 01/18/22 0340   oxyCODONE (Oxy IR/ROXICODONE) immediate release tablet 5 mg  5 mg Oral Q8H PRN Wendee Beavers T, MD   5 mg at 01/18/22 0417   pantoprazole (PROTONIX) EC tablet 40 mg  40 mg Oral BID Shelly Coss, MD   40 mg at 01/18/22 1006   polyethylene glycol (MIRALAX / GLYCOLAX) packet 17 g  17 g Oral BID PRN Gonfa, Taye T, MD       potassium chloride SA (KLOR-CON M) CR tablet 40 mEq  40 mEq Oral BID Shelly Coss, MD   40 mEq at 01/18/22 1005   senna-docusate (Senokot-S) tablet 1 tablet  1 tablet Oral BID PRN Mercy Riding, MD       silver sulfADIAZINE (SILVADENE) 1 % cream   Topical BID Raenette Rover, NP   Given at 01/17/22 2109   sodium chloride flush (NS) 0.9 % injection 10-40 mL  10-40 mL Intracatheter Q12H Gonfa, Taye T, MD   10 mL at 01/16/22 1009   sodium chloride flush (NS) 0.9 % injection 10-40 mL  10-40 mL Intracatheter PRN Wendee Beavers T, MD   10 mL at 01/13/22 1643   tiZANidine (ZANAFLEX) tablet 2 mg  2 mg Oral Q12H PRN Shelly Coss, MD   2 mg at 01/18/22 8882   vitamin A capsule 50,000 Units  50,000 Units Oral Daily Shelly Coss, MD   50,000 Units at 01/18/22 1004   Vitamin D (Ergocalciferol) (DRISDOL) 1.25 MG (50000 UNIT) capsule 50,000 Units  50,000 Units Oral Q7 days Shelly Coss, MD   50,000 Units at 01/13/22  1226   zinc sulfate capsule 220 mg  220 mg Oral Daily Wendee Beavers T, MD   220 mg at 01/18/22 1004    Allergies as of 01/06/2022 - Review Complete 01/06/2022  Allergen Reaction Noted   Peanut-containing drug products Anaphylaxis 03/11/2011    Family History  Problem Relation Age of Onset   Cancer Father         BONE   Breast cancer Maternal Grandmother     Social History   Socioeconomic History   Marital status: Single    Spouse name: Not on file   Number of children: Not on file   Years of education: Not on file   Highest education level: Not on file  Occupational History   Not on file  Tobacco Use   Smoking status: Never   Smokeless tobacco: Never  Substance and Sexual Activity   Alcohol use: Yes    Comment: occ ( none in several weeks)   Drug use: No   Sexual activity: Never    Partners: Male  Other Topics Concern   Not on file  Social History Narrative   Not on file   Social Determinants of Health   Financial Resource Strain: Not on file  Food Insecurity: No Food Insecurity (01/07/2022)   Hunger Vital Sign    Worried About Running Out of Food in the Last Year: Never true    Ran Out of Food in the Last Year: Never true  Transportation Needs: No Transportation Needs (01/07/2022)   PRAPARE - Hydrologist (Medical): No    Lack of Transportation (Non-Medical): No  Physical Activity: Not on file  Stress: Not on file  Social Connections: Not on file  Intimate Partner Violence: Not At Risk (01/07/2022)   Humiliation, Afraid, Rape, and Kick questionnaire    Fear of Current or Ex-Partner: No    Emotionally Abused: No    Physically Abused: No    Sexually Abused: No    Review of Systems: Gen: + Weight loss.  CV: Denies chest pain, palpitations or edema. Resp: Denies cough, shortness of breath of hemoptysis.  GI: See HPI. GU : Denies urinary burning, blood in urine, increased urinary frequency or incontinence. MS: Denies joint pain, muscles aches or weakness. Derm: Denies rash, itchiness, skin lesions or unhealing ulcers. Psych: Denies depression, anxiety, memory loss or confusion. Heme: Denies easy bruising, bleeding. Neuro:  Denies headaches, dizziness or paresthesias. Endo:  Denies any problems with DM, thyroid or adrenal  function.  Physical Exam: Vital signs in last 24 hours: Temp:  [98.5 F (36.9 C)-99.1 F (37.3 C)] 99 F (37.2 C) (11/06 0548) Pulse Rate:  [97-115] 103 (11/06 0548) Resp:  [16-20] 20 (11/06 0548) BP: (119-134)/(70-86) 125/79 (11/06 0548) SpO2:  [95 %-100 %] 95 % (11/06 0548) Last BM Date : 01/17/22 General:  Alert cachectic appearing 66 year old female in no acute distress. Head:  Normocephalic and atraumatic. Eyes:  No scleral icterus. Conjunctiva pink. Ears:  Normal auditory acuity. Nose:  No deformity, discharge or lesions. Mouth:  Dentition intact. No ulcers or lesions.  Neck:  Supple. No lymphadenopathy or thyromegaly.  Lungs: Breath sounds diminished throughout.  No crackles, wheezes or rhonchi. Heart: Regular rate and rhythm, systolic murmur. Abdomen: Soft, nondistended.  Significant midline abdominal scar intact.  No ascites.  Suprapubic anasarca.  No hepatosplenomegaly. Rectal: Deferred. Musculoskeletal:  Symmetrical without gross deformities.  Pulses:  Normal pulses noted. Extremities: Right and left foot/ankle dressings intact.  No edema. Neurologic:  Alert and  oriented x 4. No focal deficits.  Moves all extremities weakly.  Speech is clear.  No asterixis. Skin:  Intact without significant lesions or rashes. Psych:  Alert and cooperative. Normal mood and affect.  Intake/Output from previous day: 11/05 0701 - 11/06 0700 In: 825.8 [P.O.:420; I.V.:30; Blood:248; IV Piggyback:127.8] Out: 1400 [Urine:1400] Intake/Output this shift: Total I/O In: 100 [IV Piggyback:100] Out: -   ECHO 01/15/2022: Vigorous LV systolic function; proximal septal thickening with systolic anterior motion of mitral valve with LVOT gradient of 3.5 m/s; suggest cardiac MRI to R/O HCM. 1. Left ventricular ejection fraction, by estimation, is 70 to 75%. The left ventricle has hyperdynamic function. The left ventricle has no regional wall motion abnormalities. There is mild left ventricular  hypertrophy of the basal-septal segment. Left ventricular diastolic parameters are consistent with Grade I diastolic dysfunction (impaired relaxation). 2. Right ventricular systolic function is normal. The right ventricular size is normal. There is normal pulmonary artery systolic pressure. 3. The mitral valve is normal in structure. Trivial mitral valve regurgitation. No evidence of mitral stenosis. 4. The aortic valve is tricuspid. Aortic valve regurgitation is trivial. Aortic valve sclerosis is present, with no evidence of aortic valve stenosis. 5. The inferior vena cava is normal in size with greater than 50% respiratory variability, suggesting right atrial pressure of 3 mmHg.  Lab Results:  Recent Labs    01/17/22 0259 01/18/22 0258  WBC 3.7* 5.0  HGB 6.6* 8.3*  HCT 20.0* 24.4*  PLT 106* 149*   BMET Recent Labs    01/16/22 0410 01/17/22 0259 01/18/22 0258  NA 134* 136 136  K 3.7 3.2* 3.3*  CL 97* 96* 97*  CO2 31 33* 34*  GLUCOSE 108* 106* 114*  BUN _0 CREATININE 0.53 0.59 0.66  CALCIUM 7.4* 7.4* 7.4*   LFT Recent Labs    01/17/22 0830  PROT 4.7*  ALBUMIN 2.8*  AST 31  ALT 8  ALKPHOS 126  BILITOT 0.8  BILIDIR 0.3*  IBILI 0.5   PT/INR No results for input(s): "LABPROT", "INR" in the last 72 hours. Hepatitis Panel No results for input(s): "HEPBSAG", "HCVAB", "HEPAIGM", "HEPBIGM" in the last 72 hours.    Studies/Results: US THORACENTESIS ASP PLEURAL SPACE W/IMG GUIDE  Result Date: 01/17/2022 INDICATION: Patient with severe protein calorie malnutrition, acute respiratory failure related to right pleural effusion. Request is made for therapeutic right thoracentesis. EXAM: ULTRASOUND GUIDED RIGHT THORACENTESIS MEDICATIONS: 10 mL% lidocaine COMPLICATIONS: None immediate. PROCEDURE: An ultrasound guided thoracentesis was thoroughly discussed with the patient and questions answered. The benefits, risks, alternatives and complications were also  discussed. The patient understands and wishes to proceed with the procedure. Written consent was obtained. Ultrasound was performed to localize and mark an adequate pocket of fluid in the right chest. The area was then prepped and draped in the normal sterile fashion. 1% Lidocaine was used for local anesthesia. Under ultrasound guidance a 6 Fr Safe-T-Centesis catheter was introduced. Thoracentesis was performed. The catheter was removed and a dressing applied. FINDINGS: A total of approximately 800 mL of yellow fluid was removed. IMPRESSION: Successful ultrasound guided right thoracentesis yielding 800 mL of pleural fluid. Read by: Brynda Greathouse PA-C Electronically Signed   By: Sandi Mariscal M.D.   On: 01/17/2022 13:07   DG Chest Port 1 View  Result Date: 01/17/2022 CLINICAL DATA:  Status post thoracentesis. EXAM: PORTABLE CHEST 1 VIEW COMPARISON:  01/16/2022 FINDINGS: Interval decrease in right pleural effusion. No evidence for pneumothorax. Streaky  opacity at the lung bases suggest atelectasis. The cardiopericardial silhouette is within normal limits for size. Bones are demineralized. Right PICC line tip overlies the mid to distal SVC level. IMPRESSION: Interval decrease in right pleural effusion without evidence for pneumothorax. Electronically Signed   By: Misty Stanley M.D.   On: 01/17/2022 11:00    IMPRESSION/PLAN:  33) 66 year old female admitted to the hospital 01/06/2022 with a right foot wound and right second toe ulceration concern for osteomyelitis treated with IV antibiotics who developed progressive anemia since admission.  FOBT + without obvious active GI bleeding.  Patient is passing yellow soft stools.  No bright red rectal bleeding or melena reported.  Admission Hg 10.9 -> 13.8 -> 10.2 -> 10 -> 9.8 -> 8.5 on 10/30 -> 10.8 -> 8.0  on 11/1 -> 6.6 on 11/5 transfused one unit of PRBCs -> and Hg 8.3.  Iron 58.  B12 level 1,325. Hemodynamically stable. -Transfuse for hemoglobin less than  7 -Continue Pantoprazole 40 mg p.o. twice daily -Monitor the patient for active GI bleeding -CBC in a.m. -EGD to survey for anastomotic ulcer/PUD in setting of NSAID use, EGD likely 11/8, will make NPO after midnight 11/7 in case EGD availability in endo opens up   2) History of GERD -See plan in # 1  3) Alcohol associated cirrhosis.  Albumin 2.0.  INR not done.  Total bili 0.8.  Alk phos 126.  AST 31.  ALT 8.  On Albumin 25 g IV QD.  EGD at [facility] 07/2013 without varices.  No recent surveillance abdominal imaging. -INR -INR, CMP in a.m, calculate MELD score -EGD to survey for esophageal varices and to rule out anastomotic ulcers with + NSAID use.  See plan in # 1. Await further endoscopic evaluation recommendations and timing to Dr. Tarri Glenn. -Complete abdominal sonogram to evaluate the liver and spleen -Complete alcohol cessation recommended  4) Right pleural effusion s/p thoracentesis per IR 10/31 (1.2 L removed) and 11/5 (864m removed).  Cytology pending. Pleural protein < 3 with elevated pleural LDH level of 29. Chest x-ray 11/5 showed decreased right pleural effusion without evidence of a pneumothorax.  On Furosemide 40 mg IV twice daily.  Echo with LVEF 70 to 75%, normal RV function.  5) Chronic pancreatitis -Continue Creon tid with each meal -Pain management per the hospitalist  6) Severe malnutrition, hypoalbuminemia. -Registered dietitian following    CNoralyn Pick 01/18/2022, 1:41PM

## 2022-01-18 NOTE — Progress Notes (Signed)
Daily Progress Note   Patient Name: Carolyn [name]       Date: 01/18/2022 DOB: 03/11/1956  Age: 66 y.o. MRN#: [id number] Attending Physician: Burnadette Pop, MD Primary Care Physician: Farris Has, MD Admit Date: 01/06/2022  Reason for Consultation/Follow-up: Establishing goals of care  Subjective: Awake alert oriented, sitting up in a chair, states that she is feeling better, is trying to participate more with PT, discussed about pain management, see below  Length of Stay: 11  Current Medications: Scheduled Meds:   ascorbic acid  500 mg Oral BID   buprenorphine  2 patch Transdermal Weekly   Chlorhexidine Gluconate Cloth  6 each Topical Daily   docusate sodium  100 mg Oral BID   feeding supplement  237 mL Oral TID BM   furosemide  40 mg Intravenous BID   lipase/protease/amylase  24,000 Units Oral TID WC   magnesium oxide  800 mg Oral BID   multivitamin with minerals  1 tablet Oral Daily   nortriptyline  30 mg Oral QHS   pantoprazole  40 mg Oral BID   potassium chloride  40 mEq Oral BID   silver sulfADIAZINE   Topical BID   sodium chloride flush  10-40 mL Intracatheter Q12H   vitamin A  50,000 Units Oral Daily   Vitamin D (Ergocalciferol)  50,000 Units Oral Q7 days   zinc sulfate  220 mg Oral Daily    Continuous Infusions:  albumin human 25 g (01/18/22 0551)    PRN Meds: guaiFENesin-dextromethorphan, ondansetron (ZOFRAN) IV, oxyCODONE, polyethylene glycol, senna-docusate, sodium chloride flush, tiZANidine  Physical Exam         Awake alert Appears thin and chronically deconditioned Diminished breath sounds right side S1-S2 Has a foot wound  Vital Signs: BP 125/79 (BP Location: Left Arm)   Pulse (!) 103   Temp 99 F (37.2 C) (Oral)   Resp 20   Ht 5\' 8"  (1.727 m)    Wt 49.9 kg   LMP 03/10/2009   SpO2 95%   BMI 16.73 kg/m  SpO2: SpO2: 95 % O2 Device: O2 Device: Room Air O2 Flow Rate: O2 Flow Rate (L/min): 1 L/min  Intake/output summary:  Intake/Output Summary (Last 24 hours) at 01/18/2022 1224 Last data filed at 01/18/2022 0730 Gross per 24 hour  Intake 798 ml  Output 1400  ml  Net -602 ml   LBM: Last BM Date : 01/17/22 Baseline Weight: Weight: 49.9 kg Most recent weight: Weight: 49.9 kg       Palliative Assessment/Data:      Patient Active Problem List   Diagnosis Date Noted   Anasarca 01/12/2022   Acute respiratory failure with hypoxia (HCC) 01/11/2022   Hypoalbuminemia 01/11/2022   Pleural effusion 01/10/2022   Abdominal distention 01/10/2022   Physical deconditioning 01/09/2022   Pressure injury of skin 01/08/2022   Hypokalemia 01/07/2022   Hypomagnesemia 01/07/2022   AKI (acute kidney injury) ([place]) 01/07/2022   Protein-calorie malnutrition, severe ([place]) 01/07/2022   Cellulitis of right foot 01/07/2022   Normocytic anemia 01/07/2022   Osteomyelitis ([place]) 01/06/2022   Intractable nausea and vomiting 27/08/2374   Metabolic acidosis, increased anion gap 06/12/2020   Seizure (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[place]) 05/08/2017   Pancreatic steatorrhea 05/08/2017   Osteoporosis 03/16/2011   Endometriosis    Malabsorption    Chronic pancreatitis ([place])    Vitamin D deficiency     Palliative Care Assessment & Plan   Patient Profile:    Assessment: History 26-year-old lady with chronic pain syndrome, chronic pancreatitis gastroesophageal reflux disease severe protein calorie malnutrition osteoporosis, admitted to hospital medicine service for right foot wound, has anasarca, hypoalbuminemia, initially had acute kidney injury that is now resolved, has chronic pancreatitis pancreatic insufficiency and chronic  pain syndrome.  Palliative medicine team consulted for pain management and goals of care discussions.  Recommendations/Plan: Medication history reviewed, discussed with patient she is on buprenorphine patches, also takes oral 5 mg oxycodone immediate release.  She is participating well with PT, she thinks that her foot wound is getting better, she is on board with the plan for skilled nursing facility in the rehabilitation attempt, I recommend palliative services following the patient in the outpatient setting.  Continue current mode of care.  Goals of Care and Additional Recommendations: Limitations on Scope of Treatment: Full Scope Treatment  Code Status:    Code Status Orders  (From admission, onward)           Start     Ordered   01/07/22 1758  Full code  Continuous        01/07/22 1757           Code Status History     Date Active Date Inactive Code Status Order ID Comments User Context   06/12/2020 1654 06/14/2020 2001 Full Code [id number]  Patrecia Pour, MD Inpatient   06/12/2020 0518 06/12/2020 1654 Full Code [id number]  Horton, Barbette Hair, MD ED       Prognosis:  Unable to determine  Discharge Planning: [facility] for rehab with Palliative care service follow-up  Care plan was discussed with  patient  Thank you for allowing the Palliative Medicine Team to assist in the care of this patient. Mod MDM     Greater than 50%  of this time was spent counseling and coordinating care related to the above assessment and plan.  Loistine Chance, MD  Please contact Palliative Medicine Team phone at [phone] for questions and concerns.

## 2022-01-18 NOTE — Care Management Important Message (Signed)
Important Message  Patient Details IM Letter given Name: Carolyn [name] MRN: [id number] Date of Birth: [birth year]   Medicare Important Message Given:  Yes     Kerin Salen 01/18/2022, 1:55 PM

## 2022-01-18 NOTE — Plan of Care (Signed)

## 2022-01-19 ENCOUNTER — Inpatient Hospital Stay (HOSPITAL_COMMUNITY): Payer: Medicare PPO | Admitting: Certified Registered Nurse Anesthetist

## 2022-01-19 ENCOUNTER — Encounter (HOSPITAL_COMMUNITY)
Admission: EM | Disposition: A | Discharge status: Skilled Nursing Facility | Payer: Self-pay | Source: Home / Self Care | Attending: Internal Medicine

## 2022-01-19 ENCOUNTER — Encounter (HOSPITAL_COMMUNITY): Payer: Self-pay | Admitting: Internal Medicine

## 2022-01-19 ENCOUNTER — Inpatient Hospital Stay (HOSPITAL_COMMUNITY): Payer: Medicare PPO

## 2022-01-19 DIAGNOSIS — D500 Iron deficiency anemia secondary to blood loss (chronic): Secondary | ICD-10-CM

## 2022-01-19 DIAGNOSIS — K259 Gastric ulcer, unspecified as acute or chronic, without hemorrhage or perforation: Secondary | ICD-10-CM

## 2022-01-19 DIAGNOSIS — E43 Unspecified severe protein-calorie malnutrition: Secondary | ICD-10-CM | POA: Diagnosis not present

## 2022-01-19 DIAGNOSIS — K255 Chronic or unspecified gastric ulcer with perforation: Secondary | ICD-10-CM

## 2022-01-19 DIAGNOSIS — K3189 Other diseases of stomach and duodenum: Secondary | ICD-10-CM

## 2022-01-19 DIAGNOSIS — K7460 Unspecified cirrhosis of liver: Secondary | ICD-10-CM

## 2022-01-19 DIAGNOSIS — R935 Abnormal findings on diagnostic imaging of other abdominal regions, including retroperitoneum: Secondary | ICD-10-CM

## 2022-01-19 DIAGNOSIS — K766 Portal hypertension: Secondary | ICD-10-CM

## 2022-01-19 HISTORY — PX: ESOPHAGOGASTRODUODENOSCOPY (EGD) WITH PROPOFOL: SHX5813

## 2022-01-19 HISTORY — PX: BIOPSY: SHX5522

## 2022-01-19 HISTORY — DX: Chronic or unspecified gastric ulcer with perforation: K25.5

## 2022-01-19 LAB — CBC
HCT: 23.8 % — ABNORMAL LOW (ref 36.0–46.0)
Hemoglobin: 8 g/dL — ABNORMAL LOW (ref 12.0–15.0)
MCH: 31.3 pg (ref 26.0–34.0)
MCHC: 33.6 g/dL (ref 30.0–36.0)
MCV: 93 fL (ref 80.0–100.0)
Platelets: 123 10*3/uL — ABNORMAL LOW (ref 150–400)
RBC: 2.56 MIL/uL — ABNORMAL LOW (ref 3.87–5.11)
RDW: 17.2 % — ABNORMAL HIGH (ref 11.5–15.5)
WBC: 4.5 10*3/uL (ref 4.0–10.5)
nRBC: 0 % (ref 0.0–0.2)

## 2022-01-19 LAB — BASIC METABOLIC PANEL
Anion gap: 3 — ABNORMAL LOW (ref 5–15)
BUN: 10 mg/dL (ref 8–23)
CO2: 35 mmol/L — ABNORMAL HIGH (ref 22–32)
Calcium: 7.3 mg/dL — ABNORMAL LOW (ref 8.9–10.3)
Chloride: 97 mmol/L — ABNORMAL LOW (ref 98–111)
Creatinine, Ser: 0.66 mg/dL (ref 0.44–1.00)
GFR, Estimated: >60 mL/min (ref >60)
Glucose, Bld: 122 mg/dL — ABNORMAL HIGH (ref 70–99)
Potassium: 3.4 mmol/L — ABNORMAL LOW (ref 3.5–5.1)
Sodium: 135 mmol/L (ref 135–145)

## 2022-01-19 LAB — HEPATIC FUNCTION PANEL
ALT: 9 U/L (ref 0–44)
AST: 26 U/L (ref 15–41)
Albumin: 2.2 g/dL — ABNORMAL LOW (ref 3.5–5.0)
Alkaline Phosphatase: 122 U/L (ref 38–126)
Bilirubin, Direct: 0.3 mg/dL — ABNORMAL HIGH (ref 0.0–0.2)
Indirect Bilirubin: 0.6 mg/dL (ref 0.3–0.9)
Total Bilirubin: 0.9 mg/dL (ref 0.3–1.2)
Total Protein: 4 g/dL — ABNORMAL LOW (ref 6.5–8.1)

## 2022-01-19 LAB — PHOSPHORUS: Phosphorus: 2.9 mg/dL (ref 2.5–4.6)

## 2022-01-19 LAB — MAGNESIUM: Magnesium: 1.5 mg/dL — ABNORMAL LOW (ref 1.7–2.4)

## 2022-01-19 SURGERY — ESOPHAGOGASTRODUODENOSCOPY (EGD) WITH PROPOFOL
Anesthesia: Monitor Anesthesia Care

## 2022-01-19 MED ORDER — LIDOCAINE 2% (20 MG/ML) 5 ML SYRINGE
INTRAMUSCULAR | Status: DC | PRN
  Administered 2022-01-19: 50 mg via INTRAVENOUS

## 2022-01-19 MED ORDER — PROPOFOL 500 MG/50ML IV EMUL
INTRAVENOUS | Status: DC | PRN
  Administered 2022-01-19: 125 ug/kg/min via INTRAVENOUS

## 2022-01-19 MED ORDER — LACTATED RINGERS IV SOLN
INTRAVENOUS | Status: AC | PRN
  Administered 2022-01-19: 1000 mL via INTRAVENOUS

## 2022-01-19 MED ORDER — FUROSEMIDE 40 MG PO TABS
40.0000 mg | ORAL_TABLET | Freq: Every day | ORAL | Status: DC
  Administered 2022-01-19 – 2022-01-25 (×7): 40 mg via ORAL
  Filled 2022-01-19 (×7): qty 1

## 2022-01-19 MED ORDER — DICYCLOMINE HCL 10 MG PO CAPS
10.0000 mg | ORAL_CAPSULE | Freq: Three times a day (TID) | ORAL | Status: DC | PRN
  Administered 2022-01-19 – 2022-01-24 (×6): 10 mg via ORAL
  Filled 2022-01-19 (×7): qty 1

## 2022-01-19 MED ORDER — PROPOFOL 10 MG/ML IV BOLUS
INTRAVENOUS | Status: DC | PRN
  Administered 2022-01-19: 20 mg via INTRAVENOUS

## 2022-01-19 MED ORDER — SODIUM CHLORIDE 0.9 % IV SOLN: INTRAVENOUS | Status: DC

## 2022-01-19 MED ORDER — SUCRALFATE 1 GM/10ML PO SUSP
1.0000 g | Freq: Three times a day (TID) | ORAL | Status: DC
  Administered 2022-01-19 – 2022-01-25 (×22): 1 g via ORAL
  Filled 2022-01-19 (×21): qty 10

## 2022-01-19 MED ORDER — ONDANSETRON HCL 4 MG/2ML IJ SOLN
INTRAMUSCULAR | Status: DC | PRN
  Administered 2022-01-19: 4 mg via INTRAVENOUS

## 2022-01-19 MED ORDER — PROPOFOL 500 MG/50ML IV EMUL
INTRAVENOUS | Status: AC
  Filled 2022-01-19: qty 50

## 2022-01-19 SURGICAL SUPPLY — 15 items

## 2022-01-19 NOTE — Anesthesia Postprocedure Evaluation (Signed)
Anesthesia Post Note  Patient: Carolyn [name]  Procedure(s) Performed: ESOPHAGOGASTRODUODENOSCOPY (EGD) WITH PROPOFOL BIOPSY     Patient location during evaluation: PACU Anesthesia Type: MAC Level of consciousness: awake and alert Pain management: pain level controlled Vital Signs Assessment: post-procedure vital signs reviewed and stable Respiratory status: spontaneous breathing, nonlabored ventilation and respiratory function stable Cardiovascular status: stable and blood pressure returned to baseline Anesthetic complications: no   No notable events documented.  Last Vitals:  Vitals:   01/19/22 1440 01/19/22 1450  BP: 103/68 128/82  Pulse: (!) 101 95  Resp: 11 11  Temp:    SpO2: 100% 98%    Last Pain:  Vitals:   01/19/22 1440  TempSrc:   PainSc: 0-No pain                 Audry Pili

## 2022-01-19 NOTE — Transfer of Care (Signed)
Immediate Anesthesia Transfer of Care Note  Patient: Carolyn [name]  Procedure(s) Performed: ESOPHAGOGASTRODUODENOSCOPY (EGD) WITH PROPOFOL BIOPSY  Patient Location: Endoscopy Unit  Anesthesia Type:MAC  Level of Consciousness: drowsy  Airway & Oxygen Therapy: Patient Spontanous Breathing and Patient connected to face mask oxygen  Post-op Assessment: Report given to RN and Post -op Vital signs reviewed and stable  Post vital signs: Reviewed and stable  Last Vitals:  Vitals Value Taken Time  BP    Temp    Pulse    Resp    SpO2      Last Pain:  Vitals:   01/19/22 1307  TempSrc: Oral  PainSc: 0-No pain      Patients Stated Pain Goal: 3 (74/12/87 8676)  Complications: No notable events documented.

## 2022-01-19 NOTE — Progress Notes (Signed)
PROGRESS NOTE  Carolyn [name]  FYB:[id number] DOB: 25-Jul-1955 DOA: 01/06/2022 PCP: Farris Has, MD   Brief Narrative:  Patient is a 66 year old female with history of chronic alcohol pancreatitis, chronic pain syndrome, GERD, severe malnutrition, osteoporosis who presented initially with right foot wound that he started as a blister/bulla.  Right foot x-ray raised concern for acute osteomyelitis but was asked to repeat MRI, started on antibiotics.  Evaluated by podiatry who recommended antibiotics for 7 days, wound care and outpatient follow-up.  Patient developed abdominal fullness for which KUB was done which showed a pleural effusion.  Chest x-ray confirmed large right pleural effusion.  IR did thoracentesis  x2 . Palliative care consulted for goals of care who recommend outpatient follow-up at a skilled nursing facility.  GI consulted for drop of hemoglobin with positive FOBT.  Plan for EGD PT/OT recommending SNF on discharge.  Assessment & Plan:  Principal Problem:   Protein-calorie malnutrition, severe (HCC) Active Problems:   Chronic pancreatitis (HCC)   Osteomyelitis (HCC)   Hypokalemia   Hypomagnesemia   AKI (acute kidney injury) (HCC)   Chronic pain syndrome   Cellulitis of right foot   Normocytic anemia   Gastroesophageal reflux disease without esophagitis   Pressure injury of skin   Physical deconditioning   Pleural effusion   Abdominal distention   Acute respiratory failure with hypoxia (HCC)   Hypoalbuminemia   Anasarca   Alcoholic cirrhosis of liver without ascites (HCC)   Occult blood in stools   Portal hypertension (HCC)   Acute hypoxic respiratory failure: Secondary to right pleural effusion, atelectasis.  Chest x-ray showed large pleural effusion.  BNP was also elevated.  No history of CHF.  Albumin was very low. S/P thora  x 2. She was on  IV Lasix and IV albumin because she was tolerating with preserved kidney function and improvement in the respiratory  status. Lasix changed to oral. Echocardiogram showed EF of 70 to 75%, grade 1 diastolic dysfunction. She remains on room air now  Normocytic anemia: Hemoglobin dropped to the range of 6 .   She denies any change in the color of the stool.  Positive  FOBT.  She was transfused with a unit of PRBC on 11/5.  Hemoglobin is stable in the range of 8. anemia panel done on 10/29 suggests anemia of chronic disease.GI consulted.Continue protonix.  Plan for EGD  Anasarca/hypoalbuminemia: Has dependent edema.  Likely nutritional.  Nutritionist consulted and following  Bilateral feet cellulitis/ulceration: X-ray of right foot raise concern for cellulitis but osteomyelitis ruled out by MRI.  No leukocytosis.  No plan for surgery, podiatry recommended 7 days course of antibiotics, wound care.  She  completed ancef on 10/4.  Continue pain management, supportive care  Abdominal cramps: Continue Bentyl  Low magnesium/potassium/phosphorus: Continue to monitor and supplement as needed.    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  Continue home Butrans, fentanyl as needed, Creon  Vitamin D/A insufficiency: Being supplemented  Physical deconditioning/ambulatory dysfunction: PT/OT recommended SNF  Severe protein calorie malnutrition: nutrition is consulted.  BMI of 16.7.  This is likely associated with chronic pancreatitis, multiple abdominal surgeries  Goals of care: Palliative care also consulted here.  Recommend outpatient follow-up at the SNF  Pressure Injury 01/07/22 Coccyx Mid Stage 1 -  Intact skin with non-blanchable redness of a localized area usually over a bony prominence. (Active)  01/07/22 1848  Location: Coccyx  Location Orientation: Mid  Staging: Stage 1 -  Intact skin with non-blanchable redness of a localized area usually over a bony prominence.  Wound Description (Comments):   Present on Admission: Yes      Nutrition  Problem: Severe Malnutrition Etiology: chronic illness (chronic pancreatitis, h/o multiple gastric surgeries including gastric bypass) Pressure Injury 01/07/22 Coccyx Mid Stage 1 -  Intact skin with non-blanchable redness of a localized area usually over a bony prominence. (Active)  01/07/22 1848  Location: Coccyx  Location Orientation: Mid  Staging: Stage 1 -  Intact skin with non-blanchable redness of a localized area usually over a bony prominence.  Wound Description (Comments):   Present on Admission: Yes  Dressing Type Foam - Lift dressing to assess site every shift 01/18/22 1004    DVT prophylaxis:     Code Status: Full Code  Family Communication: Husband on phone on 11/7  Patient status:Inpatient  Patient is from :Home  Anticipated discharge to:SNF  Estimated DC date:waiting for bed,awaiting GI work up   Consultants: Palliative care,GI  Procedures:[place]ora  x2   Antimicrobials:  Anti-infectives (From admission, onward)    Start     Dose/Rate Route Frequency Ordered Stop   01/09/22 1800  ceFAZolin (ANCEF) IVPB 1 g/50 mL premix        1 g 100 mL/hr over 30 Minutes Intravenous Every 8 hours 01/09/22 1513 01/16/22 1759   01/06/22 2030  cefTRIAXone (ROCEPHIN) 2 g in sodium chloride 0.9 % 100 mL IVPB  Status:  Discontinued       See Hyperspace for full Linked Orders Report.   2 g 200 mL/hr over 30 Minutes Intravenous Every 24 hours 01/06/22 2017 01/09/22 1513   01/06/22 2030  metroNIDAZOLE (FLAGYL) IVPB 500 mg  Status:  Discontinued       See Hyperspace for full Linked Orders Report.   500 mg 100 mL/hr over 60 Minutes Intravenous Every 12 hours 01/06/22 2017 01/09/22 1513       Subjective:  Patient seen and examined at bedside today.  Hemodynamically stable, looks comfortable.  Remains on room air.  Still having some cough but does not complain of any dyspnea.  Complains of abdominal cramps today.  Objective: Vitals:   01/18/22 0548 01/18/22 1409 01/18/22 2125  01/19/22 0645  BP: 125/79 112/72 (!) 143/85 123/83  Pulse: (!) 103 (!) 106 100 94  Resp: 20 16 18 17   Temp: 99 F (37.2 C) 98.6 F (37 C) 99 F (37.2 C) 98.4 F (36.9 C)  TempSrc: Oral Oral Oral Oral  SpO2: 95% 100% 97% 93%  Weight:      Height:        Intake/Output Summary (Last 24 hours) at 01/19/2022 1106 Last data filed at 01/18/2022 2305 Gross per 24 hour  Intake --  Output 2400 ml  Net -2400 ml   Filed Weights   01/06/22 1857  Weight: 49.9 kg    Examination:  General exam: Overall comfortable, weak/deconditioned chronically ill looking cachectic HEENT: PERRL Respiratory system:  no wheezes or crackles, mildly diminished sounds on the right side Cardiovascular system: S1 & S2 heard, RRR.  Gastrointestinal system: Abdomen is nondistended, soft and nontender. Central nervous system: Alert and oriented Extremities: Anasarca, no clubbing ,no cyanosis Skin: Skin breakdown   Data Reviewed: I have personally reviewed following labs and imaging studies  CBC: Recent Labs  Lab 01/13/22 0257 01/17/22 0259 01/18/22 0258 01/19/22 0328  WBC 6.1 3.7* 5.0 4.5  HGB 8.0* 6.6* 8.3* 8.0*  HCT 24.6* 20.0* 24.4* 23.8*  MCV 96.5 95.2 91.4  93.0  PLT 94* 106* 149* 546*   Basic Metabolic Panel: Recent Labs  Lab 01/14/22 0238 01/15/22 0450 01/16/22 0410 01/17/22 0259 01/18/22 0258 01/19/22 0328  NA 133* 135 134* 136 136 135  K 3.4* 3.4* 3.7 3.2* 3.3* 3.4*  CL 99 98 97* 96* 97* 97*  CO2 28 31 31  33* 34* 35*  GLUCOSE 134* 167* 108* 106* 114* 122*  BUN 9 9 10 10 12 10   CREATININE 0.66 0.57 0.53 0.59 0.66 0.66  CALCIUM 7.4* 7.0* 7.4* 7.4* 7.4* 7.3*  MG 1.6* 1.8 1.5*  --  1.6* 1.5*  PHOS 1.9* 2.5 2.9  --  3.4 2.9     Recent Results (from the past 240 hour(s))  Body fluid culture w Gram Stain     Status: None   Collection Time: 01/12/22 12:30 PM   Specimen: Peritoneal Washings  Result Value Ref Range Status   Specimen Description   Final    PERITONEAL Performed at  [facility], [place] [address] [place], [place] [address]    Special Requests   Final    NONE Performed at [facility], [place] [address] [place], [place] [address]    Gram Stain   Final    RARE WBC PRESENT,BOTH PMN AND MONONUCLEAR NO ORGANISMS SEEN    Culture   Final    NO GROWTH 3 DAYS Performed at [facility], [place] [address] [place], [place] [address]    Report Status 01/16/2022 FINAL  Final     Radiology Studies: US Abdomen Complete  Result Date: 01/19/2022 CLINICAL DATA:  Cirrhosis. EXAM: ABDOMEN ULTRASOUND COMPLETE COMPARISON:  05/21/2013 FINDINGS: Gallbladder: Surgically absent. Common bile duct: Diameter: 13 mm in the hepatoduodenal ligament. Liver: Nodular liver contour suggests cirrhosis. No focal mass lesion evident. Portal vein is patent on color Doppler imaging with normal direction of blood flow towards the liver. IVC: No abnormality visualized. Pancreas: Partially obscured by bowel gas. No evidence for main duct dilatation. Spleen: Size and appearance within normal limits. Right Kidney: Length: 9.4 cm prominent right renal pelvis, similar to prior. Echogenicity within normal limits. No mass or hydronephrosis visualized. Left Kidney: Length: 10.6 cm. Echogenicity within normal limits. No mass or hydronephrosis visualized. Abdominal aorta: No aneurysm visualized. Other findings: Free fluid is seen around the liver with evidence of right pleural effusion. IMPRESSION: 1. Nodular liver contour suggests cirrhosis. No focal mass lesion identified. 2. Common bile duct measures 13 mm in the hepatoduodenal ligament. This may be related to prior cholecystectomy but correlation with liver function test recommended. 3. Perihepatic ascites. 4. Right pleural effusion. Electronically Signed   By: Misty Stanley M.D.   On: 01/19/2022 08:53    Scheduled Meds:  ascorbic acid  500 mg Oral BID   buprenorphine  2 patch Transdermal Weekly   Chlorhexidine  Gluconate Cloth  6 each Topical Daily   docusate sodium  100 mg Oral BID   feeding supplement  237 mL Oral TID BM   furosemide  40 mg Oral Daily   lipase/protease/amylase  24,000 Units Oral TID WC   magnesium oxide  800 mg Oral BID   multivitamin with minerals  1 tablet Oral Daily   nortriptyline  30 mg Oral QHS   pantoprazole  40 mg Oral BID   potassium chloride  40 mEq Oral BID   silver sulfADIAZINE   Topical BID   sodium chloride flush  10-40 mL Intracatheter Q12H   vitamin A  50,000 Units Oral  Daily   Vitamin D (Ergocalciferol)  50,000 Units Oral Q7 days   zinc sulfate  220 mg Oral Daily   Continuous Infusions:     LOS: 12 days   Burnadette Pop, MD [facility] P11/09/2021, 11:06 AM

## 2022-01-19 NOTE — Interval H&P Note (Signed)
History and Physical Interval Note:  01/19/2022 2:06 PM  Carolyn [name]  has presented today for surgery, with the diagnosis of Anemia, + FOBT.  The various methods of treatment have been discussed with the patient and family. After consideration of risks, benefits and other options for treatment, the patient has consented to  Procedure(s): ESOPHAGOGASTRODUODENOSCOPY (EGD) WITH PROPOFOL (N/A) as a surgical intervention.  The patient's history has been reviewed, patient examined, no change in status, stable for surgery.  I have reviewed the patient's chart and labs.  Questions were answered to the patient's satisfaction.     Dominic Pea Saladin Petrelli

## 2022-01-19 NOTE — Progress Notes (Signed)
   [facility] Progress Note  CC:  Anemia, positive FOBT   Subjective: She is tolerating a clear liquid diet. No N/V. She is having mild lower abdominal cramping, no significant abdominal pain. She passes a moderate amount of soft yellow stool at this time. No rectal bleeding. No melena. No CP or SOB.    Objective:  Vital signs in last 24 hours: Temp:  [98.4 F (36.9 C)-99 F (37.2 C)] 98.4 F (36.9 C) (11/07 0645) Pulse Rate:  [94-106] 94 (11/07 0645) Resp:  [16-18] 17 (11/07 0645) BP: (112-143)/(72-85) 123/83 (11/07 0645) SpO2:  [93 %-100 %] 93 % (11/07 0645) Last BM Date : 01/18/22  General: Chronically ill, cachectic appearing 66 year old female in NAD. Heart: RRR, no murmur.  Pulm: Breath sounds clear throughout. Abdomen: Soft, nondistended.  Positive bowel sounds all quadrants.  No ascites.  Midline and LUQ scar intact. Extremities:  Right and left foot/ankle drsgs intact.  Neurologic:  Alert and  oriented x 4. Moves all extremities weakly. Speech is clear. Psych:  Alert and cooperative. Normal mood and affect.  Intake/Output from previous day: 11/06 0701 - 11/07 0700 In: 100 [IV Piggyback:100] Out: 2400 [Urine:2400] Intake/Output this shift: No intake/output data recorded.  Lab Results: Recent Labs    01/17/22 0259 01/18/22 0258 01/19/22 0328  WBC 3.7* 5.0 4.5  HGB 6.6* 8.3* 8.0*  HCT 20.0* 24.4* 23.8*  PLT 106* 149* 123*   BMET Recent Labs    01/17/22 0259 01/18/22 0258 01/19/22 0328  NA 136 136 135  K 3.2* 3.3* 3.4*  CL 96* 97* 97*  CO2 33* 34* 35*  GLUCOSE 106* 114* 122*  BUN 10 12 10  CREATININE 0.59 0.66 0.66  CALCIUM 7.4* 7.4* 7.3*   LFT Recent Labs    01/19/22 0328  PROT 4.0*  ALBUMIN 2.2*  AST 26  ALT 9  ALKPHOS 122  BILITOT 0.9  BILIDIR 0.3*  IBILI 0.6   PT/INR Recent Labs    01/18/22 2044  LABPROT 17.2*  INR 1.4*   Hepatitis Panel No results for input(s): "HEPBSAG", "HCVAB", "HEPAIGM", "HEPBIGM" in the  last 72 hours.  US THORACENTESIS ASP PLEURAL SPACE W/IMG GUIDE  Result Date: 01/17/2022 INDICATION: Patient with severe protein calorie malnutrition, acute respiratory failure related to right pleural effusion. Request is made for therapeutic right thoracentesis. EXAM: ULTRASOUND GUIDED RIGHT THORACENTESIS MEDICATIONS: 10 mL% lidocaine COMPLICATIONS: None immediate. PROCEDURE: An ultrasound guided thoracentesis was thoroughly discussed with the patient and questions answered. The benefits, risks, alternatives and complications were also discussed. The patient understands and wishes to proceed with the procedure. Written consent was obtained. Ultrasound was performed to localize and mark an adequate pocket of fluid in the right chest. The area was then prepped and draped in the normal sterile fashion. 1% Lidocaine was used for local anesthesia. Under ultrasound guidance a 6 Fr Safe-T-Centesis catheter was introduced. Thoracentesis was performed. The catheter was removed and a dressing applied. FINDINGS: A total of approximately 800 mL of yellow fluid was removed. IMPRESSION: Successful ultrasound guided right thoracentesis yielding 800 mL of pleural fluid. Read by: Kacie Matthews PA-C Electronically Signed   By: John  Watts M.D.   On: 01/17/2022 13:07   DG Chest Port 1 View  Result Date: 01/17/2022 CLINICAL DATA:  Status post thoracentesis. EXAM: PORTABLE CHEST 1 VIEW COMPARISON:  01/16/2022 FINDINGS: Interval decrease in right pleural effusion. No evidence for pneumothorax. Streaky opacity at the lung bases suggest atelectasis. The cardiopericardial silhouette is within normal   limits for size. Bones are demineralized. Right PICC line tip overlies the mid to distal SVC level. IMPRESSION: Interval decrease in right pleural effusion without evidence for pneumothorax. Electronically Signed   By: Eric  Mansell M.D.   On: 01/17/2022 11:00    Assessment / Plan:  1) 66 year old female admitted to the hospital  01/06/2022 with a right foot wound and right second toe ulceration concerning for osteomyelitis treated with IV antibiotics who developed progressive anemia since admission.  FOBT +. No overt rectal bleeding or melena. She passed a large soft yellow BM this morning. Admission Hg 10.9 -> 13.8 -> 10.2 -> 10 -> 9.8 -> 8.5 on 10/30 -> 10.8 -> 8.0  on 11/1 -> 6.6 on 11/5 transfused one unit of PRBCs -> Hg 8.3 -> today Hg 8.  Iron 58.  B12 level 1,325. Hemodynamically stable. -Transfuse for hemoglobin less than 7 -Continue Pantoprazole 40 mg p.o. twice daily -Monitor the patient for active GI bleeding -CBC in a.m. -NPO -EGD to survey for anastomotic ulcer/PUD in setting of NSAID scheduled today    2) History of GERD -See plan in # 1   3) Alcohol associated cirrhosis.  MELD 6. Albumin 2.2.  INR 1.4. Total bili 0.9.  Alk phos 122.  AST 26.  ALT 9.  On Albumin 25 g IV QD.  EGD at [facility] 07/2013 without varices.  RUQ sonogram today consistent with cirrhosis without hepatoma, CBD measured 13mm likely d/t past cholecystectomy, perihepatic ascites present and no splenomegaly. -INR, BMP and AFP in am -EGD to survey for esophageal varices and to rule out anastomotic ulcers with + NSAID use.  See plan in # 1.  -Complete alcohol cessation recommended   4) Right pleural effusion s/p thoracentesis per IR 10/31 (1.2 L removed) and 11/5 (800ml removed).  Cytology pending. Pleural protein < 3 with elevated pleural LDH level of 29. Chest x-ray 11/5 showed decreased right pleural effusion without evidence of a pneumothorax.  On Furosemide 40 mg po twice daily.  Echo with LVEF 70 to 75%, normal RV function.   5) Chronic pancreatitis -Continue Creon tid with each meal -Pain management per the hospitalist   6) Severe malnutrition, hypoalbuminemia. -Registered dietitian following   7) hypokalemia, hypocalcemia -Management per the hospitalist    Principal Problem:   Protein-calorie malnutrition, severe (HCC) Active  Problems:   Chronic pancreatitis (HCC)   Osteomyelitis (HCC)   Hypokalemia   Hypomagnesemia   AKI (acute kidney injury) (HCC)   Chronic pain syndrome   Cellulitis of right foot   Normocytic anemia   Gastroesophageal reflux disease without esophagitis   Pressure injury of skin   Physical deconditioning   Pleural effusion   Abdominal distention   Acute respiratory failure with hypoxia (HCC)   Hypoalbuminemia   Anasarca   Alcoholic cirrhosis of liver without ascites (HCC)   Occult blood in stools   Portal hypertension (HCC)     LOS: 12 days   Carolyn [name] M Kennedy-Smith  01/19/2022, 9:28AM    

## 2022-01-19 NOTE — H&P (View-Only) (Signed)
   [facility] Progress Note  CC:  Anemia, positive FOBT   Subjective: She is tolerating a clear liquid diet. No N/V. She is having mild lower abdominal cramping, no significant abdominal pain. She passes a moderate amount of soft yellow stool at this time. No rectal bleeding. No melena. No CP or SOB.    Objective:  Vital signs in last 24 hours: Temp:  [98.4 F (36.9 C)-99 F (37.2 C)] 98.4 F (36.9 C) (11/07 0645) Pulse Rate:  [94-106] 94 (11/07 0645) Resp:  [16-18] 17 (11/07 0645) BP: (112-143)/(72-85) 123/83 (11/07 0645) SpO2:  [93 %-100 %] 93 % (11/07 0645) Last BM Date : 01/18/22  General: Chronically ill, cachectic appearing 66 year old female in NAD. Heart: RRR, no murmur.  Pulm: Breath sounds clear throughout. Abdomen: Soft, nondistended.  Positive bowel sounds all quadrants.  No ascites.  Midline and LUQ scar intact. Extremities:  Right and left foot/ankle drsgs intact.  Neurologic:  Alert and  oriented x 4. Moves all extremities weakly. Speech is clear. Psych:  Alert and cooperative. Normal mood and affect.  Intake/Output from previous day: 11/06 0701 - 11/07 0700 In: 100 [IV Piggyback:100] Out: 2400 [Urine:2400] Intake/Output this shift: No intake/output data recorded.  Lab Results: Recent Labs    01/17/22 0259 01/18/22 0258 01/19/22 0328  WBC 3.7* 5.0 4.5  HGB 6.6* 8.3* 8.0*  HCT 20.0* 24.4* 23.8*  PLT 106* 149* 123*   BMET Recent Labs    01/17/22 0259 01/18/22 0258 01/19/22 0328  NA 136 136 135  K 3.2* 3.3* 3.4*  CL 96* 97* 97*  CO2 33* 34* 35*  GLUCOSE 106* 114* 122*  BUN 10 12 10  CREATININE 0.59 0.66 0.66  CALCIUM 7.4* 7.4* 7.3*   LFT Recent Labs    01/19/22 0328  PROT 4.0*  ALBUMIN 2.2*  AST 26  ALT 9  ALKPHOS 122  BILITOT 0.9  BILIDIR 0.3*  IBILI 0.6   PT/INR Recent Labs    01/18/22 2044  LABPROT 17.2*  INR 1.4*   Hepatitis Panel No results for input(s): "HEPBSAG", "HCVAB", "HEPAIGM", "HEPBIGM" in the  last 72 hours.  US THORACENTESIS ASP PLEURAL SPACE W/IMG GUIDE  Result Date: 01/17/2022 INDICATION: Patient with severe protein calorie malnutrition, acute respiratory failure related to right pleural effusion. Request is made for therapeutic right thoracentesis. EXAM: ULTRASOUND GUIDED RIGHT THORACENTESIS MEDICATIONS: 10 mL% lidocaine COMPLICATIONS: None immediate. PROCEDURE: An ultrasound guided thoracentesis was thoroughly discussed with the patient and questions answered. The benefits, risks, alternatives and complications were also discussed. The patient understands and wishes to proceed with the procedure. Written consent was obtained. Ultrasound was performed to localize and mark an adequate pocket of fluid in the right chest. The area was then prepped and draped in the normal sterile fashion. 1% Lidocaine was used for local anesthesia. Under ultrasound guidance a 6 Fr Safe-T-Centesis catheter was introduced. Thoracentesis was performed. The catheter was removed and a dressing applied. FINDINGS: A total of approximately 800 mL of yellow fluid was removed. IMPRESSION: Successful ultrasound guided right thoracentesis yielding 800 mL of pleural fluid. Read by: Kacie Matthews PA-C Electronically Signed   By: John  Watts M.D.   On: 01/17/2022 13:07   DG Chest Port 1 View  Result Date: 01/17/2022 CLINICAL DATA:  Status post thoracentesis. EXAM: PORTABLE CHEST 1 VIEW COMPARISON:  01/16/2022 FINDINGS: Interval decrease in right pleural effusion. No evidence for pneumothorax. Streaky opacity at the lung bases suggest atelectasis. The cardiopericardial silhouette is within normal   limits for size. Bones are demineralized. Right PICC line tip overlies the mid to distal SVC level. IMPRESSION: Interval decrease in right pleural effusion without evidence for pneumothorax. Electronically Signed   By: Misty Stanley M.D.   On: 01/17/2022 11:00    Assessment / Plan:  40) 66 year old female admitted to the hospital  01/06/2022 with a right foot wound and right second toe ulceration concerning for osteomyelitis treated with IV antibiotics who developed progressive anemia since admission.  FOBT +. No overt rectal bleeding or melena. She passed a large soft yellow BM this morning. Admission Hg 10.9 -> 13.8 -> 10.2 -> 10 -> 9.8 -> 8.5 on 10/30 -> 10.8 -> 8.0  on 11/1 -> 6.6 on 11/5 transfused one unit of PRBCs -> Hg 8.3 -> today Hg 8.  Iron 58.  B12 level 1,325. Hemodynamically stable. -Transfuse for hemoglobin less than 7 -Continue Pantoprazole 40 mg p.o. twice daily -Monitor the patient for active GI bleeding -CBC in a.m. -NPO -EGD to survey for anastomotic ulcer/PUD in setting of NSAID scheduled today    2) History of GERD -See plan in # 1   3) Alcohol associated cirrhosis.  MELD 6. Albumin 2.2.  INR 1.4. Total bili 0.9.  Alk phos 122.  AST 26.  ALT 9.  On Albumin 25 g IV QD.  EGD at [facility] 07/2013 without varices.  RUQ sonogram today consistent with cirrhosis without hepatoma, CBD measured 48m likely d/t past cholecystectomy, perihepatic ascites present and no splenomegaly. -INR, BMP and AFP in am -EGD to survey for esophageal varices and to rule out anastomotic ulcers with + NSAID use.  See plan in # 1.  -Complete alcohol cessation recommended   4) Right pleural effusion s/p thoracentesis per IR 10/31 (1.2 L removed) and 11/5 (8047mremoved).  Cytology pending. Pleural protein < 3 with elevated pleural LDH level of 29. Chest x-ray 11/5 showed decreased right pleural effusion without evidence of a pneumothorax.  On Furosemide 40 mg po twice daily.  Echo with LVEF 70 to 75%, normal RV function.   5) Chronic pancreatitis -Continue Creon tid with each meal -Pain management per the hospitalist   6) Severe malnutrition, hypoalbuminemia. -Registered dietitian following   7) hypokalemia, hypocalcemia -Management per the hospitalist    Principal Problem:   Protein-calorie malnutrition, severe (HCP[place] CedroActive  Problems:   Chronic pancreatitis (HCC)   Osteomyelitis ([facility]  Hypokalemia   Hypomagnesemia   AKI (acute kidney injury) (HC[facility]  Chronic pain syndrome   Cellulitis of right foot   Normocytic anemia   Gastroesophageal reflux disease without esophagitis   Pressure injury of skin   Physical deconditioning   Pleural effusion   Abdominal distention   Acute respiratory failure with hypoxia (HCC)   Hypoalbuminemia   Anasarca   Alcoholic cirrhosis of liver without ascites (HCFer[place]  Occult blood in stools   Portal hypertension (HC[facility]rod[place]    LOS: 12 days   CoNoralyn Pick11/09/2021, 9:28AM

## 2022-01-19 NOTE — Anesthesia Procedure Notes (Signed)
Procedure Name: MAC Date/Time: 01/19/2022 2:09 PM  Performed by: Maxwell Caul, CRNAPre-anesthesia Checklist: Patient identified, Emergency Drugs available, Suction available and Patient being monitored Oxygen Delivery Method: Simple face mask

## 2022-01-19 NOTE — Op Note (Signed)
[facility] Patient Name: Carolyn [name] Procedure Date: 01/19/2022 MRN: [id number] Attending MD: Doristine Locks , MD, [id number] Date of Birth: 1955-04-19 CSN: [id number] Age: 66 Admit Type: Inpatient Procedure:                Upper GI endoscopy Indications:              Normocytic anemia, Heme positive stool, Cirrhosis                            rule out esophageal varices Providers:                Fransisca Connors, [place]annie [place], Technician,                            Doristine Locks, MD Referring MD:              Medicines:                Monitored Anesthesia Care Complications:            No immediate complications. Estimated Blood Loss:     Estimated blood loss was minimal. Procedure:                Pre-Anesthesia Assessment:                           - Prior to the procedure, a History and Physical                            was performed, and patient medications and                            allergies were reviewed. The patient's tolerance of                            previous anesthesia was also reviewed. The risks                            and benefits of the procedure and the sedation                            options and risks were discussed with the patient.                            All questions were answered, and informed consent                            was obtained. Prior Anticoagulants: The patient has                            taken no anticoagulant or antiplatelet agents. ASA                            Grade Assessment: III - A patient with severe  systemic disease. After reviewing the risks and                            benefits, the patient was deemed in satisfactory                            condition to undergo the procedure.                           After obtaining informed consent, the endoscope was                            passed under direct vision. Throughout the                             procedure, the patient's blood pressure, pulse, and                            oxygen saturations were monitored continuously. The                            [id number]-[id number] ([id number]) Olympus endoscope was introduced                            through the mouth, and advanced to the third part                            of duodenum. The upper GI endoscopy was                            accomplished without difficulty. The patient                            tolerated the procedure well. Scope In: Scope Out: Findings:      The examined esophagus was normal. There were no esophageal varices       noted on this study.      The Z-line was regular and was found 38 cm from the incisors.      Moderate portal hypertensive gastropathy was found in the gastric       fundus, in the gastric body, and in the gastric antrum. Biopsies were       taken with a cold forceps for Helicobacter pylori testing. Estimated       blood loss was minimal.      A well-healed scar was found in the gastric body. Based on location,       apperance, and history, this is likely from a prior PEG tube.      One non-bleeding, deeply cratered gastric ulcer was found in the       prepyloric region of the stomach. The lesion was 15 mm in largest       dimension. There was an apparent self-contained fistulous tract at the       base of the ulcer which apperaed to communicate with the duodenal bulb.      The ampulla, duodenal bulb, first portion of the duodenum, second  portion of the duodenum and third portion of the duodenum were normal. Impression:               - Normal esophagus. No esophageal or gastric                            varices.                           - Z-line regular, 38 cm from the incisors.                           - Portal hypertensive gastropathy. Biopsied.                           - Scar in the gastric body. This correlates with                            the prior history of PEG tube in 2010.                            - Non-bleeding, deeply cratered gastric ulcer.                            There was an apparent self-contained fistulous                            tract at the base of the ulcer which appeared to                            communicate with the duodenal bulb, consistent with                            contained perforation. I discussed this with the                            surgical service who agrees that no further imaging                            (ie, UGI series) or surgery needed, and instead                            plan to treat with maximal medical therapy as below.                           - Normal ampulla, duodenal bulb, first portion of                            the duodenum, second portion of the duodenum and                            third portion of the duodenum.                           -  Of note, there is NO evidence of a prior                            Roux-en-Y gastric bypass. Moderate Sedation:      Not Applicable - Patient had care per Anesthesia. Recommendation:           - Return patient to hospital ward for ongoing care.                           - Clear liquid diet now, then slowly advance as                            tolerated.                           - Use Protonix (pantoprazole) 40 mg PO BID for 10                            weeks, then reduce to 40 mg daily.                           - Use sucralfate suspension 1 gram PO QID for 6                            weeks.                           - Repeat upper endoscopy in 8 weeks to check                            healing.                           - Await pathology results.                           - No NSAIDs. Procedure Code(s):        --- Professional ---                           [phone], Esophagogastroduodenoscopy, flexible,                            transoral; with biopsy, single or multiple Diagnosis Code(s):        --- Professional ---                           K76.6, Portal  hypertension                           K31.89, Other diseases of stomach and duodenum                           K25.9, Gastric ulcer, unspecified as acute or  chronic, without hemorrhage or perforation                           D50.0, Iron deficiency anemia secondary to blood                            loss (chronic)                           R19.5, Other fecal abnormalities                           K74.60, Unspecified cirrhosis of liver CPT copyright 2022 American [employer] Association. All rights reserved. The codes documented in this report are preliminary and upon coder review may  be revised to meet current compliance requirements. Doristine LocksVito Marcine Gadway, MD 01/19/2022 3:01:19 PM Number of Addenda: 0

## 2022-01-19 NOTE — TOC Progression Note (Signed)
Transition of Care [facility]) - Progression Note    Patient Details  Name: Carolyn [name] MRN: [id number] Date of Birth: 11-27-1955  [facility] CM/SW Contact  Leeroy Cha, RN Phone Number: 01/19/2022, 10:28 AM  Clinical Narrative:    Names of the pending snf's in the system given to husband with phone numbers.  He would like to call and see if any of these are available does not accept [facility] or [facility].  Will await his return call.        Expected Discharge Plan and Services                                                 Social Determinants of Health (SDOH) Interventions    Readmission Risk Interventions   No data to display

## 2022-01-19 NOTE — Anesthesia Preprocedure Evaluation (Addendum)
Anesthesia Evaluation  Patient identified by MRN, date of birth, ID band Patient awake    Reviewed: Allergy & Precautions, NPO status , Patient's Chart, lab work & pertinent test results  History of Anesthesia Complications Negative for: history of anesthetic complications  Airway Mallampati: II  TM Distance: >3 FB Neck ROM: Full    Dental  (+) Dental Advisory Given, Implants, Chipped   Pulmonary neg pulmonary ROS  Pulmonary exam normal         Cardiovascular negative cardio ROS   Rhythm:Regular Rate:Tachycardia   '23 TTE - EF 70 to 75%. There is mild left ventricular hypertrophy of the basal-septal segment. Grade I diastolic dysfunction (impaired relaxation). Trivial mitral valve regurgitation. Aortic valve regurgitation is trivial.      Neuro/Psych Seizures -, Well Controlled,  negative psych ROS   GI/Hepatic GERD  Medicated and Controlled,(+) Cirrhosis     substance abuse  alcohol use,  S/p gastric bypass Chronic pancreatitis    Endo/Other  negative endocrine ROS   Ca 7.3   Renal/GU negative Renal ROS     Musculoskeletal negative musculoskeletal ROS (+)   Chronic pain    Abdominal   Peds  Hematology  (+) Blood dyscrasia, anemia ,  Plt 123k    Anesthesia Other Findings   Reproductive/Obstetrics                            Anesthesia Physical Anesthesia Plan  ASA: 3  Anesthesia Plan: MAC   Post-op Pain Management:    Induction:   PONV Risk Score and Plan: 2 and Propofol infusion and Treatment may vary due to age or medical condition  Airway Management Planned: Nasal Cannula and Natural Airway  Additional Equipment: None  Intra-op Plan:   Post-operative Plan:   I have reviewed the patients History and Physical, chart, labs and discussed the procedure including the risks, benefits and alternatives for the proposed anesthesia with the patient or authorized  representative who has indicated his/her understanding and acceptance.     Informed Consent:   Plan Discussed with: CRNA and Anesthesiologist  Anesthesia Plan Comments:       Anesthesia Quick Evaluation

## 2022-01-19 NOTE — Progress Notes (Signed)
Occupational Therapy Treatment Patient Details Name: Carolyn [name] MRN: [id number] DOB: 09/10/1955 Today's Date: 01/19/2022   History of present illness 66 y.o. female nondiabetic, not a smoker, malnourished, presenting for bilateral foot cellulitis/with ulcers.  Most concerning the right second toe.  X-ray raises concern for cellulitis and osteomyelitis but negative on more sensitive MRI.  Podiatry consulted. PMH: ETOH use, cirrhosis, liver disease, malabsorption syndrome, pancreatitis, Szs   OT comments  Patient was motivated towards therapy and she is demonstrating gradual functional progression. She required assistance for donning her post-op shoes seated EOB and min assist to stand using a RW. She ambulated in her room using a RW and was assisted to the bedside chair. She reported having 5/10 distal B LE pain, as well as some feelings of generalized weakness. Continue OT plan of care.    Recommendations for follow up therapy are one component of a multi-disciplinary discharge planning process, led by the attending physician.  Recommendations may be updated based on patient status, additional functional criteria and insurance authorization.    Follow Up Recommendations  Skilled nursing-short term rehab (<3 hours/day)    Assistance Recommended at Discharge Intermittent Supervision/Assistance  Patient can return home with the following  A little help with walking and/or transfers;Assistance with cooking/housework;Assist for transportation;A lot of help with bathing/dressing/bathroom   Equipment Recommendations  Other (comment) (TBD pending progress at next setting)       Precautions / Restrictions Precautions Precautions: Fall Other Brace: bilateral "post op" shoes Restrictions Weight Bearing Restrictions: No Other Position/Activity Restrictions: WBAT per podiatry progress note       Mobility Bed Mobility Overal bed mobility: Needs Assistance Bed Mobility: Supine to Sit      Supine to sit: Supervision, HOB elevated          Transfers Overall transfer level: Needs assistance Equipment used: Rolling walker (2 wheels) Transfers: Sit to/from Stand Sit to Stand: Min assist                     ADL either performed or assessed with clinical judgement   ADL Overall ADL's : Needs assistance/impaired Eating/Feeding: Independent   Grooming: Set up;Sitting           Upper Body Dressing : Set up;Sitting   Lower Body Dressing: Moderate assistance Lower Body Dressing Details (indicate cue type and reason): She required assist to don her post-op shoes seated EOB.                      Cognition Arousal/Alertness: Awake/alert Behavior During Therapy: WFL for tasks assessed/performed Overall Cognitive Status: Within Functional Limits for tasks assessed                        Pertinent Vitals/ Pain       Pain Assessment Pain Assessment: 0-10 Pain Score: 5  Pain Location: feet and lower legs Pain Intervention(s): Limited activity within patient's tolerance         Frequency  Min 2X/week        Progress Toward Goals  OT Goals(current goals can now be found in the care plan section)  Progress towards OT goals: Progressing toward goals  Acute Rehab OT Goals OT Goal Formulation: With patient Time For Goal Achievement: 01/23/22 Potential to Achieve Goals: Good  Plan Discharge plan remains appropriate       AM-PAC OT "6 Clicks" Daily Activity     Outcome Measure   Help from another  person eating meals?: None Help from another person taking care of personal grooming?: A Little Help from another person toileting, which includes using toliet, bedpan, or urinal?: A Lot Help from another person bathing (including washing, rinsing, drying)?: A Lot Help from another person to put on and taking off regular upper body clothing?: None Help from another person to put on and taking off regular lower body clothing?: A Lot 6  Click Score: 17    End of Session Equipment Utilized During Treatment: Rolling walker (2 wheels)  OT Visit Diagnosis: Pain;Muscle weakness (generalized) (M62.81);Unsteadiness on feet (R26.81)   Activity Tolerance Patient tolerated treatment well   Patient Left with call bell/phone within reach;in chair   Nurse Communication Mobility status        Time: 5188-4166 OT Time Calculation (min): 32 min  Charges: OT General Charges $OT Visit: 1 Visit OT Treatments $Therapeutic Activity: 23-37 mins     Leota Sauers, OTR/L 01/19/2022, 10:19 AM

## 2022-01-20 ENCOUNTER — Encounter (HOSPITAL_COMMUNITY): Payer: Self-pay | Admitting: Gastroenterology

## 2022-01-20 DIAGNOSIS — E43 Unspecified severe protein-calorie malnutrition: Secondary | ICD-10-CM | POA: Diagnosis not present

## 2022-01-20 DIAGNOSIS — L03115 Cellulitis of right lower limb: Secondary | ICD-10-CM | POA: Diagnosis not present

## 2022-01-20 DIAGNOSIS — J948 Other specified pleural conditions: Secondary | ICD-10-CM

## 2022-01-20 DIAGNOSIS — K255 Chronic or unspecified gastric ulcer with perforation: Secondary | ICD-10-CM

## 2022-01-20 DIAGNOSIS — R195 Other fecal abnormalities: Secondary | ICD-10-CM

## 2022-01-20 DIAGNOSIS — K7030 Alcoholic cirrhosis of liver without ascites: Secondary | ICD-10-CM

## 2022-01-20 DIAGNOSIS — R601 Generalized edema: Secondary | ICD-10-CM | POA: Diagnosis not present

## 2022-01-20 LAB — BASIC METABOLIC PANEL
Anion gap: 5 (ref 5–15)
BUN: 10 mg/dL (ref 8–23)
CO2: 32 mmol/L (ref 22–32)
Calcium: 7.5 mg/dL — ABNORMAL LOW (ref 8.9–10.3)
Chloride: 97 mmol/L — ABNORMAL LOW (ref 98–111)
Creatinine, Ser: 0.59 mg/dL (ref 0.44–1.00)
GFR, Estimated: >60 mL/min (ref >60)
Glucose, Bld: 168 mg/dL — ABNORMAL HIGH (ref 70–99)
Potassium: 3.9 mmol/L (ref 3.5–5.1)
Sodium: 134 mmol/L — ABNORMAL LOW (ref 135–145)

## 2022-01-20 LAB — CBC
HCT: 25.3 % — ABNORMAL LOW (ref 36.0–46.0)
Hemoglobin: 8.1 g/dL — ABNORMAL LOW (ref 12.0–15.0)
MCH: 30.5 pg (ref 26.0–34.0)
MCHC: 32 g/dL (ref 30.0–36.0)
MCV: 95.1 fL (ref 80.0–100.0)
Platelets: 133 10*3/uL — ABNORMAL LOW (ref 150–400)
RBC: 2.66 MIL/uL — ABNORMAL LOW (ref 3.87–5.11)
RDW: 16.8 % — ABNORMAL HIGH (ref 11.5–15.5)
WBC: 4.7 10*3/uL (ref 4.0–10.5)
nRBC: 0 % (ref 0.0–0.2)

## 2022-01-20 LAB — MAGNESIUM: Magnesium: 1.6 mg/dL — ABNORMAL LOW (ref 1.7–2.4)

## 2022-01-20 LAB — SURGICAL PATHOLOGY

## 2022-01-20 MED ORDER — MAGNESIUM SULFATE 2 GM/50ML IV SOLN
2.0000 g | Freq: Once | INTRAVENOUS | Status: AC
  Administered 2022-01-20: 2 g via INTRAVENOUS
  Filled 2022-01-20: qty 50

## 2022-01-20 NOTE — Progress Notes (Signed)
PROGRESS NOTE  HENRETTER PIEKARSKI  LKG:[id number] DOB: 1955/10/13 DOA: 01/06/2022 PCP: Farris Has, MD   Brief Narrative: 66 year old female with history of chronic alcohol pancreatitis, chronic pain syndrome, GERD, severe malnutrition, osteoporosis who presented initially with right foot wound that started as a blister/bulla.  Right foot x-ray raised concern for acute osteomyelitis but was negative for MRI. Evaluated by podiatry who recommended antibiotics for 7 days, wound care and outpatient follow-up.  Patient developed abdominal fullness for which KUB was done which showed a pleural effusion.  Chest x-ray confirmed large right pleural effusion.  IR did thoracentesis  x2 . Palliative care consulted for goals of care who recommended outpatient follow-up at a skilled nursing facility. GI consulted for drop of hemoglobin with positive FOBT. PT/OT recommending SNF on discharge.     Assessment & Plan:  Principal Problem:   Protein-calorie malnutrition, severe (HCC) Active Problems:   Chronic pancreatitis (HCC)   Osteomyelitis (HCC)   Hypokalemia   Hypomagnesemia   AKI (acute kidney injury) (HCC)   Chronic pain syndrome   Cellulitis of right foot   Normocytic anemia   Gastroesophageal reflux disease without esophagitis   Pressure injury of skin   Physical deconditioning   Pleural effusion   Abdominal distention   Acute respiratory failure with hypoxia (HCC)   Hypoalbuminemia   Anasarca   Alcoholic cirrhosis of liver without ascites (HCC)   Occult blood in stools   Portal hypertensive gastropathy (HCC)   Abnormal CT of the abdomen   Chronic gastric ulcer with perforation (HCC)   Acute hypoxic respiratory failure Improved Secondary to right pleural effusion, atelectasis Chest x-ray showed large pleural effusion.  BNP was also elevated Echocardiogram showed EF of 70 to 75%, grade 1 diastolic dysfunction Albumin was very low. S/P thora  x 2 S/P IV Lasix and IV albumin Continue  p.o. Lasix Supplemental O2 as needed currently on room air  Normocytic anemia Acute blood loss anemia Hemoglobin dropped to the range of 6, currently stable Anemia panel suggests anemia of chronic disease Positive  FOBT.  She was transfused with a unit of PRBC on 11/5 Consulted, s/p EGD on 11/7, which showed nonbleeding deeply cratered gastric ulcer, noted self-contained fistulous tract at the base of the ulcer which appeared to communicate with the duodenal bulb consistent with contained perforation General surgery was consulted, recommended no further management/imaging needed, recommend medical management Stomach biopsy showed minimal chronic gastritis with reactive epithelial changes negative for H. pylori, interstitial metaplasia or carcinoma Full liquid diet Pantoprazole 40 mg p.o. twice daily for 10 weeks then switch to 40 mg daily, sucralfate 1 g p.o. 4 times daily for 6 weeks Recommend repeat endoscopy in 8 weeks to evaluate ulcer for healing  Anasarca/hypoalbuminemia Noted dependent edema.  Likely nutritional.  Nutritionist consulted and following  Bilateral feet cellulitis/ulceration X-ray of right foot raise concern for cellulitis but osteomyelitis ruled out by MRI No plan for surgery, podiatry recommended 7 days course of antibiotics, wound care.  She  completed ancef on 10/4 Continue pain management, supportive care  Abdominal cramps: Continue Bentyl  Low magnesium/potassium/phosphorus: Continue to monitor and supplement as needed.    Chronic alcohol use associated cirrhosis Pancreatitis/pancreatic insufficiency/chronic pain syndrome: Sober over a year.  Continue home Butrans, Creon  Vitamin D/A insufficiency: Being supplemented  Physical deconditioning/ambulatory dysfunction: PT/OT recommended SNF  Severe protein calorie malnutrition: nutrition is consulted.  BMI of 16.7.  This is likely associated with chronic pancreatitis, multiple abdominal surgeries  Goals of  care:  Palliative care also consulted here.  Recommend outpatient follow-up at the SNF  Pressure Injury 01/07/22 Coccyx Mid Stage 1 -  Intact skin with non-blanchable redness of a localized area usually over a bony prominence. (Active)  01/07/22 1848  Location: Coccyx  Location Orientation: Mid  Staging: Stage 1 -  Intact skin with non-blanchable redness of a localized area usually over a bony prominence.  Wound Description (Comments):   Present on Admission: Yes      Nutrition Problem: Severe Malnutrition Etiology: chronic illness (chronic pancreatitis, h/o multiple gastric surgeries including gastric bypass) Pressure Injury 01/07/22 Coccyx Mid Stage 1 -  Intact skin with non-blanchable redness of a localized area usually over a bony prominence. (Active)  01/07/22 1848  Location: Coccyx  Location Orientation: Mid  Staging: Stage 1 -  Intact skin with non-blanchable redness of a localized area usually over a bony prominence.  Wound Description (Comments):   Present on Admission: Yes  Dressing Type Foam - Lift dressing to assess site every shift 01/20/22 0900    DVT prophylaxis:     Code Status: Full Code  Family Communication: None at bedside  Patient status:Inpatient  Patient is from :Home  Anticipated discharge to:SNF  Estimated DC date:TBD   Consultants: Palliative care, GI  Procedures:Thora  x2 , paracentesis   Antimicrobials:  Anti-infectives (From admission, onward)    Start     Dose/Rate Route Frequency Ordered Stop   01/09/22 1800  ceFAZolin (ANCEF) IVPB 1 g/50 mL premix        1 g 100 mL/hr over 30 Minutes Intravenous Every 8 hours 01/09/22 1513 01/16/22 1759   01/06/22 2030  cefTRIAXone (ROCEPHIN) 2 g in sodium chloride 0.9 % 100 mL IVPB  Status:  Discontinued       See Hyperspace for full Linked Orders Report.   2 g 200 mL/hr over 30 Minutes Intravenous Every 24 hours 01/06/22 2017 01/09/22 1513   01/06/22 2030  metroNIDAZOLE (FLAGYL) IVPB 500 mg   Status:  Discontinued       See Hyperspace for full Linked Orders Report.   500 mg 100 mL/hr over 60 Minutes Intravenous Every 12 hours 01/06/22 2017 01/09/22 1513       Subjective: Patient denies any new complaints, denies any rectal bleeding.  Denies any worsening abdominal pain.   Objective: Vitals:   01/19/22 1510 01/19/22 2031 01/20/22 0549 01/20/22 1234  BP: 127/84 127/78 121/80 98/68  Pulse: 100 95 90 88  Resp: 15 17 16 15   Temp:  98.4 F (36.9 C) 97.9 F (36.6 C) 98.2 F (36.8 C)  TempSrc:  Oral Oral Oral  SpO2: 97% 100% 98% 98%  Weight:      Height:        Intake/Output Summary (Last 24 hours) at 01/20/2022 1714 Last data filed at 01/20/2022 1237 Gross per 24 hour  Intake 540 ml  Output 400 ml  Net 140 ml   Filed Weights   01/06/22 1857  Weight: 49.9 kg    Examination: General: NAD, condition, chronically ill-appearing, cachectic Cardiovascular: S1, S2 present Respiratory: CTAB Abdomen: Soft, nontender, nondistended, bowel sounds present Musculoskeletal: No bilateral pedal edema noted Skin: Normal Psychiatry: Fair mood    Data Reviewed: I have personally reviewed following labs and imaging studies  CBC: Recent Labs  Lab 01/17/22 0259 01/18/22 0258 01/19/22 0328 01/20/22 0323  WBC 3.7* 5.0 4.5 4.7  HGB 6.6* 8.3* 8.0* 8.1*  HCT 20.0* 24.4* 23.8* 25.3*  MCV 95.2 91.4 93.0 95.1  PLT  106* 149* 123* 133*   Basic Metabolic Panel: Recent Labs  Lab 01/14/22 0238 01/15/22 0450 01/16/22 0410 01/17/22 0259 01/18/22 0258 01/19/22 0328 01/20/22 0323  NA 133* 135 134* 136 136 135 134*  K 3.4* 3.4* 3.7 3.2* 3.3* 3.4* 3.9  CL 99 98 97* 96* 97* 97* 97*  CO2 28 31 31  33* 34* 35* 32  GLUCOSE 134* 167* 108* 106* 114* 122* 168*  BUN 9 9 10 10 12 10 10   CREATININE 0.66 0.57 0.53 0.59 0.66 0.66 0.59  CALCIUM 7.4* 7.0* 7.4* 7.4* 7.4* 7.3* 7.5*  MG 1.6* 1.8 1.5*  --  1.6* 1.5* 1.6*  PHOS 1.9* 2.5 2.9  --  3.4 2.9  --      Recent Results (from the  past 240 hour(s))  Body fluid culture w Gram Stain     Status: None   Collection Time: 01/12/22 12:30 PM   Specimen: Peritoneal Washings  Result Value Ref Range Status   Specimen Description   Final    PERITONEAL Performed at [facility], [address] [place], [place]    Special Requests   Final    NONE Performed at [facility], [address] [place], [place]    Gram Stain   Final    RARE WBC PRESENT,BOTH PMN AND MONONUCLEAR NO ORGANISMS SEEN    Culture   Final    NO GROWTH 3 DAYS Performed at [facility], [address] [place], [address] [place]    Report Status 01/16/2022 FINAL  Final     Radiology Studies: [id number] Abdomen Complete  Result Date: 01/19/2022 CLINICAL DATA:  Cirrhosis. EXAM: ABDOMEN ULTRASOUND COMPLETE COMPARISON:  05/21/2013 FINDINGS: Gallbladder: Surgically absent. Common bile duct: Diameter: 13 mm in the hepatoduodenal ligament. Liver: Nodular liver contour suggests cirrhosis. No focal mass lesion evident. Portal vein is patent on color Doppler imaging with normal direction of blood flow towards the liver. IVC: No abnormality visualized. Pancreas: Partially obscured by bowel gas. No evidence for main duct dilatation. Spleen: Size and appearance within normal limits. Right Kidney: Length: 9.4 cm prominent right renal pelvis, similar to prior. Echogenicity within normal limits. No mass or hydronephrosis visualized. Left Kidney: Length: 10.6 cm. Echogenicity within normal limits. No mass or hydronephrosis visualized. Abdominal aorta: No aneurysm visualized. Other findings: Free fluid is seen around the liver with evidence of right pleural effusion. IMPRESSION: 1. Nodular liver contour suggests cirrhosis. No focal mass lesion identified. 2. Common bile duct measures 13 mm in the hepatoduodenal ligament. This may be related to prior cholecystectomy but correlation with liver function test recommended. 3.  Perihepatic ascites. 4. Right pleural effusion. Electronically Signed   By: 13/09/2021 M.D.   On: 01/19/2022 08:53    Scheduled Meds:  ascorbic acid  500 mg Oral BID   buprenorphine  2 patch Transdermal Weekly   Chlorhexidine Gluconate Cloth  6 each Topical Daily   docusate sodium  100 mg Oral BID   feeding supplement  237 mL Oral TID BM   furosemide  40 mg Oral Daily   lipase/protease/amylase  24,000 Units Oral TID WC   multivitamin with minerals  1 tablet Oral Daily   nortriptyline  30 mg Oral QHS   pantoprazole  40 mg Oral BID   potassium chloride  40 mEq Oral BID   silver sulfADIAZINE   Topical BID   sodium chloride flush  10-40 mL Intracatheter Q12H   sucralfate  1 g  Oral TID WC & HS   vitamin A  50,000 Units Oral Daily   Vitamin D (Ergocalciferol)  50,000 Units Oral Q7 days   zinc sulfate  220 mg Oral Daily   Continuous Infusions:     LOS: 13 days   Briant Cedar, MD [facility] P11/10/2021, 5:14 PM

## 2022-01-20 NOTE — Progress Notes (Signed)
Mobility Specialist - Progress Note   01/20/22 1355  Mobility  Activity Ambulated with assistance in hallway  Level of Assistance Minimal assist, patient does 75% or more  Assistive Device Front wheel walker  Distance Ambulated (ft) 175 ft  Activity Response Tolerated well  Mobility Referral Yes  $Mobility charge 1 Mobility   Pt received in bed and agreeable to mobility. Even though pt did have PT earlier, pt was still eager & motivated to ambulate.  Pt is MinA from sit-to-stand & contact guard during ambulation. C/o  generalized pain they rated 5/10 as well. Pt to bed after session with all needs met.    [facility]

## 2022-01-20 NOTE — Progress Notes (Signed)
Physical Therapy Treatment Patient Details Name: Carolyn [name] MRN: [id number] DOB: 16-Mar-1955 Today's Date: 01/20/2022   History of Present Illness 66 y.o. female nondiabetic, not a smoker, malnourished, presenting for bilateral foot cellulitis/with ulcers.  Most concerning the right second toe.  X-ray raises concern for cellulitis and osteomyelitis but negative on more sensitive MRI.  Podiatry consulted.  Right pleural effusion s/p thoracentesis per IR 10/31 (1.2 L removed) and 11/5 (821ml removed). Upper GI endoscopy 11/7. PMH: ETOH use, cirrhosis, liver disease, malabsorption syndrome, pancreatitis, Szs    PT Comments    Pt progressing well with therapy, motivated to mobilize despite pain. Pt needing increased time to come to sitting EOB, min A to power up from elevated bed, cues for hand placement, BOS and LE engagement to power up to standing. Pt ambulates with RW, very slow, cautious steps needing min A initially, progressing with distance to step through pattern and min guard. Pt maintains narrow BOS with trunk slightly flexed forward and able to continue conversation without dyspnea. Once returned to room, pt fatigued and needing mod A to lift BLE back into bed.   Recommendations for follow up therapy are one component of a multi-disciplinary discharge planning process, led by the attending physician.  Recommendations may be updated based on patient status, additional functional criteria and insurance authorization.  Follow Up Recommendations  Skilled nursing-short term rehab (<3 hours/day) Can patient physically be transported by private vehicle: Yes   Assistance Recommended at Discharge Frequent or constant Supervision/Assistance  Patient can return home with the following A little help with walking and/or transfers;A little help with bathing/dressing/bathroom;Assistance with cooking/housework;Assist for transportation;Help with stairs or ramp for entrance   Equipment  Recommendations  Other (comment) (next venue)    Recommendations for Other Services       Precautions / Restrictions Precautions Precautions: Fall Other Brace: bilateral "post op" shoes Restrictions Other Position/Activity Restrictions: WBAT per podiatry progress note     Mobility  Bed Mobility Overal bed mobility: Needs Assistance Bed Mobility: Supine to Sit, Sit to Supine  Supine to sit: Supervision Sit to supine: Mod assist  General bed mobility comments: supv to come to sitting with increasead time and use of bedrail; mod A to lift BLE Back into bed    Transfers Overall transfer level: Needs assistance Equipment used: Rolling walker (2 wheels) Transfers: Sit to/from Stand Sit to Stand: Min assist, From elevated surface  General transfer comment: cues for hand placement, min A to power to stand from elevated bed    Ambulation/Gait Ambulation/Gait assistance: Min guard, Min assist Gait Distance (Feet): 150 Feet Assistive device: Rolling walker (2 wheels) Gait Pattern/deviations: Step-through pattern, Decreased stride length, Trunk flexed, Narrow base of support Gait velocity: decreased  General Gait Details: step through gait pattern with RW, slow steady gait with narrow BOS, increasead time with turns, min A initially progressing to min guard   Stairs             Wheelchair Mobility    Modified Rankin (Stroke Patients Only)       Balance Overall balance assessment: History of Falls, Needs assistance  Standing balance support: Bilateral upper extremity supported, Reliant on assistive device for balance, During functional activity Standing balance-Leahy Scale: Poor       Cognition Arousal/Alertness: Awake/alert Behavior During Therapy: WFL for tasks assessed/performed Overall Cognitive Status: Within Functional Limits for tasks assessed     Exercises      General Comments General comments (skin integrity, edema, etc.): HR  121 max noted during  mobility      Pertinent Vitals/Pain Pain Assessment Pain Assessment: Faces Faces Pain Scale: Hurts little more Pain Location: feet and lower legs Pain Descriptors / Indicators: Discomfort, Sore Pain Intervention(s): Limited activity within patient's tolerance, Monitored during session    Home Living                          Prior Function            PT Goals (current goals can now be found in the care plan section) Acute Rehab PT Goals Patient Stated Goal: get stronger,have less pain PT Goal Formulation: With patient Time For Goal Achievement: 01/22/22 Potential to Achieve Goals: Good Progress towards PT goals: Progressing toward goals    Frequency    Min 2X/week      PT Plan Current plan remains appropriate    Co-evaluation              AM-PAC PT "6 Clicks" Mobility   Outcome Measure  Help needed turning from your back to your side while in a flat bed without using bedrails?: A Little Help needed moving from lying on your back to sitting on the side of a flat bed without using bedrails?: A Little Help needed moving to and from a bed to a chair (including a wheelchair)?: A Little Help needed standing up from a chair using your arms (e.g., wheelchair or bedside chair)?: A Little Help needed to walk in hospital room?: A Little Help needed climbing 3-5 steps with a railing? : A Lot 6 Click Score: 17    End of Session Equipment Utilized During Treatment: Gait belt Activity Tolerance: Patient tolerated treatment well Patient left: in bed;with call bell/phone within reach;with bed alarm set Nurse Communication: Mobility status PT Visit Diagnosis: Other abnormalities of gait and mobility (R26.89)     Time: 2725-3664 PT Time Calculation (min) (ACUTE ONLY): 27 min  Charges:  $Gait Training: 8-22 mins $Therapeutic Activity: 8-22 mins                      Tori Tashanna Dolin PT, DPT 01/20/22, 1:40 PM

## 2022-01-20 NOTE — Progress Notes (Signed)
Nutrition Follow-up  DOCUMENTATION CODES:   Severe malnutrition in context of chronic illness, Underweight  INTERVENTION:   -Ensure Plus High Protein po BID, each supplement provides 350 kcal and 20 grams of protein.    To aid in wound healing: -500 mg Vitamin C BID -220 mg Zinc sulfate daily x 14 days (expected completion 11/10)   -Multivitamin with minerals daily   Vitamin A supplementation: -100,000 units for 3 days (completed 11/3) -Followed by 50,000 units for 14 days (expected completion 11/18) -Recommend 5,000 units daily for maintenance   -Placed order for updated weight  NUTRITION DIAGNOSIS:   Severe Malnutrition related to chronic illness (chronic pancreatitis, h/o multiple gastric surgeries including gastric bypass) as evidenced by severe fat depletion, severe muscle depletion, energy intake < or equal to 75% for > or equal to 1 month.  Ongoing.  GOAL:   Patient will meet greater than or equal to 90% of their needs  Progressing.  MONITOR:   PO intake, Supplement acceptance, Weight trends, Labs, I & O's, Skin  ASSESSMENT:   66 y.o. female with medical history significant of alcoholic cirrhosis, chronic pancreatitis, chronic pain, GERD. Presenting with a right foot wound.  Patient was on clears and now die advanced to full liquids. Had EGD 11/7, GI recommending advance diet as tolerated. Pt was NPO 11/7 for procedure.  Pt is consuming all protein and vitamin supplements. Continues Zinc supplementation until 11/10.   Admission weight: 110 lbs Needs updated weight for admission.  Medications: Vitamin C, Colace, Lasix, Creon, Multivitamin with minerals daily, KLOR-CON, Carafate, Vitamin A capsule, Vitamin D weekly, Zinc sulfate, IV Mg sulfate, zofran   Labs reviewed: Low Na Low Mg   Diet Order:   Diet Order             Diet full liquid Room service appropriate? Yes; Fluid consistency: Thin  Diet effective now           Diet general                    EDUCATION NEEDS:   Education needs have been addressed  Skin:  Skin Assessment: Skin Integrity Issues: Skin Integrity Issues:: Stage I, Other (Comment) Stage I: mid coccyx Other: non-pressure wounds: right ankle, right toe, bottom of right foot, burn on hands  Last BM:  11/7 -type 2  Height:   Ht Readings from Last 1 Encounters:  01/06/22 5\' 8"  (1.727 m)    Weight:   Wt Readings from Last 1 Encounters:  01/06/22 49.9 kg    BMI:  Body mass index is 16.73 kg/m.  Estimated Nutritional Needs:   Kcal:  1900-2100  Protein:  95-105g  Fluid:  2.1L/day  01/08/22, MS, RD, LDN Inpatient Clinical Dietitian Contact information available via Amion

## 2022-01-20 NOTE — Progress Notes (Addendum)
[facility] Progress Note  CC:   Anemia, positive FOBT     Subjective: She is having less abdominal pain today.  No N/V. She passed a small yellow BM earlier this morning. No rectal bleeding or black stools.   Objective:   EGD 01/19/2022: - Normal esophagus. No esophageal or gastric varices. - Z-line regular, 38 cm from the incisors. - Portal hypertensive gastropathy. Biopsied. - Scar in the gastric body. This correlates with the prior history of PEG tube in 2010. - Non-bleeding, deeply cratered gastric ulcer. There was an apparent self-contained fistulous tract at the base of the ulcer which appeared to communicate with the duodenal bulb, consistent with contained perforation. I discussed this with the surgical service who agrees that no further imaging (ie, UGI series) or surgery needed, and instead plan to treat with maximal medical therapy as below. - Normal ampulla, duodenal bulb, first portion of the duodenum, second portion of the duodenum and third portion of the duodenum. - Of note, there is NO evidence of a prior Roux-en-Y gastric bypass.  Vital signs in last 24 hours: Temp:  [96.7 F (35.9 C)-98.4 F (36.9 C)] 97.9 F (36.6 C) (11/08 0549) Pulse Rate:  [90-103] 90 (11/08 0549) Resp:  [10-17] 16 (11/08 0549) BP: (100-140)/(66-89) 121/80 (11/08 0549) SpO2:  [97 %-100 %] 98 % (11/08 0549) Last BM Date : 01/19/22 General: Chronically ill, cachectic appearing 66 year old female in NAD. Heart: RRR, no murmurs.  Pulm: Breath sounds clear, decreased in the bases. Abdomen: Nondistended, soft with mild epigastric tenderness without rebound or guarding. + BS x 4 quadrants. No ascites. Midline abdominal scar intact.  Extremities:  Right and left foot/ankle drsgs intact.  Neurologic:  Alert and  oriented x 4. Moves all extremities.  Psych:  Alert and cooperative.   Intake/Output from previous day: 11/07 0701 - 11/08 0700 In: 220 [I.V.:220] Out: 1100  [Urine:1100] Intake/Output this shift: No intake/output data recorded.  Lab Results: Recent Labs    01/18/22 0258 01/19/22 0328 01/20/22 0323  WBC 5.0 4.5 4.7  HGB 8.3* 8.0* 8.1*  HCT 24.4* 23.8* 25.3*  PLT 149* 123* 133*   BMET Recent Labs    01/18/22 0258 01/19/22 0328 01/20/22 0323  NA 136 135 134*  K 3.3* 3.4* 3.9  CL 97* 97* 97*  CO2 34* 35* 32  GLUCOSE 114* 122* 168*  BUN _0 CREATININE 0.66 0.66 0.59  CALCIUM 7.4* 7.3* 7.5*   LFT Recent Labs    01/19/22 0328  PROT 4.0*  ALBUMIN 2.2*  AST 26  ALT 9  ALKPHOS 122  BILITOT 0.9  BILIDIR 0.3*  IBILI 0.6   PT/INR Recent Labs    01/18/22 2044  LABPROT 17.2*  INR 1.4*   Hepatitis Panel No results for input(s): "HEPBSAG", "HCVAB", "HEPAIGM", "HEPBIGM" in the last 72 hours.  US Abdomen Complete  Result Date: 01/19/2022 CLINICAL DATA:  Cirrhosis. EXAM: ABDOMEN ULTRASOUND COMPLETE COMPARISON:  05/21/2013 FINDINGS: Gallbladder: Surgically absent. Common bile duct: Diameter: 13 mm in the hepatoduodenal ligament. Liver: Nodular liver contour suggests cirrhosis. No focal mass lesion evident. Portal vein is patent on color Doppler imaging with normal direction of blood flow towards the liver. IVC: No abnormality visualized. Pancreas: Partially obscured by bowel gas. No evidence for main duct dilatation. Spleen: Size and appearance within normal limits. Right Kidney: Length: 9.4 cm prominent right renal pelvis, similar to prior. Echogenicity within normal limits. No mass or hydronephrosis visualized. Left Kidney: Length: 10.6  cm. Echogenicity within normal limits. No mass or hydronephrosis visualized. Abdominal aorta: No aneurysm visualized. Other findings: Free fluid is seen around the liver with evidence of right pleural effusion. IMPRESSION: 1. Nodular liver contour suggests cirrhosis. No focal mass lesion identified. 2. Common bile duct measures 13 mm in the hepatoduodenal ligament. This may be related to prior  cholecystectomy but correlation with liver function test recommended. 3. Perihepatic ascites. 4. Right pleural effusion. Electronically Signed   By: Misty Stanley M.D.   On: 01/19/2022 08:53    Assessment / Plan:  61) 66 year old female admitted to the hospital 01/06/2022 with a right foot wound and right second toe ulceration concerning for osteomyelitis treated with IV antibiotics who developed progressive anemia since admission.  FOBT +. No overt rectal bleeding or melena. Admission Hg 10.9 -> 13.8 -> 10.2 -> 10 -> 9.8 -> 8.5 on 10/30 -> 10.8 -> 8.0 on 11/1 -> 6.6 on 11/5 transfused one unit of PRBCs -> Hg 8.3 -> 8 -> today Hg 8.1.  Iron 58.  B12 level 1,325. EGD 11/7 showed a nonbleeding deep cratered gastric ulcer with a self-contained fistulous track at the base of the ulcer which appeared to communicate to the duodenal bulb consistent with a contained perforation.  Her case was discussed with general surgery who recommended medical management without surgical intervention. Of note, there was no evidence of a prior Roux-en-Y gastric bypass surgery but there was a scar in the gastric body consistent with a past peg tube.  - Full liquid diet - Use Protonix (Pantoprazole) 40 mg PO BID for 10 weeks, then reduce to 40 mg daily. - Use sucralfate suspension 1 gram PO QID for 6 weeks. - Repeat upper endoscopy with Dr. Tarri Glenn in 8 weeks to check for ulcer healing. - Await pathology results.ADDENDUM: Stomach biopsy results showed minimal chronic gastritis with reactive epithelial changes negative for H. pylori, intestinal metaplasia, dysplasia and carcinoma which I discussed with the patient. - No NSAIDs. -Transfuse for hemoglobin less than 7 - CBC in am   2) History of GERD -See plan in #1   3) Alcohol associated cirrhosis.  MELD 6. Albumin 2.2.  INR 1.4. Total bili 0.9.  Alk phos 122.  AST 26.  ALT 9.  AFP pending.  On Albumin 25 g IV QD.  Portal gastropathy present but no esophageal or gastric  varices per EGD 01/19/2022. RUQ sono consistent with cirrhosis without hepatoma, CBD measured 77m likely d/t past cholecystectomy, perihepatic ascites present without splenomegaly. -Complete alcohol cessation recommended -Out patient follow up with Dr. BTarri Glenn   4) Right pleural effusion s/p thoracentesis per IR 10/31 (1.2 L removed) and 11/5 (8036mremoved).  Cytology pending. Pleural protein < 3 with elevated pleural LDH level of 29. Chest x-ray 11/5 showed decreased right pleural effusion without evidence of a pneumothorax.  On Furosemide 40 mg po twice daily.  Echo with LVEF 70 to 75%, normal RV function.   5) Chronic pancreatitis -Continue Creon tid with each meal -Pain management per the hospitalist   6) Severe malnutrition, hypoalbuminemia.  -Ensure Plus High Protein po BID, each supplement provides 350 kcal and 20 grams of protein per RD    Principal Problem:   Protein-calorie malnutrition, severe (HCC) Active Problems:   Chronic pancreatitis (HCC)   Osteomyelitis (HCC)   Hypokalemia   Hypomagnesemia   AKI (acute kidney injury) (HCC)   Chronic pain syndrome   Cellulitis of right foot   Normocytic anemia   Gastroesophageal  reflux disease without esophagitis   Pressure injury of skin   Physical deconditioning   Pleural effusion   Abdominal distention   Acute respiratory failure with hypoxia (HCC)   Hypoalbuminemia   Anasarca   Alcoholic cirrhosis of liver without ascites (HCC)   Occult blood in stools   Portal hypertensive gastropathy (HCC)   Abnormal CT of the abdomen   Chronic gastric ulcer with perforation ([place])     LOS: 13 days   Noralyn Pick  01/20/2022, 11:51AM

## 2022-01-21 ENCOUNTER — Telehealth: Payer: Self-pay

## 2022-01-21 ENCOUNTER — Other Ambulatory Visit: Payer: Self-pay

## 2022-01-21 DIAGNOSIS — D649 Anemia, unspecified: Secondary | ICD-10-CM

## 2022-01-21 DIAGNOSIS — K7030 Alcoholic cirrhosis of liver without ascites: Secondary | ICD-10-CM | POA: Diagnosis not present

## 2022-01-21 DIAGNOSIS — E43 Unspecified severe protein-calorie malnutrition: Secondary | ICD-10-CM | POA: Diagnosis not present

## 2022-01-21 DIAGNOSIS — L03115 Cellulitis of right lower limb: Secondary | ICD-10-CM | POA: Diagnosis not present

## 2022-01-21 DIAGNOSIS — R195 Other fecal abnormalities: Secondary | ICD-10-CM

## 2022-01-21 DIAGNOSIS — K259 Gastric ulcer, unspecified as acute or chronic, without hemorrhage or perforation: Secondary | ICD-10-CM

## 2022-01-21 DIAGNOSIS — R601 Generalized edema: Secondary | ICD-10-CM | POA: Diagnosis not present

## 2022-01-21 LAB — CBC
HCT: 26 % — ABNORMAL LOW (ref 36.0–46.0)
Hemoglobin: 8.4 g/dL — ABNORMAL LOW (ref 12.0–15.0)
MCH: 31.2 pg (ref 26.0–34.0)
MCHC: 32.3 g/dL (ref 30.0–36.0)
MCV: 96.7 fL (ref 80.0–100.0)
Platelets: 173 10*3/uL (ref 150–400)
RBC: 2.69 MIL/uL — ABNORMAL LOW (ref 3.87–5.11)
RDW: 16.8 % — ABNORMAL HIGH (ref 11.5–15.5)
WBC: 6.8 10*3/uL (ref 4.0–10.5)
nRBC: 0 % (ref 0.0–0.2)

## 2022-01-21 LAB — COMPREHENSIVE METABOLIC PANEL
ALT: 13 U/L (ref 0–44)
AST: 35 U/L (ref 15–41)
Albumin: 2.2 g/dL — ABNORMAL LOW (ref 3.5–5.0)
Alkaline Phosphatase: 139 U/L — ABNORMAL HIGH (ref 38–126)
Anion gap: 5 (ref 5–15)
BUN: 11 mg/dL (ref 8–23)
CO2: 30 mmol/L (ref 22–32)
Calcium: 7.7 mg/dL — ABNORMAL LOW (ref 8.9–10.3)
Chloride: 99 mmol/L (ref 98–111)
Creatinine, Ser: 0.54 mg/dL (ref 0.44–1.00)
GFR, Estimated: >60 mL/min (ref >60)
Glucose, Bld: 130 mg/dL — ABNORMAL HIGH (ref 70–99)
Potassium: 4.4 mmol/L (ref 3.5–5.1)
Sodium: 134 mmol/L — ABNORMAL LOW (ref 135–145)
Total Bilirubin: 0.6 mg/dL (ref 0.3–1.2)
Total Protein: 4.6 g/dL — ABNORMAL LOW (ref 6.5–8.1)

## 2022-01-21 NOTE — Telephone Encounter (Signed)
-----   Message from Tressia Danas, MD sent at 01/20/2022  7:23 PM EST ----- Office follow-up with me or an APP in 3-4 weeks. EGD with me in the [facility] in 8 weeks.  Thanks.  KLB

## 2022-01-21 NOTE — Progress Notes (Signed)
PROGRESS NOTE  Carolyn [name]  LKG:[id number] DOB: 1955/10/13 DOA: 01/06/2022 PCP: Farris Has, MD   Brief Narrative: 66 year old female with history of chronic alcohol pancreatitis, chronic pain syndrome, GERD, severe malnutrition, osteoporosis who presented initially with right foot wound that started as a blister/bulla.  Right foot x-ray raised concern for acute osteomyelitis but was negative for MRI. Evaluated by podiatry who recommended antibiotics for 7 days, wound care and outpatient follow-up.  Patient developed abdominal fullness for which KUB was done which showed a pleural effusion.  Chest x-ray confirmed large right pleural effusion.  IR did thoracentesis  x2 . Palliative care consulted for goals of care who recommended outpatient follow-up at a skilled nursing facility. GI consulted for drop of hemoglobin with positive FOBT. PT/OT recommending SNF on discharge.     Assessment & Plan:  Principal Problem:   Protein-calorie malnutrition, severe (HCC) Active Problems:   Chronic pancreatitis (HCC)   Osteomyelitis (HCC)   Hypokalemia   Hypomagnesemia   AKI (acute kidney injury) (HCC)   Chronic pain syndrome   Cellulitis of right foot   Normocytic anemia   Gastroesophageal reflux disease without esophagitis   Pressure injury of skin   Physical deconditioning   Pleural effusion   Abdominal distention   Acute respiratory failure with hypoxia (HCC)   Hypoalbuminemia   Anasarca   Alcoholic cirrhosis of liver without ascites (HCC)   Occult blood in stools   Portal hypertensive gastropathy (HCC)   Abnormal CT of the abdomen   Chronic gastric ulcer with perforation (HCC)   Acute hypoxic respiratory failure Improved Secondary to right pleural effusion, atelectasis Chest x-ray showed large pleural effusion.  BNP was also elevated Echocardiogram showed EF of 70 to 75%, grade 1 diastolic dysfunction Albumin was very low. S/P thora  x 2 S/P IV Lasix and IV albumin Continue  p.o. Lasix Supplemental O2 as needed currently on room air  Normocytic anemia Acute blood loss anemia Hemoglobin dropped to the range of 6, currently stable Anemia panel suggests anemia of chronic disease Positive  FOBT.  She was transfused with a unit of PRBC on 11/5 Consulted, s/p EGD on 11/7, which showed nonbleeding deeply cratered gastric ulcer, noted self-contained fistulous tract at the base of the ulcer which appeared to communicate with the duodenal bulb consistent with contained perforation General surgery was consulted, recommended no further management/imaging needed, recommend medical management Stomach biopsy showed minimal chronic gastritis with reactive epithelial changes negative for H. pylori, interstitial metaplasia or carcinoma Full liquid diet Pantoprazole 40 mg p.o. twice daily for 10 weeks then switch to 40 mg daily, sucralfate 1 g p.o. 4 times daily for 6 weeks Recommend repeat endoscopy in 8 weeks to evaluate ulcer for healing  Anasarca/hypoalbuminemia Noted dependent edema.  Likely nutritional.  Nutritionist consulted and following  Bilateral feet cellulitis/ulceration X-ray of right foot raise concern for cellulitis but osteomyelitis ruled out by MRI No plan for surgery, podiatry recommended 7 days course of antibiotics, wound care.  She  completed ancef on 10/4 Continue pain management, supportive care  Abdominal cramps: Continue Bentyl  Low magnesium/potassium/phosphorus: Continue to monitor and supplement as needed.    Chronic alcohol use associated cirrhosis Pancreatitis/pancreatic insufficiency/chronic pain syndrome: Sober over a year.  Continue home Butrans, Creon  Vitamin D/A insufficiency: Being supplemented  Physical deconditioning/ambulatory dysfunction: PT/OT recommended SNF  Severe protein calorie malnutrition: nutrition is consulted.  BMI of 16.7.  This is likely associated with chronic pancreatitis, multiple abdominal surgeries  Goals of  care:  Palliative care also consulted here.  Recommend outpatient follow-up at the SNF  Pressure Injury 01/07/22 Coccyx Mid Stage 1 -  Intact skin with non-blanchable redness of a localized area usually over a bony prominence. (Active)  01/07/22 1848  Location: Coccyx  Location Orientation: Mid  Staging: Stage 1 -  Intact skin with non-blanchable redness of a localized area usually over a bony prominence.  Wound Description (Comments):   Present on Admission: Yes      Nutrition Problem: Severe Malnutrition Etiology: chronic illness (chronic pancreatitis, h/o multiple gastric surgeries including gastric bypass) Pressure Injury 01/07/22 Coccyx Mid Stage 1 -  Intact skin with non-blanchable redness of a localized area usually over a bony prominence. (Active)  01/07/22 1848  Location: Coccyx  Location Orientation: Mid  Staging: Stage 1 -  Intact skin with non-blanchable redness of a localized area usually over a bony prominence.  Wound Description (Comments):   Present on Admission: Yes  Dressing Type Foam - Lift dressing to assess site every shift 01/21/22 0700    DVT prophylaxis:     Code Status: Full Code  Family Communication: None at bedside  Patient status:Inpatient  Patient is from :Home  Anticipated discharge to:SNF  Estimated DC date:TBD   Consultants: Palliative care, GI  Procedures:Thora  x2 , paracentesis   Antimicrobials:  Anti-infectives (From admission, onward)    Start     Dose/Rate Route Frequency Ordered Stop   01/09/22 1800  ceFAZolin (ANCEF) IVPB 1 g/50 mL premix        1 g 100 mL/hr over 30 Minutes Intravenous Every 8 hours 01/09/22 1513 01/16/22 1759   01/06/22 2030  cefTRIAXone (ROCEPHIN) 2 g in sodium chloride 0.9 % 100 mL IVPB  Status:  Discontinued       See Hyperspace for full Linked Orders Report.   2 g 200 mL/hr over 30 Minutes Intravenous Every 24 hours 01/06/22 2017 01/09/22 1513   01/06/22 2030  metroNIDAZOLE (FLAGYL) IVPB 500 mg   Status:  Discontinued       See Hyperspace for full Linked Orders Report.   500 mg 100 mL/hr over 60 Minutes Intravenous Every 12 hours 01/06/22 2017 01/09/22 1513       Subjective: Patient denies any new complaints.  Reports feeling tightness around abdomen   Objective: Vitals:   01/20/22 2057 01/21/22 0448 01/21/22 0504 01/21/22 1201  BP: 129/82 (!) 130/93  101/66  Pulse: (!) 110 (!) 119 (!) 108 100  Resp: 20 18  18   Temp: 99.6 F (37.6 C) 98.9 F (37.2 C)  97.9 F (36.6 C)  TempSrc: Oral Oral  Oral  SpO2: 97% 90% 95% 99%  Weight:      Height:        Intake/Output Summary (Last 24 hours) at 01/21/2022 1519 Last data filed at 01/21/2022 1412 Gross per 24 hour  Intake --  Output 1600 ml  Net -1600 ml   Filed Weights   01/06/22 1857  Weight: 49.9 kg    Examination: General: NAD, condition, chronically ill-appearing, cachectic Cardiovascular: S1, S2 present Respiratory: CTAB Abdomen: Soft, nontender, nondistended, bowel sounds present Musculoskeletal: No bilateral pedal edema noted Skin: Normal Psychiatry: Fair mood    Data Reviewed: I have personally reviewed following labs and imaging studies  CBC: Recent Labs  Lab 01/17/22 0259 01/18/22 0258 01/19/22 0328 01/20/22 0323 01/21/22 0228  WBC 3.7* 5.0 4.5 4.7 6.8  HGB 6.6* 8.3* 8.0* 8.1* 8.4*  HCT 20.0* 24.4* 23.8* 25.3* 26.0*  MCV 95.2 91.4  93.0 95.1 96.7  PLT 106* 149* 123* 133* 173   Basic Metabolic Panel: Recent Labs  Lab 01/15/22 0450 01/16/22 0410 01/17/22 0259 01/18/22 0258 01/19/22 0328 01/20/22 0323 01/21/22 0228  NA 135 134* 136 136 135 134* 134*  K 3.4* 3.7 3.2* 3.3* 3.4* 3.9 4.4  CL 98 97* 96* 97* 97* 97* 99  CO2 31 31 33* 34* 35* 32 30  GLUCOSE 167* 108* 106* 114* 122* 168* 130*  BUN 9 10 10 12 10 10 11   CREATININE 0.57 0.53 0.59 0.66 0.66 0.59 0.54  CALCIUM 7.0* 7.4* 7.4* 7.4* 7.3* 7.5* 7.7*  MG 1.8 1.5*  --  1.6* 1.5* 1.6*  --   PHOS 2.5 2.9  --  3.4 2.9  --   --       Recent Results (from the past 240 hour(s))  Body fluid culture w Gram Stain     Status: None   Collection Time: 01/12/22 12:30 PM   Specimen: Peritoneal Washings  Result Value Ref Range Status   Specimen Description   Final    PERITONEAL Performed at [facility], [address] [place], [place]    Special Requests   Final    NONE Performed at [facility], [address], [place], [place]    Gram Stain   Final    RARE WBC PRESENT,BOTH PMN AND MONONUCLEAR NO ORGANISMS SEEN    Culture   Final    NO GROWTH 3 DAYS Performed at [facility], [address] [place], [place]    Report Status 01/16/2022 FINAL  Final     Radiology Studies: No results found.  Scheduled Meds:  ascorbic acid  500 mg Oral BID   buprenorphine  2 patch Transdermal Weekly   Chlorhexidine Gluconate Cloth  6 each Topical Daily   docusate sodium  100 mg Oral BID   feeding supplement  237 mL Oral TID BM   furosemide  40 mg Oral Daily   lipase/protease/amylase  24,000 Units Oral TID WC   multivitamin with minerals  1 tablet Oral Daily   nortriptyline  30 mg Oral QHS   pantoprazole  40 mg Oral BID   potassium chloride  40 mEq Oral BID   silver sulfADIAZINE   Topical BID   sodium chloride flush  10-40 mL Intracatheter Q12H   sucralfate  1 g Oral TID WC & HS   vitamin A  50,000 Units Oral Daily   Vitamin D (Ergocalciferol)  50,000 Units Oral Q7 days   Continuous Infusions:     LOS: 14 days   13/06/2021, MD [facility] P11/11/2021, 3:19 PM

## 2022-01-21 NOTE — Care Management Important Message (Signed)
Important Message  Patient Details IM Letter given to the Patient. Name: Carolyn [name] MRN: [id number] Date of Birth: April 30, 1955   Medicare Important Message Given:  Yes     Caren Macadam 01/21/2022, 11:34 AM

## 2022-01-21 NOTE — Progress Notes (Signed)
Mobility Specialist - Progress Note   01/21/22 0953  Mobility  Activity Ambulated with assistance in hallway  Level of Assistance Minimal assist, patient does 75% or more  Assistive Device Front wheel walker  Distance Ambulated (ft) 350 ft  Activity Response Tolerated well  Mobility Referral Yes  $Mobility charge 1 Mobility   Pt received in bed and agreeable to mobility. Pt is MinA from sit-to-stand and contact during ambulation. Pt to recliner after session with all needs met w/ MD in room.    [facility]

## 2022-01-21 NOTE — Telephone Encounter (Signed)
OV scheduled for 02/11/22 at 3:00 pm with Amy, PA. EGD scheduled in [facility] for 03/23/22 at 10:00 am with Dr. Orvan Falconer. Amb ref placed. Not currently on BT/DM. Instructions sent to mychart. Will contact patient once she has been discharged.

## 2022-01-21 NOTE — Progress Notes (Signed)
Mobility Specialist - Progress Note   01/21/22 1410  Mobility  Activity Ambulated with assistance in hallway  Level of Assistance Minimal assist, patient does 75% or more  Assistive Device Front wheel walker  Distance Ambulated (ft) 400 ft  Activity Response Tolerated well  Mobility Referral Yes  $Mobility charge 1 Mobility   Pt received in recliner and agreeable to mobility. Pt is MinA from sit-to-stand & contact throughout ambulation. No complaints during mobility. Pt to bed after session with all needs met.      [facility]

## 2022-01-22 LAB — CBC
HCT: 26 % — ABNORMAL LOW (ref 36.0–46.0)
Hemoglobin: 8.2 g/dL — ABNORMAL LOW (ref 12.0–15.0)
MCH: 30.5 pg (ref 26.0–34.0)
MCHC: 31.5 g/dL (ref 30.0–36.0)
MCV: 96.7 fL (ref 80.0–100.0)
Platelets: 177 10*3/uL (ref 150–400)
RBC: 2.69 MIL/uL — ABNORMAL LOW (ref 3.87–5.11)
RDW: 16.4 % — ABNORMAL HIGH (ref 11.5–15.5)
WBC: 5.3 10*3/uL (ref 4.0–10.5)
nRBC: 0 % (ref 0.0–0.2)

## 2022-01-22 LAB — COMPREHENSIVE METABOLIC PANEL
ALT: 14 U/L (ref 0–44)
AST: 36 U/L (ref 15–41)
Albumin: 2.1 g/dL — ABNORMAL LOW (ref 3.5–5.0)
Alkaline Phosphatase: 139 U/L — ABNORMAL HIGH (ref 38–126)
Anion gap: 4 — ABNORMAL LOW (ref 5–15)
BUN: 10 mg/dL (ref 8–23)
CO2: 31 mmol/L (ref 22–32)
Calcium: 7.9 mg/dL — ABNORMAL LOW (ref 8.9–10.3)
Chloride: 100 mmol/L (ref 98–111)
Creatinine, Ser: 0.47 mg/dL (ref 0.44–1.00)
GFR, Estimated: >60 mL/min (ref >60)
Glucose, Bld: 83 mg/dL (ref 70–99)
Potassium: 4.4 mmol/L (ref 3.5–5.1)
Sodium: 135 mmol/L (ref 135–145)
Total Bilirubin: 0.6 mg/dL (ref 0.3–1.2)
Total Protein: 4.4 g/dL — ABNORMAL LOW (ref 6.5–8.1)

## 2022-01-22 LAB — AFP TUMOR MARKER: AFP, Serum, Tumor Marker: 3.9 ng/mL (ref 0.0–9.2)

## 2022-01-22 NOTE — Progress Notes (Signed)
Occupational Therapy Treatment Patient Details Name: Carolyn [name] MRN: [id number] DOB: August 23, 1955 Today's Date: 01/22/2022   History of present illness 66 year old female with history of chronic pancreatitis, chronic pain syndrome, GERD, severe malnutrition, osteoporosis who presented initially with right foot wound that started as a blister/bulla.  Right foot x-ray raised concern for acute osteomyelitis but was negative for MRI.  Chest x-ray confirmed large right pleural effusion.  IR did thoracentesis x2 .   OT comments  Patient was motivated to participate in the session. The session emphasized ADL instruction and re-training. The pt performed grooming and partial upper body bathing with SBA in standing at sink level, and she further required min assist for upper and lower body dressing. She was instructed to take seated rest breaks as needed for energy conservation purposes and general safety, given the pt's feelings of B LE weakness in prolonged standing. She continues to requires assist for sit to stand transfers. Continue OT plan of care to maximize her safety & independence with self-care tasks.    Recommendations for follow up therapy are one component of a multi-disciplinary discharge planning process, led by the attending physician.  Recommendations may be updated based on patient status, additional functional criteria and insurance authorization.    Follow Up Recommendations  Skilled nursing-short term rehab (<3 hours/day)    Assistance Recommended at Discharge Intermittent Supervision/Assistance  Patient can return home with the following  A little help with walking and/or transfers;Assistance with cooking/housework;Assist for transportation;A lot of help with bathing/dressing/bathroom   Equipment Recommendations  Other (comment) (TBD pending progress at next setting)       Precautions / Restrictions Precautions Precautions: Fall Other Brace: bilateral "post op"  shoes Restrictions Other Position/Activity Restrictions: WBAT per podiatry progress note       Mobility Bed Mobility        Sit to Supine: Stand-by assist        General bed mobility comments: pt was received seated in the bedside chair    Transfers Overall transfer level: Needs assistance Equipment used: Rolling walker (2 wheels) Transfers: Sit to/from Stand Sit to Stand: Min assist           General transfer comment: She required increased effort to stand from the lower chair surface, with cues needed to bend knees and increased B LE base of support         ADL either performed or assessed with clinical judgement   ADL Overall ADL's : Needs assistance/impaired Eating/Feeding: Independent   Grooming: Standing;Set up Grooming Details (indicate cue type and reason): She performed teeth brushing and hair in standing at the sink.         Upper Body Dressing : Sitting;Minimal assistance Upper Body Dressing Details (indicate cue type and reason): She doffed a gown, then donned another clean one in sitting at chair level Lower Body Dressing: Minimal assistance;Sit to/from stand Lower Body Dressing Details (indicate cue type and reason): She pulled her underwear up to her knees in sitting, then stood using a RW, requiring min assist to pull them up over her hips.                     Cognition Arousal/Alertness: Awake/alert Behavior During Therapy: WFL for tasks assessed/performed Overall Cognitive Status: Within Functional Limits for tasks assessed                        Pertinent Vitals/ Pain  Pain Assessment Pain Assessment: 0-10 Pain Score: 3  Pain Location: feet and lower legs Pain Intervention(s): Limited activity within patient's tolerance, Repositioned         Frequency  Min 2X/week        Progress Toward Goals  OT Goals(current goals can now be found in the care plan section)  Progress towards OT goals: Progressing  toward goals  Acute Rehab OT Goals OT Goal Formulation: With patient Time For Goal Achievement: 01/23/22 Potential to Achieve Goals: Good  Plan Discharge plan remains appropriate       AM-PAC OT "6 Clicks" Daily Activity     Outcome Measure   Help from another person eating meals?: None Help from another person taking care of personal grooming?: None Help from another person toileting, which includes using toliet, bedpan, or urinal?: A Lot Help from another person bathing (including washing, rinsing, drying)?: A Lot Help from another person to put on and taking off regular upper body clothing?: A Little Help from another person to put on and taking off regular lower body clothing?: A Little 6 Click Score: 18    End of Session Equipment Utilized During Treatment: Rolling walker (2 wheels);Gait belt  OT Visit Diagnosis: Pain;Muscle weakness (generalized) (M62.81);Unsteadiness on feet (R26.81)   Activity Tolerance Patient tolerated treatment well   Patient Left in bed;with call bell/phone within reach;with bed alarm set   Nurse Communication Mobility status        Time: 1751-0258 OT Time Calculation (min): 30 min  Charges: OT General Charges $OT Visit: 1 Visit OT Treatments $Self Care/Home Management : 23-37 mins   Reuben Likes, OTR/L 01/22/2022, 12:25 PM

## 2022-01-22 NOTE — Progress Notes (Signed)
Physical Therapy Treatment Patient Details Name: Carolyn [name] MRN: [id number] DOB: [birth year] Today's Date: 01/22/2022   History of Present Illness 66 year old female with history of chronic pancreatitis, chronic pain syndrome, GERD, severe malnutrition, osteoporosis who presented initially with right foot wound that started as a blister/bulla.  Right foot x-ray raised concern for acute osteomyelitis but was negative for MRI.  Chest x-ray confirmed large right pleural effusion.  IR did thoracentesis x2 .    PT Comments    Pt assisted with ambulating in hallway and performed LE exercises.  Pt anticipates d/c to SNF soon.    Recommendations for follow up therapy are one component of a multi-disciplinary discharge planning process, led by the attending physician.  Recommendations may be updated based on patient status, additional functional criteria and insurance authorization.  Follow Up Recommendations  Skilled nursing-short term rehab (<3 hours/day) Can patient physically be transported by private vehicle: Yes   Assistance Recommended at Discharge Frequent or constant Supervision/Assistance  Patient can return home with the following A little help with walking and/or transfers;A little help with bathing/dressing/bathroom;Assistance with cooking/housework;Assist for transportation;Help with stairs or ramp for entrance   Equipment Recommendations  Other (comment) (next venue)    Recommendations for Other Services       Precautions / Restrictions Precautions Precautions: Fall Other Brace: bilateral "post op" shoes Restrictions Other Position/Activity Restrictions: WBAT per podiatry progress note     Mobility  Bed Mobility Overal bed mobility: Needs Assistance Bed Mobility: Supine to Sit     Supine to sit: Supervision          Transfers Overall transfer level: Needs assistance Equipment used: Rolling walker (2 wheels) Transfers: Sit to/from Stand Sit to Stand: Min  assist           General transfer comment: assist to rise and steady, cues for UE and LE positioning, weight shifting    Ambulation/Gait Ambulation/Gait assistance: Min guard Gait Distance (Feet): 200 Feet Assistive device: Rolling walker (2 wheels) Gait Pattern/deviations: Step-through pattern, Decreased stride length, Trunk flexed, Narrow base of support Gait velocity: decreased     General Gait Details: verbal cues for RW positioning, posture, narrow BOS especially with turning; HR 136 bpm   Stairs             Wheelchair Mobility    Modified Rankin (Stroke Patients Only)       Balance                                            Cognition Arousal/Alertness: Awake/alert Behavior During Therapy: WFL for tasks assessed/performed Overall Cognitive Status: Within Functional Limits for tasks assessed                                          Exercises General Exercises - Lower Extremity Ankle Circles/Pumps: AROM, 10 reps, Both, Seated Long Arc Quad: AROM, Both, 10 reps, Seated Hip Flexion/Marching: AROM, Both, 10 reps, Seated    General Comments        Pertinent Vitals/Pain Pain Assessment Pain Assessment: Faces Faces Pain Scale: Hurts little more Pain Location: feet tender with donning post op shoes Pain Descriptors / Indicators: Tender, Grimacing, Sore Pain Intervention(s): Repositioned, Monitored during session    Home Living  Prior Function            PT Goals (current goals can now be found in the care plan section) Acute Rehab PT Goals PT Goal Formulation: With patient Time For Goal Achievement: 02/05/22 Potential to Achieve Goals: Good Progress towards PT goals: Progressing toward goals    Frequency    Min 2X/week      PT Plan Current plan remains appropriate    Co-evaluation              AM-PAC PT "6 Clicks" Mobility   Outcome Measure  Help needed  turning from your back to your side while in a flat bed without using bedrails?: A Little Help needed moving from lying on your back to sitting on the side of a flat bed without using bedrails?: A Little Help needed moving to and from a bed to a chair (including a wheelchair)?: A Little Help needed standing up from a chair using your arms (e.g., wheelchair or bedside chair)?: A Little Help needed to walk in hospital room?: A Little Help needed climbing 3-5 steps with a railing? : A Lot 6 Click Score: 17    End of Session Equipment Utilized During Treatment: Gait belt Activity Tolerance: Patient tolerated treatment well Patient left: in chair;with call bell/phone within reach;with chair alarm set Nurse Communication: Mobility status PT Visit Diagnosis: Other abnormalities of gait and mobility (R26.89)     Time: 1005-1020 PT Time Calculation (min) (ACUTE ONLY): 15 min  Charges:  $Gait Training: 8-22 mins                    Thomasene Mohair PT, DPT Physical Therapist Acute Rehabilitation Services Preferred contact method: Secure Chat Weekend Pager Only: [phone] Office: [phone]    [name] 01/22/2022, 12:45 PM

## 2022-01-22 NOTE — Progress Notes (Signed)
PROGRESS NOTE  Carolyn [name]  LKG:[id number] DOB: 1955/10/13 DOA: 01/06/2022 PCP: Farris Has, MD   Brief Narrative: 66 year old female with history of chronic alcohol pancreatitis, chronic pain syndrome, GERD, severe malnutrition, osteoporosis who presented initially with right foot wound that started as a blister/bulla.  Right foot x-ray raised concern for acute osteomyelitis but was negative for MRI. Evaluated by podiatry who recommended antibiotics for 7 days, wound care and outpatient follow-up.  Patient developed abdominal fullness for which KUB was done which showed a pleural effusion.  Chest x-ray confirmed large right pleural effusion.  IR did thoracentesis  x2 . Palliative care consulted for goals of care who recommended outpatient follow-up at a skilled nursing facility. GI consulted for drop of hemoglobin with positive FOBT. PT/OT recommending SNF on discharge.     Assessment & Plan:  Principal Problem:   Protein-calorie malnutrition, severe (HCC) Active Problems:   Chronic pancreatitis (HCC)   Osteomyelitis (HCC)   Hypokalemia   Hypomagnesemia   AKI (acute kidney injury) (HCC)   Chronic pain syndrome   Cellulitis of right foot   Normocytic anemia   Gastroesophageal reflux disease without esophagitis   Pressure injury of skin   Physical deconditioning   Pleural effusion   Abdominal distention   Acute respiratory failure with hypoxia (HCC)   Hypoalbuminemia   Anasarca   Alcoholic cirrhosis of liver without ascites (HCC)   Occult blood in stools   Portal hypertensive gastropathy (HCC)   Abnormal CT of the abdomen   Chronic gastric ulcer with perforation (HCC)   Acute hypoxic respiratory failure Improved Secondary to right pleural effusion, atelectasis Chest x-ray showed large pleural effusion.  BNP was also elevated Echocardiogram showed EF of 70 to 75%, grade 1 diastolic dysfunction Albumin was very low. S/P thora  x 2 S/P IV Lasix and IV albumin Continue  p.o. Lasix Supplemental O2 as needed currently on room air  Normocytic anemia Acute blood loss anemia Hemoglobin dropped to the range of 6, currently stable Anemia panel suggests anemia of chronic disease Positive  FOBT.  She was transfused with a unit of PRBC on 11/5 Consulted, s/p EGD on 11/7, which showed nonbleeding deeply cratered gastric ulcer, noted self-contained fistulous tract at the base of the ulcer which appeared to communicate with the duodenal bulb consistent with contained perforation General surgery was consulted, recommended no further management/imaging needed, recommend medical management Stomach biopsy showed minimal chronic gastritis with reactive epithelial changes negative for H. pylori, interstitial metaplasia or carcinoma Full liquid diet Pantoprazole 40 mg p.o. twice daily for 10 weeks then switch to 40 mg daily, sucralfate 1 g p.o. 4 times daily for 6 weeks Recommend repeat endoscopy in 8 weeks to evaluate ulcer for healing  Anasarca/hypoalbuminemia Noted dependent edema.  Likely nutritional.  Nutritionist consulted and following  Bilateral feet cellulitis/ulceration X-ray of right foot raise concern for cellulitis but osteomyelitis ruled out by MRI No plan for surgery, podiatry recommended 7 days course of antibiotics, wound care.  She  completed ancef on 10/4 Continue pain management, supportive care  Abdominal cramps: Continue Bentyl  Low magnesium/potassium/phosphorus: Continue to monitor and supplement as needed.    Chronic alcohol use associated cirrhosis Pancreatitis/pancreatic insufficiency/chronic pain syndrome: Sober over a year.  Continue home Butrans, Creon  Vitamin D/A insufficiency: Being supplemented  Physical deconditioning/ambulatory dysfunction: PT/OT recommended SNF  Severe protein calorie malnutrition: nutrition is consulted.  BMI of 16.7.  This is likely associated with chronic pancreatitis, multiple abdominal surgeries  Goals of  care:  Palliative care also consulted here.  Recommend outpatient follow-up at the SNF  Pressure Injury 01/07/22 Coccyx Mid Stage 1 -  Intact skin with non-blanchable redness of a localized area usually over a bony prominence. (Active)  01/07/22 1848  Location: Coccyx  Location Orientation: Mid  Staging: Stage 1 -  Intact skin with non-blanchable redness of a localized area usually over a bony prominence.  Wound Description (Comments):   Present on Admission: Yes      Nutrition Problem: Severe Malnutrition Etiology: chronic illness (chronic pancreatitis, h/o multiple gastric surgeries including gastric bypass) Pressure Injury 01/07/22 Coccyx Mid Stage 1 -  Intact skin with non-blanchable redness of a localized area usually over a bony prominence. (Active)  01/07/22 1848  Location: Coccyx  Location Orientation: Mid  Staging: Stage 1 -  Intact skin with non-blanchable redness of a localized area usually over a bony prominence.  Wound Description (Comments):   Present on Admission: Yes  Dressing Type Foam - Lift dressing to assess site every shift 01/21/22 2000    DVT prophylaxis:     Code Status: Full Code  Family Communication: None at bedside  Patient status:Inpatient  Patient is from :Home  Anticipated discharge to:SNF  Estimated DC date:TBD   Consultants: Palliative care, GI  Procedures:Thora  x2 , paracentesis   Antimicrobials:  Anti-infectives (From admission, onward)    Start     Dose/Rate Route Frequency Ordered Stop   01/09/22 1800  ceFAZolin (ANCEF) IVPB 1 g/50 mL premix        1 g 100 mL/hr over 30 Minutes Intravenous Every 8 hours 01/09/22 1513 01/16/22 1759   01/06/22 2030  cefTRIAXone (ROCEPHIN) 2 g in sodium chloride 0.9 % 100 mL IVPB  Status:  Discontinued       See Hyperspace for full Linked Orders Report.   2 g 200 mL/hr over 30 Minutes Intravenous Every 24 hours 01/06/22 2017 01/09/22 1513   01/06/22 2030  metroNIDAZOLE (FLAGYL) IVPB 500 mg   Status:  Discontinued       See Hyperspace for full Linked Orders Report.   500 mg 100 mL/hr over 60 Minutes Intravenous Every 12 hours 01/06/22 2017 01/09/22 1513       Subjective: Patient denies any new complaints.  Patient is stable to discharge to SNF. TOC  alerted   Objective: Vitals:   01/21/22 1201 01/21/22 1950 01/22/22 0511 01/22/22 1228  BP: 101/66 137/89 134/78 101/63  Pulse: 100 (!) 108 87 93  Resp: 18 17 15 18   Temp: 97.9 F (36.6 C) 97.8 F (36.6 C) 97.8 F (36.6 C) 98.1 F (36.7 C)  TempSrc: Oral Oral Oral Oral  SpO2: 99% 100% 100% 100%  Weight:      Height:        Intake/Output Summary (Last 24 hours) at 01/22/2022 1642 Last data filed at 01/22/2022 [id number] Gross per 24 hour  Intake --  Output 2750 ml  Net -2750 ml   Filed Weights   01/06/22 1857  Weight: 49.9 kg    Examination: General: NAD, condition, chronically ill-appearing, cachectic Cardiovascular: S1, S2 present Respiratory: CTAB Abdomen: Soft, nontender, nondistended, bowel sounds present Musculoskeletal: No bilateral pedal edema noted Skin: Normal Psychiatry: Fair mood    Data Reviewed: I have personally reviewed following labs and imaging studies  CBC: Recent Labs  Lab 01/18/22 0258 01/19/22 0328 01/20/22 0323 01/21/22 0228 01/22/22 0457  WBC 5.0 4.5 4.7 6.8 5.3  HGB 8.3* 8.0* 8.1* 8.4* 8.2*  HCT 24.4* 23.8* 25.3* 26.0*  26.0*  MCV 91.4 93.0 95.1 96.7 96.7  PLT 149* 123* 133* 173 177   Basic Metabolic Panel: Recent Labs  Lab 01/16/22 0410 01/17/22 0259 01/18/22 0258 01/19/22 0328 01/20/22 0323 01/21/22 0228 01/22/22 0457  NA 134*   < > 136 135 134* 134* 135  K 3.7   < > 3.3* 3.4* 3.9 4.4 4.4  CL 97*   < > 97* 97* 97* 99 100  CO2 31   < > 34* 35* 32 30 31  GLUCOSE 108*   < > 114* 122* 168* 130* 83  BUN 10   < > 12 10 10 11 10   CREATININE 0.53   < > 0.66 0.66 0.59 0.54 0.47  CALCIUM 7.4*   < > 7.4* 7.3* 7.5* 7.7* 7.9*  MG 1.5*  --  1.6* 1.5* 1.6*  --   --    PHOS 2.9  --  3.4 2.9  --   --   --    < > = values in this interval not displayed.     No results found for this or any previous visit (from the past 240 hour(s)).    Radiology Studies: No results found.  Scheduled Meds:  ascorbic acid  500 mg Oral BID   buprenorphine  2 patch Transdermal Weekly   Chlorhexidine Gluconate Cloth  6 each Topical Daily   docusate sodium  100 mg Oral BID   feeding supplement  237 mL Oral TID BM   furosemide  40 mg Oral Daily   lipase/protease/amylase  24,000 Units Oral TID WC   multivitamin with minerals  1 tablet Oral Daily   nortriptyline  30 mg Oral QHS   pantoprazole  40 mg Oral BID   potassium chloride  40 mEq Oral BID   silver sulfADIAZINE   Topical BID   sodium chloride flush  10-40 mL Intracatheter Q12H   sucralfate  1 g Oral TID WC & HS   vitamin A  50,000 Units Oral Daily   Vitamin D (Ergocalciferol)  50,000 Units Oral Q7 days   Continuous Infusions:     LOS: 15 days   , MD [facility] P11/12/2021, 4:42 PM

## 2022-01-22 NOTE — TOC Progression Note (Addendum)
Transition of Care [facility]) - Progression Note    Patient Details  Name: Carolyn [name] MRN: [id number] Date of Birth: 1955/12/19  [facility] of [facility]) CM/SW Contact  Golda Acre, RN Phone Number: 01/22/2022, 9:43 AM  Clinical Narrative:    Per conversation with the husband and facilities that have made a bed offers-[facility] the [facility] only has accepted [facility]. Auth for [facility] started with [facility] at 1355.       Expected Discharge Plan and Services                                                 Social Determinants of Health (SDOH) Interventions    Readmission Risk Interventions   No data to display

## 2022-01-22 NOTE — Progress Notes (Signed)
Mobility Specialist - Progress Note  01/22/22 1506  Mobility  Activity Ambulated with assistance in hallway  Level of Assistance Standby assist, set-up cues, supervision of patient - no hands on  Assistive Device Front wheel walker  Distance Ambulated (ft) 350 ft  Activity Response Tolerated well  Mobility Referral Yes  $Mobility charge 1 Mobility   Pt received in bed and agreeable to mobility. No complaints during mobility. Pt to bed after session with all needs met.    [facility]

## 2022-01-22 NOTE — Progress Notes (Signed)
OT Cancellation Note  Patient Details Name: Carolyn [name] MRN: [id number] DOB: [birth year]   Cancelled Treatment:    Reason Eval/Treat Not Completed: Other (comment). Patient is currently working with PT.    Reuben Likes, OTR/L 01/22/2022, 10:13 AM

## 2022-01-22 NOTE — Progress Notes (Signed)
Patient Carolyn [name]      DOB: 1956/01/08      QAS:[id number]      Palliative Medicine Team    Subjective: Bedside symptom check completed. No family or visitors bedside.   Physical exam: Patient resting in bed with eyes closed at time of visit. Breathing even and non-labored on room air, no excessive secretions noted. Patient without physical or non-verbal signs of pain or discomfort at this time. Patient easily awakens when this RN enters room. She states that pain is reasonably controlled with occasional use of the PRN medication but that she is able to move around and rest well.    Assessment and plan: Patient is hopeful to discharge soon to SNF rehab and has no additional needs or concerns at this time. Bedside RN Iris without needs or concerns either. Will continue to follow for any changes or advances.    Thank you for allowing the Palliative Medicine Team to assist in the care of this patient.     Shelda Jakes, MSN, RN4 Palliative Medicine Team Team Phone: [phone]  This phone is monitored 7a-7p, please reach out to attending physician outside of these hours for urgent needs.

## 2022-01-23 NOTE — Progress Notes (Signed)
Mobility Specialist - Progress Note   01/23/22 1119  Mobility  Activity Ambulated with assistance in hallway  Level of Assistance Standby assist, set-up cues, supervision of patient - no hands on  Assistive Device Front wheel walker  Distance Ambulated (ft) 1050 ft  Range of Motion/Exercises Active  Activity Response Tolerated well  Mobility Referral Yes  $Mobility charge 1 Mobility   Pt received in bed and agreeable to mobility. Pt is ModA for sit-to-stands and standby during ambulation. Mobility cut short due to SOB & high HR. Pt to bed after session with all needs met.    During mobility: 142bpm HR Post  mobility: 105bpm HR  Set designer

## 2022-01-23 NOTE — Progress Notes (Signed)
PROGRESS NOTE  Carolyn [name]  LKG:[id number] DOB: 1955/10/13 DOA: 01/06/2022 PCP: Farris Has, MD   Brief Narrative: 66 year old female with history of chronic alcohol pancreatitis, chronic pain syndrome, GERD, severe malnutrition, osteoporosis who presented initially with right foot wound that started as a blister/bulla.  Right foot x-ray raised concern for acute osteomyelitis but was negative for MRI. Evaluated by podiatry who recommended antibiotics for 7 days, wound care and outpatient follow-up.  Patient developed abdominal fullness for which KUB was done which showed a pleural effusion.  Chest x-ray confirmed large right pleural effusion.  IR did thoracentesis  x2 . Palliative care consulted for goals of care who recommended outpatient follow-up at a skilled nursing facility. GI consulted for drop of hemoglobin with positive FOBT. PT/OT recommending SNF on discharge.     Assessment & Plan:  Principal Problem:   Protein-calorie malnutrition, severe (HCC) Active Problems:   Chronic pancreatitis (HCC)   Osteomyelitis (HCC)   Hypokalemia   Hypomagnesemia   AKI (acute kidney injury) (HCC)   Chronic pain syndrome   Cellulitis of right foot   Normocytic anemia   Gastroesophageal reflux disease without esophagitis   Pressure injury of skin   Physical deconditioning   Pleural effusion   Abdominal distention   Acute respiratory failure with hypoxia (HCC)   Hypoalbuminemia   Anasarca   Alcoholic cirrhosis of liver without ascites (HCC)   Occult blood in stools   Portal hypertensive gastropathy (HCC)   Abnormal CT of the abdomen   Chronic gastric ulcer with perforation (HCC)   Acute hypoxic respiratory failure Improved Secondary to right pleural effusion, atelectasis Chest x-ray showed large pleural effusion.  BNP was also elevated Echocardiogram showed EF of 70 to 75%, grade 1 diastolic dysfunction Albumin was very low. S/P thora  x 2 S/P IV Lasix and IV albumin Continue  p.o. Lasix Supplemental O2 as needed currently on room air  Normocytic anemia Acute blood loss anemia Hemoglobin dropped to the range of 6, currently stable Anemia panel suggests anemia of chronic disease Positive  FOBT.  She was transfused with a unit of PRBC on 11/5 Consulted, s/p EGD on 11/7, which showed nonbleeding deeply cratered gastric ulcer, noted self-contained fistulous tract at the base of the ulcer which appeared to communicate with the duodenal bulb consistent with contained perforation General surgery was consulted, recommended no further management/imaging needed, recommend medical management Stomach biopsy showed minimal chronic gastritis with reactive epithelial changes negative for H. pylori, interstitial metaplasia or carcinoma Full liquid diet Pantoprazole 40 mg p.o. twice daily for 10 weeks then switch to 40 mg daily, sucralfate 1 g p.o. 4 times daily for 6 weeks Recommend repeat endoscopy in 8 weeks to evaluate ulcer for healing  Anasarca/hypoalbuminemia Noted dependent edema.  Likely nutritional.  Nutritionist consulted and following  Bilateral feet cellulitis/ulceration X-ray of right foot raise concern for cellulitis but osteomyelitis ruled out by MRI No plan for surgery, podiatry recommended 7 days course of antibiotics, wound care.  She  completed ancef on 10/4 Continue pain management, supportive care  Abdominal cramps: Continue Bentyl  Low magnesium/potassium/phosphorus: Continue to monitor and supplement as needed.    Chronic alcohol use associated cirrhosis Pancreatitis/pancreatic insufficiency/chronic pain syndrome: Sober over a year.  Continue home Butrans, Creon  Vitamin D/A insufficiency: Being supplemented  Physical deconditioning/ambulatory dysfunction: PT/OT recommended SNF  Severe protein calorie malnutrition: nutrition is consulted.  BMI of 16.7.  This is likely associated with chronic pancreatitis, multiple abdominal surgeries  Goals of  care:  Palliative care also consulted here.  Recommend outpatient follow-up at the SNF  Pressure Injury 01/07/22 Coccyx Mid Stage 1 -  Intact skin with non-blanchable redness of a localized area usually over a bony prominence. (Active)  01/07/22 1848  Location: Coccyx  Location Orientation: Mid  Staging: Stage 1 -  Intact skin with non-blanchable redness of a localized area usually over a bony prominence.  Wound Description (Comments):   Present on Admission: Yes      Nutrition Problem: Severe Malnutrition Etiology: chronic illness (chronic pancreatitis, h/o multiple gastric surgeries including gastric bypass) Pressure Injury 01/07/22 Coccyx Mid Stage 1 -  Intact skin with non-blanchable redness of a localized area usually over a bony prominence. (Active)  01/07/22 1848  Location: Coccyx  Location Orientation: Mid  Staging: Stage 1 -  Intact skin with non-blanchable redness of a localized area usually over a bony prominence.  Wound Description (Comments):   Present on Admission: Yes  Dressing Type Foam - Lift dressing to assess site every shift 01/22/22 0800    DVT prophylaxis:     Code Status: Full Code  Family Communication: None at bedside  Patient status:Inpatient  Patient is from :Home  Anticipated discharge to:SNF  Estimated DC date: Stable to DC once bed available at SNF   Consultants: Palliative care, GI  Procedures:Thora  x2 , paracentesis   Antimicrobials:  Anti-infectives (From admission, onward)    Start     Dose/Rate Route Frequency Ordered Stop   01/09/22 1800  ceFAZolin (ANCEF) IVPB 1 g/50 mL premix        1 g 100 mL/hr over 30 Minutes Intravenous Every 8 hours 01/09/22 1513 01/16/22 1759   01/06/22 2030  cefTRIAXone (ROCEPHIN) 2 g in sodium chloride 0.9 % 100 mL IVPB  Status:  Discontinued       See Hyperspace for full Linked Orders Report.   2 g 200 mL/hr over 30 Minutes Intravenous Every 24 hours 01/06/22 2017 01/09/22 1513   01/06/22 2030   metroNIDAZOLE (FLAGYL) IVPB 500 mg  Status:  Discontinued       See Hyperspace for full Linked Orders Report.   500 mg 100 mL/hr over 60 Minutes Intravenous Every 12 hours 01/06/22 2017 01/09/22 1513       Subjective: Patient denies any new complaints.  Noted to be significantly tachycardic on ambulation today.   Objective: Vitals:   01/22/22 1228 01/22/22 1959 01/23/22 0535 01/23/22 1307  BP: 101/63 (!) 147/95 121/77 122/76  Pulse: 93 (!) 115 (!) 104 (!) 106  Resp: 18 17 17 20   Temp: 98.1 F (36.7 C) 98.8 F (37.1 C) 98 F (36.7 C) 98.4 F (36.9 C)  TempSrc: Oral Oral Oral Oral  SpO2: 100% 99% 100% 99%  Weight:      Height:        Intake/Output Summary (Last 24 hours) at 01/23/2022 1635 Last data filed at 01/23/2022 0617 Gross per 24 hour  Intake --  Output 1750 ml  Net -1750 ml   Filed Weights   01/06/22 1857  Weight: 49.9 kg    Examination: General: NAD, condition, chronically ill-appearing, cachectic Cardiovascular: S1, S2 present Respiratory: CTAB Abdomen: Soft, nontender, nondistended, bowel sounds present Musculoskeletal: No bilateral pedal edema noted Skin: Normal Psychiatry: Normal mood    Data Reviewed: I have personally reviewed following labs and imaging studies  CBC: Recent Labs  Lab 01/18/22 0258 01/19/22 0328 01/20/22 0323 01/21/22 0228 01/22/22 0457  WBC 5.0 4.5 4.7 6.8 5.3  HGB 8.3* 8.0*  8.1* 8.4* 8.2*  HCT 24.4* 23.8* 25.3* 26.0* 26.0*  MCV 91.4 93.0 95.1 96.7 96.7  PLT 149* 123* 133* 173 177   Basic Metabolic Panel: Recent Labs  Lab 01/18/22 0258 01/19/22 0328 01/20/22 0323 01/21/22 0228 01/22/22 0457  NA 136 135 134* 134* 135  K 3.3* 3.4* 3.9 4.4 4.4  CL 97* 97* 97* 99 100  CO2 34* 35* 32 30 31  GLUCOSE 114* 122* 168* 130* 83  BUN 12 10 10 11 10   CREATININE 0.66 0.66 0.59 0.54 0.47  CALCIUM 7.4* 7.3* 7.5* 7.7* 7.9*  MG 1.6* 1.5* 1.6*  --   --   PHOS 3.4 2.9  --   --   --      No results found for this or any  previous visit (from the past 240 hour(s)).    Radiology Studies: No results found.  Scheduled Meds:  ascorbic acid  500 mg Oral BID   buprenorphine  2 patch Transdermal Weekly   Chlorhexidine Gluconate Cloth  6 each Topical Daily   docusate sodium  100 mg Oral BID   feeding supplement  237 mL Oral TID BM   furosemide  40 mg Oral Daily   lipase/protease/amylase  24,000 Units Oral TID WC   multivitamin with minerals  1 tablet Oral Daily   nortriptyline  30 mg Oral QHS   pantoprazole  40 mg Oral BID   potassium chloride  40 mEq Oral BID   silver sulfADIAZINE   Topical BID   sodium chloride flush  10-40 mL Intracatheter Q12H   sucralfate  1 g Oral TID WC & HS   vitamin A  50,000 Units Oral Daily   Vitamin D (Ergocalciferol)  50,000 Units Oral Q7 days   Continuous Infusions:     LOS: 16 days   , MD [facility] P11/01/2022, 4:35 PM

## 2022-01-24 LAB — BASIC METABOLIC PANEL
Anion gap: 7 (ref 5–15)
BUN: 11 mg/dL (ref 8–23)
CO2: 28 mmol/L (ref 22–32)
Calcium: 7.6 mg/dL — ABNORMAL LOW (ref 8.9–10.3)
Chloride: 98 mmol/L (ref 98–111)
Creatinine, Ser: 0.56 mg/dL (ref 0.44–1.00)
GFR, Estimated: >60 mL/min (ref >60)
Glucose, Bld: 101 mg/dL — ABNORMAL HIGH (ref 70–99)
Potassium: 4 mmol/L (ref 3.5–5.1)
Sodium: 133 mmol/L — ABNORMAL LOW (ref 135–145)

## 2022-01-24 LAB — CBC
HCT: 25.9 % — ABNORMAL LOW (ref 36.0–46.0)
Hemoglobin: 8.4 g/dL — ABNORMAL LOW (ref 12.0–15.0)
MCH: 31.1 pg (ref 26.0–34.0)
MCHC: 32.4 g/dL (ref 30.0–36.0)
MCV: 95.9 fL (ref 80.0–100.0)
Platelets: 209 10*3/uL (ref 150–400)
RBC: 2.7 MIL/uL — ABNORMAL LOW (ref 3.87–5.11)
RDW: 16.2 % — ABNORMAL HIGH (ref 11.5–15.5)
WBC: 8.1 10*3/uL (ref 4.0–10.5)
nRBC: 0 % (ref 0.0–0.2)

## 2022-01-24 NOTE — Progress Notes (Signed)
PROGRESS NOTE  Carolyn [name]  YHC:[id number] DOB: January 15, 1956 DOA: 01/06/2022 PCP: Farris Has, MD   Brief Narrative: 66 year old female with history of chronic alcohol pancreatitis, chronic pain syndrome, GERD, severe malnutrition, osteoporosis who presented initially with right foot wound that started as a blister/bulla.  Right foot x-ray raised concern for acute osteomyelitis but was negative for MRI. Evaluated by podiatry who recommended antibiotics for 7 days, wound care and outpatient follow-up.  Patient developed abdominal fullness for which KUB was done which showed a pleural effusion.  Chest x-ray confirmed large right pleural effusion.  IR did thoracentesis  x2 . Palliative care consulted for goals of care who recommended outpatient follow-up at a skilled nursing facility. GI consulted for drop of hemoglobin with positive FOBT. PT/OT recommending SNF on discharge.     Assessment & Plan:  Principal Problem:   Protein-calorie malnutrition, severe (HCC) Active Problems:   Chronic pancreatitis (HCC)   Osteomyelitis (HCC)   Hypokalemia   Hypomagnesemia   AKI (acute kidney injury) (HCC)   Chronic pain syndrome   Cellulitis of right foot   Normocytic anemia   Gastroesophageal reflux disease without esophagitis   Pressure injury of skin   Physical deconditioning   Pleural effusion   Abdominal distention   Acute respiratory failure with hypoxia (HCC)   Hypoalbuminemia   Anasarca   Alcoholic cirrhosis of liver without ascites (HCC)   Occult blood in stools   Portal hypertensive gastropathy (HCC)   Abnormal CT of the abdomen   Chronic gastric ulcer with perforation (HCC)   Acute hypoxic respiratory failure Improved Secondary to right pleural effusion, atelectasis Chest x-ray showed large pleural effusion.  BNP was also elevated Echocardiogram showed EF of 70 to 75%, grade 1 diastolic dysfunction Albumin was very low. S/P thora  x 2 S/P IV Lasix and IV albumin Continue  p.o. Lasix Supplemental O2 as needed currently on room air  Normocytic anemia Acute blood loss anemia Hemoglobin dropped to the range of 6, currently stable Anemia panel suggests anemia of chronic disease Positive  FOBT.  She was transfused with a unit of PRBC on 11/5 Consulted, s/p EGD on 11/7, which showed nonbleeding deeply cratered gastric ulcer, noted self-contained fistulous tract at the base of the ulcer which appeared to communicate with the duodenal bulb consistent with contained perforation General surgery was consulted, recommended no further management/imaging needed, recommend medical management Stomach biopsy showed minimal chronic gastritis with reactive epithelial changes negative for H. pylori, interstitial metaplasia or carcinoma Pantoprazole 40 mg p.o. twice daily for 10 weeks then switch to 40 mg daily, sucralfate 1 g p.o. 4 times daily for 6 weeks Recommend repeat endoscopy in 8 weeks to evaluate ulcer for healing  Anasarca/hypoalbuminemia Noted dependent edema.  Likely nutritional.  Nutritionist consulted and following  Bilateral feet cellulitis/ulceration X-ray of right foot raise concern for cellulitis but osteomyelitis ruled out by MRI No plan for surgery, podiatry recommended 7 days course of antibiotics, wound care.  She  completed ancef on 10/4 Continue pain management, supportive care  Abdominal cramps: Continue Bentyl  Low magnesium/potassium/phosphorus: Continue to monitor and supplement as needed.    Chronic alcohol use associated cirrhosis Pancreatitis/pancreatic insufficiency/chronic pain syndrome: Sober over a year.  Continue home Butrans, Creon  Vitamin D/A insufficiency: Being supplemented  Physical deconditioning/ambulatory dysfunction: PT/OT recommended SNF  Severe protein calorie malnutrition: nutrition is consulted.  BMI of 16.7.  This is likely associated with chronic pancreatitis, multiple abdominal surgeries  Goals of care: Palliative  care also  consulted here.  Recommend outpatient follow-up at the SNF  Pressure Injury 01/07/22 Coccyx Mid Stage 1 -  Intact skin with non-blanchable redness of a localized area usually over a bony prominence. (Active)  01/07/22 1848  Location: Coccyx  Location Orientation: Mid  Staging: Stage 1 -  Intact skin with non-blanchable redness of a localized area usually over a bony prominence.  Wound Description (Comments):   Present on Admission: Yes      Nutrition Problem: Severe Malnutrition Etiology: chronic illness (chronic pancreatitis, h/o multiple gastric surgeries including gastric bypass) Pressure Injury 01/07/22 Coccyx Mid Stage 1 -  Intact skin with non-blanchable redness of a localized area usually over a bony prominence. (Active)  01/07/22 1848  Location: Coccyx  Location Orientation: Mid  Staging: Stage 1 -  Intact skin with non-blanchable redness of a localized area usually over a bony prominence.  Wound Description (Comments):   Present on Admission: Yes  Dressing Type Foam - Lift dressing to assess site every shift 01/23/22 2257    DVT prophylaxis:     Code Status: Full Code  Family Communication: None at bedside  Patient status:Inpatient  Patient is from :Home  Anticipated discharge to:SNF  Estimated DC date: Stable to DC once bed available at SNF   Consultants: Palliative care, GI  Procedures:Thora  x2 , paracentesis   Antimicrobials:  Anti-infectives (From admission, onward)    Start     Dose/Rate Route Frequency Ordered Stop   01/09/22 1800  ceFAZolin (ANCEF) IVPB 1 g/50 mL premix        1 g 100 mL/hr over 30 Minutes Intravenous Every 8 hours 01/09/22 1513 01/16/22 1759   01/06/22 2030  cefTRIAXone (ROCEPHIN) 2 g in sodium chloride 0.9 % 100 mL IVPB  Status:  Discontinued       See Hyperspace for full Linked Orders Report.   2 g 200 mL/hr over 30 Minutes Intravenous Every 24 hours 01/06/22 2017 01/09/22 1513   01/06/22 2030  metroNIDAZOLE  (FLAGYL) IVPB 500 mg  Status:  Discontinued       See Hyperspace for full Linked Orders Report.   500 mg 100 mL/hr over 60 Minutes Intravenous Every 12 hours 01/06/22 2017 01/09/22 1513       Subjective: Patient denies any new complaints, just reports being a little tired.   Objective: Vitals:   01/23/22 0535 01/23/22 1307 01/23/22 2022 01/24/22 0453  BP: 121/77 122/76 113/74 124/86  Pulse: (!) 104 (!) 106 99 98  Resp: 17 20 16 16   Temp: 98 F (36.7 C) 98.4 F (36.9 C) 97.7 F (36.5 C) 98.2 F (36.8 C)  TempSrc: Oral Oral Oral Oral  SpO2: 100% 99% 99% 98%  Weight:      Height:        Intake/Output Summary (Last 24 hours) at 01/24/2022 1552 Last data filed at 01/24/2022 0700 Gross per 24 hour  Intake 120 ml  Output 650 ml  Net -530 ml   Filed Weights   01/06/22 1857  Weight: 49.9 kg    Examination: General: NAD, condition, chronically ill-appearing, cachectic Cardiovascular: S1, S2 present Respiratory: CTAB Abdomen: Soft, nontender, nondistended, bowel sounds present Musculoskeletal: No bilateral pedal edema noted Skin: Normal Psychiatry: Normal mood    Data Reviewed: I have personally reviewed following labs and imaging studies  CBC: Recent Labs  Lab 01/19/22 0328 01/20/22 0323 01/21/22 0228 01/22/22 0457 01/24/22 0305  WBC 4.5 4.7 6.8 5.3 8.1  HGB 8.0* 8.1* 8.4* 8.2* 8.4*  HCT 23.8* 25.3*  26.0* 26.0* 25.9*  MCV 93.0 95.1 96.7 96.7 95.9  PLT 123* 133* 173 177 209   Basic Metabolic Panel: Recent Labs  Lab 01/18/22 0258 01/19/22 0328 01/20/22 0323 01/21/22 0228 01/22/22 0457 01/24/22 0305  NA 136 135 134* 134* 135 133*  K 3.3* 3.4* 3.9 4.4 4.4 4.0  CL 97* 97* 97* 99 100 98  CO2 34* 35* 32 30 31 28   GLUCOSE 114* 122* 168* 130* 83 101*  BUN 12 10 10 11 10 11   CREATININE 0.66 0.66 0.59 0.54 0.47 0.56  CALCIUM 7.4* 7.3* 7.5* 7.7* 7.9* 7.6*  MG 1.6* 1.5* 1.6*  --   --   --   PHOS 3.4 2.9  --   --   --   --      No results found for  this or any previous visit (from the past 240 hour(s)).    Radiology Studies: No results found.  Scheduled Meds:  ascorbic acid  500 mg Oral BID   buprenorphine  2 patch Transdermal Weekly   Chlorhexidine Gluconate Cloth  6 each Topical Daily   docusate sodium  100 mg Oral BID   feeding supplement  237 mL Oral TID BM   furosemide  40 mg Oral Daily   lipase/protease/amylase  24,000 Units Oral TID WC   multivitamin with minerals  1 tablet Oral Daily   nortriptyline  30 mg Oral QHS   pantoprazole  40 mg Oral BID   potassium chloride  40 mEq Oral BID   silver sulfADIAZINE   Topical BID   sodium chloride flush  10-40 mL Intracatheter Q12H   sucralfate  1 g Oral TID WC & HS   vitamin A  50,000 Units Oral Daily   Vitamin D (Ergocalciferol)  50,000 Units Oral Q7 days   Continuous Infusions:     LOS: 17 days   , MD [facility] P11/02/2022, 3:52 PM

## 2022-01-24 NOTE — Progress Notes (Signed)
Mobility Specialist - Progress Note   01/24/22 1546  Mobility  Activity Ambulated with assistance in hallway  Level of Assistance Contact guard assist, steadying assist  Assistive Device Front wheel walker  Distance Ambulated (ft) 350 ft  Activity Response Tolerated well  Mobility Referral Yes  $Mobility charge 1 Mobility   Pt received in bed and agreeable to mobility. Pt mentioned feeling a little tired today, but no other complaints. Pt to bed after session with all needs met.     [facility]

## 2022-01-25 MED ORDER — POLYETHYLENE GLYCOL 3350 17 G PO PACK: 17.0000 g | PACK | Freq: Two times a day (BID) | ORAL | 0 refills | Status: DC | PRN

## 2022-01-25 MED ORDER — SILVER SULFADIAZINE 1 % EX CREA: TOPICAL_CREAM | Freq: Two times a day (BID) | CUTANEOUS | 0 refills | Status: DC

## 2022-01-25 MED ORDER — VITAMIN D (ERGOCALCIFEROL) 1.25 MG (50000 UNIT) PO CAPS: 50000.0000 [IU] | ORAL_CAPSULE | ORAL | 0 refills | Status: AC

## 2022-01-25 MED ORDER — POTASSIUM CHLORIDE CRYS ER 20 MEQ PO TBCR: 10.0000 meq | EXTENDED_RELEASE_TABLET | Freq: Every day | ORAL | Status: DC

## 2022-01-25 MED ORDER — PANTOPRAZOLE SODIUM 40 MG PO TBEC: 40.0000 mg | DELAYED_RELEASE_TABLET | Freq: Two times a day (BID) | ORAL | Status: DC

## 2022-01-25 MED ORDER — SUCRALFATE 1 GM/10ML PO SUSP: 1.0000 g | Freq: Three times a day (TID) | ORAL | 0 refills | Status: DC

## 2022-01-25 MED ORDER — DICYCLOMINE HCL 10 MG PO CAPS: 10.0000 mg | ORAL_CAPSULE | Freq: Three times a day (TID) | ORAL | Status: DC | PRN

## 2022-01-25 MED ORDER — TIZANIDINE HCL 2 MG PO TABS: 2.0000 mg | ORAL_TABLET | Freq: Two times a day (BID) | ORAL | 0 refills | Status: DC | PRN

## 2022-01-25 MED ORDER — ENSURE ENLIVE PO LIQD: 237.0000 mL | Freq: Three times a day (TID) | ORAL | 12 refills | Status: DC

## 2022-01-25 MED ORDER — BUPRENORPHINE 20 MCG/HR TD PTWK: 1.0000 | MEDICATED_PATCH | TRANSDERMAL | 0 refills | Status: AC

## 2022-01-25 MED ORDER — FUROSEMIDE 40 MG PO TABS: 40.0000 mg | ORAL_TABLET | Freq: Every day | ORAL | 0 refills | Status: DC

## 2022-01-25 MED ORDER — VITAMIN A 3 MG (10000 UNIT) PO CAPS: 50000.0000 [IU] | ORAL_CAPSULE | Freq: Every day | ORAL | Status: AC

## 2022-01-25 NOTE — TOC Progression Note (Addendum)
Transition of Care [facility]) - Progression Note    Patient Details  Name: Carolyn [name] MRN: [id number] Date of Birth: 1955-11-19  Transition of Care [facility]) CM/SW Contact  Golda Acre, RN Phone Number: 01/25/2022, 9:46 AM  Clinical Narrative:    Auth # [id number] for [employer] obtained.  Good from [id number]/. Md notified. Tct-whitney/ message left giving auth number. Requested return call at 0950. [facility]room 105 call report to [phone] when ready to be dcd.  Md notified and rn notified.  Tct-husband notified of move and dc today.  TCT husband willuse [facility].  Referral for outpt pallative care. Tct-Emily with authro care.  Referral done. Expected Discharge Plan and Services                                                 Social Determinants of Health (SDOH) Interventions    Readmission Risk Interventions   No data to display

## 2022-01-25 NOTE — TOC Transition Note (Signed)
Transition of Care [facility]) - CM/SW Discharge Note   Patient Details  Name: Carolyn [name] MRN: [id number] Date of Birth: [birth year]  Transition of Care [facility]) CM/SW Contact:  Golda Acre, RN Phone Number: 01/25/2022, 1:23 PM   Clinical Narrative:    Packet for transport to unit clerk on [address]. [facility]tar called for transport at 1330        Patient Goals and CMS Choice        Discharge Placement                       Discharge Plan and Services                                     Social Determinants of Health (SDOH) Interventions     Readmission Risk Interventions   No data to display

## 2022-01-25 NOTE — Progress Notes (Signed)
  Daily Progress Note   Patient Name: Carolyn [name]       Date: 01/25/2022 DOB: 1955/07/02  Age: 66 y.o. MRN#: [id number] Attending Physician: Briant Cedar, MD Primary Care Physician: Farris Has, MD Admit Date: 01/06/2022 Length of Stay: 18 days  Patient last seen by palliative RN on 01/23/11. Symptoms controlled at this time. Patient is hopeful to discharge to SNF with rehab soon. Goals have remained to continue full scope of therapies. As goals for medical care are currently determined, palliative care team will sign off. Please reach out if our team can be of further assistance in the future. Thank you for involving our team in patient's care.    This provider placed [facility] referral today to assist with palliative medicine follow upon discharge.   Alvester Morin, DO Palliative Care Provider PMT # [phone]

## 2022-01-25 NOTE — Discharge Summary (Signed)
Physician Discharge Summary   Patient: Carolyn [name] MRN: [id number] DOB: 06/15/1955  Admit date:     01/06/2022  Discharge date: 01/25/22  Discharge Physician: Briant Cedar   PCP: Farris Has, MD   Recommendations at discharge:   Follow-up with PCP in 1 week Follow-up with GI as scheduled  Discharge Diagnoses: Principal Problem:   Protein-calorie malnutrition, severe (HCC) Active Problems:   Chronic pancreatitis (HCC)   Hypokalemia   Hypomagnesemia   AKI (acute kidney injury) (HCC)   Chronic pain syndrome   Cellulitis of right foot   Normocytic anemia   Gastroesophageal reflux disease without esophagitis   Pressure injury of skin   Physical deconditioning   Acute respiratory failure with hypoxia (HCC)   Hypoalbuminemia   Anasarca   Alcoholic cirrhosis of liver without ascites (HCC)   Occult blood in stools   Portal hypertensive gastropathy (HCC)   Abnormal CT of the abdomen   Chronic gastric ulcer with perforation [facility])    Hospital Course: 66 year old female with history of chronic alcohol pancreatitis, chronic pain syndrome, GERD, severe malnutrition, osteoporosis who presented initially with right foot wound that started as a blister/bulla.  Right foot x-ray raised concern for acute osteomyelitis but was negative for MRI. Evaluated by podiatry who recommended antibiotics for 7 days, wound care and outpatient follow-up.  Patient developed abdominal fullness for which KUB was done which showed a pleural effusion.  Chest x-ray confirmed large right pleural effusion.  IR did thoracentesis  x2 . Palliative care consulted for goals of care who recommended outpatient follow-up at a skilled nursing facility. GI consulted for drop of hemoglobin with positive FOBT. PT/OT recommending SNF on discharge.    Assessment and Plan:  Acute hypoxic respiratory failure Improved- currently on RA Secondary to right pleural effusion, atelectasis Chest x-ray showed large pleural  effusion.  BNP was also elevated Echocardiogram showed EF of 70 to 75%, grade 1 diastolic dysfunction Albumin was very low. S/P thora  x 2 S/P IV Lasix and IV albumin Continue p.o. Lasix Follow up with PCP   Normocytic anemia Acute blood loss anemia Hemoglobin dropped to the range of 6, currently stable Anemia panel suggests anemia of chronic disease Positive  FOBT.  She was transfused with a unit of PRBC on 11/5 Consulted, s/p EGD on 11/7, which showed nonbleeding deeply cratered gastric ulcer, noted self-contained fistulous tract at the base of the ulcer which appeared to communicate with the duodenal bulb consistent with contained perforation General surgery was consulted, recommended no further management/imaging needed, recommend medical management Stomach biopsy showed minimal chronic gastritis with reactive epithelial changes negative for H. pylori, interstitial metaplasia or carcinoma Pantoprazole 40 mg p.o. twice daily for 10 weeks then switch to 40 mg daily, sucralfate 1 g p.o. 4 times daily for 6 weeks Recommend repeat endoscopy in 8 weeks to evaluate ulcer for healing   Anasarca/hypoalbuminemia Noted dependent edema.  Likely nutritional.  Nutritionist consulted and following   Bilateral feet cellulitis/ulceration X-ray of right foot raise concern for cellulitis but osteomyelitis ruled out by MRI No plan for surgery, podiatry recommended 7 days course of antibiotics, wound care.  She completed ancef on 10/4 Continue pain management, supportive care   Abdominal cramps: Continue Bentyl   Low magnesium/potassium/phosphorus: Continue to monitor and supplement as needed.     Chronic alcohol use associated cirrhosis Pancreatitis/pancreatic insufficiency/chronic pain syndrome: Sober over a year. Continue home Butrans, Creon   Vitamin D/A insufficiency: Being supplemented   Physical deconditioning/ambulatory dysfunction: PT/OT  recommended SNF   Severe protein calorie  malnutrition: nutrition is consulted.  BMI of 16.7.  This is likely associated with chronic pancreatitis, multiple abdominal surgeries   Goals of care: Palliative care also consulted here.  Recommend outpatient follow-up at the SNF   Pressure Injury 01/07/22 Coccyx Mid Stage 1 -  Intact skin with non-blanchable redness of a localized area usually over a bony prominence. (Active)  01/07/22 1848  Location: Coccyx  Location Orientation: Mid  Staging: Stage 1 -  Intact skin with non-blanchable redness of a localized area usually over a bony prominence.  Wound Description (Comments):   Present on Admission: Yes        Nutrition Problem: Severe Malnutrition Etiology: chronic illness (chronic pancreatitis, h/o multiple gastric surgeries including gastric bypass)     Pressure Injury 01/07/22 Coccyx Mid Stage 1 -  Intact skin with non-blanchable redness of a localized area usually over a bony prominence. (Active)  01/07/22 1848  Location: Coccyx  Location Orientation: Mid  Staging: Stage 1 -  Intact skin with non-blanchable redness of a localized area usually over a bony prominence.  Wound Description (Comments):   Present on Admission: Yes  Dressing Type Foam - Lift dressing to assess site every shift          Consultants: GI, Palliative  Procedures performed: Thoracentesis X 2, paracentesis Disposition: Skilled nursing facility Diet recommendation: General   Discharge Diet Orders (From admission, onward)     Start     Ordered   01/09/22 0000  Diet general        01/09/22 0752            DISCHARGE MEDICATION: Allergies as of 01/25/2022       Reactions   Peanut-containing Drug Products Anaphylaxis   Tree nuts        Medication List     STOP taking these medications    ibuprofen 200 MG tablet Commonly known as: ADVIL       TAKE these medications    ascorbic acid 500 MG tablet Commonly known as: VITAMIN C Take 1 tablet (500 mg total) by mouth 2 (two)  times daily. What changed:  medication strength how much to take when to take this   buprenorphine 20 MCG/HR Ptwk Commonly known as: BUTRANS Place 1 patch onto the skin once a week for 1 dose.   calcium carbonate 1250 (500 Ca) MG tablet Commonly known as: OS-CAL - dosed in mg of elemental calcium Take 1 tablet by mouth daily.   dicyclomine 10 MG capsule Commonly known as: BENTYL Take 1 capsule (10 mg total) by mouth every 8 (eight) hours as needed for spasms (Abd cramps).   famotidine 10 MG tablet Commonly known as: PEPCID Take 10 mg by mouth 2 (two) times daily.   feeding supplement Liqd Take 237 mLs by mouth 3 (three) times daily between meals.   furosemide 40 MG tablet Commonly known as: LASIX Take 1 tablet (40 mg total) by mouth daily. Start taking on: January 26, 2022   Hyoscyamine Sulfate SL 0.125 MG Subl Commonly known as: Levsin/SL Place 1 each under the tongue 4 (four) times daily as needed for up to 5 days.   lidocaine 2 % solution Commonly known as: XYLOCAINE Use as directed 15 mLs in the mouth or throat every 6 (six) hours as needed for mouth pain.   lipase/protease/amylase 12000-38000 units Cpep capsule Commonly known as: Creon Take 2 capsules (24,000 Units total) by mouth 3 (three) times daily  with meals.   multivitamin with minerals Tabs tablet Take 1 tablet by mouth daily.   nortriptyline 10 MG capsule Commonly known as: PAMELOR Take 30 mg by mouth at bedtime.   ondansetron 4 MG tablet Commonly known as: ZOFRAN Take 4 mg by mouth every 8 (eight) hours as needed for nausea or vomiting.   pantoprazole 40 MG tablet Commonly known as: PROTONIX Take 1 tablet (40 mg total) by mouth 2 (two) times daily.   polyethylene glycol 17 g packet Commonly known as: MIRALAX / GLYCOLAX Take 17 g by mouth 2 (two) times daily as needed for mild constipation.   potassium chloride SA 20 MEQ tablet Commonly known as: KLOR-CON M Take 0.5 tablets (10 mEq total)  by mouth daily.   silver sulfADIAZINE 1 % cream Commonly known as: SILVADENE Apply topically 2 (two) times daily.   sucralfate 1 GM/10ML suspension Commonly known as: CARAFATE Take 10 mLs (1 g total) by mouth 4 (four) times daily -  with meals and at bedtime.   tiZANidine 2 MG tablet Commonly known as: ZANAFLEX Take 1 tablet (2 mg total) by mouth every 12 (twelve) hours as needed for muscle spasms. What changed: when to take this   vitamin A 3 MG (10000 UNITS) capsule Take 5 capsules (50,000 Units total) by mouth daily for 5 days. Start taking on: January 26, 2022   vitamin B-12 100 MCG tablet Commonly known as: CYANOCOBALAMIN Take 100 mcg by mouth daily.   Vitamin D (Ergocalciferol) 1.25 MG (50000 UNIT) Caps capsule Commonly known as: DRISDOL Take 1 capsule (50,000 Units total) by mouth every 7 (seven) days for 3 doses. Takes every Wednesday Start taking on: January 27, 2022   zinc sulfate 220 (50 Zn) MG capsule Take 1 capsule (220 mg total) by mouth daily.               Discharge Care Instructions  (From admission, onward)           Start     Ordered   01/09/22 0000  Discharge wound care:       Comments: Wound care  Every shift      Aquacel Ag to RT 2nd toe ulcer, LT medial ankle ulcer. Silvadene cream to RT plantar arch blister. Daily w/ DSD.   01/09/22 8295            Follow-up Information     Farris Has, MD. Schedule an appointment as soon as possible for a visit in 1 week(s).   Specialty: Family Medicine Contact information: [address] [place] [address] [phone]                Discharge Exam: Ceasar Mons Weights   01/06/22 1857  Weight: 49.9 kg   General: NAD, chronically ill-appearing, cachectic    Cardiovascular: S1, S2 present Respiratory: CTAB Abdomen: Soft, nontender, nondistended, bowel sounds present Musculoskeletal: No bilateral pedal edema noted Skin: Normal Psychiatry: Normal mood    Condition at discharge: stable  The results of significant diagnostics from this hospitalization (including imaging, microbiology, ancillary and laboratory) are listed below for reference.   Imaging Studies: US Abdomen Complete  Result Date: 01/19/2022 CLINICAL DATA:  Cirrhosis. EXAM: ABDOMEN ULTRASOUND COMPLETE COMPARISON:  05/21/2013 FINDINGS: Gallbladder: Surgically absent. Common bile duct: Diameter: 13 mm in the hepatoduodenal ligament. Liver: Nodular liver contour suggests cirrhosis. No focal mass lesion evident. Portal vein is patent on color Doppler imaging with normal direction of blood flow towards the liver. IVC: No  abnormality visualized. Pancreas: Partially obscured by bowel gas. No evidence for main duct dilatation. Spleen: Size and appearance within normal limits. Right Kidney: Length: 9.4 cm prominent right renal pelvis, similar to prior. Echogenicity within normal limits. No mass or hydronephrosis visualized. Left Kidney: Length: 10.6 cm. Echogenicity within normal limits. No mass or hydronephrosis visualized. Abdominal aorta: No aneurysm visualized. Other findings: Free fluid is seen around the liver with evidence of right pleural effusion. IMPRESSION: 1. Nodular liver contour suggests cirrhosis. No focal mass lesion identified. 2. Common bile duct measures 13 mm in the hepatoduodenal ligament. This may be related to prior cholecystectomy but correlation with liver function test recommended. 3. Perihepatic ascites. 4. Right pleural effusion. Electronically Signed   By: Kennith Center M.D.   On: 01/19/2022 08:53   US THORACENTESIS ASP PLEURAL SPACE W/IMG GUIDE  Result Date: 01/17/2022 INDICATION: Patient with severe protein calorie malnutrition, acute respiratory failure related to right pleural effusion. Request is made for therapeutic right thoracentesis. EXAM: ULTRASOUND GUIDED RIGHT THORACENTESIS MEDICATIONS: 10 mL% lidocaine COMPLICATIONS: None immediate. PROCEDURE: An ultrasound  guided thoracentesis was thoroughly discussed with the patient and questions answered. The benefits, risks, alternatives and complications were also discussed. The patient understands and wishes to proceed with the procedure. Written consent was obtained. Ultrasound was performed to localize and mark an adequate pocket of fluid in the right chest. The area was then prepped and draped in the normal sterile fashion. 1% Lidocaine was used for local anesthesia. Under ultrasound guidance a 6 Fr Safe-T-Centesis catheter was introduced. Thoracentesis was performed. The catheter was removed and a dressing applied. FINDINGS: A total of approximately 800 mL of yellow fluid was removed. IMPRESSION: Successful ultrasound guided right thoracentesis yielding 800 mL of pleural fluid. Read by: Loyce Dys PA-C Electronically Signed   By: Simonne Come M.D.   On: 01/17/2022 13:07   DG Chest Port 1 View  Result Date: 01/17/2022 CLINICAL DATA:  Status post thoracentesis. EXAM: PORTABLE CHEST 1 VIEW COMPARISON:  01/16/2022 FINDINGS: Interval decrease in right pleural effusion. No evidence for pneumothorax. Streaky opacity at the lung bases suggest atelectasis. The cardiopericardial silhouette is within normal limits for size. Bones are demineralized. Right PICC line tip overlies the mid to distal SVC level. IMPRESSION: Interval decrease in right pleural effusion without evidence for pneumothorax. Electronically Signed   By: Kennith Center M.D.   On: 01/17/2022 11:00   DG CHEST PORT 1 VIEW  Result Date: 01/16/2022 CLINICAL DATA:  Shortness of breath. EXAM: PORTABLE CHEST 1 VIEW COMPARISON:  Radiograph 01/12/2022. FINDINGS: Right upper extremity PICC tip at the atrial caval junction. Increasing right pleural effusion at least moderate in size. Hazy opacity at the right lung base likely represents associated atelectasis. There is a small left pleural effusion. Improving left upper lobe airspace disease from prior exam. There are  streaky right upper lobe opacities. No pneumothorax. The heart is upper normal in size. Stable mediastinal contours. IMPRESSION: 1. Increasing right pleural effusion at least moderate in size. Hazy opacity at the right lung base likely represents associated atelectasis. 2. Small left pleural effusion. 3. Improving left upper lobe airspace disease. Streaky right upper lobe opacities may be atelectasis or pneumonia. Electronically Signed   By: Narda Rutherford M.D.   On: 01/16/2022 12:18   ECHOCARDIOGRAM COMPLETE  Result Date: 01/15/2022    ECHOCARDIOGRAM REPORT   Patient Name:   Carolyn [name] Date of Exam: 01/15/2022 Medical Rec #:  [id number]      Height:  68.0 in Accession #:    [id number]     Weight:       110.0 lb Date of Birth:  May 16, 1955      BSA:          1.585 m Patient Age:    66 years       BP:           99/57 mmHg Patient Gender: F              HR:           94 bpm. Exam Location:  Inpatient Procedure: 2D Echo, Color Doppler and Cardiac Doppler Indications:    Other Abnormalities of the Heart R00.8  History:        Patient has no prior history of Echocardiogram examinations.                 Risk Factors:ETOH.  Sonographer:    Irving BurtonEmily Senior RDCS Referring Phys: [id number] Boyce MediciAYE T GONFA IMPRESSIONS  1. Vigorous LV systolic function; proximal septal thickening with systolic anterior motion of mitral valve with LVOT gradient of 3.5 m/s; suggest cardiac MRI to R/O HCM.  2. Left ventricular ejection fraction, by estimation, is 70 to 75%. The left ventricle has hyperdynamic function. The left ventricle has no regional wall motion abnormalities. There is mild left ventricular hypertrophy of the basal-septal segment. Left ventricular diastolic parameters are consistent with Grade I diastolic dysfunction (impaired relaxation).  3. Right ventricular systolic function is normal. The right ventricular size is normal. There is normal pulmonary artery systolic pressure.  4. The mitral valve is normal in structure.  Trivial mitral valve regurgitation. No evidence of mitral stenosis.  5. The aortic valve is tricuspid. Aortic valve regurgitation is trivial. Aortic valve sclerosis is present, with no evidence of aortic valve stenosis.  6. The inferior vena cava is normal in size with greater than 50% respiratory variability, suggesting right atrial pressure of 3 mmHg. Comparison(s): No prior Echocardiogram. FINDINGS  Left Ventricle: Left ventricular ejection fraction, by estimation, is 70 to 75%. The left ventricle has hyperdynamic function. The left ventricle has no regional wall motion abnormalities. The left ventricular internal cavity size was normal in size. There is mild left ventricular hypertrophy of the basal-septal segment. Left ventricular diastolic parameters are consistent with Grade I diastolic dysfunction (impaired relaxation). Right Ventricle: The right ventricular size is normal. Right ventricular systolic function is normal. There is normal pulmonary artery systolic pressure. The tricuspid regurgitant velocity is 2.21 m/s, and with an assumed right atrial pressure of 3 mmHg,  the estimated right ventricular systolic pressure is 22.5 mmHg. Left Atrium: Left atrial size was normal in size. Right Atrium: Right atrial size was normal in size. Pericardium: There is no evidence of pericardial effusion. Mitral Valve: The mitral valve is normal in structure. Mild mitral annular calcification. Trivial mitral valve regurgitation. No evidence of mitral valve stenosis. Tricuspid Valve: The tricuspid valve is normal in structure. Tricuspid valve regurgitation is trivial. No evidence of tricuspid stenosis. Aortic Valve: The aortic valve is tricuspid. Aortic valve regurgitation is trivial. Aortic valve sclerosis is present, with no evidence of aortic valve stenosis. Aortic valve peak gradient measures 13.8 mmHg. Pulmonic Valve: The pulmonic valve was normal in structure. Pulmonic valve regurgitation is trivial. No evidence of  pulmonic stenosis. Aorta: The aortic root is normal in size and structure. Venous: The inferior vena cava is normal in size with greater than 50% respiratory variability, suggesting right atrial pressure of 3 mmHg.  IAS/Shunts: No atrial level shunt detected by color flow Doppler. Additional Comments: Vigorous LV systolic function; proximal septal thickening with systolic anterior motion of mitral valve with LVOT gradient of 3.5 m/s; suggest cardiac MRI to R/O HCM.  LEFT VENTRICLE PLAX 2D LVIDd:         3.10 cm   Diastology LVIDs:         1.95 cm   LV e' medial:    5.55 cm/s LV PW:         0.80 cm   LV E/e' medial:  10.4 LV IVS:        1.00 cm   LV e' lateral:   10.20 cm/s LVOT diam:     2.10 cm   LV E/e' lateral: 5.7 LV SV:         112 LV SV Index:   71 LVOT Area:     3.46 cm  RIGHT VENTRICLE RV S prime:     17.50 cm/s TAPSE (M-mode): 2.0 cm LEFT ATRIUM             Index        RIGHT ATRIUM           Index LA diam:        3.00 cm 1.89 cm/m   RA Area:     13.30 cm LA Vol (A2C):   36.4 ml 22.96 ml/m  RA Volume:   29.50 ml  18.61 ml/m LA Vol (A4C):   33.3 ml 21.01 ml/m LA Biplane Vol: 34.9 ml 22.02 ml/m  AORTIC VALVE AV Area (Vmax): 3.67 cm AV Vmax:        186.00 cm/s AV Peak Grad:   13.8 mmHg LVOT Vmax:      197.00 cm/s LVOT Vmean:     159.000 cm/s LVOT VTI:       0.324 m  AORTA Ao Root diam: 3.10 cm Ao Asc diam:  3.50 cm MITRAL VALVE                TRICUSPID VALVE MV Area (PHT): 5.46 cm     TR Peak grad:   19.5 mmHg MV Decel Time: 139 msec     TR Vmax:        221.00 cm/s MV E velocity: 57.80 cm/s MV A velocity: 118.00 cm/s  SHUNTS MV E/A ratio:  0.49         Systemic VTI:  0.32 m                             Systemic Diam: 2.10 cm Olga Millers MD Electronically signed by Olga Millers MD Signature Date/Time: 01/15/2022/8:27:30 AM    Final    US THORACENTESIS ASP PLEURAL SPACE W/IMG GUIDE  Result Date: 01/12/2022 INDICATION: Patient with a right pleural effusion of unknown etiology. Interventional  radiology asked to perform a diagnostic and therapeutic thoracentesis. EXAM: ULTRASOUND GUIDED THORACENTESIS MEDICATIONS: 1% lidocaine 10 mL COMPLICATIONS: None immediate. PROCEDURE: An ultrasound guided thoracentesis was thoroughly discussed with the patient and questions answered. The benefits, risks, alternatives and complications were also discussed. The patient understands and wishes to proceed with the procedure. Written consent was obtained. Ultrasound was performed to localize and mark an adequate pocket of fluid in the right chest. The area was then prepped and draped in the normal sterile fashion. 1% Lidocaine was used for local anesthesia. Under ultrasound guidance a 6 Fr Safe-T-Centesis catheter was introduced. Thoracentesis was performed. The catheter was  removed and a dressing applied. FINDINGS: A total of approximately 1.2 L of hazy yellow/white fluid was removed. Samples were sent to the laboratory as requested by the clinical team. IMPRESSION: Successful ultrasound guided RIGHT thoracentesis yielding 1.2 L of pleural fluid. Read by: Alwyn Ren, NP Electronically Signed   By: Roanna Banning M.D.   On: 01/12/2022 19:07   DG CHEST PORT 1 VIEW  Result Date: 01/12/2022 CLINICAL DATA:  Status post right thoracentesis EXAM: PORTABLE CHEST 1 VIEW COMPARISON:  Previous studies including the chest radiographs done on 01/10/2022 FINDINGS: There is marked decrease in right pleural effusion. Small bilateral pleural effusions are seen in the current study. There is no pneumothorax. Infiltrates are seen in both lungs, more so on the left side. There is interval worsening of infiltrate in left upper lung field. Deformity in the posterolateral aspect of right ninth rib may suggest healed or healing fracture. IMPRESSION: There is marked decrease in right pleural effusion. There is no pneumothorax. Infiltrates are seen in both lungs, more so on the left side suggesting multifocal pneumonia or asymmetric  pulmonary edema. There is interval increase in alveolar densities in left upper lung field. Small bilateral pleural effusions are seen. Electronically Signed   By: Ernie Avena M.D.   On: 01/12/2022 12:06   [place] EKG SITE RITE  Result Date: 01/12/2022 If Site Rite image not attached, placement could not be confirmed due to current cardiac rhythm.  DG Chest 2 View  Result Date: 01/10/2022 CLINICAL DATA:  Pleural effusion. EXAM: CHEST - 2 VIEW COMPARISON:  06/13/2020 FINDINGS: Large right pleural effusion associated with collapse/consolidation in the right base. There is left parahilar airspace disease with retrocardiac atelectasis or infiltrate and small left pleural effusion. The cardio pericardial silhouette is enlarged. The visualized bony structures of the thorax are unremarkable. Telemetry leads overlie the chest. IMPRESSION: 1. Large right pleural effusion associated with collapse/consolidation in the right base. 2. Left parahilar airspace disease with retrocardiac atelectasis or infiltrate and small left pleural effusion. Imaging features may reflect multifocal pneumonia. Asymmetric pulmonary edema would also be a consideration. Chest CT may prove helpful to further evaluate. Electronically Signed   By: Kennith Center M.D.   On: 01/10/2022 13:47   DG Abd Portable 1V  Result Date: 01/10/2022 CLINICAL DATA:  Abdominal discomfort for 3 days. No bowel movement. EXAM: PORTABLE ABDOMEN - 1 VIEW COMPARISON:  Recent renal ultrasound reviewed. FINDINGS: There is increased air throughout nondilated small bowel in the central abdomen. Air within the ascending and transverse colon that is not abnormally dilated. No significant formed stool in the colon. Multiple surgical clips and enteric sutures in the upper abdomen. No evidence of free air in the supine views. No visible radiopaque calculi. Right pleural effusion is at least moderate in size, partially included in the field of view. IMPRESSION: 1.  Increased air throughout nondilated small bowel in the central abdomen, suggesting generalized ileus. No significant formed stool in the colon. 2. Right pleural effusion is at least moderate in size, partially included in the field of view. Electronically Signed   By: Narda Rutherford M.D.   On: 01/10/2022 10:39   VAS [place] ABI WITH/WO TBI  Result Date: 01/09/2022  LOWER EXTREMITY DOPPLER STUDY Patient Name:  Carolyn [name]  Date of Exam:   01/08/2022 Medical Rec #: [id number]       Accession #:    [id number] Date of Birth: April 14, 1955       Patient Gender: F Patient Age:  66 years Exam Location:  [facility] Procedure:      VAS [place] ABI WITH/WO TBI Referring Phys: Margie Ege --------------------------------------------------------------------------------  Indications: Ulceration. High Risk Factors: None.  Comparison Study: No prior studies. Performing Technologist: Jean Rosenthal RDMS, RVT  Examination Guidelines: A complete evaluation includes at minimum, Doppler waveform signals and systolic blood pressure reading at the level of bilateral brachial, anterior tibial, and posterior tibial arteries, when vessel segments are accessible. Bilateral testing is considered an integral part of a complete examination. Photoelectric Plethysmograph (PPG) waveforms and toe systolic pressure readings are included as required and additional duplex testing as needed. Limited examinations for reoccurring indications may be performed as noted.  ABI Findings: +--------+------------------+-----+---------+--------+ Right   Rt Pressure (mmHg)IndexWaveform Comment  +--------+------------------+-----+---------+--------+ [id number]                    triphasic         +--------+------------------+-----+---------+--------+ PTA     166               1.23 triphasic         +--------+------------------+-----+---------+--------+ DP      153               1.13 triphasic          +--------+------------------+-----+---------+--------+ +--------+------------------+-----+---------+-------+ Left    Lt Pressure (mmHg)IndexWaveform Comment +--------+------------------+-----+---------+-------+ [id number]                    triphasic        +--------+------------------+-----+---------+-------+ PTA     163               1.21 triphasic        +--------+------------------+-----+---------+-------+ DP      163               1.21 triphasic        +--------+------------------+-----+---------+-------+  Summary: Right: Resting right ankle-brachial index is within normal range. Left: Resting left ankle-brachial index is within normal range. *See table(s) above for measurements and observations.  Electronically signed by Heath Lark on 01/09/2022 at 10:40:55 AM.    Final    [place] EKG SITE RITE  Result Date: 01/08/2022 If Site Rite image not attached, placement could not be confirmed due to current cardiac rhythm.  US RENAL  Result Date: 01/07/2022 CLINICAL DATA:  AKI. EXAM: RENAL / URINARY TRACT ULTRASOUND COMPLETE COMPARISON:  05/21/2013, 11/03/2012. FINDINGS: Right Kidney: Renal measurements: 8.9 x 4.0 x 5.8 cm = volume: 109.0 mL. Echogenicity within normal limits. The renal pelvis is distended. Left Kidney: Renal measurements: 9.9 x 4.5 x 4.6 cm = volume: 106.8 mL. Echogenicity within normal limits. The renal pelvis is distended. Bladder: Appears normal for degree of bladder distention. Other: The liver has a slightly nodular contour.  Mild-to-moderate ascites. IMPRESSION: 1. Mildly distended renal pelvises bilaterally, which may represent extrarenal pelvises or mild hydronephrosis. 2. Mild-to-moderate ascites. 3. Nodular contour of the liver, possible underlying cirrhosis. Electronically Signed   By: Thornell Sartorius M.D.   On: 01/07/2022 21:13   DG Foot Complete Left  Result Date: 01/07/2022 CLINICAL DATA:  Open toe wound. EXAM: LEFT FOOT - COMPLETE 3+ VIEW COMPARISON:   None Available. FINDINGS: No acute fracture or dislocation. No bony erosion or periosteal elevation. Degenerative changes are noted at the first metatarsophalangeal joint. No soft tissue abnormality. IMPRESSION: No acute osseous abnormality. Electronically Signed   By: Thornell Sartorius M.D.   On: 01/07/2022 21:09   MR FOOT RIGHT WO  CONTRAST  Result Date: 01/07/2022 CLINICAL DATA:  Clinical concern for osteomyelitis of the 2nd digit. EXAM: MRI OF THE RIGHT FOREFOOT WITHOUT CONTRAST TECHNIQUE: Multiplanar, multisequence MR imaging of the right forefoot was performed. No intravenous contrast was administered. COMPARISON:  Radiographs dated January 06, 2022 FINDINGS: Bones/Joint/Cartilage There is focal marrow edema of the distal phalanx of the first digit, which without evidence of adjacent skin defect could be secondary to bone contusion or abnormal stress. Marrow signal within the second as well as remaining digits is within normal limits. Degenerative changes at the first metatarsophalangeal joint and first interphalangeal joint. Ligaments Lisfranc and collateral ligaments are intact. Muscles and Tendons Generalized edema about the plantar muscles and tendon, which may be secondary to diabetic myopathy/myositis. No fluid collection or hematoma. Soft tissues Subcutaneous soft tissue edema about the dorsum of the foot without evidence of fluid collection or abscess. IMPRESSION: 1. Focal marrow edema of the distal phalanx of the first digit, which without evidence of adjacent skin defect could be secondary to bone contusion or abnormal stress. 2. No MR evidence of osteomyelitis of the second toe. 3. Generalized edema about the plantar muscles and tendon, which may be secondary to diabetic myopathy/myositis. 4. Subcutaneous soft tissue edema about the dorsum of the foot without evidence of fluid collection or abscess. Electronically Signed   By: Larose Hires D.O.   On: 01/07/2022 11:46   [place] EKG SITE RITE  Result  Date: 01/07/2022 If Site Rite image not attached, placement could not be confirmed due to current cardiac rhythm.  DG Foot Complete Right  Result Date: 01/06/2022 CLINICAL DATA:  Clinical concern for osteomyelitis of the second toe. EXAM: RIGHT FOOT COMPLETE - 3+ VIEW COMPARISON:  None Available. FINDINGS: There is diffuse decreased bone mineralization. Mild interphalangeal joint space narrowing diffusely. Mild-to-moderate third and mild rest of the tarsometatarsals joint space narrowing. There is moderate distal second toe soft tissue swelling. There is mild erosion of the distal, minimally medial aspect of the distal phalanx of the second toe concerning for acute osteomyelitis. No acute fracture is seen.  No dislocation. IMPRESSION: Moderate distal second toe soft tissue swelling. Mild erosion of the distal, minimally medial aspect of the distal phalanx of the second toe concerning for acute osteomyelitis. Electronically Signed   By: Neita Garnet M.D.   On: 01/06/2022 20:12    Microbiology: Results for orders placed or performed during the hospital encounter of 01/06/22  Culture, blood (routine x 2)     Status: None   Collection Time: 01/06/22  8:25 PM   Specimen: BLOOD  Result Value Ref Range Status   Specimen Description   Final    BLOOD RIGHT ANTECUBITAL Performed at [facility], [address] [place], [place] [address]    Special Requests   Final    BOTTLES DRAWN AEROBIC AND ANAEROBIC Blood Culture adequate volume Performed at [facility], [address] [place], [place] [address]    Culture   Final    NO GROWTH 5 DAYS Performed at [facility], [address], [place], [place] [address]    Report Status 01/11/2022 FINAL  Final  Culture, blood (routine x 2)     Status: None   Collection Time: 01/07/22  6:51 PM   Specimen: BLOOD  Result Value Ref Range Status   Specimen Description   Final    BLOOD SITE NOT SPECIFIED Performed at [facility], [address]  [place], [place] [address]    Special Requests   Final    BOTTLES DRAWN AEROBIC ONLY Blood Culture adequate volume Performed at [facility], [address] [place], [place] [address]    Culture   Final    NO GROWTH 5 DAYS Performed at [facility], [address], [place], [place] [address]    Report Status 01/12/2022 FINAL  Final  Culture, blood (Routine X 2) w Reflex to ID Panel     Status: None   Collection Time: 01/07/22  6:51 PM   Specimen: BLOOD  Result Value Ref Range Status   Specimen Description   Final    BLOOD SITE NOT SPECIFIED Performed at [facility], [address], [place], [place] [address]    Special Requests   Final    BOTTLES DRAWN AEROBIC ONLY Blood Culture adequate volume Performed at [facility], [address], [place], [place] [address]    Culture   Final    NO GROWTH 5 DAYS Performed at [facility], [address], [place], [place] [address]    Report Status 01/12/2022 FINAL  Final  Body fluid culture w Gram Stain     Status: None   Collection Time: 01/12/22 12:30 PM   Specimen: Peritoneal Washings  Result Value Ref Range Status   Specimen Description   Final    PERITONEAL Performed at [facility], [address] [place], [place] [address]    Special Requests   Final    NONE Performed at [facility], [address], [place], [place] [address]    Gram Stain   Final    RARE WBC PRESENT,BOTH PMN AND MONONUCLEAR NO ORGANISMS SEEN    Culture   Final    NO GROWTH 3 DAYS Performed at [facility], [address], [place], [place] [address]    Report Status 01/16/2022 FINAL  Final    Labs: CBC: Recent Labs  Lab 01/19/22 0328 01/20/22 0323 01/21/22 0228 01/22/22 0457 01/24/22 0305  WBC 4.5 4.7 6.8 5.3 8.1  HGB 8.0* 8.1* 8.4* 8.2* 8.4*  HCT 23.8* 25.3* 26.0* 26.0* 25.9*  MCV 93.0  95.1 96.7 96.7 95.9  PLT 123* 133* 173 177 209   Basic Metabolic Panel: Recent Labs  Lab 01/19/22 0328 01/20/22 0323 01/21/22 0228 01/22/22 0457 01/24/22 0305  NA 135 134* 134* 135 133*  K 3.4* 3.9 4.4 4.4 4.0  CL 97* 97* 99 100 98  CO2 35* 32 30 31 28   GLUCOSE 122* 168* 130* 83 101*  BUN 10 10 11 10 11   CREATININE 0.66 0.59 0.54 0.47 0.56  CALCIUM 7.3* 7.5* 7.7* 7.9* 7.6*  MG 1.5* 1.6*  --   --   --   PHOS 2.9  --   --   --   --    Liver Function Tests: Recent Labs  Lab 01/19/22 0328 01/21/22 0228 01/22/22 0457  AST 26 35 36  ALT 9 13 14   ALKPHOS 122 139* 139*  BILITOT 0.9 0.6 0.6  PROT 4.0* 4.6* 4.4*  ALBUMIN 2.2* 2.2* 2.1*   CBG: No results for input(s): "GLUCAP" in the last 168 hours.  Discharge time spent: greater than 30 minutes.  Signed: 13/09/23, MD [facility] 01/25/2022

## 2022-01-25 NOTE — Progress Notes (Signed)
Physical Therapy Treatment Patient Details Name: Carolyn [name] MRN: [id number] DOB: 21-May-1955 Today's Date: 01/25/2022   History of Present Illness 66 year old female with history of chronic pancreatitis, chronic pain syndrome, GERD, severe malnutrition, osteoporosis who presented initially with right foot wound that started as a blister/bulla.  Right foot x-ray raised concern for acute osteomyelitis but was negative for MRI.  Chest x-ray confirmed large right pleural effusion.  IR did thoracentesis x2 .    PT Comments    Pt progressing well with mobility and performed a few exercises.  Pt anticipates d/c soon.  Pt eager to go to SNF for rehab and become more independent and stronger in preparation for eventual return home.    Recommendations for follow up therapy are one component of a multi-disciplinary discharge planning process, led by the attending physician.  Recommendations may be updated based on patient status, additional functional criteria and insurance authorization.  Follow Up Recommendations  Skilled nursing-short term rehab (<3 hours/day) Can patient physically be transported by private vehicle: Yes   Assistance Recommended at Discharge Frequent or constant Supervision/Assistance  Patient can return home with the following A little help with walking and/or transfers;A little help with bathing/dressing/bathroom;Assistance with cooking/housework;Assist for transportation;Help with stairs or ramp for entrance   Equipment Recommendations  Other (comment) (next venue)    Recommendations for Other Services       Precautions / Restrictions Precautions Precautions: Fall Other Brace: bilateral "post op" shoes Restrictions Other Position/Activity Restrictions: WBAT per podiatry progress note     Mobility  Bed Mobility Overal bed mobility: Needs Assistance Bed Mobility: Supine to Sit     Supine to sit: Supervision     General bed mobility comments: reliant on rail  and UE support    Transfers Overall transfer level: Needs assistance Equipment used: Rolling walker (2 wheels) Transfers: Sit to/from Stand Sit to Stand: Min guard, From elevated surface           General transfer comment: min/guard for elevated surface, cues for UE positioning    Ambulation/Gait Ambulation/Gait assistance: Min guard Gait Distance (Feet): 400 Feet Assistive device: Rolling walker (2 wheels) Gait Pattern/deviations: Step-through pattern, Decreased stride length, Trunk flexed, Narrow base of support Gait velocity: decreased     General Gait Details: verbal cues for RW positioning, posture, narrow BOS especially with turning; HR 116 bpm   Stairs             Wheelchair Mobility    Modified Rankin (Stroke Patients Only)       Balance Overall balance assessment: History of Falls, Needs assistance         Standing balance support: Bilateral upper extremity supported, Reliant on assistive device for balance, During functional activity Standing balance-Leahy Scale: Poor                              Cognition Arousal/Alertness: Awake/alert Behavior During Therapy: WFL for tasks assessed/performed Overall Cognitive Status: Within Functional Limits for tasks assessed                                          Exercises General Exercises - Lower Extremity Ankle Circles/Pumps: AROM, 10 reps, Both, Seated Long Arc Quad: AROM, Both, 10 reps, Seated Hip Flexion/Marching: AROM, Both, 10 reps, Seated    General Comments  Pertinent Vitals/Pain Pain Assessment Pain Assessment: No/denies pain    Home Living                          Prior Function            PT Goals (current goals can now be found in the care plan section) Acute Rehab PT Goals PT Goal Formulation: With patient Time For Goal Achievement: 02/05/22 Potential to Achieve Goals: Good Progress towards PT goals: Progressing toward  goals    Frequency    Min 2X/week      PT Plan Current plan remains appropriate    Co-evaluation              AM-PAC PT "6 Clicks" Mobility   Outcome Measure  Help needed turning from your back to your side while in a flat bed without using bedrails?: A Little Help needed moving from lying on your back to sitting on the side of a flat bed without using bedrails?: A Little Help needed moving to and from a bed to a chair (including a wheelchair)?: A Little Help needed standing up from a chair using your arms (e.g., wheelchair or bedside chair)?: A Little Help needed to walk in hospital room?: A Little Help needed climbing 3-5 steps with a railing? : A Lot 6 Click Score: 17    End of Session Equipment Utilized During Treatment: Gait belt Activity Tolerance: Patient tolerated treatment well Patient left: in chair;with call bell/phone within reach;with chair alarm set Nurse Communication: Mobility status PT Visit Diagnosis: Other abnormalities of gait and mobility (R26.89)     Time: 5427-0623 PT Time Calculation (min) (ACUTE ONLY): 17 min  Charges:  $Gait Training: 8-22 mins                    Paulino Door, DPT Physical Therapist Acute Rehabilitation Services Preferred contact method: Secure Chat Weekend Pager Only: [phone] Office: [phone]    [name] 01/25/2022, 1:59 PM

## 2022-01-26 NOTE — Telephone Encounter (Signed)
Spoke with patient & discussed upcomming OV and EGD dates. Pt verbalized all understanding.

## 2022-02-09 LAB — MISC LABCORP TEST (SEND OUT)
LabCorp test name: 5367
Labcorp test code: 9985

## 2022-02-11 ENCOUNTER — Other Ambulatory Visit (INDEPENDENT_AMBULATORY_CARE_PROVIDER_SITE_OTHER): Payer: Medicare PPO

## 2022-02-11 ENCOUNTER — Ambulatory Visit: Payer: Medicare PPO | Admitting: Physician Assistant

## 2022-02-11 VITALS — BP 98/76 | HR 107 | Ht 68.0 in | Wt 92.0 lb

## 2022-02-11 DIAGNOSIS — K766 Portal hypertension: Secondary | ICD-10-CM | POA: Diagnosis not present

## 2022-02-11 DIAGNOSIS — K9089 Other intestinal malabsorption: Secondary | ICD-10-CM

## 2022-02-11 DIAGNOSIS — K3189 Other diseases of stomach and duodenum: Secondary | ICD-10-CM

## 2022-02-11 DIAGNOSIS — K259 Gastric ulcer, unspecified as acute or chronic, without hemorrhage or perforation: Secondary | ICD-10-CM | POA: Diagnosis not present

## 2022-02-11 DIAGNOSIS — K860 Alcohol-induced chronic pancreatitis: Secondary | ICD-10-CM | POA: Diagnosis not present

## 2022-02-11 LAB — CBC WITH DIFFERENTIAL/PLATELET
Basophils Absolute: 0.2 10*3/uL — ABNORMAL HIGH (ref 0.0–0.1)
Basophils Relative: 0.9 % (ref 0.0–3.0)
Eosinophils Absolute: 0.1 10*3/uL (ref 0.0–0.7)
Eosinophils Relative: 0.5 % (ref 0.0–5.0)
HCT: 32.3 % — ABNORMAL LOW (ref 36.0–46.0)
Hemoglobin: 10.8 g/dL — ABNORMAL LOW (ref 12.0–15.0)
Lymphocytes Relative: 18.7 % (ref 12.0–46.0)
Lymphs Abs: 3.3 10*3/uL (ref 0.7–4.0)
MCHC: 33.6 g/dL (ref 30.0–36.0)
MCV: 92.7 fl (ref 78.0–100.0)
Monocytes Absolute: 1 10*3/uL (ref 0.1–1.0)
Monocytes Relative: 5.7 % (ref 3.0–12.0)
Neutro Abs: 13.1 10*3/uL — ABNORMAL HIGH (ref 1.4–7.7)
Neutrophils Relative %: 74.2 % (ref 43.0–77.0)
Platelets: 337 10*3/uL (ref 150.0–400.0)
RBC: 3.48 Mil/uL — ABNORMAL LOW (ref 3.87–5.11)
RDW: 16.1 % — ABNORMAL HIGH (ref 11.5–15.5)
WBC: 17.7 10*3/uL — ABNORMAL HIGH (ref 4.0–10.5)

## 2022-02-11 MED ORDER — HYOSCYAMINE SULFATE SL 0.125 MG SL SUBL: SUBLINGUAL_TABLET | SUBLINGUAL | 11 refills | Status: DC

## 2022-02-11 MED ORDER — PANTOPRAZOLE SODIUM 40 MG PO TBEC: 40.0000 mg | DELAYED_RELEASE_TABLET | Freq: Two times a day (BID) | ORAL | 2 refills | Status: DC

## 2022-02-11 MED ORDER — PANCRELIPASE (LIP-PROT-AMYL) 12000-38000 UNITS PO CPEP: 24000.0000 [IU] | ORAL_CAPSULE | Freq: Three times a day (TID) | ORAL | 2 refills | Status: DC

## 2022-02-11 NOTE — Patient Instructions (Addendum)
Increase your Pantoprazole to 40 mg twice daily - (Take before breakfast and dinner. )  Finish prescription of Carafate. ( Take between meals and at bedtime).   We have sent the following medications to your pharmacy for you to pick up at your convenience: Levsin , Creon   Your provider has requested that you go to the basement level for lab work before leaving today. Press "B" on the elevator. The lab is located at the first door on the left as you exit the elevator.   You have been scheduled for an endoscopy. Please follow written instructions given to you at your visit today. If you use inhalers (even only as needed), please bring them with you on the day of your procedure.  Due to recent changes in healthcare laws, you may see the results of your imaging and laboratory studies on MyChart before your provider has had a chance to review them.  We understand that in some cases there may be results that are confusing or concerning to you. Not all laboratory results come back in the same time frame and the provider may be waiting for multiple results in order to interpret others.  Please give [place] 48 hours in order for your provider to thoroughly review all the results before contacting the office for clarification of your results.   Thank you for choosing me and [facility].  Amy Esterwood PA-C

## 2022-02-15 ENCOUNTER — Encounter: Payer: Self-pay | Admitting: Physician Assistant

## 2022-02-15 NOTE — Progress Notes (Signed)
Subjective:    Patient ID: Carolyn [name], female    DOB: 01/06/1956, 66 y.o.   MRN: [id number]  HPI  Carolyn [name] is a pleasant 66 year old white female, established with Dr. Orvan FalconerBeavers who comes in today for post hospital follow-up after a long hospital admission 01/06/2022 through 01/25/2022. She was initially admitted with what was felt to be osteomyelitis of her toe.  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 She was seen in consult by GI on 01/17/2022 due to progressive anemia and heme positive stool.  On admission hemoglobin was 10.9, gradually drifted to 8.5 x 10 32,023, then 6.6 on 01/17/2022 required 1 unit of packed RBCs with hemoglobin up to 8.3.  B12 and folate normal, iron was 58 She underwent EGD on 01/19/2022 with a normal-appearing esophagus, no esophageal varices, noted moderate portal gastropathy, a healed scar in the stomach from prior PEG tube, 1 deeply cratered 15 mm prepyloric ulcer with a self-contained fistulous track at the base that communicated with the duodenal bulb.  Biopsies were negative for H. pylori.  She has been on a course of twice daily PPI and Carafate 1 g 4 times daily. She had labs done on 01/24/2022 with hemoglobin 8.4/hematocrit 25.9/platelets 209, AFP 3.9 Ultrasound had also been done on 01/19/2022 showing a CBD of 13 mm, cirrhotic appearing liver small amount of perihepatic ascites and a right pleural effusion.  She says that she has been taking her medications as instructed, has also remained on Creon as instructed.  When I reviewed her med list with her it appears that she had only been taking the Protonix once daily rather than prescribed twice daily. She says that she has been feeling better since discharge from the hospital, has been trying to push  herself to eat, has no complaints of abdominal pain, no nausea or vomiting, no melena or hematochezia. She has not had follow-up labs since discharge from the hospital.   Review of Systems Pertinent positive and negative review of systems were noted in the above HPI section.  All other review of systems was otherwise negative.   Outpatient Encounter Medications as of 02/11/2022  Medication Sig   ascorbic acid (VITAMIN C) 500 MG tablet Take 1 tablet (500 mg total) by mouth 2 (two) times daily.   calcium carbonate (OS-CAL - DOSED IN MG OF ELEMENTAL CALCIUM) 1250 MG tablet Take 1 tablet by mouth daily.   famotidine (PEPCID) 10 MG tablet Take 10 mg by mouth 2 (two) times daily.   feeding supplement (ENSURE ENLIVE / ENSURE PLUS) LIQD Take 237 mLs by mouth 3 (three) times daily between meals.   furosemide (LASIX) 40 MG tablet Take 1 tablet (40 mg total) by mouth daily.   lidocaine (XYLOCAINE) 2 % solution Use as directed 15 mLs in the mouth or throat every 6 (six) hours as needed for mouth pain.   Multiple Vitamin (MULITIVITAMIN WITH MINERALS) TABS Take 1 tablet by mouth daily.   nortriptyline (PAMELOR) 10 MG capsule Take 30 mg by mouth at bedtime.   ondansetron (ZOFRAN) 4 MG tablet Take 4 mg by mouth every 8 (eight) hours as needed for nausea or vomiting.   polyethylene glycol (MIRALAX / GLYCOLAX) 17 g packet Take 17 g by mouth 2 (two) times daily as needed for mild constipation.   potassium chloride SA (  KLOR-CON M) 20 MEQ tablet Take 0.5 tablets (10 mEq total) by mouth daily.   silver sulfADIAZINE (SILVADENE) 1 % cream Apply topically 2 (two) times daily.   sucralfate (CARAFATE) 1 GM/10ML suspension Take 10 mLs (1 g total) by mouth 4 (four) times daily -  with meals and at bedtime.   tiZANidine (ZANAFLEX) 2 MG tablet Take 1 tablet (2 mg total) by mouth every 12 (twelve) hours as needed for muscle spasms.   vitamin B-12 (CYANOCOBALAMIN) 100 MCG tablet Take 100 mcg by mouth daily.   [EXPIRED]  Vitamin D, Ergocalciferol, (DRISDOL) 1.25 MG (50000 UNIT) CAPS capsule Take 1 capsule (50,000 Units total) by mouth every 7 (seven) days for 3 doses. Takes every Wednesday   zinc sulfate 220 (50 Zn) MG capsule Take 1 capsule (220 mg total) by mouth daily.   [DISCONTINUED] dicyclomine (BENTYL) 10 MG capsule Take 1 capsule (10 mg total) by mouth every 8 (eight) hours as needed for spasms (Abd cramps).   [DISCONTINUED] lipase/protease/amylase (CREON) 12000 units CPEP capsule Take 2 capsules (24,000 Units total) by mouth 3 (three) times daily with meals.   [DISCONTINUED] pantoprazole (PROTONIX) 40 MG tablet Take 1 tablet (40 mg total) by mouth 2 (two) times daily.   Hyoscyamine Sulfate SL (LEVSIN/SL) 0.125 MG SUBL Place 1 each under the tongue every 6(six) hours as needed.   lipase/protease/amylase (CREON) 12000-38000 units CPEP capsule Take 2 capsules (24,000 Units total) by mouth 3 (three) times daily with meals.   pantoprazole (PROTONIX) 40 MG tablet Take 1 tablet (40 mg total) by mouth 2 (two) times daily before a meal.   [DISCONTINUED] Hyoscyamine Sulfate SL (LEVSIN/SL) 0.125 MG SUBL Place 1 each under the tongue 4 (four) times daily as needed for up to 5 days. (Patient not taking: Reported on 01/07/2022)   [DISCONTINUED] PROMETHAZINE HCL PO Take 4 mg by mouth.    [DISCONTINUED] Ranitidine HCl (ZANTAC PO) Take by mouth.     No facility-administered encounter medications on file as of 02/11/2022.   Allergies  Allergen Reactions   Peanut-Containing Drug Products Anaphylaxis    Tree nuts   Patient Active Problem List   Diagnosis Date Noted   Abnormal CT of the abdomen 01/19/2022   Chronic gastric ulcer with perforation (HCC) 01/19/2022   Alcoholic cirrhosis of liver without ascites (HCC) 01/18/2022   Occult blood in stools 01/18/2022   Portal hypertensive gastropathy (HCC) 01/18/2022   Anasarca 01/12/2022   Acute respiratory failure with hypoxia (HCC) 01/11/2022   Hypoalbuminemia 01/11/2022    Pleural effusion 01/10/2022   Abdominal distention 01/10/2022   Physical deconditioning 01/09/2022   Pressure injury of skin 01/08/2022   Hypokalemia 01/07/2022   Hypomagnesemia 01/07/2022   AKI (acute kidney injury) (HCC) 01/07/2022   Protein-calorie malnutrition, severe (HCC) 01/07/2022   Cellulitis of right foot 01/07/2022   Normocytic anemia 01/07/2022   Osteomyelitis (HCC) 01/06/2022   Intractable nausea and vomiting 06/13/2020   Metabolic acidosis, increased anion gap 06/12/2020   Seizure (HCC) 05/08/2017   Chronic pain syndrome 05/08/2017   Gastroesophageal reflux disease without esophagitis 05/08/2017   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HCC) 05/08/2017   Pancreatic steatorrhea 05/08/2017   Osteoporosis 03/16/2011   Endometriosis    Malabsorption    Chronic pancreatitis (HCC)    Vitamin D deficiency    Social History   Socioeconomic History   Marital status: Single    Spouse name: Not on file   Number of children: Not on file   Years of education: Not on file  Highest education level: Not on file  Occupational History   Not on file  Tobacco Use   Smoking status: Never   Smokeless tobacco: Never  Substance and Sexual Activity   Alcohol use: Yes    Comment: occ ( none in several weeks)   Drug use: No   Sexual activity: Never    Partners: Male  Other Topics Concern   Not on file  Social History Narrative   Not on file   Social Determinants of Health   Financial Resource Strain: Not on file  Food Insecurity: No Food Insecurity (01/07/2022)   Hunger Vital Sign    Worried About Running Out of Food in the Last Year: Never true    Ran Out of Food in the Last Year: Never true  Transportation Needs: No Transportation Needs (01/07/2022)   PRAPARE - Administrator, Civil Service (Medical): No    Lack of Transportation (Non-Medical): No  Physical Activity: Not on file  Stress: Not on file  Social Connections: Not on file  Intimate Partner Violence:  Not At Risk (01/07/2022)   Humiliation, Afraid, Rape, and Kick questionnaire    Fear of Current or Ex-Partner: No    Emotionally Abused: No    Physically Abused: No    Sexually Abused: No    Ms. Damiano's family history includes Breast cancer in her maternal grandmother; Cancer in her father.      Objective:    Vitals:   02/11/22 1458  BP: 98/76  Pulse: (!) 107    Physical Exam Well-developed very thin older white female no acute distress.  Height, Weight 92, BMI 13.99  HEENT; nontraumatic normocephalic, EOMI, PE R LA, sclera anicteric. Oropharynx; not examined today Neck; supple, no JVD Cardiovascular; regular rate and rhythm with S1-S2, no murmur rub or gallop Pulmonary; Clear bilaterally Abdomen; soft, nontender, nondistended, long midline incisional scar no palpable mass or hepatosplenomegaly, bowel sounds are active Rectal; not done today Skin; benign exam, no jaundice rash or appreciable lesions Extremities; no clubbing cyanosis or edema skin warm and dry Neuro/Psych; alert and oriented x4, grossly nonfocal mood and affect appropriate        Assessment & Plan:   #83 66 year old white female here for post hospital follow-up after she was seen in consultation during recent hospitalization 10/25 through 01/25/2022.  Initially thought to have an osteomyelitis of the toe but MRI was negative.  She had cellulitis which was treated with a 7-day course of antibiotics. During that admission found to have a drop in hemoglobin and heme positive stool EGD 01/19/2022 showed no esophageal varices, moderate portal gastropathy, 1 deeply cratered 15 mm prepyloric ulcer with self-contained fistulous track at the base that communicated with the duodenal bulb felt to be consistent with a contained perforation. Biopsies negative for H. pylori.  She was supposed to be on twice daily Protonix at the time of discharge but on conversation appears she has been taking this once daily, she has  continued Carafate 1 g 4 times daily.  #2 anemia acute secondary to above-no iron deficiency #3 history of chronic pancreatitis #4 history of EtOH induced cirrhosis-no varices on EGD, trace perihepatic ascites on ultrasound, normal AFP #5 chronic GERD #6 malnutrition multifactoral #7 acute kidney injury resolved  Plan; increase Protonix to 40 mg p.o. twice daily, patient instructed to stay on twice daily dosing at least until after we do follow-up EGD and then as instructed.  She will at least need to maintain chronic once daily  PPI long-term Complete current course of Carafate 1 g p.o. 4 times daily between meals and at bedtime finish current prescription Will check CBC today Refilled Levsin sublingual every 6 hours as needed at patient request Refill Creon 12,000/38,000  2 p.o. with each meal  Patient will be scheduled for follow-up EGD with Dr. Orvan Falconer early February to document healing of the prepyloric ulcer with contained perforation.   Caitland Porchia Oswald Hillock PA-C 02/15/2022   Cc: Farris Has, MD

## 2022-02-18 ENCOUNTER — Telehealth: Payer: Self-pay | Admitting: Physician Assistant

## 2022-02-18 ENCOUNTER — Other Ambulatory Visit (INDEPENDENT_AMBULATORY_CARE_PROVIDER_SITE_OTHER): Payer: Medicare PPO

## 2022-02-18 ENCOUNTER — Other Ambulatory Visit: Payer: Self-pay

## 2022-02-18 DIAGNOSIS — K860 Alcohol-induced chronic pancreatitis: Secondary | ICD-10-CM

## 2022-02-18 DIAGNOSIS — D649 Anemia, unspecified: Secondary | ICD-10-CM

## 2022-02-18 DIAGNOSIS — K7030 Alcoholic cirrhosis of liver without ascites: Secondary | ICD-10-CM

## 2022-02-18 LAB — CBC WITH DIFFERENTIAL/PLATELET
Basophils Absolute: 0.1 10*3/uL (ref 0.0–0.1)
Basophils Relative: 0.7 % (ref 0.0–3.0)
Eosinophils Absolute: 0.1 10*3/uL (ref 0.0–0.7)
Eosinophils Relative: 1 % (ref 0.0–5.0)
HCT: 27 % — ABNORMAL LOW (ref 36.0–46.0)
Hemoglobin: 9.2 g/dL — ABNORMAL LOW (ref 12.0–15.0)
Lymphocytes Relative: 22.3 % (ref 12.0–46.0)
Lymphs Abs: 2.1 10*3/uL (ref 0.7–4.0)
MCHC: 34 g/dL (ref 30.0–36.0)
MCV: 92.2 fl (ref 78.0–100.0)
Monocytes Absolute: 0.7 10*3/uL (ref 0.1–1.0)
Monocytes Relative: 7.4 % (ref 3.0–12.0)
Neutro Abs: 6.5 10*3/uL (ref 1.4–7.7)
Neutrophils Relative %: 68.6 % (ref 43.0–77.0)
Platelets: 206 10*3/uL (ref 150.0–400.0)
RBC: 2.93 Mil/uL — ABNORMAL LOW (ref 3.87–5.11)
RDW: 15.1 % (ref 11.5–15.5)
WBC: 9.5 10*3/uL (ref 4.0–10.5)

## 2022-02-18 LAB — BASIC METABOLIC PANEL
BUN: 19 mg/dL (ref 6–23)
CO2: 33 mEq/L — ABNORMAL HIGH (ref 19–32)
Calcium: 7.4 mg/dL — ABNORMAL LOW (ref 8.4–10.5)
Chloride: 97 mEq/L (ref 96–112)
Creatinine, Ser: 0.8 mg/dL (ref 0.40–1.20)
GFR: 77.71 mL/min (ref >60.00)
Glucose, Bld: 73 mg/dL (ref 70–99)
Potassium: 3.3 mEq/L — ABNORMAL LOW (ref 3.5–5.1)
Sodium: 135 mEq/L (ref 135–145)

## 2022-02-18 LAB — SEDIMENTATION RATE: Sed Rate: 3 mm/hr (ref 0–30)

## 2022-02-18 MED ORDER — POTASSIUM CHLORIDE CRYS ER 20 MEQ PO TBCR: 20.0000 meq | EXTENDED_RELEASE_TABLET | Freq: Two times a day (BID) | ORAL | 0 refills | Status: DC

## 2022-02-18 MED ORDER — FUROSEMIDE 40 MG PO TABS: 40.0000 mg | ORAL_TABLET | Freq: Every day | ORAL | 0 refills | Status: DC

## 2022-02-18 MED ORDER — SPIRONOLACTONE 100 MG PO TABS: 100.0000 mg | ORAL_TABLET | Freq: Every day | ORAL | 0 refills | Status: DC

## 2022-02-18 NOTE — Telephone Encounter (Signed)
Recently seen by Mike Gip for post hospitalization follow up. Multiple complex issues. Please see the office note from the visit on 02/11/22.  The patient was calling us today with complaints of LE edema. The edema is now up to her knees and her legs feel very heavy. She states if was better when she was discharged from the hospital. This has become an issue in the last 3 days. Reports low sodium diet, no fast foods, no processed foods. She denies any pain. The patient will return for labs today due to elevated WBC.  She is asking for diuretic, stating " I took them in the hospital and it helps with my swelling." Amy Monica Becton is out of the office the rest of this week.  Please advise in her absence. Thank you

## 2022-02-18 NOTE — Telephone Encounter (Signed)
Discussed in detail with the patient using teach back method. She will return Monday 12/11 for repeat labs.

## 2022-02-18 NOTE — Telephone Encounter (Signed)
Inbound call from patient stating she is experiencing swelling in her legs and she was prescribed lasik during her hospitalization. She was wondering if this was something she can be prescribed again. Please advise.

## 2022-02-22 NOTE — Telephone Encounter (Signed)
Called in to advise that she will be in tomorrow to do labs.

## 2022-02-23 ENCOUNTER — Other Ambulatory Visit (INDEPENDENT_AMBULATORY_CARE_PROVIDER_SITE_OTHER): Payer: Medicare PPO

## 2022-02-23 DIAGNOSIS — K7030 Alcoholic cirrhosis of liver without ascites: Secondary | ICD-10-CM | POA: Diagnosis not present

## 2022-02-23 LAB — BASIC METABOLIC PANEL
BUN: 20 mg/dL (ref 6–23)
CO2: 34 mEq/L — ABNORMAL HIGH (ref 19–32)
Calcium: 7.4 mg/dL — ABNORMAL LOW (ref 8.4–10.5)
Chloride: 91 mEq/L — ABNORMAL LOW (ref 96–112)
Creatinine, Ser: 1.21 mg/dL — ABNORMAL HIGH (ref 0.40–1.20)
GFR: 47.29 mL/min — ABNORMAL LOW (ref >60.00)
Glucose, Bld: 135 mg/dL — ABNORMAL HIGH (ref 70–99)
Potassium: 3.8 mEq/L (ref 3.5–5.1)
Sodium: 134 mEq/L — ABNORMAL LOW (ref 135–145)

## 2022-02-23 LAB — CBC WITH DIFFERENTIAL/PLATELET
Basophils Absolute: 0.1 10*3/uL (ref 0.0–0.1)
Basophils Relative: 0.8 % (ref 0.0–3.0)
Eosinophils Absolute: 0.1 10*3/uL (ref 0.0–0.7)
Eosinophils Relative: 0.6 % (ref 0.0–5.0)
HCT: 28.5 % — ABNORMAL LOW (ref 36.0–46.0)
Hemoglobin: 9.7 g/dL — ABNORMAL LOW (ref 12.0–15.0)
Lymphocytes Relative: 21.4 % (ref 12.0–46.0)
Lymphs Abs: 2.2 10*3/uL (ref 0.7–4.0)
MCHC: 34.2 g/dL (ref 30.0–36.0)
MCV: 92.9 fl (ref 78.0–100.0)
Monocytes Absolute: 0.6 10*3/uL (ref 0.1–1.0)
Monocytes Relative: 5.7 % (ref 3.0–12.0)
Neutro Abs: 7.2 10*3/uL (ref 1.4–7.7)
Neutrophils Relative %: 71.5 % (ref 43.0–77.0)
Platelets: 229 10*3/uL (ref 150.0–400.0)
RBC: 3.07 Mil/uL — ABNORMAL LOW (ref 3.87–5.11)
RDW: 15 % (ref 11.5–15.5)
WBC: 10.1 10*3/uL (ref 4.0–10.5)

## 2022-02-24 ENCOUNTER — Other Ambulatory Visit: Payer: Self-pay

## 2022-02-24 DIAGNOSIS — K7030 Alcoholic cirrhosis of liver without ascites: Secondary | ICD-10-CM

## 2022-02-26 ENCOUNTER — Other Ambulatory Visit (INDEPENDENT_AMBULATORY_CARE_PROVIDER_SITE_OTHER): Payer: Medicare PPO

## 2022-02-26 DIAGNOSIS — K7030 Alcoholic cirrhosis of liver without ascites: Secondary | ICD-10-CM | POA: Diagnosis not present

## 2022-02-26 LAB — CBC WITH DIFFERENTIAL/PLATELET
Basophils Absolute: 0.1 10*3/uL (ref 0.0–0.1)
Basophils Relative: 0.5 % (ref 0.0–3.0)
Eosinophils Absolute: 0 10*3/uL (ref 0.0–0.7)
Eosinophils Relative: 0.4 % (ref 0.0–5.0)
HCT: 27.4 % — ABNORMAL LOW (ref 36.0–46.0)
Hemoglobin: 8.9 g/dL — ABNORMAL LOW (ref 12.0–15.0)
Lymphocytes Relative: 12.1 % (ref 12.0–46.0)
Lymphs Abs: 1.4 10*3/uL (ref 0.7–4.0)
MCHC: 32.6 g/dL (ref 30.0–36.0)
MCV: 93.9 fl (ref 78.0–100.0)
Monocytes Absolute: 0.5 10*3/uL (ref 0.1–1.0)
Monocytes Relative: 4.6 % (ref 3.0–12.0)
Neutro Abs: 9.7 10*3/uL — ABNORMAL HIGH (ref 1.4–7.7)
Neutrophils Relative %: 82.4 % — ABNORMAL HIGH (ref 43.0–77.0)
Platelets: 220 10*3/uL (ref 150.0–400.0)
RBC: 2.92 Mil/uL — ABNORMAL LOW (ref 3.87–5.11)
RDW: 14.7 % (ref 11.5–15.5)
WBC: 11.8 10*3/uL — ABNORMAL HIGH (ref 4.0–10.5)

## 2022-02-26 LAB — BASIC METABOLIC PANEL
BUN: 41 mg/dL — ABNORMAL HIGH (ref 6–23)
CO2: 28 mEq/L (ref 19–32)
Calcium: 7.1 mg/dL — ABNORMAL LOW (ref 8.4–10.5)
Chloride: 93 mEq/L — ABNORMAL LOW (ref 96–112)
Creatinine, Ser: 1.59 mg/dL — ABNORMAL HIGH (ref 0.40–1.20)
GFR: 34.08 mL/min — ABNORMAL LOW (ref >60.00)
Glucose, Bld: 132 mg/dL — ABNORMAL HIGH (ref 70–99)
Potassium: 4.2 mEq/L (ref 3.5–5.1)
Sodium: 132 mEq/L — ABNORMAL LOW (ref 135–145)

## 2022-02-27 DIAGNOSIS — E11621 Type 2 diabetes mellitus with foot ulcer: Secondary | ICD-10-CM | POA: Diagnosis not present

## 2022-02-27 DIAGNOSIS — L308 Other specified dermatitis: Secondary | ICD-10-CM | POA: Insufficient documentation

## 2022-02-27 DIAGNOSIS — L97522 Non-pressure chronic ulcer of other part of left foot with fat layer exposed: Secondary | ICD-10-CM | POA: Diagnosis not present

## 2022-02-27 DIAGNOSIS — Z91010 Allergy to peanuts: Secondary | ICD-10-CM | POA: Insufficient documentation

## 2022-02-27 DIAGNOSIS — M7989 Other specified soft tissue disorders: Secondary | ICD-10-CM | POA: Diagnosis present

## 2022-02-28 ENCOUNTER — Emergency Department (HOSPITAL_COMMUNITY): Payer: Medicare PPO

## 2022-02-28 ENCOUNTER — Other Ambulatory Visit: Payer: Self-pay

## 2022-02-28 ENCOUNTER — Encounter (HOSPITAL_COMMUNITY): Payer: Self-pay

## 2022-02-28 ENCOUNTER — Emergency Department (HOSPITAL_COMMUNITY)
Admission: EM | Admit: 2022-02-28 | Discharge: 2022-02-28 | Disposition: A | Discharge status: Home / Self Care | Payer: Medicare PPO | Attending: Emergency Medicine | Admitting: Emergency Medicine

## 2022-02-28 DIAGNOSIS — L97502 Non-pressure chronic ulcer of other part of unspecified foot with fat layer exposed: Secondary | ICD-10-CM

## 2022-02-28 DIAGNOSIS — L308 Other specified dermatitis: Secondary | ICD-10-CM

## 2022-02-28 LAB — CBC WITH DIFFERENTIAL/PLATELET
Abs Immature Granulocytes: 0.03 10*3/uL (ref 0.00–0.07)
Basophils Absolute: 0 10*3/uL (ref 0.0–0.1)
Basophils Relative: 0 %
Eosinophils Absolute: 0.1 10*3/uL (ref 0.0–0.5)
Eosinophils Relative: 1 %
HCT: 25.1 % — ABNORMAL LOW (ref 36.0–46.0)
Hemoglobin: 8.4 g/dL — ABNORMAL LOW (ref 12.0–15.0)
Immature Granulocytes: 0 %
Lymphocytes Relative: 19 %
Lymphs Abs: 1.9 10*3/uL (ref 0.7–4.0)
MCH: 31.3 pg (ref 26.0–34.0)
MCHC: 33.5 g/dL (ref 30.0–36.0)
MCV: 93.7 fL (ref 80.0–100.0)
Monocytes Absolute: 0.6 10*3/uL (ref 0.1–1.0)
Monocytes Relative: 6 %
Neutro Abs: 7.4 10*3/uL (ref 1.7–7.7)
Neutrophils Relative %: 74 %
Platelets: 175 10*3/uL (ref 150–400)
RBC: 2.68 MIL/uL — ABNORMAL LOW (ref 3.87–5.11)
RDW: 15.1 % (ref 11.5–15.5)
WBC: 10 10*3/uL (ref 4.0–10.5)
nRBC: 0 % (ref 0.0–0.2)

## 2022-02-28 LAB — BASIC METABOLIC PANEL
Anion gap: 10 (ref 5–15)
BUN: 48 mg/dL — ABNORMAL HIGH (ref 8–23)
CO2: 27 mmol/L (ref 22–32)
Calcium: 7.5 mg/dL — ABNORMAL LOW (ref 8.9–10.3)
Chloride: 95 mmol/L — ABNORMAL LOW (ref 98–111)
Creatinine, Ser: 1.22 mg/dL — ABNORMAL HIGH (ref 0.44–1.00)
GFR, Estimated: 50 mL/min — ABNORMAL LOW (ref >60)
Glucose, Bld: 87 mg/dL (ref 70–99)
Potassium: 4.4 mmol/L (ref 3.5–5.1)
Sodium: 132 mmol/L — ABNORMAL LOW (ref 135–145)

## 2022-02-28 MED ORDER — OXYCODONE-ACETAMINOPHEN 5-325 MG PO TABS: 1.0000 | ORAL_TABLET | Freq: Four times a day (QID) | ORAL | 0 refills | Status: DC | PRN

## 2022-02-28 MED ORDER — TRIAMCINOLONE ACETONIDE 0.1 % EX CREA: 1.0000 | TOPICAL_CREAM | Freq: Two times a day (BID) | CUTANEOUS | 0 refills | Status: DC

## 2022-02-28 MED ORDER — HYDROCODONE-ACETAMINOPHEN 5-325 MG PO TABS
1.0000 | ORAL_TABLET | Freq: Once | ORAL | Status: AC
  Administered 2022-02-28: 1 via ORAL
  Filled 2022-02-28: qty 1

## 2022-02-28 NOTE — ED Provider Notes (Signed)
[facility]-EMERGENCY DEPT Provider Note   CSN: [id number] Arrival date & time: 02/27/22  2258     History  Chief Complaint  Patient presents with   Leg Swelling    Carolyn [name] is a 66 y.o. female.  HPI     This is a 66 year old female with a history of malnutrition, osteomyelitis of the toe, diabetes who presents with worsening pain of the feet.  Patient reports that she was in the hospital for prolonged period of time at the end of October into November.  She was treated for osteomyelitis.  She was discharged home and has follow-up with podiatry later this week.  She states she has had ongoing worsening pain in the hands and feet.  She has noted peeling of both the hands and feet since discharge.  She has not noted any fevers.  She has not noted any new sores.  While she was admitted to the hospital, she was found to be anemic and thrombocytopenic.  She has a gastric ulcer.  Home Medications Prior to Admission medications   Medication Sig Start Date End Date Taking? Authorizing Provider  buprenorphine (BUTRANS) 20 MCG/HR PTWK Place 1 patch onto the skin once a week.   Yes [provider]  famotidine (PEPCID) 10 MG tablet Take 10 mg by mouth 2 (two) times daily.   Yes [provider]  feeding supplement (ENSURE ENLIVE / ENSURE PLUS) LIQD Take 237 mLs by mouth 3 (three) times daily between meals. 01/25/22  Yes Briant Cedar, MD  furosemide (LASIX) 40 MG tablet Take 1 tablet (40 mg total) by mouth daily. Patient taking differently: Take 40 mg by mouth daily as needed for fluid or edema. 02/18/22  Yes Tressia Danas, MD  lidocaine (XYLOCAINE) 2 % solution Use as directed 15 mLs in the mouth or throat every 6 (six) hours as needed for mouth pain. 09/05/20  Yes Cardama, Amadeo Garnet, MD  lipase/protease/amylase (CREON) 12000-38000 units CPEP capsule Take 2 capsules (24,000 Units total) by mouth 3 (three) times daily with meals. 02/11/22   Yes Esterwood, Amy S, PA-C  Multiple Vitamin (MULITIVITAMIN WITH MINERALS) TABS Take 1 tablet by mouth daily.   Yes [provider]  ondansetron (ZOFRAN) 4 MG tablet Take 4 mg by mouth every 8 (eight) hours as needed for nausea or vomiting.   Yes [provider]  oxyCODONE-acetaminophen (PERCOCET/ROXICET) 5-325 MG tablet Take 1 tablet by mouth every 6 (six) hours as needed for severe pain. 02/28/22  Yes Jacobi Nile, Mayer Masker, MD  pantoprazole (PROTONIX) 40 MG tablet Take 1 tablet (40 mg total) by mouth 2 (two) times daily before a meal. 02/11/22  Yes Esterwood, Amy S, PA-C  polyethylene glycol (MIRALAX / GLYCOLAX) 17 g packet Take 17 g by mouth 2 (two) times daily as needed for mild constipation. 01/25/22  Yes Briant Cedar, MD  silver sulfADIAZINE (SILVADENE) 1 % cream Apply topically 2 (two) times daily. Patient taking differently: Apply 1 Application topically 2 (two) times daily as needed (blister burns). 01/25/22  Yes Briant Cedar, MD  sucralfate (CARAFATE) 1 GM/10ML suspension Take 10 mLs (1 g total) by mouth 4 (four) times daily -  with meals and at bedtime. 01/25/22  Yes Briant Cedar, MD  tiZANidine (ZANAFLEX) 2 MG tablet Take 1 tablet (2 mg total) by mouth every 12 (twelve) hours as needed for muscle spasms. 01/25/22  Yes Briant Cedar, MD  triamcinolone cream (KENALOG) 0.1 % Apply 1 Application topically  2 (two) times daily. Only to hands 02/28/22  Yes Haydn Cush, Mayer Maskerourtney F, MD  vitamin B-12 (CYANOCOBALAMIN) 100 MCG tablet Take 100 mcg by mouth daily.   Yes [provider]  zinc sulfate 220 (50 Zn) MG capsule Take 1 capsule (220 mg total) by mouth daily. 01/09/22  Yes Almon HerculesGonfa, Taye T, MD  Hyoscyamine Sulfate SL (LEVSIN/SL) 0.125 MG SUBL Place 1 each under the tongue every 6(six) hours as needed. Patient not taking: Reported on 02/28/2022 02/11/22   Esterwood, Amy S, PA-C  potassium chloride SA (KLOR-CON M) 20 MEQ tablet Take 1 tablet (20 mEq  total) by mouth 2 (two) times daily for 2 days. 02/18/22 02/20/22  Tressia DanasBeavers, Kimberly, MD  spironolactone (ALDACTONE) 100 MG tablet Take 1 tablet (100 mg total) by mouth daily. Patient not taking: Reported on 02/28/2022 02/18/22 03/20/22  Tressia DanasBeavers, Kimberly, MD  PROMETHAZINE HCL PO Take 4 mg by mouth.   06/06/11  [provider]  Ranitidine HCl (ZANTAC PO) Take by mouth.    06/06/11  [provider]      Allergies    Peanut-containing drug products    Review of Systems   Review of Systems  Constitutional:  Negative for fever.  Cardiovascular:  Negative for chest pain.  Gastrointestinal:  Negative for abdominal pain.  Skin:  Positive for color change and wound.  All other systems reviewed and are negative.   Physical Exam Updated Vital Signs BP 91/65   Pulse 82   Temp (!) 97.4 F (36.3 C) (Oral)   Resp 12   Ht 1.727 m (5\' 8" )   Wt 40.8 kg   LMP 03/10/2009   SpO2 100%   BMI 13.68 kg/m  Physical Exam Vitals and nursing note reviewed.  Constitutional:      Appearance: She is well-developed.     Comments: Thin  HENT:     Head: Normocephalic and atraumatic.  Eyes:     Pupils: Pupils are equal, round, and reactive to light.  Cardiovascular:     Rate and Rhythm: Normal rate and regular rhythm.     Heart sounds: Normal heart sounds.  Pulmonary:     Effort: Pulmonary effort is normal. No respiratory distress.     Breath sounds: No wheezing.  Abdominal:     Palpations: Abdomen is soft.  Musculoskeletal:     Cervical back: Neck supple.  Skin:    General: Skin is warm and dry.     Comments: Desquamation noted of the palms and soles, multiple cracked areas over the bilateral hands, she has 2 ulcerations with granulation tissue at the toes of the feet (left second digit, right second digit), erythema noted in the feet with trace edema bilaterally  Neurological:     Mental Status: She is alert and oriented to person, place, and time.  Psychiatric:        Mood and  Affect: Mood normal.        ED Results / Procedures / Treatments   Labs (all labs ordered are listed, but only abnormal results are displayed) Labs Reviewed  BASIC METABOLIC PANEL - Abnormal; Notable for the following components:      Result Value   Sodium 132 (*)    Chloride 95 (*)    BUN 48 (*)    Creatinine, Ser 1.22 (*)    Calcium 7.5 (*)    GFR, Estimated 50 (*)    All other components within normal limits  CBC WITH DIFFERENTIAL/PLATELET - Abnormal; Notable for the following components:  RBC 2.68 (*)    Hemoglobin 8.4 (*)    HCT 25.1 (*)    All other components within normal limits  CBC WITH DIFFERENTIAL/PLATELET    EKG None  Radiology DG Foot Complete Left  Result Date: 02/28/2022 CLINICAL DATA:  66 year old female with bilateral foot blisters. EXAM: LEFT FOOT - COMPLETE 3+ VIEW COMPARISON:  Left foot series 01/07/2022. FINDINGS: The lateral view is oblique. On the other two views joint spaces and alignment in the left foot appears stable. There is a chronic intra-articular fracture at the medial base of the 1st proximal phalanx, stable. No acute osseous abnormality identified. No soft tissue gas or discrete soft tissue wound identified. IMPRESSION: No acute osseous or radiographic abnormality identified. Electronically Signed   By: Odessa Fleming M.D.   On: 02/28/2022 05:25   DG Foot Complete Right  Result Date: 02/28/2022 CLINICAL DATA:  66 year old female with bilateral foot blisters. EXAM: RIGHT FOOT COMPLETE - 3+ VIEW COMPARISON:  102523 ft series. FINDINGS: Stable visualized osseous structures. Mild cortical irregularity at the tuft of the 2nd phalanx appears unchanged since October. Stable joint spaces and alignment. No fracture or acute osteolysis identifiedp. No soft tissue gas or discrete soft tissue wound identified. IMPRESSION: Stable since October.  No acute osseous abnormality identified. Electronically Signed   By: Odessa Fleming M.D.   On: 02/28/2022 05:23     Procedures Procedures    Medications Ordered in ED Medications  HYDROcodone-acetaminophen (NORCO/VICODIN) 5-325 MG per tablet 1 tablet (has no administration in time range)    ED Course/ Medical Decision Making/ A&P                           Medical Decision Making Amount and/or Complexity of Data Reviewed Labs: ordered. Radiology: ordered.  Risk Prescription drug management.   This patient presents to the ED for concern of pain, this involves an extensive number of treatment options, and is a complaint that carries with it a high risk of complications and morbidity.  I considered the following differential and admission for this acute, potentially life threatening condition.  The differential diagnosis includes recurrent infection, cellulitis, fungal infection, dermatitis, dyshidrotic eczema  MDM:    This is a 66 year old female who presents with bilateral foot pain.  Recent history of ulcers on the bilateral second toes.  She was admitted and discharged with antibiotics after being found to have osteomyelitis.  She avoided amputation.  She states that she is yet to follow-up with podiatry.  She has finished antibiotics.  Reports worsening pain that is making difficult to walk.  She is overall thin and frail appearing.  She is chronically ill-appearing.  Initially vital signs notable for blood pressure 91/65.  She is afebrile.  I have reviewed her chart from her admission.  Blood pressures ranged 90s to 120s in the hospital.  She is not overtly septic.  She does have 2 ongoing ulcers on those toes but they do not appear acutely infected or any deeper.  There is no drainage.  She has desquamation of both the palms and the soles of the feet.  Other skin is spared as are mucous membranes.  Without the history of infection and osteomyelitis, would be most consistent with hidrotic eczema.  However, given her history, will repeat imaging and lab work.  No leukocytosis on lab work.  Metabolic  panel is at the patient's baseline.  X-rays do not show any persistent infection or bony erosion.  Had a long discussion with the patient and her husband.  She has podiatry follow-up on Tuesday.  Would avoid steroid creams on her feet at this time given known infection; however, we could trial steroid creams on her hand to see if this helps.  Also recommend moisturizing cream and barrier cream.  I have advised her to try to keep her feet as dry as possible and avoid cotton socks.  There is not appear to be an acute infection.  Will send home with a short course of pain medication.  She is on buprenorphine at baseline; however, she is having acute pain so we will give a short course of pain medication.  She was advised of return precautions.  (Labs, imaging, consults)  Labs: I Ordered, and personally interpreted labs.  The pertinent results include: CBC, BMP  Imaging Studies ordered: I ordered imaging studies including x-rays bilateral feet I independently visualized and interpreted imaging. I agree with the radiologist interpretation  Additional history obtained from family at bedside.  External records from outside source obtained and reviewed including prior evaluations  Cardiac Monitoring: The patient was maintained on a cardiac monitor.  I personally viewed and interpreted the cardiac monitored which showed an underlying rhythm of: Sinus rhythm  Reevaluation: After the interventions noted above, I reevaluated the patient and found that they have :improved  Social Determinants of Health:  chronic malnutrition  Disposition: Discharge  Co morbidities that complicate the patient evaluation  Past Medical History:  Diagnosis Date   Alcoholic cirrhosis (HCC)    Chronic pancreatitis (HCC)    Endometriosis    GERD (gastroesophageal reflux disease)    Liver disease    Malabsorption    MALABSORPTION SYNDROME   Osteoporosis 03/2011   t score -2.5   Pancreatitis    due to cyst and  tumors due to calcifications   Seizure (HCC)    no seizure disorder   Vitamin D deficiency 2012   VIT D 15     Medicines Meds ordered this encounter  Medications   HYDROcodone-acetaminophen (NORCO/VICODIN) 5-325 MG per tablet 1 tablet   oxyCODONE-acetaminophen (PERCOCET/ROXICET) 5-325 MG tablet    Sig: Take 1 tablet by mouth every 6 (six) hours as needed for severe pain.    Dispense:  15 tablet    Refill:  0   triamcinolone cream (KENALOG) 0.1 %    Sig: Apply 1 Application topically 2 (two) times daily. 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

## 2022-02-28 NOTE — ED Notes (Signed)
Two RNs attempted unsuccessfully to obtain IV access and/or labs.  IV team consulted.

## 2022-02-28 NOTE — Discharge Instructions (Addendum)
Seen today for peeling of the palms and soles.  Given your recent infection, would have you follow-up closely with podiatry and avoid any steroid creams on the feet until cleared.  However, there are features of the peeling that are suggestive of dyshidrotic eczema.  You may use a steroid cream on your hands.  Otherwise make sure that you are keeping a moisturizing cream or lotion such as Eucerin or Aquaphor.  Avoid cotton socks.  You will moisture with skin fabrics.  Allow your feet to air dry fully.  Follow-up closely with your podiatrist.  You may ultimately need dermatology evaluation.

## 2022-02-28 NOTE — ED Triage Notes (Signed)
States that she was just hear for three weeks for blisters on her feet, she states that they are very painful

## 2022-03-01 ENCOUNTER — Ambulatory Visit: Payer: Medicare PPO | Admitting: Podiatry

## 2022-03-02 ENCOUNTER — Ambulatory Visit (INDEPENDENT_AMBULATORY_CARE_PROVIDER_SITE_OTHER): Payer: Medicare PPO

## 2022-03-02 ENCOUNTER — Ambulatory Visit: Payer: Medicare PPO | Admitting: Podiatry

## 2022-03-02 VITALS — BP 124/74 | HR 110

## 2022-03-02 DIAGNOSIS — M2041 Other hammer toe(s) (acquired), right foot: Secondary | ICD-10-CM

## 2022-03-02 DIAGNOSIS — L97522 Non-pressure chronic ulcer of other part of left foot with fat layer exposed: Secondary | ICD-10-CM

## 2022-03-02 DIAGNOSIS — L97311 Non-pressure chronic ulcer of right ankle limited to breakdown of skin: Secondary | ICD-10-CM

## 2022-03-02 DIAGNOSIS — L97512 Non-pressure chronic ulcer of other part of right foot with fat layer exposed: Secondary | ICD-10-CM | POA: Diagnosis not present

## 2022-03-02 DIAGNOSIS — M79671 Pain in right foot: Secondary | ICD-10-CM

## 2022-03-02 DIAGNOSIS — R21 Rash and other nonspecific skin eruption: Secondary | ICD-10-CM | POA: Diagnosis not present

## 2022-03-02 DIAGNOSIS — M2042 Other hammer toe(s) (acquired), left foot: Secondary | ICD-10-CM

## 2022-03-02 DIAGNOSIS — J90 Pleural effusion, not elsewhere classified: Secondary | ICD-10-CM

## 2022-03-02 NOTE — Progress Notes (Signed)
Subjective:   Patient ID: Carolyn [name], female   DOB: 66 y.o.   MRN: [id number]   HPI Chief Complaint  Patient presents with   Foot Ulcer    Bilateral 2nd toe ulcers, started 2 months ago, patient feet are red and the skin is peeling and dry her hands are also red and peeling, patient states it feels like she is walking on glass, X-Rays done in the ED 3 days ago    66 year old female presents the office today with concerns of ulcers to both of her second toes.  She was previously admitted to the hospital and x-rays were negative for osteomyelitis.  She is also recent x-rays again.  She is also recently developed a skin issue on her hands and feet which has been dry, peeling, red skin.  She states that overall she gets generalized pain to her feet and her feet are sensitive.  It is not localized just to the second toes.  She denies any drainage or pus.   Review of Systems  All other systems reviewed and are negative.  Past Medical History:  Diagnosis Date   Alcoholic cirrhosis (HCC)    Chronic pancreatitis (HCC)    Endometriosis    GERD (gastroesophageal reflux disease)    Liver disease    Malabsorption    MALABSORPTION SYNDROME   Osteoporosis 03/2011   t score -2.5   Pancreatitis    due to cyst and tumors due to calcifications   Seizure (HCC)    no seizure disorder   Vitamin D deficiency 2012   VIT D 15    Past Surgical History:  Procedure Laterality Date   APPENDECTOMY     BIOPSY  01/19/2022   Procedure: BIOPSY;  Surgeon: Shellia Cleverly, DO;  Location: [facility];  Service: Gastroenterology;;   CHOLECYSTECTOMY     ESOPHAGOGASTRODUODENOSCOPY (EGD) WITH PROPOFOL N/A 01/19/2022   Procedure: ESOPHAGOGASTRODUODENOSCOPY (EGD) WITH PROPOFOL;  Surgeon: Shellia Cleverly, DO;  Location: [facility];  Service: Gastroenterology;  Laterality: N/A;   FEEDING TUBE RELOCATION  2010   JEJUNOSTOMY FEEDING TUBE  1990   MULTIPLE GASTRIC SURGERIES     MYOMECTOMY     OPEN REDUCTION  INTERNAL FIXATION (ORIF) DISTAL RADIAL FRACTURE Left 06/29/2018   Procedure: OPEN REDUCTION INTERNAL FIXATION (ORIF)LEFT  DISTAL RADIAL FRACTURE;  Surgeon: Betha Loa, MD;  Location: [facility];  Service: Orthopedics;  Laterality: Left;   ROUX-EN-Y GASTRIC BYPASS     TUBAL LIGATION       Current Outpatient Medications:    buprenorphine (BUTRANS) 20 MCG/HR PTWK, Place 1 patch onto the skin once a week., Disp: , Rfl:    famotidine (PEPCID) 10 MG tablet, Take 10 mg by mouth 2 (two) times daily., Disp: , Rfl:    feeding supplement (ENSURE ENLIVE / ENSURE PLUS) LIQD, Take 237 mLs by mouth 3 (three) times daily between meals., Disp: 237 mL, Rfl: 12   furosemide (LASIX) 40 MG tablet, Take 1 tablet (40 mg total) by mouth daily. (Patient taking differently: Take 40 mg by mouth daily as needed for fluid or edema.), Disp: 30 tablet, Rfl: 0   Hyoscyamine Sulfate SL (LEVSIN/SL) 0.125 MG SUBL, Place 1 each under the tongue every 6(six) hours as needed. (Patient not taking: Reported on 02/28/2022), Disp: 40 tablet, Rfl: 11   lidocaine (XYLOCAINE) 2 % solution, Use as directed 15 mLs in the mouth or throat every 6 (six) hours as needed for mouth pain., Disp: 100 mL, Rfl: 0  lipase/protease/amylase (CREON) 12000-38000 units CPEP capsule, Take 2 capsules (24,000 Units total) by mouth 3 (three) times daily with meals., Disp: 270 capsule, Rfl: 2   Multiple Vitamin (MULITIVITAMIN WITH MINERALS) TABS, Take 1 tablet by mouth daily., Disp: , Rfl:    ondansetron (ZOFRAN) 4 MG tablet, Take 4 mg by mouth every 8 (eight) hours as needed for nausea or vomiting., Disp: , Rfl:    oxyCODONE-acetaminophen (PERCOCET/ROXICET) 5-325 MG tablet, Take 1 tablet by mouth every 6 (six) hours as needed for severe pain., Disp: 15 tablet, Rfl: 0   pantoprazole (PROTONIX) 40 MG tablet, Take 1 tablet (40 mg total) by mouth 2 (two) times daily before a meal., Disp: 60 tablet, Rfl: 2   polyethylene glycol (MIRALAX / GLYCOLAX) 17  g packet, Take 17 g by mouth 2 (two) times daily as needed for mild constipation., Disp: 14 each, Rfl: 0   potassium chloride SA (KLOR-CON M) 20 MEQ tablet, Take 1 tablet (20 mEq total) by mouth 2 (two) times daily for 2 days., Disp: 4 tablet, Rfl: 0   silver sulfADIAZINE (SILVADENE) 1 % cream, Apply topically 2 (two) times daily. (Patient taking differently: Apply 1 Application topically 2 (two) times daily as needed (blister burns).), Disp: 50 g, Rfl: 0   spironolactone (ALDACTONE) 100 MG tablet, Take 1 tablet (100 mg total) by mouth daily. (Patient not taking: Reported on 02/28/2022), Disp: 30 tablet, Rfl: 0   sucralfate (CARAFATE) 1 GM/10ML suspension, Take 10 mLs (1 g total) by mouth 4 (four) times daily -  with meals and at bedtime., Disp: 420 mL, Rfl: 0   tiZANidine (ZANAFLEX) 2 MG tablet, Take 1 tablet (2 mg total) by mouth every 12 (twelve) hours as needed for muscle spasms., Disp: 30 tablet, Rfl: 0   triamcinolone cream (KENALOG) 0.1 %, Apply 1 Application topically 2 (two) times daily. Only to hands, Disp: 30 g, Rfl: 0   vitamin B-12 (CYANOCOBALAMIN) 100 MCG tablet, Take 100 mcg by mouth daily., Disp: , Rfl:    zinc sulfate 220 (50 Zn) MG capsule, Take 1 capsule (220 mg total) by mouth daily., Disp: , Rfl:   Allergies  Allergen Reactions   Peanut-Containing Drug Products Anaphylaxis    Tree nuts          Objective:  Physical Exam  General: AAO x3, NAD  Dermatological: Dry, peeling, erythematous rash present to the feet.  There is superficial ulcer present on the medial aspect of the right ankle.  Ulcerations noted to the plantar aspects of bilateral second toes at the distal aspect with granular base without any probing to bone, undermining or tunneling.  There is no drainage or pus.  No fluctuance or crepitation but there is no noted.            Vascular: Dorsalis Pedis artery and Posterior Tibial artery pedal pulses are 1/4 bilateral with immedate capillary fill time.   There is no pain with calf compression, swelling, warmth, erythema.   Neruologic: Grossly intact via light touch bilateral.   Musculoskeletal: Hammertoes are present.  She has generalized discomfort to her feet bilaterally.  No specific area pinpoint tenderness.       Assessment:   66 year old female with hammertoe deformity, ulcers b/l 2nd toe; desquamative dermatitis      Plan:  -Treatment options discussed including all alternatives, risks, and complications -Etiology of symptoms were discussed -Reviewed the x-rays that she recently performed at the emergency room.  No evidence of osteomyelitis.  X-rays were obtained reviewed  of the right ankle given the wound there is no evidence of acute fracture, osteomyelitis.  No soft tissue edema. -The wounds we discussed flexor tenotomy today.  Will try to treat conservatively.  I dispensed Maxorb dressings which I applied today.  Also offloading pads were dispensed.  She is to monitor closely for signs or symptoms of infection should any occur the provide emergency room.  No improvement will proceed with flexor tenotomy. -Follow-up with dermatology for the rash on her feet and hands.  Referral placed.  Discussed she can use the steroid cream to the feet as well as long as there is no open wound to the area. -Previously had ABI- normal -Monitor for any clinical signs or symptoms of infection and directed to call the office immediately should any occur or go to the ER.  Return for ulcer  Vivi Barrack DPM

## 2022-03-10 ENCOUNTER — Other Ambulatory Visit: Payer: Self-pay

## 2022-03-10 ENCOUNTER — Other Ambulatory Visit: Payer: Self-pay | Admitting: Podiatry

## 2022-03-10 ENCOUNTER — Telehealth: Payer: Self-pay

## 2022-03-10 DIAGNOSIS — L97512 Non-pressure chronic ulcer of other part of right foot with fat layer exposed: Secondary | ICD-10-CM

## 2022-03-10 DIAGNOSIS — K7030 Alcoholic cirrhosis of liver without ascites: Secondary | ICD-10-CM

## 2022-03-10 DIAGNOSIS — M2041 Other hammer toe(s) (acquired), right foot: Secondary | ICD-10-CM

## 2022-03-10 DIAGNOSIS — L97522 Non-pressure chronic ulcer of other part of left foot with fat layer exposed: Secondary | ICD-10-CM

## 2022-03-10 DIAGNOSIS — L97311 Non-pressure chronic ulcer of right ankle limited to breakdown of skin: Secondary | ICD-10-CM

## 2022-03-10 DIAGNOSIS — R21 Rash and other nonspecific skin eruption: Secondary | ICD-10-CM

## 2022-03-10 DIAGNOSIS — M79671 Pain in right foot: Secondary | ICD-10-CM

## 2022-03-10 DIAGNOSIS — D649 Anemia, unspecified: Secondary | ICD-10-CM

## 2022-03-10 NOTE — Telephone Encounter (Signed)
Error

## 2022-03-13 ENCOUNTER — Emergency Department (HOSPITAL_COMMUNITY): Payer: Medicare PPO

## 2022-03-13 ENCOUNTER — Inpatient Hospital Stay (HOSPITAL_COMMUNITY)
Admission: EM | Admit: 2022-03-13 | Discharge: 2022-04-12 | DRG: 871 | Disposition: A | Discharge status: Skilled Nursing Facility | Payer: Medicare PPO | Attending: Internal Medicine | Admitting: Internal Medicine

## 2022-03-13 ENCOUNTER — Encounter (HOSPITAL_COMMUNITY): Payer: Self-pay

## 2022-03-13 DIAGNOSIS — E876 Hypokalemia: Secondary | ICD-10-CM | POA: Diagnosis not present

## 2022-03-13 DIAGNOSIS — Z23 Encounter for immunization: Secondary | ICD-10-CM

## 2022-03-13 DIAGNOSIS — Z681 Body mass index (BMI) 19 or less, adult: Secondary | ICD-10-CM

## 2022-03-13 DIAGNOSIS — A419 Sepsis, unspecified organism: Principal | ICD-10-CM

## 2022-03-13 DIAGNOSIS — D649 Anemia, unspecified: Secondary | ICD-10-CM

## 2022-03-13 DIAGNOSIS — D529 Folate deficiency anemia, unspecified: Secondary | ICD-10-CM | POA: Diagnosis present

## 2022-03-13 DIAGNOSIS — L89890 Pressure ulcer of other site, unstageable: Secondary | ICD-10-CM | POA: Diagnosis present

## 2022-03-13 DIAGNOSIS — K255 Chronic or unspecified gastric ulcer with perforation: Secondary | ICD-10-CM

## 2022-03-13 DIAGNOSIS — F419 Anxiety disorder, unspecified: Secondary | ICD-10-CM | POA: Diagnosis present

## 2022-03-13 DIAGNOSIS — I5033 Acute on chronic diastolic (congestive) heart failure: Secondary | ICD-10-CM | POA: Diagnosis not present

## 2022-03-13 DIAGNOSIS — K760 Fatty (change of) liver, not elsewhere classified: Secondary | ICD-10-CM | POA: Diagnosis present

## 2022-03-13 DIAGNOSIS — R739 Hyperglycemia, unspecified: Secondary | ICD-10-CM | POA: Diagnosis not present

## 2022-03-13 DIAGNOSIS — J101 Influenza due to other identified influenza virus with other respiratory manifestations: Secondary | ICD-10-CM | POA: Diagnosis not present

## 2022-03-13 DIAGNOSIS — D638 Anemia in other chronic diseases classified elsewhere: Secondary | ICD-10-CM | POA: Diagnosis present

## 2022-03-13 DIAGNOSIS — E871 Hypo-osmolality and hyponatremia: Secondary | ICD-10-CM | POA: Diagnosis present

## 2022-03-13 DIAGNOSIS — K219 Gastro-esophageal reflux disease without esophagitis: Secondary | ICD-10-CM | POA: Diagnosis present

## 2022-03-13 DIAGNOSIS — Z803 Family history of malignant neoplasm of breast: Secondary | ICD-10-CM

## 2022-03-13 DIAGNOSIS — Z8711 Personal history of peptic ulcer disease: Secondary | ICD-10-CM

## 2022-03-13 DIAGNOSIS — F509 Eating disorder, unspecified: Secondary | ICD-10-CM | POA: Diagnosis present

## 2022-03-13 DIAGNOSIS — I472 Ventricular tachycardia, unspecified: Secondary | ICD-10-CM | POA: Diagnosis not present

## 2022-03-13 DIAGNOSIS — F1020 Alcohol dependence, uncomplicated: Secondary | ICD-10-CM | POA: Diagnosis present

## 2022-03-13 DIAGNOSIS — R64 Cachexia: Secondary | ICD-10-CM | POA: Diagnosis present

## 2022-03-13 DIAGNOSIS — D62 Acute posthemorrhagic anemia: Secondary | ICD-10-CM | POA: Diagnosis not present

## 2022-03-13 DIAGNOSIS — M7989 Other specified soft tissue disorders: Secondary | ICD-10-CM | POA: Diagnosis not present

## 2022-03-13 DIAGNOSIS — L308 Other specified dermatitis: Secondary | ICD-10-CM

## 2022-03-13 DIAGNOSIS — K130 Diseases of lips: Secondary | ICD-10-CM | POA: Diagnosis present

## 2022-03-13 DIAGNOSIS — E43 Unspecified severe protein-calorie malnutrition: Secondary | ICD-10-CM | POA: Diagnosis present

## 2022-03-13 DIAGNOSIS — K860 Alcohol-induced chronic pancreatitis: Secondary | ICD-10-CM | POA: Diagnosis present

## 2022-03-13 DIAGNOSIS — B379 Candidiasis, unspecified: Secondary | ICD-10-CM | POA: Diagnosis not present

## 2022-03-13 DIAGNOSIS — R634 Abnormal weight loss: Secondary | ICD-10-CM

## 2022-03-13 DIAGNOSIS — L26 Exfoliative dermatitis: Secondary | ICD-10-CM | POA: Diagnosis present

## 2022-03-13 DIAGNOSIS — R2231 Localized swelling, mass and lump, right upper limb: Secondary | ICD-10-CM | POA: Insufficient documentation

## 2022-03-13 DIAGNOSIS — R54 Age-related physical debility: Secondary | ICD-10-CM | POA: Diagnosis present

## 2022-03-13 DIAGNOSIS — Z9884 Bariatric surgery status: Secondary | ICD-10-CM

## 2022-03-13 DIAGNOSIS — J690 Pneumonitis due to inhalation of food and vomit: Secondary | ICD-10-CM | POA: Diagnosis not present

## 2022-03-13 DIAGNOSIS — M6258 Muscle wasting and atrophy, not elsewhere classified, other site: Secondary | ICD-10-CM | POA: Diagnosis present

## 2022-03-13 DIAGNOSIS — M810 Age-related osteoporosis without current pathological fracture: Secondary | ICD-10-CM | POA: Diagnosis present

## 2022-03-13 DIAGNOSIS — I4729 Other ventricular tachycardia: Secondary | ICD-10-CM | POA: Insufficient documentation

## 2022-03-13 DIAGNOSIS — R6520 Severe sepsis without septic shock: Secondary | ICD-10-CM | POA: Diagnosis present

## 2022-03-13 DIAGNOSIS — M869 Osteomyelitis, unspecified: Secondary | ICD-10-CM | POA: Diagnosis present

## 2022-03-13 DIAGNOSIS — Z79899 Other long term (current) drug therapy: Secondary | ICD-10-CM

## 2022-03-13 DIAGNOSIS — T380X5A Adverse effect of glucocorticoids and synthetic analogues, initial encounter: Secondary | ICD-10-CM | POA: Diagnosis not present

## 2022-03-13 DIAGNOSIS — J9601 Acute respiratory failure with hypoxia: Secondary | ICD-10-CM | POA: Diagnosis present

## 2022-03-13 DIAGNOSIS — G894 Chronic pain syndrome: Secondary | ICD-10-CM | POA: Diagnosis present

## 2022-03-13 DIAGNOSIS — Z66 Do not resuscitate: Secondary | ICD-10-CM | POA: Diagnosis present

## 2022-03-13 DIAGNOSIS — L03113 Cellulitis of right upper limb: Secondary | ICD-10-CM | POA: Diagnosis present

## 2022-03-13 DIAGNOSIS — E559 Vitamin D deficiency, unspecified: Secondary | ICD-10-CM | POA: Diagnosis present

## 2022-03-13 DIAGNOSIS — Z9049 Acquired absence of other specified parts of digestive tract: Secondary | ICD-10-CM

## 2022-03-13 DIAGNOSIS — L03116 Cellulitis of left lower limb: Principal | ICD-10-CM

## 2022-03-13 DIAGNOSIS — R197 Diarrhea, unspecified: Secondary | ICD-10-CM | POA: Diagnosis not present

## 2022-03-13 DIAGNOSIS — K7031 Alcoholic cirrhosis of liver with ascites: Secondary | ICD-10-CM | POA: Diagnosis not present

## 2022-03-13 DIAGNOSIS — E039 Hypothyroidism, unspecified: Secondary | ICD-10-CM | POA: Diagnosis present

## 2022-03-13 DIAGNOSIS — I5032 Chronic diastolic (congestive) heart failure: Secondary | ICD-10-CM

## 2022-03-13 HISTORY — DX: Other specified dermatitis: L30.8

## 2022-03-13 LAB — CBC
HCT: 25.4 % — ABNORMAL LOW (ref 36.0–46.0)
Hemoglobin: 8.5 g/dL — ABNORMAL LOW (ref 12.0–15.0)
MCH: 32.4 pg (ref 26.0–34.0)
MCHC: 33.5 g/dL (ref 30.0–36.0)
MCV: 96.9 fL (ref 80.0–100.0)
Platelets: 152 10*3/uL (ref 150–400)
RBC: 2.62 MIL/uL — ABNORMAL LOW (ref 3.87–5.11)
RDW: 14.3 % (ref 11.5–15.5)
WBC: 22.9 10*3/uL — ABNORMAL HIGH (ref 4.0–10.5)
nRBC: 0 % (ref 0.0–0.2)

## 2022-03-13 LAB — BASIC METABOLIC PANEL
Anion gap: 5 (ref 5–15)
BUN: 24 mg/dL — ABNORMAL HIGH (ref 8–23)
CO2: 24 mmol/L (ref 22–32)
Calcium: 7.6 mg/dL — ABNORMAL LOW (ref 8.9–10.3)
Chloride: 104 mmol/L (ref 98–111)
Creatinine, Ser: 1.02 mg/dL — ABNORMAL HIGH (ref 0.44–1.00)
GFR, Estimated: >60 mL/min (ref >60)
Glucose, Bld: 141 mg/dL — ABNORMAL HIGH (ref 70–99)
Potassium: 5.4 mmol/L — ABNORMAL HIGH (ref 3.5–5.1)
Sodium: 133 mmol/L — ABNORMAL LOW (ref 135–145)

## 2022-03-13 LAB — LACTIC ACID, PLASMA
Lactic Acid, Venous: 3.3 mmol/L (ref 0.5–1.9)
Lactic Acid, Venous: 1.6 mmol/L (ref 0.5–1.9)

## 2022-03-13 LAB — SEDIMENTATION RATE: Sed Rate: 25 mm/hr — ABNORMAL HIGH (ref 0–22)

## 2022-03-13 MED ORDER — VANCOMYCIN HCL IN DEXTROSE 1-5 GM/200ML-% IV SOLN
1000.0000 mg | Freq: Once | INTRAVENOUS | Status: AC
  Administered 2022-03-13: 1000 mg via INTRAVENOUS
  Filled 2022-03-13: qty 200

## 2022-03-13 MED ORDER — SODIUM CHLORIDE 0.9 % IV SOLN
2.0000 g | INTRAVENOUS | Status: AC
  Administered 2022-03-13 – 2022-03-18 (×6): 2 g via INTRAVENOUS
  Filled 2022-03-13 (×7): qty 20

## 2022-03-13 MED ORDER — MORPHINE SULFATE (PF) 2 MG/ML IV SOLN
2.0000 mg | Freq: Once | INTRAVENOUS | Status: AC
  Administered 2022-03-13: 2 mg via INTRAVENOUS
  Filled 2022-03-13: qty 1

## 2022-03-13 MED ORDER — SODIUM CHLORIDE 0.9 % IV BOLUS (SEPSIS)
500.0000 mL | Freq: Once | INTRAVENOUS | Status: AC
  Administered 2022-03-13: 500 mL via INTRAVENOUS

## 2022-03-13 MED ORDER — PANTOPRAZOLE SODIUM 40 MG PO TBEC
40.0000 mg | DELAYED_RELEASE_TABLET | Freq: Two times a day (BID) | ORAL | Status: DC
  Administered 2022-03-13 – 2022-03-22 (×18): 40 mg via ORAL
  Filled 2022-03-13 (×18): qty 1

## 2022-03-13 MED ORDER — VANCOMYCIN HCL 500 MG/100ML IV SOLN
500.0000 mg | INTRAVENOUS | Status: DC
  Administered 2022-03-14 – 2022-03-18 (×5): 500 mg via INTRAVENOUS
  Filled 2022-03-13 (×6): qty 100

## 2022-03-13 MED ORDER — PANCRELIPASE (LIP-PROT-AMYL) 12000-38000 UNITS PO CPEP
24000.0000 [IU] | ORAL_CAPSULE | Freq: Three times a day (TID) | ORAL | Status: DC
  Administered 2022-03-14 – 2022-04-12 (×76): 24000 [IU] via ORAL
  Filled 2022-03-13 (×82): qty 2

## 2022-03-13 MED ORDER — LACTATED RINGERS IV SOLN: INTRAVENOUS | Status: AC

## 2022-03-13 MED ORDER — OXYCODONE-ACETAMINOPHEN 5-325 MG PO TABS
1.0000 | ORAL_TABLET | Freq: Once | ORAL | Status: AC
  Administered 2022-03-13: 1 via ORAL
  Filled 2022-03-13: qty 1

## 2022-03-13 MED ORDER — ENSURE ENLIVE PO LIQD
237.0000 mL | Freq: Three times a day (TID) | ORAL | Status: DC
  Administered 2022-03-14 – 2022-04-12 (×80): 237 mL via ORAL
  Filled 2022-03-13 (×8): qty 237

## 2022-03-13 MED ORDER — MUPIROCIN CALCIUM 2 % EX CREA
TOPICAL_CREAM | Freq: Once | CUTANEOUS | Status: DC
  Filled 2022-03-13: qty 15

## 2022-03-13 NOTE — Consult Note (Signed)
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  MRI of both feet performed without definitive evidence of osteomyelitis.  Would benefit from inpatient dermatology which is not available in our system.  Unfortunately [facility], [facility], [facility], and [facility] are closed to transfers.  We are asked to assist with comanagement while transfer can be arranged. -Agree with IV vancomycin and ceftriaxone, continue -Continue IV fluid hydration as ordered -Consult to wound care for any further assistance with skin care  Chronic alcoholic pancreatitis: Stable.  Continue Creon with meals.  Hepatic cirrhosis: Follows with [facility].  BP has been soft so we will hold spironolactone and Lasix.  No sign of significant abdominal ascites or encephalopathy.  Chronic gastric ulcer with contained perforation: Appears stable.  Continue Protonix 40 mg PO BID. avoid blood thinners.  Anemia of chronic disease: Hemoglobin stable.  Malnutrition: Consult to dietitian.  [facility] will continue to follow the patient.  HPI:  Carolyn [name] is a 66 y.o. female with past medical history significant for chronic alcoholic pancreatitis, chronic gastric ulcer with contained perforation, chronic pain, malnutrition who presented to the ED for evaluation of worsening skin peeling and skin changes to her feet and hands.  Patient reports issues with peeling skin and wounds which has been progressively worsening over the last 2  months.  She says she saw a dermatologist in [place] without formal diagnosis but had follow-up for possible culture or biopsy scheduled.  She was prescribed a topical ointment and given an injection without any improvement.  She says initially the wounds began as a bulla on the bottom of her feet.  These have desquamated and she is having continued skin loss on the palms and soles as well as the dorsum of the hands and feet.  She has multiple wounds in these areas with serous discharge from her feet.  She has some overlying erythema on the feet and hands as well.  She has chronic appearing ulcerated lesions/wounds at the tip of her second toes on both feet.  She has linear wounds at the flexural aspects of all of her fingers.  She reports chronic pain related to chronic pancreatitis but is having acute pain at the areas of her wounds on her feet and hands.  She denies any subjective fevers, chills, diaphoresis, chest pain, dyspnea.               Labs notable for WBC 22.9, lactic acid 3.3, ESR 25, hemoglobin 8.5.  Tachycardic with rate >100.  Review of Systems: As mentioned in the history of present illness. All other systems reviewed and are negative. Past Medical History:  Diagnosis Date   Alcoholic cirrhosis ([place])    Chronic pancreatitis ([place])    Endometriosis    GERD (gastroesophageal reflux disease)    Liver disease    Malabsorption    MALABSORPTION SYNDROME   Osteoporosis 03/2011   t score -2.5   Pancreatitis    due to cyst and tumors due to calcifications   Seizure ([place])    no seizure disorder   Vitamin D deficiency 2012  VIT D 15   Past Surgical History:  Procedure Laterality Date   APPENDECTOMY     BIOPSY  01/19/2022   Procedure: BIOPSY;  Surgeon: Lavena Bullion, DO;  Location: [facility];  Service: Gastroenterology;;   CHOLECYSTECTOMY     ESOPHAGOGASTRODUODENOSCOPY (EGD) WITH PROPOFOL N/A 01/19/2022   Procedure: ESOPHAGOGASTRODUODENOSCOPY (EGD) WITH  PROPOFOL;  Surgeon: Lavena Bullion, DO;  Location: [facility];  Service: Gastroenterology;  Laterality: N/A;   FEEDING TUBE RELOCATION  2010   JEJUNOSTOMY FEEDING TUBE  1990   MULTIPLE GASTRIC SURGERIES     MYOMECTOMY     OPEN REDUCTION INTERNAL FIXATION (ORIF) DISTAL RADIAL FRACTURE Left 06/29/2018   Procedure: OPEN REDUCTION INTERNAL FIXATION (ORIF)LEFT  DISTAL RADIAL FRACTURE;  Surgeon: Leanora Cover, MD;  Location: [facility];  Service: Orthopedics;  Laterality: Left;   ROUX-EN-Y GASTRIC BYPASS     TUBAL LIGATION     Social History:  reports that she has never smoked. She has never used smokeless tobacco. She reports current alcohol use. She reports that she does not use drugs.  Allergies  Allergen Reactions   Peanut-Containing Drug Products Anaphylaxis    Tree nuts    Family History  Problem Relation Age of Onset   Cancer Father        BONE   Breast cancer Maternal Grandmother     Prior to Admission medications   Medication Sig Start Date End Date Taking? Authorizing Provider  buprenorphine (BUTRANS) 20 MCG/HR PTWK Place 1 patch onto the skin once a week.    [provider]  famotidine (PEPCID) 10 MG tablet Take 10 mg by mouth 2 (two) times daily.    [provider]  feeding supplement (ENSURE ENLIVE / ENSURE PLUS) LIQD Take 237 mLs by mouth 3 (three) times daily between meals. 01/25/22   Alma Friendly, MD  furosemide (LASIX) 40 MG tablet Take 1 tablet (40 mg total) by mouth daily. Patient taking differently: Take 40 mg by mouth daily as needed for fluid or edema. 02/18/22   Thornton Park, MD  Hyoscyamine Sulfate SL (LEVSIN/SL) 0.125 MG SUBL Place 1 each under the tongue every 6(six) hours as needed. Patient not taking: Reported on 02/28/2022 02/11/22   Esterwood, Amy S, PA-C  lidocaine (XYLOCAINE) 2 % solution Use as directed 15 mLs in the mouth or throat every 6 (six) hours as needed for mouth pain. 09/05/20   Fatima Blank, MD  lipase/protease/amylase (CREON) 12000-38000 units CPEP capsule Take 2 capsules (24,000 Units total) by mouth 3 (three) times daily with meals. 02/11/22   Esterwood, Amy S, PA-C  Multiple Vitamin (MULITIVITAMIN WITH MINERALS) TABS Take 1 tablet by mouth daily.    [provider]  ondansetron (ZOFRAN) 4 MG tablet Take 4 mg by mouth every 8 (eight) hours as needed for nausea or vomiting.    [provider]  oxyCODONE-acetaminophen (PERCOCET/ROXICET) 5-325 MG tablet Take 1 tablet by mouth every 6 (six) hours as needed for severe pain. 02/28/22   Horton, Barbette Hair, MD  pantoprazole (PROTONIX) 40 MG tablet Take 1 tablet (40 mg total) by mouth 2 (two) times daily before a meal. 02/11/22   Esterwood, Amy S, PA-C  polyethylene glycol (MIRALAX / GLYCOLAX) 17 g packet Take 17 g by mouth 2 (two) times daily as needed for mild constipation. 01/25/22   Alma Friendly, MD  potassium chloride SA (KLOR-CON M) 20 MEQ tablet Take 1 tablet (20 mEq total) by mouth 2 (two) times daily  for 2 days. 02/18/22 02/20/22  Thornton Park, MD  silver sulfADIAZINE (SILVADENE) 1 % cream Apply topically 2 (two) times daily. Patient taking differently: Apply 1 Application topically 2 (two) times daily as needed (blister burns). 01/25/22   Alma Friendly, MD  spironolactone (ALDACTONE) 100 MG tablet Take 1 tablet (100 mg total) by mouth daily. Patient not taking: Reported on 02/28/2022 02/18/22 03/20/22  Thornton Park, MD  sucralfate (CARAFATE) 1 GM/10ML suspension Take 10 mLs (1 g total) by mouth 4 (four) times daily -  with meals and at bedtime. 01/25/22   Alma Friendly, MD  tiZANidine (ZANAFLEX) 2 MG tablet Take 1 tablet (2 mg total) by mouth every 12 (twelve) hours as needed for muscle spasms. 01/25/22   Alma Friendly, MD  triamcinolone cream (KENALOG) 0.1 % Apply 1 Application topically 2 (two) times daily. Only to hands 02/28/22   Horton, Barbette Hair, MD  vitamin B-12  (CYANOCOBALAMIN) 100 MCG tablet Take 100 mcg by mouth daily.    [provider]  zinc sulfate 220 (50 Zn) MG capsule Take 1 capsule (220 mg total) by mouth daily. 01/09/22   Mercy Riding, MD  PROMETHAZINE HCL PO Take 4 mg by mouth.   06/06/11  [provider]  Ranitidine HCl (ZANTAC PO) Take by mouth.    06/06/11  [provider]    Physical Exam: Vitals:   03/13/22 1745 03/13/22 1800 03/13/22 1945 03/13/22 2000  BP: 94/66 107/64 110/70 131/76  Pulse: 90 78 96 (!) 103  Resp:  _0 Temp:      TempSrc:      SpO2: 100% 100% 100% 100%   General: Chronically ill-appearing cachectic woman resting in bed, appears fatigued and uncomfortable HEENT: EOMI, no scleral icterus Cardiac: RRR, no rubs, murmurs or gallops Pulm: clear to auscultation bilaterally, moving normal volumes of air Abd: soft, nontender, nondistended Ext: Desquamated dermatitis of the hands and feet on the dorsum as well as palms/soles with linear wounds at flexor sites, chronic appearing ulcers distal tips of second toes on both feet, serous discharge from the feet, erythema overlying hands and feet.  See pictures above in the media tab for further reference. Neuro: alert and oriented X3  Data Reviewed:   There are no new results to review at this time.    Family Communication: Discussed with patient, she has discussed with family Primary team communication: Discussed with Dr. Armandina Gemma. Thank you very much for involving [place] in the care of your patient.  Author: Zada Finders, MD 03/13/2022 9:24 PM  For on call review [email or web address].

## 2022-03-13 NOTE — Progress Notes (Signed)
A consult was received from an ED provider for Vancomycin per pharmacy dosing.  The patient's profile has been reviewed for ht/wt/allergies/indication/available labs.   A one time order has been placed for Vancomycin 1g IV.  Further antibiotics/pharmacy consults should be ordered by admitting physician if indicated.                       Thank you, Jamse Mead 03/13/2022  7:14 PM

## 2022-03-13 NOTE — Progress Notes (Addendum)
Pharmacy Antibiotic Note  Carolyn [name] is a 66 y.o. female presented to [facility] ED on 03/13/2022 for sepsis secondary to cellulitis, possible osteomyelitis. Pharmacy has been consulted for Vancomycin dosing.  Plan: Vancomycin 1g IV x 1 ordered in the ED. Continue with Vancomycin 500mg  IV q24h thereafter. Vancomycin levels at steady state, as indicated.  Monitor renal function, cultures, clinical course.      Temp (24hrs), Avg:98.5 F (36.9 C), Min:98.1 F (36.7 C), Max:98.9 F (37.2 C)  Recent Labs  Lab 03/13/22 1520 03/13/22 1913  WBC 22.9*  --   CREATININE 1.02*  --   LATICACIDVEN 3.3* 1.6    Estimated Creatinine Clearance: 35.9 mL/min (A) (by C-G formula based on SCr of 1.02 mg/dL (H)).    Allergies  Allergen Reactions   Peanut-Containing Drug Products Anaphylaxis    Tree nuts    Antimicrobials this admission: 12/30 Ceftriaxone >> 12/30 Vancomycin >>  Dose adjustments this admission: --  Microbiology results: 12/30 BCx:    Thank you for allowing pharmacy to be a part of this patient's care.   1/31, PharmD, BCPS Clinical Pharmacist  03/13/2022 8:51 PM

## 2022-03-13 NOTE — ED Triage Notes (Signed)
Pt presents with c/o peeling skin on both hands and both feet for several weeks. Pt has been seen by dermatology, unsure as to what is going on at this time.

## 2022-03-13 NOTE — ED Notes (Signed)
Pt 22g IV came out during attempt at lab collection. IV attempted x2 without success.

## 2022-03-13 NOTE — ED Provider Notes (Signed)
[facility] DEPT Provider Note   CSN: [id number] Arrival date & time: 03/13/22  1751     History  Chief Complaint  Patient presents with   Skin Problem    Carolyn [name] is a 66 y.o. female.  Past medical history of chronic pancreatitis, alcohol cirrhosis of the liver, anemia, chronic gastric ulcer.  She presents to the emergency department today Because she has had worsening of swelling to her feet and hands.  She was in the hospital and discharged on 01/25/2022 for her cellulitis of her toe.  She was on antibiotics, follow-up with podiatry.  Prior to that admission she had a large blister on her foot that was not associated with any injury or pressure to the area.  They state throughout her hospitalization she got some skin peeling of her feet and hands.  She follow-up with podiatry who discussed that she needed to see dermatology as well due to the rash.  She saw them last week.  She states that she saw [facility].  They took a culture of her wound and gave her a shot of steroids and topical steroid ointment.  She has an appointment for follow-up in another week but her wounds have gotten worse, they states the skin on her feet have split open and is now leaking clear fluid, her wounds on her hands have gotten worse as well.  She is also having ulcerations to her bilateral legs and arms. HPI     Home Medications Prior to Admission medications   Medication Sig Start Date End Date Taking? Authorizing Provider  buprenorphine (BUTRANS) 20 MCG/HR PTWK Place 1 patch onto the skin once a week.    [provider]  famotidine (PEPCID) 10 MG tablet Take 10 mg by mouth 2 (two) times daily.    [provider]  feeding supplement (ENSURE ENLIVE / ENSURE PLUS) LIQD Take 237 mLs by mouth 3 (three) times daily between meals. 01/25/22   Alma Friendly, MD  furosemide (LASIX) 40 MG tablet Take 1 tablet (40 mg total) by mouth  daily. Patient taking differently: Take 40 mg by mouth daily as needed for fluid or edema. 02/18/22   Thornton Park, MD  Hyoscyamine Sulfate SL (LEVSIN/SL) 0.125 MG SUBL Place 1 each under the tongue every 6(six) hours as needed. Patient not taking: Reported on 02/28/2022 02/11/22   Esterwood, Amy S, PA-C  lidocaine (XYLOCAINE) 2 % solution Use as directed 15 mLs in the mouth or throat every 6 (six) hours as needed for mouth pain. 09/05/20   Fatima Blank, MD  lipase/protease/amylase (CREON) 12000-38000 units CPEP capsule Take 2 capsules (24,000 Units total) by mouth 3 (three) times daily with meals. 02/11/22   Esterwood, Amy S, PA-C  Multiple Vitamin (MULITIVITAMIN WITH MINERALS) TABS Take 1 tablet by mouth daily.    [provider]  ondansetron (ZOFRAN) 4 MG tablet Take 4 mg by mouth every 8 (eight) hours as needed for nausea or vomiting.    [provider]  oxyCODONE-acetaminophen (PERCOCET/ROXICET) 5-325 MG tablet Take 1 tablet by mouth every 6 (six) hours as needed for severe pain. 02/28/22   Horton, Barbette Hair, MD  pantoprazole (PROTONIX) 40 MG tablet Take 1 tablet (40 mg total) by mouth 2 (two) times daily before a meal. 02/11/22   Esterwood, Amy S, PA-C  polyethylene glycol (MIRALAX / GLYCOLAX) 17 g packet Take 17 g by mouth 2 (two) times daily as needed for mild constipation. 01/25/22  Alma Friendly, MD  potassium chloride SA (KLOR-CON M) 20 MEQ tablet Take 1 tablet (20 mEq total) by mouth 2 (two) times daily for 2 days. 02/18/22 02/20/22  Thornton Park, MD  silver sulfADIAZINE (SILVADENE) 1 % cream Apply topically 2 (two) times daily. Patient taking differently: Apply 1 Application topically 2 (two) times daily as needed (blister burns). 01/25/22   Alma Friendly, MD  spironolactone (ALDACTONE) 100 MG tablet Take 1 tablet (100 mg total) by mouth daily. Patient not taking: Reported on 02/28/2022 02/18/22 03/20/22  Thornton Park, MD  sucralfate  (CARAFATE) 1 GM/10ML suspension Take 10 mLs (1 g total) by mouth 4 (four) times daily -  with meals and at bedtime. 01/25/22   Alma Friendly, MD  tiZANidine (ZANAFLEX) 2 MG tablet Take 1 tablet (2 mg total) by mouth every 12 (twelve) hours as needed for muscle spasms. 01/25/22   Alma Friendly, MD  triamcinolone cream (KENALOG) 0.1 % Apply 1 Application topically 2 (two) times daily. Only to hands 02/28/22   Horton, Barbette Hair, MD  vitamin B-12 (CYANOCOBALAMIN) 100 MCG tablet Take 100 mcg by mouth daily.    [provider]  zinc sulfate 220 (50 Zn) MG capsule Take 1 capsule (220 mg total) by mouth daily. 01/09/22   Mercy Riding, MD  PROMETHAZINE HCL PO Take 4 mg by mouth.   06/06/11  [provider]  Ranitidine HCl (ZANTAC PO) Take by mouth.    06/06/11  [provider]      Allergies    Peanut-containing drug products    Review of Systems   Review of Systems  Physical Exam Updated Vital Signs BP 123/68   Pulse (!) 108   Temp 98.9 F (37.2 C) (Oral)   Resp 10   LMP 03/10/2009   SpO2 100%  Physical Exam Vitals and nursing note reviewed.  Constitutional:      General: She is not in acute distress.    Appearance: She is well-developed. She is ill-appearing.  HENT:     Head: Normocephalic and atraumatic.     Mouth/Throat:     Mouth: Mucous membranes are moist.  Eyes:     Conjunctiva/sclera: Conjunctivae normal.  Cardiovascular:     Rate and Rhythm: Normal rate and regular rhythm.     Heart sounds: No murmur heard. Pulmonary:     Effort: Pulmonary effort is normal. No respiratory distress.     Breath sounds: Normal breath sounds.  Abdominal:     Palpations: Abdomen is soft.     Tenderness: There is no abdominal tenderness.  Musculoskeletal:        General: No swelling.     Cervical back: Neck supple.  Skin:    General: Skin is warm and dry.     Capillary Refill: Capillary refill takes less than 2 seconds.  Neurological:     Mental  Status: She is alert.  Psychiatric:        Mood and Affect: Mood normal.            ED Results / Procedures / Treatments   Labs (all labs ordered are listed, but only abnormal results are displayed) Labs Reviewed  CBC - Abnormal; Notable for the following components:      Result Value   WBC 22.9 (*)    RBC 2.62 (*)    Hemoglobin 8.5 (*)    HCT 25.4 (*)    All other components within normal limits  BASIC METABOLIC PANEL -  Abnormal; Notable for the following components:   Sodium 133 (*)    Potassium 5.4 (*)    Glucose, Bld 141 (*)    BUN 24 (*)    Creatinine, Ser 1.02 (*)    Calcium 7.6 (*)    All other components within normal limits  LACTIC ACID, PLASMA - Abnormal; Notable for the following components:   Lactic Acid, Venous 3.3 (*)    All other components within normal limits  CULTURE, BLOOD (ROUTINE X 2)  CULTURE, BLOOD (ROUTINE X 2)  LACTIC ACID, PLASMA  SEDIMENTATION RATE  C-REACTIVE PROTEIN    EKG None  Radiology DG Foot Complete Right  Result Date: 03/13/2022 CLINICAL DATA:  Skin peeling over both feet. EXAM: RIGHT FOOT COMPLETE - 3 VIEW COMPARISON:  Right foot radiographs 02/28/2022 FINDINGS: No acute osseous abnormality is present. Chronic flexion deformity present at the great toe. Progressive soft tissue swelling is present over the dorsum of the foot near the MTP joints. No associated osseous abnormality is present. No gas is present in the soft tissues. IMPRESSION: 1. Progressive soft tissue swelling over the dorsum of the foot near the MTP joints. Findings are nonspecific. This may represent edema or cellulitis. 2. No acute osseous abnormality. Electronically Signed   By: San Morelle M.D.   On: 03/13/2022 10:46   DG Foot Complete Left  Result Date: 03/13/2022 CLINICAL DATA:  Peeling skin over the feet bilaterally. EXAM: LEFT FOOT - COMPLETE 3+ VIEW COMPARISON:  Left foot radiographs 02/28/2022. FINDINGS: Soft tissue swelling is now noted over  the dorsum of the foot. Bandage is in place. No acute or focal osseous abnormalities are present. Remote fracture at the base of the proximal phalanx in the great toe is stable. Mild osteopenia is again noted. IMPRESSION: 1. New soft tissue swelling over the dorsum of the foot consistent with edema or cellulitis. 2. No acute osseous abnormalities or significant change. Electronically Signed   By: San Morelle M.D.   On: 03/13/2022 10:43    Procedures .Critical Care  Performed by: Gwenevere Abbot, PA-C Authorized by: Gwenevere Abbot, PA-C   Critical care provider statement:    Critical care time (minutes):  30   Critical care was time spent personally by me on the following activities:  Development of treatment plan with patient or surrogate, discussions with consultants, evaluation of patient's response to treatment, examination of patient, ordering and review of laboratory studies, ordering and review of radiographic studies, ordering and performing treatments and interventions, pulse oximetry, re-evaluation of patient's condition, review of old charts and obtaining history from patient or surrogate     Medications Ordered in ED Medications  lactated ringers infusion (has no administration in time range)  cefTRIAXone (ROCEPHIN) 2 g in sodium chloride 0.9 % 100 mL IVPB (2 g Intravenous New Bag/Given 03/13/22 1650)  sodium chloride 0.9 % bolus 500 mL (500 mLs Intravenous New Bag/Given 03/13/22 1651)  oxyCODONE-acetaminophen (PERCOCET/ROXICET) 5-325 MG per tablet 1 tablet (1 tablet Oral Given 03/13/22 9357)    ED Course/ Medical Decision Making/ A&P                           Medical Decision Making This patient presents to the ED for concern of pain in the hands and feet with rash, this involves an extensive number of treatment options, and is a complaint that carries with it a high risk of complications and morbidity.  The differential diagnosis includes sepsis  cellulitis, bullous  pemphigus, pemphigus vulgaris, other   Co morbidities that complicate the patient evaluation  Alcoholic cirrhosis, gastric ulcer, anemia   Additional history obtained:  Additional history obtained from EMR External records from outside source obtained and reviewed including podiatry note outpatient, inpatient records   Lab Tests:  I Ordered, and personally interpreted labs.  The pertinent results include: Leukocytosis 22, ESR 24, lactate elevated initially at 3.3   Imaging Studies ordered:  I ordered imaging studies including MRI of bilateral feet I independently visualized and interpreted imaging which showed possible osteomyelitis I agree with the radiologist interpretation   Cardiac Monitoring: / EKG:  The patient was maintained on a cardiac monitor.  I personally viewed and interpreted the cardiac monitored which showed an underlying rhythm of: Sinus rhythm   Consultations Obtained:  I requested consultation with the hospitalist Dr. Zada Finders  and discussed lab and imaging findings as well as pertinent plan - they recommend: Transfer to hospital with availability of dermatology due to underlying dermatologic condition, presumably pemphigus vulgaris.  I also spoke with Dr. Alroy Dust the dermatologist to [facility].  She is unable to give additional Lasix without evaluate the patient or seeing pictures that stated we could cover the open skin areas with either Silvadene or mupirocin, continue treating her infection and she may ultimately need steroids for her underlying dermatologic condition.  She had suggested topical clobetasol cream 0.5% with the patient has areas of open skin so will not put this medication on her open skin. I spoke with transfer center at [facility], [facility] and [facility], they have no availability of beds and will current wait list.  Will have to continue to reach out.  Hospitalist did not feel comfortable keeping the patient on his  service but did come and consult and help manage patient.  Signed out to oncoming provider   Problem List / ED Course / Critical interventions / Medication management  Fever sepsis, cellulitis, possible osteomyelitis of the toes I ordered medication including Rocephin and vancomycin for cellulitis/osteomyelitis with sepsis Reevaluation of the patient after these medicines showed that the patient improved I have reviewed the patients home medicines and have made adjustments as needed     Amount and/or Complexity of Data Reviewed Labs: ordered. Radiology: ordered.  Risk Prescription drug management.           Final Clinical Impression(s) / ED Diagnoses Final diagnoses:  None    Rx / DC Orders ED Discharge Orders     None         Darci Current 03/13/22 2212    Regan Lemming, MD 03/13/22 2319

## 2022-03-13 NOTE — ED Provider Triage Note (Cosign Needed)
Emergency Medicine Provider Triage Evaluation Note  Carolyn [name] , a 66 y.o. female  was evaluated in triage.  Pt complains of peeling skin on hands, feet for several weeks, has been in the hospital recently for infections of the feet, received IV antibiotics, and treatment for bleeding ulcer, but has been out of the hospital for around 4 weeks.  Reports she saw a dermatologist but they are unsure what was going on at this time..  Review of Systems  Positive: Skin irritation, infection Negative: Nausea, vomiting, fever  Physical Exam  BP 111/81 (BP Location: Left Arm)   Pulse (!) 102   Temp 98.1 F (36.7 C) (Oral)   Resp 20   LMP 03/10/2009   SpO2 100%  Gen:   Awake, no distress   Resp:  Normal effort  MSK:   Moves extremities without difficulty  Other:  Thing red peeling rash on bilateral hands and feet, she has some chronic appearing wounds on bilateral feet that she reports are from before her hospital stay  Medical Decision Making  Medically screening exam initiated at 9:41 AM.  Appropriate orders placed.  EVOLEHT HOVATTER was informed that the remainder of the evaluation will be completed by another provider, this initial triage assessment does not replace that evaluation, and the importance of remaining in the ED until their evaluation is complete.  Workup initiated   Olene Floss, [place] 03/13/22 1610

## 2022-03-13 NOTE — ED Notes (Signed)
Patient requesting lab draw with IV start. 

## 2022-03-13 NOTE — ED Notes (Signed)
Unable to collect labs. Small veins. X1 attempt

## 2022-03-13 NOTE — Progress Notes (Signed)
Elink is following code sepsis 

## 2022-03-13 NOTE — ED Notes (Signed)
Patient transported to MRI 

## 2022-03-14 DIAGNOSIS — L308 Other specified dermatitis: Secondary | ICD-10-CM

## 2022-03-14 DIAGNOSIS — D62 Acute posthemorrhagic anemia: Secondary | ICD-10-CM | POA: Diagnosis not present

## 2022-03-14 DIAGNOSIS — K860 Alcohol-induced chronic pancreatitis: Secondary | ICD-10-CM

## 2022-03-14 DIAGNOSIS — Z23 Encounter for immunization: Secondary | ICD-10-CM | POA: Diagnosis present

## 2022-03-14 DIAGNOSIS — R6520 Severe sepsis without septic shock: Secondary | ICD-10-CM | POA: Diagnosis not present

## 2022-03-14 DIAGNOSIS — J9601 Acute respiratory failure with hypoxia: Secondary | ICD-10-CM | POA: Diagnosis not present

## 2022-03-14 DIAGNOSIS — E039 Hypothyroidism, unspecified: Secondary | ICD-10-CM | POA: Diagnosis present

## 2022-03-14 DIAGNOSIS — K255 Chronic or unspecified gastric ulcer with perforation: Secondary | ICD-10-CM | POA: Diagnosis not present

## 2022-03-14 DIAGNOSIS — D638 Anemia in other chronic diseases classified elsewhere: Secondary | ICD-10-CM | POA: Diagnosis not present

## 2022-03-14 DIAGNOSIS — M869 Osteomyelitis, unspecified: Secondary | ICD-10-CM | POA: Diagnosis present

## 2022-03-14 DIAGNOSIS — Z515 Encounter for palliative care: Secondary | ICD-10-CM | POA: Diagnosis not present

## 2022-03-14 DIAGNOSIS — L03113 Cellulitis of right upper limb: Secondary | ICD-10-CM | POA: Diagnosis present

## 2022-03-14 DIAGNOSIS — E43 Unspecified severe protein-calorie malnutrition: Secondary | ICD-10-CM | POA: Diagnosis present

## 2022-03-14 DIAGNOSIS — K861 Other chronic pancreatitis: Secondary | ICD-10-CM | POA: Diagnosis not present

## 2022-03-14 DIAGNOSIS — R634 Abnormal weight loss: Secondary | ICD-10-CM | POA: Diagnosis not present

## 2022-03-14 DIAGNOSIS — R64 Cachexia: Secondary | ICD-10-CM | POA: Diagnosis present

## 2022-03-14 DIAGNOSIS — Z66 Do not resuscitate: Secondary | ICD-10-CM | POA: Diagnosis present

## 2022-03-14 DIAGNOSIS — L89890 Pressure ulcer of other site, unstageable: Secondary | ICD-10-CM | POA: Diagnosis present

## 2022-03-14 DIAGNOSIS — Z681 Body mass index (BMI) 19 or less, adult: Secondary | ICD-10-CM | POA: Diagnosis not present

## 2022-03-14 DIAGNOSIS — F1020 Alcohol dependence, uncomplicated: Secondary | ICD-10-CM | POA: Diagnosis present

## 2022-03-14 DIAGNOSIS — K7030 Alcoholic cirrhosis of liver without ascites: Secondary | ICD-10-CM | POA: Diagnosis not present

## 2022-03-14 DIAGNOSIS — E871 Hypo-osmolality and hyponatremia: Secondary | ICD-10-CM | POA: Diagnosis present

## 2022-03-14 DIAGNOSIS — D500 Iron deficiency anemia secondary to blood loss (chronic): Secondary | ICD-10-CM | POA: Diagnosis not present

## 2022-03-14 DIAGNOSIS — J690 Pneumonitis due to inhalation of food and vomit: Secondary | ICD-10-CM | POA: Diagnosis not present

## 2022-03-14 DIAGNOSIS — A419 Sepsis, unspecified organism: Secondary | ICD-10-CM | POA: Diagnosis present

## 2022-03-14 DIAGNOSIS — L26 Exfoliative dermatitis: Secondary | ICD-10-CM | POA: Diagnosis present

## 2022-03-14 DIAGNOSIS — I472 Ventricular tachycardia, unspecified: Secondary | ICD-10-CM | POA: Diagnosis not present

## 2022-03-14 DIAGNOSIS — K7031 Alcoholic cirrhosis of liver with ascites: Secondary | ICD-10-CM | POA: Diagnosis not present

## 2022-03-14 DIAGNOSIS — L03116 Cellulitis of left lower limb: Secondary | ICD-10-CM | POA: Diagnosis present

## 2022-03-14 DIAGNOSIS — I5033 Acute on chronic diastolic (congestive) heart failure: Secondary | ICD-10-CM | POA: Diagnosis not present

## 2022-03-14 DIAGNOSIS — K760 Fatty (change of) liver, not elsewhere classified: Secondary | ICD-10-CM | POA: Diagnosis present

## 2022-03-14 DIAGNOSIS — M7989 Other specified soft tissue disorders: Secondary | ICD-10-CM | POA: Diagnosis present

## 2022-03-14 DIAGNOSIS — D529 Folate deficiency anemia, unspecified: Secondary | ICD-10-CM | POA: Diagnosis present

## 2022-03-14 LAB — CBC
HCT: 22.5 % — ABNORMAL LOW (ref 36.0–46.0)
HCT: 21.6 % — ABNORMAL LOW (ref 36.0–46.0)
Hemoglobin: 7.4 g/dL — ABNORMAL LOW (ref 12.0–15.0)
Hemoglobin: 7.1 g/dL — ABNORMAL LOW (ref 12.0–15.0)
MCH: 31.5 pg (ref 26.0–34.0)
MCH: 31.8 pg (ref 26.0–34.0)
MCHC: 32.9 g/dL (ref 30.0–36.0)
MCHC: 32.9 g/dL (ref 30.0–36.0)
MCV: 95.7 fL (ref 80.0–100.0)
MCV: 96.9 fL (ref 80.0–100.0)
Platelets: 164 10*3/uL (ref 150–400)
Platelets: 194 10*3/uL (ref 150–400)
RBC: 2.35 MIL/uL — ABNORMAL LOW (ref 3.87–5.11)
RBC: 2.23 MIL/uL — ABNORMAL LOW (ref 3.87–5.11)
RDW: 14.2 % (ref 11.5–15.5)
RDW: 14.4 % (ref 11.5–15.5)
WBC: 18.1 10*3/uL — ABNORMAL HIGH (ref 4.0–10.5)
WBC: 17.2 10*3/uL — ABNORMAL HIGH (ref 4.0–10.5)
nRBC: 0 % (ref 0.0–0.2)
nRBC: 0 % (ref 0.0–0.2)

## 2022-03-14 LAB — BASIC METABOLIC PANEL
Anion gap: 6 (ref 5–15)
BUN: 25 mg/dL — ABNORMAL HIGH (ref 8–23)
CO2: 25 mmol/L (ref 22–32)
Calcium: 7.5 mg/dL — ABNORMAL LOW (ref 8.9–10.3)
Chloride: 107 mmol/L (ref 98–111)
Creatinine, Ser: 0.8 mg/dL (ref 0.44–1.00)
GFR, Estimated: >60 mL/min (ref >60)
Glucose, Bld: 90 mg/dL (ref 70–99)
Potassium: 4.2 mmol/L (ref 3.5–5.1)
Sodium: 138 mmol/L (ref 135–145)

## 2022-03-14 LAB — VITAMIN B12: Vitamin B-12: 732 pg/mL (ref 180–914)

## 2022-03-14 LAB — CBC WITH DIFFERENTIAL/PLATELET
Abs Immature Granulocytes: 0.11 10*3/uL — ABNORMAL HIGH (ref 0.00–0.07)
Basophils Absolute: 0 10*3/uL (ref 0.0–0.1)
Basophils Relative: 0 %
Eosinophils Absolute: 0 10*3/uL (ref 0.0–0.5)
Eosinophils Relative: 0 %
HCT: 21.2 % — ABNORMAL LOW (ref 36.0–46.0)
Hemoglobin: 7 g/dL — ABNORMAL LOW (ref 12.0–15.0)
Immature Granulocytes: 1 %
Lymphocytes Relative: 9 %
Lymphs Abs: 1.4 10*3/uL (ref 0.7–4.0)
MCH: 32 pg (ref 26.0–34.0)
MCHC: 33 g/dL (ref 30.0–36.0)
MCV: 96.8 fL (ref 80.0–100.0)
Monocytes Absolute: 0.7 10*3/uL (ref 0.1–1.0)
Monocytes Relative: 4 %
Neutro Abs: 14.6 10*3/uL — ABNORMAL HIGH (ref 1.7–7.7)
Neutrophils Relative %: 86 %
Platelets: 164 10*3/uL (ref 150–400)
RBC: 2.19 MIL/uL — ABNORMAL LOW (ref 3.87–5.11)
RDW: 14.1 % (ref 11.5–15.5)
WBC: 16.9 10*3/uL — ABNORMAL HIGH (ref 4.0–10.5)
nRBC: 0 % (ref 0.0–0.2)

## 2022-03-14 LAB — CREATININE, SERUM
Creatinine, Ser: 0.97 mg/dL (ref 0.44–1.00)
GFR, Estimated: >60 mL/min (ref >60)

## 2022-03-14 LAB — FOLATE: Folate: 9.6 ng/mL (ref >5.9)

## 2022-03-14 MED ORDER — VITAMIN B-12 100 MCG PO TABS
100.0000 ug | ORAL_TABLET | Freq: Every day | ORAL | Status: DC
  Administered 2022-03-14 – 2022-04-12 (×30): 100 ug via ORAL
  Filled 2022-03-14 (×30): qty 1

## 2022-03-14 MED ORDER — ZINC SULFATE 220 (50 ZN) MG PO CAPS
220.0000 mg | ORAL_CAPSULE | Freq: Every day | ORAL | Status: DC
  Administered 2022-03-14: 220 mg via ORAL
  Filled 2022-03-14: qty 1

## 2022-03-14 MED ORDER — HEPARIN SODIUM (PORCINE) 5000 UNIT/ML IJ SOLN
5000.0000 [IU] | Freq: Three times a day (TID) | INTRAMUSCULAR | Status: AC
  Administered 2022-03-14 – 2022-03-17 (×10): 5000 [IU] via SUBCUTANEOUS
  Filled 2022-03-14 (×10): qty 1

## 2022-03-14 MED ORDER — ONDANSETRON HCL 4 MG/2ML IJ SOLN
4.0000 mg | Freq: Four times a day (QID) | INTRAMUSCULAR | Status: DC | PRN
  Administered 2022-03-14 – 2022-04-03 (×10): 4 mg via INTRAVENOUS
  Filled 2022-03-14 (×10): qty 2

## 2022-03-14 MED ORDER — FENTANYL CITRATE PF 50 MCG/ML IJ SOSY
50.0000 ug | PREFILLED_SYRINGE | INTRAMUSCULAR | Status: AC | PRN
  Administered 2022-03-14 (×3): 50 ug via INTRAVENOUS
  Filled 2022-03-14 (×3): qty 1

## 2022-03-14 MED ORDER — NIACIN 500 MG PO TABS
500.0000 mg | ORAL_TABLET | Freq: Two times a day (BID) | ORAL | Status: AC
  Administered 2022-03-14 – 2022-03-19 (×10): 500 mg via ORAL
  Filled 2022-03-14 (×12): qty 1

## 2022-03-14 MED ORDER — B COMPLEX-C PO TABS: 1.0000 | ORAL_TABLET | Freq: Every day | ORAL | Status: DC

## 2022-03-14 MED ORDER — FAMOTIDINE 20 MG PO TABS
10.0000 mg | ORAL_TABLET | Freq: Two times a day (BID) | ORAL | Status: DC
  Administered 2022-03-14 – 2022-04-12 (×59): 10 mg via ORAL
  Filled 2022-03-14 (×60): qty 1

## 2022-03-14 MED ORDER — POTASSIUM CHLORIDE CRYS ER 20 MEQ PO TBCR: 20.0000 meq | EXTENDED_RELEASE_TABLET | Freq: Two times a day (BID) | ORAL | Status: DC

## 2022-03-14 MED ORDER — POLYETHYLENE GLYCOL 3350 17 G PO PACK
17.0000 g | PACK | Freq: Every day | ORAL | Status: DC | PRN
  Administered 2022-03-15 – 2022-03-19 (×4): 17 g via ORAL
  Filled 2022-03-14 (×4): qty 1

## 2022-03-14 MED ORDER — NIACIN 500 MG PO TABS: 500.0000 mg | ORAL_TABLET | Freq: Two times a day (BID) | ORAL | Status: DC

## 2022-03-14 MED ORDER — VITAMIN D 25 MCG (1000 UNIT) PO TABS
1000.0000 [IU] | ORAL_TABLET | Freq: Every day | ORAL | Status: DC
  Administered 2022-03-14 – 2022-04-12 (×30): 1000 [IU] via ORAL
  Filled 2022-03-14 (×30): qty 1

## 2022-03-14 MED ORDER — TIZANIDINE HCL 4 MG PO TABS
2.0000 mg | ORAL_TABLET | Freq: Two times a day (BID) | ORAL | Status: DC | PRN
  Administered 2022-03-18 – 2022-03-28 (×7): 2 mg via ORAL
  Filled 2022-03-14 (×7): qty 1

## 2022-03-14 MED ORDER — SODIUM CHLORIDE 0.9 % IV SOLN: 1.0000 mg | Freq: Every day | INTRAVENOUS | Status: DC

## 2022-03-14 MED ORDER — ADULT MULTIVITAMIN W/MINERALS CH
1.0000 | ORAL_TABLET | Freq: Every day | ORAL | Status: DC
  Administered 2022-03-14: 1 via ORAL
  Filled 2022-03-14: qty 1

## 2022-03-14 MED ORDER — ZINC SULFATE 220 (50 ZN) MG PO CAPS: 220.0000 mg | ORAL_CAPSULE | Freq: Every day | ORAL | Status: DC

## 2022-03-14 MED ORDER — THIAMINE HCL 100 MG/ML IJ SOLN
500.0000 mg | INTRAVENOUS | Status: DC
  Administered 2022-03-14: 500 mg via INTRAVENOUS
  Filled 2022-03-14 (×2): qty 5

## 2022-03-14 MED ORDER — SILVER SULFADIAZINE 1 % EX CREA
TOPICAL_CREAM | Freq: Every day | CUTANEOUS | Status: DC
  Administered 2022-03-21: 1 via TOPICAL
  Filled 2022-03-14 (×6): qty 50

## 2022-03-14 MED ORDER — DOCUSATE SODIUM 100 MG PO CAPS: 100.0000 mg | ORAL_CAPSULE | Freq: Two times a day (BID) | ORAL | Status: DC | PRN

## 2022-03-14 MED ORDER — ACETAMINOPHEN 325 MG PO TABS
650.0000 mg | ORAL_TABLET | ORAL | Status: DC | PRN
  Administered 2022-03-19: 650 mg via ORAL
  Filled 2022-03-14 (×3): qty 2

## 2022-03-14 MED ORDER — FOLIC ACID 5 MG/ML IJ SOLN
1.0000 mg | Freq: Every day | INTRAMUSCULAR | Status: DC
  Administered 2022-03-14 – 2022-03-16 (×3): 1 mg via INTRAVENOUS
  Filled 2022-03-14 (×4): qty 0.2

## 2022-03-14 NOTE — H&P (Addendum)
NAME:  Carolyn [name], MRN:  [id number], DOB:  06-Jan-1956, LOS: 0 ADMISSION DATE:  03/13/2022, CONSULTATION DATE: 03/14/2022 REFERRING MD: Dr. Karene Fry, CHIEF COMPLAINT: Dermatitis  History of Present Illness:   This is a 66 year old female, past medical history of chronic pancreatitis, history of prior alcoholism, gastroesophageal reflux, prior vitamin D deficiency documented.  She has known malabsorption issues and has been on Creon.  She presents with cellulitis and sepsis related to a ongoing rash of the hands and feet.  It appears that the rash has been going on for several weeks continued to get worse.  As started off with a small blister the blister then ruptured the skin started to dry out crack and flake.  She is starting to flake and desquamate portions of the hands and feet.  She developed increased pain and swelling in the right hand that developed redness that tracks into the forearm.  She presents to the ER with an elevated white count and was treated with antibiotics for concern of sepsis.  Pertinent  Medical History   Past Medical History:  Diagnosis Date   Alcoholic cirrhosis (HCC)    Chronic pancreatitis (HCC)    Endometriosis    GERD (gastroesophageal reflux disease)    Liver disease    Malabsorption    MALABSORPTION SYNDROME   Osteoporosis 03/2011   t score -2.5   Pancreatitis    due to cyst and tumors due to calcifications   Seizure (HCC)    no seizure disorder   Vitamin D deficiency 2012   VIT D 15     Significant Hospital Events: Including procedures, antibiotic start and stop dates in addition to other pertinent events     Interim History / Subjective:  Per HPI above  Objective   Blood pressure 125/71, pulse 85, temperature (!) 97.5 F (36.4 C), resp. rate 18, last menstrual period 03/10/2009, SpO2 100 %.       No intake or output data in the 24 hours ending 03/14/22 1701 There were no vitals filed for this visit.  Examination: General:  Chronically ill-appearing elderly thin female, There is no height or weight on file to calculate BMI.  HENT: NCAT, tracking appropriately Lungs: Clear to auscultation bilaterally no crackles no wheeze Cardiovascular: Regular rhythm S1-S2 Abdomen: Soft nontender nondistended Extremities: No significant edema Neuro: Alert oriented following commands GU: Deferred  Resolved Hospital Problem list     Assessment & Plan:   Sepsis related to cellulitis of the right hand and forearm Desquamating, flaking chronic rash of the hands and feet, skin break down  - I suspect that this is from nutritional deficiencies - I am thinking this is pellagra, Niacin, Vitamin B3 deficiency  - however many nutritional deficiencies, B6, trytophan, thiamine  - Most nutritional deficiencies co-exist with each other  - Patient is a prior alcoholic now with chronic pancreatitis taking Creon. - She has difficulty with vitamin absorption through her gut. - Although we could be dealing with something else this clinically makes the most sense.  Plan: Admit to the floor Continue ceftriaxone and vancomycin for cellulitis Several labs ordered to check serum vitamin levels to look for coexisting disease  Start empiric treatment with B complex vitamins as well as others. Empiric treatment I explained to the patient is likely harmless and hopefully her wounds will start to heal sooner I suspect it will take several days for the labs to result  Continue Creon Consult to registered dietitian Recheck labs in the morning Encourage p.o.  intake   Labs    CBC: Recent Labs  Lab 03/13/22 1520 03/14/22 0905  WBC 22.9* 18.1*  HGB 8.5* 7.4*  HCT 25.4* 22.5*  MCV 96.9 95.7  PLT 152 164    Basic Metabolic Panel: Recent Labs  Lab 03/13/22 1520 03/14/22 0905  NA 133* 138  K 5.4* 4.2  CL 104 107  CO2 24 25  GLUCOSE 141* 90  BUN 24* 25*  CREATININE 1.02* 0.80  CALCIUM 7.6* 7.5*   GFR: CrCl cannot be calculated  (Unknown ideal weight.). Recent Labs  Lab 03/13/22 1520 03/13/22 1913 03/14/22 0905  WBC 22.9*  --  18.1*  LATICACIDVEN 3.3* 1.6  --     Liver Function Tests: No results for input(s): "AST", "ALT", "ALKPHOS", "BILITOT", "PROT", "ALBUMIN" in the last 168 hours. No results for input(s): "LIPASE", "AMYLASE" in the last 168 hours. No results for input(s): "AMMONIA" in the last 168 hours.  ABG No results found for: "PHART", "PCO2ART", "PO2ART", "HCO3", "TCO2", "ACIDBASEDEF", "O2SAT"   Coagulation Profile: No results for input(s): "INR", "PROTIME" in the last 168 hours.  Cardiac Enzymes: No results for input(s): "CKTOTAL", "CKMB", "CKMBINDEX", "TROPONINI" in the last 168 hours.  HbA1C: Hgb A1c MFr Bld  Date/Time Value Ref Range Status  01/06/2022 08:25 PM 4.6 (L) 4.8 - 5.6 % Final    Comment:    (NOTE) Pre diabetes:          5.7%-6.4%  Diabetes:              >6.4%  Glycemic control for   <7.0% adults with diabetes     CBG: No results for input(s): "GLUCAP" in the last 168 hours.  Review of Systems:   Review of Systems  Constitutional:  Positive for malaise/fatigue. Negative for chills, fever and weight loss.  HENT:  Negative for hearing loss, sore throat and tinnitus.   Eyes:  Negative for blurred vision and double vision.  Respiratory:  Negative for cough, hemoptysis, sputum production, shortness of breath, wheezing and stridor.   Cardiovascular:  Negative for chest pain, palpitations, orthopnea, leg swelling and PND.  Gastrointestinal:  Negative for abdominal pain, constipation, diarrhea, heartburn, nausea and vomiting.  Genitourinary:  Negative for dysuria, hematuria and urgency.  Musculoskeletal:  Positive for myalgias. Negative for joint pain.  Skin:  Positive for rash. Negative for itching.  Neurological:  Positive for weakness. Negative for dizziness, tingling and headaches.  Endo/Heme/Allergies:  Negative for environmental allergies. Does not bruise/bleed  easily.  Psychiatric/Behavioral:  Negative for depression. The patient is not nervous/anxious and does not have insomnia.   All other systems reviewed and are negative.    Past Medical History:  She,  has a past medical history of Alcoholic cirrhosis (HCC), Chronic pancreatitis (HCC), Endometriosis, GERD (gastroesophageal reflux disease), Liver disease, Malabsorption, Osteoporosis (03/2011), Pancreatitis, Seizure (HCC), and Vitamin D deficiency (2012).   Surgical History:   Past Surgical History:  Procedure Laterality Date   APPENDECTOMY     BIOPSY  01/19/2022   Procedure: BIOPSY;  Surgeon: Shellia Cleverlyirigliano, Vito V, DO;  Location: [facility];  Service: Gastroenterology;;   CHOLECYSTECTOMY     ESOPHAGOGASTRODUODENOSCOPY (EGD) WITH PROPOFOL N/A 01/19/2022   Procedure: ESOPHAGOGASTRODUODENOSCOPY (EGD) WITH PROPOFOL;  Surgeon: Shellia Cleverlyirigliano, Vito V, DO;  Location: [facility];  Service: Gastroenterology;  Laterality: N/A;   FEEDING TUBE RELOCATION  2010   JEJUNOSTOMY FEEDING TUBE  1990   MULTIPLE GASTRIC SURGERIES     MYOMECTOMY     OPEN REDUCTION INTERNAL FIXATION (ORIF) DISTAL RADIAL  FRACTURE Left 06/29/2018   Procedure: OPEN REDUCTION INTERNAL FIXATION (ORIF)LEFT  DISTAL RADIAL FRACTURE;  Surgeon: Betha Loa, MD;  Location: [facility];  Service: Orthopedics;  Laterality: Left;   ROUX-EN-Y GASTRIC BYPASS     TUBAL LIGATION       Social History:   reports that she has never smoked. She has never used smokeless tobacco. She reports current alcohol use. She reports that she does not use drugs.   Family History:  Her family history includes Breast cancer in her maternal grandmother; Cancer in her father.   Allergies Allergies  Allergen Reactions   Peanut-Containing Drug Products Anaphylaxis    Tree nuts     Home Medications  Prior to Admission medications   Medication Sig Start Date End Date Taking? Authorizing Provider  buprenorphine (BUTRANS) 20 MCG/HR PTWK Place 1  patch onto the skin once a week.   Yes [provider]  clobetasol ointment (TEMOVATE) 0.05 % Apply 1 Application topically 2 (two) times daily. APPLY TO AFFECTED AREAS OF HANDS AND FEET   Yes [provider]  famotidine (PEPCID) 10 MG tablet Take 10 mg by mouth 2 (two) times daily.   Yes [provider]  feeding supplement (ENSURE ENLIVE / ENSURE PLUS) LIQD Take 237 mLs by mouth 3 (three) times daily between meals. 01/25/22  Yes Briant Cedar, MD  furosemide (LASIX) 40 MG tablet Take 1 tablet (40 mg total) by mouth daily. Patient taking differently: Take 40 mg by mouth daily as needed for fluid or edema. 02/18/22  Yes Tressia Danas, MD  lidocaine (XYLOCAINE) 2 % solution Use as directed 15 mLs in the mouth or throat every 6 (six) hours as needed for mouth pain. 09/05/20  Yes Cardama, Amadeo Garnet, MD  lipase/protease/amylase (CREON) 12000-38000 units CPEP capsule Take 2 capsules (24,000 Units total) by mouth 3 (three) times daily with meals. 02/11/22  Yes Esterwood, Amy S, PA-C  Multiple Vitamin (MULITIVITAMIN WITH MINERALS) TABS Take 1 tablet by mouth daily.   Yes [provider]  ondansetron (ZOFRAN) 4 MG tablet Take 4 mg by mouth every 8 (eight) hours as needed for nausea or vomiting.   Yes [provider]  pantoprazole (PROTONIX) 40 MG tablet Take 1 tablet (40 mg total) by mouth 2 (two) times daily before a meal. 02/11/22  Yes Esterwood, Amy S, PA-C  polyethylene glycol (MIRALAX / GLYCOLAX) 17 g packet Take 17 g by mouth 2 (two) times daily as needed for mild constipation. 01/25/22  Yes Briant Cedar, MD  potassium chloride SA (KLOR-CON M) 20 MEQ tablet Take 1 tablet (20 mEq total) by mouth 2 (two) times daily for 2 days. 02/18/22 03/13/22 Yes Tressia Danas, MD  potassium chloride SA (KLOR-CON M) 20 MEQ tablet Take 20 mEq by mouth 2 (two) times daily.   Yes [provider]  silver sulfADIAZINE (SILVADENE) 1 % cream Apply  topically 2 (two) times daily. Patient taking differently: Apply 1 Application topically 2 (two) times daily as needed (blister burns). 01/25/22  Yes Briant Cedar, MD  tiZANidine (ZANAFLEX) 2 MG tablet Take 1 tablet (2 mg total) by mouth every 12 (twelve) hours as needed for muscle spasms. 01/25/22  Yes Briant Cedar, MD  vitamin B-12 (CYANOCOBALAMIN) 100 MCG tablet Take 100 mcg by mouth daily.   Yes [provider]  zinc sulfate 220 (50 Zn) MG capsule Take 1 capsule (220 mg total) by mouth daily. 01/09/22  Yes Almon Hercules, MD  Hyoscyamine  Sulfate SL (LEVSIN/SL) 0.125 MG SUBL Place 1 each under the tongue every 6(six) hours as needed. Patient not taking: Reported on 03/14/2022 02/11/22   Esterwood, Amy S, PA-C  oxyCODONE-acetaminophen (PERCOCET/ROXICET) 5-325 MG tablet Take 1 tablet by mouth every 6 (six) hours as needed for severe pain. Patient not taking: Reported on 03/14/2022 02/28/22   Horton, Mayer Masker, MD  triamcinolone cream (KENALOG) 0.1 % Apply 1 Application topically 2 (two) times daily. Only to hands Patient not taking: Reported on 03/14/2022 02/28/22   Horton, Mayer Masker, MD  PROMETHAZINE HCL PO Take 4 mg by mouth.   06/06/11  [provider]  Ranitidine HCl (ZANTAC PO) Take by mouth.    06/06/11  [provider]     of time spent with patient and husband obtaining history and reviewing the chart for admission.   Josephine Igo, DO  Pulmonary Critical Care 03/14/2022 6:29 PM

## 2022-03-14 NOTE — Progress Notes (Addendum)
PROGRESS NOTE    Carolyn [name]  Carolyn [name] DOB: 09-30-1955 DOA: 03/13/2022 PCP: Pcp, No   Brief Narrative:  HPI per Dr. Darreld Mclean Carolyn [name] is a 66 y.o. female with past medical history significant for chronic alcoholic pancreatitis, chronic gastric ulcer with contained perforation, chronic pain, malnutrition who presented to the ED for evaluation of worsening skin peeling and skin changes to her feet and hands.   Patient reports issues with peeling skin and wounds which has been progressively worsening over the last 2 months.  She says she saw a dermatologist in [place] without formal diagnosis but had follow-up for possible culture or biopsy scheduled.  She was prescribed a topical ointment and given an injection without any improvement.   She says initially the wounds began as a bulla on the bottom of her feet.  These have desquamated and she is having continued skin loss on the palms and soles as well as the dorsum of the hands and feet.  She has multiple wounds in these areas with serous discharge from her feet.  She has some overlying erythema on the feet and hands as well.  She has chronic appearing ulcerated lesions/wounds at the tip of her second toes on both feet.  She has linear wounds at the flexural aspects of all of her fingers.   She reports chronic pain related to chronic pancreatitis but is having acute pain at the areas of her wounds on her feet and hands.  She denies any subjective fevers, chills, diaphoresis, chest pain, dyspnea.  **Interim History  Patient remained in the ED and was attempted to be transferred to a tertiary care center with dermatology.  The ED physician spoke with the CMO on-call and subsequently attempts to transfer the patient were yet made again.  Patient's desquamating rash is getting worse and despite being on Antibiotics the cellulitis continues to worsen.  I agreed with Dr. Allena Katz and also recommended the patient be transferred to a  tertiary care center however given lack of availability the ED physician was called the critical care team who is now accepted the patient for admission. TRH will sign off the case now.  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   -Patient states that this desquamation started happening 2 weeks ago and she started having the peeling of her skin and this now worsening.  She has seen outpatient dermatology and had a biopsy which is results are still pending and unavailable in our system and states that she was given injection of steroids as well as topical medications for the peeling skin and desquamation which did not help from now is getting worse -MRI of both feet performed without definitive evidence of osteomyelitis.  Would benefit from inpatient dermatology which is not available in our system.   -Unfortunately [facility], [facility], [facility], and [facility] are closed to transfers.   -We are asked to assist with comanagement while transfer can be arranged: Apparently no beds are available after further discussion with the ED physician and myself as well as our directors the ED physicians spoke with the on-call CMO.  ED physicians have reached out to critical care who has now accept the patient for admission.  Given that this has happened TRH has been Of the the case and will defer all management to the  critical care physicians.  At this time TRH recommendations are to still transfer this patient to have subspecialist care at a tertiary care facility -Agree with IV vancomycin and ceftriaxone, continue -Continue IV fluid hydration as ordered -Consult to wound care for any further assistance with skin care -PT OT to further evaluate and treat and they are  recommending SNF   Chronic alcoholic pancreatitis: -Stable.  Continue Creon with meals.   Hepatic cirrhosis: -Follows with [facility].  BP has been soft so we will hold spironolactone and Lasix.  No sign of significant abdominal ascites or encephalopathy.   Chronic gastric ulcer with contained perforation: -Appears stable.  Continue Protonix 40 mg PO BID. avoid blood thinners.   Anemia of chronic disease: -Hemoglobin dropping. Recent Labs  Lab 02/18/22 1344 02/23/22 1410 02/26/22 1628 02/28/22 0510 03/13/22 1520 03/14/22 0905 03/14/22 1625  HGB 9.2* 9.7* 8.9 Repeated and verified X2.* 8.4* 8.5* 7.4* 7.0*  HCT 27.0* 28.5* 27.4* 25.1* 25.4* 22.5* 21.2*  MCV 92.2 92.9 93.9 93.7 96.9 95.7 96.8  -Check Anemia Panel in the AM -Continue to Monitor for S/Sx of Bleeding; No overt bleeding noted -Repeat CBC in the AM   Malnutrition: -Dietitian to be consulted   DVT prophylaxis: heparin injection 5,000 Units Start: 03/14/22 2200 SCDs Start: 03/14/22 1801    Code Status: Full Code Family Communication: No family present at bedside   Disposition Plan:  Level of care: Stepdown Status is: Inpatient Remains inpatient appropriate because: The [place] recommendation was for her to be transferred to a tertiary care center however critical care is now admitting this patient   Consultants:  [place] -Now critical care has assumed primary  Procedures:  As delineated as above  Antimicrobials:  Anti-infectives (From admission, onward)    Start     Dose/Rate Route Frequency Ordered Stop   03/14/22 2200  vancomycin (VANCOREADY) IVPB 500 mg/100 mL        500 mg 100 mL/hr over 60 Minutes Intravenous Every 24 hours 03/13/22 2049     03/13/22 1915  vancomycin (VANCOCIN) IVPB 1000 mg/200 mL premix        1,000 mg 200 mL/hr over 60 Minutes Intravenous  Once 03/13/22 1908 03/14/22 0103   03/13/22 1615  cefTRIAXone (ROCEPHIN) 2 g in sodium chloride 0.9 % 100 mL IVPB        2 g 200 mL/hr over 30  Minutes Intravenous Every 24 hours 03/13/22 1603 03/20/22 1614       Subjective: Seen and examined at bedside and patient had significant pain in her hand and feet.  Thinks that the desquamation and skin peeling has gotten a lot worse.  States this acutely happened 2 weeks ago and states that she has seen a dermatologist in the outpatient setting who gave her some steroids as well as topical treatments without much improvement.  Patient states that this rash has never happened in the past and the skin peeling has just started 2 weeks ago.  Objective: Vitals:   03/14/22 1600 03/14/22 1610 03/14/22 1630 03/14/22 1730  BP: 111/63  125/71 133/80  Pulse: 85  85 99  Resp: 18  18 18   Temp:  (!) 97.5 F (36.4 C)    TempSrc:      SpO2: 100%  100% 100%    Intake/Output Summary (Last 24 hours) at 03/14/2022 1855 Last data filed at 03/14/2022 1817 Gross per 24 hour  Intake 100 ml  Output --  Net 100 ml   There were no vitals  filed for this visit.  Examination: Physical Exam:  Constitutional: Very thin and cachectic chronically ill-appearing Caucasian female Respiratory: Diminished to auscultation bilaterally, no wheezing, rales, rhonchi or crackles. Normal respiratory effort and patient is not tachypenic. No accessory muscle use.  Unlabored breathing Cardiovascular: RRR, no murmurs / rubs / gallops. S1 and S2 auscultated.  Abdomen: Soft, non-tender, non-distended. . Bowel sounds positive.  GU: Deferred. Musculoskeletal: No clubbing / cyanosis of digits/nails. No joint deformity upper and lower extremities. .  Skin: Her feet are erythematous and scaly skin is peeling and she has a desquamating dermatitis of the hands and feet and the hand is worsening.  She has a plantar surface of her feet with significant skin peeling and sloughing.  Erythema overlies her hands and feet and the erythema on the right hand is much worse today per the patient Neurologic: CN 2-12 grossly intact with no focal  deficits.  Romberg sign and cerebellar reflexes not assessed.  Psychiatric: Normal judgment and insight. Alert and oriented x 3. Normal mood and appropriate affect.   Data Reviewed: I have personally reviewed following labs and imaging studies  CBC: Recent Labs  Lab 03/13/22 1520 03/14/22 0905 03/14/22 1625  WBC 22.9* 18.1* 16.9*  NEUTROABS  --   --  14.6*  HGB 8.5* 7.4* 7.0*  HCT 25.4* 22.5* 21.2*  MCV 96.9 95.7 96.8  PLT 152 164 [id number]   Basic Metabolic Panel: Recent Labs  Lab 03/13/22 1520 03/14/22 0905  NA 133* 138  K 5.4* 4.2  CL 104 107  CO2 24 25  GLUCOSE 141* 90  BUN 24* 25*  CREATININE 1.02* 0.80  CALCIUM 7.6* 7.5*   GFR: CrCl cannot be calculated (Unknown ideal weight.). Liver Function Tests: No results for input(s): "AST", "ALT", "ALKPHOS", "BILITOT", "PROT", "ALBUMIN" in the last 168 hours. No results for input(s): "LIPASE", "AMYLASE" in the last 168 hours. No results for input(s): "AMMONIA" in the last 168 hours. Coagulation Profile: No results for input(s): "INR", "PROTIME" in the last 168 hours. Cardiac Enzymes: No results for input(s): "CKTOTAL", "CKMB", "CKMBINDEX", "TROPONINI" in the last 168 hours. BNP (last 3 results) No results for input(s): "PROBNP" in the last 8760 hours. HbA1C: No results for input(s): "HGBA1C" in the last 72 hours. CBG: No results for input(s): "GLUCAP" in the last 168 hours. Lipid Profile: No results for input(s): "CHOL", "HDL", "LDLCALC", "TRIG", "CHOLHDL", "LDLDIRECT" in the last 72 hours. Thyroid Function Tests: No results for input(s): "TSH", "T4TOTAL", "FREET4", "T3FREE", "THYROIDAB" in the last 72 hours. Anemia Panel: No results for input(s): "VITAMINB12", "FOLATE", "FERRITIN", "TIBC", "IRON", "RETICCTPCT" in the last 72 hours. Sepsis Labs: Recent Labs  Lab 03/13/22 1520 03/13/22 1913  LATICACIDVEN 3.3* 1.6    Recent Results (from the past 240 hour(s))  Blood culture (routine x 2)     Status: None  (Preliminary result)   Collection Time: 03/13/22  3:20 PM   Specimen: BLOOD  Result Value Ref Range Status   Specimen Description   Final    BLOOD BLOOD LEFT ARM Performed at [facility], [place] [address], [place], [place] [address]    Special Requests   Final    BOTTLES DRAWN AEROBIC AND ANAEROBIC Blood Culture adequate volume Performed at [facility] [address], [place], [place] [address]    Culture   Final    NO GROWTH < 12 HOURS Performed at [facility] [address] [place], [place] [address]    Report Status PENDING  Incomplete  Blood culture (routine x 2)     Status: None (Preliminary result)   Collection Time: 03/13/22  3:20 PM   Specimen: BLOOD  Result Value Ref Range Status   Specimen Description   Final    BLOOD BLOOD RIGHT ARM Performed at [facility] [address], [place], [place] [address]    Special Requests   Final    BOTTLES DRAWN AEROBIC AND ANAEROBIC Blood Culture results may not be optimal due to an excessive volume of blood received in culture bottles Performed at [facility] [address], [place], [place] [address]    Culture   Final    NO GROWTH < 12 HOURS Performed at [facility] [address], [place], [place] [address]    Report Status PENDING  Incomplete    Radiology Studies: MR FOOT RIGHT WO CONTRAST  Result Date: 03/13/2022 CLINICAL DATA:  Diabetes. Osteomyelitis suspected. Foot swelling. Soft tissue infection suspected. EXAM: MRI OF THE RIGHT FOREFOOT WITHOUT CONTRAST TECHNIQUE: Multiplanar, multisequence MR imaging of the right foot radiographs 03/13/2022 was performed. No intravenous contrast was administered. COMPARISON:  Right foot radiographs 03/13/2022, 02/28/2022, 01/06/2022; MRI right forefoot FINDINGS: Bones/Joint/Cartilage There is again decreased T1 increased T2 signal marrow edema within the distal aspect of the distal phalanx of  the great toe. Again no definitive soft tissue ulceration is seen, and recommend clinical correlation for signs of adjacent soft tissue infection. Inhomogeneous fat saturation limits evaluation of the phalanges. There is again possible decreased T1 and increased T2/STIR signal within the middle and distal phalanges of the fifth toe, however this may be artifactual. Mild apparent marrow edema within the distal phalanx of the second toe may be slightly increased from prior (sagittal STIR series 9, image 16). Question thinning of the overlying distal second toe skin. Moderate to severe great toe interphalangeal joint space narrowing and peripheral osteophytosis with unchanged valgus angulation. Ligaments The Lisfranc ligament complex is intact. The collateral ligaments appear intact. Muscles and Tendons Redemonstration of diffuse plantar and dorsal muscle edema, not significantly changed. Soft tissues No definite soft tissue ulceration is seen. There is again mild forefoot plantar and dorsal subcutaneous fat edema and swelling, greatest within the mid to distal aspect of the metatarsals and similar to 01/07/2022 prior MRI. IMPRESSION: Compared to 01/07/2022: 1. Marrow edema within the distal aspect of the distal phalanx of the great toe appears unchanged. Again no definitive soft tissue ulceration is seen, and recommend clinical correlation for signs of adjacent soft tissue infection that would raise concern for this edema representing osteomyelitis versus stress related change. 2. Apparent mildly increased marrow edema within the distal phalanx of the second toe compared to 01/07/2022. Question thinning of the overlying distal second toe skin. Recommend clinical correlation for concern for soft tissue ulcer/infection in this region which would raise concern for distal phalanx osteomyelitis. 3. There is again possible decreased T1 and increased T2 signal within the middle and distal phalanges of the fifth toe, however  again this may be artifactual. Electronically Signed   By: Yvonne Kendall M.D.   On: 03/13/2022 19:52   MR FOOT LEFT WO CONTRAST  Result Date: 03/13/2022 CLINICAL DATA:  Diabetes. Foot swelling. Osteomyelitis suspected. Soft tissue infection suspected. EXAM: MRI OF THE LEFT FOOT WITHOUT CONTRAST TECHNIQUE: Multiplanar, multisequence MR imaging of the left forefoot was performed. No intravenous contrast was administered. COMPARISON:  Left foot radiographs 03/13/2022, 02/28/2022, 01/07/2022, 03/02/2021 FINDINGS: Bones/Joint/Cartilage Note is made that an  acute, intra-articular, mildly displaced fracture was seen on prior 03/02/2021 radiographs within the medial base of the proximal phalanx of the great toe. This appears to have at least partially healed with unchanged mild displacement on numerous prior radiographs including radiographs today, 03/13/2022. Nevertheless, there is high-grade marrow edema seen throughout the proximal 80% of the great toe, and there appear to be multiple acute to subacute fracture lines. There is additional marrow edema and apparent cortical step-off/possible acute to subacute fracture within the distal 50% of the great toe distal phalanx (sagittal series 8, image 11 and coronal series 7, image 10). Recommend clinical correlation for whether there is any concern for infection within the distal great toe that would be suspicious for great toe distal phalanx osteomyelitis. There is also marrow edema throughout the second toe distal phalanx (sagittal series 8, image 17) and the distal tip of the distal phalanx of the third toe (sagittal series 8, image 21). Recommend additional clinical correlation for any concern for skin ulceration and infection in these regions. No definitive soft tissue ulcer is seen on MRI. There is moderate marrow edema within the distal aspect of the cuboid and mild patchy marrow edema throughout the proximal, mid, and distal aspect of the fifth metatarsal. This  may be secondary to degenerative changes and/or stress related changes. Severe tarsometatarsal cartilage degenerative changes with full-thickness cartilage loss diffusely, greatest within the fourth and fifth tarsometatarsal joints at the articulation with the cuboid. Ligaments The Lisfranc ligament complex is intact. The plantar plates appear intact. Muscles and Tendons Minimal flexor tenosynovitis diffusely at the level of the metatarsal shafts. Mild edema is seen throughout the plantar and dorsal musculature, nonspecific myositis. Soft tissues There is mild edema and soft tissue swelling greatest within the dorsal medial aspect of the forefoot, at the level of the great toe metatarsal through phalanges, and within the plantar midfoot. No definite soft tissue ulcer is seen. No abscess or other fluid collection is seen. IMPRESSION: 1. There was an acute fracture within the medial base of the proximal phalanx of the great toe on 03/02/2021 radiographs that appears to have healed over time on subsequent radiographs, however on the current MRI there is high-grade marrow edema throughout the great toe proximal phalanx with apparent subacute fracture lines. Question incomplete healing versus repeat trauma. 2. Additional marrow edema and apparent cortical step-off/possible acute to subacute fracture within the distal 50% of the great toe distal phalanx. Recommend clinical correlation for whether there is any concern for infection within the distal great toe that would be suspicious for great toe distal phalanx osteomyelitis. 3. There is also marrow edema throughout the second toe distal phalanx and the distal tip of the distal phalanx of the third toe. Recommend clinical correlation for whether there are any toe soft tissue ulcerations that would raise concern for acute osteomyelitis. Electronically Signed   By: Neita Garnet M.D.   On: 03/13/2022 19:24   DG Foot Complete Right  Result Date: 03/13/2022 CLINICAL  DATA:  Skin peeling over both feet. EXAM: RIGHT FOOT COMPLETE - 3 VIEW COMPARISON:  Right foot radiographs 02/28/2022 FINDINGS: No acute osseous abnormality is present. Chronic flexion deformity present at the great toe. Progressive soft tissue swelling is present over the dorsum of the foot near the MTP joints. No associated osseous abnormality is present. No gas is present in the soft tissues. IMPRESSION: 1. Progressive soft tissue swelling over the dorsum of the foot near the MTP joints. Findings are nonspecific. This may represent  edema or cellulitis. 2. No acute osseous abnormality. Electronically Signed   By: San Morelle M.D.   On: 03/13/2022 10:46   DG Foot Complete Left  Result Date: 03/13/2022 CLINICAL DATA:  Peeling skin over the feet bilaterally. EXAM: LEFT FOOT - COMPLETE 3+ VIEW COMPARISON:  Left foot radiographs 02/28/2022. FINDINGS: Soft tissue swelling is now noted over the dorsum of the foot. Bandage is in place. No acute or focal osseous abnormalities are present. Remote fracture at the base of the proximal phalanx in the great toe is stable. Mild osteopenia is again noted. IMPRESSION: 1. New soft tissue swelling over the dorsum of the foot consistent with edema or cellulitis. 2. No acute osseous abnormalities or significant change. Electronically Signed   By: San Morelle M.D.   On: 03/13/2022 10:43    Scheduled Meds:  cholecalciferol  1,000 Units Oral Daily   famotidine  10 mg Oral BID   feeding supplement  237 mL Oral TID BM   folic acid  1 mg Intravenous Daily   heparin  5,000 Units Subcutaneous Q8H   lipase/protease/amylase  24,000 Units Oral TID WC   multivitamin with minerals  1 tablet Oral Daily   mupirocin cream   Topical Once   niacin  500 mg Oral BID WC   pantoprazole  40 mg Oral BID AC   silver sulfADIAZINE   Topical Daily   vitamin B-12  100 mcg Oral Daily   zinc sulfate  220 mg Oral Daily   Continuous Infusions:  cefTRIAXone (ROCEPHIN)  IV  Stopped (03/14/22 1817)   thiamine (VITAMIN B1) injection     vancomycin      LOS: 0 days   Raiford Noble, DO [facility] Available via Epic secure chat 7am-7pm After these hours, please refer to coverage provider listed on [email or web address] 03/14/2022, 6:55 PM

## 2022-03-14 NOTE — Consult Note (Signed)
WOC Nurse Consult Note: Reason for Consult:Sepsis and cellulitis in the setting of desquamation of hands, feet.   Wound type: infectious, inflammatory Pressure Injury POA: NA Measurement: generalized epithelial peeling to hands, soles of feet with surrounding erythema, edema and pain Wound bed: pink, some areas have scabbed Drainage (amount, consistency, odor) scant serosanguinous  Periwound:erythema and edema Dressing procedure/placement/frequency: Cleanse hands and feet with soap and water.  Apply silvadene cream to palms and soles of feet. Cover with dry dressing and wrap with kerlix.  Change daily. Will not follow at this time.  Please re-consult if needed.  Mike Gip MSN, RN, FNP-BC CWON Wound, Ostomy, Continence Nurse Outpatient [facility] [phone] Pager [phone]

## 2022-03-14 NOTE — ED Provider Notes (Signed)
7:50 AM Care assumed from overnight team.  At time of transfer of care, patient is awaiting finalization of disposition as patient needs to be admitted for sepsis from cellulitis due to skin breakdown from desquamating wounds and there has been a challenge getting patient an appropriate admission.  Previous team reportedly spoke to [facility], [facility], [facility], and [facility] in [place] all who are at capacity and cannot take the patient.  The admitting hospitalist team here initially felt that patient needed admission at a hospital where Derm is available.  Patient has now been in the emergency department for over 22 hours.  Patient has had home medications ordered, is getting antibiotics, and was reportedly stable per overnight team but will still need admission.  Plan to escalate admission discussion to CMO on-call this morning.  9:47 AM Spoke to Dr. Lindie Spruce as the CMO on-call.  He is going to review the case and help give some recommendations for disposition for patient.  Will continue to trend hemoglobin as it appears to be downtrending although she is denying any dark stools or vomiting blood at this time.  She does report she is scheduled to have an endoscopy and colonoscopy in several weeks due to history of ulcers and GI bleeding.  She reports she has had to get blood transfusion in the past during previous admissions.  10:25 AM To spoke to [facility] who reports that Dr. Marland Mcalpine will be able to come by and see patient and help weigh in on disposition.  He requested we have physical therapy come see to eval to treat to help with further disposition planning.   Jnae Thomaston, Canary Brim, MD 03/14/22 [phone]

## 2022-03-14 NOTE — ED Notes (Signed)
Dressing applied with guaze, kling & tape to bilateral feet

## 2022-03-14 NOTE — Evaluation (Signed)
Physical Therapy Evaluation Patient Details Name: Carolyn [name] MRN: [id number] DOB: [birth year] Today's Date: 03/14/2022  History of Present Illness  66 year old female with history of chronic pancreatitis, chronic pain syndrome, GERD, severe malnutrition, osteoporosis who presented to [facility] ED with LE "cellulitis due to breakdown from desquammating wounds" involving bil feet and to lesser extent hands  Clinical Impression  Pt admitted as above and presenting with functional mobility limitations 2* generalized weakness, deconditioning, and inability to mobilize or WB 2* bil foot pain.  Pt would benefit from follow up rehab at SNF level to maximize IND and safety prior to return home with limited assist.     Recommendations for follow up therapy are one component of a multi-disciplinary discharge planning process, led by the attending physician.  Recommendations may be updated based on patient status, additional functional criteria and insurance authorization.  Follow Up Recommendations Skilled nursing-short term rehab (<3 hours/day) Can patient physically be transported by private vehicle: No    Assistance Recommended at Discharge Frequent or constant Supervision/Assistance  Patient can return home with the following  Two people to help with walking and/or transfers;A lot of help with bathing/dressing/bathroom;Assistance with cooking/housework;Assist for transportation;Help with stairs or ramp for entrance    Equipment Recommendations None recommended by PT  Recommendations for Other Services       Functional Status Assessment Patient has had a recent decline in their functional status and demonstrates the ability to make significant improvements in function in a reasonable and predictable amount of time.     Precautions / Restrictions Precautions Precautions: Fall;Other (comment) Restrictions Weight Bearing Restrictions: No Other Position/Activity Restrictions: Pt unable to attempt WB  2* pain level      Mobility  Bed Mobility Overal bed mobility: Needs Assistance Bed Mobility: Supine to Sit, Sit to Supine     Supine to sit: Min assist Sit to supine: Mod assist   General bed mobility comments: Increased time with min assist to bring LEs over EOB and trunk to upright.  Increased assist to bring LEs back onto bed.    Transfers                   General transfer comment: unable to attempt 2* pain    Ambulation/Gait                  Stairs            Wheelchair Mobility    Modified Rankin (Stroke Patients Only)       Balance Overall balance assessment: Needs assistance Sitting-balance support: No upper extremity supported, Feet unsupported Sitting balance-Leahy Scale: Fair                                       Pertinent Vitals/Pain Pain Assessment Pain Assessment: 0-10 Pain Score: 10-Worst pain ever Pain Location: bilat feet with attempts to mobilize Pain Descriptors / Indicators: Grimacing, Guarding, Moaning Pain Intervention(s): Limited activity within patient's tolerance, Monitored during session    Home Living Family/patient expects to be discharged to:: Unsure                        Prior Function Prior Level of Function : History of Falls (last six months);Needs assist             Mobility Comments: history of falls in recent past and now largely bedbound 2* current  painful issue with bil feet ADLs Comments: until 3 month ago, was completely independent and taking care of her farm animals and dogs     Hand Dominance   Dominant Hand: Right    Extremity/Trunk Assessment   Upper Extremity Assessment Upper Extremity Assessment: Generalized weakness;RUE deficits/detail;LUE deficits/detail RUE Deficits / Details: significant weakness bil UEs most notable hands with pt unable to make complete fist bilaterally    Lower Extremity Assessment Lower Extremity Assessment: LLE  deficits/detail;Generalized weakness;RLE deficits/detail RLE Deficits / Details: significant bil weakness most notably feet and ankles but with all movement pain limited RLE: Unable to fully assess due to pain LLE: Unable to fully assess due to pain       Communication   Communication: No difficulties  Cognition Arousal/Alertness: Awake/alert Behavior During Therapy: WFL for tasks assessed/performed Overall Cognitive Status: Within Functional Limits for tasks assessed                                          General Comments      Exercises     Assessment/Plan    PT Assessment Patient needs continued PT services  PT Problem List Decreased strength;Decreased range of motion;Decreased activity tolerance;Decreased balance;Decreased mobility;Decreased knowledge of use of DME;Pain       PT Treatment Interventions DME instruction;Gait training;Functional mobility training;Therapeutic activities;Therapeutic exercise;Balance training;Patient/family education    PT Goals (Current goals can be found in the Care Plan section)  Acute Rehab PT Goals Patient Stated Goal: Less pain, regain IND PT Goal Formulation: With patient Time For Goal Achievement: 03/28/22 Potential to Achieve Goals: Fair    Frequency Min 2X/week     Co-evaluation               AM-PAC PT "6 Clicks" Mobility  Outcome Measure Help needed turning from your back to your side while in a flat bed without using bedrails?: A Little Help needed moving from lying on your back to sitting on the side of a flat bed without using bedrails?: A Little Help needed moving to and from a bed to a chair (including a wheelchair)?: Total Help needed standing up from a chair using your arms (e.g., wheelchair or bedside chair)?: Total Help needed to walk in hospital room?: Total Help needed climbing 3-5 steps with a railing? : Total 6 Click Score: 10    End of Session Equipment Utilized During Treatment:  Gait belt Activity Tolerance: Patient limited by pain Patient left: in bed;with call bell/phone within reach Nurse Communication: Mobility status PT Visit Diagnosis: Muscle weakness (generalized) (M62.81);History of falling (Z91.81);Difficulty in walking, not elsewhere classified (R26.2);Pain Pain - part of body: Hand;Ankle and joints of foot;Leg    Time: 1353-1410 PT Time Calculation (min) (ACUTE ONLY): 17 min   Charges:   PT Evaluation $PT Eval Low Complexity: 1 Low          [place] Pager [phone] Office [phone]   Dawid Dupriest 03/14/2022, 6:05 PM

## 2022-03-14 NOTE — ED Provider Notes (Signed)
  Physical Exam  BP 128/88   Pulse 96   Temp (!) 97.5 F (36.4 C) (Oral)   Resp 18   LMP 03/10/2009   SpO2 100%     Procedures  Procedures  ED Course / MDM    Medical Decision Making Amount and/or Complexity of Data Reviewed Labs: ordered. Radiology: ordered.  Risk Prescription drug management. Decision regarding hospitalization.    Patient case was discussed with the CMO on-call, Dr. Lindie Spruce per previous documentation. Hospitalist medicine will be re-evaluating the patient this afternoon for consideration for admission.    1620 Tried again with the [facility], they are currently at capacity and are not even offering a waitlist at this time. Hospitalist medicine will not admit the patient due to her underlying apparent dermatologic condition, recommending transfer to a tertiary care center.   1745 Pt evaluated by Dr. Tonia Brooms of pulmonary critical care, concern for possible vitamin deficiency such as [place] resulting in her desquamating dermatitis. The patient will be admitted.    Ernie Avena, MD 03/14/22 1745

## 2022-03-15 ENCOUNTER — Other Ambulatory Visit: Payer: Self-pay

## 2022-03-15 ENCOUNTER — Inpatient Hospital Stay (HOSPITAL_COMMUNITY): Payer: Medicare PPO

## 2022-03-15 DIAGNOSIS — R6520 Severe sepsis without septic shock: Secondary | ICD-10-CM | POA: Diagnosis not present

## 2022-03-15 DIAGNOSIS — A419 Sepsis, unspecified organism: Secondary | ICD-10-CM | POA: Diagnosis not present

## 2022-03-15 LAB — BASIC METABOLIC PANEL
Anion gap: 6 (ref 5–15)
BUN: 22 mg/dL (ref 8–23)
CO2: 24 mmol/L (ref 22–32)
Calcium: 7.2 mg/dL — ABNORMAL LOW (ref 8.9–10.3)
Chloride: 102 mmol/L (ref 98–111)
Creatinine, Ser: 1 mg/dL (ref 0.44–1.00)
GFR, Estimated: >60 mL/min (ref >60)
Glucose, Bld: 134 mg/dL — ABNORMAL HIGH (ref 70–99)
Potassium: 4.2 mmol/L (ref 3.5–5.1)
Sodium: 132 mmol/L — ABNORMAL LOW (ref 135–145)

## 2022-03-15 LAB — CBC
HCT: 19.2 % — ABNORMAL LOW (ref 36.0–46.0)
Hemoglobin: 6.5 g/dL — CL (ref 12.0–15.0)
MCH: 32.5 pg (ref 26.0–34.0)
MCHC: 33.9 g/dL (ref 30.0–36.0)
MCV: 96 fL (ref 80.0–100.0)
Platelets: 175 10*3/uL (ref 150–400)
RBC: 2 MIL/uL — ABNORMAL LOW (ref 3.87–5.11)
RDW: 14.6 % (ref 11.5–15.5)
WBC: 13.2 10*3/uL — ABNORMAL HIGH (ref 4.0–10.5)
nRBC: 0 % (ref 0.0–0.2)

## 2022-03-15 LAB — SEDIMENTATION RATE: Sed Rate: 25 mm/hr — ABNORMAL HIGH (ref 0–22)

## 2022-03-15 LAB — TSH: TSH: 33.958 u[IU]/mL — ABNORMAL HIGH (ref 0.350–4.500)

## 2022-03-15 LAB — PHOSPHORUS: Phosphorus: 2.6 mg/dL (ref 2.5–4.6)

## 2022-03-15 LAB — VITAMIN D 25 HYDROXY (VIT D DEFICIENCY, FRACTURES): Vit D, 25-Hydroxy: 44.92 ng/mL (ref 30–100)

## 2022-03-15 LAB — MAGNESIUM: Magnesium: 1.8 mg/dL (ref 1.7–2.4)

## 2022-03-15 LAB — PREPARE RBC (CROSSMATCH)

## 2022-03-15 MED ORDER — INFUVITE ADULT IV SOLN
Freq: Once | INTRAVENOUS | Status: AC
  Filled 2022-03-15: qty 5

## 2022-03-15 MED ORDER — SODIUM CHLORIDE 0.9% IV SOLUTION: Freq: Once | INTRAVENOUS | Status: DC

## 2022-03-15 MED ORDER — LEVOTHYROXINE SODIUM 100 MCG/5ML IV SOLN
75.0000 ug | Freq: Every day | INTRAVENOUS | Status: DC
  Administered 2022-03-16 – 2022-03-17 (×2): 75 ug via INTRAVENOUS
  Filled 2022-03-15 (×3): qty 5

## 2022-03-15 MED ORDER — OXYCODONE HCL 5 MG PO TABS
5.0000 mg | ORAL_TABLET | Freq: Four times a day (QID) | ORAL | Status: DC | PRN
  Administered 2022-03-15 – 2022-03-16 (×2): 5 mg via ORAL
  Administered 2022-03-17 – 2022-03-20 (×10): 10 mg via ORAL
  Administered 2022-03-21: 5 mg via ORAL
  Administered 2022-03-21 (×2): 10 mg via ORAL
  Filled 2022-03-15 (×3): qty 2
  Filled 2022-03-15: qty 1
  Filled 2022-03-15 (×5): qty 2
  Filled 2022-03-15: qty 1
  Filled 2022-03-15 (×6): qty 2

## 2022-03-15 MED ORDER — THIAMINE HCL 100 MG/ML IJ SOLN
500.0000 mg | INTRAVENOUS | Status: AC
  Administered 2022-03-15 – 2022-03-16 (×2): 500 mg via INTRAVENOUS
  Filled 2022-03-15 (×2): qty 5

## 2022-03-15 MED ORDER — ZINC SULFATE 220 (50 ZN) MG PO CAPS
220.0000 mg | ORAL_CAPSULE | Freq: Every day | ORAL | Status: DC
  Administered 2022-03-15 – 2022-03-31 (×17): 220 mg via ORAL
  Filled 2022-03-15 (×17): qty 1

## 2022-03-15 MED ORDER — FENTANYL CITRATE PF 50 MCG/ML IJ SOSY
50.0000 ug | PREFILLED_SYRINGE | INTRAMUSCULAR | Status: AC | PRN
  Administered 2022-03-15 – 2022-03-17 (×6): 50 ug via INTRAVENOUS
  Filled 2022-03-15 (×6): qty 1

## 2022-03-15 NOTE — ED Notes (Addendum)
Pt unable to sign blood consent due to wounds on her hands. I discussed with pt the risks and benefits of blood administration. Pt verbally agreed to consent to blood administration, witnessed by second RN Shatia B.

## 2022-03-15 NOTE — Consult Note (Signed)
[facility] for Infectious Disease    Date of Admission:  03/13/2022   Total days of inpatient antibiotics 2        Reason for Consult:  Cellulitis    Principal Problem:   Severe sepsis ([place]) Active Problems:   Protein-calorie malnutrition, severe ([place])   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HCC)   Chronic gastric ulcer with perforation ([place])   Desquamative dermatitis   Sepsis ([place])   Assessment: 67 YF admitted with:  #Desquamating rash on b/l  hand and feet 2/2 likely nutritional def vs underlying dermatologic condition #C/F malabsorption, possible niacin def #B/L foot OM #Leukocytosis-trending down -Patient has a history of vitamin D deficiency. Also history of pancreatitis, mild section on Creon. Nutritional def is possible underlying etiology w/u pending.  - Rash has been ongoing on hands and feet for several weeks but has gotten worse.  Pt notes on 10/25 she went to [facility] and noticed blister on her foot that burst, she then had more blistering. Hospitalized 10/25-11/13 for toe OM and resp failure. ABLA, She was seen by Payton Mccallum in [place] on 12/28 and had Bx.  - MRI of bilateral feet is concerning for osteomyelitis.  MRI left foot showed possible great toe distal phalanx osteomyelitis, second toe distal phalanx and third toe osteomyelitis.  MRI right foot showed great toe osteomyelitis, concern for second toe osteomyelitis. Pt has been seen by Podiatry(MRI had shown OM in right foot, xray negative for OM on review of latest imaging at 12/19 podiatry appt).    Recommendations:  - Continue vancomycin and ceftriaxone while inpatient. Transition to doxycycline and Augmentin to complete 10 days of antibiotics for possible superimposed cellulitis as right hand redness improved on antibiotics. - Vitamin def w/u pending - F/U with Dermatology(Bx) Jule Ser, Meredith Dunzweiler) -  Ordered hepatitis panel and RPR  - ESR, CRP, ANA, rheumatoid factor - Podiatry F/U for  bone biopsy to confirm osteomyelitis in the next week.    ID will sign and F/U in clinic with myself on 03/22/22  I have personally spent 95 minutes involved in face-to-face and non-face-to-face activities for this patient on the day of the visit. Professional time spent includes the following activities: Preparing to see the patient (review of tests), Obtaining and/or reviewing separately obtained history (admission/discharge record), Performing a medically appropriate examination and/or evaluation , Ordering medications/tests/procedures, referring and communicating with other health care professionals, Documenting clinical information in the EMR, Independently interpreting results (not separately reported), Communicating results to the patient/family/caregiver, Counseling and educating the patient/family/caregiver and Care coordination (not separately reported).   Microbiology:   Antibiotics: Vancomycin and ceftriaxone 12/30-  Cultures: Blood 12/30 NG   HPI: Carolyn [name] is a 67 y.o. female prior history of alcoholism, chronic headaches, GERD documented vitamin D deficiency in the past, malabsorption on Creon admitted for rash on bilateral hands and feet.  Patient has had rash for several weeks and has continued to get worse.  Started off with a blister then ruptured the skin.  Rash is desquamating on palms and soles.   Review of Systems: Review of Systems  All other systems reviewed and are negative.   Past Medical History:  Diagnosis Date   Alcoholic cirrhosis ([place])    Chronic pancreatitis ([place])    Endometriosis    GERD (gastroesophageal reflux disease)    Liver disease    Malabsorption    MALABSORPTION SYNDROME   Osteoporosis 03/2011   t score -2.5   Pancreatitis  due to cyst and tumors due to calcifications   Seizure (HCC)    no seizure disorder   Vitamin D deficiency 2012   VIT D 15    Social History   Tobacco Use   Smoking status: Never   Smokeless tobacco:  Never  Substance Use Topics   Alcohol use: Yes    Comment: occ ( none in several weeks)   Drug use: No    Family History  Problem Relation Age of Onset   Cancer Father        BONE   Breast cancer Maternal Grandmother    Scheduled Meds:  sodium chloride   Intravenous Once   cholecalciferol  1,000 Units Oral Daily   famotidine  10 mg Oral BID   feeding supplement  237 mL Oral TID BM   folic acid  1 mg Intravenous Daily   heparin  5,000 Units Subcutaneous Q8H   lipase/protease/amylase  24,000 Units Oral TID WC   mupirocin cream   Topical Once   niacin  500 mg Oral BID WC   pantoprazole  40 mg Oral BID AC   silver sulfADIAZINE   Topical Daily   vitamin B-12  100 mcg Oral Daily   zinc sulfate  220 mg Oral Daily   Continuous Infusions:  cefTRIAXone (ROCEPHIN)  IV Stopped (03/14/22 1817)   M.V.I. Adult (INFUVITE ADULT) 10 mL in dextrose 5% lactated ringers 1,000 mL infusion     thiamine (VITAMIN B1) injection     vancomycin Stopped (03/15/22 0023)   PRN Meds:.acetaminophen, docusate sodium, ondansetron, oxyCODONE, polyethylene glycol, tiZANidine Allergies  Allergen Reactions   Peanut-Containing Drug Products Anaphylaxis    Tree nuts    OBJECTIVE: Blood pressure 126/75, pulse 100, temperature 97.7 F (36.5 C), temperature source Oral, resp. rate 15, last menstrual period 03/10/2009, SpO2 98 %.  Physical Exam Constitutional:      Appearance: Normal appearance.  HENT:     Head: Normocephalic and atraumatic.     Right Ear: Tympanic membrane normal.     Left Ear: Tympanic membrane normal.     Nose: Nose normal.     Mouth/Throat:     Mouth: Mucous membranes are moist.  Eyes:     Extraocular Movements: Extraocular movements intact.     Conjunctiva/sclera: Conjunctivae normal.     Pupils: Pupils are equal, round, and reactive to light.  Cardiovascular:     Rate and Rhythm: Normal rate and regular rhythm.     Heart sounds: No murmur heard.    No friction rub. No gallop.   Pulmonary:     Effort: Pulmonary effort is normal.     Breath sounds: Normal breath sounds.  Abdominal:     General: Abdomen is flat.     Palpations: Abdomen is soft.  Skin:    Comments: Desquamating rash on hands and feet  Neurological:     General: No focal deficit present.     Mental Status: She is alert and oriented to person, place, and time.  Psychiatric:        Mood and Affect: Mood normal.     Lab Results Lab Results  Component Value Date   WBC 13.2 (H) 03/15/2022   HGB 6.5 (LL) 03/15/2022   HCT 19.2 (L) 03/15/2022   MCV 96.0 03/15/2022   PLT 175 03/15/2022    Lab Results  Component Value Date   CREATININE 1.00 03/15/2022   BUN 22 03/15/2022   NA 132 (L) 03/15/2022   K 4.2 03/15/2022  CL 102 03/15/2022   CO2 24 03/15/2022    Lab Results  Component Value Date   ALT 14 01/22/2022   AST 36 01/22/2022   ALKPHOS 139 (H) 01/22/2022   BILITOT 0.6 01/22/2022       Laurice Record, [facility] for Infectious Disease [facility] 03/15/2022, 11:31 AM

## 2022-03-15 NOTE — Progress Notes (Addendum)
NAME:  Carolyn [name], MRN:  [id number], DOB:  11/17/1955, LOS: 1 ADMISSION DATE:  03/13/2022, CONSULTATION DATE: 03/14/2022 REFERRING MD: Dr. Armandina Gemma, CHIEF COMPLAINT: Dermatitis  History of Present Illness:   This is a 67 year old female, past medical history of chronic pancreatitis, history of prior alcoholism, gastroesophageal reflux, prior vitamin D deficiency documented.  She has known malabsorption issues and has been on Creon.  She presents with cellulitis and sepsis related to a ongoing rash of the hands and feet.  It appears that the rash has been going on for several weeks continued to get worse.  As started off with a small blister the blister then ruptured the skin started to dry out crack and flake.  She is starting to flake and desquamate portions of the hands and feet.  She developed increased pain and swelling in the right hand that developed redness that tracks into the forearm.  She presents to the ER with an elevated white count and was treated with antibiotics for concern of sepsis.  Pertinent  Medical History   Past Medical History:  Diagnosis Date   Alcoholic cirrhosis ([place])    Chronic pancreatitis ([place])    Endometriosis    GERD (gastroesophageal reflux disease)    Liver disease    Malabsorption    MALABSORPTION SYNDROME   Osteoporosis 03/2011   t score -2.5   Pancreatitis    due to cyst and tumors due to calcifications   Seizure ([place])    no seizure disorder   Vitamin D deficiency 2012   VIT D 15     Significant Hospital Events: Including procedures, antibiotic start and stop dates in addition to other pertinent events   1/1 sepsis improving, WBC improving   Interim History / Subjective:   Patient feeling better.  No complaints this morning.  She does have some pain.  Objective   Blood pressure 113/71, pulse 88, temperature 98 F (36.7 C), temperature source Oral, resp. rate 17, last menstrual period 03/10/2009, SpO2 98 %.        Intake/Output  Summary (Last 24 hours) at 03/15/2022 0744 Last data filed at 03/15/2022 0023 Gross per 24 hour  Intake 249.98 ml  Output --  Net 249.98 ml   There were no vitals filed for this visit.  Examination: General: Chronically ill-appearing elderly female HENT: NCAT, tracking appropriately, bilateral angular chleitis  Lungs: Clear to auscultation bilaterally Cardiovascular: Regular rate rhythm, S1-S2 Abdomen: Soft, nontender nondistended Extremities: No significant edema Neuro: Alert oriented following commands GU: Deferred Skin: Bilateral hand and feet dermatitis, right hand and forearm edema and redness,  Resolved Hospital Problem list     Assessment & Plan:   Sepsis related to cellulitis of the right hand and forearm, improving  Desquamating vs Keratinolysis type picture, flaking chronic rash of the hands and feet, skin break down, fissures present, smaller areas with eschar formation  Bilateral angular cheilitis Bilateral lower extremity muscle wasting, I question the possibility of dry beri-beri type syndrome as recent echo shows no cardiomyopathy     - I suspect that this is from nutritional deficiencies, other differential diagnosis would include palmoplantar eczema but she does not have any plaques. Alternatively paraneoplastic syndromes can cause hand and feet dermatitis. - however many nutritional deficiencies, B2, B6, trytophan, zinc, thiamine,... etc, could cause this - Most nutritional deficiencies co-exist with each other.  - Patient is a prior alcoholic now with chronic pancreatitis taking Creon; therefore, has a preexisting condition of malabsorption syndrome  - Although  we could be dealing with something else this clinically makes the most sense. MRI feet, possible distal toe osteomyelitis, just saw podiatry outpatient  Anemia, baseline appears anemia of chronic disease, now with multiple lab draws has acute blood loss anemia from phlebotomy  Plan: Continue ceftriaxone and  vancomycin for cellulitis Continue empiric vitamin supplementation Appreciate wound care nurse input. Continue silvadene cream + kerlex  Will discuss timeline for antibiotics with infectious disease, we appreciate their input  I was thinking of de-escalation to doxy, but will await their recommendations  Will need repeat imaging of the feet or evaluation by podiatry at some point. Continue Creon Transfuse 1 unit PRBCs Encourage p.o. intake, if she is not getting enough calorie intake then we may need to consider core track placement and supplementation with nocturnal tube feeds because I do not believe she is meeting her calorie intake needs    Labs    CBC: Recent Labs  Lab 03/13/22 1520 03/14/22 0905 03/14/22 1625 03/14/22 2304 03/15/22 0523  WBC 22.9* 18.1* 16.9* 17.2* 13.2*  NEUTROABS  --   --  14.6*  --   --   HGB 8.5* 7.4* 7.0* 7.1* 6.5*  HCT 25.4* 22.5* 21.2* 21.6* 19.2*  MCV 96.9 95.7 96.8 96.9 96.0  PLT 152 164 164 194 175     Basic Metabolic Panel: Recent Labs  Lab 03/13/22 1520 03/14/22 0905 03/14/22 2304 03/15/22 0523  NA 133* 138  --  132*  K 5.4* 4.2  --  4.2  CL 104 107  --  102  CO2 24 25  --  24  GLUCOSE 141* 90  --  134*  BUN 24* 25*  --  22  CREATININE 1.02* 0.80 0.97 1.00  CALCIUM 7.6* 7.5*  --  7.2*  MG  --   --   --  1.8  PHOS  --   --   --  2.6    GFR: CrCl cannot be calculated (Unknown ideal weight.). Recent Labs  Lab 03/13/22 1520 03/13/22 1913 03/14/22 0905 03/14/22 1625 03/14/22 2304 03/15/22 0523  WBC 22.9*  --  18.1* 16.9* 17.2* 13.2*  LATICACIDVEN 3.3* 1.6  --   --   --   --      Liver Function Tests: No results for input(s): "AST", "ALT", "ALKPHOS", "BILITOT", "PROT", "ALBUMIN" in the last 168 hours. No results for input(s): "LIPASE", "AMYLASE" in the last 168 hours. No results for input(s): "AMMONIA" in the last 168 hours.  ABG No results found for: "PHART", "PCO2ART", "PO2ART", "HCO3", "TCO2", "ACIDBASEDEF", "O2SAT"    Coagulation Profile: No results for input(s): "INR", "PROTIME" in the last 168 hours.  Cardiac Enzymes: No results for input(s): "CKTOTAL", "CKMB", "CKMBINDEX", "TROPONINI" in the last 168 hours.  HbA1C: Hgb A1c MFr Bld  Date/Time Value Ref Range Status  01/06/2022 08:25 PM 4.6 (L) 4.8 - 5.6 % Final    Comment:    (NOTE) Pre diabetes:          5.7%-6.4%  Diabetes:              >6.4%  Glycemic control for   <7.0% adults with diabetes     CBG: No results for input(s): "GLUCAP" in the last 168 hours.  Home Medications  Prior to Admission medications   Medication Sig Start Date End Date Taking? Authorizing Provider  buprenorphine (BUTRANS) 20 MCG/HR PTWK Place 1 patch onto the skin once a week.   Yes [provider]  clobetasol ointment (TEMOVATE) 0.05 % Apply  1 Application topically 2 (two) times daily. APPLY TO AFFECTED AREAS OF HANDS AND FEET   Yes [provider]  famotidine (PEPCID) 10 MG tablet Take 10 mg by mouth 2 (two) times daily.   Yes [provider]  feeding supplement (ENSURE ENLIVE / ENSURE PLUS) LIQD Take 237 mLs by mouth 3 (three) times daily between meals. 01/25/22  Yes Briant Cedar, MD  furosemide (LASIX) 40 MG tablet Take 1 tablet (40 mg total) by mouth daily. Patient taking differently: Take 40 mg by mouth daily as needed for fluid or edema. 02/18/22  Yes Tressia Danas, MD  lidocaine (XYLOCAINE) 2 % solution Use as directed 15 mLs in the mouth or throat every 6 (six) hours as needed for mouth pain. 09/05/20  Yes Cardama, Amadeo Garnet, MD  lipase/protease/amylase (CREON) 12000-38000 units CPEP capsule Take 2 capsules (24,000 Units total) by mouth 3 (three) times daily with meals. 02/11/22  Yes Esterwood, Amy S, PA-C  Multiple Vitamin (MULITIVITAMIN WITH MINERALS) TABS Take 1 tablet by mouth daily.   Yes [provider]  ondansetron (ZOFRAN) 4 MG tablet Take 4 mg by mouth every 8 (eight) hours as needed for nausea  or vomiting.   Yes [provider]  pantoprazole (PROTONIX) 40 MG tablet Take 1 tablet (40 mg total) by mouth 2 (two) times daily before a meal. 02/11/22  Yes Esterwood, Amy S, PA-C  polyethylene glycol (MIRALAX / GLYCOLAX) 17 g packet Take 17 g by mouth 2 (two) times daily as needed for mild constipation. 01/25/22  Yes Briant Cedar, MD  potassium chloride SA (KLOR-CON M) 20 MEQ tablet Take 1 tablet (20 mEq total) by mouth 2 (two) times daily for 2 days. 02/18/22 03/13/22 Yes Tressia Danas, MD  potassium chloride SA (KLOR-CON M) 20 MEQ tablet Take 20 mEq by mouth 2 (two) times daily.   Yes [provider]  silver sulfADIAZINE (SILVADENE) 1 % cream Apply topically 2 (two) times daily. Patient taking differently: Apply 1 Application topically 2 (two) times daily as needed (blister burns). 01/25/22  Yes Briant Cedar, MD  tiZANidine (ZANAFLEX) 2 MG tablet Take 1 tablet (2 mg total) by mouth every 12 (twelve) hours as needed for muscle spasms. 01/25/22  Yes Briant Cedar, MD  vitamin B-12 (CYANOCOBALAMIN) 100 MCG tablet Take 100 mcg by mouth daily.   Yes [provider]  zinc sulfate 220 (50 Zn) MG capsule Take 1 capsule (220 mg total) by mouth daily. 01/09/22  Yes Almon Hercules, MD  Hyoscyamine Sulfate SL (LEVSIN/SL) 0.125 MG SUBL Place 1 each under the tongue every 6(six) hours as needed. Patient not taking: Reported on 03/14/2022 02/11/22   Esterwood, Amy S, PA-C  oxyCODONE-acetaminophen (PERCOCET/ROXICET) 5-325 MG tablet Take 1 tablet by mouth every 6 (six) hours as needed for severe pain. Patient not taking: Reported on 03/14/2022 02/28/22   Horton, Mayer Masker, MD  triamcinolone cream (KENALOG) 0.1 % Apply 1 Application topically 2 (two) times daily. Only to hands Patient not taking: Reported on 03/14/2022 02/28/22   Horton, Mayer Masker, MD  PROMETHAZINE HCL PO Take 4 mg by mouth.   06/06/11  [provider]  Ranitidine HCl (ZANTAC PO) Take  by mouth.    06/06/11  [provider]      Josephine Igo, DO [facility] 03/15/2022 7:44 AM

## 2022-03-16 ENCOUNTER — Inpatient Hospital Stay: Payer: Self-pay

## 2022-03-16 ENCOUNTER — Telehealth: Payer: Self-pay | Admitting: Physician Assistant

## 2022-03-16 DIAGNOSIS — R6520 Severe sepsis without septic shock: Secondary | ICD-10-CM | POA: Diagnosis not present

## 2022-03-16 DIAGNOSIS — A419 Sepsis, unspecified organism: Secondary | ICD-10-CM | POA: Diagnosis not present

## 2022-03-16 LAB — BPAM RBC
Blood Product Expiration Date: 202401272359
ISSUE DATE / TIME: 202401010959
Unit Type and Rh: 7300

## 2022-03-16 LAB — CBC
HCT: 36.8 % (ref 36.0–46.0)
Hemoglobin: 10.9 g/dL — ABNORMAL LOW (ref 12.0–15.0)
MCH: 30.7 pg (ref 26.0–34.0)
MCHC: 29.6 g/dL — ABNORMAL LOW (ref 30.0–36.0)
MCV: 103.7 fL — ABNORMAL HIGH (ref 80.0–100.0)
Platelets: 124 10*3/uL — ABNORMAL LOW (ref 150–400)
RBC: 3.55 MIL/uL — ABNORMAL LOW (ref 3.87–5.11)
RDW: 15.1 % (ref 11.5–15.5)
WBC: 10.2 10*3/uL (ref 4.0–10.5)
nRBC: 0 % (ref 0.0–0.2)

## 2022-03-16 LAB — RPR: RPR Ser Ql: NONREACTIVE

## 2022-03-16 LAB — TYPE AND SCREEN
ABO/RH(D): B POS
Antibody Screen: NEGATIVE
Unit division: 0

## 2022-03-16 LAB — BASIC METABOLIC PANEL
Anion gap: 8 (ref 5–15)
BUN: 20 mg/dL (ref 8–23)
CO2: 22 mmol/L (ref 22–32)
Calcium: 7.5 mg/dL — ABNORMAL LOW (ref 8.9–10.3)
Chloride: 103 mmol/L (ref 98–111)
Creatinine, Ser: 0.91 mg/dL (ref 0.44–1.00)
GFR, Estimated: >60 mL/min (ref >60)
Glucose, Bld: 182 mg/dL — ABNORMAL HIGH (ref 70–99)
Potassium: 4.1 mmol/L (ref 3.5–5.1)
Sodium: 133 mmol/L — ABNORMAL LOW (ref 135–145)

## 2022-03-16 LAB — HEPATITIS PANEL, ACUTE
HCV Ab: NONREACTIVE
Hep A IgM: NONREACTIVE
Hep B C IgM: NONREACTIVE

## 2022-03-16 MED ORDER — PROSOURCE PLUS PO LIQD
30.0000 mL | Freq: Two times a day (BID) | ORAL | Status: DC
  Administered 2022-03-16 – 2022-04-12 (×51): 30 mL via ORAL
  Filled 2022-03-16 (×52): qty 30

## 2022-03-16 MED ORDER — DEXTROSE 5 % IV SOLN: INTRAVENOUS | Status: AC

## 2022-03-16 NOTE — Progress Notes (Signed)
Brief Pharmacy Note  Note consult for TPN - order received after cutoff of 12 pm. TPN to start tomorrow, 1/3. Patient will be placed on D5 at 40 ml/hr until TPN can start.  Tawnya Crook, PharmD, BCPS Clinical Pharmacist 03/16/2022 2:41 PM

## 2022-03-16 NOTE — Progress Notes (Signed)
NAME:  Carolyn [name], MRN:  [id number], DOB:  12/16/1955, LOS: 2 ADMISSION DATE:  03/13/2022, CONSULTATION DATE: 03/14/2022 REFERRING MD: Dr. Karene Fry, CHIEF COMPLAINT: Dermatitis  History of Present Illness:   This is a 67 year old female, past medical history of chronic pancreatitis, history of prior alcoholism, gastroesophageal reflux, prior vitamin D deficiency documented.  She has known malabsorption issues and has been on Creon.  She presents with cellulitis and sepsis related to a ongoing rash of the hands and feet.  It appears that the rash has been going on for several weeks continued to get worse.  As started off with a small blister the blister then ruptured the skin started to dry out crack and flake.  She is starting to flake and desquamate portions of the hands and feet.  She developed increased pain and swelling in the right hand that developed redness that tracks into the forearm.  She presents to the ER with an elevated white count and was treated with antibiotics for concern of sepsis.  Pertinent  Medical History   Past Medical History:  Diagnosis Date   Alcoholic cirrhosis (HCC)    Chronic pancreatitis (HCC)    Endometriosis    GERD (gastroesophageal reflux disease)    Liver disease    Malabsorption    MALABSORPTION SYNDROME   Osteoporosis 03/2011   t score -2.5   Pancreatitis    due to cyst and tumors due to calcifications   Seizure (HCC)    no seizure disorder   Vitamin D deficiency 2012   VIT D 15     Significant Hospital Events: Including procedures, antibiotic start and stop dates in addition to other pertinent events   1/1 sepsis improving, WBC improving   Interim History / Subjective:   Patient feeling better.  Can not ambulate. PT recommends SNF.  Discussed with registered dietitian.  Recommend postpyloric NG versus TPN.  Patient states endoscopies have been difficult in the past, people not want to do them because of her anatomy after surgeries.   Discussed difficulty getting NG placed especially with these concerns.  She agrees to a trial of TPN for several days.  Objective   Blood pressure 115/79, pulse (!) 109, temperature 98.2 F (36.8 C), temperature source Oral, resp. rate 20, height 5\' 8"  (1.727 m), weight 46 kg, last menstrual period 03/10/2009, SpO2 97 %.        Intake/Output Summary (Last 24 hours) at 03/16/2022 1728 Last data filed at 03/16/2022 0900 Gross per 24 hour  Intake 610.97 ml  Output 300 ml  Net 310.97 ml    Filed Weights   03/16/22 0425  Weight: 46 kg    Examination: General: Chronically ill-appearing elderly female HENT: NCAT, tracking appropriately, bilateral angular chleitis  Lungs: Clear to auscultation bilaterally Cardiovascular: Regular rate rhythm, S1-S2 Abdomen: Soft, nontender nondistended Extremities: No significant edema Neuro: Alert oriented following commands GU: Deferred Skin: Bilateral hand and feet dermatitis, right hand and forearm edema and redness,  Resolved Hospital Problem list     Assessment & Plan:   Sepsis related to cellulitis of the right hand and forearm, improving  Desquamating vs Keratinolysis type picture, flaking chronic rash of the hands and feet, skin break down, fissures present, smaller areas with eschar formation  Bilateral angular cheilitis Bilateral lower extremity muscle wasting, I question the possibility of dry beri-beri type syndrome as recent echo shows no cardiomyopathy     - I suspect that this is from nutritional deficiencies, other differential diagnosis would  include palmoplantar eczema but she does not have any plaques. Alternatively paraneoplastic syndromes can cause hand and feet dermatitis. - however many nutritional deficiencies, B2, B6, trytophan, zinc, thiamine,... etc, could cause this - Most nutritional deficiencies co-exist with each other.  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 MRI feet, possible distal toe osteomyelitis, just saw podiatry outpatient  Anemia, baseline appears anemia of chronic disease, now with multiple lab draws has acute blood loss anemia from phlebotomy  Plan: Continue ceftriaxone and vancomycin, de-escalate to oral antibiotics as recommended by infectious disease and note 1/1 Continue empiric vitamin supplementation Appreciate wound care nurse input. Continue silvadene cream + kerlex   Will need repeat imaging of the feet or evaluation by podiatry at some point as outpatient Continue Creon Robust response to 1 unit PRBC transfusion 1/1 Not getting enough p.o. intake, per RD recommendation, TPN consult and PICC ordered Disposition is SNF is cannot ambulate, discussed with patient, she will discuss with husband   Labs    CBC: Recent Labs  Lab 03/14/22 0905 03/14/22 1625 03/14/22 2304 03/15/22 0523 03/16/22 0952  WBC 18.1* 16.9* 17.2* 13.2* 10.2  NEUTROABS  --  14.6*  --   --   --   HGB 7.4* 7.0* 7.1* 6.5* 10.9*  HCT 22.5* 21.2* 21.6* 19.2* 36.8  MCV 95.7 96.8 96.9 96.0 103.7*  PLT 164 164 194 175 124*     Basic Metabolic Panel: Recent Labs  Lab 03/13/22 1520 03/14/22 0905 03/14/22 2304 03/15/22 0523 03/16/22 0952  NA 133* 138  --  132* 133*  K 5.4* 4.2  --  4.2 4.1  CL 104 107  --  102 103  CO2 24 25  --  24 22  GLUCOSE 141* 90  --  134* 182*  BUN 24* 25*  --  22 20  CREATININE 1.02* 0.80 0.97 1.00 0.91  CALCIUM 7.6* 7.5*  --  7.2* 7.5*  MG  --   --   --  1.8  --   PHOS  --   --   --  2.6  --     GFR: Estimated Creatinine Clearance: 45.4 mL/min (by C-G formula based on SCr of 0.91 mg/dL). Recent Labs  Lab 03/13/22 1520 03/13/22 1913 03/14/22 0905 03/14/22 1625  03/14/22 2304 03/15/22 0523 03/16/22 0952  WBC 22.9*  --    < > 16.9* 17.2* 13.2* 10.2  LATICACIDVEN 3.3* 1.6  --   --   --   --   --    < > = values in this interval not displayed.     Liver Function Tests: No results for input(s): "AST", "ALT", "ALKPHOS", "BILITOT", "PROT", "ALBUMIN" in the last 168 hours. No results for input(s): "LIPASE", "AMYLASE" in the last 168 hours. No results for input(s): "AMMONIA" in the last 168 hours.  ABG No results found for: "PHART", "PCO2ART", "PO2ART", "HCO3", "TCO2", "ACIDBASEDEF", "O2SAT"   Coagulation Profile: No results for input(s): "INR", "PROTIME" in the last 168 hours.  Cardiac Enzymes: No results for input(s): "CKTOTAL", "CKMB", "CKMBINDEX", "TROPONINI" in the last 168 hours.  HbA1C: Hgb A1c MFr Bld  Date/Time Value Ref Range Status  01/06/2022 08:25 PM 4.6 (L) 4.8 - 5.6 % Final  Comment:    (NOTE) Pre diabetes:          5.7%-6.4%  Diabetes:              >6.4%  Glycemic control for   <7.0% adults with diabetes     CBG: No results for input(s): "GLUCAP" in the last 168 hours.  Home Medications  Prior to Admission medications   Medication Sig Start Date End Date Taking? Authorizing Provider  buprenorphine (BUTRANS) 20 MCG/HR PTWK Place 1 patch onto the skin once a week.   Yes [provider]  clobetasol ointment (TEMOVATE) 2.95 % Apply 1 Application topically 2 (two) times daily. APPLY TO AFFECTED AREAS OF HANDS AND FEET   Yes [provider]  famotidine (PEPCID) 10 MG tablet Take 10 mg by mouth 2 (two) times daily.   Yes [provider]  feeding supplement (ENSURE ENLIVE / ENSURE PLUS) LIQD Take 237 mLs by mouth 3 (three) times daily between meals. 01/25/22  Yes Alma Friendly, MD  furosemide (LASIX) 40 MG tablet Take 1 tablet (40 mg total) by mouth daily. Patient taking differently: Take 40 mg by mouth daily as needed for fluid or edema. 02/18/22  Yes Thornton Park, MD  lidocaine  (XYLOCAINE) 2 % solution Use as directed 15 mLs in the mouth or throat every 6 (six) hours as needed for mouth pain. 09/05/20  Yes Cardama, Grayce Sessions, MD  lipase/protease/amylase (CREON) 12000-38000 units CPEP capsule Take 2 capsules (24,000 Units total) by mouth 3 (three) times daily with meals. 02/11/22  Yes Esterwood, Amy S, PA-C  Multiple Vitamin (MULITIVITAMIN WITH MINERALS) TABS Take 1 tablet by mouth daily.   Yes [provider]  ondansetron (ZOFRAN) 4 MG tablet Take 4 mg by mouth every 8 (eight) hours as needed for nausea or vomiting.   Yes [provider]  pantoprazole (PROTONIX) 40 MG tablet Take 1 tablet (40 mg total) by mouth 2 (two) times daily before a meal. 02/11/22  Yes Esterwood, Amy S, PA-C  polyethylene glycol (MIRALAX / GLYCOLAX) 17 g packet Take 17 g by mouth 2 (two) times daily as needed for mild constipation. 01/25/22  Yes Alma Friendly, MD  potassium chloride SA (KLOR-CON M) 20 MEQ tablet Take 1 tablet (20 mEq total) by mouth 2 (two) times daily for 2 days. 02/18/22 03/13/22 Yes Thornton Park, MD  potassium chloride SA (KLOR-CON M) 20 MEQ tablet Take 20 mEq by mouth 2 (two) times daily.   Yes [provider]  silver sulfADIAZINE (SILVADENE) 1 % cream Apply topically 2 (two) times daily. Patient taking differently: Apply 1 Application topically 2 (two) times daily as needed (blister burns). 01/25/22  Yes Alma Friendly, MD  tiZANidine (ZANAFLEX) 2 MG tablet Take 1 tablet (2 mg total) by mouth every 12 (twelve) hours as needed for muscle spasms. 01/25/22  Yes Alma Friendly, MD  vitamin B-12 (CYANOCOBALAMIN) 100 MCG tablet Take 100 mcg by mouth daily.   Yes [provider]  zinc sulfate 220 (50 Zn) MG capsule Take 1 capsule (220 mg total) by mouth daily. 01/09/22  Yes Mercy Riding, MD  Hyoscyamine Sulfate SL (LEVSIN/SL) 0.125 MG SUBL Place 1 each under the tongue every 6(six) hours as needed. Patient not taking:  Reported on 03/14/2022 02/11/22   Esterwood, Amy S, PA-C  oxyCODONE-acetaminophen (PERCOCET/ROXICET) 5-325 MG tablet Take 1 tablet by mouth every 6 (six) hours as needed for severe pain. Patient not taking: Reported on 03/14/2022 02/28/22   Horton,  Mayer Masker, MD  triamcinolone cream (KENALOG) 0.1 % Apply 1 Application topically 2 (two) times daily. Only to hands Patient not taking: Reported on 03/14/2022 02/28/22   Horton, Mayer Masker, MD  PROMETHAZINE HCL PO Take 4 mg by mouth.   06/06/11  [provider]  Ranitidine HCl (ZANTAC PO) Take by mouth.    06/06/11  [provider]      Karren Burly, MD [facility] 03/16/2022 5:28 PM

## 2022-03-16 NOTE — Plan of Care (Signed)

## 2022-03-16 NOTE — Discharge Summary (Incomplete)
Physician Discharge Summary   Patient ID: Carolyn [name] MRN: [id number] DOB/AGE: 07-26-1955 67 y.o.  Admit date: 03/13/2022 Discharge date: 03/16/2022                     Discharge Plan by Diagnosis   Desquamating rash of bilateral hands and feet - DDx of nutritional deficiency (?P[place]agra) 2/2 malabsorption vs underlying dermatologic condition. Of note, has hx of vitamin D deficiency, EtOh dependence and chronic pancreatitis on Creon; therefore, she has pre-existing malabsorption syndrome. - Vitamin panel sent, results pending. - Transition abx from Vanc/Ceftriaxone to Doxycycline and Augmentin through 03/22/22 to complete 10 days of therapy. - F/u with ID as outpatient 03/22/22. - Will need to call Dermatology to schedule her follow up Ailene Ravel Dunzweiler in [place]). - Routine wound care. Per wound RN recs: cleanse hands and feet with soap and water and apply Silvadene cream to palms and soles of feet then cover with dry dressing and wrap with Kerlix.  Possible bilateral feet osteomyelitis - seen by Podiatry, last 03/02/22. - Podiatry follow up for bone biopsy to r/o osteo. Had appointment 1/3 but needs re-scheduling. I have sent a message to Dr. Jacqualyn Posey with [facility]. - F/u with ID as outpatient 03/22/22.  Hx chronic pancreatitis - on Creon. - Continue Creon.  Anemia - s/p 1u PRBC 03/15/22. Per pt, she is due to have an EGD and colonoscopy soon and it appears that she has an appoint with Dr. Tarri Glenn in GI on 04/02/22. - Monitor for ongoing bleeding. - F/u as planned for EGD and colonoscopy.   Discharge Summary  Carolyn [name] is a 67 y.o. y/o female with a PMH of chronic pancreatitis, history of prior alcoholism, gastroesophageal reflux, prior vitamin D deficiency documented. She has known malabsorption issues and has been on Creon. She presented to [facility] ED 12/30 with cellulitis and sepsis related to a ongoing rash of the hands and feet. It appeared that the rash had  been going on for several weeks and continued to get worse to the point that it began to flake and desquamate portions of the hands and feet.   The etiology of her desquamating rash was not clear and working DDx included nutritional deficiency (?[place]agra) 2/2 malabsorption vs underlying dermatologic condition. She has hx of vitamin D deficiency, EtOh dependence and chronic pancreatitis on Creon; thus, she has a pre-existing malabsorption syndrome. A vitamin panel was sent and results are pending. She was seen by ID who recommended transition of abx from Vanc/CTX to Doxy/Augmentin for 10 days total abx.  She had MRI of bilateral feet 12/30 that was noteable for marrow edema through multiple toes bilaterally with concern for underlying osteomyelitis. The left also noted apparent subacute fx lines of the great toe which was felt to be either incomplete healing from previous fx verus repeat trauma.  She was evaluated by PT 12/31 and given her requirements for frequent/constant assistance and supervision, skill nursing with short term rehab was recommended. A TOC was been placed.  On 1/1, she  had a Hgb of 6.5 (was 8.5 on admit). She had 1u PRBC transfused. F/u Hgb on 1/2 was ***  On 03/16/22, she was deemed medically stable and was cleared for discharge ***.         Significant Hospital Events   12/31 admit. 1/2 discharge.  Significant Diagnostic Studies  MRI R foot 12/30 > marrow edema of multiple toes, osteo vs stress related change. MRI L foot 12/30 >  marrow edema of multiple toes, osteo vs stress related change. Possible incomplete healing of previous great toe fx vs repeat trauma.  Micro Data  Blood 12/30 >   Antimicrobials  Vancomycin and Ceftriaxone 12/30 > 1/2. *** Doxycycline and Augmentin 1/2 > stop date planned for 1/8.  Consults  ID.  Objective:  Blood pressure 117/76, pulse (!) 109, temperature 98.3 F (36.8 C), temperature source Oral, resp. rate 20, height _0  (1.727 m),  weight 46 kg, last menstrual period 03/10/2009, SpO2 100 %.        Intake/Output Summary (Last 24 hours) at 03/16/2022 0915 Last data filed at 03/16/2022 0410 Gross per 24 hour  Intake 490.97 ml  Output 300 ml  Net 190.97 ml   Filed Weights   03/16/22 0425  Weight: 46 kg    Physical Examination: General: *** Neuro: *** HEENT: *** Cardiovascular: *** Lungs: *** Abdomen: *** Musculoskeletal: *** Skin: *** GU: ***   Discharge Labs:  BMET Recent Labs  Lab 03/13/22 1520 03/14/22 0905 03/14/22 2304 03/15/22 0523  NA 133* 138  --  132*  K 5.4* 4.2  --  4.2  CL 104 107  --  102  CO2 24 25  --  24  GLUCOSE 141* 90  --  134*  BUN 24* 25*  --  22  CREATININE 1.02* 0.80 0.97 1.00  CALCIUM 7.6* 7.5*  --  7.2*  MG  --   --   --  1.8  PHOS  --   --   --  2.6    CBC Recent Labs  Lab 03/14/22 1625 03/14/22 2304 03/15/22 0523  HGB 7.0* 7.1* 6.5*  HCT 21.2* 21.6* 19.2*  WBC 16.9* 17.2* 13.2*  PLT 164 194 175    Anti-Coagulation No results for input(s): "INR" in the last 168 hours.        Allergies as of 03/16/2022       Reactions   Peanut-containing Drug Products Anaphylaxis   Tree nuts     Med Rec must be completed prior to using this [place]***        Disposition: ***   Discharge Condition:  Carolyn [name] has met maximum benefit of inpatient care and is medically stable and cleared for discharge.  Patient is pending follow up as above. ***    Time spent on discharge: Greater than 30 minutes.   ***

## 2022-03-16 NOTE — Progress Notes (Signed)
Initial Nutrition Assessment  DOCUMENTATION CODES:   Severe malnutrition in context of chronic illness, Underweight  INTERVENTION:   -Ensure Plus High Protein po BID, each supplement provides 350 kcal and 20 grams of protein.   -Prosource Plus PO BID, each provides 100 kcals and 15g protein  -Continue current vitamin regimen  -MD checking multiple vitamin labs: selenium, Vitamin K, riboflavin, biotin, vitamin E, Vitamin B6, Vitamin A, Vitamin C, Thiamine  -Consider checking triene:tetraene ratio for possible essential fatty acid deficiency. Signs include: dry scaly rash -TPN (7-10 days) would help treat this  -Patient should continue Vitamin D and A supplementation as she has history of deficiency  -If unable to successfully place post-pyloric NGT, consider J-tube or TPN.  Nocturnal tube feeding recommendations: -Osmolite 1.5 @ 20 x 12 hours, advance by 10 ml every 24 hours to goal 50 ml/hr x 12 hours. Provides 900 kcals, 37g protein and 457 ml H2O.  NUTRITION DIAGNOSIS:   Severe Malnutrition related to chronic illness (chronic pancreatitis, h/o multiple gastric surgeries including gastric bypass) as evidenced by severe fat depletion, severe muscle depletion.  GOAL:   Patient will meet greater than or equal to 90% of their needs  MONITOR:   PO intake, Supplement acceptance, Weight trends, Labs, I & O's, Skin  REASON FOR ASSESSMENT:   Consult Assessment of nutrition requirement/status, Enteral/tube feeding initiation and management  ASSESSMENT:   67 year old female, past medical history of chronic pancreatitis, history of prior alcoholism, gastroesophageal reflux, prior vitamin D deficiency documented.  She has known malabsorption issues and has been on Creon.  She presents with cellulitis and sepsis related to a ongoing rash of the hands and feet. She presents to the ER with an elevated white count and was treated with antibiotics for concern of sepsis.  Patient reports  she is eating today. Consumed ~25% of her breakfast. Drinking Ensures. States  she has not been as compliant with her supplements as she should be. States that following her discharge in November she was taking Vitamin A but stopped. Has been drinking Ensure inconsistently. States her husband buys her the big cases of Joline Salt brand from [employer]. Pt states she is always hesitant to eat a lot of fats d/t her gallbladder being removed. Reminded patient that as long as she takes her Creon she is okay to consume fats in her diet.  Pt states she noticed the rash on her hands and feet worsening a few weeks ago. States they burn and are painful. Skin is sloughing off. She has deep cracks in her hands.  She denies any hair loss or excessive bleeding in her mouth. Pt states she did have blood in her stool.  Pt states she is open to tube feeding and would like NGT. Pt has history of J-tube and she reports having issues with this d/t consistent leaking.  Concerned that NGT may not be a successful placement d/t history of gastric bypass and other gastric surgeries. NGT needs to be post-pyloric for better absorption. May need another J-tube or have TPN started.  Would consider checking for essential fatty acid deficiency given malabsorption and history of pancreatitis. TPN would provide lipids to address this.  Per weight records, pt's weight had been trending down since October 2023. Current weight: 101 lbs.  Medications: Vitamin D, Pepcid, IV Folic acid, Creon, Niacin, Vitamin B-12, Zinc sulfate, IV Thiamine, Colace  Labs reviewed: Low Na   NUTRITION - FOCUSED PHYSICAL EXAM:  Flowsheet Row Most Recent Value  Orbital Region Severe depletion  Upper Arm Region Severe depletion  Thoracic and Lumbar Region Severe depletion  Buccal Region Severe depletion  Temple Region Severe depletion  Clavicle Bone Region Severe depletion  Clavicle and Acromion Bone Region Severe depletion  Scapular Bone Region Severe  depletion  Dorsal Hand Unable to assess  [bandaged]  Patellar Region Unable to assess  Anterior Thigh Region Unable to assess  Posterior Calf Region Unable to assess  Edema (RD Assessment) Mild  [RUE]  Hair Reviewed  [thinning]  Eyes Reviewed  Mouth Reviewed  Skin Reviewed  [red flaky hands and feet]  Nails Unable to assess  [bandaged]       Diet Order:   Diet Order             Diet regular Room service appropriate? Yes; Fluid consistency: Thin  Diet effective now                   EDUCATION NEEDS:   Education needs have been addressed  Skin:  Skin Assessment: Skin Integrity Issues: Skin Integrity Issues:: Other (Comment) Other: red, flaky rashes to bilateral heels, feet and hands  Last BM:  1/2 -type 2  Height:   Ht Readings from Last 1 Encounters:  03/16/22 5\' 8"  (1.727 m)    Weight:   Wt Readings from Last 1 Encounters:  03/16/22 46 kg    BMI:  Body mass index is 15.42 kg/m.  Estimated Nutritional Needs:   Kcal:  1900-2100  Protein:  95-105g  Fluid:  2L/day  Clayton Bibles, MS, RD, LDN Inpatient Clinical Dietitian Contact information available via Amion

## 2022-03-17 ENCOUNTER — Other Ambulatory Visit: Payer: Medicare PPO

## 2022-03-17 DIAGNOSIS — K860 Alcohol-induced chronic pancreatitis: Secondary | ICD-10-CM | POA: Diagnosis not present

## 2022-03-17 DIAGNOSIS — D638 Anemia in other chronic diseases classified elsewhere: Secondary | ICD-10-CM

## 2022-03-17 DIAGNOSIS — A419 Sepsis, unspecified organism: Secondary | ICD-10-CM | POA: Diagnosis not present

## 2022-03-17 DIAGNOSIS — L03116 Cellulitis of left lower limb: Secondary | ICD-10-CM | POA: Diagnosis not present

## 2022-03-17 DIAGNOSIS — K255 Chronic or unspecified gastric ulcer with perforation: Secondary | ICD-10-CM | POA: Diagnosis not present

## 2022-03-17 LAB — CBC WITH DIFFERENTIAL/PLATELET
Abs Immature Granulocytes: 0.11 10*3/uL — ABNORMAL HIGH (ref 0.00–0.07)
Basophils Absolute: 0.1 10*3/uL (ref 0.0–0.1)
Basophils Relative: 0 %
Eosinophils Absolute: 0.1 10*3/uL (ref 0.0–0.5)
Eosinophils Relative: 1 %
HCT: 28.1 % — ABNORMAL LOW (ref 36.0–46.0)
Hemoglobin: 9.3 g/dL — ABNORMAL LOW (ref 12.0–15.0)
Immature Granulocytes: 1 %
Lymphocytes Relative: 10 %
Lymphs Abs: 1.6 10*3/uL (ref 0.7–4.0)
MCH: 31.1 pg (ref 26.0–34.0)
MCHC: 33.1 g/dL (ref 30.0–36.0)
MCV: 94 fL (ref 80.0–100.0)
Monocytes Absolute: 0.9 10*3/uL (ref 0.1–1.0)
Monocytes Relative: 6 %
Neutro Abs: 13.2 10*3/uL — ABNORMAL HIGH (ref 1.7–7.7)
Neutrophils Relative %: 82 %
Platelets: 145 10*3/uL — ABNORMAL LOW (ref 150–400)
RBC: 2.99 MIL/uL — ABNORMAL LOW (ref 3.87–5.11)
RDW: 14.6 % (ref 11.5–15.5)
WBC: 16 10*3/uL — ABNORMAL HIGH (ref 4.0–10.5)
nRBC: 0 % (ref 0.0–0.2)

## 2022-03-17 LAB — BASIC METABOLIC PANEL
Anion gap: 7 (ref 5–15)
BUN: 16 mg/dL (ref 8–23)
CO2: 22 mmol/L (ref 22–32)
Calcium: 7.1 mg/dL — ABNORMAL LOW (ref 8.9–10.3)
Chloride: 102 mmol/L (ref 98–111)
Creatinine, Ser: 0.72 mg/dL (ref 0.44–1.00)
GFR, Estimated: >60 mL/min (ref >60)
Glucose, Bld: 152 mg/dL — ABNORMAL HIGH (ref 70–99)
Potassium: 4.1 mmol/L (ref 3.5–5.1)
Sodium: 131 mmol/L — ABNORMAL LOW (ref 135–145)

## 2022-03-17 LAB — PHOSPHORUS: Phosphorus: 1.8 mg/dL — ABNORMAL LOW (ref 2.5–4.6)

## 2022-03-17 LAB — MAGNESIUM: Magnesium: 1.4 mg/dL — ABNORMAL LOW (ref 1.7–2.4)

## 2022-03-17 LAB — VITAMIN E
Vitamin E (Alpha Tocopherol): 2.2 mg/L — ABNORMAL LOW (ref 9.0–29.0)
Vitamin E(Gamma Tocopherol): 0.6 mg/L (ref 0.5–4.9)

## 2022-03-17 LAB — MISC LABCORP TEST (SEND OUT): Labcorp test code: 123220

## 2022-03-17 LAB — VITAMIN A: Vitamin A (Retinoic Acid): 15.1 ug/dL — ABNORMAL LOW (ref 22.0–69.5)

## 2022-03-17 LAB — RHEUMATOID FACTOR: Rheumatoid fact SerPl-aCnc: 10.1 IU/mL (ref <14.0)

## 2022-03-17 LAB — SELENIUM SERUM: Selenium: 50 ug/L — ABNORMAL LOW (ref 93–198)

## 2022-03-17 LAB — GLUCOSE, CAPILLARY
Glucose-Capillary: 166 mg/dL — ABNORMAL HIGH (ref 70–99)
Glucose-Capillary: 174 mg/dL — ABNORMAL HIGH (ref 70–99)

## 2022-03-17 MED ORDER — CHLORHEXIDINE GLUCONATE CLOTH 2 % EX PADS
6.0000 | MEDICATED_PAD | Freq: Every day | CUTANEOUS | Status: DC
  Administered 2022-03-17 – 2022-04-12 (×27): 6 via TOPICAL

## 2022-03-17 MED ORDER — INSULIN ASPART 100 UNIT/ML IJ SOLN
0.0000 [IU] | Freq: Three times a day (TID) | INTRAMUSCULAR | Status: DC
  Administered 2022-03-18: 2 [IU] via SUBCUTANEOUS
  Administered 2022-03-18 – 2022-03-19 (×2): 1 [IU] via SUBCUTANEOUS

## 2022-03-17 MED ORDER — MAGNESIUM SULFATE 4 GM/100ML IV SOLN
4.0000 g | Freq: Once | INTRAVENOUS | Status: AC
  Administered 2022-03-17: 4 g via INTRAVENOUS
  Filled 2022-03-17: qty 100

## 2022-03-17 MED ORDER — SELENIUM 50 MCG PO TABS: 100.0000 ug | ORAL_TABLET | Freq: Every day | ORAL | Status: DC

## 2022-03-17 MED ORDER — ENOXAPARIN SODIUM 30 MG/0.3ML IJ SOSY
30.0000 mg | PREFILLED_SYRINGE | INTRAMUSCULAR | Status: DC
  Administered 2022-03-18 – 2022-03-22 (×5): 30 mg via SUBCUTANEOUS
  Filled 2022-03-17 (×5): qty 0.3

## 2022-03-17 MED ORDER — TRAVASOL 10 % IV SOLN
INTRAVENOUS | Status: AC
  Filled 2022-03-17: qty 374.4

## 2022-03-17 MED ORDER — SODIUM CHLORIDE 0.9% FLUSH
10.0000 mL | Freq: Two times a day (BID) | INTRAVENOUS | Status: DC
  Administered 2022-03-17: 20 mL
  Administered 2022-03-18 – 2022-03-19 (×2): 10 mL
  Administered 2022-03-19: 20 mL
  Administered 2022-03-21 – 2022-03-22 (×3): 10 mL
  Administered 2022-03-24 – 2022-03-25 (×2): 20 mL
  Administered 2022-03-26 – 2022-03-31 (×5): 10 mL
  Administered 2022-04-01: 20 mL
  Administered 2022-04-01 – 2022-04-08 (×11): 10 mL

## 2022-03-17 MED ORDER — VITAMIN A 3 MG (10000 UNIT) PO CAPS
10000.0000 [IU] | ORAL_CAPSULE | Freq: Every day | ORAL | Status: DC
  Filled 2022-03-17: qty 1

## 2022-03-17 MED ORDER — SODIUM PHOSPHATES 45 MMOLE/15ML IV SOLN
15.0000 mmol | Freq: Once | INTRAVENOUS | Status: AC
  Administered 2022-03-17: 15 mmol via INTRAVENOUS
  Filled 2022-03-17: qty 5

## 2022-03-17 MED ORDER — SODIUM CHLORIDE 0.9% FLUSH
10.0000 mL | INTRAVENOUS | Status: DC | PRN
  Administered 2022-03-24: 10 mL

## 2022-03-17 NOTE — Progress Notes (Signed)
Physical Therapy Treatment Patient Details Name: Carolyn [name] MRN: [id number] DOB: 1955-04-19 Today's Date: 03/17/2022   History of Present Illness 67 year old female with history of chronic pancreatitis, chronic pain syndrome, GERD, severe malnutrition, osteoporosis who presented to [facility] ED with LE "cellulitis due to breakdown from desquammating wounds" involving bil feet and to lesser extent hands    PT Comments    Bed level exercises on today. Pt reported rough day with pain. She participated well. Will continue to follow and progress activity as pt is able to tolerate. Continue to recommend SNF.    Recommendations for follow up therapy are one component of a multi-disciplinary discharge planning process, led by the attending physician.  Recommendations may be updated based on patient status, additional functional criteria and insurance authorization.  Follow Up Recommendations  Skilled nursing-short term rehab (<3 hours/day) Can patient physically be transported by private vehicle: No   Assistance Recommended at Discharge Frequent or constant Supervision/Assistance  Patient can return home with the following Two people to help with walking and/or transfers;A lot of help with bathing/dressing/bathroom;Assistance with cooking/housework;Assist for transportation;Help with stairs or ramp for entrance   Equipment Recommendations  None recommended by PT    Recommendations for Other Services       Precautions / Restrictions Precautions Precautions: Fall Restrictions Weight Bearing Restrictions: No Other Position/Activity Restrictions: Pt unable to attempt WB 2* pain level     Mobility  Bed Mobility               General bed mobility comments: Nt-pt reported increased pain so performed bed level exercises on today    Transfers                        Ambulation/Gait                   Stairs             Wheelchair Mobility    Modified Rankin  (Stroke Patients Only)       Balance                                            Cognition Arousal/Alertness: Awake/alert Behavior During Therapy: WFL for tasks assessed/performed Overall Cognitive Status: Within Functional Limits for tasks assessed                                 General Comments: pt tearful after session (i heard her crying from the hallway)        Exercises General Exercises - Lower Extremity Quad Sets: AROM, Both, 10 reps Heel Slides: AAROM, Both, 10 reps, AROM (with support under lower leg so heel didn't rub the bed) Straight Leg Raises: AROM, Both, 5 reps    General Comments        Pertinent Vitals/Pain Pain Assessment Pain Assessment: 0-10 Pain Score: 7  Pain Location: bilat feet Pain Intervention(s): Limited activity within patient's tolerance    Home Living                          Prior Function            PT Goals (current goals can now be found in the care plan section) Progress towards PT goals: Progressing toward  goals    Frequency    Min 2X/week      PT Plan Current plan remains appropriate    Co-evaluation              AM-PAC PT "6 Clicks" Mobility   Outcome Measure  Help needed turning from your back to your side while in a flat bed without using bedrails?: A Lot Help needed moving from lying on your back to sitting on the side of a flat bed without using bedrails?: A Lot Help needed moving to and from a bed to a chair (including a wheelchair)?: Total Help needed standing up from a chair using your arms (e.g., wheelchair or bedside chair)?: Total Help needed to walk in hospital room?: Total Help needed climbing 3-5 steps with a railing? : Total 6 Click Score: 8    End of Session   Activity Tolerance: Patient limited by pain Patient left: in bed;with call bell/phone within reach   PT Visit Diagnosis: Pain;History of falling (Z91.81);Difficulty in walking, not  elsewhere classified (R26.2) Pain - Right/Left:  (bil) Pain - part of body: Hand;Ankle and joints of foot;Leg     Time: 7867-5449 PT Time Calculation (min) (ACUTE ONLY): 17 min  Charges:  $Therapeutic Exercise: 8-22 mins                        Doreatha Massed, PT Acute Rehabilitation  Office: [phone]

## 2022-03-17 NOTE — TOC Initial Note (Signed)
Transition of Care [facility]) - Initial/Assessment Note    Patient Details  Name: Carolyn [name] MRN: [id number] Date of Birth: [birth year]  Transition of [facility]) CM/SW Contact:    Leeroy Cha, RN Phone Number: 03/17/2022, 8:30 AM  Clinical Narrative:                 Pt is suggesting snf placement will do work up.  Expected Discharge Plan: Skilled Nursing Facility Barriers to Discharge: Continued Medical Work up   Patient Goals and CMS Choice Patient states their goals for this hospitalization and ongoing recovery are:: to get well CMS Medicare.gov Compare Post Acute Care list provided to:: Patient Choice offered to / list presented to : Patient      Expected Discharge Plan and Services   Discharge Planning Services: CM Consult Post Acute Care Choice: [place] Living arrangements for the past 2 months: Single Family Home                                      Prior Living Arrangements/Services Living arrangements for the past 2 months: Single Family Home Lives with:: Self Patient language and need for interpreter reviewed:: Yes Do you feel safe going back to the place where you live?: Yes            Criminal Activity/Legal Involvement Pertinent to Current Situation/Hospitalization: No - Comment as needed  Activities of Daily Living Home Assistive Devices/Equipment: None ADL Screening (condition at time of admission) Patient's cognitive ability adequate to safely complete daily activities?: Yes Is the patient deaf or have difficulty hearing?: No Does the patient have difficulty seeing, even when wearing glasses/contacts?: No Does the patient have difficulty concentrating, remembering, or making decisions?: No Patient able to express need for assistance with ADLs?: Yes Does the patient have difficulty dressing or bathing?: Yes Independently performs ADLs?: No Communication: Independent Dressing (OT): Needs assistance Is this a change from  baseline?: Pre-admission baseline Grooming: Independent with device (comment) Feeding: Independent with device (comment) Bathing: Dependent Toileting: Dependent Is this a change from baseline?: Change from baseline, expected to last <3 days In/Out Bed: Appropriate for developmental age [place] in Home: Dependent Is this a change from baseline?: Pre-admission baseline Does the patient have difficulty walking or climbing stairs?: Yes Weakness of Legs: None Weakness of Arms/Hands: None  Permission Sought/Granted                  Emotional Assessment Appearance:: Appears stated age Attitude/Demeanor/Rapport: Engaged Affect (typically observed): Calm Orientation: : Oriented to Self, Oriented to Place, Oriented to  Time, Oriented to Situation Alcohol / Substance Use: Never Used Psych Involvement: No (comment)  Admission diagnosis:  Normocytic anemia [D64.9] Cellulitis of left lower extremity [L03.116] Sepsis ([place]) [A41.9] Sepsis, due to unspecified organism, unspecified whether acute organ dysfunction present [facility]) [A41.9] Patient Active Problem List   Diagnosis Date Noted   Sepsis ([place]) 03/14/2022   Severe sepsis ([place]) 03/13/2022   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   Abnormal CT of the abdomen 01/19/2022   Chronic gastric ulcer with perforation ([place]) 23/53/6144   Alcoholic cirrhosis of liver without ascites ([place]) 01/18/2022   Occult blood in stools 01/18/2022   Portal hypertensive gastropathy ([place]) 01/18/2022   Anasarca 01/12/2022   Acute respiratory failure with hypoxia ([place]) 01/11/2022   Hypoalbuminemia 01/11/2022   Pleural effusion 01/10/2022   Abdominal distention 01/10/2022   Physical deconditioning 01/09/2022  Pressure injury of skin 01/08/2022   Hypokalemia 01/07/2022   Hypomagnesemia 01/07/2022   AKI (acute kidney injury) ([place]) 01/07/2022   Protein-calorie malnutrition, severe ([place]) 01/07/2022   Cellulitis of right foot 01/07/2022   Normocytic anemia  01/07/2022   Osteomyelitis ([place]) 01/06/2022   Intractable nausea and vomiting 95/28/4132   Metabolic acidosis, increased anion gap 06/12/2020   Seizure ([place]) 05/08/2017   Chronic pain syndrome 05/08/2017   Gastroesophageal reflux disease without esophagitis 05/08/2017   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[place]) 05/08/2017   Pancreatic steatorrhea 05/08/2017   Osteoporosis 03/16/2011   Endometriosis    Malabsorption    Chronic pancreatitis ([place])    Vitamin D deficiency    PCP:  Pcp, No Pharmacy:   [place], [place] - [address] [place] [address] Phone: [phone] Fax: [phone]  [id number] - [address] [place] [address] Phone: [phone] Fax: [phone]  [id number] - [place], [address] [place] [address] Phone: [phone] Fax: [phone]     Social Determinants of Health (SDOH) Social History: SDOH Screenings   Food Insecurity: No Food Insecurity (03/15/2022)  Housing: Low Risk  (03/15/2022)  Transportation Needs: No Transportation Needs (03/15/2022)  Utilities: At Risk (03/15/2022)  Depression (PHQ2-9): Low Risk  (07/22/2020)  Tobacco Use: Low Risk  (03/13/2022)   SDOH Interventions:     Readmission Risk Interventions   No data to display

## 2022-03-17 NOTE — Progress Notes (Signed)
Peripherally Inserted Central Catheter Placement  The IV Nurse has discussed with the patient and/or persons authorized to consent for the patient, the purpose of this procedure and the potential benefits and risks involved with this procedure.  The benefits include less needle sticks, lab draws from the catheter, and the patient may be discharged home with the catheter. Risks include, but not limited to, infection, bleeding, blood clot (thrombus formation), and puncture of an artery; nerve damage and irregular heartbeat and possibility to perform a PICC exchange if needed/ordered by physician.  Alternatives to this procedure were also discussed.  Bard Power PICC patient education guide, fact sheet on infection prevention and patient information card has been provided to patient /or left at bedside.    PICC Placement Documentation  PICC Double Lumen 03/17/22 Left Brachial 39 cm 1 cm (Active)  Indication for Insertion or Continuance of Line Administration of hyperosmolar/irritating solutions (i.e. TPN, Vancomycin, etc.) 03/17/22 1200  Exposed Catheter (cm) 1 cm 03/17/22 1200  Site Assessment Clean, Dry, Intact 03/17/22 1200  Lumen #1 Status Flushed;Saline locked;Blood return noted 03/17/22 1200  Lumen #2 Status Flushed;Saline locked;Blood return noted 03/17/22 1200  Dressing Type Transparent;Securing device 03/17/22 1200  Dressing Status Antimicrobial disc in place 03/17/22 1200  Safety Lock Not Applicable 43/32/95 1884  Line Care Connections checked and tightened 03/17/22 1200  Dressing Intervention New dressing 03/17/22 1200  Dressing Change Due 03/24/22 03/17/22 1200       [name] 03/17/2022, 12:21 PM

## 2022-03-17 NOTE — Progress Notes (Signed)
PHARMACY - TOTAL PARENTERAL NUTRITION CONSULT NOTE   Indication:  chronic severe malnutrition with desquamating rash  Patient Measurements: Height: 5\' 8"  (172.7 cm) Weight: 46 kg (101 lb 6.6 oz) IBW/kg (Calculated) : 63.9 TPN AdjBW (KG): 46 Body mass index is 15.42 kg/m.  Assessment: 67 year old female with severe malnutrition related to chronic illness. Patient with desquamating rash likely related to significant nutritional/vitamin deficiencies. Pharmacy consulted to initiate TPN to accelerate improvement in nutritional deficiencies.  Glucose / Insulin: no hx diabetes noted Electrolytes: Mg, phos low; corrected Ca just below normal Renal: WNL Hepatic: last AST/ALT WNL, albumin low at 2.1 Intake / Output; MIVF: D5 at 40 ml/hr pending start of TPN GI Imaging: NA GI Surgeries / Procedures: NA  Central access: pending TPN start date: 1/3 if PICC placed  Nutritional Goals: Goal TPN rate is 80 mL/hr (provides 100 g of protein and 1954 kcals per day)  RD Assessment: Estimated Needs Total Energy Estimated Needs: 1900-2100 Total Protein Estimated Needs: 95-105g Total Fluid Estimated Needs: 2L/day  Current Nutrition: regular diet  Plan:  Now - mag 4 g IV + Na phos 15 mmol IV  -Start TPN at 30 mL/hr at 1800 (will advance slowly as patient at refeeding risk) -Electrolytes in TPN: Na 50 mEq/L, K 50 mEq/L, Ca 5 mEq/L, Mg 5 mEq/L, and Phos 15 mmol/L. Cl:Ac 1:1 -Add standard MVI and trace elements to TPN -Add thiamine 716 mg and folic acid 1 mg to TPN -Initiate Sensitive q8h SSI and adjust as needed  -Stop MIVF at 1800 -Monitor TPN labs on Mon/Thurs, daily while initiating/advancing TPN to monitor closely for refeeding  Tawnya Crook, PharmD, BCPS Clinical Pharmacist 03/17/2022 9:59 AM

## 2022-03-17 NOTE — Progress Notes (Signed)
Pharmacy Antibiotic Note  Carolyn [name] is a 67 y.o. female presented to [facility] ED on 03/13/2022 for sepsis secondary to cellulitis, possible osteomyelitis. Patient with desquamating vs keratinolysis type rash. ID saw patient and recommended 10 days of antibiotics. Pharmacy has been consulted for vancomycin dosing.  Plan: -Continue vancomycin 500 mg IV q24h -On ceftriaxone per MD -Continue to monitor renal function and clinical progress for dose adjustments and de-escalation as indicated   Height: 5\' 8"  (172.7 cm) Weight: 46 kg (101 lb 6.6 oz) IBW/kg (Calculated) : 63.9  Temp (24hrs), Avg:98.6 F (37 C), Min:98.2 F (36.8 C), Max:98.8 F (37.1 C)  Recent Labs  Lab 03/13/22 1520 03/13/22 1913 03/14/22 0905 03/14/22 1625 03/14/22 2304 03/15/22 0523 03/16/22 0952 03/17/22 0920  WBC 22.9*  --  18.1* 16.9* 17.2* 13.2* 10.2  --   CREATININE 1.02*  --  0.80  --  0.97 1.00 0.91 0.72  LATICACIDVEN 3.3* 1.6  --   --   --   --   --   --      Estimated Creatinine Clearance: 51.6 mL/min (by C-G formula based on SCr of 0.72 mg/dL).    Allergies  Allergen Reactions   Peanut-Containing Drug Products Anaphylaxis    Tree nuts    Antimicrobials this admission: 12/30 Ceftriaxone >> 12/30 Vancomycin >>  Dose adjustments this admission: --  Microbiology results: 12/30 BCx: ngtd   Thank you for allowing pharmacy to be a part of this patient's care.   Tawnya Crook, PharmD, BCPS Clinical Pharmacist 03/17/2022 9:47 AM

## 2022-03-17 NOTE — Progress Notes (Signed)
Nutrition Follow-up  DOCUMENTATION CODES:   Severe malnutrition in context of chronic illness, Underweight  INTERVENTION:   Monitor magnesium, potassium, and phosphorus for at least 3 days, MD to replete as needed, as pt is at risk for refeeding syndrome.   -TPN management per Pharmacy  -Ensure Plus High Protein po BID, each supplement provides 350 kcal and 20 grams of protein.    -Prosource Plus PO BID, each provides 100 kcals and 15g protein   -Continue current vitamin regimen  -MD checking multiple vitamin labs: selenium, Vitamin K, riboflavin, biotin, vitamin E, Vitamin B6, Vitamin A, Vitamin C, Thiamine   -Patient should continue Vitamin D and A supplementation at discharge as she has history of deficiency   -Daily weights while on TPN  NUTRITION DIAGNOSIS:   Severe Malnutrition related to chronic illness (chronic pancreatitis, h/o multiple gastric surgeries including gastric bypass) as evidenced by severe fat depletion, severe muscle depletion.  Ongoing.  GOAL:   Patient will meet greater than or equal to 90% of their needs  Progressing, starting TPN today.  MONITOR:   PO intake, Supplement acceptance, Weight trends, Labs, I & O's, Skin  REASON FOR ASSESSMENT:   Consult Assessment of nutrition requirement/status, Enteral/tube feeding initiation and management  ASSESSMENT:   67 year old female, past medical history of chronic pancreatitis, history of prior alcoholism, gastroesophageal reflux, prior vitamin D deficiency documented.  She has known malabsorption issues and has been on Creon.  She presents with cellulitis and sepsis related to a ongoing rash of the hands and feet. She presents to the ER with an elevated white count and was treated with antibiotics for concern of sepsis.  Patient starting TPN today following discussion with MD yesterday with concerns about pt's anatomy following multiple gastric surgeries and ability to successfully place post-pyloric  NGT. Per MD, pt reported that providers were unable to perform endoscopies given her anatomy.  PICC to be placed and TPN initiated this evening. If nutrition support needed long-term (which is likely given malabsorption) can discuss J-tube replacement with patient. Did state she didn't want another J-tube on 1/2 given her last tube was difficult to manage and leaked.   Per Pharmacy order, TPN starting at 30 ml/hr, providing 732 kcals and 37g protein. Thiamine and folic acid are added to TPN.   Admission weight: 101 lbs  Medications: Vitamin D, Pepcid, Creon, Niacin, Vitamin B-12, Zinc sulfate, IV Mg sulfate, IV sodium phos  Labs reviewed: Low Na Low Mg Low Phos  Riboflavin lab WNL All other vitamin labs still pending.  Diet Order:   Diet Order             Diet regular Room service appropriate? Yes; Fluid consistency: Thin  Diet effective now                   EDUCATION NEEDS:   Education needs have been addressed  Skin:  Skin Assessment: Skin Integrity Issues: Skin Integrity Issues:: Other (Comment) Other: red, flaky rashes to bilateral heels, feet and hands  Last BM:  1/3 -type 6  Height:   Ht Readings from Last 1 Encounters:  03/16/22 5\' 8"  (1.727 m)    Weight:   Wt Readings from Last 1 Encounters:  03/16/22 46 kg    BMI:  Body mass index is 15.42 kg/m.  Estimated Nutritional Needs:   Kcal:  1900-2100  Protein:  95-105g  Fluid:  2L/day  Clayton Bibles, MS, RD, LDN Inpatient Clinical Dietitian Contact information available via Amion

## 2022-03-17 NOTE — Progress Notes (Signed)
PROGRESS NOTE    Carolyn [name]  XLK:[id number] DOB: July 01, 1955 DOA: 03/13/2022 PCP: Pcp, No    Chief Complaint  Patient presents with   Skin Problem    Brief Narrative:  HPI This is a 67 year old female, past medical history of chronic pancreatitis, history of prior alcoholism, gastroesophageal reflux, prior vitamin D deficiency documented. She has known malabsorption issues and has been on Creon. She presents with cellulitis and sepsis related to a ongoing rash of the hands and feet. It appears that the rash has been going on for several weeks continued to get worse. As started off with a small blister the blister then ruptured the skin started to dry out crack and flake. She is starting to flake and desquamate portions of the hands and feet. She developed increased pain and swelling in the right hand that developed redness that tracks into the forearm. She presents to the ER with an elevated white count and was treated with antibiotics for concern of sepsis.   Assessment & Plan:   Principal Problem:   Severe sepsis (HCC) Active Problems:   Protein-calorie malnutrition, severe (HCC)   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HCC)   Chronic gastric ulcer with perforation (HCC)   Desquamative dermatitis   Sepsis (HCC)   Cellulitis of left lower extremity   Hypophosphatemia   Anemia of chronic disease   #1 severe sepsis secondary to discriminative dermatitis with superimposed cellulitis and possible underlying osteomyelitis -Patient noted to have presented with complicated nonhealing desquamative dermatitis involving hands and feet complicated by severe sepsis due to cellulitis/wound infection. -MRI of bilateral hands and feet done without any definite evidence of osteomyelitis. -Patient initially admitted to the critical care service, concern for probable vitamin/nutritional deficiencies versus paraneoplastic syndromes. -Concern for possible nutritional deficiencies, B2, B6, tryptophan,  zinc, thiamine etc. could lead to this. -Patient noted to be prior alcoholic now with a chronic pancreatitis on Creon and likely has pre-existing condition of malabsorption as well as possible gastric surgery unsure as to what the patient has had this on not however per PCCM. -Patient placed empirically on IV vancomycin, IV Rocephin which we will continue, patient seen by ID recommending transition to doxycycline and Augmentin to complete a 10-day course of antibiotics on discharge. -Continue vitamin D3, vitamin B12, zinc, vitamin D3. -Placed on vitamin A, selenium. -Will need follow-up with dermatology (we have followed in [place]) -Will need outpatient follow-up with podiatry for bone biopsy to evaluate for osteomyelitis. -Outpatient follow-up with ID which has been set for 03/22/2022. -Patient unable to ambulate seen by PT would likely require SNF placement.  2.  History of chronic alcoholic pancreatitis -Continue Creon.  3.  Hepatic cirrhosis -Being followed in the outpatient setting by [facility]. -BP soft and as such diuretics held spironolactone and Lasix held. -Patient with no hepatic encephalopathy, no evidence of abdominal ascites. -Outpatient follow-up.  4.  Gastric chronic ulcer with contained perforation -Stable. -Continue Pepcid twice daily.  PPI twice daily.  5.  Hypothyroidism -Continue Synthroid.  6.  Hypophosphatemia/hypomagnesemia -Magnesium sulfate 4 g IV x 1. -Sodium phosphate 30 mmol IV x 1. -Repeat labs in the AM.  7.  Severe protein calorie malnutrition -Patient seen by dietitian, PICC line placed and patient started on TPN per pharmacy.  8.  Anemia of chronic disease/folate deficiency -Hemoglobin noted to go as low as 6.5, status post transfusion 1 unit packed red blood cells.   Hemoglobin currently at 9.3.   DVT prophylaxis: Heparin Code Status: Full Family  Communication: Updated patient.  No family at bedside. Disposition:  SNF.  Status is: Inpatient Remains inpatient appropriate because: Severity of illness.  Unsafe disposition.   Consultants:  PCCM admission. Infectious disease: Dr. Thedore Mins 03/15/2022 Wound care RN 03/14/2022  Procedures:  Plain films of bilateral feet 03/13/2022 Chest x-ray 03/15/2022 MRI left foot and right foot 03/13/2022 PICC line placement 03/17/2022  Antimicrobials:  Anti-infectives (From admission, onward)    Start     Dose/Rate Route Frequency Ordered Stop   03/14/22 2200  vancomycin (VANCOREADY) IVPB 500 mg/100 mL        500 mg 100 mL/hr over 60 Minutes Intravenous Every 24 hours 03/13/22 2049     03/13/22 1915  vancomycin (VANCOCIN) IVPB 1000 mg/200 mL premix        1,000 mg 200 mL/hr over 60 Minutes Intravenous  Once 03/13/22 1908 03/14/22 0103   03/13/22 1615  cefTRIAXone (ROCEPHIN) 2 g in sodium chloride 0.9 % 100 mL IVPB        2 g 200 mL/hr over 30 Minutes Intravenous Every 24 hours 03/13/22 1603 03/20/22 1614         Subjective: Sitting up in bed.  Still with complaints of pains in the hands and feet she states is to be expected.  No chest pain.  No shortness of breath.  Tolerating oral intake.  Objective: Vitals:   03/16/22 1202 03/16/22 1945 03/17/22 0446 03/17/22 1238  BP: 115/79 114/78 115/81 (!) 141/75  Pulse: (!) 109 (!) 108 (!) 104 (!) 107  Resp: 20 16 14 18   Temp: 98.2 F (36.8 C) 98.7 F (37.1 C) 98.8 F (37.1 C) 98.7 F (37.1 C)  TempSrc: Oral Oral Oral Oral  SpO2: 97% 100% 97% 98%  Weight:      Height:        Intake/Output Summary (Last 24 hours) at 03/17/2022 1749 Last data filed at 03/17/2022 1500 Gross per 24 hour  Intake 1231.76 ml  Output 300 ml  Net 931.76 ml   Filed Weights   03/16/22 0425  Weight: 46 kg    Examination:  General exam: Appears calm and comfortable.  Cachectic. Respiratory system: Clear to auscultation.  No wheezes, no crackles, no rhonchi.  Fair air movement.  Speaking in full sentences.  No use of accessory  muscles of respiration.  Respiratory effort normal. Cardiovascular system: S1 & S2 heard, RRR. No JVD, murmurs, rubs, gallops or clicks. No pedal edema. Gastrointestinal system: Abdomen is nondistended, soft and nontender. No organomegaly or masses felt. Normal bowel sounds heard. Central nervous system: Alert and oriented.  Moving extremities spontaneously.  No focal neurological deficits. Extremities: Bilateral hands, bilateral feet wrapped in gauze/Kerlix.  Skin: No rashes, lesions or ulcers Psychiatry: Judgement and insight appear normal. Mood & affect appropriate.     Data Reviewed: I have personally reviewed following labs and imaging studies  CBC: Recent Labs  Lab 03/14/22 1625 03/14/22 2304 03/15/22 0523 03/16/22 0952 03/17/22 0920  WBC 16.9* 17.2* 13.2* 10.2 16.0*  NEUTROABS 14.6*  --   --   --  13.2*  HGB 7.0* 7.1* 6.5* 10.9* 9.3*  HCT 21.2* 21.6* 19.2* 36.8 28.1*  MCV 96.8 96.9 96.0 103.7* 94.0  PLT 164 194 175 124* 145*    Basic Metabolic Panel: Recent Labs  Lab 03/13/22 1520 03/14/22 0905 03/14/22 2304 03/15/22 0523 03/16/22 0952 03/17/22 0920  NA 133* 138  --  132* 133* 131*  K 5.4* 4.2  --  4.2 4.1 4.1  CL 104 107  --  102 103 102  CO2 24 25  --  24 22 22   GLUCOSE 141* 90  --  134* 182* 152*  BUN 24* 25*  --  22 20 16   CREATININE 1.02* 0.80 0.97 1.00 0.91 0.72  CALCIUM 7.6* 7.5*  --  7.2* 7.5* 7.1*  MG  --   --   --  1.8  --  1.4*  PHOS  --   --   --  2.6  --  1.8*    GFR: Estimated Creatinine Clearance: 51.6 mL/min (by C-G formula based on SCr of 0.72 mg/dL).  Liver Function Tests: No results for input(s): "AST", "ALT", "ALKPHOS", "BILITOT", "PROT", "ALBUMIN" in the last 168 hours.  CBG: Recent Labs  Lab 03/17/22 1718  GLUCAP 174*     Recent Results (from the past 240 hour(s))  Blood culture (routine x 2)     Status: None (Preliminary result)   Collection Time: 03/13/22  3:20 PM   Specimen: BLOOD  Result Value Ref Range Status    Specimen Description   Final    BLOOD BLOOD LEFT ARM Performed at [facility] [address], [place], [place] [address]    Special Requests   Final    BOTTLES DRAWN AEROBIC AND ANAEROBIC Blood Culture adequate volume Performed at [facility] [address], [place], [place] [address]    Culture   Final    NO GROWTH 4 DAYS Performed at [facility], [place] [address] [place], [place] [address]    Report Status PENDING  Incomplete  Blood culture (routine x 2)     Status: None (Preliminary result)   Collection Time: 03/13/22  3:20 PM   Specimen: BLOOD  Result Value Ref Range Status   Specimen Description   Final    BLOOD BLOOD RIGHT ARM Performed at [facility] [address], [place], [place] [address]    Special Requests   Final    BOTTLES DRAWN AEROBIC AND ANAEROBIC Blood Culture results may not be optimal due to an excessive volume of blood received in culture bottles Performed at [facility] [address] [place], [place] [address]    Culture   Final    NO GROWTH 4 DAYS Performed at [facility], [place] [address], [place], [place] [address]    Report Status PENDING  Incomplete         Radiology Studies: [place] EKG SITE RITE  Result Date: 03/16/2022 If Site Rite image not attached, placement could not be confirmed due to current cardiac rhythm.       Scheduled Meds:  (feeding supplement) PROSource Plus  30 mL Oral BID BM   sodium chloride   Intravenous Once   Chlorhexidine Gluconate Cloth  6 each Topical Daily   cholecalciferol  1,000 Units Oral Daily   famotidine  10 mg Oral BID   feeding supplement  237 mL Oral TID BM   heparin  5,000 Units Subcutaneous Q8H   insulin aspart  0-6 Units Subcutaneous Q8H   levothyroxine  75 mcg Intravenous Daily   lipase/protease/amylase  24,000 Units Oral TID WC   mupirocin cream   Topical Once   niacin  500 mg Oral BID WC    pantoprazole  40 mg Oral BID AC   silver sulfADIAZINE   Topical Daily   sodium chloride flush  10-40 mL Intracatheter Q12H   [START ON 03/18/2022] vitamin A  10,000 Units Oral Daily  vitamin B-12  100 mcg Oral Daily   zinc sulfate  220 mg Oral Daily   Continuous Infusions:  cefTRIAXone (ROCEPHIN)  IV 2 g (03/17/22 1736)   dextrose 40 mL/hr at 03/17/22 1318   magnesium sulfate bolus IVPB     sodium phosphate 15 mmol in dextrose 5 % 250 mL infusion 15 mmol (03/17/22 1324)   TPN ADULT (ION)     vancomycin 500 mg (03/16/22 2118)     LOS: 3 days    Time spent: 40 minutes    Irine Seal, MD [facility]   To contact the attending provider between 7A-7P or the covering provider during after hours 7P-7A, please log into the web site [email or web address] and access using universal [place] password for that web site. If you do not have the password, please call the hospital operator.  03/17/2022, 5:49 PM

## 2022-03-17 NOTE — TOC Progression Note (Signed)
Transition of Care [facility]) - Progression Note    Patient Details  Name: AYEZA THERRIAULT MRN: [id number] Date of Birth: [birth year]  Transition of Care [facility]) CM/SW Contact  Leeroy Cha, RN Phone Number: 03/17/2022, 1:06 PM  Clinical Narrative:    Damaris Schooner with Concha Norway the husband.  Stated that patient was suppos3ed to be referred to either [facility] or [facility] due to the sores on the bottom of her feet.  Suggested that we look into [facility] wound program [facility] [place].  Is agreeable with this. [facility] to [facility]   Expected Discharge Plan: [place] Barriers to Discharge: Continued Medical Work up  Expected Discharge Plan and Services   Discharge Planning Services: CM Consult Post Acute Care Choice: [place] arrangements for the past 2 months: Single Family Home                                       Social Determinants of Health (SDOH) Interventions SDOH Screenings   Food Insecurity: No Food Insecurity (03/15/2022)  Housing: Low Risk  (03/15/2022)  Transportation Needs: No Transportation Needs (03/15/2022)  Utilities: At Risk (03/15/2022)  Depression (PHQ2-9): Low Risk  (07/22/2020)  Tobacco Use: Low Risk  (03/13/2022)    Readmission Risk Interventions   No data to display

## 2022-03-17 NOTE — Plan of Care (Signed)
  Problem: Health Behavior/Discharge Planning: Goal: Ability to manage health-related needs will improve Outcome: Progressing   Problem: Clinical Measurements: Goal: Ability to maintain clinical measurements within normal limits will improve Outcome: Progressing   Problem: Nutrition: Goal: Adequate nutrition will be maintained Outcome: Progressing   Problem: Pain Managment: Goal: General experience of comfort will improve Outcome: Progressing   

## 2022-03-18 DIAGNOSIS — L03116 Cellulitis of left lower limb: Secondary | ICD-10-CM | POA: Diagnosis not present

## 2022-03-18 DIAGNOSIS — K860 Alcohol-induced chronic pancreatitis: Secondary | ICD-10-CM | POA: Diagnosis not present

## 2022-03-18 DIAGNOSIS — A419 Sepsis, unspecified organism: Secondary | ICD-10-CM | POA: Diagnosis not present

## 2022-03-18 DIAGNOSIS — K255 Chronic or unspecified gastric ulcer with perforation: Secondary | ICD-10-CM | POA: Diagnosis not present

## 2022-03-18 LAB — CULTURE, BLOOD (ROUTINE X 2)
Culture: NO GROWTH
Culture: NO GROWTH
Special Requests: ADEQUATE

## 2022-03-18 LAB — COMPREHENSIVE METABOLIC PANEL
ALT: 20 U/L (ref 0–44)
AST: 29 U/L (ref 15–41)
Albumin: <1.5 g/dL — ABNORMAL LOW (ref 3.5–5.0)
Alkaline Phosphatase: 182 U/L — ABNORMAL HIGH (ref 38–126)
Anion gap: 4 — ABNORMAL LOW (ref 5–15)
BUN: 17 mg/dL (ref 8–23)
CO2: 23 mmol/L (ref 22–32)
Calcium: 6.5 mg/dL — ABNORMAL LOW (ref 8.9–10.3)
Chloride: 101 mmol/L (ref 98–111)
Creatinine, Ser: 0.65 mg/dL (ref 0.44–1.00)
GFR, Estimated: >60 mL/min (ref >60)
Glucose, Bld: 199 mg/dL — ABNORMAL HIGH (ref 70–99)
Potassium: 3.7 mmol/L (ref 3.5–5.1)
Sodium: 128 mmol/L — ABNORMAL LOW (ref 135–145)
Total Bilirubin: 0.5 mg/dL (ref 0.3–1.2)
Total Protein: 4.4 g/dL — ABNORMAL LOW (ref 6.5–8.1)

## 2022-03-18 LAB — CBC WITH DIFFERENTIAL/PLATELET
Abs Immature Granulocytes: 0.09 10*3/uL — ABNORMAL HIGH (ref 0.00–0.07)
Basophils Absolute: 0.1 10*3/uL (ref 0.0–0.1)
Basophils Relative: 0 %
Eosinophils Absolute: 0.1 10*3/uL (ref 0.0–0.5)
Eosinophils Relative: 1 %
HCT: 20.9 % — ABNORMAL LOW (ref 36.0–46.0)
Hemoglobin: 6.8 g/dL — CL (ref 12.0–15.0)
Immature Granulocytes: 1 %
Lymphocytes Relative: 10 %
Lymphs Abs: 1.2 10*3/uL (ref 0.7–4.0)
MCH: 31.2 pg (ref 26.0–34.0)
MCHC: 32.5 g/dL (ref 30.0–36.0)
MCV: 95.9 fL (ref 80.0–100.0)
Monocytes Absolute: 0.8 10*3/uL (ref 0.1–1.0)
Monocytes Relative: 6 %
Neutro Abs: 10.4 10*3/uL — ABNORMAL HIGH (ref 1.7–7.7)
Neutrophils Relative %: 82 %
Platelets: 127 10*3/uL — ABNORMAL LOW (ref 150–400)
RBC: 2.18 MIL/uL — ABNORMAL LOW (ref 3.87–5.11)
RDW: 14.6 % (ref 11.5–15.5)
WBC: 12.7 10*3/uL — ABNORMAL HIGH (ref 4.0–10.5)
nRBC: 0 % (ref 0.0–0.2)

## 2022-03-18 LAB — VITAMIN B1: Vitamin B1 (Thiamine): 115.3 nmol/L (ref 66.5–200.0)

## 2022-03-18 LAB — PHOSPHORUS: Phosphorus: 2 mg/dL — ABNORMAL LOW (ref 2.5–4.6)

## 2022-03-18 LAB — MAGNESIUM: Magnesium: 2.8 mg/dL — ABNORMAL HIGH (ref 1.7–2.4)

## 2022-03-18 LAB — ANTINUCLEAR ANTIBODIES, IFA: ANA Ab, IFA: NEGATIVE

## 2022-03-18 LAB — GLUCOSE, CAPILLARY
Glucose-Capillary: 139 mg/dL — ABNORMAL HIGH (ref 70–99)
Glucose-Capillary: 201 mg/dL — ABNORMAL HIGH (ref 70–99)

## 2022-03-18 LAB — VITAMIN K1, SERUM: VITAMIN K1: 0.22 ng/mL (ref 0.10–2.20)

## 2022-03-18 LAB — HEMOGLOBIN AND HEMATOCRIT, BLOOD
HCT: 22.6 % — ABNORMAL LOW (ref 36.0–46.0)
Hemoglobin: 7.3 g/dL — ABNORMAL LOW (ref 12.0–15.0)

## 2022-03-18 LAB — TRIGLYCERIDES: Triglycerides: 46 mg/dL (ref <150)

## 2022-03-18 MED ORDER — VITAMIN A 3 MG (10000 UNIT) PO CAPS
100000.0000 [IU] | ORAL_CAPSULE | Freq: Every day | ORAL | Status: AC
  Administered 2022-03-18 – 2022-03-20 (×3): 100000 [IU] via ORAL
  Filled 2022-03-18 (×3): qty 10

## 2022-03-18 MED ORDER — VITAMIN E 45 MG (100 UNIT) PO CAPS
400.0000 [IU] | ORAL_CAPSULE | Freq: Every day | ORAL | Status: DC
  Administered 2022-03-18 – 2022-04-12 (×25): 400 [IU] via ORAL
  Filled 2022-03-18 (×26): qty 4

## 2022-03-18 MED ORDER — LEVOTHYROXINE SODIUM 50 MCG PO TABS
50.0000 ug | ORAL_TABLET | Freq: Every day | ORAL | Status: DC
  Administered 2022-03-18 – 2022-04-12 (×26): 50 ug via ORAL
  Filled 2022-03-18 (×26): qty 1

## 2022-03-18 MED ORDER — ALBUMIN HUMAN 25 % IV SOLN
25.0000 g | Freq: Once | INTRAVENOUS | Status: AC
  Administered 2022-03-18: 25 g via INTRAVENOUS
  Filled 2022-03-18: qty 100

## 2022-03-18 MED ORDER — CHLORHEXIDINE GLUCONATE CLOTH 2 % EX PADS: 6.0000 | MEDICATED_PAD | Freq: Every day | CUTANEOUS | Status: DC

## 2022-03-18 MED ORDER — SODIUM PHOSPHATES 45 MMOLE/15ML IV SOLN
15.0000 mmol | Freq: Once | INTRAVENOUS | Status: AC
  Administered 2022-03-18: 15 mmol via INTRAVENOUS
  Filled 2022-03-18: qty 5

## 2022-03-18 MED ORDER — TRAVASOL 10 % IV SOLN
INTRAVENOUS | Status: AC
  Filled 2022-03-18: qty 686.4

## 2022-03-18 NOTE — Progress Notes (Signed)
PROGRESS NOTE    Carolyn [name]  GBE:[id number] DOB: 12/08/1955 DOA: 03/13/2022 PCP: Pcp, No    Chief Complaint  Patient presents with   Skin Problem    Brief Narrative:  HPI This is a 67 year old female, past medical history of chronic pancreatitis, history of prior alcoholism, gastroesophageal reflux, prior vitamin D deficiency documented. She has known malabsorption issues and has been on Creon. She presents with cellulitis and sepsis related to a ongoing rash of the hands and feet. It appears that the rash has been going on for several weeks continued to get worse. As started off with a small blister the blister then ruptured the skin started to dry out crack and flake. She is starting to flake and desquamate portions of the hands and feet. She developed increased pain and swelling in the right hand that developed redness that tracks into the forearm. She presents to the ER with an elevated white count and was treated with antibiotics for concern of sepsis.   Assessment & Plan:   Principal Problem:   Severe sepsis (HCC) Active Problems:   Protein-calorie malnutrition, severe (HCC)   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HCC)   Chronic gastric ulcer with perforation (HCC)   Desquamative dermatitis   Sepsis (HCC)   Cellulitis of left lower extremity   Hypophosphatemia   Anemia of chronic disease   #1 severe sepsis secondary to discriminative dermatitis with superimposed cellulitis and possible underlying osteomyelitis -Patient noted to have presented with complicated nonhealing desquamative dermatitis involving hands and feet complicated by severe sepsis due to cellulitis/wound infection. -MRI of bilateral hands and feet done without any definite evidence of osteomyelitis. -Patient initially admitted to the critical care service, concern for probable vitamin/nutritional deficiencies versus paraneoplastic syndromes. -Concern for possible nutritional deficiencies, B2, B6, tryptophan,  zinc, thiamine etc. could lead to this. -Patient noted to be prior alcoholic now with a chronic pancreatitis on Creon and likely has pre-existing condition of malabsorption as well as possible gastric surgery unsure as to what the patient has had this on not however per PCCM. -Patient placed empirically on IV vancomycin, IV Rocephin which we will continue, patient seen by ID recommending transition to doxycycline and Augmentin to complete a 10-day course of antibiotics on discharge. -Could likely transition to oral Augmentin tomorrow 03/19/2022. -Continue vitamin D3, vitamin B12, zinc, vitamin D3. -Continue vitamin A, selenium. -Will need follow-up with dermatology (we have followed in [place]) -Will need outpatient follow-up with podiatry for bone biopsy to evaluate for osteomyelitis. -Outpatient follow-up with ID which has been set for 03/22/2022. -Patient unable to ambulate seen by PT would likely require SNF placement.  2.  History of chronic alcoholic pancreatitis -Continue Creon.  3.  Hepatic cirrhosis -Being followed in the outpatient setting by [facility]. -BP soft and as such diuretics held spironolactone and Lasix held. -Patient with no hepatic encephalopathy, no evidence of abdominal ascites. -Outpatient follow-up with GI.  4.  Gastric chronic ulcer with contained perforation -Stable. -Continue Pepcid twice daily.  PPI twice daily.  5.  Hypothyroidism -TSH noted at 33.958 on 03/15/2022. -Increase Synthroid to 50 mcg p.o. q. daily.  6.  Hypophosphatemia/hypomagnesemia -Magnesium at 2.8 this morning.   -Phosphorus at 2.0.   -Repletion per pharmacy.   -Repeat labs in the AM.    7.  Severe protein calorie malnutrition -Patient seen by dietitian, PICC line placed and patient started on TPN per pharmacy. -Albumin level noted at< 1.5 -IV albumin x 1.  8.  Anemia of  chronic disease/folate deficiency -Hemoglobin noted to go as low as 6.5, status post  transfusion 1 unit packed red blood cells.   Hemoglobin currently at 7.3 today. -, Transfusion threshold hemoglobin < 7.   DVT prophylaxis: Heparin Code Status: Full Family Communication: Updated patient.  No family at bedside. Disposition: SNF.  Status is: Inpatient Remains inpatient appropriate because: Severity of illness.  Unsafe disposition.   Consultants:  PCCM admission. Infectious disease: Dr. Thedore Mins 03/15/2022 Wound care RN 03/14/2022  Procedures:  Plain films of bilateral feet 03/13/2022 Chest x-ray 03/15/2022 MRI left foot and right foot 03/13/2022 PICC line placement 03/17/2022  Antimicrobials:  Anti-infectives (From admission, onward)    Start     Dose/Rate Route Frequency Ordered Stop   03/14/22 2200  vancomycin (VANCOREADY) IVPB 500 mg/100 mL        500 mg 100 mL/hr over 60 Minutes Intravenous Every 24 hours 03/13/22 2049     03/13/22 1915  vancomycin (VANCOCIN) IVPB 1000 mg/200 mL premix        1,000 mg 200 mL/hr over 60 Minutes Intravenous  Once 03/13/22 1908 03/14/22 0103   03/13/22 1615  cefTRIAXone (ROCEPHIN) 2 g in sodium chloride 0.9 % 100 mL IVPB        2 g 200 mL/hr over 30 Minutes Intravenous Every 24 hours 03/13/22 1603 03/20/22 1614         Subjective: Sleeping but easily arousable.  Denies any chest pain or shortness of breath.  Denies any abdominal pain.  Tolerating current diet.  Looks like patient is eating close to 50% of her meals.    Objective: Vitals:   03/17/22 0446 03/17/22 1238 03/17/22 2001 03/18/22 0526  BP: 115/81 (!) 141/75 121/85 (!) 129/96  Pulse: (!) 104 (!) 107 (!) 110 (!) 107  Resp: 14 18 16 19   Temp: 98.8 F (37.1 C) 98.7 F (37.1 C) 98.9 F (37.2 C) 98.3 F (36.8 C)  TempSrc: Oral Oral Oral Oral  SpO2: 97% 98% 99% 96%  Weight:    47 kg  Height:        Intake/Output Summary (Last 24 hours) at 03/18/2022 1157 Last data filed at 03/18/2022 1114 Gross per 24 hour  Intake 1401.77 ml  Output 450 ml  Net 951.77 ml     Filed Weights   03/16/22 0425 03/18/22 0526  Weight: 46 kg 47 kg    Examination:  General exam: Pallor.  Frail.  Cachectic. Respiratory system: Lungs clear to auscultation bilaterally.  No wheezes, no crackles, no rhonchi.  Fair air movement.  Speaking in full sentences.  No use of accessory muscles of respiration.   Cardiovascular system: Regular rate rhythm no murmurs rubs or gallops.  No JVD.  No lower extremity edema.  Gastrointestinal system: Abdomen is soft, nontender, nondistended, positive bowel sounds.  No rebound.  No guarding. Central nervous system: Alert and oriented.  Moving extremities spontaneously.  No focal neurological deficits. Extremities: Bilateral hands, bilateral feet wrapped in gauze/Kerlix.  Skin: No rashes, lesions or ulcers Psychiatry: Judgement and insight appear normal. Mood & affect appropriate.     Data Reviewed: I have personally reviewed following labs and imaging studies  CBC: Recent Labs  Lab 03/14/22 1625 03/14/22 2304 03/15/22 0523 03/16/22 0952 03/17/22 0920  WBC 16.9* 17.2* 13.2* 10.2 16.0*  NEUTROABS 14.6*  --   --   --  13.2*  HGB 7.0* 7.1* 6.5* 10.9* 9.3*  HCT 21.2* 21.6* 19.2* 36.8 28.1*  MCV 96.8 96.9 96.0 103.7* 94.0  PLT  164 194 175 124* 145*     Basic Metabolic Panel: Recent Labs  Lab 03/14/22 0905 03/14/22 2304 03/15/22 0523 03/16/22 0952 03/17/22 0920 03/18/22 0411  NA 138  --  132* 133* 131* 128*  K 4.2  --  4.2 4.1 4.1 3.7  CL 107  --  102 103 102 101  CO2 25  --  24 22 22 23   GLUCOSE 90  --  134* 182* 152* 199*  BUN 25*  --  22 20 16 17   CREATININE 0.80 0.97 1.00 0.91 0.72 0.65  CALCIUM 7.5*  --  7.2* 7.5* 7.1* 6.5*  MG  --   --  1.8  --  1.4* 2.8*  PHOS  --   --  2.6  --  1.8* 2.0*     GFR: Estimated Creatinine Clearance: 52.7 mL/min (by C-G formula based on SCr of 0.65 mg/dL).  Liver Function Tests: Recent Labs  Lab 03/18/22 0411  AST 29  ALT 20  ALKPHOS 182*  BILITOT 0.5  PROT 4.4*   ALBUMIN <1.5*    CBG: Recent Labs  Lab 03/17/22 1718 03/17/22 2356 03/18/22 0721  GLUCAP 174* 166* 139*      Recent Results (from the past 240 hour(s))  Blood culture (routine x 2)     Status: None   Collection Time: 03/13/22  3:20 PM   Specimen: BLOOD  Result Value Ref Range Status   Specimen Description   Final    BLOOD BLOOD LEFT ARM Performed at [facility] [address], [place], [place] [address]    Special Requests   Final    BOTTLES DRAWN AEROBIC AND ANAEROBIC Blood Culture adequate volume Performed at [facility] [address] [place], [place] [address]    Culture   Final    NO GROWTH 5 DAYS Performed at [facility], [place] [address], [place], [place] [address]    Report Status 03/18/2022 FINAL  Final  Blood culture (routine x 2)     Status: None   Collection Time: 03/13/22  3:20 PM   Specimen: BLOOD  Result Value Ref Range Status   Specimen Description   Final    BLOOD BLOOD RIGHT ARM Performed at [facility] [address] [place], [place] [address]    Special Requests   Final    BOTTLES DRAWN AEROBIC AND ANAEROBIC Blood Culture results may not be optimal due to an excessive volume of blood received in culture bottles Performed at [facility] [address], [place], [place] [address]    Culture   Final    NO GROWTH 5 DAYS Performed at [facility], [place] [address] [place],  [address]    Report Status 03/18/2022 FINAL  Final         Radiology Studies: [place] EKG SITE RITE  Result Date: 03/16/2022 If Site Rite image not attached, placement could not be confirmed due to current cardiac rhythm.       Scheduled Meds:  (feeding supplement) PROSource Plus  30 mL Oral BID BM   sodium chloride   Intravenous Once   Chlorhexidine Gluconate Cloth  6 each Topical Daily   cholecalciferol  1,000 Units Oral Daily   enoxaparin (LOVENOX) injection  30  mg Subcutaneous Q24H   famotidine  10 mg Oral BID   feeding supplement  237 mL Oral TID BM   insulin aspart  0-6 Units Subcutaneous Q8H   levothyroxine  50 mcg Oral Q0600   lipase/protease/amylase  24,000 Units Oral TID WC   mupirocin cream   Topical Once   niacin  500 mg Oral BID WC   pantoprazole  40 mg Oral BID AC   silver sulfADIAZINE   Topical Daily   sodium chloride flush  10-40 mL Intracatheter Q12H   vitamin A  100,000 Units Oral Daily   vitamin B-12  100 mcg Oral Daily   vitamin E  400 Units Oral Daily   zinc sulfate  220 mg Oral Daily   Continuous Infusions:  cefTRIAXone (ROCEPHIN)  IV 2 g (03/17/22 1736)   sodium phosphate 15 mmol in dextrose 5 % 250 mL infusion     TPN ADULT (ION) 30 mL/hr at 03/17/22 1847   TPN ADULT (ION)     vancomycin Stopped (03/18/22 0000)     LOS: 4 days    Time spent: 35 minutes    Irine Seal, MD [facility]   To contact the attending provider between 7A-7P or the covering provider during after hours 7P-7A, please log into the web site [email or web address] and access using universal [place] password for that web site. If you do not have the password, please call the hospital operator.  03/18/2022, 11:57 AM

## 2022-03-18 NOTE — Progress Notes (Signed)
PHARMACY - TOTAL PARENTERAL NUTRITION CONSULT NOTE   Indication:  chronic severe malnutrition with desquamating rash  Patient Measurements: Height: 5\' 8"  (172.7 cm) Weight: 47 kg (103 lb 9.9 oz) IBW/kg (Calculated) : 63.9 TPN AdjBW (KG): 46 Body mass index is 15.75 kg/m.  Assessment: 67 year old female with severe malnutrition related to chronic illness. Patient with desquamating rash likely related to significant nutritional/vitamin deficiencies. Pharmacy consulted to initiate TPN to accelerate improvement in nutritional deficiencies.  Glucose / Insulin: no hx diabetes noted -CBGs 139-174 -24 hr SSI 1u Electrolytes:  -Na low -Mg high (likely s/t pt receiving 8 g Mg) -Phos remains low with ongoing replacement -Corrected Ca okay in setting of significant hypoalbuminemia Renal: WNL Hepatic: last AST/ALT WNL, albumin low < 1.5 Intake / Output: MIVF d/c GI Imaging: NA GI Surgeries / Procedures: NA  Central access: 1/3 TPN start date: 1/3  Nutritional Goals: Goal TPN rate is 80 mL/hr (provides 100 g of protein and 1954 kcals per day)  RD Assessment: Estimated Needs Total Energy Estimated Needs: 1900-2100 Total Protein Estimated Needs: 95-105g Total Fluid Estimated Needs: 2L/day  Current Nutrition: regular diet  Plan:  Now - repeat Na phos 15 mmol IV  -Increase TPN to 55 mL/hr at 1800 (will advance slowly as patient at refeeding risk) -Electrolytes in TPN: Na 100 mEq/L, K 50 mEq/L, Ca 5 mEq/L, Mg 0 mEq/L, and Phos 15 mmol/L. Cl:Ac 1:1 -Add standard MVI and trace elements to TPN -Add thiamine 962 mg and folic acid 1 mg to TPN -Initiate Sensitive q8h SSI and adjust as needed  -Monitor TPN labs on Mon/Thurs, daily while initiating/advancing TPN to monitor closely for refeeding  Tawnya Crook, PharmD, BCPS Clinical Pharmacist 03/18/2022 10:42 AM

## 2022-03-18 NOTE — Care Management Important Message (Signed)
Important Message  Patient Details IM Letter given. Name: Carolyn [name] MRN: [id number] Date of Birth: Oct 04, 1955   Medicare Important Message Given:  Yes     Kerin Salen 03/18/2022, 2:06 PM

## 2022-03-18 NOTE — Progress Notes (Signed)
Nutrition Follow-up  DOCUMENTATION CODES:   Severe malnutrition in context of chronic illness, Underweight  INTERVENTION:   Monitor magnesium, potassium, and phosphorus for at least 3 days, MD to replete as needed, as pt is at risk for refeeding syndrome.   -TPN management per Pharmacy  -Recommend 100 mcg Selenium in next date's TPN to address deficiency  -Continue folic acid and Thiamine  -Supplement 100,000 units of Vitamin A x 3 days -followed by 50,000 units x 14 days -Recommend 5,000 units daily following repletion  -400 units Vitamin E daily  -Ensure Plus High Protein po BID, each supplement provides 350 kcal and 20 grams of protein.    -Prosource Plus PO BID, each provides 100 kcals and 15g protein  -MD checking multiple vitamin labs: selenium, Vitamin K, riboflavin, biotin, vitamin E, Vitamin B6, Vitamin A, Vitamin C, Thiamine   -Patient should continue Vitamin D and A supplementation at discharge as she has history of deficiency  -Daily weights while on TPN  NUTRITION DIAGNOSIS:   Severe Malnutrition related to chronic illness (chronic pancreatitis, h/o multiple gastric surgeries including gastric bypass) as evidenced by severe fat depletion, severe muscle depletion.  Ongoing.  GOAL:   Patient will meet greater than or equal to 90% of their needs  Progressing.  MONITOR:   PO intake, Supplement acceptance, Weight trends, Labs, I & O's, Skin, TPN  ASSESSMENT:   67 year old female, past medical history of chronic pancreatitis, history of prior alcoholism, gastroesophageal reflux, prior vitamin D deficiency documented.  She has known malabsorption issues and has been on Creon.  She presents with cellulitis and sepsis related to a ongoing rash of the hands and feet. She presents to the ER with an elevated white count and was treated with antibiotics for concern of sepsis.  Patient labs returned showing low Vitamin A, E and selenium levels. Selenium is  supplemented in TPN. Communicated recommendations to MD regarding Vitamin A and E supplementation.   TPN advancing to 55 ml/hr, providing 1343 kcals and 68g protein.   Patient is accepting all Ensure and Prosource supplements ordered.   Admission weight: 101 lbs Current weight: 103 lbs  Medications: Vitamin D, Pepcid, Creon, Niacin, Vitamin A, Vitamin B-12, Vitamin E, zinc sulfate, sodium phosphate   Labs reviewed: Low Na Low Phos Elevated Mg (2.8) TG: 46  Vitamin K WNL Vitamin A (15.1) low Vitamin E (2.2) low Selenium (50) low  Diet Order:   Diet Order             Diet regular Room service appropriate? Yes; Fluid consistency: Thin  Diet effective now                   EDUCATION NEEDS:   Education needs have been addressed  Skin:  Skin Assessment: Skin Integrity Issues: Skin Integrity Issues:: Other (Comment) Other: red, flaky rashes to bilateral heels, feet and hands  Last BM:  1/4 -type 6  Height:   Ht Readings from Last 1 Encounters:  03/16/22 5\' 8"  (1.727 m)    Weight:   Wt Readings from Last 1 Encounters:  03/18/22 47 kg    BMI:  Body mass index is 15.75 kg/m.  Estimated Nutritional Needs:   Kcal:  1900-2100  Protein:  95-105g  Fluid:  2L/day  Carolyn Bibles, MS, RD, LDN Inpatient Clinical Dietitian Contact information available via Amion

## 2022-03-18 NOTE — TOC Progression Note (Addendum)
Transition of Care [facility]) - Progression Note    Patient Details  Name: TELICIA HODGKISS MRN: [id number] Date of Birth: 1955/11/12  [facility]) CM/SW Contact  Leeroy Cha, RN Phone Number: 03/18/2022, 10:45 AM  Clinical Narrative:    Per Jake Bathe not enough criteria to meet [facility] at [facility].  Will pursue [facility]. Fl[place] to [facility] [place].   Expected Discharge Plan: [place] Barriers to Discharge: Continued Medical Work up  Expected Discharge Plan and Services   Discharge Planning Services: CM Consult Post Acute Care Choice: [place] arrangements for the past 2 months: Single Family Home                                       Social Determinants of Health (SDOH) Interventions SDOH Screenings   Food Insecurity: No Food Insecurity (03/15/2022)  Housing: Low Risk  (03/15/2022)  Transportation Needs: No Transportation Needs (03/15/2022)  Utilities: At Risk (03/15/2022)  Depression (PHQ2-9): Low Risk  (07/22/2020)  Tobacco Use: Low Risk  (03/13/2022)    Readmission Risk Interventions   No data to display

## 2022-03-18 NOTE — NC FL2 (Signed)
[place] MEDICAID FL2 LEVEL OF CARE FORM     IDENTIFICATION  Patient Name: Carolyn [name] Birthdate: 1955-06-27 Sex: female Admission Date (Current Location): 03/13/2022  [facility] and [place] Number:  Producer, television/film/video and Address:  [facility],  [address] [place], [place] [address]      Provider Number: [id number]  Attending Physician Name and Address:  Rodolph Bong, MD  Relative Name and Phone Number:       Current Level of Care: Hospital Recommended Level of Care: Skilled Nursing Facility Prior Approval Number:    Date Approved/Denied:   PASRR Number: [id number]  Discharge Plan: SNF    Current Diagnoses: Patient Active Problem List   Diagnosis Date Noted   Cellulitis of left lower extremity 03/17/2022   Hypophosphatemia 03/17/2022   Anemia of chronic disease 03/17/2022   Sepsis (HCC) 03/14/2022   Severe sepsis (HCC) 03/13/2022   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   Abnormal CT of the abdomen 01/19/2022   Chronic gastric ulcer with perforation (HCC) 01/19/2022   Alcoholic cirrhosis of liver without ascites (HCC) 01/18/2022   Occult blood in stools 01/18/2022   Portal hypertensive gastropathy (HCC) 01/18/2022   Anasarca 01/12/2022   Acute respiratory failure with hypoxia (HCC) 01/11/2022   Hypoalbuminemia 01/11/2022   Pleural effusion 01/10/2022   Abdominal distention 01/10/2022   Physical deconditioning 01/09/2022   Pressure injury of skin 01/08/2022   Hypokalemia 01/07/2022   Hypomagnesemia 01/07/2022   AKI (acute kidney injury) (HCC) 01/07/2022   Protein-calorie malnutrition, severe (HCC) 01/07/2022   Cellulitis of right foot 01/07/2022   Normocytic anemia 01/07/2022   Osteomyelitis (HCC) 01/06/2022   Intractable nausea and vomiting 06/13/2020   Metabolic acidosis, increased anion gap 06/12/2020   Seizure (HCC) 05/08/2017   Chronic pain syndrome 05/08/2017   Gastroesophageal reflux disease without esophagitis 05/08/2017    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HCC) 05/08/2017   Pancreatic steatorrhea 05/08/2017   Osteoporosis 03/16/2011   Endometriosis    Malabsorption    Chronic pancreatitis (HCC)    Vitamin D deficiency     Orientation RESPIRATION BLADDER Height & Weight     Self, Time, Situation, Place  Normal Continent Weight: 47 kg Height:  5\' 8"  (172.7 cm)  BEHAVIORAL SYMPTOMS/MOOD NEUROLOGICAL BOWEL NUTRITION STATUS      Continent Diet (regular)  AMBULATORY STATUS COMMUNICATION OF NEEDS Skin   Extensive Assist Verbally Other (Comment) (rash with blisters and cracking to both bottom of feet.  rash to hands.)                       Personal Care Assistance Level of Assistance  Bathing, Feeding, Dressing Bathing Assistance: Limited assistance Feeding assistance: Limited assistance Dressing Assistance: Limited assistance     Functional Limitations Info  Sight, Hearing, Speech Sight Info: Adequate Hearing Info: Adequate Speech Info: Adequate    SPECIAL CARE FACTORS FREQUENCY  PT (By licensed PT), OT (By licensed OT)     PT Frequency: 5 x weekly OT Frequency: 5 x weekly            Contractures Contractures Info: Not present    Additional Factors Info  Code Status Code Status Info: full             Current Medications (03/18/2022):  This is the current hospital active medication list Current Facility-Administered Medications  Medication Dose Route Frequency Provider Last Rate Last Admin   (feeding supplement) PROSource Plus liquid 30 mL  30 mL Oral BID BM Hunsucker, 05/17/2022  R, MD   30 mL at 03/17/22 1311   0.9 %  sodium chloride infusion (Manually program via Guardrails IV Fluids)   Intravenous Once Icard, Bradley L, DO   Stopped at 03/15/22 1600   acetaminophen (TYLENOL) tablet 650 mg  650 mg Oral Q4H PRN Icard, Bradley L, DO       cefTRIAXone (ROCEPHIN) 2 g in sodium chloride 0.9 % 100 mL IVPB  2 g Intravenous Q24H Beatty, Celeste A, PA-C 200 mL/hr at 03/17/22 1736 2 g at  03/17/22 1736   Chlorhexidine Gluconate Cloth 2 % PADS 6 each  6 each Topical Daily Eugenie Filler, MD   6 each at 03/17/22 1324   cholecalciferol (VITAMIN D3) 25 MCG (1000 UNIT) tablet 1,000 Units  1,000 Units Oral Daily Icard, Bradley L, DO   1,000 Units at 03/17/22 1053   docusate sodium (COLACE) capsule 100 mg  100 mg Oral BID PRN Icard, Bradley L, DO       enoxaparin (LOVENOX) injection 30 mg  30 mg Subcutaneous Q24H Eugenie Filler, MD       famotidine (PEPCID) tablet 10 mg  10 mg Oral BID Carlisle Cater, PA-C   10 mg at 03/17/22 2112   feeding supplement (ENSURE ENLIVE / ENSURE PLUS) liquid 237 mL  237 mL Oral TID BM Zada Finders R, MD   237 mL at 03/17/22 2000   insulin aspart (novoLOG) injection 0-6 Units  0-6 Units Subcutaneous Q8H Ellington, Tobe Sos, RPH   1 Units at 03/18/22 0000   levothyroxine (SYNTHROID, LEVOTHROID) injection 75 mcg  75 mcg Intravenous Daily Icard, Bradley L, DO   75 mcg at 03/17/22 1054   lipase/protease/amylase (CREON) capsule 24,000 Units  24,000 Units Oral TID WC Lenore Cordia, MD   24,000 Units at 03/18/22 0350   mupirocin cream (BACTROBAN) 2 %   Topical Once Beatty, Celeste A, PA-C       niacin (VITAMIN B3) tablet 500 mg  500 mg Oral BID WC Icard, Bradley L, DO   500 mg at 03/18/22 0822   ondansetron (ZOFRAN) injection 4 mg  4 mg Intravenous Q6H PRN Carlisle Cater, PA-C   4 mg at 03/15/22 1237   oxyCODONE (Oxy IR/ROXICODONE) immediate release tablet 5-10 mg  5-10 mg Oral Q6H PRN Icard, Bradley L, DO   10 mg at 03/18/22 0409   pantoprazole (PROTONIX) EC tablet 40 mg  40 mg Oral BID AC Zada Finders R, MD   40 mg at 03/18/22 0824   polyethylene glycol (MIRALAX / GLYCOLAX) packet 17 g  17 g Oral Daily PRN Icard, Bradley L, DO   17 g at 03/17/22 2343   silver sulfADIAZINE (SILVADENE) 1 % cream   Topical Daily [place] Eric J, DO   Given at 03/17/22 1054   sodium chloride flush (NS) 0.9 % injection 10-40 mL  10-40 mL Intracatheter Q12H Eugenie Filler, MD    20 mL at 03/17/22 2113   sodium chloride flush (NS) 0.9 % injection 10-40 mL  10-40 mL Intracatheter PRN Eugenie Filler, MD       sodium phosphate 15 mmol in dextrose 5 % 250 mL infusion  15 mmol Intravenous Once Ellington, Abby K, RPH       tiZANidine (ZANAFLEX) tablet 2 mg  2 mg Oral Q12H PRN Carlisle Cater, PA-C   2 mg at 03/18/22 0824   TPN ADULT (ION)   Intravenous Continuous TPN Tawnya Crook, RPH 30 mL/hr at 03/17/22 1847 New  Bag at 03/17/22 1847   TPN ADULT (ION)   Intravenous Continuous TPN Tawnya Crook, RPH       vancomycin (VANCOREADY) IVPB 500 mg/100 mL  500 mg Intravenous Q24H Luiz Ochoa, [facility]   Stopped at 03/18/22 0000   vitamin A capsule 100,000 Units  100,000 Units Oral Daily Eugenie Filler, MD       vitamin B-12 (CYANOCOBALAMIN) tablet 100 mcg  100 mcg Oral Daily Icard, Bradley L, DO   100 mcg at 03/17/22 1053   vitamin E capsule 400 Units  400 Units Oral Daily Eugenie Filler, MD       zinc sulfate capsule 220 mg  220 mg Oral Daily Icard, Bradley L, DO   220 mg at 03/17/22 1053     Discharge Medications: Please see discharge summary for a list of discharge medications.  Relevant Imaging Results:  Relevant Lab Results:   Additional Information SSN [id number]  Leeroy Cha, RN

## 2022-03-19 ENCOUNTER — Inpatient Hospital Stay (HOSPITAL_COMMUNITY): Payer: Medicare PPO

## 2022-03-19 DIAGNOSIS — R2231 Localized swelling, mass and lump, right upper limb: Secondary | ICD-10-CM

## 2022-03-19 DIAGNOSIS — K860 Alcohol-induced chronic pancreatitis: Secondary | ICD-10-CM | POA: Diagnosis not present

## 2022-03-19 DIAGNOSIS — K255 Chronic or unspecified gastric ulcer with perforation: Secondary | ICD-10-CM | POA: Diagnosis not present

## 2022-03-19 DIAGNOSIS — A419 Sepsis, unspecified organism: Secondary | ICD-10-CM | POA: Diagnosis not present

## 2022-03-19 DIAGNOSIS — M7989 Other specified soft tissue disorders: Secondary | ICD-10-CM

## 2022-03-19 DIAGNOSIS — L03116 Cellulitis of left lower limb: Secondary | ICD-10-CM | POA: Diagnosis not present

## 2022-03-19 LAB — MISC LABCORP TEST (SEND OUT): Labcorp test code: 70097

## 2022-03-19 LAB — BASIC METABOLIC PANEL
Anion gap: 4 — ABNORMAL LOW (ref 5–15)
BUN: 19 mg/dL (ref 8–23)
CO2: 25 mmol/L (ref 22–32)
Calcium: 7.2 mg/dL — ABNORMAL LOW (ref 8.9–10.3)
Chloride: 101 mmol/L (ref 98–111)
Creatinine, Ser: 0.64 mg/dL (ref 0.44–1.00)
GFR, Estimated: >60 mL/min (ref >60)
Glucose, Bld: 168 mg/dL — ABNORMAL HIGH (ref 70–99)
Potassium: 4.3 mmol/L (ref 3.5–5.1)
Sodium: 130 mmol/L — ABNORMAL LOW (ref 135–145)

## 2022-03-19 LAB — MAGNESIUM: Magnesium: 1.9 mg/dL (ref 1.7–2.4)

## 2022-03-19 LAB — GLUCOSE, CAPILLARY
Glucose-Capillary: 138 mg/dL — ABNORMAL HIGH (ref 70–99)
Glucose-Capillary: 144 mg/dL — ABNORMAL HIGH (ref 70–99)
Glucose-Capillary: 165 mg/dL — ABNORMAL HIGH (ref 70–99)
Glucose-Capillary: 194 mg/dL — ABNORMAL HIGH (ref 70–99)

## 2022-03-19 LAB — CBC WITH DIFFERENTIAL/PLATELET
Abs Immature Granulocytes: 0.18 10*3/uL — ABNORMAL HIGH (ref 0.00–0.07)
Basophils Absolute: 0.1 10*3/uL (ref 0.0–0.1)
Basophils Relative: 0 %
Eosinophils Absolute: 0.2 10*3/uL (ref 0.0–0.5)
Eosinophils Relative: 1 %
HCT: 23 % — ABNORMAL LOW (ref 36.0–46.0)
Hemoglobin: 7.5 g/dL — ABNORMAL LOW (ref 12.0–15.0)
Immature Granulocytes: 1 %
Lymphocytes Relative: 8 %
Lymphs Abs: 1.3 10*3/uL (ref 0.7–4.0)
MCH: 31.4 pg (ref 26.0–34.0)
MCHC: 32.6 g/dL (ref 30.0–36.0)
MCV: 96.2 fL (ref 80.0–100.0)
Monocytes Absolute: 1 10*3/uL (ref 0.1–1.0)
Monocytes Relative: 7 %
Neutro Abs: 12.3 10*3/uL — ABNORMAL HIGH (ref 1.7–7.7)
Neutrophils Relative %: 83 %
Platelets: 133 10*3/uL — ABNORMAL LOW (ref 150–400)
RBC: 2.39 MIL/uL — ABNORMAL LOW (ref 3.87–5.11)
RDW: 14.7 % (ref 11.5–15.5)
WBC: 14.9 10*3/uL — ABNORMAL HIGH (ref 4.0–10.5)
nRBC: 0 % (ref 0.0–0.2)

## 2022-03-19 LAB — PHOSPHORUS: Phosphorus: 2.7 mg/dL (ref 2.5–4.6)

## 2022-03-19 LAB — VITAMIN C

## 2022-03-19 LAB — VITAMIN B6: Vitamin B6: <1 ug/L — ABNORMAL LOW (ref 3.4–65.2)

## 2022-03-19 MED ORDER — AMOXICILLIN-POT CLAVULANATE 875-125 MG PO TABS
1.0000 | ORAL_TABLET | Freq: Two times a day (BID) | ORAL | Status: AC
  Administered 2022-03-19 – 2022-03-22 (×7): 1 via ORAL
  Filled 2022-03-19 (×7): qty 1

## 2022-03-19 MED ORDER — DOXYCYCLINE HYCLATE 100 MG PO TABS
100.0000 mg | ORAL_TABLET | Freq: Two times a day (BID) | ORAL | Status: AC
  Administered 2022-03-19 – 2022-03-22 (×7): 100 mg via ORAL
  Filled 2022-03-19 (×7): qty 1

## 2022-03-19 MED ORDER — MORPHINE SULFATE (PF) 2 MG/ML IV SOLN
1.0000 mg | INTRAVENOUS | Status: DC | PRN
  Administered 2022-03-19: 1 mg via INTRAVENOUS
  Filled 2022-03-19: qty 1

## 2022-03-19 MED ORDER — HYDROMORPHONE HCL 1 MG/ML IJ SOLN
1.0000 mg | INTRAMUSCULAR | Status: DC | PRN
  Administered 2022-03-19 – 2022-03-21 (×8): 1 mg via INTRAVENOUS
  Filled 2022-03-19 (×8): qty 1

## 2022-03-19 MED ORDER — TRAVASOL 10 % IV SOLN
INTRAVENOUS | Status: AC
  Filled 2022-03-19: qty 998.4

## 2022-03-19 MED ORDER — VITAMIN B-6 100 MG PO TABS
100.0000 mg | ORAL_TABLET | Freq: Every day | ORAL | Status: DC
  Administered 2022-03-19 – 2022-04-12 (×25): 100 mg via ORAL
  Filled 2022-03-19 (×26): qty 1

## 2022-03-19 MED ORDER — INSULIN ASPART 100 UNIT/ML IJ SOLN
0.0000 [IU] | Freq: Three times a day (TID) | INTRAMUSCULAR | Status: DC
  Administered 2022-03-19 – 2022-03-20 (×2): 2 [IU] via SUBCUTANEOUS
  Administered 2022-03-21 (×2): 3 [IU] via SUBCUTANEOUS

## 2022-03-19 NOTE — Progress Notes (Signed)
Right upper extremity venous duplex has been completed. Preliminary results can be found in CV Proc through chart review.   03/19/22 1:49 PM Carolyn [name] RVT

## 2022-03-19 NOTE — Progress Notes (Signed)
PROGRESS NOTE    Carolyn [name]  XBM:[id number] DOB: [birth year] DOA: 03/13/2022 PCP: Pcp, No    Chief Complaint  Patient presents with   Skin Problem    Brief Narrative:  HPI This is a 67 year old female, past medical history of chronic pancreatitis, history of prior alcoholism, gastroesophageal reflux, prior vitamin D deficiency documented. She has known malabsorption issues and has been on Creon. She presents with cellulitis and sepsis related to a ongoing rash of the hands and feet. It appears that the rash has been going on for several weeks continued to get worse. As started off with a small blister the blister then ruptured the skin started to dry out crack and flake. She is starting to flake and desquamate portions of the hands and feet. She developed increased pain and swelling in the right hand that developed redness that tracks into the forearm. She presents to the ER with an elevated white count and was treated with antibiotics for concern of sepsis.   Assessment & Plan:   Principal Problem:   Severe sepsis ([place]) Active Problems:   Protein-calorie malnutrition, severe (HCC)   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HCC)   Chronic gastric ulcer with perforation (HCC)   Desquamative dermatitis   Sepsis ([place])   Cellulitis of left lower extremity   Hypophosphatemia   Anemia of chronic disease   Localized swelling of right upper extremity   #1 severe sepsis secondary to discriminative dermatitis with superimposed cellulitis and possible underlying osteomyelitis -Patient noted to have presented with complicated nonhealing desquamative dermatitis involving hands and feet complicated by severe sepsis due to cellulitis/wound infection. -MRI of bilateral hands and feet done without any definite evidence of osteomyelitis. -Patient initially admitted to the critical care service, concern for probable vitamin/nutritional deficiencies versus paraneoplastic syndromes. -Concern for possible  nutritional deficiencies, B2, B6, tryptophan, zinc, thiamine etc. could lead to this. -Patient noted to be prior alcoholic now with a chronic pancreatitis on Creon and likely has pre-existing condition of malabsorption as well as possible gastric surgery unsure as to what the patient has had this on not however per PCCM. -Patient placed empirically on IV vancomycin, IV Rocephin which we will continue, patient seen by ID recommending transition to doxycycline and Augmentin to complete a 10-day course of antibiotics on discharge. -Transition to oral Augmentin and doxycycline 03/19/2022 to complete course of antibiotic treatment. -Continue vitamin D3, vitamin B12, zinc, vitamin D3. -Continue vitamin A, selenium. -Will need follow-up with dermatology (we have followed in [name]) -Will need outpatient follow-up with podiatry for bone biopsy to evaluate for osteomyelitis. -Outpatient follow-up with ID which has been set for 03/22/2022. -Patient unable to ambulate seen by PT would likely require SNF placement.  2.  History of chronic alcoholic pancreatitis -Creon.  3.  Hepatic cirrhosis -Being followed in the outpatient setting by [facility]. -BP soft and as such diuretics held spironolactone and Lasix held. -Patient with no hepatic encephalopathy, no evidence of abdominal ascites. -Outpatient follow-up with GI.  4.  Gastric chronic ulcer with contained perforation -Stable. -Continue Pepcid twice daily, PPI twice daily.  5.  Hypothyroidism -TSH noted at 33.958 on 03/15/2022. -Increased Synthroid to 50 mcg p.o. q. daily.  6.  Hypophosphatemia/hypomagnesemia -Magnesium at 1.9 this morning.   -Phosphorus at 2.7.   -Repletion per pharmacy.   -Repeat labs in the AM.    7.  Severe protein calorie malnutrition -Patient seen by dietitian, PICC line placed and patient started on TPN per pharmacy. -Albumin level noted  at< 1.5 -Status post IV albumin x 1.  8.  Anemia of  chronic disease/folate deficiency -Hemoglobin noted to go as low as 6.5, status post transfusion 1 unit packed red blood cells.   Hemoglobin currently at 7.5 today. -Transfusion threshold hemoglobin < 7.  9.  Right upper extremity swelling and pain -Check Dopplers of the right upper extremity to rule out DVT.   DVT prophylaxis: Heparin Code Status: Full Family Communication: Updated patient.  No family at bedside. Disposition: SNF.  Status is: Inpatient Remains inpatient appropriate because: Severity of illness.  Unsafe disposition.   Consultants:  PCCM admission. Infectious disease: Dr. Thedore Mins 03/15/2022 Wound care RN 03/14/2022  Procedures:  Plain films of bilateral feet 03/13/2022 Chest x-ray 03/15/2022 MRI left foot and right foot 03/13/2022 PICC line placement 03/17/2022  Antimicrobials:  Anti-infectives (From admission, onward)    Start     Dose/Rate Route Frequency Ordered Stop   03/19/22 2200  amoxicillin-clavulanate (AUGMENTIN) 875-125 MG per tablet 1 tablet        1 tablet Oral Every 12 hours 03/19/22 1115     03/19/22 2200  doxycycline (VIBRA-TABS) tablet 100 mg        100 mg Oral Every 12 hours 03/19/22 1115     03/14/22 2200  vancomycin (VANCOREADY) IVPB 500 mg/100 mL  Status:  Discontinued        500 mg 100 mL/hr over 60 Minutes Intravenous Every 24 hours 03/13/22 2049 03/19/22 1116   03/13/22 1915  vancomycin (VANCOCIN) IVPB 1000 mg/200 mL premix        1,000 mg 200 mL/hr over 60 Minutes Intravenous  Once 03/13/22 1908 03/14/22 0103   03/13/22 1615  cefTRIAXone (ROCEPHIN) 2 g in sodium chloride 0.9 % 100 mL IVPB        2 g 200 mL/hr over 30 Minutes Intravenous Every 24 hours 03/13/22 1603 03/19/22 1959         Subjective: Patient laying in bed.  Crying.  Complaining of pain in the hands and feet.  Complaining of pain in the right upper extremity states right upper extremity is swollen.    Objective: Vitals:   03/19/22 0500 03/19/22 0800 03/19/22 1047  03/19/22 1328  BP:   (!) 171/110 (!) 140/78  Pulse:   (!) 127 (!) 110  Resp:  12  16  Temp:   99.2 F (37.3 C) 99.1 F (37.3 C)  TempSrc:   Oral Oral  SpO2:   96% 96%  Weight: 51.5 kg     Height:        Intake/Output Summary (Last 24 hours) at 03/19/2022 1654 Last data filed at 03/19/2022 1519 Gross per 24 hour  Intake 2543.39 ml  Output 500 ml  Net 2043.39 ml   Filed Weights   03/16/22 0425 03/18/22 0526 03/19/22 0500  Weight: 46 kg 47 kg 51.5 kg    Examination:  General exam: Cachectic, frail, pallor.  Emaciated. Respiratory system: Lungs clear to auscultation bilaterally.  No wheezes, no crackles, no rhonchi.  Fair air movement.  Speaking in full sentences.  No use of accessory muscles of respiration.  Cardiovascular system: RRR, no murmurs rubs or gallops.  No JVD.  No lower extremity edema.  Gastrointestinal system: Abdomen is soft, nontender, nondistended, positive bowel sounds.  No rebound.  No guarding Central nervous system: Alert and oriented.  Moving extremities spontaneously.  No focal neurological deficits. Extremities: Bilateral hands, bilateral feet wrapped in gauze/Kerlix.  Right upper extremity with swelling and tenderness to palpation.  Skin: No rashes, lesions or ulcers Psychiatry: Judgement and insight appear normal. Mood & affect appropriate.     Data Reviewed: I have personally reviewed following labs and imaging studies  CBC: Recent Labs  Lab 03/14/22 1625 03/14/22 2304 03/15/22 0523 03/16/22 0952 03/17/22 0920 03/18/22 1209 03/18/22 1508 03/19/22 1200  WBC 16.9*   < > 13.2* 10.2 16.0* 12.7*  --  14.9*  NEUTROABS 14.6*  --   --   --  13.2* 10.4*  --  12.3*  HGB 7.0*   < > 6.5* 10.9* 9.3* 6.8* 7.3* 7.5*  HCT 21.2*   < > 19.2* 36.8 28.1* 20.9* 22.6* 23.0*  MCV 96.8   < > 96.0 103.7* 94.0 95.9  --  96.2  PLT 164   < > 175 124* 145* 127*  --  133*   < > = values in this interval not displayed.    Basic Metabolic Panel: Recent Labs  Lab  03/15/22 0523 03/16/22 0952 03/17/22 0920 03/18/22 0411 03/19/22 0830  NA 132* 133* 131* 128* 130*  K 4.2 4.1 4.1 3.7 4.3  CL 102 103 102 101 101  CO2 24 22 22 23 25   GLUCOSE 134* 182* 152* 199* 168*  BUN 22 20 16 17 19   CREATININE 1.00 0.91 0.72 0.65 0.64  CALCIUM 7.2* 7.5* 7.1* 6.5* 7.2*  MG 1.8  --  1.4* 2.8* 1.9  PHOS 2.6  --  1.8* 2.0* 2.7    GFR: Estimated Creatinine Clearance: 57.8 mL/min (by C-G formula based on SCr of 0.64 mg/dL).  Liver Function Tests: Recent Labs  Lab 03/18/22 0411  AST 29  ALT 20  ALKPHOS 182*  BILITOT 0.5  PROT 4.4*  ALBUMIN <1.5*    CBG: Recent Labs  Lab 03/18/22 1627 03/19/22 0008 03/19/22 0754 03/19/22 1228 03/19/22 1649  GLUCAP 201* 194* 144* 138* 165*     Recent Results (from the past 240 hour(s))  Blood culture (routine x 2)     Status: None   Collection Time: 03/13/22  3:20 PM   Specimen: BLOOD  Result Value Ref Range Status   Specimen Description   Final    BLOOD BLOOD LEFT ARM Performed at [facility], [address], [place], [place]    Special Requests   Final    BOTTLES DRAWN AEROBIC AND ANAEROBIC Blood Culture adequate volume Performed at [facility], [address] [place], [place]    Culture   Final    NO GROWTH 5 DAYS Performed at [name], [address], [place], [address] [place]    Report Status 03/18/2022 FINAL  Final  Blood culture (routine x 2)     Status: None   Collection Time: 03/13/22  3:20 PM   Specimen: BLOOD  Result Value Ref Range Status   Specimen Description   Final    BLOOD BLOOD RIGHT ARM Performed at [facility], [address], [place], [place]    Special Requests   Final    BOTTLES DRAWN AEROBIC AND ANAEROBIC Blood Culture results may not be optimal due to an excessive volume of blood received in culture bottles Performed at [facility], [address],  [place], [place]    Culture   Final    NO GROWTH 5 DAYS Performed at [facility], [address], [place], [address] [place]    Report Status 03/18/2022 FINAL  Final  Radiology Studies: VAS [place] UPPER EXTREMITY VENOUS DUPLEX  Result Date: 03/19/2022 UPPER VENOUS STUDY  Patient Name:  Carolyn [name]  Date of Exam:   03/19/2022 Medical Rec #: [id number]       Accession #:    [id number] Date of Birth: Sep 01, 1955       Patient Gender: F Patient Age:   24 years Exam Location:  [facility] Procedure:      VAS [place] UPPER EXTREMITY VENOUS DUPLEX Referring Phys: Ramiro Harvest --------------------------------------------------------------------------------  Indications: Swelling Risk Factors: None identified. Limitations: Poor ultrasound/tissue interface, bandages and open wound. Comparison Study: No prior studies. Performing Technologist: Chanda Busing RVT  Examination Guidelines: A complete evaluation includes B-mode imaging, spectral Doppler, color Doppler, and power Doppler as needed of all accessible portions of each vessel. Bilateral testing is considered an integral part of a complete examination. Limited examinations for reoccurring indications may be performed as noted.  Right Findings: +----------+------------+---------+-----------+----------+-------+ RIGHT     CompressiblePhasicitySpontaneousPropertiesSummary +----------+------------+---------+-----------+----------+-------+ IJV           Full       Yes       Yes                      +----------+------------+---------+-----------+----------+-------+ Subclavian    Full       Yes       Yes                      +----------+------------+---------+-----------+----------+-------+ Axillary      Full       Yes       Yes                      +----------+------------+---------+-----------+----------+-------+ Brachial      Full       Yes       Yes                       +----------+------------+---------+-----------+----------+-------+ Radial        Full                                          +----------+------------+---------+-----------+----------+-------+ Ulnar         Full                                          +----------+------------+---------+-----------+----------+-------+ Cephalic      Full                                          +----------+------------+---------+-----------+----------+-------+ Basilic       Full                                          +----------+------------+---------+-----------+----------+-------+  Left Findings: +----------+------------+---------+-----------+----------+-------+ LEFT      CompressiblePhasicitySpontaneousPropertiesSummary +----------+------------+---------+-----------+----------+-------+ Subclavian    Full       Yes       Yes                      +----------+------------+---------+-----------+----------+-------+  Summary:  Right: No evidence of deep vein thrombosis in the upper extremity. No evidence of superficial vein thrombosis in the upper extremity.  Left: No evidence of thrombosis in the subclavian.  *See table(s) above for measurements and observations.    Preliminary         Scheduled Meds:  (feeding supplement) PROSource Plus  30 mL Oral BID BM   sodium chloride   Intravenous Once   amoxicillin-clavulanate  1 tablet Oral Q12H   Chlorhexidine Gluconate Cloth  6 each Topical Daily   cholecalciferol  1,000 Units Oral Daily   doxycycline  100 mg Oral Q12H   enoxaparin (LOVENOX) injection  30 mg Subcutaneous Q24H   famotidine  10 mg Oral BID   feeding supplement  237 mL Oral TID BM   insulin aspart  0-9 Units Subcutaneous Q8H   levothyroxine  50 mcg Oral Q0600   lipase/protease/amylase  24,000 Units Oral TID WC   mupirocin cream   Topical Once   pantoprazole  40 mg Oral BID AC   pyridOXINE  100 mg Oral Daily   silver sulfADIAZINE   Topical Daily   sodium  chloride flush  10-40 mL Intracatheter Q12H   vitamin A  100,000 Units Oral Daily   vitamin B-12  100 mcg Oral Daily   vitamin E  400 Units Oral Daily   zinc sulfate  220 mg Oral Daily   Continuous Infusions:  cefTRIAXone (ROCEPHIN)  IV 2 g (03/18/22 1908)   TPN ADULT (ION) 55 mL/hr at 03/19/22 1519   TPN ADULT (ION)       LOS: 5 days    Time spent: 35 minutes    Irine Seal, MD [facility]   To contact the attending provider between 7A-7P or the covering provider during after hours 7P-7A, please log into the web site [email or web address] and access using universal [place] password for that web site. If you do not have the password, please call the hospital operator.  03/19/2022, 4:54 PM

## 2022-03-19 NOTE — Progress Notes (Signed)
PHARMACY - TOTAL PARENTERAL NUTRITION CONSULT NOTE   Indication:  chronic severe malnutrition with desquamating rash  Patient Measurements: Height: 5\' 8"  (172.7 cm) Weight: 51.5 kg (113 lb 8.6 oz) IBW/kg (Calculated) : 63.9 TPN AdjBW (KG): 46 Body mass index is 17.26 kg/m.  Assessment: 67 year old female with severe malnutrition related to chronic illness. Patient with desquamating rash likely related to significant nutritional/vitamin deficiencies. Pharmacy consulted to initiate TPN to accelerate improvement in nutritional deficiencies.  Glucose / Insulin: no hx diabetes noted -CBGs 139-201 -24 hr SSI 3u Electrolytes:  -Na low -Mg improved -Phos improved with replacement -Corrected Ca okay in setting of significant hypoalbuminemia Renal: WNL Hepatic: last AST/ALT WNL, albumin low < 1.5 Intake / Output: MIVF d/c GI Imaging: NA GI Surgeries / Procedures: NA  Central access: 1/3 TPN start date: 1/3  Nutritional Goals: Goal TPN rate is 80 mL/hr (provides 100 g of protein and 1954 kcals per day)  RD Assessment: Estimated Needs Total Energy Estimated Needs: 1900-2100 Total Protein Estimated Needs: 95-105g Total Fluid Estimated Needs: 2L/day  Current Nutrition: regular diet  Plan:  -Increase TPN to goal rate 80 mL/hr at 1800 -Electrolytes in TPN: Na 75 mEq/L, K 40 mEq/L, Ca 5 mEq/L, Mg 5 mEq/L, and Phos 15 mmol/L. Cl:Ac 1:1 -Add standard MVI and trace elements to TPN (provides 60 mcg selenium - adequate per discussion with RD) -Add thiamine 101 mg and folic acid 1 mg to TPN -Increase SSI to sensitive (from very sensitive) coverage q8h and adjust as needed, may need to increase frequency -Monitor TPN labs on Mon/Thurs at minimum, will recheck with morning labs given advancement to goal to ensure stability   Tawnya Crook, PharmD, BCPS Clinical Pharmacist 03/19/2022 9:55 AM

## 2022-03-19 NOTE — TOC Progression Note (Signed)
Transition of Care [facility]) - Progression Note    Patient Details  Name: Carolyn [name] MRN: [id number] Date of Birth: 1955/12/31  Transition of [facility] CM/SW [place], LCSW Phone Number: 03/19/2022, 12:02 PM  Clinical Narrative:    CSW spoke wit Whitney from [place] she reported she would not be able to accept pt if she is on TPN . TOC to follow.  Expected Discharge Plan: [place] Barriers to Discharge: Continued Medical Work up  Expected Discharge Plan and Services   Discharge Planning Services: CM Consult Post Acute Care Choice: [place] arrangements for the past 2 months: Single Family Home                                       Social Determinants of Health (SDOH) Interventions SDOH Screenings   Food Insecurity: No Food Insecurity (03/15/2022)  Housing: Low Risk  (03/15/2022)  Transportation Needs: No Transportation Needs (03/15/2022)  Utilities: At Risk (03/15/2022)  Depression (PHQ2-9): Low Risk  (07/22/2020)  Tobacco Use: Low Risk  (03/13/2022)    Readmission Risk Interventions     No data to display

## 2022-03-19 NOTE — Progress Notes (Signed)
Nutrition Brief Note  Patient continues TPN at 55 ml/hr. Will advance to goal rate of 80 ml/hr tonight. Pharmacy reporting that trace element product used can provide up to 60 mcg of selenium. Per repletion recommendations, 60-100 mcg of selenium IV per day. If pt is taken off TPN, pt needs to supplement 100 mcg selenium daily PO.   Vitamin B6 labs returned very low (<1). Communicated recommendations to MD for repletion. 100 mg daily x 30 days.   Labs and medications reviewed.   Will continue to follow.  If further nutrition issues arise, please consult RD.   Clayton Bibles, MS, RD, LDN Inpatient Clinical Dietitian Contact information available via Amion

## 2022-03-19 NOTE — Progress Notes (Signed)
   03/19/22 1047  Vitals  Temp 99.2 F (37.3 C)  Temp Source Oral  BP (!) 171/110  MAP (mmHg) 127  BP Method Automatic  Patient Position (if appropriate) Sitting  Pulse Rate (!) 127  Pulse Rate Source Monitor   Pt is in intense pain & crying. Had given zanaflex earlier. Will admin prn pain med, inform MD & request further medication

## 2022-03-19 NOTE — Progress Notes (Signed)
PT currently in severe pain. Requested additional pain med from attending. PT refused re-positioning at this time, r/t severe pain. Will Follow up

## 2022-03-19 NOTE — Progress Notes (Signed)
Informed Dr. Grandville Silos in person, Karen Chafe just reported pt had 7 beats of VT

## 2022-03-20 DIAGNOSIS — I4729 Other ventricular tachycardia: Secondary | ICD-10-CM | POA: Insufficient documentation

## 2022-03-20 DIAGNOSIS — K860 Alcohol-induced chronic pancreatitis: Secondary | ICD-10-CM | POA: Diagnosis not present

## 2022-03-20 DIAGNOSIS — A419 Sepsis, unspecified organism: Secondary | ICD-10-CM | POA: Diagnosis not present

## 2022-03-20 DIAGNOSIS — K255 Chronic or unspecified gastric ulcer with perforation: Secondary | ICD-10-CM | POA: Diagnosis not present

## 2022-03-20 DIAGNOSIS — L03116 Cellulitis of left lower limb: Secondary | ICD-10-CM | POA: Diagnosis not present

## 2022-03-20 LAB — CBC WITH DIFFERENTIAL/PLATELET
Abs Immature Granulocytes: 0.17 10*3/uL — ABNORMAL HIGH (ref 0.00–0.07)
Basophils Absolute: 0.1 10*3/uL (ref 0.0–0.1)
Basophils Relative: 0 %
Eosinophils Absolute: 0.1 10*3/uL (ref 0.0–0.5)
Eosinophils Relative: 1 %
HCT: 22.5 % — ABNORMAL LOW (ref 36.0–46.0)
Hemoglobin: 7.1 g/dL — ABNORMAL LOW (ref 12.0–15.0)
Immature Granulocytes: 2 %
Lymphocytes Relative: 9 %
Lymphs Abs: 1.1 10*3/uL (ref 0.7–4.0)
MCH: 31 pg (ref 26.0–34.0)
MCHC: 31.6 g/dL (ref 30.0–36.0)
MCV: 98.3 fL (ref 80.0–100.0)
Monocytes Absolute: 0.7 10*3/uL (ref 0.1–1.0)
Monocytes Relative: 6 %
Neutro Abs: 9.5 10*3/uL — ABNORMAL HIGH (ref 1.7–7.7)
Neutrophils Relative %: 82 %
Platelets: 126 10*3/uL — ABNORMAL LOW (ref 150–400)
RBC: 2.29 MIL/uL — ABNORMAL LOW (ref 3.87–5.11)
RDW: 15 % (ref 11.5–15.5)
WBC: 11.6 10*3/uL — ABNORMAL HIGH (ref 4.0–10.5)
nRBC: 0 % (ref 0.0–0.2)

## 2022-03-20 LAB — BASIC METABOLIC PANEL
Anion gap: 5 (ref 5–15)
BUN: 22 mg/dL (ref 8–23)
CO2: 25 mmol/L (ref 22–32)
Calcium: 7.1 mg/dL — ABNORMAL LOW (ref 8.9–10.3)
Chloride: 103 mmol/L (ref 98–111)
Creatinine, Ser: 0.61 mg/dL (ref 0.44–1.00)
GFR, Estimated: >60 mL/min (ref >60)
Glucose, Bld: 228 mg/dL — ABNORMAL HIGH (ref 70–99)
Potassium: 4.5 mmol/L (ref 3.5–5.1)
Sodium: 133 mmol/L — ABNORMAL LOW (ref 135–145)

## 2022-03-20 LAB — PREPARE RBC (CROSSMATCH)

## 2022-03-20 LAB — GLUCOSE, CAPILLARY
Glucose-Capillary: 190 mg/dL — ABNORMAL HIGH (ref 70–99)
Glucose-Capillary: 197 mg/dL — ABNORMAL HIGH (ref 70–99)
Glucose-Capillary: 227 mg/dL — ABNORMAL HIGH (ref 70–99)

## 2022-03-20 LAB — MAGNESIUM: Magnesium: 1.7 mg/dL (ref 1.7–2.4)

## 2022-03-20 LAB — PHOSPHORUS: Phosphorus: 2.9 mg/dL (ref 2.5–4.6)

## 2022-03-20 MED ORDER — FUROSEMIDE 10 MG/ML IJ SOLN
20.0000 mg | Freq: Once | INTRAMUSCULAR | Status: AC
  Administered 2022-03-20: 20 mg via INTRAVENOUS
  Filled 2022-03-20: qty 2

## 2022-03-20 MED ORDER — HYDROMORPHONE HCL 1 MG/ML IJ SOLN: 0.5000 mg | Freq: Once | INTRAMUSCULAR | Status: DC

## 2022-03-20 MED ORDER — DIPHENHYDRAMINE HCL 25 MG PO CAPS
25.0000 mg | ORAL_CAPSULE | Freq: Once | ORAL | Status: AC
  Administered 2022-03-20: 25 mg via ORAL
  Filled 2022-03-20: qty 1

## 2022-03-20 MED ORDER — ACETAMINOPHEN 325 MG PO TABS
650.0000 mg | ORAL_TABLET | Freq: Once | ORAL | Status: AC
  Administered 2022-03-20: 650 mg via ORAL
  Filled 2022-03-20: qty 2

## 2022-03-20 MED ORDER — SODIUM CHLORIDE 0.9% IV SOLUTION: Freq: Once | INTRAVENOUS | Status: AC

## 2022-03-20 MED ORDER — TRAVASOL 10 % IV SOLN
INTRAVENOUS | Status: AC
  Filled 2022-03-20: qty 998.4

## 2022-03-20 MED ORDER — MAGNESIUM SULFATE 2 GM/50ML IV SOLN
2.0000 g | Freq: Once | INTRAVENOUS | Status: AC
  Administered 2022-03-20: 2 g via INTRAVENOUS
  Filled 2022-03-20: qty 50

## 2022-03-20 NOTE — Progress Notes (Signed)
PHARMACY - TOTAL PARENTERAL NUTRITION CONSULT NOTE   Indication:  chronic severe malnutrition with desquamating rash  Patient Measurements: Height: 5\' 8"  (172.7 cm) Weight: 54.5 kg (120 lb 2.4 oz) IBW/kg (Calculated) : 63.9 TPN AdjBW (KG): 46 Body mass index is 18.27 kg/m.  Assessment: 67 year old female with severe malnutrition related to chronic illness. Patient with desquamating rash likely related to significant nutritional/vitamin deficiencies. Pharmacy consulted to initiate TPN to accelerate improvement in nutritional deficiencies.  Glucose / Insulin: no hx diabetes noted -CBGs 138-227 -24 hr SSI 4u Electrolytes: Na remains low/improved, others WNL -Corrected Ca okay in setting of significant hypoalbuminemia - Magnesium 2g IV per MD Renal: WNL Hepatic: last AST/ALT WNL (1/4), albumin low < 1.5 Intake / Output: MIVF d/c; PRBC transfusion and Lasix 20mg  x1 on 1/6 GI Imaging: NA GI Surgeries / Procedures: NA  Central access: 1/3 TPN start date: 1/3  Nutritional Goals: Goal TPN rate is 80 mL/hr (provides 100 g of protein and 1954 kcals per day)  RD Assessment: Estimated Needs Total Energy Estimated Needs: 1900-2100 Total Protein Estimated Needs: 95-105g Total Fluid Estimated Needs: 2L/day  Current Nutrition: regular diet Enteral supplements:  Ensure TID, Prosource BID  Plan:  - Continue TPN at goal rate 80 mL/hr at 1800 - Electrolytes in TPN: Na 100 mEq/L, K 40 mEq/L, Ca 5 mEq/L, Mg 5 mEq/L, and Phos 15 mmol/L. Cl:Ac 1:1 - Add standard MVI and trace elements to TPN (provides 60 mcg selenium, adequate per discussion with RD) - Add thiamine 683 mg and folic acid 1 mg to TPN - Continue CBG checks and sensitive SSI q8h  - Monitor TPN labs on Mon/Thurs and PRN    Gretta Arab PharmD, BCPS [facility] main pharmacy [phone] 03/20/2022 9:36 AM

## 2022-03-20 NOTE — Progress Notes (Signed)
PROGRESS NOTE    Carolyn [name]  DDU:[id number] DOB: 08-26-1955 DOA: 03/13/2022 PCP: Pcp, No    Chief Complaint  Patient presents with   Skin Problem    Brief Narrative:  HPI This is a 67 year old female, past medical history of chronic pancreatitis, history of prior alcoholism, gastroesophageal reflux, prior vitamin D deficiency documented. She has known malabsorption issues and has been on Creon. She presents with cellulitis and sepsis related to a ongoing rash of the hands and feet. It appears that the rash has been going on for several weeks continued to get worse. As started off with a small blister the blister then ruptured the skin started to dry out crack and flake. She is starting to flake and desquamate portions of the hands and feet. She developed increased pain and swelling in the right hand that developed redness that tracks into the forearm. She presents to the ER with an elevated white count and was treated with antibiotics for concern of sepsis.   Assessment & Plan:   Principal Problem:   Severe sepsis (HCC) Active Problems:   Protein-calorie malnutrition, severe (HCC)   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HCC)   Chronic gastric ulcer with perforation (HCC)   Desquamative dermatitis   Sepsis (HCC)   Cellulitis of left lower extremity   Hypophosphatemia   Anemia of chronic disease   Localized swelling of right upper extremity   NSVT (nonsustained ventricular tachycardia) (HCC)   #1 severe sepsis secondary to discriminative dermatitis with superimposed cellulitis and possible underlying osteomyelitis -Patient noted to have presented with complicated nonhealing desquamative dermatitis involving hands and feet complicated by severe sepsis due to cellulitis/wound infection. -MRI of bilateral hands and feet done without any definite evidence of osteomyelitis. -Patient initially admitted to the critical care service, concern for probable vitamin/nutritional deficiencies  versus paraneoplastic syndromes. -Concern for possible nutritional deficiencies, B2, B6, tryptophan, zinc, thiamine etc. could lead to this. -Patient noted to be prior alcoholic now with a chronic pancreatitis on Creon and likely has pre-existing condition of malabsorption as well as possible gastric surgery unsure as to what the patient has had this on not however per PCCM. -Patient placed empirically on IV vancomycin, IV Rocephin which we will continue, patient seen by ID recommending transition to doxycycline and Augmentin to complete a 10-day course of antibiotics on discharge. -Transitioned to oral Augmentin and doxycycline 03/19/2022 to complete course of antibiotic treatment. -Continue vitamin D3, vitamin B12, zinc, vitamin D3. -Continue vitamin A, selenium. -Will need follow-up with dermatology (we have followed in [place]) -Will need outpatient follow-up with podiatry for bone biopsy to evaluate for osteomyelitis. -Outpatient follow-up with ID which has been set for 03/22/2022. -Patient unable to ambulate seen by PT would likely require SNF placement.  2.  History of chronic alcoholic pancreatitis -Continue Creon.   3.  Hepatic cirrhosis -Being followed in the outpatient setting by [facility]. -BP soft and as such diuretics held spironolactone and Lasix held. -Patient with no hepatic encephalopathy, no evidence of abdominal ascites. -Outpatient follow-up with GI.  4.  Gastric chronic ulcer with contained perforation -Stable. -PPI twice daily, Pepcid twice daily.  5.  Hypothyroidism -TSH noted at 33.958 on 03/15/2022. -Increased Synthroid to 50 mcg p.o. q. daily.  6.  Hypophosphatemia/hypomagnesemia -Magnesium at 1.7 this morning.   -Phosphorus at 2.9.   -Magnesium sulfate 2 g IV x 1. -Repletion per pharmacy.   -Repeat labs in the AM.    7.  Severe protein calorie malnutrition -Patient seen  by dietitian, PICC line placed and patient started on TPN per  pharmacy. -Albumin level noted at< 1.5 -Status post IV albumin x 1.  8.  Anemia of chronic disease/folate deficiency -Hemoglobin noted to go as low as 6.5, status post transfusion 1 unit packed red blood cells.   Hemoglobin currently at 7.1 today. -Patient with a 7 beat run of NSVT yesterday evening, transfused 2 units packed red blood cells to keep hemoglobin approximately above 8.  9.  Right upper extremity swelling and pain -Improved clinically.   -Right upper extremity Dopplers negative for DVT.  10.  NSVT -Patient noted to have 7 beat run of NSVT yesterday evening however remained asymptomatic. -Electrolytes repleted potassium today currently at 4.5, magnesium at 1.7. -Magnesium sulfate 2 g IV x 1. -Hemoglobin noted at 7.1 will transfuse 2 units packed red blood cells to keep hemoglobin above 8. -Follow-up.   DVT prophylaxis: Heparin Code Status: Full Family Communication: Updated patient.  Updated significant other at bedside yesterday.  No family at bedside today.   Disposition: SNF.  Status is: Inpatient Remains inpatient appropriate because: Severity of illness.  Unsafe disposition.   Consultants:  PCCM admission. Infectious disease: Dr. Thedore Mins 03/15/2022 Wound care RN 03/14/2022  Procedures:  Plain films of bilateral feet 03/13/2022 Chest x-ray 03/15/2022 MRI left foot and right foot 03/13/2022 PICC line placement 03/17/2022 Transfused 2 units packed red blood cells pending 03/20/2022 Transfuse 1 unit packed red blood cells 03/15/2022 Right upper extremity Dopplers 03/19/2022  Antimicrobials:  Anti-infectives (From admission, onward)    Start     Dose/Rate Route Frequency Ordered Stop   03/19/22 2200  amoxicillin-clavulanate (AUGMENTIN) 875-125 MG per tablet 1 tablet        1 tablet Oral Every 12 hours 03/19/22 1115     03/19/22 2200  doxycycline (VIBRA-TABS) tablet 100 mg        100 mg Oral Every 12 hours 03/19/22 1115     03/14/22 2200  vancomycin (VANCOREADY) IVPB  500 mg/100 mL  Status:  Discontinued        500 mg 100 mL/hr over 60 Minutes Intravenous Every 24 hours 03/13/22 2049 03/19/22 1116   03/13/22 1915  vancomycin (VANCOCIN) IVPB 1000 mg/200 mL premix        1,000 mg 200 mL/hr over 60 Minutes Intravenous  Once 03/13/22 1908 03/14/22 0103   03/13/22 1615  cefTRIAXone (ROCEPHIN) 2 g in sodium chloride 0.9 % 100 mL IVPB        2 g 200 mL/hr over 30 Minutes Intravenous Every 24 hours 03/13/22 1603 03/19/22 1959         Subjective: Sleeping, easily arousable.  Right upper extremity pain with some improvement in current pain regimen.  Swelling the right upper extremity improving.  Tolerating a diet.  Denies any chest pain.  No shortness of breath.  No overt bleeding noted.  Objective: Vitals:   03/20/22 0447 03/20/22 0620 03/20/22 0641 03/20/22 1106  BP: 133/83   (!) 150/97  Pulse: (!) 130 (!) 127 (!) 120 (!) 129  Resp:    18  Temp: 98.7 F (37.1 C)   99.1 F (37.3 C)  TempSrc: Oral   Oral  SpO2: 98% 96% 95% 96%  Weight: 54.5 kg     Height:        Intake/Output Summary (Last 24 hours) at 03/20/2022 1249 Last data filed at 03/20/2022 0900 Gross per 24 hour  Intake 2333.62 ml  Output 601 ml  Net 1732.62 ml  Filed Weights   03/18/22 0526 03/19/22 0500 03/20/22 0447  Weight: 47 kg 51.5 kg 54.5 kg    Examination:  General exam: Emaciated.  Cachectic.  Frail.  Pallor.  Respiratory system: CTAB.  No wheezes, no crackles, no rhonchi.  Fair air movement.  Speaking in full sentences.  No use of accessory muscles of respiration.   Cardiovascular system: Regular rate rhythm no murmurs rubs or gallops.  No JVD.  No lower extremity edema.  Gastrointestinal system: Abdomen is soft, nontender, nondistended, positive bowel sounds.  No rebound.  No guarding.   Central nervous system: Alert and oriented.  Moving extremities spontaneously.  No focal neurological deficits. Extremities: Bilateral hands, bilateral feet wrapped in gauze/Kerlix.   Right upper extremity with decreased swelling and decreased tenderness to palpation.  Skin: No rashes, lesions or ulcers Psychiatry: Judgement and insight appear normal. Mood & affect appropriate.     Data Reviewed: I have personally reviewed following labs and imaging studies  CBC: Recent Labs  Lab 03/14/22 1625 03/14/22 2304 03/16/22 0952 03/17/22 0920 03/18/22 1209 03/18/22 1508 03/19/22 1200 03/20/22 0409  WBC 16.9*   < > 10.2 16.0* 12.7*  --  14.9* 11.6*  NEUTROABS 14.6*  --   --  13.2* 10.4*  --  12.3* 9.5*  HGB 7.0*   < > 10.9* 9.3* 6.8* 7.3* 7.5* 7.1*  HCT 21.2*   < > 36.8 28.1* 20.9* 22.6* 23.0* 22.5*  MCV 96.8   < > 103.7* 94.0 95.9  --  96.2 98.3  PLT 164   < > 124* 145* 127*  --  133* 126*   < > = values in this interval not displayed.     Basic Metabolic Panel: Recent Labs  Lab 03/15/22 0523 03/16/22 0952 03/17/22 0920 03/18/22 0411 03/19/22 0830 03/20/22 0409  NA 132* 133* 131* 128* 130* 133*  K 4.2 4.1 4.1 3.7 4.3 4.5  CL 102 103 102 101 101 103  CO2 24 22 22 23 25 25   GLUCOSE 134* 182* 152* 199* 168* 228*  BUN 22 20 16 17 19 22   CREATININE 1.00 0.91 0.72 0.65 0.64 0.61  CALCIUM 7.2* 7.5* 7.1* 6.5* 7.2* 7.1*  MG 1.8  --  1.4* 2.8* 1.9 1.7  PHOS 2.6  --  1.8* 2.0* 2.7 2.9     GFR: Estimated Creatinine Clearance: 61.1 mL/min (by C-G formula based on SCr of 0.61 mg/dL).  Liver Function Tests: Recent Labs  Lab 03/18/22 0411  AST 29  ALT 20  ALKPHOS 182*  BILITOT 0.5  PROT 4.4*  ALBUMIN <1.5*     CBG: Recent Labs  Lab 03/19/22 0754 03/19/22 1228 03/19/22 1649 03/20/22 0002 03/20/22 0726  GLUCAP 144* 138* 165* 190* 227*      Recent Results (from the past 240 hour(s))  Blood culture (routine x 2)     Status: None   Collection Time: 03/13/22  3:20 PM   Specimen: BLOOD  Result Value Ref Range Status   Specimen Description   Final    BLOOD BLOOD LEFT ARM Performed at [facility] [address]  [place], [place] [address]    Special Requests   Final    BOTTLES DRAWN AEROBIC AND ANAEROBIC Blood Culture adequate volume Performed at [facility] [address] [place], [place] [address]    Culture   Final    NO GROWTH 5 DAYS Performed at [facility], [place] [address] [place], [place] [address]  Report Status 03/18/2022 FINAL  Final  Blood culture (routine x 2)     Status: None   Collection Time: 03/13/22  3:20 PM   Specimen: BLOOD  Result Value Ref Range Status   Specimen Description   Final    BLOOD BLOOD RIGHT ARM Performed at [facility], [address], [place], [place] [address]    Special Requests   Final    BOTTLES DRAWN AEROBIC AND ANAEROBIC Blood Culture results may not be optimal due to an excessive volume of blood received in culture bottles Performed at [facility], [address], [place], [place] [address]    Culture   Final    NO GROWTH 5 DAYS Performed at [facility], [address] [place], [place] [address]    Report Status 03/18/2022 FINAL  Final         Radiology Studies: VAS [place] UPPER EXTREMITY VENOUS DUPLEX  Result Date: 03/19/2022 UPPER VENOUS STUDY  Patient Name:  DANAHI REDDISH  Date of Exam:   03/19/2022 Medical Rec #: [id number]       Accession #:    [id number] Date of Birth: April 21, 1955       Patient Gender: F Patient Age:   73 years Exam Location:  [facility] Procedure:      VAS [place] UPPER EXTREMITY VENOUS DUPLEX Referring Phys: Ramiro Harvest --------------------------------------------------------------------------------  Indications: Swelling Risk Factors: None identified. Limitations: Poor ultrasound/tissue interface, bandages and open wound. Comparison Study: No prior studies. Performing Technologist: Chanda Busing RVT  Examination Guidelines: A complete evaluation includes B-mode imaging, spectral Doppler, color Doppler, and power Doppler as needed of all accessible  portions of each vessel. Bilateral testing is considered an integral part of a complete examination. Limited examinations for reoccurring indications may be performed as noted.  Right Findings: +----------+------------+---------+-----------+----------+-------+ RIGHT     CompressiblePhasicitySpontaneousPropertiesSummary +----------+------------+---------+-----------+----------+-------+ IJV           Full       Yes       Yes                      +----------+------------+---------+-----------+----------+-------+ Subclavian    Full       Yes       Yes                      +----------+------------+---------+-----------+----------+-------+ Axillary      Full       Yes       Yes                      +----------+------------+---------+-----------+----------+-------+ Brachial      Full       Yes       Yes                      +----------+------------+---------+-----------+----------+-------+ Radial        Full                                          +----------+------------+---------+-----------+----------+-------+ Ulnar         Full                                          +----------+------------+---------+-----------+----------+-------+  Cephalic      Full                                          +----------+------------+---------+-----------+----------+-------+ Basilic       Full                                          +----------+------------+---------+-----------+----------+-------+  Left Findings: +----------+------------+---------+-----------+----------+-------+ LEFT      CompressiblePhasicitySpontaneousPropertiesSummary +----------+------------+---------+-----------+----------+-------+ Subclavian    Full       Yes       Yes                      +----------+------------+---------+-----------+----------+-------+  Summary:  Right: No evidence of deep vein thrombosis in the upper extremity. No evidence of superficial vein thrombosis in the  upper extremity.  Left: No evidence of thrombosis in the subclavian.  *See table(s) above for measurements and observations.    Preliminary         Scheduled Meds:  (feeding supplement) PROSource Plus  30 mL Oral BID BM   amoxicillin-clavulanate  1 tablet Oral Q12H   Chlorhexidine Gluconate Cloth  6 each Topical Daily   cholecalciferol  1,000 Units Oral Daily   doxycycline  100 mg Oral Q12H   enoxaparin (LOVENOX) injection  30 mg Subcutaneous Q24H   famotidine  10 mg Oral BID   feeding supplement  237 mL Oral TID BM   insulin aspart  0-9 Units Subcutaneous Q8H   levothyroxine  50 mcg Oral Q0600   lipase/protease/amylase  24,000 Units Oral TID WC   mupirocin cream   Topical Once   pantoprazole  40 mg Oral BID AC   pyridOXINE  100 mg Oral Daily   silver sulfADIAZINE   Topical Daily   sodium chloride flush  10-40 mL Intracatheter Q12H   vitamin B-12  100 mcg Oral Daily   vitamin E  400 Units Oral Daily   zinc sulfate  220 mg Oral Daily   Continuous Infusions:  TPN ADULT (ION) 80 mL/hr at 03/19/22 1916   TPN ADULT (ION)       LOS: 6 days    Time spent: 35 minutes    Ramiro Harvest, MD [facility]   To contact the attending provider between 7A-7P or the covering provider during after hours 7P-7A, please log into the web site [email or web address] and access using universal [place] password for that web site. If you do not have the password, please call the hospital operator.  03/20/2022, 12:49 PM

## 2022-03-21 ENCOUNTER — Other Ambulatory Visit: Payer: Self-pay | Admitting: Gastroenterology

## 2022-03-21 ENCOUNTER — Encounter: Payer: Self-pay | Admitting: Gastroenterology

## 2022-03-21 DIAGNOSIS — A419 Sepsis, unspecified organism: Secondary | ICD-10-CM | POA: Diagnosis not present

## 2022-03-21 DIAGNOSIS — K860 Alcohol-induced chronic pancreatitis: Secondary | ICD-10-CM | POA: Diagnosis not present

## 2022-03-21 DIAGNOSIS — L03116 Cellulitis of left lower limb: Secondary | ICD-10-CM | POA: Diagnosis not present

## 2022-03-21 DIAGNOSIS — K255 Chronic or unspecified gastric ulcer with perforation: Secondary | ICD-10-CM | POA: Diagnosis not present

## 2022-03-21 LAB — MISC LABCORP TEST (SEND OUT): Labcorp test code: 70115

## 2022-03-21 LAB — GLUCOSE, CAPILLARY
Glucose-Capillary: 203 mg/dL — ABNORMAL HIGH (ref 70–99)
Glucose-Capillary: 236 mg/dL — ABNORMAL HIGH (ref 70–99)
Glucose-Capillary: 313 mg/dL — ABNORMAL HIGH (ref 70–99)
Glucose-Capillary: 332 mg/dL — ABNORMAL HIGH (ref 70–99)

## 2022-03-21 LAB — BASIC METABOLIC PANEL
Anion gap: 4 — ABNORMAL LOW (ref 5–15)
BUN: 23 mg/dL (ref 8–23)
CO2: 25 mmol/L (ref 22–32)
Calcium: 7.2 mg/dL — ABNORMAL LOW (ref 8.9–10.3)
Chloride: 105 mmol/L (ref 98–111)
Creatinine, Ser: 0.59 mg/dL (ref 0.44–1.00)
GFR, Estimated: >60 mL/min (ref >60)
Glucose, Bld: 268 mg/dL — ABNORMAL HIGH (ref 70–99)
Potassium: 4.5 mmol/L (ref 3.5–5.1)
Sodium: 134 mmol/L — ABNORMAL LOW (ref 135–145)

## 2022-03-21 LAB — MAGNESIUM: Magnesium: 2 mg/dL (ref 1.7–2.4)

## 2022-03-21 LAB — CBC
HCT: 30.5 % — ABNORMAL LOW (ref 36.0–46.0)
Hemoglobin: 10 g/dL — ABNORMAL LOW (ref 12.0–15.0)
MCH: 31.3 pg (ref 26.0–34.0)
MCHC: 32.8 g/dL (ref 30.0–36.0)
MCV: 95.3 fL (ref 80.0–100.0)
Platelets: 131 10*3/uL — ABNORMAL LOW (ref 150–400)
RBC: 3.2 MIL/uL — ABNORMAL LOW (ref 3.87–5.11)
RDW: 15.4 % (ref 11.5–15.5)
WBC: 12.6 10*3/uL — ABNORMAL HIGH (ref 4.0–10.5)
nRBC: 0.3 % — ABNORMAL HIGH (ref 0.0–0.2)

## 2022-03-21 MED ORDER — OXYCODONE HCL 5 MG PO TABS
10.0000 mg | ORAL_TABLET | ORAL | Status: DC | PRN
  Administered 2022-03-21 – 2022-03-29 (×17): 10 mg via ORAL
  Filled 2022-03-21 (×18): qty 2

## 2022-03-21 MED ORDER — INSULIN ASPART 100 UNIT/ML IJ SOLN
0.0000 [IU] | Freq: Three times a day (TID) | INTRAMUSCULAR | Status: AC
  Administered 2022-03-21: 11 [IU] via SUBCUTANEOUS
  Administered 2022-03-22: 5 [IU] via SUBCUTANEOUS
  Administered 2022-03-22: 11 [IU] via SUBCUTANEOUS

## 2022-03-21 MED ORDER — GABAPENTIN 300 MG PO CAPS
300.0000 mg | ORAL_CAPSULE | Freq: Three times a day (TID) | ORAL | Status: DC
  Administered 2022-03-21 – 2022-04-09 (×57): 300 mg via ORAL
  Filled 2022-03-21 (×58): qty 1

## 2022-03-21 MED ORDER — HYDROMORPHONE HCL 1 MG/ML IJ SOLN
2.0000 mg | INTRAMUSCULAR | Status: DC | PRN
  Administered 2022-03-21 – 2022-03-23 (×10): 2 mg via INTRAVENOUS
  Filled 2022-03-21 (×14): qty 2

## 2022-03-21 MED ORDER — METOPROLOL TARTRATE 5 MG/5ML IV SOLN
5.0000 mg | Freq: Once | INTRAVENOUS | Status: AC
  Administered 2022-03-21: 5 mg via INTRAVENOUS
  Filled 2022-03-21: qty 5

## 2022-03-21 MED ORDER — ACETAMINOPHEN 500 MG PO TABS
1000.0000 mg | ORAL_TABLET | Freq: Three times a day (TID) | ORAL | Status: DC
  Administered 2022-03-21 – 2022-04-12 (×61): 1000 mg via ORAL
  Filled 2022-03-21 (×65): qty 2

## 2022-03-21 MED ORDER — HYDROMORPHONE HCL 1 MG/ML IJ SOLN: 1.0000 mg | INTRAMUSCULAR | Status: DC | PRN

## 2022-03-21 MED ORDER — TRAVASOL 10 % IV SOLN
INTRAVENOUS | Status: AC
  Filled 2022-03-21: qty 998.4

## 2022-03-21 NOTE — Progress Notes (Signed)
PROGRESS NOTE    Carolyn [name]  UXN:[id number] DOB: 19-May-1955 DOA: 03/13/2022 PCP: Pcp, No    Chief Complaint  Patient presents with   Skin Problem    Brief Narrative:  HPI This is a 67 year old female, past medical history of chronic pancreatitis, history of prior alcoholism, gastroesophageal reflux, prior vitamin D deficiency documented. She has known malabsorption issues and has been on Creon. She presents with cellulitis and sepsis related to a ongoing rash of the hands and feet. It appears that the rash has been going on for several weeks continued to get worse. As started off with a small blister the blister then ruptured the skin started to dry out crack and flake. She is starting to flake and desquamate portions of the hands and feet. She developed increased pain and swelling in the right hand that developed redness that tracks into the forearm. She presents to the ER with an elevated white count and was treated with antibiotics for concern of sepsis.   Assessment & Plan:   Principal Problem:   Severe sepsis (HCC) Active Problems:   Protein-calorie malnutrition, severe (HCC)   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HCC)   Chronic gastric ulcer with perforation (HCC)   Desquamative dermatitis   Sepsis (HCC)   Cellulitis of left lower extremity   Hypophosphatemia   Anemia of chronic disease   Localized swelling of right upper extremity   NSVT (nonsustained ventricular tachycardia) (HCC)   #1 severe sepsis secondary to discriminative dermatitis with superimposed cellulitis and possible underlying osteomyelitis -Patient noted to have presented with complicated nonhealing desquamative dermatitis involving hands and feet complicated by severe sepsis due to cellulitis/wound infection. -MRI of bilateral hands and feet done without any definite evidence of osteomyelitis. -Patient initially admitted to the critical care service, concern for probable vitamin/nutritional deficiencies  versus paraneoplastic syndromes. -Concern for possible nutritional deficiencies, B2, B6, tryptophan, zinc, thiamine etc. could lead to this. -Patient noted to be prior alcoholic now with a chronic pancreatitis on Creon and likely has pre-existing condition of malabsorption as well as possible gastric surgery unsure as to what the patient has had this on not however per PCCM. -Patient placed empirically on IV vancomycin, IV Rocephin which we will continue, patient seen by ID recommending transition to doxycycline and Augmentin to complete a 10-day course of antibiotics on discharge. -Transitioned to oral Augmentin and doxycycline 03/19/2022 to complete course of antibiotic treatment. -Continue vitamin D3, vitamin B12, zinc, vitamin D3. -Continue vitamin A, selenium. -Will need follow-up with dermatology (we have followed in [place]) -Will need outpatient follow-up with podiatry for bone biopsy to evaluate for osteomyelitis. -Outpatient follow-up with ID which has been set for 03/22/2022. -Patient unable to ambulate seen by PT would likely require SNF placement. -Patient with ongoing pain, causing tachycardia, continue current regimen and IV Dilaudid as needed.  Placed on Neurontin 300 mg 3 times daily.  Placed on Tylenol 1000 mg 3 times daily.  May need long-acting pain medications. -Supportive care.  2.  History of chronic alcoholic pancreatitis -Continue Creon.   3.  Hepatic cirrhosis -Being followed in the outpatient setting by [facility]. -BP soft and as such diuretics held spironolactone and Lasix held. -Patient with no hepatic encephalopathy, no evidence of abdominal ascites. -Outpatient follow-up with GI.  4.  Gastric chronic ulcer with contained perforation -Stable. -PPI twice daily, Pepcid twice daily.  5.  Hypothyroidism -TSH noted at 33.958 on 03/15/2022. -Continue Synthroid 50 mcg's daily.   -Will need repeat labs in  4 to 6 weeks in the outpatient setting.     6.  Hypophosphatemia/hypomagnesemia -Magnesium at 2 this morning.   -Phosphorus at 2.9.  -Repletion per pharmacy.   -Repeat labs in the AM.    7.  Severe protein calorie malnutrition -Patient seen by dietitian, PICC line placed and patient started on TPN per pharmacy. -Albumin level noted at< 1.5 -Status post IV albumin x 1.  8.  Anemia of chronic disease/folate deficiency -Hemoglobin noted to go as low as 6.5, status post transfusion 1 unit packed red blood cells.   Hemoglobin noted at 7.1 on 03/20/2022.  Status posttransfusion 2 units packed red blood cells hemoglobin currently at 10.   9.  Right upper extremity swelling and pain -Clinical improvement.   -RUE Dopplers negative for DVT.  10.  NSVT -Patient noted to have 7 beat run of NSVT on 03/19/2022, patient was asymptomatic.   -Electrolytes repleted potassium today currently at 4.5, magnesium at 2. -Hemoglobin noted at 7.1 on 03/20/2022, status post transfusion 2 units packed red blood cells hemoglobin currently at 10.  -Repeat labs in the AM.     DVT prophylaxis: Heparin Code Status: Full Family Communication: Updated patient.  No family at bedside.  Disposition: SNF.  Status is: Inpatient Remains inpatient appropriate because: Severity of illness.  Unsafe disposition.   Consultants:  PCCM admission. Infectious disease: Dr. Thedore Mins 03/15/2022 Wound care RN 03/14/2022  Procedures:  Plain films of bilateral feet 03/13/2022 Chest x-ray 03/15/2022 MRI left foot and right foot 03/13/2022 PICC line placement 03/17/2022 Transfused 2 units packed red blood cells 03/20/2022 Transfuse 1 unit packed red blood cells 03/15/2022 Right upper extremity Dopplers 03/19/2022  Antimicrobials:  Anti-infectives (From admission, onward)    Start     Dose/Rate Route Frequency Ordered Stop   03/19/22 2200  amoxicillin-clavulanate (AUGMENTIN) 875-125 MG per tablet 1 tablet        1 tablet Oral Every 12 hours 03/19/22 1115     03/19/22 2200   doxycycline (VIBRA-TABS) tablet 100 mg        100 mg Oral Every 12 hours 03/19/22 1115     03/14/22 2200  vancomycin (VANCOREADY) IVPB 500 mg/100 mL  Status:  Discontinued        500 mg 100 mL/hr over 60 Minutes Intravenous Every 24 hours 03/13/22 2049 03/19/22 1116   03/13/22 1915  vancomycin (VANCOCIN) IVPB 1000 mg/200 mL premix        1,000 mg 200 mL/hr over 60 Minutes Intravenous  Once 03/13/22 1908 03/14/22 0103   03/13/22 1615  cefTRIAXone (ROCEPHIN) 2 g in sodium chloride 0.9 % 100 mL IVPB        2 g 200 mL/hr over 30 Minutes Intravenous Every 24 hours 03/13/22 1603 03/19/22 1959         Subjective: Laying in bed.  Denies any chest pain or shortness of breath.  Still with complaints of pain in the hands and feet.  Tolerating diet.    Objective: Vitals:   03/20/22 2113 03/20/22 2328 03/21/22 0458 03/21/22 0500  BP: 100/61 116/73 (!) 143/97   Pulse: 84 87 (!) 110   Resp: 18 18 20    Temp: 98.1 F (36.7 C) 99.7 F (37.6 C) 98.6 F (37 C)   TempSrc: Oral Oral Oral   SpO2: 96% 95% 93%   Weight:    55.6 kg  Height:        Intake/Output Summary (Last 24 hours) at 03/21/2022 1206 Last data filed at 03/21/2022 05/20/2022 Gross  per 24 hour  Intake 1551.87 ml  Output 700 ml  Net 851.87 ml    Filed Weights   03/19/22 0500 03/20/22 0447 03/21/22 0500  Weight: 51.5 kg 54.5 kg 55.6 kg    Examination:  General exam: Emaciated, cachectic, frail.  Respiratory system: Lungs clear to auscultation bilaterally.  No wheezes, no crackles, no rhonchi.  Fair air movement.  No use of accessory muscles of respiration.  Cardiovascular system: Tachycardia.  No murmurs rubs or gallops.  No JVD.  No lower extremity edema. Gastrointestinal system: Abdomen is soft, nontender, nondistended, positive bowel sounds.  No rebound.  No guarding.   Central nervous system: Alert and oriented.  Moving extremities spontaneously.  No focal neurological deficits.  Extremities: Bilateral hands, bilateral feet  wrapped in gauze/Kerlix.  Right upper extremity with decreased swelling and decreased tenderness to palpation.  Skin: No rashes, lesions or ulcers Psychiatry: Judgement and insight appear normal. Mood & affect appropriate.     Data Reviewed: I have personally reviewed following labs and imaging studies  CBC: Recent Labs  Lab 03/14/22 1625 03/14/22 2304 03/17/22 0920 03/18/22 1209 03/18/22 1508 03/19/22 1200 03/20/22 0409 03/21/22 0309  WBC 16.9*   < > 16.0* 12.7*  --  14.9* 11.6* 12.6*  NEUTROABS 14.6*  --  13.2* 10.4*  --  12.3* 9.5*  --   HGB 7.0*   < > 9.3* 6.8* 7.3* 7.5* 7.1* 10.0*  HCT 21.2*   < > 28.1* 20.9* 22.6* 23.0* 22.5* 30.5*  MCV 96.8   < > 94.0 95.9  --  96.2 98.3 95.3  PLT 164   < > 145* 127*  --  133* 126* 131*   < > = values in this interval not displayed.     Basic Metabolic Panel: Recent Labs  Lab 03/15/22 0523 03/16/22 0952 03/17/22 0920 03/18/22 0411 03/19/22 0830 03/20/22 0409 03/21/22 0309  NA 132*   < > 131* 128* 130* 133* 134*  K 4.2   < > 4.1 3.7 4.3 4.5 4.5  CL 102   < > 102 101 101 103 105  CO2 24   < > 22 23 25 25 25   GLUCOSE 134*   < > 152* 199* 168* 228* 268*  BUN 22   < > 16 17 19 22 23   CREATININE 1.00   < > 0.72 0.65 0.64 0.61 0.59  CALCIUM 7.2*   < > 7.1* 6.5* 7.2* 7.1* 7.2*  MG 1.8  --  1.4* 2.8* 1.9 1.7 2.0  PHOS 2.6  --  1.8* 2.0* 2.7 2.9  --    < > = values in this interval not displayed.     GFR: Estimated Creatinine Clearance: 62.4 mL/min (by C-G formula based on SCr of 0.59 mg/dL).  Liver Function Tests: Recent Labs  Lab 03/18/22 0411  AST 29  ALT 20  ALKPHOS 182*  BILITOT 0.5  PROT 4.4*  ALBUMIN <1.5*     CBG: Recent Labs  Lab 03/20/22 0002 03/20/22 0726 03/20/22 1605 03/21/22 0022 03/21/22 0830  GLUCAP 190* 227* 197* 203* 236*      Recent Results (from the past 240 hour(s))  Blood culture (routine x 2)     Status: None   Collection Time: 03/13/22  3:20 PM   Specimen: BLOOD  Result Value  Ref Range Status   Specimen Description   Final    BLOOD BLOOD LEFT ARM Performed at [facility], [address] [place], [place]  Special Requests   Final    BOTTLES DRAWN AEROBIC AND ANAEROBIC Blood Culture adequate volume Performed at [facility] [address], [place], [place] [address]    Culture   Final    NO GROWTH 5 DAYS Performed at [facility], [place] [address], [place], [place] [address]    Report Status 03/18/2022 FINAL  Final  Blood culture (routine x 2)     Status: None   Collection Time: 03/13/22  3:20 PM   Specimen: BLOOD  Result Value Ref Range Status   Specimen Description   Final    BLOOD BLOOD RIGHT ARM Performed at [facility] [address] [place], [place] [address]    Special Requests   Final    BOTTLES DRAWN AEROBIC AND ANAEROBIC Blood Culture results may not be optimal due to an excessive volume of blood received in culture bottles Performed at [facility] [address], [place], [place] [address]    Culture   Final    NO GROWTH 5 DAYS Performed at [facility], [place] [address] [place], [place] [address]    Report Status 03/18/2022 FINAL  Final         Radiology Studies: VAS [place] UPPER EXTREMITY VENOUS DUPLEX  Result Date: 03/20/2022 UPPER VENOUS STUDY  Patient Name:  ZENIAH BRINEY  Date of Exam:   03/19/2022 Medical Rec #: [id number]       Accession #:    [id number] Date of Birth: May 05, 1955       Patient Gender: F Patient Age:   40 years Exam Location:  [facility] Procedure:      VAS [place] UPPER EXTREMITY VENOUS DUPLEX Referring Phys: Irine Seal --------------------------------------------------------------------------------  Indications: Swelling Risk Factors: None identified. Limitations: Poor ultrasound/tissue interface, bandages and open wound. Comparison Study: No prior studies. Performing Technologist: Oliver Hum RVT   Examination Guidelines: A complete evaluation includes B-mode imaging, spectral Doppler, color Doppler, and power Doppler as needed of all accessible portions of each vessel. Bilateral testing is considered an integral part of a complete examination. Limited examinations for reoccurring indications may be performed as noted.  Right Findings: +----------+------------+---------+-----------+----------+-------+ RIGHT     CompressiblePhasicitySpontaneousPropertiesSummary +----------+------------+---------+-----------+----------+-------+ IJV           Full       Yes       Yes                      +----------+------------+---------+-----------+----------+-------+ Subclavian    Full       Yes       Yes                      +----------+------------+---------+-----------+----------+-------+ Axillary      Full       Yes       Yes                      +----------+------------+---------+-----------+----------+-------+ Brachial      Full       Yes       Yes                      +----------+------------+---------+-----------+----------+-------+ Radial        Full                                          +----------+------------+---------+-----------+----------+-------+  Ulnar         Full                                          +----------+------------+---------+-----------+----------+-------+ Cephalic      Full                                          +----------+------------+---------+-----------+----------+-------+ Basilic       Full                                          +----------+------------+---------+-----------+----------+-------+  Left Findings: +----------+------------+---------+-----------+----------+-------+ LEFT      CompressiblePhasicitySpontaneousPropertiesSummary +----------+------------+---------+-----------+----------+-------+ Subclavian    Full       Yes       Yes                       +----------+------------+---------+-----------+----------+-------+  Summary:  Right: No evidence of deep vein thrombosis in the upper extremity. No evidence of superficial vein thrombosis in the upper extremity.  Left: No evidence of thrombosis in the subclavian.  *See table(s) above for measurements and observations.  Diagnosing physician: Gerarda Fraction Electronically signed by Gerarda Fraction on 03/20/2022 at 1:13:44 PM.    Final         Scheduled Meds:  (feeding supplement) PROSource Plus  30 mL Oral BID BM   amoxicillin-clavulanate  1 tablet Oral Q12H   Chlorhexidine Gluconate Cloth  6 each Topical Daily   cholecalciferol  1,000 Units Oral Daily   doxycycline  100 mg Oral Q12H   enoxaparin (LOVENOX) injection  30 mg Subcutaneous Q24H   famotidine  10 mg Oral BID   feeding supplement  237 mL Oral TID BM   gabapentin  300 mg Oral TID   insulin aspart  0-15 Units Subcutaneous Q8H   levothyroxine  50 mcg Oral Q0600   lipase/protease/amylase  24,000 Units Oral TID WC   mupirocin cream   Topical Once   pantoprazole  40 mg Oral BID AC   pyridOXINE  100 mg Oral Daily   silver sulfADIAZINE   Topical Daily   sodium chloride flush  10-40 mL Intracatheter Q12H   vitamin B-12  100 mcg Oral Daily   vitamin E  400 Units Oral Daily   zinc sulfate  220 mg Oral Daily   Continuous Infusions:  TPN ADULT (ION) 80 mL/hr at 03/20/22 1717   TPN ADULT (ION)       LOS: 7 days    Time spent: 35 minutes    Ramiro Harvest, MD [facility]   To contact the attending provider between 7A-7P or the covering provider during after hours 7P-7A, please log into the web site [email or web address] and access using universal [place] password for that web site. If you do not have the password, please call the hospital operator.  03/21/2022, 12:06 PM

## 2022-03-21 NOTE — Progress Notes (Signed)
PHARMACY - TOTAL PARENTERAL NUTRITION CONSULT NOTE   Indication:  chronic severe malnutrition with desquamating rash  Patient Measurements: Height: 5\' 8"  (172.7 cm) Weight: 55.6 kg (122 lb 9.2 oz) IBW/kg (Calculated) : 63.9 TPN AdjBW (KG): 46 Body mass index is 18.64 kg/m.  Assessment: 67 year old female with severe malnutrition related to chronic illness. Patient with desquamating rash likely related to significant nutritional/vitamin deficiencies. Pharmacy consulted to initiate TPN to accelerate improvement in nutritional deficiencies.  Glucose / Insulin: no hx diabetes noted - CBGs 190-236 - sSSI:  3 units / 24 hr Electrolytes: Na remains low/improved, others WNL -Corrected Ca okay in setting of significant hypoalbuminemia Renal: WNL Hepatic: last AST/ALT WNL (1/4), albumin low < 1.5 Intake / Output: I/O not fully charted - UOP x3, stool x3 - MIVF d/c; PRBC transfusion and Lasix 20mg  x1 on 1/6 GI Imaging: NA GI Surgeries / Procedures: NA  Central access: 1/3 TPN start date: 1/3  Nutritional Goals: Goal TPN rate is 80 mL/hr (provides 100 g of protein and 1954 kcals per day)  RD Assessment: Estimated Needs Total Energy Estimated Needs: 1900-2100 Total Protein Estimated Needs: 95-105g Total Fluid Estimated Needs: 2L/day  Current Nutrition: regular diet Enteral supplements:  Ensure TID, Prosource BID  Plan:  - Continue TPN at goal rate 80 mL/hr at 1800 - Electrolytes in TPN: Na 100 mEq/L, K 40 mEq/L, Ca 5 mEq/L, Mg 5 mEq/L, and Phos 15 mmol/L. Cl:Ac 1:1 - Add standard MVI and trace elements to TPN (provides 60 mcg selenium, adequate per discussion with RD) - Add thiamine 102 mg and folic acid 1 mg to TPN - Continue CBG checks and increase to moderate SSI q8h  - Monitor TPN labs on Mon/Thurs and PRN  Gretta Arab PharmD, BCPS [facility] main pharmacy [phone] 03/21/2022 10:00 AM

## 2022-03-21 NOTE — Progress Notes (Signed)
Physical Therapy Treatment Patient Details Name: Carolyn [name] MRN: [id number] DOB: 1955-08-10 Today's Date: 03/21/2022   History of Present Illness 67 year old female with history of chronic pancreatitis, chronic pain syndrome, GERD, severe malnutrition, osteoporosis who presented to [facility] ED with LE "cellulitis due to breakdown from desquammating wounds" involving bil feet and to lesser extent hands.  Pt admitted 03/13/22 for severe sepsis secondary to discriminative dermatitis with superimposed cellulitis and possible underlying osteomyelitis    PT Comments    Pt agreeable to attempt sitting EOB (has not really been able to tolerate movement due to hand and foot pain).  Pt slowly assisted to sitting (feet not touching floor) and RN brought IV pain meds.  Pt able to perform exercises sitting EOB and rest feet on floor for first time in days.  Pt not yet prepared to attempt weight bearing.  Pt assisted back to bed and repositioned.  Continue to recommend SNF upon d/c.    Recommendations for follow up therapy are one component of a multi-disciplinary discharge planning process, led by the attending physician.  Recommendations may be updated based on patient status, additional functional criteria and insurance authorization.  Follow Up Recommendations  Skilled nursing-short term rehab (<3 hours/day) Can patient physically be transported by private vehicle: No   Assistance Recommended at Discharge Frequent or constant Supervision/Assistance  Patient can return home with the following Two people to help with walking and/or transfers;A lot of help with bathing/dressing/bathroom;Assistance with cooking/housework;Assist for transportation;Help with stairs or ramp for entrance   Equipment Recommendations  None recommended by PT    Recommendations for Other Services       Precautions / Restrictions Precautions Precautions: Fall     Mobility  Bed Mobility Overal bed mobility: Needs  Assistance Bed Mobility: Supine to Sit, Sit to Supine     Supine to sit: Min assist Sit to supine: Mod assist   General bed mobility comments: slow, cautious movement due to pain; assist for scooting bed pad to EOB; assist for lower body to return to bed and scooting up HOB (utilized bed pads for pain control) (LEs elevated with pillows so heels floating    Transfers                   General transfer comment: pt not prepared to attempt today due to plantar foot pain but was able to tolerate placing feet on floor after IV pain meds from RN    Ambulation/Gait                   Stairs             Wheelchair Mobility    Modified Rankin (Stroke Patients Only)       Balance                                            Cognition Arousal/Alertness: Awake/alert Behavior During Therapy: WFL for tasks assessed/performed Overall Cognitive Status: Within Functional Limits for tasks assessed                                          Exercises General Exercises - Lower Extremity Ankle Circles/Pumps: AROM, Both, 10 reps, Limitations Ankle Circles/Pumps Limitations: limited by pain/dressings Long Arc Quad: AROM, Both, 10 reps,  Seated Hip ABduction/ADduction: AROM, Both, 10 reps, Seated Hip Flexion/Marching: AROM, 10 reps, Both, Seated    General Comments        Pertinent Vitals/Pain Pain Assessment Pain Assessment: 0-10 Pain Score: 10-Worst pain ever Pain Location: bilateral feet Pain Descriptors / Indicators: Tender, Other (Comment) ("slicing") Pain Intervention(s): RN gave pain meds during session, Repositioned, Monitored during session    Home Living                          Prior Function            PT Goals (current goals can now be found in the care plan section) Progress towards PT goals: Progressing toward goals    Frequency    Min 2X/week      PT Plan Current plan remains appropriate     Co-evaluation              AM-PAC PT "6 Clicks" Mobility   Outcome Measure  Help needed turning from your back to your side while in a flat bed without using bedrails?: A Lot Help needed moving from lying on your back to sitting on the side of a flat bed without using bedrails?: A Lot Help needed moving to and from a bed to a chair (including a wheelchair)?: Total Help needed standing up from a chair using your arms (e.g., wheelchair or bedside chair)?: Total Help needed to walk in hospital room?: Total Help needed climbing 3-5 steps with a railing? : Total 6 Click Score: 8    End of Session   Activity Tolerance: Patient limited by pain Patient left: in bed;with call bell/phone within reach   PT Visit Diagnosis: History of falling (Z91.81);Difficulty in walking, not elsewhere classified (R26.2)     Time: 1030-1053 PT Time Calculation (min) (ACUTE ONLY): 23 min  Charges:  $Therapeutic Exercise: 8-22 mins $Therapeutic Activity: 8-22 mins                    Thomasene Mohair PT, DPT Physical Therapist Acute Rehabilitation Services Preferred contact method: Secure Chat Weekend Pager Only: [phone] Office: [phone]    [name] 03/21/2022, 4:03 PM

## 2022-03-22 ENCOUNTER — Inpatient Hospital Stay: Payer: Medicare PPO | Admitting: Internal Medicine

## 2022-03-22 DIAGNOSIS — K860 Alcohol-induced chronic pancreatitis: Secondary | ICD-10-CM | POA: Diagnosis not present

## 2022-03-22 DIAGNOSIS — K255 Chronic or unspecified gastric ulcer with perforation: Secondary | ICD-10-CM | POA: Diagnosis not present

## 2022-03-22 DIAGNOSIS — A419 Sepsis, unspecified organism: Secondary | ICD-10-CM | POA: Diagnosis not present

## 2022-03-22 DIAGNOSIS — L03116 Cellulitis of left lower limb: Secondary | ICD-10-CM | POA: Diagnosis not present

## 2022-03-22 LAB — COMPREHENSIVE METABOLIC PANEL
ALT: 17 U/L (ref 0–44)
AST: 30 U/L (ref 15–41)
Albumin: <1.5 g/dL — ABNORMAL LOW (ref 3.5–5.0)
Alkaline Phosphatase: 120 U/L (ref 38–126)
Anion gap: 6 (ref 5–15)
BUN: 24 mg/dL — ABNORMAL HIGH (ref 8–23)
CO2: 23 mmol/L (ref 22–32)
Calcium: 7.5 mg/dL — ABNORMAL LOW (ref 8.9–10.3)
Chloride: 102 mmol/L (ref 98–111)
Creatinine, Ser: 0.7 mg/dL (ref 0.44–1.00)
GFR, Estimated: >60 mL/min (ref >60)
Glucose, Bld: 303 mg/dL — ABNORMAL HIGH (ref 70–99)
Potassium: 4.4 mmol/L (ref 3.5–5.1)
Sodium: 131 mmol/L — ABNORMAL LOW (ref 135–145)
Total Bilirubin: 0.4 mg/dL (ref 0.3–1.2)
Total Protein: 4.8 g/dL — ABNORMAL LOW (ref 6.5–8.1)

## 2022-03-22 LAB — BPAM RBC
Blood Product Expiration Date: 202402012359
Blood Product Expiration Date: 202402012359
ISSUE DATE / TIME: 202401061653
ISSUE DATE / TIME: 202401062049
Unit Type and Rh: 7300
Unit Type and Rh: 7300

## 2022-03-22 LAB — CBC WITH DIFFERENTIAL/PLATELET
Abs Immature Granulocytes: 0.36 10*3/uL — ABNORMAL HIGH (ref 0.00–0.07)
Basophils Absolute: 0.1 10*3/uL (ref 0.0–0.1)
Basophils Relative: 1 %
Eosinophils Absolute: 0.2 10*3/uL (ref 0.0–0.5)
Eosinophils Relative: 1 %
HCT: 31.3 % — ABNORMAL LOW (ref 36.0–46.0)
Hemoglobin: 10.1 g/dL — ABNORMAL LOW (ref 12.0–15.0)
Immature Granulocytes: 3 %
Lymphocytes Relative: 9 %
Lymphs Abs: 1.2 10*3/uL (ref 0.7–4.0)
MCH: 31.3 pg (ref 26.0–34.0)
MCHC: 32.3 g/dL (ref 30.0–36.0)
MCV: 96.9 fL (ref 80.0–100.0)
Monocytes Absolute: 0.9 10*3/uL (ref 0.1–1.0)
Monocytes Relative: 7 %
Neutro Abs: 10.6 10*3/uL — ABNORMAL HIGH (ref 1.7–7.7)
Neutrophils Relative %: 79 %
Platelets: 148 10*3/uL — ABNORMAL LOW (ref 150–400)
RBC: 3.23 MIL/uL — ABNORMAL LOW (ref 3.87–5.11)
RDW: 15.5 % (ref 11.5–15.5)
WBC: 13.3 10*3/uL — ABNORMAL HIGH (ref 4.0–10.5)
nRBC: 0.1 % (ref 0.0–0.2)

## 2022-03-22 LAB — PHOSPHORUS: Phosphorus: 2.7 mg/dL (ref 2.5–4.6)

## 2022-03-22 LAB — TYPE AND SCREEN
ABO/RH(D): B POS
Antibody Screen: NEGATIVE
Unit division: 0
Unit division: 0

## 2022-03-22 LAB — TRIGLYCERIDES: Triglycerides: 62 mg/dL (ref <150)

## 2022-03-22 LAB — GLUCOSE, CAPILLARY
Glucose-Capillary: 202 mg/dL — ABNORMAL HIGH (ref 70–99)
Glucose-Capillary: 172 mg/dL — ABNORMAL HIGH (ref 70–99)
Glucose-Capillary: 140 mg/dL — ABNORMAL HIGH (ref 70–99)

## 2022-03-22 LAB — MAGNESIUM: Magnesium: 1.8 mg/dL (ref 1.7–2.4)

## 2022-03-22 MED ORDER — TRAVASOL 10 % IV SOLN
INTRAVENOUS | Status: AC
  Filled 2022-03-22: qty 998.4

## 2022-03-22 MED ORDER — ALBUMIN HUMAN 25 % IV SOLN
25.0000 g | Freq: Four times a day (QID) | INTRAVENOUS | Status: AC
  Administered 2022-03-22 – 2022-03-24 (×8): 25 g via INTRAVENOUS
  Filled 2022-03-22 (×8): qty 100

## 2022-03-22 MED ORDER — INSULIN ASPART 100 UNIT/ML IJ SOLN
0.0000 [IU] | Freq: Four times a day (QID) | INTRAMUSCULAR | Status: DC
  Administered 2022-03-22: 3 [IU] via SUBCUTANEOUS
  Administered 2022-03-22 – 2022-03-23 (×3): 4 [IU] via SUBCUTANEOUS
  Administered 2022-03-23: 3 [IU] via SUBCUTANEOUS
  Administered 2022-03-24: 7 [IU] via SUBCUTANEOUS
  Administered 2022-03-24: 15 [IU] via SUBCUTANEOUS
  Administered 2022-03-24: 11 [IU] via SUBCUTANEOUS
  Administered 2022-03-24 – 2022-03-25 (×2): 7 [IU] via SUBCUTANEOUS
  Administered 2022-03-25: 3 [IU] via SUBCUTANEOUS
  Administered 2022-03-25: 4 [IU] via SUBCUTANEOUS
  Administered 2022-03-25: 7 [IU] via SUBCUTANEOUS
  Administered 2022-03-26: 3 [IU] via SUBCUTANEOUS
  Administered 2022-03-26: 7 [IU] via SUBCUTANEOUS
  Administered 2022-03-27: 4 [IU] via SUBCUTANEOUS
  Administered 2022-03-27: 15 [IU] via SUBCUTANEOUS
  Administered 2022-03-27: 7 [IU] via SUBCUTANEOUS
  Administered 2022-03-28 (×2): 4 [IU] via SUBCUTANEOUS
  Administered 2022-03-28: 3 [IU] via SUBCUTANEOUS
  Administered 2022-03-29: 15 [IU] via SUBCUTANEOUS
  Administered 2022-03-29 (×2): 4 [IU] via SUBCUTANEOUS
  Administered 2022-03-29: 3 [IU] via SUBCUTANEOUS
  Administered 2022-03-30: 4 [IU] via SUBCUTANEOUS
  Administered 2022-03-30: 3 [IU] via SUBCUTANEOUS
  Administered 2022-03-30: 4 [IU] via SUBCUTANEOUS
  Administered 2022-03-31: 3 [IU] via SUBCUTANEOUS
  Administered 2022-03-31: 11 [IU] via SUBCUTANEOUS
  Administered 2022-03-31: 7 [IU] via SUBCUTANEOUS
  Administered 2022-03-31: 3 [IU] via SUBCUTANEOUS
  Administered 2022-04-01: 4 [IU] via SUBCUTANEOUS
  Administered 2022-04-01: 7 [IU] via SUBCUTANEOUS
  Administered 2022-04-01: 4 [IU] via SUBCUTANEOUS
  Administered 2022-04-01: 7 [IU] via SUBCUTANEOUS
  Administered 2022-04-02 (×2): 11 [IU] via SUBCUTANEOUS
  Administered 2022-04-02: 7 [IU] via SUBCUTANEOUS
  Administered 2022-04-02: 15 [IU] via SUBCUTANEOUS
  Administered 2022-04-03: 7 [IU] via SUBCUTANEOUS
  Administered 2022-04-04: 4 [IU] via SUBCUTANEOUS
  Administered 2022-04-04 – 2022-04-05 (×3): 3 [IU] via SUBCUTANEOUS
  Administered 2022-04-05: 4 [IU] via SUBCUTANEOUS
  Administered 2022-04-05 (×2): 20 [IU] via SUBCUTANEOUS
  Administered 2022-04-06: 3 [IU] via SUBCUTANEOUS

## 2022-03-22 MED ORDER — METOPROLOL TARTRATE 25 MG PO TABS
12.5000 mg | ORAL_TABLET | Freq: Two times a day (BID) | ORAL | Status: DC
  Administered 2022-03-22 – 2022-04-03 (×25): 12.5 mg via ORAL
  Filled 2022-03-22 (×25): qty 1

## 2022-03-22 MED ORDER — VITAMIN A 3 MG (10000 UNIT) PO CAPS
50000.0000 [IU] | ORAL_CAPSULE | Freq: Every day | ORAL | Status: AC
  Administered 2022-03-22 – 2022-04-04 (×14): 50000 [IU] via ORAL
  Filled 2022-03-22 (×14): qty 5

## 2022-03-22 NOTE — Progress Notes (Signed)
PHARMACY - TOTAL PARENTERAL NUTRITION CONSULT NOTE   Indication:  chronic severe malnutrition with desquamating rash  Patient Measurements: Height: 5\' 8"  (172.7 cm) Weight: 56.6 kg (124 lb 12.5 oz) IBW/kg (Calculated) : 63.9 TPN AdjBW (KG): 46 Body mass index is 18.97 kg/m.  Assessment: 67 year old female with severe malnutrition related to chronic illness. Patient with desquamating rash likely related to significant nutritional/vitamin deficiencies. Pharmacy consulted to initiate TPN to accelerate improvement in nutritional deficiencies.  Glucose / Insulin: no hx diabetes. CBG goal <180. - CBGs range: 202 - 333  - sSSI:  25 units / 24 hr on moderate SSI q8h Electrolytes: Na (131) remains low, Mg (1.8) WNL but on lower end of normal. All other lytes WNL including CorrCa (9.9) Renal: SCr WNL, BUN slightly elevated Hepatic: AST/ALT WNL, albumin low < 1.5 Intake / Output: Strict I/O not measured.  - UOP: 300 mL + 3 uncharted; stool: 4 unmeasured - No mIVF GI Imaging: NA GI Surgeries / Procedures: NA  Central access: 1/3 TPN start date: 1/3  Nutritional Goals: Goal TPN rate is 80 mL/hr (provides 100 g of protein and 1954 kcals per day)  RD Assessment: Estimated Needs Total Energy Estimated Needs: 1900-2100 Total Protein Estimated Needs: 95-105g Total Fluid Estimated Needs: 2L/day  Current Nutrition: regular diet ordered, 50% meal intake charted Enteral supplements:  Ensure Plus TID, Prosource BID (all charted yesterday)  Plan:  Now: Increase CBG check and SSI from moderate q8h to resistant q6h  At 1800: Continue TPN at goal rate 80 mL/hr Electrolytes in TPN: Increase Na, Mg Na 150 mEq/L, K 40 mEq/L, Ca 5 mEq/L, Mg 6 mEq/L, and Phos 15 mmol/L. Cl:Ac 1:1 Add standard MVI and trace elements to TPN (provides 60 mcg selenium, adequate per discussion with RD) Add thiamine 427 mg and folic acid 1 mg to TPN Continue CBG checks q6h + resistant SSI Monitor TPN labs on Mon/Thurs.  Recheck electrolytes with AM labs tomorrow.   Lenis Noon, PharmD 03/22/22 8:06 AM

## 2022-03-22 NOTE — Progress Notes (Signed)
PROGRESS NOTE    Carolyn [name]  ZHY:[id number] DOB: April 22, 1955 DOA: 03/13/2022 PCP: Pcp, No    Chief Complaint  Patient presents with   Skin Problem    Brief Narrative:  HPI This is a 67 year old female, past medical history of chronic pancreatitis, history of prior alcoholism, gastroesophageal reflux, prior vitamin D deficiency documented. She has known malabsorption issues and has been on Creon. She presents with cellulitis and sepsis related to a ongoing rash of the hands and feet. It appears that the rash has been going on for several weeks continued to get worse. As started off with a small blister the blister then ruptured the skin started to dry out crack and flake. She is starting to flake and desquamate portions of the hands and feet. She developed increased pain and swelling in the right hand that developed redness that tracks into the forearm. She presents to the ER with an elevated white count and was treated with antibiotics for concern of sepsis.   Assessment & Plan:   Principal Problem:   Severe sepsis ([place]) Active Problems:   Protein-calorie malnutrition, severe (HCC)   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HCC)   Chronic gastric ulcer with perforation (HCC)   Desquamative dermatitis   Sepsis ([place])   Cellulitis of left lower extremity   Hypophosphatemia   Anemia of chronic disease   Localized swelling of right upper extremity   NSVT (nonsustained ventricular tachycardia) (HCC)   #1 severe sepsis secondary to discriminative dermatitis with superimposed cellulitis and possible underlying osteomyelitis -Patient noted to have presented with complicated nonhealing desquamative dermatitis involving hands and feet complicated by severe sepsis due to cellulitis/wound infection. -MRI of bilateral hands and feet done without any definite evidence of osteomyelitis. -Patient initially admitted to the critical care service, concern for probable vitamin/nutritional deficiencies  versus paraneoplastic syndromes. -Concern for possible nutritional deficiencies, B2, B6, tryptophan, zinc, thiamine etc. could lead to this. -Patient noted to be prior alcoholic now with a chronic pancreatitis on Creon and likely has pre-existing condition of malabsorption as well as possible gastric surgery unsure as to what the patient has had this on not however per PCCM. -Patient placed empirically on IV vancomycin, IV Rocephin which we will continue, patient seen by ID recommending transition to doxycycline and Augmentin to complete a 10-day course of antibiotics on discharge. -Transitioned to oral Augmentin and doxycycline 03/19/2022 to complete course of antibiotic treatment. -Continue vitamin D3, vitamin B12, zinc, vitamin D3. -Continue vitamin A, selenium. -Will need follow-up with dermatology (we have followed in [name]) -Will need outpatient follow-up with podiatry for bone biopsy to evaluate for osteomyelitis. -Outpatient follow-up with ID which has been set for 03/22/2022. -Patient unable to ambulate seen by PT would likely require SNF placement. -Patient with ongoing pain, causing tachycardia, continue current regimen and IV Dilaudid as needed. -Continue Neurontin 300 mg 3 times daily, scheduled Tylenol.  -Consulted palliative care for goals of care and symptom management. -Supportive care.  2.  History of chronic alcoholic pancreatitis -Continue Creon.   3.  Hepatic cirrhosis -Being followed in the outpatient setting by [facility]. -BP soft and as such diuretics held spironolactone and Lasix held. -Patient with no hepatic encephalopathy, no evidence of abdominal ascites. -Outpatient follow-up with GI.  4.  Gastric chronic ulcer with contained perforation -Stable. -Continue PPI twice daily, Pepcid twice daily.    5.  Hypothyroidism -TSH noted at 33.958 on 03/15/2022. -Continue Synthroid 50 mcg's daily.   -Will need repeat labs in 4  to 6 weeks in the  outpatient setting.    6.  Hypophosphatemia/hypomagnesemia -Magnesium at 1.8.  -Phosphorus at 2.7.  -Repletion per pharmacy.   -Repeat labs in the AM.    7.  Severe protein calorie malnutrition -Patient seen by dietitian, PICC line placed and patient started on TPN per pharmacy. -Albumin level noted at< 1.5 -IV albumin every 6 hours x 24 hours.    8.  Anemia of chronic disease/folate deficiency -Hemoglobin noted to go as low as 6.5, status post transfusion 1 unit packed red blood cells.   Hemoglobin noted at 7.1 on 03/20/2022.  Status posttransfusion 2 units packed red blood cells hemoglobin currently at 10.1.   9.  Right upper extremity swelling and pain -Clinical improvement.   -RUE Dopplers negative for DVT. -Keep right upper extremity elevated.  10.  NSVT -Patient noted to have 7 beat run of NSVT on 03/19/2022, patient was asymptomatic.   -Electrolytes repleted potassium today currently at 4.4, magnesium at 1.8. -Hemoglobin noted at 7.1 on 03/20/2022, status post transfusion 2 units packed red blood cells hemoglobin currently at 10.1 -Repeat labs in the AM.     DVT prophylaxis: Heparin Code Status: Full Family Communication: Updated patient.  No family at bedside.  Disposition: SNF.  Status is: Inpatient Remains inpatient appropriate because: Severity of illness.  Unsafe disposition.   Consultants:  PCCM admission. Infectious disease: Dr. Thedore Mins 03/15/2022 Wound care RN 03/14/2022  Procedures:  Plain films of bilateral feet 03/13/2022 Chest x-ray 03/15/2022 MRI left foot and right foot 03/13/2022 PICC line placement 03/17/2022 Transfused 2 units packed red blood cells 03/20/2022 Transfuse 1 unit packed red blood cells 03/15/2022 Right upper extremity Dopplers 03/19/2022  Antimicrobials:  Anti-infectives (From admission, onward)    Start     Dose/Rate Route Frequency Ordered Stop   03/19/22 2200  amoxicillin-clavulanate (AUGMENTIN) 875-125 MG per tablet 1 tablet        1  tablet Oral Every 12 hours 03/19/22 1115 03/22/22 2359   03/19/22 2200  doxycycline (VIBRA-TABS) tablet 100 mg        100 mg Oral Every 12 hours 03/19/22 1115 03/22/22 2359   03/14/22 2200  vancomycin (VANCOREADY) IVPB 500 mg/100 mL  Status:  Discontinued        500 mg 100 mL/hr over 60 Minutes Intravenous Every 24 hours 03/13/22 2049 03/19/22 1116   03/13/22 1915  vancomycin (VANCOCIN) IVPB 1000 mg/200 mL premix        1,000 mg 200 mL/hr over 60 Minutes Intravenous  Once 03/13/22 1908 03/14/22 0103   03/13/22 1615  cefTRIAXone (ROCEPHIN) 2 g in sodium chloride 0.9 % 100 mL IVPB        2 g 200 mL/hr over 30 Minutes Intravenous Every 24 hours 03/13/22 1603 03/19/22 1959         Subjective: Patient laying in bed.  Feels pain medication is helping.  Complains of pains in the feet and arm.  No chest pain.  No shortness of breath.  Breakfast at bedside which patient has not touched.   Objective: Vitals:   03/21/22 2231 03/22/22 0500 03/22/22 0553 03/22/22 1007  BP: (!) 164/87  (!) 165/84 (!) 160/87  Pulse: 100  (!) 122 (!) 128  Resp: 17  18   Temp: 99.2 F (37.3 C)  99.2 F (37.3 C)   TempSrc: Oral  Oral   SpO2: 95%  94%   Weight:  56.6 kg    Height:        Intake/Output  Summary (Last 24 hours) at 03/22/2022 1201 Last data filed at 03/22/2022 1030 Gross per 24 hour  Intake 1441.95 ml  Output 300 ml  Net 1141.95 ml    Filed Weights   03/20/22 0447 03/21/22 0500 03/22/22 0500  Weight: 54.5 kg 55.6 kg 56.6 kg    Examination:  General exam: Emaciated, cachectic, frail.  Respiratory system: CTAB anterior lung fields.  No wheezes, no crackles, no rhonchi.  Fair air movement.  No use of accessory muscles of respiration.  Lungs clear to auscultation bilaterally.  No wheezes, no crackles, no rhonchi.  Fair air movement.  No use of accessory muscles Cardiovascular system: Tachycardia.  No murmurs rubs or gallops.  No JVD.  No lower extremity edema.   Gastrointestinal system: Abdomen  is soft, nontender, nondistended, positive bowel sounds.  No rebound.  No guarding.   Central nervous system: Alert and oriented.  Moving extremities spontaneously.  No focal neurological deficits.  Extremities: Bilateral hands, bilateral feet wrapped in gauze/Kerlix.  Right upper extremity with some swelling which has decreased, decreased tenderness to palpation. Skin: No rashes, lesions or ulcers Psychiatry: Judgement and insight appear normal. Mood & affect appropriate.     Data Reviewed: I have personally reviewed following labs and imaging studies  CBC: Recent Labs  Lab 03/17/22 0920 03/18/22 1209 03/18/22 1508 03/19/22 1200 03/20/22 0409 03/21/22 0309 03/22/22 0234  WBC 16.0* 12.7*  --  14.9* 11.6* 12.6* 13.3*  NEUTROABS 13.2* 10.4*  --  12.3* 9.5*  --  10.6*  HGB 9.3* 6.8* 7.3* 7.5* 7.1* 10.0* 10.1*  HCT 28.1* 20.9* 22.6* 23.0* 22.5* 30.5* 31.3*  MCV 94.0 95.9  --  96.2 98.3 95.3 96.9  PLT 145* 127*  --  133* 126* 131* 148*     Basic Metabolic Panel: Recent Labs  Lab 03/17/22 0920 03/18/22 0411 03/19/22 0830 03/20/22 0409 03/21/22 0309 03/22/22 0234  NA 131* 128* 130* 133* 134* 131*  K 4.1 3.7 4.3 4.5 4.5 4.4  CL 102 101 101 103 105 102  CO2 22 23 25 25 25 23   GLUCOSE 152* 199* 168* 228* 268* 303*  BUN 16 17 19 22 23  24*  CREATININE 0.72 0.65 0.64 0.61 0.59 0.70  CALCIUM 7.1* 6.5* 7.2* 7.1* 7.2* 7.5*  MG 1.4* 2.8* 1.9 1.7 2.0 1.8  PHOS 1.8* 2.0* 2.7 2.9  --  2.7     GFR: Estimated Creatinine Clearance: 63.5 mL/min (by C-G formula based on SCr of 0.7 mg/dL).  Liver Function Tests: Recent Labs  Lab 03/18/22 0411 03/22/22 0234  AST 29 30  ALT 20 17  ALKPHOS 182* 120  BILITOT 0.5 0.4  PROT 4.4* 4.8*  ALBUMIN <1.5* <1.5*     CBG: Recent Labs  Lab 03/21/22 0022 03/21/22 0830 03/21/22 1635 03/21/22 2321 03/22/22 0752  GLUCAP 203* 236* 332* 313* 202*      Recent Results (from the past 240 hour(s))  Blood culture (routine x 2)      Status: None   Collection Time: 03/13/22  3:20 PM   Specimen: BLOOD  Result Value Ref Range Status   Specimen Description   Final    BLOOD BLOOD LEFT ARM Performed at [facility], [address], [place], [place]    Special Requests   Final    BOTTLES DRAWN AEROBIC AND ANAEROBIC Blood Culture adequate volume Performed at [facility], [address], [place], [place]    Culture   Final    NO  GROWTH 5 DAYS Performed at [facility], [address] [place], [place] [address]    Report Status 03/18/2022 FINAL  Final  Blood culture (routine x 2)     Status: None   Collection Time: 03/13/22  3:20 PM   Specimen: BLOOD  Result Value Ref Range Status   Specimen Description   Final    BLOOD BLOOD RIGHT ARM Performed at [facility], [address] [place], [place] [address]    Special Requests   Final    BOTTLES DRAWN AEROBIC AND ANAEROBIC Blood Culture results may not be optimal due to an excessive volume of blood received in culture bottles Performed at [facility], [address], [place], [place] [address]    Culture   Final    NO GROWTH 5 DAYS Performed at [facility], [address] [place], [place] [address]    Report Status 03/18/2022 FINAL  Final         Radiology Studies: No results found.      Scheduled Meds:  (feeding supplement) PROSource Plus  30 mL Oral BID BM   acetaminophen  1,000 mg Oral TID   amoxicillin-clavulanate  1 tablet Oral Q12H   Chlorhexidine Gluconate Cloth  6 each Topical Daily   cholecalciferol  1,000 Units Oral Daily   doxycycline  100 mg Oral Q12H   enoxaparin (LOVENOX) injection  30 mg Subcutaneous Q24H   famotidine  10 mg Oral BID   feeding supplement  237 mL Oral TID BM   gabapentin  300 mg Oral TID   insulin aspart  0-20 Units Subcutaneous Q6H   levothyroxine  50 mcg Oral Q0600   lipase/protease/amylase  24,000 Units  Oral TID WC   metoprolol tartrate  12.5 mg Oral BID   mupirocin cream   Topical Once   pantoprazole  40 mg Oral BID AC   pyridOXINE  100 mg Oral Daily   silver sulfADIAZINE   Topical Daily   sodium chloride flush  10-40 mL Intracatheter Q12H   vitamin A  50,000 Units Oral Daily   vitamin B-12  100 mcg Oral Daily   vitamin E  400 Units Oral Daily   zinc sulfate  220 mg Oral Daily   Continuous Infusions:  albumin human 25 g (03/22/22 1021)   TPN ADULT (ION) 80 mL/hr at 03/21/22 1737   TPN ADULT (ION)       LOS: 8 days    Time spent: 35 minutes    Ramiro Harvest, MD [facility]   To contact the attending provider between 7A-7P or the covering provider during after hours 7P-7A, please log into the web site [email or web address] and access using universal [place] password for that web site. If you do not have the password, please call the hospital operator.  03/22/2022, 12:01 PM

## 2022-03-23 ENCOUNTER — Encounter: Payer: Medicare PPO | Admitting: Gastroenterology

## 2022-03-23 ENCOUNTER — Inpatient Hospital Stay (HOSPITAL_COMMUNITY): Payer: Medicare PPO

## 2022-03-23 DIAGNOSIS — Z9981 Dependence on supplemental oxygen: Secondary | ICD-10-CM

## 2022-03-23 DIAGNOSIS — Z8711 Personal history of peptic ulcer disease: Secondary | ICD-10-CM | POA: Diagnosis not present

## 2022-03-23 DIAGNOSIS — K861 Other chronic pancreatitis: Secondary | ICD-10-CM | POA: Diagnosis not present

## 2022-03-23 DIAGNOSIS — Z515 Encounter for palliative care: Secondary | ICD-10-CM

## 2022-03-23 DIAGNOSIS — I5033 Acute on chronic diastolic (congestive) heart failure: Secondary | ICD-10-CM

## 2022-03-23 DIAGNOSIS — Z9049 Acquired absence of other specified parts of digestive tract: Secondary | ICD-10-CM

## 2022-03-23 DIAGNOSIS — E43 Unspecified severe protein-calorie malnutrition: Secondary | ICD-10-CM | POA: Diagnosis not present

## 2022-03-23 DIAGNOSIS — D638 Anemia in other chronic diseases classified elsewhere: Secondary | ICD-10-CM | POA: Diagnosis not present

## 2022-03-23 DIAGNOSIS — R634 Abnormal weight loss: Secondary | ICD-10-CM | POA: Diagnosis not present

## 2022-03-23 DIAGNOSIS — R0600 Dyspnea, unspecified: Secondary | ICD-10-CM

## 2022-03-23 DIAGNOSIS — K7030 Alcoholic cirrhosis of liver without ascites: Secondary | ICD-10-CM | POA: Diagnosis not present

## 2022-03-23 DIAGNOSIS — J9601 Acute respiratory failure with hypoxia: Secondary | ICD-10-CM | POA: Diagnosis not present

## 2022-03-23 DIAGNOSIS — I5032 Chronic diastolic (congestive) heart failure: Secondary | ICD-10-CM

## 2022-03-23 DIAGNOSIS — L03115 Cellulitis of right lower limb: Secondary | ICD-10-CM

## 2022-03-23 DIAGNOSIS — K860 Alcohol-induced chronic pancreatitis: Secondary | ICD-10-CM | POA: Diagnosis not present

## 2022-03-23 DIAGNOSIS — D649 Anemia, unspecified: Secondary | ICD-10-CM

## 2022-03-23 DIAGNOSIS — J690 Pneumonitis due to inhalation of food and vomit: Secondary | ICD-10-CM

## 2022-03-23 DIAGNOSIS — I4729 Other ventricular tachycardia: Secondary | ICD-10-CM

## 2022-03-23 DIAGNOSIS — A419 Sepsis, unspecified organism: Secondary | ICD-10-CM | POA: Diagnosis not present

## 2022-03-23 LAB — BRAIN NATRIURETIC PEPTIDE: B Natriuretic Peptide: 3564.6 pg/mL — ABNORMAL HIGH (ref 0.0–100.0)

## 2022-03-23 LAB — PROTIME-INR
INR: 1.3 — ABNORMAL HIGH (ref 0.8–1.2)
Prothrombin Time: 16.4 seconds — ABNORMAL HIGH (ref 11.4–15.2)

## 2022-03-23 LAB — GLUCOSE, CAPILLARY
Glucose-Capillary: 152 mg/dL — ABNORMAL HIGH (ref 70–99)
Glucose-Capillary: 153 mg/dL — ABNORMAL HIGH (ref 70–99)
Glucose-Capillary: 162 mg/dL — ABNORMAL HIGH (ref 70–99)
Glucose-Capillary: 171 mg/dL — ABNORMAL HIGH (ref 70–99)

## 2022-03-23 LAB — BASIC METABOLIC PANEL
Anion gap: 7 (ref 5–15)
BUN: 25 mg/dL — ABNORMAL HIGH (ref 8–23)
CO2: 25 mmol/L (ref 22–32)
Calcium: 8.2 mg/dL — ABNORMAL LOW (ref 8.9–10.3)
Chloride: 104 mmol/L (ref 98–111)
Creatinine, Ser: 0.43 mg/dL — ABNORMAL LOW (ref 0.44–1.00)
GFR, Estimated: >60 mL/min (ref >60)
Glucose, Bld: 156 mg/dL — ABNORMAL HIGH (ref 70–99)
Potassium: 4.4 mmol/L (ref 3.5–5.1)
Sodium: 136 mmol/L (ref 135–145)

## 2022-03-23 LAB — CBC
HCT: 28.1 % — ABNORMAL LOW (ref 36.0–46.0)
Hemoglobin: 8.8 g/dL — ABNORMAL LOW (ref 12.0–15.0)
MCH: 30.7 pg (ref 26.0–34.0)
MCHC: 31.3 g/dL (ref 30.0–36.0)
MCV: 97.9 fL (ref 80.0–100.0)
Platelets: 128 10*3/uL — ABNORMAL LOW (ref 150–400)
RBC: 2.87 MIL/uL — ABNORMAL LOW (ref 3.87–5.11)
RDW: 15.3 % (ref 11.5–15.5)
WBC: 13 10*3/uL — ABNORMAL HIGH (ref 4.0–10.5)
nRBC: 0 % (ref 0.0–0.2)

## 2022-03-23 LAB — MAGNESIUM: Magnesium: 2 mg/dL (ref 1.7–2.4)

## 2022-03-23 LAB — PHOSPHORUS: Phosphorus: 3.3 mg/dL (ref 2.5–4.6)

## 2022-03-23 LAB — PREALBUMIN: Prealbumin: <5 mg/dL — ABNORMAL LOW (ref 18–38)

## 2022-03-23 LAB — CORTISOL: Cortisol, Plasma: 15.4 ug/dL

## 2022-03-23 LAB — T4, FREE: Free T4: 1.07 ng/dL (ref 0.61–1.12)

## 2022-03-23 MED ORDER — DEXAMETHASONE SODIUM PHOSPHATE 10 MG/ML IJ SOLN
8.0000 mg | INTRAMUSCULAR | Status: DC
  Administered 2022-03-23 – 2022-03-26 (×4): 8 mg via INTRAVENOUS
  Filled 2022-03-23 (×4): qty 1

## 2022-03-23 MED ORDER — HYDROMORPHONE HCL 1 MG/ML IJ SOLN
2.0000 mg | INTRAMUSCULAR | Status: DC | PRN
  Administered 2022-03-23 – 2022-04-04 (×53): 2 mg via INTRAVENOUS
  Filled 2022-03-23 (×52): qty 2

## 2022-03-23 MED ORDER — FUROSEMIDE 10 MG/ML IJ SOLN
40.0000 mg | Freq: Once | INTRAMUSCULAR | Status: AC
  Administered 2022-03-23: 40 mg via INTRAVENOUS
  Filled 2022-03-23: qty 4

## 2022-03-23 MED ORDER — LIP MEDEX EX OINT
TOPICAL_OINTMENT | CUTANEOUS | Status: DC | PRN
  Filled 2022-03-23: qty 7

## 2022-03-23 MED ORDER — SODIUM CHLORIDE 0.9 % IV SOLN
3.0000 g | Freq: Four times a day (QID) | INTRAVENOUS | Status: AC
  Administered 2022-03-23 – 2022-04-01 (×37): 3 g via INTRAVENOUS
  Filled 2022-03-23 (×37): qty 8

## 2022-03-23 MED ORDER — HYDROMORPHONE HCL 1 MG/ML IJ SOLN: 2.0000 mg | INTRAMUSCULAR | Status: DC

## 2022-03-23 MED ORDER — TRAVASOL 10 % IV SOLN
INTRAVENOUS | Status: AC
  Filled 2022-03-23: qty 998.4

## 2022-03-23 MED ORDER — LIOTHYRONINE SODIUM 25 MCG PO TABS
25.0000 ug | ORAL_TABLET | Freq: Every day | ORAL | Status: DC
  Administered 2022-03-23 – 2022-04-12 (×21): 25 ug via ORAL
  Filled 2022-03-23 (×21): qty 1

## 2022-03-23 MED ORDER — IPRATROPIUM-ALBUTEROL 0.5-2.5 (3) MG/3ML IN SOLN
3.0000 mL | RESPIRATORY_TRACT | Status: DC | PRN
  Administered 2022-03-23 – 2022-03-25 (×5): 3 mL via RESPIRATORY_TRACT
  Filled 2022-03-23 (×5): qty 3

## 2022-03-23 MED ORDER — FUROSEMIDE 10 MG/ML IJ SOLN
40.0000 mg | Freq: Two times a day (BID) | INTRAMUSCULAR | Status: DC
  Administered 2022-03-23 – 2022-03-25 (×5): 40 mg via INTRAVENOUS
  Filled 2022-03-23 (×5): qty 4

## 2022-03-23 MED ORDER — PHENOBARBITAL SODIUM 65 MG/ML IJ SOLN
32.5000 mg | Freq: Every day | INTRAMUSCULAR | Status: DC
  Administered 2022-03-23 – 2022-04-01 (×10): 32.5 mg via INTRAVENOUS
  Filled 2022-03-23 (×10): qty 1

## 2022-03-23 MED ORDER — PANTOPRAZOLE SODIUM 40 MG IV SOLR
40.0000 mg | Freq: Two times a day (BID) | INTRAVENOUS | Status: DC
  Administered 2022-03-23 – 2022-03-28 (×12): 40 mg via INTRAVENOUS
  Filled 2022-03-23 (×12): qty 10

## 2022-03-23 NOTE — Consult Note (Signed)
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 She has massive volume overload with a BNP of >3000. She had an episode of vomiting last PM and had an aspiration event and is now hypoxic.  I met with patient and her husband at bedside. I explained that she has many very complex and serious medical problems that are life threatening and I expressed that was concerned about her ability to survive her condition.Based on her current condition and worsening status, her prognosis for recovery is poor and many of her issues are not reversible -even with treatment, he end stages are rapidly terminal and involve significant amounts of suffering. I let her know that she had choices and options and that at any point she could elect for comfort care and allow for a natural death to occur without putting her through any more painful and aggressive medical interventions that so far have not really improved her QOL or condition.  I reassured her and her husband that we were doing everything possible to treat her infection, correct her FEN issues and manage the complexity of her many problems.  I also discussed her code status and what to do in the event she stopped breathing and her heart stopped. She agrees with my recommendation to not do chest compressions, but she is open to other interventions that are medically appropriate to treat any decompensated state that may be reversible  or treatable.  She has complex pain management needs- was on a buprenorphine patch pta. She has large opioid tolerance.She has moderate to severe anxiety and air hunger on my evaluation.Her pain is whole body and generalized.   Recommendations: DNR, Medical Interventions desired if medically indicated and appropriate. Inreased Hydromorphone to 2mg  q2, Fentayl patch 25 mcg(dose eq is closer to 50) Add phenobarbital for anxiety QHS-if tolerates will schedule as PRN-ok to give IV-now w difficulty swallowing pills. Added Decadron 8mg  IV daily Continue aggressive diuresis Continue ABX Check T3, T4 and Cortisol   If she continues to drop her Hb or develops acute GIB or does not show any improvement in the next 24-48 hours it would be most appropriate to discuss hospice or EOL comfort care options. For now we will continue to treat medical issues aggressively and also provide a high level of pain and symptom management.   Lane Hacker, DO Palliative Medicine  Time: 70 minutes

## 2022-03-23 NOTE — TOC Progression Note (Signed)
Transition of Care [facility]) - Progression Note    Patient Details  Name: Carolyn [name] MRN: [id number] Date of Birth: 03/25/1955  Transition of Care [facility]) CM/SW Contact  Leeroy Cha, RN Phone Number: 03/23/2022, 8:57 AM  Clinical Narrative:    Continues on the iv tpn.   Expected Discharge Plan: [place] Barriers to Discharge: Continued Medical Work up  Expected Discharge Plan and Services   Discharge Planning Services: CM Consult Post Acute Care Choice: [place] arrangements for the past 2 months: Single Family Home                                       Social Determinants of Health (SDOH) Interventions SDOH Screenings   Food Insecurity: No Food Insecurity (03/15/2022)  Housing: Low Risk  (03/15/2022)  Transportation Needs: No Transportation Needs (03/15/2022)  Utilities: At Risk (03/15/2022)  Depression (PHQ2-9): Low Risk  (07/22/2020)  Tobacco Use: Low Risk  (03/13/2022)    Readmission Risk Interventions   No data to display

## 2022-03-23 NOTE — Progress Notes (Signed)
PROGRESS NOTE    Carolyn [name]  ZHY:[id number] DOB: 1956-02-02 DOA: 03/13/2022 PCP: Pcp, No    Chief Complaint  Patient presents with   Skin Problem    Brief Narrative:  HPI This is a 67 year old female, past medical history of chronic pancreatitis, history of prior alcoholism, gastroesophageal reflux, prior vitamin D deficiency documented. She has known malabsorption issues and has been on Creon. She presents with cellulitis and sepsis related to a ongoing rash of the hands and feet. It appears that the rash has been going on for several weeks continued to get worse. As started off with a small blister the blister then ruptured the skin started to dry out crack and flake. She is starting to flake and desquamate portions of the hands and feet. She developed increased pain and swelling in the right hand that developed redness that tracks into the forearm. She presents to the ER with an elevated white count and was treated with antibiotics for concern of sepsis.   Assessment & Plan:   Principal Problem:   Severe sepsis (HCC) Active Problems:   Severe protein-calorie malnutrition (HCC)   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HCC)   Acute respiratory failure with hypoxia (HCC)   Chronic gastric ulcer with perforation (HCC)   Desquamative dermatitis   Sepsis (HCC)   Cellulitis of left lower extremity   Hypophosphatemia   Anemia of chronic disease   Localized swelling of right upper extremity   NSVT (nonsustained ventricular tachycardia) (HCC)   History of gastric ulcer   Abnormal loss of weight   Aspiration pneumonia of both lungs (HCC)   Acute on chronic diastolic CHF (congestive heart failure) (HCC)   #1 severe sepsis secondary to discriminative dermatitis with superimposed cellulitis and possible underlying osteomyelitis -Patient noted to have presented with complicated nonhealing desquamative dermatitis involving hands and feet complicated by severe sepsis due to cellulitis/wound  infection. -MRI of bilateral hands and feet done without any definite evidence of osteomyelitis. -Patient initially admitted to the critical care service, concern for probable vitamin/nutritional deficiencies versus paraneoplastic syndromes. -Concern for possible nutritional deficiencies, B2, B6, tryptophan, zinc, thiamine etc. could lead to this. -Patient noted to be prior alcoholic now with a chronic pancreatitis on Creon and likely has pre-existing condition of malabsorption as well as possible gastric surgery unsure as to what the patient has had this on not however per PCCM. -Patient placed empirically on IV vancomycin, IV Rocephin and subsequently transition to doxycycline and Augmentin and just completed a 10-day course of antibiotic treatment 03/22/2022.  -Continue vitamin D3, vitamin B12, zinc, vitamin D3. -Continue vitamin A, selenium. -Will need follow-up with dermatology (we have followed in [place]) -Will need outpatient follow-up with podiatry for bone biopsy to evaluate for osteomyelitis. -Outpatient follow-up with ID which has been set for 03/22/2022. -Patient unable to ambulate seen by PT would likely require SNF placement. -Patient with ongoing pain, causing tachycardia, continue current regimen and IV Dilaudid as needed. -Continue Neurontin 300 mg 3 times daily, scheduled Tylenol.  -Consulted palliative care for goals of care and symptom management. -Supportive care.  2.  Acute hypoxic respiratory failure likely secondary to acute on chronic diastolic CHF exacerbation +/- aspiration pneumonia -Patient noted to have a bout of nausea and emesis yesterday evening, noted to have increased O2 requirements currently on 5 L nasal cannula and was on room air 24 hours ago. -Chest x-ray done this morning with new bilateral patchy airspace opacities possibly due to multifocal infection or pulmonary edema, small  bilateral pleural effusions. -BNP noted to be elevated  at 3564.6 -Patient on IV albumin every 6 hours x 24 hours. -Placed on Lasix 40 mg IV every 12 hours, strict I's and O's, daily weights. -Placed empirically on IV Unasyn. -Repeat chest x-ray in 48 hours and if significant clinical improvement with diuresis would likely stop IV antibiotics at that time. -  3.  History of chronic alcoholic pancreatitis -Continue Creon.   4.  Hepatic cirrhosis -Being followed in the outpatient setting by [facility]. -BP soft and as such diuretics held spironolactone and Lasix held. -Patient with no hepatic encephalopathy, no evidence of abdominal ascites. -Outpatient follow-up with GI.  5.  Gastric chronic ulcer with contained perforation -Stable. -Patient with continued trickling down of hemoglobin, status post transfusion 3 units total of packed red blood cells hemoglobin currently at 8.8 from 10.1 yesterday. 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   -Hemoglobin seems to be trickling back down.   -Continue PPI twice daily, Pepcid twice daily.  -Consult with GI to see if repeat EGD needed, further evaluation and management.  6.  Hypothyroidism -TSH noted at 33.958 on 03/15/2022. -Continue Synthroid 50 mcg's daily.   -Will need repeat labs in 4 to 6 weeks in the outpatient setting.    6.  Hypophosphatemia/hypomagnesemia -Magnesium at 2.0.  -Phosphorus at 3.3. -Repletion per pharmacy.   -Repeat labs in the AM.    7.  Severe protein calorie malnutrition -Patient seen by dietitian, PICC line placed and patient started on TPN per pharmacy. -Albumin level noted at< 1.5 -IV albumin every 6 hours x 24 hours.   -Continue TPN for total of 10 days.  8.  Anemia of chronic disease/folate deficiency -Hemoglobin noted to go as low as 6.5, status post transfusion 1 unit packed red  blood cells.   Hemoglobin noted at 7.1 on 03/20/2022.  Status posttransfusion 2 units packed red blood cells hemoglobin currently at 8.8 from 10.1 (03/22/2022 ). 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   -Hemoglobin seems to be trickling back down.   -GI consulted for further evaluation and management to see if repeat EGD needed as well.   -Follow H&H, transfusion threshold hemoglobin  < 8.   9.  Right upper extremity swelling and pain -Clinical improvement.   -RUE Dopplers negative for DVT. -Keep right upper extremity elevated. -Patient started on diuretics.  10.  NSVT -Patient noted to have 7 beat run of NSVT on 03/19/2022, patient was asymptomatic.   -Electrolytes repleted potassium today currently at 4.4, magnesium at 2. -Hemoglobin noted at 7.1 on 03/20/2022, status post transfusion 2 units packed red blood cells hemoglobin currently at 8.8 and seems to be trending down.  -Repeat labs in the AM.     DVT prophylaxis: Heparin Code Status: Full Family Communication: Updated patient.  No family at bedside.  Disposition: SNF.  Status is: Inpatient Remains inpatient appropriate because: Severity of illness.  Unsafe disposition.   Consultants:  PCCM admission. Infectious disease: Dr. Thedore Mins 03/15/2022 Wound care RN 03/14/2022 Palliative care pending Gastroenterology: Dr. Myrtie Neither III 03/23/2022  Procedures:  Plain films of bilateral feet 03/13/2022 Chest x-ray 03/15/2022 MRI left foot and right foot 03/13/2022  PICC line placement 03/17/2022 Transfused 2 units packed red blood cells 03/20/2022 Transfuse 1 unit packed red blood cells 03/15/2022 Right upper extremity Dopplers 03/19/2022  Antimicrobials:  Anti-infectives (From admission, onward)    Start     Dose/Rate Route Frequency Ordered Stop   03/23/22 1215   Ampicillin-Sulbactam (UNASYN) 3 g in sodium chloride 0.9 % 100 mL IVPB        3 g 200 mL/hr over 30 Minutes Intravenous Every 6 hours 03/23/22 1118     03/19/22 2200  amoxicillin-clavulanate (AUGMENTIN) 875-125 MG per tablet 1 tablet        1 tablet Oral Every 12 hours 03/19/22 1115 03/22/22 2231   03/19/22 2200  doxycycline (VIBRA-TABS) tablet 100 mg        100 mg Oral Every 12 hours 03/19/22 1115 03/22/22 2230   03/14/22 2200  vancomycin (VANCOREADY) IVPB 500 mg/100 mL  Status:  Discontinued        500 mg 100 mL/hr over 60 Minutes Intravenous Every 24 hours 03/13/22 2049 03/19/22 1116   03/13/22 1915  vancomycin (VANCOCIN) IVPB 1000 mg/200 mL premix        1,000 mg 200 mL/hr over 60 Minutes Intravenous  Once 03/13/22 1908 03/14/22 0103   03/13/22 1615  cefTRIAXone (ROCEPHIN) 2 g in sodium chloride 0.9 % 100 mL IVPB        2 g 200 mL/hr over 30 Minutes Intravenous Every 24 hours 03/13/22 1603 03/19/22 1959         Subjective: Patient noted to have had a rough night overnight.  Noted to have a bout of nausea and emesis nonbloody with associated hypoxia and increased O2 requirements currently on 5 L nasal cannula was on room air yesterday.  Patient denies any chest pain.  Patient with some complaints of shortness of breath.  Denies any abdominal pain.   Objective: Vitals:   03/23/22 0746 03/23/22 0800 03/23/22 0852 03/23/22 1225  BP: (!) 157/94 (!) 177/93  (!) 158/81  Pulse: (!) 118 (!) 111 (!) 119   Resp: 20 18 20    Temp:    98.6 F (37 C)  TempSrc:    Oral  SpO2: 95% 95% 96%   Weight:      Height:        Intake/Output Summary (Last 24 hours) at 03/23/2022 1705 Last data filed at 03/23/2022 1609 Gross per 24 hour  Intake 2807.85 ml  Output 2005 ml  Net 802.85 ml   Filed Weights   03/21/22 0500 03/22/22 0500 03/23/22 0500  Weight: 55.6 kg 56.6 kg 58.5 kg    Examination:  General exam: Emaciated, cachectic, frail.  Respiratory system: Coarse rhonchorous gurgly breath  sounds noted diffusely.  No wheezing.  Fair air movement.  No significant use of accessory muscles of respiration.  Cardiovascular system: Tachycardia.  No murmurs rubs or gallops.  Positive JVD.  No lower extremity edema.  Gastrointestinal system: Abdomen soft, nontender, nondistended, positive bowel sounds.  No rebound.  No guarding.   Central nervous system: Alert and oriented.  Moving extremities spontaneously.  No focal neurological deficits.  Extremities: Bilateral hands, bilateral feet wrapped in gauze/Kerlix.  Right upper extremity with some swelling which has decreased, decreasing tenderness to palpation.  Skin: Erythema right upper extremity, severe desquamative dermatitis changes right upper extremity bilateral feet.  Psychiatry: Judgement and insight appear normal. Mood & affect appropriate.     Data Reviewed: I have personally reviewed following labs and imaging studies  CBC: Recent Labs  Lab  03/17/22 0920 03/18/22 1209 03/18/22 1508 03/19/22 1200 03/20/22 0409 03/21/22 0309 03/22/22 0234 03/23/22 0347  WBC 16.0* 12.7*  --  14.9* 11.6* 12.6* 13.3* 13.0*  NEUTROABS 13.2* 10.4*  --  12.3* 9.5*  --  10.6*  --   HGB 9.3* 6.8*   < > 7.5* 7.1* 10.0* 10.1* 8.8*  HCT 28.1* 20.9*   < > 23.0* 22.5* 30.5* 31.3* 28.1*  MCV 94.0 95.9  --  96.2 98.3 95.3 96.9 97.9  PLT 145* 127*  --  133* 126* 131* 148* 128*   < > = values in this interval not displayed.    Basic Metabolic Panel: Recent Labs  Lab 03/18/22 0411 03/19/22 0830 03/20/22 0409 03/21/22 0309 03/22/22 0234 03/23/22 0347  NA 128* 130* 133* 134* 131* 136  K 3.7 4.3 4.5 4.5 4.4 4.4  CL 101 101 103 105 102 104  CO2 23 25 25 25 23 25   GLUCOSE 199* 168* 228* 268* 303* 156*  BUN 17 19 22 23  24* 25*  CREATININE 0.65 0.64 0.61 0.59 0.70 0.43*  CALCIUM 6.5* 7.2* 7.1* 7.2* 7.5* 8.2*  MG 2.8* 1.9 1.7 2.0 1.8 2.0  PHOS 2.0* 2.7 2.9  --  2.7 3.3    GFR: Estimated Creatinine Clearance: 65.6 mL/min (A) (by C-G formula  based on SCr of 0.43 mg/dL (L)).  Liver Function Tests: Recent Labs  Lab 03/18/22 0411 03/22/22 0234  AST 29 30  ALT 20 17  ALKPHOS 182* 120  BILITOT 0.5 0.4  PROT 4.4* 4.8*  ALBUMIN <1.5* <1.5*    CBG: Recent Labs  Lab 03/22/22 1202 03/22/22 1758 03/23/22 0025 03/23/22 0638 03/23/22 1224  GLUCAP 172* 140* 171* 153* 152*     No results found for this or any previous visit (from the past 240 hour(s)).        Radiology Studies: DG ABD ACUTE 2+V W 1V CHEST  Result Date: 03/23/2022 CLINICAL DATA:  Hypoxia EXAM: DG ABDOMEN ACUTE WITH 1 VIEW CHEST COMPARISON:  Chest x-ray dated March 15, 2022 FINDINGS: Nonobstructive bowel-gas pattern. Hernia mesh of the upper abdomen. Cardiac and mediastinal contours are within normal limits. New bilateral patchy airspace opacities. New small bilateral pleural effusions. IMPRESSION: 1. New bilateral patchy airspace opacities, possibly due to multifocal infection or pulmonary edema. 2. Small bilateral pleural effusions. 3. Nonobstructive bowel-gas pattern. Electronically Signed   By: 05/22/2022 M.D.   On: 03/23/2022 09:27        Scheduled Meds:  (feeding supplement) PROSource Plus  30 mL Oral BID BM   acetaminophen  1,000 mg Oral TID   Chlorhexidine Gluconate Cloth  6 each Topical Daily   cholecalciferol  1,000 Units Oral Daily   dexamethasone (DECADRON) injection  8 mg Intravenous Q24H   famotidine  10 mg Oral BID   feeding supplement  237 mL Oral TID BM   furosemide  40 mg Intravenous Q12H   gabapentin  300 mg Oral TID    HYDROmorphone (DILAUDID) injection  2 mg Intravenous NOW   insulin aspart  0-20 Units Subcutaneous Q6H   levothyroxine  50 mcg Oral Q0600   liothyronine  25 mcg Oral Daily   lipase/protease/amylase  24,000 Units Oral TID WC   metoprolol tartrate  12.5 mg Oral BID   mupirocin cream   Topical Once   pantoprazole (PROTONIX) IV  40 mg Intravenous Q12H   pyridOXINE  100 mg Oral Daily   silver sulfADIAZINE    Topical Daily   sodium chloride flush  10-40 mL Intracatheter Q12H   vitamin A  50,000 Units Oral Daily   vitamin B-12  100 mcg Oral Daily   vitamin E  400 Units Oral Daily   zinc sulfate  220 mg Oral Daily   Continuous Infusions:  albumin human 25 g (03/23/22 1655)   ampicillin-sulbactam (UNASYN) IV Stopped (03/23/22 1343)   TPN ADULT (ION) 80 mL/hr at 03/23/22 1609   TPN ADULT (ION)       LOS: 9 days    Time spent: 40 minutes    Irine Seal, MD [facility]   To contact the attending provider between 7A-7P or the covering provider during after hours 7P-7A, please log into the web site [email or web address] and access using universal [place] password for that web site. If you do not have the password, please call the hospital operator.  03/23/2022, 5:05 PM

## 2022-03-23 NOTE — Progress Notes (Signed)
Nutrition Follow-up  DOCUMENTATION CODES:   Severe malnutrition in context of chronic illness, Underweight  INTERVENTION:   -TPN management per Pharmacy  -Continue 60 mcg Selenium   -Continue folic acid and Thiamine   -Supplement 100,000 units of Vitamin A x 3 days (complete) -followed by 50,000 units x 14 days -Recommend 5,000 units daily following repletion   -400 units Vitamin E daily -100 mg Vitamin B-6 daily x 30 days.     -Ensure Plus High Protein po BID, each supplement provides 350 kcal and 20 grams of protein as tolerated   -Prosource Plus PO BID, each provides 100 kcals and 15g protein   -Patient should continue Vitamin D and A supplementation at discharge as she has history of deficiency   -Daily weights while on TPN  NUTRITION DIAGNOSIS:   Severe Malnutrition related to chronic illness (chronic pancreatitis, h/o multiple gastric surgeries including gastric bypass) as evidenced by severe fat depletion, severe muscle depletion.  Ongoing.  GOAL:   Patient will meet greater than or equal to 90% of their needs  Meeting with TPN + some PO  MONITOR:   PO intake, Supplement acceptance, Weight trends, Labs, I & O's, Skin   ASSESSMENT:   67 year old female, past medical history of chronic pancreatitis, history of prior alcoholism, gastroesophageal reflux, prior vitamin D deficiency documented.  She has known malabsorption issues and has been on Creon.  She presents with cellulitis and sepsis related to a ongoing rash of the hands and feet. She presents to the ER with an elevated white count and was treated with antibiotics for concern of sepsis.  Patient consuming less of her meals, 0-15%. Drinking fluids. Accepting Ensure and Prosource supplements. GI following. Pt was having episodes of vomiting yesterday.  Labs for niacin returned, WNL. Vitamin C was not returned, issue with sample. Biotin lab returned low. MVI in TPN provides biotin, if TPN d/c will start PO  MVI with biotin.   TPN continues at 80 ml/hr, providing 1954 kcals and 99g protein.   Admission weight: 101 lbs Current weight: 128 lbs Per nursing documentation, pt with mild BUE and BLE edema. Moderate perineal edema. Severe sacral edema.  Medications: Vitamin D, Pepcid, Lasix, Creon, Vitamin A, Vitamin B-12, Vitamin E, Vitamin B-6, Zinc sulfate, Zofran  Labs reviewed: CBGs: 140-202   Diet Order:   Diet Order             Diet regular Room service appropriate? Yes; Fluid consistency: Thin  Diet effective now                   EDUCATION NEEDS:   Education needs have been addressed  Skin:  Skin Assessment: Skin Integrity Issues: Skin Integrity Issues:: Other (Comment) Other: red, flaky rashes to bilateral heels, feet and hands  Last BM:  1/8 -type 6  Height:   Ht Readings from Last 1 Encounters:  03/16/22 5\' 8"  (1.727 m)    Weight:   Wt Readings from Last 1 Encounters:  03/23/22 58.5 kg   BMI:  Body mass index is 19.61 kg/m.  Estimated Nutritional Needs:   Kcal:  1900-2100  Protein:  95-105g  Fluid:  2L/day  Clayton Bibles, MS, RD, LDN Inpatient Clinical Dietitian Contact information available via Amion

## 2022-03-23 NOTE — Progress Notes (Signed)
Per report, patient vomited last night around 2200 and O2 Sats decreased to 85-88% RA. Patient now on 5L/[place]. Patient states she vomited x2, no blood seen. Pt states vomit was liquid with some chunks of food, "like phlegm." VSS, HR 115, O2 Sats 95% 5L/[place]. Awaiting CXR at this time. Patient A&Ox4, moaning and crying out at times. Purewick replaced on patient for strict I&O and IV Lasix given as ordered. MD Grandville Silos, RR and RT made aware of change. Patient switched to [place] monitor for closer monitoring.

## 2022-03-23 NOTE — Progress Notes (Addendum)
PHARMACY - TOTAL PARENTERAL NUTRITION CONSULT NOTE   Indication:  chronic severe malnutrition with desquamating rash  Patient Measurements: Height: 5\' 8"  (172.7 cm) Weight: 58.5 kg (128 lb 15.5 oz) IBW/kg (Calculated) : 63.9 TPN AdjBW (KG): 46 Body mass index is 19.61 kg/m.  Assessment: 67 year old female with severe malnutrition related to chronic illness. Patient with desquamating rash likely related to significant nutritional/vitamin deficiencies. Pharmacy consulted to dose TPN to accelerate improvement in nutritional deficiencies. Initially planning for 7-10 days of TPN.  Glucose / Insulin: no hx diabetes. CBG goal <180. - CBGs range: 140 - 171, at goal after increasing SSI coverage - sSSI:  15 units / 24 hr on resistant SSI q6h Electrolytes:  CorrCa (10.6) slightly elevated; all other lytes WNL. Renal: SCr WNL, BUN slightly elevated but stable Hepatic: AST/ALT WNL, albumin low < 1.5 Intake / Output: Strict I/O not measured.  - UOP: 500 mL + 2 unmeasured; stool: 3 unmeasured - No mIVF GI Imaging: NA GI Surgeries / Procedures: NA  Central access: 1/3 TPN start date: 1/3  Nutritional Goals: Goal TPN rate is 80 mL/hr (provides 100 g of protein and 1954 kcals per day)  RD Assessment: Estimated Needs Total Energy Estimated Needs: 1900-2100 Total Protein Estimated Needs: 95-105g Total Fluid Estimated Needs: 2L/day  Current Nutrition: regular diet ordered, 15% meal intake charted Enteral supplements:  Ensure Plus TID, Prosource BID (all charted in past 24 hours)  Plan:   At 1800: Continue TPN at goal rate 80 mL/hr Electrolytes in TPN: Remove Ca Na 150 mEq/L, K 40 mEq/L, Ca 0 mEq/L, Mg 6 mEq/L, and Phos 15 mmol/L. Cl:Ac 1:1 Add standard MVI and trace elements to TPN (provides 60 mcg selenium, adequate per discussion with RD) Add thiamine 768 mg and folic acid 1 mg to TPN Continue CBG checks q6h + resistant SSI Monitor TPN labs on Mon/Thurs. Recheck electrolytes with AM  labs tomorrow.   Lenis Noon, PharmD 03/23/22 7:27 AM  Addendum: Discussed duration of TPN with TR[facility]. At this time, planning for a 10 day DOT.   Lenis Noon, PharmD 03/23/22 7:55 AM

## 2022-03-23 NOTE — Progress Notes (Signed)
Pharmacy Antibiotic Note  Carolyn [name] is a 67 y.o. female admitted on 03/13/2022. Pt completed a 10 day course of antibiotics on 03/22/21 for cellulitis. Pharmacy consulted to dose ampicillin/sulbactam starting 1/9 for aspiration PNA - now requiring oxygen.   Today, 03/23/22 WBC slightly elevated SCr WNL Afebrile  Plan: Ampicillin/sulbactam 3 g IV q6h  Renal function stable, no pending cultures. Pharmacy to sign off. Please re-consult if needed.   Height: 5\' 8"  (172.7 cm) Weight: 58.5 kg (128 lb 15.5 oz) IBW/kg (Calculated) : 63.9  Temp (24hrs), Avg:98.6 F (37 C), Min:98.3 F (36.8 C), Max:98.8 F (37.1 C)  Recent Labs  Lab 03/19/22 0830 03/19/22 1200 03/20/22 0409 03/21/22 0309 03/22/22 0234 03/23/22 0347  WBC  --  14.9* 11.6* 12.6* 13.3* 13.0*  CREATININE 0.64  --  0.61 0.59 0.70 0.43*    Estimated Creatinine Clearance: 65.6 mL/min (A) (by C-G formula based on SCr of 0.43 mg/dL (L)).    Allergies  Allergen Reactions   Peanut-Containing Drug Products Anaphylaxis    Tree nuts    Antimicrobials this admission: 1/9  ampicillin/sulbactam >>  1/5 Augmentin + doxy >> 1/8 12/30 vancomycin + ceftriaxone >> 1/4  Dose adjustments this admission:  Microbiology results: 12/30 BCx: ngF  Carolyn [name], PharmD 03/23/2022 12:14 PM

## 2022-03-23 NOTE — Consult Note (Addendum)
Consultation  Referring Provider: [facility]/ [place] Primary Care Physician:  Pcp, No Primary Gastroenterologist:  Dr.Beavers  Reason for Consultation: Anemia, with continued slow GI blood loss, drifting hemoglobin requiring 3 units of packed RBCs this admission.  HPI: Carolyn [name] is a 67 y.o. female with history of EtOH induced cirrhosis, chronic pancreatitis, with pancreatic insufficiency requiring Creon chronically.  Chronic low nutrition, chronic pain syndrome.  Established with Dr. Tarri Glenn in 2020, and not seen again until she was hospitalized in November 2023 and seen by GI for normocytic anemia heme positive stool without overt GI bleeding. She had presented at that time with complaints of blistering on the soles of her feet and hands and has since been managed for cellulitis. MELD = 6 at that time She underwent EGD on 01/19/2022 no esophageal varices noted, moderate portal gastropathy, 1 deep cratered 15 mm prepyloric ulcer with self-contained fistulous track at the base of the ulcer which appeared to communicate with the duodenal bulb.  Path showed no evidence of H. pylori, no dysplasia.  Plan was for her to stay on twice daily PPI for about 10 weeks, and to had also been started on Carafate 4 times daily with plans for repeat EGD in 10 to 12 weeks. She was seen back in the office by myself on 02/11/2022.  At that time had only been taking the Protonix once daily and this was increased to twice daily and she was continued on Carafate pension 4 times daily to complete her current prescription.  Also continued on Creon 12/36,00  2 p.o. with each meal. Scheduled for follow-up EGD with Dr. Tarri Glenn in early February. Unfortunately she required readmission on 03/13/2022 concern for sepsis.  She came back in with severe desquamative dermatitis and superimposed cellulitis of the right upper extremity and the feet.  Initially covered with broad-spectrum antibiotics and now narrowed to Augmentin and  doxycycline. Profoundly malnourished and started on TPN 03/16/2021. Patient has had progressive weakness and disability over the past couple of months, unable to ambulate on her own. MRI since admission has not shown any evidence of osteomyelitis.   Is required at least 3 units of packed RBCs since admission 10 days ago.  On admission had hemoglobin of 7.1 drifting to 6.5 and transfused up to 10.9, drop in hemoglobin again over the next few days requiring transfusion. On 03/21/2021 hemoglobin 10> 03/22/2021 hemoglobin 10.1 and today hemoglobin 8.8/hematocrit 28.1 WBC 13,000 BUN 25/creatinine 0.43 LFTs have been normal, albumin less than 1.5.  She has not had any overt evidence of GI bleeding.  When asked this morning she says that she has seen some dark red blood intermittently with wiping and occasionally in the commode.  Not aware of any melena. Overall she says she feels terrible, has ongoing pain in her extremities.  She has not had much appetite, is on a solid diet but has not been eating much.  She has fullness in her abdomen but denies any significant pain. Last evening she did have an episode of vomiting no blood noted at that time, around that same time became short of breath and dropped her O2 sats into the 80s.  She is now requiring nasal oxygen. Continue with IV Lasix and IV albumin  BNP today 3564  Chest x-ray PA and lateral with new bilateral patchy airspace opacities rule out infectious versus pulmonary edema  Continues to feel short of breath, denies any chest pain.   Note patient has listed as remote Roux-en-Y gastric bypass  in her record and prior cholecystectomy.  At the time of recent EGD in November 2023 with there was no evidence of a Roux-en-Y gastric bypass     Past Medical History:  Diagnosis Date   Alcoholic cirrhosis ([place])    Chronic pancreatitis ([place])    Endometriosis    GERD (gastroesophageal reflux disease)    Liver disease    Malabsorption    MALABSORPTION  SYNDROME   Osteoporosis 03/2011   t score -2.5   Pancreatitis    due to cyst and tumors due to calcifications   Seizure ([place])    no seizure disorder   Vitamin D deficiency 2012   VIT D 15    Past Surgical History:  Procedure Laterality Date   APPENDECTOMY     BIOPSY  01/19/2022   Procedure: BIOPSY;  Surgeon: Lavena Bullion, DO;  Location: [facility];  Service: Gastroenterology;;   CHOLECYSTECTOMY     ESOPHAGOGASTRODUODENOSCOPY (EGD) WITH PROPOFOL N/A 01/19/2022   Procedure: ESOPHAGOGASTRODUODENOSCOPY (EGD) WITH PROPOFOL;  Surgeon: Lavena Bullion, DO;  Location: [facility];  Service: Gastroenterology;  Laterality: N/A;   FEEDING TUBE RELOCATION  2010   JEJUNOSTOMY FEEDING TUBE  1990   MULTIPLE GASTRIC SURGERIES     MYOMECTOMY     OPEN REDUCTION INTERNAL FIXATION (ORIF) DISTAL RADIAL FRACTURE Left 06/29/2018   Procedure: OPEN REDUCTION INTERNAL FIXATION (ORIF)LEFT  DISTAL RADIAL FRACTURE;  Surgeon: Leanora Cover, MD;  Location: [facility];  Service: Orthopedics;  Laterality: Left;   ROUX-EN-Y GASTRIC BYPASS     TUBAL LIGATION      Prior to Admission medications   Medication Sig Start Date End Date Taking? Authorizing Provider  buprenorphine (BUTRANS) 20 MCG/HR PTWK Place 1 patch onto the skin once a week.   Yes [provider]  clobetasol ointment (TEMOVATE) [id number] % Apply 1 Application topically 2 (two) times daily. APPLY TO AFFECTED AREAS OF HANDS AND FEET   Yes [provider]  famotidine (PEPCID) 10 MG tablet Take 10 mg by mouth 2 (two) times daily.   Yes [provider]  feeding supplement (ENSURE ENLIVE / ENSURE PLUS) LIQD Take 237 mLs by mouth 3 (three) times daily between meals. 01/25/22  Yes Alma Friendly, MD  furosemide (LASIX) 40 MG tablet Take 1 tablet (40 mg total) by mouth daily. Patient taking differently: Take 40 mg by mouth daily as needed for fluid or edema. 02/18/22  Yes Thornton Park, MD  lidocaine  (XYLOCAINE) 2 % solution Use as directed 15 mLs in the mouth or throat every 6 (six) hours as needed for mouth pain. 09/05/20  Yes Cardama, Grayce Sessions, MD  lipase/protease/amylase (CREON) 12000-38000 units CPEP capsule Take 2 capsules (24,000 Units total) by mouth 3 (three) times daily with meals. 02/11/22  Yes Esterwood, Amy S, PA-C  Multiple Vitamin (MULITIVITAMIN WITH MINERALS) TABS Take 1 tablet by mouth daily.   Yes [provider]  ondansetron (ZOFRAN) 4 MG tablet Take 4 mg by mouth every 8 (eight) hours as needed for nausea or vomiting.   Yes [provider]  pantoprazole (PROTONIX) 40 MG tablet Take 1 tablet (40 mg total) by mouth 2 (two) times daily before a meal. 02/11/22  Yes Esterwood, Amy S, PA-C  polyethylene glycol (MIRALAX / GLYCOLAX) 17 g packet Take 17 g by mouth 2 (two) times daily as needed for mild constipation. 01/25/22  Yes Alma Friendly, MD  potassium chloride SA (KLOR-CON M) 20 MEQ tablet Take 1 tablet (20 mEq  total) by mouth 2 (two) times daily for 2 days. 02/18/22 03/13/22 Yes Thornton Park, MD  potassium chloride SA (KLOR-CON M) 20 MEQ tablet Take 20 mEq by mouth 2 (two) times daily.   Yes [provider]  silver sulfADIAZINE (SILVADENE) 1 % cream Apply topically 2 (two) times daily. Patient taking differently: Apply 1 Application topically 2 (two) times daily as needed (blister burns). 01/25/22  Yes Alma Friendly, MD  tiZANidine (ZANAFLEX) 2 MG tablet Take 1 tablet (2 mg total) by mouth every 12 (twelve) hours as needed for muscle spasms. 01/25/22  Yes Alma Friendly, MD  vitamin B-12 (CYANOCOBALAMIN) 100 MCG tablet Take 100 mcg by mouth daily.   Yes [provider]  zinc sulfate 220 (50 Zn) MG capsule Take 1 capsule (220 mg total) by mouth daily. 01/09/22  Yes Mercy Riding, MD  Hyoscyamine Sulfate SL (LEVSIN/SL) 0.125 MG SUBL Place 1 each under the tongue every 6(six) hours as needed. Patient not taking:  Reported on 03/14/2022 02/11/22   Esterwood, Amy S, PA-C  oxyCODONE-acetaminophen (PERCOCET/ROXICET) 5-325 MG tablet Take 1 tablet by mouth every 6 (six) hours as needed for severe pain. Patient not taking: Reported on 03/14/2022 02/28/22   Horton, Barbette Hair, MD  triamcinolone cream (KENALOG) 0.1 % Apply 1 Application topically 2 (two) times daily. Only to hands Patient not taking: Reported on 03/14/2022 02/28/22   Horton, Barbette Hair, MD  PROMETHAZINE HCL PO Take 4 mg by mouth.   06/06/11  [provider]  Ranitidine HCl (ZANTAC PO) Take by mouth.    06/06/11  [provider]    Current Facility-Administered Medications  Medication Dose Route Frequency Provider Last Rate Last Admin   (feeding supplement) PROSource Plus liquid 30 mL  30 mL Oral BID BM Hunsucker, Bonna Gains, MD   30 mL at 03/22/22 1305   acetaminophen (TYLENOL) tablet 1,000 mg  1,000 mg Oral TID Eugenie Filler, MD   1,000 mg at 03/22/22 2230   acetaminophen (TYLENOL) tablet 650 mg  650 mg Oral Q4H PRN Icard, Bradley L, DO   650 mg at 03/19/22 1055   albumin human 25 % solution 25 g  25 g Intravenous Q6H Eugenie Filler, MD 60 mL/hr at 03/23/22 0348 25 g at 03/23/22 0348   Chlorhexidine Gluconate Cloth 2 % PADS 6 each  6 each Topical Daily Eugenie Filler, MD   6 each at 03/22/22 1007   cholecalciferol (VITAMIN D3) 25 MCG (1000 UNIT) tablet 1,000 Units  1,000 Units Oral Daily Icard, Bradley L, DO   1,000 Units at 03/22/22 1003   docusate sodium (COLACE) capsule 100 mg  100 mg Oral BID PRN Icard, Bradley L, DO       enoxaparin (LOVENOX) injection 30 mg  30 mg Subcutaneous Q24H Eugenie Filler, MD   30 mg at 03/22/22 1006   famotidine (PEPCID) tablet 10 mg  10 mg Oral BID Carlisle Cater, PA-C   10 mg at 03/22/22 2230   feeding supplement (ENSURE ENLIVE / ENSURE PLUS) liquid 237 mL  237 mL Oral TID BM Zada Finders R, MD   237 mL at 03/22/22 2018   gabapentin (NEURONTIN) capsule 300 mg  300 mg Oral TID  Eugenie Filler, MD   300 mg at 03/22/22 2230   HYDROmorphone (DILAUDID) injection 2 mg  2 mg Intravenous Q4H PRN Eugenie Filler, MD   2 mg at 03/23/22 0547   insulin aspart (novoLOG) injection 0-20 Units  0-20 Units Subcutaneous Q6H Lenis Noon, [place]   3 Units at 03/23/22 1607   ipratropium-albuterol (DUONEB) 0.5-2.5 (3) MG/3ML nebulizer solution 3 mL  3 mL Nebulization Q4H PRN Kathryne Eriksson, NP   3 mL at 03/23/22 0115   levothyroxine (SYNTHROID) tablet 50 mcg  50 mcg Oral Q0600 Eugenie Filler, MD   50 mcg at 03/23/22 0549   lip balm (CARMEX) ointment   Topical PRN Eugenie Filler, MD       lipase/protease/amylase (CREON) capsule 24,000 Units  24,000 Units Oral TID WC Lenore Cordia, MD   24,000 Units at 03/22/22 1732   metoprolol tartrate (LOPRESSOR) tablet 12.5 mg  12.5 mg Oral BID Eugenie Filler, MD   12.5 mg at 03/22/22 2231   mupirocin cream (BACTROBAN) 2 %   Topical Once Beatty, Celeste A, PA-C       ondansetron (ZOFRAN) injection 4 mg  4 mg Intravenous Q6H PRN Carlisle Cater, PA-C   4 mg at 03/23/22 3710   oxyCODONE (Oxy IR/ROXICODONE) immediate release tablet 10 mg  10 mg Oral Q4H PRN Eugenie Filler, MD   10 mg at 03/23/22 6269   pantoprazole (PROTONIX) injection 40 mg  40 mg Intravenous Q12H Eugenie Filler, MD       polyethylene glycol (MIRALAX / GLYCOLAX) packet 17 g  17 g Oral Daily PRN Icard, Bradley L, DO   17 g at 03/19/22 2146   pyridOXINE (VITAMIN B6) tablet 100 mg  100 mg Oral Daily Eugenie Filler, MD   100 mg at 03/22/22 1005   silver sulfADIAZINE (SILVADENE) 1 % cream   Topical Daily [place] Donnamarie Poag, DO   Given by Other at 03/22/22 1007   sodium chloride flush (NS) 0.9 % injection 10-40 mL  10-40 mL Intracatheter Q12H Eugenie Filler, MD   10 mL at 03/22/22 1024   sodium chloride flush (NS) 0.9 % injection 10-40 mL  10-40 mL Intracatheter PRN Eugenie Filler, MD       tiZANidine (ZANAFLEX) tablet 2 mg  2 mg Oral Q12H PRN Carlisle Cater,  PA-C   2 mg at 03/21/22 0143   TPN ADULT (ION)   Intravenous Continuous TPN Lenis Noon, RPH 80 mL/hr at 03/22/22 1751 New Bag at 03/22/22 1751   TPN ADULT (ION)   Intravenous Continuous TPN Lenis Noon, RPH       vitamin A capsule 50,000 Units  50,000 Units Oral Daily Eugenie Filler, MD   50,000 Units at 03/22/22 0957   vitamin B-12 (CYANOCOBALAMIN) tablet 100 mcg  100 mcg Oral Daily Icard, Bradley L, DO   100 mcg at 03/22/22 4854   vitamin E capsule 400 Units  400 Units Oral Daily Eugenie Filler, MD   400 Units at 03/22/22 1004   zinc sulfate capsule 220 mg  220 mg Oral Daily Icard, Bradley L, DO   220 mg at 03/22/22 1004    Allergies as of 03/13/2022 - Review Complete 03/13/2022  Allergen Reaction Noted   Peanut-containing drug products Anaphylaxis 03/11/2011    Family History  Problem Relation Age of Onset   Cancer Father        BONE   Breast cancer Maternal Grandmother     Social History   Socioeconomic History   Marital status: Single    Spouse name: Not on file   Number of children: Not on file   Years of education: Not on file   Highest education  level: Not on file  Occupational History   Not on file  Tobacco Use   Smoking status: Never   Smokeless tobacco: Never  Substance and Sexual Activity   Alcohol use: Yes    Comment: occ ( none in several weeks)   Drug use: No   Sexual activity: Never    Partners: Male  Other Topics Concern   Not on file  Social History Narrative   Not on file   Social Determinants of Health   Financial Resource Strain: Not on file  Food Insecurity: No Food Insecurity (03/15/2022)   Hunger Vital Sign    Worried About Running Out of Food in the Last Year: Never true    Ran Out of Food in the Last Year: Never true  Transportation Needs: No Transportation Needs (03/15/2022)   PRAPARE - Hydrologist (Medical): No    Lack of Transportation (Non-Medical): No  Physical Activity: Not on file   Stress: Not on file  Social Connections: Not on file  Intimate Partner Violence: Not At Risk (03/15/2022)   Humiliation, Afraid, Rape, and Kick questionnaire    Fear of Current or Ex-Partner: No    Emotionally Abused: No    Physically Abused: No    Sexually Abused: No    Review of Systems: Pertinent positive and negative review of systems were noted in the above HPI section.  All other review of systems was otherwise negative.  Physical Exam: Vital signs in last 24 hours: Temp:  [98.3 F (36.8 C)-98.9 F (37.2 C)] 98.3 F (36.8 C) (01/09 0622) Pulse Rate:  [111-128] 119 (01/09 0852) Resp:  [16-20] 20 (01/09 0852) BP: (141-177)/(73-94) 177/93 (01/09 0800) SpO2:  [84 %-96 %] 96 % (01/09 0852) Weight:  [58.5 kg] 58.5 kg (01/09 0500) Last BM Date : 03/22/22 General:   Alert,  Well-developed, acute and chronically ill-appearing frail, cachectic older white female, pleasant and cooperative in NAD Head:  Normocephalic and atraumatic. Eyes:  Sclera clear, no icterus.   Conjunctiva pink. Ears:  Normal auditory acuity. Nose:  No deformity, discharge,  or lesions. Mouth:  No deformity or lesions.   Neck:  Supple; no masses or thyromegaly. Lungs: Rales and rhonchi bilaterally  heart: tachy   Regular rate and rhythm; no murmurs, clicks, rubs,  or gallops. Abdomen:  Soft, no focal tenderness, she has soft tissue edema rather diffusely in the lateral portions of the abdomen and lower abdomen no definite fluid wave Rectal:  not done Msk:  Symmetrical without gross deformities. . Pulses:  Normal pulses noted. Extremities: Right upper extremity and both feet are bandaged, some erythema into the right arm below the elbow Neurologic:  Alert and  oriented x4;  grossly normal neurologically. Skin:  Intact without significant lesions or rashes.. Psych:  Alert and cooperative. Normal mood and affect.  Intake/Output from previous day: 01/08 0701 - 01/09 0700 In: 2075 [P.O.:700; I.V.:960.2; IV  Piggyback:414.8] Out: 505 [Urine:502; Stool:3] Intake/Output this shift: Total I/O In: 240 [P.O.:240] Out: 1000 [Urine:1000]  Lab Results: Recent Labs    03/21/22 0309 03/22/22 0234 03/23/22 0347  WBC 12.6* 13.3* 13.0*  HGB 10.0* 10.1* 8.8*  HCT 30.5* 31.3* 28.1*  PLT 131* 148* 128*   BMET Recent Labs    03/21/22 0309 03/22/22 0234 03/23/22 0347  NA 134* 131* 136  K 4.5 4.4 4.4  CL 105 102 104  CO2 25 23 25   GLUCOSE 268* 303* 156*  BUN 23 24* 25*  CREATININE 0.59  0.70 0.43*  CALCIUM 7.2* 7.5* 8.2*   LFT Recent Labs    03/22/22 0234  PROT 4.8*  ALBUMIN <1.5*  AST 30  ALT 17  ALKPHOS 120  BILITOT 0.4   PT/INR No results for input(s): "LABPROT", "INR" in the last 72 hours. Hepatitis Panel No results for input(s): "HEPBSAG", "HCVAB", "HEPAIGM", "HEPBIGM" in the last 72 hours.   IMPRESSION:  #58 67 year old white female with decompensated alcohol induced cirrhosis, and chronic pancreatitis with progressive severe malnutrition, now requiring TPN  MELD-Na 6 range during last admission November 2023  #2 acute and chronic anemia with continued drift in hemoglobin during this admission requiring periodic transfusions.  #3 large prepyloric deep cratered ulcer documented at EGD 01/19/2022 with what was felt to be a self-contained fistulous tract communicating with the bulb.  Also noted to have moderate portal gastropathy at that time no varices  Suspect she is having continuous slow GI blood loss from portal gastropathy, and/or nonhealing of the cratered prepyloric ulcer  #4 status post cholecystectomy # 5.  Severe desquamative dermatitis changes of the right upper extremity and bilateral feet seeded with cellulitis.  Felt likely related to malnutrition/vitamin deficiency  #6 dyspnea, and drop in O2 sats yesterday now requiring oxygen Chest x-ray showing bilateral patchy airspace opacities infectious versus secondary to pulmonary edema BNP significantly  elevated Being managed with IV albumin and Lasix  PLAN: Agree with IV PPI twice daily .  Stop prophylactic Lovenox for now Okay to continue regular diet today Continue serial hemoglobins and transfuse keep hemoglobin in 8 range Continue TPN, she may require long-term Continue Creon with meals INR, prealbumin, AFP  She will need repeat EGD, hopefully later this week once pulmonary status is optimized.  EGD was discussed with the patient in detail including indications risk and benefits and she is agreeable to proceed.  She did not have any cross-sectional imaging during her admission in November at which time the self-contained perforation had been noted.  Will discuss but may be beneficial to obtain CT of the abdomen and pelvis prior to endoscopic evaluation.  Palliative care consultation is appropriate for goals of care   Amy Esterwood  PA-C1/11/2022, 9:50 AM  I have taken an interval history, thoroughly reviewed the chart and examined the patient. I agree with the Advanced Practitioner's note, impression and recommendations, and have recorded additional findings, impressions and recommendations below. I performed a substantive portion of this encounter (>50% time spent), including a complete performance of the medical decision making.  My additional thoughts are as follows:  History of gastric ulcer, last visualized inpatient EGD November 2023  Severe protein calorie malnutrition, history of alcohol induced pancreatitis, last alcohol use reported to be several months ago.  Current albumin less than 1.5, patient with anasarca which is also contributing to her volume overload/pulmonary edema.  Sounds like she may have had some microaspiration yesterday as well.  Multifactorial anemia, though her hemoglobin has been dropping, it is not clear that has been reported overt GI bleeding.  Endoscopic evaluation of the previously visualized ulcer is reasonable and should be done prior to  discharge given her medical complexity.  However, this must wait a few days for improvement in pulmonary status.   I agree with our PA, her degree of weight loss and protein calorie malnutrition warrants cross-sectional imaging, and I recommend a CT scan chest abdomen and pelvis prior to discharge.  It does not necessarily need to be done before the endoscopy.  Charlie Pitter III  Office:[phone]

## 2022-03-24 ENCOUNTER — Inpatient Hospital Stay (HOSPITAL_COMMUNITY): Payer: Medicare PPO

## 2022-03-24 ENCOUNTER — Other Ambulatory Visit: Payer: Medicare PPO

## 2022-03-24 DIAGNOSIS — K7030 Alcoholic cirrhosis of liver without ascites: Secondary | ICD-10-CM | POA: Diagnosis not present

## 2022-03-24 DIAGNOSIS — K860 Alcohol-induced chronic pancreatitis: Secondary | ICD-10-CM | POA: Diagnosis not present

## 2022-03-24 DIAGNOSIS — K861 Other chronic pancreatitis: Secondary | ICD-10-CM | POA: Diagnosis not present

## 2022-03-24 DIAGNOSIS — D500 Iron deficiency anemia secondary to blood loss (chronic): Secondary | ICD-10-CM | POA: Diagnosis not present

## 2022-03-24 DIAGNOSIS — L03116 Cellulitis of left lower limb: Secondary | ICD-10-CM | POA: Diagnosis not present

## 2022-03-24 DIAGNOSIS — D638 Anemia in other chronic diseases classified elsewhere: Secondary | ICD-10-CM | POA: Diagnosis not present

## 2022-03-24 DIAGNOSIS — R634 Abnormal weight loss: Secondary | ICD-10-CM | POA: Diagnosis not present

## 2022-03-24 DIAGNOSIS — K255 Chronic or unspecified gastric ulcer with perforation: Secondary | ICD-10-CM | POA: Diagnosis not present

## 2022-03-24 DIAGNOSIS — I5033 Acute on chronic diastolic (congestive) heart failure: Secondary | ICD-10-CM | POA: Diagnosis not present

## 2022-03-24 DIAGNOSIS — A419 Sepsis, unspecified organism: Secondary | ICD-10-CM | POA: Diagnosis not present

## 2022-03-24 LAB — CBC WITH DIFFERENTIAL/PLATELET
Abs Immature Granulocytes: 0.1 10*3/uL — ABNORMAL HIGH (ref 0.00–0.07)
Basophils Absolute: 0 10*3/uL (ref 0.0–0.1)
Basophils Relative: 0 %
Eosinophils Absolute: 0 10*3/uL (ref 0.0–0.5)
Eosinophils Relative: 0 %
HCT: 26.6 % — ABNORMAL LOW (ref 36.0–46.0)
Hemoglobin: 8.2 g/dL — ABNORMAL LOW (ref 12.0–15.0)
Immature Granulocytes: 1 %
Lymphocytes Relative: 5 %
Lymphs Abs: 0.5 10*3/uL — ABNORMAL LOW (ref 0.7–4.0)
MCH: 31.1 pg (ref 26.0–34.0)
MCHC: 30.8 g/dL (ref 30.0–36.0)
MCV: 100.8 fL — ABNORMAL HIGH (ref 80.0–100.0)
Monocytes Absolute: 0.3 10*3/uL (ref 0.1–1.0)
Monocytes Relative: 3 %
Neutro Abs: 8.5 10*3/uL — ABNORMAL HIGH (ref 1.7–7.7)
Neutrophils Relative %: 91 %
Platelets: 122 10*3/uL — ABNORMAL LOW (ref 150–400)
RBC: 2.64 MIL/uL — ABNORMAL LOW (ref 3.87–5.11)
RDW: 15.7 % — ABNORMAL HIGH (ref 11.5–15.5)
WBC: 9.4 10*3/uL (ref 4.0–10.5)
nRBC: 0 % (ref 0.0–0.2)

## 2022-03-24 LAB — BASIC METABOLIC PANEL
Anion gap: 9 (ref 5–15)
BUN: 31 mg/dL — ABNORMAL HIGH (ref 8–23)
CO2: 27 mmol/L (ref 22–32)
Calcium: 8.4 mg/dL — ABNORMAL LOW (ref 8.9–10.3)
Chloride: 102 mmol/L (ref 98–111)
Creatinine, Ser: 0.62 mg/dL (ref 0.44–1.00)
GFR, Estimated: >60 mL/min (ref >60)
Glucose, Bld: 251 mg/dL — ABNORMAL HIGH (ref 70–99)
Potassium: 4.1 mmol/L (ref 3.5–5.1)
Sodium: 138 mmol/L (ref 135–145)

## 2022-03-24 LAB — GLUCOSE, CAPILLARY
Glucose-Capillary: 212 mg/dL — ABNORMAL HIGH (ref 70–99)
Glucose-Capillary: 248 mg/dL — ABNORMAL HIGH (ref 70–99)
Glucose-Capillary: 284 mg/dL — ABNORMAL HIGH (ref 70–99)
Glucose-Capillary: 347 mg/dL — ABNORMAL HIGH (ref 70–99)

## 2022-03-24 LAB — OCCULT BLOOD X 1 CARD TO LAB, STOOL: Fecal Occult Bld: NEGATIVE

## 2022-03-24 LAB — PHOSPHORUS: Phosphorus: 4 mg/dL (ref 2.5–4.6)

## 2022-03-24 LAB — MAGNESIUM: Magnesium: 1.6 mg/dL — ABNORMAL LOW (ref 1.7–2.4)

## 2022-03-24 LAB — ANA W/REFLEX IF POSITIVE: Anti Nuclear Antibody (ANA): NEGATIVE

## 2022-03-24 LAB — T3, FREE: T3, Free: 2.8 pg/mL (ref 2.0–4.4)

## 2022-03-24 MED ORDER — IOHEXOL 9 MG/ML PO SOLN
ORAL | Status: AC
  Administered 2022-03-24: 500 mL
  Filled 2022-03-24: qty 1000

## 2022-03-24 MED ORDER — FENTANYL 25 MCG/HR TD PT72
1.0000 | MEDICATED_PATCH | TRANSDERMAL | Status: DC
  Administered 2022-03-24: 1 via TRANSDERMAL
  Filled 2022-03-24: qty 1

## 2022-03-24 MED ORDER — IOHEXOL 300 MG/ML  SOLN
100.0000 mL | Freq: Once | INTRAMUSCULAR | Status: AC | PRN
  Administered 2022-03-24: 100 mL via INTRAVENOUS

## 2022-03-24 MED ORDER — IOHEXOL 9 MG/ML PO SOLN
500.0000 mL | ORAL | Status: AC
  Administered 2022-03-24 (×2): 500 mL via ORAL

## 2022-03-24 MED ORDER — TRAVASOL 10 % IV SOLN
INTRAVENOUS | Status: AC
  Filled 2022-03-24: qty 998.4

## 2022-03-24 MED ORDER — DEXAMETHASONE SODIUM PHOSPHATE 10 MG/ML IJ SOLN: 8.0000 mg | INTRAMUSCULAR | Status: DC

## 2022-03-24 MED ORDER — MAGNESIUM SULFATE 2 GM/50ML IV SOLN
2.0000 g | Freq: Once | INTRAVENOUS | Status: AC
  Administered 2022-03-24: 2 g via INTRAVENOUS
  Filled 2022-03-24: qty 50

## 2022-03-24 MED ORDER — SODIUM CHLORIDE (PF) 0.9 % IJ SOLN
INTRAMUSCULAR | Status: AC
  Filled 2022-03-24: qty 50

## 2022-03-24 MED ORDER — HYDROMORPHONE HCL 1 MG/ML IJ SOLN
2.0000 mg | INTRAMUSCULAR | Status: DC | PRN
  Administered 2022-03-26 (×2): 2 mg via INTRAVENOUS
  Filled 2022-03-24 (×2): qty 2

## 2022-03-24 NOTE — Progress Notes (Signed)
Kim CT coordinated with RN to have pt down to CT around 2000. Pt did not come to CT. Transport called and RN messaged that she would have to come with transport. Transport arrived to floor and was told "we will take her down, I will coordinate with my tech". This tech informed RN that pt will have to drink an additional bottle of contrast if pt can not be scanned by 2230. RN saw message at 2127.

## 2022-03-24 NOTE — Progress Notes (Signed)
PROGRESS NOTE    Carolyn [name]  YQM:[id number] DOB: August 24, 1955 DOA: 03/13/2022 PCP: Pcp, No   Brief Narrative:  The patient is a 67 year old female, past medical history of chronic pancreatitis, history of prior alcoholism, gastroesophageal reflux, prior vitamin D deficiency documented. She has known malabsorption issues and has been on Creon. She presents with cellulitis and sepsis related to a ongoing rash of the hands and feet. It appears that the rash has been going on for several weeks continued to get worse. As started off with a small blister the blister then ruptured the skin started to dry out crack and flake. She is starting to flake and desquamate portions of the hands and feet. She developed increased pain and swelling in the right hand that developed redness that tracks into the forearm. She presents to the ER with an elevated white count and was treated with antibiotics for concern of sepsis.    **Interim History Patient was admitted by the critical care team and then subsequently transferred to the [facility] service on 01/14/2021 after she was stabilized.  She continues have significant pain and GI and palliative care been consulted.  Patient was transfused 3 units of PRBCs while she was hospitalized given her trending hemoglobin.  Care has now been adjusting her pain medications and IV Decadron.  GI is doing further workup and monitoring and CT scan of the abdomen chest and pelvis to evaluate for other causes of weight loss and ruling out malignancy.  Assessment and Plan:  Severe sepsis secondary to desquamative dermatitis with superimposed cellulitis and possible underlying osteomyelitis -Patient noted to have presented with complicated nonhealing desquamative dermatitis involving hands and feet complicated by severe sepsis due to cellulitis/wound infection. -MRI of bilateral hands and feet done without any definite evidence of osteomyelitis. -Patient initially admitted to the critical  care service, concern for probable vitamin/nutritional deficiencies versus paraneoplastic syndromes. -Concern for possible nutritional deficiencies, B2, B6, tryptophan, zinc, thiamine etc. could lead to this. -Patient noted to be prior alcoholic now with a chronic pancreatitis on Creon and likely has pre-existing condition of malabsorption as well as possible gastric surgery unsure as to what the patient has had this on not however per PCCM. -Patient placed empirically on IV vancomycin, IV Rocephin and subsequently transition to doxycycline and Augmentin and just completed a 10-day course of antibiotic treatment 03/22/2022 but then IV Unasyn was resumed given concern for possible Aspiration Pneumonia.  -Continue vitamin D3, vitamin B12, zinc, vitamin D3. -Continue vitamin A, selenium. -Will need follow-up with Dermatology (she is followed in [name]) -Will need outpatient follow-up with Podiatry for bone biopsy to evaluate for osteomyelitis. -Outpatient follow-up with ID which had been set for 03/22/2022. -Patient unable to ambulate seen by PT would likely require SNF placement. -Patient continues to have ongoing pain, causing tachycardia, continue current regimen and IV Dilaudid as needed. -Continue Neurontin 300 mg 3 times daily, scheduled Tylenol.  -Consulted palliative care for goals of care and symptom management and appreciate palliative involvement.  Patient is DNR and they are recommending medical interventions desired if medically indicated and appropriate. -Given the patient's uncontrolled pain palliative care is increased to hydromorphone to 2 mg every 2 as well as added a fentanyl patch 25 mcg and added phenobarbital for anxiety nightly and if she tolerates this regular schedule this as needed -Palliative is also added Decadron 8 mg IV daily as well as recommending continuing aggressive diuresis and continue antibiotics and checking T3, T4 as well as cortisol  level. -Continue supportive care and if she continues to drop her hemoglobin or develops acute GI bleeding however does not show any improvement in the next 24 to 40 hours then palliative will discuss hospice or end-of-life comfort care options   Acute Hypoxic respiratory failure likely secondary to acute on chronic diastolic CHF exacerbation +/- aspiration pneumonia -Patient noted to have a bout of nausea and emesis yesterday evening, noted to have increased O2 requirements currently on 5 L nasal cannula and was on room air 24 hours ago. -Chest x-ray done yesterday morning with new bilateral patchy airspace opacities possibly due to multifocal infection or pulmonary edema, small bilateral pleural effusions. -SpO2: 98 % O2 Flow Rate (L/min): 5 L/min -BNP noted to be significantly elevated at 3564.6 -Patient on IV albumin every 6 hours x 24 hours. -Placed on Lasix 40 mg IV every 12 hours, strict I's and O's, daily weights; she is still +10.111 L since admission -Placed empirically on IV Unasyn. -ECHO from 01/15/22 showed that the left ventricular ejection fraction was estimated to be 70 to 75% with left ventricular having hyperdynamic function in the left ventricle having no regional wall motion abnormalities.  There is mild left ventricular hypertrophy of the basal septal segment and the left ventricular diastolic parameters are consistent with grade 1 diastolic dysfunction -Repeat chest x-ray in 48 hours and if significant clinical improvement with diuresis would likely stop IV antibiotics at that time. -Continue monitor and trend respiratory status   History of chronic alcoholic pancreatitis -Continue Creon.    Hepatic cirrhosis -Being followed in the outpatient setting by [facility]. -BP soft and as such diuretics held spironolactone and Lasix held. -Patient with no hepatic encephalopathy, no evidence of abdominal ascites. -Outpatient follow-up with GI.   Gastric chronic ulcer with  contained perforation -Stable. 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   -Hemoglobin seems to be trickling back down again  -Continue PPI twice daily, Pepcid twice daily.  -Consulted with GI to see if repeat EGD needed, further evaluation and management. -GI recommends continuing PPI twice daily, TPN as well as obtaining another Hemoccult which was negative today. -Patient was able to eat a little bit today and feels that her Decadron hydromorphone is helping her. -GI is low suspicion for an actively bleeding gastric ulcer given that she has no overt bleeding and heme-negative stool -GI is ordering a CT Scan of the Chest/Abd/Pelvis to rule out malignancy or other sources of weight loss and this is pending to be done   Hypothyroidism -TSH noted at 33.958 on 03/15/2022. -Continue Synthroid 50 mcg's daily.   -Will need repeat labs in 4 to 6 weeks in the outpatient setting.     Hypophosphatemia Hypomagnesemia -Mag and Phos Trend: Recent Labs  Lab 03/18/22 0411 03/19/22 0830 03/20/22 0409 03/21/22 0309 03/22/22 0234 03/23/22 0347 03/24/22 0152  MG 2.8* 1.9 1.7 2.0 1.8 2.0 1.6*  PHOS 2.0* 2.7 2.9  --  2.7 3.3 4.0  -Repletion per pharmacy.   -Repeat labs in the AM.     Severe protein calorie malnutrition -Patient seen by dietitian, PICC line placed and patient started on TPN  per pharmacy. Nutrition Status: Nutrition Problem: Severe Malnutrition Etiology: chronic illness (chronic pancreatitis, h/o multiple gastric surgeries including gastric bypass) Signs/Symptoms: severe fat depletion, severe muscle depletion Interventions: Ensure Enlive (each supplement provides 350kcal and 20 grams of protein), MVI, Tube feeding, Prostat Albumin level noted at< 1.5 -IV albumin every 6 hours x 24 hours.   -Continuing TPN for total of 10 days.   Anemia of chronic disease/folate deficiency -Hemoglobin noted to go as low as 6.5, status post transfusion 1 unit packed red blood cells.   Hemoglobin noted at 7.1 on 03/20/2022.  Status posttransfusion 2 units packed red blood cells hemoglobin currently at 8.8 from 10.1 (03/22/2022 ). 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   -Hemoglobin seems to be trickling back down.   -Hgb/Hct Trend: Recent Labs  Lab 03/18/22 1508 03/19/22 1200 03/20/22 0409 03/21/22 0309 03/22/22 0234 03/23/22 0347 03/24/22 0152  HGB 7.3* 7.5* 7.1* 10.0* 10.1* 8.8* 8.2*  HCT 22.6* 23.0* 22.5* 30.5* 31.3* 28.1* 26.6*  MCV  --  96.2 98.3 95.3 96.9 97.9 100.8*  -GI consulted for further evaluation and management to see if repeat EGD needed as well.   -Follow H&H, transfusion threshold hemoglobin  < 8.    Right upper extremity swelling and pain -Clinical improvement.   -RUE Dopplers negative for DVT. -Keep right upper extremity elevated. -Patient started on diuretics.   NSVT -Patient noted to have 7 beat run of NSVT on 03/19/2022, patient was asymptomatic.   -Electrolytes repleted potassium today currently at 4.4, magnesium at 2. -Hemoglobin noted at 7.1 on 03/20/2022, status post transfusion 2 units packed red blood cells hemoglobin currently at 8.2 and seems to be trending down.   -Repeat labs in the AM.     DVT prophylaxis: Place and maintain sequential compression device Start: 03/23/22 1714 SCDs Start: 03/14/22 1801    Code Status: DNR Family Communication: No family currently at bedside  Disposition Plan:  Level of care: Telemetry Status is: Inpatient Remains inpatient appropriate because: Needs further clinical improvement prior to safe discharge disposition   Consultants:  Palliative care medicine PCCM transfer Infectious diseases Gastroenterology  Procedures:  As delineated as above  Antimicrobials:  Anti-infectives (From admission, onward)    Start     Dose/Rate Route Frequency Ordered Stop   03/23/22 1215  Ampicillin-Sulbactam (UNASYN) 3 g in sodium chloride 0.9 % 100 mL IVPB        3 g 200 mL/hr over 30 Minutes Intravenous Every 6 hours 03/23/22 1118     03/19/22 2200  amoxicillin-clavulanate (AUGMENTIN) 875-125 MG per tablet 1 tablet        1 tablet Oral Every 12 hours 03/19/22 1115 03/22/22 2231   03/19/22 2200  doxycycline (VIBRA-TABS) tablet 100 mg        100 mg Oral Every 12 hours 03/19/22 1115 03/22/22 2230   03/14/22 2200  vancomycin (VANCOREADY) IVPB 500 mg/100 mL  Status:  Discontinued        500 mg 100 mL/hr over 60 Minutes Intravenous Every 24 hours 03/13/22 2049 03/19/22 1116   03/13/22 1915  vancomycin (VANCOCIN) IVPB  1000 mg/200 mL premix        1,000 mg 200 mL/hr over 60 Minutes Intravenous  Once 03/13/22 1908 03/14/22 0103   03/13/22 1615  cefTRIAXone (ROCEPHIN) 2 g in sodium chloride 0.9 % 100 mL IVPB        2 g 200 mL/hr over 30 Minutes Intravenous Every 24 hours 03/13/22 1603 03/19/22 1959        Subjective: Seen and examined at bedside and was wearing supplemental oxygen complaining of significant swelling and complaining of significant pain.  States that she was not feeling as well today.  No nausea or vomiting.  No other concerns or complaints at this time.  Objective: Vitals:   03/24/22 0439 03/24/22 0500  03/24/22 0742 03/24/22 1202  BP:   (!) 169/87   Pulse:   96   Resp:   14   Temp: 97.7 F (36.5 C)   97.6 F (36.4 C)  TempSrc: Axillary   Oral  SpO2:   98%   Weight:  59.8 kg    Height:        Intake/Output Summary (Last 24 hours) at 03/24/2022 1739 Last data filed at 03/24/2022 1738 Gross per 24 hour  Intake 3444.12 ml  Output 2054 ml  Net 1390.12 ml   Filed Weights   03/22/22 0500 03/23/22 0500 03/24/22 0500  Weight: 56.6 kg 58.5 kg 59.8 kg   Examination: Physical Exam:  Constitutional: Thin cachectic chronically ill-appearing Caucasian female who appears uncomfortable Respiratory: Diminished to auscultation bilaterally with coarse breath sounds and had some crackles and some slight rhonchi.,  No appreciable wheezing or rales.. Normal respiratory effort and patient is not tachypenic. No accessory muscle use.  Wearing supplemental oxygen nasal cannula Cardiovascular: RRR, no murmurs / rubs / gallops. S1 and S2 auscultated.  Has 1+ upper extremity edema worse on the right arm compared to left and has some lower extremity edema as well Abdomen: Soft, non-tender, non-distended. Bowel sounds positive.  GU: Deferred. Musculoskeletal: No clubbing / cyanosis of digits/nails. No joint deformity upper and lower extremities.  Skin: Hand and feet are wrapped Neurologic: CN 2-12 grossly intact with no focal deficits. Romberg sign and cerebellar reflexes not assessed.  Psychiatric: Normal judgment and insight. Alert and oriented x 3.  Somewhat anxious mood and appropriate affect.   Data Reviewed: I have personally reviewed following labs and imaging studies  CBC: Recent Labs  Lab 03/18/22 1209 03/18/22 1508 03/19/22 1200 03/20/22 0409 03/21/22 0309 03/22/22 0234 03/23/22 0347 03/24/22 0152  WBC 12.7*  --  14.9* 11.6* 12.6* 13.3* 13.0* 9.4  NEUTROABS 10.4*  --  12.3* 9.5*  --  10.6*  --  8.5*  HGB 6.8*   < > 7.5* 7.1* 10.0* 10.1* 8.8* 8.2*  HCT 20.9*   < > 23.0* 22.5* 30.5*  31.3* 28.1* 26.6*  MCV 95.9  --  96.2 98.3 95.3 96.9 97.9 100.8*  PLT 127*  --  133* 126* 131* 148* 128* 122*   < > = values in this interval not displayed.   Basic Metabolic Panel: Recent Labs  Lab 03/19/22 0830 03/20/22 0409 03/21/22 0309 03/22/22 0234 03/23/22 0347 03/24/22 0152  NA 130* 133* 134* 131* 136 138  K 4.3 4.5 4.5 4.4 4.4 4.1  CL 101 103 105 102 104 102  CO2 25 25 25 23 25 27   GLUCOSE 168* 228* 268* 303* 156* 251*  BUN 19 22 23  24* 25* 31*  CREATININE 0.64 0.61 0.59 0.70 0.43* 0.62  CALCIUM 7.2* 7.1*  7.2* 7.5* 8.2* 8.4*  MG 1.9 1.7 2.0 1.8 2.0 1.6*  PHOS 2.7 2.9  --  2.7 3.3 4.0   GFR: Estimated Creatinine Clearance: 67.1 mL/min (by C-G formula based on SCr of 0.62 mg/dL). Liver Function Tests: Recent Labs  Lab 03/18/22 0411 03/22/22 0234  AST 29 30  ALT 20 17  ALKPHOS 182* 120  BILITOT 0.5 0.4  PROT 4.4* 4.8*  ALBUMIN <1.5* <1.5*   No results for input(s): "LIPASE", "AMYLASE" in the last 168 hours. No results for input(s): "AMMONIA" in the last 168 hours. Coagulation Profile: Recent Labs  Lab 03/23/22 1451  INR 1.3*   Cardiac Enzymes: No results for input(s): "CKTOTAL", "CKMB", "CKMBINDEX", "TROPONINI" in the last 168 hours. BNP (last 3 results) No results for input(s): "PROBNP" in the last 8760 hours. HbA1C: No results for input(s): "HGBA1C" in the last 72 hours. CBG: Recent Labs  Lab 03/23/22 1224 03/23/22 1741 03/23/22 2359 03/24/22 0636 03/24/22 1201  GLUCAP 152* 162* 284* 212* 248*   Lipid Profile: Recent Labs    03/22/22 0234  TRIG 62   Thyroid Function Tests: Recent Labs    03/23/22 1451  FREET4 1.07  T3FREE 2.8   Anemia Panel: No results for input(s): "VITAMINB12", "FOLATE", "FERRITIN", "TIBC", "IRON", "RETICCTPCT" in the last 72 hours. Sepsis Labs: No results for input(s): "PROCALCITON", "LATICACIDVEN" in the last 168 hours.  No results found for this or any previous visit (from the past 240 hour(s)).    Radiology Studies: DG ABD ACUTE 2+V W 1V CHEST  Result Date: 03/23/2022 CLINICAL DATA:  Hypoxia EXAM: DG ABDOMEN ACUTE WITH 1 VIEW CHEST COMPARISON:  Chest x-ray dated March 15, 2022 FINDINGS: Nonobstructive bowel-gas pattern. Hernia mesh of the upper abdomen. Cardiac and mediastinal contours are within normal limits. New bilateral patchy airspace opacities. New small bilateral pleural effusions. IMPRESSION: 1. New bilateral patchy airspace opacities, possibly due to multifocal infection or pulmonary edema. 2. Small bilateral pleural effusions. 3. Nonobstructive bowel-gas pattern. Electronically Signed   By: Yetta Glassman M.D.   On: 03/23/2022 09:27    Scheduled Meds:  (feeding supplement) PROSource Plus  30 mL Oral BID BM   acetaminophen  1,000 mg Oral TID   Chlorhexidine Gluconate Cloth  6 each Topical Daily   cholecalciferol  1,000 Units Oral Daily   dexamethasone (DECADRON) injection  8 mg Intravenous Q24H   famotidine  10 mg Oral BID   feeding supplement  237 mL Oral TID BM   fentaNYL  1 patch Transdermal Q72H   furosemide  40 mg Intravenous Q12H   gabapentin  300 mg Oral TID   insulin aspart  0-20 Units Subcutaneous Q6H   levothyroxine  50 mcg Oral Q0600   liothyronine  25 mcg Oral Daily   lipase/protease/amylase  24,000 Units Oral TID WC   metoprolol tartrate  12.5 mg Oral BID   mupirocin cream   Topical Once   pantoprazole (PROTONIX) IV  40 mg Intravenous Q12H   PHENObarbital  32.5 mg Intravenous QHS   pyridOXINE  100 mg Oral Daily   silver sulfADIAZINE   Topical Daily   sodium chloride flush  10-40 mL Intracatheter Q12H   vitamin A  50,000 Units Oral Daily   vitamin B-12  100 mcg Oral Daily   vitamin E  400 Units Oral Daily   zinc sulfate  220 mg Oral Daily   Continuous Infusions:  ampicillin-sulbactam (UNASYN) IV Stopped (03/24/22 1154)   TPN ADULT (ION) Stopped (03/24/22 1732)   TPN  ADULT (ION) 80 mL/hr at 03/24/22 1738    LOS: 10 days   [name] Elbe,  DO [facility] Available via Epic secure chat 7am-7pm After these hours, please refer to coverage provider listed on [email or web address] 03/24/2022, 5:39 PM

## 2022-03-24 NOTE — Progress Notes (Signed)
PT Cancellation Note  Patient Details Name: Carolyn [name] MRN: [id number] DOB: 1955/08/22   Cancelled Treatment:     cancel for imaging chest and ABD with contrast.  Will continue to follow.    Rica Koyanagi  PTA Acute  Rehabilitation Services Office M-F          [phone] Weekend pager [phone]

## 2022-03-24 NOTE — Progress Notes (Signed)
PHARMACY - TOTAL PARENTERAL NUTRITION CONSULT NOTE   Indication:  chronic severe malnutrition with desquamating rash  Patient Measurements: Height: 5\' 8"  (172.7 cm) Weight: 59.8 kg (131 lb 13.4 oz) IBW/kg (Calculated) : 63.9 TPN AdjBW (KG): 46 Body mass index is 20.05 kg/m.  Assessment: 67 year old female with severe malnutrition related to chronic illness. Patient with desquamating rash likely related to significant nutritional/vitamin deficiencies. Pharmacy consulted to dose TPN to accelerate improvement in nutritional deficiencies. Initially planning for 7-10 days of TPN.  Glucose / Insulin: no hx diabetes. CBG goal <150. - CBGs range: 152 - 284, elevated after dexamethasone 8 mg IV q24h started - sSSI: 26 units / 24 hr on resistant SSI q6h Electrolytes: Mg (1.6) low; CorrCa (10.4) remains slightly elevated after removing Ca from TPN. All other lytes WNL. Renal: SCr WNL, BUN slightly elevated Hepatic: AST/ALT WNL, albumin low < 1.5 Intake / Output:  - UOP: 3850 mL; stool: none charted - No mIVF GI Imaging: NA GI Surgeries / Procedures: NA  Central access: 1/3 TPN start date: 1/3  Nutritional Goals: Goal TPN rate is 80 mL/hr (provides 100 g of protein and 1954 kcals per day)  RD Assessment: Estimated Needs Total Energy Estimated Needs: 1900-2100 Total Protein Estimated Needs: 95-105g Total Fluid Estimated Needs: 2L/day  Current Nutrition: regular diet ordered, poor PO intake Enteral supplements:  Ensure Plus TID, Prosource BID (pt had two Ensure, 1 Prosource yesterday)  Plan:  Per discussion with [facility] on 1/9, planning for a 10 day duration of TPN. Today is day #7.  At 1800: Continue TPN at goal rate 80 mL/hr Electrolytes in TPN: Increase Mg Na 150 mEq/L, K 40 mEq/L, Ca 0 mEq/L, Mg 8 mEq/L, and Phos 15 mmol/L. Cl:Ac 1:1 Add standard MVI and trace elements to TPN (provides 60 mcg selenium, adequate per discussion with RD) Add thiamine 013 mg and folic acid 1 mg to  TPN Add 15 units of insulin to TPN. Continue CBG checks q6h + resistant SSI Monitor TPN labs on Mon/Thurs.   Lenis Noon, PharmD 03/24/22 7:43 AM

## 2022-03-24 NOTE — Progress Notes (Addendum)
Patient ID: Carolyn [name], female   DOB: 09/09/1955, 67 y.o.   MRN: [id number]    Progress Note   Subjective   Day # 12 CC; anemia, continued slow GI blood loss requiring intermittent transfusions  this admission. Recent EGD 01/2022 with finding of portal gastropathy , no esophageal varices and a large cratered 15 mm prepyloric ulcer with self-contained fistulous track at the base of the ulcer which appeared to communicate with the duodenal bulb.  PPI twice daily TPN Hemoccult negative today AFP pending BUN 31/creatinine 0.62 WBC 9.4/hemoglobin 8.2/hematocrit 26.6   HGb- down from 10.1  2 days ago  Patient much brighter and communicative today, smiling, husband at bedside-still short of breath, no complaints of pain was able to eat some solid food today. Her seen by palliative care yesterday her hydromorphone was increased and added Decadron daily-expect both of these are helping  Objective   Vital signs in last 24 hours: Temp:  [97.5 F (36.4 C)-98.6 F (37 C)] 97.7 F (36.5 C) (01/10 0439) Pulse Rate:  [96-113] 96 (01/10 0742) Resp:  [11-18] 14 (01/10 0742) BP: (157-202)/(81-125) 169/87 (01/10 0742) SpO2:  [94 %-98 %] 98 % (01/10 0742) Weight:  [59.8 kg] 59.8 kg (01/10 0500) Last BM Date : 03/23/22 General:   Older white female in NAD, husband at bedside  remains on O2 Heart:  Regular rate and rhythm; no murmurs Lungs: Bilateral Rales, scattered rhonchi Abdomen:  Soft, nontender and nondistended. Normal bowel sounds. Extremities: Puffy and edematous, hands and feet no change Neurologic:  Alert and oriented,  grossly normal neurologically. Psych:  Cooperative. Normal mood and affect.  Intake/Output from previous day: 01/09 0701 - 01/10 0700 In: 2905.1 [P.O.:240; I.V.:1823; IV Piggyback:842.1] Out: 9163 [Urine:3850] Intake/Output this shift: Total I/O In: -  Out: 3 [Urine:1; Stool:2]  Lab Results: Recent Labs    03/22/22 0234 03/23/22 0347 03/24/22 0152  WBC  13.3* 13.0* 9.4  HGB 10.1* 8.8* 8.2*  HCT 31.3* 28.1* 26.6*  PLT 148* 128* 122*   BMET Recent Labs    03/22/22 0234 03/23/22 0347 03/24/22 0152  NA 131* 136 138  K 4.4 4.4 4.1  CL 102 104 102  CO2 23 25 27   GLUCOSE 303* 156* 251*  BUN 24* 25* 31*  CREATININE 0.70 0.43* 0.62  CALCIUM 7.5* 8.2* 8.4*   LFT Recent Labs    03/22/22 0234  PROT 4.8*  ALBUMIN <1.5*  AST 30  ALT 17  ALKPHOS 120  BILITOT 0.4   PT/INR Recent Labs    03/23/22 1451  LABPROT 16.4*  INR 1.3*   FOBT negative   Studies/Results: DG ABD ACUTE 2+V W 1V CHEST  Result Date: 03/23/2022 CLINICAL DATA:  Hypoxia EXAM: DG ABDOMEN ACUTE WITH 1 VIEW CHEST COMPARISON:  Chest x-ray dated March 15, 2022 FINDINGS: Nonobstructive bowel-gas pattern. Hernia mesh of the upper abdomen. Cardiac and mediastinal contours are within normal limits. New bilateral patchy airspace opacities. New small bilateral pleural effusions. IMPRESSION: 1. New bilateral patchy airspace opacities, possibly due to multifocal infection or pulmonary edema. 2. Small bilateral pleural effusions. 3. Nonobstructive bowel-gas pattern. Electronically Signed   By: Yetta Glassman M.D.   On: 03/23/2022 09:27       Assessment / Plan:    #48 67 year old female with decompensated alcohol induced cirrhosis, chronic pancreatitis, and progressively severe malnutrition now requiring TPN  MELD =9  #2 acute and chronic anemia with continued drift in hemoglobin, stool heme-negative today required transfusion x 3 this admission  #  3 large prepyloric deep cratered ulcer documented at EGD 01/19/2022 with what was felt to be a self-contained fistulous tract communicating with the bulb also noted moderate portal gastropathy at that time no varices  Suspect she has had slow intermittent GI blood loss from combination of portal gastropathy and/or nonhealing of the cratered prepyloric ulcer in setting of severe malnutrition  4 status post cholecystectomy #5  severe desquamative dermatitis- extremities-finishing antibiotics for associated cellulitis Ealing of the hands and feet felt likely secondary to malnutrition/vitamin deficiencies  #6 new dyspnea and drop in O2 sats 03/22/2022- Remains on oxygen, comfortable today She may have aspirated with episode of vomiting at that time but definitely has component of pulmonary edema with significantly elevated BNP Hospitalist managing with IV albumin and Lasix  Plan; no change from GI perspective today IV PPI twice daily Serial hemoglobins and transfuse as indicated Continue TPN Continue Creon with meals  And consider EGD at some point during this hospitalization, not appropriate with current pulmonary status. Consider CT abdomen and pelvis tomorrow or Friday.  Per palliative care notes plan is to continue aggressive management but with liberal pain medication focus, and if no improvement in the next couple of days then consider transition to hospice      Principal Problem:   Severe sepsis ([place]) Active Problems:   Severe protein-calorie malnutrition ([place])   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[place])   Acute respiratory failure with hypoxia (H[facility])   Chronic gastric ulcer with perforation (H[facility])   Desquamative dermatitis   Sepsis ([place])   Cellulitis of left lower extremity   Hypophosphatemia   Anemia of chronic disease   Localized swelling of right upper extremity   NSVT (nonsustained ventricular tachycardia) (HCC)   History of gastric ulcer   Abnormal loss of weight   Aspiration pneumonia of both lungs (HCC)   Acute on chronic diastolic CHF (congestive heart failure) ([place])     LOS: 10 days   Amy Esterwood PA-C 03/24/2022, 11:05 AM  I have taken an interval history, thoroughly reviewed the chart and examined the patient. I agree with the Advanced Practitioner's note, impression and recommendations, and have recorded additional findings, impressions and recommendations below. I performed a  substantive portion of this encounter (>50% time spent), including a complete performance of the medical decision making.  My additional thoughts are as follows:  Hemoglobin has declined again without overt GI bleeding and heme-negative stool.  This is clearly multifactorial in medically complex and malnourished patient. Low suspicion for actively bleeding gastric ulcer. Based on her overall poor condition and pulmonary congestion (congested with end expiratory wheezing on exam today), not clear when or if she will be able to have upper endoscopy.  That would clearly no longer be indicated if she transitions to hospice.  I still feel CT scan chest abdomen and pelvis would be helpful to rule out malignancy or other obvious sources weight loss to help with overall decision making on her care.  We will order it.   Nelida Meuse III Office:[phone]

## 2022-03-25 DIAGNOSIS — K255 Chronic or unspecified gastric ulcer with perforation: Secondary | ICD-10-CM | POA: Diagnosis not present

## 2022-03-25 DIAGNOSIS — K861 Other chronic pancreatitis: Secondary | ICD-10-CM | POA: Diagnosis not present

## 2022-03-25 DIAGNOSIS — K860 Alcohol-induced chronic pancreatitis: Secondary | ICD-10-CM | POA: Diagnosis not present

## 2022-03-25 DIAGNOSIS — E43 Unspecified severe protein-calorie malnutrition: Secondary | ICD-10-CM | POA: Diagnosis not present

## 2022-03-25 DIAGNOSIS — D500 Iron deficiency anemia secondary to blood loss (chronic): Secondary | ICD-10-CM | POA: Diagnosis not present

## 2022-03-25 DIAGNOSIS — R634 Abnormal weight loss: Secondary | ICD-10-CM | POA: Diagnosis not present

## 2022-03-25 DIAGNOSIS — D638 Anemia in other chronic diseases classified elsewhere: Secondary | ICD-10-CM | POA: Diagnosis not present

## 2022-03-25 DIAGNOSIS — K7030 Alcoholic cirrhosis of liver without ascites: Secondary | ICD-10-CM | POA: Diagnosis not present

## 2022-03-25 LAB — COMPREHENSIVE METABOLIC PANEL
ALT: 22 U/L (ref 0–44)
AST: 33 U/L (ref 15–41)
Albumin: 3.3 g/dL — ABNORMAL LOW (ref 3.5–5.0)
Alkaline Phosphatase: 86 U/L (ref 38–126)
Anion gap: 9 (ref 5–15)
BUN: 54 mg/dL — ABNORMAL HIGH (ref 8–23)
CO2: 28 mmol/L (ref 22–32)
Calcium: 8.7 mg/dL — ABNORMAL LOW (ref 8.9–10.3)
Chloride: 102 mmol/L (ref 98–111)
Creatinine, Ser: 0.77 mg/dL (ref 0.44–1.00)
GFR, Estimated: >60 mL/min (ref >60)
Glucose, Bld: 230 mg/dL — ABNORMAL HIGH (ref 70–99)
Potassium: 4.2 mmol/L (ref 3.5–5.1)
Sodium: 139 mmol/L (ref 135–145)
Total Bilirubin: 0.3 mg/dL (ref 0.3–1.2)
Total Protein: 6.2 g/dL — ABNORMAL LOW (ref 6.5–8.1)

## 2022-03-25 LAB — CBC WITH DIFFERENTIAL/PLATELET
Abs Immature Granulocytes: 0.17 10*3/uL — ABNORMAL HIGH (ref 0.00–0.07)
Basophils Absolute: 0 10*3/uL (ref 0.0–0.1)
Basophils Relative: 0 %
Eosinophils Absolute: 0 10*3/uL (ref 0.0–0.5)
Eosinophils Relative: 0 %
HCT: 26.6 % — ABNORMAL LOW (ref 36.0–46.0)
Hemoglobin: 8.1 g/dL — ABNORMAL LOW (ref 12.0–15.0)
Immature Granulocytes: 1 %
Lymphocytes Relative: 6 %
Lymphs Abs: 0.7 10*3/uL (ref 0.7–4.0)
MCH: 31 pg (ref 26.0–34.0)
MCHC: 30.5 g/dL (ref 30.0–36.0)
MCV: 101.9 fL — ABNORMAL HIGH (ref 80.0–100.0)
Monocytes Absolute: 0.8 10*3/uL (ref 0.1–1.0)
Monocytes Relative: 7 %
Neutro Abs: 10.1 10*3/uL — ABNORMAL HIGH (ref 1.7–7.7)
Neutrophils Relative %: 86 %
Platelets: 157 10*3/uL (ref 150–400)
RBC: 2.61 MIL/uL — ABNORMAL LOW (ref 3.87–5.11)
RDW: 15.5 % (ref 11.5–15.5)
WBC: 11.8 10*3/uL — ABNORMAL HIGH (ref 4.0–10.5)
nRBC: 0 % (ref 0.0–0.2)

## 2022-03-25 LAB — GLUCOSE, CAPILLARY
Glucose-Capillary: 137 mg/dL — ABNORMAL HIGH (ref 70–99)
Glucose-Capillary: 158 mg/dL — ABNORMAL HIGH (ref 70–99)
Glucose-Capillary: 232 mg/dL — ABNORMAL HIGH (ref 70–99)
Glucose-Capillary: 237 mg/dL — ABNORMAL HIGH (ref 70–99)

## 2022-03-25 LAB — AFP TUMOR MARKER: AFP, Serum, Tumor Marker: 3.5 ng/mL (ref 0.0–9.2)

## 2022-03-25 LAB — PHOSPHORUS: Phosphorus: 5 mg/dL — ABNORMAL HIGH (ref 2.5–4.6)

## 2022-03-25 LAB — MAGNESIUM: Magnesium: 2.4 mg/dL (ref 1.7–2.4)

## 2022-03-25 MED ORDER — LEVALBUTEROL HCL 0.63 MG/3ML IN NEBU
0.6300 mg | INHALATION_SOLUTION | Freq: Four times a day (QID) | RESPIRATORY_TRACT | Status: DC
  Administered 2022-03-25 – 2022-03-26 (×6): 0.63 mg via RESPIRATORY_TRACT
  Filled 2022-03-25 (×6): qty 3

## 2022-03-25 MED ORDER — TRAVASOL 10 % IV SOLN
INTRAVENOUS | Status: AC
  Filled 2022-03-25: qty 998.4

## 2022-03-25 MED ORDER — FUROSEMIDE 10 MG/ML IJ SOLN
60.0000 mg | Freq: Two times a day (BID) | INTRAMUSCULAR | Status: DC
  Administered 2022-03-26 – 2022-03-29 (×7): 60 mg via INTRAVENOUS
  Filled 2022-03-25 (×7): qty 6

## 2022-03-25 MED ORDER — IPRATROPIUM BROMIDE 0.02 % IN SOLN
0.5000 mg | Freq: Four times a day (QID) | RESPIRATORY_TRACT | Status: DC
  Administered 2022-03-25 – 2022-03-26 (×6): 0.5 mg via RESPIRATORY_TRACT
  Filled 2022-03-25 (×6): qty 2.5

## 2022-03-25 MED ORDER — HYDRALAZINE HCL 20 MG/ML IJ SOLN
10.0000 mg | Freq: Four times a day (QID) | INTRAMUSCULAR | Status: DC | PRN
  Administered 2022-03-25: 10 mg via INTRAVENOUS
  Filled 2022-03-25: qty 1

## 2022-03-25 NOTE — Progress Notes (Signed)
   03/25/22 1100  Assess: MEWS Score  Temp 98 F (36.7 C)  BP (!) 172/101 (in pain, will recheck)  MAP (mmHg) 122  Pulse Rate (!) 107  ECG Heart Rate (!) 107  Resp (!) 9  SpO2 97 %  O2 Device Nasal Cannula  O2 Flow Rate (L/min) 5 L/min  Assess: MEWS Score  MEWS Temp 0  MEWS Systolic 0  MEWS Pulse 1  MEWS RR 1  MEWS LOC 0  MEWS Score 2  MEWS Score Color Yellow  Assess: if the MEWS score is Yellow or Red  Were vital signs taken at a resting state? Yes  Focused Assessment No change from prior assessment  Does the patient meet 2 or more of the SIRS criteria? No  MEWS guidelines implemented *See Row Information* No, previously yellow, continue vital signs every 4 hours  Assess: SIRS CRITERIA  SIRS Temperature  0  SIRS Pulse 1  SIRS Respirations  0  SIRS WBC 0  SIRS Score Sum  1   Previously yellow, will continue q 4 vitals

## 2022-03-25 NOTE — Progress Notes (Addendum)
[facility] Progress Note  CC:  anemia, continued slow GI blood loss requiring intermittent transfusions this admission   Subjective:  Patient talkative today.  Husband not at bedside.  She is somewhat tearful.  Breakfast tray in front of her.  No new complaints.  No abdominal pain.  Objective:  Vital signs in last 24 hours: Temp:  [97.6 F (36.4 C)-98.3 F (36.8 C)] 98 F (36.7 C) (01/11 0630) Pulse Rate:  [106-112] 106 (01/11 0935) Resp:  [6-12] 6 (01/11 0935) BP: (151-169)/(86-98) 151/86 (01/11 0841) SpO2:  [96 %-97 %] 97 % (01/11 0935) Weight:  [61.7 kg] 61.7 kg (01/11 0120) Last BM Date : 03/25/22 General:  Older white female in NAD Heart:  Slightly tahcy; no murmurs Pulm:  Coarse BS noted B/L.  Decreased movement on the right. Abdomen:  Soft, non-distended.  BS present.  Non-tender.  Extremities:  Puffy and edematous hands and feet wrapped with dry peeling skin. Neurologic:  Alert and oriented x 4;  grossly normal neurologically. Psych:  Alert and cooperative. Normal mood and affect.  Intake/Output from previous day: 01/10 0701 - 01/11 0700 In: 3539.9 [P.O.:1140; I.V.:1999.9; IV Piggyback:400] Out: 1204 [Urine:1201; Stool:3]  Lab Results: Recent Labs    03/23/22 0347 03/24/22 0152 03/25/22 0400  WBC 13.0* 9.4 11.8*  HGB 8.8* 8.2* 8.1*  HCT 28.1* 26.6* 26.6*  PLT 128* 122* 157   BMET Recent Labs    03/23/22 0347 03/24/22 0152 03/25/22 0400  NA 136 138 139  K 4.4 4.1 4.2  CL 104 102 102  CO2 25 27 28   GLUCOSE 156* 251* 230*  BUN 25* 31* 54*  CREATININE 0.43* 0.62 0.77  CALCIUM 8.2* 8.4* 8.7*   LFT Recent Labs    03/25/22 0400  PROT 6.2*  ALBUMIN 3.3*  AST 33  ALT 22  ALKPHOS 86  BILITOT 0.3   PT/INR Recent Labs    03/23/22 1451  LABPROT 16.4*  INR 1.3*   CT CHEST ABDOMEN PELVIS W CONTRAST  Result Date: 03/24/2022 CLINICAL DATA:  Weight loss, unintended EXAM: CT CHEST, ABDOMEN, AND PELVIS WITH CONTRAST TECHNIQUE:  Multidetector CT imaging of the chest, abdomen and pelvis was performed following the standard protocol during bolus administration of intravenous contrast. RADIATION DOSE REDUCTION: This exam was performed according to the departmental dose-optimization program which includes automated exposure control, adjustment of the mA and/or kV according to patient size and/or use of iterative reconstruction technique. CONTRAST:  162mL OMNIPAQUE IOHEXOL 300 MG/ML  SOLN COMPARISON:  CT abdomen pelvis 11/03/2012 FINDINGS: CT CHEST FINDINGS Cardiovascular: No significant coronary artery calcification. Global cardiac size within normal limits. No pericardial effusion. Central pulmonary arteries are of normal caliber. Mild atherosclerotic calcification within the thoracic aorta. No aortic aneurysm. Left upper extremity PICC line tip noted at the superior cavoatrial junction. Mediastinum/Nodes: There is diffuse mild infiltration of the mediastinal fat in keeping with edema. No loculated fluid collections are identified, however. Visualized thyroid is unremarkable. No pathologic thoracic adenopathy. Esophagus unremarkable. Lungs/Pleura: Multifocal ground-glass pulmonary infiltrates are present, new from chest radiograph of 03/15/2022, more prevalent within the lung apices bilaterally, possibly related to atypical infection in the acute setting. Large right and small left pleural effusions have developed. Subtotal collapse of the right lower lobe. No pneumothorax. No central obstructing lesion. Musculoskeletal: Healed bilateral rib fractures are noted. No acute bone abnormality. No lytic or blastic bone lesion. CT ABDOMEN PELVIS FINDINGS Hepatobiliary: Mild hepatic steatosis. No enhancing intrahepatic mass. No intra or extrahepatic biliary ductal  dilation. Status post cholecystectomy. Pancreas: The pancreas is markedly atrophic and there are scattered parenchymal calcifications seen throughout the residual pancreas in keeping with  changes of chronic pancreatitis. No definite superimposed acute peripancreatic inflammatory changes. No loculated peripancreatic fluid collections are seen. Spleen: Normal in size without focal abnormality. Adrenals/Urinary Tract: Adrenal glands are unremarkable. Kidneys are normal, without renal calculi, focal lesion, or hydronephrosis. Bladder is unremarkable. Stomach/Bowel: Numerous surgical clips are seen adjacent to the gastric antrum. Small bowel anastomotic staple lines are noted within the upper quadrants bilaterally. Adjacent to this region, within the subhepatic space is a small amount of extraluminal gas, best seen on axial image # 63/3 and coronal image # 30/6. The source is not clearly identified and there is no extraluminal extension of oral contrast seen. The stomach, small bowel, and large bowel are otherwise unremarkable. No evidence of obstruction or focal inflammation. The appendix is not visualized and is likely absent. Mild ascites is present. Vascular/Lymphatic: Mild atherosclerotic calcification within the supra celiac aorta. The abdominal vasculature is otherwise unremarkable. No pathologic adenopathy within the abdomen and pelvis. Reproductive: Uterus and bilateral adnexa are unremarkable. Other: There is diffuse severe subcutaneous body wall edema noted. No abdominal wall hernia. Musculoskeletal: No acute bone abnormality. No lytic or blastic bone lesion. IMPRESSION: 1. Multifocal ground-glass pulmonary infiltrates, new from chest radiograph of 03/15/2022, more prevalent within the lung apices bilaterally, possibly related to atypical infection in the acute setting. 2. Large right and small left pleural effusions. Marked diffuse subcutaneous body wall edema. Mild ascites. The constellation of findings can be seen the setting of anasarca. 3. Punctate free intraperitoneal gas within the upper abdomen adjacent to numerous surgical clips involving the distal stomach. A site of bowel perforation  is not clearly identified and there is no leakage of oral contrast. Correlation for an exogenous source of gas, as can be seen with paracentesis, is recommended. 4. Mild hepatic steatosis. 5. Changes in keeping with chronic pancreatitis. No definite superimposed acute peripancreatic inflammatory changes. Aortic Atherosclerosis (ICD10-I70.0). Electronically Signed   By: Fidela Salisbury M.D.   On: 03/24/2022 23:35    Assessment / Plan: #12 67 year old female with decompensated alcohol induced cirrhosis, chronic pancreatitis, and progressively severe malnutrition now requiring TPN   MELD =9   #2 acute and chronic anemia with continued drift in hemoglobin, stool heme-negative but required transfusion x 3 this admission.  Hgb 8.1 grams this AM.   #3 large prepyloric deep cratered ulcer documented at EGD 01/19/2022 with what was felt to be a self-contained fistulous tract communicating with the bulb also noted moderate portal gastropathy at that time no varices   Suspect she has had slow intermittent GI blood loss from combination of portal gastropathy and/or nonhealing of the cratered prepyloric ulcer in setting of severe malnutrition.   #4 severe desquamative dermatitis- extremities-finishing antibiotics for associated cellulitis.  Peeling of the hands and feet felt likely secondary to malnutrition/vitamin deficiencies.   #5 new dyspnea and drop in O2 sats 03/22/2022- Remains on oxygen, comfortable today. She may have aspirated with episode of vomiting at that time but definitely has component of pulmonary edema with significantly elevated BNP.  Large right pleural effusion and pulmonary infiltrates seen on CT scan. Overnight.  Hospitalist managing.  *No change from GI perspective today. *Continue IV PPI twice daily. *Serial hemoglobins and transfuse as indicated. *Continue TPN. *Continue Creon with meals. *Consider EGD at some point during this hospitalization, not appropriate with current pulmonary  status.   Per palliative  care notes plan is to continue aggressive management but with liberal pain medication focus, and if no improvement in the next couple of days then consider transition to hospice.     LOS: 11 days   Princella Pellegrini. Zehr  03/25/2022, 11:10 AM  I have taken an interval history, thoroughly reviewed the chart and examined the patient. I agree with the Advanced Practitioner's note, impression and recommendations, and have recorded additional findings, impressions and recommendations below. I performed a substantive portion of this encounter (>50% time spent), including a complete performance of the medical decision making.   CT chest abdomen pelvis images personally reviewed.  My additional thoughts are as follows:  She remains clinically stable, fair oral intake, remains on TPN.  Still dyspneic requiring supplemental oxygen, anasarca including pleural effusions.  I had a further conversation with her and her partner about a surgery she had decades ago related to complicated pancreatitis and perhaps cholecystitis.  She had what sounds like some kind of bilioenteric reconstruction, evidence for this is seen with surgical clips on the upper abdomen on CT scan.  The exact anatomy is unclear from this, but I believe that explains the excavated lesion described as ulcer on the most recent upper endoscopy.  Having seen and now understood the nature of that and the fact that she is heme-negative this admission, I do not think she is having GI blood loss to account for this anemia and no longer feels she needs an upper endoscopy.  She was relieved to hear that, hopeful she would still recover to some degree with better nutrition and other supportive care. We understand from palliative care that her exact disposition and level of care are being reevaluated day-to-day.  Having answer the initial question to [place] of anemia and concern for GI bleeding, and now with no further recommendations  for changes in medical management or further GI related testing, the inpatient GI service will sign off and remain available as needed for urgent issues.   Charlie Pitter III [phone]

## 2022-03-25 NOTE — Progress Notes (Signed)
PHARMACY - TOTAL PARENTERAL NUTRITION CONSULT NOTE   Indication:  chronic severe malnutrition with desquamating rash  Patient Measurements: Height: 5\' 8"  (172.7 cm) Weight: 61.7 kg (136 lb 0.4 oz) IBW/kg (Calculated) : 63.9 TPN AdjBW (KG): 46 Body mass index is 20.68 kg/m.  Assessment: 67 year old female with severe malnutrition related to chronic illness. Patient with desquamating rash likely related to significant nutritional/vitamin deficiencies. Pharmacy consulted to dose TPN to accelerate improvement in nutritional deficiencies. Initially planning for 7-10 days of TPN.  Glucose / Insulin: no hx diabetes. CBG goal <150. - CBG range: 158 - 232 after TPN with insulin was started; up to 347 prior - Dexamethasone 8 mg IV q24h started 1/9. MD planning to continue - sSSI: 33 units/24 hr on resistant SSI q6h (11 units since insulin containing TPN started) Electrolytes: Phos (5) elevated, Mg (2.4) on upper end of normal. All other lytes WNL including CorrCa (9.3) - now WNL after removing Ca from TPN. Renal: SCr WNL, BUN elevated and increased Hepatic: AST/ALT WNL, albumin low but improved Intake / Output:  - UOP: 1200 mL + 2 unmeasured; stool: 3 unmeasured - No mIVF GI Imaging: NA GI Surgeries / Procedures: NA  Central access: 1/3 TPN start date: 1/3  Nutritional Goals: Goal TPN rate is 80 mL/hr (provides 100 g of protein and 1954 kcals per day)  RD Assessment: Estimated Needs Total Energy Estimated Needs: 1900-2100 Total Protein Estimated Needs: 95-105g Total Fluid Estimated Needs: 2L/day  Current Nutrition: regular diet ordered, 25% meal intake charted Enteral supplements:  Ensure Plus TID, Prosource BID (pt had two Ensure, both Prosource yesterday)  Plan:  Per discussion with TRH on 1/9, planning for a 10 day duration of TPN. Today is day #8.  At 1800: Continue TPN at goal rate 80 mL/hr Electrolytes in TPN: Remove Phos, Decrease Mg Na 150 mEq/L, K 40 mEq/L, Ca 0 mEq/L,  Mg 5 mEq/L, and Phos 0 mmol/L. Cl:Ac 1:1 Add standard MVI and trace elements to TPN (provides 60 mcg selenium, adequate per discussion with RD) Add thiamine 502 mg and folic acid 1 mg to TPN Continue with 15 units of insulin to TPN. Continue CBG checks q6h + resistant SSI Monitor TPN labs on Mon/Thurs. Recheck electrolytes with AM labs tomorrow.  Lenis Noon, PharmD 03/25/22 7:53 AM

## 2022-03-25 NOTE — Progress Notes (Signed)
Physical Therapy Treatment Patient Details Name: Carolyn [name] MRN: [id number] DOB: Mar 17, 1955 Today's Date: 03/25/2022   History of Present Illness 67 year old female with history of chronic pancreatitis, chronic pain syndrome, GERD, severe malnutrition, osteoporosis who presented to [facility] ED with LE "cellulitis due to breakdown from desquammating wounds" involving bil feet and to lesser extent hands.  Pt admitted 03/13/22 for severe sepsis secondary to discriminative dermatitis with superimposed cellulitis and possible underlying osteomyelitis    PT Comments    Pt very agreeable to participate.  Pt premedicated with IV pain meds and received BP meds prior to session as well.  BP 157/86 mmHg.  SPO2 remained 96% or > on 5L O2 [place] however pt reported moderate dyspnea with exercises so performed multiple short rest breaks.  Pt able to sit EOB and performed exercises and appears in less pain today compared to previous PT session.  Pt also able to place and maintain feet on floor for 2 minutes.  Pt's spouse present and encouraged pt and spouse to have pt sit EOB for meals as tolerated.  HR elevated to 126 bpm during session.  SNF remains appropriate d/c recommendation at this time.   Recommendations for follow up therapy are one component of a multi-disciplinary discharge planning process, led by the attending physician.  Recommendations may be updated based on patient status, additional functional criteria and insurance authorization.  Follow Up Recommendations  Skilled nursing-short term rehab (<3 hours/day) Can patient physically be transported by private vehicle: No   Assistance Recommended at Discharge Frequent or constant Supervision/Assistance  Patient can return home with the following Two people to help with walking and/or transfers;A lot of help with bathing/dressing/bathroom;Assistance with cooking/housework;Assist for transportation;Help with stairs or ramp for entrance   Equipment  Recommendations  None recommended by PT    Recommendations for Other Services       Precautions / Restrictions Precautions Precautions: Fall Precaution Comments: monitor BP, HR     Mobility  Bed Mobility Overal bed mobility: Needs Assistance Bed Mobility: Supine to Sit, Sit to Supine     Supine to sit: Min guard Sit to supine: Min assist   General bed mobility comments: slow, cautious movement due to pain; light assist for positioning upon returning to supine, assist for line management; mod assist for scooting up HOB (utilized bed pads for pain control)    Transfers                        Ambulation/Gait                   Stairs             Wheelchair Mobility    Modified Rankin (Stroke Patients Only)       Balance Overall balance assessment: Needs assistance Sitting-balance support: No upper extremity supported, Feet unsupported Sitting balance-Leahy Scale: Fair                                      Cognition Arousal/Alertness: Awake/alert Behavior During Therapy: WFL for tasks assessed/performed Overall Cognitive Status: Within Functional Limits for tasks assessed                                          Exercises General Exercises - Lower Extremity Ankle  Circles/Pumps: AROM, Both, 10 reps, Limitations Ankle Circles/Pumps Limitations: limited by pain/dressings Long Arc Quad: AROM, Both, 10 reps, Seated Hip ABduction/ADduction: AROM, Both, 10 reps, Seated Hip Flexion/Marching: AROM, Both, Seated, 20 reps Other Exercises Other Exercises: scapular retraction x5 sitting EOB    General Comments        Pertinent Vitals/Pain Pain Assessment Pain Assessment: 0-10 Pain Score: 7  Pain Location: bilateral feet and hands Pain Descriptors / Indicators: Tender, Discomfort Pain Intervention(s): Repositioned, Monitored during session, Premedicated before session    Home Living                           Prior Function            PT Goals (current goals can now be found in the care plan section) Progress towards PT goals: Progressing toward goals    Frequency    Min 2X/week      PT Plan Current plan remains appropriate    Co-evaluation              AM-PAC PT "6 Clicks" Mobility   Outcome Measure  Help needed turning from your back to your side while in a flat bed without using bedrails?: A Lot Help needed moving from lying on your back to sitting on the side of a flat bed without using bedrails?: A Lot Help needed moving to and from a bed to a chair (including a wheelchair)?: A Lot Help needed standing up from a chair using your arms (e.g., wheelchair or bedside chair)?: Total Help needed to walk in hospital room?: Total Help needed climbing 3-5 steps with a railing? : Total 6 Click Score: 9    End of Session   Activity Tolerance: Patient tolerated treatment well Patient left: with call bell/phone within reach;with family/visitor present Nurse Communication: Mobility status PT Visit Diagnosis: History of falling (Z91.81);Difficulty in walking, not elsewhere classified (R26.2)     Time: 2202-5427 PT Time Calculation (min) (ACUTE ONLY): 29 min  Charges:  $Therapeutic Exercise: 23-37 mins                    Jannette Spanner PT, DPT Physical Therapist Acute Rehabilitation Services Preferred contact method: Secure Chat Weekend Pager Only: [phone] Office: [place] 03/25/2022, 2:36 PM

## 2022-03-25 NOTE — Progress Notes (Signed)
PROGRESS NOTE    Carolyn [name]  UXN:[id number] DOB: Jul 03, 1955 DOA: 03/13/2022 PCP: Pcp, No   Brief Narrative:  The patient is a 67 year old female, past medical history of chronic pancreatitis, history of prior alcoholism, gastroesophageal reflux, prior vitamin D deficiency documented. She has known malabsorption issues and has been on Creon. She presents with cellulitis and sepsis related to a ongoing rash of the hands and feet. It appears that the rash has been going on for several weeks continued to get worse. As started off with a small blister the blister then ruptured the skin started to dry out crack and flake. She is starting to flake and desquamate portions of the hands and feet. She developed increased pain and swelling in the right hand that developed redness that tracks into the forearm. She presents to the ER with an elevated white count and was treated with antibiotics for concern of sepsis.     **Interim History Patient was admitted by the critical care team and then subsequently transferred to the [facility] service on 01/14/2021 after she was stabilized.  She continues have significant pain and GI and palliative care been consulted.  Patient was transfused 3 units of PRBCs while she was hospitalized given her trending hemoglobin.  Care has now been adjusting her pain medications and IV Decadron.  GI is doing further workup and monitoring and CT scan of the abdomen chest and pelvis to evaluate for other causes of weight loss and ruling out malignancy.  See below for CT results.  Patient continues to still be volume overloaded so we will continue IV diuresis but will increase the dose of diuresis from IV 40 every 12 to IV 60 every 12.   Assessment and Plan:  Severe sepsis secondary to desquamative dermatitis with superimposed cellulitis and possible underlying osteomyelitis -Patient noted to have presented with complicated nonhealing desquamative dermatitis involving hands and feet  complicated by severe sepsis due to cellulitis/wound infection. -MRI of bilateral hands and feet done without any definite evidence of osteomyelitis. -Patient initially admitted to the critical care service, concern for probable vitamin/nutritional deficiencies versus paraneoplastic syndromes. -Concern for possible nutritional deficiencies, B2, B6, tryptophan, zinc, thiamine etc. could lead to this. -Patient noted to be prior alcoholic now with a chronic pancreatitis on Creon and likely has pre-existing condition of malabsorption as well as possible gastric surgery unsure as to what the patient has had this on not however per PCCM. -Patient placed empirically on IV vancomycin, IV Rocephin and subsequently transition to doxycycline and Augmentin and just completed a 10-day course of antibiotic treatment 03/22/2022 but then IV Unasyn was resumed given concern for likely Aspiration Pneumonia.  -Continue vitamin D3, vitamin B12, zinc, vitamin D3. -Continue vitamin A, selenium. -Will need follow-up with Dermatology (she is followed in [place]) -Will need outpatient follow-up with Podiatry for bone biopsy to evaluate for osteomyelitis. -Outpatient follow-up with ID which had been set for 03/22/2022. -Patient unable to ambulate seen by PT would likely require SNF placement. -Patient continues to have ongoing pain, causing tachycardia, continue current regimen and IV Dilaudid as needed. -Continue Neurontin 300 mg 3 times daily, scheduled Tylenol.  -Consulted palliative care for goals of care and symptom management and appreciate palliative involvement.  Patient is DNR and they are recommending medical interventions desired if medically indicated and appropriate. -Given the patient's uncontrolled pain palliative care is increased to hydromorphone to 2 mg every 2 as well as added a fentanyl patch 25 mcg and added phenobarbital for  anxiety nightly and if she tolerates this regular schedule  this as needed -Palliative is also added Decadron 8 mg IV daily as well as recommending continuing aggressive diuresis and continue antibiotics and checking T3, T4 as well as cortisol level. -Continue supportive care and if she continues to drop her hemoglobin or develops acute GI bleeding however does not show any improvement in the next 24 to 48 hours then palliative will discuss hospice or end-of-life comfort care options   Acute Hypoxic respiratory failure likely secondary to acute on chronic diastolic CHF exacerbation +/- aspiration pneumonia -Patient noted to have a bout of nausea and emesis yesterday evening, noted to have increased O2 requirements currently on 5 L nasal cannula and was on room air 24 hours ago. -Chest x-ray done yesterday morning with new bilateral patchy airspace opacities possibly due to multifocal infection or pulmonary edema, small bilateral pleural effusions. -SpO2: 98 % O2 Flow Rate (L/min): 5 L/min -BNP noted to be significantly elevated at 3564.6 -Patient on IV albumin every 6 hours x 24 hours. -Placed on Lasix 40 mg IV every 12 hours, strict I's and O's, daily weights; she is still +10.954L since admission -Placed empirically on IV Unasyn given likley aspiration  -CT Chest done and showed "Multifocal ground-glass pulmonary infiltrates, new from chest radiograph of 03/15/2022, more prevalent within the lung apices bilaterally, possibly related to atypical infection in the acute setting. Large right and small left pleural effusions" -ECHO from 01/15/22 showed that the left ventricular ejection fraction was estimated to be 70 to 75% with left ventricular having hyperdynamic function in the left ventricle having no regional wall motion abnormalities.  There is mild left ventricular hypertrophy of the basal septal segment and the left ventricular diastolic parameters are consistent with grade 1 diastolic dysfunction -Repeat chest x-ray in 48 hours and if significant  clinical improvement with diuresis would likely stop IV antibiotics at that time. -Continue monitor and trend respiratory status   History of chronic alcoholic pancreatitis -Continue Creon.    Hepatic cirrhosis with Ascites -Being followed in the outpatient setting by [facility]. -BP soft and as such diuretics held spironolactone and Lasix held. -Patient with no hepatic encephalopathy, -CT Abdomen and Pelvis done and showed "Marked diffuse subcutaneous body wall edema. Mild ascites. The constellation of findings can be seen the setting of anasarca. Punctate free intraperitoneal gas within the upper abdomen adjacent to numerous surgical clips involving the distal stomach. A site of bowel perforation is not clearly identified and there is no leakage of oral contrast. Correlation for an exogenous source of gas, as can be seen with paracentesis, is recommended. Mild hepatic steatosis. Changes in keeping with chronic pancreatitis." -Outpatient follow-up with GI.   Gastric chronic ulcer with contained perforation -Stable. 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   -Hemoglobin seems to be trickling back down again  -Continue PPI twice daily, Pepcid twice daily.  -  Consulted with GI to see if repeat EGD needed, further evaluation and management. -GI recommends continuing PPI twice daily,  TPN as well as obtaining another Hemoccult which was negative today. -Patient was able to eat a little bit today and feels that her Decadron hydromorphone is helping her. -GI is low suspicion for an actively bleeding gastric ulcer given that she has no overt bleeding and heme-negative stool -GI is ordering a CT Scan of the Chest/Abd/Pelvis to rule out malignancy or other sources of weight loss and this is was done. -CT Chest/Abd/Pelvis was done and showed "Multifocal ground-glass pulmonary infiltrates, new from chest radiograph of 03/15/2022, more prevalent within the lung apices bilaterally, possibly related to atypical infection in the acute setting. Large right and small left pleural effusions. Marked diffuse subcutaneous body wall edema. Mild ascites. The constellation of findings can be seen the setting of anasarca. Punctate free intraperitoneal gas within the upper abdomen adjacent to numerous surgical clips involving the distal stomach. A site of bowel perforation is not clearly identified and there is no leakage of oral contrast. Correlation for an exogenous source of gas, as can be seen with paracentesis, is recommended. Mild hepatic steatosis. Changes in keeping with chronic pancreatitis. No definite superimposed acute peripancreatic inflammatory changes. Aortic Atherosclerosis,"   Hypothyroidism -TSH noted at 33.958 on 03/15/2022. -Continue Synthroid 50 mcg's daily.   -Will need repeat labs in 4 to 6 weeks in the outpatient setting.     Hypophosphatemia Hypomagnesemia -Mag and Phos Trend: Recent Labs  Lab 03/19/22 0830 03/20/22 0409 03/21/22 0309 03/22/22 0234 03/23/22 0347 03/24/22 0152 03/25/22 0400  MG 1.9 1.7 2.0 1.8 2.0 1.6* 2.4  PHOS 2.7 2.9  --  2.7 3.3 4.0 5.0*  -Repletion per pharmacy.   -Repeat labs in the AM.     Severe protein calorie malnutrition -Patient seen by dietitian, PICC line placed and patient started on TPN per pharmacy. Nutrition Status: Nutrition  Problem: Severe Malnutrition Etiology: chronic illness (chronic pancreatitis, h/o multiple gastric surgeries including gastric bypass) Signs/Symptoms: severe fat depletion, severe muscle depletion Interventions: Ensure Enlive (each supplement provides 350kcal and 20 grams of protein), MVI, Tube feeding, Prostat -Albumin level noted at< 1.5 -IV albumin every 6 hours x 24 hours.   -Continuing TPN for total of 10 days.   Anemia of chronic disease/folate deficiency -Hemoglobin noted to go as low as 6.5, status post transfusion 1 unit packed red blood cells.   Hemoglobin noted at 7.1 on 03/20/2022.  Status posttransfusion 2 units packed red blood cells hemoglobin currently at 8.8 from 10.1 (03/22/2022 ). 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   -Hemoglobin seems to be trickling back down.   -Hgb/Hct Trend: Recent Labs  Lab 03/19/22 1200 03/20/22 0409 03/21/22 0309 03/22/22 0234 03/23/22 0347 03/24/22 0152 03/25/22 0400  HGB 7.5* 7.1* 10.0* 10.1* 8.8* 8.2* 8.1*  HCT 23.0* 22.5* 30.5* 31.3* 28.1* 26.6* 26.6*  MCV 96.2 98.3 95.3 96.9 97.9 100.8* 101.9*  -GI consulted for further evaluation and management to see if repeat EGD needed as well.   -Follow H&H, transfusion threshold hemoglobin  < 8.    Right upper extremity swelling and pain -Clinical improvement.   -RUE Dopplers negative for DVT. -Keep right upper extremity elevated. -Patient started on diuretics.   NSVT -Patient noted to have 7 beat run of NSVT on 03/19/2022,  patient was asymptomatic.   -Electrolytes repleted potassium today currently at 4.4, magnesium at 2. -Hemoglobin noted at 7.1 on 03/20/2022, status post transfusion 2 units packed red blood cells hemoglobin currently at 8.1 and seems to be trending down.  -Repeat labs in the AM.      DVT  prophylaxis: Place and maintain sequential compression device Start: 03/23/22 1714 SCDs Start: 03/14/22 1801    Code Status: DNR Family Communication: No family currently at bedside  Disposition Plan:  Level of care: Telemetry Status is: Inpatient Remains inpatient appropriate because: His further clinical improvement and diuresis back to dry weight as well as clearance by his multiple specialist.  Will continue current palliative care goals of care discussion and she continues to have TPN   Consultants:  Palliative care medicine PCCM transfer Infectious diseases Gastroenterology  Procedures:  As delineated as above  Antimicrobials:  Anti-infectives (From admission, onward)    Start     Dose/Rate Route Frequency Ordered Stop   03/23/22 1215  Ampicillin-Sulbactam (UNASYN) 3 g in sodium chloride 0.9 % 100 mL IVPB        3 g 200 mL/hr over 30 Minutes Intravenous Every 6 hours 03/23/22 1118     03/19/22 2200  amoxicillin-clavulanate (AUGMENTIN) 875-125 MG per tablet 1 tablet        1 tablet Oral Every 12 hours 03/19/22 1115 03/22/22 2231   03/19/22 2200  doxycycline (VIBRA-TABS) tablet 100 mg        100 mg Oral Every 12 hours 03/19/22 1115 03/22/22 2230   03/14/22 2200  vancomycin (VANCOREADY) IVPB 500 mg/100 mL  Status:  Discontinued        500 mg 100 mL/hr over 60 Minutes Intravenous Every 24 hours 03/13/22 2049 03/19/22 1116   03/13/22 1915  vancomycin (VANCOCIN) IVPB 1000 mg/200 mL premix        1,000 mg 200 mL/hr over 60 Minutes Intravenous  Once 03/13/22 1908 03/14/22 0103   03/13/22 1615  cefTRIAXone (ROCEPHIN) 2 g in sodium chloride 0.9 % 100 mL IVPB        2 g 200 mL/hr over 30 Minutes Intravenous Every 24 hours 03/13/22 1603 03/19/22 1959       Subjective: Seen and examined at bedside and she states that she was still volume overloaded and is not doing as well today.  No nausea or vomiting but was complaining of some pain.  Thinks she is swollen.  No lightheadedness  or dizziness.  No other concerns or complaints at this time.  Objective: Vitals:   03/25/22 1402 03/25/22 1600 03/25/22 1750 03/25/22 1800  BP:  (!) 148/88  (!) 140/89  Pulse:  (!) 119 (!) 113 (!) 110  Resp:  19 13 11   Temp:    98.8 F (37.1 C)  TempSrc:    Oral  SpO2: 96% 97% 97% 100%  Weight:      Height:        Intake/Output Summary (Last 24 hours) at 03/25/2022 1921 Last data filed at 03/25/2022 1511 Gross per 24 hour  Intake 2842.7 ml  Output 600 ml  Net 2242.7 ml   Filed Weights   03/23/22 0500 03/24/22 0500 03/25/22 0120  Weight: 58.5 kg 59.8 kg 61.7 kg   Examination: Physical Exam:  Constitutional: Thin cachectic and chronically ill-appearing Caucasian female who appears little uncomfortable complaining of some pain Respiratory: Diminished to auscultation bilaterally with coarse breath sounds and had some crackles and some slight rhonchi, no wheezing, rales. Normal respiratory effort and  patient is not tachypenic. No accessory muscle use.  Wearing supplemental oxygen via nasal cannula Cardiovascular: RRR, no murmurs / rubs / gallops. S1 and S2 auscultated.  Patient has some anasarca and 1-2+ upper extremity edema that is pitting and lower extremity pitting edema Abdomen: Soft, non-tender, non-distended. Bowel sounds positive.  GU: Deferred. Musculoskeletal: No clubbing / cyanosis of digits/nails. No joint deformity upper and lower extremities.  Skin: Desquamative changes are wrapped Neurologic: CN 2-12 grossly intact with no focal deficits.  Romberg sign and cerebellar reflexes not assessed.  Psychiatric: Normal judgment and insight. Alert and oriented x 3.  A Little anxious mood and appropriate affect.   Data Reviewed: I have personally reviewed following labs and imaging studies  CBC: Recent Labs  Lab 03/19/22 1200 03/20/22 0409 03/21/22 0309 03/22/22 0234 03/23/22 0347 03/24/22 0152 03/25/22 0400  WBC 14.9* 11.6* 12.6* 13.3* 13.0* 9.4 11.8*  NEUTROABS  12.3* 9.5*  --  10.6*  --  8.5* 10.1*  HGB 7.5* 7.1* 10.0* 10.1* 8.8* 8.2* 8.1*  HCT 23.0* 22.5* 30.5* 31.3* 28.1* 26.6* 26.6*  MCV 96.2 98.3 95.3 96.9 97.9 100.8* 101.9*  PLT 133* 126* 131* 148* 128* 122* 157   Basic Metabolic Panel: Recent Labs  Lab 03/20/22 0409 03/21/22 0309 03/22/22 0234 03/23/22 0347 03/24/22 0152 03/25/22 0400  NA 133* 134* 131* 136 138 139  K 4.5 4.5 4.4 4.4 4.1 4.2  CL 103 105 102 104 102 102  CO2 25 25 23 25 27 28   GLUCOSE 228* 268* 303* 156* 251* 230*  BUN 22 23 24* 25* 31* 54*  CREATININE 0.61 0.59 0.70 0.43* 0.62 0.77  CALCIUM 7.1* 7.2* 7.5* 8.2* 8.4* 8.7*  MG 1.7 2.0 1.8 2.0 1.6* 2.4  PHOS 2.9  --  2.7 3.3 4.0 5.0*   GFR: Estimated Creatinine Clearance: 69.2 mL/min (by C-G formula based on SCr of 0.77 mg/dL). Liver Function Tests: Recent Labs  Lab 03/22/22 0234 03/25/22 0400  AST 30 33  ALT 17 22  ALKPHOS 120 86  BILITOT 0.4 0.3  PROT 4.8* 6.2*  ALBUMIN <1.5* 3.3*   No results for input(s): "LIPASE", "AMYLASE" in the last 168 hours. No results for input(s): "AMMONIA" in the last 168 hours. Coagulation Profile: Recent Labs  Lab 03/23/22 1451  INR 1.3*   Cardiac Enzymes: No results for input(s): "CKTOTAL", "CKMB", "CKMBINDEX", "TROPONINI" in the last 168 hours. BNP (last 3 results) No results for input(s): "PROBNP" in the last 8760 hours. HbA1C: No results for input(s): "HGBA1C" in the last 72 hours. CBG: Recent Labs  Lab 03/24/22 1747 03/25/22 0011 03/25/22 0604 03/25/22 1213 03/25/22 1714  GLUCAP 347* 232* 158* 137* 237*   Lipid Profile: No results for input(s): "CHOL", "HDL", "LDLCALC", "TRIG", "CHOLHDL", "LDLDIRECT" in the last 72 hours. Thyroid Function Tests: Recent Labs    03/23/22 1451  FREET4 1.07  T3FREE 2.8   Anemia Panel: No results for input(s): "VITAMINB12", "FOLATE", "FERRITIN", "TIBC", "IRON", "RETICCTPCT" in the last 72 hours. Sepsis Labs: No results for input(s): "PROCALCITON", "LATICACIDVEN" in  the last 168 hours.  No results found for this or any previous visit (from the past 240 hour(s)).   Radiology Studies: CT CHEST ABDOMEN PELVIS W CONTRAST  Result Date: 03/24/2022 CLINICAL DATA:  Weight loss, unintended EXAM: CT CHEST, ABDOMEN, AND PELVIS WITH CONTRAST TECHNIQUE: Multidetector CT imaging of the chest, abdomen and pelvis was performed following the standard protocol during bolus administration of intravenous contrast. RADIATION DOSE REDUCTION: This exam was performed according to the departmental  dose-optimization program which includes automated exposure control, adjustment of the mA and/or kV according to patient size and/or use of iterative reconstruction technique. CONTRAST:  OMNIPAQUE IOHEXOL 300 MG/ML  SOLN COMPARISON:  CT abdomen pelvis 11/03/2012 FINDINGS: CT CHEST FINDINGS Cardiovascular: No significant coronary artery calcification. Global cardiac size within normal limits. No pericardial effusion. Central pulmonary arteries are of normal caliber. Mild atherosclerotic calcification within the thoracic aorta. No aortic aneurysm. Left upper extremity PICC line tip noted at the superior cavoatrial junction. Mediastinum/Nodes: There is diffuse mild infiltration of the mediastinal fat in keeping with edema. No loculated fluid collections are identified, however. Visualized thyroid is unremarkable. No pathologic thoracic adenopathy. Esophagus unremarkable. Lungs/Pleura: Multifocal ground-glass pulmonary infiltrates are present, new from chest radiograph of 03/15/2022, more prevalent within the lung apices bilaterally, possibly related to atypical infection in the acute setting. Large right and small left pleural effusions have developed. Subtotal collapse of the right lower lobe. No pneumothorax. No central obstructing lesion. Musculoskeletal: Healed bilateral rib fractures are noted. No acute bone abnormality. No lytic or blastic bone lesion. CT ABDOMEN PELVIS FINDINGS Hepatobiliary:  Mild hepatic steatosis. No enhancing intrahepatic mass. No intra or extrahepatic biliary ductal dilation. Status post cholecystectomy. Pancreas: The pancreas is markedly atrophic and there are scattered parenchymal calcifications seen throughout the residual pancreas in keeping with changes of chronic pancreatitis. No definite superimposed acute peripancreatic inflammatory changes. No loculated peripancreatic fluid collections are seen. Spleen: Normal in size without focal abnormality. Adrenals/Urinary Tract: Adrenal glands are unremarkable. Kidneys are normal, without renal calculi, focal lesion, or hydronephrosis. Bladder is unremarkable. Stomach/Bowel: Numerous surgical clips are seen adjacent to the gastric antrum. Small bowel anastomotic staple lines are noted within the upper quadrants bilaterally. Adjacent to this region, within the subhepatic space is a small amount of extraluminal gas, best seen on axial image # 63/3 and coronal image # 30/6. The source is not clearly identified and there is no extraluminal extension of oral contrast seen. The stomach, small bowel, and large bowel are otherwise unremarkable. No evidence of obstruction or focal inflammation. The appendix is not visualized and is likely absent. Mild ascites is present. Vascular/Lymphatic: Mild atherosclerotic calcification within the supra celiac aorta. The abdominal vasculature is otherwise unremarkable. No pathologic adenopathy within the abdomen and pelvis. Reproductive: Uterus and bilateral adnexa are unremarkable. Other: There is diffuse severe subcutaneous body wall edema noted. No abdominal wall hernia. Musculoskeletal: No acute bone abnormality. No lytic or blastic bone lesion. IMPRESSION: 1. Multifocal ground-glass pulmonary infiltrates, new from chest radiograph of 03/15/2022, more prevalent within the lung apices bilaterally, possibly related to atypical infection in the acute setting. 2. Large right and small left pleural  effusions. Marked diffuse subcutaneous body wall edema. Mild ascites. The constellation of findings can be seen the setting of anasarca. 3. Punctate free intraperitoneal gas within the upper abdomen adjacent to numerous surgical clips involving the distal stomach. A site of bowel perforation is not clearly identified and there is no leakage of oral contrast. Correlation for an exogenous source of gas, as can be seen with paracentesis, is recommended. 4. Mild hepatic steatosis. 5. Changes in keeping with chronic pancreatitis. No definite superimposed acute peripancreatic inflammatory changes. Aortic Atherosclerosis (ICD10-I70.0). Electronically Signed   By: Helyn Numbers M.D.   On: 03/24/2022 23:35     Scheduled Meds:  (feeding supplement) PROSource Plus  30 mL Oral BID BM   acetaminophen  1,000 mg Oral TID   Chlorhexidine Gluconate Cloth  6 each Topical Daily  cholecalciferol  1,000 Units Oral Daily   dexamethasone (DECADRON) injection  8 mg Intravenous Q24H   famotidine  10 mg Oral BID   feeding supplement  237 mL Oral TID BM   fentaNYL  1 patch Transdermal Q72H   furosemide  40 mg Intravenous Q12H   gabapentin  300 mg Oral TID   insulin aspart  0-20 Units Subcutaneous Q6H   ipratropium  0.5 mg Nebulization Q6H   levalbuterol  0.63 mg Nebulization Q6H   levothyroxine  50 mcg Oral Q0600   liothyronine  25 mcg Oral Daily   lipase/protease/amylase  24,000 Units Oral TID WC   metoprolol tartrate  12.5 mg Oral BID   mupirocin cream   Topical Once   pantoprazole (PROTONIX) IV  40 mg Intravenous Q12H   PHENObarbital  32.5 mg Intravenous QHS   pyridOXINE  100 mg Oral Daily   silver sulfADIAZINE   Topical Daily   sodium chloride flush  10-40 mL Intracatheter Q12H   vitamin A  50,000 Units Oral Daily   vitamin B-12  100 mcg Oral Daily   vitamin E  400 Units Oral Daily   zinc sulfate  220 mg Oral Daily   Continuous Infusions:  ampicillin-sulbactam (UNASYN) IV 3 g (03/25/22 1749)   TPN ADULT  (ION) 80 mL/hr at 03/25/22 1852    LOS: 11 days   Raiford Noble, DO [facility] Available via Epic secure chat 7am-7pm After these hours, please refer to coverage provider listed on [email or web address] 03/25/2022, 7:21 PM

## 2022-03-26 ENCOUNTER — Inpatient Hospital Stay (HOSPITAL_COMMUNITY): Payer: Medicare PPO

## 2022-03-26 DIAGNOSIS — K860 Alcohol-induced chronic pancreatitis: Secondary | ICD-10-CM | POA: Diagnosis not present

## 2022-03-26 DIAGNOSIS — K255 Chronic or unspecified gastric ulcer with perforation: Secondary | ICD-10-CM | POA: Diagnosis not present

## 2022-03-26 DIAGNOSIS — D638 Anemia in other chronic diseases classified elsewhere: Secondary | ICD-10-CM | POA: Diagnosis not present

## 2022-03-26 DIAGNOSIS — R634 Abnormal weight loss: Secondary | ICD-10-CM | POA: Diagnosis not present

## 2022-03-26 LAB — CBC WITH DIFFERENTIAL/PLATELET
Abs Immature Granulocytes: 0.13 10*3/uL — ABNORMAL HIGH (ref 0.00–0.07)
Basophils Absolute: 0 10*3/uL (ref 0.0–0.1)
Basophils Relative: 0 %
Eosinophils Absolute: 0 10*3/uL (ref 0.0–0.5)
Eosinophils Relative: 0 %
HCT: 26.8 % — ABNORMAL LOW (ref 36.0–46.0)
Hemoglobin: 8.2 g/dL — ABNORMAL LOW (ref 12.0–15.0)
Immature Granulocytes: 1 %
Lymphocytes Relative: 7 %
Lymphs Abs: 0.7 10*3/uL (ref 0.7–4.0)
MCH: 30.6 pg (ref 26.0–34.0)
MCHC: 30.6 g/dL (ref 30.0–36.0)
MCV: 100 fL (ref 80.0–100.0)
Monocytes Absolute: 0.8 10*3/uL (ref 0.1–1.0)
Monocytes Relative: 8 %
Neutro Abs: 8.7 10*3/uL — ABNORMAL HIGH (ref 1.7–7.7)
Neutrophils Relative %: 84 %
Platelets: 190 10*3/uL (ref 150–400)
RBC: 2.68 MIL/uL — ABNORMAL LOW (ref 3.87–5.11)
RDW: 15.8 % — ABNORMAL HIGH (ref 11.5–15.5)
WBC: 10.3 10*3/uL (ref 4.0–10.5)
nRBC: 0.3 % — ABNORMAL HIGH (ref 0.0–0.2)

## 2022-03-26 LAB — COMPREHENSIVE METABOLIC PANEL
ALT: 30 U/L (ref 0–44)
AST: 42 U/L — ABNORMAL HIGH (ref 15–41)
Albumin: 3.1 g/dL — ABNORMAL LOW (ref 3.5–5.0)
Alkaline Phosphatase: 86 U/L (ref 38–126)
Anion gap: 8 (ref 5–15)
BUN: 62 mg/dL — ABNORMAL HIGH (ref 8–23)
CO2: 30 mmol/L (ref 22–32)
Calcium: 8.7 mg/dL — ABNORMAL LOW (ref 8.9–10.3)
Chloride: 103 mmol/L (ref 98–111)
Creatinine, Ser: 0.78 mg/dL (ref 0.44–1.00)
GFR, Estimated: >60 mL/min (ref >60)
Glucose, Bld: 140 mg/dL — ABNORMAL HIGH (ref 70–99)
Potassium: 4.4 mmol/L (ref 3.5–5.1)
Sodium: 141 mmol/L (ref 135–145)
Total Bilirubin: 0.4 mg/dL (ref 0.3–1.2)
Total Protein: 6.1 g/dL — ABNORMAL LOW (ref 6.5–8.1)

## 2022-03-26 LAB — GLUCOSE, CAPILLARY
Glucose-Capillary: 153 mg/dL — ABNORMAL HIGH (ref 70–99)
Glucose-Capillary: 238 mg/dL — ABNORMAL HIGH (ref 70–99)
Glucose-Capillary: 94 mg/dL (ref 70–99)

## 2022-03-26 LAB — PHOSPHORUS: Phosphorus: 4.7 mg/dL — ABNORMAL HIGH (ref 2.5–4.6)

## 2022-03-26 LAB — MAGNESIUM: Magnesium: 2.5 mg/dL — ABNORMAL HIGH (ref 1.7–2.4)

## 2022-03-26 MED ORDER — LEVALBUTEROL HCL 0.63 MG/3ML IN NEBU
0.6300 mg | INHALATION_SOLUTION | Freq: Four times a day (QID) | RESPIRATORY_TRACT | Status: DC
  Administered 2022-03-27 (×3): 0.63 mg via RESPIRATORY_TRACT
  Filled 2022-03-26 (×3): qty 3

## 2022-03-26 MED ORDER — IPRATROPIUM BROMIDE 0.02 % IN SOLN
0.5000 mg | Freq: Four times a day (QID) | RESPIRATORY_TRACT | Status: DC
  Administered 2022-03-27 (×3): 0.5 mg via RESPIRATORY_TRACT
  Filled 2022-03-26 (×3): qty 2.5

## 2022-03-26 MED ORDER — ENOXAPARIN SODIUM 40 MG/0.4ML IJ SOSY
40.0000 mg | PREFILLED_SYRINGE | INTRAMUSCULAR | Status: DC
  Administered 2022-03-26 – 2022-04-11 (×16): 40 mg via SUBCUTANEOUS
  Filled 2022-03-26 (×18): qty 0.4

## 2022-03-26 MED ORDER — ALBUMIN HUMAN 25 % IV SOLN
12.5000 g | Freq: Three times a day (TID) | INTRAVENOUS | Status: DC
  Administered 2022-03-26 – 2022-04-05 (×29): 12.5 g via INTRAVENOUS
  Filled 2022-03-26 (×29): qty 50

## 2022-03-26 MED ORDER — TRAVASOL 10 % IV SOLN
INTRAVENOUS | Status: AC
  Filled 2022-03-26: qty 998.4

## 2022-03-26 MED ORDER — FENTANYL 50 MCG/HR TD PT72
1.0000 | MEDICATED_PATCH | TRANSDERMAL | Status: DC
  Administered 2022-03-26 – 2022-03-29 (×2): 1 via TRANSDERMAL
  Filled 2022-03-26 (×2): qty 1

## 2022-03-26 NOTE — Progress Notes (Addendum)
PROGRESS NOTE    Carolyn [name]  VPX:[id number] DOB: 1955-05-03 DOA: 03/13/2022 PCP: Pcp, No   Brief Narrative:  The patient is a 67 year old female, past medical history of chronic pancreatitis, history of prior alcoholism, gastroesophageal reflux, prior vitamin D deficiency documented. She has known malabsorption issues and has been on Creon. She presents with cellulitis and sepsis related to a ongoing rash of the hands and feet. It appears that the rash has been going on for several weeks continued to get worse. As started off with a small blister the blister then ruptured the skin started to dry out crack and flake. She is starting to flake and desquamate portions of the hands and feet. She developed increased pain and swelling in the right hand that developed redness that tracks into the forearm. She presents to the ER with an elevated white count and was treated with antibiotics for concern of sepsis.     **Interim History Patient was admitted by the critical care team and then subsequently transferred to the [facility] service on 01/14/2021 after she was stabilized.  She continues have significant pain and GI and palliative care been consulted.  Patient was transfused 3 units of PRBCs while she was hospitalized given her trending hemoglobin.  Care has now been adjusting her pain medications and IV Decadron.  GI is doing further workup and monitoring and CT scan of the abdomen chest and pelvis to evaluate for other causes of weight loss and ruling out malignancy.   See below for CT results.  Patient continues to still be volume overloaded so we will continue IV diuresis but will increase the dose of diuresis from IV 40 every 12 to IV 60 every 12h given that she remains significantly volume overloaded but will may be even consider increasing to 80 twice daily.  Assessment and Plan:  Severe sepsis secondary to desquamative dermatitis with superimposed cellulitis and possible underlying  osteomyelitis -Patient noted to have presented with complicated nonhealing desquamative dermatitis involving hands and feet complicated by severe sepsis due to cellulitis/wound infection. -MRI of bilateral hands and feet done without any definite evidence of osteomyelitis. -Patient initially admitted to the critical care service, concern for probable vitamin/nutritional deficiencies versus paraneoplastic syndromes. -Concern for possible nutritional deficiencies, B2, B6, tryptophan, zinc, thiamine etc. could lead to this. -Patient noted to be prior alcoholic now with a chronic pancreatitis on Creon and likely has pre-existing condition of malabsorption as well as possible gastric surgery unsure as to what the patient has had this on not however per PCCM. -Patient placed empirically on IV vancomycin, IV Rocephin and subsequently transition to doxycycline and Augmentin and just completed a 10-day course of antibiotic treatment 03/22/2022 but then IV Unasyn was resumed given concern for likely Aspiration Pneumonia.  -Continue vitamin D3, vitamin B12, zinc, vitamin D3. -Continue vitamin A, selenium. -Will need follow-up with Dermatology (she is followed in [place]) -Will need outpatient follow-up with Podiatry for bone biopsy to evaluate for osteomyelitis. -Outpatient follow-up with ID which had been set for 03/22/2022. -Patient unable to ambulate seen by PT would likely require SNF placement. -Patient continues to have ongoing pain, causing tachycardia, continue current regimen and IV Dilaudid as needed. -Continue Neurontin 300 mg 3 times daily, scheduled Tylenol.  -Consulted palliative care for goals of care and symptom management and appreciate palliative involvement.  Patient is DNR and they are recommending medical interventions desired if medically indicated and appropriate. -Given the patient's uncontrolled pain palliative care is increased to hydromorphone  to 2 mg every 2 as  well as added a fentanyl patch 25 mcg and added phenobarbital for anxiety nightly and if she tolerates this regular schedule this as needed -Palliative is also added Decadron 8 mg IV daily as well as recommending continuing aggressive diuresis and continue antibiotics and checking T3, T4 as well as cortisol level. -Continue supportive care and if she continues to drop her hemoglobin or develops acute GI bleeding however does not show any improvement in the next 24 to 48 hours then palliative will discuss hospice or end-of-life comfort care options   Acute Hypoxic respiratory failure likely secondary to acute on chronic diastolic CHF exacerbation +/- aspiration pneumonia -Patient noted to have a bout of nausea and emesis yesterday evening, noted to have increased O2 requirements currently on 5 L nasal cannula and was on room air 24 hours ago. -Chest x-ray done yesterday morning with new bilateral patchy airspace opacities possibly due to multifocal infection or pulmonary edema, small bilateral pleural effusions. -SpO2: 96 % O2 Flow Rate (L/min): 3 L/min FiO2 (%): 32 % -BNP noted to be significantly elevated at 3564.6 -IV Albumin had stopped but may consider resuming  -Placed on Lasix 40 mg IV every 12 hours, strict I's and O's, daily weights; she is still +10.696 L since admission -Placed empirically on IV Unasyn given likley aspiration  -CT Chest done and showed "Multifocal ground-glass pulmonary infiltrates, new from chest radiograph of 03/15/2022, more prevalent within the lung apices bilaterally, possibly related to atypical infection in the acute setting. Large right and small left pleural effusions" -ECHO from 01/15/22 showed that the left ventricular ejection fraction was estimated to be 70 to 75% with left ventricular having hyperdynamic function in the left ventricle having no regional wall motion abnormalities.  There is mild left ventricular hypertrophy of the basal septal segment and the  left ventricular diastolic parameters are consistent with grade 1 diastolic dysfunction -C/w Dexamethasone 8 mg IV q24h -Repeat chest x-ray in 48 hours and if significant clinical improvement with diuresis would likely stop IV antibiotics at that time. -Repeat CXR this AM done and showed "Continued probable moderate congestive heart failure with increased prominence of a right pleural effusion." -Continue monitor and trend respiratory status   History of chronic alcoholic pancreatitis -Continue Creon.    Hepatic cirrhosis with Ascites Abnormal/Elevated LFTs, mild -Being followed in the outpatient setting by [facility]. -BP soft and as such diuretics held spironolactone and Lasix held. -Patient with no hepatic encephalopathy, -CT Abdomen and Pelvis done and showed "Marked diffuse subcutaneous body wall edema. Mild ascites. The constellation of findings can be seen the setting of anasarca. Punctate free intraperitoneal gas within the upper abdomen adjacent to numerous surgical clips involving the distal stomach. A site of bowel perforation is not clearly identified and there is no leakage of oral contrast. Correlation for an exogenous source of gas, as can be seen with paracentesis, is recommended. Mild hepatic steatosis. Changes in keeping with chronic pancreatitis." -LFT Trend: Recent Labs  Lab 03/18/22 0411 03/22/22 0234 03/25/22 0400 03/26/22 0323  AST 29 30 33 42*  ALT 20 17 22 30   -Outpatient follow-up with GI.   Gastric chronic ulcer with contained perforation -Stable. 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   -Hemoglobin seems to be trickling back down again  -Continue PPI twice daily, Pepcid twice daily.  -Consulted with GI to see if repeat EGD needed, further evaluation and management. -GI recommends continuing PPI twice daily, TPN as well as obtaining another Hemoccult which was negative today. -Patient was able to eat a little bit today and feels that her Decadron hydromorphone is helping her. -GI is low suspicion for an actively bleeding gastric ulcer given that she has no overt bleeding and heme-negative stool -GI is ordering a CT Scan of the Chest/Abd/Pelvis to rule out malignancy or other sources of weight loss and this is was done. -CT Chest/Abd/Pelvis was done and showed "Multifocal ground-glass pulmonary infiltrates, new from chest radiograph of 03/15/2022, more prevalent within the lung apices bilaterally, possibly related to atypical infection in the acute setting. Large right and small left pleural effusions. Marked diffuse subcutaneous body wall edema. Mild ascites. The constellation of findings can be seen the setting of anasarca. Punctate free intraperitoneal gas within the upper abdomen adjacent to numerous surgical clips involving the distal stomach. A site of bowel perforation is not clearly identified and there is no leakage of oral contrast. Correlation for an exogenous source of gas, as can be seen with paracentesis, is recommended. Mild hepatic steatosis. Changes in keeping with chronic pancreatitis. No definite superimposed acute peripancreatic inflammatory changes. Aortic Atherosclerosis,"   Hypothyroidism -TSH noted at 33.958 on 03/15/2022. -Continue Synthroid 50 mcg's daily.   -Will need repeat labs in 4 to 6 weeks in the outpatient setting.      Hypophosphatemia Hypomagnesemia -Mag and Phos Trend: Recent Labs  Lab 03/20/22 0409 03/21/22 0309 03/22/22 0234 03/23/22 0347 03/24/22 0152 03/25/22 0400 03/26/22 0323  MG 1.7 2.0 1.8 2.0 1.6* 2.4 2.5*  PHOS 2.9  --  2.7 3.3 4.0 5.0* 4.7*  -Repletion per pharmacy.   -Repeat labs in the AM.     Severe protein calorie malnutrition -Patient seen by dietitian, PICC line placed and patient started on TPN per pharmacy. 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   -Continuing TPN for total of 10 days.   Anemia of chronic disease/folate deficiency -Hemoglobin noted to go as low as 6.5, status post transfusion 1 unit packed red blood cells.   Hemoglobin noted at 7.1 on 03/20/2022.  Status posttransfusion 2 units packed red blood cells hemoglobin currently at 8.2 from 10.1 (03/22/2022 ). 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   -Hemoglobin seems to be trickling back down.   -Hgb/Hct Trend: Recent Labs  Lab 03/20/22 0409 03/21/22 0309 03/22/22 0234 03/23/22 0347 03/24/22 0152 03/25/22 0400 03/26/22 0323  HGB 7.1* 10.0* 10.1* 8.8* 8.2* 8.1* 8.2*  HCT 22.5* 30.5* 31.3* 28.1* 26.6* 26.6* 26.8*  MCV  98.3 95.3 96.9 97.9 100.8* 101.9* 100.0  -GI consulted for further evaluation and management to see if repeat EGD needed as well.   -Follow H&H, transfusion threshold hemoglobin  < 8.    Right upper extremity swelling and pain -Clinical  improvement.   -RUE Dopplers negative for DVT. -Keep right upper extremity elevated. -Patient started on diuretics.   NSVT -Patient noted to have 7 beat run of NSVT on 03/19/2022, patient was asymptomatic.   -Electrolytes repleted potassium today currently at 4.4, magnesium at 2. -Hemoglobin noted at 7.1 on 03/20/2022, status post transfusion 2 units packed red blood cells hemoglobin currently at 8.1 and seems to be trending down.  -Repeat labs in the AM.     DVT prophylaxis: enoxaparin (LOVENOX) injection 40 mg Start: 03/26/22 2200 Place and maintain sequential compression device Start: 03/23/22 1714 SCDs Start: 03/14/22 1801    Code Status: DNR Family Communication: No family present at bedside   Disposition Plan:  Level of care: Telemetry Status is: Inpatient Remains inpatient appropriate because: She continues to be extremely volume overloaded and needs further diuresis as well as further goals of care discussion she remains on TPN   Consultants:  Palliative care medicine PCCM transfer Infectious diseases Gastroenterology  Procedures:  As delineated as above  Antimicrobials:  Anti-infectives (From admission, onward)    Start     Dose/Rate Route Frequency Ordered Stop   03/23/22 1215  Ampicillin-Sulbactam (UNASYN) 3 g in sodium chloride 0.9 % 100 mL IVPB        3 g 200 mL/hr over 30 Minutes Intravenous Every 6 hours 03/23/22 1118     03/19/22 2200  amoxicillin-clavulanate (AUGMENTIN) 875-125 MG per tablet 1 tablet        1 tablet Oral Every 12 hours 03/19/22 1115 03/22/22 2231   03/19/22 2200  doxycycline (VIBRA-TABS) tablet 100 mg        100 mg Oral Every 12 hours 03/19/22 1115 03/22/22 2230   03/14/22 2200  vancomycin (VANCOREADY) IVPB 500 mg/100 mL  Status:  Discontinued        500 mg 100 mL/hr over 60 Minutes Intravenous Every 24 hours 03/13/22 2049 03/19/22 1116   03/13/22 1915  vancomycin (VANCOCIN) IVPB 1000 mg/200 mL premix        1,000 mg 200 mL/hr over 60  Minutes Intravenous  Once 03/13/22 1908 03/14/22 0103   03/13/22 1615  cefTRIAXone (ROCEPHIN) 2 g in sodium chloride 0.9 % 100 mL IVPB        2 g 200 mL/hr over 30 Minutes Intravenous Every 24 hours 03/13/22 1603 03/19/22 1959       Subjective: Seen and examined at bedside and was still having quite a bit of pain today.  Still manage volume overloaded and "puffy".  Feels okay.  No nausea or vomiting.  No other concerns or complaints at this time.  Objective: Vitals:   03/26/22 0400 03/26/22 0500 03/26/22 0738 03/26/22 1156  BP:    (!) 158/89  Pulse:   (!) 108 (!) 103  Resp:   13 12  Temp: 98.5 F (36.9 C)   98.2 F (36.8 C)  TempSrc: Oral   Oral  SpO2:   97% 96%  Weight:  66.4 kg    Height:        Intake/Output Summary (Last 24 hours) at 03/26/2022 1302 Last data filed at 03/26/2022 0636 Gross per 24 hour  Intake 1957.21 ml  Output 2500 ml  Net -542.79 ml   [employer]  03/24/22 0500 03/25/22 0120 03/26/22 0500  Weight: 59.8 kg 61.7 kg 66.4 kg   Examination: Physical Exam:  Constitutional: Thin cachectic and chronically ill-appearing Caucasian female who is little uncomfortable and still complaining some pain Respiratory: Diminished to auscultation bilaterally with coarse breath sounds, no wheezing, rales, rhonchi or crackles. Normal respiratory effort and patient is not tachypenic. No accessory muscle use.  Cardiovascular: Tachycardic rate but regular rhythm, no murmurs / rubs / gallops. S1 and S2 auscultated.  Has 1+ lower extremity edema as well as 1+ to 2+ upper extremity edema Abdomen: Soft, non-tender, non-distended. Bowel sounds positive.  GU: Deferred. Musculoskeletal: No clubbing / cyanosis of digits/nails. No joint deformity upper and lower extremities.  Skin: No rashes, lesions, ulcers on limited skin evaluation. No induration; Warm and dry.  Neurologic: CN 2-12 grossly intact with no focal deficits. Romberg sign and cerebellar reflexes not assessed.   Psychiatric: Normal judgment and insight. Alert and oriented x 3.  Anxious mood and appropriate affect.   Data Reviewed: I have personally reviewed following labs and imaging studies  CBC: Recent Labs  Lab 03/20/22 0409 03/21/22 0309 03/22/22 0234 03/23/22 0347 03/24/22 0152 03/25/22 0400 03/26/22 0323  WBC 11.6*   < > 13.3* 13.0* 9.4 11.8* 10.3  NEUTROABS 9.5*  --  10.6*  --  8.5* 10.1* 8.7*  HGB 7.1*   < > 10.1* 8.8* 8.2* 8.1* 8.2*  HCT 22.5*   < > 31.3* 28.1* 26.6* 26.6* 26.8*  MCV 98.3   < > 96.9 97.9 100.8* 101.9* 100.0  PLT 126*   < > 148* 128* 122* 157 190   < > = values in this interval not displayed.   Basic Metabolic Panel: Recent Labs  Lab 03/22/22 0234 03/23/22 0347 03/24/22 0152 03/25/22 0400 03/26/22 0323  NA 131* 136 138 139 141  K 4.4 4.4 4.1 4.2 4.4  CL 102 104 102 102 103  CO2 23 25 27 28 30   GLUCOSE 303* 156* 251* 230* 140*  BUN 24* 25* 31* 54* 62*  CREATININE 0.70 0.43* 0.62 0.77 0.78  CALCIUM 7.5* 8.2* 8.4* 8.7* 8.7*  MG 1.8 2.0 1.6* 2.4 2.5*  PHOS 2.7 3.3 4.0 5.0* 4.7*   GFR: Estimated Creatinine Clearance: 71.7 mL/min (by C-G formula based on SCr of 0.78 mg/dL). Liver Function Tests: Recent Labs  Lab 03/22/22 0234 03/25/22 0400 03/26/22 0323  AST 30 33 42*  ALT 17 22 30   ALKPHOS 120 86 86  BILITOT 0.4 0.3 0.4  PROT 4.8* 6.2* 6.1*  ALBUMIN <1.5* 3.3* 3.1*   No results for input(s): "LIPASE", "AMYLASE" in the last 168 hours. No results for input(s): "AMMONIA" in the last 168 hours. Coagulation Profile: Recent Labs  Lab 03/23/22 1451  INR 1.3*   Cardiac Enzymes: No results for input(s): "CKTOTAL", "CKMB", "CKMBINDEX", "TROPONINI" in the last 168 hours. BNP (last 3 results) No results for input(s): "PROBNP" in the last 8760 hours. HbA1C: No results for input(s): "HGBA1C" in the last 72 hours. CBG: Recent Labs  Lab 03/25/22 1213 03/25/22 1714 03/26/22 0001 03/26/22 0534 03/26/22 1153  GLUCAP 137* 237* 238* 153* 94    Lipid Profile: No results for input(s): "CHOL", "HDL", "LDLCALC", "TRIG", "CHOLHDL", "LDLDIRECT" in the last 72 hours. Thyroid Function Tests: Recent Labs    03/23/22 1451  FREET4 1.07  T3FREE 2.8   Anemia Panel: No results for input(s): "VITAMINB12", "FOLATE", "FERRITIN", "TIBC", "IRON", "RETICCTPCT" in the last 72 hours. Sepsis Labs: No results for input(s): "PROCALCITON", "LATICACIDVEN" in the last  168 hours.  No results found for this or any previous visit (from the past 240 hour(s)).   Radiology Studies: DG CHEST PORT 1 VIEW  Result Date: 03/26/2022 CLINICAL DATA:  Shortness of breath. EXAM: PORTABLE CHEST 1 VIEW COMPARISON:  Chest x-ray from abdominal series on 03/23/2022 FINDINGS: Stable heart size. Continued probable underlying moderate congestive heart failure with increased prominence of a right pleural effusion and stable smaller left pleural effusion. No pneumothorax. IMPRESSION: Continued probable moderate congestive heart failure with increased prominence of a right pleural effusion. Electronically Signed   By: Irish Lack M.D.   On: 03/26/2022 07:52   CT CHEST ABDOMEN PELVIS W CONTRAST  Result Date: 03/24/2022 CLINICAL DATA:  Weight loss, unintended EXAM: CT CHEST, ABDOMEN, AND PELVIS WITH CONTRAST TECHNIQUE: Multidetector CT imaging of the chest, abdomen and pelvis was performed following the standard protocol during bolus administration of intravenous contrast. RADIATION DOSE REDUCTION: This exam was performed according to the departmental dose-optimization program which includes automated exposure control, adjustment of the mA and/or kV according to patient size and/or use of iterative reconstruction technique. CONTRAST:  OMNIPAQUE IOHEXOL 300 MG/ML  SOLN COMPARISON:  CT abdomen pelvis 11/03/2012 FINDINGS: CT CHEST FINDINGS Cardiovascular: No significant coronary artery calcification. Global cardiac size within normal limits. No pericardial effusion. Central  pulmonary arteries are of normal caliber. Mild atherosclerotic calcification within the thoracic aorta. No aortic aneurysm. Left upper extremity PICC line tip noted at the superior cavoatrial junction. Mediastinum/Nodes: There is diffuse mild infiltration of the mediastinal fat in keeping with edema. No loculated fluid collections are identified, however. Visualized thyroid is unremarkable. No pathologic thoracic adenopathy. Esophagus unremarkable. Lungs/Pleura: Multifocal ground-glass pulmonary infiltrates are present, new from chest radiograph of 03/15/2022, more prevalent within the lung apices bilaterally, possibly related to atypical infection in the acute setting. Large right and small left pleural effusions have developed. Subtotal collapse of the right lower lobe. No pneumothorax. No central obstructing lesion. Musculoskeletal: Healed bilateral rib fractures are noted. No acute bone abnormality. No lytic or blastic bone lesion. CT ABDOMEN PELVIS FINDINGS Hepatobiliary: Mild hepatic steatosis. No enhancing intrahepatic mass. No intra or extrahepatic biliary ductal dilation. Status post cholecystectomy. Pancreas: The pancreas is markedly atrophic and there are scattered parenchymal calcifications seen throughout the residual pancreas in keeping with changes of chronic pancreatitis. No definite superimposed acute peripancreatic inflammatory changes. No loculated peripancreatic fluid collections are seen. Spleen: Normal in size without focal abnormality. Adrenals/Urinary Tract: Adrenal glands are unremarkable. Kidneys are normal, without renal calculi, focal lesion, or hydronephrosis. Bladder is unremarkable. Stomach/Bowel: Numerous surgical clips are seen adjacent to the gastric antrum. Small bowel anastomotic staple lines are noted within the upper quadrants bilaterally. Adjacent to this region, within the subhepatic space is a small amount of extraluminal gas, best seen on axial image # 63/3 and coronal image  # 30/6. The source is not clearly identified and there is no extraluminal extension of oral contrast seen. The stomach, small bowel, and large bowel are otherwise unremarkable. No evidence of obstruction or focal inflammation. The appendix is not visualized and is likely absent. Mild ascites is present. Vascular/Lymphatic: Mild atherosclerotic calcification within the supra celiac aorta. The abdominal vasculature is otherwise unremarkable. No pathologic adenopathy within the abdomen and pelvis. Reproductive: Uterus and bilateral adnexa are unremarkable. Other: There is diffuse severe subcutaneous body wall edema noted. No abdominal wall hernia. Musculoskeletal: No acute bone abnormality. No lytic or blastic bone lesion. IMPRESSION: 1. Multifocal ground-glass pulmonary infiltrates, new from chest radiograph of  03/15/2022, more prevalent within the lung apices bilaterally, possibly related to atypical infection in the acute setting. 2. Large right and small left pleural effusions. Marked diffuse subcutaneous body wall edema. Mild ascites. The constellation of findings can be seen the setting of anasarca. 3. Punctate free intraperitoneal gas within the upper abdomen adjacent to numerous surgical clips involving the distal stomach. A site of bowel perforation is not clearly identified and there is no leakage of oral contrast. Correlation for an exogenous source of gas, as can be seen with paracentesis, is recommended. 4. Mild hepatic steatosis. 5. Changes in keeping with chronic pancreatitis. No definite superimposed acute peripancreatic inflammatory changes. Aortic Atherosclerosis (ICD10-I70.0). Electronically Signed   By: Helyn Numbers M.D.   On: 03/24/2022 23:35    Scheduled Meds:  (feeding supplement) PROSource Plus  30 mL Oral BID BM   acetaminophen  1,000 mg Oral TID   Chlorhexidine Gluconate Cloth  6 each Topical Daily   cholecalciferol  1,000 Units Oral Daily   dexamethasone (DECADRON) injection  8 mg  Intravenous Q24H   enoxaparin (LOVENOX) injection  40 mg Subcutaneous Q24H   famotidine  10 mg Oral BID   feeding supplement  237 mL Oral TID BM   fentaNYL  1 patch Transdermal Q72H   furosemide  60 mg Intravenous Q12H   gabapentin  300 mg Oral TID   insulin aspart  0-20 Units Subcutaneous Q6H   ipratropium  0.5 mg Nebulization Q6H   levalbuterol  0.63 mg Nebulization Q6H   levothyroxine  50 mcg Oral Q0600   liothyronine  25 mcg Oral Daily   lipase/protease/amylase  24,000 Units Oral TID WC   metoprolol tartrate  12.5 mg Oral BID   mupirocin cream   Topical Once   pantoprazole (PROTONIX) IV  40 mg Intravenous Q12H   PHENObarbital  32.5 mg Intravenous QHS   pyridOXINE  100 mg Oral Daily   silver sulfADIAZINE   Topical Daily   sodium chloride flush  10-40 mL Intracatheter Q12H   vitamin A  50,000 Units Oral Daily   vitamin B-12  100 mcg Oral Daily   vitamin E  400 Units Oral Daily   zinc sulfate  220 mg Oral Daily   Continuous Infusions:  ampicillin-sulbactam (UNASYN) IV 3 g (03/26/22 0625)   TPN ADULT (ION) 80 mL/hr at 03/25/22 1852   TPN ADULT (ION)      LOS: 12 days   Marguerita Merles, DO [facility] Available via Epic secure chat 7am-7pm After these hours, please refer to coverage provider listed on [email or web address] 03/26/2022, 1:02 PM

## 2022-03-26 NOTE — Progress Notes (Signed)
PHARMACY - TOTAL PARENTERAL NUTRITION CONSULT NOTE   Indication:  chronic severe malnutrition with desquamating rash  Patient Measurements: Height: 5\' 8"  (172.7 cm) Weight: 66.4 kg (146 lb 6.2 oz) IBW/kg (Calculated) : 63.9 TPN AdjBW (KG): 46 Body mass index is 22.26 kg/m.  Assessment: 67 year old female with severe malnutrition related to chronic illness. Patient with desquamating rash likely related to significant nutritional/vitamin deficiencies. Pharmacy consulted to dose TPN to accelerate improvement in nutritional deficiencies. Initially planning for 7-10 days of TPN.  Glucose / Insulin: no hx diabetes. CBG goal <150. - CBG range: 137 - 238. Better controlled with the addition of insulin to TPN but not at goal. - Dexamethasone 8 mg IV q24h started 1/9. MD planning to continue - sSSI: 20 units/24 hr on resistant SSI q6h Electrolytes: Phos (4.7) remains slightly elevated but trending down after the removal of Phos from the TPN. Mg (2.5) slightly elevated. All other lytes WNL including CorrCa (9.4) - now WNL after removing Ca from TPN. Renal: SCr WNL, BUN elevated and continues to trend up Hepatic: AST slightly elevated, ALT WNL. albumin low but improved Intake / Output:  - UOP: 2500 mL + 2 unmeasured; stool: 2 unmeasured - No mIVF GI Imaging: NA GI Surgeries / Procedures: NA  Central access: 1/3 TPN start date: 1/3  Nutritional Goals: Goal TPN rate is 80 mL/hr (provides 100 g of protein and 1954 kcals per day)  RD Assessment: Estimated Needs Total Energy Estimated Needs: 1900-2100 Total Protein Estimated Needs: 95-105g Total Fluid Estimated Needs: 2L/day  Current Nutrition: regular diet ordered, 10% meal intake charted -Pt reports poor appetite. Minimal PO intake. -Enteral supplements:  Ensure Plus TID, Prosource BID (pt had two Ensure, both Prosource yesterday)  Plan:  Per discussion with TRH on 1/9, planning for a 10 day duration of TPN. Today is day #9. Will follow  along for ongoing [place] discussions and duration of therapy.  At 1800: Continue TPN at goal rate 80 mL/hr Electrolytes in TPN: Decrease Mg Na 150 mEq/L, K 40 mEq/L, Ca 0 mEq/L, Mg 3 mEq/L, and Phos 0 mmol/L. Cl:Ac 1:1 Add standard MVI and trace elements to TPN (provides 60 mcg selenium, adequate per discussion with RD) Add thiamine 147 mg and folic acid 1 mg to TPN Increase the amount of insulin in TPN slightly to 20 units. Continue CBG checks q6h + resistant SSI Monitor TPN labs on Mon/Thurs. Recheck electrolytes with AM labs tomorrow.  Lenis Noon, PharmD 03/26/22 7:56 AM

## 2022-03-27 DIAGNOSIS — K255 Chronic or unspecified gastric ulcer with perforation: Secondary | ICD-10-CM | POA: Diagnosis not present

## 2022-03-27 DIAGNOSIS — K860 Alcohol-induced chronic pancreatitis: Secondary | ICD-10-CM | POA: Diagnosis not present

## 2022-03-27 DIAGNOSIS — R634 Abnormal weight loss: Secondary | ICD-10-CM | POA: Diagnosis not present

## 2022-03-27 DIAGNOSIS — D638 Anemia in other chronic diseases classified elsewhere: Secondary | ICD-10-CM | POA: Diagnosis not present

## 2022-03-27 LAB — CBC WITH DIFFERENTIAL/PLATELET
Abs Immature Granulocytes: 0.2 10*3/uL — ABNORMAL HIGH (ref 0.00–0.07)
Basophils Absolute: 0 10*3/uL (ref 0.0–0.1)
Basophils Relative: 0 %
Eosinophils Absolute: 0 10*3/uL (ref 0.0–0.5)
Eosinophils Relative: 0 %
HCT: 27.5 % — ABNORMAL LOW (ref 36.0–46.0)
Hemoglobin: 8.6 g/dL — ABNORMAL LOW (ref 12.0–15.0)
Immature Granulocytes: 2 %
Lymphocytes Relative: 7 %
Lymphs Abs: 0.7 10*3/uL (ref 0.7–4.0)
MCH: 31.6 pg (ref 26.0–34.0)
MCHC: 31.3 g/dL (ref 30.0–36.0)
MCV: 101.1 fL — ABNORMAL HIGH (ref 80.0–100.0)
Monocytes Absolute: 0.7 10*3/uL (ref 0.1–1.0)
Monocytes Relative: 6 %
Neutro Abs: 9.5 10*3/uL — ABNORMAL HIGH (ref 1.7–7.7)
Neutrophils Relative %: 85 %
Platelets: 215 10*3/uL (ref 150–400)
RBC: 2.72 MIL/uL — ABNORMAL LOW (ref 3.87–5.11)
RDW: 15.9 % — ABNORMAL HIGH (ref 11.5–15.5)
WBC: 11.2 10*3/uL — ABNORMAL HIGH (ref 4.0–10.5)
nRBC: 0.2 % (ref 0.0–0.2)

## 2022-03-27 LAB — COMPREHENSIVE METABOLIC PANEL
ALT: 49 U/L — ABNORMAL HIGH (ref 0–44)
AST: 67 U/L — ABNORMAL HIGH (ref 15–41)
Albumin: 3.1 g/dL — ABNORMAL LOW (ref 3.5–5.0)
Alkaline Phosphatase: 104 U/L (ref 38–126)
Anion gap: 11 (ref 5–15)
BUN: 63 mg/dL — ABNORMAL HIGH (ref 8–23)
CO2: 30 mmol/L (ref 22–32)
Calcium: 8.6 mg/dL — ABNORMAL LOW (ref 8.9–10.3)
Chloride: 101 mmol/L (ref 98–111)
Creatinine, Ser: 0.79 mg/dL (ref 0.44–1.00)
GFR, Estimated: >60 mL/min (ref >60)
Glucose, Bld: 127 mg/dL — ABNORMAL HIGH (ref 70–99)
Potassium: 3.8 mmol/L (ref 3.5–5.1)
Sodium: 142 mmol/L (ref 135–145)
Total Bilirubin: 0.5 mg/dL (ref 0.3–1.2)
Total Protein: 6.2 g/dL — ABNORMAL LOW (ref 6.5–8.1)

## 2022-03-27 LAB — MAGNESIUM: Magnesium: 2.3 mg/dL (ref 1.7–2.4)

## 2022-03-27 LAB — GLUCOSE, CAPILLARY
Glucose-Capillary: 168 mg/dL — ABNORMAL HIGH (ref 70–99)
Glucose-Capillary: 234 mg/dL — ABNORMAL HIGH (ref 70–99)
Glucose-Capillary: 317 mg/dL — ABNORMAL HIGH (ref 70–99)
Glucose-Capillary: 78 mg/dL (ref 70–99)

## 2022-03-27 LAB — PHOSPHORUS: Phosphorus: 3.6 mg/dL (ref 2.5–4.6)

## 2022-03-27 MED ORDER — IPRATROPIUM BROMIDE 0.02 % IN SOLN
0.5000 mg | Freq: Three times a day (TID) | RESPIRATORY_TRACT | Status: DC
  Administered 2022-03-28 – 2022-03-31 (×10): 0.5 mg via RESPIRATORY_TRACT
  Filled 2022-03-27 (×10): qty 2.5

## 2022-03-27 MED ORDER — TRAVASOL 10 % IV SOLN
INTRAVENOUS | Status: AC
  Filled 2022-03-27: qty 998.4

## 2022-03-27 MED ORDER — LEVALBUTEROL HCL 0.63 MG/3ML IN NEBU
0.6300 mg | INHALATION_SOLUTION | Freq: Three times a day (TID) | RESPIRATORY_TRACT | Status: DC
  Administered 2022-03-28 – 2022-03-31 (×10): 0.63 mg via RESPIRATORY_TRACT
  Filled 2022-03-27 (×10): qty 3

## 2022-03-27 MED ORDER — DEXAMETHASONE SODIUM PHOSPHATE 4 MG/ML IJ SOLN
4.0000 mg | INTRAMUSCULAR | Status: DC
  Administered 2022-03-27 – 2022-04-09 (×14): 4 mg via INTRAVENOUS
  Filled 2022-03-27 (×14): qty 1

## 2022-03-27 NOTE — Progress Notes (Signed)
PHARMACY - TOTAL PARENTERAL NUTRITION CONSULT NOTE   Indication:  chronic severe malnutrition with desquamating rash  Patient Measurements: Height: 5\' 8"  (172.7 cm) Weight: 66.7 kg (147 lb 0.8 oz) IBW/kg (Calculated) : 63.9 TPN AdjBW (KG): 46 Body mass index is 22.36 kg/m.  Assessment: 67 year old female with severe malnutrition related to chronic illness. Patient with desquamating rash likely related to significant nutritional/vitamin deficiencies. Pharmacy consulted to dose TPN to accelerate improvement in nutritional deficiencies. Initially planning for 7-10 days of TPN.  Glucose / Insulin: no hx diabetes. CBG goal <150. - insulin added to TPN 1/10; CBGs trending down with increased amount of insulin in TPN 1/12 - Dexamethasone 8 mg IV q24h started 1/9, decreased 4mg  q24h today. - sSSI: 15 units/24 hr on resistant SSI q6h Electrolytes: All WNL including CorrCa (no Phos or Ca in TPN, Mg decreased yesterday) Renal: SCr WNL, BUN elevated and continues to trend up Hepatic: AST/ALT slightly elevated, albumin low but improved Intake / Output:  - UOP: 3050 mL + 1 unmeasured; stool: 2 unmeasured - No mIVF - Lasix 60mg  IV q12h, significant volume overload noted, weight up 66.7kg GI Imaging: NA GI Surgeries / Procedures: NA  Central access: 1/3 TPN start date: 1/3  Nutritional Goals: Goal TPN rate is 80 mL/hr (provides 100 g of protein and 1954 kcals per day)  RD Assessment: Estimated Needs Total Energy Estimated Needs: 1900-2100 Total Protein Estimated Needs: 95-105g Total Fluid Estimated Needs: 2L/day  Current Nutrition: regular diet ordered, but no intake charted in past 24hr -Pt reports poor appetite. Minimal PO intake. -Enteral supplements:  Ensure Plus TID, Prosource BID (pt had 3 Ensure, both Prosource yesterday)  Plan:  Per discussion with TRH on 1/9, planning for a 10 day duration of TPN. Today is day #10. Will follow along for ongoing [place] discussions and duration of  therapy.  At 1800: Continue TPN at goal rate 80 mL/hr Electrolytes in TPN:  Na 150 mEq/L,  K 40 mEq/L,  Ca 0 mEq/L,  Mg 3 mEq/L,  Phos 0 mmol/L. Cl:Ac 1:1 Add standard MVI and trace elements to TPN (provides 60 mcg selenium, adequate per discussion with RD) Add thiamine 841 mg and folic acid 1 mg to TPN Remove insulin from TPN. Continue CBG checks q6h + resistant SSI Monitor TPN labs on Mon/Thurs. Recheck electrolytes with AM labs tomorrow.  Peggyann Juba, PharmD, BCPS Pharmacy: [phone] 03/27/22 8:00 AM

## 2022-03-27 NOTE — Progress Notes (Signed)
Palliative Care Progress Note  Carolyn [name] is dramatically improved since I saw her 1/9.She seems to have responded well to the trial of steroids and supplementation with T3. Her mood and affect are better and she reports feeling more hopeful.She is eating now and tolerating TPN supplementation. She has a long history of opioid use related to prior gastric surgery and abdominal pain from chronic pancreatitis. She has a history of disordered eating and opioid use disorder which we briefly discussed related to her future pain management and malnutrition. She remains massively volume over loaded based on her BP readings and peripheral edema. Her BNP was very elevated and does not have a pre-existing dx of heart failure.   Assessment: Palliative care was consulted for symptom management and for establishing goals of care.  Acute on Chronic Pain:  Maintain decadron, reduce to 4mg  daily- long taper over several weeks Increase duragesic to 74mcg  Maintain hydromorphone PRN She would be a good candidate for ketamine to reduce her opioid requirements.Will stabilize her acute problems and consider this her opioid needs continue to increase. Would probably benefit from clonidine for adjuvant pain control-may add if her IV use remains the same despite duragesic titration. Anxiety Added PM dose of phenobarbital and she responded well-she reports that is has been the best quality sleep she has had since being hospitalized. Trying to avoid benzodiazapines given her liver dx. Continue aggressive diuresis with albumin lasix runs - her urine output is very good. 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 She had a gastric feeding tube for 20 years. Encourage PO intake. She understands TPN is not long term.  Will follow closely. Discussed care plan with patient, her spouse, and  Therapist, sports.  Lane Hacker, DO Palliative Medicine   Time 50 min

## 2022-03-27 NOTE — Progress Notes (Signed)
PROGRESS NOTE    Carolyn [name]  NFA:[id number] DOB: 19-Nov-1955 DOA: 03/13/2022 PCP: Pcp, No   Brief Narrative:  The patient is a 67 year old female, past medical history of chronic pancreatitis, history of prior alcoholism, gastroesophageal reflux, prior vitamin D deficiency documented. She has known malabsorption issues and has been on Creon. She presents with cellulitis and sepsis related to a ongoing rash of the hands and feet. It appears that the rash has been going on for several weeks continued to get worse. As started off with a small blister the blister then ruptured the skin started to dry out crack and flake. She is starting to flake and desquamate portions of the hands and feet. She developed increased pain and swelling in the right hand that developed redness that tracks into the forearm. She presents to the ER with an elevated white count and was treated with antibiotics for concern of sepsis.     **Interim History Patient was admitted by the critical care team and then subsequently transferred to the [facility] service on 01/14/2021 after she was stabilized.  She continues have significant pain and GI and palliative care been consulted.  Patient was transfused 3 units of PRBCs while she was hospitalized given her trending hemoglobin.  Care has now been adjusting her pain medications and IV Decadron.  GI is doing further workup and monitoring and CT scan of the abdomen chest and pelvis to evaluate for other causes of weight loss and ruling out malignancy.   See below for CT results.  Patient continues to still be volume overloaded so we will continue IV diuresis but will increase the dose of diuresis from IV 40 every 12 to IV 60 every 12h given that she remains significantly volume overloaded but will may be even consider increasing to 80 twice daily.  She seems to be improving and felt better today with increased fentanyl dose.  Palliative evaluated and feels that she would be a good candidate  for ketamine to reduce her opioid requirements.   Assessment and Plan:  Severe sepsis secondary to desquamative dermatitis with superimposed cellulitis and possible underlying osteomyelitis -Patient noted to have presented with complicated nonhealing desquamative dermatitis involving hands and feet complicated by severe sepsis due to cellulitis/wound infection. -MRI of bilateral hands and feet done without any definite evidence of osteomyelitis. -Patient initially admitted to the critical care service, concern for probable vitamin/nutritional deficiencies versus paraneoplastic syndromes. -Concern for possible nutritional deficiencies, B2, B6, tryptophan, zinc, thiamine etc. could lead to this. -Patient noted to be prior alcoholic now with a chronic pancreatitis on Creon and likely has pre-existing condition of malabsorption as well as possible gastric surgery unsure as to what the patient has had this on not however per PCCM. -Patient placed empirically on IV vancomycin, IV Rocephin and subsequently transition to doxycycline and Augmentin and just completed a 10-day course of antibiotic treatment 03/22/2022 but then IV Unasyn was resumed given concern for likely Aspiration Pneumonia.  -Continue vitamin D3, vitamin B12, zinc, vitamin D3. -Continue vitamin A, selenium. -Will need follow-up with Dermatology (she is followed in [place]) -Will need outpatient follow-up with Podiatry for bone biopsy to evaluate for osteomyelitis. -Outpatient follow-up with ID which had been set for 03/22/2022. -Patient unable to ambulate seen by PT would likely require SNF placement. -Patient continues to have ongoing pain, causing tachycardia, continue current regimen and IV Dilaudid as needed. -Continue Neurontin 300 mg 3 times daily, scheduled Tylenol.  -Consulted palliative care for goals of care  and symptom management and appreciate palliative involvement.  Patient is DNR and they are  recommending medical interventions desired if medically indicated and appropriate. -Given the patient's uncontrolled pain palliative care is increased to hydromorphone to 2 mg every 2 as well as added a fentanyl patch 25 mcg and increased to 50 mcg and added phenobarbital for anxiety nightly and if she tolerates this regular schedule this as needed -Palliative is also added Decadron 8 mg IV daily but is now weaned to 4 mg daily and recommending a long taper over several weeks as well as recommending continuing aggressive diuresis and continue antibiotics and checking T3, T4 as well as cortisol level. -Continue supportive care and if she continues to drop her hemoglobin or develops acute GI bleeding however does not show any improvement in the next 24 to 48 hours then palliative will discuss hospice or end-of-life comfort care options   Acute Hypoxic respiratory failure likely secondary to acute on chronic diastolic CHF exacerbation +/- aspiration pneumonia -Patient noted to have a bout of nausea and emesis yesterday evening, noted to have increased O2 requirements currently on 3 L nasal cannula and was on room air 24 hours ago. -Chest x-ray done yesterday morning with new bilateral patchy airspace opacities possibly due to multifocal infection or pulmonary edema, small bilateral pleural effusions. -SpO2: 96 % O2 Flow Rate (L/min): 3 L/min FiO2 (%): 32 % -BNP noted to be significantly elevated at 3564.6 -IV Albumin had stopped but may consider resuming  -Placed on Lasix 40 mg IV every 12 hours and increased to IV 60, strict I's and O's, daily weights; she is still +8.871 L since admission -Placed empirically on IV Unasyn given likley aspiration  -CT Chest done and showed "Multifocal ground-glass pulmonary infiltrates, new from chest radiograph of 03/15/2022, more prevalent within the lung apices bilaterally, possibly related to atypical infection in the acute setting. Large right and small left pleural  effusions" -ECHO from 01/15/22 showed that the left ventricular ejection fraction was estimated to be 70 to 75% with left ventricular having hyperdynamic function in the left ventricle having no regional wall motion abnormalities.  There is mild left ventricular hypertrophy of the basal septal segment and the left ventricular diastolic parameters are consistent with grade 1 diastolic dysfunction -C/w Dexamethasone 8 mg IV q24h -Repeat chest x-ray in 48 hours and if significant clinical improvement with diuresis would likely stop IV antibiotics at that time. -DG Chest done yesterday showed "Stable heart size. Continued probable underlying moderate congestive heart failure with increased  prominence of a right pleural effusion and stable smaller left pleural effusion. No pneumothorax." -Continue monitor and trend respiratory status as she is improving    History of chronic alcoholic pancreatitis -Continue Creon.    Hepatic cirrhosis with Ascites Abnormal/Elevated LFTs, mild and mildly worsening  -Being followed in the outpatient setting by [facility]. -BP soft and as such diuretics held spironolactone and Lasix held. -Patient with no hepatic encephalopathy, -CT Abdomen and Pelvis done and showed "Marked diffuse subcutaneous body wall edema. Mild ascites. The constellation of findings can be seen the setting of anasarca. Punctate free intraperitoneal gas within the upper abdomen adjacent to numerous surgical clips involving the distal stomach. A site of bowel perforation is not clearly identified and there is no leakage of oral contrast. Correlation for an exogenous source of gas, as can be seen with paracentesis, is recommended. Mild hepatic steatosis. Changes in keeping with chronic pancreatitis." -LFT Trend: Recent Labs  Lab 03/18/22 0411 03/22/22  0234 03/25/22 0400 03/26/22 0323 03/27/22 0355  AST 29 30 33 42* 67*  ALT 20 17 22 30  49*  -Outpatient follow-up with GI.   Gastric chronic  ulcer with contained perforation -Stable. 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   -Hemoglobin seems to be trickling back down again  -Continue PPI twice daily, Pepcid twice daily.  -Consulted with GI to see if repeat EGD needed, further evaluation and management. -GI recommends continuing PPI twice daily, TPN as well as obtaining another Hemoccult which was negative today. -Patient was able to eat a little bit today and feels that her Decadron hydromorphone is helping her. -GI is low suspicion for an actively bleeding gastric ulcer given that she has no overt bleeding and heme-negative stool -GI is ordering a CT Scan of the Chest/Abd/Pelvis to rule out malignancy or other sources of weight loss and this is was done. -CT Chest/Abd/Pelvis was done and showed "Multifocal ground-glass pulmonary infiltrates, new from chest radiograph of 03/15/2022, more prevalent within the lung apices bilaterally, possibly related to atypical infection in the acute setting. Large right and small left pleural effusions. Marked diffuse subcutaneous body wall edema. Mild ascites. The constellation of findings can be seen the setting of anasarca. Punctate free intraperitoneal gas within the upper abdomen adjacent to numerous surgical clips involving the distal stomach. A site of bowel perforation is not clearly identified and  there is no leakage of oral contrast. Correlation for an exogenous source of gas, as can be seen with paracentesis, is recommended. Mild hepatic steatosis. Changes in keeping with chronic pancreatitis. No definite superimposed acute peripancreatic inflammatory changes. Aortic Atherosclerosis"  Hypothyroidism -TSH noted at 33.958 on 03/15/2022. -Continue Synthroid 50 mcg's daily.   -Will need repeat labs in 4 to 6 weeks in the outpatient setting.     Hypophosphatemia Hypomagnesemia -Mag and Phos Trend: Recent Labs  Lab 03/21/22 0309 03/22/22 0234 03/23/22 0347 03/24/22 0152 03/25/22 0400 03/26/22 0323 03/27/22 0355  MG 2.0 1.8 2.0 1.6* 2.4 2.5* 2.3  PHOS  --  2.7 3.3 4.0 5.0* 4.7* 3.6  -Repletion per pharmacy.   -Repeat labs in the AM.     Severe Protein calorie malnutrition -Patient seen by dietitian, PICC line placed and patient started on TPN per pharmacy. 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   -Continuing TPN for total of 10 days but may extend given that she is improving    Anemia of chronic disease/folate deficiency -Hemoglobin noted to go as low as 6.5, status post transfusion 1 unit packed red blood cells.   Hemoglobin noted at 7.1 on 03/20/2022.  Status posttransfusion 2 units packed red blood cells hemoglobin  currently at 8.2 from 10.1 (03/22/2022 ). 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   -Hemoglobin seems to be trickling  back down.   -Hgb/Hct Trend: Recent Labs  Lab 03/21/22 0309 03/22/22 0234 03/23/22 0347 03/24/22 0152 03/25/22 0400 03/26/22 0323 03/27/22 0355  HGB 10.0* 10.1* 8.8* 8.2* 8.1* 8.2* 8.6*  HCT 30.5* 31.3* 28.1* 26.6* 26.6* 26.8* 27.5*  MCV 95.3 96.9 97.9 100.8* 101.9* 100.0 101.1*  -GI consulted for further evaluation and management to see if repeat EGD needed as well.   -Follow H&H, transfusion threshold hemoglobin  < 8.    Right upper extremity swelling and pain -Clinical improvement.   -RUE Dopplers negative for DVT. -Keep right upper extremity elevated. -Patient started on diuretics.   NSVT -Patient noted to have 7 beat run of NSVT on 03/19/2022, patient was asymptomatic.   -Electrolytes repleted potassium today currently at 4.4, magnesium at 2. -Hemoglobin noted at 7.1 on 03/20/2022, status post transfusion 2 units packed red blood cells hemoglobin currently at 8.6 and seems to be trending down.  -Repeat labs in the AM.    DVT prophylaxis: enoxaparin (LOVENOX) injection 40 mg Start: 03/26/22 2200 Place and maintain sequential compression device Start: 03/23/22 1714 SCDs Start: 03/14/22 1801    Code Status: DNR Family Communication: No family present at bedside   Disposition Plan:  Level of care: Telemetry Status is: Inpatient Remains inpatient appropriate because: She continues to be extremely volume overloaded and needs further diuresis as well as further goals of care discussion she remains on TPN   Consultants:  Palliative care medicine PCCM transfer Infectious diseases Gastroenterology  Procedures:  As delineated as above  Antimicrobials:  Anti-infectives (From admission, onward)    Start     Dose/Rate Route Frequency Ordered Stop   03/23/22 1215  Ampicillin-Sulbactam (UNASYN) 3 g in sodium chloride 0.9 % 100 mL IVPB        3 g 200 mL/hr over 30 Minutes Intravenous Every 6 hours 03/23/22 1118     03/19/22 2200  amoxicillin-clavulanate (AUGMENTIN) 875-125 MG  per tablet 1 tablet        1 tablet Oral Every 12 hours 03/19/22 1115 03/22/22 2231   03/19/22 2200  doxycycline (VIBRA-TABS) tablet 100 mg        100 mg Oral Every 12 hours 03/19/22 1115 03/22/22 2230   03/14/22 2200  vancomycin (VANCOREADY) IVPB 500 mg/100 mL  Status:  Discontinued        500 mg 100 mL/hr over 60 Minutes Intravenous Every 24 hours 03/13/22 2049 03/19/22 1116   03/13/22 1915  vancomycin (VANCOCIN) IVPB 1000 mg/200 mL premix        1,000 mg 200 mL/hr over 60 Minutes Intravenous  Once 03/13/22 1908 03/14/22 0103   03/13/22 1615  cefTRIAXone (ROCEPHIN) 2 g in sodium chloride 0.9 % 100 mL IVPB        2 g 200 mL/hr over 30 Minutes Intravenous Every 24 hours 03/13/22 1603 03/19/22 1959       Subjective: Seen and examined at bedside and her pain was doing better and she thinks she is doing better.  Continues to have pain in her feet and her arm and thinks the swelling is improving slowly.  No nausea or vomiting.  Appears in better spirits today.  No other concerns or complaints at this time.  Objective: Vitals:   03/27/22 0431 03/27/22 0903 03/27/22 1200 03/27/22 1303  BP:   (!) 172/97   Pulse:   98   Resp:      Temp:   98.6 F (37 C)   TempSrc:   Oral   SpO2:  94% 94% 96%  Weight: 66.7 kg     Height:        Intake/Output Summary (Last 24 hours) at 03/27/2022 1959 Last data filed at 03/27/2022 1900 Gross per 24 hour  Intake 2024.74 ml  Output 2750 ml  Net -725.26 ml   Filed Weights   03/25/22 0120 03/26/22 0500 03/27/22 0431  Weight: 61.7 kg 66.4 kg 66.7 kg   Examination: Physical Exam:  Constitutional: Thin cachectic and chronically ill-appearing Caucasian female who is much more comfortable today and feels better and looks better. Respiratory: Diminished to auscultation bilaterally with coarse breath sounds with some crackles and some slight rhonchi., no wheezing, rales. Normal respiratory effort and patient is not tachypenic. No accessory muscle use.   Unlabored breathing and has taken off her supplemental oxygen via nasal cannula currently Cardiovascular: RRR, no murmurs / rubs / gallops. S1 and S2 auscultated.  Has 1+ lower extremity edema as well as 1-2+ upper extremity edema worse on the right arm compared to left Abdomen: Soft, non-tender, non-distended. Bowel sounds positive.  GU: Deferred. Musculoskeletal: No clubbing / cyanosis of digits/nails. No joint deformity upper and lower extremities.  Skin: Bilateral feet are wrapped right arm is wrapped. Neurologic: CN 2-12 grossly intact with no focal deficits. Romberg sign cerebellar reflexes not assessed.  Psychiatric: Normal judgment and insight. Alert and oriented x 3. Normal mood and appropriate affect.   Data Reviewed: I have personally reviewed following labs and imaging studies  CBC: Recent Labs  Lab 03/22/22 0234 03/23/22 0347 03/24/22 0152 03/25/22 0400 03/26/22 0323 03/27/22 0355  WBC 13.3* 13.0* 9.4 11.8* 10.3 11.2*  NEUTROABS 10.6*  --  8.5* 10.1* 8.7* 9.5*  HGB 10.1* 8.8* 8.2* 8.1* 8.2* 8.6*  HCT 31.3* 28.1* 26.6* 26.6* 26.8* 27.5*  MCV 96.9 97.9 100.8* 101.9* 100.0 101.1*  PLT 148* 128* 122* 157 190 175   Basic Metabolic Panel: Recent Labs  Lab 03/23/22 0347 03/24/22 0152 03/25/22 0400 03/26/22 0323 03/27/22 0355  NA 136 138 139 141 142  K 4.4 4.1 4.2 4.4 3.8  CL 104 102 102 103 101  CO2 25 27 28 30 30   GLUCOSE 156* 251* 230* 140* 127*  BUN 25* 31* 54* 62* 63*  CREATININE 0.43* 0.62 0.77 0.78 0.79  CALCIUM 8.2* 8.4* 8.7* 8.7* 8.6*  MG 2.0 1.6* 2.4 2.5* 2.3  PHOS 3.3 4.0 5.0* 4.7* 3.6   GFR: Estimated Creatinine Clearance: 71.7 mL/min (by C-G formula based on SCr of 0.79 mg/dL). Liver Function Tests: Recent Labs  Lab 03/22/22 0234 03/25/22 0400 03/26/22 0323 03/27/22 0355  AST 30 33 42* 67*  ALT 17 22 30  49*  ALKPHOS 120 86 86 104  BILITOT 0.4 0.3 0.4 0.5  PROT 4.8* 6.2* 6.1* 6.2*  ALBUMIN <1.5* 3.3* 3.1* 3.1*   No results for input(s):  "LIPASE", "AMYLASE" in the last 168 hours. No results for input(s): "AMMONIA" in the last 168 hours. Coagulation Profile: Recent Labs  Lab 03/23/22 1451  INR 1.3*   Cardiac Enzymes: No results for input(s): "CKTOTAL", "CKMB", "CKMBINDEX", "TROPONINI" in the last 168 hours. BNP (last 3 results) No results for input(s): "PROBNP" in the last 8760 hours. HbA1C: No results for input(s): "HGBA1C" in the  last 72 hours. CBG: Recent Labs  Lab 03/26/22 1153 03/27/22 0038 03/27/22 0522 03/27/22 1159 03/27/22 1801  GLUCAP 94 317* 78 168* 234*   Lipid Profile: No results for input(s): "CHOL", "HDL", "LDLCALC", "TRIG", "CHOLHDL", "LDLDIRECT" in the last 72 hours. Thyroid Function Tests: No results for input(s): "TSH", "T4TOTAL", "FREET4", "T3FREE", "THYROIDAB" in the last 72 hours. Anemia Panel: No results for input(s): "VITAMINB12", "FOLATE", "FERRITIN", "TIBC", "IRON", "RETICCTPCT" in the last 72 hours. Sepsis Labs: No results for input(s): "PROCALCITON", "LATICACIDVEN" in the last 168 hours.  No results found for this or any previous visit (from the past 240 hour(s)).   Radiology Studies: DG CHEST PORT 1 VIEW  Result Date: 03/26/2022 CLINICAL DATA:  Shortness of breath. EXAM: PORTABLE CHEST 1 VIEW COMPARISON:  Chest x-ray from abdominal series on 03/23/2022 FINDINGS: Stable heart size. Continued probable underlying moderate congestive heart failure with increased prominence of a right pleural effusion and stable smaller left pleural effusion. No pneumothorax. IMPRESSION: Continued probable moderate congestive heart failure with increased prominence of a right pleural effusion. Electronically Signed   By: Irish Lack M.D.   On: 03/26/2022 07:52    Scheduled Meds:  (feeding supplement) PROSource Plus  30 mL Oral BID BM   acetaminophen  1,000 mg Oral TID   Chlorhexidine Gluconate Cloth  6 each Topical Daily   cholecalciferol  1,000 Units Oral Daily   dexamethasone (DECADRON)  injection  4 mg Intravenous Q24H   enoxaparin (LOVENOX) injection  40 mg Subcutaneous Q24H   famotidine  10 mg Oral BID   feeding supplement  237 mL Oral TID BM   fentaNYL  1 patch Transdermal Q72H   furosemide  60 mg Intravenous Q12H   gabapentin  300 mg Oral TID   insulin aspart  0-20 Units Subcutaneous Q6H   ipratropium  0.5 mg Nebulization Q6H WA   levalbuterol  0.63 mg Nebulization Q6H WA   levothyroxine  50 mcg Oral Q0600   liothyronine  25 mcg Oral Daily   lipase/protease/amylase  24,000 Units Oral TID WC   metoprolol tartrate  12.5 mg Oral BID   mupirocin cream   Topical Once   pantoprazole (PROTONIX) IV  40 mg Intravenous Q12H   PHENObarbital  32.5 mg Intravenous QHS   pyridOXINE  100 mg Oral Daily   silver sulfADIAZINE   Topical Daily   sodium chloride flush  10-40 mL Intracatheter Q12H   vitamin A  50,000 Units Oral Daily   vitamin B-12  100 mcg Oral Daily   vitamin E  400 Units Oral Daily   zinc sulfate  220 mg Oral Daily   Continuous Infusions:  albumin human 12.5 g (03/27/22 1555)   ampicillin-sulbactam (UNASYN) IV 3 g (03/27/22 1838)   TPN ADULT (ION) 80 mL/hr at 03/27/22 1739    LOS: 13 days   Marguerita Merles, DO [facility] Available via Epic secure chat 7am-7pm After these hours, please refer to coverage provider listed on [email or web address] 03/27/2022, 7:59 PM

## 2022-03-28 ENCOUNTER — Inpatient Hospital Stay (HOSPITAL_COMMUNITY): Payer: Medicare PPO

## 2022-03-28 DIAGNOSIS — K860 Alcohol-induced chronic pancreatitis: Secondary | ICD-10-CM | POA: Diagnosis not present

## 2022-03-28 DIAGNOSIS — D638 Anemia in other chronic diseases classified elsewhere: Secondary | ICD-10-CM | POA: Diagnosis not present

## 2022-03-28 DIAGNOSIS — K255 Chronic or unspecified gastric ulcer with perforation: Secondary | ICD-10-CM | POA: Diagnosis not present

## 2022-03-28 DIAGNOSIS — R634 Abnormal weight loss: Secondary | ICD-10-CM | POA: Diagnosis not present

## 2022-03-28 LAB — CBC WITH DIFFERENTIAL/PLATELET
Abs Immature Granulocytes: 0.18 10*3/uL — ABNORMAL HIGH (ref 0.00–0.07)
Basophils Absolute: 0 10*3/uL (ref 0.0–0.1)
Basophils Relative: 0 %
Eosinophils Absolute: 0.1 10*3/uL (ref 0.0–0.5)
Eosinophils Relative: 1 %
HCT: 27.3 % — ABNORMAL LOW (ref 36.0–46.0)
Hemoglobin: 8.4 g/dL — ABNORMAL LOW (ref 12.0–15.0)
Immature Granulocytes: 2 %
Lymphocytes Relative: 10 %
Lymphs Abs: 1 10*3/uL (ref 0.7–4.0)
MCH: 31.3 pg (ref 26.0–34.0)
MCHC: 30.8 g/dL (ref 30.0–36.0)
MCV: 101.9 fL — ABNORMAL HIGH (ref 80.0–100.0)
Monocytes Absolute: 0.7 10*3/uL (ref 0.1–1.0)
Monocytes Relative: 7 %
Neutro Abs: 7.7 10*3/uL (ref 1.7–7.7)
Neutrophils Relative %: 80 %
Platelets: 240 10*3/uL (ref 150–400)
RBC: 2.68 MIL/uL — ABNORMAL LOW (ref 3.87–5.11)
RDW: 16.6 % — ABNORMAL HIGH (ref 11.5–15.5)
WBC: 9.7 10*3/uL (ref 4.0–10.5)
nRBC: 0 % (ref 0.0–0.2)

## 2022-03-28 LAB — COMPREHENSIVE METABOLIC PANEL
ALT: 36 U/L (ref 0–44)
AST: 42 U/L — ABNORMAL HIGH (ref 15–41)
Albumin: 3.3 g/dL — ABNORMAL LOW (ref 3.5–5.0)
Alkaline Phosphatase: 76 U/L (ref 38–126)
Anion gap: 9 (ref 5–15)
BUN: 66 mg/dL — ABNORMAL HIGH (ref 8–23)
CO2: 34 mmol/L — ABNORMAL HIGH (ref 22–32)
Calcium: 8.6 mg/dL — ABNORMAL LOW (ref 8.9–10.3)
Chloride: 99 mmol/L (ref 98–111)
Creatinine, Ser: 0.75 mg/dL (ref 0.44–1.00)
GFR, Estimated: >60 mL/min (ref >60)
Glucose, Bld: 120 mg/dL — ABNORMAL HIGH (ref 70–99)
Potassium: 3.5 mmol/L (ref 3.5–5.1)
Sodium: 142 mmol/L (ref 135–145)
Total Bilirubin: 0.7 mg/dL (ref 0.3–1.2)
Total Protein: 6 g/dL — ABNORMAL LOW (ref 6.5–8.1)

## 2022-03-28 LAB — PHOSPHORUS: Phosphorus: 3.6 mg/dL (ref 2.5–4.6)

## 2022-03-28 LAB — GLUCOSE, CAPILLARY
Glucose-Capillary: 107 mg/dL — ABNORMAL HIGH (ref 70–99)
Glucose-Capillary: 145 mg/dL — ABNORMAL HIGH (ref 70–99)
Glucose-Capillary: 177 mg/dL — ABNORMAL HIGH (ref 70–99)
Glucose-Capillary: 193 mg/dL — ABNORMAL HIGH (ref 70–99)

## 2022-03-28 LAB — MAGNESIUM: Magnesium: 2.2 mg/dL (ref 1.7–2.4)

## 2022-03-28 MED ORDER — INFLUENZA VAC SPLIT QUAD 0.5 ML IM SUSY
0.5000 mL | PREFILLED_SYRINGE | INTRAMUSCULAR | Status: AC
  Administered 2022-03-29: 0.5 mL via INTRAMUSCULAR
  Filled 2022-03-28: qty 0.5

## 2022-03-28 MED ORDER — TRAVASOL 10 % IV SOLN
INTRAVENOUS | Status: AC
  Filled 2022-03-28: qty 998.4

## 2022-03-28 NOTE — Progress Notes (Addendum)
PROGRESS NOTE    Carolyn [name]  ATF:[id number] DOB: 01-15-1956 DOA: 03/13/2022 PCP: Pcp, No   Brief Narrative:  The patient is a 67 year old female, past medical history of chronic pancreatitis, history of prior alcoholism, gastroesophageal reflux, prior vitamin D deficiency documented. She has known malabsorption issues and has been on Creon. She presents with cellulitis and sepsis related to a ongoing rash of the hands and feet. It appears that the rash has been going on for several weeks continued to get worse. As started off with a small blister the blister then ruptured the skin started to dry out crack and flake. She is starting to flake and desquamate portions of the hands and feet. She developed increased pain and swelling in the right hand that developed redness that tracks into the forearm. She presents to the ER with an elevated white count and was treated with antibiotics for concern of sepsis.     **Interim History Patient was admitted by the critical care team and then subsequently transferred to the [facility] service on 01/14/2021 after she was stabilized.  She continues have significant pain and GI and palliative care been consulted.  Patient was transfused 3 units of PRBCs while she was hospitalized given her trending hemoglobin.  Care has now been adjusting her pain medications and IV Decadron.  GI is doing further workup and monitoring and CT scan of the abdomen chest and pelvis to evaluate for other causes of weight loss and ruling out malignancy.   See below for CT results.  Patient continues to still be volume overloaded so we will continue IV diuresis but will increase the dose of diuresis from IV 40 every 12 to IV 60 every 12h given that she remains significantly volume overloaded but will may be even consider increasing to 80 twice daily.   She seems to be improving and felt better today with increased fentanyl dose.  Palliative evaluated and feels that she would be a good candidate  for ketamine to reduce her opioid requirements.  Assessment and Plan:  Severe sepsis secondary to desquamative dermatitis with superimposed cellulitis and possible underlying osteomyelitis, improved  -Patient noted to have presented with complicated nonhealing desquamative dermatitis involving hands and feet complicated by severe sepsis due to cellulitis/wound infection. -MRI of bilateral hands and feet done without any definite evidence of osteomyelitis. -Patient initially admitted to the critical care service, concern for probable vitamin/nutritional deficiencies versus paraneoplastic syndromes. -Concern for possible nutritional deficiencies, B2, B6, tryptophan, zinc, thiamine etc. could lead to this. -Patient noted to be prior alcoholic now with a chronic pancreatitis on Creon and likely has pre-existing condition of malabsorption as well as possible gastric surgery unsure as to what the patient has had this on not however per PCCM. -Patient placed empirically on IV vancomycin, IV Rocephin and subsequently transition to doxycycline and Augmentin and just completed a 10-day course of antibiotic treatment 03/22/2022 but then IV Unasyn was resumed given concern for likely Aspiration Pneumonia.  -Continue vitamin D3, vitamin B12, zinc, vitamin D3. -Continue vitamin A, selenium. -Will need follow-up with Dermatology (she is followed in [place]) -WBC Trend: Recent Labs  Lab 03/22/22 0234 03/23/22 0347 03/24/22 0152 03/25/22 0400 03/26/22 0323 03/27/22 0355 03/28/22 0305  WBC 13.3* 13.0* 9.4 11.8* 10.3 11.2* 9.7  -Will need outpatient follow-up with Podiatry for bone biopsy to evaluate for osteomyelitis. -Outpatient follow-up with ID which had been set for 03/22/2022. -Patient unable to ambulate seen by PT would likely require SNF placement. -Patient  continues to have ongoing pain, causing tachycardia, continue current regimen and IV Dilaudid as needed. -Continue  Neurontin 300 mg 3 times daily, scheduled Tylenol.  -Consulted palliative care for goals of care and symptom management and appreciate palliative involvement.  Patient is DNR and they are recommending medical interventions desired if medically indicated and appropriate. -Given the patient's uncontrolled pain palliative care is increased to hydromorphone to 2 mg every 2 as well as added a fentanyl patch 25 mcg and increased to 50 mcg and added phenobarbital for anxiety nightly and if she tolerates this regular schedule this as needed -Palliative is also added Decadron 8 mg IV daily but is now weaned to 4 mg daily and recommending a long taper over several weeks as well as recommending continuing aggressive diuresis and continue antibiotics and checking T3, T4 as well as cortisol level. -Continue supportive care and if she continues to drop her hemoglobin or develops acute GI bleeding however does not show any improvement in the next 24 to 48 hours then palliative will discuss hospice or end-of-life comfort care options   Acute Hypoxic respiratory failure likely secondary to acute on chronic diastolic CHF exacerbation +/- aspiration pneumonia -Patient noted to have a bout of nausea and emesis yesterday evening, noted to have increased O2 requirements currently on 3 L nasal cannula and was on room air 24 hours ago. -SpO2: 94 % O2 Flow Rate (L/min): 3 L/min FiO2 (%): 32 % -BNP noted to be significantly elevated at 3564.6 -IV Albumin had stopped but may consider resuming  -Placed on Lasix 40 mg IV every 12 hours and increased to IV 60, strict I's and O's, daily weights; she is still +8.871 L since admission -Placed empirically on IV Unasyn given likley aspiration  -CT Chest done and showed "Multifocal ground-glass pulmonary infiltrates, new from chest radiograph of 03/15/2022, more prevalent within the lung apices bilaterally, possibly related to atypical infection in the acute setting. Large right and  small left pleural effusions" -ECHO from 01/15/22 showed that the left ventricular ejection fraction was estimated to be 70 to 75% with left ventricular having hyperdynamic function in the left ventricle having no regional wall motion abnormalities.  There is mild left ventricular hypertrophy of the basal septal segment and the left ventricular diastolic parameters are consistent with grade 1 diastolic dysfunction -C/w Dexamethasone 8 mg IV q24h -Repeat chest x-ray in 48 hours and if significant clinical improvement with diuresis would likely stop IV antibiotics at that time. -DG Chest done showed "Right larger than left layering pleural effusions with associated bibasilar atelectasis versus infiltrate. Stable cardiomegaly. No significant interval change in the appearance of the lungs." -Continue monitor and trend respiratory status as she is improving    History of chronic alcoholic pancreatitis -Continue Creon.    Hepatic cirrhosis with Ascites Abnormal/Elevated LFTs, mild and mildly worsening  -Being followed in the outpatient setting by [facility]. -BP soft and as such diuretics held spironolactone and Lasix held. -Patient with no hepatic encephalopathy, -CT Abdomen and Pelvis done and showed "Marked diffuse subcutaneous body wall edema. Mild ascites. The constellation of findings can be seen the setting of anasarca. Punctate free intraperitoneal gas within the upper abdomen adjacent to numerous surgical clips involving the distal stomach. A site of bowel perforation is not clearly identified and there is no leakage of oral contrast. Correlation for an exogenous source of gas, as can be seen with paracentesis, is recommended. Mild hepatic steatosis. Changes in keeping with chronic pancreatitis." -LFT Trend: Recent  Labs  Lab 03/18/22 0411 03/22/22 0234 03/25/22 0400 03/26/22 0323 03/27/22 0355 03/28/22 0305  AST 29 30 33 42* 67* 42*  ALT 20 17 22 30  49* 36   -Outpatient follow-up with  GI.   Gastric chronic ulcer with contained perforation -Stable. 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   -Hemoglobin seems to be trickling back down again  -Continue PPI twice daily, Pepcid twice daily.  -Consulted with GI to see if repeat EGD needed, further evaluation and management. -GI recommends continuing PPI twice daily, TPN as well as obtaining another Hemoccult which was negative today. -Patient was able to eat a little bit today and feels that her Decadron hydromorphone is helping her. -GI is low suspicion for an actively bleeding gastric ulcer given that she has no overt bleeding and heme-negative stool -GI is ordering a CT Scan of the Chest/Abd/Pelvis to rule out malignancy or other sources of weight loss and this is was done. -CT Chest/Abd/Pelvis was done and showed "Multifocal ground-glass pulmonary infiltrates, new from chest radiograph of 03/15/2022, more prevalent within the lung apices bilaterally, possibly related to atypical infection in the acute setting. Large right and small left pleural effusions. Marked diffuse subcutaneous body wall edema. Mild ascites. The constellation of findings can be seen the setting of anasarca. Punctate free intraperitoneal gas within the upper abdomen adjacent to numerous surgical clips involving the distal stomach. A site of bowel perforation is not  clearly identified and there is no leakage of oral contrast. Correlation for an exogenous source of gas, as can be seen with paracentesis, is recommended. Mild hepatic steatosis. Changes in keeping with chronic pancreatitis. No definite superimposed acute peripancreatic inflammatory changes. Aortic Atherosclerosis"   Hypothyroidism -TSH noted at 33.958 on 03/15/2022. -Continue Synthroid 50 mcg's daily.   -Will need repeat labs in 4 to 6 weeks in the outpatient setting.     Hypophosphatemia Hypomagnesemia -Mag and Phos Trend: Recent Labs  Lab 03/22/22 0234 03/23/22 0347 03/24/22 0152 03/25/22 0400 03/26/22 0323 03/27/22 0355 03/28/22 0305  MG 1.8 2.0 1.6* 2.4 2.5* 2.3 2.2  PHOS 2.7 3.3 4.0 5.0* 4.7* 3.6 3.6  -Repletion per pharmacy.   -Repeat labs in the AM.     Severe Protein calorie malnutrition -Patient seen by dietitian, PICC line placed and patient started on TPN per pharmacy. 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   -Diagnosed to continue TPN for total of 10 days but may extend given that she is improving and obtaining a calorie count to see   Anemia of chronic disease/folate deficiency -Hemoglobin noted to go as low as 6.5, status post transfusion 1 unit packed red blood cells.  Hemoglobin noted at 7.1 on 03/20/2022.  Status posttransfusion 2 units packed red blood cells hemoglobin currently at 8.2 from 10.1 (03/22/2022 ). 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   -Hemoglobin seems to be trickling back down.   -Hgb/Hct Trend: Recent Labs  Lab 03/22/22 0234 03/23/22 0347 03/24/22 0152 03/25/22 0400 03/26/22 0323 03/27/22 0355 03/28/22 0305  HGB 10.1* 8.8* 8.2* 8.1* 8.2* 8.6* 8.4*  HCT 31.3* 28.1* 26.6* 26.6* 26.8* 27.5* 27.3*  MCV 96.9 97.9 100.8* 101.9* 100.0 101.1* 101.9*  -GI consulted for further evaluation and management to see if repeat EGD needed as well.   -Follow H&H, transfusion threshold hemoglobin  < 8.    Right upper extremity swelling and pain -Clinical improvement.   -RUE Dopplers negative for DVT. -Keep right upper extremity elevated. -Patient started on diuretics.   NSVT -Patient noted to have 7 beat run of NSVT on 03/19/2022, patient was asymptomatic.   -Electrolytes repleted potassium today currently at 4.4, magnesium at 2. -Hemoglobin noted at 7.1 on 03/20/2022, status post transfusion 2 units packed red blood cells hemoglobin currently at 8.6 and seems to be trending down.  -Repeat labs in the AM.     DVT prophylaxis: enoxaparin (LOVENOX) injection 40 mg Start: 03/26/22 2200 Place and maintain sequential compression device Start: 03/23/22 1714 SCDs Start: 03/14/22 1801    Code Status: DNR Family Communication: No family currently at bedside  Disposition Plan:  Level of care: Telemetry Status is: Inpatient Remains inpatient appropriate because: She needs to tolerate a diet properly undergoing calorie count remains significantly volume overloaded so continues to get diuresed   Consultants:  Palliative care medicine PCCM transfer Infectious diseases Gastroenterology  Procedures:  As delineated as above  Antimicrobials:  Anti-infectives (From admission, onward)    Start     Dose/Rate Route Frequency Ordered Stop   03/23/22 1215  Ampicillin-Sulbactam (UNASYN) 3 g in sodium chloride 0.9 % 100 mL IVPB        3 g 200 mL/hr over 30 Minutes Intravenous Every 6 hours 03/23/22 1118      03/19/22 2200  amoxicillin-clavulanate (AUGMENTIN) 875-125 MG per tablet 1 tablet        1 tablet Oral Every 12 hours 03/19/22 1115 03/22/22 2231   03/19/22 2200  doxycycline (VIBRA-TABS) tablet 100 mg        100 mg Oral Every 12 hours 03/19/22 1115 03/22/22 2230   03/14/22 2200  vancomycin (VANCOREADY) IVPB 500 mg/100 mL  Status:  Discontinued        500 mg 100 mL/hr over 60 Minutes Intravenous Every 24 hours 03/13/22 2049 03/19/22 1116   03/13/22 1915  vancomycin (VANCOCIN) IVPB 1000 mg/200 mL premix        1,000 mg 200 mL/hr over 60 Minutes Intravenous  Once 03/13/22 1908 03/14/22 0103   03/13/22 1615  cefTRIAXone (ROCEPHIN) 2 g in sodium chloride 0.9 % 100 mL IVPB        2 g 200 mL/hr over 30 Minutes Intravenous Every 24 hours 03/13/22 1603 03/19/22 1959       Subjective: Seen and examined at bedside and she was having some pain today and states that she did not feel as good as she did yesterday.  Thinks that her swelling is slowly improving.  No nausea or vomiting.  No lightheadedness or dizziness and states  that she is eating okay.  No other concerns or complaints at this time.  Objective: Vitals:   03/28/22 0500 03/28/22 0626 03/28/22 0822 03/28/22 1149  BP:  (!) 147/101  (!) 155/127  Pulse:  (!) 102  100  Resp:  16    Temp:  98.5 F (36.9 C) 98.3 F (36.8 C) 98.2 F (36.8 C)  TempSrc:  Oral Oral Oral  SpO2:  92% 94%   Weight: 66.4 kg     Height:        Intake/Output Summary (Last 24 hours) at 03/28/2022 1558 Last data filed at 03/28/2022 1300 Gross per 24 hour  Intake 1482.16 ml  Output 6050 ml  Net -4567.84 ml   Filed Weights   03/26/22 0500 03/27/22 0431 03/28/22 0500  Weight: 66.4 kg 66.7 kg 66.4 kg   Examination: Physical Exam:  Constitutional: Thin cachectic and chronically ill-appearing Caucasian female who is in some mild distress appears a little uncomfortable Respiratory: Diminished to auscultation bilaterally, no wheezing, rales, rhonchi or  crackles. Normal respiratory effort and patient is not tachypenic. No accessory muscle use.  Unlabored breathing and not wearing supplemental oxygen is Cardiovascular: Tachycardic rate but regular rhythm, no murmurs / rubs / gallops. S1 and S2 auscultated.  Continues to have 1+ lower extremity edema as well as 1-2+ upper extremity edema worse on the right compared to left Abdomen: Soft, non-tender, non-distended. Bowel sounds positive.  GU: Deferred. Musculoskeletal: No clubbing / cyanosis of digits/nails. No joint deformity upper and lower extremities.  Skin: Right arm is wrapped as well as bilateral feet.  Has some bruising and some dry skin Neurologic: CN 2-12 grossly intact with no focal deficits. Romberg sign and cerebellar reflexes not assessed.  Psychiatric: Normal judgment and insight. Alert and oriented x 3. Normal mood and appropriate affect.   Data Reviewed: I have personally reviewed following labs and imaging studies  CBC: Recent Labs  Lab 03/24/22 0152 03/25/22 0400 03/26/22 0323 03/27/22 0355 03/28/22 0305  WBC 9.4 11.8* 10.3 11.2* 9.7  NEUTROABS 8.5* 10.1* 8.7* 9.5* 7.7  HGB 8.2* 8.1* 8.2* 8.6* 8.4*  HCT 26.6* 26.6* 26.8* 27.5* 27.3*  MCV 100.8* 101.9* 100.0 101.1* 101.9*  PLT 122* 157 190 215 240   Basic Metabolic Panel: Recent Labs  Lab 03/24/22 0152 03/25/22 0400 03/26/22 0323 03/27/22 0355 03/28/22 0305  NA 138 139 141 142 142  K 4.1 4.2 4.4 3.8 3.5  CL 102 102 103 101 99  CO2 27 28 30 30  34*  GLUCOSE 251* 230* 140* 127* 120*  BUN 31* 54* 62* 63* 66*  CREATININE 0.62 0.77 0.78 0.79 0.75  CALCIUM 8.4* 8.7* 8.7* 8.6* 8.6*  MG 1.6* 2.4 2.5* 2.3 2.2  PHOS 4.0 5.0* 4.7* 3.6 3.6   GFR: Estimated Creatinine Clearance: 71.7 mL/min (by C-G formula based on SCr of 0.75 mg/dL). Liver Function Tests: Recent Labs  Lab 03/22/22 0234 03/25/22 0400 03/26/22 0323 03/27/22 0355 03/28/22 0305  AST 30 33 42* 67* 42*  ALT 17 22 30  49* 36  ALKPHOS 120 86 86 104  76  BILITOT 0.4 0.3 0.4 0.5 0.7  PROT 4.8* 6.2* 6.1* 6.2* 6.0*  ALBUMIN <1.5* 3.3* 3.1* 3.1* 3.3*   No results for input(s): "LIPASE", "AMYLASE" in the last 168 hours. No results for input(s): "AMMONIA" in the last 168 hours. Coagulation Profile: Recent Labs  Lab 03/23/22 1451  INR 1.3*   Cardiac Enzymes: No results for input(s): "CKTOTAL", "CKMB", "CKMBINDEX", "TROPONINI" in the last 168 hours. BNP (  last 3 results) No results for input(s): "PROBNP" in the last 8760 hours. HbA1C: No results for input(s): "HGBA1C" in the last 72 hours. CBG: Recent Labs  Lab 03/27/22 1159 03/27/22 1801 03/28/22 0008 03/28/22 0554 03/28/22 1219  GLUCAP 168* 234* 177* 145* 193*   Lipid Profile: No results for input(s): "CHOL", "HDL", "LDLCALC", "TRIG", "CHOLHDL", "LDLDIRECT" in the last 72 hours. Thyroid Function Tests: No results for input(s): "TSH", "T4TOTAL", "FREET4", "T3FREE", "THYROIDAB" in the last 72 hours. Anemia Panel: No results for input(s): "VITAMINB12", "FOLATE", "FERRITIN", "TIBC", "IRON", "RETICCTPCT" in the last 72 hours. Sepsis Labs: No results for input(s): "PROCALCITON", "LATICACIDVEN" in the last 168 hours.  No results found for this or any previous visit (from the past 240 hour(s)).   Radiology Studies: DG CHEST PORT 1 VIEW  Result Date: 03/28/2022 CLINICAL DATA:  Short of breath, dyspnea EXAM: PORTABLE CHEST 1 VIEW COMPARISON:  Prior chest x-ray 03/26/2022 FINDINGS: Stable cardiomegaly. Moderately large layering right pleural effusion. Small left pleural effusion. Associated bibasilar airspace opacities. No pneumothorax. No acute osseous abnormality. IMPRESSION: 1. Right larger than left layering pleural effusions with associated bibasilar atelectasis versus infiltrate. 2. Stable cardiomegaly. 3. No significant interval change in the appearance of the lungs. Electronically Signed   By: Malachy Moan M.D.   On: 03/28/2022 07:38    Scheduled Meds:  (feeding  supplement) PROSource Plus  30 mL Oral BID BM   acetaminophen  1,000 mg Oral TID   Chlorhexidine Gluconate Cloth  6 each Topical Daily   cholecalciferol  1,000 Units Oral Daily   dexamethasone (DECADRON) injection  4 mg Intravenous Q24H   enoxaparin (LOVENOX) injection  40 mg Subcutaneous Q24H   famotidine  10 mg Oral BID   feeding supplement  237 mL Oral TID BM   fentaNYL  1 patch Transdermal Q72H   furosemide  60 mg Intravenous Q12H   gabapentin  300 mg Oral TID   [START ON 03/29/2022] influenza vac split quadrivalent PF  0.5 mL Intramuscular Tomorrow-1000   insulin aspart  0-20 Units Subcutaneous Q6H   ipratropium  0.5 mg Nebulization TID   levalbuterol  0.63 mg Nebulization TID   levothyroxine  50 mcg Oral Q0600   liothyronine  25 mcg Oral Daily   lipase/protease/amylase  24,000 Units Oral TID WC   metoprolol tartrate  12.5 mg Oral BID   mupirocin cream   Topical Once   pantoprazole (PROTONIX) IV  40 mg Intravenous Q12H   PHENObarbital  32.5 mg Intravenous QHS   pyridOXINE  100 mg Oral Daily   silver sulfADIAZINE   Topical Daily   sodium chloride flush  10-40 mL Intracatheter Q12H   vitamin A  50,000 Units Oral Daily   vitamin B-12  100 mcg Oral Daily   vitamin E  400 Units Oral Daily   zinc sulfate  220 mg Oral Daily   Continuous Infusions:  albumin human 12.5 g (03/28/22 0646)   ampicillin-sulbactam (UNASYN) IV 3 g (03/28/22 1518)   TPN ADULT (ION) 80 mL/hr at 03/28/22 0141   TPN ADULT (ION)      LOS: 14 days   Marguerita Merles, DO [facility] Available via Epic secure chat 7am-7pm After these hours, please refer to coverage provider listed on [email or web address] 03/28/2022, 3:58 PM

## 2022-03-28 NOTE — Progress Notes (Signed)
PHARMACY - TOTAL PARENTERAL NUTRITION CONSULT NOTE   Indication:  chronic severe malnutrition with desquamating rash  Patient Measurements: Height: 5\' 8"  (172.7 cm) Weight: 66.4 kg (146 lb 6.2 oz) IBW/kg (Calculated) : 63.9 TPN AdjBW (KG): 46 Body mass index is 22.26 kg/m.  Assessment: 67 year old female with severe malnutrition related to chronic illness. Patient with desquamating rash likely related to significant nutritional/vitamin deficiencies. Pharmacy consulted to dose TPN to accelerate improvement in nutritional deficiencies. Initially planning for 7-10 days of TPN.  Glucose / Insulin: no hx diabetes. CBG goal <150. - insulin added to TPN 1/10; CBGs trending down with increased amount of insulin in TPN 1/12 so removed 1/13. Now rising again with range 145-234. - Dexamethasone 8 mg IV q24h started 1/9, decreased 4mg  q24h yesterday. - sSSI: 18 units/24 hr on resistant SSI q6h Electrolytes: All WNL including CorrCa (no Phos or Ca in TPN) Renal: SCr WNL, BUN elevated and continues to trend up Hepatic: AST/ALT slightly elevated but improved, albumin low but improved Intake / Output:  - UOP: 3250 mL, 2 BMs on 1/12 - No mIVF - Lasix 60mg  IV q12h + Albumin q8h, significant volume overload noted, weight up 66.4kg GI Imaging: NA GI Surgeries / Procedures: NA  Central access: 1/3 TPN start date: 1/3  Nutritional Goals: Goal TPN rate is 80 mL/hr (provides 100 g of protein and 1954 kcals per day)  RD Assessment: Estimated Needs Total Energy Estimated Needs: 1900-2100 Total Protein Estimated Needs: 95-105g Total Fluid Estimated Needs: 2L/day  Current Nutrition: regular diet ordered, but no intake charted in past 24hr -MD reports intake improving -Enteral supplements:  Ensure Plus TID (refused one yesterday), Prosource BID   Plan:  Per discussion with TR[facility] on 1/9, planning for a 10 day duration of TPN. Today is day #11. Per discussion with TRH today 1/14, continue with TPN today  and plan for calorie count to assess intake.  At 1800: Continue TPN at goal rate 80 mL/hr Electrolytes in TPN:  Na 150 mEq/L,  K 50 mEq/L (increase),  Ca 0 mEq/L,  Mg 3 mEq/L,  Phos 0 mmol/L. Cl:Ac 1:1 Add standard MVI and trace elements to TPN (provides 60 mcg selenium, adequate per discussion with RD) Add thiamine 829 mg and folic acid 1 mg to TPN Add back 10 units of insulin to TPN. Continue CBG checks q6h + resistant SSI Monitor TPN labs on Mon/Thurs.  Peggyann Juba, PharmD, BCPS Pharmacy: [phone] 03/28/22 8:51 AM

## 2022-03-28 NOTE — Progress Notes (Addendum)
Nutrition Follow-up RD working remotely.   DOCUMENTATION CODES:   Severe malnutrition in context of chronic illness, Underweight  INTERVENTION:  - Calorie Count start 1/14 with RD follow-up 1/15 and 1/16 with results --communicated with RN via secure chat about initiation of Calorie Count.  - continue Ensure TID and 30 ml Prosource Plus BID.  - recommend adjusting creon dosage to 36000 lipase units TID--communicated with MD via secure chat.   NUTRITION DIAGNOSIS:   Severe Malnutrition related to chronic illness (chronic pancreatitis, h/o multiple gastric surgeries including gastric bypass) as evidenced by severe fat depletion, severe muscle depletion. -ongoing  GOAL:   Patient will meet greater than or equal to 90% of their needs -unmet  MONITOR:   PO intake, Supplement acceptance, Weight trends, Labs, I & O's, Skin  REASON FOR ASSESSMENT:   Consult Assessment of nutrition requirement/status, Calorie Count  ASSESSMENT:   67 year old female, past medical history of chronic pancreatitis, history of prior alcoholism, gastroesophageal reflux, prior vitamin D deficiency documented.  She has known malabsorption issues and has been on Creon.  She presents with cellulitis and sepsis related to a ongoing rash of the hands and feet. She presents to the ER with an elevated white count and was treated with antibiotics for concern of sepsis.  Noted that vitamin C result was unable to be concluded; test not performed. No meal intake percentages documented in the flow sheet since breakfast on 1/11.Patient is on a Regular, thin liquids diet.  Double lumen PICC placed in L brachial on 03/17/22 and she is receiving TPN at goal rate of 80 ml/hr which is providing 1954 kcal and 100 grams protein.   Patient was assessed by Palliative Care on 1/9 and 1/12. She was last assessed in person by a RD on 1/9.   She continues to accept Prosource Plus and Ensure each time they are offered to  her.  Weight +17 lb since 1/9. Moderate pitting edema to all extremities and sacral area and mild pitting edema to perineal area documented in the edema section of flow sheet.    Labs reviewed; CBGs: 177 and 145 mg/dl, BUN: 66 mg/dl, Ca: 8.6 mg/dl, AST elevated.  Medications reviewed; 12.5 g albumin TID, 1000 units cholecalciferol/day, 10 mg pepcid BID, 60 mg IV lasix BID, sliding scale novolog, 50 mcg oral levothyroxine/day, 24000 lipase units creon TID, 40 mg IV protonix BID, 100 mg vitamin B6/day, 50000 units vitamin A/day x14 days starting 1/8, 100 mcg oral cyanocobalamin/day, 400 units vitamin E/day, 220 mg zinc sulfate/day.   Diet Order:   Diet Order             Diet regular Room service appropriate? Yes; Fluid consistency: Thin  Diet effective now                   EDUCATION NEEDS:   Education needs have been addressed  Skin:  Skin Assessment: Skin Integrity Issues: Skin Integrity Issues:: Other (Comment) Other: red, flaky rashes to bilateral heels, feet and hands  Last BM:  1/13 (type 6 x3, one small amount, one medium amount, one large amount)  Height:   Ht Readings from Last 1 Encounters:  03/16/22 5\' 8"  (1.727 m)    Weight:   Wt Readings from Last 1 Encounters:  03/28/22 66.4 kg    BMI:  Body mass index is 22.26 kg/m.  Estimated Nutritional Needs:  Kcal:  1900-2100 Protein:  95-105g Fluid:  2L/day     Jarome Matin, MS, RD, LDN, CNSC Clinical  Dietitian PRN/Relief staff On-call/weekend pager # available in [facility]

## 2022-03-29 DIAGNOSIS — K860 Alcohol-induced chronic pancreatitis: Secondary | ICD-10-CM | POA: Diagnosis not present

## 2022-03-29 DIAGNOSIS — D638 Anemia in other chronic diseases classified elsewhere: Secondary | ICD-10-CM | POA: Diagnosis not present

## 2022-03-29 DIAGNOSIS — R634 Abnormal weight loss: Secondary | ICD-10-CM | POA: Diagnosis not present

## 2022-03-29 DIAGNOSIS — K255 Chronic or unspecified gastric ulcer with perforation: Secondary | ICD-10-CM | POA: Diagnosis not present

## 2022-03-29 LAB — COMPREHENSIVE METABOLIC PANEL
ALT: 34 U/L (ref 0–44)
AST: 39 U/L (ref 15–41)
Albumin: 3.5 g/dL (ref 3.5–5.0)
Alkaline Phosphatase: 72 U/L (ref 38–126)
Anion gap: 10 (ref 5–15)
BUN: 64 mg/dL — ABNORMAL HIGH (ref 8–23)
CO2: 34 mmol/L — ABNORMAL HIGH (ref 22–32)
Calcium: 8.8 mg/dL — ABNORMAL LOW (ref 8.9–10.3)
Chloride: 98 mmol/L (ref 98–111)
Creatinine, Ser: 0.72 mg/dL (ref 0.44–1.00)
GFR, Estimated: >60 mL/min (ref >60)
Glucose, Bld: 136 mg/dL — ABNORMAL HIGH (ref 70–99)
Potassium: 3.5 mmol/L (ref 3.5–5.1)
Sodium: 142 mmol/L (ref 135–145)
Total Bilirubin: 0.8 mg/dL (ref 0.3–1.2)
Total Protein: 5.9 g/dL — ABNORMAL LOW (ref 6.5–8.1)

## 2022-03-29 LAB — CBC WITH DIFFERENTIAL/PLATELET
Abs Immature Granulocytes: 0.09 10*3/uL — ABNORMAL HIGH (ref 0.00–0.07)
Basophils Absolute: 0 10*3/uL (ref 0.0–0.1)
Basophils Relative: 0 %
Eosinophils Absolute: 0.1 10*3/uL (ref 0.0–0.5)
Eosinophils Relative: 1 %
HCT: 27 % — ABNORMAL LOW (ref 36.0–46.0)
Hemoglobin: 8.3 g/dL — ABNORMAL LOW (ref 12.0–15.0)
Immature Granulocytes: 1 %
Lymphocytes Relative: 9 %
Lymphs Abs: 0.9 10*3/uL (ref 0.7–4.0)
MCH: 30.9 pg (ref 26.0–34.0)
MCHC: 30.7 g/dL (ref 30.0–36.0)
MCV: 100.4 fL — ABNORMAL HIGH (ref 80.0–100.0)
Monocytes Absolute: 0.6 10*3/uL (ref 0.1–1.0)
Monocytes Relative: 6 %
Neutro Abs: 8.1 10*3/uL — ABNORMAL HIGH (ref 1.7–7.7)
Neutrophils Relative %: 83 %
Platelets: 220 10*3/uL (ref 150–400)
RBC: 2.69 MIL/uL — ABNORMAL LOW (ref 3.87–5.11)
RDW: 16.6 % — ABNORMAL HIGH (ref 11.5–15.5)
WBC: 9.8 10*3/uL (ref 4.0–10.5)
nRBC: 0 % (ref 0.0–0.2)

## 2022-03-29 LAB — TRIGLYCERIDES: Triglycerides: 59 mg/dL (ref <150)

## 2022-03-29 LAB — GLUCOSE, CAPILLARY
Glucose-Capillary: 146 mg/dL — ABNORMAL HIGH (ref 70–99)
Glucose-Capillary: 179 mg/dL — ABNORMAL HIGH (ref 70–99)
Glucose-Capillary: 192 mg/dL — ABNORMAL HIGH (ref 70–99)
Glucose-Capillary: 216 mg/dL — ABNORMAL HIGH (ref 70–99)
Glucose-Capillary: 342 mg/dL — ABNORMAL HIGH (ref 70–99)

## 2022-03-29 LAB — PHOSPHORUS: Phosphorus: 4.2 mg/dL (ref 2.5–4.6)

## 2022-03-29 LAB — MAGNESIUM: Magnesium: 2.1 mg/dL (ref 1.7–2.4)

## 2022-03-29 MED ORDER — PANTOPRAZOLE SODIUM 40 MG PO TBEC
40.0000 mg | DELAYED_RELEASE_TABLET | Freq: Two times a day (BID) | ORAL | Status: DC
  Administered 2022-03-29 – 2022-04-12 (×29): 40 mg via ORAL
  Filled 2022-03-29 (×30): qty 1

## 2022-03-29 MED ORDER — TRAVASOL 10 % IV SOLN
INTRAVENOUS | Status: AC
  Filled 2022-03-29: qty 998.4

## 2022-03-29 MED ORDER — FUROSEMIDE 10 MG/ML IJ SOLN
80.0000 mg | Freq: Two times a day (BID) | INTRAMUSCULAR | Status: DC
  Administered 2022-03-29 – 2022-04-03 (×10): 80 mg via INTRAVENOUS
  Filled 2022-03-29 (×10): qty 8

## 2022-03-29 NOTE — Progress Notes (Signed)
Physical Therapy Treatment Patient Details Name: Carolyn [name] MRN: [id number] DOB: April 03, 1955 Today's Date: 03/29/2022   History of Present Illness 67 year old female with history of chronic pancreatitis, chronic pain syndrome, GERD, severe malnutrition, osteoporosis who presented to [facility] ED with LE "cellulitis due to breakdown from desquammating wounds" involving bil feet and to lesser extent hands.  Pt admitted 03/13/22 for severe sepsis secondary to discriminative dermatitis with superimposed cellulitis and possible underlying osteomyelitis    PT Comments    Pt very agreeable to attempt OOB to recliner.  Pt in good spirits and reports good pain control today.  Pt sat EOB and attempted multiple times to scoot over to recliner however unable.  Pt assisted back to supine (no +2 assist available to help pt over to recliner).      Recommendations for follow up therapy are one component of a multi-disciplinary discharge planning process, led by the attending physician.  Recommendations may be updated based on patient status, additional functional criteria and insurance authorization.  Follow Up Recommendations  Skilled nursing-short term rehab (<3 hours/day) Can patient physically be transported by private vehicle: No   Assistance Recommended at Discharge Frequent or constant Supervision/Assistance  Patient can return home with the following Two people to help with walking and/or transfers;A lot of help with bathing/dressing/bathroom;Assistance with cooking/housework;Assist for transportation;Help with stairs or ramp for entrance   Equipment Recommendations  None recommended by PT    Recommendations for Other Services       Precautions / Restrictions Precautions Precautions: Fall     Mobility  Bed Mobility Overal bed mobility: Needs Assistance Bed Mobility: Supine to Sit, Sit to Supine     Supine to sit: Supervision Sit to supine: Supervision   General bed mobility comments:  slow, cautious movement due to pain; assist for line management; pt able to perform pulling herself up [facility] with bed positioned in trendelenberg    Transfers Overall transfer level: Needs assistance   Transfers: Bed to chair/wheelchair/BSC            Lateral/Scoot Transfers: Total assist General transfer comment: pt did attempt weight shifting however unable to move, attempted for 3 minutes with feet down and then even sideways in bed with recliner piece elevated but pt unable to perform without assist (no +2 assist available at the time so returned to bed), SPO2 93% on 2L O2 [place] during pt mobilizing; pt was able to tolerate feet on the floor today    Ambulation/Gait                   Stairs             Wheelchair Mobility    Modified Rankin (Stroke Patients Only)       Balance   Sitting-balance support: No upper extremity supported, Feet supported Sitting balance-Leahy Scale: Fair                                      Cognition Arousal/Alertness: Awake/alert Behavior During Therapy: WFL for tasks assessed/performed Overall Cognitive Status: Within Functional Limits for tasks assessed                                          Exercises      General Comments        Pertinent  Vitals/Pain Pain Assessment Pain Assessment: 0-10 Pain Score: 5  Pain Location: bilateral feet and hands Pain Descriptors / Indicators: Tender, Discomfort Pain Intervention(s): Repositioned, Monitored during session    Home Living                          Prior Function            PT Goals (current goals can now be found in the care plan section) Acute Rehab PT Goals PT Goal Formulation: With patient Time For Goal Achievement: 04/12/22 Potential to Achieve Goals: Fair Progress towards PT goals: Progressing toward goals    Frequency    Min 2X/week      PT Plan Current plan remains appropriate    Co-evaluation               AM-PAC PT "6 Clicks" Mobility   Outcome Measure  Help needed turning from your back to your side while in a flat bed without using bedrails?: A Little Help needed moving from lying on your back to sitting on the side of a flat bed without using bedrails?: A Little Help needed moving to and from a bed to a chair (including a wheelchair)?: Total Help needed standing up from a chair using your arms (e.g., wheelchair or bedside chair)?: Total Help needed to walk in hospital room?: Total Help needed climbing 3-5 steps with a railing? : Total 6 Click Score: 10    End of Session Equipment Utilized During Treatment: Oxygen Activity Tolerance: Patient tolerated treatment well Patient left: in bed;with call bell/phone within reach   PT Visit Diagnosis: Difficulty in walking, not elsewhere classified (R26.2)     Time: 6962-9528 PT Time Calculation (min) (ACUTE ONLY): 26 min  Charges:  $Therapeutic Activity: 23-37 mins                     Jannette Spanner PT, DPT Physical Therapist Acute Rehabilitation Services Preferred contact method: Secure Chat Weekend Pager Only: [phone] Office: [place] 03/29/2022, 1:28 PM

## 2022-03-29 NOTE — Progress Notes (Signed)
PROGRESS NOTE    Carolyn [name]  VZD:[id number] DOB: [birth year] DOA: 03/13/2022 PCP: Pcp, No   Brief Narrative:  The patient is a 67 year old female, past medical history of chronic pancreatitis, history of prior alcoholism, gastroesophageal reflux, prior vitamin D deficiency documented. She has known malabsorption issues and has been on Creon. She presents with cellulitis and sepsis related to a ongoing rash of the hands and feet. It appears that the rash has been going on for several weeks continued to get worse. As started off with a small blister the blister then ruptured the skin started to dry out crack and flake. She is starting to flake and desquamate portions of the hands and feet. She developed increased pain and swelling in the right hand that developed redness that tracks into the forearm. She presents to the ER with an elevated white count and was treated with antibiotics for concern of sepsis.     **Interim History Patient was admitted by the critical care team and then subsequently transferred to the [facility] service on 01/14/2021 after she was stabilized.  She continues have significant pain and GI and palliative care been consulted.  Patient was transfused 3 units of PRBCs while she was hospitalized given her trending hemoglobin.  Care has now been adjusting her pain medications and IV Decadron.  GI is doing further workup and monitoring and CT scan of the abdomen chest and pelvis to evaluate for other causes of weight loss and ruling out malignancy.   See below for CT results.  Patient continues to still be volume overloaded so we will continue IV diuresis but will increase the dose of diuresis from IV 40 every 12 to IV 60 every 12h given that she remains significantly volume overloaded but will may be even consider increasing to 80 twice daily.   She seems to be improving and felt better today with increased fentanyl dose.  Palliative evaluated and feels that she would be a good candidate  for ketamine to reduce her opioid requirements. Calorie Count is on-going and she remains on TPN and will attempt to wean once calorie count results.    Assessment and Plan:  Severe sepsis secondary to desquamative dermatitis with superimposed cellulitis and possible underlying osteomyelitis, improved  -Patient noted to have presented with complicated nonhealing desquamative dermatitis involving hands and feet complicated by severe sepsis due to cellulitis/wound infection. -MRI of bilateral hands and feet done without any definite evidence of osteomyelitis. -Patient initially admitted to the critical care service, concern for probable vitamin/nutritional deficiencies versus paraneoplastic syndromes. -Concern for possible nutritional deficiencies, B2, B6, tryptophan, zinc, thiamine etc. could lead to this. -Patient noted to be prior alcoholic now with a chronic pancreatitis on Creon and likely has pre-existing condition of malabsorption as well as possible gastric surgery unsure as to what the patient has had this on not however per PCCM. -Patient placed empirically on IV vancomycin, IV Rocephin and subsequently transition to doxycycline and Augmentin and just completed a 10-day course of antibiotic treatment 03/22/2022 but then IV Unasyn was resumed given concern for likely Aspiration Pneumonia and will continue for 7 days  -Continue vitamin D3, vitamin B12, zinc, vitamin D3. -Continue vitamin A, selenium. -Will need follow-up with Dermatology (she is followed in [place], [address]) -WBC Trend: Recent Labs  Lab 03/23/22 0347 03/24/22 0152 03/25/22 0400 03/26/22 0323 03/27/22 0355 03/28/22 0305 03/29/22 0255  WBC 13.0* 9.4 11.8* 10.3 11.2* 9.7 9.8  -Will need outpatient follow-up with Podiatry for bone biopsy to  evaluate for osteomyelitis. -Outpatient follow-up with ID which had been set for 03/22/2022. -Patient unable to ambulate seen by PT would likely require SNF  placement. -Patient continues to have ongoing pain, causing tachycardia, continue current regimen and IV Dilaudid as needed. -Continue Neurontin 300 mg 3 times daily, scheduled Tylenol.  -Consulted palliative care for goals of care and symptom management and appreciate palliative involvement.  Patient is DNR and they are recommending medical interventions desired if medically indicated and appropriate. -Given the patient's uncontrolled pain palliative care is increased to hydromorphone to 2 mg every 2 as well as added a fentanyl patch 25 mcg and increased to 50 mcg and added phenobarbital for anxiety nightly and if she tolerates this regular schedule this as needed -Palliative is also added Decadron 8 mg IV daily but is now weaned to 4 mg daily and recommending a long taper over several weeks as well as recommending continuing aggressive diuresis and continue antibiotics and checking T3, T4 as well as cortisol level. -Continue supportive care and she is doing better    Acute Hypoxic respiratory failure likely secondary to acute on chronic diastolic CHF exacerbation +/- aspiration pneumonia -Patient noted to have a bout of nausea and emesis yesterday evening, noted to have increased O2 requirements currently on 3 L nasal cannula and was on room air 24 hours ago. -SpO2: 98 % O2 Flow Rate (L/min): 2 L/min FiO2 (%): 32 % -BNP noted to be significantly elevated at 3564.6 and will repeat in the AM  -IV Albumin had stopped but may consider resuming  -Placed on Lasix 40 mg IV every 12 hours and increased to IV 60 mg but will go to IV 80 mg on 03/29/22, strict I's and O's, daily weights; she is still +3.985 L since admission -Placed empirically on IV Unasyn given likley aspiration  -CT Chest done and showed "Multifocal ground-glass pulmonary infiltrates, new from chest radiograph of 03/15/2022, more prevalent within the lung apices bilaterally, possibly related to atypical infection in the acute setting.  Large right and small left pleural effusions" -ECHO from 01/15/22 showed that the left ventricular ejection fraction was estimated to be 70 to 75% with left ventricular having hyperdynamic function in the left ventricle having no regional wall motion abnormalities.  There is mild left ventricular hypertrophy of the basal septal segment and the left ventricular diastolic parameters are consistent with grade 1 diastolic dysfunction -C/w Dexamethasone 8 mg IV q24h but this was weaned to 4 mg Daily  -Repeat chest x-ray in 48 hours and if significant clinical improvement with diuresis would likely stop IV antibiotics at that time. -DG Chest on 03/28/22 showed "Right larger than left layering pleural effusions with associated bibasilar atelectasis versus infiltrate. Stable cardiomegaly. No significant interval change in the appearance of the lungs." -Continue monitor and trend respiratory status as she is improving    History of chronic alcoholic pancreatitis -Continue Creon.    Hepatic cirrhosis with Ascites Abnormal/Elevated LFTs, mild and mildly worsening  -Being followed in the outpatient setting by [facility]. -BP soft and as such diuretics held spironolactone and Lasix held. -Patient with no hepatic encephalopathy, -CT Abdomen and Pelvis done and showed "Marked diffuse subcutaneous body wall edema. Mild ascites. The constellation of findings can be seen the setting of anasarca. Punctate free intraperitoneal gas within the upper abdomen adjacent to numerous surgical clips involving the distal stomach. A site of bowel perforation is not clearly identified and there is no leakage of oral contrast. Correlation for an exogenous  source of gas, as can be seen with paracentesis, is recommended. Mild hepatic steatosis. Changes in keeping with chronic pancreatitis." -LFT Trend: Recent Labs  Lab 03/18/22 0411 03/22/22 0234 03/25/22 0400 03/26/22 0323 03/27/22 0355 03/28/22 0305 03/29/22 0255  AST 29 30  33 42* 67* 42* 39  ALT 20 17 22 30  49* 36 34   -Outpatient follow-up with GI.   Gastric chronic ulcer with contained perforation -Stable. 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   -Hemoglobin seems to be trickling back down again  -Continue PPI twice daily, Pepcid twice daily.  -Consulted with GI to see if repeat EGD needed, further evaluation and management. -GI recommends continuing PPI twice daily, TPN as well as obtaining another Hemoccult which was negative today. -Patient was able to eat a little bit today and feels that her Decadron hydromorphone is helping her. -GI is low suspicion for an actively bleeding gastric ulcer given that she has no overt bleeding and heme-negative stool -GI is ordering a CT Scan of the Chest/Abd/Pelvis to rule out malignancy or other sources of weight loss and this is was done. -CT Chest/Abd/Pelvis was done and showed "Multifocal ground-glass pulmonary infiltrates, new from chest radiograph of 03/15/2022, more prevalent within the lung apices bilaterally, possibly related to atypical infection in the acute setting. Large right and small left pleural effusions. Marked diffuse subcutaneous body wall edema. Mild ascites. The constellation of findings can be seen the setting of anasarca. Punctate free intraperitoneal gas within the upper abdomen adjacent to numerous  surgical clips involving the distal stomach. A site of bowel perforation is not clearly identified and there is no leakage of oral contrast. Correlation for an exogenous source of gas, as can be seen with paracentesis, is recommended. Mild hepatic steatosis. Changes in keeping with chronic pancreatitis. No definite superimposed acute peripancreatic inflammatory changes. Aortic Atherosclerosis"   Hypothyroidism -TSH noted at 33.958 on 03/15/2022. -Continue Synthroid 50 mcg's daily.   -Will need repeat labs in 4 to 6 weeks in the outpatient setting.     Hypophosphatemia Hypomagnesemia -Mag and Phos Trend: Recent Labs  Lab 03/23/22 0347 03/24/22 0152 03/25/22 0400 03/26/22 0323 03/27/22 0355 03/28/22 0305 03/29/22 0255  MG 2.0 1.6* 2.4 2.5* 2.3 2.2 2.1  PHOS 3.3 4.0 5.0* 4.7* 3.6 3.6 4.2  -Repletion per pharmacy.   -Repeat labs in the AM.     Severe Protein calorie malnutrition -Patient seen by dietitian, PICC line placed and patient started on TPN per pharmacy. Nutrition Status: Nutrition Problem: Severe Malnutrition Etiology: chronic illness (chronic pancreatitis, h/o multiple gastric surgeries including gastric bypass) Signs/Symptoms: severe fat depletion, severe muscle depletion Interventions: Ensure Enlive (each supplement provides 350kcal and 20 grams of protein), MVI, Tube feeding, Prostat -Albumin level noted at< 1.5 -IV albumin every 6 hours x 24 hours.   -Diagnosed to continue TPN for total of 10 days but may extend given that she is improving and obtaining a calorie  count to see   Anemia of chronic disease/folate deficiency -Hemoglobin noted to go as low as 6.5, status post transfusion 1 unit packed red blood cells.   Hemoglobin noted at 7.1 on 03/20/2022.  Status posttransfusion 2 units packed red blood cells hemoglobin currently at 8.2 from 10.1 (03/22/2022 ). 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   -Hemoglobin seems to be trickling back down.   -Hgb/Hct Trend: Recent Labs  Lab 03/23/22 0347 03/24/22 0152 03/25/22 0400 03/26/22 0323 03/27/22 0355 03/28/22 0305 03/29/22 0255  HGB 8.8* 8.2* 8.1* 8.2* 8.6* 8.4* 8.3*  HCT 28.1* 26.6* 26.6* 26.8* 27.5* 27.3* 27.0*  MCV 97.9 100.8* 101.9* 100.0 101.1* 101.9* 100.4*  -GI consulted for further evaluation and management to see if repeat EGD needed as well.   -Follow H&H, transfusion threshold hemoglobin  < 8.    Right upper extremity swelling and pain -Clinical improvement.   -RUE Dopplers negative for DVT. -Keep right upper extremity elevated. -Patient started on diuretics.   NSVT -Patient noted to have 7 beat run of NSVT on 03/19/2022, patient was asymptomatic.   -Electrolytes repleted potassium today currently at 4.4, magnesium at 2. -Hemoglobin noted at 7.1 on 03/20/2022, status post transfusion 2 units packed red blood cells hemoglobin currently at 8.3 and seems to be trending down.  -Repeat labs in the AM.    DVT prophylaxis: enoxaparin (LOVENOX) injection 40 mg Start: 03/26/22 2200 Place and maintain sequential compression device Start: 03/23/22 1714 SCDs Start: 03/14/22 1801    Code Status: DNR Family Communication: No family present at bedside   Disposition Plan:  Level of care: Telemetry Status is: Inpatient Remains inpatient appropriate because: Remains volume overloaded and will need further diuresis and weaning of O2   Consultants:  Palliative care medicine PCCM transfer Infectious diseases Gastroenterology  Procedures:  As delineated as above   Antimicrobials:  Anti-infectives (From admission, onward)    Start     Dose/Rate Route Frequency Ordered Stop   03/23/22 1215  Ampicillin-Sulbactam (UNASYN) 3 g in sodium chloride 0.9 % 100 mL IVPB        3 g 200 mL/hr over 30 Minutes  Intravenous Every 6 hours 03/23/22 1118     03/19/22 2200  amoxicillin-clavulanate (AUGMENTIN) 875-125 MG per tablet 1 tablet        1 tablet Oral Every 12 hours 03/19/22 1115 03/22/22 2231   03/19/22 2200  doxycycline (VIBRA-TABS) tablet 100 mg        100 mg Oral Every 12 hours 03/19/22 1115 03/22/22 2230   03/14/22 2200  vancomycin (VANCOREADY) IVPB 500 mg/100 mL  Status:  Discontinued        500 mg 100 mL/hr over 60 Minutes Intravenous Every 24 hours 03/13/22 2049 03/19/22 1116   03/13/22 1915  vancomycin (VANCOCIN) IVPB 1000 mg/200 mL premix        1,000 mg 200 mL/hr over 60 Minutes Intravenous  Once 03/13/22 1908 03/14/22 0103   03/13/22 1615  cefTRIAXone (ROCEPHIN) 2 g in sodium chloride 0.9 % 100 mL IVPB        2 g 200 mL/hr over 30 Minutes Intravenous Every 24 hours 03/13/22 1603 03/19/22 1959       Subjective: Seen and examined at bedside and she continues to have some pain but that she is doing okay. Swelling is improving further.  Still remains a little short of breath but feels that she is not as rhonchorous.  Denies lightheadedness or dizziness.  No other concerns or complaints at this time.  Objective: Vitals:   03/28/22 2027 03/29/22 0400 03/29/22 0443 03/29/22 1200  BP: 118/74 (!) 144/89  137/79  Pulse: (!) 102   (!) 111  Resp: 17   14  Temp: 98.1 F (36.7 C) 98.3 F (36.8 C)  98 F (36.7 C)  TempSrc: Oral Oral  Oral  SpO2: 98%     Weight:   64.9 kg   Height:        Intake/Output Summary (Last 24 hours) at 03/29/2022 1647 Last data filed at 03/29/2022 1258 Gross per 24 hour  Intake 3131.42 ml  Output 4000 ml  Net -868.58 ml   Filed Weights   03/27/22 0431 03/28/22 0500 03/29/22 0443  Weight: 66.7 kg 66.4 kg 64.9 kg   Examination: Physical Exam:  Constitutional: Thin cachectic and chronically ill-appearing Caucasian female who is in no real acute distress appears will be more comfortable today Respiratory: Diminished to auscultation bilaterally with  coarse breath sounds and some rhonchi and slight crackles., no wheezing, rales. Normal respiratory effort and patient is not tachypenic. No accessory muscle use.  Wearing supplemental oxygen via nasal cannula  Cardiovascular: Slightly tachycardic rate but regular rhythm, no murmurs / rubs / gallops. S1 and S2 auscultated.  Lower extremity edema and has 1-2+ upper extremity edema worse on right compared to left Abdomen: Soft, non-tender, non-distended.  Bowel sounds positive.  GU: Deferred. Musculoskeletal: No clubbing / cyanosis of digits/nails. No joint deformity upper and lower extremities.  Skin: Right hand and lower extremity bilateral feet are wrapped Neurologic: CN 2-12 grossly intact with no focal deficits. Romberg sign and cerebellar reflexes not assessed.  Psychiatric: Normal judgment and insight. Alert and oriented x 3. Normal mood and appropriate affect.   Data Reviewed: I have personally reviewed following labs and imaging studies  CBC: Recent Labs  Lab 03/25/22 0400 03/26/22 0323 03/27/22 0355 03/28/22 0305 03/29/22 0255  WBC 11.8* 10.3 11.2* 9.7 9.8  NEUTROABS 10.1* 8.7* 9.5* 7.7 8.1*  HGB 8.1* 8.2* 8.6* 8.4* 8.3*  HCT 26.6* 26.8* 27.5* 27.3* 27.0*  MCV 101.9* 100.0 101.1* 101.9* 100.4*  PLT 157 190 215 240 220   Basic Metabolic Panel: Recent Labs  Lab 03/25/22 0400 03/26/22 0323 03/27/22 0355 03/28/22 0305 03/29/22 0255  NA 139 141 142 142 142  K 4.2 4.4 3.8 3.5 3.5  CL 102 103 101 99 98  CO2 28 30 30  34* 34*  GLUCOSE 230* 140* 127* 120* 136*  BUN 54* 62* 63* 66* 64*  CREATININE 0.77 0.78 0.79 0.75 0.72  CALCIUM 8.7* 8.7* 8.6* 8.6* 8.8*  MG 2.4 2.5* 2.3 2.2 2.1  PHOS 5.0* 4.7* 3.6 3.6 4.2   GFR: Estimated Creatinine Clearance: 71.7 mL/min (by C-G formula based on SCr of 0.72 mg/dL). Liver Function Tests: Recent Labs  Lab 03/25/22 0400 03/26/22 0323 03/27/22 0355 03/28/22 0305 03/29/22 0255  AST 33 42* 67* 42* 39  ALT 22 30 49* 36 34  ALKPHOS 86  86 104 76 72  BILITOT 0.3 0.4 0.5 0.7 0.8  PROT 6.2* 6.1* 6.2* 6.0* 5.9*  ALBUMIN 3.3* 3.1* 3.1* 3.3* 3.5   No results for input(s): "LIPASE", "AMYLASE" in the last 168 hours. No results for input(s): "AMMONIA" in the last 168 hours. Coagulation Profile: Recent Labs  Lab 03/23/22 1451  INR 1.3*   Cardiac Enzymes: No results  for input(s): "CKTOTAL", "CKMB", "CKMBINDEX", "TROPONINI" in the last 168 hours. BNP (last 3 results) No results for input(s): "PROBNP" in the last 8760 hours. HbA1C: No results for input(s): "HGBA1C" in the last 72 hours. CBG: Recent Labs  Lab 03/28/22 1219 03/28/22 1650 03/29/22 0002 03/29/22 0607 03/29/22 1124  GLUCAP 193* 107* 179* 146* 192*   Lipid Profile: Recent Labs    03/29/22 0255  TRIG 59   Thyroid Function Tests: No results for input(s): "TSH", "T4TOTAL", "FREET4", "T3FREE", "THYROIDAB" in the last 72 hours. Anemia Panel: No results for input(s): "VITAMINB12", "FOLATE", "FERRITIN", "TIBC", "IRON", "RETICCTPCT" in the last 72 hours. Sepsis Labs: No results for input(s): "PROCALCITON", "LATICACIDVEN" in the last 168 hours.  No results found for this or any previous visit (from the past 240 hour(s)).   Radiology Studies: DG CHEST PORT 1 VIEW  Result Date: 03/28/2022 CLINICAL DATA:  Short of breath, dyspnea EXAM: PORTABLE CHEST 1 VIEW COMPARISON:  Prior chest x-ray 03/26/2022 FINDINGS: Stable cardiomegaly. Moderately large layering right pleural effusion. Small left pleural effusion. Associated bibasilar airspace opacities. No pneumothorax. No acute osseous abnormality. IMPRESSION: 1. Right larger than left layering pleural effusions with associated bibasilar atelectasis versus infiltrate. 2. Stable cardiomegaly. 3. No significant interval change in the appearance of the lungs. Electronically Signed   By: Jacqulynn Cadet M.D.   On: 03/28/2022 07:38    Scheduled Meds:  (feeding supplement) PROSource Plus  30 mL Oral BID BM    acetaminophen  1,000 mg Oral TID   Chlorhexidine Gluconate Cloth  6 each Topical Daily   cholecalciferol  1,000 Units Oral Daily   dexamethasone (DECADRON) injection  4 mg Intravenous Q24H   enoxaparin (LOVENOX) injection  40 mg Subcutaneous Q24H   famotidine  10 mg Oral BID   feeding supplement  237 mL Oral TID BM   fentaNYL  1 patch Transdermal Q72H   furosemide  80 mg Intravenous Q12H   gabapentin  300 mg Oral TID   insulin aspart  0-20 Units Subcutaneous Q6H   ipratropium  0.5 mg Nebulization TID   levalbuterol  0.63 mg Nebulization TID   levothyroxine  50 mcg Oral Q0600   liothyronine  25 mcg Oral Daily   lipase/protease/amylase  24,000 Units Oral TID WC   metoprolol tartrate  12.5 mg Oral BID   mupirocin cream   Topical Once   pantoprazole  40 mg Oral BID   PHENObarbital  32.5 mg Intravenous QHS   pyridOXINE  100 mg Oral Daily   silver sulfADIAZINE   Topical Daily   sodium chloride flush  10-40 mL Intracatheter Q12H   vitamin A  50,000 Units Oral Daily   vitamin B-12  100 mcg Oral Daily   vitamin E  400 Units Oral Daily   zinc sulfate  220 mg Oral Daily   Continuous Infusions:  albumin human 12.5 g (03/29/22 1608)   ampicillin-sulbactam (UNASYN) IV 3 g (03/29/22 1453)   TPN ADULT (ION) 80 mL/hr at 03/29/22 0245   TPN ADULT (ION)      LOS: 15 days   Raiford Noble, DO [facility] Available via Epic secure chat 7am-7pm After these hours, please refer to coverage provider listed on [email or web address] 03/29/2022, 4:47 PM

## 2022-03-29 NOTE — Progress Notes (Signed)
Calorie Count Note  48 hour calorie count ordered.  Diet: Regular Supplements: Ensure TID, Prosource Plus BID TPN: meeting 100% of estimated needs  Breakfast: 25% egg and swiss cheese croissant + 2 diet gingerales = 121 kcals, 5g protein Lunch: No lunch ordered Dinner: none documented Supplements: 3 Ensure Plus, 2 Prosource Plus = 1250 kcals, 78g protein  Total intake: 1371 kcal (72% of minimum estimated needs)  83 protein (87% of minimum estimated needs)  Nutrition Dx: Severe Malnutrition related to chronic illness (chronic pancreatitis, h/o multiple gastric surgeries including gastric bypass) as evidenced by severe fat depletion, severe muscle depletion   Goal: Patient will meet greater than or equal to 90% of their needs   Intervention:  - Calorie count to continue through 1/17.   - Discussed with RN.   - Please document % intake of all food, drinks, and supplements patient consumes. - Continue Ensure TID and Prosource Plus TID.  - Plan to continue goal TPN at this time. TPN management per pharmacy.    Samson Frederic RD, LDN For contact information, refer to [facility].

## 2022-03-29 NOTE — Care Management Important Message (Signed)
Important Message  Patient Details IM Letter given. Name: Carolyn [name] MRN: [id number] Date of Birth: 01-Aug-1955   Medicare Important Message Given:  Yes     Kerin Salen 03/29/2022, 12:49 PM

## 2022-03-29 NOTE — Progress Notes (Signed)
PHARMACY - TOTAL PARENTERAL NUTRITION CONSULT NOTE   Indication:  chronic severe malnutrition with desquamating rash  Patient Measurements: Height: 5\' 8"  (172.7 cm) Weight: 64.9 kg (143 lb 1.3 oz) IBW/kg (Calculated) : 63.9 TPN AdjBW (KG): 46 Body mass index is 21.76 kg/m.  Assessment: 67 year old female with severe malnutrition related to chronic illness (including chronic pancreatitis, h/o multiple gastric surgeries including gastric bypass, GERD, Vit D def, know malabsorption issues ). Patient with desquamating rash likely related to significant nutritional/vitamin deficiencies. Pharmacy consulted to dose TPN to accelerate improvement in nutritional deficiencies. Initially planning for 7-10 days of TPN.  Glucose / Insulin: no hx diabetes. CBG goal <150. - insulin added to TPN 1/10; CBGs trending down with increased amount of insulin in TPN 1/12,  removed 1/13. CBGs currently 107-193. - sSSI: 14 units/24 hr on resistant SSI q6h - Dexamethasone 8 mg IV q24h started 1/9, decreased 4mg  q24h on 1/13  Electrolytes: All WNL including CorrCa (no Phos or Ca in TPN). Bicarb slightly elevated Renal: SCr WNL, BUN elevated and continues to trend up Hepatic: AST/ALT slightly elevated but improved, albumin up to 3.5 - TG 59 stable GI/Nutrition meds: Vit D, Pepcid 10 BID, Creon TIDWC, IV PPI/12 hrs, Vit B6, Vit A, Vit B12, Vit E, and Zinc  Intake / Output:  - UOP: 5250 mL, LBM 1/14 - No mIVF - Lasix 60mg  IV q12h + Albumin q8h, significant volume overload noted (weight 143lbs down 3 lbs) - po: Regular diet 324ml on flowsheet (Prosource 59ml BID, Ensure TID )  GI Imaging: NA GI Surgeries / Procedures: NA  Central access: 1/3 TPN start date: 1/3  Nutritional Goals: Goal TPN rate is 80 mL/hr (provides 100 g of protein and 1954 kcals per day) Ensure provides 350kcal and 16g protein per bottle (receiving TID) Prosource provides 15g protein/100cal per 93ml (receiving BID)  RD  Assessment: Estimated Needs Total Energy Estimated Needs: 1900-2100 Total Protein Estimated Needs: 95-105g Total Fluid Estimated Needs: 2L/day  Current Nutrition: regular diet ordered, but no intake charted in past 24hr -MD reports intake improving -Enteral supplements:  Ensure Plus TID (all charted 1/14), Prosource BID (charted)  Plan:  Per discussion with [facility] on 1/9, planning for a 10 day duration of TPN. Today is day #12. Per discussion with TRH today 1/14, continue with TPN today and plan for calorie count to assess intake.Calorie Count start 1/14 with RD follow-up 1/15 and 1/16 with results   At 1800: Continue TPN at goal rate 80 mL/hr (3204 kcal, 178g protein with TPN + supplements). Discussed with dietician this AM. Electrolytes in TPN:  Na 150 mEq/L,  K 50 mEq/L  Ca 0 mEq/L,  Mg 3 mEq/L,  Phos 0 mmol/L. Max Cl Add standard MVI and trace elements to TPN (provides 60 mcg selenium, adequate per discussion with RD) Add thiamine 254 mg and folic acid 1 mg to TPN Con't 10 units of insulin to TPN. Continue CBG checks q6h + resistant SSI Monitor TPN labs on Mon/Thurs.   Kimbely Whiteaker S. Alford Highland, PharmD, BCPS Clinical Staff Pharmacist [email or web address] 03/29/22 7:10 AM

## 2022-03-30 ENCOUNTER — Inpatient Hospital Stay (HOSPITAL_COMMUNITY): Payer: Medicare PPO

## 2022-03-30 DIAGNOSIS — R634 Abnormal weight loss: Secondary | ICD-10-CM | POA: Diagnosis not present

## 2022-03-30 DIAGNOSIS — K255 Chronic or unspecified gastric ulcer with perforation: Secondary | ICD-10-CM | POA: Diagnosis not present

## 2022-03-30 DIAGNOSIS — D638 Anemia in other chronic diseases classified elsewhere: Secondary | ICD-10-CM | POA: Diagnosis not present

## 2022-03-30 DIAGNOSIS — K860 Alcohol-induced chronic pancreatitis: Secondary | ICD-10-CM | POA: Diagnosis not present

## 2022-03-30 LAB — COMPREHENSIVE METABOLIC PANEL
ALT: 41 U/L (ref 0–44)
AST: 45 U/L — ABNORMAL HIGH (ref 15–41)
Albumin: 3.8 g/dL (ref 3.5–5.0)
Alkaline Phosphatase: 107 U/L (ref 38–126)
Anion gap: 13 (ref 5–15)
BUN: 52 mg/dL — ABNORMAL HIGH (ref 8–23)
CO2: 32 mmol/L (ref 22–32)
Calcium: 8.7 mg/dL — ABNORMAL LOW (ref 8.9–10.3)
Chloride: 94 mmol/L — ABNORMAL LOW (ref 98–111)
Creatinine, Ser: 0.67 mg/dL (ref 0.44–1.00)
GFR, Estimated: >60 mL/min (ref >60)
Glucose, Bld: 141 mg/dL — ABNORMAL HIGH (ref 70–99)
Potassium: 3.3 mmol/L — ABNORMAL LOW (ref 3.5–5.1)
Sodium: 139 mmol/L (ref 135–145)
Total Bilirubin: 0.6 mg/dL (ref 0.3–1.2)
Total Protein: 6.5 g/dL (ref 6.5–8.1)

## 2022-03-30 LAB — CBC WITH DIFFERENTIAL/PLATELET
Abs Immature Granulocytes: 0.16 10*3/uL — ABNORMAL HIGH (ref 0.00–0.07)
Basophils Absolute: 0 10*3/uL (ref 0.0–0.1)
Basophils Relative: 0 %
Eosinophils Absolute: 0.1 10*3/uL (ref 0.0–0.5)
Eosinophils Relative: 1 %
HCT: 26.7 % — ABNORMAL LOW (ref 36.0–46.0)
Hemoglobin: 8.2 g/dL — ABNORMAL LOW (ref 12.0–15.0)
Immature Granulocytes: 1 %
Lymphocytes Relative: 8 %
Lymphs Abs: 0.9 10*3/uL (ref 0.7–4.0)
MCH: 31.3 pg (ref 26.0–34.0)
MCHC: 30.7 g/dL (ref 30.0–36.0)
MCV: 101.9 fL — ABNORMAL HIGH (ref 80.0–100.0)
Monocytes Absolute: 0.5 10*3/uL (ref 0.1–1.0)
Monocytes Relative: 5 %
Neutro Abs: 9.4 10*3/uL — ABNORMAL HIGH (ref 1.7–7.7)
Neutrophils Relative %: 85 %
Platelets: 229 10*3/uL (ref 150–400)
RBC: 2.62 MIL/uL — ABNORMAL LOW (ref 3.87–5.11)
RDW: 16.5 % — ABNORMAL HIGH (ref 11.5–15.5)
WBC: 11.1 10*3/uL — ABNORMAL HIGH (ref 4.0–10.5)
nRBC: 0 % (ref 0.0–0.2)

## 2022-03-30 LAB — GLUCOSE, CAPILLARY
Glucose-Capillary: 115 mg/dL — ABNORMAL HIGH (ref 70–99)
Glucose-Capillary: 195 mg/dL — ABNORMAL HIGH (ref 70–99)
Glucose-Capillary: 153 mg/dL — ABNORMAL HIGH (ref 70–99)
Glucose-Capillary: 234 mg/dL — ABNORMAL HIGH (ref 70–99)

## 2022-03-30 LAB — PHOSPHORUS: Phosphorus: 3.1 mg/dL (ref 2.5–4.6)

## 2022-03-30 LAB — MAGNESIUM: Magnesium: 1.8 mg/dL (ref 1.7–2.4)

## 2022-03-30 MED ORDER — POTASSIUM CHLORIDE 10 MEQ/50ML IV SOLN
10.0000 meq | INTRAVENOUS | Status: AC
  Administered 2022-03-30 (×4): 10 meq via INTRAVENOUS
  Filled 2022-03-30 (×4): qty 50

## 2022-03-30 MED ORDER — DEXTROSE 10 % IV SOLN: INTRAVENOUS | Status: DC

## 2022-03-30 MED ORDER — TRAVASOL 10 % IV SOLN
INTRAVENOUS | Status: AC
  Filled 2022-03-30: qty 998.4

## 2022-03-30 MED ORDER — FENTANYL 75 MCG/HR TD PT72
1.0000 | MEDICATED_PATCH | TRANSDERMAL | Status: DC
  Administered 2022-03-30: 1 via TRANSDERMAL
  Filled 2022-03-30: qty 1

## 2022-03-30 NOTE — Progress Notes (Signed)
At [place] IV team consult for assessment of PICC line for TPN connection coming loose and in the bed.RN had stopped TPN.  0830 IV team at bedside - assessed PICC Lumen # 2 - new cap placed after CHG scrub of hub. RN advised get a hold of MD for possible D10 if needed & pharmacy to notify that TPN was compromised and had to be taken down down.

## 2022-03-30 NOTE — Plan of Care (Signed)
  Problem: Education: Goal: Knowledge of General Education information will improve Description: Including pain rating scale, medication(s)/side effects and non-pharmacologic comfort measures Outcome: Progressing   Problem: Clinical Measurements: Goal: Diagnostic test results will improve Outcome: Progressing   Problem: Safety: Goal: Ability to remain free from injury will improve Outcome: Progressing   

## 2022-03-30 NOTE — Progress Notes (Signed)
Pt called saying her arm was bleeding. Upon assessment the pts TPN was disconnected and flowing into the bed. She was bleeding from her PICC line. PICC line and cap were cleaned and cap was reconnected. TPN was stopped. IV team called. IV team came and recleaned and changed the cap. MD and pharmacy notified. D10 ordered and hung.

## 2022-03-30 NOTE — Progress Notes (Addendum)
Calorie Count Note  48 hour calorie count ordered.   Diet: Regular Supplements: Ensure TID, Prosource Plus BID TPN: meeting 100% of estimated needs  Calorie count day 1 (1/14): Breakfast: 25% egg and swiss cheese croissant + 2 diet gingerales = 121 kcals, 5g protein Lunch: No lunch ordered Dinner: none documented Supplements: 3 Ensure Plus, 2 Prosource Plus = 1250 kcals, 78g protein Total intake: 1371 kcal (72% of minimum estimated needs)  83 protein (87% of minimum estimated needs)   Calorie count day 2 (1/15): Breakfast: 25% belgian waffles, 50% bacon, 100% fruit cup, 100% orange juice, 1.5 cups apple juice = 672 kcal, 8g protein Lunch: 25% [place] deli sandwich, 100% diet ginger ale, 2 cups apple juice = 180 kcal, 6g protein Dinner: nothing documented for calorie count or in EMR Supplements: 3 Ensure, 2 Prosource Plus = 1250 kcals, 78g protein  Total intake: 2102 kcal (100% of maximum estimated needs)  92 protein (97% of minimum estimated needs)   Nutrition Dx: Severe Malnutrition related to chronic illness (chronic pancreatitis, h/o multiple gastric surgeries including gastric bypass) as evidenced by severe fat depletion, severe muscle depletion    Goal: Patient will meet greater than or equal to 90% of their n  Intervention:  - Calorie count to continue through the end of today (1/17).              - Discussed with RN.              - Please document % intake of all food, drinks, and supplements patient consumes . - Continue Ensure TID and Prosource Plus TID.   - Plan to continue goal TPN at this time. TPN management per pharmacy.   - TPN currently off as line connection came loose and was running into bed. Plan to restart TPN tonight. Patient receiving D10W at 10mL/hr.  - Discussed with RN, MD, and pharmacist that if patient does well again today can consider starting to wean.     Samson Frederic RD, LDN For contact information, refer to [facility].

## 2022-03-30 NOTE — Progress Notes (Signed)
PROGRESS NOTE    Carolyn [name]  [id number] DOB: 1955-07-18 DOA: 03/13/2022 PCP: Pcp, No   Brief Narrative:  The patient is a 67 year old female, past medical history of chronic pancreatitis, history of prior alcoholism, gastroesophageal reflux, prior vitamin D deficiency documented. She has known malabsorption issues and has been on Creon. She presents with cellulitis and sepsis related to a ongoing rash of the hands and feet. It appears that the rash has been going on for several weeks continued to get worse. As started off with a small blister the blister then ruptured the skin started to dry out crack and flake. She is starting to flake and desquamate portions of the hands and feet. She developed increased pain and swelling in the right hand that developed redness that tracks into the forearm. She presents to the ER with an elevated white count and was treated with antibiotics for concern of sepsis.     **Interim History Patient was admitted by the critical care team and then subsequently transferred to the [facility] service on 01/14/2021 after she was stabilized.  She continues have significant pain and GI and palliative care been consulted.  Patient was transfused 3 units of PRBCs while she was hospitalized given her trending hemoglobin.  Care has now been adjusting her pain medications and IV Decadron.  GI is doing further workup and monitoring and CT scan of the abdomen chest and pelvis to evaluate for other causes of weight loss and ruling out malignancy.   See below for CT results.  Patient continues to still be volume overloaded so we will continue IV diuresis but will increase the dose of diuresis from IV 40 every 12 to IV 60 every 12h given that she remains significantly volume overloaded but will may be even consider increasing to 80 twice daily.   She seems to be improving and felt better today with increased fentanyl dose.  Palliative evaluated and feels that she would be a good candidate  for ketamine to reduce her opioid requirements. Calorie Count is on-going and she remains on TPN and will attempt to wean once calorie count results.  Calorie count continue today but she appears that she is adequately meeting her oral nutritional needs so if she passes today's calorie count will start weaning tomorrow.  Assessment and Plan:  Severe sepsis secondary to desquamative dermatitis with superimposed cellulitis and possible underlying osteomyelitis, improved  -Patient noted to have presented with complicated nonhealing desquamative dermatitis involving hands and feet complicated by severe sepsis due to cellulitis/wound infection. -MRI of bilateral hands and feet done without any definite evidence of osteomyelitis. -Patient initially admitted to the critical care service, concern for probable vitamin/nutritional deficiencies versus paraneoplastic syndromes. -Concern for possible nutritional deficiencies, B2, B6, tryptophan, zinc, thiamine etc. could lead to this. -Patient noted to be prior alcoholic now with a chronic pancreatitis on Creon and likely has pre-existing condition of malabsorption as well as possible gastric surgery unsure as to what the patient has had this on not however per PCCM. -Patient placed empirically on IV vancomycin, IV Rocephin and subsequently transition to doxycycline and Augmentin and just completed a 10-day course of antibiotic treatment 03/22/2022 but then IV Unasyn was resumed given concern for likely Aspiration Pneumonia and will continue for a total of 7 days  -Continue vitamin D3, vitamin B12, zinc, vitamin D3. -Continue vitamin A, selenium. -Will need follow-up with Dermatology (she is followed in [place]) -WBC Trend: Recent Labs  Lab 03/24/22 0152 03/25/22 0400 03/26/22  7616 03/27/22 0355 03/28/22 0305 03/29/22 0255 03/30/22 0320  WBC 9.4 11.8* 10.3 11.2* 9.7 9.8 11.1*  -Will need outpatient follow-up with Podiatry for bone  biopsy to evaluate for osteomyelitis. -Outpatient follow-up with ID which had been set for 03/22/2022. -Patient unable to ambulate seen by PT would likely require SNF placement. -Patient continues to have ongoing pain, causing tachycardia, continue current regimen and IV Dilaudid as needed. -Continue Neurontin 300 mg 3 times daily, scheduled Tylenol.  -Consulted palliative care for goals of care and symptom management and appreciate palliative involvement.  Patient is DNR and they are recommending medical interventions desired if medically indicated and appropriate. -Given the patient's uncontrolled pain palliative care is increased to hydromorphone to 2 mg every 2 as well as added a fentanyl patch 25 mcg and increased to 50 mcg and added phenobarbital for anxiety nightly and if she tolerates this regular schedule this as needed -Palliative is also added Decadron 8 mg IV daily but is now weaned to 4 mg daily and recommending a long taper over several weeks as well as recommending continuing aggressive diuresis and continue antibiotics and checking T3, T4 as well as cortisol level. -Continue supportive care and she is doing better    Acute Hypoxic respiratory failure likely secondary to acute on chronic diastolic CHF exacerbation +/- aspiration pneumonia -Patient noted to have a bout of nausea and emesis yesterday evening, noted to have increased O2 requirements currently on 3 L nasal cannula and was on room air 24 hours ago. -SpO2: 98 % O2 Flow Rate (L/min): 2 L/min FiO2 (%): 28 % -BNP noted to be significantly elevated at 3564.6 and will repeat in the AM  -IV Albumin had stopped but may consider resuming  -Placed on Lasix 40 mg IV every 12 hours and increased to IV 60 mg but will go to IV 80 mg on 03/29/22, strict I's and O's, daily weights; she is still +3.985 L since admission -Placed empirically on IV Unasyn given likley aspiration  -CT Chest done and showed "Multifocal ground-glass pulmonary  infiltrates, new from chest radiograph of 03/15/2022, more prevalent within the lung apices bilaterally, possibly related to atypical infection in the acute setting. Large right and small left pleural effusions" -ECHO from 01/15/22 showed that the left ventricular ejection fraction was estimated to be 70 to 75% with left ventricular having hyperdynamic function in the left ventricle having no regional wall motion abnormalities.  There is mild left ventricular hypertrophy of the basal septal segment and the left ventricular diastolic parameters are consistent with grade 1 diastolic dysfunction -Dexamethasone weaned to IV 4 mg Daily  -Repeat chest x-ray in 48 hours and if significant clinical improvement with diuresis would likely stop IV antibiotics at that time. -DG Chest on 03/30/22 showed "Persistent bilateral pleural effusions, right greater than left.Suspect mild pulmonary edema." -Continue monitor and trend respiratory status as she is improving    History of chronic alcoholic pancreatitis -Continue Creon 24,000 units TIDwm    Hepatic cirrhosis with Ascites Abnormal/Elevated LFTs, mild and mildly worsening  -Being followed in the outpatient setting by [facility]. -BP soft and as such diuretics held spironolactone and Lasix held. -Patient with no hepatic encephalopathy, -CT Abdomen and Pelvis done and showed "Marked diffuse subcutaneous body wall edema. Mild ascites. The constellation of findings can be seen the setting of anasarca. Punctate free intraperitoneal gas within the upper abdomen adjacent to numerous surgical clips involving the distal stomach. A site of bowel perforation is not clearly identified and  there is no leakage of oral contrast. Correlation for an exogenous source of gas, as can be seen with paracentesis, is recommended. Mild hepatic steatosis. Changes in keeping with chronic pancreatitis." -LFT Trend: Recent Labs  Lab 03/22/22 0234 03/25/22 0400 03/26/22 0323  03/27/22 0355 03/28/22 0305 03/29/22 0255 03/30/22 0320  AST 30 33 42* 67* 42* 39 45*  ALT 17 22 30  49* 36 34 41  -Outpatient follow-up with GI.   Gastric chronic ulcer with contained perforation -Stable. 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   -Hemoglobin seems to be trickling back down again  -Continue PPI twice daily, Pepcid twice daily.  -Consulted with GI to see if repeat EGD needed, further evaluation and management. -GI recommends continuing PPI twice daily, TPN as well as obtaining another Hemoccult which was negative today. -Patient was able to eat a little bit today and feels that her Decadron hydromorphone is helping her. -GI is low suspicion for an actively bleeding gastric ulcer given that she has no overt bleeding and heme-negative stool -GI is ordering a CT Scan of the Chest/Abd/Pelvis to rule out malignancy or other sources of weight loss and this is was done. -CT Chest/Abd/Pelvis was done and showed "Multifocal ground-glass pulmonary infiltrates, new from chest radiograph of 03/15/2022, more prevalent within the lung apices bilaterally, possibly related to atypical infection in the acute setting. Large right and small left pleural effusions. Marked diffuse subcutaneous body wall edema. Mild ascites. The constellation of findings can be seen the setting of anasarca. Punctate  free intraperitoneal gas within the upper abdomen adjacent to numerous surgical clips involving the distal stomach. A site of bowel perforation is not clearly identified and there is no leakage of oral contrast. Correlation for an exogenous source of gas, as can be seen with paracentesis, is recommended. Mild hepatic steatosis. Changes in keeping with chronic pancreatitis. No definite superimposed acute peripancreatic inflammatory changes. Aortic Atherosclerosis"   Hypothyroidism -TSH noted at 33.958 on 03/15/2022. -Continue Synthroid 50 mcg's daily.   -Will need repeat labs in 4 to 6 weeks in the outpatient setting.     Hypophosphatemia Hypomagnesemia Hypokalemia -Mag and Phos Trend: Recent Labs  Lab 03/24/22 0152 03/25/22 0400 03/26/22 0323 03/27/22 0355 03/28/22 0305 03/29/22 0255 03/30/22 0320  K 4.1 4.2 4.4 3.8 3.5 3.5 3.3*  MG 1.6* 2.4 2.5* 2.3 2.2 2.1 1.8  PHOS 4.0 5.0* 4.7* 3.6 3.6 4.2 3.1  -Repletion per pharmacy via TPN -Repeat labs in the AM.     Severe Protein calorie malnutrition -Patient seen by dietitian, PICC line placed and patient started on TPN per pharmacy. Nutrition Status: Nutrition Problem: Severe Malnutrition Etiology: chronic illness (chronic pancreatitis, h/o multiple gastric surgeries including gastric bypass) Signs/Symptoms: severe fat depletion, severe muscle depletion Interventions: Ensure Enlive (each supplement provides 350kcal and 20 grams of protein), MVI, Tube feeding, Prostat -Albumin level noted at< 1.5 -IV albumin every 6 hours x 24 hours.   -Diagnosed  to continue TPN for total of 10 days but may extend given that she is improving and obtaining a calorie count to see   Anemia of chronic disease/folate deficiency -Hemoglobin noted to go as low as 6.5, status post transfusion 1 unit packed red blood cells.   Hemoglobin noted at 7.1 on 03/20/2022.  Status posttransfusion 2 units packed red blood cells hemoglobin currently at 8.2 from 10.1  (03/22/2022 ). 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   -Hemoglobin seems to be trickling back down.   -Hgb/Hct Trend: Recent Labs  Lab 03/24/22 0152 03/25/22 0400 03/26/22 0323 03/27/22 0355 03/28/22 0305 03/29/22 0255 03/30/22 0320  HGB 8.2* 8.1* 8.2* 8.6* 8.4* 8.3* 8.2*  HCT 26.6* 26.6* 26.8* 27.5* 27.3* 27.0* 26.7*  MCV 100.8* 101.9* 100.0 101.1* 101.9* 100.4* 101.9*  -GI consulted for further evaluation and management to see if repeat EGD needed as well.   -Follow H&H, transfusion threshold hemoglobin  < 8.    Right upper extremity swelling and pain -Clinical improvement.   -RUE Dopplers negative for DVT. -Keep right upper extremity elevated. -Patient started on diuretics and still very swollen but improving    NSVT -Patient noted to have 7 beat run of NSVT on 03/19/2022, patient was asymptomatic.   -Electrolytes repleted potassium today currently at 4.4, magnesium at 2. -Hemoglobin noted at 7.1 on 03/20/2022, status post transfusion 2 units packed red blood cells hemoglobin currently at 8.3 and seems to be trending down.  -Repeat labs in the AM.     DVT prophylaxis: enoxaparin (LOVENOX) injection 40 mg Start: 03/26/22 2200 Place and maintain sequential compression device Start: 03/23/22 1714 SCDs Start: 03/14/22 1801    Code Status: DNR Family Communication: No family present at bedside   Disposition Plan:  Level of care: Telemetry Status is: Inpatient Remains inpatient appropriate because: She remains volume overloaded and will need further diuresis and weaning off of supplemental oxygen   Consultants:  Palliative care medicine PCCM transfer Infectious diseases Gastroenterology  Procedures:  As delineated as above  Antimicrobials:  Anti-infectives (From admission, onward)     Start     Dose/Rate Route Frequency Ordered Stop   03/23/22 1215  Ampicillin-Sulbactam (UNASYN) 3 g in sodium chloride 0.9 % 100 mL IVPB        3 g 200 mL/hr over 30 Minutes Intravenous Every 6 hours 03/23/22 1118     03/19/22 2200  amoxicillin-clavulanate (AUGMENTIN) 875-125 MG per tablet 1 tablet        1 tablet Oral Every 12 hours 03/19/22 1115 03/22/22 2231   03/19/22 2200  doxycycline (VIBRA-TABS) tablet 100 mg        100 mg Oral Every 12 hours 03/19/22 1115 03/22/22 2230   03/14/22 2200  vancomycin (VANCOREADY) IVPB 500 mg/100 mL  Status:  Discontinued        500 mg 100 mL/hr over 60 Minutes Intravenous Every 24 hours 03/13/22 2049 03/19/22 1116   03/13/22 1915  vancomycin (VANCOCIN) IVPB 1000 mg/200 mL premix        1,000 mg 200 mL/hr over 60 Minutes Intravenous  Once 03/13/22 1908 03/14/22 0103   03/13/22 1615  cefTRIAXone (ROCEPHIN) 2 g in sodium chloride 0.9 % 100 mL IVPB        2 g 200 mL/hr over 30 Minutes Intravenous Every 24 hours 03/13/22 1603 03/19/22  1959       Subjective: Seen and examined at bedside and thinks she is doing little bit better.  Still very volume overloaded.  States that her pain is doing okay.  No lightheadedness or dizziness.  States that she had issues with her PICC line and it started bleeding last night but is now fixed.  No other concerns or complaints at this time.  Objective: Vitals:   03/30/22 0600 03/30/22 0923 03/30/22 1200 03/30/22 1352  BP: 131/84  117/78   Pulse: 91  (!) 107   Resp: 18  16   Temp: 98 F (36.7 C)     TempSrc: Oral  Oral   SpO2: 100% 98% 91% 98%  Weight:      Height:        Intake/Output Summary (Last 24 hours) at 03/30/2022 1714 Last data filed at 03/30/2022 1408 Gross per 24 hour  Intake 1262.87 ml  Output 2900 ml  Net -1637.13 ml   Filed Weights   03/28/22 0500 03/29/22 0443 03/30/22 0500  Weight: 66.4 kg 64.9 kg 64.8 kg   Examination: Physical Exam:  Constitutional: Thin cachectic and chronically  ill-appearing Caucasian female in NAD appears calm  Respiratory: Diminished to auscultation bilaterally with coarse breath sounds and has some rhonchi and crackles, no wheezing, rales. Normal respiratory effort and patient is not tachypenic. No accessory muscle use. Unlabored breathing  but wearing supplemental O2 via [place]  Cardiovascular: RRR, no murmurs / rubs / gallops. S1 and S2 auscultated.  Has 1+ LE extremity edema and 1-2+ in Upper Extremity Abdomen: Soft, non-tender, non-distended. Bowel sounds positive.  GU: Deferred. Musculoskeletal: No clubbing / cyanosis of digits/nails. No joint deformity upper and lower extremities. Skin: No rashes, lesions, ulcers on a limited skin evaluation. No induration; Warm and dry.  Neurologic: CN 2-12 grossly intact with no focal deficits. Romberg sign and cerebellar reflexes not assessed.  Psychiatric: Normal judgment and insight. Alert and oriented x 3. Normal mood and appropriate affect.   Data Reviewed: I have personally reviewed following labs and imaging studies  CBC: Recent Labs  Lab 03/26/22 0323 03/27/22 0355 03/28/22 0305 03/29/22 0255 03/30/22 0320  WBC 10.3 11.2* 9.7 9.8 11.1*  NEUTROABS 8.7* 9.5* 7.7 8.1* 9.4*  HGB 8.2* 8.6* 8.4* 8.3* 8.2*  HCT 26.8* 27.5* 27.3* 27.0* 26.7*  MCV 100.0 101.1* 101.9* 100.4* 101.9*  PLT 190 215 240 220 [id number]   Basic Metabolic Panel: Recent Labs  Lab 03/26/22 0323 03/27/22 0355 03/28/22 0305 03/29/22 0255 03/30/22 0320  NA 141 142 142 142 139  K 4.4 3.8 3.5 3.5 3.3*  CL 103 101 99 98 94*  CO2 30 30 34* 34* 32  GLUCOSE 140* 127* 120* 136* 141*  BUN 62* 63* 66* 64* 52*  CREATININE 0.78 0.79 0.75 0.72 0.67  CALCIUM 8.7* 8.6* 8.6* 8.8* 8.7*  MG 2.5* 2.3 2.2 2.1 1.8  PHOS 4.7* 3.6 3.6 4.2 3.1   GFR: Estimated Creatinine Clearance: 71.7 mL/min (by C-G formula based on SCr of 0.67 mg/dL). Liver Function Tests: Recent Labs  Lab 03/26/22 0323 03/27/22 0355 03/28/22 0305 03/29/22 0255  03/30/22 0320  AST 42* 67* 42* 39 45*  ALT 30 49* 36 34 41  ALKPHOS 86 104 76 72 107  BILITOT 0.4 0.5 0.7 0.8 0.6  PROT 6.1* 6.2* 6.0* 5.9* 6.5  ALBUMIN 3.1* 3.1* 3.3* 3.5 3.8   No results for input(s): "LIPASE", "AMYLASE" in the last 168 hours. No results for input(s): "AMMONIA" in the last 168  hours. Coagulation Profile: No results for input(s): "INR", "PROTIME" in the last 168 hours. Cardiac Enzymes: No results for input(s): "CKTOTAL", "CKMB", "CKMBINDEX", "TROPONINI" in the last 168 hours. BNP (last 3 results) No results for input(s): "PROBNP" in the last 8760 hours. HbA1C: No results for input(s): "HGBA1C" in the last 72 hours. CBG: Recent Labs  Lab 03/29/22 1124 03/29/22 1751 03/29/22 2232 03/30/22 0738 03/30/22 1155  GLUCAP 192* 216* 342* 115* 195*   Lipid Profile: Recent Labs    03/29/22 0255  TRIG 59   Thyroid Function Tests: No results for input(s): "TSH", "T4TOTAL", "FREET4", "T3FREE", "THYROIDAB" in the last 72 hours. Anemia Panel: No results for input(s): "VITAMINB12", "FOLATE", "FERRITIN", "TIBC", "IRON", "RETICCTPCT" in the last 72 hours. Sepsis Labs: No results for input(s): "PROCALCITON", "LATICACIDVEN" in the last 168 hours.  No results found for this or any previous visit (from the past 240 hour(s)).   Radiology Studies: DG CHEST PORT 1 VIEW  Result Date: 03/30/2022 CLINICAL DATA:  Shortness of breath. EXAM: PORTABLE CHEST 1 VIEW COMPARISON:  03/28/2022 FINDINGS: Left arm PICC line tip is at the superior cavoatrial junction. Stable cardiomediastinal contours. Persistent bilateral pleural effusions, right greater than left. Mild increase interstitial markings concerning for pulmonary edema. No pneumothorax. No acute osseous findings. IMPRESSION: 1. Persistent bilateral pleural effusions, right greater than left. 2. Suspect mild pulmonary edema. Electronically Signed   By: Kerby Moors M.D.   On: 03/30/2022 06:46    Scheduled Meds:  (feeding  supplement) PROSource Plus  30 mL Oral BID BM   acetaminophen  1,000 mg Oral TID   Chlorhexidine Gluconate Cloth  6 each Topical Daily   cholecalciferol  1,000 Units Oral Daily   dexamethasone (DECADRON) injection  4 mg Intravenous Q24H   enoxaparin (LOVENOX) injection  40 mg Subcutaneous Q24H   famotidine  10 mg Oral BID   feeding supplement  237 mL Oral TID BM   fentaNYL  1 patch Transdermal Q72H   furosemide  80 mg Intravenous Q12H   gabapentin  300 mg Oral TID   insulin aspart  0-20 Units Subcutaneous Q6H   ipratropium  0.5 mg Nebulization TID   levalbuterol  0.63 mg Nebulization TID   levothyroxine  50 mcg Oral Q0600   liothyronine  25 mcg Oral Daily   lipase/protease/amylase  24,000 Units Oral TID WC   metoprolol tartrate  12.5 mg Oral BID   mupirocin cream   Topical Once   pantoprazole  40 mg Oral BID   PHENObarbital  32.5 mg Intravenous QHS   pyridOXINE  100 mg Oral Daily   silver sulfADIAZINE   Topical Daily   sodium chloride flush  10-40 mL Intracatheter Q12H   vitamin A  50,000 Units Oral Daily   vitamin B-12  100 mcg Oral Daily   vitamin E  400 Units Oral Daily   zinc sulfate  220 mg Oral Daily   Continuous Infusions:  albumin human 12.5 g (03/30/22 1400)   ampicillin-sulbactam (UNASYN) IV 3 g (03/30/22 1233)   dextrose 80 mL/hr at 03/30/22 0859   TPN ADULT (ION) Stopped (03/30/22 0800)   TPN ADULT (ION)      LOS: 16 days   Raiford Noble, DO [facility] Available via Epic secure chat 7am-7pm After these hours, please refer to coverage provider listed on [email or web address] 03/30/2022, 5:14 PM

## 2022-03-30 NOTE — Progress Notes (Addendum)
PHARMACY - TOTAL PARENTERAL NUTRITION CONSULT NOTE   Indication:  chronic severe malnutrition with desquamating rash  Patient Measurements: Height: 5\' 8"  (172.7 cm) Weight: 64.8 kg (142 lb 13.7 oz) IBW/kg (Calculated) : 63.9 TPN AdjBW (KG): 46 Body mass index is 21.72 kg/m.  Assessment: 67 year old female with severe malnutrition related to chronic illness (including chronic pancreatitis, h/o multiple gastric surgeries including gastric bypass, GERD, Vit D def, know malabsorption issues ). Patient with nonhealing desquamating rash likely related to significant nutritional/vitamin deficiencies. Pharmacy consulted to dose TPN to accelerate improvement in nutritional deficiencies. Initially planning for 7-10 days of TPN.  Glucose / Insulin: no hx diabetes. CBG goal <150. - insulin added to TPN 1/10; CBGs trending down with increased amount of insulin in TPN 1/12,  removed 1/13, decr 1/14. - CBGs now back tup to 342 last PM, 141 on BMET today (range 141-342) - sSSI: 25 units/24 hr on resistant SSI q6h - Dexamethasone 8 mg IV q24h started 1/9, decreased 4mg  q24h on 1/13  Electrolytes: K 3.3 low, Cl low, CoCa 8.86 (no Phos or Ca in TPN).Mg 1.8, Phos WNL. Will adjust in TPN and supplement K+ Renal: SCr WNL, BUN elevated and continues to trend up Hepatic: AST/ALT slightly elevated, albumin up to 3.8 - TG 59 stable GI/Nutrition meds: Vit D, Pepcid 10 BID, Creon TIDWC, now po PPI/12 hrs, Vit B6, Vit A, Vit B12, Vit E, and Zinc  Intake / Output:  - UOP: 4650 mL, LBM 1/14 - No mIVF - Lasix 80mg  IV q12h + Albumin q8h, significant volume overload noted (weight 142lbs down) - po: Regular diet 450ml on flowsheet (Prosource 103ml BID, Ensure TID)  GI Imaging: NA GI Surgeries / Procedures: NA  Central access: 1/3 TPN start date: 1/3  Nutritional Goals: Goal TPN rate is 80 mL/hr (provides 100 g of protein and 1954 kcals per day) Ensure provides 350kcal and 16g protein per bottle (receiving  TID) Prosource provides 15g protein/100cal per 34ml (receiving BID)  RD Assessment: Estimated Needs Total Energy Estimated Needs: 1900-2100 Total Protein Estimated Needs: 95-105g Total Fluid Estimated Needs: 2L/day  Current Nutrition: regular diet ordered, but no intake charted in past 24hr -MD reports intake improving -Enteral supplements:  Ensure Plus TID (all charted 1/14), Prosource BID (charted)  Plan:  Per discussion with [facility] on 1/9, planning for a 10 day duration of TPN. Today is day #13. Per discussion with [facility] today 1/14, continue with TPN today and plan for calorie count to assess intake.Calorie Count start 1/14 with RD follow-up.  At 1800: Continue TPN at goal rate 80 mL/hr (3204 kcal, 178g protein with TPN + supplements). Discussed with dietician this AM. Electrolytes in TPN:  Na 150 mEq/L,  K 60 mEq/L (will provide extra 6meq over 24h) Ca 5 mEq/L incr,  Mg 6 mEq/L, (will provide extra 1g over 24h) Phos 0 mmol/L. Max Cl Add standard MVI and trace elements to TPN (provides 60 mcg selenium, adequate per discussion with RD) Add thiamine 884 mg and folic acid 1 mg to TPN Increase to 20 units of insulin in TPN. Continue CBG checks q6h + resistant SSI Monitor TPN labs on Mon/Thurs. K runs 13meq x 4   Carolyn [name], PharmD, BCPS Clinical Staff Pharmacist [email or web address] 03/30/22 7:18 AM

## 2022-03-31 ENCOUNTER — Inpatient Hospital Stay (HOSPITAL_COMMUNITY): Payer: Medicare PPO

## 2022-03-31 DIAGNOSIS — A419 Sepsis, unspecified organism: Secondary | ICD-10-CM | POA: Diagnosis not present

## 2022-03-31 DIAGNOSIS — J690 Pneumonitis due to inhalation of food and vomit: Secondary | ICD-10-CM | POA: Diagnosis not present

## 2022-03-31 DIAGNOSIS — I5033 Acute on chronic diastolic (congestive) heart failure: Secondary | ICD-10-CM | POA: Diagnosis not present

## 2022-03-31 DIAGNOSIS — J9601 Acute respiratory failure with hypoxia: Secondary | ICD-10-CM | POA: Diagnosis not present

## 2022-03-31 LAB — CBC WITH DIFFERENTIAL/PLATELET
Abs Immature Granulocytes: 0.14 10*3/uL — ABNORMAL HIGH (ref 0.00–0.07)
Basophils Absolute: 0 10*3/uL (ref 0.0–0.1)
Basophils Relative: 0 %
Eosinophils Absolute: 0.1 10*3/uL (ref 0.0–0.5)
Eosinophils Relative: 1 %
HCT: 27.2 % — ABNORMAL LOW (ref 36.0–46.0)
Hemoglobin: 8.2 g/dL — ABNORMAL LOW (ref 12.0–15.0)
Immature Granulocytes: 1 %
Lymphocytes Relative: 7 %
Lymphs Abs: 0.8 10*3/uL (ref 0.7–4.0)
MCH: 30.8 pg (ref 26.0–34.0)
MCHC: 30.1 g/dL (ref 30.0–36.0)
MCV: 102.3 fL — ABNORMAL HIGH (ref 80.0–100.0)
Monocytes Absolute: 0.6 10*3/uL (ref 0.1–1.0)
Monocytes Relative: 5 %
Neutro Abs: 9.7 10*3/uL — ABNORMAL HIGH (ref 1.7–7.7)
Neutrophils Relative %: 86 %
Platelets: 239 10*3/uL (ref 150–400)
RBC: 2.66 MIL/uL — ABNORMAL LOW (ref 3.87–5.11)
RDW: 16.6 % — ABNORMAL HIGH (ref 11.5–15.5)
WBC: 11.3 10*3/uL — ABNORMAL HIGH (ref 4.0–10.5)
nRBC: 0 % (ref 0.0–0.2)

## 2022-03-31 LAB — COMPREHENSIVE METABOLIC PANEL
ALT: 33 U/L (ref 0–44)
AST: 33 U/L (ref 15–41)
Albumin: 3.6 g/dL (ref 3.5–5.0)
Alkaline Phosphatase: 86 U/L (ref 38–126)
Anion gap: 9 (ref 5–15)
BUN: 52 mg/dL — ABNORMAL HIGH (ref 8–23)
CO2: 33 mmol/L — ABNORMAL HIGH (ref 22–32)
Calcium: 9.2 mg/dL (ref 8.9–10.3)
Chloride: 99 mmol/L (ref 98–111)
Creatinine, Ser: 0.68 mg/dL (ref 0.44–1.00)
GFR, Estimated: >60 mL/min (ref >60)
Glucose, Bld: 89 mg/dL (ref 70–99)
Potassium: 4.1 mmol/L (ref 3.5–5.1)
Sodium: 141 mmol/L (ref 135–145)
Total Bilirubin: 0.5 mg/dL (ref 0.3–1.2)
Total Protein: 5.8 g/dL — ABNORMAL LOW (ref 6.5–8.1)

## 2022-03-31 LAB — MAGNESIUM: Magnesium: 2.3 mg/dL (ref 1.7–2.4)

## 2022-03-31 LAB — GLUCOSE, CAPILLARY
Glucose-Capillary: 132 mg/dL — ABNORMAL HIGH (ref 70–99)
Glucose-Capillary: 75 mg/dL (ref 70–99)
Glucose-Capillary: 124 mg/dL — ABNORMAL HIGH (ref 70–99)
Glucose-Capillary: 295 mg/dL — ABNORMAL HIGH (ref 70–99)
Glucose-Capillary: 280 mg/dL — ABNORMAL HIGH (ref 70–99)
Glucose-Capillary: 196 mg/dL — ABNORMAL HIGH (ref 70–99)

## 2022-03-31 LAB — PHOSPHORUS: Phosphorus: 4.4 mg/dL (ref 2.5–4.6)

## 2022-03-31 MED ORDER — TRAVASOL 10 % IV SOLN
INTRAVENOUS | Status: AC
  Filled 2022-03-31: qty 998.4

## 2022-03-31 MED ORDER — FENTANYL 100 MCG/HR TD PT72
1.0000 | MEDICATED_PATCH | TRANSDERMAL | Status: DC
  Administered 2022-03-31: 1 via TRANSDERMAL
  Filled 2022-03-31: qty 1

## 2022-03-31 NOTE — TOC Progression Note (Signed)
Transition of Care [facility]) - Progression Note    Patient Details  Name: Carolyn [name] MRN: [id number] Date of Birth: February 02, 1956  Transition of Care [facility]) CM/SW Contact  Leeroy Cha, RN Phone Number: 03/31/2022, 11:19 AM  Clinical Narrative:    Family still wants [name] for snf.  Pt progressing, remains on tpn.   Expected Discharge Plan: [place] Barriers to Discharge: Continued Medical Work up  Expected Discharge Plan and Services   Discharge Planning Services: CM Consult Post Acute Care Choice: [place] arrangements for the past 2 months: Single Family Home                                       Social Determinants of Health (SDOH) Interventions SDOH Screenings   Food Insecurity: No Food Insecurity (03/15/2022)  Housing: Low Risk  (03/15/2022)  Transportation Needs: No Transportation Needs (03/15/2022)  Utilities: At Risk (03/15/2022)  Depression (PHQ2-9): Low Risk  (07/22/2020)  Tobacco Use: Low Risk  (03/13/2022)    Readmission Risk Interventions   No data to display

## 2022-03-31 NOTE — Progress Notes (Signed)
PROGRESS NOTE    Carolyn [name]  ZOX:[id number] DOB: 1955-05-08 DOA: 03/13/2022 PCP: Pcp, No    Chief Complaint  Patient presents with   Skin Problem    Brief Narrative:  HPI This is a 67 year old female, past medical history of chronic pancreatitis, history of prior alcoholism, gastroesophageal reflux, prior vitamin D deficiency documented. She has known malabsorption issues and has been on Creon. She presents with cellulitis and sepsis related to a ongoing rash of the hands and feet. It appears that the rash has been going on for several weeks continued to get worse. As started off with a small blister the blister then ruptured the skin started to dry out crack and flake. She is starting to flake and desquamate portions of the hands and feet. She developed increased pain and swelling in the right hand that developed redness that tracks into the forearm. She presents to the ER with an elevated white count and was treated with antibiotics for concern of sepsis.  Patient admitted to critical care team subsequently transferred to [facility] service on 01/14/2021 after stabilization noted to be in significant pain, palliative care GI consulted. -Patient noted to have been transfused 2 units packed red blood cells given trending hemoglobin. -Patient seen by GI for further evaluation and also by palliative care who have adjusted pain medications patient started on IV Decadron with improvement with pain management. -Patient also noted to go into volume overload, placed on IV Lasix with good diuresis with adjustment in diuretics. -Patient will also with on calorie count which have improved TPN can be weaned down.  Assessment & Plan:   Principal Problem:   Severe sepsis (HCC) Active Problems:   Severe protein-calorie malnutrition (HCC)   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HCC)   Acute respiratory failure with hypoxia (HCC)   Chronic gastric ulcer with perforation (HCC)   Desquamative dermatitis   Sepsis  (HCC)   Cellulitis of left lower extremity   Hypophosphatemia   Anemia of chronic disease   Localized swelling of right upper extremity   NSVT (nonsustained ventricular tachycardia) (HCC)   History of gastric ulcer   Abnormal loss of weight   Aspiration pneumonia of both lungs (HCC)   Acute on chronic diastolic CHF (congestive heart failure) (HCC)   #1 severe sepsis secondary to discriminative dermatitis with superimposed cellulitis and possible underlying osteomyelitis -Patient noted to have presented with complicated nonhealing desquamative dermatitis involving hands and feet complicated by severe sepsis due to cellulitis/wound infection. -MRI of bilateral hands and feet done without any definite evidence of osteomyelitis. -Patient initially admitted to the critical care service, concern for probable vitamin/nutritional deficiencies versus paraneoplastic syndromes. -Concern for possible nutritional deficiencies, B2, B6, tryptophan, zinc, thiamine etc. could lead to this. -Patient noted to be prior alcoholic now with a chronic pancreatitis on Creon and likely has pre-existing condition of malabsorption as well as possible gastric surgery unsure as to what the patient has had this on not however per PCCM. -Patient placed empirically on IV vancomycin, IV Rocephin and subsequently transition to doxycycline and Augmentin and just completed a 10-day course of antibiotic treatment 03/22/2022.  -Patient noted to go into acute hypoxic respiratory failure with concerns for possible aspiration pneumonia and volume overload and placed empirically on IV Unasyn to treat for total of 7 to 10 days. -Continue vitamin D3, vitamin B12, zinc, vitamin D3. -Continue vitamin A, selenium. -Will need follow-up with dermatology (we have followed in [place]) -Will need outpatient follow-up with podiatry for bone  biopsy to evaluate for osteomyelitis. -Outpatient follow-up with ID which has been  set for 03/22/2022. -Patient unable to ambulate seen by PT would likely require SNF placement. -Patient with ongoing pain, causing tachycardia, continue current regimen and IV Dilaudid as needed. -Continue current pain regimen as recommended by palliative care of Dilaudid 2 mg every 2 hours, fentanyl patch 25 mcg increased to 50 mcg, phenobarbital for anxiety at night.  Patient also placed on Decadron 8 mg IV daily and wean down to 4 mg daily and recommended a long taper over several weeks per palliative care as well as aggressive diuresis and antibiotics with repeat of T3, T4 and cortisol level. -Continue supportive care. -Palliative care following and appreciate input and recommendations.  2.  Acute hypoxic respiratory failure likely secondary to acute on chronic diastolic CHF exacerbation +/- aspiration pneumonia -Patient noted to have a bout of nausea and emesis the evening of 03/22/2022, noted to have increased O2 requirements and required 5 L nasal cannula from being on room air the day prior. -Chest x-ray done with new bilateral patchy airspace opacities possibly due to multifocal infection or pulmonary edema, small bilateral pleural effusions. -BNP noted to be elevated at 3564.6 -Patient on IV albumin every 6 hours. -Patient was placed on IV Lasix and dose uptitrated to 80 mg every 12 hours, patient with good urine output however not properly recorded.  -Current weight of 143.08 pounds.  -O2 sats improving currently 97% on 2 L nasal cannula.  Placed on Lasix 40 mg IV every 12 hours, strict I's and O's, daily weights. -Placed empirically placed on IV antibiotics, likely treat empirically with IV Unasyn for total of 7 to 10 days.  -Repeat CXR in am. -  3.  History of chronic alcoholic pancreatitis -Continue Creon.   4.  Hepatic cirrhosis with ascites/transaminitis -Being followed in the outpatient setting by [facility]. -BP soft and as such diuretics held spironolactone and Lasix held early  on during the hospitalization. -.CT Abdomen and Pelvis done and showed "Marked diffuse subcutaneous body wall edema. Mild ascites. The constellation of findings can be seen the setting of anasarca. Punctate free intraperitoneal gas within the upper abdomen adjacent to numerous surgical clips involving the distal stomach. A site of bowel perforation is not clearly identified and there is no leakage of oral contrast. Correlation for an exogenous source of gas, as can be seen with paracentesis, is recommended. Mild hepatic steatosis. Changes in keeping with chronic pancreatitis."  -Patient with no hepatic encephalopathy. -Patient currently on IV Lasix 80 mg every 12 hours due to concerns for volume overload with good diuresis. -Outpatient follow-up with GI.  5.  Gastric chronic ulcer with contained perforation -Stable. -Patient with continued trickling down of hemoglobin, status post transfusion 3 units total of packed red blood cells hemoglobin currently at 8.8 from 10.1 yesterday. 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   -Hemoglobin seems to be trickling back down.   -Continue PPI twice daily, Pepcid twice daily.  -GI consulted, assessed patient, CT chest abdomen and pelvis obtained per GI which showed multi focal groundglass pulmonary infiltrates new from 03/15/2022 more prevalent within the lung apices bilaterally possibly related to atypical infection in the acute setting, large right and small left pleural effusions, marked diffuse subcutaneous body wall edema mild ascites can be seen with anasarca.  Punctuate  free intraperitoneal gas within the upper abdomen adjacent to numerous surgical clips involving the distal stomach.  A site of bowel perforation is not clearly identified and no leakage of contrast noted.   Correlate with an exogenous source of gas as can be seen with paracentesis recommended.  Hepatic steatosis.  Changes of chronic pancreatitis.  No definite superimposed acute peripancreatic inflammatory changes noted. -Patient assessed by GI who reviewed CT scans, discussed patient's prior surgeries and feel at this time patient does not need any further endoscopic evaluation and to follow for now. -Follow H&H, transfusion threshold hemoglobin  < 8.   6.  Hypothyroidism -TSH noted at 33.958 on 03/15/2022. -Continue Synthroid 50 mcg's daily.   -Will need repeat labs in 4 to 6 weeks in the outpatient setting.    6.  Hypophosphatemia/hypomagnesemia/hypokalemia -Magnesium at 2.3.  -Phosphorus at 4.4, potassium at 4.1.Marland Kitchen -Repletion per pharmacy.   -Repeat labs in the AM.    7.  Severe protein calorie malnutrition -Patient seen by dietitian, PICC line placed and patient started on TPN per pharmacy. -Albumin level noted at< 1.5 -IV albumin every 6 hours x 24 hours.   -Continue TPN for total of 10 days which has been extended given that patient is improving and undergoing calorie counts. -As calorie counts improve could potentially likely wean patient off TPN in the next 24 to 48 hours.  8.  Anemia of chronic disease/folate deficiency -Hemoglobin noted to go as low as 6.5, status post transfusion 1 unit packed red blood cells.   Hemoglobin noted at 7.1 on 03/20/2022.  Status posttransfusion 2 units packed red blood cells hemoglobin currently at 8.2 from 10.1 (03/22/2022 ). 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   -Hemoglobin seems to be trickling back down.   -GI consulted for further evaluation and management to see if repeat EGD needed as well.   -CT chest abdomen and pelvis done on 03/24/2022 per GI recommendations with  multi focal groundglass pulmonary infiltrates new from 03/15/2022 more prevalent within the lung apices bilaterally possibly related to atypical infection in the acute setting, large right and small left pleural effusions, marked diffuse subcutaneous body wall edema mild ascites can be seen with anasarca.  Punctuate free intraperitoneal gas within the upper abdomen adjacent to numerous surgical clips involving the distal stomach.  A site of bowel perforation is not clearly identified and no leakage of contrast noted.  Correlate with an exogenous source of gas as can be seen with paracentesis recommended.  Hepatic steatosis.  Changes of chronic pancreatitis.  No definite superimposed acute peripancreatic inflammatory changes noted. -Patient assessed by GI who reviewed CT scans, discussed patient's prior surgeries and feel at this time patient does not need any further endoscopic evaluation and to follow for now. -Follow H&H, transfusion threshold hemoglobin  < 8.   9.  Right upper extremity swelling and pain -Improved clinically.   -Upper extremity Dopplers negative for DVT.   -Patient on diuretics.   -Keep right upper extremity elevated.    10.  NSVT -Patient noted to have 7 beat run of NSVT on 03/19/2022, patient was asymptomatic.   -Electrolytes repleted potassium today currently at 4.1, magnesium at 2.3 -Hemoglobin noted at 7.1 on 03/20/2022, status post transfusion 3 units packed red blood cells during this hospitalization hemoglobin currently at 8.2. -Repeat labs in the AM.  DVT prophylaxis: Heparin Code Status: Full Family Communication: Updated patient.  No family at bedside.  Disposition: SNF.  Status is: Inpatient Remains inpatient appropriate because: Severity of illness.  Unsafe disposition.   Consultants:  PCCM admission. Infectious disease: Dr. Thedore Mins 03/15/2022 Wound care RN 03/14/2022 Palliative care: Dr. Phillips Odor 03/23/2022 Gastroenterology: Dr. Myrtie Neither III 03/23/2022  Procedures:   Plain films of bilateral feet 03/13/2022 Chest x-ray 03/15/2022, 03/26/2022, 03/28/2022, 03/30/2022, 03/31/2022 MRI left foot and right foot 03/13/2022 PICC line placement 03/17/2022 Transfused 2 units packed red blood cells 03/20/2022 Transfuse 1 unit packed red blood cells 03/15/2022 Right upper extremity Dopplers 03/19/2022 CT chest abdomen and pelvis 03/24/2022 Acute abdominal series 03/23/2022  Antimicrobials:  Anti-infectives (From admission, onward)    Start     Dose/Rate Route Frequency Ordered Stop   03/23/22 1215  Ampicillin-Sulbactam (UNASYN) 3 g in sodium chloride 0.9 % 100 mL IVPB        3 g 200 mL/hr over 30 Minutes Intravenous Every 6 hours 03/23/22 1118     03/19/22 2200  amoxicillin-clavulanate (AUGMENTIN) 875-125 MG per tablet 1 tablet        1 tablet Oral Every 12 hours 03/19/22 1115 03/22/22 2231   03/19/22 2200  doxycycline (VIBRA-TABS) tablet 100 mg        100 mg Oral Every 12 hours 03/19/22 1115 03/22/22 2230   03/14/22 2200  vancomycin (VANCOREADY) IVPB 500 mg/100 mL  Status:  Discontinued        500 mg 100 mL/hr over 60 Minutes Intravenous Every 24 hours 03/13/22 2049 03/19/22 1116   03/13/22 1915  vancomycin (VANCOCIN) IVPB 1000 mg/200 mL premix        1,000 mg 200 mL/hr over 60 Minutes Intravenous  Once 03/13/22 1908 03/14/22 0103   03/13/22 1615  cefTRIAXone (ROCEPHIN) 2 g in sodium chloride 0.9 % 100 mL IVPB        2 g 200 mL/hr over 30 Minutes Intravenous Every 24 hours 03/13/22 1603 03/19/22 1959         Subjective: Sitting up in bed. States appetitie improving. States SOB improved. No CP. States pain better controlled on regimen palliative care has her on.  Patient states shortness of breath is improving.  Denies any ongoing chest pain.  States has been having good urine output.  Objective: Vitals:   03/30/22 2127 03/31/22 0629 03/31/22 0821 03/31/22 1055  BP: 131/83 115/81  121/82  Pulse:    91  Resp:    10  Temp: 97.8 F (36.6 C) 98.1 F (36.7 C)  (!)  97.5 F (36.4 C)  TempSrc: Oral Oral  Oral  SpO2:   97% 100%  Weight:  64.9 kg    Height:        Intake/Output Summary (Last 24 hours) at 03/31/2022 1235 Last data filed at 03/31/2022 1155 Gross per 24 hour  Intake 3049.17 ml  Output 800 ml  Net 2249.17 ml    Filed Weights   03/29/22 0443 03/30/22 0500 03/31/22 0629  Weight: 64.9 kg 64.8 kg 64.9 kg    Examination:  General exam: Frail.  Cachectic. Respiratory system: Some decreased breath sounds in the bases.  No significant rhonchorous coarse breath sounds noted.  Some scattered crackles.  Fair air movement.  No wheezing.  Speaking in full sentences.  No use of accessory muscles of respiration.  Coarse rhonchorous gurgly breath sounds noted diffusely.  No wheezing.  Cardiovascular system: Tachycardia.  No murmurs rubs or gallops.  Trace bilateral lower extremity edema.  Gastrointestinal system: Abdomen soft, nontender, nondistended, positive bowel sounds.  No rebound.  No guarding.   Central nervous system: Alert and oriented.  Moving extremities spontaneously.  No focal neurological deficits.  Extremities: Bilateral hands, bilateral feet wrapped in gauze/Kerlix.  Right upper extremity with decreased swelling, decreased tenderness to palpation.   Skin: Decreased erythema right upper extremity, severe desquamative dermatitis changes right upper extremity bilateral feet.  Psychiatry: Judgement and insight appear normal. Mood & affect appropriate.     Data Reviewed: I have personally reviewed following labs and imaging studies  CBC: Recent Labs  Lab 03/27/22 0355 03/28/22 0305 03/29/22 0255 03/30/22 0320 03/31/22 0329  WBC 11.2* 9.7 9.8 11.1* 11.3*  NEUTROABS 9.5* 7.7 8.1* 9.4* 9.7*  HGB 8.6* 8.4* 8.3* 8.2* 8.2*  HCT 27.5* 27.3* 27.0* 26.7* 27.2*  MCV 101.1* 101.9* 100.4* 101.9* 102.3*  PLT 215 240 220 229 239     Basic Metabolic Panel: Recent Labs  Lab 03/27/22 0355 03/28/22 0305 03/29/22 0255 03/30/22 0320  03/31/22 0329  NA 142 142 142 139 141  K 3.8 3.5 3.5 3.3* 4.1  CL 101 99 98 94* 99  CO2 30 34* 34* 32 33*  GLUCOSE 127* 120* 136* 141* 89  BUN 63* 66* 64* 52* 52*  CREATININE 0.79 0.75 0.72 0.67 0.68  CALCIUM 8.6* 8.6* 8.8* 8.7* 9.2  MG 2.3 2.2 2.1 1.8 2.3  PHOS 3.6 3.6 4.2 3.1 4.4     GFR: Estimated Creatinine Clearance: 71.7 mL/min (by C-G formula based on SCr of 0.68 mg/dL).  Liver Function Tests: Recent Labs  Lab 03/27/22 0355 03/28/22 0305 03/29/22 0255 03/30/22 0320 03/31/22 0329  AST 67* 42* 39 45* 33  ALT 49* 36 34 41 33  ALKPHOS 104 76 72 107 86  BILITOT 0.5 0.7 0.8 0.6 0.5  PROT 6.2* 6.0* 5.9* 6.5 5.8*  ALBUMIN 3.1* 3.3* 3.5 3.8 3.6     CBG: Recent Labs  Lab 03/30/22 1733 03/30/22 2323 03/31/22 0646 03/31/22 0820 03/31/22 1103  GLUCAP 153* 234* 132* 75 124*      No results found for this or any previous visit (from the past 240 hour(s)).        Radiology Studies: DG CHEST PORT 1 VIEW  Result Date: 03/31/2022 CLINICAL DATA:  Shortness of breath EXAM: PORTABLE CHEST 1 VIEW COMPARISON:  03/30/2022 FINDINGS: Left arm PICC line tip is in the superior cavoatrial junction. Stable cardiomediastinal contours. Decreased volume of left pleural effusion. Posterior layering right pleural effusion is unchanged in the interval. Remote left posterior rib fractures, unchanged. IMPRESSION: 1. Decreased volume of left pleural effusion. 2. Unchanged posterior layering right pleural effusion. Electronically Signed   By: Kerby Moors M.D.   On: 03/31/2022 08:13   DG CHEST PORT 1 VIEW  Result Date: 03/30/2022 CLINICAL DATA:  Shortness of breath. EXAM: PORTABLE CHEST 1 VIEW COMPARISON:  03/28/2022 FINDINGS: Left arm PICC line tip is at the superior cavoatrial junction. Stable cardiomediastinal contours. Persistent bilateral pleural effusions, right greater than left. Mild increase interstitial markings concerning for pulmonary edema. No pneumothorax. No acute osseous  findings. IMPRESSION: 1. Persistent bilateral pleural effusions, right greater than left. 2. Suspect mild pulmonary edema. Electronically Signed   By: Kerby Moors M.D.   On: 03/30/2022 06:46        Scheduled Meds:  (feeding supplement) PROSource Plus  30 mL Oral BID BM   acetaminophen  1,000 mg Oral TID   Chlorhexidine Gluconate Cloth  6 each Topical Daily   cholecalciferol  1,000 Units Oral Daily   dexamethasone (DECADRON) injection  4 mg Intravenous Q24H   enoxaparin (LOVENOX) injection  40 mg Subcutaneous Q24H   famotidine  10 mg Oral BID   feeding supplement  237 mL Oral TID BM   fentaNYL  1 patch Transdermal Q72H   furosemide  80 mg Intravenous Q12H   gabapentin  300 mg Oral TID   insulin aspart  0-20 Units Subcutaneous Q6H   levothyroxine  50 mcg Oral Q0600   liothyronine  25 mcg Oral Daily   lipase/protease/amylase  24,000 Units Oral TID WC   metoprolol tartrate  12.5 mg Oral BID   mupirocin cream   Topical Once   pantoprazole  40 mg Oral BID   PHENObarbital  32.5 mg Intravenous QHS   pyridOXINE  100 mg Oral Daily   silver sulfADIAZINE   Topical Daily   sodium chloride flush  10-40 mL Intracatheter Q12H   vitamin A  50,000 Units Oral Daily   vitamin B-12  100 mcg Oral Daily   vitamin E  400 Units Oral Daily   zinc sulfate  220 mg Oral Daily   Continuous Infusions:  albumin human 12.5 g (03/31/22 0530)   ampicillin-sulbactam (UNASYN) IV 3 g (03/31/22 1154)   TPN ADULT (ION) 80 mL/hr at 03/30/22 1818   TPN ADULT (ION)       LOS: 17 days    Time spent: 40 minutes    Ramiro Harvest, MD [facility]   To contact the attending provider between 7A-7P or the covering provider during after hours 7P-7A, please log into the web site [email or web address] and access using universal [place] password for that web site. If you do not have the password, please call the hospital operator.  03/31/2022, 12:35 PM

## 2022-03-31 NOTE — Progress Notes (Signed)
PHARMACY - TOTAL PARENTERAL NUTRITION CONSULT NOTE   Indication:  chronic severe malnutrition with desquamating rash  Patient Measurements: Height: 5\' 8"  (172.7 cm) Weight: 64.9 kg (143 lb 1.3 oz) IBW/kg (Calculated) : 63.9 TPN AdjBW (KG): 46 Body mass index is 21.76 kg/m.  Assessment: 67 year old female with severe malnutrition related to chronic illness (including chronic pancreatitis, h/o multiple gastric surgeries including gastric bypass, GERD, Vit D def, know malabsorption issues ). Patient with nonhealing desquamating rash likely related to significant nutritional/vitamin deficiencies. Pharmacy consulted to dose TPN to accelerate improvement in nutritional deficiencies. Initially planning for 7-10 days of TPN.  - 1/16: TPN became disconnected in AM. Hang D10W for the rest of the day.  Glucose / Insulin: no hx diabetes. CBG goal <150. - insulin added to TPN 1/10; CBGs trending down with increased amount of insulin in TPN 1/12,  removed 1/13, decr 1/14.  - CBGs 115-153 with D10W running yesterday 1/16 (while TPN off due to disconnection, 4 units SSI charted), 234, 132, 75 this AM on TPN overnight (14 units SSI charted) - Dexamethasone 8 mg IV q24h started 1/9, decreased 4mg  q24h on 1/13  Electrolytes: K 4.1 replaced, CoCa 9.52,. Mg 2.3, Phos WNL (none in TPN). Bicarb slightly elevated (on max Cl in TPN) Renal: SCr WNL, BUN elevated and continues to trend up Hepatic: LFT's WNL.  albumin up to 3.6 - TG 59 stable GI/Nutrition meds: Vit D, Pepcid 10 BID, Creon TIDWC, now po PPI/12 hrs, Vit B6, Vit A, Vit B12, Vit E, and Zinc  Intake / Output:  - UOP: not charted  - LBM 1/16 x 2 noted - No mIVF - Lasix 80mg  IV q12h + Albumin 12.5g q8h, significant volume overload noted (weight 143 lbs) - po: Regular diet 441ml on flowsheet (Prosource 6ml BID, Ensure TID)  GI Imaging: NA GI Surgeries / Procedures: NA  Central access: 1/3 TPN start date: 1/3  Nutritional Goals: Goal TPN rate is  80 mL/hr (provides 100 g of protein and 1954 kcals per day) Ensure provides 350kcal and 16g protein per bottle (receiving TID) Prosource provides 15g protein/100cal per 3ml (receiving BID)  RD Assessment: Estimated Needs Total Energy Estimated Needs: 1900-2100 Total Protein Estimated Needs: 95-105g Total Fluid Estimated Needs: 2L/day  Current Nutrition:  - Regular diet ordered, but intake minimal. -Enteral supplements:  Ensure Plus TID (all charted 1/14), Prosource BID (charted)  Plan:  Per discussion with [facility] on 1/9, planning for a 10 day duration of TPN. Today is day #14. F/u calorie counts with RD 1/17  At 1800: Continue TPN at goal rate 80 mL/hr (3204 kcal, 178g protein with TPN + supplements). Discussed with dietician this AM. Electrolytes in TPN:  Na 150 mEq/L,  K 60 mEq/L (will provide extra 49meq over 24h) Ca 5 mEq/L incr,  Mg 6 mEq/L, (will provide extra 1g over 24h) Phos 0 mmol/L. Max Cl Add standard MVI and trace elements to TPN (provides 60 mcg selenium, adequate per discussion with RD) Add thiamine 440 mg and folic acid 1 mg to TPN Decrease to 15 units of insulin in TPN with glucose 75 this AM. CBGs highly variable. Continue CBG checks q6h + resistant SSI. Monitor TPN labs on Mon/Thurs.   Jhovany Weidinger S. Alford Highland, PharmD, BCPS Clinical Staff Pharmacist [email or web address] 03/31/22 7:41 AM

## 2022-03-31 NOTE — Progress Notes (Signed)
Nutrition Follow-up  DOCUMENTATION CODES:   Severe malnutrition in context of chronic illness, Underweight  INTERVENTION:  - Regular diet. - Ensure Plus High Protein po BID, each supplement provides 350 kcal and 20 grams of protein as tolerated - Prosource Plus PO BID, each provides 100 kcals and 15g protein   - TPN management per Pharmacy - Continue 60 mcg Selenium  - Continue folic acid and Thiamine   - Supplement 100,000 units of Vitamin A x 3 days (complete) - followed by 50,000 units x 14 days (end date 1/22) - Recommend 5,000 units daily following repletion   - 400 units Vitamin E daily - 100 mg Vitamin B-6 daily x 30 days.    - 181mcg vitamin B12 as medically appropriate.  - Patient has received 17 days of zinc supplementation but no lab for deficiency noted, will discontinue to prevent copper deficiency.   - Patient should continue Vitamin D and A supplementation at discharge as she has history of deficiency  - If patient weaned off TPN, she will need PO selenium and liquid multivitamin.   - Daily weights while on TPN   NUTRITION DIAGNOSIS:   Severe Malnutrition related to chronic illness (chronic pancreatitis, h/o multiple gastric surgeries including gastric bypass) as evidenced by severe fat depletion, severe muscle depletion. *ongoing  GOAL:   Patient will meet greater than or equal to 90% of their needs *met  MONITOR:   PO intake, Supplement acceptance, Weight trends, Labs, I & O's, Skin  REASON FOR ASSESSMENT:   Consult Assessment of nutrition requirement/status, Calorie Count  ASSESSMENT:   67 year old female, past medical history of chronic pancreatitis, history of prior alcoholism, gastroesophageal reflux, prior vitamin D deficiency documented.  She has known malabsorption issues and has been on Creon.  She presents with cellulitis and sepsis related to a ongoing rash of the hands and feet. She presents to the ER with an elevated white count and  was treated with antibiotics for concern of sepsis.  Calorie count complete with results below. Intake has improved over the past few days.  She is documented to have had 50-75% of meals yesterday (no intake prior to this documented since the 11th). She continues to drink all her supplements in addition to his  Calorie count day 1 (1/14): Breakfast: 25% egg and swiss cheese croissant + 2 diet gingerales = 121 kcals, 5g protein Lunch: No lunch ordered Dinner: none documented Supplements: 3 Ensure Plus, 2 Prosource Plus = 1250 kcals, 78g protein Total intake: 1371 kcal (72% of minimum estimated needs)  83 protein (87% of minimum estimated needs)   Calorie count day 2 (1/15): Breakfast: 25% belgian waffles, 50% bacon, 100% fruit cup, 100% orange juice, 1.5 cups apple juice = 672 kcal, 8g protein Lunch: 25% [place] deli sandwich, 100% diet ginger ale, 2 cups apple juice = 180 kcal, 6g protein Dinner: nothing documented for calorie count or in EMR Supplements: 3 Ensure, 2 Prosource Plus = 1250 kcals, 78g protein  Total intake: 2102 kcal (100% of maximum estimated needs)  92 protein (97% of minimum estimated needs)  Calorie count day 3 (1/16): Breakfast: 426 kcals, 7g protein Lunch: 485 kcal, 19g protein Dinner: 545 kcal, 15g protein Snacks: 270 kcal, 0g Supplements: 2 and 1/4 Ensures, 2 Prosource = 985 kcal, 66g protein Total intake: 2166 kcal (100% of estimated needs)  107 protein (100% of estimated needs)   Patient remains on full TPN at this time. Remain concerned patient has malabsorption. Per discussion with MD, patient  will likely need to wean off TPN for placement, especially since she is eating well and on a regular diet.  Will continue to monitor PO intake and acceptance of supplements.  Patient will need oral selenium and a multivitamin if TPN weaned off. Weight has trended up this admission however patient retaining fluid and experiencing edema so suspect some if fluid  related.   Medications reviewed and include: Creon, 1000 units vitamin D, 100mg  vitamin B6, 50000 units vitamin A, 139mcg vitamin B12, 400 units vitamin E, 220mg  zinc Insulin, Lasix  Labs reviewed:  Triglycerides 59 (03/29/22) Blood Glucose 75-234 x24 hours   Diet Order:   Diet Order             Diet regular Room service appropriate? Yes; Fluid consistency: Thin  Diet effective now                   EDUCATION NEEDS:  Education needs have been addressed  Skin:  Skin Assessment: Reviewed RN Assessment Skin Integrity Issues:: Other (Comment) Other: red, flaky rashes to bilateral heels, feet and hands  Last BM:  1/16  Height:  Ht Readings from Last 1 Encounters:  03/16/22 5\' 8"  (1.727 m)   Weight:  Wt Readings from Last 1 Encounters:  03/31/22 64.9 kg    BMI:  Body mass index is 21.76 kg/m.  Estimated Nutritional Needs:  Kcal:  1900-2100 Protein:  95-105g Fluid:  2L/day    Samson Frederic RD, LDN For contact information, refer to [facility].

## 2022-03-31 NOTE — Progress Notes (Addendum)
Physical Therapy Treatment Patient Details Name: TORINA EY MRN: [id number] DOB: [birth year] Today's Date: 03/31/2022   History of Present Illness 67 year old female with history of chronic pancreatitis, chronic pain syndrome, GERD, severe malnutrition, osteoporosis who presented to [facility] ED with LE "cellulitis due to breakdown from desquammating wounds" involving bil feet and to lesser extent hands.  Pt admitted 03/13/22 for severe sepsis secondary to discriminative dermatitis with superimposed cellulitis and possible underlying osteomyelitis    PT Comments    Pt able to perform supine to sit using bedrail with head of bed up without assistance. Pt able to maintain sitting balance while performing BLE exercises seated at edge of bed (HR 118), no loss of balance. She was able to perform lateral scoot towards the head of the bed x 3 trials with min assist to advance hips laterally. Pt was encouraged to sit at edge of bed for meals and to perform BLE exercises for strengthening independently. Pt sat edge of bed x 13 minutes during PT session and was assisted with set up for eating her lunch while continuing to sit at edge of bed.     Recommendations for follow up therapy are one component of a multi-disciplinary discharge planning process, led by the attending physician.  Recommendations may be updated based on patient status, additional functional criteria and insurance authorization.  Follow Up Recommendations  Skilled nursing-short term rehab (<3 hours/day) Can patient physically be transported by private vehicle: No   Assistance Recommended at Discharge Frequent or constant Supervision/Assistance  Patient can return home with the following Two people to help with walking and/or transfers;A lot of help with bathing/dressing/bathroom;Assistance with cooking/housework;Assist for transportation;Help with stairs or ramp for entrance   Equipment Recommendations  None recommended by PT     Recommendations for Other Services       Precautions / Restrictions Precautions Precautions: Fall Precaution Comments: monitor BP, HR Restrictions Weight Bearing Restrictions: No     Mobility  Bed Mobility   Bed Mobility: Supine to Sit     Supine to sit: Modified independent (Device/Increase time), HOB elevated     General bed mobility comments: used bedrail, HOB up    Transfers Overall transfer level: Needs assistance Equipment used: None              Lateral/Scoot Transfers: Min assist General transfer comment: lateral scoot towards HOB x 3 with min A using pad to assist with advancing hips, pt denied pain; pt stated she didn't feel she could tolerate sit to stand today, she was fatigued after lateral scoots; HR 118 with seated BLE exercises    Ambulation/Gait                   Stairs             Wheelchair Mobility    Modified Rankin (Stroke Patients Only)       Balance   Sitting-balance support: No upper extremity supported, Feet supported Sitting balance-Leahy Scale: Fair                                      Cognition Arousal/Alertness: Awake/alert Behavior During Therapy: WFL for tasks assessed/performed Overall Cognitive Status: Within Functional Limits for tasks assessed  Exercises General Exercises - Lower Extremity Ankle Circles/Pumps: AROM, Both, 10 reps, Seated Ankle Circles/Pumps Limitations: initially required assist for L ankle, then able to independently perform dorsiflexion Long Arc Quad: AROM, Both, Seated, 15 reps Hip Flexion/Marching: AROM, Both, Seated, 20 reps    General Comments        Pertinent Vitals/Pain Pain Assessment Pain Assessment: No/denies pain Pain Score: 0-No pain    Home Living                          Prior Function            PT Goals (current goals can now be found in the care plan section) Acute  Rehab PT Goals Patient Stated Goal: Less pain, regain IND, be home with her 3 dogs PT Goal Formulation: With patient Time For Goal Achievement: 04/12/22 Potential to Achieve Goals: Fair Progress towards PT goals: Progressing toward goals    Frequency    Min 2X/week      PT Plan Current plan remains appropriate    Co-evaluation              AM-PAC PT "6 Clicks" Mobility   Outcome Measure  Help needed turning from your back to your side while in a flat bed without using bedrails?: A Little Help needed moving from lying on your back to sitting on the side of a flat bed without using bedrails?: A Little Help needed moving to and from a bed to a chair (including a wheelchair)?: A Lot Help needed standing up from a chair using your arms (e.g., wheelchair or bedside chair)?: Total Help needed to walk in hospital room?: Total Help needed climbing 3-5 steps with a railing? : Total 6 Click Score: 11    End of Session   Activity Tolerance: Patient tolerated treatment well Patient left: in bed;with call bell/phone within reach;with nursing/sitter in room Nurse Communication: Mobility status;Other (comment) (pulse ox sensor needs to be replaced, bed pad saturated in urine (pt on purewick)) PT Visit Diagnosis: Difficulty in walking, not elsewhere classified (R26.2)     Time: 7530-0511 PT Time Calculation (min) (ACUTE ONLY): 18 min  Charges:  $Therapeutic Activity: 8-22 mins                     Blondell Reveal Kistler PT 03/31/2022  Acute Rehabilitation Services  Office [phone]

## 2022-04-01 DIAGNOSIS — A419 Sepsis, unspecified organism: Secondary | ICD-10-CM | POA: Diagnosis not present

## 2022-04-01 DIAGNOSIS — J690 Pneumonitis due to inhalation of food and vomit: Secondary | ICD-10-CM | POA: Diagnosis not present

## 2022-04-01 DIAGNOSIS — J9601 Acute respiratory failure with hypoxia: Secondary | ICD-10-CM | POA: Diagnosis not present

## 2022-04-01 DIAGNOSIS — I5033 Acute on chronic diastolic (congestive) heart failure: Secondary | ICD-10-CM | POA: Diagnosis not present

## 2022-04-01 LAB — CBC WITH DIFFERENTIAL/PLATELET
Abs Immature Granulocytes: 0.21 10*3/uL — ABNORMAL HIGH (ref 0.00–0.07)
Basophils Absolute: 0.1 10*3/uL (ref 0.0–0.1)
Basophils Relative: 0 %
Eosinophils Absolute: 0.3 10*3/uL (ref 0.0–0.5)
Eosinophils Relative: 2 %
HCT: 29.9 % — ABNORMAL LOW (ref 36.0–46.0)
Hemoglobin: 9.3 g/dL — ABNORMAL LOW (ref 12.0–15.0)
Immature Granulocytes: 2 %
Lymphocytes Relative: 12 %
Lymphs Abs: 1.7 10*3/uL (ref 0.7–4.0)
MCH: 31.3 pg (ref 26.0–34.0)
MCHC: 31.1 g/dL (ref 30.0–36.0)
MCV: 100.7 fL — ABNORMAL HIGH (ref 80.0–100.0)
Monocytes Absolute: 0.7 10*3/uL (ref 0.1–1.0)
Monocytes Relative: 5 %
Neutro Abs: 10.9 10*3/uL — ABNORMAL HIGH (ref 1.7–7.7)
Neutrophils Relative %: 79 %
Platelets: 273 10*3/uL (ref 150–400)
RBC: 2.97 MIL/uL — ABNORMAL LOW (ref 3.87–5.11)
RDW: 16.4 % — ABNORMAL HIGH (ref 11.5–15.5)
WBC: 13.9 10*3/uL — ABNORMAL HIGH (ref 4.0–10.5)
nRBC: 0 % (ref 0.0–0.2)

## 2022-04-01 LAB — COMPREHENSIVE METABOLIC PANEL
ALT: 32 U/L (ref 0–44)
AST: 34 U/L (ref 15–41)
Albumin: 3.6 g/dL (ref 3.5–5.0)
Alkaline Phosphatase: 90 U/L (ref 38–126)
Anion gap: 10 (ref 5–15)
BUN: 51 mg/dL — ABNORMAL HIGH (ref 8–23)
CO2: 30 mmol/L (ref 22–32)
Calcium: 9.3 mg/dL (ref 8.9–10.3)
Chloride: 100 mmol/L (ref 98–111)
Creatinine, Ser: 0.71 mg/dL (ref 0.44–1.00)
GFR, Estimated: >60 mL/min (ref >60)
Glucose, Bld: 123 mg/dL — ABNORMAL HIGH (ref 70–99)
Potassium: 3.8 mmol/L (ref 3.5–5.1)
Sodium: 140 mmol/L (ref 135–145)
Total Bilirubin: 0.5 mg/dL (ref 0.3–1.2)
Total Protein: 6.4 g/dL — ABNORMAL LOW (ref 6.5–8.1)

## 2022-04-01 LAB — GLUCOSE, CAPILLARY
Glucose-Capillary: 212 mg/dL — ABNORMAL HIGH (ref 70–99)
Glucose-Capillary: 130 mg/dL — ABNORMAL HIGH (ref 70–99)
Glucose-Capillary: 161 mg/dL — ABNORMAL HIGH (ref 70–99)
Glucose-Capillary: 211 mg/dL — ABNORMAL HIGH (ref 70–99)
Glucose-Capillary: 212 mg/dL — ABNORMAL HIGH (ref 70–99)
Glucose-Capillary: 246 mg/dL — ABNORMAL HIGH (ref 70–99)

## 2022-04-01 LAB — PHOSPHORUS: Phosphorus: 3.4 mg/dL (ref 2.5–4.6)

## 2022-04-01 LAB — MAGNESIUM: Magnesium: 2 mg/dL (ref 1.7–2.4)

## 2022-04-01 MED ORDER — TRAVASOL 10 % IV SOLN
INTRAVENOUS | Status: AC
  Filled 2022-04-01: qty 499.2

## 2022-04-01 NOTE — Progress Notes (Signed)
[facility] IP rehab admissions - I received an order for a rehab consult.  Noted PT recommending SNF placement.  Noted patient wants to go to [facility] SNF.  Patient is most appropriate for SNF placement.  I do not think patient would benefit from a short inpatient rehab stay.  Recommend continued pursuit of SNF when medically ready for discharge.  Call me for questions.  [phone]

## 2022-04-01 NOTE — Progress Notes (Signed)
PHARMACY - TOTAL PARENTERAL NUTRITION CONSULT NOTE   Indication:  chronic severe malnutrition with desquamating rash  Patient Measurements: Height: 5\' 8"  (172.7 cm) Weight: 65.1 kg (143 lb 8.3 oz) IBW/kg (Calculated) : 63.9 TPN AdjBW (KG): 46 Body mass index is 21.82 kg/m.  Assessment: 67 year old female with severe malnutrition related to chronic illness (including chronic pancreatitis, h/o multiple gastric surgeries including gastric bypass, GERD, Vit D def, know malabsorption issues ). Patient with nonhealing desquamating rash likely related to significant nutritional/vitamin deficiencies. Pharmacy consulted to dose TPN to accelerate improvement in nutritional deficiencies. Initially planning for 7-10 days of TPN.  - 1/16: TPN became disconnected in AM. Hang D10W for the rest of the day.  Glucose / Insulin: no hx diabetes. CBG goal <150. - insulin added to TPN 1/10; requiring adjustment for variable CBGs - CBGs mostly elevated over past 24hr - Dexamethasone 8 mg IV q24h started 1/9, decreased 4mg  q24h on 1/13  Electrolytes: all WNL Renal: SCr WNL, BUN elevated and continues to trend up Hepatic: LFT's WNL.  albumin up to 3.6 - TG 59 stable GI/Nutrition meds: Vit D, Pepcid 10 BID, Creon TIDWC, now po PPI/12 hrs, Vit B6, Vit A, Vit B12, Vit E, and Zinc  Intake / Output:  - UOP: 3100  - LBM 1/17 x 1 - No mIVF - Lasix 80mg  IV q12h + Albumin 12.5g q8h, significant volume overload noted (weight 143 lbs) - po: Regular diet 973ml on flowsheet (Prosource 25ml BID, Ensure TID)  GI Imaging: NA GI Surgeries / Procedures: NA  Central access: 1/3 TPN start date: 1/3  Nutritional Goals: Goal TPN rate is 80 mL/hr (provides 100 g of protein and 1954 kcals per day) Ensure provides 350kcal and 16g protein per bottle (receiving TID) Prosource provides 15g protein/100cal per 87ml (receiving BID)  RD Assessment: Estimated Needs Total Energy Estimated Needs: 1900-2100 Total Protein  Estimated Needs: 95-105g Total Fluid Estimated Needs: 2L/day  Current Nutrition:  - Calorie count results noted - Enteral supplements:  Ensure Plus TID (all charted), Prosource BID (all charted)  Plan:  Day #15 TPN - see RD notes re: calorie count. Per discussion with MD, ok to begin weaning today.  At 1800: Decrease TPN to 40 mL/hr - 50g protein, 977 kcal per day Electrolytes in TPN: no changes today Na 150 mEq/L,  K 60 mEq/L  Ca 5 mEq/L incr,  Mg 6 mEq/L,  Phos 0 mmol/L. Max Cl Add standard MVI and trace elements to TPN (provides 60 mcg selenium, adequate per discussion with RD) Add thiamine 557 mg and folic acid 1 mg to TPN Decrease to 5 units of insulin in TPN. CBGs highly variable. Continue CBG checks q6h + resistant SSI. Monitor TPN labs on Mon/Thurs and PRN.   Peggyann Juba, PharmD, BCPS Pharmacy: [phone] 04/01/22 7:24 AM

## 2022-04-01 NOTE — Progress Notes (Signed)
PROGRESS NOTE    Carolyn ROTTMAN  HQI:[id number] DOB: [birth year] DOA: 03/13/2022 PCP: Pcp, No    Chief Complaint  Patient presents with   Skin Problem    Brief Narrative:  HPI This is a 67 year old female, past medical history of chronic pancreatitis, history of prior alcoholism, gastroesophageal reflux, prior vitamin D deficiency documented. She has known malabsorption issues and has been on Creon. She presents with cellulitis and sepsis related to a ongoing rash of the hands and feet. It appears that the rash has been going on for several weeks continued to get worse. As started off with a small blister the blister then ruptured the skin started to dry out crack and flake. She is starting to flake and desquamate portions of the hands and feet. She developed increased pain and swelling in the right hand that developed redness that tracks into the forearm. She presents to the ER with an elevated white count and was treated with antibiotics for concern of sepsis.  Patient admitted to critical care team subsequently transferred to [facility] service on 01/14/2021 after stabilization noted to be in significant pain, palliative care GI consulted. -Patient noted to have been transfused 2 units packed red blood cells given trending hemoglobin. -Patient seen by GI for further evaluation and also by palliative care who have adjusted pain medications patient started on IV Decadron with improvement with pain management. -Patient also noted to go into volume overload, placed on IV Lasix with good diuresis with adjustment in diuretics. -Patient will also with on calorie count which have improved TPN can be weaned down.  Assessment & Plan:   Principal Problem:   Severe sepsis ([place]) Active Problems:   Severe protein-calorie malnutrition (HCC)   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HCC)   Acute respiratory failure with hypoxia (HCC)   Chronic gastric ulcer with perforation (HCC)   Desquamative dermatitis   Sepsis  ([place])   Cellulitis of left lower extremity   Hypophosphatemia   Anemia of chronic disease   Localized swelling of right upper extremity   NSVT (nonsustained ventricular tachycardia) (HCC)   History of gastric ulcer   Abnormal loss of weight   Aspiration pneumonia of both lungs (HCC)   Acute on chronic diastolic CHF (congestive heart failure) (HCC)   #1 severe sepsis secondary to discriminative dermatitis with superimposed cellulitis and possible underlying osteomyelitis -Patient noted to have presented with complicated nonhealing desquamative dermatitis involving hands and feet complicated by severe sepsis due to cellulitis/wound infection. -MRI of bilateral hands and feet done without any definite evidence of osteomyelitis. -Patient initially admitted to the critical care service, concern for probable vitamin/nutritional deficiencies versus paraneoplastic syndromes. -Concern for possible nutritional deficiencies, B2, B6, tryptophan, zinc, thiamine etc. could lead to this. -Patient noted to be prior alcoholic now with a chronic pancreatitis on Creon and likely has pre-existing condition of malabsorption as well as possible gastric surgery unsure as to what the patient has had this on not however per PCCM. -Patient placed empirically on IV vancomycin, IV Rocephin and subsequently transition to doxycycline and Augmentin and just completed a 10-day course of antibiotic treatment 03/22/2022.  -Patient noted to go into acute hypoxic respiratory failure with concerns for possible aspiration pneumonia and volume overload and placed empirically on IV Unasyn to treat for total of 10 days. -Continue vitamin D3, vitamin B12, zinc, vitamin D3. -Continue vitamin A, selenium. -Will need follow-up with dermatology (we have followed in [name]) -Will need outpatient follow-up with podiatry for bone biopsy to  evaluate for osteomyelitis. -Outpatient follow-up with ID which has been set  for 03/22/2022. -Patient unable to ambulate seen by PT would likely require SNF placement. -Patient with ongoing pain, causing tachycardia, continue current regimen and IV Dilaudid as needed. -Continue current pain regimen as recommended by palliative care of Dilaudid 2 mg every 2 hours, fentanyl patch 25 mcg increased to 50 mcg and subsequently increased to 100 mcg/h every 72 hours on 03/31/2022.  Patient also noted on T3 and phenobarbital for anxiety at night.   -Patient also placed on Decadron 8 mg IV daily and wean down to 4 mg daily and recommended a long taper over several weeks per palliative care as well as aggressive diuresis and antibiotics with repeat of T3, T4 and cortisol level. -Continue supportive care. -Palliative care following and appreciate input and recommendations.  2.  Acute hypoxic respiratory failure likely secondary to acute on chronic diastolic CHF exacerbation +/- aspiration pneumonia -Patient noted to have a bout of nausea and emesis the evening of 03/22/2022, noted to have increased O2 requirements and required 5 L nasal cannula from being on room air the day prior. -Chest x-ray done with new bilateral patchy airspace opacities possibly due to multifocal infection or pulmonary edema, small bilateral pleural effusions. -BNP noted to be elevated at 3564.6 -Patient on IV albumin every 6 hours. -Patient was placed on IV Lasix and dose uptitrated to 80 mg every 12 hours, patient with good urine output of 3.1 L over the past 24 hours.   -Current weight of 143.52 pounds.  -O2 sats improving currently 97% on RA.  -Strict I's and O's. -Daily weights. -Placed empirically placed on IV antibiotics, of IV Unasyn and will treat for total of 10 days.   -Discontinue IV Unasyn after today's doses.  -  3.  History of chronic alcoholic pancreatitis -Creon.   4.  Hepatic cirrhosis with ascites/transaminitis -Being followed in the outpatient setting by [facility]. -BP soft and as such  diuretics held spironolactone and Lasix held early on during the hospitalization. -.CT Abdomen and Pelvis done and showed "Marked diffuse subcutaneous body wall edema. Mild ascites. The constellation of findings can be seen the setting of anasarca. Punctate free intraperitoneal gas within the upper abdomen adjacent to numerous surgical clips involving the distal stomach. A site of bowel perforation is not clearly identified and there is no leakage of oral contrast. Correlation for an exogenous source of gas, as can be seen with paracentesis, is recommended. Mild hepatic steatosis. Changes in keeping with chronic pancreatitis."  -Patient with no hepatic encephalopathy. -Patient currently on IV Lasix 80 mg every 12 hours due to concerns for volume overload with good diuresis. -Outpatient follow-up with GI.  5.  Gastric chronic ulcer with contained perforation -Stable. -Patient with continued trickling down of hemoglobin, status post transfusion 3 units total of packed red blood cells hemoglobin currently at 9.3 from 10.1. 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   -Hemoglobin seems to be stabilizing.    -Continue PPI twice daily, Pepcid twice daily.  -GI consulted, assessed patient, CT chest abdomen and pelvis obtained per GI which showed multi focal groundglass pulmonary infiltrates new from 03/15/2022 more prevalent within the lung apices bilaterally possibly related to atypical infection in the acute setting, large right and small left pleural effusions, marked diffuse subcutaneous body wall edema  mild ascites can be seen with anasarca.  Punctuate free intraperitoneal gas within the upper abdomen adjacent to numerous surgical clips involving the distal stomach.  A site of bowel perforation is not clearly identified and no  leakage of contrast noted.  Correlate with an exogenous source of gas as can be seen with paracentesis recommended.  Hepatic steatosis.  Changes of chronic pancreatitis.  No definite superimposed acute peripancreatic inflammatory changes noted. -Patient assessed by GI who reviewed CT scans, discussed patient's prior surgeries and feel at this time patient does not need any further endoscopic evaluation and to follow for now. -Follow H&H, transfusion threshold hemoglobin  < 8.   6.  Hypothyroidism -TSH noted at 33.958 on 03/15/2022. -Continue Synthroid 50 mcg's daily.   -Will need repeat labs in 4 to 6 weeks in the outpatient setting.    6.  Hypophosphatemia/hypomagnesemia/hypokalemia -Magnesium at 2.0.  -Phosphorus at 3.4, potassium at 3.8. -Repletion per pharmacy.   -Repeat labs in the AM.    7.  Severe protein calorie malnutrition -Patient seen by dietitian, PICC line placed and patient started on TPN per pharmacy. -Albumin level noted at< 1.5 -IV albumin every 6 hours x 24 hours.   -Will start weaning TPN down as patient with improved appetite and improved calorie count.   -Dietitian following.  8.  Anemia of chronic disease/folate deficiency -Hemoglobin noted to go as low as 6.5, status post transfusion 1 unit packed red blood cells.   Hemoglobin noted at 7.1 on 03/20/2022.  Status posttransfusion 2 units packed red blood cells hemoglobin currently at 8.2 from 10.1 (03/22/2022 ). 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   -Hemoglobin seems to be trickling back down.   -GI consulted for further evaluation and management to see if repeat EGD needed as well.   -CT chest abdomen and pelvis done on 03/24/2022 per GI recommendations with multi focal groundglass pulmonary infiltrates new from 03/15/2022 more prevalent  within the lung apices bilaterally possibly related to atypical infection in the acute setting, large right and small left pleural effusions, marked diffuse subcutaneous body wall edema mild ascites can be seen with anasarca.  Punctuate free intraperitoneal gas within the upper abdomen adjacent to numerous surgical clips involving the distal stomach.  A site of bowel perforation is not clearly identified and no leakage of contrast noted.  Correlate with an exogenous source of gas as can be seen with paracentesis recommended.  Hepatic steatosis.  Changes of chronic pancreatitis.  No definite superimposed acute peripancreatic inflammatory changes noted. -Patient assessed by GI who reviewed CT scans, discussed patient's prior surgeries and feel at this time patient does not need any further endoscopic evaluation and to follow for now. -Follow H&H, transfusion threshold hemoglobin  < 8.   9.  Right upper extremity swelling and pain -Improving clinically. -Upper extremity Dopplers negative for DVT.   -Patient on diuretics.   -Keep right upper extremity elevated.    10.  NSVT -Patient noted to have 7 beat run of NSVT on 03/19/2022, patient was asymptomatic.   -Electrolytes repleted potassium today currently at 3.8, magnesium at 2.0. -Hemoglobin noted at 7.1 on 03/20/2022, status post transfusion 3 units packed red blood cells during this hospitalization hemoglobin currently at 9.3. -Repeat labs in the AM.     DVT prophylaxis: Heparin Code Status: Full Family Communication: Updated  patient.  No family at bedside.  Disposition: SNF.  Status is: Inpatient Remains inpatient appropriate because: Severity of illness.  Unsafe disposition.   Consultants:  PCCM admission. Infectious disease: Dr. Candiss Norse 03/15/2022 Wound care RN 03/14/2022 Palliative care: Dr. Hilma Favors 03/23/2022 Gastroenterology: Dr. Loletha Carrow III 03/23/2022  Procedures:  Plain films of bilateral feet 03/13/2022 Chest x-ray 03/15/2022, 03/26/2022,  03/28/2022, 03/30/2022, 03/31/2022 MRI left foot and right foot 03/13/2022 PICC line placement 03/17/2022 Transfused 2 units packed red blood cells 03/20/2022 Transfuse 1 unit packed red blood cells 03/15/2022 Right upper extremity Dopplers 03/19/2022 CT chest abdomen and pelvis 03/24/2022 Acute abdominal series 03/23/2022  Antimicrobials:  Anti-infectives (From admission, onward)    Start     Dose/Rate Route Frequency Ordered Stop   03/23/22 1215  Ampicillin-Sulbactam (UNASYN) 3 g in sodium chloride 0.9 % 100 mL IVPB        3 g 200 mL/hr over 30 Minutes Intravenous Every 6 hours 03/23/22 1118 04/01/22 2359   03/19/22 2200  amoxicillin-clavulanate (AUGMENTIN) 875-125 MG per tablet 1 tablet        1 tablet Oral Every 12 hours 03/19/22 1115 03/22/22 2231   03/19/22 2200  doxycycline (VIBRA-TABS) tablet 100 mg        100 mg Oral Every 12 hours 03/19/22 1115 03/22/22 2230   03/14/22 2200  vancomycin (VANCOREADY) IVPB 500 mg/100 mL  Status:  Discontinued        500 mg 100 mL/hr over 60 Minutes Intravenous Every 24 hours 03/13/22 2049 03/19/22 1116   03/13/22 1915  vancomycin (VANCOCIN) IVPB 1000 mg/200 mL premix        1,000 mg 200 mL/hr over 60 Minutes Intravenous  Once 03/13/22 1908 03/14/22 0103   03/13/22 1615  cefTRIAXone (ROCEPHIN) 2 g in sodium chloride 0.9 % 100 mL IVPB        2 g 200 mL/hr over 30 Minutes Intravenous Every 24 hours 03/13/22 1603 03/19/22 1959         Subjective: Sitting up in bed.  Shortness of breath is improved.  Denies any chest pain.  No abdominal pain.  Appetite improving stating that she is eating  > 50% of her meals.  Pain controlled per patient.    Objective: Vitals:   03/31/22 1600 03/31/22 2049 04/01/22 0605 04/01/22 1120  BP: (!) 144/86 (!) 144/88 136/87 (!) 133/92  Pulse: 99   96  Resp: 15   14  Temp:  97.8 F (36.6 C) 98.2 F (36.8 C) 98.2 F (36.8 C)  TempSrc:  Oral Oral Oral  SpO2: 96%   98%  Weight:   65.1 kg   Height:        Intake/Output  Summary (Last 24 hours) at 04/01/2022 1156 Last data filed at 04/01/2022 1100 Gross per 24 hour  Intake 1417.22 ml  Output 3575 ml  Net -2157.78 ml    Filed Weights   03/30/22 0500 03/31/22 0629 04/01/22 0605  Weight: 64.8 kg 64.9 kg 65.1 kg    Examination:  General exam: Frail.  Cachectic. Respiratory system: Decreased breath sounds in the bases.  Significantly improved rhonchorous coarse breath sounds.  Decreased crackles.  Fair air movement.  No wheezing.  Speaking in full sentences.  Cardiovascular system: Tachycardia.  No murmurs rubs or gallops.  Trace bilateral lower extremity edema.   Gastrointestinal system: Abdomen is soft, nontender, nondistended, positive bowel sounds.  No rebound.  No guarding.  Central nervous system: Alert and oriented.  Moving extremities spontaneously.  No focal neurological deficits.  Extremities: Bilateral hands, bilateral feet wrapped in gauze/Kerlix.  Right upper extremity with decreased swelling, decreased tenderness to palpation.   Skin: Decreased erythema right upper extremity, severe desquamative dermatitis changes right upper extremity bilateral feet.  Psychiatry: Judgement and insight appear normal. Mood & affect appropriate.     Data Reviewed: I have personally reviewed following labs and imaging studies  CBC: Recent Labs  Lab 03/28/22 0305 03/29/22 0255 03/30/22 0320 03/31/22 0329 04/01/22 1020  WBC 9.7 9.8 11.1* 11.3* 13.9*  NEUTROABS 7.7 8.1* 9.4* 9.7* 10.9*  HGB 8.4* 8.3* 8.2* 8.2* 9.3*  HCT 27.3* 27.0* 26.7* 27.2* 29.9*  MCV 101.9* 100.4* 101.9* 102.3* 100.7*  PLT 240 220 229 239 273     Basic Metabolic Panel: Recent Labs  Lab 03/28/22 0305 03/29/22 0255 03/30/22 0320 03/31/22 0329 04/01/22 0322  NA 142 142 139 141 140  K 3.5 3.5 3.3* 4.1 3.8  CL 99 98 94* 99 100  CO2 34* 34* 32 33* 30  GLUCOSE 120* 136* 141* 89 123*  BUN 66* 64* 52* 52* 51*  CREATININE 0.75 0.72 0.67 0.68 0.71  CALCIUM 8.6* 8.8* 8.7* 9.2 9.3   MG 2.2 2.1 1.8 2.3 2.0  PHOS 3.6 4.2 3.1 4.4 3.4     GFR: Estimated Creatinine Clearance: 71.7 mL/min (by C-G formula based on SCr of 0.71 mg/dL).  Liver Function Tests: Recent Labs  Lab 03/28/22 0305 03/29/22 0255 03/30/22 0320 03/31/22 0329 04/01/22 0322  AST 42* 39 45* 33 34  ALT 36 34 41 33 32  ALKPHOS 76 72 107 86 90  BILITOT 0.7 0.8 0.6 0.5 0.5  PROT 6.0* 5.9* 6.5 5.8* 6.4*  ALBUMIN 3.3* 3.5 3.8 3.6 3.6     CBG: Recent Labs  Lab 03/31/22 1821 03/31/22 2314 04/01/22 0548 04/01/22 0733 04/01/22 1123  GLUCAP 280* 196* 212* 130* 161*      No results found for this or any previous visit (from the past 240 hour(s)).        Radiology Studies: DG CHEST PORT 1 VIEW  Result Date: 03/31/2022 CLINICAL DATA:  Shortness of breath EXAM: PORTABLE CHEST 1 VIEW COMPARISON:  03/30/2022 FINDINGS: Left arm PICC line tip is in the superior cavoatrial junction. Stable cardiomediastinal contours. Decreased volume of left pleural effusion. Posterior layering right pleural effusion is unchanged in the interval. Remote left posterior rib fractures, unchanged. IMPRESSION: 1. Decreased volume of left pleural effusion. 2. Unchanged posterior layering right pleural effusion. Electronically Signed   By: Signa Kell M.D.   On: 03/31/2022 08:13        Scheduled Meds:  (feeding supplement) PROSource Plus  30 mL Oral BID BM   acetaminophen  1,000 mg Oral TID   Chlorhexidine Gluconate Cloth  6 each Topical Daily   cholecalciferol  1,000 Units Oral Daily   dexamethasone (DECADRON) injection  4 mg Intravenous Q24H   enoxaparin (LOVENOX) injection  40 mg Subcutaneous Q24H   famotidine  10 mg Oral BID   feeding supplement  237 mL Oral TID BM   fentaNYL  1 patch Transdermal Q72H   furosemide  80 mg Intravenous Q12H   gabapentin  300 mg Oral TID   insulin aspart  0-20 Units Subcutaneous Q6H   levothyroxine  50 mcg Oral Q0600   liothyronine  25 mcg Oral Daily    lipase/protease/amylase  24,000 Units Oral TID WC   metoprolol tartrate  12.5 mg Oral BID   mupirocin cream   Topical Once   pantoprazole  40 mg  Oral BID   PHENObarbital  32.5 mg Intravenous QHS   pyridOXINE  100 mg Oral Daily   silver sulfADIAZINE   Topical Daily   sodium chloride flush  10-40 mL Intracatheter Q12H   vitamin A  50,000 Units Oral Daily   vitamin B-12  100 mcg Oral Daily   vitamin E  400 Units Oral Daily   Continuous Infusions:  albumin human 12.5 g (04/01/22 0540)   ampicillin-sulbactam (UNASYN) IV 3 g (04/01/22 0649)   TPN ADULT (ION) 80 mL/hr at 03/31/22 1712   TPN ADULT (ION)       LOS: 18 days    Time spent: 40 minutes    Ramiro Harvest, MD [facility]   To contact the attending provider between 7A-7P or the covering provider during after hours 7P-7A, please log into the web site [email or web address] and access using universal [place] password for that web site. If you do not have the password, please call the hospital operator.  04/01/2022, 11:56 AM

## 2022-04-02 ENCOUNTER — Encounter: Payer: Medicare PPO | Admitting: Gastroenterology

## 2022-04-02 DIAGNOSIS — A419 Sepsis, unspecified organism: Secondary | ICD-10-CM | POA: Diagnosis not present

## 2022-04-02 DIAGNOSIS — J690 Pneumonitis due to inhalation of food and vomit: Secondary | ICD-10-CM | POA: Diagnosis not present

## 2022-04-02 DIAGNOSIS — J9601 Acute respiratory failure with hypoxia: Secondary | ICD-10-CM | POA: Diagnosis not present

## 2022-04-02 DIAGNOSIS — I5033 Acute on chronic diastolic (congestive) heart failure: Secondary | ICD-10-CM | POA: Diagnosis not present

## 2022-04-02 LAB — GLUCOSE, CAPILLARY
Glucose-Capillary: 120 mg/dL — ABNORMAL HIGH (ref 70–99)
Glucose-Capillary: 207 mg/dL — ABNORMAL HIGH (ref 70–99)
Glucose-Capillary: 269 mg/dL — ABNORMAL HIGH (ref 70–99)
Glucose-Capillary: 282 mg/dL — ABNORMAL HIGH (ref 70–99)
Glucose-Capillary: 326 mg/dL — ABNORMAL HIGH (ref 70–99)

## 2022-04-02 LAB — RENAL FUNCTION PANEL
Albumin: 4.3 g/dL (ref 3.5–5.0)
Anion gap: 13 (ref 5–15)
BUN: 58 mg/dL — ABNORMAL HIGH (ref 8–23)
CO2: 29 mmol/L (ref 22–32)
Calcium: 9.3 mg/dL (ref 8.9–10.3)
Chloride: 97 mmol/L — ABNORMAL LOW (ref 98–111)
Creatinine, Ser: 0.62 mg/dL (ref 0.44–1.00)
GFR, Estimated: >60 mL/min (ref >60)
Glucose, Bld: 98 mg/dL (ref 70–99)
Phosphorus: 4 mg/dL (ref 2.5–4.6)
Potassium: 3.6 mmol/L (ref 3.5–5.1)
Sodium: 139 mmol/L (ref 135–145)

## 2022-04-02 LAB — BASIC METABOLIC PANEL
Anion gap: 12 (ref 5–15)
BUN: 57 mg/dL — ABNORMAL HIGH (ref 8–23)
CO2: 30 mmol/L (ref 22–32)
Calcium: 9.4 mg/dL (ref 8.9–10.3)
Chloride: 97 mmol/L — ABNORMAL LOW (ref 98–111)
Creatinine, Ser: 0.61 mg/dL (ref 0.44–1.00)
GFR, Estimated: >60 mL/min (ref >60)
Glucose, Bld: 96 mg/dL (ref 70–99)
Potassium: 3.6 mmol/L (ref 3.5–5.1)
Sodium: 139 mmol/L (ref 135–145)

## 2022-04-02 LAB — MAGNESIUM: Magnesium: 2.4 mg/dL (ref 1.7–2.4)

## 2022-04-02 LAB — PHOSPHORUS: Phosphorus: 3.9 mg/dL (ref 2.5–4.6)

## 2022-04-02 LAB — CBC
HCT: 29.9 % — ABNORMAL LOW (ref 36.0–46.0)
Hemoglobin: 9.4 g/dL — ABNORMAL LOW (ref 12.0–15.0)
MCH: 31.1 pg (ref 26.0–34.0)
MCHC: 31.4 g/dL (ref 30.0–36.0)
MCV: 99 fL (ref 80.0–100.0)
Platelets: 271 10*3/uL (ref 150–400)
RBC: 3.02 MIL/uL — ABNORMAL LOW (ref 3.87–5.11)
RDW: 16.4 % — ABNORMAL HIGH (ref 11.5–15.5)
WBC: 16.2 10*3/uL — ABNORMAL HIGH (ref 4.0–10.5)
nRBC: 0 % (ref 0.0–0.2)

## 2022-04-02 MED ORDER — HYDROMORPHONE HCL 2 MG PO TABS
4.0000 mg | ORAL_TABLET | ORAL | Status: DC | PRN
  Administered 2022-04-03 – 2022-04-11 (×5): 4 mg via ORAL
  Filled 2022-04-02 (×5): qty 2

## 2022-04-02 MED ORDER — TRAVASOL 10 % IV SOLN
INTRAVENOUS | Status: AC
  Filled 2022-04-02: qty 499.2

## 2022-04-02 MED ORDER — PHENOBARBITAL 32.4 MG PO TABS
16.2000 mg | ORAL_TABLET | Freq: Every day | ORAL | Status: DC
  Administered 2022-04-02 – 2022-04-11 (×10): 16.2 mg via ORAL
  Filled 2022-04-02 (×11): qty 1

## 2022-04-02 MED ORDER — FENTANYL 100 MCG/HR TD PT72
1.0000 | MEDICATED_PATCH | TRANSDERMAL | Status: DC
  Administered 2022-04-02: 1 via TRANSDERMAL
  Filled 2022-04-02: qty 1

## 2022-04-02 NOTE — TOC Progression Note (Signed)
Transition of Care [facility]) - Progression Note    Patient Details  Name: Carolyn [name] MRN: [id number] Date of Birth: 1955-04-08  [facility]) CM/SW Contact  Henrietta Dine, RN Phone Number: 04/02/2022, 10:52 AM  Clinical Narrative:    Pt not ready for d/c; remains on TPN; TOC will con't to follow.   Expected Discharge Plan: [place] Barriers to Discharge: Continued Medical Work up  Expected Discharge Plan and Services   Discharge Planning Services: CM Consult Post Acute Care Choice: [place] arrangements for the past 2 months: Single Family Home                                       Social Determinants of Health (SDOH) Interventions SDOH Screenings   Food Insecurity: No Food Insecurity (03/15/2022)  Housing: Low Risk  (03/15/2022)  Transportation Needs: No Transportation Needs (03/15/2022)  Utilities: At Risk (03/15/2022)  Depression (PHQ2-9): Low Risk  (07/22/2020)  Tobacco Use: Low Risk  (03/13/2022)    Readmission Risk Interventions     No data to display

## 2022-04-02 NOTE — Progress Notes (Signed)
PHARMACY - TOTAL PARENTERAL NUTRITION CONSULT NOTE   Indication:  chronic severe malnutrition with desquamating rash  Patient Measurements: Height: 5\' 8"  (172.7 cm) Weight: 54.8 kg (120 lb 13 oz) (reweighed patient 3 times) IBW/kg (Calculated) : 63.9 TPN AdjBW (KG): 46 Body mass index is 18.37 kg/m.  Assessment: 67 year old female with severe malnutrition related to chronic illness (including chronic pancreatitis, h/o multiple gastric surgeries including gastric bypass, GERD, Vit D def, know malabsorption issues ). Patient with nonhealing desquamating rash likely related to significant nutritional/vitamin deficiencies. Pharmacy consulted to dose TPN to accelerate improvement in nutritional deficiencies. Initially planning for 7-10 days of TPN.  - 1/16: TPN became disconnected in AM. Hang D10W for the rest of the day.  Glucose / Insulin: no hx diabetes. CBG goal <150. - insulin added to TPN 1/10; requiring adjustment for variable CBGs - CBGs mostly elevated over past 24hr, 18 units SSI required - Dexamethasone 8 mg IV q24h started 1/9, decreased 4mg  q24h on 1/13  Electrolytes: all WNL Renal: SCr WNL, BUN elevated and continues to trend up Hepatic: LFT's WNL.  albumin up to 4.3 - TG 59 stable GI/Nutrition meds: Vit D, Pepcid 10 BID, Creon TIDWC, po PPI/12 hrs, Vit B6, Vit A, Vit B12, Vit E, and Zinc  Intake / Output:  - UOP: 4225  - LBM 1/17 x 1 - No mIVF - Lasix 80mg  IV q12h + Albumin 12.5g q8h, significant volume overload noted (weight 143 lbs > 120 lbs over 24hr ?accuracy) - po: Regular diet 109ml on flowsheet (Prosource 61ml BID, Ensure TID)  GI Imaging: NA GI Surgeries / Procedures: NA  Central access: 1/3 TPN start date: 1/3  Nutritional Goals: Goal TPN rate is 80 mL/hr (provides 100 g of protein and 1954 kcals per day) Ensure provides 350kcal and 16g protein per bottle (receiving TID) Prosource provides 15g protein/100cal per 68ml (receiving BID)  RD  Assessment: Estimated Needs Total Energy Estimated Needs: 1900-2100 Total Protein Estimated Needs: 95-105g Total Fluid Estimated Needs: 2L/day  Current Nutrition:  - Calorie count results noted - Enteral supplements:   Ensure Plus TID (all charted) - each provides 350 kcal and 20 grams of protein  Prosource BID (all charted) - each provides 100 kcals and 15g protein   Plan:  Day #16 TPN - see RD notes re: calorie count. Per discussion with MD, ok to begin weaning 1/18, continue 1/19.  At 1800: Continue TPN at 40 mL/hr - 50g protein, 977 kcal per day Electrolytes in TPN:  Na 150 mEq/L,  K 65 mEq/L (increase),  Ca 5 mEq/L,  Mg 4 mEq/L (decrease),  Phos 0 mmol/L. Max Cl Add standard MVI and trace elements to TPN (provides 60 mcg selenium, adequate per discussion with RD) Add thiamine 237 mg and folic acid 1 mg to TPN Increase 8 units of insulin in TPN. CBGs highly variable. Continue CBG checks q6h + resistant SSI. Monitor TPN labs on Mon/Thurs and PRN.   Peggyann Juba, PharmD, BCPS Pharmacy: [phone] 04/02/22 8:10 AM

## 2022-04-02 NOTE — Progress Notes (Signed)
PROGRESS NOTE    Carolyn [name]  HQI:[id number] DOB: [birth year] DOA: 03/13/2022 PCP: Pcp, No    Chief Complaint  Patient presents with   Skin Problem    Brief Narrative:  HPI This is a 67 year old female, past medical history of chronic pancreatitis, history of prior alcoholism, gastroesophageal reflux, prior vitamin D deficiency documented. She has known malabsorption issues and has been on Creon. She presents with cellulitis and sepsis related to a ongoing rash of the hands and feet. It appears that the rash has been going on for several weeks continued to get worse. As started off with a small blister the blister then ruptured the skin started to dry out crack and flake. She is starting to flake and desquamate portions of the hands and feet. She developed increased pain and swelling in the right hand that developed redness that tracks into the forearm. She presents to the ER with an elevated white count and was treated with antibiotics for concern of sepsis.  Patient admitted to critical care team subsequently transferred to [facility] service on 01/14/2021 after stabilization noted to be in significant pain, palliative care GI consulted. -Patient noted to have been transfused 2 units packed red blood cells given trending hemoglobin. -Patient seen by GI for further evaluation and also by palliative care who have adjusted pain medications patient started on IV Decadron with improvement with pain management. -Patient also noted to go into volume overload, placed on IV Lasix with good diuresis with adjustment in diuretics. -Patient will also with on calorie count which have improved TPN can be weaned down.  Assessment & Plan:   Principal Problem:   Severe sepsis ([place]) Active Problems:   Severe protein-calorie malnutrition (HCC)   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HCC)   Acute respiratory failure with hypoxia (HCC)   Chronic gastric ulcer with perforation (HCC)   Desquamative dermatitis   Sepsis  ([place])   Cellulitis of left lower extremity   Hypophosphatemia   Anemia of chronic disease   Localized swelling of right upper extremity   NSVT (nonsustained ventricular tachycardia) (HCC)   History of gastric ulcer   Abnormal loss of weight   Aspiration pneumonia of both lungs (HCC)   Acute on chronic diastolic CHF (congestive heart failure) (HCC)   #1 severe sepsis secondary to discriminative dermatitis with superimposed cellulitis and possible underlying osteomyelitis -Patient noted to have presented with complicated nonhealing desquamative dermatitis involving hands and feet complicated by severe sepsis due to cellulitis/wound infection. -MRI of bilateral hands and feet done without any definite evidence of osteomyelitis. -Patient initially admitted to the critical care service, concern for probable vitamin/nutritional deficiencies versus paraneoplastic syndromes. -Concern for possible nutritional deficiencies, B2, B6, tryptophan, zinc, thiamine etc. could lead to this. -Patient noted to be prior alcoholic now with a chronic pancreatitis on Creon and likely has pre-existing condition of malabsorption as well as possible gastric surgery unsure as to what the patient has had this on not however per PCCM. -Patient placed empirically on IV vancomycin, IV Rocephin and subsequently transition to doxycycline and Augmentin and just completed a 10-day course of antibiotic treatment 03/22/2022.  -Patient noted to go into acute hypoxic respiratory failure with concerns for possible aspiration pneumonia and volume overload and placed empirically on IV Unasyn to treat for total of 10 days. -Continue vitamin D3, vitamin B12, zinc, vitamin D3. -Continue vitamin A, selenium. -Will need follow-up with dermatology (we have followed in [name]) -Will need outpatient follow-up with podiatry for bone biopsy to  evaluate for osteomyelitis. -Outpatient follow-up with ID was set up for  03/22/2022 however will need to be rescheduled as patient is still hospitalized. -Patient unable to ambulate seen by PT would likely require SNF placement. -Patient with ongoing pain, causing tachycardia, continue current regimen and IV Dilaudid as needed. -Continue current pain regimen as recommended by palliative care of Dilaudid 2 mg every 2 hours, fentanyl patch 25 mcg increased to 50 mcg and subsequently increased to 100 mcg/h every 72 hours on 03/31/2022.  Patient also noted on T3 and phenobarbital for anxiety at night.   -Patient also placed on Decadron 8 mg IV daily and wean down to 4 mg daily and recommended a long taper over several weeks per palliative care as well as aggressive diuresis and antibiotics with repeat of T3, T4 and cortisol level. -Continue supportive care. -Palliative care following and appreciate input and recommendations.  2.  Acute hypoxic respiratory failure likely secondary to acute on chronic diastolic CHF exacerbation +/- aspiration pneumonia -Patient noted to have a bout of nausea and emesis the evening of 03/22/2022, noted to have increased O2 requirements and required 5 L nasal cannula from being on room air the day prior. -Chest x-ray done with new bilateral patchy airspace opacities possibly due to multifocal infection or pulmonary edema, small bilateral pleural effusions. -BNP noted to be elevated at 3564.6 -Patient on IV albumin every 8 hours. -Patient was placed on IV Lasix and dose uptitrated to 80 mg every 12 hours, patient with good urine output of 4.2-5 L over the past 24 hours.   -Current weight of 120.81 pounds.  -O2 sats improving currently 99% on RA.  -Strict I's and O's. -Daily weights. -Status post 10 days IV Unasyn. -  3.  History of chronic alcoholic pancreatitis -Creon.   4.  Hepatic cirrhosis with ascites/transaminitis -Being followed in the outpatient setting by [facility]. -BP soft and as such diuretics held spironolactone and Lasix held  early on during the hospitalization. -.CT Abdomen and Pelvis done and showed "Marked diffuse subcutaneous body wall edema. Mild ascites. The constellation of findings can be seen the setting of anasarca. Punctate free intraperitoneal gas within the upper abdomen adjacent to numerous surgical clips involving the distal stomach. A site of bowel perforation is not clearly identified and there is no leakage of oral contrast. Correlation for an exogenous source of gas, as can be seen with paracentesis, is recommended. Mild hepatic steatosis. Changes in keeping with chronic pancreatitis."  -Patient with no hepatic encephalopathy. -Patient currently on IV Lasix 80 mg every 12 hours due to concerns for volume overload with good diuresis. -As volume status improves will likely transition to oral Lasix and could likely at that time placed on spironolactone. -Outpatient follow-up with GI.  5.  Gastric chronic ulcer with contained perforation -Stable. -Patient with continued trickling down of hemoglobin, status post transfusion 3 units total of packed red blood cells hemoglobin currently at 9.3 from 10.1. 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   -Hemoglobin seems to be stabilizing.    -Continue PPI twice daily, Pepcid twice daily.  -GI consulted, assessed patient, CT chest abdomen and pelvis obtained per GI which showed multi focal groundglass pulmonary infiltrates new from 03/15/2022 more prevalent within the lung apices bilaterally possibly related to atypical infection in the acute setting, large right and small  left pleural effusions, marked diffuse subcutaneous body wall edema mild ascites can be seen with anasarca.  Punctuate free intraperitoneal gas within the upper abdomen adjacent to numerous surgical clips involving the  distal stomach.  A site of bowel perforation is not clearly identified and no leakage of contrast noted.  Correlate with an exogenous source of gas as can be seen with paracentesis recommended.  Hepatic steatosis.  Changes of chronic pancreatitis.  No definite superimposed acute peripancreatic inflammatory changes noted. -Patient assessed by GI who reviewed CT scans, discussed patient's prior surgeries and feel at this time patient does not need any further endoscopic evaluation and to follow for now. -Follow H&H, transfusion threshold hemoglobin  < 8.   6.  Hypothyroidism -TSH noted at 33.958 on 03/15/2022. -Continue Synthroid 50 mcg's daily.   -Will need repeat labs in 3 to 5 weeks in the outpatient setting.    6.  Hypophosphatemia/hypomagnesemia/hypokalemia -Magnesium at 2.4.  -Phosphorus at 3.9, potassium at 3.6. -Repletion per pharmacy.   -Repeat labs in the AM.    7.  Severe protein calorie malnutrition -Patient seen by dietitian, PICC line placed and patient started on TPN per pharmacy. -Albumin level noted at< 1.5 -IV albumin every 6 hours x 24 hours.   -TPN being weaned down as patient with improved appetite and improved calorie count.   -Dietitian following.  8.  Anemia of chronic disease/folate deficiency -Hemoglobin noted to go as low as 6.5, status post transfusion 1 unit packed red blood cells.   Hemoglobin noted at 7.1 on 03/20/2022.  Status posttransfusion 2 units packed red blood cells hemoglobin currently at 8.2 from 10.1 (03/22/2022 ). 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   -Hemoglobin seemed to be trickling back down.   -GI consulted for further evaluation and management to see if repeat EGD needed as well.   -CT chest abdomen and pelvis done on 03/24/2022 per GI recommendations with multi  focal groundglass pulmonary infiltrates new from 03/15/2022 more prevalent within the lung apices bilaterally possibly related to atypical infection in the acute setting, large right and small left pleural effusions, marked diffuse subcutaneous body wall edema mild ascites can be seen with anasarca.  Punctuate free intraperitoneal gas within the upper abdomen adjacent to numerous surgical clips involving the distal stomach.  A site of bowel perforation is not clearly identified and no leakage of contrast noted.  Correlate with an exogenous source of gas as can be seen with paracentesis recommended.  Hepatic steatosis.  Changes of chronic pancreatitis.  No definite superimposed acute peripancreatic inflammatory changes noted. -Patient assessed by GI who reviewed CT scans, discussed patient's prior surgeries and feel at this time patient does not need any further endoscopic evaluation and to follow for now. -Hemoglobin noted to have stabilized at 9.4. -Follow H&H, transfusion threshold hemoglobin  < 8.   9.  Right upper extremity swelling and pain -Improving clinically. -Upper extremity Dopplers negative for DVT.   -Patient on diuretics.   -Keep right upper extremity elevated.    10.  NSVT -Patient noted to have 7 beat run of NSVT on 03/19/2022, patient was asymptomatic.   -Electrolytes repleted potassium today currently at 3.6, magnesium at 2.4. -Hemoglobin noted at 7.1 on 03/20/2022, status post transfusion 3 units packed red blood cells during this hospitalization hemoglobin stable at 9.4.  -Repeat labs  in the AM.     DVT prophylaxis: Heparin Code Status: Full Family Communication: Updated patient.  No family at bedside.  Disposition: SNF.  Status is: Inpatient Remains inpatient appropriate because: Severity of illness.  Unsafe disposition.   Consultants:  PCCM admission. Infectious disease: Dr. Candiss Norse 03/15/2022 Wound care RN 03/14/2022 Palliative care: Dr. Hilma Favors 03/23/2022 Gastroenterology:  Dr. Loletha Carrow III 03/23/2022  Procedures:  Plain films of bilateral feet 03/13/2022 Chest x-ray 03/15/2022, 03/26/2022, 03/28/2022, 03/30/2022, 03/31/2022 MRI left foot and right foot 03/13/2022 PICC line placement 03/17/2022 Transfused 2 units packed red blood cells 03/20/2022 Transfuse 1 unit packed red blood cells 03/15/2022 Right upper extremity Dopplers 03/19/2022 CT chest abdomen and pelvis 03/24/2022 Acute abdominal series 03/23/2022  Antimicrobials:  Anti-infectives (From admission, onward)    Start     Dose/Rate Route Frequency Ordered Stop   03/23/22 1215  Ampicillin-Sulbactam (UNASYN) 3 g in sodium chloride 0.9 % 100 mL IVPB        3 g 200 mL/hr over 30 Minutes Intravenous Every 6 hours 03/23/22 1118 04/01/22 1845   03/19/22 2200  amoxicillin-clavulanate (AUGMENTIN) 875-125 MG per tablet 1 tablet        1 tablet Oral Every 12 hours 03/19/22 1115 03/22/22 2231   03/19/22 2200  doxycycline (VIBRA-TABS) tablet 100 mg        100 mg Oral Every 12 hours 03/19/22 1115 03/22/22 2230   03/14/22 2200  vancomycin (VANCOREADY) IVPB 500 mg/100 mL  Status:  Discontinued        500 mg 100 mL/hr over 60 Minutes Intravenous Every 24 hours 03/13/22 2049 03/19/22 1116   03/13/22 1915  vancomycin (VANCOCIN) IVPB 1000 mg/200 mL premix        1,000 mg 200 mL/hr over 60 Minutes Intravenous  Once 03/13/22 1908 03/14/22 0103   03/13/22 1615  cefTRIAXone (ROCEPHIN) 2 g in sodium chloride 0.9 % 100 mL IVPB        2 g 200 mL/hr over 30 Minutes Intravenous Every 24 hours 03/13/22 1603 03/19/22 1959         Subjective: Patient sitting up in bed.  Overall feeling better.  Shortness of breath is improving.  Denies any chest pain.  Still with good appetite.  Pain currently controlled.  Seems more upbeat.  Objective: Vitals:   04/02/22 0551 04/02/22 0626 04/02/22 0700 04/02/22 1050  BP:      Pulse:   91   Resp:   20   Temp: 98.1 F (36.7 C)   98 F (36.7 C)  TempSrc: Oral   Oral  SpO2:   99%   Weight:  54.8 kg     Height:        Intake/Output Summary (Last 24 hours) at 04/02/2022 1233 Last data filed at 04/02/2022 0957 Gross per 24 hour  Intake 3674.69 ml  Output 5900 ml  Net -2225.31 ml    Filed Weights   03/31/22 0629 04/01/22 0605 04/02/22 0626  Weight: 64.9 kg 65.1 kg 54.8 kg    Examination:  General exam: Cachectic.  Frail.  Respiratory system: Decreased breath sounds in the bases.  No significant rhonchi or significant crackles diffusely.  Fair air movement.  No wheezing.  Speaking in full sentences.  Cardiovascular system: Tachycardia.  No murmurs rubs or gallops.  No lower extremity edema.  Gastrointestinal system: Abdomen is soft, nontender, nondistended, positive bowel sounds.  No rebound.  No guarding.  Central nervous system: Alert and oriented.  Moving extremities spontaneously.  No focal neurological deficits.  Extremities: Bilateral hands, bilateral feet wrapped in gauze/Kerlix.  Right upper extremity with significantly decreased swelling, nontender to palpation.  Skin: Decreased erythema right upper extremity, severe desquamative dermatitis changes right upper extremity bilateral feet.  Psychiatry: Judgement and insight appear normal. Mood & affect appropriate.     Data Reviewed: I have personally reviewed following labs and imaging studies  CBC: Recent Labs  Lab 03/28/22 0305 03/29/22 0255 03/30/22 0320 03/31/22 0329 04/01/22 1020 04/02/22 0429  WBC 9.7 9.8 11.1* 11.3* 13.9* 16.2*  NEUTROABS 7.7 8.1* 9.4* 9.7* 10.9*  --   HGB 8.4* 8.3* 8.2* 8.2* 9.3* 9.4*  HCT 27.3* 27.0* 26.7* 27.2* 29.9* 29.9*  MCV 101.9* 100.4* 101.9* 102.3* 100.7* 99.0  PLT 240 220 229 239 273 271     Basic Metabolic Panel: Recent Labs  Lab 03/29/22 0255 03/30/22 0320 03/31/22 0329 04/01/22 0322 04/02/22 0429  NA 142 139 141 140 139  139  K 3.5 3.3* 4.1 3.8 3.6  3.6  CL 98 94* 99 100 97*  97*  CO2 34* 32 33* 30 30  29   GLUCOSE 136* 141* 89 123* 96  98  BUN 64* 52* 52*  51* 57*  58*  CREATININE 0.72 0.67 0.68 0.71 0.61  0.62  CALCIUM 8.8* 8.7* 9.2 9.3 9.4  9.3  MG 2.1 1.8 2.3 2.0 2.4  PHOS 4.2 3.1 4.4 3.4 3.9  4.0     GFR: Estimated Creatinine Clearance: 61.5 mL/min (by C-G formula based on SCr of 0.61 mg/dL).  Liver Function Tests: Recent Labs  Lab 03/28/22 0305 03/29/22 0255 03/30/22 0320 03/31/22 0329 04/01/22 0322 04/02/22 0429  AST 42* 39 45* 33 34  --   ALT 36 34 41 33 32  --   ALKPHOS 76 72 107 86 90  --   BILITOT 0.7 0.8 0.6 0.5 0.5  --   PROT 6.0* 5.9* 6.5 5.8* 6.4*  --   ALBUMIN 3.3* 3.5 3.8 3.6 3.6 4.3     CBG: Recent Labs  Lab 04/01/22 1808 04/01/22 2104 04/02/22 0035 04/02/22 0550 04/02/22 1137  GLUCAP 212* 246* 207* 120* 282*      No results found for this or any previous visit (from the past 240 hour(s)).        Radiology Studies: No results found.      Scheduled Meds:  (feeding supplement) PROSource Plus  30 mL Oral BID BM   acetaminophen  1,000 mg Oral TID   Chlorhexidine Gluconate Cloth  6 each Topical Daily   cholecalciferol  1,000 Units Oral Daily   dexamethasone (DECADRON) injection  4 mg Intravenous Q24H   enoxaparin (LOVENOX) injection  40 mg Subcutaneous Q24H   famotidine  10 mg Oral BID   feeding supplement  237 mL Oral TID BM   fentaNYL  1 patch Transdermal Q72H   furosemide  80 mg Intravenous Q12H   gabapentin  300 mg Oral TID   insulin aspart  0-20 Units Subcutaneous Q6H   levothyroxine  50 mcg Oral Q0600   liothyronine  25 mcg Oral Daily   lipase/protease/amylase  24,000 Units Oral TID WC   metoprolol tartrate  12.5 mg Oral BID   mupirocin cream   Topical Once   pantoprazole  40 mg Oral BID   phenobarbital  16.2 mg Oral QHS   pyridOXINE  100 mg Oral Daily   silver sulfADIAZINE   Topical Daily   sodium chloride flush  10-40 mL Intracatheter Q12H   vitamin A  50,000 Units Oral  Daily   vitamin B-12  100 mcg Oral Daily   vitamin E  400 Units Oral Daily   Continuous  Infusions:  albumin human 12.5 g (04/02/22 0630)   TPN ADULT (ION) 40 mL/hr at 04/01/22 1728   TPN ADULT (ION)       LOS: 19 days    Time spent: 35 minutes    Irine Seal, MD [facility]   To contact the attending provider between 7A-7P or the covering provider during after hours 7P-7A, please log into the web site [email or web address] and access using universal [place] password for that web site. If you do not have the password, please call the hospital operator.  04/02/2022, 12:33 PM

## 2022-04-02 NOTE — Progress Notes (Signed)
Fentanyl 75 mcg patch removed on 03/31/22 @ 2217.

## 2022-04-02 NOTE — Care Management Important Message (Signed)
Important Message  Patient Details IM Letter given. Name: Carolyn [name] MRN: [id number] Date of Birth: [birth year]   Medicare Important Message Given:  Yes     Kerin Salen 04/02/2022, 1:39 PM

## 2022-04-03 DIAGNOSIS — A419 Sepsis, unspecified organism: Secondary | ICD-10-CM | POA: Diagnosis not present

## 2022-04-03 DIAGNOSIS — J9601 Acute respiratory failure with hypoxia: Secondary | ICD-10-CM | POA: Diagnosis not present

## 2022-04-03 DIAGNOSIS — J690 Pneumonitis due to inhalation of food and vomit: Secondary | ICD-10-CM | POA: Diagnosis not present

## 2022-04-03 DIAGNOSIS — I5033 Acute on chronic diastolic (congestive) heart failure: Secondary | ICD-10-CM | POA: Diagnosis not present

## 2022-04-03 LAB — COMPREHENSIVE METABOLIC PANEL
ALT: 28 U/L (ref 0–44)
AST: 33 U/L (ref 15–41)
Albumin: 4.6 g/dL (ref 3.5–5.0)
Alkaline Phosphatase: 120 U/L (ref 38–126)
Anion gap: 14 (ref 5–15)
BUN: 62 mg/dL — ABNORMAL HIGH (ref 8–23)
CO2: 26 mmol/L (ref 22–32)
Calcium: 9.2 mg/dL (ref 8.9–10.3)
Chloride: 94 mmol/L — ABNORMAL LOW (ref 98–111)
Creatinine, Ser: 0.73 mg/dL (ref 0.44–1.00)
GFR, Estimated: >60 mL/min (ref >60)
Glucose, Bld: 152 mg/dL — ABNORMAL HIGH (ref 70–99)
Potassium: 3.8 mmol/L (ref 3.5–5.1)
Sodium: 134 mmol/L — ABNORMAL LOW (ref 135–145)
Total Bilirubin: 0.5 mg/dL (ref 0.3–1.2)
Total Protein: 6.7 g/dL (ref 6.5–8.1)

## 2022-04-03 LAB — CBC
HCT: 29.4 % — ABNORMAL LOW (ref 36.0–46.0)
Hemoglobin: 9.2 g/dL — ABNORMAL LOW (ref 12.0–15.0)
MCH: 31.3 pg (ref 26.0–34.0)
MCHC: 31.3 g/dL (ref 30.0–36.0)
MCV: 100 fL (ref 80.0–100.0)
Platelets: 286 10*3/uL (ref 150–400)
RBC: 2.94 MIL/uL — ABNORMAL LOW (ref 3.87–5.11)
RDW: 16.3 % — ABNORMAL HIGH (ref 11.5–15.5)
WBC: 16.8 10*3/uL — ABNORMAL HIGH (ref 4.0–10.5)
nRBC: 0 % (ref 0.0–0.2)

## 2022-04-03 LAB — MAGNESIUM: Magnesium: 2.2 mg/dL (ref 1.7–2.4)

## 2022-04-03 LAB — GLUCOSE, CAPILLARY
Glucose-Capillary: 105 mg/dL — ABNORMAL HIGH (ref 70–99)
Glucose-Capillary: 110 mg/dL — ABNORMAL HIGH (ref 70–99)
Glucose-Capillary: 141 mg/dL — ABNORMAL HIGH (ref 70–99)
Glucose-Capillary: 244 mg/dL — ABNORMAL HIGH (ref 70–99)

## 2022-04-03 LAB — PHOSPHORUS: Phosphorus: 4.6 mg/dL (ref 2.5–4.6)

## 2022-04-03 MED ORDER — FUROSEMIDE 10 MG/ML IJ SOLN
60.0000 mg | Freq: Two times a day (BID) | INTRAMUSCULAR | Status: DC
  Administered 2022-04-03 – 2022-04-10 (×14): 60 mg via INTRAVENOUS
  Filled 2022-04-03 (×14): qty 6

## 2022-04-03 MED ORDER — ACETAMINOPHEN 325 MG PO TABS
650.0000 mg | ORAL_TABLET | Freq: Once | ORAL | Status: AC
  Administered 2022-04-03: 650 mg via ORAL

## 2022-04-03 MED ORDER — TRAVASOL 10 % IV SOLN
INTRAVENOUS | Status: AC
  Filled 2022-04-03: qty 499.2

## 2022-04-03 MED ORDER — METOPROLOL TARTRATE 5 MG/5ML IV SOLN
5.0000 mg | Freq: Once | INTRAVENOUS | Status: AC
  Administered 2022-04-03: 5 mg via INTRAVENOUS
  Filled 2022-04-03: qty 5

## 2022-04-03 MED ORDER — METOPROLOL TARTRATE 25 MG PO TABS
25.0000 mg | ORAL_TABLET | Freq: Two times a day (BID) | ORAL | Status: DC
  Administered 2022-04-03 – 2022-04-12 (×18): 25 mg via ORAL
  Filled 2022-04-03 (×19): qty 1

## 2022-04-03 NOTE — Progress Notes (Addendum)
Daily Progress Note   Patient Name: Carolyn [name]       Date: 04/03/2022 DOB: 03-15-1956  Age: 67 y.o. MRN#: [id number] Attending Physician: Eugenie Filler, MD Primary Care Physician: Pcp, No Admit Date: 03/13/2022  Reason for Consultation/Follow-up: Establishing goals of care, Non pain symptom management, and Pain control  Subjective: Carolyn [name] reports feeling hopeful. Her energy is improving and her appetite. She is able to carry on a full conversation, although she gets fatigued easily.She has no complaints today, despite the confusion with her fentanyl patch and dose change. Overall her need for opioid PRNs is down.  Length of Stay: 20  Current Medications: Scheduled Meds:   (feeding supplement) PROSource Plus  30 mL Oral BID BM   acetaminophen  1,000 mg Oral TID   Chlorhexidine Gluconate Cloth  6 each Topical Daily   cholecalciferol  1,000 Units Oral Daily   dexamethasone (DECADRON) injection  4 mg Intravenous Q24H   enoxaparin (LOVENOX) injection  40 mg Subcutaneous Q24H   famotidine  10 mg Oral BID   feeding supplement  237 mL Oral TID BM   fentaNYL  1 patch Transdermal Q72H   furosemide  60 mg Intravenous Q12H   gabapentin  300 mg Oral TID   insulin aspart  0-20 Units Subcutaneous Q6H   levothyroxine  50 mcg Oral Q0600   liothyronine  25 mcg Oral Daily   lipase/protease/amylase  24,000 Units Oral TID WC   metoprolol tartrate  12.5 mg Oral BID   mupirocin cream   Topical Once   pantoprazole  40 mg Oral BID   phenobarbital  16.2 mg Oral QHS   pyridOXINE  100 mg Oral Daily   silver sulfADIAZINE   Topical Daily   sodium chloride flush  10-40 mL Intracatheter Q12H   vitamin A  50,000 Units Oral Daily   vitamin B-12  100 mcg Oral Daily   vitamin E  400 Units Oral Daily     Continuous Infusions:  albumin human 12.5 g (04/03/22 0531)   TPN ADULT (ION) 40 mL/hr at 04/03/22 0240    PRN Meds: acetaminophen, docusate sodium, hydrALAZINE, HYDROmorphone (DILAUDID) injection, HYDROmorphone, ipratropium-albuterol, lip balm, ondansetron, polyethylene glycol, sodium chloride flush, tiZANidine  Physical Exam          Vital Signs: BP 119/77 (BP Location: Right  Arm)   Pulse 86   Temp 98.7 F (37.1 C) (Oral)   Resp 17   Ht 5\' 8"  (1.727 m)   Wt 50.8 kg   LMP 03/10/2009   SpO2 97%   BMI 17.03 kg/m  SpO2: SpO2: 97 % O2 Device: O2 Device: Room Air O2 Flow Rate: O2 Flow Rate (L/min): 2 L/min  Intake/output summary:  Intake/Output Summary (Last 24 hours) at 04/03/2022 0903 Last data filed at 04/03/2022 0654 Gross per 24 hour  Intake 1613.47 ml  Output 3275 ml  Net -1661.53 ml   LBM: Last BM Date : 04/03/22 Baseline Weight: Weight: 46 kg Most recent weight: Weight: 50.8 kg  Swelling in her hands is significantly improved.   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   Abnormal CT of the abdomen 01/19/2022   Chronic gastric ulcer with perforation (HCC) 01/19/2022   Alcoholic cirrhosis of liver without ascites (HCC) 01/18/2022   Occult blood in stools 01/18/2022   Portal hypertensive gastropathy (HCC) 01/18/2022   Anasarca 01/12/2022   Acute respiratory failure with hypoxia (HCC) 01/11/2022   Hypoalbuminemia  01/11/2022   Pleural effusion 01/10/2022   Abdominal distention 01/10/2022   Physical deconditioning 01/09/2022   Pressure injury of skin 01/08/2022   Hypokalemia 01/07/2022   Hypomagnesemia 01/07/2022   AKI (acute kidney injury) (HCC) 01/07/2022   Severe protein-calorie malnutrition (HCC) 01/07/2022   Cellulitis of right foot 01/07/2022   Normocytic anemia 01/07/2022   Osteomyelitis (HCC) 01/06/2022   Intractable nausea and vomiting 06/13/2020   Metabolic acidosis, increased anion gap 06/12/2020   Seizure (HCC) 05/08/2017   Chronic pain syndrome 05/08/2017   Gastroesophageal reflux disease without esophagitis 05/08/2017   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HCC) 05/08/2017   Pancreatic steatorrhea 05/08/2017   Osteoporosis 03/16/2011   Endometriosis    Malabsorption    Chronic pancreatitis (HCC)    Vitamin D deficiency     Palliative Care Assessment & Plan   Patient Profile: 67 yo retired 79 with multiple serious acute and chronic medical problems:   Liver Cirrhosis, 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 She seemed to respond favorably to starting steroids and T3 supplementation, despite not having a clear autoimmune diagnosis-her dermatitis, thyroid dysfunction and skin wounds could indicate an underlying inflammatory condition.  Acute on Chronic Pain secondary to Chronic Pancreatitis and Skin inflammation/Swelling. Titration of Fentanyl Patch- now at 100 mcg, using less PRN hydromorphone, will switch hydromorphone  to oral 4mg  q4 prn for breakthrough pain. Carolyn [name] confirms her pain has always been in her abdomen and related to her pancreas-it is the primary reason she had surgery many years ago to  get relief.  She has never had a celiac plexus block or other interventions, but I would like to explore this as a possibility for her prior to discharge. Will consult IR to evaluate her as a candidate. May reduce her opioid demand. Continue long taper of steroids over several weeks-they are likely improving her pain appetite and energy level. She would also be an excellent candidate for Ketamine Treatment. 2. Thyroid disease Continue both T4 and T3 supplementation with ongoing monitoring. 3. Heart Failure/Volume Overload:  Patient has questions about her cardiac function and her volume overload state. No prior history of heart failure or evaluation in the record. Unclear what her long term diuretic needs will be and it is complicated by her malnutrition. 4. Sepsis/Dermatitis/Purulent Wounds Skin appearance is improving, wound no longer draining, will need ongoing wound care and management Osteomyelitis concern- remains on IV abx 5. Maluntrition: Weaning TPN, doing well  Recommendations/Plan: Continue to titrate fentanyl Patch and work towards reasonable outpatient regimen IR evaluation for Celiac Plexus Block for chronic pancreatitis pain Continue to support holistically  Goals of Care and Additional Recommendations: Her goal is recovery and to regain her functional status and quality of life To live in a more healthy way and allow her body to heal to the degree possible. To avoid skilled nursing facility and to intensive therapy so she can get home as soon as possible.  Code Status: DNR with FULL SCOPE MEDICAL TREATMENT  Prognosis: Given her age and improvement, she has the ability to recover and seems motivated towards regaining her QOL. Her medical problems are complex and will require ongoing health  system navigation and support.  Discharge Planning: I placed an order for CIR, she is very motivated and her goal is to go home. She could benefit from high intensity rehab services for her deconditioning and will likely transition home quickly. Her husband Timmothy Sours is very supportive and her main caregiver.   Care plan was discussed with patient, RN and Dr. Grandville Silos.  Thank you for allowing the Palliative Medicine Team to assist in the care of this patient.  Total Time: 52 minutes  Lane Hacker, DO  Please contact Palliative Medicine Team phone at [phone] for questions and concerns.

## 2022-04-03 NOTE — Progress Notes (Signed)
PHARMACY - TOTAL PARENTERAL NUTRITION CONSULT NOTE   Indication:  chronic severe malnutrition with desquamating rash  Patient Measurements: Height: 5\' 8"  (172.7 cm) Weight: 50.8 kg (111 lb 15.9 oz) IBW/kg (Calculated) : 63.9 TPN AdjBW (KG): 46 Body mass index is 17.03 kg/m.  Assessment: 67 year old female with severe malnutrition related to chronic illness (including chronic pancreatitis, h/o multiple gastric surgeries including gastric bypass, GERD, Vit D def, know malabsorption issues ). Patient with nonhealing desquamating rash likely related to significant nutritional/vitamin deficiencies. Pharmacy consulted to dose TPN to accelerate improvement in nutritional deficiencies. Initially planning for 7-10 days of TPN.  - 1/16: TPN became disconnected in AM. Hang D10W for the rest of the day.  Glucose / Insulin: no hx diabetes. CBG goal <150. - insulin added to TPN 1/10; requiring adjustment for variable CBGs - CBGs mostly elevated over past 24hr, 37 units SSI required - Dexamethasone 8 mg IV q24h started 1/9, decreased 4mg  q24h on 1/13  Electrolytes: Na slightly low at 134, all others are WNL Renal: SCr WNL, BUN elevated and continues to trend up Hepatic: LFT's WNL.  albumin up to 4.6 - TG 59 stable GI/Nutrition meds: Vit D, Pepcid 10 BID, Creon TIDWC, po PPI/12 hrs, Vit B6, Vit A, Vit B12, Vit E, and Zinc  Intake / Output:  - UOP: 5075 mL  - LBM 1/17 x 1 - No mIVF - Lasix 60mg  IV q12h + Albumin 12.5g q8h, significant volume overload noted (weight 143 lbs > 120 lbs over 24hr ?accuracy) - po: Regular diet 720 mL on flowsheet (Prosource 66ml BID, Ensure TID)  GI Imaging: NA GI Surgeries / Procedures: NA  Central access: 1/3 TPN start date: 1/3  Nutritional Goals: Goal TPN rate is 80 mL/hr (provides 100 g of protein and 1954 kcals per day) Ensure provides 350kcal and 16g protein per bottle (receiving TID) Prosource provides 15g protein/100cal per 66ml (receiving BID)  RD  Assessment: Estimated Needs Total Energy Estimated Needs: 1900-2100 Total Protein Estimated Needs: 95-105g Total Fluid Estimated Needs: 2L/day  Current Nutrition:  - Calorie count results noted - Enteral supplements:   Ensure Plus TID (all charted) - each provides 350 kcal and 20 grams of protein  Prosource BID (all charted) - each provides 100 kcals and 15g protein   Plan:  Day #17 TPN - see RD notes re: calorie count. Per discussion with MD, ok to begin weaning 1/18, continue 1/19. - Messaged MD on 1/20. Plan is to continue TPN for today and stop on 1/21   At 1800: Continue TPN at 40 mL/hr - 50g protein, 977 kcal per day Electrolytes in TPN:  Na to 154 mEq/L, (TPN Na max is 154 meq/L)  K 65 mEq/L  Ca 5 mEq/L,  Mg 4 mEq/L   Phos 0 mmol/L. Max Cl Add standard MVI and trace elements to TPN (provides 60 mcg selenium, adequate per discussion with RD) Add thiamine 983 mg and folic acid 1 mg to TPN Increase 12 units of insulin in TPN. CBGs highly variable. Continue CBG checks q6h + resistant SSI. Monitor TPN labs on Mon/Thurs and PRN.    Royetta Asal, PharmD, BCPS 04/03/2022 10:53 AM

## 2022-04-03 NOTE — Progress Notes (Signed)
PROGRESS NOTE    Carolyn [name]  HQI:[id number] DOB: [birth year] DOA: 03/13/2022 PCP: Pcp, No    Chief Complaint  Patient presents with   Skin Problem    Brief Narrative:  HPI This is a 67 year old female, past medical history of chronic pancreatitis, history of prior alcoholism, gastroesophageal reflux, prior vitamin D deficiency documented. She has known malabsorption issues and has been on Creon. She presents with cellulitis and sepsis related to a ongoing rash of the hands and feet. It appears that the rash has been going on for several weeks continued to get worse. As started off with a small blister the blister then ruptured the skin started to dry out crack and flake. She is starting to flake and desquamate portions of the hands and feet. She developed increased pain and swelling in the right hand that developed redness that tracks into the forearm. She presents to the ER with an elevated white count and was treated with antibiotics for concern of sepsis.  Patient admitted to critical care team subsequently transferred to [facility] service on 01/14/2021 after stabilization noted to be in significant pain, palliative care GI consulted. -Patient noted to have been transfused 2 units packed red blood cells given trending hemoglobin. -Patient seen by GI for further evaluation and also by palliative care who have adjusted pain medications patient started on IV Decadron with improvement with pain management. -Patient also noted to go into volume overload, placed on IV Lasix with good diuresis with adjustment in diuretics. -Patient will also with on calorie count which have improved TPN can be weaned down.  Assessment & Plan:   Principal Problem:   Severe sepsis ([place]) Active Problems:   Severe protein-calorie malnutrition (HCC)   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HCC)   Acute respiratory failure with hypoxia (HCC)   Chronic gastric ulcer with perforation (HCC)   Desquamative dermatitis   Sepsis  ([place])   Cellulitis of left lower extremity   Hypophosphatemia   Anemia of chronic disease   Localized swelling of right upper extremity   NSVT (nonsustained ventricular tachycardia) (HCC)   History of gastric ulcer   Abnormal loss of weight   Aspiration pneumonia of both lungs (HCC)   Acute on chronic diastolic CHF (congestive heart failure) (HCC)   #1 severe sepsis secondary to discriminative dermatitis with superimposed cellulitis and possible underlying osteomyelitis -Patient noted to have presented with complicated nonhealing desquamative dermatitis involving hands and feet complicated by severe sepsis due to cellulitis/wound infection. -MRI of bilateral hands and feet done without any definite evidence of osteomyelitis. -Patient initially admitted to the critical care service, concern for probable vitamin/nutritional deficiencies versus paraneoplastic syndromes. -Concern for possible nutritional deficiencies, B2, B6, tryptophan, zinc, thiamine etc. could lead to this. -Patient noted to be prior alcoholic now with a chronic pancreatitis on Creon and likely has pre-existing condition of malabsorption as well as possible gastric surgery unsure as to what the patient has had this on not however per PCCM. -Patient placed empirically on IV vancomycin, IV Rocephin and subsequently transition to doxycycline and Augmentin and just completed a 10-day course of antibiotic treatment 03/22/2022.  -Patient noted to go into acute hypoxic respiratory failure with concerns for possible aspiration pneumonia and volume overload and placed empirically on IV Unasyn to treat for total of 10 days. -Continue vitamin D3, vitamin B12, zinc, vitamin D3. -Continue vitamin A, selenium. -Will need follow-up with dermatology (we have followed in [name]) -Will need outpatient follow-up with podiatry for bone biopsy to  evaluate for osteomyelitis. -Outpatient follow-up with ID was set up for  03/22/2022 however will need to be rescheduled as patient is still hospitalized. -Patient unable to ambulate seen by PT would likely require SNF placement. -Patient with ongoing pain, causing tachycardia, continue current regimen and IV Dilaudid as needed. -Continue current pain regimen as recommended by palliative care of Dilaudid 2 mg every 2 hours, fentanyl patch 25 mcg increased to 50 mcg and subsequently increased to 100 mcg/h every 72 hours on 03/31/2022.  Patient also noted on T3 and phenobarbital for anxiety at night.   -Patient also placed on Decadron 8 mg IV daily and wean down to 4 mg daily and recommended a long taper over several weeks per palliative care as well as aggressive diuresis and antibiotics with repeat of T3, T4 and cortisol level. -Continue supportive care. -Palliative care following and appreciate input and recommendations.  2.  Acute hypoxic respiratory failure likely secondary to acute on chronic diastolic CHF exacerbation +/- aspiration pneumonia -Patient noted to have a bout of nausea and emesis the evening of 03/22/2022, noted to have increased O2 requirements and required 5 L nasal cannula from being on room air the day prior. -Chest x-ray done with new bilateral patchy airspace opacities possibly due to multifocal infection or pulmonary edema, small bilateral pleural effusions. -BNP noted to be elevated at 3564.6 -Patient on IV albumin every 8 hours. -Patient was placed on IV Lasix and dose uptitrated to 80 mg every 12 hours, patient with good urine output of 5 L over the past 24 hours.   -Current weight of 111.99 pounds.  -O2 sats improving currently 99% on RA.  -Decrease Lasix to 60 mg IV every 12 hours and hopefully transition to oral diuretics in the next 1 to 2 days. -Strict I's and O's. -Daily weights. -Status post 10 days IV Unasyn. -  3.  History of chronic alcoholic pancreatitis -Creon.   4.  Hepatic cirrhosis with ascites/transaminitis -Being followed  in the outpatient setting by [facility]. -BP soft and as such diuretics held spironolactone and Lasix held early on during the hospitalization. -.CT Abdomen and Pelvis done and showed "Marked diffuse subcutaneous body wall edema. Mild ascites. The constellation of findings can be seen the setting of anasarca. Punctate free intraperitoneal gas within the upper abdomen adjacent to numerous surgical clips involving the distal stomach. A site of bowel perforation is not clearly identified and there is no leakage of oral contrast. Correlation for an exogenous source of gas, as can be seen with paracentesis, is recommended. Mild hepatic steatosis. Changes in keeping with chronic pancreatitis."  -Patient with no hepatic encephalopathy. -Patient currently on IV Lasix 80 mg every 12 hours due to concerns for volume overload with good diuresis. -Decrease Lasix to 60 mg IV every 12 hours. -As volume status improves will likely transition to oral Lasix and could likely at that time place also on spironolactone. -Outpatient follow-up with GI.  5.  Gastric chronic ulcer with contained perforation -Stable. -Patient with continued trickling down of hemoglobin, status post transfusion 3 units total of packed red blood cells hemoglobin currently at 9.2 from 10.1. 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   -Hemoglobin seems to be stabilizing.    -Continue PPI twice daily, Pepcid twice daily.  -GI consulted, assessed patient, CT chest abdomen and pelvis obtained  per GI which showed multi focal groundglass pulmonary infiltrates new from 03/15/2022 more prevalent within the lung apices bilaterally possibly related to atypical infection in the acute setting, large right and small left pleural effusions, marked diffuse subcutaneous body wall  edema mild ascites can be seen with anasarca.  Punctuate free intraperitoneal gas within the upper abdomen adjacent to numerous surgical clips involving the distal stomach.  A site of bowel perforation is not clearly identified and no leakage of contrast noted.  Correlate with an exogenous source of gas as can be seen with paracentesis recommended.  Hepatic steatosis.  Changes of chronic pancreatitis.  No definite superimposed acute peripancreatic inflammatory changes noted. -Patient assessed by GI who reviewed CT scans, discussed patient's prior surgeries and feel at this time patient does not need any further endoscopic evaluation and to follow for now. -Follow H&H, transfusion threshold hemoglobin  < 8.   6.  Hypothyroidism -TSH noted at 33.958 on 03/15/2022. -Continue Synthroid 50 mcg's daily.   -Will need repeat labs in 3 to 5 weeks in the outpatient setting.    6.  Hypophosphatemia/hypomagnesemia/hypokalemia -Magnesium at 2.2.  -Phosphorus at 4.6, potassium at 3.8. -Repletion per pharmacy.   -Repeat labs in the AM.    7.  Severe protein calorie malnutrition -Patient seen by dietitian, PICC line placed and patient started on TPN per pharmacy. -Albumin level noted at< 1.5 -IV albumin every 6 hours x 24 hours.   -TPN being weaned down as patient with improved appetite and improved calorie count.   -Dietitian following.  8.  Anemia of chronic disease/folate deficiency -Hemoglobin noted to go as low as 6.5, status post transfusion 1 unit packed red blood cells.   Hemoglobin noted at 7.1 on 03/20/2022.  Status posttransfusion 2 units packed red blood cells hemoglobin currently at 9.2. 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   -Hemoglobin seemed to be trickling back down.   -GI consulted for further evaluation  and management to see if repeat EGD needed as well.   -CT chest abdomen and pelvis done on 03/24/2022 per GI recommendations with multi focal groundglass pulmonary infiltrates new from 03/15/2022 more prevalent within the lung apices bilaterally possibly related to atypical infection in the acute setting, large right and small left pleural effusions, marked diffuse subcutaneous body wall edema mild ascites can be seen with anasarca.  Punctuate free intraperitoneal gas within the upper abdomen adjacent to numerous surgical clips involving the distal stomach.  A site of bowel perforation is not clearly identified and no leakage of contrast noted.  Correlate with an exogenous source of gas as can be seen with paracentesis recommended.  Hepatic steatosis.  Changes of chronic pancreatitis.  No definite superimposed acute peripancreatic inflammatory changes noted. -Patient assessed by GI who reviewed CT scans, discussed patient's prior surgeries and feel at this time patient does not need any further endoscopic evaluation and to follow for now. -Hemoglobin noted to have stabilized at 9.2. -Follow H&H, transfusion threshold hemoglobin  < 8.   9.  Right upper extremity swelling and pain -Clinical improvement. -Upper extremity Dopplers negative for DVT.   -Patient on diuretics.   -Keep right upper extremity elevated.    10.  NSVT -Patient noted to have 7 beat run of NSVT on 03/19/2022, patient was asymptomatic.   -Electrolytes repleted potassium today currently at 3.6,  magnesium at 2.4. -Hemoglobin noted at 7.1 on 03/20/2022, status post transfusion 3 units packed red blood cells during this hospitalization hemoglobin stable at 9.4.  -Repeat labs in the AM.     DVT prophylaxis: Heparin Code Status: Full Family Communication: Updated patient.  No family at bedside.  Disposition: SNF.  Status is: Inpatient Remains inpatient appropriate because: Severity of illness.  Unsafe disposition.   Consultants:  PCCM  admission. Infectious disease: Dr. Thedore Mins 03/15/2022 Wound care RN 03/14/2022 Palliative care: Dr. Phillips Odor 03/23/2022 Gastroenterology: Dr. Myrtie Neither III 03/23/2022  Procedures:  Plain films of bilateral feet 03/13/2022 Chest x-ray 03/15/2022, 03/26/2022, 03/28/2022, 03/30/2022, 03/31/2022 MRI left foot and right foot 03/13/2022 PICC line placement 03/17/2022 Transfused 2 units packed red blood cells 03/20/2022 Transfuse 1 unit packed red blood cells 03/15/2022 Right upper extremity Dopplers 03/19/2022 CT chest abdomen and pelvis 03/24/2022 Acute abdominal series 03/23/2022  Antimicrobials:  Anti-infectives (From admission, onward)    Start     Dose/Rate Route Frequency Ordered Stop   03/23/22 1215  Ampicillin-Sulbactam (UNASYN) 3 g in sodium chloride 0.9 % 100 mL IVPB        3 g 200 mL/hr over 30 Minutes Intravenous Every 6 hours 03/23/22 1118 04/03/22 0237   03/19/22 2200  amoxicillin-clavulanate (AUGMENTIN) 875-125 MG per tablet 1 tablet        1 tablet Oral Every 12 hours 03/19/22 1115 03/22/22 2231   03/19/22 2200  doxycycline (VIBRA-TABS) tablet 100 mg        100 mg Oral Every 12 hours 03/19/22 1115 03/22/22 2230   03/14/22 2200  vancomycin (VANCOREADY) IVPB 500 mg/100 mL  Status:  Discontinued        500 mg 100 mL/hr over 60 Minutes Intravenous Every 24 hours 03/13/22 2049 03/19/22 1116   03/13/22 1915  vancomycin (VANCOCIN) IVPB 1000 mg/200 mL premix        1,000 mg 200 mL/hr over 60 Minutes Intravenous  Once 03/13/22 1908 03/14/22 0103   03/13/22 1615  cefTRIAXone (ROCEPHIN) 2 g in sodium chloride 0.9 % 100 mL IVPB        2 g 200 mL/hr over 30 Minutes Intravenous Every 24 hours 03/13/22 1603 03/19/22 1959         Subjective: Sitting up in bed.  Denies any significant shortness of breath.  No chest pain.  No abdominal pain.  Pain currently controlled.  States appetite still improved.  Tolerating greater than 50% of meals per patient and RN.  Objective: Vitals:   04/03/22 0500 04/03/22 0800  04/03/22 0900 04/03/22 1000  BP: 119/77     Pulse: 86 98 89 96  Resp: 17 11 13 13   Temp: 98.7 F (37.1 C)     TempSrc: Oral     SpO2: 97% 100% 100% 100%  Weight: 50.8 kg     Height:        Intake/Output Summary (Last 24 hours) at 04/03/2022 1137 Last data filed at 04/03/2022 1000 Gross per 24 hour  Intake 1613.47 ml  Output 3550 ml  Net -1936.53 ml    Filed Weights   04/01/22 0605 04/02/22 0626 04/03/22 0500  Weight: 65.1 kg 54.8 kg 50.8 kg    Examination:  General exam: Cachectic.  Frail.  Respiratory system: Decreased breath sounds in the bases.  No rhonchi, no crackles, fair air movement.  No wheezing.  Speaking in full sentences.  Gastrointestinal system: Abdomen is soft, nontender, nondistended, positive bowel sounds.  No rebound.  No guarding.  Central nervous system:  Alert and oriented.  Moving extremities spontaneously.  No focal neurological deficits.  Extremities: Bilateral hands, bilateral feet wrapped in gauze/Kerlix.  Right upper extremity with significantly decreased swelling, nontender to palpation.  Skin: Decreased erythema right upper extremity, severe desquamative dermatitis changes right upper extremity bilateral feet.  Psychiatry: Judgement and insight appear normal. Mood & affect appropriate.     Data Reviewed: I have personally reviewed following labs and imaging studies  CBC: Recent Labs  Lab 03/28/22 0305 03/29/22 0255 03/30/22 0320 03/31/22 0329 04/01/22 1020 04/02/22 0429 04/03/22 0229  WBC 9.7 9.8 11.1* 11.3* 13.9* 16.2* 16.8*  NEUTROABS 7.7 8.1* 9.4* 9.7* 10.9*  --   --   HGB 8.4* 8.3* 8.2* 8.2* 9.3* 9.4* 9.2*  HCT 27.3* 27.0* 26.7* 27.2* 29.9* 29.9* 29.4*  MCV 101.9* 100.4* 101.9* 102.3* 100.7* 99.0 100.0  PLT 240 220 229 239 273 271 286     Basic Metabolic Panel: Recent Labs  Lab 03/30/22 0320 03/31/22 0329 04/01/22 0322 04/02/22 0429 04/03/22 0229  NA 139 141 140 139  139 134*  K 3.3* 4.1 3.8 3.6  3.6 3.8  CL 94* 99  100 97*  97* 94*  CO2 32 33* 30 30  29 26   GLUCOSE 141* 89 123* 96  98 152*  BUN 52* 52* 51* 57*  58* 62*  CREATININE 0.67 0.68 0.71 0.61  0.62 0.73  CALCIUM 8.7* 9.2 9.3 9.4  9.3 9.2  MG 1.8 2.3 2.0 2.4 2.2  PHOS 3.1 4.4 3.4 3.9  4.0 4.6     GFR: Estimated Creatinine Clearance: 57 mL/min (by C-G formula based on SCr of 0.73 mg/dL).  Liver Function Tests: Recent Labs  Lab 03/29/22 0255 03/30/22 0320 03/31/22 0329 04/01/22 0322 04/02/22 0429 04/03/22 0229  AST 39 45* 33 34  --  33  ALT 34 41 33 32  --  28  ALKPHOS 72 107 86 90  --  120  BILITOT 0.8 0.6 0.5 0.5  --  0.5  PROT 5.9* 6.5 5.8* 6.4*  --  6.7  ALBUMIN 3.5 3.8 3.6 3.6 4.3 4.6     CBG: Recent Labs  Lab 04/02/22 1137 04/02/22 1725 04/02/22 2253 04/03/22 0531 04/03/22 1123  GLUCAP 282* 269* 326* 110* 105*      No results found for this or any previous visit (from the past 240 hour(s)).        Radiology Studies: No results found.      Scheduled Meds:  (feeding supplement) PROSource Plus  30 mL Oral BID BM   acetaminophen  1,000 mg Oral TID   Chlorhexidine Gluconate Cloth  6 each Topical Daily   cholecalciferol  1,000 Units Oral Daily   dexamethasone (DECADRON) injection  4 mg Intravenous Q24H   enoxaparin (LOVENOX) injection  40 mg Subcutaneous Q24H   famotidine  10 mg Oral BID   feeding supplement  237 mL Oral TID BM   fentaNYL  1 patch Transdermal Q72H   furosemide  60 mg Intravenous Q12H   gabapentin  300 mg Oral TID   insulin aspart  0-20 Units Subcutaneous Q6H   levothyroxine  50 mcg Oral Q0600   liothyronine  25 mcg Oral Daily   lipase/protease/amylase  24,000 Units Oral TID WC   metoprolol tartrate  12.5 mg Oral BID   mupirocin cream   Topical Once   pantoprazole  40 mg Oral BID   phenobarbital  16.2 mg Oral QHS   pyridOXINE  100 mg Oral Daily   silver  sulfADIAZINE   Topical Daily   sodium chloride flush  10-40 mL Intracatheter Q12H   vitamin A  50,000 Units Oral  Daily   vitamin B-12  100 mcg Oral Daily   vitamin E  400 Units Oral Daily   Continuous Infusions:  albumin human 12.5 g (04/03/22 0531)   TPN ADULT (ION) 40 mL/hr at 04/03/22 0240   TPN ADULT (ION)       LOS: 20 days    Time spent: 35 minutes    Irine Seal, MD [facility]   To contact the attending provider between 7A-7P or the covering provider during after hours 7P-7A, please log into the web site [email or web address] and access using universal [place] password for that web site. If you do not have the password, please call the hospital operator.  04/03/2022, 11:37 AM

## 2022-04-03 NOTE — Progress Notes (Signed)
   04/03/22 1636  Assess: MEWS Score  Temp (!) 100.8 F (38.2 C)  BP 135/74  MAP (mmHg) 90  Pulse Rate (!) 129  ECG Heart Rate (!) 130  Resp 19  SpO2 93 %  O2 Device Room Air  Assess: MEWS Score  MEWS Temp 1  MEWS Systolic 0  MEWS Pulse 3  MEWS RR 0  MEWS LOC 0  MEWS Score 4  MEWS Score Color Red  Assess: if the MEWS score is Yellow or Red  Were vital signs taken at a resting state? Yes  Focused Assessment No change from prior assessment  Does the patient meet 2 or more of the SIRS criteria? No  Does the patient have a confirmed or suspected source of infection? Yes  Provider and Rapid Response Notified? Yes (Dr. Grandville Silos notified)  MEWS guidelines implemented *See Row Information* Yes  Treat  MEWS Interventions Other (Comment) (Meds ordered and administered per orders)  Take Vital Signs  Increase Vital Sign Frequency  Red: Q 1hr X 4 then Q 4hr X 4, if remains red, continue Q 4hrs  Escalate  MEWS: Escalate Red: discuss with charge nurse/RN and provider, consider discussing with RRT  Notify: Charge Nurse/RN  Name of Charge Nurse/RN Notified Myriam Jacobson  Date Charge Nurse/RN Notified 04/03/22  Time Charge Nurse/RN Notified 1640  Provider Notification  Provider Name/Title Dr Grandville Silos  Date Provider Notified 04/03/22  Time Provider Notified 1648  Method of Notification Page  Notification Reason Other (Comment) (Red Mews)  Provider response See new orders  Date of Provider Response 04/03/22  Time of Provider Response 1649  Document  Patient Outcome Stabilized after interventions  Assess: SIRS CRITERIA  SIRS Temperature  0  SIRS Pulse 1  SIRS Respirations  0  SIRS WBC 0  SIRS Score Sum  1

## 2022-04-04 DIAGNOSIS — I5033 Acute on chronic diastolic (congestive) heart failure: Secondary | ICD-10-CM | POA: Diagnosis not present

## 2022-04-04 DIAGNOSIS — A419 Sepsis, unspecified organism: Secondary | ICD-10-CM | POA: Diagnosis not present

## 2022-04-04 DIAGNOSIS — J9601 Acute respiratory failure with hypoxia: Secondary | ICD-10-CM | POA: Diagnosis not present

## 2022-04-04 DIAGNOSIS — J690 Pneumonitis due to inhalation of food and vomit: Secondary | ICD-10-CM | POA: Diagnosis not present

## 2022-04-04 LAB — PHOSPHORUS: Phosphorus: 6.1 mg/dL — ABNORMAL HIGH (ref 2.5–4.6)

## 2022-04-04 LAB — BASIC METABOLIC PANEL
Anion gap: 12 (ref 5–15)
BUN: 75 mg/dL — ABNORMAL HIGH (ref 8–23)
CO2: 28 mmol/L (ref 22–32)
Calcium: 9.5 mg/dL (ref 8.9–10.3)
Chloride: 98 mmol/L (ref 98–111)
Creatinine, Ser: 0.74 mg/dL (ref 0.44–1.00)
GFR, Estimated: >60 mL/min (ref >60)
Glucose, Bld: 129 mg/dL — ABNORMAL HIGH (ref 70–99)
Potassium: 3.9 mmol/L (ref 3.5–5.1)
Sodium: 138 mmol/L (ref 135–145)

## 2022-04-04 LAB — GLUCOSE, CAPILLARY
Glucose-Capillary: 121 mg/dL — ABNORMAL HIGH (ref 70–99)
Glucose-Capillary: 108 mg/dL — ABNORMAL HIGH (ref 70–99)
Glucose-Capillary: 162 mg/dL — ABNORMAL HIGH (ref 70–99)

## 2022-04-04 LAB — MAGNESIUM: Magnesium: 2.4 mg/dL (ref 1.7–2.4)

## 2022-04-04 MED ORDER — DM-GUAIFENESIN ER 30-600 MG PO TB12
1.0000 | ORAL_TABLET | Freq: Two times a day (BID) | ORAL | Status: DC
  Administered 2022-04-04 – 2022-04-12 (×17): 1 via ORAL
  Filled 2022-04-04 (×18): qty 1

## 2022-04-04 MED ORDER — HYDROMORPHONE HCL 1 MG/ML IJ SOLN
1.0000 mg | INTRAMUSCULAR | Status: DC | PRN
  Administered 2022-04-04 – 2022-04-10 (×21): 1 mg via INTRAVENOUS
  Filled 2022-04-04 (×22): qty 1

## 2022-04-04 MED ORDER — FENTANYL 50 MCG/HR TD PT72
1.0000 | MEDICATED_PATCH | TRANSDERMAL | Status: DC
  Administered 2022-04-04 – 2022-04-10 (×3): 1 via TRANSDERMAL
  Filled 2022-04-04 (×4): qty 1

## 2022-04-04 MED ORDER — DM-GUAIFENESIN ER 30-600 MG PO TB12: 2.0000 | ORAL_TABLET | Freq: Two times a day (BID) | ORAL | Status: DC

## 2022-04-04 NOTE — Progress Notes (Signed)
PROGRESS NOTE    Carolyn [name]  HQI:[id number] DOB: [birth year] DOA: 03/13/2022 PCP: Pcp, No    Chief Complaint  Patient presents with   Skin Problem    Brief Narrative:  HPI This is a 67 year old female, past medical history of chronic pancreatitis, history of prior alcoholism, gastroesophageal reflux, prior vitamin D deficiency documented. She has known malabsorption issues and has been on Creon. She presents with cellulitis and sepsis related to a ongoing rash of the hands and feet. It appears that the rash has been going on for several weeks continued to get worse. As started off with a small blister the blister then ruptured the skin started to dry out crack and flake. She is starting to flake and desquamate portions of the hands and feet. She developed increased pain and swelling in the right hand that developed redness that tracks into the forearm. She presents to the ER with an elevated white count and was treated with antibiotics for concern of sepsis.  Patient admitted to critical care team subsequently transferred to [facility] service on 01/14/2021 after stabilization noted to be in significant pain, palliative care GI consulted. -Patient noted to have been transfused 2 units packed red blood cells given trending hemoglobin. -Patient seen by GI for further evaluation and also by palliative care who have adjusted pain medications patient started on IV Decadron with improvement with pain management. -Patient also noted to go into volume overload, placed on IV Lasix with good diuresis with adjustment in diuretics. -Patient will also with on calorie count which have improved TPN can be weaned down.  Assessment & Plan:   Principal Problem:   Severe sepsis ([place]) Active Problems:   Severe protein-calorie malnutrition (HCC)   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HCC)   Acute respiratory failure with hypoxia (HCC)   Chronic gastric ulcer with perforation (HCC)   Desquamative dermatitis   Sepsis  ([place])   Cellulitis of left lower extremity   Hypophosphatemia   Anemia of chronic disease   Localized swelling of right upper extremity   NSVT (nonsustained ventricular tachycardia) (HCC)   History of gastric ulcer   Abnormal loss of weight   Aspiration pneumonia of both lungs (HCC)   Acute on chronic diastolic CHF (congestive heart failure) (HCC)   #1 severe sepsis secondary to discriminative dermatitis with superimposed cellulitis and possible underlying osteomyelitis -Patient noted to have presented with complicated nonhealing desquamative dermatitis involving hands and feet complicated by severe sepsis due to cellulitis/wound infection. -MRI of bilateral hands and feet done without any definite evidence of osteomyelitis. -Patient initially admitted to the critical care service, concern for probable vitamin/nutritional deficiencies versus paraneoplastic syndromes. -Concern for possible nutritional deficiencies, B2, B6, tryptophan, zinc, thiamine etc. could lead to this. -Patient noted to be prior alcoholic now with a chronic pancreatitis on Creon and likely has pre-existing condition of malabsorption as well as possible gastric surgery unsure as to what the patient has had this on not however per PCCM. -Patient placed empirically on IV vancomycin, IV Rocephin and subsequently transition to doxycycline and Augmentin and just completed a 10-day course of antibiotic treatment 03/22/2022.  -Patient noted to go into acute hypoxic respiratory failure with concerns for possible aspiration pneumonia and volume overload and placed empirically on IV Unasyn to treat for total of 10 days. -Continue vitamin D3, vitamin B12, zinc, vitamin D3. -Continue vitamin A, selenium. -Will need follow-up with dermatology (we have followed in [name]) -Will need outpatient follow-up with podiatry for bone biopsy to  evaluate for osteomyelitis. -Outpatient follow-up with ID was set up for  03/22/2022 however will need to be rescheduled as patient is still hospitalized. -Patient unable to ambulate seen by PT would likely require SNF placement. -Patient with ongoing pain, causing tachycardia, continue current regimen and IV Dilaudid as needed. -Continue current pain regimen as recommended by palliative care of Dilaudid 2 mg every 2 hours, fentanyl patch 25 mcg increased to 50 mcg and subsequently increased to 100 mcg/h every 72 hours on 03/31/2022.  Patient also noted on T3 and phenobarbital for anxiety at night.   -Patient also placed on Decadron 8 mg IV daily and wean down to 4 mg daily and recommended a long taper over several weeks per palliative care as well as aggressive diuresis and antibiotics with repeat of T3, T4 and cortisol level. -Patient with some drowsiness today, fentanyl patch likely to be decreased to 75 mcg daily per palliative care. -Palliative care managing pain regimen.  -Continue supportive care. -Palliative care following and appreciate input and recommendations.  2.  Acute hypoxic respiratory failure likely secondary to acute on chronic diastolic CHF exacerbation +/- aspiration pneumonia -Patient noted to have a bout of nausea and emesis the evening of 03/22/2022, noted to have increased O2 requirements and required 5 L nasal cannula from being on room air the day prior. -Chest x-ray done with new bilateral patchy airspace opacities possibly due to multifocal infection or pulmonary edema, small bilateral pleural effusions. -BNP noted to be elevated at 3564.6 -Patient on IV albumin every 8 hours. -Patient was placed on IV Lasix and dose uptitrated to 80 mg every 12 hours, patient with good urine output of 5 L over the past 24 hours.   -Current weight of 111.99 pounds.  -O2 sats improving currently 99% on RA.  -Decreased Lasix to 60 mg IV every 12 hours and hopefully transition to oral diuretics in the next 1 to 2 days. -Strict I's and O's. -Daily weights. -Status  post 10 days IV Unasyn. -Place on Mucinex due to coarse breath sounds.  3.  History of chronic alcoholic pancreatitis -Creon.   4.  Hepatic cirrhosis with ascites/transaminitis -Being followed in the outpatient setting by [facility]. -BP soft and as such diuretics held spironolactone and Lasix held early on during the hospitalization. -.CT Abdomen and Pelvis done and showed "Marked diffuse subcutaneous body wall edema. Mild ascites. The constellation of findings can be seen the setting of anasarca. Punctate free intraperitoneal gas within the upper abdomen adjacent to numerous surgical clips involving the distal stomach. A site of bowel perforation is not clearly identified and there is no leakage of oral contrast. Correlation for an exogenous source of gas, as can be seen with paracentesis, is recommended. Mild hepatic steatosis. Changes in keeping with chronic pancreatitis."  -Patient with no hepatic encephalopathy. -Patient currently on IV Lasix 60 mg every 12 hours due to concerns for volume overload with good diuresis. -As volume status improves will likely transition to oral Lasix and could likely at that time place also on spironolactone. -Outpatient follow-up with GI.  5.  Gastric chronic ulcer with contained perforation -Stable. -Patient with continued trickling down of hemoglobin, status post transfusion 3 units total of packed red blood cells hemoglobin currently at 9.2 from 10.1. 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   -Hemoglobin seems to be  stabilizing.    -Continue PPI twice daily, Pepcid twice daily.  -GI consulted, assessed patient, CT chest abdomen and pelvis obtained per GI which showed multi focal groundglass pulmonary infiltrates new from 03/15/2022 more prevalent within the lung apices  bilaterally possibly related to atypical infection in the acute setting, large right and small left pleural effusions, marked diffuse subcutaneous body wall edema mild ascites can be seen with anasarca.  Punctuate free intraperitoneal gas within the upper abdomen adjacent to numerous surgical clips involving the distal stomach.  A site of bowel perforation is not clearly identified and no leakage of contrast noted.  Correlate with an exogenous source of gas as can be seen with paracentesis recommended.  Hepatic steatosis.  Changes of chronic pancreatitis.  No definite superimposed acute peripancreatic inflammatory changes noted. -Patient assessed by GI who reviewed CT scans, discussed patient's prior surgeries and feel at this time patient does not need any further endoscopic evaluation and to follow for now. -Follow H&H, transfusion threshold hemoglobin  < 8.   6.  Hypothyroidism -TSH noted at 33.958 on 03/15/2022. -Continue Synthroid 50 mcg's daily.   -Will need repeat labs in 3 to 5 weeks in the outpatient setting.    6.  Hypophosphatemia/hypomagnesemia/hypokalemia -Magnesium at 2.2.  -Phosphorus at 6.1, potassium at 3.9. -Repletion per pharmacy.   -Repeat labs in the AM.    7.  Severe protein calorie malnutrition -Patient seen by dietitian, PICC line placed and patient started on TPN per pharmacy. -Albumin level noted at< 1.5 -IV albumin every 6 hours x 24 hours.   -TPN being weaned down as patient with improved appetite and improved calorie count.   -Dietitian following.  8.  Anemia of chronic disease/folate deficiency -Hemoglobin noted to go as low as 6.5, status post transfusion 1 unit packed red blood cells.   Hemoglobin noted at 7.1 on 03/20/2022.  Status posttransfusion 2 units packed red blood cells hemoglobin currently at 9.2. -Patient with history of large prepyloric deep cratered ulcer per EGD 01/19/2022 with what was felt to be self-contained fistulous tract communicating with bulb  with moderate portal gastropathy, no varices noted at that time.   -Hemoglobin seemed to be trickling back down.   -GI consulted for further evaluation and management to see if repeat EGD needed as well.   -CT chest abdomen and pelvis done on 03/24/2022 per GI recommendations with multi focal groundglass pulmonary infiltrates new from 03/15/2022 more prevalent within the lung apices bilaterally possibly related to atypical infection in the acute setting, large right and small left pleural effusions, marked diffuse subcutaneous body wall edema mild ascites can be seen with anasarca.  Punctuate free intraperitoneal gas within the upper abdomen adjacent to numerous surgical clips involving the distal stomach.  A site of bowel perforation is not clearly identified and no leakage of contrast noted.  Correlate with an exogenous source of gas as can be seen with paracentesis recommended.  Hepatic steatosis.  Changes of chronic pancreatitis.  No definite superimposed acute peripancreatic inflammatory changes noted. -Patient assessed by GI who reviewed CT scans, discussed patient's prior surgeries and feel at this time patient does not need any further endoscopic evaluation and to follow for now. -Hemoglobin noted to have stabilized at 9.2. -Follow H&H, transfusion threshold hemoglobin  < 8.   9.  Right upper extremity swelling and pain -Improved.   -Upper extremity Dopplers negative for DVT.   -On diuretics.   -Keep extremity elevated.    10.  NSVT -Patient noted to  have 7 beat run of NSVT on 03/19/2022, patient was asymptomatic.   -Electrolytes repleted potassium today currently at 3.9, magnesium at 2.4. -Hemoglobin noted at 7.1 on 03/20/2022, status post transfusion 3 units packed red blood cells during this hospitalization hemoglobin stable at 9.2.  -Repeat labs in the AM.     DVT prophylaxis: Heparin Code Status: Full Family Communication: Updated patient.  No family at bedside.  Disposition:  SNF.  Status is: Inpatient Remains inpatient appropriate because: Severity of illness.  Unsafe disposition.   Consultants:  PCCM admission. Infectious disease: Dr. Candiss Norse 03/15/2022 Wound care RN 03/14/2022 Palliative care: Dr. Hilma Favors 03/23/2022 Gastroenterology: Dr. Loletha Carrow III 03/23/2022  Procedures:  Plain films of bilateral feet 03/13/2022 Chest x-ray 03/15/2022, 03/26/2022, 03/28/2022, 03/30/2022, 03/31/2022 MRI left foot and right foot 03/13/2022 PICC line placement 03/17/2022 Transfused 2 units packed red blood cells 03/20/2022 Transfuse 1 unit packed red blood cells 03/15/2022 Right upper extremity Dopplers 03/19/2022 CT chest abdomen and pelvis 03/24/2022 Acute abdominal series 03/23/2022  Antimicrobials:  Anti-infectives (From admission, onward)    Start     Dose/Rate Route Frequency Ordered Stop   03/23/22 1215  Ampicillin-Sulbactam (UNASYN) 3 g in sodium chloride 0.9 % 100 mL IVPB        3 g 200 mL/hr over 30 Minutes Intravenous Every 6 hours 03/23/22 1118 04/03/22 0237   03/19/22 2200  amoxicillin-clavulanate (AUGMENTIN) 875-125 MG per tablet 1 tablet        1 tablet Oral Every 12 hours 03/19/22 1115 03/22/22 2231   03/19/22 2200  doxycycline (VIBRA-TABS) tablet 100 mg        100 mg Oral Every 12 hours 03/19/22 1115 03/22/22 2230   03/14/22 2200  vancomycin (VANCOREADY) IVPB 500 mg/100 mL  Status:  Discontinued        500 mg 100 mL/hr over 60 Minutes Intravenous Every 24 hours 03/13/22 2049 03/19/22 1116   03/13/22 1915  vancomycin (VANCOCIN) IVPB 1000 mg/200 mL premix        1,000 mg 200 mL/hr over 60 Minutes Intravenous  Once 03/13/22 1908 03/14/22 0103   03/13/22 1615  cefTRIAXone (ROCEPHIN) 2 g in sodium chloride 0.9 % 100 mL IVPB        2 g 200 mL/hr over 30 Minutes Intravenous Every 24 hours 03/13/22 1603 03/19/22 1959         Subjective: Sleeping but arousable.  Patient noted with cough.  Denies any significant shortness of breath.  No chest pain.  No abdominal pain.   States pain currently controlled on the current pain regimen.  Tolerating diet.  Somewhat drowsy this morning however easily arousable.  Objective: Vitals:   04/04/22 0700 04/04/22 0800 04/04/22 0829 04/04/22 1100  BP: 101/65 112/74 129/83 115/65  Pulse: 83 82 96 98  Resp: 16 17 20 16   Temp:   98 F (36.7 C)   TempSrc:   Oral   SpO2: 95% 98% 98% 99%  Weight:      Height:        Intake/Output Summary (Last 24 hours) at 04/04/2022 1209 Last data filed at 04/04/2022 1047 Gross per 24 hour  Intake 2685.55 ml  Output 4350 ml  Net -1664.45 ml    Filed Weights   04/02/22 0626 04/03/22 0500 04/04/22 0353  Weight: 54.8 kg 50.8 kg 48.1 kg    Examination:  General exam: Cachectic.  Frail.  Somewhat drowsy but easily arousable Respiratory system: Some scattered coarse breath sounds.  No crackles.  Fair air movement.  No  wheezing.  Speaking in full sentences.  Gastrointestinal system: Abdomen is soft, nontender, nondistended, positive bowel sounds.  No rebound.  No guarding.  Central nervous system: Alert and oriented.  Moving extremities spontaneously.  No focal neurological deficits.  Extremities: Bilateral hands, bilateral feet wrapped in gauze/Kerlix.  Right upper extremity with significantly decreased swelling, nontender to palpation.  Skin: Decreased erythema right upper extremity, severe desquamative dermatitis changes right upper extremity bilateral feet.  Psychiatry: Judgement and insight appear normal. Mood & affect appropriate.     Data Reviewed: I have personally reviewed following labs and imaging studies  CBC: Recent Labs  Lab 03/29/22 0255 03/30/22 0320 03/31/22 0329 04/01/22 1020 04/02/22 0429 04/03/22 0229  WBC 9.8 11.1* 11.3* 13.9* 16.2* 16.8*  NEUTROABS 8.1* 9.4* 9.7* 10.9*  --   --   HGB 8.3* 8.2* 8.2* 9.3* 9.4* 9.2*  HCT 27.0* 26.7* 27.2* 29.9* 29.9* 29.4*  MCV 100.4* 101.9* 102.3* 100.7* 99.0 100.0  PLT 220 229 239 273 271 286     Basic Metabolic  Panel: Recent Labs  Lab 03/31/22 0329 04/01/22 0322 04/02/22 0429 04/03/22 0229 04/04/22 0500  NA 141 140 139  139 134* 138  K 4.1 3.8 3.6  3.6 3.8 3.9  CL 99 100 97*  97* 94* 98  CO2 33* 30 30  29 26 28   GLUCOSE 89 123* 96  98 152* 129*  BUN 52* 51* 57*  58* 62* 75*  CREATININE 0.68 0.71 0.61  0.62 0.73 0.74  CALCIUM 9.2 9.3 9.4  9.3 9.2 9.5  MG 2.3 2.0 2.4 2.2 2.4  PHOS 4.4 3.4 3.9  4.0 4.6 6.1*     GFR: Estimated Creatinine Clearance: 53.9 mL/min (by C-G formula based on SCr of 0.74 mg/dL).  Liver Function Tests: Recent Labs  Lab 03/29/22 0255 03/30/22 0320 03/31/22 0329 04/01/22 0322 04/02/22 0429 04/03/22 0229  AST 39 45* 33 34  --  33  ALT 34 41 33 32  --  28  ALKPHOS 72 107 86 90  --  120  BILITOT 0.8 0.6 0.5 0.5  --  0.5  PROT 5.9* 6.5 5.8* 6.4*  --  6.7  ALBUMIN 3.5 3.8 3.6 3.6 4.3 4.6     CBG: Recent Labs  Lab 04/03/22 1123 04/03/22 1733 04/03/22 2334 04/04/22 0459 04/04/22 1142  GLUCAP 105* 244* 141* 121* 108*      No results found for this or any previous visit (from the past 240 hour(s)).        Radiology Studies: No results found.      Scheduled Meds:  (feeding supplement) PROSource Plus  30 mL Oral BID BM   acetaminophen  1,000 mg Oral TID   Chlorhexidine Gluconate Cloth  6 each Topical Daily   cholecalciferol  1,000 Units Oral Daily   dexamethasone (DECADRON) injection  4 mg Intravenous Q24H   dextromethorphan-guaiFENesin  1 tablet Oral BID   enoxaparin (LOVENOX) injection  40 mg Subcutaneous Q24H   famotidine  10 mg Oral BID   feeding supplement  237 mL Oral TID BM   fentaNYL  1 patch Transdermal Q72H   furosemide  60 mg Intravenous Q12H   gabapentin  300 mg Oral TID   insulin aspart  0-20 Units Subcutaneous Q6H   levothyroxine  50 mcg Oral Q0600   liothyronine  25 mcg Oral Daily   lipase/protease/amylase  24,000 Units Oral TID WC   metoprolol tartrate  25 mg Oral BID   mupirocin cream   Topical Once  pantoprazole  40 mg Oral BID   phenobarbital  16.2 mg Oral QHS   pyridOXINE  100 mg Oral Daily   silver sulfADIAZINE   Topical Daily   sodium chloride flush  10-40 mL Intracatheter Q12H   vitamin B-12  100 mcg Oral Daily   vitamin E  400 Units Oral Daily   Continuous Infusions:  albumin human 12.5 g (04/04/22 0500)   TPN ADULT (ION) 40 mL/hr at 04/04/22 0223     LOS: 21 days    Time spent: 35 minutes    Irine Seal, MD [facility]   To contact the attending provider between 7A-7P or the covering provider during after hours 7P-7A, please log into the web site [email or web address] and access using universal [place] password for that web site. If you do not have the password, please call the hospital operator.  04/04/2022, 12:09 PM

## 2022-04-04 NOTE — Progress Notes (Signed)
Pt received PO medication earlier, VS signs stable, pt alert and oriented however drowsy and unable to stay focused. Husband at bedside a little bit worried.Paliative team informed, ordered for fentanyl 100 mcg patch to be removed and new one 50 mcg to be placed tonight at 10 pm. Other medications adjusted as well. Will continue to monitor. Pain controled for now well.

## 2022-04-05 ENCOUNTER — Encounter (HOSPITAL_COMMUNITY): Payer: Self-pay | Admitting: Pulmonary Disease

## 2022-04-05 DIAGNOSIS — I5033 Acute on chronic diastolic (congestive) heart failure: Secondary | ICD-10-CM | POA: Diagnosis not present

## 2022-04-05 DIAGNOSIS — A419 Sepsis, unspecified organism: Secondary | ICD-10-CM | POA: Diagnosis not present

## 2022-04-05 DIAGNOSIS — J690 Pneumonitis due to inhalation of food and vomit: Secondary | ICD-10-CM | POA: Diagnosis not present

## 2022-04-05 DIAGNOSIS — J9601 Acute respiratory failure with hypoxia: Secondary | ICD-10-CM | POA: Diagnosis not present

## 2022-04-05 LAB — CBC WITH DIFFERENTIAL/PLATELET
Abs Immature Granulocytes: 0.1 10*3/uL — ABNORMAL HIGH (ref 0.00–0.07)
Basophils Absolute: 0.1 10*3/uL (ref 0.0–0.1)
Basophils Relative: 1 %
Eosinophils Absolute: 0.1 10*3/uL (ref 0.0–0.5)
Eosinophils Relative: 1 %
HCT: 30.6 % — ABNORMAL LOW (ref 36.0–46.0)
Hemoglobin: 9.8 g/dL — ABNORMAL LOW (ref 12.0–15.0)
Immature Granulocytes: 1 %
Lymphocytes Relative: 11 %
Lymphs Abs: 1.2 10*3/uL (ref 0.7–4.0)
MCH: 30.7 pg (ref 26.0–34.0)
MCHC: 32 g/dL (ref 30.0–36.0)
MCV: 95.9 fL (ref 80.0–100.0)
Monocytes Absolute: 0.6 10*3/uL (ref 0.1–1.0)
Monocytes Relative: 5 %
Neutro Abs: 8.9 10*3/uL — ABNORMAL HIGH (ref 1.7–7.7)
Neutrophils Relative %: 81 %
Platelets: 224 10*3/uL (ref 150–400)
RBC: 3.19 MIL/uL — ABNORMAL LOW (ref 3.87–5.11)
RDW: 16.1 % — ABNORMAL HIGH (ref 11.5–15.5)
WBC: 10.9 10*3/uL — ABNORMAL HIGH (ref 4.0–10.5)
nRBC: 0 % (ref 0.0–0.2)

## 2022-04-05 LAB — COMPREHENSIVE METABOLIC PANEL WITH GFR
ALT: 32 U/L (ref 0–44)
AST: 42 U/L — ABNORMAL HIGH (ref 15–41)
Albumin: 4.8 g/dL (ref 3.5–5.0)
Alkaline Phosphatase: 107 U/L (ref 38–126)
Anion gap: 16 — ABNORMAL HIGH (ref 5–15)
BUN: 75 mg/dL — ABNORMAL HIGH (ref 8–23)
CO2: 25 mmol/L (ref 22–32)
Calcium: 9.8 mg/dL (ref 8.9–10.3)
Chloride: 94 mmol/L — ABNORMAL LOW (ref 98–111)
Creatinine, Ser: 0.75 mg/dL (ref 0.44–1.00)
GFR, Estimated: >60 mL/min
Glucose, Bld: 112 mg/dL — ABNORMAL HIGH (ref 70–99)
Potassium: 3.5 mmol/L (ref 3.5–5.1)
Sodium: 135 mmol/L (ref 135–145)
Total Bilirubin: 0.8 mg/dL (ref 0.3–1.2)
Total Protein: 7.9 g/dL (ref 6.5–8.1)

## 2022-04-05 LAB — MAGNESIUM: Magnesium: 2.4 mg/dL (ref 1.7–2.4)

## 2022-04-05 LAB — GLUCOSE, CAPILLARY
Glucose-Capillary: 112 mg/dL — ABNORMAL HIGH (ref 70–99)
Glucose-Capillary: 128 mg/dL — ABNORMAL HIGH (ref 70–99)
Glucose-Capillary: 167 mg/dL — ABNORMAL HIGH (ref 70–99)
Glucose-Capillary: 389 mg/dL — ABNORMAL HIGH (ref 70–99)

## 2022-04-05 LAB — PHOSPHORUS: Phosphorus: 5.8 mg/dL — ABNORMAL HIGH (ref 2.5–4.6)

## 2022-04-05 LAB — GROUP A STREP BY PCR: Group A Strep by PCR: NOT DETECTED

## 2022-04-05 MED ORDER — ALBUMIN HUMAN 25 % IV SOLN
12.5000 g | Freq: Two times a day (BID) | INTRAVENOUS | Status: DC
  Administered 2022-04-05 – 2022-04-09 (×9): 12.5 g via INTRAVENOUS
  Filled 2022-04-05 (×9): qty 50

## 2022-04-05 MED ORDER — FLUCONAZOLE 100 MG PO TABS
200.0000 mg | ORAL_TABLET | Freq: Every day | ORAL | Status: DC
  Administered 2022-04-05 – 2022-04-12 (×8): 200 mg via ORAL
  Filled 2022-04-05 (×8): qty 2

## 2022-04-05 MED ORDER — ADULT MULTIVITAMIN W/MINERALS CH
1.0000 | ORAL_TABLET | Freq: Every day | ORAL | Status: DC
  Administered 2022-04-05 – 2022-04-12 (×8): 1 via ORAL
  Filled 2022-04-05 (×8): qty 1

## 2022-04-05 MED ORDER — SELENIUM 200 MCG PO TABS
100.0000 ug | ORAL_TABLET | Freq: Every day | ORAL | Status: DC
  Administered 2022-04-07 – 2022-04-12 (×6): 100 ug via ORAL
  Filled 2022-04-05 (×7): qty 1

## 2022-04-05 MED ORDER — VITAMIN A 3 MG (10000 UNIT) PO CAPS
10000.0000 [IU] | ORAL_CAPSULE | ORAL | Status: DC
  Administered 2022-04-05 – 2022-04-11 (×4): 10000 [IU] via ORAL
  Filled 2022-04-05 (×4): qty 1

## 2022-04-05 MED ORDER — POTASSIUM CHLORIDE CRYS ER 10 MEQ PO TBCR
40.0000 meq | EXTENDED_RELEASE_TABLET | Freq: Once | ORAL | Status: AC
  Administered 2022-04-05: 40 meq via ORAL
  Filled 2022-04-05: qty 4

## 2022-04-05 NOTE — Progress Notes (Signed)
PT Cancellation Note  Patient Details Name: Carolyn [name] MRN: [id number] DOB: 22-Oct-1955   Cancelled Treatment:    Reason Eval/Treat Not Completed: Other (comment) (lunch). Pt with HOB raised and lunch tray in front of her upon arrival. Pt declines PT, reports wanting to take medicine and feel better. Will continue efforts.    Talbot Grumbling PT, DPT 04/05/22, 3:11 PM

## 2022-04-05 NOTE — Consult Note (Signed)
Chief Complaint: Chronic abdominal pain  Referring Physician(s): Phillips Odor  Supervising Physician: Roanna Banning  Patient Status: [facility] - In-pt  History of Present Illness: Carolyn [name] is a 67 y.o. female with chronic pancreatitis, history of prior alcoholism, gastroesophageal reflux, and malabsorption issues and has been on Creon.  She has chronic abdominal pain from her pancreatitis.  We are asked to perform a celiac plexus block.  Past Medical History:  Diagnosis Date   Alcoholic cirrhosis (HCC)    Chronic pancreatitis (HCC)    Endometriosis    GERD (gastroesophageal reflux disease)    Liver disease    Malabsorption    MALABSORPTION SYNDROME   Osteoporosis 03/2011   t score -2.5   Pancreatitis    due to cyst and tumors due to calcifications   Seizure (HCC)    no seizure disorder   Vitamin D deficiency 2012   VIT D 15    Past Surgical History:  Procedure Laterality Date   APPENDECTOMY     BIOPSY  01/19/2022   Procedure: BIOPSY;  Surgeon: Shellia Cleverly, DO;  Location: [facility];  Service: Gastroenterology;;   CHOLECYSTECTOMY     ESOPHAGOGASTRODUODENOSCOPY (EGD) WITH PROPOFOL N/A 01/19/2022   Procedure: ESOPHAGOGASTRODUODENOSCOPY (EGD) WITH PROPOFOL;  Surgeon: Shellia Cleverly, DO;  Location: [facility];  Service: Gastroenterology;  Laterality: N/A;   FEEDING TUBE RELOCATION  2010   JEJUNOSTOMY FEEDING TUBE  1990   MULTIPLE GASTRIC SURGERIES     MYOMECTOMY     OPEN REDUCTION INTERNAL FIXATION (ORIF) DISTAL RADIAL FRACTURE Left 06/29/2018   Procedure: OPEN REDUCTION INTERNAL FIXATION (ORIF)LEFT  DISTAL RADIAL FRACTURE;  Surgeon: Betha Loa, MD;  Location: [facility];  Service: Orthopedics;  Laterality: Left;   ROUX-EN-Y GASTRIC BYPASS     TUBAL LIGATION      Allergies: Peanut-containing drug products  Medications: Prior to Admission medications   Medication Sig Start Date End Date Taking? Authorizing Provider  buprenorphine  (BUTRANS) 20 MCG/HR PTWK Place 1 patch onto the skin once a week.   Yes [provider]  clobetasol ointment (TEMOVATE) 0.05 % Apply 1 Application topically 2 (two) times daily. APPLY TO AFFECTED AREAS OF HANDS AND FEET   Yes [provider]  famotidine (PEPCID) 10 MG tablet Take 10 mg by mouth 2 (two) times daily.   Yes [provider]  feeding supplement (ENSURE ENLIVE / ENSURE PLUS) LIQD Take 237 mLs by mouth 3 (three) times daily between meals. 01/25/22  Yes Briant Cedar, MD  lidocaine (XYLOCAINE) 2 % solution Use as directed 15 mLs in the mouth or throat every 6 (six) hours as needed for mouth pain. 09/05/20  Yes Cardama, Amadeo Garnet, MD  lipase/protease/amylase (CREON) 12000-38000 units CPEP capsule Take 2 capsules (24,000 Units total) by mouth 3 (three) times daily with meals. 02/11/22  Yes Esterwood, Amy S, PA-C  Multiple Vitamin (MULITIVITAMIN WITH MINERALS) TABS Take 1 tablet by mouth daily.   Yes [provider]  ondansetron (ZOFRAN) 4 MG tablet Take 4 mg by mouth every 8 (eight) hours as needed for nausea or vomiting.   Yes [provider]  pantoprazole (PROTONIX) 40 MG tablet Take 1 tablet (40 mg total) by mouth 2 (two) times daily before a meal. 02/11/22  Yes Esterwood, Amy S, PA-C  polyethylene glycol (MIRALAX / GLYCOLAX) 17 g packet Take 17 g by mouth 2 (two) times daily as needed for mild constipation. 01/25/22  Yes Briant Cedar, MD  potassium  chloride SA (KLOR-CON M) 20 MEQ tablet Take 1 tablet (20 mEq total) by mouth 2 (two) times daily for 2 days. 02/18/22 03/13/22 Yes Tressia Danas, MD  potassium chloride SA (KLOR-CON M) 20 MEQ tablet Take 20 mEq by mouth 2 (two) times daily.   Yes [provider]  silver sulfADIAZINE (SILVADENE) 1 % cream Apply topically 2 (two) times daily. Patient taking differently: Apply 1 Application topically 2 (two) times daily as needed (blister burns). 01/25/22  Yes Briant Cedar, MD  tiZANidine (ZANAFLEX) 2 MG tablet Take 1 tablet (2 mg total) by mouth every 12 (twelve) hours as needed for muscle spasms. 01/25/22  Yes Briant Cedar, MD  vitamin B-12 (CYANOCOBALAMIN) 100 MCG tablet Take 100 mcg by mouth daily.   Yes [provider]  zinc sulfate 220 (50 Zn) MG capsule Take 1 capsule (220 mg total) by mouth daily. 01/09/22  Yes Almon Hercules, MD  furosemide (LASIX) 40 MG tablet TAKE 1 TABLET BY MOUTH EVERY DAY 03/23/22   Tressia Danas, MD  Hyoscyamine Sulfate SL (LEVSIN/SL) 0.125 MG SUBL Place 1 each under the tongue every 6(six) hours as needed. Patient not taking: Reported on 03/14/2022 02/11/22   Esterwood, Amy S, PA-C  oxyCODONE-acetaminophen (PERCOCET/ROXICET) 5-325 MG tablet Take 1 tablet by mouth every 6 (six) hours as needed for severe pain. Patient not taking: Reported on 03/14/2022 02/28/22   Horton, Mayer Masker, MD  triamcinolone cream (KENALOG) 0.1 % Apply 1 Application topically 2 (two) times daily. Only to hands Patient not taking: Reported on 03/14/2022 02/28/22   Horton, Mayer Masker, MD  PROMETHAZINE HCL PO Take 4 mg by mouth.   06/06/11  [provider]  Ranitidine HCl (ZANTAC PO) Take by mouth.    06/06/11  [provider]     Family History  Problem Relation Age of Onset   Cancer Father        BONE   Breast cancer Maternal Grandmother     Social History   Socioeconomic History   Marital status: Single    Spouse name: Not on file   Number of children: Not on file   Years of education: Not on file   Highest education level: Not on file  Occupational History   Not on file  Tobacco Use   Smoking status: Never   Smokeless tobacco: Never  Substance and Sexual Activity   Alcohol use: Yes    Comment: occ ( none in several weeks)   Drug use: No   Sexual activity: Never    Partners: Male  Other Topics Concern   Not on file  Social History Narrative   Not on file   Social Determinants of Health    Financial Resource Strain: Not on file  Food Insecurity: No Food Insecurity (03/15/2022)   Hunger Vital Sign    Worried About Running Out of Food in the Last Year: Never true    Ran Out of Food in the Last Year: Never true  Transportation Needs: No Transportation Needs (03/15/2022)   PRAPARE - Administrator, Civil Service (Medical): No    Lack of Transportation (Non-Medical): No  Physical Activity: Not on file  Stress: Not on file  Social Connections: Not on file     Review of Systems: A 12 point ROS discussed and pertinent positives are indicated in the HPI above.  All other systems are negative.  Review of Systems  Vital Signs: BP 125/71   Pulse 87  Temp 98.3 F (36.8 C) (Oral)   Resp 18   Ht 5\' 8"  (1.727 m)   Wt 105 lb 13.1 oz (48 kg)   LMP 03/10/2009   SpO2 100%   BMI 16.09 kg/m   Physical Exam Vitals reviewed.  Constitutional:      Comments: Very thin  HENT:     Head: Normocephalic and atraumatic.  Eyes:     Extraocular Movements: Extraocular movements intact.  Cardiovascular:     Rate and Rhythm: Normal rate and regular rhythm.  Pulmonary:     Effort: Pulmonary effort is normal. No respiratory distress.     Breath sounds: Normal breath sounds.  Abdominal:     Palpations: Abdomen is soft.  Musculoskeletal:        General: Normal range of motion.     Cervical back: Normal range of motion.  Skin:    General: Skin is warm and dry.  Neurological:     General: No focal deficit present.     Mental Status: She is alert and oriented to person, place, and time.  Psychiatric:        Mood and Affect: Mood normal.        Behavior: Behavior normal.        Thought Content: Thought content normal.        Judgment: Judgment normal.     Imaging: DG CHEST PORT 1 VIEW  Result Date: 03/31/2022 CLINICAL DATA:  Shortness of breath EXAM: PORTABLE CHEST 1 VIEW COMPARISON:  03/30/2022 FINDINGS: Left arm PICC line tip is in the superior cavoatrial junction.  Stable cardiomediastinal contours. Decreased volume of left pleural effusion. Posterior layering right pleural effusion is unchanged in the interval. Remote left posterior rib fractures, unchanged. IMPRESSION: 1. Decreased volume of left pleural effusion. 2. Unchanged posterior layering right pleural effusion. Electronically Signed   By: 04/01/2022 M.D.   On: 03/31/2022 08:13   DG CHEST PORT 1 VIEW  Result Date: 03/30/2022 CLINICAL DATA:  Shortness of breath. EXAM: PORTABLE CHEST 1 VIEW COMPARISON:  03/28/2022 FINDINGS: Left arm PICC line tip is at the superior cavoatrial junction. Stable cardiomediastinal contours. Persistent bilateral pleural effusions, right greater than left. Mild increase interstitial markings concerning for pulmonary edema. No pneumothorax. No acute osseous findings. IMPRESSION: 1. Persistent bilateral pleural effusions, right greater than left. 2. Suspect mild pulmonary edema. Electronically Signed   By: 03/30/2022 M.D.   On: 03/30/2022 06:46   DG CHEST PORT 1 VIEW  Result Date: 03/28/2022 CLINICAL DATA:  Short of breath, dyspnea EXAM: PORTABLE CHEST 1 VIEW COMPARISON:  Prior chest x-ray 03/26/2022 FINDINGS: Stable cardiomegaly. Moderately large layering right pleural effusion. Small left pleural effusion. Associated bibasilar airspace opacities. No pneumothorax. No acute osseous abnormality. IMPRESSION: 1. Right larger than left layering pleural effusions with associated bibasilar atelectasis versus infiltrate. 2. Stable cardiomegaly. 3. No significant interval change in the appearance of the lungs. Electronically Signed   By: 05/25/2022 M.D.   On: 03/28/2022 07:38   DG CHEST PORT 1 VIEW  Result Date: 03/26/2022 CLINICAL DATA:  Shortness of breath. EXAM: PORTABLE CHEST 1 VIEW COMPARISON:  Chest x-ray from abdominal series on 03/23/2022 FINDINGS: Stable heart size. Continued probable underlying moderate congestive heart failure with increased prominence of a right  pleural effusion and stable smaller left pleural effusion. No pneumothorax. IMPRESSION: Continued probable moderate congestive heart failure with increased prominence of a right pleural effusion. Electronically Signed   By: 05/22/2022 M.D.   On: 03/26/2022 07:52  CT CHEST ABDOMEN PELVIS W CONTRAST  Result Date: 03/24/2022 CLINICAL DATA:  Weight loss, unintended EXAM: CT CHEST, ABDOMEN, AND PELVIS WITH CONTRAST TECHNIQUE: Multidetector CT imaging of the chest, abdomen and pelvis was performed following the standard protocol during bolus administration of intravenous contrast. RADIATION DOSE REDUCTION: This exam was performed according to the departmental dose-optimization program which includes automated exposure control, adjustment of the mA and/or kV according to patient size and/or use of iterative reconstruction technique. CONTRAST:  OMNIPAQUE IOHEXOL 300 MG/ML  SOLN COMPARISON:  CT abdomen pelvis 11/03/2012 FINDINGS: CT CHEST FINDINGS Cardiovascular: No significant coronary artery calcification. Global cardiac size within normal limits. No pericardial effusion. Central pulmonary arteries are of normal caliber. Mild atherosclerotic calcification within the thoracic aorta. No aortic aneurysm. Left upper extremity PICC line tip noted at the superior cavoatrial junction. Mediastinum/Nodes: There is diffuse mild infiltration of the mediastinal fat in keeping with edema. No loculated fluid collections are identified, however. Visualized thyroid is unremarkable. No pathologic thoracic adenopathy. Esophagus unremarkable. Lungs/Pleura: Multifocal ground-glass pulmonary infiltrates are present, new from chest radiograph of 03/15/2022, more prevalent within the lung apices bilaterally, possibly related to atypical infection in the acute setting. Large right and small left pleural effusions have developed. Subtotal collapse of the right lower lobe. No pneumothorax. No central obstructing lesion.  Musculoskeletal: Healed bilateral rib fractures are noted. No acute bone abnormality. No lytic or blastic bone lesion. CT ABDOMEN PELVIS FINDINGS Hepatobiliary: Mild hepatic steatosis. No enhancing intrahepatic mass. No intra or extrahepatic biliary ductal dilation. Status post cholecystectomy. Pancreas: The pancreas is markedly atrophic and there are scattered parenchymal calcifications seen throughout the residual pancreas in keeping with changes of chronic pancreatitis. No definite superimposed acute peripancreatic inflammatory changes. No loculated peripancreatic fluid collections are seen. Spleen: Normal in size without focal abnormality. Adrenals/Urinary Tract: Adrenal glands are unremarkable. Kidneys are normal, without renal calculi, focal lesion, or hydronephrosis. Bladder is unremarkable. Stomach/Bowel: Numerous surgical clips are seen adjacent to the gastric antrum. Small bowel anastomotic staple lines are noted within the upper quadrants bilaterally. Adjacent to this region, within the subhepatic space is a small amount of extraluminal gas, best seen on axial image # 63/3 and coronal image # 30/6. The source is not clearly identified and there is no extraluminal extension of oral contrast seen. The stomach, small bowel, and large bowel are otherwise unremarkable. No evidence of obstruction or focal inflammation. The appendix is not visualized and is likely absent. Mild ascites is present. Vascular/Lymphatic: Mild atherosclerotic calcification within the supra celiac aorta. The abdominal vasculature is otherwise unremarkable. No pathologic adenopathy within the abdomen and pelvis. Reproductive: Uterus and bilateral adnexa are unremarkable. Other: There is diffuse severe subcutaneous body wall edema noted. No abdominal wall hernia. Musculoskeletal: No acute bone abnormality. No lytic or blastic bone lesion. IMPRESSION: 1. Multifocal ground-glass pulmonary infiltrates, new from chest radiograph of  03/15/2022, more prevalent within the lung apices bilaterally, possibly related to atypical infection in the acute setting. 2. Large right and small left pleural effusions. Marked diffuse subcutaneous body wall edema. Mild ascites. The constellation of findings can be seen the setting of anasarca. 3. Punctate free intraperitoneal gas within the upper abdomen adjacent to numerous surgical clips involving the distal stomach. A site of bowel perforation is not clearly identified and there is no leakage of oral contrast. Correlation for an exogenous source of gas, as can be seen with paracentesis, is recommended. 4. Mild hepatic steatosis. 5. Changes in keeping with chronic pancreatitis. No definite superimposed  acute peripancreatic inflammatory changes. Aortic Atherosclerosis (ICD10-I70.0). Electronically Signed   By: Helyn NumbersAshesh  Parikh M.D.   On: 03/24/2022 23:35   DG ABD ACUTE 2+V W 1V CHEST  Result Date: 03/23/2022 CLINICAL DATA:  Hypoxia EXAM: DG ABDOMEN ACUTE WITH 1 VIEW CHEST COMPARISON:  Chest x-ray dated March 15, 2022 FINDINGS: Nonobstructive bowel-gas pattern. Hernia mesh of the upper abdomen. Cardiac and mediastinal contours are within normal limits. New bilateral patchy airspace opacities. New small bilateral pleural effusions. IMPRESSION: 1. New bilateral patchy airspace opacities, possibly due to multifocal infection or pulmonary edema. 2. Small bilateral pleural effusions. 3. Nonobstructive bowel-gas pattern. Electronically Signed   By: Allegra LaiLeah  Strickland M.D.   On: 03/23/2022 09:27   VAS US UPPER EXTREMITY VENOUS DUPLEX  Result Date: 03/20/2022 UPPER VENOUS STUDY  Patient Name:  Carolyn [name]  Date of Exam:   03/19/2022 Medical Rec #: [id number]       Accession #:    [id number] Date of Birth: Aug 11, 1955       Patient Gender: F Patient Age:   [age] years [facility] Procedure:      VAS US UPPER EXTREMITY VENOUS DUPLEX Referring Phys: Ramiro HarvestANIEL THOMPSON  --------------------------------------------------------------------------------  Indications: Swelling Risk Factors: None identified. Limitations: Poor ultrasound/tissue interface, bandages and open wound. Comparison Study: No prior studies. Performing Technologist: Chanda BusingGregory Collins RVT  Examination Guidelines: A complete evaluation includes B-mode imaging, spectral Doppler, color Doppler, and power Doppler as needed of all accessible portions of each vessel. Bilateral testing is considered an integral part of a complete examination. Limited examinations for reoccurring indications may be performed as noted.  Right Findings: +----------+------------+---------+-----------+----------+-------+ RIGHT     CompressiblePhasicitySpontaneousPropertiesSummary +----------+------------+---------+-----------+----------+-------+ IJV           Full       Yes       Yes                      +----------+------------+---------+-----------+----------+-------+ Subclavian    Full       Yes       Yes                      +----------+------------+---------+-----------+----------+-------+ Axillary      Full       Yes       Yes                      +----------+------------+---------+-----------+----------+-------+ Brachial      Full       Yes       Yes                      +----------+------------+---------+-----------+----------+-------+ Radial        Full                                          +----------+------------+---------+-----------+----------+-------+ Ulnar         Full                                          +----------+------------+---------+-----------+----------+-------+ Cephalic      Full                                          +----------+------------+---------+-----------+----------+-------+  Basilic       Full                                          +----------+------------+---------+-----------+----------+-------+  Left Findings:  +----------+------------+---------+-----------+----------+-------+ LEFT      CompressiblePhasicitySpontaneousPropertiesSummary +----------+------------+---------+-----------+----------+-------+ Subclavian    Full       Yes       Yes                      +----------+------------+---------+-----------+----------+-------+  Summary:  Right: No evidence of deep vein thrombosis in the upper extremity. No evidence of superficial vein thrombosis in the upper extremity.  Left: No evidence of thrombosis in the subclavian.  *See table(s) above for measurements and observations.  Diagnosing physician: Orlie Pollen Electronically signed by Orlie Pollen on 03/20/2022 at 1:13:44 PM.    Final    [place] EKG SITE RITE  Result Date: 03/16/2022 If Site Rite image not attached, placement could not be confirmed due to current cardiac rhythm.  DG Chest Port 1 View  Result Date: 03/15/2022 CLINICAL DATA:  Sepsis. EXAM: PORTABLE CHEST 1 VIEW COMPARISON:  January 17, 2022 FINDINGS: The heart size and mediastinal contours are within normal limits. Low lung volumes are noted. Both lungs are clear. Chronic sixth and seventh left rib fractures are seen. A chronic ninth rib deformity is also noted. IMPRESSION: Low lung volumes without evidence of acute cardiopulmonary disease. Electronically Signed   By: Virgina Norfolk M.D.   On: 03/15/2022 04:01   MR FOOT RIGHT WO CONTRAST  Result Date: 03/13/2022 CLINICAL DATA:  Diabetes. Osteomyelitis suspected. Foot swelling. Soft tissue infection suspected. EXAM: MRI OF THE RIGHT FOREFOOT WITHOUT CONTRAST TECHNIQUE: Multiplanar, multisequence MR imaging of the right foot radiographs 03/13/2022 was performed. No intravenous contrast was administered. COMPARISON:  Right foot radiographs 03/13/2022, 02/28/2022, 01/06/2022; MRI right forefoot FINDINGS: Bones/Joint/Cartilage There is again decreased T1 increased T2 signal marrow edema within the distal aspect of the distal phalanx of the  great toe. Again no definitive soft tissue ulceration is seen, and recommend clinical correlation for signs of adjacent soft tissue infection. Inhomogeneous fat saturation limits evaluation of the phalanges. There is again possible decreased T1 and increased T2/STIR signal within the middle and distal phalanges of the fifth toe, however this may be artifactual. Mild apparent marrow edema within the distal phalanx of the second toe may be slightly increased from prior (sagittal STIR series 9, image 16). Question thinning of the overlying distal second toe skin. Moderate to severe great toe interphalangeal joint space narrowing and peripheral osteophytosis with unchanged valgus angulation. Ligaments The Lisfranc ligament complex is intact. The collateral ligaments appear intact. Muscles and Tendons Redemonstration of diffuse plantar and dorsal muscle edema, not significantly changed. Soft tissues No definite soft tissue ulceration is seen. There is again mild forefoot plantar and dorsal subcutaneous fat edema and swelling, greatest within the mid to distal aspect of the metatarsals and similar to 01/07/2022 prior MRI. IMPRESSION: Compared to 01/07/2022: 1. Marrow edema within the distal aspect of the distal phalanx of the great toe appears unchanged. Again no definitive soft tissue ulceration is seen, and recommend clinical correlation for signs of adjacent soft tissue infection that would raise concern for this edema representing osteomyelitis versus stress related change. 2. Apparent mildly increased marrow edema within the distal phalanx of the second toe compared to 01/07/2022. Question thinning of the  overlying distal second toe skin. Recommend clinical correlation for concern for soft tissue ulcer/infection in this region which would raise concern for distal phalanx osteomyelitis. 3. There is again possible decreased T1 and increased T2 signal within the middle and distal phalanges of the fifth toe, however  again this may be artifactual. Electronically Signed   By: Neita Garnet M.D.   On: 03/13/2022 19:52   MR FOOT LEFT WO CONTRAST  Result Date: 03/13/2022 CLINICAL DATA:  Diabetes. Foot swelling. Osteomyelitis suspected. Soft tissue infection suspected. EXAM: MRI OF THE LEFT FOOT WITHOUT CONTRAST TECHNIQUE: Multiplanar, multisequence MR imaging of the left forefoot was performed. No intravenous contrast was administered. COMPARISON:  Left foot radiographs 03/13/2022, 02/28/2022, 01/07/2022, 03/02/2021 FINDINGS: Bones/Joint/Cartilage Note is made that an acute, intra-articular, mildly displaced fracture was seen on prior 03/02/2021 radiographs within the medial base of the proximal phalanx of the great toe. This appears to have at least partially healed with unchanged mild displacement on numerous prior radiographs including radiographs today, 03/13/2022. Nevertheless, there is high-grade marrow edema seen throughout the proximal 80% of the great toe, and there appear to be multiple acute to subacute fracture lines. There is additional marrow edema and apparent cortical step-off/possible acute to subacute fracture within the distal 50% of the great toe distal phalanx (sagittal series 8, image 11 and coronal series 7, image 10). Recommend clinical correlation for whether there is any concern for infection within the distal great toe that would be suspicious for great toe distal phalanx osteomyelitis. There is also marrow edema throughout the second toe distal phalanx (sagittal series 8, image 17) and the distal tip of the distal phalanx of the third toe (sagittal series 8, image 21). Recommend additional clinical correlation for any concern for skin ulceration and infection in these regions. No definitive soft tissue ulcer is seen on MRI. There is moderate marrow edema within the distal aspect of the cuboid and mild patchy marrow edema throughout the proximal, mid, and distal aspect of the fifth metatarsal. This  may be secondary to degenerative changes and/or stress related changes. Severe tarsometatarsal cartilage degenerative changes with full-thickness cartilage loss diffusely, greatest within the fourth and fifth tarsometatarsal joints at the articulation with the cuboid. Ligaments The Lisfranc ligament complex is intact. The plantar plates appear intact. Muscles and Tendons Minimal flexor tenosynovitis diffusely at the level of the metatarsal shafts. Mild edema is seen throughout the plantar and dorsal musculature, nonspecific myositis. Soft tissues There is mild edema and soft tissue swelling greatest within the dorsal medial aspect of the forefoot, at the level of the great toe metatarsal through phalanges, and within the plantar midfoot. No definite soft tissue ulcer is seen. No abscess or other fluid collection is seen. IMPRESSION: 1. There was an acute fracture within the medial base of the proximal phalanx of the great toe on 03/02/2021 radiographs that appears to have healed over time on subsequent radiographs, however on the current MRI there is high-grade marrow edema throughout the great toe proximal phalanx with apparent subacute fracture lines. Question incomplete healing versus repeat trauma. 2. Additional marrow edema and apparent cortical step-off/possible acute to subacute fracture within the distal 50% of the great toe distal phalanx. Recommend clinical correlation for whether there is any concern for infection within the distal great toe that would be suspicious for great toe distal phalanx osteomyelitis. 3. There is also marrow edema throughout the second toe distal phalanx and the distal tip of the distal phalanx of the third toe. Recommend clinical  correlation for whether there are any toe soft tissue ulcerations that would raise concern for acute osteomyelitis. Electronically Signed   By: Yvonne Kendall M.D.   On: 03/13/2022 19:24   DG Foot Complete Right  Result Date: 03/13/2022 CLINICAL  DATA:  Skin peeling over both feet. EXAM: RIGHT FOOT COMPLETE - 3 VIEW COMPARISON:  Right foot radiographs 02/28/2022 FINDINGS: No acute osseous abnormality is present. Chronic flexion deformity present at the great toe. Progressive soft tissue swelling is present over the dorsum of the foot near the MTP joints. No associated osseous abnormality is present. No gas is present in the soft tissues. IMPRESSION: 1. Progressive soft tissue swelling over the dorsum of the foot near the MTP joints. Findings are nonspecific. This may represent edema or cellulitis. 2. No acute osseous abnormality. Electronically Signed   By: San Morelle M.D.   On: 03/13/2022 10:46   DG Foot Complete Left  Result Date: 03/13/2022 CLINICAL DATA:  Peeling skin over the feet bilaterally. EXAM: LEFT FOOT - COMPLETE 3+ VIEW COMPARISON:  Left foot radiographs 02/28/2022. FINDINGS: Soft tissue swelling is now noted over the dorsum of the foot. Bandage is in place. No acute or focal osseous abnormalities are present. Remote fracture at the base of the proximal phalanx in the great toe is stable. Mild osteopenia is again noted. IMPRESSION: 1. New soft tissue swelling over the dorsum of the foot consistent with edema or cellulitis. 2. No acute osseous abnormalities or significant change. Electronically Signed   By: San Morelle M.D.   On: 03/13/2022 10:43   DG Ankle Complete Right  Result Date: 03/10/2022 Please see detailed radiograph report in office note.   Labs:  CBC: Recent Labs    04/01/22 1020 04/02/22 0429 04/03/22 0229 04/05/22 0357  WBC 13.9* 16.2* 16.8* 10.9*  HGB 9.3* 9.4* 9.2* 9.8*  HCT 29.9* 29.9* 29.4* 30.6*  PLT 273 271 286 224    COAGS: Recent Labs    01/18/22 2044 03/23/22 1451  INR 1.4* 1.3*    BMP: Recent Labs    04/02/22 0429 04/03/22 0229 04/04/22 0500 04/05/22 0357  NA 139  139 134* 138 135  K 3.6  3.6 3.8 3.9 3.5  CL 97*  97* 94* 98 94*  CO2 30  29 26 28 25    GLUCOSE 96  98 152* 129* 112*  BUN 57*  58* 62* 75* 75*  CALCIUM 9.4  9.3 9.2 9.5 9.8  CREATININE 0.61  0.62 0.73 0.74 0.75  GFRNONAA >60  >60 >60 >60 >60    LIVER FUNCTION TESTS: Recent Labs    03/31/22 0329 04/01/22 0322 04/02/22 0429 04/03/22 0229 04/05/22 0357  BILITOT 0.5 0.5  --  0.5 0.8  AST 33 34  --  33 42*  ALT 33 32  --  28 32  ALKPHOS 86 90  --  120 107  PROT 5.8* 6.4*  --  6.7 7.9  ALBUMIN 3.6 3.6 4.3 4.6 4.8    TUMOR MARKERS: No results for input(s): "AFPTM", "CEA", "CA199", "CHROMGRNA" in the last 8760 hours.  Assessment and Plan:  Chronic abdominal pain.  Will proceed with image guided celiac plexus block tomorrow by Dr. Maryelizabeth Kaufmann.  Risks and benefits discussed with the patient including bleeding, infection, damage to adjacent structures, bowel perforation and minimal pain relief.  All of the patient's questions were answered, patient is agreeable to proceed. Consent signed and in chart.  Thank you for allowing our service to participate in Carolyn [name] 's  care.  Electronically Signed: Gwynneth Macleod, PA-C   04/05/2022, 4:35 PM      I spent a total of 40 Minutes in face to face in clinical consultation, greater than 50% of which was counseling/coordinating care for celiac plexus block.

## 2022-04-05 NOTE — Progress Notes (Signed)
PROGRESS NOTE    Carolyn [name]  HQI:[id number] DOB: [birth year] DOA: 03/13/2022 PCP: Pcp, No    Chief Complaint  Patient presents with   Skin Problem    Brief Narrative:  HPI This is a 67 year old female, past medical history of chronic pancreatitis, history of prior alcoholism, gastroesophageal reflux, prior vitamin D deficiency documented. She has known malabsorption issues and has been on Creon. She presents with cellulitis and sepsis related to a ongoing rash of the hands and feet. It appears that the rash has been going on for several weeks continued to get worse. As started off with a small blister the blister then ruptured the skin started to dry out crack and flake. She is starting to flake and desquamate portions of the hands and feet. She developed increased pain and swelling in the right hand that developed redness that tracks into the forearm. She presents to the ER with an elevated white count and was treated with antibiotics for concern of sepsis.  Patient admitted to critical care team subsequently transferred to [facility] service on 01/14/2021 after stabilization noted to be in significant pain, palliative care GI consulted. -Patient noted to have been transfused 2 units packed red blood cells given trending hemoglobin. -Patient seen by GI for further evaluation and also by palliative care who have adjusted pain medications patient started on IV Decadron with improvement with pain management. -Patient also noted to go into volume overload, placed on IV Lasix with good diuresis with adjustment in diuretics. -Patient will also with on calorie count which have improved TPN can be weaned down.  Assessment & Plan:   Principal Problem:   Severe sepsis ([place]) Active Problems:   Severe protein-calorie malnutrition (HCC)   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HCC)   Acute respiratory failure with hypoxia (HCC)   Chronic gastric ulcer with perforation (HCC)   Desquamative dermatitis   Sepsis  ([place])   Cellulitis of left lower extremity   Hypophosphatemia   Anemia of chronic disease   Localized swelling of right upper extremity   NSVT (nonsustained ventricular tachycardia) (HCC)   History of gastric ulcer   Abnormal loss of weight   Aspiration pneumonia of both lungs (HCC)   Acute on chronic diastolic CHF (congestive heart failure) (HCC)   #1 severe sepsis secondary to discriminative dermatitis with superimposed cellulitis and possible underlying osteomyelitis -Patient noted to have presented with complicated nonhealing desquamative dermatitis involving hands and feet complicated by severe sepsis due to cellulitis/wound infection. -MRI of bilateral hands and feet done without any definite evidence of osteomyelitis. -Patient initially admitted to the critical care service, concern for probable vitamin/nutritional deficiencies versus paraneoplastic syndromes. -Concern for possible nutritional deficiencies, B2, B6, tryptophan, zinc, thiamine etc. could lead to this. -Patient noted to be prior alcoholic now with a chronic pancreatitis on Creon and likely has pre-existing condition of malabsorption as well as possible gastric surgery unsure as to what the patient has had this on not however per PCCM. -Patient placed empirically on IV vancomycin, IV Rocephin and subsequently transition to doxycycline and Augmentin and just completed a 10-day course of antibiotic treatment 03/22/2022.  -Patient noted to go into acute hypoxic respiratory failure with concerns for possible aspiration pneumonia and volume overload and placed empirically on IV Unasyn to treat for total of 10 days. -Continue vitamin D3, vitamin B12, zinc, vitamin D3. -Continue vitamin A, selenium. -Will need follow-up with dermatology (we have followed in [name]) -Will need outpatient follow-up with podiatry for bone biopsy to  evaluate for osteomyelitis. -Outpatient follow-up with ID was set up for  03/22/2022 however will need to be rescheduled as patient is still hospitalized. -Patient unable to ambulate seen by PT would likely require SNF placement. -Patient with ongoing pain, causing tachycardia, continue current regimen and IV Dilaudid as needed. -Continue current pain regimen as recommended by palliative care of Dilaudid 2 mg every 2 hours, fentanyl patch 25 mcg increased to 50 mcg and subsequently increased to 100 mcg/h every 72 hours on 03/31/2022.  Patient also noted on T3 and phenobarbital for anxiety at night.   -Due to drowsiness Fentanyl patch decreased to 50 mcg daily on 04/04/2022 per palliative care. -Patient also placed on Decadron 8 mg IV daily and wean down to 4 mg daily and recommended a long taper over several weeks per palliative care as well as aggressive diuresis and antibiotics with repeat of T3, T4 and cortisol level. -Patient being referred for celiac block by palliative care per IR to be done hopefully in the next day. -Palliative care managing pain regimen.  -Continue supportive care. -Palliative care following and appreciate input and recommendations.  2.  Acute hypoxic respiratory failure likely secondary to acute on chronic diastolic CHF exacerbation +/- aspiration pneumonia -Patient noted to have a bout of nausea and emesis the evening of 03/22/2022, noted to have increased O2 requirements and required 5 L nasal cannula from being on room air the day prior. -Chest x-ray done with new bilateral patchy airspace opacities possibly due to multifocal infection or pulmonary edema, small bilateral pleural effusions. -BNP noted to be elevated at 3564.6 -Change IV albumin to every 12 hours.  -Patient was placed on IV Lasix and dose uptitrated to 80 mg every 12 hours, patient with good urine output of 5 L over the past 24 hours.   -Current weight of 105.82 pounds.  -O2 sats improving currently 99% on RA.  -Decreased Lasix to 60 mg IV every 12 hours and hopefully transition to  oral diuretics in the next 2-3 days. -Strict I's and O's. -Daily weights. -Status post 10 days IV Unasyn. -Continue Mucinex due to coarse breath sounds.   -Check a respiratory viral panel.    3.  History of chronic alcoholic pancreatitis -Creon.   4.  Hepatic cirrhosis with ascites/transaminitis -Being followed in the outpatient setting by [facility]. -BP soft and as such diuretics held spironolactone and Lasix held early on during the hospitalization. -.CT Abdomen and Pelvis done and showed "Marked diffuse subcutaneous body wall edema. Mild ascites. The constellation of findings can be seen the setting of anasarca. Punctate free intraperitoneal gas within the upper abdomen adjacent to numerous surgical clips involving the distal stomach. A site of bowel perforation is not clearly identified and there is no leakage of oral contrast. Correlation for an exogenous source of gas, as can be seen with paracentesis, is recommended. Mild hepatic steatosis. Changes in keeping with chronic pancreatitis."  -Patient with no hepatic encephalopathy. -Patient currently on IV Lasix 60 mg every 12 hours due to concerns for volume overload with good diuresis. -As volume status improves will likely transition to oral Lasix and could likely at that time place also on spironolactone. -Outpatient follow-up with GI.  5.  Gastric chronic ulcer with contained perforation -Stable. -Patient with continued trickling down of hemoglobin, status post transfusion 3 units total of packed red blood cells hemoglobin currently at 9.2 from 10.1. 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   -Hemoglobin seems to be stabilizing.    -Continue PPI twice daily, Pepcid twice daily.  -GI consulted, assessed patient, CT chest abdomen and pelvis obtained per  GI which showed multi focal groundglass pulmonary infiltrates new from 03/15/2022 more prevalent within the lung apices bilaterally possibly related to atypical infection in the acute setting, large right and small left pleural effusions, marked diffuse subcutaneous body wall edema mild ascites can be seen with anasarca.  Punctuate free intraperitoneal gas within the upper abdomen adjacent to numerous surgical clips involving the distal stomach.  A site of bowel perforation is not clearly identified and no leakage of contrast noted.  Correlate with an exogenous source of gas as can be seen with paracentesis recommended.  Hepatic steatosis.  Changes of chronic pancreatitis.  No definite superimposed acute peripancreatic inflammatory changes noted. -Patient assessed by GI who reviewed CT scans, discussed patient's prior surgeries and feel at this time patient does not need any further endoscopic evaluation and to follow for now. -Follow H&H, transfusion threshold hemoglobin  < 8.   6.  Hypothyroidism -TSH noted at 33.958 on 03/15/2022. -Continue Synthroid 50 mcg's daily.   -Will need repeat labs in 2-4 weeks in the outpatient setting.    6.  Hypophosphatemia/hypomagnesemia/hypokalemia -Magnesium at 2.4.  -Phosphorus at 5.8, potassium at 3.5. -Repeat labs in the AM.    7.  Severe protein calorie malnutrition -Patient seen by dietitian, PICC line placed and patient started on TPN per pharmacy. -Albumin level noted at< 1.5 -IV albumin every 6 hours x 24 hours.   -TPN has been weaned off.   -Placed on oral selenium, multivitamin, vitamin B6 100 mg daily, vitamin EE 400 units daily, vitamin A 5000 units daily.   -Dietitian following.  8.  Anemia of chronic disease/folate deficiency -Hemoglobin noted to go as low as 6.5, status post transfusion 1 unit packed red blood cells.   Hemoglobin noted at 7.1 on 03/20/2022.  Status posttransfusion 2 units packed red blood cells hemoglobin currently at  9.2. 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   -Hemoglobin seemed to be trickling back down.   -GI consulted for further evaluation and management to see if repeat EGD needed as well.   -CT chest abdomen and pelvis done on 03/24/2022 per GI recommendations with multi focal groundglass pulmonary infiltrates new from 03/15/2022 more prevalent within the lung apices bilaterally possibly related to atypical infection in the acute setting, large right and small left pleural effusions, marked diffuse subcutaneous body wall edema mild ascites can be seen with anasarca.  Punctuate free intraperitoneal gas within the upper abdomen adjacent to numerous surgical clips involving the distal stomach.  A site of bowel perforation is not clearly identified and no leakage of contrast noted.  Correlate with an exogenous source of gas as can be seen with paracentesis recommended.  Hepatic steatosis.  Changes of chronic pancreatitis.  No definite superimposed acute peripancreatic inflammatory changes noted. -Patient assessed by GI who reviewed CT scans, discussed patient's prior surgeries and feel at this time patient does not need any further endoscopic evaluation and to follow for now. -Hemoglobin noted to have stabilized at 9.8. -Follow H&H, transfusion threshold hemoglobin  < 8.   9.  Right upper extremity  swelling and pain -Improved with upper extremity elevation and on diuretics.   -Upper extremity Dopplers negative for DVT.    10.  NSVT -Patient noted to have 7 beat run of NSVT on 03/19/2022, patient was asymptomatic.   -Electrolytes repleted potassium today currently at 3.9, magnesium at 2.4. -Hemoglobin noted at 7.1 on 03/20/2022, status post transfusion 3 units packed red blood cells during this hospitalization hemoglobin stable at  9.8.  -Repeat labs in the AM.     DVT prophylaxis: Heparin Code Status: Full Family Communication: Updated patient.  No family at bedside.  Disposition: SNF.  Status is: Inpatient Remains inpatient appropriate because: Severity of illness.  Unsafe disposition.   Consultants:  PCCM admission. Infectious disease: Dr. Candiss Norse 03/15/2022 Wound care RN 03/14/2022 Palliative care: Dr. Hilma Favors 03/23/2022 Gastroenterology: Dr. Loletha Carrow III 03/23/2022  Procedures:  Plain films of bilateral feet 03/13/2022 Chest x-ray 03/15/2022, 03/26/2022, 03/28/2022, 03/30/2022, 03/31/2022 MRI left foot and right foot 03/13/2022 PICC line placement 03/17/2022 Transfused 2 units packed red blood cells 03/20/2022 Transfuse 1 unit packed red blood cells 03/15/2022 Right upper extremity Dopplers 03/19/2022 CT chest abdomen and pelvis 03/24/2022 Acute abdominal series 03/23/2022  Antimicrobials:  Anti-infectives (From admission, onward)    Start     Dose/Rate Route Frequency Ordered Stop   04/05/22 1200  fluconazole (DIFLUCAN) tablet 200 mg        200 mg Oral Daily 04/05/22 1109 04/19/22 0959   03/23/22 1215  Ampicillin-Sulbactam (UNASYN) 3 g in sodium chloride 0.9 % 100 mL IVPB        3 g 200 mL/hr over 30 Minutes Intravenous Every 6 hours 03/23/22 1118 04/03/22 0237   03/19/22 2200  amoxicillin-clavulanate (AUGMENTIN) 875-125 MG per tablet 1 tablet        1 tablet Oral Every 12 hours 03/19/22 1115 03/22/22 2231   03/19/22 2200  doxycycline (VIBRA-TABS) tablet 100 mg        100 mg Oral Every 12 hours 03/19/22 1115 03/22/22 2230   03/14/22 2200  vancomycin (VANCOREADY) IVPB 500 mg/100 mL  Status:  Discontinued        500 mg 100 mL/hr over 60 Minutes Intravenous Every 24 hours 03/13/22 2049 03/19/22 1116   03/13/22 1915  vancomycin (VANCOCIN) IVPB 1000 mg/200 mL premix        1,000 mg 200 mL/hr over 60 Minutes Intravenous  Once 03/13/22 1908 03/14/22 0103   03/13/22 1615  cefTRIAXone (ROCEPHIN) 2 g in sodium chloride 0.9 %  100 mL IVPB        2 g 200 mL/hr over 30 Minutes Intravenous Every 24 hours 03/13/22 1603 03/19/22 1959         Subjective: Cough. No sig SOB. No CP.  Sleeping but arousable.  Patient noted with cough.  Denies any significant shortness of breath.  No chest pain.  More alert today.   No abdominal pain.  States pain currently controlled on the current pain regimen.  Tolerating diet.  Somewhat drowsy this morning however easily arousable.  Objective: Vitals:   04/05/22 0500 04/05/22 0608 04/05/22 0950 04/05/22 1225  BP:  (!) 149/96 125/71   Pulse:  86 87   Resp:  18    Temp:  98.3 F (36.8 C)  98.3 F (36.8 C)  TempSrc:  Oral  Oral  SpO2:  100%    Weight: 48 kg     Height:        Intake/Output Summary (Last 24 hours) at 04/05/2022 1237 Last data filed at 04/05/2022  1228 Gross per 24 hour  Intake 1670.26 ml  Output 4100 ml  Net -2429.74 ml    Filed Weights   04/03/22 0500 04/04/22 0353 04/05/22 0500  Weight: 50.8 kg 48.1 kg 48 kg    Examination:  General exam: Cachectic.  Frail.  Somewhat drowsy but easily arousable Respiratory system: Some diffuse scattered coarse breath sounds.  Some scattered coarse breath sounds.  No crackles.  Fair air movement.  No wheezing.  Speaking in full sentences.  Gastrointestinal system: Abdomen is soft, nontender, nondistended, positive bowel sounds.  No rebound.  No guarding.  Central nervous system: Alert and oriented.  Moving extremities spontaneously.  No focal neurological deficits.  Extremities: Bilateral hands, bilateral feet wrapped in gauze/Kerlix.  Right upper extremity with significantly decreased swelling, nontender to palpation.  Skin: Decreased erythema right upper extremity, severe desquamative dermatitis changes right upper extremity bilateral feet.  Psychiatry: Judgement and insight appear normal. Mood & affect appropriate.     Data Reviewed: I have personally reviewed following labs and imaging studies  CBC: Recent  Labs  Lab 03/30/22 0320 03/31/22 0329 04/01/22 1020 04/02/22 0429 04/03/22 0229 04/05/22 0357  WBC 11.1* 11.3* 13.9* 16.2* 16.8* 10.9*  NEUTROABS 9.4* 9.7* 10.9*  --   --  8.9*  HGB 8.2* 8.2* 9.3* 9.4* 9.2* 9.8*  HCT 26.7* 27.2* 29.9* 29.9* 29.4* 30.6*  MCV 101.9* 102.3* 100.7* 99.0 100.0 95.9  PLT 229 239 273 271 286 224     Basic Metabolic Panel: Recent Labs  Lab 04/01/22 0322 04/02/22 0429 04/03/22 0229 04/04/22 0500 04/05/22 0357  NA 140 139  139 134* 138 135  K 3.8 3.6  3.6 3.8 3.9 3.5  CL 100 97*  97* 94* 98 94*  CO2 30 30  29 26 28 25   GLUCOSE 123* 96  98 152* 129* 112*  BUN 51* 57*  58* 62* 75* 75*  CREATININE 0.71 0.61  0.62 0.73 0.74 0.75  CALCIUM 9.3 9.4  9.3 9.2 9.5 9.8  MG 2.0 2.4 2.2 2.4 2.4  PHOS 3.4 3.9  4.0 4.6 6.1* 5.8*     GFR: Estimated Creatinine Clearance: 53.8 mL/min (by C-G formula based on SCr of 0.75 mg/dL).  Liver Function Tests: Recent Labs  Lab 03/30/22 0320 03/31/22 0329 04/01/22 0322 04/02/22 0429 04/03/22 0229 04/05/22 0357  AST 45* 33 34  --  33 42*  ALT 41 33 32  --  28 32  ALKPHOS 107 86 90  --  120 107  BILITOT 0.6 0.5 0.5  --  0.5 0.8  PROT 6.5 5.8* 6.4*  --  6.7 7.9  ALBUMIN 3.8 3.6 3.6 4.3 4.6 4.8     CBG: Recent Labs  Lab 04/04/22 1142 04/04/22 1726 04/05/22 0023 04/05/22 0536 04/05/22 1204  GLUCAP 108* 162* 167* 128* 112*      No results found for this or any previous visit (from the past 240 hour(s)).        Radiology Studies: No results found.      Scheduled Meds:  (feeding supplement) PROSource Plus  30 mL Oral BID BM   acetaminophen  1,000 mg Oral TID   Chlorhexidine Gluconate Cloth  6 each Topical Daily   cholecalciferol  1,000 Units Oral Daily   dexamethasone (DECADRON) injection  4 mg Intravenous Q24H   dextromethorphan-guaiFENesin  1 tablet Oral BID   enoxaparin (LOVENOX) injection  40 mg Subcutaneous Q24H   famotidine  10 mg Oral BID   feeding supplement  237 mL  Oral TID BM   fentaNYL  1 patch Transdermal Q72H   fluconazole  200 mg Oral Daily   furosemide  60 mg Intravenous Q12H   gabapentin  300 mg Oral TID   insulin aspart  0-20 Units Subcutaneous Q6H   levothyroxine  50 mcg Oral Q0600   liothyronine  25 mcg Oral Daily   lipase/protease/amylase  24,000 Units Oral TID WC   metoprolol tartrate  25 mg Oral BID   multivitamin with minerals  1 tablet Oral Daily   mupirocin cream   Topical Once   pantoprazole  40 mg Oral BID   phenobarbital  16.2 mg Oral QHS   pyridOXINE  100 mg Oral Daily   silver sulfADIAZINE   Topical Daily   sodium chloride flush  10-40 mL Intracatheter Q12H   vitamin B-12  100 mcg Oral Daily   vitamin E  400 Units Oral Daily   Continuous Infusions:  albumin human       LOS: 22 days    Time spent: 35 minutes    Ramiro Harvest, MD [facility]   To contact the attending provider between 7A-7P or the covering provider during after hours 7P-7A, please log into the web site [email or web address] and access using universal [place] password for that web site. If you do not have the password, please call the hospital operator.  04/05/2022, 12:37 PM

## 2022-04-05 NOTE — Plan of Care (Signed)
  Problem: Education: Goal: Knowledge of General Education information will improve Description: Including pain rating scale, medication(s)/side effects and non-pharmacologic comfort measures Outcome: Progressing   Problem: Health Behavior/Discharge Planning: Goal: Ability to manage health-related needs will improve Outcome: Progressing

## 2022-04-06 ENCOUNTER — Encounter (HOSPITAL_COMMUNITY): Payer: Self-pay | Admitting: Pulmonary Disease

## 2022-04-06 ENCOUNTER — Inpatient Hospital Stay (HOSPITAL_COMMUNITY): Payer: Medicare PPO

## 2022-04-06 DIAGNOSIS — J690 Pneumonitis due to inhalation of food and vomit: Secondary | ICD-10-CM | POA: Diagnosis not present

## 2022-04-06 DIAGNOSIS — I5033 Acute on chronic diastolic (congestive) heart failure: Secondary | ICD-10-CM | POA: Diagnosis not present

## 2022-04-06 DIAGNOSIS — J101 Influenza due to other identified influenza virus with other respiratory manifestations: Secondary | ICD-10-CM | POA: Diagnosis not present

## 2022-04-06 DIAGNOSIS — A419 Sepsis, unspecified organism: Secondary | ICD-10-CM | POA: Diagnosis not present

## 2022-04-06 DIAGNOSIS — J9601 Acute respiratory failure with hypoxia: Secondary | ICD-10-CM | POA: Diagnosis not present

## 2022-04-06 LAB — MAGNESIUM: Magnesium: 2.1 mg/dL (ref 1.7–2.4)

## 2022-04-06 LAB — RESPIRATORY PANEL BY PCR

## 2022-04-06 LAB — RENAL FUNCTION PANEL
Albumin: 4.6 g/dL (ref 3.5–5.0)
Anion gap: 13 (ref 5–15)
BUN: 71 mg/dL — ABNORMAL HIGH (ref 8–23)
CO2: 27 mmol/L (ref 22–32)
Calcium: 9.7 mg/dL (ref 8.9–10.3)
Chloride: 94 mmol/L — ABNORMAL LOW (ref 98–111)
Creatinine, Ser: 0.72 mg/dL (ref 0.44–1.00)
GFR, Estimated: >60 mL/min (ref >60)
Glucose, Bld: 70 mg/dL (ref 70–99)
Phosphorus: 4.6 mg/dL (ref 2.5–4.6)
Potassium: 4 mmol/L (ref 3.5–5.1)
Sodium: 134 mmol/L — ABNORMAL LOW (ref 135–145)

## 2022-04-06 LAB — CBC
HCT: 31.3 % — ABNORMAL LOW (ref 36.0–46.0)
Hemoglobin: 10 g/dL — ABNORMAL LOW (ref 12.0–15.0)
MCH: 30.2 pg (ref 26.0–34.0)
MCHC: 31.9 g/dL (ref 30.0–36.0)
MCV: 94.6 fL (ref 80.0–100.0)
Platelets: 234 10*3/uL (ref 150–400)
RBC: 3.31 MIL/uL — ABNORMAL LOW (ref 3.87–5.11)
RDW: 15.5 % (ref 11.5–15.5)
WBC: 11.4 10*3/uL — ABNORMAL HIGH (ref 4.0–10.5)
nRBC: 0 % (ref 0.0–0.2)

## 2022-04-06 LAB — GLUCOSE, CAPILLARY
Glucose-Capillary: 132 mg/dL — ABNORMAL HIGH (ref 70–99)
Glucose-Capillary: 142 mg/dL — ABNORMAL HIGH (ref 70–99)
Glucose-Capillary: 209 mg/dL — ABNORMAL HIGH (ref 70–99)
Glucose-Capillary: 367 mg/dL — ABNORMAL HIGH (ref 70–99)
Glucose-Capillary: 400 mg/dL — ABNORMAL HIGH (ref 70–99)

## 2022-04-06 MED ORDER — SODIUM CHLORIDE 0.9 % IV SOLN
INTRAVENOUS | Status: AC
  Filled 2022-04-06: qty 500

## 2022-04-06 MED ORDER — FENTANYL CITRATE (PF) 100 MCG/2ML IJ SOLN
INTRAMUSCULAR | Status: AC | PRN
  Administered 2022-04-06 (×2): 50 ug via INTRAVENOUS

## 2022-04-06 MED ORDER — NALOXONE HCL 0.4 MG/ML IJ SOLN
INTRAMUSCULAR | Status: AC
  Filled 2022-04-06: qty 1

## 2022-04-06 MED ORDER — OSELTAMIVIR PHOSPHATE 30 MG PO CAPS
30.0000 mg | ORAL_CAPSULE | Freq: Two times a day (BID) | ORAL | Status: AC
  Administered 2022-04-06 – 2022-04-10 (×9): 30 mg via ORAL
  Filled 2022-04-06 (×9): qty 1

## 2022-04-06 MED ORDER — LIDOCAINE HCL (PF) 1 % IJ SOLN
INTRAMUSCULAR | Status: AC | PRN
  Administered 2022-04-06: 10 mL

## 2022-04-06 MED ORDER — INSULIN ASPART 100 UNIT/ML IJ SOLN
10.0000 [IU] | Freq: Once | INTRAMUSCULAR | Status: AC
  Administered 2022-04-06: 10 [IU] via SUBCUTANEOUS

## 2022-04-06 MED ORDER — MIDAZOLAM HCL 2 MG/2ML IJ SOLN
INTRAMUSCULAR | Status: AC
  Filled 2022-04-06: qty 4

## 2022-04-06 MED ORDER — BUPIVACAINE HCL (PF) 0.5 % IJ SOLN
INTRAMUSCULAR | Status: AC
  Administered 2022-04-06: 150 mg via INTRAMUSCULAR
  Filled 2022-04-06: qty 30

## 2022-04-06 MED ORDER — TRIAMCINOLONE ACETONIDE 40 MG/ML IJ SUSP
80.0000 mg | Freq: Once | INTRAMUSCULAR | Status: AC
  Administered 2022-04-06: 80 mg via INTRA_ARTICULAR
  Filled 2022-04-06: qty 2

## 2022-04-06 MED ORDER — MIDAZOLAM HCL 2 MG/2ML IJ SOLN
INTRAMUSCULAR | Status: AC | PRN
  Administered 2022-04-06 (×2): 1 mg via INTRAVENOUS

## 2022-04-06 MED ORDER — FENTANYL CITRATE (PF) 100 MCG/2ML IJ SOLN
INTRAMUSCULAR | Status: AC
  Filled 2022-04-06: qty 4

## 2022-04-06 MED ORDER — BUPIVACAINE HCL (PF) 0.5 % IJ SOLN: 150.0000 mL | Freq: Once | INTRAMUSCULAR | Status: DC

## 2022-04-06 MED ORDER — OSELTAMIVIR PHOSPHATE 75 MG PO CAPS: 75.0000 mg | ORAL_CAPSULE | Freq: Two times a day (BID) | ORAL | Status: DC

## 2022-04-06 MED ORDER — BUPIVACAINE HCL (PF) 0.5 % IJ SOLN
30.0000 mL | Freq: Once | INTRAMUSCULAR | Status: DC
  Filled 2022-04-06: qty 30

## 2022-04-06 MED ORDER — IOHEXOL 300 MG/ML  SOLN
30.0000 mL | Freq: Once | INTRAMUSCULAR | Status: AC | PRN
  Administered 2022-04-06: 30 mL

## 2022-04-06 MED ORDER — INSULIN ASPART 100 UNIT/ML IJ SOLN
0.0000 [IU] | Freq: Three times a day (TID) | INTRAMUSCULAR | Status: DC
  Administered 2022-04-06: 5 [IU] via SUBCUTANEOUS
  Administered 2022-04-07: 15 [IU] via SUBCUTANEOUS
  Administered 2022-04-07: 11 [IU] via SUBCUTANEOUS
  Administered 2022-04-07: 3 [IU] via SUBCUTANEOUS
  Administered 2022-04-08: 11 [IU] via SUBCUTANEOUS
  Administered 2022-04-08: 15 [IU] via SUBCUTANEOUS
  Administered 2022-04-09: 8 [IU] via SUBCUTANEOUS
  Administered 2022-04-09: 5 [IU] via SUBCUTANEOUS
  Administered 2022-04-09: 2 [IU] via SUBCUTANEOUS
  Administered 2022-04-10 (×2): 5 [IU] via SUBCUTANEOUS
  Administered 2022-04-10: 3 [IU] via SUBCUTANEOUS
  Administered 2022-04-11: 2 [IU] via SUBCUTANEOUS
  Administered 2022-04-11: 8 [IU] via SUBCUTANEOUS
  Administered 2022-04-11: 5 [IU] via SUBCUTANEOUS
  Administered 2022-04-12: 2 [IU] via SUBCUTANEOUS
  Administered 2022-04-12: 8 [IU] via SUBCUTANEOUS

## 2022-04-06 MED ORDER — OSELTAMIVIR PHOSPHATE 75 MG PO CAPS
75.0000 mg | ORAL_CAPSULE | Freq: Once | ORAL | Status: AC
  Administered 2022-04-06: 75 mg via ORAL
  Filled 2022-04-06: qty 1

## 2022-04-06 MED ORDER — FLUMAZENIL 0.5 MG/5ML IV SOLN
INTRAVENOUS | Status: AC
  Filled 2022-04-06: qty 5

## 2022-04-06 NOTE — Inpatient Diabetes Management (Signed)
Inpatient Diabetes Program Recommendations  AACE/ADA: New Consensus Statement on Inpatient Glycemic Control (2015)  Target Ranges:  Prepandial:   less than 140 mg/dL      Peak postprandial:   less than 180 mg/dL (1-2 hours)      Critically ill patients:  140 - 180 mg/dL   Lab Results  Component Value Date   GLUCAP 132 (H) 04/06/2022   HGBA1C 4.6 (L) 01/06/2022    Review of Glycemic Control  Latest Reference Range & Units 04/05/22 17:26 04/05/22 23:52 04/06/22 06:16  Glucose-Capillary 70 - 99 mg/dL 389 (H) 400 (H) 132 (H)  (H): Data is abnormally high Diabetes history: none noted Current orders for Inpatient glycemic control: Novolog 0-20 units Q6H, Novolog 0-15 units TID Decadron 4 mg QD Inpatient Diabetes Program Recommendations:    Consider discontinuing Novolog 0-20 units Q6H.  Thanks, Bronson Curb, MSN, RNC-OB Diabetes Coordinator [phone] (8a-5p)

## 2022-04-06 NOTE — Progress Notes (Signed)
PHARMACY NOTE:  ANTIMICROBIAL RENAL DOSAGE ADJUSTMENT  Current antimicrobial regimen includes a mismatch between antiviral dosage and estimated renal function.  As per policy approved by the Pharmacy & Therapeutics and Medical Executive Committees, the antimicrobial dosage will be adjusted accordingly.  Current antiviral dosage:  Tamiflu 75 mg PO BID x 5 days  Indication: Influenza treatment  Renal Function:  Estimated Creatinine Clearance: 53.8 mL/min (by C-G formula based on SCr of 0.72 mg/dL). []      On intermittent HD, scheduled: []      On CRRT    Antiviral dosage has been changed to:  Tamiflu 75 mg PO once followed by 30 mg PO BID to complete a 5 day course  Lenis Noon, PharmD 04/06/22 9:41 AM

## 2022-04-06 NOTE — Procedures (Addendum)
Vascular and Interventional Radiology Procedure Note  Patient: Carolyn [name] DOB: 03-15-1956 Medical Record Number: [id number] Note Date/Time: 04/06/22 11:25 AM   Performing Physician: Michaelle Birks, MD Assistant(s): None  Diagnosis: Chronic pancreatitis with intractable abdominal pain.    Procedure: CELIAC PLEXUS NERVE BLOCK   Anesthesia: Conscious Sedation Complications: None Estimated Blood Loss:  0 mL Specimens: Sent for None   Findings:  Successful CT-guided celiac plexus nerve block, by a bilateral posterior approach. 40 mg/mL Kenalog and 0.5% Bupivacaine was administed Hemostasis of the tract was achieved using Manual Pressure.   Plan: Bed rest for 2  hours.  The patient will be re-evaluated in the postprocedural setting and again in 3-6 months to assess the efficacy of this nerve block. If successful, the patient may be a candidate for future celiac plexus nerve blocks versus neurolysis.   See detailed procedure note with images in PACS. The patient tolerated the procedure well without incident or complication and was returned to Floor Bed in stable condition.    Michaelle Birks, MD [facility]   Pager. [place]

## 2022-04-06 NOTE — Progress Notes (Signed)
PT Cancellation Note  Patient Details Name: Carolyn [name] MRN: [id number] DOB: 09-07-1955   Cancelled Treatment:    Reason Eval/Treat Not Completed: Patient at procedure or test/unavailable. Pt going for procedure today. Will continue to follow as appropriate.    Talbot Grumbling PT, DPT 04/06/22, 9:56 AM

## 2022-04-06 NOTE — Progress Notes (Signed)
PROGRESS NOTE    Carolyn [name]  WUJ:[id number] DOB: [birth year] DOA: 03/13/2022 PCP: Pcp, No    Chief Complaint  Patient presents with   Skin Problem    Brief Narrative:  HPI This is a 67 year old female, past medical history of chronic pancreatitis, history of prior alcoholism, gastroesophageal reflux, prior vitamin D deficiency documented. She has known malabsorption issues and has been on Creon. She presents with cellulitis and sepsis related to a ongoing rash of the hands and feet. It appears that the rash has been going on for several weeks continued to get worse. As started off with a small blister the blister then ruptured the skin started to dry out crack and flake. She is starting to flake and desquamate portions of the hands and feet. She developed increased pain and swelling in the right hand that developed redness that tracks into the forearm. She presents to the ER with an elevated white count and was treated with antibiotics for concern of sepsis.  Patient admitted to critical care team subsequently transferred to [facility] service on 01/14/2021 after stabilization noted to be in significant pain, palliative care GI consulted. -Patient noted to have been transfused 2 units packed red blood cells given trending hemoglobin. -Patient seen by GI for further evaluation and also by palliative care who have adjusted pain medications patient started on IV Decadron with improvement with pain management. -Patient also noted to go into volume overload, placed on IV Lasix with good diuresis with adjustment in diuretics. -Patient will also with on calorie count which have improved TPN can be weaned down.  Assessment & Plan:   Principal Problem:   Severe sepsis (HCC) Active Problems:   Severe protein-calorie malnutrition (HCC)   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HCC)   Acute respiratory failure with hypoxia (HCC)   Chronic gastric ulcer with perforation (HCC)   Desquamative dermatitis   Sepsis  (HCC)   Cellulitis of left lower extremity   Hypophosphatemia   Anemia of chronic disease   Localized swelling of right upper extremity   NSVT (nonsustained ventricular tachycardia) (HCC)   History of gastric ulcer   Abnormal loss of weight   Aspiration pneumonia of both lungs (HCC)   Acute on chronic diastolic CHF (congestive heart failure) (HCC)   Influenza A   #1 severe sepsis secondary to discriminative dermatitis with superimposed cellulitis and possible underlying osteomyelitis -Patient noted to have presented with complicated nonhealing desquamative dermatitis involving hands and feet complicated by severe sepsis due to cellulitis/wound infection. -MRI of bilateral hands and feet done without any definite evidence of osteomyelitis. -Patient initially admitted to the critical care service, concern for probable vitamin/nutritional deficiencies versus paraneoplastic syndromes. -Concern for possible nutritional deficiencies, B2, B6, tryptophan, zinc, thiamine etc. could lead to this. -Patient noted to be prior alcoholic now with a chronic pancreatitis on Creon and likely has pre-existing condition of malabsorption as well as possible gastric surgery unsure as to what the patient has had this on not however per PCCM. -Patient placed empirically on IV vancomycin, IV Rocephin and subsequently transition to doxycycline and Augmentin and just completed a 10-day course of antibiotic treatment 03/22/2022.  -Patient noted to go into acute hypoxic respiratory failure with concerns for possible aspiration pneumonia and volume overload and placed empirically on IV Unasyn to treat for total of 10 days. -Continue vitamin D3, vitamin B12, zinc, vitamin D3. -Continue vitamin A, selenium. -Will need follow-up with dermatology (we have followed in [place]) -Will need outpatient follow-up with podiatry  for bone biopsy to evaluate for osteomyelitis. -Outpatient follow-up with ID was  set up for 03/22/2022 however will need to be rescheduled as patient is still hospitalized. -Patient unable to ambulate seen by PT would likely require SNF placement. -Patient with ongoing pain, causing tachycardia, continue current regimen and IV Dilaudid as needed. -Continue current pain regimen as recommended by palliative care of Dilaudid 2 mg every 2 hours, fentanyl patch 25 mcg increased to 50 mcg and subsequently increased to 100 mcg/h every 72 hours on 03/31/2022.  Patient also noted on T3 and phenobarbital for anxiety at night.   -Due to drowsiness Fentanyl patch decreased to 50 mcg daily on 04/04/2022 per palliative care. -Patient also placed on Decadron 8 mg IV daily and wean down to 4 mg daily and recommended a long taper over several weeks per palliative care as well as aggressive diuresis and antibiotics with repeat of T3, T4 and cortisol level. -Patient being referred for celiac block by palliative care per IR to be done today, 04/06/2022. -Palliative care managing pain regimen.  -Continue supportive care. -Palliative care following and appreciate input and recommendations.  2.  Acute hypoxic respiratory failure likely secondary to acute on chronic diastolic CHF exacerbation +/- aspiration pneumonia/influenza A -Patient noted to have a bout of nausea and emesis the evening of 03/22/2022, noted to have increased O2 requirements and required 5 L nasal cannula from being on room air the day prior. -Chest x-ray done with new bilateral patchy airspace opacities possibly due to multifocal infection or pulmonary edema, small bilateral pleural effusions. -BNP noted to be elevated at 3564.6 -Continue IV albumin to every 12 hours.  -Patient was placed on IV Lasix and currently on 60 mg IV every 12 hours patient with good urine output of 1.950 L over the past 24 hours.   -Current weight of 105.82 pounds.  -O2 sats improving currently 97% on RA.  -Respiratory viral panel positive for influenza  A. -Start Tamiflu. -Strict I's and O's. -Daily weights. -Status post 10 days IV Unasyn. -Continue Mucinex due to coarse breath sounds.    3.  History of chronic alcoholic pancreatitis -Continue Creon.   4.  Hepatic cirrhosis with ascites/transaminitis -Being followed in the outpatient setting by [facility]. -BP soft and as such diuretics held spironolactone and Lasix held early on during the hospitalization. -.CT Abdomen and Pelvis done and showed "Marked diffuse subcutaneous body wall edema. Mild ascites. The constellation of findings can be seen the setting of anasarca. Punctate free intraperitoneal gas within the upper abdomen adjacent to numerous surgical clips involving the distal stomach. A site of bowel perforation is not clearly identified and there is no leakage of oral contrast. Correlation for an exogenous source of gas, as can be seen with paracentesis, is recommended. Mild hepatic steatosis. Changes in keeping with chronic pancreatitis."  -Patient with no hepatic encephalopathy. -Patient currently on IV Lasix 60 mg every 12 hours due to concerns for volume overload with good diuresis. -As volume status improves will likely transition to oral Lasix and could likely at that time place also on spironolactone hopefully in the next 1 to 2 days.. -Outpatient follow-up with GI.  5.  Gastric chronic ulcer with contained perforation -Stable. 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   -Hemoglobin seems to be stabilizing.    -Continue PPI twice daily, Pepcid twice daily.  -GI consulted, assessed patient, CT chest abdomen and pelvis obtained per GI which showed  multi focal groundglass pulmonary infiltrates new from 03/15/2022 more prevalent within the lung apices bilaterally possibly related to atypical infection in the acute setting, large right and small left pleural effusions, marked diffuse subcutaneous body wall edema mild ascites can be seen with anasarca.  Punctuate free intraperitoneal gas within the upper abdomen adjacent to numerous surgical clips involving the distal stomach.  A site of bowel perforation is not clearly identified and no leakage of contrast noted.  Correlate with an exogenous source of gas as can be seen with paracentesis recommended.  Hepatic steatosis.  Changes of chronic pancreatitis.  No definite superimposed acute peripancreatic inflammatory changes noted. -Patient assessed by GI who reviewed CT scans, discussed patient's prior surgeries and feel at this time patient does not need any further endoscopic evaluation and to follow for now. -Follow H&H, transfusion threshold hemoglobin  < 8.   6.  Hypothyroidism -TSH noted at 33.958 on 03/15/2022. -Continue Synthroid 50 mcg's daily.   -Will need repeat labs in 2-4 weeks in the outpatient setting.    6.  Hypophosphatemia/hypomagnesemia/hypokalemia -Magnesium at 2.1.  -Phosphorus at 4.6, potassium at 4 -Repeat labs in the AM.    7.  Severe protein calorie malnutrition -Patient seen by dietitian, PICC line placed and patient started on TPN per pharmacy. -Albumin level noted at< 1.5 -IV albumin decreased to every 12 hours. -TPN has been weaned off.   -Continue oral selenium, multivitamin, vitamin B6 100 mg daily, vitamin EE 400 units daily, vitamin A 10,000 units every other day.  -Dietitian following.  8.  Anemia of chronic disease/folate deficiency -Hemoglobin noted to go as low as 6.5, status post transfusion 1 unit packed red blood cells.   Hemoglobin noted at 7.1 on 03/20/2022.  Status posttransfusion 2 units packed red blood cells hemoglobin currently at 10.0. -Patient with  history of large prepyloric deep cratered ulcer per EGD 01/19/2022 with what was felt to be self-contained fistulous tract communicating with bulb with moderate portal gastropathy, no varices noted at that time.   -Hemoglobin seemed to be trickling back down.   -GI consulted for further evaluation and management to see if repeat EGD needed as well.   -CT chest abdomen and pelvis done on 03/24/2022 per GI recommendations with multi focal groundglass pulmonary infiltrates new from 03/15/2022 more prevalent within the lung apices bilaterally possibly related to atypical infection in the acute setting, large right and small left pleural effusions, marked diffuse subcutaneous body wall edema mild ascites can be seen with anasarca.  Punctuate free intraperitoneal gas within the upper abdomen adjacent to numerous surgical clips involving the distal stomach.  A site of bowel perforation is not clearly identified and no leakage of contrast noted.  Correlate with an exogenous source of gas as can be seen with paracentesis recommended.  Hepatic steatosis.  Changes of chronic pancreatitis.  No definite superimposed acute peripancreatic inflammatory changes noted. -Patient assessed by GI who reviewed CT scans, discussed patient's prior surgeries and feel at this time patient does not need any further endoscopic evaluation and to follow for now. -Hemoglobin noted to have stabilized at 10.0. -Follow H&H, transfusion threshold hemoglobin  < 8.   9.  Right upper extremity swelling and pain -Improved with upper extremity elevation and on diuretics.   -  Upper extremity Dopplers negative for DVT.    10.  NSVT -Patient noted to have 7 beat run of NSVT on 03/19/2022, patient was asymptomatic.   -Electrolytes repleted potassium at 4.0, magnesium at 2.1. -Hemoglobin noted at 7.1 on 03/20/2022, status post transfusion 3 units packed red blood cells during this hospitalization hemoglobin stable at 10.0.  -Repeat labs in the AM.  11.   Influenza A -Noted on respiratory viral panel. -Tamiflu.   DVT prophylaxis: Heparin Code Status: Full Family Communication: Updated patient.  No family at bedside.  Disposition: SNF.  Status is: Inpatient Remains inpatient appropriate because: Severity of illness.  Unsafe disposition.   Consultants:  PCCM admission. Infectious disease: Dr. Thedore Mins 03/15/2022 Wound care RN 03/14/2022 Palliative care: Dr. Phillips Odor 03/23/2022 Gastroenterology: Dr. Myrtie Neither III 03/23/2022  Procedures:  Plain films of bilateral feet 03/13/2022 Chest x-ray 03/15/2022, 03/26/2022, 03/28/2022, 03/30/2022, 03/31/2022 MRI left foot and right foot 03/13/2022 PICC line placement 03/17/2022 Transfused 2 units packed red blood cells 03/20/2022 Transfuse 1 unit packed red blood cells 03/15/2022 Right upper extremity Dopplers 03/19/2022 CT chest abdomen and pelvis 03/24/2022 Acute abdominal series 03/23/2022  Antimicrobials:  Anti-infectives (From admission, onward)    Start     Dose/Rate Route Frequency Ordered Stop   04/06/22 1030  oseltamivir (TAMIFLU) capsule 75 mg        75 mg Oral 2 times daily 04/06/22 0934 04/11/22 0959   04/05/22 1200  fluconazole (DIFLUCAN) tablet 200 mg        200 mg Oral Daily 04/05/22 1109 04/19/22 0959   03/23/22 1215  Ampicillin-Sulbactam (UNASYN) 3 g in sodium chloride 0.9 % 100 mL IVPB        3 g 200 mL/hr over 30 Minutes Intravenous Every 6 hours 03/23/22 1118 04/03/22 0237   03/19/22 2200  amoxicillin-clavulanate (AUGMENTIN) 875-125 MG per tablet 1 tablet        1 tablet Oral Every 12 hours 03/19/22 1115 03/22/22 2231   03/19/22 2200  doxycycline (VIBRA-TABS) tablet 100 mg        100 mg Oral Every 12 hours 03/19/22 1115 03/22/22 2230   03/14/22 2200  vancomycin (VANCOREADY) IVPB 500 mg/100 mL  Status:  Discontinued        500 mg 100 mL/hr over 60 Minutes Intravenous Every 24 hours 03/13/22 2049 03/19/22 1116   03/13/22 1915  vancomycin (VANCOCIN) IVPB 1000 mg/200 mL premix        1,000  mg 200 mL/hr over 60 Minutes Intravenous  Once 03/13/22 1908 03/14/22 0103   03/13/22 1615  cefTRIAXone (ROCEPHIN) 2 g in sodium chloride 0.9 % 100 mL IVPB        2 g 200 mL/hr over 30 Minutes Intravenous Every 24 hours 03/13/22 1603 03/19/22 1959         Subjective: Still with cough. No sig SOB. No CP.  States pain currently controlled.  Awaiting to go for celiac plexus block.  Objective: Vitals:   04/06/22 0600 04/06/22 0634 04/06/22 0700 04/06/22 0800  BP: 136/85 137/86 131/87 107/72  Pulse:   78 85  Resp: 14  14 15   Temp:  97.8 F (36.6 C)    TempSrc:  Oral    SpO2:   98% 97%  Weight:      Height:        Intake/Output Summary (Last 24 hours) at 04/06/2022 0935 Last data filed at 04/06/2022 0704 Gross per 24 hour  Intake 610 ml  Output 1350 ml  Net -740 ml  Filed Weights   04/03/22 0500 04/04/22 0353 04/05/22 0500  Weight: 50.8 kg 48.1 kg 48 kg    Examination:  General exam: Cachectic.  Frail.  More alert today.  Respiratory system: Diffuse scattered coarse breath sounds.  No crackles.  Fair air movement.  Speaking in full sentences.  Gastrointestinal system: Abdomen is soft, nontender, nondistended, positive bowel sounds.  No rebound.  No guarding.   Central nervous system: Alert and oriented.  Moving extremities spontaneously.  No focal neurological deficits.  Extremities: Bilateral hands, bilateral feet wrapped in gauze/Kerlix.  Right upper extremity with significantly decreased swelling, nontender to palpation.  Skin: Decreased erythema right upper extremity, severe desquamative dermatitis changes right upper extremity bilateral feet.  Psychiatry: Judgement and insight appear normal. Mood & affect appropriate.     Data Reviewed: I have personally reviewed following labs and imaging studies  CBC: Recent Labs  Lab 03/31/22 0329 04/01/22 1020 04/02/22 0429 04/03/22 0229 04/05/22 0357 04/06/22 0338  WBC 11.3* 13.9* 16.2* 16.8* 10.9* 11.4*  NEUTROABS  9.7* 10.9*  --   --  8.9*  --   HGB 8.2* 9.3* 9.4* 9.2* 9.8* 10.0*  HCT 27.2* 29.9* 29.9* 29.4* 30.6* 31.3*  MCV 102.3* 100.7* 99.0 100.0 95.9 94.6  PLT 239 273 271 286 224 234     Basic Metabolic Panel: Recent Labs  Lab 04/02/22 0429 04/03/22 0229 04/04/22 0500 04/05/22 0357 04/06/22 0338  NA 139  139 134* 138 135 134*  K 3.6  3.6 3.8 3.9 3.5 4.0  CL 97*  97* 94* 98 94* 94*  CO2 30  29 26 28 25 27   GLUCOSE 96  98 152* 129* 112* 70  BUN 57*  58* 62* 75* 75* 71*  CREATININE 0.61  0.62 0.73 0.74 0.75 0.72  CALCIUM 9.4  9.3 9.2 9.5 9.8 9.7  MG 2.4 2.2 2.4 2.4 2.1  PHOS 3.9  4.0 4.6 6.1* 5.8* 4.6     GFR: Estimated Creatinine Clearance: 53.8 mL/min (by C-G formula based on SCr of 0.72 mg/dL).  Liver Function Tests: Recent Labs  Lab 03/31/22 0329 04/01/22 0322 04/02/22 0429 04/03/22 0229 04/05/22 0357 04/06/22 0338  AST 33 34  --  33 42*  --   ALT 33 32  --  28 32  --   ALKPHOS 86 90  --  120 107  --   BILITOT 0.5 0.5  --  0.5 0.8  --   PROT 5.8* 6.4*  --  6.7 7.9  --   ALBUMIN 3.6 3.6 4.3 4.6 4.8 4.6     CBG: Recent Labs  Lab 04/05/22 0536 04/05/22 1204 04/05/22 1726 04/05/22 2352 04/06/22 0616  GLUCAP 128* 112* 389* 400* 132*      Recent Results (from the past 240 hour(s))  Respiratory (~20 pathogens) panel by PCR     Status: Abnormal   Collection Time: 04/05/22 11:26 AM   Specimen: Nasopharyngeal Swab; Respiratory  Result Value Ref Range Status   Adenovirus NOT DETECTED NOT DETECTED Final   Coronavirus 229E NOT DETECTED NOT DETECTED Final    Comment: (NOTE) The Coronavirus on the Respiratory Panel, DOES NOT test for the novel  Coronavirus (2019 nCoV)    Coronavirus HKU1 NOT DETECTED NOT DETECTED Final   Coronavirus NL63 NOT DETECTED NOT DETECTED Final   Coronavirus OC43 NOT DETECTED NOT DETECTED Final   Metapneumovirus NOT DETECTED NOT DETECTED Final   Rhinovirus / Enterovirus NOT DETECTED NOT DETECTED Final   Influenza A H1 2009  DETECTED (A)  NOT DETECTED Final   Influenza B NOT DETECTED NOT DETECTED Final   Parainfluenza Virus 1 NOT DETECTED NOT DETECTED Final   Parainfluenza Virus 2 NOT DETECTED NOT DETECTED Final   Parainfluenza Virus 3 NOT DETECTED NOT DETECTED Final   Parainfluenza Virus 4 NOT DETECTED NOT DETECTED Final   Respiratory Syncytial Virus NOT DETECTED NOT DETECTED Final   Bordetella pertussis NOT DETECTED NOT DETECTED Final   Bordetella Parapertussis NOT DETECTED NOT DETECTED Final   Chlamydophila pneumoniae NOT DETECTED NOT DETECTED Final   Mycoplasma pneumoniae NOT DETECTED NOT DETECTED Final    Comment: Performed at [facility], [address] [place], [place] [address]  Group A Strep by PCR     Status: None   Collection Time: 04/05/22 11:27 AM   Specimen: Throat; Sterile Swab  Result Value Ref Range Status   Group A Strep by PCR NOT DETECTED NOT DETECTED Final    Comment: Performed at [facility], [address], [place], [place] [address]          Radiology Studies: No results found.      Scheduled Meds:  (feeding supplement) PROSource Plus  30 mL Oral BID BM   acetaminophen  1,000 mg Oral TID   Chlorhexidine Gluconate Cloth  6 each Topical Daily   cholecalciferol  1,000 Units Oral Daily   dexamethasone (DECADRON) injection  4 mg Intravenous Q24H   dextromethorphan-guaiFENesin  1 tablet Oral BID   enoxaparin (LOVENOX) injection  40 mg Subcutaneous Q24H   famotidine  10 mg Oral BID   feeding supplement  237 mL Oral TID BM   fentaNYL  1 patch Transdermal Q72H   fluconazole  200 mg Oral Daily   furosemide  60 mg Intravenous Q12H   gabapentin  300 mg Oral TID   insulin aspart  0-20 Units Subcutaneous Q6H   levothyroxine  50 mcg Oral Q0600   liothyronine  25 mcg Oral Daily   lipase/protease/amylase  24,000 Units Oral TID WC   metoprolol tartrate  25 mg Oral BID   multivitamin with minerals  1 tablet Oral Daily   mupirocin cream   Topical Once    oseltamivir  75 mg Oral BID   pantoprazole  40 mg Oral BID   phenobarbital  16.2 mg Oral QHS   pyridOXINE  100 mg Oral Daily   selenium  100 mcg Oral Daily   silver sulfADIAZINE   Topical Daily   sodium chloride flush  10-40 mL Intracatheter Q12H   vitamin A  10,000 Units Oral Q48H   vitamin B-12  100 mcg Oral Daily   vitamin E  400 Units Oral Daily   Continuous Infusions:  albumin human 12.5 g (04/05/22 2138)     LOS: 23 days    Time spent: 35 minutes    Ramiro Harvest, MD [facility]   To contact the attending provider between 7A-7P or the covering provider during after hours 7P-7A, please log into the web site [email or web address] and access using universal [place] password for that web site. If you do not have the password, please call the hospital operator.  04/06/2022, 9:35 AM

## 2022-04-07 DIAGNOSIS — R6520 Severe sepsis without septic shock: Secondary | ICD-10-CM | POA: Diagnosis not present

## 2022-04-07 DIAGNOSIS — A419 Sepsis, unspecified organism: Secondary | ICD-10-CM | POA: Diagnosis not present

## 2022-04-07 LAB — CBC WITH DIFFERENTIAL/PLATELET
Abs Immature Granulocytes: 0.08 10*3/uL — ABNORMAL HIGH (ref 0.00–0.07)
Basophils Absolute: 0 10*3/uL (ref 0.0–0.1)
Basophils Relative: 0 %
Eosinophils Absolute: 0 10*3/uL (ref 0.0–0.5)
Eosinophils Relative: 0 %
HCT: 31.6 % — ABNORMAL LOW (ref 36.0–46.0)
Hemoglobin: 10.2 g/dL — ABNORMAL LOW (ref 12.0–15.0)
Immature Granulocytes: 1 %
Lymphocytes Relative: 11 %
Lymphs Abs: 1 10*3/uL (ref 0.7–4.0)
MCH: 30.4 pg (ref 26.0–34.0)
MCHC: 32.3 g/dL (ref 30.0–36.0)
MCV: 94 fL (ref 80.0–100.0)
Monocytes Absolute: 0.6 10*3/uL (ref 0.1–1.0)
Monocytes Relative: 6 %
Neutro Abs: 8 10*3/uL — ABNORMAL HIGH (ref 1.7–7.7)
Neutrophils Relative %: 82 %
Platelets: 258 10*3/uL (ref 150–400)
RBC: 3.36 MIL/uL — ABNORMAL LOW (ref 3.87–5.11)
RDW: 15.9 % — ABNORMAL HIGH (ref 11.5–15.5)
WBC: 9.7 10*3/uL (ref 4.0–10.5)
nRBC: 0 % (ref 0.0–0.2)

## 2022-04-07 LAB — COMPREHENSIVE METABOLIC PANEL
ALT: 31 U/L (ref 0–44)
AST: 32 U/L (ref 15–41)
Albumin: 5.4 g/dL — ABNORMAL HIGH (ref 3.5–5.0)
Alkaline Phosphatase: 98 U/L (ref 38–126)
Anion gap: 14 (ref 5–15)
BUN: 84 mg/dL — ABNORMAL HIGH (ref 8–23)
CO2: 26 mmol/L (ref 22–32)
Calcium: 10.1 mg/dL (ref 8.9–10.3)
Chloride: 93 mmol/L — ABNORMAL LOW (ref 98–111)
Creatinine, Ser: 0.72 mg/dL (ref 0.44–1.00)
GFR, Estimated: >60 mL/min (ref >60)
Glucose, Bld: 179 mg/dL — ABNORMAL HIGH (ref 70–99)
Potassium: 3.6 mmol/L (ref 3.5–5.1)
Sodium: 133 mmol/L — ABNORMAL LOW (ref 135–145)
Total Bilirubin: 0.7 mg/dL (ref 0.3–1.2)
Total Protein: 8.2 g/dL — ABNORMAL HIGH (ref 6.5–8.1)

## 2022-04-07 LAB — GLUCOSE, CAPILLARY
Glucose-Capillary: 167 mg/dL — ABNORMAL HIGH (ref 70–99)
Glucose-Capillary: 382 mg/dL — ABNORMAL HIGH (ref 70–99)
Glucose-Capillary: 311 mg/dL — ABNORMAL HIGH (ref 70–99)
Glucose-Capillary: 254 mg/dL — ABNORMAL HIGH (ref 70–99)

## 2022-04-07 LAB — HEMOGLOBIN A1C
Hgb A1c MFr Bld: 5.3 % (ref 4.8–5.6)
Mean Plasma Glucose: 105.41 mg/dL

## 2022-04-07 LAB — PHOSPHORUS: Phosphorus: 5.9 mg/dL — ABNORMAL HIGH (ref 2.5–4.6)

## 2022-04-07 LAB — MAGNESIUM: Magnesium: 2.4 mg/dL (ref 1.7–2.4)

## 2022-04-07 MED ORDER — INSULIN ASPART 100 UNIT/ML IJ SOLN
3.0000 [IU] | Freq: Three times a day (TID) | INTRAMUSCULAR | Status: DC
  Administered 2022-04-07 – 2022-04-12 (×14): 3 [IU] via SUBCUTANEOUS

## 2022-04-07 MED ORDER — SACCHAROMYCES BOULARDII 250 MG PO CAPS
250.0000 mg | ORAL_CAPSULE | Freq: Two times a day (BID) | ORAL | Status: DC
  Administered 2022-04-07 – 2022-04-12 (×10): 250 mg via ORAL
  Filled 2022-04-07 (×11): qty 1

## 2022-04-07 MED ORDER — INSULIN GLARGINE-YFGN 100 UNIT/ML ~~LOC~~ SOLN
6.0000 [IU] | Freq: Every day | SUBCUTANEOUS | Status: DC
  Administered 2022-04-07 – 2022-04-08 (×2): 6 [IU] via SUBCUTANEOUS
  Filled 2022-04-07 (×2): qty 0.06

## 2022-04-07 MED ORDER — LOPERAMIDE HCL 2 MG PO CAPS
2.0000 mg | ORAL_CAPSULE | Freq: Four times a day (QID) | ORAL | Status: DC | PRN
  Administered 2022-04-07: 2 mg via ORAL
  Filled 2022-04-07: qty 1

## 2022-04-07 NOTE — Progress Notes (Signed)
Nutrition Follow-up  DOCUMENTATION CODES:   Severe malnutrition in context of chronic illness, Underweight  INTERVENTION:  - Regular diet. - Ensure Plus High Protein po BID, each supplement provides 350 kcal and 20 grams of protein as tolerated - Prosource Plus PO BID, each provides 100 kcals and 15g protein   - Supplement 100,000 units of Vitamin A x 3 days (complete) - followed by 50,000 units x 14 days (complete) - Continue 5,000 units daily following repletion (pt receiving 10000 units Q48H)   - 400 units Vitamin E daily  - patient has received ~3 weeks of supplementation, will recheck vitamin E level. If WNL, would discontinue supplementation.  - 100 mg Vitamin B-6 daily x 30 days (end date 2/4).     - 114mcg vitamin B12 as medically appropriate.    - Patient should continue Vitamin D and A supplementation at discharge as she has history of deficiency  NUTRITION DIAGNOSIS:   Severe Malnutrition related to chronic illness (chronic pancreatitis, h/o multiple gastric surgeries including gastric bypass) as evidenced by severe fat depletion, severe muscle depletion. *ongoing  GOAL:   Patient will meet greater than or equal to 90% of their needs *progressing  MONITOR:   PO intake, Supplement acceptance, Weight trends, Labs, I & O's, Skin  REASON FOR ASSESSMENT:   Consult Assessment of nutrition requirement/status, Calorie Count  ASSESSMENT:   67 year old female, past medical history of chronic pancreatitis, history of prior alcoholism, gastroesophageal reflux, prior vitamin D deficiency documented.  She has known malabsorption issues and has been on Creon.  She presents with cellulitis and sepsis related to a ongoing rash of the hands and feet. She presents to the ER with an elevated white count and was treated with antibiotics for concern of sepsis.  1/3 TPN started 1/14 Calorie count started 1/17 Calorie count complete, patient meeting 100% of estimated needs with  oral intake 1/21 TPN weaned off   Patient continues to eat well and drink supplements. Documented to be consuming average of 50% of meals over the past 5 days. She drinks all three Ensure and both prosource daily in addition to her meals.  Patient is noted to have elevated blood glucose. She has now tested positive for the flu and is receiving decadron which is likely affecting blood sugar control. Patient has been receiving Ensure since 12/30 and has been drinking it consistently, when while on TPN, so do not suspect this to be causing elevated blood sugars. Will keep ensure on but continue to monitor.  She continues to receive vitamin supplementation for numerous deficiencies. Will check vitamin E level as patient has received ~3 weeks of supplementation. It is noted she did not get her vitamin E capsule today as pharmacy had to receive from [facility].   Weight continues to fluctuate this admission. Patient is noted to be -13L which is likely affecting current weight status.   Medications reviewed and include: 1000 units vitamin D, MVI, 100mg  vitamin B6, 100mg  selenium, 10000 units vitamin A, 144mcg vitamin B12, 400 units vitamin E Decadron Creon Lasix  Labs reviewed:  Na 133 Phosphorus 5.9   Diet Order:   Diet Order             Diet regular Room service appropriate? Yes; Fluid consistency: Thin  Diet effective now                   EDUCATION NEEDS:  Education needs have been addressed  Skin:  Skin Assessment: Skin Integrity Issues: Skin Integrity  Issues:: Unstageable, Stage I Stage I: Mid coccyx Unstageable: R knee Other: red, flaky rashes to bilateral heels, feet and hands  Last BM:  1/24  Height:  Ht Readings from Last 1 Encounters:  03/16/22 5\' 8"  (1.727 m)   Weight:  Wt Readings from Last 1 Encounters:  04/07/22 49.3 kg    BMI:  Body mass index is 16.53 kg/m.  Estimated Nutritional Needs:  Kcal:  1900-2100 Protein:  95-105g Fluid:  2L/day    Samson Frederic RD, LDN For contact information, refer to [facility].

## 2022-04-07 NOTE — TOC Progression Note (Signed)
Transition of Care [facility]) - Progression Note    Patient Details  Name: Carolyn [name] MRN: [id number] Date of Birth: 1955/08/09  Transition of Care [facility]) CM/SW Contact  Leeroy Cha, RN Phone Number: 04/07/2022, 11:28 AM  Clinical Narrative:    [facility] declined.  Tct-Don[facility] in pg would be good. Alvia Grove at [employer] and texted information wcb.    Expected Discharge Plan: [place] Barriers to Discharge: Continued Medical Work up  Expected Discharge Plan and Services   Discharge Planning Services: CM Consult Post Acute Care Choice: [place] arrangements for the past 2 months: Single Family Home                                       Social Determinants of Health (SDOH) Interventions SDOH Screenings   Food Insecurity: No Food Insecurity (03/15/2022)  Housing: Low Risk  (03/15/2022)  Transportation Needs: No Transportation Needs (03/15/2022)  Utilities: At Risk (03/15/2022)  Depression (PHQ2-9): Low Risk  (07/22/2020)  Tobacco Use: Low Risk  (04/06/2022)    Readmission Risk Interventions   No data to display

## 2022-04-07 NOTE — Progress Notes (Signed)
Patient ID: Carolyn [name], female   DOB: 09-Dec-1955, 67 y.o.   MRN: [id number] Pt reports some decrease in abd pain, s/p celiac plexus block yesterday; afebrile; BP ok; WBC nl, hgb stable; has had some loose stools; further plans as per primary team

## 2022-04-07 NOTE — TOC Progression Note (Addendum)
Transition of Care [facility]) - Progression Note    Patient Details  Name: Carolyn [name] MRN: [id number] Date of Birth: 07/17/1955  Transition of Care [facility]) CM/SW Contact  Leeroy Cha, RN Phone Number: 04/07/2022, 8:21 AM  Clinical Narrative:    Iv tpn has been dcd, iv albumin ongoing.  Following to see if she can return to snf. Tct-husband Carolyn [name]-pt has not been out of bed since admission.  And was dx'd with the flu on [id number]. Husband does choose [facility] for snf placement.  Will require aiuth. Md made aware of need for actual pt evaluation. DENIED BY [facility], FL2 FAXED OUT TO AREA AND SURROUNDING AREA FOR CONSIDERATION. Expected Discharge Plan: [place] Barriers to Discharge: Continued Medical Work up  Expected Discharge Plan and Services   Discharge Planning Services: CM Consult Post Acute Care Choice: [place] arrangements for the past 2 months: Single Family Home                                       Social Determinants of Health (SDOH) Interventions SDOH Screenings   Food Insecurity: No Food Insecurity (03/15/2022)  Housing: Low Risk  (03/15/2022)  Transportation Needs: No Transportation Needs (03/15/2022)  Utilities: At Risk (03/15/2022)  Depression (PHQ2-9): Low Risk  (07/22/2020)  Tobacco Use: Low Risk  (04/06/2022)    Readmission Risk Interventions   No data to display

## 2022-04-07 NOTE — Progress Notes (Signed)
PROGRESS NOTE    Carolyn [name]  PYK:[id number] DOB: 27-Mar-1955 DOA: 03/13/2022 PCP: Pcp, No   Brief Narrative: 67 year old female, past medical history of chronic pancreatitis, history of prior alcoholism, gastroesophageal reflux, prior vitamin D deficiency documented. She has known malabsorption issues and has been on Creon. She presents with cellulitis and sepsis related to a ongoing rash of the hands and feet. It appears that the rash has been going on for several weeks continued to get worse. As started off with a small blister the blister then ruptured the skin started to dry out crack and flake. She is starting to flake and desquamate portions of the hands and feet. She developed increased pain and swelling in the right hand that developed redness that tracks into the forearm. She presents to the ER with an elevated white count and was treated with antibiotics for concern of sepsis.  Patient admitted to critical care team subsequently transferred to [facility] service on 01/14/2021 after stabilization noted to be in significant pain, palliative care GI consulted. -Patient noted to have been transfused 2 units packed red blood cells given trending hemoglobin. -Patient seen by GI for further evaluation and also by palliative care who have adjusted pain medications patient started on IV Decadron with improvement with pain management. -Patient also noted to go into volume overload, placed on IV Lasix with good diuresis with adjustment in diuretics. -Patient will also with on calorie count which have improved TPN can be weaned down.  Assessment & Plan:   Principal Problem:   Severe sepsis ([place]) Active Problems:   Severe protein-calorie malnutrition (HCC)   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HCC)   Acute respiratory failure with hypoxia (HCC)   Chronic gastric ulcer with perforation (HCC)   Desquamative dermatitis   Sepsis ([place])   Cellulitis of left lower extremity   Hypophosphatemia   Anemia of  chronic disease   Localized swelling of right upper extremity   NSVT (nonsustained ventricular tachycardia) (HCC)   History of gastric ulcer   Abnormal loss of weight   Aspiration pneumonia of both lungs (HCC)   Acute on chronic diastolic CHF (congestive heart failure) (HCC)   Influenza A   #1 severe sepsis secondary to disqamative dermatitis with superimposed cellulitis and possible underlying osteomyelitis -Patient noted to have presented with complicated nonhealing desquamative dermatitis involving hands and feet complicated by severe sepsis due to cellulitis/wound infection. -MRI of bilateral hands and feet done without any definite evidence of osteomyelitis. -Patient initially admitted to the critical care service, concern for probable vitamin/nutritional deficiencies versus paraneoplastic syndromes. -Concern for possible nutritional deficiencies, B2, B6, tryptophan, zinc, thiamine etc. could lead to this. -Patient noted to be prior alcoholic now with a chronic pancreatitis on Creon and likely has pre-existing condition of malabsorption as well as possible gastric surgery unsure as to what the patient has had this on not however per PCCM. -Patient placed empirically on IV vancomycin, IV Rocephin and subsequently transition to doxycycline and Augmentin and just completed a 10-day course of antibiotic treatment 03/22/2022.  -Patient noted to go into acute hypoxic respiratory failure with concerns for possible aspiration pneumonia and volume overload and placed empirically on IV Unasyn to treat for total of 10 days. -Continue vitamin D3, vitamin B12, zinc, vitamin D3. -Continue vitamin A, selenium. -Will need follow-up with dermatology (we have followed in [name]) -Will need outpatient follow-up with podiatry for bone biopsy to evaluate for osteomyelitis. -Outpatient follow-up with ID was set up for 03/22/2022 however will need  to be rescheduled as patient is still  hospitalized. -Patient unable to ambulate seen by PT would likely require SNF placement. -Patient with ongoing pain, causing tachycardia, continue current regimen and IV Dilaudid as needed. -Continue current pain regimen as recommended by palliative care of Dilaudid 2 mg every 2 hours, fentanyl patch 25 mcg increased to 50 mcg and subsequently increased to 100 mcg/h every 72 hours on 03/31/2022.  Patient also noted on T3 and phenobarbital for anxiety at night.   -Due to drowsiness Fentanyl patch decreased to 50 mcg daily on 04/04/2022 per palliative care. -Patient also placed on Decadron 8 mg IV daily and wean down to 4 mg daily and recommended a long taper over several weeks per palliative care as well as aggressive diuresis and antibiotics with repeat of T3, T4 and cortisol level. -Patient being referred for celiac block by palliative care per IR 04/06/2022. -Palliative care managing pain regimen.  -Continue supportive care. -Palliative care following and appreciate input and recommendations.   2.  Acute hypoxic respiratory failure likely secondary to acute on chronic diastolic CHF exacerbation +/- aspiration pneumonia/influenza A -Patient noted to have a bout of nausea and emesis the evening of 03/22/2022, noted to have increased O2 requirements and required 5 L nasal cannula from being on room air the day prior. -Chest x-ray done with new bilateral patchy airspace opacities possibly due to multifocal infection or pulmonary edema, small bilateral pleural effusions. -BNP noted to be elevated at 3564.6 -Continue IV albumin to every 12 hours.  -Patient was placed on IV Lasix and currently on 60 mg IV every 12 hours patient with good urine output of 1.950 L over the past 24 hours.   -Current weight of 105.82 pounds.  -O2 sats improving currently 97% on RA.  -Respiratory viral panel positive for influenza A. -Started Tamiflu. -Strict I's and O's. -Daily weights. -Status post 10 days IV  Unasyn. -Continue Mucinex due to coarse breath sounds.     3.  History of chronic alcoholic pancreatitis -Continue Creon.    4.  Hepatic cirrhosis with ascites/transaminitis -Being followed in the outpatient setting by [facility]. -BP soft and as such diuretics held spironolactone and Lasix held early on during the hospitalization. -.CT Abdomen and Pelvis done and showed "Marked diffuse subcutaneous body wall edema. Mild ascites. The constellation of findings can be seen the setting of anasarca. Punctate free intraperitoneal gas within the upper abdomen adjacent to numerous surgical clips involving the distal stomach. A site of bowel perforation is not clearly identified and there is no leakage of oral contrast. Correlation for an exogenous source of gas, as can be seen with paracentesis, is recommended. Mild hepatic steatosis. Changes in keeping with chronic pancreatitis."  -Patient with no hepatic encephalopathy. -Patient currently on IV Lasix 60 mg every 12 hours due to concerns for volume overload with good diuresis. -As volume status improves will likely transition to oral Lasix and could likely at that time place also on spironolactone hopefully in the next 1 to 2 days.. -Outpatient follow-up with GI.  5.  Gastric chronic ulcer with contained perforation -Stable. 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   -Hemoglobin seems to be stabilizing.    -Continue PPI  twice daily, Pepcid twice daily.  -GI consulted, assessed patient, CT chest abdomen and pelvis obtained per GI which showed multi focal groundglass pulmonary infiltrates new from 03/15/2022 more prevalent  within the lung apices bilaterally possibly related to atypical infection in the acute setting, large right and small left pleural effusions, marked diffuse subcutaneous body wall edema mild ascites can be seen with anasarca.  Punctuate free intraperitoneal gas within the upper abdomen adjacent to numerous surgical clips involving the distal stomach.  A site of bowel perforation is not clearly identified and no leakage of contrast noted.  Correlate with an exogenous source of gas as can be seen with paracentesis recommended.  Hepatic steatosis.  Changes of chronic pancreatitis.  No definite superimposed acute peripancreatic inflammatory changes noted. -Patient assessed by GI who reviewed CT scans, discussed patient's prior surgeries and feel at this time patient does not need any further endoscopic evaluation and to follow for now. -Follow H&H, transfusion threshold hemoglobin  < 8.    6.  Hypothyroidism -TSH noted at 33.958 on 03/15/2022. -Continue Synthroid 50 mcg's daily.   -Will need repeat labs in 2-4 weeks in the outpatient setting.     6.  Hypophosphatemia/hypomagnesemia/hypokalemia -Magnesium at 2.4.  -Phosphorus at 5.9, potassium at 3.6 -Repeat labs in the AM.     7.  Severe protein calorie malnutrition -Patient seen by dietitian, PICC line placed and patient started on TPN per pharmacy. -Albumin level noted at< 1.5 -IV albumin decreased to every 12 hours. -TPN has been weaned off.   -Continue oral selenium, multivitamin, vitamin B6 100 mg daily, vitamin EE 400 units daily, vitamin A 10,000 units every other day.  -Dietitian following.   8.  Anemia of chronic disease/folate deficiency -Hemoglobin noted to go as low as 6.5, status post transfusion 1 unit packed red blood cells.   Hemoglobin noted at 7.1 on 03/20/2022.  Status posttransfusion 2 units packed red blood cells hemoglobin currently at 10.0. 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   -Hemoglobin seemed to be trickling back down.   -GI consulted for further evaluation and management to see if repeat EGD needed as well.   -CT chest abdomen and pelvis done on 03/24/2022 per GI recommendations with multi focal groundglass pulmonary infiltrates new from 03/15/2022 more prevalent within the lung apices bilaterally possibly related to atypical infection in the acute setting, large right and small left pleural effusions, marked diffuse subcutaneous body wall edema mild ascites can be seen with anasarca.  Punctuate free intraperitoneal gas within the upper abdomen adjacent to numerous surgical clips involving the distal stomach.  A site of bowel perforation is not clearly identified and no leakage of contrast noted.  Correlate with an exogenous source of gas as can be seen with paracentesis recommended.  Hepatic steatosis.  Changes of chronic pancreatitis.  No definite superimposed acute peripancreatic inflammatory changes noted. -Patient assessed by GI who reviewed CT scans, discussed patient's prior surgeries and feel at this time patient does not need any further endoscopic evaluation and to follow for now. -Hemoglobin noted to have stabilized at 10.0. -Follow H&H, transfusion threshold hemoglobin  < 8.    9.  Right upper extremity swelling and pain -Improved with upper extremity elevation and on diuretics.   -Upper extremity Dopplers negative for DVT.     10.  NSVT -Patient  noted to have 7 beat run of NSVT on 03/19/2022, patient was asymptomatic.   -Electrolytes repleted potassium at 4.0, magnesium at 2.1. -Hemoglobin noted at 7.1 on 03/20/2022, status post transfusion 3 units packed red blood cells during this hospitalization hemoglobin stable at 10.0.  -Repeat labs in the AM.   11.  Influenza A -Noted on respiratory viral panel. -Tamiflu.    Pressure Injury 01/07/22 Coccyx Mid Stage 1 -  Intact skin with non-blanchable redness of a localized area usually over a bony prominence. (Active)  01/07/22 1848  Location: Coccyx  Location Orientation: Mid  Staging: Stage 1 -  Intact skin with non-blanchable redness of a localized area usually over a bony prominence.  Wound Description (Comments):   Present on Admission: Yes  Dressing Type Foam - Lift dressing to assess site every shift 04/07/22 0800     Pressure Injury 03/15/22 Knee Anterior;Right Unstageable - Full thickness tissue loss in which the base of the injury is covered by slough (yellow, tan, gray, green or brown) and/or eschar (tan, brown or black) in the wound bed. blister on knee now open  (Active)  03/15/22   Location: Knee  Location Orientation: Anterior;Right  Staging: Unstageable - Full thickness tissue loss in which the base of the injury is covered by slough (yellow, tan, gray, green or brown) and/or eschar (tan, brown or black) in the wound bed.  Wound Description (Comments): blister on knee now open with black center  Present on Admission: Yes  Dressing Type Foam - Lift dressing to assess site every shift 04/05/22 0600      Nutrition Problem: Severe Malnutrition Etiology: chronic illness (chronic pancreatitis, h/o multiple gastric surgeries including gastric bypass)     Signs/Symptoms: severe fat depletion, severe muscle depletion    Interventions: Ensure Enlive (each supplement provides 350kcal and 20 grams of protein), MVI, Tube feeding, Prostat  Estimated body mass index is 16.53 kg/m as calculated from the following:   Height as of this encounter: 5\' 8"  (1.727 m).   Weight as of this encounter: 49.3 kg. DVT prophylaxis: Heparin Code Status: Full Family Communication: Updated patient.  No family at bedside.  Disposition: SNF.   Status is: Inpatient Remains inpatient appropriate because: Severity of illness.  Unsafe disposition.   Consultants:   PCCM admission. Infectious disease: Dr. 03/15/2022 Wound care RN 03/14/2022 Palliative care: Dr. 03/16/2022 03/23/2022 Gastroenterology: Dr. 05/22/2022 III 03/23/2022   Procedures:  Plain films of bilateral feet 03/13/2022 Chest x-ray 03/15/2022, 03/26/2022, 03/28/2022, 03/30/2022, 03/31/2022 MRI left foot and right foot 03/13/2022 PICC line placement 03/17/2022 Transfused 2 units packed red blood cells 03/20/2022 Transfuse 1 unit packed red blood cells 03/15/2022 Right upper extremity Dopplers 03/19/2022 CT chest abdomen and pelvis 03/24/2022 Acute abdominal series 03/23/2022    Subjective:resting in bed  On RA In nad  C/o diarrhea   Objective: Vitals:   04/07/22 0800 04/07/22 0900 04/07/22 1100 04/07/22 1200  BP: (!) 155/90 (!) 126/94 129/75 (!) 143/87  Pulse: 76 83 87 78  Resp: 13 12 12 13   Temp:      TempSrc:      SpO2: 99% 99% 100% 100%  Weight:      Height:        Intake/Output Summary (Last 24 hours) at 04/07/2022 1442 Last data filed at 04/07/2022 0925 Gross per 24 hour  Intake 240 ml  Output 2500 ml  Net -2260 ml   Filed Weights   04/04/22 0353 04/05/22 0500 04/07/22 0500  Weight: 48.1 kg 48  kg 49.3 kg    Examination:frail ill appearing   General exam: Appears in nad Respiratory system: Coarse  to auscultation. Respiratory effort normal. Cardiovascular system: S1 & S2 heard, RRR. No JVD, murmurs, rubs, gallops or clicks. No pedal edema. Gastrointestinal system: Abdomen is nondistended, soft and nontender. No organomegaly or masses felt. Normal bowel sounds heard. Central nervous system: Alert and oriented. No focal neurological deficits. Extremities: edema. Skin: Decreased erythema right upper extremity, severe desquamative dermatitis changes right upper extremity bilateral feet.  Psychiatry: Judgement and insight appear normal. Mood & affect appropriate.     Data Reviewed: I have personally reviewed following labs and imaging studies  CBC: Recent Labs  Lab  04/01/22 1020 04/02/22 0429 04/03/22 0229 04/05/22 0357 04/06/22 0338 04/07/22 0242  WBC 13.9* 16.2* 16.8* 10.9* 11.4* 9.7  NEUTROABS 10.9*  --   --  8.9*  --  8.0*  HGB 9.3* 9.4* 9.2* 9.8* 10.0* 10.2*  HCT 29.9* 29.9* 29.4* 30.6* 31.3* 31.6*  MCV 100.7* 99.0 100.0 95.9 94.6 94.0  PLT 273 271 286 224 234 443   Basic Metabolic Panel: Recent Labs  Lab 04/03/22 0229 04/04/22 0500 04/05/22 0357 04/06/22 0338 04/07/22 0242  NA 134* 138 135 134* 133*  K 3.8 3.9 3.5 4.0 3.6  CL 94* 98 94* 94* 93*  CO2 26 28 25 27 26   GLUCOSE 152* 129* 112* 70 179*  BUN 62* 75* 75* 71* 84*  CREATININE 0.73 0.74 0.75 0.72 0.72  CALCIUM 9.2 9.5 9.8 9.7 10.1  MG 2.2 2.4 2.4 2.1 2.4  PHOS 4.6 6.1* 5.8* 4.6 5.9*   GFR: Estimated Creatinine Clearance: 55.3 mL/min (by C-G formula based on SCr of 0.72 mg/dL). Liver Function Tests: Recent Labs  Lab 04/01/22 0322 04/02/22 0429 04/03/22 0229 04/05/22 0357 04/06/22 0338 04/07/22 0242  AST 34  --  33 42*  --  32  ALT 32  --  28 32  --  31  ALKPHOS 90  --  120 107  --  98  BILITOT 0.5  --  0.5 0.8  --  0.7  PROT 6.4*  --  6.7 7.9  --  8.2*  ALBUMIN 3.6 4.3 4.6 4.8 4.6 5.4*   No results for input(s): "LIPASE", "AMYLASE" in the last 168 hours. No results for input(s): "AMMONIA" in the last 168 hours. Coagulation Profile: No results for input(s): "INR", "PROTIME" in the last 168 hours. Cardiac Enzymes: No results for input(s): "CKTOTAL", "CKMB", "CKMBINDEX", "TROPONINI" in the last 168 hours. BNP (last 3 results) No results for input(s): "PROBNP" in the last 8760 hours. HbA1C: Recent Labs    04/07/22 0242  HGBA1C 5.3   CBG: Recent Labs  Lab 04/06/22 1337 04/06/22 1702 04/06/22 2100 04/07/22 0733 04/07/22 1117  GLUCAP 142* 209* 367* 167* 382*   Lipid Profile: No results for input(s): "CHOL", "HDL", "LDLCALC", "TRIG", "CHOLHDL", "LDLDIRECT" in the last 72 hours. Thyroid Function Tests: No results for input(s): "TSH", "T4TOTAL",  "FREET4", "T3FREE", "THYROIDAB" in the last 72 hours. Anemia Panel: No results for input(s): "VITAMINB12", "FOLATE", "FERRITIN", "TIBC", "IRON", "RETICCTPCT" in the last 72 hours. Sepsis Labs: No results for input(s): "PROCALCITON", "LATICACIDVEN" in the last 168 hours.  Recent Results (from the past 240 hour(s))  Respiratory (~20 pathogens) panel by PCR     Status: Abnormal   Collection Time: 04/05/22 11:26 AM   Specimen: Nasopharyngeal Swab; Respiratory  Result Value Ref Range Status   Adenovirus NOT DETECTED NOT DETECTED Final   Coronavirus 229E NOT DETECTED NOT  DETECTED Final    Comment: (NOTE) The Coronavirus on the Respiratory Panel, DOES NOT test for the novel  Coronavirus (2019 nCoV)    Coronavirus HKU1 NOT DETECTED NOT DETECTED Final   Coronavirus NL63 NOT DETECTED NOT DETECTED Final   Coronavirus OC43 NOT DETECTED NOT DETECTED Final   Metapneumovirus NOT DETECTED NOT DETECTED Final   Rhinovirus / Enterovirus NOT DETECTED NOT DETECTED Final   Influenza A H1 2009 DETECTED (A) NOT DETECTED Final   Influenza B NOT DETECTED NOT DETECTED Final   Parainfluenza Virus 1 NOT DETECTED NOT DETECTED Final   Parainfluenza Virus 2 NOT DETECTED NOT DETECTED Final   Parainfluenza Virus 3 NOT DETECTED NOT DETECTED Final   Parainfluenza Virus 4 NOT DETECTED NOT DETECTED Final   Respiratory Syncytial Virus NOT DETECTED NOT DETECTED Final   Bordetella pertussis NOT DETECTED NOT DETECTED Final   Bordetella Parapertussis NOT DETECTED NOT DETECTED Final   Chlamydophila pneumoniae NOT DETECTED NOT DETECTED Final   Mycoplasma pneumoniae NOT DETECTED NOT DETECTED Final    Comment: Performed at [facility], [address], [place], [place] [address]  Group A Strep by PCR     Status: None   Collection Time: 04/05/22 11:27 AM   Specimen: Throat; Sterile Swab  Result Value Ref Range Status   Group A Strep by PCR NOT DETECTED NOT DETECTED Final    Comment: Performed at [facility], [address], [place], [place] [address]         Radiology Studies: CT CELIAC PLEXUS BLOCK ANESTHETIC  Result Date: 04/06/2022 INDICATION: Chronic pancreatitis.  Intractable abdominal pain. EXAM: CT-GUIDED CELIAC PLEXUS NERVE BLOCK, DIAGNOSTIC AND THERAPEUTIC COMPARISON:  None Available. MEDICATIONS: 80 mg Kenalog and 30 mL 0.5% bupivacaine. ANESTHESIA/SEDATION: Moderate (conscious) sedation was employed during this procedure. A total of Versed 2 mg and Fentanyl 100 mcg was administered intravenously. Moderate Sedation Time: 37 minutes. The patient's level of consciousness and vital signs were monitored continuously by radiology nursing throughout the procedure under my direct supervision. CONTRAST:  1 mL Omnipaque 300 FLUOROSCOPY TIME:  CT dose in mGy was not provided. COMPLICATIONS: None immediate. PROCEDURE: RADIATION DOSE REDUCTION: This exam was performed according to the departmental dose-optimization program which includes automated exposure control, adjustment of the mA and/or kV according to patient size and/or use of iterative reconstruction technique. Informed consent was obtained from the patient following an explanation of the procedure, risks, benefits and alternatives. A time out was performed prior to the initiation of the procedure. The patient was positioned prone on the CT table and a limited CT was performed for procedural planning demonstrating an adequate window at the upper abdomen. The procedure was planned. A timeout was performed prior to the initiation of the procedure. The operative site was prepped and draped in the usual sterile fashion. Appropriate trajectory was confirmed with a 22 gauge spinal needle after the adjacent tissues were anesthetized with 1% Lidocaine with epinephrine. Under intermittent CT guidance, 15 cm 21-gauge Chiba needles were inserted via a posterior approach and the needle tips were positioned in the retroperitoneal space immediately  anterolateral to the aorta, at the region of the celiac trunk. A small amount of dilute contrast was injected through the needles to confirm position. A solution containing 2 mL of 40 mg/mL Kenalog and 15 mL of Bupivacaine was mixed and injected through each needle. Intermittent CT scanning confirmed appropriate spread into the extra vascular spaces. The needles were removed and hemostasis was achieved with manual  compression. A limited postprocedural CT was negative for hemorrhage or additional complication. A dressing was placed. The patient tolerated the procedure well without immediate postprocedural complication. IMPRESSION: Successful CT-guided diagnostic and therapeutic celiac plexus nerve block, as above. PLAN: The patient will be re-evaluated in the postprocedural setting and again in 3-6 months to assess the efficacy of this nerve block. If successful, the patient may be a candidate for future celiac plexus nerve blocks versus neurolysis. Roanna Banning, MD [facility] Electronically Signed   By: Roanna Banning M.D.   On: 04/06/2022 17:44        Scheduled Meds:  (feeding supplement) PROSource Plus  30 mL Oral BID BM   acetaminophen  1,000 mg Oral TID   bupivacaine(PF)  30 mL Infiltration Once   Chlorhexidine Gluconate Cloth  6 each Topical Daily   cholecalciferol  1,000 Units Oral Daily   dexamethasone (DECADRON) injection  4 mg Intravenous Q24H   dextromethorphan-guaiFENesin  1 tablet Oral BID   enoxaparin (LOVENOX) injection  40 mg Subcutaneous Q24H   famotidine  10 mg Oral BID   feeding supplement  237 mL Oral TID BM   fentaNYL  1 patch Transdermal Q72H   fluconazole  200 mg Oral Daily   furosemide  60 mg Intravenous Q12H   gabapentin  300 mg Oral TID   insulin aspart  0-15 Units Subcutaneous TID WC   levothyroxine  50 mcg Oral Q0600   liothyronine  25 mcg Oral Daily   lipase/protease/amylase  24,000 Units Oral TID WC    metoprolol tartrate  25 mg Oral BID   multivitamin with minerals  1 tablet Oral Daily   mupirocin cream   Topical Once   oseltamivir  30 mg Oral BID   pantoprazole  40 mg Oral BID   phenobarbital  16.2 mg Oral QHS   pyridOXINE  100 mg Oral Daily   selenium  100 mcg Oral Daily   silver sulfADIAZINE   Topical Daily   sodium chloride flush  10-40 mL Intracatheter Q12H   vitamin A  10,000 Units Oral Q48H   vitamin B-12  100 mcg Oral Daily   vitamin E  400 Units Oral Daily   Continuous Infusions:  albumin human 12.5 g (04/07/22 0934)     LOS: 24 days    Time spent:3 7 min  Alwyn Ren, MD  04/07/2022, 2:42 PM

## 2022-04-07 NOTE — Progress Notes (Signed)
Physical Therapy Treatment Patient Details Name: Carolyn [name] MRN: [id number] DOB: 27-May-1955 Today's Date: 04/07/2022   History of Present Illness 67 year old female with history of chronic pancreatitis, chronic pain syndrome, GERD, severe malnutrition, osteoporosis who presented to [facility] ED with LE "cellulitis due to breakdown from desquammating wounds" involving bil feet and to lesser extent hands.  Pt admitted 03/13/22 for severe sepsis secondary to discriminative dermatitis with superimposed cellulitis and possible underlying osteomyelitis    PT Comments    The patient  is eager to work on mobility.  Patient assisted to sitting then standing at RW with +2  mod steady assistance. Patient able to bear weight, take a few steps in place and stepped   x 1 forward and backward. Seated rest  break, then stood again, had incontinence of BM, able to step to [facility] then back to bed with mod assistance.  Patient is making progress  with mobility.   Recommendations for follow up therapy are one component of a multi-disciplinary discharge planning process, led by the attending physician.  Recommendations may be updated based on patient status, additional functional criteria and insurance authorization.  Follow Up Recommendations  Skilled nursing-short term rehab (<3 hours/day) Can patient physically be transported by private vehicle: No   Assistance Recommended at Discharge Frequent or constant Supervision/Assistance  Patient can return home with the following Two people to help with walking and/or transfers;A lot of help with bathing/dressing/bathroom;Assistance with cooking/housework;Assist for transportation;Help with stairs or ramp for entrance   Equipment Recommendations  None recommended by PT    Recommendations for Other Services       Precautions / Restrictions Precautions Precaution Comments: incontinent b/B, frequent loose BM upon standing     Mobility  Bed Mobility Overal bed  mobility: Needs Assistance Bed Mobility: Supine to Sit     Supine to sit: Min assist Sit to supine: Min guard   General bed mobility comments: used bedrail, HOB up, patient able  to roll and move legs tobed edge, mod assist to scoot with bed pad., posterior bias    Transfers Overall transfer level: Needs assistance Equipment used: Rolling walker (2 wheels) Transfers: Bed to chair/wheelchair/BSC   Stand pivot transfers: Mod assist, +2 safety/equipment         General transfer comment: patient stands at Rw and with mod steady assist to power up from bed and BSC,  Step pivot BSC and back to bed with mod assistnace for balance, bearing weight through legs.    Ambulation/Gait Ambulation/Gait assistance: Mod assist, +2 safety/equipment, +2 physical assistance   Assistive device: Rolling walker (2 wheels) Gait Pattern/deviations: Step-to pattern       General Gait Details: able to step in place x 5, decreased step with RLE, did take 1 step forward and then 1 backward.   Stairs             Wheelchair Mobility    Modified Rankin (Stroke Patients Only)       Balance Overall balance assessment: Needs assistance Sitting-balance support: Bilateral upper extremity supported, Feet supported Sitting balance-Leahy Scale: Poor Sitting balance - Comments: posterior lean, able to lean forward to catch self Postural control: Posterior lean Standing balance support: Bilateral upper extremity supported, During functional activity, Reliant on assistive device for balance Standing balance-Leahy Scale: Poor                              Cognition Arousal/Alertness: Awake/alert Behavior During Therapy:  WFL for tasks assessed/performed Overall Cognitive Status: Within Functional Limits for tasks assessed                                          Exercises      General Comments        Pertinent Vitals/Pain Pain Assessment Pain Assessment:  Faces Faces Pain Scale: Hurts little more Pain Location: bilateral feet and hands, periarea Pain Descriptors / Indicators: Tender, Discomfort, Burning Pain Intervention(s): Monitored during session, Repositioned    Home Living                          Prior Function            PT Goals (current goals can now be found in the care plan section) Progress towards PT goals: Progressing toward goals    Frequency    Min 2X/week      PT Plan Current plan remains appropriate    Co-evaluation              AM-PAC PT "6 Clicks" Mobility   Outcome Measure  Help needed turning from your back to your side while in a flat bed without using bedrails?: A Little Help needed moving from lying on your back to sitting on the side of a flat bed without using bedrails?: A Lot Help needed moving to and from a bed to a chair (including a wheelchair)?: A Lot Help needed standing up from a chair using your arms (e.g., wheelchair or bedside chair)?: A Lot Help needed to walk in hospital room?: Total Help needed climbing 3-5 steps with a railing? : Total 6 Click Score: 11    End of Session Equipment Utilized During Treatment: Gait belt Activity Tolerance: Patient tolerated treatment well;Patient limited by fatigue Patient left: in bed;with call bell/phone within reach Nurse Communication: Mobility status;Other (comment) PT Visit Diagnosis: Difficulty in walking, not elsewhere classified (R26.2) Pain - part of body: Hand;Ankle and joints of foot;Leg     Time: 9323-5573 PT Time Calculation (min) (ACUTE ONLY): 23 min  Charges:  $Therapeutic Activity: 8-22 mins $Self Care/Home Management: Lena Office [phone] Weekend [phone]    Claretha Cooper 04/07/2022, 11:03 AM

## 2022-04-07 NOTE — Inpatient Diabetes Management (Addendum)
Inpatient Diabetes Program Recommendations  AACE/ADA: New Consensus Statement on Inpatient Glycemic Control (2015)  Target Ranges:  Prepandial:   less than 140 mg/dL      Peak postprandial:   less than 180 mg/dL (1-2 hours)      Critically ill patients:  140 - 180 mg/dL   Lab Results  Component Value Date   GLUCAP 382 (H) 04/07/2022   HGBA1C 5.3 04/07/2022    Review of Glycemic Control  Latest Reference Range & Units 04/06/22 17:02 04/06/22 21:00 04/07/22 07:33 04/07/22 11:17  Glucose-Capillary 70 - 99 mg/dL 209 (H) 367 (H) 167 (H) 382 (H)  (H): Data is abnormally high Diabetes history: none noted Current orders for Inpatient glycemic control: Novolog 0-15 units TID Decadron 4 mg QD Inpatient Diabetes Program Recommendations:     In the setting of steroids, consider: -Semglee 6 units QD -Novolog 3 units TID (assuming patient is consuming >50% of meals) -Question if we could change Ensure to lower CHO supplement? -carb modified diet if appropriate  Thanks, Bronson Curb, MSN, RNC-OB Diabetes Coordinator [phone] (8a-5p)

## 2022-04-07 NOTE — NC FL2 (Signed)
[place] MEDICAID FL2 LEVEL OF CARE FORM     IDENTIFICATION  Patient Name: Carolyn [name] Birthdate: [birth year] Sex: female Admission Date (Current Location): 03/13/2022  [facility] and [place] Number:  Producer, television/film/video and Address:  [facility],  [address] [place], [place] [address]      Provider Number: [id number]  Attending Physician Name and Address:  Alwyn Ren, MD  Relative Name and Phone Number:       Current Level of Care: Hospital Recommended Level of Care: Skilled Nursing Facility Prior Approval Number:    Date Approved/Denied:   PASRR Number: [id number]  Discharge Plan: SNF    Current Diagnoses: Patient Active Problem List   Diagnosis Date Noted   Influenza A 04/06/2022   History of gastric ulcer 03/23/2022   Abnormal loss of weight 03/23/2022   Aspiration pneumonia of both lungs (HCC) 03/23/2022   Acute on chronic diastolic CHF (congestive heart failure) (HCC) 03/23/2022   NSVT (nonsustained ventricular tachycardia) (HCC) 03/20/2022   Localized swelling of right upper extremity 03/19/2022   Cellulitis of left lower extremity 03/17/2022   Hypophosphatemia 03/17/2022   Anemia of chronic disease 03/17/2022   Sepsis (HCC) 03/14/2022   Severe sepsis (HCC) 03/13/2022   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   Abnormal CT of the abdomen 01/19/2022   Chronic gastric ulcer with perforation (HCC) 01/19/2022   Alcoholic cirrhosis of liver without ascites (HCC) 01/18/2022   Occult blood in stools 01/18/2022   Portal hypertensive gastropathy (HCC) 01/18/2022   Anasarca 01/12/2022   Acute respiratory failure with hypoxia (HCC) 01/11/2022   Hypoalbuminemia 01/11/2022   Pleural effusion 01/10/2022   Abdominal distention 01/10/2022   Physical deconditioning 01/09/2022   Pressure injury of skin 01/08/2022   Hypokalemia 01/07/2022   Hypomagnesemia 01/07/2022   AKI (acute kidney injury) (HCC) 01/07/2022   Severe protein-calorie malnutrition  (HCC) 01/07/2022   Cellulitis of right foot 01/07/2022   Normocytic anemia 01/07/2022   Osteomyelitis (HCC) 01/06/2022   Intractable nausea and vomiting 06/13/2020   Metabolic acidosis, increased anion gap 06/12/2020   Seizure (HCC) 05/08/2017   Chronic pain syndrome 05/08/2017   Gastroesophageal reflux disease without esophagitis 05/08/2017   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HCC) 05/08/2017   Pancreatic steatorrhea 05/08/2017   Osteoporosis 03/16/2011   Endometriosis    Malabsorption    Chronic pancreatitis (HCC)    Vitamin D deficiency     Orientation RESPIRATION BLADDER Height & Weight     Self, Time, Situation, Place  Normal Continent Weight: 49.3 kg Height:  5\' 8"  (172.7 cm)  BEHAVIORAL SYMPTOMS/MOOD NEUROLOGICAL BOWEL NUTRITION STATUS      Continent Diet (regular)  AMBULATORY STATUS COMMUNICATION OF NEEDS Skin   Extensive Assist Verbally Other (Comment) (rash with blisters and cracking to both bottom of feet.  rash to hands.)                       Personal Care Assistance Level of Assistance  Bathing, Feeding, Dressing Bathing Assistance: Limited assistance Feeding assistance: Limited assistance Dressing Assistance: Limited assistance     Functional Limitations Info  Sight, Hearing, Speech Sight Info: Adequate Hearing Info: Adequate Speech Info: Adequate    SPECIAL CARE FACTORS FREQUENCY  PT (By licensed PT), OT (By licensed OT)     PT Frequency: 5 x weekly OT Frequency: 5 x weekly            Contractures Contractures Info: Not present    Additional Factors Info  Code Status Code  Status Info: full             Current Medications (04/07/2022):  This is the current hospital active medication list Current Facility-Administered Medications  Medication Dose Route Frequency Provider Last Rate Last Admin   (feeding supplement) PROSource Plus liquid 30 mL  30 mL Oral BID BM Hunsucker, Bonna Gains, MD   30 mL at 04/07/22 0924   acetaminophen (TYLENOL)  tablet 1,000 mg  1,000 mg Oral TID Eugenie Filler, MD   1,000 mg at 04/07/22 8546   acetaminophen (TYLENOL) tablet 650 mg  650 mg Oral Q4H PRN Icard, Bradley L, DO   650 mg at 03/19/22 1055   albumin human 25 % solution 12.5 g  12.5 g Intravenous Q12H Eugenie Filler, MD 60 mL/hr at 04/06/22 2225 12.5 g at 04/06/22 2225   bupivacaine(PF) (MARCAINE) 0.5 % injection 30 mL  30 mL Infiltration Once Jacqualine Mau, NP       Chlorhexidine Gluconate Cloth 2 % PADS 6 each  6 each Topical Daily Eugenie Filler, MD   6 each at 04/07/22 0926   cholecalciferol (VITAMIN D3) 25 MCG (1000 UNIT) tablet 1,000 Units  1,000 Units Oral Daily Icard, Bradley L, DO   1,000 Units at 04/07/22 0925   dexamethasone (DECADRON) injection 4 mg  4 mg Intravenous Q24H Lane Hacker L, DO   4 mg at 04/06/22 1405   dextromethorphan-guaiFENesin ([place] DM) 30-600 MG per 12 hr tablet 1 tablet  1 tablet Oral BID Eugenie Filler, MD   1 tablet at 04/07/22 2703   docusate sodium (COLACE) capsule 100 mg  100 mg Oral BID PRN Icard, Leory Plowman L, DO       enoxaparin (LOVENOX) injection 40 mg  40 mg Subcutaneous Q24H Sheikh, Georgina Quint Latif, DO   40 mg at 04/06/22 2200   famotidine (PEPCID) tablet 10 mg  10 mg Oral BID Carlisle Cater, PA-C   10 mg at 04/07/22 0925   feeding supplement (ENSURE ENLIVE / ENSURE PLUS) liquid 237 mL  237 mL Oral TID BM Zada Finders R, MD   237 mL at 04/06/22 2015   fentaNYL (DURAGESIC) 50 MCG/HR 1 patch  1 patch Transdermal Q72H Lane Hacker L, DO   1 patch at 04/04/22 2116   fluconazole (DIFLUCAN) tablet 200 mg  200 mg Oral Daily Emiliano Dyer, RPH   200 mg at 04/07/22 5009   furosemide (LASIX) injection 60 mg  60 mg Intravenous Q12H Eugenie Filler, MD   60 mg at 04/07/22 3818   gabapentin (NEURONTIN) capsule 300 mg  300 mg Oral TID Eugenie Filler, MD   300 mg at 04/07/22 2993   hydrALAZINE (APRESOLINE) injection 10 mg  10 mg Intravenous Q6H PRN Raiford Noble Latif, DO   10  mg at 03/25/22 1155   HYDROmorphone (DILAUDID) injection 1 mg  1 mg Intravenous Q2H PRN Lane Hacker L, DO   1 mg at 04/07/22 0418   HYDROmorphone (DILAUDID) tablet 4 mg  4 mg Oral Q4H PRN Lane Hacker L, DO   4 mg at 04/04/22 0834   insulin aspart (novoLOG) injection 0-15 Units  0-15 Units Subcutaneous TID WC Eugenie Filler, MD   3 Units at 04/07/22 0758   ipratropium-albuterol (DUONEB) 0.5-2.5 (3) MG/3ML nebulizer solution 3 mL  3 mL Nebulization Q4H PRN Kathryne Eriksson, NP   3 mL at 03/25/22 1749   levothyroxine (SYNTHROID) tablet 50 mcg  50 mcg Oral Q0600 Eugenie Filler,  MD   50 mcg at 04/07/22 0612   liothyronine (CYTOMEL) tablet 25 mcg  25 mcg Oral Daily Acquanetta Chain, DO   25 mcg at 04/07/22 5102   lip balm (CARMEX) ointment   Topical PRN Eugenie Filler, MD   Given at 03/24/22 2000   lipase/protease/amylase (CREON) capsule 24,000 Units  24,000 Units Oral TID WC Lenore Cordia, MD   24,000 Units at 04/07/22 0758   metoprolol tartrate (LOPRESSOR) tablet 25 mg  25 mg Oral BID Eugenie Filler, MD   25 mg at 04/07/22 5852   multivitamin with minerals tablet 1 tablet  1 tablet Oral Daily Eugenie Filler, MD   1 tablet at 04/07/22 7782   mupirocin cream (BACTROBAN) 2 %   Topical Once Beatty, Celeste A, PA-C       ondansetron (ZOFRAN) injection 4 mg  4 mg Intravenous Q6H PRN Carlisle Cater, PA-C   4 mg at 04/03/22 1604   oseltamivir (TAMIFLU) capsule 30 mg  30 mg Oral BID Eugenie Filler, MD   30 mg at 04/07/22 0924   pantoprazole (PROTONIX) EC tablet 40 mg  40 mg Oral BID Karren Cobble, RPH   40 mg at 04/07/22 4235   PHENobarbital (LUMINAL) tablet 16.2 mg  16.2 mg Oral QHS Lane Hacker L, DO   16.2 mg at 04/06/22 2200   polyethylene glycol (MIRALAX / GLYCOLAX) packet 17 g  17 g Oral Daily PRN Icard, Bradley L, DO   17 g at 03/19/22 2146   pyridOXINE (VITAMIN B6) tablet 100 mg  100 mg Oral Daily Eugenie Filler, MD   100 mg at 04/07/22 3614    selenium tablet 100 mcg  100 mcg Oral Daily Eugenie Filler, MD   100 mcg at 04/07/22 4315   silver sulfADIAZINE (SILVADENE) 1 % cream   Topical Daily [place] Eric J, DO   Given at 04/07/22 4008   sodium chloride flush (NS) 0.9 % injection 10-40 mL  10-40 mL Intracatheter Q12H Eugenie Filler, MD   10 mL at 04/06/22 2206   sodium chloride flush (NS) 0.9 % injection 10-40 mL  10-40 mL Intracatheter PRN Eugenie Filler, MD   10 mL at 03/24/22 1735   vitamin A capsule 10,000 Units  10,000 Units Oral Q48H Eugenie Filler, MD   10,000 Units at 04/05/22 2148   vitamin B-12 (CYANOCOBALAMIN) tablet 100 mcg  100 mcg Oral Daily Icard, Bradley L, DO   100 mcg at 04/07/22 6761   vitamin E capsule 400 Units  400 Units Oral Daily Eugenie Filler, MD   400 Units at 04/06/22 9509     Discharge Medications: Please see discharge summary for a list of discharge medications.  Relevant Imaging Results:  Relevant Lab Results:   Additional Information SSN [id number]  Leeroy Cha, RN

## 2022-04-07 NOTE — Progress Notes (Signed)
Palliative Care Progress Note  Concerns yesterday afternoon about sedation secondary to opioid titration-orders given to change duragesic to 55mcg dosing with oral hydromorphone 4mg  for breakthrough pain. Patient is awake alert, Carolyn [name] complains of new onset sore throat, nasal congestion and weakness. Otherwise no complaints. Carolyn [name] continues to have abdominal pain secondary to chronic pancreatitis- slightly worse after resuming PO intake. Pain due to skin breakdown  especially on buttocks and bilateral hands and wound on bottom of her feet. On exam Carolyn [name] has some white patches on her posterior pharynx but otherwise her oral mucosa looks normal. Low grade temp 99-100 yesterday per vitals.  Recommendations:  Empirically treat for thrush-Carolyn [name] is on decadron and antibiotics so Carolyn [name] is high risk for fungal infection.Fluconazole per pharmacy-assess for clinical improvement Respiratory panel given new congestion and sore throat IR Consult for Celiac plexus block-Carolyn [name] has never had interventional pain modalities for this and would benefit from any intervention that could lower her overall opioid requirements. Continue current opioid regimen, will need to assist with plan for outpatient pain management -ideally through palliative care given chronic diasease burden and ongoing support needs. I recommended evaluation by CIR for rehabilitation-Carolyn [name] has capacity for recovery and goal is to return home as soon as possible. Carolyn [name] is motivated. Severe deconditioning-ankle fracture and possible osteomyelitis limiting ability to walk. Her husband Carolyn [name] is her primary support. Will continue to follow for symptom management and care coordination needs. Will need ongoing nutritional supplementation and support given history of Anorexia and disordered eating. Needs ongoing support and treatment for alcohol use disorder.  Carolyn Hacker, DO Palliative Medicine  Time: 35 minutes

## 2022-04-08 DIAGNOSIS — A419 Sepsis, unspecified organism: Secondary | ICD-10-CM | POA: Diagnosis not present

## 2022-04-08 DIAGNOSIS — R6520 Severe sepsis without septic shock: Secondary | ICD-10-CM | POA: Diagnosis not present

## 2022-04-08 LAB — GLUCOSE, CAPILLARY
Glucose-Capillary: 362 mg/dL — ABNORMAL HIGH (ref 70–99)
Glucose-Capillary: 99 mg/dL (ref 70–99)
Glucose-Capillary: 346 mg/dL — ABNORMAL HIGH (ref 70–99)
Glucose-Capillary: 261 mg/dL — ABNORMAL HIGH (ref 70–99)

## 2022-04-08 MED ORDER — INSULIN GLARGINE-YFGN 100 UNIT/ML ~~LOC~~ SOLN
12.0000 [IU] | Freq: Every day | SUBCUTANEOUS | Status: DC
  Administered 2022-04-09 – 2022-04-10 (×2): 12 [IU] via SUBCUTANEOUS
  Filled 2022-04-08 (×2): qty 0.12

## 2022-04-08 NOTE — Progress Notes (Signed)
  Inpatient Rehabilitation Admissions Coordinator   Rehab reconsult today. We signed off on 1/18 for recommendation is for SNF by therapy and TOC is pursuing SNF. We will sign off at this time.  Danne Baxter, RN, MSN Rehab Admissions Coordinator [phone] 04/08/2022 10:31 AM

## 2022-04-08 NOTE — Progress Notes (Signed)
PROGRESS NOTE    Carolyn [name]  PYK:[id number] DOB: 27-Mar-1955 DOA: 03/13/2022 PCP: Pcp, No   Brief Narrative: 67 year old female, past medical history of chronic pancreatitis, history of prior alcoholism, gastroesophageal reflux, prior vitamin D deficiency documented. She has known malabsorption issues and has been on Creon. She presents with cellulitis and sepsis related to a ongoing rash of the hands and feet. It appears that the rash has been going on for several weeks continued to get worse. As started off with a small blister the blister then ruptured the skin started to dry out crack and flake. She is starting to flake and desquamate portions of the hands and feet. She developed increased pain and swelling in the right hand that developed redness that tracks into the forearm. She presents to the ER with an elevated white count and was treated with antibiotics for concern of sepsis.  Patient admitted to critical care team subsequently transferred to [facility] service on 01/14/2021 after stabilization noted to be in significant pain, palliative care GI consulted. -Patient noted to have been transfused 2 units packed red blood cells given trending hemoglobin. -Patient seen by GI for further evaluation and also by palliative care who have adjusted pain medications patient started on IV Decadron with improvement with pain management. -Patient also noted to go into volume overload, placed on IV Lasix with good diuresis with adjustment in diuretics. -Patient will also with on calorie count which have improved TPN can be weaned down.  Assessment & Plan:   Principal Problem:   Severe sepsis ([place]) Active Problems:   Severe protein-calorie malnutrition (HCC)   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HCC)   Acute respiratory failure with hypoxia (HCC)   Chronic gastric ulcer with perforation (HCC)   Desquamative dermatitis   Sepsis ([place])   Cellulitis of left lower extremity   Hypophosphatemia   Anemia of  chronic disease   Localized swelling of right upper extremity   NSVT (nonsustained ventricular tachycardia) (HCC)   History of gastric ulcer   Abnormal loss of weight   Aspiration pneumonia of both lungs (HCC)   Acute on chronic diastolic CHF (congestive heart failure) (HCC)   Influenza A   #1 severe sepsis secondary to disqamative dermatitis with superimposed cellulitis and possible underlying osteomyelitis -Patient noted to have presented with complicated nonhealing desquamative dermatitis involving hands and feet complicated by severe sepsis due to cellulitis/wound infection. -MRI of bilateral hands and feet done without any definite evidence of osteomyelitis. -Patient initially admitted to the critical care service, concern for probable vitamin/nutritional deficiencies versus paraneoplastic syndromes. -Concern for possible nutritional deficiencies, B2, B6, tryptophan, zinc, thiamine etc. could lead to this. -Patient noted to be prior alcoholic now with a chronic pancreatitis on Creon and likely has pre-existing condition of malabsorption as well as possible gastric surgery unsure as to what the patient has had this on not however per PCCM. -Patient placed empirically on IV vancomycin, IV Rocephin and subsequently transition to doxycycline and Augmentin and just completed a 10-day course of antibiotic treatment 03/22/2022.  -Patient noted to go into acute hypoxic respiratory failure with concerns for possible aspiration pneumonia and volume overload and placed empirically on IV Unasyn to treat for total of 10 days. -Continue vitamin D3, vitamin B12, zinc, vitamin D3. -Continue vitamin A, selenium. -Will need follow-up with dermatology (we have followed in [name]) -Will need outpatient follow-up with podiatry for bone biopsy to evaluate for osteomyelitis. -Outpatient follow-up with ID was set up for 03/22/2022 however will need  to be rescheduled as patient is still  hospitalized. -Patient unable to ambulate seen by PT would likely require SNF placement. -Patient with ongoing pain, causing tachycardia, continue current regimen and IV Dilaudid as needed. -Continue current pain regimen as recommended by palliative care of Dilaudid 2 mg every 2 hours, fentanyl patch 25 mcg increased to 50 mcg and subsequently increased to 100 mcg/h every 72 hours on 03/31/2022.  Patient also noted on T3 and phenobarbital for anxiety at night.   -Due to drowsiness Fentanyl patch decreased to 50 mcg daily on 04/04/2022 per palliative care. -Patient also placed on Decadron 8 mg IV daily and wean down to 4 mg daily and recommended a long taper over several weeks per palliative care as well as aggressive diuresis and antibiotics with repeat of T3, T4 and cortisol level. -Patient being referred for celiac block by palliative care per IR 04/06/2022. -Palliative care managing pain regimen.  -Continue supportive care. -Palliative care following and appreciate input and recommendations.  AWAIT SNF  2.  Acute hypoxic respiratory failure likely secondary to acute on chronic diastolic CHF exacerbation +/- aspiration pneumonia/influenza A -Patient noted to have a bout of nausea and emesis the evening of 03/22/2022, noted to have increased O2 requirements and required 5 L nasal cannula from being on room air the day prior. -Chest x-ray done with new bilateral patchy airspace opacities possibly due to multifocal infection or pulmonary edema, small bilateral pleural effusions. -BNP noted to be elevated at 3564.6 -Continue IV albumin to every 12 hours.  -Patient was placed on IV Lasix and currently on 60 mg IV every 12 hours patient with good urine output of 1.950 L over the past 24 hours.   -Current weight of 105.82 pounds.  -O2 sats improving currently 97% on RA.  -Respiratory viral panel positive for influenza A. -Started Tamiflu. -Strict I's and O's. -Daily weights. -Status post 10 days IV  Unasyn. -Continue Mucinex due to coarse breath sounds.     3.  History of chronic alcoholic pancreatitis -Continue Creon.    4.  Hepatic cirrhosis with ascites/transaminitis -Being followed in the outpatient setting by [facility]. -BP soft and as such diuretics held spironolactone and Lasix held early on during the hospitalization. -.CT Abdomen and Pelvis done and showed "Marked diffuse subcutaneous body wall edema. Mild ascites. The constellation of findings can be seen the setting of anasarca. Punctate free intraperitoneal gas within the upper abdomen adjacent to numerous surgical clips involving the distal stomach. A site of bowel perforation is not clearly identified and there is no leakage of oral contrast. Correlation for an exogenous source of gas, as can be seen with paracentesis, is recommended. Mild hepatic steatosis. Changes in keeping with chronic pancreatitis."  -Patient with no hepatic encephalopathy. -Patient currently on IV Lasix 60 mg every 12 hours due to concerns for volume overload with good diuresis. -As volume status improves will likely transition to oral Lasix and could likely at that time place also on spironolactone hopefully in the next 1 to 2 days.. -Outpatient follow-up with GI.  5.  Gastric chronic ulcer with contained perforation -Stable. 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   -Hemoglobin seems to be stabilizing.    -  Continue PPI twice daily, Pepcid twice daily.  -GI consulted, assessed patient, CT chest abdomen and pelvis obtained per GI which showed multi focal groundglass pulmonary infiltrates new from 03/15/2022 more prevalent  within the lung apices bilaterally possibly related to atypical infection in the acute setting, large right and small left pleural effusions, marked diffuse subcutaneous body wall edema mild ascites can be seen with anasarca.  Punctuate free intraperitoneal gas within the upper abdomen adjacent to numerous surgical clips involving the distal stomach.  A site of bowel perforation is not clearly identified and no leakage of contrast noted.  Correlate with an exogenous source of gas as can be seen with paracentesis recommended.  Hepatic steatosis.  Changes of chronic pancreatitis.  No definite superimposed acute peripancreatic inflammatory changes noted. -Patient assessed by GI who reviewed CT scans, discussed patient's prior surgeries and feel at this time patient does not need any further endoscopic evaluation and to follow for now. -Follow H&H, transfusion threshold hemoglobin  < 8.    6.  Hypothyroidism -TSH noted at 33.958 on 03/15/2022. -Continue Synthroid 50 mcg's daily.   -Will need repeat labs in 2-4 weeks in the outpatient setting.     6.  Hypophosphatemia/hypomagnesemia/hypokalemia -Magnesium at 2.4.  -Phosphorus at 5.9, potassium at 3.6 -Repeat labs in the AM.     7.  Severe protein calorie malnutrition -Patient seen by dietitian, PICC line placed and patient started on TPN per pharmacy. -Albumin level noted at< 1.5 -IV albumin decreased to every 12 hours. -TPN has been weaned off.   -Continue oral selenium, multivitamin, vitamin B6 100 mg daily, vitamin EE 400 units daily, vitamin A 10,000 units every other day.  -Dietitian following.   8.  Anemia of chronic disease/folate deficiency -Hemoglobin noted to go as low as 6.5, status post transfusion 1 unit packed red blood cells.   Hemoglobin noted at 7.1 on 03/20/2022.  Status posttransfusion 2 units packed red blood cells hemoglobin currently at 10.0. 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   -Hemoglobin seemed to be trickling back down.   -GI consulted for further evaluation and management to see if repeat EGD needed as well.   -CT chest abdomen and pelvis done on 03/24/2022 per GI recommendations with multi focal groundglass pulmonary infiltrates new from 03/15/2022 more prevalent within the lung apices bilaterally possibly related to atypical infection in the acute setting, large right and small left pleural effusions, marked diffuse subcutaneous body wall edema mild ascites can be seen with anasarca.  Punctuate free intraperitoneal gas within the upper abdomen adjacent to numerous surgical clips involving the distal stomach.  A site of bowel perforation is not clearly identified and no leakage of contrast noted.  Correlate with an exogenous source of gas as can be seen with paracentesis recommended.  Hepatic steatosis.  Changes of chronic pancreatitis.  No definite superimposed acute peripancreatic inflammatory changes noted. -Patient assessed by GI who reviewed CT scans, discussed patient's prior surgeries and feel at this time patient does not need any further endoscopic evaluation and to follow for now. -Hemoglobin noted to have stabilized at 10.0. -Follow H&H, transfusion threshold hemoglobin  < 8.    9.  Right upper extremity swelling and pain -Improved with upper extremity elevation and on diuretics.   -Upper extremity Dopplers negative for DVT.     10.  NSVT -Patient noted to have 7 beat run of NSVT on 03/19/2022, patient was asymptomatic.   -Electrolytes repleted potassium at 4.0, magnesium at 2.1. -Hemoglobin noted at 7.1 on 03/20/2022, status post transfusion 3 units packed red blood cells during this hospitalization hemoglobin stable at 10.0.  -Repeat labs in the AM.   11.  Influenza A -Noted on respiratory viral panel. -Tamiflu.  12  hyperglycemia in the setting of steroids increase Semglee to 12 units daily.   Pressure Injury 01/07/22 Coccyx Mid Stage 1 -  Intact skin with non-blanchable redness of a localized area usually over a bony prominence. (Active)  01/07/22 1848  Location: Coccyx  Location Orientation: Mid  Staging: Stage 1 -  Intact skin with non-blanchable redness of a localized area usually over a bony prominence.  Wound Description (Comments):   Present on Admission: Yes  Dressing Type Foam - Lift dressing to assess site every shift 04/08/22 0800     Pressure Injury 03/15/22 Knee Anterior;Right Unstageable - Full thickness tissue loss in which the base of the injury is covered by slough (yellow, tan, gray, green or brown) and/or eschar (tan, brown or black) in the wound bed. blister on knee now open  (Active)  03/15/22   Location: Knee  Location Orientation: Anterior;Right  Staging: Unstageable - Full thickness tissue loss in which the base of the injury is covered by slough (yellow, tan, gray, green or brown) and/or eschar (tan, brown or black) in the wound bed.  Wound Description (Comments): blister on knee now open with black center  Present on Admission: Yes  Dressing Type Foam - Lift dressing to assess site every shift 04/05/22 0600      Nutrition Problem: Severe Malnutrition Etiology: chronic illness (chronic pancreatitis, h/o multiple gastric surgeries including gastric bypass)     Signs/Symptoms: severe fat depletion, severe muscle depletion    Interventions: Ensure Enlive (each supplement provides 350kcal and 20 grams of protein), MVI, Tube feeding, Prostat  Estimated body mass index is 16.53 kg/m as calculated from the following:   Height as of this encounter: 5\' 8"  (1.727 m).   Weight as of this encounter: 49.3 kg. DVT prophylaxis: Heparin Code Status: Full Family Communication: Updated patient.  No family at bedside.  Disposition: SNF.   Status is: Inpatient Remains inpatient  appropriate because: Severity of illness.  Unsafe disposition.   Consultants:  PCCM admission. Infectious disease: Dr. Candiss Norse 03/15/2022 Wound care RN 03/14/2022 Palliative care: Dr. Hilma Favors 03/23/2022 Gastroenterology: Dr. Loletha Carrow III 03/23/2022   Procedures:  Plain films of bilateral feet 03/13/2022 Chest x-ray 03/15/2022, 03/26/2022, 03/28/2022, 03/30/2022, 03/31/2022 MRI left foot and right foot 03/13/2022 PICC line placement 03/17/2022 Transfused 2 units packed red blood cells 03/20/2022 Transfuse 1 unit packed red blood cells 03/15/2022 Right upper extremity Dopplers 03/19/2022 CT chest abdomen and pelvis 03/24/2022 Acute abdominal series 03/23/2022    Subjective: She is resting in bed Diarrhea resolved Remains on room air able to maintain saturation Discussed with Dr. Hilma Favors Not a candidate for C IR they recommended SNF Objective: Vitals:   04/08/22 0800 04/08/22 0900 04/08/22 1000 04/08/22 1114  BP: (!) 143/87 132/84 111/75 119/76  Pulse: 72 98 79 82  Resp: 10 12 12  (!) 9  Temp:    98 F (36.7 C)  TempSrc:    Oral  SpO2: 98% 97% 96% 96%  Weight:      Height:        Intake/Output Summary (Last 24 hours) at 04/08/2022 1233 Last data filed at 04/08/2022  1036 Gross per 24 hour  Intake 300 ml  Output 950 ml  Net -650 ml    Filed Weights   04/04/22 0353 04/05/22 0500 04/07/22 0500  Weight: 48.1 kg 48 kg 49.3 kg    Examination:frail ill appearing   General exam: Appears in nad Respiratory system: Coarse  to auscultation. Respiratory effort normal. Cardiovascular system: S1 & S2 heard, RRR. No JVD, murmurs, rubs, gallops or clicks. No pedal edema. Gastrointestinal system: Abdomen is nondistended, soft and nontender. No organomegaly or masses felt. Normal bowel sounds heard. Central nervous system: Alert and oriented. No focal neurological deficits. Extremities: edema. Skin: Decreased erythema right upper extremity, severe desquamative dermatitis changes right upper extremity  bilateral feet.  Psychiatry: Judgement and insight appear normal. Mood & affect appropriate.     Data Reviewed: I have personally reviewed following labs and imaging studies  CBC: Recent Labs  Lab 04/02/22 0429 04/03/22 0229 04/05/22 0357 04/06/22 0338 04/07/22 0242  WBC 16.2* 16.8* 10.9* 11.4* 9.7  NEUTROABS  --   --  8.9*  --  8.0*  HGB 9.4* 9.2* 9.8* 10.0* 10.2*  HCT 29.9* 29.4* 30.6* 31.3* 31.6*  MCV 99.0 100.0 95.9 94.6 94.0  PLT 271 286 224 234 258    Basic Metabolic Panel: Recent Labs  Lab 04/03/22 0229 04/04/22 0500 04/05/22 0357 04/06/22 0338 04/07/22 0242  NA 134* 138 135 134* 133*  K 3.8 3.9 3.5 4.0 3.6  CL 94* 98 94* 94* 93*  CO2 26 28 25 27 26   GLUCOSE 152* 129* 112* 70 179*  BUN 62* 75* 75* 71* 84*  CREATININE 0.73 0.74 0.75 0.72 0.72  CALCIUM 9.2 9.5 9.8 9.7 10.1  MG 2.2 2.4 2.4 2.1 2.4  PHOS 4.6 6.1* 5.8* 4.6 5.9*    GFR: Estimated Creatinine Clearance: 55.3 mL/min (by C-G formula based on SCr of 0.72 mg/dL). Liver Function Tests: Recent Labs  Lab 04/02/22 0429 04/03/22 0229 04/05/22 0357 04/06/22 0338 04/07/22 0242  AST  --  33 42*  --  32  ALT  --  28 32  --  31  ALKPHOS  --  120 107  --  98  BILITOT  --  0.5 0.8  --  0.7  PROT  --  6.7 7.9  --  8.2*  ALBUMIN 4.3 4.6 4.8 4.6 5.4*    No results for input(s): "LIPASE", "AMYLASE" in the last 168 hours. No results for input(s): "AMMONIA" in the last 168 hours. Coagulation Profile: No results for input(s): "INR", "PROTIME" in the last 168 hours. Cardiac Enzymes: No results for input(s): "CKTOTAL", "CKMB", "CKMBINDEX", "TROPONINI" in the last 168 hours. BNP (last 3 results) No results for input(s): "PROBNP" in the last 8760 hours. HbA1C: Recent Labs    04/07/22 0242  HGBA1C 5.3    CBG: Recent Labs  Lab 04/07/22 1117 04/07/22 1644 04/07/22 2237 04/08/22 0746 04/08/22 1112  GLUCAP 382* 311* 254* 362* 99    Lipid Profile: No results for input(s): "CHOL", "HDL",  "LDLCALC", "TRIG", "CHOLHDL", "LDLDIRECT" in the last 72 hours. Thyroid Function Tests: No results for input(s): "TSH", "T4TOTAL", "FREET4", "T3FREE", "THYROIDAB" in the last 72 hours. Anemia Panel: No results for input(s): "VITAMINB12", "FOLATE", "FERRITIN", "TIBC", "IRON", "RETICCTPCT" in the last 72 hours. Sepsis Labs: No results for input(s): "PROCALCITON", "LATICACIDVEN" in the last 168 hours.  Recent Results (from the past 240 hour(s))  Respiratory (~20 pathogens) panel by PCR     Status: Abnormal   Collection Time: 04/05/22 11:26 AM   Specimen:  Nasopharyngeal Swab; Respiratory  Result Value Ref Range Status   Adenovirus NOT DETECTED NOT DETECTED Final   Coronavirus 229E NOT DETECTED NOT DETECTED Final    Comment: (NOTE) The Coronavirus on the Respiratory Panel, DOES NOT test for the novel  Coronavirus (2019 nCoV)    Coronavirus HKU1 NOT DETECTED NOT DETECTED Final   Coronavirus NL63 NOT DETECTED NOT DETECTED Final   Coronavirus OC43 NOT DETECTED NOT DETECTED Final   Metapneumovirus NOT DETECTED NOT DETECTED Final   Rhinovirus / Enterovirus NOT DETECTED NOT DETECTED Final   Influenza A H1 2009 DETECTED (A) NOT DETECTED Final   Influenza B NOT DETECTED NOT DETECTED Final   Parainfluenza Virus 1 NOT DETECTED NOT DETECTED Final   Parainfluenza Virus 2 NOT DETECTED NOT DETECTED Final   Parainfluenza Virus 3 NOT DETECTED NOT DETECTED Final   Parainfluenza Virus 4 NOT DETECTED NOT DETECTED Final   Respiratory Syncytial Virus NOT DETECTED NOT DETECTED Final   Bordetella pertussis NOT DETECTED NOT DETECTED Final   Bordetella Parapertussis NOT DETECTED NOT DETECTED Final   Chlamydophila pneumoniae NOT DETECTED NOT DETECTED Final   Mycoplasma pneumoniae NOT DETECTED NOT DETECTED Final    Comment: Performed at [facility], [address], [place], [place] [address]  Group A Strep by PCR     Status: None   Collection Time: 04/05/22 11:27 AM   Specimen: Throat; Sterile Swab   Result Value Ref Range Status   Group A Strep by PCR NOT DETECTED NOT DETECTED Final    Comment: Performed at [facility], [address] [place], [place] [address]         Radiology Studies: CT CELIAC PLEXUS BLOCK ANESTHETIC  Result Date: 04/06/2022 INDICATION: Chronic pancreatitis.  Intractable abdominal pain. EXAM: CT-GUIDED CELIAC PLEXUS NERVE BLOCK, DIAGNOSTIC AND THERAPEUTIC COMPARISON:  None Available. MEDICATIONS: 80 mg Kenalog and 30 mL 0.5% bupivacaine. ANESTHESIA/SEDATION: Moderate (conscious) sedation was employed during this procedure. A total of Versed 2 mg and Fentanyl 100 mcg was administered intravenously. Moderate Sedation Time: 37 minutes. The patient's level of consciousness and vital signs were monitored continuously by radiology nursing throughout the procedure under my direct supervision. CONTRAST:  1 mL Omnipaque 300 FLUOROSCOPY TIME:  CT dose in mGy was not provided. COMPLICATIONS: None immediate. PROCEDURE: RADIATION DOSE REDUCTION: This exam was performed according to the departmental dose-optimization program which includes automated exposure control, adjustment of the mA and/or kV according to patient size and/or use of iterative reconstruction technique. Informed consent was obtained from the patient following an explanation of the procedure, risks, benefits and alternatives. A time out was performed prior to the initiation of the procedure. The patient was positioned prone on the CT table and a limited CT was performed for procedural planning demonstrating an adequate window at the upper abdomen. The procedure was planned. A timeout was performed prior to the initiation of the procedure. The operative site was prepped and draped in the usual sterile fashion. Appropriate trajectory was confirmed with a 22 gauge spinal needle after the adjacent tissues were anesthetized with 1% Lidocaine with epinephrine. Under intermittent CT guidance, 15 cm 21-gauge  Chiba needles were inserted via a posterior approach and the needle tips were positioned in the retroperitoneal space immediately anterolateral to the aorta, at the region of the celiac trunk. A small amount of dilute contrast was injected through the needles to confirm position. A solution containing 2 mL of 40 mg/mL Kenalog and 15 mL of Bupivacaine was mixed and injected  through each needle. Intermittent CT scanning confirmed appropriate spread into the extra vascular spaces. The needles were removed and hemostasis was achieved with manual compression. A limited postprocedural CT was negative for hemorrhage or additional complication. A dressing was placed. The patient tolerated the procedure well without immediate postprocedural complication. IMPRESSION: Successful CT-guided diagnostic and therapeutic celiac plexus nerve block, as above. PLAN: The patient will be re-evaluated in the postprocedural setting and again in 3-6 months to assess the efficacy of this nerve block. If successful, the patient may be a candidate for future celiac plexus nerve blocks versus neurolysis. Michaelle Birks, MD [facility] Radiology Electronically Signed   By: Michaelle Birks M.D.   On: 04/06/2022 17:44        Scheduled Meds:  (feeding supplement) PROSource Plus  30 mL Oral BID BM   acetaminophen  1,000 mg Oral TID   bupivacaine(PF)  30 mL Infiltration Once   Chlorhexidine Gluconate Cloth  6 each Topical Daily   cholecalciferol  1,000 Units Oral Daily   dexamethasone (DECADRON) injection  4 mg Intravenous Q24H   dextromethorphan-guaiFENesin  1 tablet Oral BID   enoxaparin (LOVENOX) injection  40 mg Subcutaneous Q24H   famotidine  10 mg Oral BID   feeding supplement  237 mL Oral TID BM   fentaNYL  1 patch Transdermal Q72H   fluconazole  200 mg Oral Daily   furosemide  60 mg Intravenous Q12H   gabapentin  300 mg Oral TID   insulin aspart  0-15 Units Subcutaneous TID WC    insulin aspart  3 Units Subcutaneous TID WC   [START ON 04/09/2022] insulin glargine-yfgn  12 Units Subcutaneous Daily   levothyroxine  50 mcg Oral Q0600   liothyronine  25 mcg Oral Daily   lipase/protease/amylase  24,000 Units Oral TID WC   metoprolol tartrate  25 mg Oral BID   multivitamin with minerals  1 tablet Oral Daily   mupirocin cream   Topical Once   oseltamivir  30 mg Oral BID   pantoprazole  40 mg Oral BID   phenobarbital  16.2 mg Oral QHS   pyridOXINE  100 mg Oral Daily   saccharomyces boulardii  250 mg Oral BID   selenium  100 mcg Oral Daily   silver sulfADIAZINE   Topical Daily   sodium chloride flush  10-40 mL Intracatheter Q12H   vitamin A  10,000 Units Oral Q48H   vitamin B-12  100 mcg Oral Daily   vitamin E  400 Units Oral Daily   Continuous Infusions:  albumin human Stopped (04/08/22 1036)     LOS: 25 days    Time spent:3 7 min  Georgette Shell, MD  04/08/2022, 12:33 PM

## 2022-04-08 NOTE — Inpatient Diabetes Management (Signed)
Inpatient Diabetes Program Recommendations  AACE/ADA: New Consensus Statement on Inpatient Glycemic Control (2015)  Target Ranges:  Prepandial:   less than 140 mg/dL      Peak postprandial:   less than 180 mg/dL (1-2 hours)      Critically ill patients:  140 - 180 mg/dL   Lab Results  Component Value Date   GLUCAP 362 (H) 04/08/2022   HGBA1C 5.3 04/07/2022    Review of Glycemic Control  Latest Reference Range & Units 04/07/22 11:17 04/07/22 16:44 04/07/22 22:37 04/08/22 07:46  Glucose-Capillary 70 - 99 mg/dL 382 (H) 311 (H) 254 (H) 362 (H)  (H): Data is abnormally high Diabetes history: none noted Current orders for Inpatient glycemic control: Novolog 0-15 units TID, Semglee 6 units QD, Novolog 3 units TID Decadron 4 mg QD Inpatient Diabetes Program Recommendations:     In the setting of steroids, consider: - Increasing Semglee to 12 units QD -Increasing Novolog 6 units TID (assuming patient is consuming >50% of meals) -carb modified diet if appropriate  Noted RDs note from 1/24 regarding Ensure. Do feel that steroids are playing a role in hyperglycemia. However, consumption of supplement with more consistent regular diet intake is contributing and that TPN had insulin within the bag.  Will adjust insulin and follow.   Thanks, Bronson Curb, MSN, RNC-OB Diabetes Coordinator [phone] (8a-5p)

## 2022-04-09 DIAGNOSIS — R6520 Severe sepsis without septic shock: Secondary | ICD-10-CM | POA: Diagnosis not present

## 2022-04-09 DIAGNOSIS — A419 Sepsis, unspecified organism: Secondary | ICD-10-CM | POA: Diagnosis not present

## 2022-04-09 LAB — GLUCOSE, CAPILLARY
Glucose-Capillary: 146 mg/dL — ABNORMAL HIGH (ref 70–99)
Glucose-Capillary: 208 mg/dL — ABNORMAL HIGH (ref 70–99)
Glucose-Capillary: 271 mg/dL — ABNORMAL HIGH (ref 70–99)
Glucose-Capillary: 282 mg/dL — ABNORMAL HIGH (ref 70–99)

## 2022-04-09 NOTE — Progress Notes (Signed)
PROGRESS NOTE    Carolyn [name]  PYK:[id number] DOB: 27-Mar-1955 DOA: 03/13/2022 PCP: Pcp, No   Brief Narrative: 67 year old female, past medical history of chronic pancreatitis, history of prior alcoholism, gastroesophageal reflux, prior vitamin D deficiency documented. She has known malabsorption issues and has been on Creon. She presents with cellulitis and sepsis related to a ongoing rash of the hands and feet. It appears that the rash has been going on for several weeks continued to get worse. As started off with a small blister the blister then ruptured the skin started to dry out crack and flake. She is starting to flake and desquamate portions of the hands and feet. She developed increased pain and swelling in the right hand that developed redness that tracks into the forearm. She presents to the ER with an elevated white count and was treated with antibiotics for concern of sepsis.  Patient admitted to critical care team subsequently transferred to [facility] service on 01/14/2021 after stabilization noted to be in significant pain, palliative care GI consulted. -Patient noted to have been transfused 2 units packed red blood cells given trending hemoglobin. -Patient seen by GI for further evaluation and also by palliative care who have adjusted pain medications patient started on IV Decadron with improvement with pain management. -Patient also noted to go into volume overload, placed on IV Lasix with good diuresis with adjustment in diuretics. -Patient will also with on calorie count which have improved TPN can be weaned down.  Assessment & Plan:   Principal Problem:   Severe sepsis ([place]) Active Problems:   Severe protein-calorie malnutrition (HCC)   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HCC)   Acute respiratory failure with hypoxia (HCC)   Chronic gastric ulcer with perforation (HCC)   Desquamative dermatitis   Sepsis ([place])   Cellulitis of left lower extremity   Hypophosphatemia   Anemia of  chronic disease   Localized swelling of right upper extremity   NSVT (nonsustained ventricular tachycardia) (HCC)   History of gastric ulcer   Abnormal loss of weight   Aspiration pneumonia of both lungs (HCC)   Acute on chronic diastolic CHF (congestive heart failure) (HCC)   Influenza A   #1 severe sepsis secondary to disqamative dermatitis with superimposed cellulitis and possible underlying osteomyelitis -Patient noted to have presented with complicated nonhealing desquamative dermatitis involving hands and feet complicated by severe sepsis due to cellulitis/wound infection. -MRI of bilateral hands and feet done without any definite evidence of osteomyelitis. -Patient initially admitted to the critical care service, concern for probable vitamin/nutritional deficiencies versus paraneoplastic syndromes. -Concern for possible nutritional deficiencies, B2, B6, tryptophan, zinc, thiamine etc. could lead to this. -Patient noted to be prior alcoholic now with a chronic pancreatitis on Creon and likely has pre-existing condition of malabsorption as well as possible gastric surgery unsure as to what the patient has had this on not however per PCCM. -Patient placed empirically on IV vancomycin, IV Rocephin and subsequently transition to doxycycline and Augmentin and just completed a 10-day course of antibiotic treatment 03/22/2022.  -Patient noted to go into acute hypoxic respiratory failure with concerns for possible aspiration pneumonia and volume overload and placed empirically on IV Unasyn to treat for total of 10 days. -Continue vitamin D3, vitamin B12, zinc, vitamin D3. -Continue vitamin A, selenium. -Will need follow-up with dermatology (we have followed in [name]) -Will need outpatient follow-up with podiatry for bone biopsy to evaluate for osteomyelitis. -Outpatient follow-up with ID was set up for 03/22/2022 however will need  to be rescheduled as patient is still  hospitalized. -Patient unable to ambulate seen by PT would likely require SNF placement. -Patient with ongoing pain, causing tachycardia, continue current regimen and IV Dilaudid as needed. -Continue current pain regimen as recommended by palliative care of Dilaudid 2 mg every 2 hours, fentanyl patch 25 mcg increased to 50 mcg and subsequently increased to 100 mcg/h every 72 hours on 03/31/2022.  Patient also noted on T3 and phenobarbital for anxiety at night.   -Due to drowsiness Fentanyl patch decreased to 50 mcg daily on 04/04/2022 per palliative care. -Patient also placed on Decadron 8 mg IV daily and wean down to 4 mg daily and recommended a long taper over several weeks per palliative care as well as aggressive diuresis and antibiotics with repeat of T3, T4 and cortisol level. -Patient being referred for celiac block by palliative care per IR 04/06/2022. -Palliative care managing pain regimen.  -Continue supportive care. -Palliative care following and appreciate input and recommendations.  AWAIT SNF  2.  Acute hypoxic respiratory failure likely secondary to acute on chronic diastolic CHF exacerbation +/- aspiration pneumonia/influenza A -Patient noted to have a bout of nausea and emesis the evening of 03/22/2022, noted to have increased O2 requirements and required 5 L nasal cannula from being on room air the day prior. -Chest x-ray done with new bilateral patchy airspace opacities possibly due to multifocal infection or pulmonary edema, small bilateral pleural effusions. -BNP noted to be elevated at 3564.6 -Continue IV albumin to every 12 hours.  -Patient was placed on IV Lasix and currently on 60 mg IV every 12 hours patient with good urine output of 1.950 L over the past 24 hours.   -Current weight of 105.82 pounds.  -O2 sats improving currently 97% on RA.  -Respiratory viral panel positive for influenza A. -Started Tamiflu. -Strict I's and O's. -Daily weights. -Status post 10 days IV  Unasyn. -Continue Mucinex due to coarse breath sounds.     3.  History of chronic alcoholic pancreatitis -Continue Creon.    4.  Hepatic cirrhosis with ascites/transaminitis -Being followed in the outpatient setting by [facility]. -BP soft and as such diuretics held spironolactone and Lasix held early on during the hospitalization. -.CT Abdomen and Pelvis done and showed "Marked diffuse subcutaneous body wall edema. Mild ascites. The constellation of findings can be seen the setting of anasarca. Punctate free intraperitoneal gas within the upper abdomen adjacent to numerous surgical clips involving the distal stomach. A site of bowel perforation is not clearly identified and there is no leakage of oral contrast. Correlation for an exogenous source of gas, as can be seen with paracentesis, is recommended. Mild hepatic steatosis. Changes in keeping with chronic pancreatitis."  -Patient with no hepatic encephalopathy. -Patient currently on IV Lasix 60 mg every 12 hours due to concerns for volume overload with good diuresis. -As volume status improves will likely transition to oral Lasix and could likely at that time place also on spironolactone hopefully in the next 1 to 2 days.. -Outpatient follow-up with GI.  5.  Gastric chronic ulcer with contained perforation -Stable. 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   -Hemoglobin seems to be stabilizing.    -  Continue PPI twice daily, Pepcid twice daily.  -GI consulted, assessed patient, CT chest abdomen and pelvis obtained per GI which showed multi focal groundglass pulmonary infiltrates new from 03/15/2022 more prevalent  within the lung apices bilaterally possibly related to atypical infection in the acute setting, large right and small left pleural effusions, marked diffuse subcutaneous body wall edema mild ascites can be seen with anasarca.  Punctuate free intraperitoneal gas within the upper abdomen adjacent to numerous surgical clips involving the distal stomach.  A site of bowel perforation is not clearly identified and no leakage of contrast noted.  Correlate with an exogenous source of gas as can be seen with paracentesis recommended.  Hepatic steatosis.  Changes of chronic pancreatitis.  No definite superimposed acute peripancreatic inflammatory changes noted. -Patient assessed by GI who reviewed CT scans, discussed patient's prior surgeries and feel at this time patient does not need any further endoscopic evaluation and to follow for now. -Follow H&H, transfusion threshold hemoglobin  < 8.    6.  Hypothyroidism -TSH noted at 33.958 on 03/15/2022. -Continue Synthroid 50 mcg's daily.   -Will need repeat labs in 2-4 weeks in the outpatient setting.     6.  Hypophosphatemia/hypomagnesemia/hypokalemia -Magnesium at 2.4.  -Phosphorus at 5.9, potassium at 3.6 -Repeat labs in the AM.     7.  Severe protein calorie malnutrition -Patient seen by dietitian, PICC line placed and patient started on TPN per pharmacy. -Albumin level noted at< 1.5 -IV albumin decreased to every 12 hours. -TPN has been weaned off.   -Continue oral selenium, multivitamin, vitamin B6 100 mg daily, vitamin EE 400 units daily, vitamin A 10,000 units every other day.  -Dietitian following.   8.  Anemia of chronic disease/folate deficiency -Hemoglobin noted to go as low as 6.5, status post transfusion 1 unit packed red blood cells.   Hemoglobin noted at 7.1 on 03/20/2022.  Status posttransfusion 2 units packed red blood cells hemoglobin currently at 10.0. 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   -Hemoglobin seemed to be trickling back down.   -GI consulted for further evaluation and management to see if repeat EGD needed as well.   -CT chest abdomen and pelvis done on 03/24/2022 per GI recommendations with multi focal groundglass pulmonary infiltrates new from 03/15/2022 more prevalent within the lung apices bilaterally possibly related to atypical infection in the acute setting, large right and small left pleural effusions, marked diffuse subcutaneous body wall edema mild ascites can be seen with anasarca.  Punctuate free intraperitoneal gas within the upper abdomen adjacent to numerous surgical clips involving the distal stomach.  A site of bowel perforation is not clearly identified and no leakage of contrast noted.  Correlate with an exogenous source of gas as can be seen with paracentesis recommended.  Hepatic steatosis.  Changes of chronic pancreatitis.  No definite superimposed acute peripancreatic inflammatory changes noted. -Patient assessed by GI who reviewed CT scans, discussed patient's prior surgeries and feel at this time patient does not need any further endoscopic evaluation and to follow for now. -Hemoglobin noted to have stabilized at 10.0. -Follow H&H, transfusion threshold hemoglobin  < 8.    9.  Right upper extremity swelling and pain -Improved with upper extremity elevation and on diuretics.   -Upper extremity Dopplers negative for DVT.     10.  NSVT -Patient noted to have 7 beat run of NSVT on 03/19/2022, patient was asymptomatic.   -Electrolytes repleted potassium at 4.0, magnesium at 2.1. -Hemoglobin noted at 7.1 on 03/20/2022, status post transfusion 3 units packed red blood cells during this hospitalization hemoglobin stable at 10.0.  -Repeat labs in the AM.   11.  Influenza A -Noted on respiratory viral panel. -Tamiflu.  12  hyperglycemia in the setting of steroids increase Semglee to 12 units daily.   Pressure Injury 01/07/22 Coccyx Mid Stage 1 -  Intact skin with non-blanchable redness of a localized area usually over a bony prominence. (Active)  01/07/22 1848  Location: Coccyx  Location Orientation: Mid  Staging: Stage 1 -  Intact skin with non-blanchable redness of a localized area usually over a bony prominence.  Wound Description (Comments):   Present on Admission: Yes  Dressing Type Foam - Lift dressing to assess site every shift 04/08/22 2309     Pressure Injury 03/15/22 Knee Anterior;Right Unstageable - Full thickness tissue loss in which the base of the injury is covered by slough (yellow, tan, gray, green or brown) and/or eschar (tan, brown or black) in the wound bed. blister on knee now open  (Active)  03/15/22   Location: Knee  Location Orientation: Anterior;Right  Staging: Unstageable - Full thickness tissue loss in which the base of the injury is covered by slough (yellow, tan, gray, green or brown) and/or eschar (tan, brown or black) in the wound bed.  Wound Description (Comments): blister on knee now open with black center  Present on Admission: Yes  Dressing Type Gauze (Comment) 04/08/22 2309      Nutrition Problem: Severe Malnutrition Etiology: chronic illness (chronic pancreatitis, h/o multiple gastric surgeries including gastric bypass)     Signs/Symptoms: severe fat depletion, severe muscle depletion    Interventions: Ensure Enlive (each supplement provides 350kcal and 20 grams of protein), MVI, Tube feeding, Prostat  Estimated body mass index is 16.53 kg/m as calculated from the following:   Height as of this encounter: 5\' 8"  (1.727 m).   Weight as of this encounter: 49.3 kg. DVT prophylaxis: Heparin Code Status: Full Family Communication: Updated patient.  No family at bedside.  Disposition: await snf  Status is: Inpatient Remains inpatient appropriate because: Severity  of illness.  Unsafe disposition.   Consultants:  PCCM admission. Infectious disease: Dr. 03/15/2022 Wound care RN 03/14/2022 Palliative care: Dr. 03/16/2022 03/23/2022 Gastroenterology: Dr. 05/22/2022 III 03/23/2022   Procedures:  Plain films of bilateral feet 03/13/2022 Chest x-ray 03/15/2022, 03/26/2022, 03/28/2022, 03/30/2022, 03/31/2022 MRI left foot and right foot 03/13/2022 PICC line placement 03/17/2022 Transfused 2 units packed red blood cells 03/20/2022 Transfuse 1 unit packed red blood cells 03/15/2022 Right upper extremity Dopplers 03/19/2022 CT chest abdomen and pelvis 03/24/2022 Acute abdominal series 03/23/2022    Subjective:  No events overnight Await snf Objective: Vitals:   04/08/22 1700 04/08/22 1800 04/08/22 1900 04/08/22 2100  BP: (!) 133/94 (!) 133/100 113/82   Pulse: 75 (!) 45 94   Resp: 10 14 12    Temp:    98.2 F (36.8 C)  TempSrc:      SpO2: 96% 96% 100%   Weight:      Height:        Intake/Output Summary (Last 24 hours) at 04/09/2022 1133 Last data filed at 04/09/2022 0747 Gross per 24 hour  Intake 780 ml  Output 2600 ml  Net -1820 ml    Filed Weights   04/04/22 0353 04/05/22 0500  04/07/22 0500  Weight: 48.1 kg 48 kg 49.3 kg    Examination:frail ill appearing   General exam: Appears in nad Respiratory system: Coarse  to auscultation. Respiratory effort normal. Cardiovascular system: S1 & S2 heard, RRR. No JVD, murmurs, rubs, gallops or clicks. No pedal edema. Gastrointestinal system: Abdomen is nondistended, soft and nontender. No organomegaly or masses felt. Normal bowel sounds heard. Central nervous system: Alert and oriented. No focal neurological deficits. Extremities: edema. Skin: Decreased erythema right upper extremity, severe desquamative dermatitis changes right upper extremity bilateral feet.  Psychiatry: Judgement and insight appear normal. Mood & affect appropriate.     Data Reviewed: I have personally reviewed following labs and imaging  studies  CBC: Recent Labs  Lab 04/03/22 0229 04/05/22 0357 04/06/22 0338 04/07/22 0242  WBC 16.8* 10.9* 11.4* 9.7  NEUTROABS  --  8.9*  --  8.0*  HGB 9.2* 9.8* 10.0* 10.2*  HCT 29.4* 30.6* 31.3* 31.6*  MCV 100.0 95.9 94.6 94.0  PLT 286 224 234 258    Basic Metabolic Panel: Recent Labs  Lab 04/03/22 0229 04/04/22 0500 04/05/22 0357 04/06/22 0338 04/07/22 0242  NA 134* 138 135 134* 133*  K 3.8 3.9 3.5 4.0 3.6  CL 94* 98 94* 94* 93*  CO2 26 28 25 27 26   GLUCOSE 152* 129* 112* 70 179*  BUN 62* 75* 75* 71* 84*  CREATININE 0.73 0.74 0.75 0.72 0.72  CALCIUM 9.2 9.5 9.8 9.7 10.1  MG 2.2 2.4 2.4 2.1 2.4  PHOS 4.6 6.1* 5.8* 4.6 5.9*    GFR: Estimated Creatinine Clearance: 55.3 mL/min (by C-G formula based on SCr of 0.72 mg/dL). Liver Function Tests: Recent Labs  Lab 04/03/22 0229 04/05/22 0357 04/06/22 0338 04/07/22 0242  AST 33 42*  --  32  ALT 28 32  --  31  ALKPHOS 120 107  --  98  BILITOT 0.5 0.8  --  0.7  PROT 6.7 7.9  --  8.2*  ALBUMIN 4.6 4.8 4.6 5.4*    No results for input(s): "LIPASE", "AMYLASE" in the last 168 hours. No results for input(s): "AMMONIA" in the last 168 hours. Coagulation Profile: No results for input(s): "INR", "PROTIME" in the last 168 hours. Cardiac Enzymes: No results for input(s): "CKTOTAL", "CKMB", "CKMBINDEX", "TROPONINI" in the last 168 hours. BNP (last 3 results) No results for input(s): "PROBNP" in the last 8760 hours. HbA1C: Recent Labs    04/07/22 0242  HGBA1C 5.3    CBG: Recent Labs  Lab 04/08/22 0746 04/08/22 1112 04/08/22 1614 04/08/22 2144 04/09/22 0742  GLUCAP 362* 99 346* 261* 146*    Lipid Profile: No results for input(s): "CHOL", "HDL", "LDLCALC", "TRIG", "CHOLHDL", "LDLDIRECT" in the last 72 hours. Thyroid Function Tests: No results for input(s): "TSH", "T4TOTAL", "FREET4", "T3FREE", "THYROIDAB" in the last 72 hours. Anemia Panel: No results for input(s): "VITAMINB12", "FOLATE", "FERRITIN",  "TIBC", "IRON", "RETICCTPCT" in the last 72 hours. Sepsis Labs: No results for input(s): "PROCALCITON", "LATICACIDVEN" in the last 168 hours.  Recent Results (from the past 240 hour(s))  Respiratory (~20 pathogens) panel by PCR     Status: Abnormal   Collection Time: 04/05/22 11:26 AM   Specimen: Nasopharyngeal Swab; Respiratory  Result Value Ref Range Status   Adenovirus NOT DETECTED NOT DETECTED Final   Coronavirus 229E NOT DETECTED NOT DETECTED Final    Comment: (NOTE) The Coronavirus on the Respiratory Panel, DOES NOT test for the novel  Coronavirus (2019 nCoV)    Coronavirus HKU1 NOT DETECTED NOT DETECTED  Final   Coronavirus NL63 NOT DETECTED NOT DETECTED Final   Coronavirus OC43 NOT DETECTED NOT DETECTED Final   Metapneumovirus NOT DETECTED NOT DETECTED Final   Rhinovirus / Enterovirus NOT DETECTED NOT DETECTED Final   Influenza A H1 2009 DETECTED (A) NOT DETECTED Final   Influenza B NOT DETECTED NOT DETECTED Final   Parainfluenza Virus 1 NOT DETECTED NOT DETECTED Final   Parainfluenza Virus 2 NOT DETECTED NOT DETECTED Final   Parainfluenza Virus 3 NOT DETECTED NOT DETECTED Final   Parainfluenza Virus 4 NOT DETECTED NOT DETECTED Final   Respiratory Syncytial Virus NOT DETECTED NOT DETECTED Final   Bordetella pertussis NOT DETECTED NOT DETECTED Final   Bordetella Parapertussis NOT DETECTED NOT DETECTED Final   Chlamydophila pneumoniae NOT DETECTED NOT DETECTED Final   Mycoplasma pneumoniae NOT DETECTED NOT DETECTED Final    Comment: Performed at [facility], [place] [address], [place], [place] [address]  Group A Strep by PCR     Status: None   Collection Time: 04/05/22 11:27 AM   Specimen: Throat; Sterile Swab  Result Value Ref Range Status   Group A Strep by PCR NOT DETECTED NOT DETECTED Final    Comment: Performed at [facility], [place] [address], [place], [place] [address]         Radiology Studies: No results found.      Scheduled  Meds:  (feeding supplement) PROSource Plus  30 mL Oral BID BM   acetaminophen  1,000 mg Oral TID   bupivacaine(PF)  30 mL Infiltration Once   Chlorhexidine Gluconate Cloth  6 each Topical Daily   cholecalciferol  1,000 Units Oral Daily   dexamethasone (DECADRON) injection  4 mg Intravenous Q24H   dextromethorphan-guaiFENesin  1 tablet Oral BID   enoxaparin (LOVENOX) injection  40 mg Subcutaneous Q24H   famotidine  10 mg Oral BID   feeding supplement  237 mL Oral TID BM   fentaNYL  1 patch Transdermal Q72H   fluconazole  200 mg Oral Daily   furosemide  60 mg Intravenous Q12H   gabapentin  300 mg Oral TID   insulin aspart  0-15 Units Subcutaneous TID WC   insulin aspart  3 Units Subcutaneous TID WC   insulin glargine-yfgn  12 Units Subcutaneous Daily   levothyroxine  50 mcg Oral Q0600   liothyronine  25 mcg Oral Daily   lipase/protease/amylase  24,000 Units Oral TID WC   metoprolol tartrate  25 mg Oral BID   multivitamin with minerals  1 tablet Oral Daily   mupirocin cream   Topical Once   oseltamivir  30 mg Oral BID   pantoprazole  40 mg Oral BID   phenobarbital  16.2 mg Oral QHS   pyridOXINE  100 mg Oral Daily   saccharomyces boulardii  250 mg Oral BID   selenium  100 mcg Oral Daily   silver sulfADIAZINE   Topical Daily   sodium chloride flush  10-40 mL Intracatheter Q12H   vitamin A  10,000 Units Oral Q48H   vitamin B-12  100 mcg Oral Daily   vitamin E  400 Units Oral Daily   Continuous Infusions:  albumin human 12.5 g (04/09/22 0918)     LOS: 26 days    Time spent:3 7 min  Georgette Shell, MD  04/09/2022, 11:33 AM

## 2022-04-09 NOTE — TOC Progression Note (Addendum)
Transition of Care [facility]) - Progression Note    Patient Details  Name: Carolyn [name] MRN: [id number] Date of Birth: 05/30/1955  Transition of Care [facility]) CM/SW [place], LCSW Phone Number: 04/09/2022, 9:29 AM  Clinical Narrative:     CSW spoke with pt's husband to provided updated information. Pt's husband was informed pt only received two bed offers, [facility] and [facility]. Pt's husband stated he needed more time to make a decision. TOC to follow.   ADDEN 1:20pm CSW received a call from pt's husband who reported he went to [facility] and spoke with admission, who reported they will have a bed on Monday. CSW informed pt's husband the facility did declined. Pt's spouse is requested pt's information to be sent out again. CSW spoke to [place] admissions with [facility] who confirmed she can review pt's information again. TOC to follow.   4:15pm CSW spoke to [place] with [facility] , she stated they will have a bed for the pt on Monday, however there is a 43 dollar a day fee. CSW spoke with pt's husband who accepted bed offer and reported can pay the fee. TOC to start auth. TOC to follow.   Expected Discharge Plan: [place] Barriers to Discharge: Continued Medical Work up  Expected Discharge Plan and Services   Discharge Planning Services: CM Consult Post Acute Care Choice: [place] arrangements for the past 2 months: Single Family Home                                       Social Determinants of Health (SDOH) Interventions SDOH Screenings   Food Insecurity: No Food Insecurity (03/15/2022)  Housing: Low Risk  (03/15/2022)  Transportation Needs: No Transportation Needs (03/15/2022)  Utilities: At Risk (03/15/2022)  Depression (PHQ2-9): Low Risk  (07/22/2020)  Tobacco Use: Low Risk  (04/06/2022)    Readmission Risk Interventions     No data to display

## 2022-04-10 DIAGNOSIS — A419 Sepsis, unspecified organism: Secondary | ICD-10-CM | POA: Diagnosis not present

## 2022-04-10 DIAGNOSIS — R6520 Severe sepsis without septic shock: Secondary | ICD-10-CM | POA: Diagnosis not present

## 2022-04-10 LAB — COMPREHENSIVE METABOLIC PANEL
ALT: 46 U/L — ABNORMAL HIGH (ref 0–44)
AST: 47 U/L — ABNORMAL HIGH (ref 15–41)
Albumin: 5.3 g/dL — ABNORMAL HIGH (ref 3.5–5.0)
Alkaline Phosphatase: 88 U/L (ref 38–126)
Anion gap: 15 (ref 5–15)
BUN: 86 mg/dL — ABNORMAL HIGH (ref 8–23)
CO2: 28 mmol/L (ref 22–32)
Calcium: 10.2 mg/dL (ref 8.9–10.3)
Chloride: 88 mmol/L — ABNORMAL LOW (ref 98–111)
Creatinine, Ser: 0.72 mg/dL (ref 0.44–1.00)
GFR, Estimated: >60 mL/min (ref >60)
Glucose, Bld: 136 mg/dL — ABNORMAL HIGH (ref 70–99)
Potassium: 3.9 mmol/L (ref 3.5–5.1)
Sodium: 131 mmol/L — ABNORMAL LOW (ref 135–145)
Total Bilirubin: 0.7 mg/dL (ref 0.3–1.2)
Total Protein: 8.4 g/dL — ABNORMAL HIGH (ref 6.5–8.1)

## 2022-04-10 LAB — CBC
HCT: 36.1 % (ref 36.0–46.0)
Hemoglobin: 12 g/dL (ref 12.0–15.0)
MCH: 30.6 pg (ref 26.0–34.0)
MCHC: 33.2 g/dL (ref 30.0–36.0)
MCV: 92.1 fL (ref 80.0–100.0)
Platelets: 262 10*3/uL (ref 150–400)
RBC: 3.92 MIL/uL (ref 3.87–5.11)
RDW: 15.2 % (ref 11.5–15.5)
WBC: 13 10*3/uL — ABNORMAL HIGH (ref 4.0–10.5)
nRBC: 0 % (ref 0.0–0.2)

## 2022-04-10 LAB — GLUCOSE, CAPILLARY
Glucose-Capillary: 237 mg/dL — ABNORMAL HIGH (ref 70–99)
Glucose-Capillary: 166 mg/dL — ABNORMAL HIGH (ref 70–99)
Glucose-Capillary: 238 mg/dL — ABNORMAL HIGH (ref 70–99)
Glucose-Capillary: 461 mg/dL — ABNORMAL HIGH (ref 70–99)

## 2022-04-10 LAB — VITAMIN E
Vitamin E (Alpha Tocopherol): 7.6 mg/L — ABNORMAL LOW (ref 9.0–29.0)
Vitamin E(Gamma Tocopherol): 0.2 mg/L — ABNORMAL LOW (ref 0.5–4.9)

## 2022-04-10 LAB — MAGNESIUM: Magnesium: 2.1 mg/dL (ref 1.7–2.4)

## 2022-04-10 LAB — PHOSPHORUS: Phosphorus: 3.6 mg/dL (ref 2.5–4.6)

## 2022-04-10 MED ORDER — DEXAMETHASONE 4 MG PO TABS
4.0000 mg | ORAL_TABLET | Freq: Every day | ORAL | Status: DC
  Administered 2022-04-10 – 2022-04-12 (×3): 4 mg via ORAL
  Filled 2022-04-10 (×3): qty 1

## 2022-04-10 MED ORDER — INSULIN GLARGINE-YFGN 100 UNIT/ML ~~LOC~~ SOLN
15.0000 [IU] | Freq: Every day | SUBCUTANEOUS | Status: DC
  Administered 2022-04-11 – 2022-04-12 (×2): 15 [IU] via SUBCUTANEOUS
  Filled 2022-04-10 (×2): qty 0.15

## 2022-04-10 MED ORDER — HYDROMORPHONE HCL 1 MG/ML IJ SOLN: 0.5000 mg | Freq: Four times a day (QID) | INTRAMUSCULAR | Status: DC | PRN

## 2022-04-10 MED ORDER — GABAPENTIN 300 MG PO CAPS
300.0000 mg | ORAL_CAPSULE | Freq: Two times a day (BID) | ORAL | Status: DC
  Administered 2022-04-10 – 2022-04-12 (×5): 300 mg via ORAL
  Filled 2022-04-10 (×6): qty 1

## 2022-04-10 MED ORDER — FUROSEMIDE 40 MG PO TABS
40.0000 mg | ORAL_TABLET | Freq: Every day | ORAL | Status: DC
  Administered 2022-04-10: 40 mg via ORAL
  Filled 2022-04-10 (×2): qty 1

## 2022-04-10 NOTE — Progress Notes (Signed)
Palliative Care Progress Note  Pain controlled. PO intake has improved. Skin rash has nearly resolved. Edema resolved. Flu symptoms improving. She has been able to ambulate with a walker with full assist. Considering discharge options.Diarrhea intermittent but chronic issue.  Spoke with her husband by phone at bedside re: care coordination.  Recommendations:  Plan is to discharge to [facility] on Monday for Rehab Maintain Duragesic 50, PO hydromorphone for breakthrough pain Long Steroid taper over next 2-3 weeks Continue T3 and T4 supplementation, recheck Thyroid labs after dc Can remove her from telemetry monitoring. Allow for uninterupted sleep at night. Activity as tolerated and encouraged. Immodium for diarrhea symptoms and probiotic. Vitamin deficiency supplementation, stopped albumin.May need reduction in amount of lasix to avoid volume depletion she appears clinically dry. Will follow prn for any additional palliative care needs.  Lane Hacker, DO Palliative Medicine  Time:35 min

## 2022-04-10 NOTE — Progress Notes (Signed)
PROGRESS NOTE    Carolyn [name]  PYK:[id number] DOB: 27-Mar-1955 DOA: 03/13/2022 PCP: Pcp, No   Brief Narrative: 67 year old female, past medical history of chronic pancreatitis, history of prior alcoholism, gastroesophageal reflux, prior vitamin D deficiency documented. She has known malabsorption issues and has been on Creon. She presents with cellulitis and sepsis related to a ongoing rash of the hands and feet. It appears that the rash has been going on for several weeks continued to get worse. As started off with a small blister the blister then ruptured the skin started to dry out crack and flake. She is starting to flake and desquamate portions of the hands and feet. She developed increased pain and swelling in the right hand that developed redness that tracks into the forearm. She presents to the ER with an elevated white count and was treated with antibiotics for concern of sepsis.  Patient admitted to critical care team subsequently transferred to [facility] service on 01/14/2021 after stabilization noted to be in significant pain, palliative care GI consulted. -Patient noted to have been transfused 2 units packed red blood cells given trending hemoglobin. -Patient seen by GI for further evaluation and also by palliative care who have adjusted pain medications patient started on IV Decadron with improvement with pain management. -Patient also noted to go into volume overload, placed on IV Lasix with good diuresis with adjustment in diuretics. -Patient will also with on calorie count which have improved TPN can be weaned down.  Assessment & Plan:   Principal Problem:   Severe sepsis ([place]) Active Problems:   Severe protein-calorie malnutrition (HCC)   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HCC)   Acute respiratory failure with hypoxia (HCC)   Chronic gastric ulcer with perforation (HCC)   Desquamative dermatitis   Sepsis ([place])   Cellulitis of left lower extremity   Hypophosphatemia   Anemia of  chronic disease   Localized swelling of right upper extremity   NSVT (nonsustained ventricular tachycardia) (HCC)   History of gastric ulcer   Abnormal loss of weight   Aspiration pneumonia of both lungs (HCC)   Acute on chronic diastolic CHF (congestive heart failure) (HCC)   Influenza A   #1 severe sepsis secondary to disqamative dermatitis with superimposed cellulitis and possible underlying osteomyelitis -Patient noted to have presented with complicated nonhealing desquamative dermatitis involving hands and feet complicated by severe sepsis due to cellulitis/wound infection. -MRI of bilateral hands and feet done without any definite evidence of osteomyelitis. -Patient initially admitted to the critical care service, concern for probable vitamin/nutritional deficiencies versus paraneoplastic syndromes. -Concern for possible nutritional deficiencies, B2, B6, tryptophan, zinc, thiamine etc. could lead to this. -Patient noted to be prior alcoholic now with a chronic pancreatitis on Creon and likely has pre-existing condition of malabsorption as well as possible gastric surgery unsure as to what the patient has had this on not however per PCCM. -Patient placed empirically on IV vancomycin, IV Rocephin and subsequently transition to doxycycline and Augmentin and just completed a 10-day course of antibiotic treatment 03/22/2022.  -Patient noted to go into acute hypoxic respiratory failure with concerns for possible aspiration pneumonia and volume overload and placed empirically on IV Unasyn to treat for total of 10 days. -Continue vitamin D3, vitamin B12, zinc, vitamin D3. -Continue vitamin A, selenium. -Will need follow-up with dermatology (we have followed in [name]) -Will need outpatient follow-up with podiatry for bone biopsy to evaluate for osteomyelitis. -Outpatient follow-up with ID was set up for 03/22/2022 however will need  to be rescheduled as patient is still  hospitalized. -Patient unable to ambulate seen by PT would likely require SNF placement. Dilaudid decreased Continue p.o. Dilaudid Continue fentanyl patch at 15 mcg/h -Patient also placed on Decadron 8 mg IV daily and wean down to 4 mg daily and recommended a long taper over several weeks per palliative care. 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  AWAIT SNF on Monday   2.  Acute hypoxic respiratory failure likely secondary to acute on chronic diastolic CHF exacerbation +/- aspiration pneumonia/influenza A -Patient noted to have a bout of nausea and emesis the evening of 03/22/2022, noted to have increased O2 requirements and required 5 L nasal cannula from being on room air the day prior. -Chest x-ray done with new bilateral patchy airspace opacities possibly due to multifocal infection or pulmonary edema, small bilateral pleural effusions. -BNP noted to be elevated at 3564.6 Decrease Lasix to 40 mg daily. Patient is able to maintain oxygen saturation on room air. -Respiratory viral panel positive for influenza A. -Started Tamiflu. -Status post 10 days IV Unasyn. -Continue Mucinex due to coarse breath sounds.     3.  History of chronic alcoholic pancreatitis -Continue Creon.    4.  Hepatic cirrhosis with ascites/transaminitis -Being followed in the outpatient setting by [facility]. -BP soft and as such diuretics held spironolactone and Lasix held early on during the hospitalization. -.CT Abdomen and Pelvis done and showed "Marked diffuse subcutaneous body wall edema. Mild ascites. The constellation of findings can be seen the setting of anasarca. Punctate free intraperitoneal gas within the upper abdomen adjacent to numerous surgical clips involving the distal stomach. A site of bowel perforation is not clearly identified and there is no leakage of oral contrast. Correlation for an exogenous source of gas, as can be seen with paracentesis, is recommended. Mild hepatic steatosis. Changes  in keeping with chronic pancreatitis." -Outpatient follow-up with GI.  5.  Gastric chronic ulcer with contained perforation-she has received 3 units of packed RBCs during this hospital stay. 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   -Continue PPI twice daily, Pepcid twice daily.  -GI consulted.    6.  Hypothyroidism -TSH noted at 33.958 on 03/15/2022. -Continue Synthroid 50 mcg's daily.   -Will need repeat labs in 2-4 weeks in the outpatient setting.     6.  Hypophosphatemia/hypomagnesemia/hypokalemia-labs pending   7.  Severe protein calorie malnutrition-patient received TPN initially and then was weaned off once she was able to taking p.o.  -Continue oral selenium, multivitamin, vitamin B6 100 mg daily, vitamin EE 400 units daily, vitamin A 10,000 units every other day.  -Dietitian following.   8.  Anemia of chronic disease/folate deficiency.  She received 2 units of packed RBC. 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   -CT chest abdomen and pelvis done on 03/24/2022 per GI recommendations with multi focal groundglass pulmonary infiltrates new from 03/15/2022 more prevalent within the lung apices bilaterally possibly related to atypical infection in the acute setting, large right and small left pleural effusions, marked diffuse subcutaneous body wall edema mild ascites can be seen with anasarca.  Punctuate free intraperitoneal gas within the upper abdomen adjacent to  numerous surgical clips involving the distal stomach.  A site of bowel perforation is not clearly identified and no leakage of contrast noted.  Correlate with an exogenous  source of gas as can be seen with paracentesis recommended.  Hepatic steatosis.  Changes of chronic pancreatitis.  No definite superimposed acute peripancreatic inflammatory changes noted. -Patient assessed by GI who reviewed CT scans, discussed patient's prior surgeries and feel at this time patient does not need any further endoscopic evaluation and to follow for now. -Hemoglobin noted to have stabilized at 10.0. -Follow H&H, transfusion threshold hemoglobin  < 8.    9.  Right upper extremity swelling and pain -Improved with upper extremity elevation and on diuretics.   -Upper extremity Dopplers negative for DVT.     10.  NSVT -Patient noted to have 7 beat run of NSVT on 03/19/2022, patient was asymptomatic.     11.  Influenza A -Noted on respiratory viral panel. -Tamiflu.  12 hyperglycemia in the setting of steroids increase Semglee to 15 units daily.  CBG (last 3)  Recent Labs    04/09/22 1708 04/09/22 2131 04/10/22 0721  GLUCAP 271* 282* 237*    Pressure Injury 01/07/22 Coccyx Mid Stage 1 -  Intact skin with non-blanchable redness of a localized area usually over a bony prominence. (Active)  01/07/22 1848  Location: Coccyx  Location Orientation: Mid  Staging: Stage 1 -  Intact skin with non-blanchable redness of a localized area usually over a bony prominence.  Wound Description (Comments):   Present on Admission: Yes  Dressing Type Foam - Lift dressing to assess site every shift 04/09/22 2000     Pressure Injury 03/15/22 Knee Anterior;Right Unstageable - Full thickness tissue loss in which the base of the injury is covered by slough (yellow, tan, gray, green or brown) and/or eschar (tan, brown or black) in the wound bed. blister on knee now open  (Active)  03/15/22   Location: Knee  Location Orientation: Anterior;Right  Staging: Unstageable - Full thickness tissue loss in which the base of the injury is covered by slough (yellow, tan, gray, green or brown) and/or eschar  (tan, brown or black) in the wound bed.  Wound Description (Comments): blister on knee now open with black center  Present on Admission: Yes  Dressing Type Gauze (Comment) 04/09/22 2000      Nutrition Problem: Severe Malnutrition Etiology: chronic illness (chronic pancreatitis, h/o multiple gastric surgeries including gastric bypass)   Signs/Symptoms: severe fat depletion, severe muscle depletion  Interventions: Ensure Enlive (each supplement provides 350kcal and 20 grams of protein), MVI, Tube feeding, Prostat  Estimated body mass index is 16.66 kg/m as calculated from the following:   Height as of this encounter: 5\' 8"  (1.727 m).   Weight as of this encounter: 49.7 kg. DVT prophylaxis: Heparin Code Status: Full Family Communication: Updated patient.  No family at bedside.  Disposition: await snf  Status is: Inpatient Remains inpatient appropriate because: Severity of illness.  Unsafe disposition.   Consultants:  PCCM admission. Infectious disease: Dr. 03/15/2022 Wound care RN 03/14/2022 Palliative care: Dr. 03/16/2022 03/23/2022 Gastroenterology: Dr. 05/22/2022 III 03/23/2022   Procedures:  Plain films of bilateral feet 03/13/2022 Chest x-ray 03/15/2022, 03/26/2022, 03/28/2022, 03/30/2022, 03/31/2022 MRI left foot and right foot 03/13/2022 PICC line placement 03/17/2022 Transfused 2 units packed red blood cells 03/20/2022 Transfuse 1 unit packed red blood cells 03/15/2022 Right upper extremity Dopplers 03/19/2022 CT chest abdomen and pelvis 03/24/2022 Acute abdominal series 03/23/2022    Subjective: Patient  is resting. Palliative care notes reviewed  Objective: Vitals:   04/10/22 0000 04/10/22 0200 04/10/22 0452 04/10/22 0500  BP:   (!) 140/78   Pulse: 69 70    Resp: 13 15    Temp:      TempSrc:      SpO2: 97% 97%    Weight:    49.7 kg  Height:        Intake/Output Summary (Last 24 hours) at 04/10/2022 1002 Last data filed at 04/10/2022 0500 Gross per 24 hour  Intake 840 ml   Output 1600 ml  Net -760 ml    Filed Weights   04/05/22 0500 04/07/22 0500 04/10/22 0500  Weight: 48 kg 49.3 kg 49.7 kg    Examination:frail ill appearing   General exam: Appears in nad Respiratory system: Coarse  to auscultation. Respiratory effort normal. Cardiovascular system: S1 & S2 heard, RRR. No JVD, murmurs, rubs, gallops or clicks. No pedal edema. Gastrointestinal system: Abdomen is nondistended, soft and nontender. No organomegaly or masses felt. Normal bowel sounds heard. Central nervous system: Alert and oriented. No focal neurological deficits. Extremities: edema. Skin: Decreased erythema right upper extremity, severe desquamative dermatitis changes right upper extremity bilateral feet.  Psychiatry: Judgement and insight appear normal. Mood & affect appropriate.     Data Reviewed: I have personally reviewed following labs and imaging studies  CBC: Recent Labs  Lab 04/05/22 0357 04/06/22 0338 04/07/22 0242  WBC 10.9* 11.4* 9.7  NEUTROABS 8.9*  --  8.0*  HGB 9.8* 10.0* 10.2*  HCT 30.6* 31.3* 31.6*  MCV 95.9 94.6 94.0  PLT 224 234 505    Basic Metabolic Panel: Recent Labs  Lab 04/04/22 0500 04/05/22 0357 04/06/22 0338 04/07/22 0242  NA 138 135 134* 133*  K 3.9 3.5 4.0 3.6  CL 98 94* 94* 93*  CO2 28 25 27 26   GLUCOSE 129* 112* 70 179*  BUN 75* 75* 71* 84*  CREATININE 0.74 0.75 0.72 0.72  CALCIUM 9.5 9.8 9.7 10.1  MG 2.4 2.4 2.1 2.4  PHOS 6.1* 5.8* 4.6 5.9*    GFR: Estimated Creatinine Clearance: 55.7 mL/min (by C-G formula based on SCr of 0.72 mg/dL). Liver Function Tests: Recent Labs  Lab 04/05/22 0357 04/06/22 0338 04/07/22 0242  AST 42*  --  32  ALT 32  --  31  ALKPHOS 107  --  98  BILITOT 0.8  --  0.7  PROT 7.9  --  8.2*  ALBUMIN 4.8 4.6 5.4*    No results for input(s): "LIPASE", "AMYLASE" in the last 168 hours. No results for input(s): "AMMONIA" in the last 168 hours. Coagulation Profile: No results for input(s): "INR",  "PROTIME" in the last 168 hours. Cardiac Enzymes: No results for input(s): "CKTOTAL", "CKMB", "CKMBINDEX", "TROPONINI" in the last 168 hours. BNP (last 3 results) No results for input(s): "PROBNP" in the last 8760 hours. HbA1C: No results for input(s): "HGBA1C" in the last 72 hours.  CBG: Recent Labs  Lab 04/09/22 0742 04/09/22 1156 04/09/22 1708 04/09/22 2131 04/10/22 0721  GLUCAP 146* 208* 271* 282* 237*    Lipid Profile: No results for input(s): "CHOL", "HDL", "LDLCALC", "TRIG", "CHOLHDL", "LDLDIRECT" in the last 72 hours. Thyroid Function Tests: No results for input(s): "TSH", "T4TOTAL", "FREET4", "T3FREE", "THYROIDAB" in the last 72 hours. Anemia Panel: No results for input(s): "VITAMINB12", "FOLATE", "FERRITIN", "TIBC", "IRON", "RETICCTPCT" in the last 72 hours. Sepsis Labs: No results for input(s): "PROCALCITON", "LATICACIDVEN" in the last 168 hours.  Recent Results (from the past  240 hour(s))  Respiratory (~20 pathogens) panel by PCR     Status: Abnormal   Collection Time: 04/05/22 11:26 AM   Specimen: Nasopharyngeal Swab; Respiratory  Result Value Ref Range Status   Adenovirus NOT DETECTED NOT DETECTED Final   Coronavirus 229E NOT DETECTED NOT DETECTED Final    Comment: (NOTE) The Coronavirus on the Respiratory Panel, DOES NOT test for the novel  Coronavirus (2019 nCoV)    Coronavirus HKU1 NOT DETECTED NOT DETECTED Final   Coronavirus NL63 NOT DETECTED NOT DETECTED Final   Coronavirus OC43 NOT DETECTED NOT DETECTED Final   Metapneumovirus NOT DETECTED NOT DETECTED Final   Rhinovirus / Enterovirus NOT DETECTED NOT DETECTED Final   Influenza A H1 2009 DETECTED (A) NOT DETECTED Final   Influenza B NOT DETECTED NOT DETECTED Final   Parainfluenza Virus 1 NOT DETECTED NOT DETECTED Final   Parainfluenza Virus 2 NOT DETECTED NOT DETECTED Final   Parainfluenza Virus 3 NOT DETECTED NOT DETECTED Final   Parainfluenza Virus 4 NOT DETECTED NOT DETECTED Final    Respiratory Syncytial Virus NOT DETECTED NOT DETECTED Final   Bordetella pertussis NOT DETECTED NOT DETECTED Final   Bordetella Parapertussis NOT DETECTED NOT DETECTED Final   Chlamydophila pneumoniae NOT DETECTED NOT DETECTED Final   Mycoplasma pneumoniae NOT DETECTED NOT DETECTED Final    Comment: Performed at [facility], [address], [place], [place] [address]  Group A Strep by PCR     Status: None   Collection Time: 04/05/22 11:27 AM   Specimen: Throat; Sterile Swab  Result Value Ref Range Status   Group A Strep by PCR NOT DETECTED NOT DETECTED Final    Comment: Performed at [facility], [address] [place], [place] [address]         Radiology Studies: No results found.      Scheduled Meds:  (feeding supplement) PROSource Plus  30 mL Oral BID BM   acetaminophen  1,000 mg Oral TID   bupivacaine(PF)  30 mL Infiltration Once   Chlorhexidine Gluconate Cloth  6 each Topical Daily   cholecalciferol  1,000 Units Oral Daily   dexamethasone  4 mg Oral Daily   dextromethorphan-guaiFENesin  1 tablet Oral BID   enoxaparin (LOVENOX) injection  40 mg Subcutaneous Q24H   famotidine  10 mg Oral BID   feeding supplement  237 mL Oral TID BM   fentaNYL  1 patch Transdermal Q72H   fluconazole  200 mg Oral Daily   furosemide  40 mg Oral Daily   gabapentin  300 mg Oral BID   insulin aspart  0-15 Units Subcutaneous TID WC   insulin aspart  3 Units Subcutaneous TID WC   insulin glargine-yfgn  12 Units Subcutaneous Daily   levothyroxine  50 mcg Oral Q0600   liothyronine  25 mcg Oral Daily   lipase/protease/amylase  24,000 Units Oral TID WC   metoprolol tartrate  25 mg Oral BID   multivitamin with minerals  1 tablet Oral Daily   mupirocin cream   Topical Once   oseltamivir  30 mg Oral BID   pantoprazole  40 mg Oral BID   phenobarbital  16.2 mg Oral QHS   pyridOXINE  100 mg Oral Daily   saccharomyces boulardii  250 mg Oral BID   selenium  100 mcg Oral  Daily   silver sulfADIAZINE   Topical Daily   sodium chloride flush  10-40 mL Intracatheter Q12H   vitamin A  10,000 Units Oral  Q48H   vitamin B-12  100 mcg Oral Daily   vitamin E  400 Units Oral Daily   Continuous Infusions:     LOS: 27 days    Time spent:3 7 min  Alwyn Ren, MD  04/10/2022, 10:02 AM

## 2022-04-11 ENCOUNTER — Inpatient Hospital Stay (HOSPITAL_COMMUNITY): Payer: Medicare PPO

## 2022-04-11 DIAGNOSIS — A419 Sepsis, unspecified organism: Secondary | ICD-10-CM | POA: Diagnosis not present

## 2022-04-11 DIAGNOSIS — R6520 Severe sepsis without septic shock: Secondary | ICD-10-CM | POA: Diagnosis not present

## 2022-04-11 LAB — GLUCOSE, CAPILLARY
Glucose-Capillary: 129 mg/dL — ABNORMAL HIGH (ref 70–99)
Glucose-Capillary: 144 mg/dL — ABNORMAL HIGH (ref 70–99)
Glucose-Capillary: 227 mg/dL — ABNORMAL HIGH (ref 70–99)
Glucose-Capillary: 266 mg/dL — ABNORMAL HIGH (ref 70–99)
Glucose-Capillary: 425 mg/dL — ABNORMAL HIGH (ref 70–99)

## 2022-04-11 MED ORDER — FUROSEMIDE 20 MG PO TABS: 20.0000 mg | ORAL_TABLET | Freq: Every day | ORAL | Status: DC

## 2022-04-11 MED ORDER — INSULIN ASPART 100 UNIT/ML IJ SOLN
6.0000 [IU] | INTRAMUSCULAR | Status: AC
  Administered 2022-04-11: 6 [IU] via SUBCUTANEOUS

## 2022-04-11 MED ORDER — HYDROMORPHONE HCL 2 MG PO TABS
2.0000 mg | ORAL_TABLET | ORAL | Status: DC | PRN
  Administered 2022-04-11 – 2022-04-12 (×2): 2 mg via ORAL
  Filled 2022-04-11 (×2): qty 1

## 2022-04-11 NOTE — TOC Progression Note (Signed)
Transition of Care [facility]) - Progression Note    Patient Details  Name: Carolyn [name] MRN: [id number] Date of Birth: 08-10-1955  Transition of Care [facility]) CM/SW [place], LCSW Phone Number: 04/11/2022, 1:09 PM  Clinical Narrative:     Pt will need updated PT note for auth.TOC to follow  Expected Discharge Plan: [place] Barriers to Discharge: Continued Medical Work up  Expected Discharge Plan and Services   Discharge Planning Services: CM Consult Post Acute Care Choice: [place] arrangements for the past 2 months: Single Family Home                                       Social Determinants of Health (SDOH) Interventions SDOH Screenings   Food Insecurity: No Food Insecurity (03/15/2022)  Housing: Low Risk  (03/15/2022)  Transportation Needs: No Transportation Needs (03/15/2022)  Utilities: At Risk (03/15/2022)  Depression (PHQ2-9): Low Risk  (07/22/2020)  Tobacco Use: Low Risk  (04/06/2022)    Readmission Risk Interventions     No data to display

## 2022-04-11 NOTE — Progress Notes (Signed)
PROGRESS NOTE    Carolyn [name]  LZJ:[id number] DOB: October 06, 1955 DOA: 03/13/2022 PCP: Pcp, No   Brief Narrative: 67 year old female, past medical history of chronic pancreatitis, history of prior alcoholism, gastroesophageal reflux, prior vitamin D deficiency documented. She has known malabsorption issues and has been on Creon. She presents with cellulitis and sepsis related to a ongoing rash of the hands and feet. It appears that the rash has been going on for several weeks continued to get worse. As started off with a small blister the blister then ruptured the skin started to dry out crack and flake. She is starting to flake and desquamate portions of the hands and feet. She developed increased pain and swelling in the right hand that developed redness that tracks into the forearm. She presents to the ER with an elevated white count and was treated with antibiotics for concern of sepsis.  Patient admitted to critical care team subsequently transferred to [facility] service on 01/14/2021 after stabilization noted to be in significant pain, palliative care GI consulted. -Patient noted to have been transfused 2 units packed red blood cells given trending hemoglobin. -Patient seen by GI for further evaluation and also by palliative care who have adjusted pain medications patient started on IV Decadron with improvement with pain management. -Patient also noted to go into volume overload, placed on IV Lasix with good diuresis with adjustment in diuretics. -Patient will also with on calorie count which have improved TPN can be weaned down.  Assessment & Plan:   Principal Problem:   Severe sepsis (HCC) Active Problems:   Severe protein-calorie malnutrition (HCC)   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HCC)   Acute respiratory failure with hypoxia (HCC)   Chronic gastric ulcer with perforation (HCC)   Desquamative dermatitis   Sepsis (HCC)   Cellulitis of left lower extremity   Hypophosphatemia   Anemia of  chronic disease   Localized swelling of right upper extremity   NSVT (nonsustained ventricular tachycardia) (HCC)   History of gastric ulcer   Abnormal loss of weight   Aspiration pneumonia of both lungs (HCC)   Acute on chronic diastolic CHF (congestive heart failure) (HCC)   Influenza A   #1 severe sepsis secondary to disqamative dermatitis with superimposed cellulitis and possible underlying osteomyelitis -Patient noted to have presented with complicated nonhealing desquamative dermatitis involving hands and feet complicated by severe sepsis due to cellulitis/wound infection. -MRI of bilateral hands and feet done without any definite evidence of osteomyelitis. -Patient initially admitted to the critical care service, concern for probable vitamin/nutritional deficiencies versus paraneoplastic syndromes. -Concern for possible nutritional deficiencies, B2, B6, tryptophan, zinc, thiamine etc. could lead to this. -Patient noted to be prior alcoholic now with a chronic pancreatitis on Creon and likely has pre-existing condition of malabsorption as well as possible gastric surgery unsure as to what the patient has had this on not however per PCCM. -Patient placed empirically on IV vancomycin, IV Rocephin and subsequently transition to doxycycline and Augmentin completed a 10-day course of antibiotic treatment 03/22/2022.  -Patient noted to go into acute hypoxic respiratory failure with concerns for possible aspiration pneumonia and volume overload and placed empirically on IV Unasyn to treat for total of 10 days. -Continue vitamin D3, vitamin B12, zinc, vitamin D3. -Continue vitamin A, selenium. -Will need follow-up with dermatology (we have followed in [place]) -Will need outpatient follow-up with podiatry for bone biopsy to evaluate for osteomyelitis. -Outpatient follow-up with ID was set up for 03/22/2022 however will need to be  rescheduled as patient is still  hospitalized. -Patient unable to ambulate seen by PT would likely require SNF placement. Dilaudid decreased Continue p.o. Dilaudid Continue fentanyl patch at 50 mcg/h -Patient also placed on Decadron 8 mg IV daily and wean down to 4 mg daily and recommended a long taper over several weeks per palliative care. 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  AWAIT SNF on Monday   2.  Acute hypoxic respiratory failure likely secondary to acute on chronic diastolic CHF exacerbation +/- aspiration pneumonia/influenza A -Patient noted to have a bout of nausea and emesis the evening of 03/22/2022, noted to have increased O2 requirements and required 5 L nasal cannula from being on room air the day prior. -Chest x-ray done with new bilateral patchy airspace opacities possibly due to multifocal infection or pulmonary edema, small bilateral pleural effusions. Patient is able to maintain oxygen saturation on room air. -Respiratory viral panel positive for influenza A. -Started Tamiflu. -Status post 10 days IV Unasyn. -Continue Mucinex due to coarse breath sounds.     3.  History of chronic alcoholic pancreatitis -Continue Creon.    4.  Hepatic cirrhosis with ascites/transaminitis -Being followed in the outpatient setting by [facility]. -BP soft and as such diuretics held spironolactone and Lasix held early on during the hospitalization. -.CT Abdomen and Pelvis done and showed "Marked diffuse subcutaneous body wall edema. Mild ascites. The constellation of findings can be seen the setting of anasarca. Punctate free intraperitoneal gas within the upper abdomen adjacent to numerous surgical clips involving the distal stomach. A site of bowel perforation is not clearly identified and there is no leakage of oral contrast. Correlation for an exogenous source of gas, as can be seen with paracentesis, is recommended. Mild hepatic steatosis. Changes in keeping with chronic pancreatitis." -Outpatient follow-up with  GI.  5.  Gastric chronic ulcer with contained perforation-she has received 3 units of packed RBCs during this hospital stay. 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   -Continue PPI twice daily, Pepcid twice daily.  -GI consulted.    6.  Hypothyroidism -TSH noted at 33.958 on 03/15/2022. -Continue Synthroid 50 mcg's daily.   -Will need repeat labs in 2-4 weeks in the outpatient setting.     6.  Hypophosphatemia/hypomagnesemia/hypokalemia-labs pending   7.  Severe protein calorie malnutrition-patient received TPN initially and then was weaned off once she was able to taking p.o.  -Continue oral selenium, multivitamin, vitamin B6 100 mg daily, vitamin EE 400 units daily, vitamin A 10,000 units every other day.  -Dietitian following.   8.  Anemia of chronic disease/folate deficiency.  She received 2 units of packed RBC. 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   -CT chest abdomen and pelvis done on 03/24/2022 per GI recommendations with multi focal groundglass pulmonary infiltrates new from 03/15/2022 more prevalent within the lung apices bilaterally possibly related to atypical infection in the acute setting, large right and small left pleural effusions, marked diffuse subcutaneous body wall edema mild ascites can be seen with anasarca.  Punctuate free intraperitoneal gas within the upper abdomen adjacent to numerous surgical clips involving the distal stomach.  A site of bowel perforation is not  clearly identified and no leakage of contrast noted.  Correlate with an exogenous source of gas as can be seen with paracentesis recommended.   Hepatic steatosis.  Changes of chronic pancreatitis.  No definite superimposed acute peripancreatic inflammatory changes noted. -Patient assessed by GI who reviewed CT scans, discussed patient's prior surgeries and feel at this time patient does not need any further endoscopic evaluation and to follow for now. -Hemoglobin noted to have stabilized at 10.0. -Follow H&H, transfusion threshold hemoglobin  < 8.    9.  Right upper extremity swelling and pain -Improved with upper extremity elevation and on diuretics.   -Upper extremity Dopplers negative for DVT.     10.  NSVT -Patient noted to have 7 beat run of NSVT on 03/19/2022, patient was asymptomatic.   Electrolytes normal   11.  Influenza A -Noted on respiratory viral panel. -Tamiflu.  12 hyperglycemia in the setting of steroids increase Semglee to 15 units daily.  CBG (last 3)  Recent Labs    04/10/22 2129 04/11/22 0058 04/11/22 0749  GLUCAP 461* 425* 129*     Pressure Injury 01/07/22 Coccyx Mid Stage 1 -  Intact skin with non-blanchable redness of a localized area usually over a bony prominence. (Active)  01/07/22 1848  Location: Coccyx  Location Orientation: Mid  Staging: Stage 1 -  Intact skin with non-blanchable redness of a localized area usually over a bony prominence.  Wound Description (Comments):   Present on Admission: Yes  Dressing Type Foam - Lift dressing to assess site every shift 04/10/22 2155     Pressure Injury 03/15/22 Knee Anterior;Right Unstageable - Full thickness tissue loss in which the base of the injury is covered by slough (yellow, tan, gray, green or brown) and/or eschar (tan, brown or black) in the wound bed. blister on knee now open  (Active)  03/15/22   Location: Knee  Location Orientation: Anterior;Right  Staging: Unstageable - Full thickness tissue loss in which the base of the injury is covered by slough (yellow, tan, gray, green or brown) and/or eschar (tan, brown or black) in the wound bed.   Wound Description (Comments): blister on knee now open with black center  Present on Admission: Yes  Dressing Type Gauze (Comment) 04/10/22 2155      Nutrition Problem: Severe Malnutrition Etiology: chronic illness (chronic pancreatitis, h/o multiple gastric surgeries including gastric bypass)   Signs/Symptoms: severe fat depletion, severe muscle depletion  Interventions: Ensure Enlive (each supplement provides 350kcal and 20 grams of protein), MVI, Tube feeding, Prostat  Estimated body mass index is 16.59 kg/m as calculated from the following:   Height as of this encounter: 5\' 8"  (1.727 m).   Weight as of this encounter: 49.5 kg. DVT prophylaxis: Heparin Code Status: Full Family Communication: Updated patient.  No family at bedside.  Disposition: await snf  Status is: Inpatient Remains inpatient appropriate because: Severity of illness.  Unsafe disposition.   Consultants:  PCCM admission. Infectious disease: Dr. Candiss Norse 03/15/2022 Wound care RN 03/14/2022 Palliative care: Dr. Hilma Favors 03/23/2022 Gastroenterology: Dr. Loletha Carrow III 03/23/2022   Procedures:  Plain films of bilateral feet 03/13/2022 Chest x-ray 03/15/2022, 03/26/2022, 03/28/2022, 03/30/2022, 03/31/2022 MRI left foot and right foot 03/13/2022 PICC line placement 03/17/2022 Transfused 2 units packed red blood cells 03/20/2022 Transfuse 1 unit packed red blood cells 03/15/2022 Right upper extremity Dopplers 03/19/2022 CT chest abdomen and pelvis 03/24/2022 Acute abdominal series 03/23/2022    Subjective: Patient is resting. Palliative care notes reviewed  Objective: Vitals:   04/10/22  1145 04/10/22 2130 04/11/22 0500 04/11/22 0533  BP:  138/89  139/86  Pulse: 88 86  74  Resp: 15 18  17   Temp: 98 F (36.7 C) 98.1 F (36.7 C)  98 F (36.7 C)  TempSrc: Oral Oral    SpO2: 98% 97%  100%  Weight:   49.5 kg   Height:        Intake/Output Summary (Last 24 hours) at 04/11/2022 0931 Last data filed at 04/11/2022 0100 Gross per  24 hour  Intake 890 ml  Output 1880 ml  Net -990 ml    Filed Weights   04/07/22 0500 04/10/22 0500 04/11/22 0500  Weight: 49.3 kg 49.7 kg 49.5 kg    Examination:frail ill appearing   General exam: Appears in nad Respiratory system: Coarse  to auscultation. Respiratory effort normal. Cardiovascular system: S1 & S2 heard, RRR. No JVD, murmurs, rubs, gallops or clicks. No pedal edema. Gastrointestinal system: Abdomen is nondistended, soft and nontender. No organomegaly or masses felt. Normal bowel sounds heard. Central nervous system: Alert and oriented. No focal neurological deficits. Extremities: edema. Skin: Decreased erythema right upper extremity, severe desquamative dermatitis changes right upper extremity bilateral feet.  Psychiatry: Judgement and insight appear normal. Mood & affect appropriate.     Data Reviewed: I have personally reviewed following labs and imaging studies  CBC: Recent Labs  Lab 04/05/22 0357 04/06/22 0338 04/07/22 0242 04/10/22 1025  WBC 10.9* 11.4* 9.7 13.0*  NEUTROABS 8.9*  --  8.0*  --   HGB 9.8* 10.0* 10.2* 12.0  HCT 30.6* 31.3* 31.6* 36.1  MCV 95.9 94.6 94.0 92.1  PLT 224 234 258 262    Basic Metabolic Panel: Recent Labs  Lab 04/05/22 0357 04/06/22 0338 04/07/22 0242 04/10/22 1025  NA 135 134* 133* 131*  K 3.5 4.0 3.6 3.9  CL 94* 94* 93* 88*  CO2 25 27 26 28   GLUCOSE 112* 70 179* 136*  BUN 75* 71* 84* 86*  CREATININE 0.75 0.72 0.72 0.72  CALCIUM 9.8 9.7 10.1 10.2  MG 2.4 2.1 2.4 2.1  PHOS 5.8* 4.6 5.9* 3.6    GFR: Estimated Creatinine Clearance: 55.5 mL/min (by C-G formula based on SCr of 0.72 mg/dL). Liver Function Tests: Recent Labs  Lab 04/05/22 0357 04/06/22 0338 04/07/22 0242 04/10/22 1025  AST 42*  --  32 47*  ALT 32  --  31 46*  ALKPHOS 107  --  98 88  BILITOT 0.8  --  0.7 0.7  PROT 7.9  --  8.2* 8.4*  ALBUMIN 4.8 4.6 5.4* 5.3*    No results for input(s): "LIPASE", "AMYLASE" in the last 168 hours. No  results for input(s): "AMMONIA" in the last 168 hours. Coagulation Profile: No results for input(s): "INR", "PROTIME" in the last 168 hours. Cardiac Enzymes: No results for input(s): "CKTOTAL", "CKMB", "CKMBINDEX", "TROPONINI" in the last 168 hours. BNP (last 3 results) No results for input(s): "PROBNP" in the last 8760 hours. HbA1C: No results for input(s): "HGBA1C" in the last 72 hours.  CBG: Recent Labs  Lab 04/10/22 1132 04/10/22 1602 04/10/22 2129 04/11/22 0058 04/11/22 0749  GLUCAP 166* 238* 461* 425* 129*    Lipid Profile: No results for input(s): "CHOL", "HDL", "LDLCALC", "TRIG", "CHOLHDL", "LDLDIRECT" in the last 72 hours. Thyroid Function Tests: No results for input(s): "TSH", "T4TOTAL", "FREET4", "T3FREE", "THYROIDAB" in the last 72 hours. Anemia Panel: No results for input(s): "VITAMINB12", "FOLATE", "FERRITIN", "TIBC", "IRON", "RETICCTPCT" in the last 72 hours. Sepsis Labs: No results for  input(s): "PROCALCITON", "LATICACIDVEN" in the last 168 hours.  Recent Results (from the past 240 hour(s))  Respiratory (~20 pathogens) panel by PCR     Status: Abnormal   Collection Time: 04/05/22 11:26 AM   Specimen: Nasopharyngeal Swab; Respiratory  Result Value Ref Range Status   Adenovirus NOT DETECTED NOT DETECTED Final   Coronavirus 229E NOT DETECTED NOT DETECTED Final    Comment: (NOTE) The Coronavirus on the Respiratory Panel, DOES NOT test for the novel  Coronavirus (2019 nCoV)    Coronavirus HKU1 NOT DETECTED NOT DETECTED Final   Coronavirus NL63 NOT DETECTED NOT DETECTED Final   Coronavirus OC43 NOT DETECTED NOT DETECTED Final   Metapneumovirus NOT DETECTED NOT DETECTED Final   Rhinovirus / Enterovirus NOT DETECTED NOT DETECTED Final   Influenza A H1 2009 DETECTED (A) NOT DETECTED Final   Influenza B NOT DETECTED NOT DETECTED Final   Parainfluenza Virus 1 NOT DETECTED NOT DETECTED Final   Parainfluenza Virus 2 NOT DETECTED NOT DETECTED Final   Parainfluenza  Virus 3 NOT DETECTED NOT DETECTED Final   Parainfluenza Virus 4 NOT DETECTED NOT DETECTED Final   Respiratory Syncytial Virus NOT DETECTED NOT DETECTED Final   Bordetella pertussis NOT DETECTED NOT DETECTED Final   Bordetella Parapertussis NOT DETECTED NOT DETECTED Final   Chlamydophila pneumoniae NOT DETECTED NOT DETECTED Final   Mycoplasma pneumoniae NOT DETECTED NOT DETECTED Final    Comment: Performed at [facility], [address] [place], [place] [address]  Group A Strep by PCR     Status: None   Collection Time: 04/05/22 11:27 AM   Specimen: Throat; Sterile Swab  Result Value Ref Range Status   Group A Strep by PCR NOT DETECTED NOT DETECTED Final    Comment: Performed at [facility], [place] [address] [place], [place] [address]         Radiology Studies: No results found.      Scheduled Meds:  (feeding supplement) PROSource Plus  30 mL Oral BID BM   acetaminophen  1,000 mg Oral TID   bupivacaine(PF)  30 mL Infiltration Once   Chlorhexidine Gluconate Cloth  6 each Topical Daily   cholecalciferol  1,000 Units Oral Daily   dexamethasone  4 mg Oral Daily   dextromethorphan-guaiFENesin  1 tablet Oral BID   enoxaparin (LOVENOX) injection  40 mg Subcutaneous Q24H   famotidine  10 mg Oral BID   feeding supplement  237 mL Oral TID BM   fentaNYL  1 patch Transdermal Q72H   fluconazole  200 mg Oral Daily   furosemide  40 mg Oral Daily   gabapentin  300 mg Oral BID   insulin aspart  0-15 Units Subcutaneous TID WC   insulin aspart  3 Units Subcutaneous TID WC   insulin glargine-yfgn  15 Units Subcutaneous Daily   levothyroxine  50 mcg Oral Q0600   liothyronine  25 mcg Oral Daily   lipase/protease/amylase  24,000 Units Oral TID WC   metoprolol tartrate  25 mg Oral BID   multivitamin with minerals  1 tablet Oral Daily   mupirocin cream   Topical Once   pantoprazole  40 mg Oral BID   phenobarbital  16.2 mg Oral QHS   pyridOXINE  100 mg Oral Daily    saccharomyces boulardii  250 mg Oral BID   selenium  100 mcg Oral Daily   silver sulfADIAZINE   Topical Daily   sodium chloride flush  10-40 mL Intracatheter Q12H  vitamin A  10,000 Units Oral Q48H   vitamin B-12  100 mcg Oral Daily   vitamin E  400 Units Oral Daily   Continuous Infusions:     LOS: 28 days    Time spent:3 7 min  Alwyn Ren, MD  04/11/2022, 9:31 AM

## 2022-04-11 NOTE — TOC Progression Note (Addendum)
Transition of Care [facility]) - Progression Note    Patient Details  Name: Carolyn [name] MRN: [id number] Date of Birth: 1955/07/31  [facility] CM/SW [place], RN Phone Number: 04/11/2022, 4:27 PM  Clinical Narrative:   Insurance authorization ([employer] [place] [id number]) has been initiated and approved for short term SNF at [facility] via [place] (04/11/2022- 04/14/2022).  Notified Whitney with [facility] of auth approval, awaiting response on bed availability.  TOC will continue to follow    Expected Discharge Plan: [place] Barriers to Discharge: Continued Medical Work up  Expected Discharge Plan and Services   Discharge Planning Services: CM Consult Post Acute Care Choice: [place] arrangements for the past 2 months: Single Family Home                                       Social Determinants of Health (SDOH) Interventions SDOH Screenings   Food Insecurity: No Food Insecurity (03/15/2022)  Housing: Low Risk  (03/15/2022)  Transportation Needs: No Transportation Needs (03/15/2022)  Utilities: At Risk (03/15/2022)  Depression (PHQ2-9): Low Risk  (07/22/2020)  Tobacco Use: Low Risk  (04/06/2022)    Readmission Risk Interventions     No data to display

## 2022-04-11 NOTE — Progress Notes (Signed)
Physical Therapy Treatment Patient Details Name: Carolyn [name] MRN: [id number] DOB: Mar 29, 1955 Today's Date: 04/11/2022   History of Present Illness 67 year old female with history of chronic pancreatitis, chronic pain syndrome, GERD, severe malnutrition, osteoporosis who presented to [facility] ED with LE "cellulitis due to breakdown from desquammating wounds" involving bil feet and to lesser extent hands.  Pt admitted 03/13/22 for severe sepsis secondary to discriminative dermatitis with superimposed cellulitis and possible underlying osteomyelitis    PT Comments    Pt had pain meds prior to session and was a little bit lethargic, but motivated and still able to perform bed mobility with MIN A and stand with MAX of 2.  Performed side stepping with A and RW.  Con't to recommend SNF for rehab.    Recommendations for follow up therapy are one component of a multi-disciplinary discharge planning process, led by the attending physician.  Recommendations may be updated based on patient status, additional functional criteria and insurance authorization.  Follow Up Recommendations  Skilled nursing-short term rehab (<3 hours/day) Can patient physically be transported by private vehicle: No   Assistance Recommended at Discharge Frequent or constant Supervision/Assistance  Patient can return home with the following Two people to help with walking and/or transfers;A lot of help with bathing/dressing/bathroom;Assistance with cooking/housework;Assist for transportation;Help with stairs or ramp for entrance   Equipment Recommendations  None recommended by PT    Recommendations for Other Services       Precautions / Restrictions Precautions Precautions: Fall Restrictions Weight Bearing Restrictions: No     Mobility  Bed Mobility Overal bed mobility: Needs Assistance Bed Mobility: Rolling, Sidelying to Sit, Sit to Supine Rolling: Min assist Sidelying to sit: Min assist   Sit to supine: Min assist    General bed mobility comments: cues for hand placement and for processing which seemed a bit slow today-maybe due to meds    Transfers Overall transfer level: Needs assistance Equipment used: Rolling walker (2 wheels) Transfers: Sit to/from Stand Sit to Stand: Max assist, +2 physical assistance           General transfer comment: cues to extend hips, extend knees and get heels onthe ground.  Tendency to sink down into flexed hip/knee position.    Ambulation/Gait Ambulation/Gait assistance: Max assist, +2 physical assistance Gait Distance (Feet): 2 Feet Assistive device: Rolling walker (2 wheels) Gait Pattern/deviations: Step-to pattern       General Gait Details: side stepping to [facility] with MAX A of 2 with A for RW.  On last step, she needed physical A for her leg.   Stairs             Wheelchair Mobility    Modified Rankin (Stroke Patients Only)       Balance Overall balance assessment: Needs assistance Sitting-balance support: Feet supported Sitting balance-Leahy Scale: Poor Sitting balance - Comments: cues to keep feet on the ground to help balance self and decrease posterior lean Postural control: Posterior lean Standing balance support: Bilateral upper extremity supported, Reliant on assistive device for balance Standing balance-Leahy Scale: Poor                              Cognition Arousal/Alertness: Lethargic, Suspect due to medications Behavior During Therapy: WFL for tasks assessed/performed Overall Cognitive Status: Impaired/Different from baseline Area of Impairment: Problem solving  Problem Solving: Slow processing          Exercises      General Comments        Pertinent Vitals/Pain Pain Assessment Pain Assessment: Faces Faces Pain Scale: Hurts little more Pain Location: B feet and hands Pain Descriptors / Indicators: Tender, Discomfort Pain Intervention(s): Limited activity  within patient's tolerance, Premedicated before session, Monitored during session    Home Living                          Prior Function            PT Goals (current goals can now be found in the care plan section) Acute Rehab PT Goals Patient Stated Goal: Less pain, regain IND, be home with her 3 dogs PT Goal Formulation: With patient Time For Goal Achievement: 04/12/22 Potential to Achieve Goals: Fair Progress towards PT goals: Progressing toward goals    Frequency    Min 2X/week      PT Plan Current plan remains appropriate    Co-evaluation              AM-PAC PT "6 Clicks" Mobility   Outcome Measure  Help needed turning from your back to your side while in a flat bed without using bedrails?: A Little Help needed moving from lying on your back to sitting on the side of a flat bed without using bedrails?: A Little Help needed moving to and from a bed to a chair (including a wheelchair)?: A Lot Help needed standing up from a chair using your arms (e.g., wheelchair or bedside chair)?: A Lot Help needed to walk in hospital room?: Total Help needed climbing 3-5 steps with a railing? : Total 6 Click Score: 12    End of Session   Activity Tolerance: Patient limited by lethargy Patient left: in bed;with call bell/phone within reach Nurse Communication: Mobility status;Other (comment) PT Visit Diagnosis: Difficulty in walking, not elsewhere classified (R26.2) Pain - part of body: Hand;Ankle and joints of foot;Leg     Time: 1351-1411 PT Time Calculation (min) (ACUTE ONLY): 20 min  Charges:  $Therapeutic Activity: 8-22 mins                     Lydia Guiles., PT Office [phone] Acute Rehab 04/11/2022    Galen Manila 04/11/2022, 2:22 PM

## 2022-04-12 DIAGNOSIS — A419 Sepsis, unspecified organism: Secondary | ICD-10-CM | POA: Diagnosis not present

## 2022-04-12 DIAGNOSIS — R6520 Severe sepsis without septic shock: Secondary | ICD-10-CM | POA: Diagnosis not present

## 2022-04-12 LAB — BASIC METABOLIC PANEL
Anion gap: 10 (ref 5–15)
BUN: 51 mg/dL — ABNORMAL HIGH (ref 8–23)
CO2: 28 mmol/L (ref 22–32)
Calcium: 9.5 mg/dL (ref 8.9–10.3)
Chloride: 92 mmol/L — ABNORMAL LOW (ref 98–111)
Creatinine, Ser: 0.55 mg/dL (ref 0.44–1.00)
GFR, Estimated: >60 mL/min (ref >60)
Glucose, Bld: 109 mg/dL — ABNORMAL HIGH (ref 70–99)
Potassium: 3.9 mmol/L (ref 3.5–5.1)
Sodium: 130 mmol/L — ABNORMAL LOW (ref 135–145)

## 2022-04-12 LAB — GLUCOSE, CAPILLARY
Glucose-Capillary: 124 mg/dL — ABNORMAL HIGH (ref 70–99)
Glucose-Capillary: 267 mg/dL — ABNORMAL HIGH (ref 70–99)

## 2022-04-12 MED ORDER — VITAMIN D3 25 MCG PO TABS: 1000.0000 [IU] | ORAL_TABLET | Freq: Every day | ORAL | Status: DC

## 2022-04-12 MED ORDER — LIOTHYRONINE SODIUM 25 MCG PO TABS: 25.0000 ug | ORAL_TABLET | Freq: Every day | ORAL | Status: DC

## 2022-04-12 MED ORDER — PROSOURCE PLUS PO LIQD: 30.0000 mL | Freq: Two times a day (BID) | ORAL | Status: DC

## 2022-04-12 MED ORDER — HYDROMORPHONE HCL 2 MG PO TABS: 2.0000 mg | ORAL_TABLET | ORAL | 0 refills | Status: DC | PRN

## 2022-04-12 MED ORDER — POTASSIUM CHLORIDE CRYS ER 20 MEQ PO TBCR: 20.0000 meq | EXTENDED_RELEASE_TABLET | Freq: Every day | ORAL | 0 refills | Status: DC

## 2022-04-12 MED ORDER — FLUCONAZOLE 200 MG PO TABS: 200.0000 mg | ORAL_TABLET | Freq: Every day | ORAL | 0 refills | Status: DC

## 2022-04-12 MED ORDER — GABAPENTIN 300 MG PO CAPS: 300.0000 mg | ORAL_CAPSULE | Freq: Two times a day (BID) | ORAL | 1 refills | Status: DC

## 2022-04-12 MED ORDER — PHENOBARBITAL 16.2 MG PO TABS: 16.5000 mg | ORAL_TABLET | Freq: Every day | ORAL | 0 refills | Status: DC

## 2022-04-12 MED ORDER — LEVOTHYROXINE SODIUM 50 MCG PO TABS: 50.0000 ug | ORAL_TABLET | Freq: Every day | ORAL | Status: DC

## 2022-04-12 MED ORDER — VITAMIN E 180 MG (400 UNIT) PO CAPS: 400.0000 [IU] | ORAL_CAPSULE | Freq: Every day | ORAL | Status: DC

## 2022-04-12 MED ORDER — SACCHAROMYCES BOULARDII 250 MG PO CAPS: 250.0000 mg | ORAL_CAPSULE | Freq: Two times a day (BID) | ORAL | Status: DC

## 2022-04-12 MED ORDER — SILVER SULFADIAZINE 1 % EX CREA: TOPICAL_CREAM | Freq: Every day | CUTANEOUS | 0 refills | Status: DC

## 2022-04-12 MED ORDER — PYRIDOXINE HCL 100 MG PO TABS: 100.0000 mg | ORAL_TABLET | Freq: Every day | ORAL | Status: AC

## 2022-04-12 MED ORDER — SELENIUM 100 MCG PO TABS: 100.0000 ug | ORAL_TABLET | Freq: Every day | ORAL | 0 refills | Status: DC

## 2022-04-12 MED ORDER — MUPIROCIN CALCIUM 2 % EX CREA: TOPICAL_CREAM | Freq: Once | CUTANEOUS | 0 refills | Status: AC

## 2022-04-12 MED ORDER — INSULIN GLARGINE-YFGN 100 UNIT/ML ~~LOC~~ SOLN: 15.0000 [IU] | Freq: Every day | SUBCUTANEOUS | 11 refills | Status: DC

## 2022-04-12 MED ORDER — ACETAMINOPHEN 325 MG PO TABS: 650.0000 mg | ORAL_TABLET | ORAL | Status: DC | PRN

## 2022-04-12 MED ORDER — INSULIN ASPART 100 UNIT/ML IJ SOLN: 3.0000 [IU] | Freq: Three times a day (TID) | INTRAMUSCULAR | 11 refills | Status: DC

## 2022-04-12 MED ORDER — GABAPENTIN 300 MG PO CAPS: 300.0000 mg | ORAL_CAPSULE | Freq: Two times a day (BID) | ORAL | Status: DC

## 2022-04-12 MED ORDER — VITAMIN A 3 MG (10000 UNIT) PO CAPS: 10000.0000 [IU] | ORAL_CAPSULE | ORAL | Status: AC

## 2022-04-12 MED ORDER — ENSURE ENLIVE PO LIQD: 237.0000 mL | Freq: Three times a day (TID) | ORAL | 12 refills | Status: AC

## 2022-04-12 MED ORDER — METOPROLOL TARTRATE 25 MG PO TABS: 25.0000 mg | ORAL_TABLET | Freq: Two times a day (BID) | ORAL | Status: DC

## 2022-04-12 MED ORDER — FENTANYL 50 MCG/HR TD PT72: 1.0000 | MEDICATED_PATCH | TRANSDERMAL | 0 refills | Status: DC

## 2022-04-12 MED ORDER — LOPERAMIDE HCL 2 MG PO CAPS: 2.0000 mg | ORAL_CAPSULE | Freq: Four times a day (QID) | ORAL | 0 refills | Status: DC | PRN

## 2022-04-12 MED ORDER — DEXAMETHASONE 4 MG PO TABS: ORAL_TABLET | ORAL | 0 refills | Status: DC

## 2022-04-12 NOTE — TOC Progression Note (Addendum)
Transition of Care [facility]) - Progression Note    Patient Details  Name: Carolyn [name] MRN: [id number] Date of Birth: 13-Apr-1955  [facility] CM/SW Contact  Leeroy Cha, RN Phone Number: 04/12/2022, 10:56 AM  Clinical Narrative:    Tct-peggy at [facility] to check on bed status called at [phone]. [phone] text from [email or web address] call report to [phone] Md notified Tct-husband notified of transfer at 1130. Expected Discharge Plan: [place] Barriers to Discharge: Continued Medical Work up  Expected Discharge Plan and Services   Discharge Planning Services: CM Consult Post Acute Care Choice: [place] arrangements for the past 2 months: Single Family Home                                       Social Determinants of Health (SDOH) Interventions SDOH Screenings   Food Insecurity: No Food Insecurity (03/15/2022)  Housing: Low Risk  (03/15/2022)  Transportation Needs: No Transportation Needs (03/15/2022)  Utilities: At Risk (03/15/2022)  Depression (PHQ2-9): Low Risk  (07/22/2020)  Tobacco Use: Low Risk  (04/06/2022)    Readmission Risk Interventions   No data to display

## 2022-04-12 NOTE — Progress Notes (Signed)
Patient discharged as ordered to [facility]. Report called to nurse Mikki Santee prior to D/C- all questions answered. Patient husband also notified at time of D/C. All belongings sent with patient. Dressings to right hand/bilateral feet C,D & I- changed at 1115. Pt D/C'd via [facility].

## 2022-04-12 NOTE — TOC Transition Note (Addendum)
Transition of Care [facility]) - CM/SW Discharge Note   Patient Details  Name: Carolyn [name] MRN: [id number] Date of Birth: 09/21/1955  [facility]) CM/SW Contact:  Leeroy Cha, RN Phone Number: 04/12/2022, 1:15 PM   Clinical Narrative:    [facility] called for transport at 1450. Completed packet is at the nurses station.   Final next level of care: Skilled Nursing Facility Barriers to Discharge: Barriers Resolved   Patient Goals and CMS Choice CMS Medicare.gov Compare Post Acute Care list provided to:: Patient Choice offered to / list presented to : Patient  Discharge Placement                         Discharge Plan and Services Additional resources added to the After Visit Summary for     Discharge Planning Services: CM Consult Post Acute Care Choice: [place]                               Social Determinants of Health (SDOH) Interventions SDOH Screenings   Food Insecurity: No Food Insecurity (03/15/2022)  Housing: Low Risk  (03/15/2022)  Transportation Needs: No Transportation Needs (03/15/2022)  Utilities: At Risk (03/15/2022)  Depression (PHQ2-9): Low Risk  (07/22/2020)  Tobacco Use: Low Risk  (04/06/2022)     Readmission Risk Interventions   No data to display

## 2022-04-12 NOTE — Discharge Summary (Addendum)
Physician Discharge Summary  Carolyn [name] LNL:[id number] DOB: 1955/08/06 DOA: 03/13/2022  PCP: Pcp, No  Admit date: 03/13/2022 Discharge date: 04/12/2022  Admitted From: Home Disposition: Skilled nursing facility   Recommendations for Outpatient Follow-up:  Follow up with PCP in 1-2 weeks Please obtain magnesium, phosphorus CMP/CBC in one week Please follow up with infectious disease Dr. Candiss Norse Follow-up with podiatry Dr. Jacqualyn Posey [facility] Follow-up with dermatology dunzweiter Follow-up with [facility] for cirrhosis and peptic ulcer disease Home Health: None Equipment/Devices:None Discharge Condition: Stable CODE STATUS: DNR Diet recommendation: Carb modified Brief/Interim Summary: 67 year old female, past medical history of chronic pancreatitis, history of prior alcoholism, gastroesophageal reflux, prior vitamin D deficiency documented. She has known malabsorption issues and has been on Creon. She presents with cellulitis and sepsis related to a ongoing rash of the hands and feet. It appears that the rash has been going on for several weeks continued to get worse. As started off with a small blister the blister then ruptured the skin started to dry out crack and flake. She is starting to flake and desquamate portions of the hands and feet. She developed increased pain and swelling in the right hand that developed redness that tracks into the forearm. She presents to the ER with an elevated white count and was treated with antibiotics for concern of sepsis.  Patient admitted to critical care team subsequently transferred to [facility] service on 01/14/2021 after stabilization noted to be in significant pain, palliative care GI consulted. -Patient noted to have been transfused 2 units packed red blood cells given trending hemoglobin. -Patient seen by GI for further evaluation and also by palliative care who have adjusted pain medications patient started on IV Decadron with improvement with  pain management. -Patient also noted to go into volume overload, placed on IV Lasix with good diuresis with adjustment in diuretics. -Patient will also with on calorie count which have improved TPN can be weaned down.   Discharge Diagnoses:  Principal Problem:   Severe sepsis ([place]) Active Problems:   Severe protein-calorie malnutrition (HCC)   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HCC)   Acute respiratory failure with hypoxia (HCC)   Chronic gastric ulcer with perforation (HCC)   Desquamative dermatitis   Sepsis ([place])   Cellulitis of left lower extremity   Hypophosphatemia   Anemia of chronic disease   Localized swelling of right upper extremity   NSVT (nonsustained ventricular tachycardia) (HCC)   History of gastric ulcer   Abnormal loss of weight   Aspiration pneumonia of both lungs (HCC)   Acute on chronic diastolic CHF (congestive heart failure) (HCC)   Influenza A   #1 severe sepsis secondary to disqamative dermatitis with superimposed cellulitis and possible underlying osteomyelitis -Patient noted to have presented with complicated nonhealing desquamative dermatitis involving hands and feet complicated by severe sepsis due to cellulitis/wound infection. -MRI of bilateral hands and feet done without any definite evidence of osteomyelitis. -Patient initially admitted to the critical care service, concern for probable vitamin/nutritional deficiencies versus paraneoplastic syndromes. -Concern for possible nutritional deficiencies, B2, B6, tryptophan, zinc, thiamine etc. could lead to this. -Patient noted to be prior alcoholic now with a chronic pancreatitis on Creon and likely has pre-existing condition of malabsorption as well as possible gastric surgery unsure as to what the patient has had this on not however per PCCM. -Patient placed empirically on IV vancomycin, IV Rocephin and subsequently transition to doxycycline and Augmentin completed a 10-day course of antibiotic treatment  03/22/2022.  -Patient noted to  go into acute hypoxic respiratory failure with concerns for possible aspiration pneumonia and volume overload and placed empirically on IV Unasyn to treat for total of 10 days. -Continue vitamin D3, vitamin B12, zinc, vitamin D3. -Continue vitamin A, selenium. -Will need follow-up with dermatology Kathryne Sharper, Linton Rump) -Will need outpatient follow-up with podiatry for bone biopsy to evaluate for osteomyelitis. -Outpatient follow-up with ID was set up for 03/22/2022 however will need to be rescheduled as patient is still hospitalized. -Patient unable to ambulate seen by PT would likely require SNF placement. Continue p.o. Dilaudid Continue fentanyl patch at 50 mcg/h -Patient also placed on Decadron 8 mg IV daily and wean down to 4 mg daily and recommended a long taper over several weeks per palliative care. 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 -Patient probably will benefit from [facility] follow-up once discharged from snf -SNF please inform Dr. Anderson Malta when she is discharged from SNF.   2.  Acute hypoxic respiratory failure likely secondary to acute on chronic diastolic CHF exacerbation +/- aspiration pneumonia/influenza A -Patient noted to have a bout of nausea and emesis the evening of 03/22/2022, noted to have increased O2 requirements and required 5 L nasal cannula from being on room air the day prior. -Chest x-ray done with new bilateral patchy airspace opacities possibly due to multifocal infection or pulmonary edema, small bilateral pleural effusions. Patient is able to maintain oxygen saturation on room air. -Respiratory viral panel positive for influenza A. -Finished a course of Tamiflu.   -Status post 10 days IV Unasyn. -Continue Mucinex as needed  3.  History of chronic alcoholic pancreatitis -Continue Creon.   4.  Hepatic cirrhosis with ascites/transaminitis -Being followed in the outpatient setting by [facility]. -BP  soft and as such diuretics held spironolactone and Lasix held early on during the hospitalization. -.CT Abdomen and Pelvis done and showed "Marked diffuse subcutaneous body wall edema. Mild ascites. The constellation of findings can be seen the setting of anasarca. Punctate free intraperitoneal gas within the upper abdomen adjacent to numerous surgical clips involving the distal stomach. A site of bowel perforation is not clearly identified and there is no leakage of oral contrast. Correlation for an exogenous source of gas, as can be seen with paracentesis, is recommended. Mild hepatic steatosis. Changes in keeping with chronic pancreatitis." -Outpatient follow-up with GI.   5.  Gastric chronic ulcer with contained perforation-she has received 3 units of packed RBCs during this hospital stay. 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   -Continue Protonix  6.  Hypothyroidism -TSH noted at 33.958 on 03/15/2022. -Continue Synthroid 50 mcg's daily.   -Will need repeat labs in 2-4 weeks in the outpatient setting.     6.  Hypophosphatemia/hypomagnesemia/hypokalemia-resolved  7.  Severe protein calorie malnutrition-patient received TPN initially and then was weaned off once she was able to taking p.o.  -Continue oral selenium, multivitamin, vitamin B6 100 mg daily, vitamin EE 400 units daily, vitamin A 10,000 units every other day.   8.  Anemia of chronic disease/folate deficiency.  She received 2 units of packed RBC. 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   -CT chest abdomen and pelvis done on  03/24/2022 per GI recommendations with multi focal  groundglass pulmonary infiltrates new from 03/15/2022 more prevalent within the lung apices bilaterally possibly related to atypical infection in the acute setting, large right and small left pleural effusions, marked diffuse subcutaneous body wall edema mild ascites can be seen with anasarca.  Punctuate free intraperitoneal gas within the upper abdomen adjacent to numerous surgical clips involving the distal stomach.  A site of bowel perforation is not clearly identified and no leakage of contrast noted.  Correlate with an exogenous source of gas as can be seen with paracentesis recommended.  Hepatic steatosis.  Changes of chronic pancreatitis.  No definite superimposed acute peripancreatic inflammatory changes noted. -Patient assessed by GI who reviewed CT scans, discussed patient's prior surgeries and feel at this time patient does not need any further endoscopic evaluation and to follow for now. -Hemoglobin noted to have stabilized at 10.0.   9.  Right upper extremity swelling and pain -Improved with upper extremity elevation and on diuretics.   -Upper extremity Dopplers negative for DVT.     10.  NSVT -Patient noted to have 7 beat run of NSVT on 03/19/2022, patient was asymptomatic.   Electrolytes normal   11.  Influenza A finished a course of Tamiflu -Noted on respiratory viral panel.    12 hyperglycemia in the setting of steroids increase Semglee to 15 units daily.  CBG (last 3)        Pressure Injury 01/07/22 Coccyx Mid Stage 1 -  Intact skin with non-blanchable redness of a localized area usually over a bony prominence. (Active)  01/07/22 1848  Location: Coccyx  Location Orientation: Mid  Staging: Stage 1 -  Intact skin with non-blanchable redness of a localized area usually over a bony prominence.  Wound Description (Comments):   Present on Admission: Yes  Dressing Type Foam - Lift dressing to assess site every shift 04/12/22 0939     Pressure Injury 03/15/22 Knee Anterior;Right  Unstageable - Full thickness tissue loss in which the base of the injury is covered by slough (yellow, tan, gray, green or brown) and/or eschar (tan, brown or black) in the wound bed. blister on knee now open  (Active)  03/15/22   Location: Knee  Location Orientation: Anterior;Right  Staging: Unstageable - Full thickness tissue loss in which the base of the injury is covered by slough (yellow, tan, gray, green or brown) and/or eschar (tan, brown or black) in the wound bed.  Wound Description (Comments): blister on knee now open with black center  Present on Admission: Yes  Dressing Type Foam - Lift dressing to assess site every shift 04/12/22 0939      Nutrition Problem: Severe Malnutrition Etiology: chronic illness (chronic pancreatitis, h/o multiple gastric surgeries including gastric bypass)    Signs/Symptoms: severe fat depletion, severe muscle depletion     Interventions: Ensure Enlive (each supplement provides 350kcal and 20 grams of protein), MVI, Tube feeding, Prostat  Estimated body mass index is 14.18 kg/m as calculated from the following:   Height as of this encounter: 5\' 8"  (1.727 m).   Weight as of this encounter: 42.3 kg.  Discharge Instructions  Discharge Instructions     Diet - low sodium heart healthy   Complete by: As directed    Discharge wound care:   Complete by: As directed    See orders   Increase activity slowly   Complete by: As directed       Allergies as of 04/12/2022  Reactions   Peanut-containing Drug Products Anaphylaxis   Tree nuts        Medication List     STOP taking these medications    buprenorphine 20 MCG/HR Ptwk Commonly known as: BUTRANS   clobetasol ointment 0.05 % Commonly known as: TEMOVATE   Hyoscyamine Sulfate SL 0.125 MG Subl Commonly known as: Levsin/SL   lidocaine 2 % solution Commonly known as: XYLOCAINE   oxyCODONE-acetaminophen 5-325 MG tablet Commonly known as: PERCOCET/ROXICET    triamcinolone cream 0.1 % Commonly known as: KENALOG       TAKE these medications    (feeding supplement) PROSource Plus liquid Take 30 mLs by mouth 2 (two) times daily between meals. What changed: You were already taking a medication with the same name, and this prescription was added. Make sure you understand how and when to take each.   feeding supplement Liqd Take 237 mLs by mouth 3 (three) times daily between meals. What changed: Another medication with the same name was added. Make sure you understand how and when to take each.   acetaminophen 325 MG tablet Commonly known as: TYLENOL Take 2 tablets (650 mg total) by mouth every 4 (four) hours as needed for mild pain (temp > 101.5).   dexamethasone 4 MG tablet Commonly known as: DECADRON Decadron 4 mg daily for 1 week Then 2 mg daily for 1 week Then 2 mg every other day for 1 week and then stop Start taking on: April 13, 2022   famotidine 10 MG tablet Commonly known as: PEPCID Take 10 mg by mouth 2 (two) times daily.   fentaNYL 50 MCG/HR Commonly known as: DURAGESIC Place 1 patch onto the skin every 3 (three) days. Start taking on: April 13, 2022   fluconazole 200 MG tablet Commonly known as: DIFLUCAN Take 1 tablet (200 mg total) by mouth daily. Start taking on: April 13, 2022   furosemide 40 MG tablet Commonly known as: LASIX TAKE 1 TABLET BY MOUTH EVERY DAY What changed:  when to take this reasons to take this   gabapentin 300 MG capsule Commonly known as: NEURONTIN Take 1 capsule (300 mg total) by mouth 2 (two) times daily.   HYDROmorphone 2 MG tablet Commonly known as: DILAUDID Take 1 tablet (2 mg total) by mouth every 4 (four) hours as needed for moderate pain (Break[facility] Pain).   insulin aspart 100 UNIT/ML injection Commonly known as: novoLOG Inject 3 Units into the skin 3 (three) times daily with meals.   insulin glargine-yfgn 100 UNIT/ML injection Commonly known as: SEMGLEE Inject  0.15 mLs (15 Units total) into the skin daily. Start taking on: April 13, 2022   levothyroxine 50 MCG tablet Commonly known as: SYNTHROID Take 1 tablet (50 mcg total) by mouth daily at 6 (six) AM. Start taking on: April 13, 2022   liothyronine 25 MCG tablet Commonly known as: CYTOMEL Take 1 tablet (25 mcg total) by mouth daily. Start taking on: April 13, 2022   lipase/protease/amylase 12000-38000 units Cpep capsule Commonly known as: Creon Take 2 capsules (24,000 Units total) by mouth 3 (three) times daily with meals.   loperamide 2 MG capsule Commonly known as: IMODIUM Take 1 capsule (2 mg total) by mouth every 6 (six) hours as needed for diarrhea or loose stools.   metoprolol tartrate 25 MG tablet Commonly known as: LOPRESSOR Take 1 tablet (25 mg total) by mouth 2 (two) times daily.   multivitamin with minerals Tabs tablet Take 1 tablet by mouth daily.  mupirocin cream 2 % Commonly known as: BACTROBAN Apply topically once for 1 dose.   ondansetron 4 MG tablet Commonly known as: ZOFRAN Take 4 mg by mouth every 8 (eight) hours as needed for nausea or vomiting.   pantoprazole 40 MG tablet Commonly known as: PROTONIX Take 1 tablet (40 mg total) by mouth 2 (two) times daily before a meal.   phenobarbital 16.2 MG tablet Commonly known as: LUMINAL Take 1 tablet (16.2 mg total) by mouth at bedtime.   polyethylene glycol 17 g packet Commonly known as: MIRALAX / GLYCOLAX Take 17 g by mouth 2 (two) times daily as needed for mild constipation.   potassium chloride SA 20 MEQ tablet Commonly known as: KLOR-CON M Take 1 tablet (20 mEq total) by mouth daily for 2 days. What changed:  when to take this Another medication with the same name was removed. Continue taking this medication, and follow the directions you see here.   pyridoxine 100 MG tablet Commonly known as: B-6 Take 1 tablet (100 mg total) by mouth daily. Start taking on: April 13, 2022    saccharomyces boulardii 250 MG capsule Commonly known as: FLORASTOR Take 1 capsule (250 mg total) by mouth 2 (two) times daily.   Selenium 100 MCG Tabs Take 1 tablet (100 mcg total) by mouth daily. Start taking on: April 13, 2022   silver sulfADIAZINE 1 % cream Commonly known as: SILVADENE Apply topically daily. Start taking on: April 13, 2022 What changed: when to take this   tiZANidine 2 MG tablet Commonly known as: ZANAFLEX Take 1 tablet (2 mg total) by mouth every 12 (twelve) hours as needed for muscle spasms.   vitamin A 3 MG (10000 UNITS) capsule Take 1 capsule (10,000 Units total) by mouth every other day. Start taking on: April 13, 2022   vitamin B-12 100 MCG tablet Commonly known as: CYANOCOBALAMIN Take 100 mcg by mouth daily.   vitamin D3 25 MCG tablet Commonly known as: CHOLECALCIFEROL Take 1 tablet (1,000 Units total) by mouth daily. Start taking on: April 13, 2022   vitamin E 180 MG (400 UNITS) capsule Take 1 capsule (400 Units total) by mouth daily. Start taking on: April 13, 2022   zinc sulfate 220 (50 Zn) MG capsule Take 1 capsule (220 mg total) by mouth daily.               Discharge Care Instructions  (From admission, onward)           Start     Ordered   04/12/22 0000  Discharge wound care:       Comments: See orders   04/12/22 1159            Contact information for follow-up providers     Danelle Earthly, MD Follow up.   Specialty: Infectious Diseases Contact information: [address] [place] [address] [phone]         Dunzweiler, Arline Asp, NP Follow up.   Specialty: Adult Health Nurse Practitioner Contact information: [address] [place] [address] [phone]         Vivi Barrack, DPM Follow up.   Specialty: Podiatry Contact information: [address] [place] [address] [phone]              Contact information for  after-discharge care     Destination     [facility] .   Service: Skilled Paramedic information: [address] [place] [id number] [phone]                    Allergies  Allergen Reactions   Peanut-Containing Drug Products Anaphylaxis    Tree nuts    Consultations: GI PCCM Infectious disease Interventional radiology   Procedures/Studies: DG Chest 1 View  Result Date: 04/11/2022 CLINICAL DATA:  Shortness of breath. Cough. Currently being treated for cellulitis and sepsis related to an ongoing rash of the hands and feet. EXAM: CHEST  1 VIEW COMPARISON:  03/31/2022 FINDINGS: The right lateral costophrenic angle is not included. Normal sized heart. Clear lungs. No residual pleural fluid visualized. Old, healed left rib fractures. Left PICC tip in the region of the superior cavoatrial junction. IMPRESSION: No acute abnormality. Resolved bilateral pleural effusions in the portions of the chest included. Electronically Signed   By: Beckie SaltsSteven  Reid M.D.   On: 04/11/2022 13:09   CT CELIAC PLEXUS BLOCK ANESTHETIC  Result Date: 04/06/2022 INDICATION: Chronic pancreatitis.  Intractable abdominal pain. EXAM: CT-GUIDED CELIAC PLEXUS NERVE BLOCK, DIAGNOSTIC AND THERAPEUTIC COMPARISON:  None Available. MEDICATIONS: 80 mg Kenalog and 30 mL 0.5% bupivacaine. ANESTHESIA/SEDATION: Moderate (conscious) sedation was employed during this procedure. A total of Versed 2 mg and Fentanyl 100 mcg was administered intravenously. Moderate Sedation Time: 37 minutes. The patient's level of consciousness and vital signs were monitored continuously by radiology nursing throughout the procedure under my direct supervision. CONTRAST:  1 mL Omnipaque 300 FLUOROSCOPY TIME:  CT dose in mGy was not provided. COMPLICATIONS: None immediate. PROCEDURE: RADIATION DOSE REDUCTION: This exam was performed according to the departmental dose-optimization program which includes  automated exposure control, adjustment of the mA and/or kV according to patient size and/or use of iterative reconstruction technique. Informed consent was obtained from the patient following an explanation of the procedure, risks, benefits and alternatives. A time out was performed prior to the initiation of the procedure. The patient was positioned prone on the CT table and a limited CT was performed for procedural planning demonstrating an adequate window at the upper abdomen. The procedure was planned. A timeout was performed prior to the initiation of the procedure. The operative site was prepped and draped in the usual sterile fashion. Appropriate trajectory was confirmed with a 22 gauge spinal needle after the adjacent tissues were anesthetized with 1% Lidocaine with epinephrine. Under intermittent CT guidance, 15 cm 21-gauge Chiba needles were inserted via a posterior approach and the needle tips were positioned in the retroperitoneal space immediately anterolateral to the aorta, at the region of the celiac trunk. A small amount of dilute contrast was injected through the needles to confirm position. A solution containing 2 mL of 40 mg/mL Kenalog and 15 mL of Bupivacaine was mixed and injected through each needle. Intermittent CT scanning confirmed appropriate spread into the extra vascular spaces. The needles were removed and hemostasis was achieved with manual compression. A limited postprocedural CT was negative for hemorrhage or additional complication. A dressing was placed. The patient tolerated the procedure well without immediate postprocedural complication. IMPRESSION: Successful CT-guided diagnostic and therapeutic celiac plexus nerve block, as above. PLAN: The patient will be re-evaluated in the postprocedural setting and again in 3-6 months to assess the efficacy of this nerve block. If successful, the patient may be a candidate for future celiac plexus nerve blocks versus neurolysis. Roanna BanningJon  Mugweru, MD [facility] Electronically Signed   By: Roanna BanningJon  Mugweru M.D.   On: 04/06/2022 17:44   DG CHEST  PORT 1 VIEW  Result Date: 03/31/2022 CLINICAL DATA:  Shortness of breath EXAM: PORTABLE CHEST 1 VIEW COMPARISON:  03/30/2022 FINDINGS: Left arm PICC line tip is in the superior cavoatrial junction. Stable cardiomediastinal contours. Decreased volume of left pleural effusion. Posterior layering right pleural effusion is unchanged in the interval. Remote left posterior rib fractures, unchanged. IMPRESSION: 1. Decreased volume of left pleural effusion. 2. Unchanged posterior layering right pleural effusion. Electronically Signed   By: Kerby Moors M.D.   On: 03/31/2022 08:13   DG CHEST PORT 1 VIEW  Result Date: 03/30/2022 CLINICAL DATA:  Shortness of breath. EXAM: PORTABLE CHEST 1 VIEW COMPARISON:  03/28/2022 FINDINGS: Left arm PICC line tip is at the superior cavoatrial junction. Stable cardiomediastinal contours. Persistent bilateral pleural effusions, right greater than left. Mild increase interstitial markings concerning for pulmonary edema. No pneumothorax. No acute osseous findings. IMPRESSION: 1. Persistent bilateral pleural effusions, right greater than left. 2. Suspect mild pulmonary edema. Electronically Signed   By: Kerby Moors M.D.   On: 03/30/2022 06:46   DG CHEST PORT 1 VIEW  Result Date: 03/28/2022 CLINICAL DATA:  Short of breath, dyspnea EXAM: PORTABLE CHEST 1 VIEW COMPARISON:  Prior chest x-ray 03/26/2022 FINDINGS: Stable cardiomegaly. Moderately large layering right pleural effusion. Small left pleural effusion. Associated bibasilar airspace opacities. No pneumothorax. No acute osseous abnormality. IMPRESSION: 1. Right larger than left layering pleural effusions with associated bibasilar atelectasis versus infiltrate. 2. Stable cardiomegaly. 3. No significant interval change in the appearance of the lungs. Electronically  Signed   By: Jacqulynn Cadet M.D.   On: 03/28/2022 07:38   DG CHEST PORT 1 VIEW  Result Date: 03/26/2022 CLINICAL DATA:  Shortness of breath. EXAM: PORTABLE CHEST 1 VIEW COMPARISON:  Chest x-ray from abdominal series on 03/23/2022 FINDINGS: Stable heart size. Continued probable underlying moderate congestive heart failure with increased prominence of a right pleural effusion and stable smaller left pleural effusion. No pneumothorax. IMPRESSION: Continued probable moderate congestive heart failure with increased prominence of a right pleural effusion. Electronically Signed   By: Aletta Edouard M.D.   On: 03/26/2022 07:52   CT CHEST ABDOMEN PELVIS W CONTRAST  Result Date: 03/24/2022 CLINICAL DATA:  Weight loss, unintended EXAM: CT CHEST, ABDOMEN, AND PELVIS WITH CONTRAST TECHNIQUE: Multidetector CT imaging of the chest, abdomen and pelvis was performed following the standard protocol during bolus administration of intravenous contrast. RADIATION DOSE REDUCTION: This exam was performed according to the departmental dose-optimization program which includes automated exposure control, adjustment of the mA and/or kV according to patient size and/or use of iterative reconstruction technique. CONTRAST:  18mL OMNIPAQUE IOHEXOL 300 MG/ML  SOLN COMPARISON:  CT abdomen pelvis 11/03/2012 FINDINGS: CT CHEST FINDINGS Cardiovascular: No significant coronary artery calcification. Global cardiac size within normal limits. No pericardial effusion. Central pulmonary arteries are of normal caliber. Mild atherosclerotic calcification within the thoracic aorta. No aortic aneurysm. Left upper extremity PICC line tip noted at the superior cavoatrial junction. Mediastinum/Nodes: There is diffuse mild infiltration of the mediastinal fat in keeping with edema. No loculated fluid collections are identified, however. Visualized thyroid is unremarkable. No pathologic thoracic adenopathy. Esophagus unremarkable. Lungs/Pleura:  Multifocal ground-glass pulmonary infiltrates are present, new from chest radiograph of 03/15/2022, more prevalent within the lung apices bilaterally, possibly related to atypical infection in the acute setting. Large right and small left pleural effusions have developed. Subtotal collapse of the right lower lobe. No pneumothorax. No central obstructing lesion. Musculoskeletal: Healed bilateral rib fractures are noted. No acute bone abnormality.  No lytic or blastic bone lesion. CT ABDOMEN PELVIS FINDINGS Hepatobiliary: Mild hepatic steatosis. No enhancing intrahepatic mass. No intra or extrahepatic biliary ductal dilation. Status post cholecystectomy. Pancreas: The pancreas is markedly atrophic and there are scattered parenchymal calcifications seen throughout the residual pancreas in keeping with changes of chronic pancreatitis. No definite superimposed acute peripancreatic inflammatory changes. No loculated peripancreatic fluid collections are seen. Spleen: Normal in size without focal abnormality. Adrenals/Urinary Tract: Adrenal glands are unremarkable. Kidneys are normal, without renal calculi, focal lesion, or hydronephrosis. Bladder is unremarkable. Stomach/Bowel: Numerous surgical clips are seen adjacent to the gastric antrum. Small bowel anastomotic staple lines are noted within the upper quadrants bilaterally. Adjacent to this region, within the subhepatic space is a small amount of extraluminal gas, best seen on axial image # 63/3 and coronal image # 30/6. The source is not clearly identified and there is no extraluminal extension of oral contrast seen. The stomach, small bowel, and large bowel are otherwise unremarkable. No evidence of obstruction or focal inflammation. The appendix is not visualized and is likely absent. Mild ascites is present. Vascular/Lymphatic: Mild atherosclerotic calcification within the supra celiac aorta. The abdominal vasculature is otherwise unremarkable. No pathologic  adenopathy within the abdomen and pelvis. Reproductive: Uterus and bilateral adnexa are unremarkable. Other: There is diffuse severe subcutaneous body wall edema noted. No abdominal wall hernia. Musculoskeletal: No acute bone abnormality. No lytic or blastic bone lesion. IMPRESSION: 1. Multifocal ground-glass pulmonary infiltrates, new from chest radiograph of 03/15/2022, more prevalent within the lung apices bilaterally, possibly related to atypical infection in the acute setting. 2. Large right and small left pleural effusions. Marked diffuse subcutaneous body wall edema. Mild ascites. The constellation of findings can be seen the setting of anasarca. 3. Punctate free intraperitoneal gas within the upper abdomen adjacent to numerous surgical clips involving the distal stomach. A site of bowel perforation is not clearly identified and there is no leakage of oral contrast. Correlation for an exogenous source of gas, as can be seen with paracentesis, is recommended. 4. Mild hepatic steatosis. 5. Changes in keeping with chronic pancreatitis. No definite superimposed acute peripancreatic inflammatory changes. Aortic Atherosclerosis (ICD10-I70.0). Electronically Signed   By: Helyn Numbers M.D.   On: 03/24/2022 23:35   DG ABD ACUTE 2+V W 1V CHEST  Result Date: 03/23/2022 CLINICAL DATA:  Hypoxia EXAM: DG ABDOMEN ACUTE WITH 1 VIEW CHEST COMPARISON:  Chest x-ray dated March 15, 2022 FINDINGS: Nonobstructive bowel-gas pattern. Hernia mesh of the upper abdomen. Cardiac and mediastinal contours are within normal limits. New bilateral patchy airspace opacities. New small bilateral pleural effusions. IMPRESSION: 1. New bilateral patchy airspace opacities, possibly due to multifocal infection or pulmonary edema. 2. Small bilateral pleural effusions. 3. Nonobstructive bowel-gas pattern. Electronically Signed   By: Allegra Lai M.D.   On: 03/23/2022 09:27   VAS [place] UPPER EXTREMITY VENOUS DUPLEX  Result Date:  03/20/2022 UPPER VENOUS STUDY  Patient Name:  Carolyn [name]  Date of Exam:   03/19/2022 Medical Rec #: [id number]       Accession #:    [id number] Date of Birth: 08-03-1955       Patient Gender: F Patient Age:   89 years Exam Location:  [facility] Procedure:      VAS [place] UPPER EXTREMITY VENOUS DUPLEX Referring Phys: Ramiro Harvest --------------------------------------------------------------------------------  Indications: Swelling Risk Factors: None identified. Limitations: Poor ultrasound/tissue interface, bandages and open wound. Comparison Study: No prior studies. Performing Technologist: Chanda Busing RVT  Examination Guidelines: A  complete evaluation includes B-mode imaging, spectral Doppler, color Doppler, and power Doppler as needed of all accessible portions of each vessel. Bilateral testing is considered an integral part of a complete examination. Limited examinations for reoccurring indications may be performed as noted.  Right Findings: +----------+------------+---------+-----------+----------+-------+ RIGHT     CompressiblePhasicitySpontaneousPropertiesSummary +----------+------------+---------+-----------+----------+-------+ IJV           Full       Yes       Yes                      +----------+------------+---------+-----------+----------+-------+ Subclavian    Full       Yes       Yes                      +----------+------------+---------+-----------+----------+-------+ Axillary      Full       Yes       Yes                      +----------+------------+---------+-----------+----------+-------+ Brachial      Full       Yes       Yes                      +----------+------------+---------+-----------+----------+-------+ Radial        Full                                          +----------+------------+---------+-----------+----------+-------+ Ulnar         Full                                           +----------+------------+---------+-----------+----------+-------+ Cephalic      Full                                          +----------+------------+---------+-----------+----------+-------+ Basilic       Full                                          +----------+------------+---------+-----------+----------+-------+  Left Findings: +----------+------------+---------+-----------+----------+-------+ LEFT      CompressiblePhasicitySpontaneousPropertiesSummary +----------+------------+---------+-----------+----------+-------+ Subclavian    Full       Yes       Yes                      +----------+------------+---------+-----------+----------+-------+  Summary:  Right: No evidence of deep vein thrombosis in the upper extremity. No evidence of superficial vein thrombosis in the upper extremity.  Left: No evidence of thrombosis in the subclavian.  *See table(s) above for measurements and observations.  Diagnosing physician: Gerarda Fraction Electronically signed by Gerarda Fraction on 03/20/2022 at 1:13:44 PM.    Final    [place] EKG SITE RITE  Result Date: 03/16/2022 If Site Rite image not attached, placement could not be confirmed due to current cardiac rhythm.  DG Chest Port 1 View  Result Date: 03/15/2022 CLINICAL DATA:  Sepsis. EXAM: PORTABLE CHEST 1 VIEW COMPARISON:  January 17, 2022 FINDINGS: The heart size and  mediastinal contours are within normal limits. Low lung volumes are noted. Both lungs are clear. Chronic sixth and seventh left rib fractures are seen. A chronic ninth rib deformity is also noted. IMPRESSION: Low lung volumes without evidence of acute cardiopulmonary disease. Electronically Signed   By: Aram Candela M.D.   On: 03/15/2022 04:01   MR FOOT RIGHT WO CONTRAST  Result Date: 03/13/2022 CLINICAL DATA:  Diabetes. Osteomyelitis suspected. Foot swelling. Soft tissue infection suspected. EXAM: MRI OF THE RIGHT FOREFOOT WITHOUT CONTRAST TECHNIQUE: Multiplanar,  multisequence MR imaging of the right foot radiographs 03/13/2022 was performed. No intravenous contrast was administered. COMPARISON:  Right foot radiographs 03/13/2022, 02/28/2022, 01/06/2022; MRI right forefoot FINDINGS: Bones/Joint/Cartilage There is again decreased T1 increased T2 signal marrow edema within the distal aspect of the distal phalanx of the great toe. Again no definitive soft tissue ulceration is seen, and recommend clinical correlation for signs of adjacent soft tissue infection. Inhomogeneous fat saturation limits evaluation of the phalanges. There is again possible decreased T1 and increased T2/STIR signal within the middle and distal phalanges of the fifth toe, however this may be artifactual. Mild apparent marrow edema within the distal phalanx of the second toe may be slightly increased from prior (sagittal STIR series 9, image 16). Question thinning of the overlying distal second toe skin. Moderate to severe great toe interphalangeal joint space narrowing and peripheral osteophytosis with unchanged valgus angulation. Ligaments The Lisfranc ligament complex is intact. The collateral ligaments appear intact. Muscles and Tendons Redemonstration of diffuse plantar and dorsal muscle edema, not significantly changed. Soft tissues No definite soft tissue ulceration is seen. There is again mild forefoot plantar and dorsal subcutaneous fat edema and swelling, greatest within the mid to distal aspect of the metatarsals and similar to 01/07/2022 prior MRI. IMPRESSION: Compared to 01/07/2022: 1. Marrow edema within the distal aspect of the distal phalanx of the great toe appears unchanged. Again no definitive soft tissue ulceration is seen, and recommend clinical correlation for signs of adjacent soft tissue infection that would raise concern for this edema representing osteomyelitis versus stress related change. 2. Apparent mildly increased marrow edema within the distal phalanx of the second toe  compared to 01/07/2022. Question thinning of the overlying distal second toe skin. Recommend clinical correlation for concern for soft tissue ulcer/infection in this region which would raise concern for distal phalanx osteomyelitis. 3. There is again possible decreased T1 and increased T2 signal within the middle and distal phalanges of the fifth toe, however again this may be artifactual. Electronically Signed   By: Neita Garnet M.D.   On: 03/13/2022 19:52   MR FOOT LEFT WO CONTRAST  Result Date: 03/13/2022 CLINICAL DATA:  Diabetes. Foot swelling. Osteomyelitis suspected. Soft tissue infection suspected. EXAM: MRI OF THE LEFT FOOT WITHOUT CONTRAST TECHNIQUE: Multiplanar, multisequence MR imaging of the left forefoot was performed. No intravenous contrast was administered. COMPARISON:  Left foot radiographs 03/13/2022, 02/28/2022, 01/07/2022, 03/02/2021 FINDINGS: Bones/Joint/Cartilage Note is made that an acute, intra-articular, mildly displaced fracture was seen on prior 03/02/2021 radiographs within the medial base of the proximal phalanx of the great toe. This appears to have at least partially healed with unchanged mild displacement on numerous prior radiographs including radiographs today, 03/13/2022. Nevertheless, there is high-grade marrow edema seen throughout the proximal 80% of the great toe, and there appear to be multiple acute to subacute fracture lines. There is additional marrow edema and apparent cortical step-off/possible acute to subacute fracture within the distal 50% of the great toe  distal phalanx (sagittal series 8, image 11 and coronal series 7, image 10). Recommend clinical correlation for whether there is any concern for infection within the distal great toe that would be suspicious for great toe distal phalanx osteomyelitis. There is also marrow edema throughout the second toe distal phalanx (sagittal series 8, image 17) and the distal tip of the distal phalanx of the third toe  (sagittal series 8, image 21). Recommend additional clinical correlation for any concern for skin ulceration and infection in these regions. No definitive soft tissue ulcer is seen on MRI. There is moderate marrow edema within the distal aspect of the cuboid and mild patchy marrow edema throughout the proximal, mid, and distal aspect of the fifth metatarsal. This may be secondary to degenerative changes and/or stress related changes. Severe tarsometatarsal cartilage degenerative changes with full-thickness cartilage loss diffusely, greatest within the fourth and fifth tarsometatarsal joints at the articulation with the cuboid. Ligaments The Lisfranc ligament complex is intact. The plantar plates appear intact. Muscles and Tendons Minimal flexor tenosynovitis diffusely at the level of the metatarsal shafts. Mild edema is seen throughout the plantar and dorsal musculature, nonspecific myositis. Soft tissues There is mild edema and soft tissue swelling greatest within the dorsal medial aspect of the forefoot, at the level of the great toe metatarsal through phalanges, and within the plantar midfoot. No definite soft tissue ulcer is seen. No abscess or other fluid collection is seen. IMPRESSION: 1. There was an acute fracture within the medial base of the proximal phalanx of the great toe on 03/02/2021 radiographs that appears to have healed over time on subsequent radiographs, however on the current MRI there is high-grade marrow edema throughout the great toe proximal phalanx with apparent subacute fracture lines. Question incomplete healing versus repeat trauma. 2. Additional marrow edema and apparent cortical step-off/possible acute to subacute fracture within the distal 50% of the great toe distal phalanx. Recommend clinical correlation for whether there is any concern for infection within the distal great toe that would be suspicious for great toe distal phalanx osteomyelitis. 3. There is also marrow edema  throughout the second toe distal phalanx and the distal tip of the distal phalanx of the third toe. Recommend clinical correlation for whether there are any toe soft tissue ulcerations that would raise concern for acute osteomyelitis. Electronically Signed   By: Neita Garnetonald  Viola M.D.   On: 03/13/2022 19:24   (Echo, Carotid, EGD, Colonoscopy, ERCP)    Subjective:  Resting in bed no complaints no events overnight Discharge Exam: Vitals:   04/12/22 0445 04/12/22 1124  BP: 133/81 134/78  Pulse: 69 92  Resp: 16 18  Temp: 98.5 F (36.9 C) 97.9 F (36.6 C)  SpO2: 98% 98%   Vitals:   04/11/22 2024 04/12/22 0445 04/12/22 0447 04/12/22 1124  BP: 127/69 133/81  134/78  Pulse: 88 69  92  Resp: 20 16  18   Temp: 98.5 F (36.9 C) 98.5 F (36.9 C)  97.9 F (36.6 C)  TempSrc: Oral Oral  Oral  SpO2: 100% 98%  98%  Weight:   42.3 kg   Height:        General: Pt is alert, awake, not in acute distress Cardiovascular: RRR, S1/S2 +, no rubs, no gallops Respiratory: CTA bilaterally, no wheezing, no rhonchi Abdominal: Soft, NT, ND, bowel sounds + Extremities: Covered with dressings   The results of significant diagnostics from this hospitalization (including imaging, microbiology, ancillary and laboratory) are listed below for reference.  Microbiology: Recent Results (from the past 240 hour(s))  Respiratory (~20 pathogens) panel by PCR     Status: Abnormal   Collection Time: 04/05/22 11:26 AM   Specimen: Nasopharyngeal Swab; Respiratory  Result Value Ref Range Status   Adenovirus NOT DETECTED NOT DETECTED Final   Coronavirus 229E NOT DETECTED NOT DETECTED Final    Comment: (NOTE) The Coronavirus on the Respiratory Panel, DOES NOT test for the novel  Coronavirus (2019 nCoV)    Coronavirus HKU1 NOT DETECTED NOT DETECTED Final   Coronavirus NL63 NOT DETECTED NOT DETECTED Final   Coronavirus OC43 NOT DETECTED NOT DETECTED Final   Metapneumovirus NOT DETECTED NOT DETECTED Final    Rhinovirus / Enterovirus NOT DETECTED NOT DETECTED Final   Influenza A H1 2009 DETECTED (A) NOT DETECTED Final   Influenza B NOT DETECTED NOT DETECTED Final   Parainfluenza Virus 1 NOT DETECTED NOT DETECTED Final   Parainfluenza Virus 2 NOT DETECTED NOT DETECTED Final   Parainfluenza Virus 3 NOT DETECTED NOT DETECTED Final   Parainfluenza Virus 4 NOT DETECTED NOT DETECTED Final   Respiratory Syncytial Virus NOT DETECTED NOT DETECTED Final   Bordetella pertussis NOT DETECTED NOT DETECTED Final   Bordetella Parapertussis NOT DETECTED NOT DETECTED Final   Chlamydophila pneumoniae NOT DETECTED NOT DETECTED Final   Mycoplasma pneumoniae NOT DETECTED NOT DETECTED Final    Comment: Performed at [facility], [address], [place], [place] [address]  Group A Strep by PCR     Status: None   Collection Time: 04/05/22 11:27 AM   Specimen: Throat; Sterile Swab  Result Value Ref Range Status   Group A Strep by PCR NOT DETECTED NOT DETECTED Final    Comment: Performed at [facility], [address] [place], [place] [address]     Labs: BNP (last 3 results) Recent Labs    01/11/22 0840 03/23/22 0835  BNP 256.2* 3,564.6*   Basic Metabolic Panel: Recent Labs  Lab 04/06/22 0338 04/07/22 0242 04/10/22 1025 04/12/22 0304  NA 134* 133* 131* 130*  K 4.0 3.6 3.9 3.9  CL 94* 93* 88* 92*  CO2 27 26 28 28   GLUCOSE 70 179* 136* 109*  BUN 71* 84* 86* 51*  CREATININE 0.72 0.72 0.72 0.55  CALCIUM 9.7 10.1 10.2 9.5  MG 2.1 2.4 2.1  --   PHOS 4.6 5.9* 3.6  --    Liver Function Tests: Recent Labs  Lab 04/06/22 0338 04/07/22 0242 04/10/22 1025  AST  --  32 47*  ALT  --  31 46*  ALKPHOS  --  98 88  BILITOT  --  0.7 0.7  PROT  --  8.2* 8.4*  ALBUMIN 4.6 5.4* 5.3*   No results for input(s): "LIPASE", "AMYLASE" in the last 168 hours. No results for input(s): "AMMONIA" in the last 168 hours. CBC: Recent Labs  Lab 04/06/22 0338 04/07/22 0242 04/10/22 1025  WBC  11.4* 9.7 13.0*  NEUTROABS  --  8.0*  --   HGB 10.0* 10.2* 12.0  HCT 31.3* 31.6* 36.1  MCV 94.6 94.0 92.1  PLT 234 258 262   Cardiac Enzymes: No results for input(s): "CKTOTAL", "CKMB", "CKMBINDEX", "TROPONINI" in the last 168 hours. BNP: Invalid input(s): "POCBNP" CBG: Recent Labs  Lab 04/11/22 1227 04/11/22 1636 04/11/22 2026 04/12/22 0741 04/12/22 1122  GLUCAP 266* 227* 144* 124* 267*   D-Dimer No results for input(s): "DDIMER" in the last 72 hours. Hgb A1c No results for input(s): "HGBA1C" in the  last 72 hours. Lipid Profile No results for input(s): "CHOL", "HDL", "LDLCALC", "TRIG", "CHOLHDL", "LDLDIRECT" in the last 72 hours. Thyroid function studies No results for input(s): "TSH", "T4TOTAL", "T3FREE", "THYROIDAB" in the last 72 hours.  Invalid input(s): "FREET3" Anemia work up No results for input(s): "VITAMINB12", "FOLATE", "FERRITIN", "TIBC", "IRON", "RETICCTPCT" in the last 72 hours. Urinalysis    Component Value Date/Time   COLORURINE YELLOW 09/30/2017 1449   APPEARANCEUR CLEAR 09/30/2017 1449   LABSPEC <1.005 (L) 09/30/2017 1449   PHURINE 6.5 09/30/2017 1449   GLUCOSEU NEGATIVE 09/30/2017 1449   HGBUR NEGATIVE 09/30/2017 1449   BILIRUBINUR NEGATIVE 09/30/2017 1449   KETONESUR NEGATIVE 09/30/2017 1449   PROTEINUR NEGATIVE 09/30/2017 1449   UROBILINOGEN 4.0 (H) 06/07/2011 0007   NITRITE NEGATIVE 09/30/2017 1449   LEUKOCYTESUR TRACE (A) 09/30/2017 1449   Sepsis Labs Recent Labs  Lab 04/06/22 0338 04/07/22 0242 04/10/22 1025  WBC 11.4* 9.7 13.0*   Microbiology Recent Results (from the past 240 hour(s))  Respiratory (~20 pathogens) panel by PCR     Status: Abnormal   Collection Time: 04/05/22 11:26 AM   Specimen: Nasopharyngeal Swab; Respiratory  Result Value Ref Range Status   Adenovirus NOT DETECTED NOT DETECTED Final   Coronavirus 229E NOT DETECTED NOT DETECTED Final    Comment: (NOTE) The Coronavirus on the Respiratory Panel, DOES NOT test  for the novel  Coronavirus (2019 nCoV)    Coronavirus HKU1 NOT DETECTED NOT DETECTED Final   Coronavirus NL63 NOT DETECTED NOT DETECTED Final   Coronavirus OC43 NOT DETECTED NOT DETECTED Final   Metapneumovirus NOT DETECTED NOT DETECTED Final   Rhinovirus / Enterovirus NOT DETECTED NOT DETECTED Final   Influenza A H1 2009 DETECTED (A) NOT DETECTED Final   Influenza B NOT DETECTED NOT DETECTED Final   Parainfluenza Virus 1 NOT DETECTED NOT DETECTED Final   Parainfluenza Virus 2 NOT DETECTED NOT DETECTED Final   Parainfluenza Virus 3 NOT DETECTED NOT DETECTED Final   Parainfluenza Virus 4 NOT DETECTED NOT DETECTED Final   Respiratory Syncytial Virus NOT DETECTED NOT DETECTED Final   Bordetella pertussis NOT DETECTED NOT DETECTED Final   Bordetella Parapertussis NOT DETECTED NOT DETECTED Final   Chlamydophila pneumoniae NOT DETECTED NOT DETECTED Final   Mycoplasma pneumoniae NOT DETECTED NOT DETECTED Final    Comment: Performed at [facility], [address], [place], [place] [address]  Group A Strep by PCR     Status: None   Collection Time: 04/05/22 11:27 AM   Specimen: Throat; Sterile Swab  Result Value Ref Range Status   Group A Strep by PCR NOT DETECTED NOT DETECTED Final    Comment: Performed at [facility], [address], [place], [place] [address]     Time coordinating discharge: 49 minutes  SIGNED: Alwyn Ren, MD  [facility] 04/12/2022, 1:02 PM

## 2022-04-20 ENCOUNTER — Emergency Department (HOSPITAL_COMMUNITY): Payer: Medicare PPO

## 2022-04-20 ENCOUNTER — Inpatient Hospital Stay (HOSPITAL_COMMUNITY)
Admission: EM | Admit: 2022-04-20 | Discharge: 2022-04-23 | DRG: 492 | Disposition: A | Discharge status: Skilled Nursing Facility | Payer: Medicare PPO | Source: Skilled Nursing Facility | Attending: Internal Medicine | Admitting: Internal Medicine

## 2022-04-20 DIAGNOSIS — D638 Anemia in other chronic diseases classified elsewhere: Secondary | ICD-10-CM | POA: Diagnosis present

## 2022-04-20 DIAGNOSIS — S82832A Other fracture of upper and lower end of left fibula, initial encounter for closed fracture: Secondary | ICD-10-CM | POA: Diagnosis present

## 2022-04-20 DIAGNOSIS — W1830XA Fall on same level, unspecified, initial encounter: Secondary | ICD-10-CM | POA: Diagnosis present

## 2022-04-20 DIAGNOSIS — K7030 Alcoholic cirrhosis of liver without ascites: Secondary | ICD-10-CM | POA: Diagnosis present

## 2022-04-20 DIAGNOSIS — D72829 Elevated white blood cell count, unspecified: Secondary | ICD-10-CM | POA: Diagnosis present

## 2022-04-20 DIAGNOSIS — Z79899 Other long term (current) drug therapy: Secondary | ICD-10-CM

## 2022-04-20 DIAGNOSIS — K219 Gastro-esophageal reflux disease without esophagitis: Secondary | ICD-10-CM | POA: Diagnosis present

## 2022-04-20 DIAGNOSIS — I5032 Chronic diastolic (congestive) heart failure: Secondary | ICD-10-CM | POA: Diagnosis present

## 2022-04-20 DIAGNOSIS — K766 Portal hypertension: Secondary | ICD-10-CM | POA: Diagnosis present

## 2022-04-20 DIAGNOSIS — K3189 Other diseases of stomach and duodenum: Secondary | ICD-10-CM | POA: Diagnosis present

## 2022-04-20 DIAGNOSIS — Z66 Do not resuscitate: Secondary | ICD-10-CM | POA: Diagnosis present

## 2022-04-20 DIAGNOSIS — F1021 Alcohol dependence, in remission: Secondary | ICD-10-CM | POA: Diagnosis present

## 2022-04-20 DIAGNOSIS — Z794 Long term (current) use of insulin: Secondary | ICD-10-CM

## 2022-04-20 DIAGNOSIS — M6259 Muscle wasting and atrophy, not elsewhere classified, multiple sites: Secondary | ICD-10-CM | POA: Diagnosis present

## 2022-04-20 DIAGNOSIS — K860 Alcohol-induced chronic pancreatitis: Secondary | ICD-10-CM | POA: Diagnosis present

## 2022-04-20 DIAGNOSIS — E43 Unspecified severe protein-calorie malnutrition: Secondary | ICD-10-CM | POA: Diagnosis present

## 2022-04-20 DIAGNOSIS — Z681 Body mass index (BMI) 19 or less, adult: Secondary | ICD-10-CM

## 2022-04-20 DIAGNOSIS — Z79891 Long term (current) use of opiate analgesic: Secondary | ICD-10-CM

## 2022-04-20 DIAGNOSIS — Y92128 Other place in nursing home as the place of occurrence of the external cause: Secondary | ICD-10-CM

## 2022-04-20 DIAGNOSIS — G894 Chronic pain syndrome: Secondary | ICD-10-CM | POA: Diagnosis present

## 2022-04-20 DIAGNOSIS — Z91010 Allergy to peanuts: Secondary | ICD-10-CM

## 2022-04-20 DIAGNOSIS — Z9884 Bariatric surgery status: Secondary | ICD-10-CM

## 2022-04-20 DIAGNOSIS — M810 Age-related osteoporosis without current pathological fracture: Secondary | ICD-10-CM | POA: Diagnosis present

## 2022-04-20 DIAGNOSIS — F419 Anxiety disorder, unspecified: Secondary | ICD-10-CM | POA: Diagnosis present

## 2022-04-20 DIAGNOSIS — I080 Rheumatic disorders of both mitral and aortic valves: Secondary | ICD-10-CM | POA: Diagnosis present

## 2022-04-20 DIAGNOSIS — Z79890 Hormone replacement therapy (postmenopausal): Secondary | ICD-10-CM

## 2022-04-20 DIAGNOSIS — S82252A Displaced comminuted fracture of shaft of left tibia, initial encounter for closed fracture: Principal | ICD-10-CM | POA: Diagnosis present

## 2022-04-20 DIAGNOSIS — E039 Hypothyroidism, unspecified: Secondary | ICD-10-CM | POA: Diagnosis present

## 2022-04-20 DIAGNOSIS — K255 Chronic or unspecified gastric ulcer with perforation: Secondary | ICD-10-CM | POA: Diagnosis present

## 2022-04-20 DIAGNOSIS — Z1152 Encounter for screening for COVID-19: Secondary | ICD-10-CM

## 2022-04-20 DIAGNOSIS — L89513 Pressure ulcer of right ankle, stage 3: Secondary | ICD-10-CM | POA: Diagnosis present

## 2022-04-20 DIAGNOSIS — E538 Deficiency of other specified B group vitamins: Secondary | ICD-10-CM | POA: Diagnosis present

## 2022-04-20 LAB — CBC WITH DIFFERENTIAL/PLATELET
Abs Immature Granulocytes: 0.68 10*3/uL — ABNORMAL HIGH (ref 0.00–0.07)
Basophils Absolute: 0.1 10*3/uL (ref 0.0–0.1)
Basophils Relative: 0 %
Eosinophils Absolute: 0 10*3/uL (ref 0.0–0.5)
Eosinophils Relative: 0 %
HCT: 25.9 % — ABNORMAL LOW (ref 36.0–46.0)
Hemoglobin: 8.4 g/dL — ABNORMAL LOW (ref 12.0–15.0)
Immature Granulocytes: 4 %
Lymphocytes Relative: 15 %
Lymphs Abs: 3 10*3/uL (ref 0.7–4.0)
MCH: 31 pg (ref 26.0–34.0)
MCHC: 32.4 g/dL (ref 30.0–36.0)
MCV: 95.6 fL (ref 80.0–100.0)
Monocytes Absolute: 1.6 10*3/uL — ABNORMAL HIGH (ref 0.1–1.0)
Monocytes Relative: 8 %
Neutro Abs: 14.3 10*3/uL — ABNORMAL HIGH (ref 1.7–7.7)
Neutrophils Relative %: 73 %
Platelets: 178 10*3/uL (ref 150–400)
RBC: 2.71 MIL/uL — ABNORMAL LOW (ref 3.87–5.11)
RDW: 15.6 % — ABNORMAL HIGH (ref 11.5–15.5)
WBC: 19.6 10*3/uL — ABNORMAL HIGH (ref 4.0–10.5)
nRBC: 0 % (ref 0.0–0.2)

## 2022-04-20 LAB — BASIC METABOLIC PANEL
Anion gap: 7 (ref 5–15)
BUN: 42 mg/dL — ABNORMAL HIGH (ref 8–23)
CO2: 25 mmol/L (ref 22–32)
Calcium: 8.5 mg/dL — ABNORMAL LOW (ref 8.9–10.3)
Chloride: 100 mmol/L (ref 98–111)
Creatinine, Ser: 0.93 mg/dL (ref 0.44–1.00)
GFR, Estimated: >60 mL/min (ref >60)
Glucose, Bld: 76 mg/dL (ref 70–99)
Potassium: 3.9 mmol/L (ref 3.5–5.1)
Sodium: 132 mmol/L — ABNORMAL LOW (ref 135–145)

## 2022-04-20 MED ORDER — HYDROMORPHONE HCL 1 MG/ML IJ SOLN
0.5000 mg | INTRAMUSCULAR | Status: DC | PRN
  Administered 2022-04-20: 0.5 mg via INTRAVENOUS
  Filled 2022-04-20: qty 1

## 2022-04-20 MED ORDER — ONDANSETRON HCL 4 MG/2ML IJ SOLN: 4.0000 mg | Freq: Four times a day (QID) | INTRAMUSCULAR | Status: DC | PRN

## 2022-04-20 NOTE — ED Provider Notes (Signed)
[place] EMERGENCY DEPARTMENT AT [facility] Provider Note   CSN: [id number] Arrival date & time: 04/20/22  2125     History {Add pertinent medical, surgical, social history, OB history to HPI:1} Chief Complaint  Patient presents with   Leg Injury    Carolyn [name] is a 67 y.o. female with diastolic heart failure, chronic gastric ulcer, alcoholic cirrhosis and portal hypertensive gastropathy, chronic alcoholic pancreatitis, seizures, history of osteomyelitis, endometriosis, who presents with leg injury.   Patient fell in bathroom yesterday at facility stating that her left leg gave out on her. Walks with walker normally. Did not hit her head or lose consciousness. Facility done Xrays and shows Acute nondisplaced fx of left fibular head and distal shaft of tibia. Has severe left lower leg pain. No thinners, No LOC, no other complaints at this time. Denies numbness/tingling. Facility given Dilaudid @ 2030 for pain. Has a fentanyl patch in place currently that is changed q3days.   HPI     Home Medications Prior to Admission medications   Medication Sig Start Date End Date Taking? Authorizing Provider  acetaminophen (TYLENOL) 325 MG tablet Take 2 tablets (650 mg total) by mouth every 4 (four) hours as needed for mild pain (temp > 101.5). 04/12/22   Georgette Shell, MD  cholecalciferol (CHOLECALCIFEROL) 25 MCG tablet Take 1 tablet (1,000 Units total) by mouth daily. 04/13/22   Georgette Shell, MD  dexamethasone (DECADRON) 4 MG tablet Decadron 4 mg daily for 1 week Then 2 mg daily for 1 week Then 2 mg every other day for 1 week and then stop 04/13/22   Georgette Shell, MD  famotidine (PEPCID) 10 MG tablet Take 10 mg by mouth 2 (two) times daily.    [provider]  feeding supplement (ENSURE ENLIVE / ENSURE PLUS) LIQD Take 237 mLs by mouth 3 (three) times daily between meals. 04/12/22   Georgette Shell, MD  fentaNYL (DURAGESIC) 50 MCG/HR Place 1 patch  onto the skin every 3 (three) days. 04/13/22   Georgette Shell, MD  fluconazole (DIFLUCAN) 200 MG tablet Take 1 tablet (200 mg total) by mouth daily. 04/13/22   Georgette Shell, MD  furosemide (LASIX) 40 MG tablet TAKE 1 TABLET BY MOUTH EVERY DAY 03/23/22   Thornton Park, MD  gabapentin (NEURONTIN) 300 MG capsule Take 1 capsule (300 mg total) by mouth 2 (two) times daily. 04/12/22   Georgette Shell, MD  HYDROmorphone (DILAUDID) 2 MG tablet Take 1 tablet (2 mg total) by mouth every 4 (four) hours as needed for moderate pain ([facility]). 04/12/22   Georgette Shell, MD  insulin aspart (NOVOLOG) 100 UNIT/ML injection Inject 3 Units into the skin 3 (three) times daily with meals. 04/12/22   Georgette Shell, MD  insulin glargine-yfgn [facility]) 100 UNIT/ML injection Inject 0.15 mLs (15 Units total) into the skin daily. 04/13/22   Georgette Shell, MD  levothyroxine (SYNTHROID) 50 MCG tablet Take 1 tablet (50 mcg total) by mouth daily at 6 (six) AM. 04/13/22   Georgette Shell, MD  liothyronine (CYTOMEL) 25 MCG tablet Take 1 tablet (25 mcg total) by mouth daily. 04/13/22   Georgette Shell, MD  lipase/protease/amylase (CREON) 12000-38000 units CPEP capsule Take 2 capsules (24,000 Units total) by mouth 3 (three) times daily with meals. 02/11/22   Esterwood, Amy S, PA-C  loperamide (IMODIUM) 2 MG capsule Take 1 capsule (2 mg total) by mouth every 6 (six) hours as needed for  diarrhea or loose stools. 04/12/22   Georgette Shell, MD  metoprolol tartrate (LOPRESSOR) 25 MG tablet Take 1 tablet (25 mg total) by mouth 2 (two) times daily. 04/12/22   Georgette Shell, MD  Multiple Vitamin (MULITIVITAMIN WITH MINERALS) TABS Take 1 tablet by mouth daily.    [provider]  Nutritional Supplements (,FEEDING SUPPLEMENT, PROSOURCE PLUS) liquid Take 30 mLs by mouth 2 (two) times daily between meals. 04/12/22   Georgette Shell, MD  ondansetron (ZOFRAN) 4 MG tablet  Take 4 mg by mouth every 8 (eight) hours as needed for nausea or vomiting.    [provider]  pantoprazole (PROTONIX) 40 MG tablet Take 1 tablet (40 mg total) by mouth 2 (two) times daily before a meal. 02/11/22   Esterwood, Amy S, PA-C  phenobarbital (LUMINAL) 16.2 MG tablet Take 1 tablet (16.2 mg total) by mouth at bedtime. 04/12/22   Georgette Shell, MD  polyethylene glycol (MIRALAX / GLYCOLAX) 17 g packet Take 17 g by mouth 2 (two) times daily as needed for mild constipation. 01/25/22   Alma Friendly, MD  potassium chloride SA (KLOR-CON M) 20 MEQ tablet Take 1 tablet (20 mEq total) by mouth daily for 2 days. 04/12/22 04/14/22  Georgette Shell, MD  pyridOXINE (B-6) 100 MG tablet Take 1 tablet (100 mg total) by mouth daily. 04/13/22   Georgette Shell, MD  saccharomyces boulardii (FLORASTOR) 250 MG capsule Take 1 capsule (250 mg total) by mouth 2 (two) times daily. 04/12/22   Georgette Shell, MD  selenium 100 MCG TABS Take 1 tablet (100 mcg total) by mouth daily. 04/13/22   Georgette Shell, MD  silver sulfADIAZINE (SILVADENE) 1 % cream Apply topically daily. 04/13/22   Georgette Shell, MD  vitamin A 3 MG (10000 UNITS) capsule Take 1 capsule (10,000 Units total) by mouth every other day. 04/13/22   Georgette Shell, MD  vitamin B-12 (CYANOCOBALAMIN) 100 MCG tablet Take 100 mcg by mouth daily.    [provider]  vitamin E 180 MG (400 UNITS) capsule Take 1 capsule (400 Units total) by mouth daily. 04/13/22   Georgette Shell, MD  zinc sulfate 220 (50 Zn) MG capsule Take 1 capsule (220 mg total) by mouth daily. 01/09/22   Mercy Riding, MD  PROMETHAZINE HCL PO Take 4 mg by mouth.   06/06/11  [provider]  Ranitidine HCl (ZANTAC PO) Take by mouth.    06/06/11  [provider]      Allergies    Peanut-containing drug products    Review of Systems   Review of Systems Review of systems Negative for f/c, CP, SOB, abd pain.  A 10  point review of systems was performed and is negative unless otherwise reported in HPI.  Physical Exam Updated Vital Signs BP 104/67   Pulse 85   Temp 97.8 F (36.6 C) (Oral)   Resp 20   LMP 03/10/2009   SpO2 99%  Physical Exam General: Chronically ill-appearing female, lying in bed.  HEENT: PERRLA, NCAT, Sclera anicteric, MMM, trachea midline.  Cardiology: RRR, no murmurs/rubs/gallops. BL radial and DP pulses equal bilaterally.  Resp: Normal respiratory rate and effort. CTAB, no wheezes, rhonchi, crackles.  Abd: Soft, non-tender, non-distended. No rebound tenderness or guarding.  GU: Deferred. MSK: Severe TTP to left lower leg, tib/fib. No obvious deformities though pitting edema noted 2+ on left side > right side. Intact PT pulses bilaterally. Chronic wounds noted on BL  medial ankles. Erythema noted to left lower leg. Compartments are soft. Foot is warm with intact cap refill. Pelvis stable, nontender. ROM of L hip, knee, and ankle limited d/t pain.  Skin: warm, dry. No rashes or lesions. Back: No CVA tenderness Neuro: A&Ox4, CNs II-XII grossly intact. MAEs. Sensation grossly intact. Normal speech. Psych: Normal mood and affect.   ED Results / Procedures / Treatments   Labs (all labs ordered are listed, but only abnormal results are displayed) Labs Reviewed  CBC WITH DIFFERENTIAL/PLATELET  BASIC METABOLIC PANEL    EKG None  Radiology No results found.  Procedures Procedures  {Document cardiac monitor, telemetry assessment procedure when appropriate:1}  Medications Ordered in ED Medications - No data to display  ED Course/ Medical Decision Making/ A&P                          Medical Decision Making Amount and/or Complexity of Data Reviewed Labs: ordered. Radiology: ordered.    This patient presents to the ED for concern of leg pain c/f fracture, this involves an extensive number of treatment options, and is a complaint that carries with it a high risk of  complications and morbidity.  I considered the following differential and admission for this acute, potentially life threatening condition.   MDM:    Consider acute fracture or dislocation. Reportedly fracture of tibial shaft and fibular head but will obtain XRs. No c/f compartment syndrome, seems neurovascularly intact. ROM limited d/t pain. Did not hit her head or lose consciousness, has no other complaints.     Labs: I Ordered, and personally interpreted labs.  The pertinent results include:  CBC, BMP  Imaging Studies ordered: I ordered imaging studies including XRs I independently visualized and interpreted imaging. I agree with the radiologist interpretation  Additional history obtained from chart review, husband at bedside.    Reevaluation: After the interventions noted above, I reevaluated the patient and found that they have :{resolved/improved/worsened:23923::"improved"}  Social Determinants of Health: Lives at facility  Disposition:  ***  Co morbidities that complicate the patient evaluation  Past Medical History:  Diagnosis Date   Alcoholic cirrhosis ([place])    Chronic pancreatitis ([place])    Endometriosis    GERD (gastroesophageal reflux disease)    Liver disease    Malabsorption    MALABSORPTION SYNDROME   Osteoporosis 03/2011   t score -2.5   Pancreatitis    due to cyst and tumors due to calcifications   Seizure ([place])    no seizure disorder   Vitamin D deficiency 2012   VIT D 15     Medicines No orders of the defined types were placed in this encounter.   I have reviewed the patients home medicines and have made adjustments as needed  Problem List / ED Course: Problem List Items Addressed This Visit   None        {Document critical care time when appropriate:1} {Document review of labs and clinical decision tools ie heart score, Chads2Vasc2 etc:1}  {Document your independent review of radiology images, and any outside records:1} {Document your  discussion with family members, caretakers, and with consultants:1} {Document social determinants of health affecting pt's care:1} {Document your decision making why or why not admission, treatments were needed:1}  This note was created using dictation software, which may contain spelling or grammatical errors.

## 2022-04-20 NOTE — ED Triage Notes (Signed)
Fallen yesterday at facility. Facility done Xrays and shows Acute nondisplaced fx of left fibular head and distal shaft of tibia. Pt is Aox4, Facility given Dilaudid @ 2030 for pain. No thinners, No LOC, no other complaints at this time.

## 2022-04-21 ENCOUNTER — Other Ambulatory Visit: Payer: Self-pay

## 2022-04-21 ENCOUNTER — Inpatient Hospital Stay (HOSPITAL_COMMUNITY): Payer: Medicare PPO | Admitting: Certified Registered"

## 2022-04-21 ENCOUNTER — Encounter (HOSPITAL_COMMUNITY)
Admission: EM | Disposition: A | Discharge status: Skilled Nursing Facility | Payer: Self-pay | Source: Skilled Nursing Facility | Attending: Internal Medicine

## 2022-04-21 ENCOUNTER — Encounter (HOSPITAL_COMMUNITY): Payer: Self-pay | Admitting: Internal Medicine

## 2022-04-21 ENCOUNTER — Inpatient Hospital Stay (HOSPITAL_COMMUNITY): Payer: Medicare PPO

## 2022-04-21 ENCOUNTER — Other Ambulatory Visit: Payer: Self-pay | Admitting: Gastroenterology

## 2022-04-21 DIAGNOSIS — S82402A Unspecified fracture of shaft of left fibula, initial encounter for closed fracture: Secondary | ICD-10-CM

## 2022-04-21 DIAGNOSIS — K7030 Alcoholic cirrhosis of liver without ascites: Secondary | ICD-10-CM | POA: Diagnosis present

## 2022-04-21 DIAGNOSIS — D638 Anemia in other chronic diseases classified elsewhere: Secondary | ICD-10-CM | POA: Diagnosis present

## 2022-04-21 DIAGNOSIS — Z66 Do not resuscitate: Secondary | ICD-10-CM | POA: Diagnosis present

## 2022-04-21 DIAGNOSIS — S82202A Unspecified fracture of shaft of left tibia, initial encounter for closed fracture: Secondary | ICD-10-CM

## 2022-04-21 DIAGNOSIS — K279 Peptic ulcer, site unspecified, unspecified as acute or chronic, without hemorrhage or perforation: Secondary | ICD-10-CM | POA: Diagnosis not present

## 2022-04-21 DIAGNOSIS — K219 Gastro-esophageal reflux disease without esophagitis: Secondary | ICD-10-CM | POA: Diagnosis present

## 2022-04-21 DIAGNOSIS — Z794 Long term (current) use of insulin: Secondary | ICD-10-CM | POA: Diagnosis not present

## 2022-04-21 DIAGNOSIS — F1021 Alcohol dependence, in remission: Secondary | ICD-10-CM | POA: Diagnosis present

## 2022-04-21 DIAGNOSIS — I5032 Chronic diastolic (congestive) heart failure: Secondary | ICD-10-CM | POA: Diagnosis present

## 2022-04-21 DIAGNOSIS — Z1152 Encounter for screening for COVID-19: Secondary | ICD-10-CM | POA: Diagnosis not present

## 2022-04-21 DIAGNOSIS — S82252A Displaced comminuted fracture of shaft of left tibia, initial encounter for closed fracture: Secondary | ICD-10-CM | POA: Diagnosis present

## 2022-04-21 DIAGNOSIS — K860 Alcohol-induced chronic pancreatitis: Secondary | ICD-10-CM | POA: Diagnosis present

## 2022-04-21 DIAGNOSIS — F419 Anxiety disorder, unspecified: Secondary | ICD-10-CM | POA: Diagnosis present

## 2022-04-21 DIAGNOSIS — E538 Deficiency of other specified B group vitamins: Secondary | ICD-10-CM | POA: Diagnosis present

## 2022-04-21 DIAGNOSIS — S82832A Other fracture of upper and lower end of left fibula, initial encounter for closed fracture: Secondary | ICD-10-CM | POA: Diagnosis present

## 2022-04-21 DIAGNOSIS — G894 Chronic pain syndrome: Secondary | ICD-10-CM | POA: Diagnosis present

## 2022-04-21 DIAGNOSIS — E43 Unspecified severe protein-calorie malnutrition: Secondary | ICD-10-CM | POA: Diagnosis present

## 2022-04-21 DIAGNOSIS — W1830XA Fall on same level, unspecified, initial encounter: Secondary | ICD-10-CM | POA: Diagnosis present

## 2022-04-21 DIAGNOSIS — K766 Portal hypertension: Secondary | ICD-10-CM | POA: Diagnosis present

## 2022-04-21 DIAGNOSIS — D72829 Elevated white blood cell count, unspecified: Secondary | ICD-10-CM | POA: Diagnosis present

## 2022-04-21 DIAGNOSIS — E039 Hypothyroidism, unspecified: Secondary | ICD-10-CM | POA: Diagnosis present

## 2022-04-21 DIAGNOSIS — K3189 Other diseases of stomach and duodenum: Secondary | ICD-10-CM | POA: Diagnosis present

## 2022-04-21 DIAGNOSIS — M810 Age-related osteoporosis without current pathological fracture: Secondary | ICD-10-CM | POA: Diagnosis present

## 2022-04-21 DIAGNOSIS — Z91010 Allergy to peanuts: Secondary | ICD-10-CM | POA: Diagnosis not present

## 2022-04-21 DIAGNOSIS — I509 Heart failure, unspecified: Secondary | ICD-10-CM | POA: Diagnosis not present

## 2022-04-21 DIAGNOSIS — K255 Chronic or unspecified gastric ulcer with perforation: Secondary | ICD-10-CM | POA: Diagnosis present

## 2022-04-21 DIAGNOSIS — Z681 Body mass index (BMI) 19 or less, adult: Secondary | ICD-10-CM | POA: Diagnosis not present

## 2022-04-21 DIAGNOSIS — L89513 Pressure ulcer of right ankle, stage 3: Secondary | ICD-10-CM | POA: Diagnosis present

## 2022-04-21 DIAGNOSIS — I080 Rheumatic disorders of both mitral and aortic valves: Secondary | ICD-10-CM | POA: Diagnosis present

## 2022-04-21 DIAGNOSIS — Y92128 Other place in nursing home as the place of occurrence of the external cause: Secondary | ICD-10-CM | POA: Diagnosis not present

## 2022-04-21 HISTORY — PX: TIBIA IM NAIL INSERTION: SHX2516

## 2022-04-21 LAB — COMPREHENSIVE METABOLIC PANEL
ALT: 46 U/L — ABNORMAL HIGH (ref 0–44)
AST: 35 U/L (ref 15–41)
Albumin: 3.2 g/dL — ABNORMAL LOW (ref 3.5–5.0)
Alkaline Phosphatase: 91 U/L (ref 38–126)
Anion gap: 8 (ref 5–15)
BUN: 35 mg/dL — ABNORMAL HIGH (ref 8–23)
CO2: 26 mmol/L (ref 22–32)
Calcium: 8.8 mg/dL — ABNORMAL LOW (ref 8.9–10.3)
Chloride: 99 mmol/L (ref 98–111)
Creatinine, Ser: 0.73 mg/dL (ref 0.44–1.00)
GFR, Estimated: >60 mL/min (ref >60)
Glucose, Bld: 73 mg/dL (ref 70–99)
Potassium: 3.7 mmol/L (ref 3.5–5.1)
Sodium: 133 mmol/L — ABNORMAL LOW (ref 135–145)
Total Bilirubin: 0.5 mg/dL (ref 0.3–1.2)
Total Protein: 6 g/dL — ABNORMAL LOW (ref 6.5–8.1)

## 2022-04-21 LAB — TYPE AND SCREEN
ABO/RH(D): B POS
ABO/RH(D): B POS
Antibody Screen: NEGATIVE
Antibody Screen: NEGATIVE

## 2022-04-21 LAB — CBC
HCT: 27.1 % — ABNORMAL LOW (ref 36.0–46.0)
Hemoglobin: 9.2 g/dL — ABNORMAL LOW (ref 12.0–15.0)
MCH: 32.1 pg (ref 26.0–34.0)
MCHC: 33.9 g/dL (ref 30.0–36.0)
MCV: 94.4 fL (ref 80.0–100.0)
Platelets: 182 10*3/uL (ref 150–400)
RBC: 2.87 MIL/uL — ABNORMAL LOW (ref 3.87–5.11)
RDW: 15.3 % (ref 11.5–15.5)
WBC: 15.6 10*3/uL — ABNORMAL HIGH (ref 4.0–10.5)
nRBC: 0 % (ref 0.0–0.2)

## 2022-04-21 LAB — RESP PANEL BY RT-PCR (RSV, FLU A&B, COVID)  RVPGX2
Influenza A by PCR: POSITIVE — AB
Influenza B by PCR: NEGATIVE
Resp Syncytial Virus by PCR: NEGATIVE
SARS Coronavirus 2 by RT PCR: NEGATIVE

## 2022-04-21 LAB — MRSA NEXT GEN BY PCR, NASAL: MRSA by PCR Next Gen: NOT DETECTED

## 2022-04-21 SURGERY — INSERTION, INTRAMEDULLARY ROD, TIBIA
Anesthesia: General | Laterality: Left

## 2022-04-21 MED ORDER — PANTOPRAZOLE SODIUM 40 MG PO TBEC
40.0000 mg | DELAYED_RELEASE_TABLET | Freq: Two times a day (BID) | ORAL | Status: DC
  Administered 2022-04-21 – 2022-04-23 (×5): 40 mg via ORAL
  Filled 2022-04-21 (×5): qty 1

## 2022-04-21 MED ORDER — PHENYLEPHRINE HCL-NACL 20-0.9 MG/250ML-% IV SOLN
INTRAVENOUS | Status: DC | PRN
  Administered 2022-04-21: 25 ug/min via INTRAVENOUS

## 2022-04-21 MED ORDER — LIDOCAINE 2% (20 MG/ML) 5 ML SYRINGE
INTRAMUSCULAR | Status: DC | PRN
  Administered 2022-04-21: 40 mg via INTRAVENOUS

## 2022-04-21 MED ORDER — ZINC SULFATE 220 (50 ZN) MG PO CAPS
220.0000 mg | ORAL_CAPSULE | Freq: Every day | ORAL | Status: DC
  Administered 2022-04-21 – 2022-04-23 (×3): 220 mg via ORAL
  Filled 2022-04-21 (×3): qty 1

## 2022-04-21 MED ORDER — MORPHINE SULFATE (PF) 2 MG/ML IV SOLN
0.5000 mg | INTRAVENOUS | Status: DC | PRN
  Administered 2022-04-21 – 2022-04-23 (×4): 1 mg via INTRAVENOUS
  Filled 2022-04-21 (×4): qty 1

## 2022-04-21 MED ORDER — LIOTHYRONINE SODIUM 25 MCG PO TABS
25.0000 ug | ORAL_TABLET | Freq: Every day | ORAL | Status: DC
  Administered 2022-04-22 – 2022-04-23 (×2): 25 ug via ORAL
  Filled 2022-04-21 (×3): qty 1

## 2022-04-21 MED ORDER — GABAPENTIN 300 MG PO CAPS
300.0000 mg | ORAL_CAPSULE | Freq: Two times a day (BID) | ORAL | Status: DC
  Administered 2022-04-21 – 2022-04-23 (×5): 300 mg via ORAL
  Filled 2022-04-21 (×5): qty 1

## 2022-04-21 MED ORDER — ONDANSETRON HCL 4 MG/2ML IJ SOLN: 4.0000 mg | Freq: Four times a day (QID) | INTRAMUSCULAR | Status: DC | PRN

## 2022-04-21 MED ORDER — PANCRELIPASE (LIP-PROT-AMYL) 12000-38000 UNITS PO CPEP
24000.0000 [IU] | ORAL_CAPSULE | Freq: Three times a day (TID) | ORAL | Status: DC
  Administered 2022-04-22 – 2022-04-23 (×5): 24000 [IU] via ORAL
  Filled 2022-04-21 (×6): qty 2

## 2022-04-21 MED ORDER — MIDAZOLAM HCL 2 MG/2ML IJ SOLN
INTRAMUSCULAR | Status: DC | PRN
  Administered 2022-04-21 (×2): 1 mg via INTRAVENOUS

## 2022-04-21 MED ORDER — ENOXAPARIN SODIUM 40 MG/0.4ML IJ SOSY
40.0000 mg | PREFILLED_SYRINGE | INTRAMUSCULAR | Status: DC
  Administered 2022-04-22: 40 mg via SUBCUTANEOUS
  Filled 2022-04-21: qty 0.4

## 2022-04-21 MED ORDER — AMISULPRIDE (ANTIEMETIC) 5 MG/2ML IV SOLN: 10.0000 mg | Freq: Once | INTRAVENOUS | Status: DC | PRN

## 2022-04-21 MED ORDER — FUROSEMIDE 40 MG PO TABS
40.0000 mg | ORAL_TABLET | Freq: Every day | ORAL | Status: DC
  Filled 2022-04-21: qty 1

## 2022-04-21 MED ORDER — METOCLOPRAMIDE HCL 5 MG PO TABS: 5.0000 mg | ORAL_TABLET | Freq: Three times a day (TID) | ORAL | Status: DC | PRN

## 2022-04-21 MED ORDER — CHLORHEXIDINE GLUCONATE 0.12 % MT SOLN: 15.0000 mL | Freq: Once | OROMUCOSAL | Status: AC

## 2022-04-21 MED ORDER — HYDROCODONE-ACETAMINOPHEN 7.5-325 MG PO TABS
1.0000 | ORAL_TABLET | ORAL | Status: DC | PRN
  Administered 2022-04-21 – 2022-04-23 (×6): 2 via ORAL
  Filled 2022-04-21 (×6): qty 2

## 2022-04-21 MED ORDER — DOCUSATE SODIUM 100 MG PO CAPS
100.0000 mg | ORAL_CAPSULE | Freq: Two times a day (BID) | ORAL | Status: DC
  Administered 2022-04-21 – 2022-04-23 (×4): 100 mg via ORAL
  Filled 2022-04-21 (×4): qty 1

## 2022-04-21 MED ORDER — HYDROMORPHONE HCL 2 MG PO TABS
2.0000 mg | ORAL_TABLET | ORAL | Status: DC | PRN
  Administered 2022-04-21 – 2022-04-22 (×3): 2 mg via ORAL
  Filled 2022-04-21 (×3): qty 1

## 2022-04-21 MED ORDER — ACETAMINOPHEN 325 MG PO TABS: 325.0000 mg | ORAL_TABLET | Freq: Four times a day (QID) | ORAL | Status: DC | PRN

## 2022-04-21 MED ORDER — METOPROLOL TARTRATE 25 MG PO TABS
25.0000 mg | ORAL_TABLET | Freq: Two times a day (BID) | ORAL | Status: DC
  Administered 2022-04-21: 25 mg via ORAL
  Filled 2022-04-21: qty 1

## 2022-04-21 MED ORDER — KETOROLAC TROMETHAMINE 30 MG/ML IJ SOLN
30.0000 mg | Freq: Once | INTRAMUSCULAR | Status: AC
  Administered 2022-04-21: 30 mg via INTRAVENOUS

## 2022-04-21 MED ORDER — CALCIUM CARBONATE ANTACID 500 MG PO CHEW
1.0000 | CHEWABLE_TABLET | Freq: Three times a day (TID) | ORAL | Status: DC
  Administered 2022-04-22 – 2022-04-23 (×5): 200 mg via ORAL
  Filled 2022-04-21 (×5): qty 1

## 2022-04-21 MED ORDER — HYDROMORPHONE HCL 1 MG/ML IJ SOLN
INTRAMUSCULAR | Status: AC
  Filled 2022-04-21: qty 1

## 2022-04-21 MED ORDER — DEXAMETHASONE 2 MG PO TABS: 2.0000 mg | ORAL_TABLET | ORAL | Status: DC

## 2022-04-21 MED ORDER — HYDROCODONE-ACETAMINOPHEN 5-325 MG PO TABS: 1.0000 | ORAL_TABLET | ORAL | Status: DC | PRN

## 2022-04-21 MED ORDER — SENNOSIDES-DOCUSATE SODIUM 8.6-50 MG PO TABS: 1.0000 | ORAL_TABLET | Freq: Every evening | ORAL | Status: DC | PRN

## 2022-04-21 MED ORDER — MORPHINE SULFATE (PF) 2 MG/ML IV SOLN
0.5000 mg | INTRAVENOUS | Status: DC | PRN
  Administered 2022-04-21 (×2): 0.5 mg via INTRAVENOUS
  Filled 2022-04-21 (×3): qty 1

## 2022-04-21 MED ORDER — LIDOCAINE 2% (20 MG/ML) 5 ML SYRINGE
INTRAMUSCULAR | Status: AC
  Filled 2022-04-21: qty 5

## 2022-04-21 MED ORDER — VITAMIN B-12 100 MCG PO TABS
100.0000 ug | ORAL_TABLET | Freq: Every day | ORAL | Status: DC
  Administered 2022-04-22 – 2022-04-23 (×2): 100 ug via ORAL
  Filled 2022-04-21 (×4): qty 1

## 2022-04-21 MED ORDER — FENTANYL 50 MCG/HR TD PT72
1.0000 | MEDICATED_PATCH | TRANSDERMAL | Status: DC
  Administered 2022-04-22: 1 via TRANSDERMAL
  Filled 2022-04-21: qty 1

## 2022-04-21 MED ORDER — CHLORHEXIDINE GLUCONATE 0.12 % MT SOLN
OROMUCOSAL | Status: AC
  Administered 2022-04-21: 15 mL via OROMUCOSAL
  Filled 2022-04-21: qty 15

## 2022-04-21 MED ORDER — POVIDONE-IODINE 10 % EX SWAB
2.0000 | Freq: Once | CUTANEOUS | Status: AC
  Administered 2022-04-21: 2 via TOPICAL

## 2022-04-21 MED ORDER — CHLORHEXIDINE GLUCONATE 4 % EX LIQD
60.0000 mL | Freq: Once | CUTANEOUS | Status: AC
  Administered 2022-04-21: 4 via TOPICAL
  Filled 2022-04-21: qty 60

## 2022-04-21 MED ORDER — PROPOFOL 10 MG/ML IV BOLUS
INTRAVENOUS | Status: DC | PRN
  Administered 2022-04-21: 70 mg via INTRAVENOUS

## 2022-04-21 MED ORDER — DOXYCYCLINE HYCLATE 100 MG PO TABS
100.0000 mg | ORAL_TABLET | Freq: Two times a day (BID) | ORAL | Status: DC
  Administered 2022-04-21 – 2022-04-23 (×5): 100 mg via ORAL
  Filled 2022-04-21 (×5): qty 1

## 2022-04-21 MED ORDER — MELATONIN 5 MG PO TABS
5.0000 mg | ORAL_TABLET | Freq: Every day | ORAL | Status: DC
  Administered 2022-04-21 – 2022-04-22 (×3): 5 mg via ORAL
  Filled 2022-04-21 (×3): qty 1

## 2022-04-21 MED ORDER — ONDANSETRON HCL 4 MG PO TABS: 4.0000 mg | ORAL_TABLET | Freq: Four times a day (QID) | ORAL | Status: DC | PRN

## 2022-04-21 MED ORDER — PROMETHAZINE HCL 25 MG/ML IJ SOLN: 6.2500 mg | INTRAMUSCULAR | Status: DC | PRN

## 2022-04-21 MED ORDER — SACCHAROMYCES BOULARDII 250 MG PO CAPS
250.0000 mg | ORAL_CAPSULE | Freq: Two times a day (BID) | ORAL | Status: DC
  Administered 2022-04-21 – 2022-04-23 (×4): 250 mg via ORAL
  Filled 2022-04-21 (×4): qty 1

## 2022-04-21 MED ORDER — LEVOTHYROXINE SODIUM 50 MCG PO TABS
50.0000 ug | ORAL_TABLET | Freq: Every day | ORAL | Status: DC
  Administered 2022-04-22 – 2022-04-23 (×2): 50 ug via ORAL
  Filled 2022-04-21 (×2): qty 1

## 2022-04-21 MED ORDER — DEXAMETHASONE SODIUM PHOSPHATE 10 MG/ML IJ SOLN
INTRAMUSCULAR | Status: AC
  Filled 2022-04-21: qty 1

## 2022-04-21 MED ORDER — DEXAMETHASONE SODIUM PHOSPHATE 10 MG/ML IJ SOLN
INTRAMUSCULAR | Status: DC | PRN
  Administered 2022-04-21: 4 mg via INTRAVENOUS

## 2022-04-21 MED ORDER — ACETAMINOPHEN 500 MG PO TABS
500.0000 mg | ORAL_TABLET | Freq: Four times a day (QID) | ORAL | Status: DC
  Administered 2022-04-22: 500 mg via ORAL
  Filled 2022-04-21 (×2): qty 1

## 2022-04-21 MED ORDER — CELECOXIB 200 MG PO CAPS
200.0000 mg | ORAL_CAPSULE | Freq: Once | ORAL | Status: AC
  Administered 2022-04-21: 200 mg via ORAL
  Filled 2022-04-21: qty 1

## 2022-04-21 MED ORDER — FLUCONAZOLE 200 MG PO TABS
200.0000 mg | ORAL_TABLET | Freq: Every day | ORAL | Status: DC
  Administered 2022-04-21 – 2022-04-23 (×3): 200 mg via ORAL
  Filled 2022-04-21 (×3): qty 1

## 2022-04-21 MED ORDER — HYDROMORPHONE HCL 1 MG/ML IJ SOLN
0.2500 mg | INTRAMUSCULAR | Status: DC | PRN
  Administered 2022-04-21 (×3): 0.5 mg via INTRAVENOUS

## 2022-04-21 MED ORDER — LACTATED RINGERS IV SOLN: INTRAVENOUS | Status: DC

## 2022-04-21 MED ORDER — KETOROLAC TROMETHAMINE 30 MG/ML IJ SOLN
INTRAMUSCULAR | Status: AC
  Filled 2022-04-21: qty 1

## 2022-04-21 MED ORDER — ONDANSETRON HCL 4 MG/2ML IJ SOLN
INTRAMUSCULAR | Status: AC
  Filled 2022-04-21: qty 2

## 2022-04-21 MED ORDER — METOCLOPRAMIDE HCL 5 MG/ML IJ SOLN: 5.0000 mg | Freq: Three times a day (TID) | INTRAMUSCULAR | Status: DC | PRN

## 2022-04-21 MED ORDER — ENSURE ENLIVE PO LIQD
237.0000 mL | Freq: Three times a day (TID) | ORAL | Status: DC
  Administered 2022-04-21 – 2022-04-23 (×5): 237 mL via ORAL
  Filled 2022-04-21: qty 237

## 2022-04-21 MED ORDER — ADULT MULTIVITAMIN W/MINERALS CH: 1.0000 | ORAL_TABLET | Freq: Every day | ORAL | Status: DC

## 2022-04-21 MED ORDER — DEXAMETHASONE 2 MG PO TABS: 2.0000 mg | ORAL_TABLET | Freq: Every day | ORAL | Status: DC

## 2022-04-21 MED ORDER — ACETAMINOPHEN 500 MG PO TABS
1000.0000 mg | ORAL_TABLET | Freq: Once | ORAL | Status: AC
  Administered 2022-04-21: 1000 mg via ORAL
  Filled 2022-04-21: qty 2

## 2022-04-21 MED ORDER — ADULT MULTIVITAMIN W/MINERALS CH
1.0000 | ORAL_TABLET | Freq: Two times a day (BID) | ORAL | Status: DC
  Administered 2022-04-22 – 2022-04-23 (×3): 1 via ORAL
  Filled 2022-04-21 (×3): qty 1

## 2022-04-21 MED ORDER — FENTANYL CITRATE (PF) 250 MCG/5ML IJ SOLN
INTRAMUSCULAR | Status: DC | PRN
  Administered 2022-04-21 (×4): 50 ug via INTRAVENOUS

## 2022-04-21 MED ORDER — AMOXICILLIN-POT CLAVULANATE 875-125 MG PO TABS
1.0000 | ORAL_TABLET | Freq: Two times a day (BID) | ORAL | Status: DC
  Administered 2022-04-21 – 2022-04-23 (×5): 1 via ORAL
  Filled 2022-04-21 (×5): qty 1

## 2022-04-21 MED ORDER — PHENOBARBITAL 32.4 MG PO TABS
16.5000 mg | ORAL_TABLET | Freq: Every day | ORAL | Status: DC
  Administered 2022-04-21 – 2022-04-22 (×3): 16.2 mg via ORAL
  Filled 2022-04-21 (×3): qty 1

## 2022-04-21 MED ORDER — CEFAZOLIN SODIUM-DEXTROSE 2-4 GM/100ML-% IV SOLN
2.0000 g | INTRAVENOUS | Status: AC
  Administered 2022-04-21: 2 g via INTRAVENOUS
  Filled 2022-04-21: qty 100

## 2022-04-21 MED ORDER — VITAMIN D 25 MCG (1000 UNIT) PO TABS
1000.0000 [IU] | ORAL_TABLET | Freq: Every day | ORAL | Status: DC
  Administered 2022-04-21 – 2022-04-23 (×3): 1000 [IU] via ORAL
  Filled 2022-04-21 (×3): qty 1

## 2022-04-21 MED ORDER — FAMOTIDINE 20 MG PO TABS
10.0000 mg | ORAL_TABLET | Freq: Two times a day (BID) | ORAL | Status: DC
  Administered 2022-04-21 – 2022-04-23 (×4): 10 mg via ORAL
  Filled 2022-04-21 (×5): qty 1

## 2022-04-21 MED ORDER — POLYETHYLENE GLYCOL 3350 17 G PO PACK: 17.0000 g | PACK | Freq: Two times a day (BID) | ORAL | Status: DC | PRN

## 2022-04-21 MED ORDER — 0.9 % SODIUM CHLORIDE (POUR BTL) OPTIME
TOPICAL | Status: DC | PRN
  Administered 2022-04-21: 1000 mL

## 2022-04-21 MED ORDER — SELENIUM 200 MCG PO TABS
100.0000 ug | ORAL_TABLET | Freq: Every day | ORAL | Status: DC
  Administered 2022-04-22 – 2022-04-23 (×2): 100 ug via ORAL
  Filled 2022-04-21 (×4): qty 1

## 2022-04-21 MED ORDER — VITAMIN A 3 MG (10000 UNIT) PO CAPS
10000.0000 [IU] | ORAL_CAPSULE | ORAL | Status: DC
  Administered 2022-04-22 – 2022-04-23 (×2): 10000 [IU] via ORAL
  Filled 2022-04-21 (×3): qty 1

## 2022-04-21 MED ORDER — ONDANSETRON HCL 4 MG/2ML IJ SOLN
INTRAMUSCULAR | Status: DC | PRN
  Administered 2022-04-21: 4 mg via INTRAVENOUS

## 2022-04-21 MED ORDER — MIDAZOLAM HCL 2 MG/2ML IJ SOLN
INTRAMUSCULAR | Status: AC
  Filled 2022-04-21: qty 2

## 2022-04-21 MED ORDER — LOPERAMIDE HCL 2 MG PO CAPS
2.0000 mg | ORAL_CAPSULE | Freq: Four times a day (QID) | ORAL | Status: DC | PRN
  Administered 2022-04-23: 2 mg via ORAL
  Filled 2022-04-21: qty 1

## 2022-04-21 MED ORDER — VITAMIN E 45 MG (100 UNIT) PO CAPS
400.0000 [IU] | ORAL_CAPSULE | Freq: Every day | ORAL | Status: DC
  Administered 2022-04-21 – 2022-04-23 (×3): 400 [IU] via ORAL
  Filled 2022-04-21 (×4): qty 4

## 2022-04-21 MED ORDER — ORAL CARE MOUTH RINSE: 15.0000 mL | Freq: Once | OROMUCOSAL | Status: AC

## 2022-04-21 MED ORDER — FENTANYL CITRATE (PF) 250 MCG/5ML IJ SOLN
INTRAMUSCULAR | Status: AC
  Filled 2022-04-21: qty 5

## 2022-04-21 MED ORDER — VITAMIN B-6 100 MG PO TABS
100.0000 mg | ORAL_TABLET | Freq: Every day | ORAL | Status: DC
  Administered 2022-04-22 – 2022-04-23 (×2): 100 mg via ORAL
  Filled 2022-04-21 (×4): qty 1

## 2022-04-21 MED ORDER — DEXAMETHASONE 2 MG PO TABS
2.0000 mg | ORAL_TABLET | Freq: Every day | ORAL | Status: DC
  Administered 2022-04-21 – 2022-04-23 (×3): 2 mg via ORAL
  Filled 2022-04-21 (×3): qty 1

## 2022-04-21 SURGICAL SUPPLY — 66 items
BAG COUNTER SPONGE SURGICOUNT (BAG) ×2 IMPLANT
BAG SPNG CNTER NS LX DISP (BAG) ×1
BANDAGE ESMARK 6X9 LF (GAUZE/BANDAGES/DRESSINGS) IMPLANT
BIT DRILL 4.3 CALIBRATED (DRILL) IMPLANT
BLADE CLIPPER SURG (BLADE) IMPLANT
BLADE SURG 15 STRL LF DISP TIS (BLADE) ×2 IMPLANT
BLADE SURG 15 STRL SS (BLADE) ×1
BNDG CMPR 9X6 STRL LF SNTH (GAUZE/BANDAGES/DRESSINGS)
BNDG COHESIVE 6X5 TAN STRL LF (GAUZE/BANDAGES/DRESSINGS) ×2 IMPLANT
BNDG ELASTIC 4X5.8 VLCR STR LF (GAUZE/BANDAGES/DRESSINGS) ×2 IMPLANT
BNDG ELASTIC 6X5.8 VLCR STR LF (GAUZE/BANDAGES/DRESSINGS) ×2 IMPLANT
BNDG ESMARK 6X9 LF (GAUZE/BANDAGES/DRESSINGS)
BNDG GAUZE DERMACEA FLUFF 4 (GAUZE/BANDAGES/DRESSINGS) ×2 IMPLANT
BNDG GZE DERMACEA 4 6PLY (GAUZE/BANDAGES/DRESSINGS)
COVER SURGICAL LIGHT HANDLE (MISCELLANEOUS) ×4 IMPLANT
CUFF TOURN SGL QUICK 34 (TOURNIQUET CUFF)
CUFF TOURN SGL QUICK 42 (TOURNIQUET CUFF) IMPLANT
CUFF TRNQT CYL 34X4.125X (TOURNIQUET CUFF) IMPLANT
DRAPE C-ARM 42X72 X-RAY (DRAPES) ×2 IMPLANT
DRAPE HALF SHEET 40X57 (DRAPES) ×4 IMPLANT
DRAPE IMP U-DRAPE 54X76 (DRAPES) ×2 IMPLANT
DRAPE INCISE IOBAN 66X45 STRL (DRAPES) IMPLANT
DRAPE ORTHO SPLIT 77X108 STRL (DRAPES) ×2
DRAPE SURG ORHT 6 SPLT 77X108 (DRAPES) ×4 IMPLANT
DRAPE U-SHAPE 47X51 STRL (DRAPES) ×2 IMPLANT
DRILL 4.3 CALIBRATED (DRILL) ×1
DURAPREP 26ML APPLICATOR (WOUND CARE) ×2 IMPLANT
ELECT REM PT RETURN 9FT ADLT (ELECTROSURGICAL) ×1
ELECTRODE REM PT RTRN 9FT ADLT (ELECTROSURGICAL) ×2 IMPLANT
FACESHIELD WRAPAROUND (MASK) IMPLANT
FACESHIELD WRAPAROUND OR TEAM (MASK) IMPLANT
GAUZE PAD ABD 8X10 STRL (GAUZE/BANDAGES/DRESSINGS) IMPLANT
GAUZE SPONGE 4X4 12PLY STRL (GAUZE/BANDAGES/DRESSINGS) ×4 IMPLANT
GAUZE XEROFORM 5X9 LF (GAUZE/BANDAGES/DRESSINGS) ×2 IMPLANT
GLOVE BIOGEL PI IND STRL 8 (GLOVE) ×4 IMPLANT
GLOVE ORTHO TXT STRL SZ7.5 (GLOVE) ×2 IMPLANT
GOWN STRL REUS W/ TWL LRG LVL3 (GOWN DISPOSABLE) ×2 IMPLANT
GOWN STRL REUS W/ TWL XL LVL3 (GOWN DISPOSABLE) ×2 IMPLANT
GOWN STRL REUS W/TWL 2XL LVL3 (GOWN DISPOSABLE) ×2 IMPLANT
GOWN STRL REUS W/TWL LRG LVL3 (GOWN DISPOSABLE) ×1
GOWN STRL REUS W/TWL XL LVL3 (GOWN DISPOSABLE) ×1
GUIDEPIN VERSANAIL DSP 3.2X444 (ORTHOPEDIC DISPOSABLE SUPPLIES) IMPLANT
GUIDEWIRE NATURAL NAIL 3X100 (WIRE) IMPLANT
KIT BASIN OR (CUSTOM PROCEDURE TRAY) ×2 IMPLANT
KIT TURNOVER KIT B (KITS) ×2 IMPLANT
MANIFOLD NEPTUNE II (INSTRUMENTS) ×2 IMPLANT
NAIL TIB IM 9.3X320 (Nail) IMPLANT
NS IRRIG 1000ML POUR BTL (IV SOLUTION) ×2 IMPLANT
PACK GENERAL/GYN (CUSTOM PROCEDURE TRAY) ×2 IMPLANT
PACK UNIVERSAL I (CUSTOM PROCEDURE TRAY) ×2 IMPLANT
PAD ARMBOARD 7.5X6 YLW CONV (MISCELLANEOUS) ×4 IMPLANT
PAD CAST CTTN 4X4 STRL (SOFTGOODS) IMPLANT
PADDING CAST COTTON 4X4 STRL (SOFTGOODS) ×1
REAMER HEAD TAPER 12.0 (ORTHOPEDIC DISPOSABLE SUPPLIES) IMPLANT
SCREW CANC FEM FA 5X30 (Screw) IMPLANT
SCREW CORT 2X3X229 T5 (Screw) IMPLANT
STAPLER VISISTAT 35W (STAPLE) ×2 IMPLANT
STOCKINETTE IMPERVIOUS LG (DRAPES) ×2 IMPLANT
SUT VIC AB 0 CT1 27 (SUTURE) ×1
SUT VIC AB 0 CT1 27XBRD ANBCTR (SUTURE) ×2 IMPLANT
SUT VIC AB 2-0 CT1 27 (SUTURE) ×1
SUT VIC AB 2-0 CT1 TAPERPNT 27 (SUTURE) ×2 IMPLANT
TOWEL GREEN STERILE (TOWEL DISPOSABLE) ×2 IMPLANT
TOWEL GREEN STERILE FF (TOWEL DISPOSABLE) ×2 IMPLANT
TRAY FOLEY MTR SLVR 16FR STAT (SET/KITS/TRAYS/PACK) IMPLANT
WATER STERILE IRR 1000ML POUR (IV SOLUTION) ×2 IMPLANT

## 2022-04-21 NOTE — Progress Notes (Addendum)
Patient admitted after midnight, please see H&P.  Here with hip fracture, plan for the OR on 2/7.  Still flu + from 1/22-- no symptoms  Eulogio Bear DO

## 2022-04-21 NOTE — Progress Notes (Signed)
Initial Nutrition Assessment  DOCUMENTATION CODES:  Severe malnutrition in context of chronic illness, Underweight  INTERVENTION:  Once diet resumes, recommend: Regular diet Ensure Enlive po TID, each supplement provides 350 kcal and 20 grams of protein. Snacks TID between meals MVI BID with minerals daily Calcium carbonate 500mg  TID Consider addition of creon with snacks and Ensure to aid in absorption of PO intake; discussed with MD  NUTRITION DIAGNOSIS:  Severe Malnutrition related to chronic illness (gastric bypass, malabsorption, chronic pancreatitis) as evidenced by severe fat depletion, severe muscle depletion.  GOAL:  Patient will meet greater than or equal to 90% of their needs  MONITOR:  Diet advancement, Labs, Weight trends, Skin  REASON FOR ASSESSMENT:  Consult Hip fracture protocol  ASSESSMENT:  Pt admitted d/t mechanical fall leading to L tibial fracture. PMH significant for prior EtOH use, chronic alcoholic pancreatitis with malabsorption, chronic gastric ulcer, hepatic cirrhosis with portal hypertensive gastropathy, anemia of chronic disease, hypothyroidism, protein and nutritional deficiencies.  Pt recently admitted 03/13/22-04/12/22 with severe sepsis d/t cellulitis and desquamative dermatitis. S/p celiac block 01/23.   02/07: s/p L tibial IMN  Pt husband at bedside assisting to provide detailed nutrition related history. Pt was initiated on TPN to meet nutrition needs during prior admission d/t history of gastric bypass and other gastric surgeries, concerning for unsuccessful NGT placement. She recalls eating really well while on diet during prior admission. She continued to eat well up until Monday. She typically eats multiple times per day. He keeps Ensure stocked for her and she typically tries to consume 3 per day. She also consumes snacks daily including chips and dip and various fruits.   Pt's current weight of 42 kg, appears to have remained stable from last  admission. Otherwise, there is limited documentation of weight history on file to review within the last year. Will continue to monitor throughout admission.   Medications: Vitamin D3 1000 units daily, decadron, doxycycline, pepcid, diflucan, lasix, creon TID with meals, melatonin, MVI, protonix, Vitamin B6, florastor, selenium, Vitamin A caps q48h, Vitamin B12, Vitamin E 400 units daily, zinc 220mg  daily  Labs: sodium 133, BUN 35, ALT 46  NUTRITION - FOCUSED PHYSICAL EXAM:  Flowsheet Row Most Recent Value  Orbital Region Severe depletion  Upper Arm Region Severe depletion  Thoracic and Lumbar Region Severe depletion  Buccal Region Severe depletion  Temple Region Severe depletion  Clavicle Bone Region Severe depletion  Clavicle and Acromion Bone Region Severe depletion  Scapular Bone Region Severe depletion  Dorsal Hand Severe depletion  Patellar Region Severe depletion  Anterior Thigh Region Severe depletion  Posterior Calf Region Severe depletion  Edema (RD Assessment) None  Hair Other (Comment)  [thinning]  Mouth Reviewed  Skin Reviewed  Nails Reviewed       Diet Order:   Diet Order             Diet NPO time specified  Diet effective now                   EDUCATION NEEDS:  Education needs have been addressed  Skin:  Skin Integrity Issues:: Stage III Stage III: medial R ankle  Last BM:  unknown  Height:  Ht Readings from Last 1 Encounters:  04/21/22 5\' 8"  (1.727 m)   Weight:  Wt Readings from Last 1 Encounters:  04/21/22 42 kg   BMI:  Body mass index is 14.08 kg/m.  Estimated Nutritional Needs:   Kcal:  1600-1800  Protein:  80-95g  Fluid:  1.6-1.8L  Clayborne Dana, RDN, LDN Clinical Nutrition

## 2022-04-21 NOTE — Anesthesia Preprocedure Evaluation (Addendum)
Anesthesia Evaluation  Patient identified by MRN, date of birth, ID band Patient awake    Reviewed: Allergy & Precautions, NPO status , Patient's Chart, lab work & pertinent test results  History of Anesthesia Complications Negative for: history of anesthetic complications  Airway Mallampati: II  TM Distance: >3 FB Neck ROM: Full    Dental  (+) Dental Advisory Given, Teeth Intact, Missing   Pulmonary pneumonia   Pulmonary exam normal breath sounds clear to auscultation       Cardiovascular +CHF  Normal cardiovascular exam Rhythm:Regular Rate:Normal   '23 TTE - EF 70 to 75%. There is mild left ventricular hypertrophy of the basal-septal segment. Grade I diastolic dysfunction (impaired relaxation). Trivial mitral valve regurgitation. Aortic valve regurgitation is trivial.      Neuro/Psych Seizures -, Well Controlled,   negative psych ROS   GI/Hepatic PUD,GERD  Medicated and Controlled,,(+) Cirrhosis     substance abuse  alcohol use S/p gastric bypass Chronic pancreatitis    Endo/Other  negative endocrine ROS   Ca 7.3   Renal/GU Renal disease     Musculoskeletal negative musculoskeletal ROS (+)   Chronic pain    Abdominal   Peds  Hematology  (+) Blood dyscrasia, anemia  Plt 123k    Anesthesia Other Findings   Reproductive/Obstetrics                             Anesthesia Physical Anesthesia Plan  ASA: 3  Anesthesia Plan: General   Post-op Pain Management: Tylenol PO (pre-op)*, Gabapentin PO (pre-op)* and Celebrex PO (pre-op)*   Induction:   PONV Risk Score and Plan: 2 and Propofol infusion and Treatment may vary due to age or medical condition  Airway Management Planned: LMA  Additional Equipment: None  Intra-op Plan:   Post-operative Plan: Extubation in OR  Informed Consent: I have reviewed the patients History and Physical, chart, labs and discussed the  procedure including the risks, benefits and alternatives for the proposed anesthesia with the patient or authorized representative who has indicated his/her understanding and acceptance.   Patient has DNR.  Discussed DNR with patient and Suspend DNR.   Dental advisory given  Plan Discussed with: CRNA  Anesthesia Plan Comments: (Surgeon requests no block and will localize in OR)       Anesthesia Quick Evaluation

## 2022-04-21 NOTE — H&P (Signed)
History and Physical    Carolyn [name] FTD:[id number] DOB: [birth year] DOA: 04/20/2022  PCP: Pcp, No  Patient coming from: SNF  I have personally briefly reviewed patient's old medical records in [facility]  Chief Complaint: Left leg injury after mechanical fall  HPI: Carolyn [name] is a 67 y.o. female with medical history significant for alcoholism now in remission complicated by chronic alcoholic pancreatitis with malabsorption, chronic gastric ulcer with contained perforation, hepatic cirrhosis with portal hypertensive gastropathy, anemia of chronic disease and folate deficiency, hypothyroidism, protein and nutritional deficiencies who presented to the ED from SNF for evaluation of left fibula and tibia fractures after a mechanical fall.  Patient with recent prolonged admission from 03/13/2022-04/12/2022.  Initially presented with severe sepsis due to cellulitis and desquamative dermatitis.  This was felt secondary to severe protein calorie malnutrition and multiple nutritional deficiencies (selenium, vitamin A, B6, E, folate).  She completed initial 10-day course of antibiotics and was started on nutritional supplements as well as TPN.  She was seen by palliative care for management of uncontrolled pain and placed on a Decadron taper in addition to narcotics.  She underwent celiac block by IR on 04/06/2022.  Hospitalization was complicated by hypoxic respiratory failure due to acute on chronic HFpEF, influenza A, and pneumonia.  She was also noted to have acute on chronic anemia requiring 3 unit PRBC transfusion.  She was seen by GI who recommended against further endoscopic evaluation at that time.  Patient was discharged to SNF.  Patient has been ambulating with the use of a walker.  Husband states that she has been able to walk up to 300 feet fairly well.  Her desquamative rashes have healed well.  She does have contact pressure sores involving her medial ankles and knees for which she was  seen by wound care and required debridement yesterday.  She was apparently started on Augmentin and doxycycline due to concern for associated infection.  Yesterday husband was helping transport patient from the common dining area in her wheelchair to the bathroom.  When she was standing up she felt pain at her left lower leg above the ankle when she was bearing weight.  Her legs then gave out and she fell to the ground.  She has been unable to stand afterwards.  An x-ray was obtained at her facility which showed an acute nondisplaced fracture of the left fibular head and distal shaft of tibia.  She was sent to the ED for further evaluation.  Of note, patient has been on a steroid taper since discharge from hospital, initially Decadron 4 mg daily for 1 week.  Currently on 2 mg daily for 1 week with plan to change to every other day for 1 week then stop.  ED Course  Labs/Imaging on admission: I have personally reviewed following labs and imaging studies.  Initial vitals showed BP 104/67, pulse 85, RR 20, temp 97.8 F, SpO2 99% on room air.  Labs show WBC 19.6, hemoglobin 8.4, platelets 178,000, sodium 132, potassium 3.9, bicarb 25, BUN 42, creatinine 0.93, serum glucose 76.  Respiratory panel and urinalysis pending collection.  Left tibia/fibula, knee, and ankle x-rays show mildly displaced fracture of the left fibular head and neck.  Comminuted displaced fracture of the distal tibial shaft.  No fracture or dislocation of the left ankle.  Patient was given IV Dilaudid 0.5 mg.  EDP discussed with on-call orthopedics, Dr. Ophelia Charter, who recommended medical admission to [facility] with tentative plan for surgical  repair on 2/7.  The hospitalist service was consulted to admit for further evaluation and management.  Review of Systems: All systems reviewed and are negative except as documented in history of present illness above.   Past Medical History:  Diagnosis Date   Alcoholic cirrhosis  ([place])    Chronic pancreatitis ([place])    Endometriosis    GERD (gastroesophageal reflux disease)    Liver disease    Malabsorption    MALABSORPTION SYNDROME   Osteoporosis 03/2011   t score -2.5   Pancreatitis    due to cyst and tumors due to calcifications   Seizure ([place])    no seizure disorder   Vitamin D deficiency 2012   VIT D 15    Past Surgical History:  Procedure Laterality Date   APPENDECTOMY     BIOPSY  01/19/2022   Procedure: BIOPSY;  Surgeon: Lavena Bullion, DO;  Location: [facility];  Service: Gastroenterology;;   CHOLECYSTECTOMY     ESOPHAGOGASTRODUODENOSCOPY (EGD) WITH PROPOFOL N/A 01/19/2022   Procedure: ESOPHAGOGASTRODUODENOSCOPY (EGD) WITH PROPOFOL;  Surgeon: Lavena Bullion, DO;  Location: [facility];  Service: Gastroenterology;  Laterality: N/A;   FEEDING TUBE RELOCATION  2010   JEJUNOSTOMY FEEDING TUBE  1990   MULTIPLE GASTRIC SURGERIES     MYOMECTOMY     OPEN REDUCTION INTERNAL FIXATION (ORIF) DISTAL RADIAL FRACTURE Left 06/29/2018   Procedure: OPEN REDUCTION INTERNAL FIXATION (ORIF)LEFT  DISTAL RADIAL FRACTURE;  Surgeon: Leanora Cover, MD;  Location: [facility];  Service: Orthopedics;  Laterality: Left;   ROUX-EN-Y GASTRIC BYPASS     TUBAL LIGATION      Social History:  reports that she has never smoked. She has never used smokeless tobacco. She reports current alcohol use. She reports that she does not use drugs.  Allergies  Allergen Reactions   Peanut-Containing Drug Products Anaphylaxis    Tree nuts    Family History  Problem Relation Age of Onset   Cancer Father        BONE   Breast cancer Maternal Grandmother      Prior to Admission medications   Medication Sig Start Date End Date Taking? Authorizing Provider  amoxicillin-clavulanate (AUGMENTIN) 875-125 MG tablet Take 1 tablet by mouth 2 (two) times daily.   Yes [provider]  cholecalciferol (CHOLECALCIFEROL) 25 MCG tablet Take 1 tablet (1,000 Units  total) by mouth daily. 04/13/22  Yes Georgette Shell, MD  dexamethasone (DECADRON) 4 MG tablet Decadron 4 mg daily for 1 week Then 2 mg daily for 1 week Then 2 mg every other day for 1 week and then stop Patient taking differently: Take 2 mg by mouth daily. 04/13/22  Yes Georgette Shell, MD  doxycycline (DORYX) 100 MG EC tablet Take 100 mg by mouth 2 (two) times daily.   Yes [provider]  famotidine (PEPCID) 10 MG tablet Take 10 mg by mouth 2 (two) times daily.   Yes [provider]  feeding supplement (ENSURE ENLIVE / ENSURE PLUS) LIQD Take 237 mLs by mouth 3 (three) times daily between meals. 04/12/22  Yes Georgette Shell, MD  fentaNYL (DURAGESIC) 50 MCG/HR Place 1 patch onto the skin every 3 (three) days. 04/13/22  Yes Georgette Shell, MD  fluconazole (DIFLUCAN) 200 MG tablet Take 1 tablet (200 mg total) by mouth daily. 04/13/22  Yes Georgette Shell, MD  furosemide (LASIX) 40 MG tablet TAKE 1 TABLET BY MOUTH EVERY DAY 03/23/22  Yes Thornton Park, MD  gabapentin (  NEURONTIN) 300 MG capsule Take 1 capsule (300 mg total) by mouth 2 (two) times daily. 04/12/22  Yes Georgette Shell, MD  HYDROmorphone (DILAUDID) 2 MG tablet Take 1 tablet (2 mg total) by mouth every 4 (four) hours as needed for moderate pain ([facility]). 04/12/22  Yes Georgette Shell, MD  insulin aspart (NOVOLOG) 100 UNIT/ML injection Inject 3 Units into the skin 3 (three) times daily with meals. 04/12/22  Yes Georgette Shell, MD  insulin glargine-yfgn [facility]) 100 UNIT/ML injection Inject 0.15 mLs (15 Units total) into the skin daily. 04/13/22  Yes Georgette Shell, MD  levothyroxine (SYNTHROID) 50 MCG tablet Take 1 tablet (50 mcg total) by mouth daily at 6 (six) AM. 04/13/22  Yes Georgette Shell, MD  liothyronine (CYTOMEL) 25 MCG tablet Take 1 tablet (25 mcg total) by mouth daily. 04/13/22  Yes Georgette Shell, MD  lipase/protease/amylase (CREON) 12000-38000  units CPEP capsule Take 2 capsules (24,000 Units total) by mouth 3 (three) times daily with meals. 02/11/22  Yes Esterwood, Amy S, PA-C  loperamide (IMODIUM) 2 MG capsule Take 1 capsule (2 mg total) by mouth every 6 (six) hours as needed for diarrhea or loose stools. 04/12/22  Yes Georgette Shell, MD  Melatonin 5 MG CAPS Take 1 capsule by mouth at bedtime.   Yes [provider]  metoprolol tartrate (LOPRESSOR) 25 MG tablet Take 1 tablet (25 mg total) by mouth 2 (two) times daily. 04/12/22  Yes Georgette Shell, MD  Multiple Vitamin (MULITIVITAMIN WITH MINERALS) TABS Take 1 tablet by mouth daily.   Yes [provider]  Nutritional Supplements (,FEEDING SUPPLEMENT, PROSOURCE PLUS) liquid Take 30 mLs by mouth 2 (two) times daily between meals. 04/12/22  Yes Georgette Shell, MD  ondansetron (ZOFRAN) 4 MG tablet Take 4 mg by mouth every 8 (eight) hours as needed for nausea or vomiting.   Yes [provider]  pantoprazole (PROTONIX) 40 MG tablet Take 1 tablet (40 mg total) by mouth 2 (two) times daily before a meal. 02/11/22  Yes Esterwood, Amy S, PA-C  phenobarbital (LUMINAL) 16.2 MG tablet Take 1 tablet (16.2 mg total) by mouth at bedtime. 04/12/22  Yes Georgette Shell, MD  polyethylene glycol (MIRALAX / GLYCOLAX) 17 g packet Take 17 g by mouth 2 (two) times daily as needed for mild constipation. 01/25/22  Yes Alma Friendly, MD  pyridOXINE (B-6) 100 MG tablet Take 1 tablet (100 mg total) by mouth daily. 04/13/22  Yes Georgette Shell, MD  saccharomyces boulardii (FLORASTOR) 250 MG capsule Take 1 capsule (250 mg total) by mouth 2 (two) times daily. 04/12/22  Yes Georgette Shell, MD  selenium 100 MCG TABS Take 1 tablet (100 mcg total) by mouth daily. 04/13/22  Yes Georgette Shell, MD  silver sulfADIAZINE (SILVADENE) 1 % cream Apply topically daily. Patient taking differently: Apply 1 Application topically daily. 04/13/22  Yes Georgette Shell, MD   tiZANidine (ZANAFLEX) 2 MG tablet Take 2 mg by mouth every 12 (twelve) hours as needed for muscle spasms.   Yes [provider]  vitamin A 3 MG (10000 UNITS) capsule Take 1 capsule (10,000 Units total) by mouth every other day. 04/13/22  Yes Georgette Shell, MD  vitamin B-12 (CYANOCOBALAMIN) 100 MCG tablet Take 100 mcg by mouth daily.   Yes [provider]  vitamin E 180 MG (400 UNITS) capsule Take 1 capsule (400 Units total) by mouth daily. 04/13/22  Yes Georgette Shell,  MD  zinc sulfate 220 (50 Zn) MG capsule Take 1 capsule (220 mg total) by mouth daily. 01/09/22  Yes Mercy Riding, MD  acetaminophen (TYLENOL) 325 MG tablet Take 2 tablets (650 mg total) by mouth every 4 (four) hours as needed for mild pain (temp > 101.5). 04/12/22   Georgette Shell, MD  potassium chloride SA (KLOR-CON M) 20 MEQ tablet Take 1 tablet (20 mEq total) by mouth daily for 2 days. 04/12/22 04/14/22  Georgette Shell, MD  PROMETHAZINE HCL PO Take 4 mg by mouth.   06/06/11  [provider]  Ranitidine HCl (ZANTAC PO) Take by mouth.    06/06/11  [provider]    Physical Exam: Vitals:   04/20/22 2135 04/21/22 0122 04/21/22 0125 04/21/22 0125  BP: 104/67     Pulse: 85     Resp: 20     Temp: 97.8 F (36.6 C) 98.2 F (36.8 C)    TempSrc: Oral Oral    SpO2: 99%  99%   Weight:    42 kg  Height:    5\' 8"  (1.727 m)   Constitutional: Chronically ill-appearing malnutrition woman resting in bed, appears older than stated age.  Appears fatigued. Eyes: EOMI, lids and conjunctivae normal ENMT: Mucous membranes are dry. Posterior pharynx clear of any exudate or lesions.Normal dentition.  Neck: normal, supple, no masses. Respiratory: clear to auscultation bilaterally, no wheezing, no crackles. Normal respiratory effort. No accessory muscle use.  Cardiovascular: Regular rate and rhythm, no murmurs / rubs / gallops. No extremity edema. 2+ pedal pulses. Abdomen: no tenderness,  no masses palpated. Musculoskeletal: Thin extremities with muscle wasting throughout.  Swelling and erythema distal left lower extremity above the ankle. Skin: Swelling/erythema distal left lower extremity above the ankle.  Medial contact pressure sores both ankles, right knee.  Previous desquamative dermatitis rash on the hands and feet have healed well. Neurologic: Sensation intact. Strength diminished left lower extremity due to fractures otherwise equal bilaterally Psychiatric: Normal judgment and insight. Alert and oriented x 3. Normal mood.   EKG: Ordered and pending.  Assessment/Plan Principal Problem:   Displaced comminuted fracture of shaft of left tibia, initial encounter for closed fracture Active Problems:   Closed fracture of proximal end of left fibula, initial encounter   Chronic pain syndrome   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HCC)   Alcoholic cirrhosis of liver without ascites (HCC)   Chronic gastric ulcer with perforation (HCC)   Anemia of chronic disease   Leukocytosis   Carolyn [name] is a 67 y.o. female with medical history significant for alcoholism now in remission complicated by chronic alcoholic pancreatitis with malabsorption, chronic gastric ulcer with contained perforation, hepatic cirrhosis with portal hypertensive gastropathy, anemia of chronic disease and folate deficiency, hypothyroidism, protein and nutritional deficiencies who is admitted with acute fibular neck and comminuted distal tibial shaft fractures.  Assessment and Plan: Comminuted displaced fracture of distal tibial shaft Displaced fracture of the left fibular head and neck: Orthopedics consulted and recommend admission to [facility] for anticipated surgical repair. -Admit to [facility] n[facility]p.o. -Continue analgesics as needed -Gentle IV fluid hydration overnight  Anemia of chronic disease: Hemoglobin is 8.4.  No obvious bleeding.  Monitor closely.  Leukocytosis: Likely reactive in  setting of acute fractures.  Also was started on Augmentin and doxycycline for presumed pressure sore wound infection at her facility. -Continue Augmentin and doxycycline  Chronic alcoholic pancreatitis with malabsorption: Continue Creon.  Chronic gastric ulcer with contained perforation: Continue  Protonix.  Hepatic cirrhosis with portal hypertensive gastropathy: Appears compensated without ascites.  Hypothyroidism: Continue Synthroid.  HFpEF: No sign of volume overload, she is malnourished with muscle wasting throughout. -Hold Lasix and Lopressor for now.  Protein calorie malnutrition/nutritional deficiencies: -Consult to dietitian -Continue feeding supplement -Continue B6, selenium, vitamin A, B12, vitamin D3, vitamin E, zinc supplements  Contact pressure sores: Consult to wound care.  Continue Augmentin and doxycycline.  Chronic pain: Continue Dilaudid 2 mg q4h prn, fentanyl patch, gabapentin.  IV morphine prn for severe pain in setting of fractures.  Anxiety: Continue Cytomel and phenobarbital.   DVT prophylaxis: SCDs Start: 04/21/22 0109 Code Status: DNR, confirmed with patient on admission Family Communication: Husband at bedside Disposition Plan: From SNF and likely discharge to SNF pending clinical progress Consults called: Orthopedics, Dr. Lorin Mercy Severity of Illness: The appropriate patient status for this patient is INPATIENT. Inpatient status is judged to be reasonable and necessary in order to provide the required intensity of service to ensure the patient's safety. The patient's presenting symptoms, physical exam findings, and initial radiographic and laboratory data in the context of their chronic comorbidities is felt to place them at high risk for further clinical deterioration. Furthermore, it is not anticipated that the patient will be medically stable for discharge from the hospital within 2 midnights of admission.   * I certify that at the point of  admission it is my clinical judgment that the patient will require inpatient hospital care spanning beyond 2 midnights from the point of admission due to high intensity of service, high risk for further deterioration and high frequency of surveillance required.Zada Finders MD [facility]  If 7PM-7AM, please contact night-coverage [email or web address]  04/21/2022, 1:33 AM

## 2022-04-21 NOTE — Op Note (Signed)
Pre and postop diagnosis: Left closed tib-fib fracture  Procedure left tibial nail with proximal interlock.  Surgeon: Rodell Perna, MD  Anesthesia: General LMA.  Tourniquet :time 34 minutes x 200 pressure.  Implants:mplants  NAIL TIB IM 9.3X320 - [id number]  Inventory Item: NAIL TIB IM 9.3X320 Serial no.: Model/Cat no.: [id number]  Implant name: NAIL TIB IM 9.3X320 - [id number] Laterality: Left Area: Tibia  Manufacturer: [employer](ORTH,TRAU,BIO,SG) Date of Manufacture:   Action: Implanted Number Used: 1   Device Identifier: Device Identifier Type:   SCREW CORT [id number] T5 - [id number]  Inventory Item: SCREW CORT 1P3X902 T5 Serial no.: Model/Cat no.: [id number]  Implant name: SCREW CORT [id number] - [id number] Laterality: Left Area: Tibia  Manufacturer: [employer](ORTH,TRAU,BIO,SG) Date of Manufacture:   Action: Implanted Number Used: 1   Device Identifier: Device Identifier Type:   SCREW CANC FEM FA 5X30 - [id number]  Inventory Item: SCREW CANC FEM FA 5X30 Serial no.: Model/Cat no.: [id number]  Implant nameNelda Bucks FEM FA [id number] - [id number] Laterality: Left Area: Tibia  Manufacturer: [employer]ON(ORTH,TRAU,BIO,SG) Date of Manufacture:   Action: Implanted Number Used: 1   Device Identifier: Device Identifier Type:    Procedure After induction of anesthesia with LMA tube placement preoperative Ancef prophylaxis proximal tourniquet was applied over the thigh and then the short leg splint was removed.  Patient had a dressing that look like OpSite with an area covered which was a small ulcer near the medial malleolus not extending all the way down to the bone.  On the opposite knee patient had full-thickness ulcer 1 cm round over the medial femoral condyle.  There were no fracture blisters.  Patient had long history of pancreatitis and malnourishment.  There is erythema directly over the distal aspect of the medial tibia where interlock screws will be placed from medial to  lateral.  After standard prepping and draping timeout procedure leg was elevated tourniquet inflated.  Incision was made adjacent to the medial edge of the patellar tendon.  Proximal portion of the tibia was exposed were beveled and entry site was made checked under fluoroscopy reamed and then the beaded tip rod was placed all the way down to the ankle.  With the patient's poor skin quality history of malnourishment already having ulcers medial I was concerned that with incisions made for distal interlock that patient will had skin breakdown with potential for infection.  Patient's fracture in the tibia was oblique 9 comminuted.  1 option to consider was possibly putting a single anterior to posterior interlock screw but that still was an area where the patient had some mild induration and swelling.  The tip rod was placed down to the old growth plate at the ankle and sequential reaming up to 10 then placement of the 9.3 mm rod which went down nicely down to the old growth plate.  This allowed several centimeters countersink of the nail and 2 interlock screws were placed 1 anterolateral posterior medial and the other from medial to lateral transverse.  Leg was checked and there was good stability and no rotation to the obliquity of the fracture.  Patient's fibular fracture was proximally at the head and neck region.  Copious irrigation final spot pictures were taken AP and lateral.  Proximal 2 screws had bicortical fixation.  0 Vicryl 2-0 Vicryl skin stapler and a dressing Mepilex was placed over the medial distal ankle where patient had some skin problems.  Plan will be to keep patient in a boot and Xeroform 4  x 4's ABD web roll Ace wrap and small cam boot was applied.  Patient will be weightbearing as tolerated with walker.  Prior to her fall patient been in a facility and was trying to do therapy to improve her strength and improve her nutrition with her severe malnutrition , history of alcoholism and  pancreatitis.  Patient tolerated procedure well transferred recovery in stable condition.

## 2022-04-21 NOTE — Progress Notes (Signed)
Orthopedic Tech Progress Note Patient Details:  Carolyn [name] Dec 15, 1955 [id number]  Ortho Devices Type of Ortho Device: Short arm splint, Stirrup splint Ortho Device/Splint Location: LLE Ortho Device/Splint Interventions: Ordered, Application, Adjustment  Pt was in extreme pain when trying to straighten her leg for posterior application did it to the best of my ability.    Post Interventions Patient Tolerated: Poor  [name] [name] 04/21/2022, 2:50 AM

## 2022-04-21 NOTE — Transfer of Care (Signed)
Immediate Anesthesia Transfer of Care Note  Patient: Carolyn [name]  Procedure(s) Performed: LEFT INTRAMEDULLARY (IM) NAIL TIBIAL (Left)  Patient Location: PACU  Anesthesia Type:General  Level of Consciousness: awake, alert , and oriented  Airway & Oxygen Therapy: Patient Spontanous Breathing and Patient connected to nasal cannula oxygen  Post-op Assessment: Report given to RN and Post -op Vital signs reviewed and stable  Post vital signs: Reviewed and stable  Last Vitals:  Vitals Value Taken Time  BP 140/87 04/21/22 1603  Temp    Pulse 92 04/21/22 1604  Resp 18 04/21/22 1604  SpO2 100 % 04/21/22 1604  Vitals shown include unvalidated device data.  Last Pain:  Vitals:   04/21/22 1307  TempSrc: Oral  PainSc: 8       Patients Stated Pain Goal: 0 (81/77/11 6579)  Complications: No notable events documented.

## 2022-04-21 NOTE — Progress Notes (Signed)
Orthopedic Tech Progress Note Patient Details:  Carolyn [name] 1955/07/22 [id number] Small cam walker was delivered to the OR desk  Ortho Devices Type of Ortho Device: CAM walker Ortho Device/Splint Location: LLE Ortho Device/Splint Interventions: Ordered   Post Interventions Patient Tolerated: Poor  Carolyn [name] Carolyn [name] 04/21/2022, 3:46 PM

## 2022-04-21 NOTE — ED Notes (Signed)
[facility] arrival.

## 2022-04-21 NOTE — ED Notes (Signed)
[facility] departure

## 2022-04-21 NOTE — ED Notes (Signed)
Paged ortho for application of splint.

## 2022-04-21 NOTE — Anesthesia Postprocedure Evaluation (Signed)
Anesthesia Post Note  Patient: Carolyn [name]  Procedure(s) Performed: LEFT INTRAMEDULLARY (IM) NAIL TIBIAL (Left)     Patient location during evaluation: PACU Anesthesia Type: General Level of consciousness: sedated and patient cooperative Pain management: pain level controlled Vital Signs Assessment: post-procedure vital signs reviewed and stable Respiratory status: spontaneous breathing Cardiovascular status: stable Anesthetic complications: no   No notable events documented.  Last Vitals:  Vitals:   04/21/22 1700 04/21/22 1716  BP: 134/85 (!) 147/83  Pulse: 84 95  Resp: 16   Temp: 36.8 C   SpO2: 97% 97%    Last Pain:  Vitals:   04/21/22 1700  TempSrc:   PainSc: [facility]

## 2022-04-21 NOTE — Consult Note (Signed)
Reason for Consult:fall with left closed tib fib fracture. Tibial shaft Referring Physician: Marlin Canary DO  SHALEAH NISSLEY is an 67 y.o. female.  HPI: 67 year old female with history of alcoholism with chronic pancreatitis malabsorption cirrhosis chronically staying at [facility] with recent flu with still some persistent symptoms and mechanical fall with a left tibial shaft fracture and proximal fibular fracture.  This was a closed injury she is unable to walk pain was severe.  She had history of severe protein malnourishment with recent admission which is improved.  Patient had albumin of 3.2 total protein 6.0 with normal bilirubin alkaline phosphatase and mild elevation in ALT at 46 with normal being 0-44.  Recent mission patient was transfused and current hemoglobin is 9.2.  Elevated white count 15.6 thousand.  Patient was seen in the emergency room placed in splint.  Past Medical History:  Diagnosis Date   Alcoholic cirrhosis (HCC)    Chronic pancreatitis (HCC)    Endometriosis    GERD (gastroesophageal reflux disease)    Liver disease    Malabsorption    MALABSORPTION SYNDROME   Osteoporosis 03/2011   t score -2.5   Pancreatitis    due to cyst and tumors due to calcifications   Seizure (HCC)    no seizure disorder   Vitamin D deficiency 2012   VIT D 15    Past Surgical History:  Procedure Laterality Date   APPENDECTOMY     BIOPSY  01/19/2022   Procedure: BIOPSY;  Surgeon: Shellia Cleverly, DO;  Location: [facility];  Service: Gastroenterology;;   CHOLECYSTECTOMY     ESOPHAGOGASTRODUODENOSCOPY (EGD) WITH PROPOFOL N/A 01/19/2022   Procedure: ESOPHAGOGASTRODUODENOSCOPY (EGD) WITH PROPOFOL;  Surgeon: Shellia Cleverly, DO;  Location: [facility];  Service: Gastroenterology;  Laterality: N/A;   FEEDING TUBE RELOCATION  2010   JEJUNOSTOMY FEEDING TUBE  1990   MULTIPLE GASTRIC SURGERIES     MYOMECTOMY     OPEN REDUCTION INTERNAL FIXATION (ORIF) DISTAL RADIAL FRACTURE  Left 06/29/2018   Procedure: OPEN REDUCTION INTERNAL FIXATION (ORIF)LEFT  DISTAL RADIAL FRACTURE;  Surgeon: Betha Loa, MD;  Location: [facility];  Service: Orthopedics;  Laterality: Left;   ROUX-EN-Y GASTRIC BYPASS     TUBAL LIGATION      Family History  Problem Relation Age of Onset   Cancer Father        BONE   Breast cancer Maternal Grandmother     Social History:  reports that she has never smoked. She has never used smokeless tobacco. She reports current alcohol use. She reports that she does not use drugs.  Allergies:  Allergies  Allergen Reactions   Peanut-Containing Drug Products Anaphylaxis    Tree nuts    Medications: I have reviewed the patient's current medications.  Results for orders placed or performed during the hospital encounter of 04/20/22 (from the past 48 hour(s))  CBC with Differential     Status: Abnormal   Collection Time: 04/20/22 11:00 PM  Result Value Ref Range   WBC 19.6 (H) 4.0 - 10.5 K/uL   RBC 2.71 (L) 3.87 - 5.11 MIL/uL   Hemoglobin 8.4 (L) 12.0 - 15.0 g/dL   HCT 42.5 (L) 95.6 - 38.7 %   MCV 95.6 80.0 - 100.0 fL   MCH 31.0 26.0 - 34.0 pg   MCHC 32.4 30.0 - 36.0 g/dL   RDW 56.4 (H) 33.2 - 95.1 %   Platelets 178 150 - 400 K/uL   nRBC 0.0 0.0 -  0.2 %   Neutrophils Relative % 73 %   Neutro Abs 14.3 (H) 1.7 - 7.7 K/uL   Lymphocytes Relative 15 %   Lymphs Abs 3.0 0.7 - 4.0 K/uL   Monocytes Relative 8 %   Monocytes Absolute 1.6 (H) 0.1 - 1.0 K/uL   Eosinophils Relative 0 %   Eosinophils Absolute 0.0 0.0 - 0.5 K/uL   Basophils Relative 0 %   Basophils Absolute 0.1 0.0 - 0.1 K/uL   Immature Granulocytes 4 %   Abs Immature Granulocytes 0.68 (H) 0.00 - 0.07 K/uL    Comment: Performed at [facility], [place] [address] [place], [place] [address]  Basic metabolic panel     Status: Abnormal   Collection Time: 04/20/22 11:00 PM  Result Value Ref Range   Sodium 132 (L) 135 - 145 mmol/L   Potassium 3.9 3.5 - 5.1  mmol/L   Chloride 100 98 - 111 mmol/L   CO2 25 22 - 32 mmol/L   Glucose, Bld 76 70 - 99 mg/dL    Comment: Glucose reference range applies only to samples taken after fasting for at least 8 hours.   BUN 42 (H) 8 - 23 mg/dL   Creatinine, Ser 0.93 0.44 - 1.00 mg/dL   Calcium 8.5 (L) 8.9 - 10.3 mg/dL   GFR, Estimated >60 >60 mL/min    Comment: (NOTE) Calculated using the CKD-EPI Creatinine Equation (2021)    Anion gap 7 5 - 15    Comment: Performed at [facility], [place] [address] [place], [place] [address]  Resp panel by RT-PCR (RSV, Flu A&B, Covid) Anterior Nasal Swab     Status: Abnormal   Collection Time: 04/21/22  1:07 AM   Specimen: Anterior Nasal Swab  Result Value Ref Range   SARS Coronavirus 2 by RT PCR NEGATIVE NEGATIVE    Comment: (NOTE) SARS-CoV-2 target nucleic acids are NOT DETECTED.  The SARS-CoV-2 RNA is generally detectable in upper respiratory specimens during the acute phase of infection. The lowest concentration of SARS-CoV-2 viral copies this assay can detect is 138 copies/mL. A negative result does not preclude SARS-Cov-2 infection and should not be used as the sole basis for treatment or other patient management decisions. A negative result may occur with  improper specimen collection/handling, submission of specimen other than nasopharyngeal swab, presence of viral mutation(s) within the areas targeted by this assay, and inadequate number of viral copies(<138 copies/mL). A negative result must be combined with clinical observations, patient history, and epidemiological information. The expected result is Negative.  Fact Sheet for Patients:  EntrepreneurPulse.com.au  Fact Sheet for Healthcare Providers:  IncredibleEmployment.be  This test is no t yet approved or cleared by the [place] FDA and  has been authorized for detection and/or diagnosis of SARS-CoV-2 by FDA under an Emergency Use  Authorization (EUA). This EUA will remain  in effect (meaning this test can be used) for the duration of the COVID-19 declaration under Section 564(b)(1) of the Act, 21 U.S.C.section 360bbb-3(b)(1), unless the authorization is terminated  or revoked sooner.       Influenza A by PCR POSITIVE (A) NEGATIVE   Influenza B by PCR NEGATIVE NEGATIVE    Comment: (NOTE) The Xpert Xpress SARS-CoV-2/FLU/RSV plus assay is intended as an aid in the diagnosis of influenza from Nasopharyngeal swab specimens and should not be used as a sole basis for treatment. Nasal washings and aspirates are unacceptable for Xpert Xpress SARS-CoV-2/FLU/RSV testing.  Fact Sheet for Patients: EntrepreneurPulse.com.au  Fact Sheet for Healthcare Providers: SeriousBroker.it  This test is not yet approved or cleared by the [place] FDA and has been authorized for detection and/or diagnosis of SARS-CoV-2 by FDA under an Emergency Use Authorization (EUA). This EUA will remain in effect (meaning this test can be used) for the duration of the COVID-19 declaration under Section 564(b)(1) of the Act, 21 U.S.C. section 360bbb-3(b)(1), unless the authorization is terminated or revoked.     Resp Syncytial Virus by PCR NEGATIVE NEGATIVE    Comment: (NOTE) Fact Sheet for Patients: BloggerCourse.com  Fact Sheet for Healthcare Providers: SeriousBroker.it  This test is not yet approved or cleared by the [place] FDA and has been authorized for detection and/or diagnosis of SARS-CoV-2 by FDA under an Emergency Use Authorization (EUA). This EUA will remain in effect (meaning this test can be used) for the duration of the COVID-19 declaration under Section 564(b)(1) of the Act, 21 U.S.C. section 360bbb-3(b)(1), unless the authorization is terminated or revoked.  Performed at [facility], [address] [place], [place] [address]   Type and screen [facility]     Status: None   Collection Time: 04/21/22  2:20 AM  Result Value Ref Range   ABO/RH(D) B POS    Antibody Screen NEG    Sample Expiration      04/24/2022,2359 Performed at [facility], [address], [place], [place] [address]   Type and screen [facility]     Status: None   Collection Time: 04/21/22  4:38 AM  Result Value Ref Range   ABO/RH(D) B POS    Antibody Screen NEG    Sample Expiration      04/24/2022,2359 Performed at [facility], [address], [place], [place] [address]   Comprehensive metabolic panel     Status: Abnormal   Collection Time: 04/21/22  4:48 AM  Result Value Ref Range   Sodium 133 (L) 135 - 145 mmol/L   Potassium 3.7 3.5 - 5.1 mmol/L   Chloride 99 98 - 111 mmol/L   CO2 26 22 - 32 mmol/L   Glucose, Bld 73 70 - 99 mg/dL    Comment: Glucose reference range applies only to samples taken after fasting for at least 8 hours.   BUN 35 (H) 8 - 23 mg/dL   Creatinine, Ser 4.74 0.44 - 1.00 mg/dL   Calcium 8.8 (L) 8.9 - 10.3 mg/dL   Total Protein 6.0 (L) 6.5 - 8.1 g/dL   Albumin 3.2 (L) 3.5 - 5.0 g/dL   AST 35 15 - 41 U/L   ALT 46 (H) 0 - 44 U/L   Alkaline Phosphatase 91 38 - 126 U/L   Total Bilirubin 0.5 0.3 - 1.2 mg/dL   GFR, Estimated >25 >95 mL/min    Comment: (NOTE) Calculated using the CKD-EPI Creatinine Equation (2021)    Anion gap 8 5 - 15    Comment: Performed at [facility], [address] [place], [place] [address]  CBC     Status: Abnormal   Collection Time: 04/21/22  4:48 AM  Result Value Ref Range   WBC 15.6 (H) 4.0 - 10.5 K/uL   RBC 2.87 (L) 3.87 - 5.11 MIL/uL   Hemoglobin 9.2 (L) 12.0 - 15.0 g/dL   HCT 64.3 (L) 32.9 - 51.8 %   MCV 94.4 80.0 - 100.0 fL   MCH 32.1 26.0 - 34.0 pg   MCHC 33.9 30.0 -  36.0 g/dL   RDW 15.3 11.5 - 15.5 %   Platelets 182 150 - 400 K/uL   nRBC 0.0 0.0 - 0.2 %    Comment:  Performed at [facility], [place] [address], [place], [place] [address]  MRSA Next Gen by PCR, Nasal     Status: None   Collection Time: 04/21/22  5:00 AM   Specimen: Nasal Mucosa; Nasal Swab  Result Value Ref Range   MRSA by PCR Next Gen NOT DETECTED NOT DETECTED    Comment: (NOTE) The GeneXpert MRSA Assay (FDA approved for NASAL specimens only), is one component of a comprehensive MRSA colonization surveillance program. It is not intended to diagnose MRSA infection nor to guide or monitor treatment for MRSA infections. Test performance is not FDA approved in patients less than 38 years old. Performed at [facility], [place] [address] [place], [place] [address]     DG Tibia/Fibula Left  Result Date: 04/20/2022 CLINICAL DATA:  Fracture.  Fall yesterday at facility. EXAM: LEFT KNEE - 1-2 VIEW; LEFT TIBIA AND FIBULA - 2 VIEW; LEFT ANKLE - 2 VIEW COMPARISON:  None Available. FINDINGS: Left knee/tibia/fibula: There is mildly displaced fracture of the left fibular head and neck. No appreciable fracture of the distal femur, patella tibia. No appreciable joint effusion. There is comminuted displaced fracture of the distal tibial shaft with a proximally 1 cortex width lateral displacement of the distal fracture fragment. Soft tissue swelling about the distal leg. Left ankle: No fracture or dislocation no significant soft tissue findings. IMPRESSION: 1. Mildly displaced fracture of the left fibular head and neck. 2. Comminuted displaced fracture of the distal tibial shaft. 3. No fracture or dislocation of the left ankle. Electronically Signed   By: Keane Police D.O.   On: 04/20/2022 23:39   DG Knee 2 Views Left  Result Date: 04/20/2022 CLINICAL DATA:  Fracture.  Fall yesterday at facility. EXAM: LEFT KNEE - 1-2 VIEW; LEFT TIBIA AND FIBULA - 2 VIEW; LEFT ANKLE - 2 VIEW COMPARISON:  None Available. FINDINGS: Left knee/tibia/fibula: There is mildly displaced fracture of the left fibular head and  neck. No appreciable fracture of the distal femur, patella tibia. No appreciable joint effusion. There is comminuted displaced fracture of the distal tibial shaft with a proximally 1 cortex width lateral displacement of the distal fracture fragment. Soft tissue swelling about the distal leg. Left ankle: No fracture or dislocation no significant soft tissue findings. IMPRESSION: 1. Mildly displaced fracture of the left fibular head and neck. 2. Comminuted displaced fracture of the distal tibial shaft. 3. No fracture or dislocation of the left ankle. Electronically Signed   By: Keane Police D.O.   On: 04/20/2022 23:39   DG Ankle 2 Views Left  Result Date: 04/20/2022 CLINICAL DATA:  Fracture.  Fall yesterday at facility. EXAM: LEFT KNEE - 1-2 VIEW; LEFT TIBIA AND FIBULA - 2 VIEW; LEFT ANKLE - 2 VIEW COMPARISON:  None Available. FINDINGS: Left knee/tibia/fibula: There is mildly displaced fracture of the left fibular head and neck. No appreciable fracture of the distal femur, patella tibia. No appreciable joint effusion. There is comminuted displaced fracture of the distal tibial shaft with a proximally 1 cortex width lateral displacement of the distal fracture fragment. Soft tissue swelling about the distal leg. Left ankle: No fracture or dislocation no significant soft tissue findings. IMPRESSION: 1. Mildly displaced fracture of the left fibular head and neck. 2. Comminuted displaced fracture of the distal tibial shaft. 3.  No fracture or dislocation of the left ankle. Electronically Signed   By: Keane Police D.O.   On: 04/20/2022 23:39    ROS positive for malnourishment alcoholism past history of elevated liver enzymes now improved.  History of protein malnourishment improved.  Recent flu.  No current dyspnea. Blood pressure (!) 150/84, pulse 87, temperature 98.6 F (37 C), temperature source Oral, resp. rate 16, height 5\' 8"  (1.727 m), weight 42 kg, last menstrual period 03/10/2009, SpO2 100 %. Physical  Exam Constitutional:      Comments: Thin malnourished appearance.  HENT:     Head: Normocephalic.     Right Ear: External ear normal.     Left Ear: External ear normal.     Mouth/Throat:     Mouth: Mucous membranes are moist.  Eyes:     Extraocular Movements: Extraocular movements intact.  Cardiovascular:     Rate and Rhythm: Normal rate.  Pulmonary:     Effort: Pulmonary effort is normal. No respiratory distress.     Breath sounds: No wheezing.  Musculoskeletal:     Comments: Patient's short leg splint anterior compartment is soft.  She is able wiggle her toes.  Good capillary refill.  Toes are warm.  Sensation in the toes is intact.  Skin:    General: Skin is warm.     Capillary Refill: Capillary refill takes less than 2 seconds.  Neurological:     General: No focal deficit present.     Mental Status: She is alert.  Psychiatric:        Mood and Affect: Mood normal.     Assessment/Plan: Patient with left closed tib-fib fracture.  Plan intramedullary nail fixation for stabilization of tib-fib fracture with distal third shaft fracture of the tibia.  We discussed risks of surgery.  Anesthesia with her recent respiratory problems will be choice.  Questions were elicited and answered she understands and agrees to proceed.  Marybelle Killings 04/21/2022, 7:37 AM

## 2022-04-21 NOTE — Anesthesia Procedure Notes (Addendum)
Procedure Name: LMA Insertion Date/Time: 04/21/2022 2:46 PM  Performed by: Michele Rockers, CRNAPre-anesthesia Checklist: Patient identified, Emergency Drugs available, Suction available and Patient being monitored Patient Re-evaluated:Patient Re-evaluated prior to induction Oxygen Delivery Method: Circle system utilized Preoxygenation: Pre-oxygenation with 100% oxygen Induction Type: IV induction Ventilation: Mask ventilation without difficulty LMA: LMA flexible inserted LMA Size: 4.0 Number of attempts: 1 Placement Confirmation: positive ETCO2 and breath sounds checked- equal and bilateral Tube secured with: Tape Dental Injury: Teeth and Oropharynx as per pre-operative assessment

## 2022-04-21 NOTE — Consult Note (Signed)
[facility] Nurse Consult Note: Reason for Consult: RLE wounds LLE is stabilized with splint and ACE for surgery today at 2pm per orthopedics  Wound type:  Stage 3 Pressure Injury: right medial malleolus  Pressure Injury POA: Yes Measurement: Right medial malleolus: 0.5cm x 0.5cm x 0.2cm  Wound bed: 95% pink, clean, 5% fibrinous material.  Patient self reports "wound care Dr." Comes to see her at the SNF she resides at currently Drainage (amount, consistency, odor) scant, non purulent  Periwound:intact She has ecchymosis  over the RLE from the knee down; it is reported that she has had several falls and patient self reports "bumping against things" Dressing procedure/placement/frequency: Continue silicone foam per the nursing skin care order set, change every 3 days.  LLE to be addressed today during surgery   Discussed POC with patient and bedside nurse.  Re consult if needed, will not follow at this time. Thanks  Westlee Devita [employer], RN,CWOCN, CNS, [place] [phone])

## 2022-04-21 NOTE — ED Notes (Signed)
[facility] en-route for transport.

## 2022-04-21 NOTE — ED Notes (Signed)
Ortho Tech at bedside.  

## 2022-04-21 NOTE — H&P (View-Only) (Signed)
Reason for Consult:fall with left closed tib fib fracture. Tibial shaft Referring Physician: Marlin Canary DO  Carolyn [name] is an 67 y.o. female.  HPI: 67 year old female with history of alcoholism with chronic pancreatitis malabsorption cirrhosis chronically staying at [facility] with recent flu with still some persistent symptoms and mechanical fall with a left tibial shaft fracture and proximal fibular fracture.  This was a closed injury she is unable to walk pain was severe.  She had history of severe protein malnourishment with recent admission which is improved.  Patient had albumin of 3.2 total protein 6.0 with normal bilirubin alkaline phosphatase and mild elevation in ALT at 46 with normal being 0-44.  Recent [facility] patient was transfused and current hemoglobin is 9.2.  Elevated white count 15.6 thousand.  Patient was seen in the emergency room placed in splint.  Past Medical History:  Diagnosis Date   Alcoholic cirrhosis (HCC)    Chronic pancreatitis (HCC)    Endometriosis    GERD (gastroesophageal reflux disease)    Liver disease    Malabsorption    MALABSORPTION SYNDROME   Osteoporosis 03/2011   t score -2.5   Pancreatitis    due to cyst and tumors due to calcifications   Seizure (HCC)    no seizure disorder   Vitamin D deficiency 2012   VIT D 15    Past Surgical History:  Procedure Laterality Date   APPENDECTOMY     BIOPSY  01/19/2022   Procedure: BIOPSY;  Surgeon: Shellia Cleverly, DO;  Location: [facility];  Service: Gastroenterology;;   CHOLECYSTECTOMY     ESOPHAGOGASTRODUODENOSCOPY (EGD) WITH PROPOFOL N/A 01/19/2022   Procedure: ESOPHAGOGASTRODUODENOSCOPY (EGD) WITH PROPOFOL;  Surgeon: Shellia Cleverly, DO;  Location: [facility];  Service: Gastroenterology;  Laterality: N/A;   FEEDING TUBE RELOCATION  2010   JEJUNOSTOMY FEEDING TUBE  1990   MULTIPLE GASTRIC SURGERIES     MYOMECTOMY     OPEN REDUCTION INTERNAL FIXATION (ORIF) DISTAL RADIAL FRACTURE  Left 06/29/2018   Procedure: OPEN REDUCTION INTERNAL FIXATION (ORIF)LEFT  DISTAL RADIAL FRACTURE;  Surgeon: Betha Loa, MD;  Location: [facility];  Service: Orthopedics;  Laterality: Left;   ROUX-EN-Y GASTRIC BYPASS     TUBAL LIGATION      Family History  Problem Relation Age of Onset   Cancer Father        BONE   Breast cancer Maternal Grandmother     Social History:  reports that she has never smoked. She has never used smokeless tobacco. She reports current alcohol use. She reports that she does not use drugs.  Allergies:  Allergies  Allergen Reactions   Peanut-Containing Drug Products Anaphylaxis    Tree nuts    Medications: I have reviewed the patient's current medications.  Results for orders placed or performed during the hospital encounter of 04/20/22 (from the past 48 hour(s))  CBC with Differential     Status: Abnormal   Collection Time: 04/20/22 11:00 PM  Result Value Ref Range   WBC 19.6 (H) 4.0 - 10.5 K/uL   RBC 2.71 (L) 3.87 - 5.11 MIL/uL   Hemoglobin 8.4 (L) 12.0 - 15.0 g/dL   HCT 42.5 (L) 95.6 - 38.7 %   MCV 95.6 80.0 - 100.0 fL   MCH 31.0 26.0 - 34.0 pg   MCHC 32.4 30.0 - 36.0 g/dL   RDW 56.4 (H) 33.2 - 95.1 %   Platelets 178 150 - 400 K/uL   nRBC 0.0 0.0 -  0.2 %   Neutrophils Relative % 73 %   Neutro Abs 14.3 (H) 1.7 - 7.7 K/uL   Lymphocytes Relative 15 %   Lymphs Abs 3.0 0.7 - 4.0 K/uL   Monocytes Relative 8 %   Monocytes Absolute 1.6 (H) 0.1 - 1.0 K/uL   Eosinophils Relative 0 %   Eosinophils Absolute 0.0 0.0 - 0.5 K/uL   Basophils Relative 0 %   Basophils Absolute 0.1 0.0 - 0.1 K/uL   Immature Granulocytes 4 %   Abs Immature Granulocytes 0.68 (H) 0.00 - 0.07 K/uL    Comment: Performed at [facility], [place] [address] [place], [place] [address]  Basic metabolic panel     Status: Abnormal   Collection Time: 04/20/22 11:00 PM  Result Value Ref Range   Sodium 132 (L) 135 - 145 mmol/L   Potassium 3.9 3.5 - 5.1  mmol/L   Chloride 100 98 - 111 mmol/L   CO2 25 22 - 32 mmol/L   Glucose, Bld 76 70 - 99 mg/dL    Comment: Glucose reference range applies only to samples taken after fasting for at least 8 hours.   BUN 42 (H) 8 - 23 mg/dL   Creatinine, Ser 0.93 0.44 - 1.00 mg/dL   Calcium 8.5 (L) 8.9 - 10.3 mg/dL   GFR, Estimated >60 >60 mL/min    Comment: (NOTE) Calculated using the CKD-EPI Creatinine Equation (2021)    Anion gap 7 5 - 15    Comment: Performed at [facility], [place] [address] [place], [place] [address]  Resp panel by RT-PCR (RSV, Flu A&B, Covid) Anterior Nasal Swab     Status: Abnormal   Collection Time: 04/21/22  1:07 AM   Specimen: Anterior Nasal Swab  Result Value Ref Range   SARS Coronavirus 2 by RT PCR NEGATIVE NEGATIVE    Comment: (NOTE) SARS-CoV-2 target nucleic acids are NOT DETECTED.  The SARS-CoV-2 RNA is generally detectable in upper respiratory specimens during the acute phase of infection. The lowest concentration of SARS-CoV-2 viral copies this assay can detect is 138 copies/mL. A negative result does not preclude SARS-Cov-2 infection and should not be used as the sole basis for treatment or other patient management decisions. A negative result may occur with  improper specimen collection/handling, submission of specimen other than nasopharyngeal swab, presence of viral mutation(s) within the areas targeted by this assay, and inadequate number of viral copies(<138 copies/mL). A negative result must be combined with clinical observations, patient history, and epidemiological information. The expected result is Negative.  Fact Sheet for Patients:  EntrepreneurPulse.com.au  Fact Sheet for Healthcare Providers:  IncredibleEmployment.be  This test is no t yet approved or cleared by the [place] FDA and  has been authorized for detection and/or diagnosis of SARS-CoV-2 by FDA under an Emergency Use  Authorization (EUA). This EUA will remain  in effect (meaning this test can be used) for the duration of the COVID-19 declaration under Section 564(b)(1) of the Act, 21 U.S.C.section 360bbb-3(b)(1), unless the authorization is terminated  or revoked sooner.       Influenza A by PCR POSITIVE (A) NEGATIVE   Influenza B by PCR NEGATIVE NEGATIVE    Comment: (NOTE) The Xpert Xpress SARS-CoV-2/FLU/RSV plus assay is intended as an aid in the diagnosis of influenza from Nasopharyngeal swab specimens and should not be used as a sole basis for treatment. Nasal washings and aspirates are unacceptable for Xpert Xpress SARS-CoV-2/FLU/RSV testing.  Fact Sheet for Patients: EntrepreneurPulse.com.au  Fact Sheet for Healthcare Providers: SeriousBroker.it  This test is not yet approved or cleared by the [place] FDA and has been authorized for detection and/or diagnosis of SARS-CoV-2 by FDA under an Emergency Use Authorization (EUA). This EUA will remain in effect (meaning this test can be used) for the duration of the COVID-19 declaration under Section 564(b)(1) of the Act, 21 U.S.C. section 360bbb-3(b)(1), unless the authorization is terminated or revoked.     Resp Syncytial Virus by PCR NEGATIVE NEGATIVE    Comment: (NOTE) Fact Sheet for Patients: BloggerCourse.com  Fact Sheet for Healthcare Providers: SeriousBroker.it  This test is not yet approved or cleared by the [place] FDA and has been authorized for detection and/or diagnosis of SARS-CoV-2 by FDA under an Emergency Use Authorization (EUA). This EUA will remain in effect (meaning this test can be used) for the duration of the COVID-19 declaration under Section 564(b)(1) of the Act, 21 U.S.C. section 360bbb-3(b)(1), unless the authorization is terminated or revoked.  Performed at [facility], [address], [place], [place] [address]   Type and screen [facility]     Status: None   Collection Time: 04/21/22  2:20 AM  Result Value Ref Range   ABO/RH(D) B POS    Antibody Screen NEG    Sample Expiration      04/24/2022,2359 Performed at [facility], [address], [place], [place] [address]   Type and screen [facility]     Status: None   Collection Time: 04/21/22  4:38 AM  Result Value Ref Range   ABO/RH(D) B POS    Antibody Screen NEG    Sample Expiration      04/24/2022,2359 Performed at [facility], [address], [place], [place] [address]   Comprehensive metabolic panel     Status: Abnormal   Collection Time: 04/21/22  4:48 AM  Result Value Ref Range   Sodium 133 (L) 135 - 145 mmol/L   Potassium 3.7 3.5 - 5.1 mmol/L   Chloride 99 98 - 111 mmol/L   CO2 26 22 - 32 mmol/L   Glucose, Bld 73 70 - 99 mg/dL    Comment: Glucose reference range applies only to samples taken after fasting for at least 8 hours.   BUN 35 (H) 8 - 23 mg/dL   Creatinine, Ser 0.31 0.44 - 1.00 mg/dL   Calcium 8.8 (L) 8.9 - 10.3 mg/dL   Total Protein 6.0 (L) 6.5 - 8.1 g/dL   Albumin 3.2 (L) 3.5 - 5.0 g/dL   AST 35 15 - 41 U/L   ALT 46 (H) 0 - 44 U/L   Alkaline Phosphatase 91 38 - 126 U/L   Total Bilirubin 0.5 0.3 - 1.2 mg/dL   GFR, Estimated >59 >45 mL/min    Comment: (NOTE) Calculated using the CKD-EPI Creatinine Equation (2021)    Anion gap 8 5 - 15    Comment: Performed at [facility], [address] [place], [place] [address]  CBC     Status: Abnormal   Collection Time: 04/21/22  4:48 AM  Result Value Ref Range   WBC 15.6 (H) 4.0 - 10.5 K/uL   RBC 2.87 (L) 3.87 - 5.11 MIL/uL   Hemoglobin 9.2 (L) 12.0 - 15.0 g/dL   HCT 24.4 (L) 62.8 - 63.8 %   MCV 94.4 80.0 - 100.0 fL   MCH 32.1 26.0 - 34.0 pg   MCHC 33.9 30.0 -  36.0 g/dL   RDW 15.3 11.5 - 15.5 %   Platelets 182 150 - 400 K/uL   nRBC 0.0 0.0 - 0.2 %    Comment:  Performed at [facility], [place] [address], [place], [place] [address]  MRSA Next Gen by PCR, Nasal     Status: None   Collection Time: 04/21/22  5:00 AM   Specimen: Nasal Mucosa; Nasal Swab  Result Value Ref Range   MRSA by PCR Next Gen NOT DETECTED NOT DETECTED    Comment: (NOTE) The GeneXpert MRSA Assay (FDA approved for NASAL specimens only), is one component of a comprehensive MRSA colonization surveillance program. It is not intended to diagnose MRSA infection nor to guide or monitor treatment for MRSA infections. Test performance is not FDA approved in patients less than 38 years old. Performed at [facility], [place] [address] [place], [place] [address]     DG Tibia/Fibula Left  Result Date: 04/20/2022 CLINICAL DATA:  Fracture.  Fall yesterday at facility. EXAM: LEFT KNEE - 1-2 VIEW; LEFT TIBIA AND FIBULA - 2 VIEW; LEFT ANKLE - 2 VIEW COMPARISON:  None Available. FINDINGS: Left knee/tibia/fibula: There is mildly displaced fracture of the left fibular head and neck. No appreciable fracture of the distal femur, patella tibia. No appreciable joint effusion. There is comminuted displaced fracture of the distal tibial shaft with a proximally 1 cortex width lateral displacement of the distal fracture fragment. Soft tissue swelling about the distal leg. Left ankle: No fracture or dislocation no significant soft tissue findings. IMPRESSION: 1. Mildly displaced fracture of the left fibular head and neck. 2. Comminuted displaced fracture of the distal tibial shaft. 3. No fracture or dislocation of the left ankle. Electronically Signed   By: Keane Police D.O.   On: 04/20/2022 23:39   DG Knee 2 Views Left  Result Date: 04/20/2022 CLINICAL DATA:  Fracture.  Fall yesterday at facility. EXAM: LEFT KNEE - 1-2 VIEW; LEFT TIBIA AND FIBULA - 2 VIEW; LEFT ANKLE - 2 VIEW COMPARISON:  None Available. FINDINGS: Left knee/tibia/fibula: There is mildly displaced fracture of the left fibular head and  neck. No appreciable fracture of the distal femur, patella tibia. No appreciable joint effusion. There is comminuted displaced fracture of the distal tibial shaft with a proximally 1 cortex width lateral displacement of the distal fracture fragment. Soft tissue swelling about the distal leg. Left ankle: No fracture or dislocation no significant soft tissue findings. IMPRESSION: 1. Mildly displaced fracture of the left fibular head and neck. 2. Comminuted displaced fracture of the distal tibial shaft. 3. No fracture or dislocation of the left ankle. Electronically Signed   By: Keane Police D.O.   On: 04/20/2022 23:39   DG Ankle 2 Views Left  Result Date: 04/20/2022 CLINICAL DATA:  Fracture.  Fall yesterday at facility. EXAM: LEFT KNEE - 1-2 VIEW; LEFT TIBIA AND FIBULA - 2 VIEW; LEFT ANKLE - 2 VIEW COMPARISON:  None Available. FINDINGS: Left knee/tibia/fibula: There is mildly displaced fracture of the left fibular head and neck. No appreciable fracture of the distal femur, patella tibia. No appreciable joint effusion. There is comminuted displaced fracture of the distal tibial shaft with a proximally 1 cortex width lateral displacement of the distal fracture fragment. Soft tissue swelling about the distal leg. Left ankle: No fracture or dislocation no significant soft tissue findings. IMPRESSION: 1. Mildly displaced fracture of the left fibular head and neck. 2. Comminuted displaced fracture of the distal tibial shaft. 3.  No fracture or dislocation of the left ankle. Electronically Signed   By: Keane Police D.O.   On: 04/20/2022 23:39    ROS positive for malnourishment alcoholism past history of elevated liver enzymes now improved.  History of protein malnourishment improved.  Recent flu.  No current dyspnea. Blood pressure (!) 150/84, pulse 87, temperature 98.6 F (37 C), temperature source Oral, resp. rate 16, height 5\' 8"  (1.727 m), weight 42 kg, last menstrual period 03/10/2009, SpO2 100 %. Physical  Exam Constitutional:      Comments: Thin malnourished appearance.  HENT:     Head: Normocephalic.     Right Ear: External ear normal.     Left Ear: External ear normal.     Mouth/Throat:     Mouth: Mucous membranes are moist.  Eyes:     Extraocular Movements: Extraocular movements intact.  Cardiovascular:     Rate and Rhythm: Normal rate.  Pulmonary:     Effort: Pulmonary effort is normal. No respiratory distress.     Breath sounds: No wheezing.  Musculoskeletal:     Comments: Patient's short leg splint anterior compartment is soft.  She is able wiggle her toes.  Good capillary refill.  Toes are warm.  Sensation in the toes is intact.  Skin:    General: Skin is warm.     Capillary Refill: Capillary refill takes less than 2 seconds.  Neurological:     General: No focal deficit present.     Mental Status: She is alert.  Psychiatric:        Mood and Affect: Mood normal.     Assessment/Plan: Patient with left closed tib-fib fracture.  Plan intramedullary nail fixation for stabilization of tib-fib fracture with distal third shaft fracture of the tibia.  We discussed risks of surgery.  Anesthesia with her recent respiratory problems will be choice.  Questions were elicited and answered she understands and agrees to proceed.  Marybelle Killings 04/21/2022, 7:37 AM

## 2022-04-21 NOTE — Progress Notes (Signed)
Received from [facility] team noted and reported to myself that upon picking pt up from sending facility that they noted left lower extremity appeared to them to be shortened and rotated. They supported limb on the chance hip may as well be injured-they had also noted that only the distal end of the limb had been x-rayed and not the hip. Will notify [facility] on call

## 2022-04-21 NOTE — Interval H&P Note (Signed)
History and Physical Interval Note:  04/21/2022 2:15 PM  Carolyn [name]  has presented today for surgery, with the diagnosis of Right Tib Fib Frtacture.  The various methods of treatment have been discussed with the patient and family. After consideration of risks, benefits and other options for treatment, the patient has consented to  Procedure(s): LEFT INTRAMEDULLARY (IM) NAIL TIBIAL (Left) as a surgical intervention.  The patient's history has been reviewed, patient examined, no change in status, stable for surgery.  I have reviewed the patient's chart and labs.  Questions were answered to the patient's satisfaction.     Marybelle Killings

## 2022-04-21 NOTE — Hospital Course (Signed)
Carolyn [name] is a 67 y.o. female with medical history significant for alcoholism now in remission complicated by chronic alcoholic pancreatitis with malabsorption, chronic gastric ulcer with contained perforation, hepatic cirrhosis with portal hypertensive gastropathy, anemia of chronic disease and folate deficiency, hypothyroidism, protein and nutritional deficiencies who is admitted with acute fibular neck and comminuted distal tibial shaft fractures.

## 2022-04-22 ENCOUNTER — Encounter (HOSPITAL_COMMUNITY): Payer: Self-pay | Admitting: Orthopaedic Surgery

## 2022-04-22 DIAGNOSIS — S82252A Displaced comminuted fracture of shaft of left tibia, initial encounter for closed fracture: Secondary | ICD-10-CM | POA: Diagnosis not present

## 2022-04-22 LAB — MISC LABCORP TEST (SEND OUT): Labcorp test code: 81950

## 2022-04-22 MED ORDER — ENOXAPARIN SODIUM 30 MG/0.3ML IJ SOSY
30.0000 mg | PREFILLED_SYRINGE | INTRAMUSCULAR | Status: DC
  Administered 2022-04-23: 30 mg via SUBCUTANEOUS
  Filled 2022-04-22: qty 0.3

## 2022-04-22 MED ORDER — FUROSEMIDE 20 MG PO TABS
20.0000 mg | ORAL_TABLET | Freq: Every day | ORAL | Status: DC
  Administered 2022-04-23: 20 mg via ORAL
  Filled 2022-04-22: qty 1

## 2022-04-22 MED ORDER — METHOCARBAMOL 500 MG PO TABS
500.0000 mg | ORAL_TABLET | Freq: Four times a day (QID) | ORAL | Status: DC | PRN
  Administered 2022-04-22 – 2022-04-23 (×3): 500 mg via ORAL
  Filled 2022-04-22 (×3): qty 1

## 2022-04-22 MED ORDER — METHOCARBAMOL 500 MG PO TABS
500.0000 mg | ORAL_TABLET | Freq: Three times a day (TID) | ORAL | Status: DC | PRN
  Administered 2022-04-22: 500 mg via ORAL
  Filled 2022-04-22: qty 1

## 2022-04-22 MED ORDER — METOPROLOL TARTRATE 12.5 MG HALF TABLET
12.5000 mg | ORAL_TABLET | Freq: Two times a day (BID) | ORAL | Status: DC
  Administered 2022-04-22 – 2022-04-23 (×2): 12.5 mg via ORAL
  Filled 2022-04-22 (×2): qty 1

## 2022-04-22 MED ORDER — ENSURE ENLIVE PO LIQD: 237.0000 mL | Freq: Two times a day (BID) | ORAL | Status: DC

## 2022-04-22 NOTE — Evaluation (Signed)
Physical Therapy Evaluation Patient Details Name: Carolyn [name] MRN: [id number] DOB: [birth year] Today's Date: 04/22/2022  History of Present Illness  Pt is a 67 y.o. female who presents on 04/20/22 from [place] rehab with L closed tib-fib fracture. Now s/p L tibial nail with proximal interlock. PMH: alcoholism with chronic pancreatitis, malabsorption, cirrhosis, osteoporosis.  Clinical Impression  Pt admitted with above diagnosis. Pt from [facility] where she was receiving rehab and is very motivated to continue rehab to get back home to her husband and 3 dogs. Pt moving well on eval but with LLE weakness, esp hip abductors and ER which is causing her LLE to be maximally rotated in the bed. Helped position her in neutral after session and she is more aware now to watch her positioning. Will also work on strengthening outer hip. Pt ambulated 20' with RW and min A with chair brought behind for safety. Recommend return to [facility] to complete her rehab course.   Pt currently with functional limitations due to the deficits listed below (see PT Problem List). Pt will benefit from skilled PT to increase their independence and safety with mobility to allow discharge to the venue listed below.          Recommendations for follow up therapy are one component of a multi-disciplinary discharge planning process, led by the attending physician.  Recommendations may be updated based on patient status, additional functional criteria and insurance authorization.  Follow Up Recommendations Skilled nursing-short term rehab (<3 hours/day) Can patient physically be transported by private vehicle: Yes    Assistance Recommended at Discharge Intermittent Supervision/Assistance  Patient can return home with the following  A little help with walking and/or transfers;A little help with bathing/dressing/bathroom;Assistance with cooking/housework;Direct supervision/assist for medications management;Direct supervision/assist  for financial management;Assist for transportation;Help with stairs or ramp for entrance    Equipment Recommendations None recommended by PT  Recommendations for Other Services       Functional Status Assessment Patient has had a recent decline in their functional status and demonstrates the ability to make significant improvements in function in a reasonable and predictable amount of time.     Precautions / Restrictions Precautions Precautions: Fall Precaution Comments: incontinence Restrictions Weight Bearing Restrictions: No LLE Weight Bearing: Weight bearing as tolerated      Mobility  Bed Mobility Overal bed mobility: Needs Assistance Bed Mobility: Supine to Sit     Supine to sit: Min assist     General bed mobility comments: min A to LLE, pt able to manage trunk for moving to EOB    Transfers Overall transfer level: Needs assistance Equipment used: Rolling walker (2 wheels) Transfers: Sit to/from Stand Sit to Stand: Min assist, +2 safety/equipment           General transfer comment: vc's for hand placement, +2 for safety    Ambulation/Gait Ambulation/Gait assistance: Min assist, +2 safety/equipment Gait Distance (Feet): 20 Feet Assistive device: Rolling walker (2 wheels) Gait Pattern/deviations: Step-to pattern Gait velocity: decreased Gait velocity interpretation: <1.31 ft/sec, indicative of household ambulator   General Gait Details: chair brought behind pt for safety. Pt with IR L hip with swing through and foot strike, worked on correcting this. She will have an easier time with a shoe on R foot to even out with CAM boot, she is going to ask husband to bring one.  Stairs            Wheelchair Mobility    Modified Rankin (Stroke Patients Only)  Balance Overall balance assessment: Needs assistance Sitting-balance support: Feet supported Sitting balance-Leahy Scale: Fair     Standing balance support: Bilateral upper extremity  supported, Reliant on assistive device for balance Standing balance-Leahy Scale: Poor Standing balance comment: needs AD for safety as well as external support                             Pertinent Vitals/Pain Pain Assessment Pain Assessment: Faces Faces Pain Scale: Hurts a little bit Pain Location: LLE with mvmt Pain Descriptors / Indicators: Grimacing Pain Intervention(s): Limited activity within patient's tolerance, Monitored during session, Premedicated before session    Home Living Family/patient expects to be discharged to:: Skilled nursing facility Living Arrangements: Spouse/significant other                      Prior Function Prior Level of Function : History of Falls (last six months);Needs assist             Mobility Comments: had recently began to walk again with therapy at [facility] ADLs Comments: supervision+ for ADL     Hand Dominance   Dominant Hand: Right    Extremity/Trunk Assessment   Upper Extremity Assessment Upper Extremity Assessment: Defer to OT evaluation    Lower Extremity Assessment Lower Extremity Assessment: Generalized weakness;LLE deficits/detail RLE Deficits / Details: lacking ~15 deg knee extension, hip flex 3+/5, knee ext 3+/5, ankle stiff LLE Deficits / Details: hip flex 3-/5 (has difficulty lifting LLE with CAM boot on). In bed pt's LLE rolls to whichever side gravity is pulling on so she is either in maximal IR or ER and with decreased awareness until pointed out. She is able to correct but has difficulty maintaining due to weakness, especially of ER and abductors LLE Sensation: WNL LLE Coordination: decreased gross motor    Cervical / Trunk Assessment Cervical / Trunk Assessment: Kyphotic (mild)  Communication   Communication: No difficulties  Cognition Arousal/Alertness: Awake/alert Behavior During Therapy: WFL for tasks assessed/performed Overall Cognitive Status: No family/caregiver present to determine  baseline cognitive functioning                                 General Comments: WFL for basic conversation and can give an accurate history of recent events that agrees with chart. Has some decreased awareness of body position but showed good understanding when educated and will hopefully have good carryover        General Comments General comments (skin integrity, edema, etc.): trough made for pt's L foot once in recliner to attempt to hold hip in neutral as well as elevate. Pt very motivated to mobilize.    Exercises General Exercises - Lower Extremity Ankle Circles/Pumps: AROM, Right, 15 reps, Seated Hip ABduction/ADduction: AAROM, Left, 10 reps, Seated Other Exercises Other Exercises: L hip ER/ IR (IR only to neutral) x10 in sitting   Assessment/Plan    PT Assessment Patient needs continued PT services  PT Problem List Decreased strength;Decreased range of motion;Decreased activity tolerance;Decreased balance;Decreased mobility;Decreased knowledge of use of DME;Pain       PT Treatment Interventions DME instruction;Gait training;Functional mobility training;Therapeutic activities;Therapeutic exercise;Balance training;Neuromuscular re-education;Patient/family education;Cognitive remediation    PT Goals (Current goals can be found in the Care Plan section)  Acute Rehab PT Goals Patient Stated Goal: walk independently, return home to her husband and 3 dogs PT Goal Formulation: With patient  Time For Goal Achievement: 05/06/22 Potential to Achieve Goals: Good    Frequency Min 3X/week     Co-evaluation               AM-PAC PT "6 Clicks" Mobility  Outcome Measure Help needed turning from your back to your side while in a flat bed without using bedrails?: A Little Help needed moving from lying on your back to sitting on the side of a flat bed without using bedrails?: A Little Help needed moving to and from a bed to a chair (including a wheelchair)?: A  Little Help needed standing up from a chair using your arms (e.g., wheelchair or bedside chair)?: A Little Help needed to walk in hospital room?: A Little Help needed climbing 3-5 steps with a railing? : Total 6 Click Score: 16    End of Session Equipment Utilized During Treatment: Gait belt Activity Tolerance: Patient tolerated treatment well Patient left: in chair;with call bell/phone within reach;with chair alarm set Nurse Communication: Mobility status PT Visit Diagnosis: Pain;Difficulty in walking, not elsewhere classified (R26.2);History of falling (Z91.81) Pain - Right/Left: Left Pain - part of body: Leg    Time: 1017-5102 PT Time Calculation (min) (ACUTE ONLY): 33 min   Charges:   PT Evaluation $PT Eval Moderate Complexity: [place] chat preferred Office [place] 04/22/2022, 10:33 AM

## 2022-04-22 NOTE — Telephone Encounter (Signed)
Please advise 

## 2022-04-22 NOTE — TOC CAGE-AID Note (Signed)
Transition of Care [facility]) - CAGE-AID Screening   Patient Details  Name: MILYNN QUIRION MRN: [id number] Date of Birth: 06/17/1955  Elvina Sidle, RN Trauma Response Nurse Phone Number: [phone] 04/22/2022, 3:52 PM   Clinical Narrative:  Pt has hx of chronic alcoholism, with chronic pancreatitis , cirrhosis of the liver. Pt states that she does not drink anymore- Currently is staying at [facility] for rehab from last admission when this injury occurred.   CAGE-AID Screening:    Have You Ever Felt You Ought to Cut Down on Your Drinking or Drug Use?: Yes (hx of chronic alcoholism, in remission) Have People Annoyed You By [employer] Your Drinking Or Drug Use?: No Have You Felt Bad Or Guilty About Your Drinking Or Drug Use?: No Have You Ever Had a Drink or Used Drugs First Thing In The Morning to Steady Your Nerves or to Get Rid of a Hangover?: No CAGE-AID Score: 1  Substance Abuse Education Offered: No (pt has hx of chronic alcoholism- does not drink at this time-)

## 2022-04-22 NOTE — Progress Notes (Incomplete)
MD, patient is requesting an order for muscle relaxer.  Thank you.

## 2022-04-22 NOTE — Progress Notes (Signed)
Patient ID: Carolyn [name], female   DOB: 20-May-1955, 67 y.o.   MRN: [id number]   Subjective: 1 Day Post-Op Procedure(s) (LRB): LEFT INTRAMEDULLARY (IM) NAIL TIBIAL (Left) Patient reports pain as moderate.    Objective: Vital signs in last 24 hours: Temp:  [97.5 F (36.4 C)-98.7 F (37.1 C)] 97.5 F (36.4 C) (02/08 0706) Pulse Rate:  [74-103] 80 (02/08 0706) Resp:  [14-24] 17 (02/08 0706) BP: (115-166)/(72-95) 115/72 (02/08 0706) SpO2:  [97 %-100 %] 100 % (02/08 0706)  Intake/Output from previous day: 02/07 0701 - 02/08 0700 In: 800 [I.V.:700; IV Piggyback:100] Out: 750 [Urine:750] Intake/Output this shift: No intake/output data recorded.  Recent Labs    04/20/22 2300 04/21/22 0448  HGB 8.4* 9.2*   Recent Labs    04/20/22 2300 04/21/22 0448  WBC 19.6* 15.6*  RBC 2.71* 2.87*  HCT 25.9* 27.1*  PLT 178 182   Recent Labs    04/20/22 2300 04/21/22 0448  NA 132* 133*  K 3.9 3.7  CL 100 99  CO2 25 26  BUN 42* 35*  CREATININE 0.93 0.73  GLUCOSE 76 73  CALCIUM 8.5* 8.8*   No results for input(s): "LABPT", "INR" in the last 72 hours.  Neurologically intact DG Tibia/Fibula Left  Result Date: 04/21/2022 CLINICAL DATA:  Left tibial fracture. EXAM: LEFT TIBIA AND FIBULA - 2 VIEW COMPARISON:  04/20/2022 FINDINGS: Intraoperative fluoroscopic images demonstrate placement of an intramedullary nail in the left tibia. Reduction of the distal tibial fracture with near anatomic alignment. Proximal fibular fracture. IMPRESSION: Internal fixation of left tibial fracture. Electronically Signed   By: Markus Daft M.D.   On: 04/21/2022 15:50   DG C-Arm 1-60 Min-No Report  Result Date: 04/21/2022 Fluoroscopy was utilized by the requesting physician.  No radiographic interpretation.   DG C-Arm 1-60 Min-No Report  Result Date: 04/21/2022 Fluoroscopy was utilized by the requesting physician.  No radiographic interpretation.    Assessment/Plan: 1 Day Post-Op Procedure(s) (LRB): LEFT  INTRAMEDULLARY (IM) NAIL TIBIAL (Left) Up with therapy.  Distal interlock screws not placed due to skin ulcer over site needed. Fx if oblique and she will be weight bearing as pain allows.   Carolyn [name] 04/22/2022, 7:41 AM

## 2022-04-22 NOTE — Evaluation (Signed)
Occupational Therapy Evaluation Patient Details Name: Carolyn [name] MRN: [id number] DOB: [birth year] Today's Date: 04/22/2022   History of Present Illness Pt is a 67 y.o. female who presents on 04/20/22 from [facility] rehab with L closed tib-fib fracture. Now s/p L tibial nail with proximal interlock. PMH: alcoholism with chronic pancreatitis, malabsorption, cirrhosis, osteoporosis.   Clinical Impression   Pt recently at [facility] for rehabilitation, and had been receiving supervision+ assist for ADL. Upon eval, pt presents with decreased muscle mass, generalized weakness, decreased balance, safety, and activity tolerance. Pt performing LB ADL with up to min A and UB ADL with set-up. Providing education during session regarding body position at rest as pt with tendency to internally rotate hip. Recommending discharge at SNF for continued OT services.      Recommendations for follow up therapy are one component of a multi-disciplinary discharge planning process, led by the attending physician.  Recommendations may be updated based on patient status, additional functional criteria and insurance authorization.   Follow Up Recommendations  Skilled nursing-short term rehab (<3 hours/day)     Assistance Recommended at Discharge Intermittent Supervision/Assistance  Patient can return home with the following A little help with walking and/or transfers;A little help with bathing/dressing/bathroom;Assistance with cooking/housework;Assist for transportation;Help with stairs or ramp for entrance    Functional Status Assessment  Patient has had a recent decline in their functional status and demonstrates the ability to make significant improvements in function in a reasonable and predictable amount of time.  Equipment Recommendations  Other (comment) (defer)    Recommendations for Other Services       Precautions / Restrictions Precautions Precautions: Fall Precaution Comments:  incontinence Restrictions Weight Bearing Restrictions: No LLE Weight Bearing: Weight bearing as tolerated      Mobility Bed Mobility Overal bed mobility: Needs Assistance Bed Mobility: Supine to Sit     Supine to sit: Min assist     General bed mobility comments: min A to LLE, pt able to manage trunk for moving to EOB    Transfers Overall transfer level: Needs assistance Equipment used: Rolling walker (2 wheels) Transfers: Sit to/from Stand Sit to Stand: Min assist, +2 safety/equipment           General transfer comment: vc's for hand placement, +2 for safety      Balance Overall balance assessment: Needs assistance Sitting-balance support: Feet supported Sitting balance-Leahy Scale: Fair     Standing balance support: Bilateral upper extremity supported, Reliant on assistive device for balance Standing balance-Leahy Scale: Poor Standing balance comment: needs AD for safety as well as external support                           ADL either performed or assessed with clinical judgement   ADL Overall ADL's : Needs assistance/impaired Eating/Feeding: Independent;Sitting   Grooming: Minimal assistance;Standing Grooming Details (indicate cue type and reason): balance/weightbearing Upper Body Bathing: Set up;Sitting   Lower Body Bathing: Minimal assistance;Sit to/from stand   Upper Body Dressing : Sitting;Set up   Lower Body Dressing: Set up;Bed level Lower Body Dressing Details (indicate cue type and reason): donning sock sitting in bed Toilet Transfer: Minimal assistance;Ambulation;Rolling walker (2 wheels) Toilet Transfer Details (indicate cue type and reason): min A for rise Toileting- Clothing Manipulation and Hygiene: Min guard;Sitting/lateral lean       Functional mobility during ADLs: Minimal assistance;Rolling walker (2 wheels)       Vision Baseline Vision/History: 0 No visual deficits  Ability to See in Adequate Light: 0  Adequate Patient Visual Report: No change from baseline Vision Assessment?: No apparent visual deficits Additional Comments: WFL for tasks assessed     Perception Perception Perception Tested?: No   Praxis Praxis Praxis tested?: Not tested    Pertinent Vitals/Pain Pain Assessment Pain Assessment: Faces Faces Pain Scale: Hurts a little bit Pain Location: LLE with mvmt Pain Descriptors / Indicators: Grimacing Pain Intervention(s): Limited activity within patient's tolerance, Monitored during session, Repositioned     Hand Dominance Right   Extremity/Trunk Assessment Upper Extremity Assessment Upper Extremity Assessment: Generalized weakness   Lower Extremity Assessment Lower Extremity Assessment: Defer to PT evaluation RLE Deficits / Details: lacking ~15 deg knee extension, hip flex 3+/5, knee ext 3+/5, ankle stiff LLE Deficits / Details: hip flex 3-/5 (has difficulty lifting LLE with CAM boot on). In bed pt's LLE rolls to whichever side gravity is pulling on so she is either in maximal IR or ER and with decreased awareness until pointed out. She is able to correct but has difficulty maintaining due to weakness, especially of ER and abductors LLE Sensation: WNL LLE Coordination: decreased gross motor   Cervical / Trunk Assessment Cervical / Trunk Assessment: Kyphotic (mild)   Communication Communication Communication: No difficulties   Cognition Arousal/Alertness: Awake/alert Behavior During Therapy: WFL for tasks assessed/performed Overall Cognitive Status: No family/caregiver present to determine baseline cognitive functioning                                 General Comments: WFL for basic conversation and can give an accurate history of recent events that agrees with chart. Has some decreased awareness of body position but showed good understanding when educated and will hopefully have good carryover     General Comments  trough made for pt's L foot once  in recliner to attempt to hold hip in neutral as well as elevate. Pt very motivated to mobilize.    Exercises Exercises: General Lower Extremity   Shoulder Instructions      Home Living Family/patient expects to be discharged to:: Skilled nursing facility Living Arrangements: Spouse/significant other                                      Prior Functioning/Environment Prior Level of Function : History of Falls (last six months);Needs assist             Mobility Comments: had recently began to walk again with therapy at [facility] ADLs Comments: supervision+ for ADL        OT Problem List: Decreased strength;Decreased activity tolerance;Impaired balance (sitting and/or standing);Decreased knowledge of use of DME or AE;Decreased knowledge of precautions      OT Treatment/Interventions: Self-care/ADL training;Therapeutic exercise;DME and/or AE instruction;Patient/family education;Balance training;Therapeutic activities    OT Goals(Current goals can be found in the care plan section) Acute Rehab OT Goals Patient Stated Goal: get stronger and walk OT Goal Formulation: With patient Time For Goal Achievement: 05/06/22 Potential to Achieve Goals: Good  OT Frequency: Min 2X/week    Co-evaluation              AM-PAC OT "6 Clicks" Daily Activity     Outcome Measure Help from another person eating meals?: None Help from another person taking care of personal grooming?: A Little Help from another person toileting, which includes using  toliet, bedpan, or urinal?: A Little Help from another person bathing (including washing, rinsing, drying)?: A Little Help from another person to put on and taking off regular upper body clothing?: A Little Help from another person to put on and taking off regular lower body clothing?: A Little 6 Click Score: 19   End of Session Equipment Utilized During Treatment: Gait belt;Rolling walker (2 wheels);Other (comment) (CAM  boot) Nurse Communication: Mobility status  Activity Tolerance: Patient tolerated treatment well Patient left: in chair;with call bell/phone within reach;with chair alarm set  OT Visit Diagnosis: Unsteadiness on feet (R26.81);Muscle weakness (generalized) (M62.81);Other abnormalities of gait and mobility (R26.89)                Time: 7035-0093 OT Time Calculation (min): 28 min Charges:  OT General Charges $OT Visit: 1 Visit OT Evaluation $OT Eval Low Complexity: 1 Low  Elder Cyphers, OTR/L [facility] Acute Rehabilitation Office: [phone]   Magnus Ivan 04/22/2022, 11:27 AM

## 2022-04-22 NOTE — Progress Notes (Signed)
PROGRESS NOTE    Carolyn [name]  QMG:[id number] DOB: 05-Mar-1956 DOA: 04/20/2022 PCP: Merryl Hacker, No    Brief Narrative:  Carolyn [name] is a 67 y.o. female with medical history significant for alcoholism now in remission complicated by chronic alcoholic pancreatitis with malabsorption, chronic gastric ulcer with contained perforation, hepatic cirrhosis with portal hypertensive gastropathy, anemia of chronic disease and folate deficiency, hypothyroidism, protein and nutritional deficiencies who is admitted with acute fibular neck and comminuted distal tibial shaft fractures.   S/p repair on 2/7  Assessment and Plan: Comminuted displaced fracture of distal tibial shaft Displaced fracture of the left fibular head and neck: -s/p surgical repair -PT/OT eval pending   Anemia of chronic disease: Hemoglobin is 8.4.  No obvious bleeding.  Monitor closely.   Leukocytosis: Likely reactive in setting of acute fractures.  Also was started on Augmentin and doxycycline for presumed pressure sore wound infection at her facility. -Continue Augmentin and doxycycline   Chronic alcoholic pancreatitis with malabsorption: Continue Creon.   Chronic gastric ulcer with contained perforation: Continue Protonix.   Hepatic cirrhosis with portal hypertensive gastropathy: Appears compensated without ascites.   Hypothyroidism: Continue Synthroid.   HFpEF: No sign of volume overload, she is malnourished with muscle wasting throughout. -reduced dose of lasix/lopressor   Protein calorie malnutrition/nutritional deficiencies: -Consult to dietitian -Continue feeding supplement -Continue B6, selenium, vitamin A, B12, vitamin D3, vitamin E, zinc supplements   Contact pressure sores: - Continue Augmentin and doxycycline.   Chronic pain: -adjust meds to control pain -muscle relaxer   Anxiety: Continue Cytomel and phenobarbital.  DVT prophylaxis: enoxaparin (LOVENOX) injection 40 mg Start: 04/22/22 0800 SCDs  Start: 04/21/22 1714 SCDs Start: 04/21/22 0109    Code Status: DNR (from chart review)  Disposition Plan:  Level of care: Med-Surg Status is: Inpatient Remains inpatient appropriate because: needs Pt/OT eveal    Consultants:  ortho   Subjective: Feeling better  Objective: Vitals:   04/21/22 1947 04/21/22 2340 04/22/22 0357 04/22/22 0706  BP: 134/80 121/76 127/76 115/72  Pulse: 99 74 75 80  Resp: 17 17 18 17   Temp: 97.8 F (36.6 C) 97.6 F (36.4 C) 97.6 F (36.4 C) (!) 97.5 F (36.4 C)  TempSrc: Oral   Oral  SpO2: 100% 99% 100% 100%  Weight:      Height:        Intake/Output Summary (Last 24 hours) at 04/22/2022 1036 Last data filed at 04/22/2022 0900 Gross per 24 hour  Intake 1040 ml  Output 750 ml  Net 290 ml   Filed Weights   04/21/22 0125  Weight: 42 kg    Examination:   General: Appearance:    Thin female in no acute distress     Lungs:     respirations unlabored  Heart:    Normal heart rate. Normal rhythm. No murmurs, rubs, or gallops.    MS:   All extremities are intact.    Neurologic:   Awake, alert       Data Reviewed: I have personally reviewed following labs and imaging studies  CBC: Recent Labs  Lab 04/20/22 2300 04/21/22 0448  WBC 19.6* 15.6*  NEUTROABS 14.3*  --   HGB 8.4* 9.2*  HCT 25.9* 27.1*  MCV 95.6 94.4  PLT 178 326   Basic Metabolic Panel: Recent Labs  Lab 04/20/22 2300 04/21/22 0448  NA 132* 133*  K 3.9 3.7  CL 100 99  CO2 25 26  GLUCOSE 76 73  BUN 42* 35*  CREATININE  0.93 0.73  CALCIUM 8.5* 8.8*   GFR: Estimated Creatinine Clearance: 47.1 mL/min (by C-G formula based on SCr of 0.73 mg/dL). Liver Function Tests: Recent Labs  Lab 04/21/22 0448  AST 35  ALT 46*  ALKPHOS 91  BILITOT 0.5  PROT 6.0*  ALBUMIN 3.2*   No results for input(s): "LIPASE", "AMYLASE" in the last 168 hours. No results for input(s): "AMMONIA" in the last 168 hours. Coagulation Profile: No results for input(s): "INR", "PROTIME"  in the last 168 hours. Cardiac Enzymes: No results for input(s): "CKTOTAL", "CKMB", "CKMBINDEX", "TROPONINI" in the last 168 hours. BNP (last 3 results) No results for input(s): "PROBNP" in the last 8760 hours. HbA1C: No results for input(s): "HGBA1C" in the last 72 hours. CBG: No results for input(s): "GLUCAP" in the last 168 hours. Lipid Profile: No results for input(s): "CHOL", "HDL", "LDLCALC", "TRIG", "CHOLHDL", "LDLDIRECT" in the last 72 hours. Thyroid Function Tests: No results for input(s): "TSH", "T4TOTAL", "FREET4", "T3FREE", "THYROIDAB" in the last 72 hours. Anemia Panel: No results for input(s): "VITAMINB12", "FOLATE", "FERRITIN", "TIBC", "IRON", "RETICCTPCT" in the last 72 hours. Sepsis Labs: No results for input(s): "PROCALCITON", "LATICACIDVEN" in the last 168 hours.  Recent Results (from the past 240 hour(s))  Resp panel by RT-PCR (RSV, Flu A&B, Covid) Anterior Nasal Swab     Status: Abnormal   Collection Time: 04/21/22  1:07 AM   Specimen: Anterior Nasal Swab  Result Value Ref Range Status   SARS Coronavirus 2 by RT PCR NEGATIVE NEGATIVE Final    Comment: (NOTE) SARS-CoV-2 target nucleic acids are NOT DETECTED.  The SARS-CoV-2 RNA is generally detectable in upper respiratory specimens during the acute phase of infection. The lowest concentration of SARS-CoV-2 viral copies this assay can detect is 138 copies/mL. A negative result does not preclude SARS-Cov-2 infection and should not be used as the sole basis for treatment or other patient management decisions. A negative result may occur with  improper specimen collection/handling, submission of specimen other than nasopharyngeal swab, presence of viral mutation(s) within the areas targeted by this assay, and inadequate number of viral copies(<138 copies/mL). A negative result must be combined with clinical observations, patient history, and epidemiological information. The expected result is Negative.  Fact  Sheet for Patients:  EntrepreneurPulse.com.au  Fact Sheet for Healthcare Providers:  IncredibleEmployment.be  This test is no t yet approved or cleared by the [place] FDA and  has been authorized for detection and/or diagnosis of SARS-CoV-2 by FDA under an Emergency Use Authorization (EUA). This EUA will remain  in effect (meaning this test can be used) for the duration of the COVID-19 declaration under Section 564(b)(1) of the Act, 21 U.S.C.section 360bbb-3(b)(1), unless the authorization is terminated  or revoked sooner.       Influenza A by PCR POSITIVE (A) NEGATIVE Final   Influenza B by PCR NEGATIVE NEGATIVE Final    Comment: (NOTE) The Xpert Xpress SARS-CoV-2/FLU/RSV plus assay is intended as an aid in the diagnosis of influenza from Nasopharyngeal swab specimens and should not be used as a sole basis for treatment. Nasal washings and aspirates are unacceptable for Xpert Xpress SARS-CoV-2/FLU/RSV testing.  Fact Sheet for Patients: EntrepreneurPulse.com.au  Fact Sheet for Healthcare Providers: IncredibleEmployment.be  This test is not yet approved or cleared by the [place] FDA and has been authorized for detection and/or diagnosis of SARS-CoV-2 by FDA under an Emergency Use Authorization (EUA). This EUA will remain in effect (meaning this test can be used) for the duration of the  COVID-19 declaration under Section 564(b)(1) of the Act, 21 U.S.C. section 360bbb-3(b)(1), unless the authorization is terminated or revoked.     Resp Syncytial Virus by PCR NEGATIVE NEGATIVE Final    Comment: (NOTE) Fact Sheet for Patients: EntrepreneurPulse.com.au  Fact Sheet for Healthcare Providers: IncredibleEmployment.be  This test is not yet approved or cleared by the [place] FDA and has been authorized for detection and/or diagnosis of SARS-CoV-2 by FDA  under an Emergency Use Authorization (EUA). This EUA will remain in effect (meaning this test can be used) for the duration of the COVID-19 declaration under Section 564(b)(1) of the Act, 21 U.S.C. section 360bbb-3(b)(1), unless the authorization is terminated or revoked.  Performed at [facility], [place] [address], [place], [place] [address]   MRSA Next Gen by PCR, Nasal     Status: None   Collection Time: 04/21/22  5:00 AM   Specimen: Nasal Mucosa; Nasal Swab  Result Value Ref Range Status   MRSA by PCR Next Gen NOT DETECTED NOT DETECTED Final    Comment: (NOTE) The GeneXpert MRSA Assay (FDA approved for NASAL specimens only), is one component of a comprehensive MRSA colonization surveillance program. It is not intended to diagnose MRSA infection nor to guide or monitor treatment for MRSA infections. Test performance is not FDA approved in patients less than 67 years old. Performed at [facility], [place] [address] [place], [place] [address]          Radiology Studies: DG Tibia/Fibula Left  Result Date: 04/21/2022 CLINICAL DATA:  Left tibial fracture. EXAM: LEFT TIBIA AND FIBULA - 2 VIEW COMPARISON:  04/20/2022 FINDINGS: Intraoperative fluoroscopic images demonstrate placement of an intramedullary nail in the left tibia. Reduction of the distal tibial fracture with near anatomic alignment. Proximal fibular fracture. IMPRESSION: Internal fixation of left tibial fracture. Electronically Signed   By: Markus Daft M.D.   On: 04/21/2022 15:50   DG C-Arm 1-60 Min-No Report  Result Date: 04/21/2022 Fluoroscopy was utilized by the requesting physician.  No radiographic interpretation.   DG C-Arm 1-60 Min-No Report  Result Date: 04/21/2022 Fluoroscopy was utilized by the requesting physician.  No radiographic interpretation.   DG Tibia/Fibula Left  Result Date: 04/20/2022 CLINICAL DATA:  Fracture.  Fall yesterday at facility. EXAM: LEFT KNEE - 1-2 VIEW; LEFT  TIBIA AND FIBULA - 2 VIEW; LEFT ANKLE - 2 VIEW COMPARISON:  None Available. FINDINGS: Left knee/tibia/fibula: There is mildly displaced fracture of the left fibular head and neck. No appreciable fracture of the distal femur, patella tibia. No appreciable joint effusion. There is comminuted displaced fracture of the distal tibial shaft with a proximally 1 cortex width lateral displacement of the distal fracture fragment. Soft tissue swelling about the distal leg. Left ankle: No fracture or dislocation no significant soft tissue findings. IMPRESSION: 1. Mildly displaced fracture of the left fibular head and neck. 2. Comminuted displaced fracture of the distal tibial shaft. 3. No fracture or dislocation of the left ankle. Electronically Signed   By: Keane Police D.O.   On: 04/20/2022 23:39   DG Knee 2 Views Left  Result Date: 04/20/2022 CLINICAL DATA:  Fracture.  Fall yesterday at facility. EXAM: LEFT KNEE - 1-2 VIEW; LEFT TIBIA AND FIBULA - 2 VIEW; LEFT ANKLE - 2 VIEW COMPARISON:  None Available. FINDINGS: Left knee/tibia/fibula: There is mildly displaced fracture of the left fibular head and neck. No appreciable fracture of the distal femur, patella tibia. No appreciable joint effusion. There is comminuted  displaced fracture of the distal tibial shaft with a proximally 1 cortex width lateral displacement of the distal fracture fragment. Soft tissue swelling about the distal leg. Left ankle: No fracture or dislocation no significant soft tissue findings. IMPRESSION: 1. Mildly displaced fracture of the left fibular head and neck. 2. Comminuted displaced fracture of the distal tibial shaft. 3. No fracture or dislocation of the left ankle. Electronically Signed   By: Larose Hires D.O.   On: 04/20/2022 23:39   DG Ankle 2 Views Left  Result Date: 04/20/2022 CLINICAL DATA:  Fracture.  Fall yesterday at facility. EXAM: LEFT KNEE - 1-2 VIEW; LEFT TIBIA AND FIBULA - 2 VIEW; LEFT ANKLE - 2 VIEW COMPARISON:  None  Available. FINDINGS: Left knee/tibia/fibula: There is mildly displaced fracture of the left fibular head and neck. No appreciable fracture of the distal femur, patella tibia. No appreciable joint effusion. There is comminuted displaced fracture of the distal tibial shaft with a proximally 1 cortex width lateral displacement of the distal fracture fragment. Soft tissue swelling about the distal leg. Left ankle: No fracture or dislocation no significant soft tissue findings. IMPRESSION: 1. Mildly displaced fracture of the left fibular head and neck. 2. Comminuted displaced fracture of the distal tibial shaft. 3. No fracture or dislocation of the left ankle. Electronically Signed   By: Larose Hires D.O.   On: 04/20/2022 23:39        Scheduled Meds:  acetaminophen  500 mg Oral Q6H   amoxicillin-clavulanate  1 tablet Oral BID   calcium carbonate  1 tablet Oral TID   vitamin D3  1,000 Units Oral Daily   dexamethasone  2 mg Oral Daily   Followed by   Melene Muller ON 04/27/2022] dexamethasone  2 mg Oral QODAY   docusate sodium  100 mg Oral BID   doxycycline  100 mg Oral BID   enoxaparin (LOVENOX) injection  40 mg Subcutaneous Q24H   famotidine  10 mg Oral BID   feeding supplement  237 mL Oral TID BM   feeding supplement  237 mL Oral BID BM   fentaNYL  1 patch Transdermal Q72H   fluconazole  200 mg Oral Daily   furosemide  20 mg Oral Daily   gabapentin  300 mg Oral BID   levothyroxine  50 mcg Oral Q0600   liothyronine  25 mcg Oral Q0600   lipase/protease/amylase  24,000 Units Oral TID WC   melatonin  5 mg Oral QHS   metoprolol tartrate  12.5 mg Oral BID   multivitamin with minerals  1 tablet Oral BID   pantoprazole  40 mg Oral BID AC   PHENobarbital  16.2 mg Oral QHS   pyridoxine  100 mg Oral Daily   saccharomyces boulardii  250 mg Oral BID   selenium  100 mcg Oral Daily   vitamin A  10,000 Units Oral Q48H   vitamin B-12  100 mcg Oral Daily   vitamin E  400 Units Oral Daily   zinc sulfate  220  mg Oral Daily   Continuous Infusions:   LOS: 1 day    Time spent: 45 minutes spent on chart review, discussion with nursing staff, consultants, updating family and interview/physical exam; more than 50% of that time was spent in counseling and/or coordination of care.    Joseph Art, DO [facility] Available via Epic secure chat 7am-7pm After these hours, please refer to coverage provider listed on [email or web address] 04/22/2022, 10:36 AM

## 2022-04-22 NOTE — TOC Initial Note (Addendum)
Transition of Care [facility]) - Initial/Assessment Note    Patient Details  Name: Carolyn [name] MRN: [id number] Date of Birth: 1955/06/09  [facility]) CM/SW Contact:    Joanne Chars, LCSW Phone Number: 04/22/2022, 1:37 PM  Clinical Narrative:     CSW met with pt regarding DC recommendation for SNF.  Pt reports she was admitted after fall at [facility] where she had been admitted for STR after prior hospitalization at [facility].  Pt is in agreement with plan to return to [facility] at DC to continue STR there.  Pt usually lives at home with husband Timmothy Sours, no services there currently.  Permission given to speak with husband.  Referral sent to [facility] [place].               SNF Auth request submitted in [place].   Expected Discharge Plan: [place] Barriers to Discharge: Continued Medical Work up   Patient Goals and CMS Choice Patient states their goals for this hospitalization and ongoing recovery are:: some semblence of normalcy   Choice offered to / list presented to : Patient (pt was admitted from [place] at [facility], wants to return)      Expected Discharge Plan and Services In-house Referral: Clinical Social Work   Post Acute Care Choice: [place] Living arrangements for the past 2 months: [place]                                      Prior Living Arrangements/Services Living arrangements for the past 2 months: Single Family Home Lives with:: Spouse Patient language and need for interpreter reviewed:: Yes Do you feel safe going back to the place where you live?: Yes      Need for Family Participation in Patient Care: No (Comment) Care giver support system in place?: Yes (comment) Current home services: Other (comment) (none) Criminal Activity/Legal Involvement Pertinent to Current Situation/Hospitalization: No - Comment as needed  Activities of Daily Living Home Assistive Devices/Equipment: Wheelchair,  Environmental consultant (specify type), Hospital bed ADL Screening (condition at time of admission) Patient's cognitive ability adequate to safely complete daily activities?: Yes Is the patient deaf or have difficulty hearing?: No Does the patient have difficulty seeing, even when wearing glasses/contacts?: No Does the patient have difficulty concentrating, remembering, or making decisions?: No Patient able to express need for assistance with ADLs?: Yes Does the patient have difficulty dressing or bathing?: No Independently performs ADLs?: Yes (appropriate for developmental age) Does the patient have difficulty walking or climbing stairs?: Yes Weakness of Legs: Both Weakness of Arms/Hands: Both  Permission Sought/Granted Permission sought to share information with : Family Supports Permission granted to share information with : Yes, Verbal Permission Granted  Share Information with NAME: husband Timmothy Sours  Permission granted to share info w AGENCY: [facility]        Emotional Assessment Appearance:: Appears stated age Attitude/Demeanor/Rapport: Engaged Affect (typically observed): Appropriate, Pleasant Orientation: : Oriented to Self, Oriented to Place, Oriented to  Time, Oriented to Situation Alcohol / Substance Use: Alcohol Use    Admission diagnosis:  Closed displaced comminuted fracture of shaft of left tibia, initial encounter [S82.252A] Other closed fracture of proximal end of left fibula, initial encounter [S82.832A] Displaced comminuted fracture of shaft of left tibia, init [S82.252A] Patient Active Problem List   Diagnosis Date Noted   Displaced comminuted fracture of shaft of left tibia, initial encounter  for closed fracture 04/21/2022   Closed fracture of proximal end of left fibula, initial encounter 04/21/2022   Leukocytosis 04/21/2022   Influenza A 04/06/2022   History of gastric ulcer 03/23/2022   Abnormal loss of weight 03/23/2022   Aspiration pneumonia of both lungs ([place]) 03/23/2022    Acute on chronic diastolic CHF (congestive heart failure) ([place]) 03/23/2022   NSVT (nonsustained ventricular tachycardia) ([place]) 03/20/2022   Localized swelling of right upper extremity 03/19/2022   Cellulitis of left lower extremity 03/17/2022   Hypophosphatemia 03/17/2022   Anemia of chronic disease 03/17/2022   Sepsis ([place]) 03/14/2022   Severe sepsis ([place]) 03/13/2022   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   Abnormal CT of the abdomen 01/19/2022   Chronic gastric ulcer with perforation ([place]) 32/99/2426   Alcoholic cirrhosis of liver without ascites ([place]) 01/18/2022   Occult blood in stools 01/18/2022   Portal hypertensive gastropathy ([place]) 01/18/2022   Anasarca 01/12/2022   Acute respiratory failure with hypoxia ([place]) 01/11/2022   Hypoalbuminemia 01/11/2022   Pleural effusion 01/10/2022   Abdominal distention 01/10/2022   Physical deconditioning 01/09/2022   Pressure injury of skin 01/08/2022   Hypokalemia 01/07/2022   Hypomagnesemia 01/07/2022   AKI (acute kidney injury) ([place]) 01/07/2022   Severe protein-calorie malnutrition ([place]) 01/07/2022   Cellulitis of right foot 01/07/2022   Normocytic anemia 01/07/2022   Osteomyelitis ([place]) 01/06/2022   Intractable nausea and vomiting 83/41/9622   Metabolic acidosis, increased anion gap 06/12/2020   Seizure ([place]) 05/08/2017   Chronic pain syndrome 05/08/2017   Gastroesophageal reflux disease without esophagitis 05/08/2017   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[place]) 05/08/2017   Pancreatic steatorrhea 05/08/2017   Osteoporosis 03/16/2011   Endometriosis    Malabsorption    Chronic pancreatitis ([place])    Vitamin D deficiency    PCP:  Pcp, No Pharmacy:   [facility] [place], [place] - [address] [place] [address] Phone: [phone] Fax: [phone]  [id number] - [place] [address] Phone:  [phone] Fax: [phone]  [id number] - [place], [place]. AT [address] [place] [address] Phone: [phone] Fax: [phone]     Social Determinants of Health (SDOH) Social History: [place]: No Food Insecurity (04/21/2022)  Housing: Low Risk  (04/21/2022)  Transportation Needs: No Transportation Needs (04/21/2022)  Utilities: Not At Risk (04/21/2022)  Recent Concern: Utilities - At Risk (03/15/2022)  Depression (PHQ2-9): Low Risk  (07/22/2020)  Tobacco Use: Low Risk  (04/22/2022)   SDOH Interventions:     Readmission Risk Interventions     No data to display

## 2022-04-22 NOTE — NC FL2 (Signed)
[place] LEVEL OF CARE FORM     IDENTIFICATION  Patient Name: Carolyn [name] Birthdate: Nov 20, 1955 Sex: female Admission Date (Current Location): 04/20/2022  [facility] and [place] Number:  Herbalist and Address:  [facility] [place]. [facility], [place] [address], [place], [place] [address]      Provider Number: [id number]  Attending Physician Name and Address:  Geradine Girt, DO  Relative Name and Phone Number:  Artis Flock   [phone]    Current Level of Care: SNF Recommended Level of Care: [place] Prior Approval Number:    Date Approved/Denied:   PASRR Number: [id number]  Discharge Plan: SNF    Current Diagnoses: Patient Active Problem List   Diagnosis Date Noted   Displaced comminuted fracture of shaft of left tibia, initial encounter for closed fracture 04/21/2022   Closed fracture of proximal end of left fibula, initial encounter 04/21/2022   Leukocytosis 04/21/2022   Influenza A 04/06/2022   History of gastric ulcer 03/23/2022   Abnormal loss of weight 03/23/2022   Aspiration pneumonia of both lungs ([place]) 03/23/2022   Acute on chronic diastolic CHF (congestive heart failure) ([place]) 03/23/2022   NSVT (nonsustained ventricular tachycardia) ([place]) 03/20/2022   Localized swelling of right upper extremity 03/19/2022   Cellulitis of left lower extremity 03/17/2022   Hypophosphatemia 03/17/2022   Anemia of chronic disease 03/17/2022   Sepsis ([place]) 03/14/2022   Severe sepsis ([place]) 03/13/2022   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   Abnormal CT of the abdomen 01/19/2022   Chronic gastric ulcer with perforation ([place]) 32/95/1884   Alcoholic cirrhosis of liver without ascites ([place]) 01/18/2022   Occult blood in stools 01/18/2022   Portal hypertensive gastropathy ([place]) 01/18/2022   Anasarca 01/12/2022   Acute respiratory failure with hypoxia ([place]) 01/11/2022   Hypoalbuminemia 01/11/2022   Pleural effusion 01/10/2022    Abdominal distention 01/10/2022   Physical deconditioning 01/09/2022   Pressure injury of skin 01/08/2022   Hypokalemia 01/07/2022   Hypomagnesemia 01/07/2022   AKI (acute kidney injury) ([place]) 01/07/2022   Severe protein-calorie malnutrition (HCC) 01/07/2022   Cellulitis of right foot 01/07/2022   Normocytic anemia 01/07/2022   Osteomyelitis ([place]) 01/06/2022   Intractable nausea and vomiting 16/60/6301   Metabolic acidosis, increased anion gap 06/12/2020   Seizure ([place]) 05/08/2017   Chronic pain syndrome 05/08/2017   Gastroesophageal reflux disease without esophagitis 05/08/2017   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[place]) 05/08/2017   Pancreatic steatorrhea 05/08/2017   Osteoporosis 03/16/2011   Endometriosis    Malabsorption    Chronic pancreatitis (HCC)    Vitamin D deficiency     Orientation RESPIRATION BLADDER Height & Weight     Self, Time, Situation, Place  Normal Incontinent Weight: 92 lb 9.5 oz (42 kg) Height:  5\' 8"  (172.7 cm)  BEHAVIORAL SYMPTOMS/MOOD NEUROLOGICAL BOWEL NUTRITION STATUS    Convulsions/Seizures Incontinent Diet (see discharge summary)  AMBULATORY STATUS COMMUNICATION OF NEEDS Skin   Limited Assist Verbally Surgical wounds (redness, blister)                       Personal Care Assistance Level of Assistance  Dressing, Feeding, Bathing Bathing Assistance: Limited assistance Feeding assistance: Independent Dressing Assistance: Limited assistance     Functional Limitations Info  Sight, Hearing, Speech Sight Info: Adequate Hearing Info: Adequate Speech Info: Adequate    SPECIAL CARE FACTORS FREQUENCY  PT (By licensed PT), OT (By licensed OT)     PT Frequency: 5x week OT  Frequency: 5x week            Contractures Contractures Info: Not present    Additional Factors Info  Code Status, Allergies Code Status Info: DNR Allergies Info: Peanut-containing Drug Products           Current Medications (04/22/2022):  This is the current  hospital active medication list Current Facility-Administered Medications  Medication Dose Route Frequency Provider Last Rate Last Admin   acetaminophen (TYLENOL) tablet 325-650 mg  325-650 mg Oral Q6H PRN Marybelle Killings, MD       acetaminophen (TYLENOL) tablet 500 mg  500 mg Oral Q6H Marybelle Killings, MD   500 mg at 04/22/22 0622   amoxicillin-clavulanate (AUGMENTIN) 875-125 MG per tablet 1 tablet  1 tablet Oral BID Lenore Cordia, MD   1 tablet at 04/22/22 1324   calcium carbonate (TUMS - dosed in mg elemental calcium) chewable tablet 200 mg of elemental calcium  1 tablet Oral TID Vann, Jessica U, DO   200 mg of elemental calcium at 04/22/22 1209   cholecalciferol (VITAMIN D3) 25 MCG (1000 UNIT) tablet 1,000 Units  1,000 Units Oral Daily Lenore Cordia, MD   1,000 Units at 04/22/22 4010   dexamethasone (DECADRON) tablet 2 mg  2 mg Oral Daily Eulogio Bear U, DO   2 mg at 04/22/22 2725   Followed by   Derrill Memo ON 04/27/2022] dexamethasone (DECADRON) tablet 2 mg  2 mg Oral QODAY Vann, Jessica U, DO       docusate sodium (COLACE) capsule 100 mg  100 mg Oral BID Marybelle Killings, MD   100 mg at 04/22/22 3664   doxycycline (VIBRA-TABS) tablet 100 mg  100 mg Oral BID Zada Finders R, MD   100 mg at 04/22/22 0906   [START ON 04/23/2022] enoxaparin (LOVENOX) injection 30 mg  30 mg Subcutaneous Q24H Hammons, Kimberly B, RPH       famotidine (PEPCID) tablet 10 mg  10 mg Oral BID Vann, Jessica U, DO   10 mg at 04/22/22 4034   feeding supplement (ENSURE ENLIVE / ENSURE PLUS) liquid 237 mL  237 mL Oral TID BM Zada Finders R, MD   237 mL at 04/22/22 1208   fentaNYL (DURAGESIC) 50 MCG/HR 1 patch  1 patch Transdermal Q72H Zada Finders R, MD   1 patch at 04/22/22 0914   fluconazole (DIFLUCAN) tablet 200 mg  200 mg Oral Daily Eulogio Bear U, DO   200 mg at 04/22/22 0908   furosemide (LASIX) tablet 20 mg  20 mg Oral Daily Vann, Jessica U, DO       gabapentin (NEURONTIN) capsule 300 mg  300 mg Oral BID Zada Finders R, MD    300 mg at 04/22/22 0907   HYDROcodone-acetaminophen (NORCO) 7.5-325 MG per tablet 1-2 tablet  1-2 tablet Oral Q4H PRN Marybelle Killings, MD   2 tablet at 04/22/22 1209   HYDROcodone-acetaminophen (NORCO/VICODIN) 5-325 MG per tablet 1-2 tablet  1-2 tablet Oral Q4H PRN Marybelle Killings, MD       HYDROmorphone (DILAUDID) tablet 2 mg  2 mg Oral Q4H PRN Zada Finders R, MD   2 mg at 04/21/22 1003   levothyroxine (SYNTHROID) tablet 50 mcg  50 mcg Oral Q0600 Lenore Cordia, MD   50 mcg at 04/22/22 0622   liothyronine (CYTOMEL) tablet 25 mcg  25 mcg Oral Q0600 Lenore Cordia, MD   25 mcg at 04/22/22 7425   lipase/protease/amylase (CREON) capsule 24,000 Units  24,000 Units Oral TID WC Lenore Cordia, MD   24,000 Units at 04/22/22 1209   loperamide (IMODIUM) capsule 2 mg  2 mg Oral Q6H PRN Lenore Cordia, MD       melatonin tablet 5 mg  5 mg Oral QHS Zada Finders R, MD   5 mg at 04/21/22 2114   methocarbamol (ROBAXIN) tablet 500 mg  500 mg Oral Q8H PRN Marybelle Killings, MD   500 mg at 04/22/22 7035   metoCLOPramide (REGLAN) tablet 5-10 mg  5-10 mg Oral Q8H PRN Marybelle Killings, MD       Or   metoCLOPramide (REGLAN) injection 5-10 mg  5-10 mg Intravenous Q8H PRN Marybelle Killings, MD       metoprolol tartrate (LOPRESSOR) tablet 12.5 mg  12.5 mg Oral BID Vann, Jessica U, DO       morphine (PF) 2 MG/ML injection 0.5 mg  0.5 mg Intravenous Q3H PRN Zada Finders R, MD   0.5 mg at 04/21/22 0549   morphine (PF) 2 MG/ML injection 0.5-1 mg  0.5-1 mg Intravenous Q2H PRN Marybelle Killings, MD   1 mg at 04/22/22 1326   multivitamin with minerals tablet 1 tablet  1 tablet Oral BID Eulogio Bear U, DO   1 tablet at 04/22/22 0906   ondansetron (ZOFRAN) tablet 4 mg  4 mg Oral Q6H PRN Marybelle Killings, MD       Or   ondansetron [facility]) injection 4 mg  4 mg Intravenous Q6H PRN Marybelle Killings, MD       pantoprazole (PROTONIX) EC tablet 40 mg  40 mg Oral BID AC Zada Finders R, MD   40 mg at 04/22/22 0906   PHENobarbital (LUMINAL) tablet  16.2 mg  16.2 mg Oral QHS Zada Finders R, MD   16.2 mg at 04/21/22 2115   polyethylene glycol (MIRALAX / GLYCOLAX) packet 17 g  17 g Oral BID PRN Lenore Cordia, MD       pyridOXINE (VITAMIN B6) tablet 100 mg  100 mg Oral Daily Zada Finders R, MD   100 mg at 04/22/22 0093   saccharomyces boulardii (FLORASTOR) capsule 250 mg  250 mg Oral BID Eulogio Bear U, DO   250 mg at 04/22/22 8182   selenium tablet 100 mcg  100 mcg Oral Daily Zada Finders R, MD   100 mcg at 04/22/22 0908   senna-docusate (Senokot-S) tablet 1 tablet  1 tablet Oral QHS PRN Lenore Cordia, MD       vitamin A capsule 10,000 Units  10,000 Units Oral Q48H Lenore Cordia, MD   10,000 Units at 04/22/22 9937   vitamin B-12 (CYANOCOBALAMIN) tablet 100 mcg  100 mcg Oral Daily Zada Finders R, MD   100 mcg at 04/22/22 0908   vitamin E capsule 400 Units  400 Units Oral Daily Lenore Cordia, MD   400 Units at 04/22/22 1696   zinc sulfate capsule 220 mg  220 mg Oral Daily Lenore Cordia, MD   220 mg at 04/22/22 7893     Discharge Medications: Please see discharge summary for a list of discharge medications.  Relevant Imaging Results:  Relevant Lab Results:   Additional Information SSN [id number]  Joanne Chars, LCSW

## 2022-04-23 DIAGNOSIS — S82252A Displaced comminuted fracture of shaft of left tibia, initial encounter for closed fracture: Secondary | ICD-10-CM | POA: Diagnosis not present

## 2022-04-23 LAB — CBC
HCT: 23.3 % — ABNORMAL LOW (ref 36.0–46.0)
Hemoglobin: 7.6 g/dL — ABNORMAL LOW (ref 12.0–15.0)
MCH: 31 pg (ref 26.0–34.0)
MCHC: 32.6 g/dL (ref 30.0–36.0)
MCV: 95.1 fL (ref 80.0–100.0)
Platelets: 154 10*3/uL (ref 150–400)
RBC: 2.45 MIL/uL — ABNORMAL LOW (ref 3.87–5.11)
RDW: 15.1 % (ref 11.5–15.5)
WBC: 14.9 10*3/uL — ABNORMAL HIGH (ref 4.0–10.5)
nRBC: 0 % (ref 0.0–0.2)

## 2022-04-23 LAB — BASIC METABOLIC PANEL
Anion gap: 12 (ref 5–15)
BUN: 23 mg/dL (ref 8–23)
CO2: 22 mmol/L (ref 22–32)
Calcium: 8.5 mg/dL — ABNORMAL LOW (ref 8.9–10.3)
Chloride: 98 mmol/L (ref 98–111)
Creatinine, Ser: 0.57 mg/dL (ref 0.44–1.00)
GFR, Estimated: >60 mL/min (ref >60)
Glucose, Bld: 167 mg/dL — ABNORMAL HIGH (ref 70–99)
Potassium: 4.3 mmol/L (ref 3.5–5.1)
Sodium: 132 mmol/L — ABNORMAL LOW (ref 135–145)

## 2022-04-23 LAB — HEMOGLOBIN AND HEMATOCRIT, BLOOD
HCT: 24.6 % — ABNORMAL LOW (ref 36.0–46.0)
Hemoglobin: 8.4 g/dL — ABNORMAL LOW (ref 12.0–15.0)

## 2022-04-23 MED ORDER — FENTANYL 50 MCG/HR TD PT72: 1.0000 | MEDICATED_PATCH | TRANSDERMAL | 0 refills | Status: DC

## 2022-04-23 MED ORDER — HYDROCODONE-ACETAMINOPHEN 5-325 MG PO TABS: 1.0000 | ORAL_TABLET | ORAL | 0 refills | Status: DC | PRN

## 2022-04-23 MED ORDER — METOPROLOL TARTRATE 25 MG PO TABS: 12.5000 mg | ORAL_TABLET | Freq: Two times a day (BID) | ORAL | Status: DC

## 2022-04-23 MED ORDER — RISAQUAD PO CAPS: 1.0000 | ORAL_CAPSULE | Freq: Every day | ORAL | Status: DC

## 2022-04-23 MED ORDER — METHOCARBAMOL 500 MG PO TABS: 500.0000 mg | ORAL_TABLET | Freq: Four times a day (QID) | ORAL | Status: DC | PRN

## 2022-04-23 MED ORDER — FUROSEMIDE 20 MG PO TABS: 20.0000 mg | ORAL_TABLET | Freq: Every day | ORAL | Status: DC

## 2022-04-23 MED ORDER — HYDROMORPHONE HCL 2 MG PO TABS: 2.0000 mg | ORAL_TABLET | ORAL | 0 refills | Status: DC | PRN

## 2022-04-23 NOTE — Progress Notes (Addendum)
Patient discharged to [facility] via private transport. Husband escorted patient to receiving facility. AVS summary, DNR, and prescriptions given to patient and husband to given to the facility. Report called and given to receiving nurse at receiving facility.

## 2022-04-23 NOTE — Progress Notes (Signed)
Mobility Specialist Progress Note    04/23/22 1117  Mobility  Activity Ambulated with assistance in hallway  Level of Assistance +2 (takes two people) (chair follow)  Assistive Device Front wheel walker  Distance Ambulated (ft) 95 ft  LLE Weight Bearing WBAT  Activity Response Tolerated well  Mobility Referral Yes  $Mobility charge 1 Mobility   Pt received in chair and agreeable. MinA to stand. No complaints on walk. Rolled back and left in chair with call bell in reach.    Hildred Alamin Mobility Specialist  Please Psychologist, sport and exercise or Rehab Office at [phone]

## 2022-04-23 NOTE — TOC Transition Note (Signed)
Transition of Care [facility]) - CM/SW Discharge Note   Patient Details  Name: Carolyn [name] MRN: [id number] Date of Birth: April 15, 1955  [facility]) CM/SW Contact:  Joanne Chars, LCSW Phone Number: 04/23/2022, 12:07 PM   Clinical Narrative:   Pt discharging to [place].  RN call report to [phone].  (Main number [phone])    Final next level of care: [place] Barriers to Discharge: Barriers Resolved   Patient Goals and CMS Choice   Choice offered to / list presented to : Patient (pt was admitted from [place] at [facility]: [place] Rhonda W, wants to return)  Discharge Placement                Patient chooses bed at:  [facility]) Patient to be transferred to facility by: husband Name of family member notified: husband Don Patient and family notified of of transfer: 04/23/22  Discharge Plan and Services Additional resources added to the After Visit Summary for   In-house Referral: Clinical Social Work   Post Acute Care Choice: [place]                               Social Determinants of Health ([place]) Interventions SDOH Screenings   Food Insecurity: No Food Insecurity (04/21/2022)  Housing: Low Risk  (04/21/2022)  Transportation Needs: No Transportation Needs (04/21/2022)  Utilities: Not At Risk (04/21/2022)  Recent Concern: Utilities - At Risk (03/15/2022)  Depression (PHQ2-9): Low Risk  (07/22/2020)  Tobacco Use: Low Risk  (04/22/2022)     Readmission Risk Interventions     No data to display

## 2022-04-23 NOTE — TOC Progression Note (Addendum)
Transition of Care [facility]) - Progression Note    Patient Details  Name: Carolyn [name] MRN: [id number] Date of Birth: [birth year]  Transition of [facility]) CM/SW Contact  Joanne Chars, LCSW Phone Number: 04/23/2022, 8:36 AM  Clinical Narrative:   SNF auth approved in [place]: [id number], [id number], 5 days: 2/9-2/13.  MD notified.  CSW confirmed with [facility] that they can receive pt today.  CSW spoke with pt and then with husband Timmothy Sours about providing transportation.  They confirm that husband can transport with assistance getting pt in and out.  RN and [facility] informed.    Expected Discharge Plan: [place] Barriers to Discharge: Continued Medical Work up  Expected Discharge Plan and Services In-house Referral: Clinical Social Work   Post Acute Care Choice: [place] Living arrangements for the past 2 months: Single Family Home                                       Social Determinants of Health (SDOH) Interventions SDOH Screenings   Food Insecurity: No Food Insecurity (04/21/2022)  Housing: Low Risk  (04/21/2022)  Transportation Needs: No Transportation Needs (04/21/2022)  Utilities: Not At Risk (04/21/2022)  Recent Concern: Utilities - At Risk (03/15/2022)  Depression (PHQ2-9): Low Risk  (07/22/2020)  Tobacco Use: Low Risk  (04/22/2022)    Readmission Risk Interventions     No data to display

## 2022-04-23 NOTE — Progress Notes (Signed)
Patient ID: Carolyn [name], female   DOB: 03-29-1955, 67 y.o.   MRN: [id number]   Subjective: 2 Days Post-Op Procedure(s) (LRB): LEFT INTRAMEDULLARY (IM) NAIL TIBIAL (Left) Patient reports pain as mild and moderate.    Objective: Vital signs in last 24 hours: Temp:  [98.1 F (36.7 C)-98.3 F (36.8 C)] 98.2 F (36.8 C) (02/09 1320) Pulse Rate:  [89-109] 100 (02/09 1320) Resp:  [17-18] 18 (02/09 1320) BP: (114-142)/(71-80) 130/71 (02/09 1320) SpO2:  [97 %-100 %] 97 % (02/09 1320)  Intake/Output from previous day: 02/08 0701 - 02/09 0700 In: 1300 [P.O.:1300] Out: -  Intake/Output this shift: Total I/O In: 240 [P.O.:240] Out: -   Recent Labs    04/20/22 2300 04/21/22 0448 04/23/22 0314 04/23/22 0839  HGB 8.4* 9.2* 7.6* 8.4*   Recent Labs    04/21/22 0448 04/23/22 0314 04/23/22 0839  WBC 15.6* 14.9*  --   RBC 2.87* 2.45*  --   HCT 27.1* 23.3* 24.6*  PLT 182 154  --    Recent Labs    04/21/22 0448 04/23/22 0314  NA 133* 132*  K 3.7 4.3  CL 99 98  CO2 26 22  BUN 35* 23  CREATININE 0.73 0.57  GLUCOSE 73 167*  CALCIUM 8.8* 8.5*   No results for input(s): "LABPT", "INR" in the last 72 hours.  Minimal pain with WB with PT , now in recliner. Using walker No results found.  Assessment/Plan: 2 Days Post-Op Procedure(s) (LRB): LEFT INTRAMEDULLARY (IM) NAIL TIBIAL (Left) Up with therapy.  Return to SNF. Office 2 wks.   Marybelle Killings 04/23/2022, 1:26 PM

## 2022-04-23 NOTE — Discharge Summary (Signed)
Physician Discharge Summary  Carolyn [name] [id number] DOB: 1955-12-16 DOA: 04/20/2022  PCP: Merryl Hacker, No  Admit date: 04/20/2022 Discharge date: 04/23/2022  Admitted From: SNF Discharge disposition: SNF   Recommendations for Outpatient Follow-Up:  Monitor blood sugars WBAT -Continue Augmentin and doxycycline per facility   Discharge Diagnosis:   Principal Problem:   Displaced comminuted fracture of shaft of left tibia, initial encounter for closed fracture Active Problems:   Closed fracture of proximal end of left fibula, initial encounter   Chronic pain syndrome   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[facility])   Alcoholic cirrhosis of liver without ascites ([facility])   Chronic gastric ulcer with perforation ([facility])   Anemia of chronic disease   Leukocytosis    Discharge Condition: Improved.  Diet recommendation:.  Regular.  Wound care: Silicone foam dressings to the right medial knee wound, change every 3 days. Assess under dressings each shift for any acute changes in the wounds.   Code status: DNR   History of Present Illness:   Carolyn [name] is a 67 y.o. female with medical history significant for alcoholism now in remission complicated by chronic alcoholic pancreatitis with malabsorption, chronic gastric ulcer with contained perforation, hepatic cirrhosis with portal hypertensive gastropathy, anemia of chronic disease and folate deficiency, hypothyroidism, protein and nutritional deficiencies who presented to the ED from SNF for evaluation of left fibula and tibia fractures after a mechanical fall.   Patient with recent prolonged admission from 03/13/2022-04/12/2022.  Initially presented with severe sepsis due to cellulitis and desquamative dermatitis.  This was felt secondary to severe protein calorie malnutrition and multiple nutritional deficiencies (selenium, vitamin A, B6, E, folate).  She completed initial 10-day course of antibiotics and was started on nutritional  supplements as well as TPN.  She was seen by palliative care for management of uncontrolled pain and placed on a Decadron taper in addition to narcotics.  She underwent celiac block by IR on 04/06/2022.   Hospitalization was complicated by hypoxic respiratory failure due to acute on chronic HFpEF, influenza A, and pneumonia.  She was also noted to have acute on chronic anemia requiring 3 unit PRBC transfusion.  She was seen by GI who recommended against further endoscopic evaluation at that time.   Patient was discharged to SNF.  Patient has been ambulating with the use of a walker.  Husband states that she has been able to walk up to 300 feet fairly well.  Her desquamative rashes have healed well.  She does have contact pressure sores involving her medial ankles and knees for which she was seen by wound care and required debridement yesterday.  She was apparently started on Augmentin and doxycycline due to concern for associated infection.   Yesterday husband was helping transport patient from the common dining area in her wheelchair to the bathroom.  When she was standing up she felt pain at her left lower leg above the ankle when she was bearing weight.  Her legs then gave out and she fell to the ground.  She has been unable to stand afterwards.   An x-ray was obtained at her facility which showed an acute nondisplaced fracture of the left fibular head and distal shaft of tibia.  She was sent to the ED for further evaluation.   Of note, patient has been on a steroid taper since discharge from hospital, initially Decadron 4 mg daily for 1 week.  Currently on 2 mg daily for 1 week with plan to change to every  other day for 1 week then stop.   Hospital Course by Problem:   Comminuted displaced fracture of distal tibial shaft Displaced fracture of the left fibular head and neck: -s/p surgical repair -PT/OT eval pending- SNF -WBAT   Anemia of chronic disease: -hgb stable   Leukocytosis: Likely  reactive in setting of acute fractures.  Also was started on Augmentin and doxycycline for presumed pressure sore wound infection at her facility. -Continue Augmentin and doxycycline per facility   Chronic alcoholic pancreatitis with malabsorption: Continue Creon.   Chronic gastric ulcer with contained perforation: Continue Protonix.   Hepatic cirrhosis with portal hypertensive gastropathy: Appears compensated without ascites.   Hypothyroidism: Continue Synthroid.   HFpEF: No sign of volume overload, she is malnourished with muscle wasting throughout. -reduced dose of lasix/lopressor   Protein calorie malnutrition/nutritional deficiencies: -Consult to dietitian -Continue feeding supplement -Continue B6, selenium, vitamin A, B12, vitamin D3, vitamin E, zinc supplements   Contact pressure sores: - Continue Augmentin and doxycycline.   Chronic pain: -adjust meds to control pain -muscle relaxer   Anxiety: Continue Cytomel and phenobarbital.      Medical Consultants:   ortho   Discharge Exam:   Vitals:   04/23/22 0349 04/23/22 0716  BP: 114/80 130/80  Pulse: 89 92  Resp: 18 17  Temp: 98.3 F (36.8 C) 98.1 F (36.7 C)  SpO2: 100% 100%   Vitals:   04/22/22 1304 04/22/22 1958 04/23/22 0349 04/23/22 0716  BP: 115/71 (!) 142/73 114/80 130/80  Pulse: 100 (!) 109 89 92  Resp: 16 18 18 17  $ Temp: 98.1 F (36.7 C) 98.1 F (36.7 C) 98.3 F (36.8 C) 98.1 F (36.7 C)  TempSrc: Oral Oral Oral Oral  SpO2: 97% 98% 100% 100%  Weight:      Height:        General exam: Appears calm and comfortable.    The results of significant diagnostics from this hospitalization (including imaging, microbiology, ancillary and laboratory) are listed below for reference.     Procedures and Diagnostic Studies:   DG Tibia/Fibula Left  Result Date: 04/21/2022 CLINICAL DATA:  Left tibial fracture. EXAM: LEFT TIBIA AND FIBULA - 2 VIEW COMPARISON:  04/20/2022 FINDINGS: Intraoperative  fluoroscopic images demonstrate placement of an intramedullary nail in the left tibia. Reduction of the distal tibial fracture with near anatomic alignment. Proximal fibular fracture. IMPRESSION: Internal fixation of left tibial fracture. Electronically Signed   By: Markus Daft M.D.   On: 04/21/2022 15:50   DG C-Arm 1-60 Min-No Report  Result Date: 04/21/2022 Fluoroscopy was utilized by the requesting physician.  No radiographic interpretation.   DG C-Arm 1-60 Min-No Report  Result Date: 04/21/2022 Fluoroscopy was utilized by the requesting physician.  No radiographic interpretation.   DG Tibia/Fibula Left  Result Date: 04/20/2022 CLINICAL DATA:  Fracture.  Fall yesterday at facility. EXAM: LEFT KNEE - 1-2 VIEW; LEFT TIBIA AND FIBULA - 2 VIEW; LEFT ANKLE - 2 VIEW COMPARISON:  None Available. FINDINGS: Left knee/tibia/fibula: There is mildly displaced fracture of the left fibular head and neck. No appreciable fracture of the distal femur, patella tibia. No appreciable joint effusion. There is comminuted displaced fracture of the distal tibial shaft with a proximally 1 cortex width lateral displacement of the distal fracture fragment. Soft tissue swelling about the distal leg. Left ankle: No fracture or dislocation no significant soft tissue findings. IMPRESSION: 1. Mildly displaced fracture of the left fibular head and neck. 2. Comminuted displaced fracture of the distal tibial shaft.  3. No fracture or dislocation of the left ankle. Electronically Signed   By: Keane Police D.O.   On: 04/20/2022 23:39   DG Knee 2 Views Left  Result Date: 04/20/2022 CLINICAL DATA:  Fracture.  Fall yesterday at facility. EXAM: LEFT KNEE - 1-2 VIEW; LEFT TIBIA AND FIBULA - 2 VIEW; LEFT ANKLE - 2 VIEW COMPARISON:  None Available. FINDINGS: Left knee/tibia/fibula: There is mildly displaced fracture of the left fibular head and neck. No appreciable fracture of the distal femur, patella tibia. No appreciable joint effusion. There  is comminuted displaced fracture of the distal tibial shaft with a proximally 1 cortex width lateral displacement of the distal fracture fragment. Soft tissue swelling about the distal leg. Left ankle: No fracture or dislocation no significant soft tissue findings. IMPRESSION: 1. Mildly displaced fracture of the left fibular head and neck. 2. Comminuted displaced fracture of the distal tibial shaft. 3. No fracture or dislocation of the left ankle. Electronically Signed   By: Keane Police D.O.   On: 04/20/2022 23:39   DG Ankle 2 Views Left  Result Date: 04/20/2022 CLINICAL DATA:  Fracture.  Fall yesterday at facility. EXAM: LEFT KNEE - 1-2 VIEW; LEFT TIBIA AND FIBULA - 2 VIEW; LEFT ANKLE - 2 VIEW COMPARISON:  None Available. FINDINGS: Left knee/tibia/fibula: There is mildly displaced fracture of the left fibular head and neck. No appreciable fracture of the distal femur, patella tibia. No appreciable joint effusion. There is comminuted displaced fracture of the distal tibial shaft with a proximally 1 cortex width lateral displacement of the distal fracture fragment. Soft tissue swelling about the distal leg. Left ankle: No fracture or dislocation no significant soft tissue findings. IMPRESSION: 1. Mildly displaced fracture of the left fibular head and neck. 2. Comminuted displaced fracture of the distal tibial shaft. 3. No fracture or dislocation of the left ankle. Electronically Signed   By: Keane Police D.O.   On: 04/20/2022 23:39     Labs:   Basic Metabolic Panel: Recent Labs  Lab 04/20/22 2300 04/21/22 0448 04/23/22 0314  NA 132* 133* 132*  K 3.9 3.7 4.3  CL 100 99 98  CO2 25 26 22  $ GLUCOSE 76 73 167*  BUN 42* 35* 23  CREATININE 0.93 0.73 0.57  CALCIUM 8.5* 8.8* 8.5*   GFR Estimated Creatinine Clearance: 47.1 mL/min (by C-G formula based on SCr of 0.57 mg/dL). Liver Function Tests: Recent Labs  Lab 04/21/22 0448  AST 35  ALT 46*  ALKPHOS 91  BILITOT 0.5  PROT 6.0*  ALBUMIN 3.2*    No results for input(s): "LIPASE", "AMYLASE" in the last 168 hours. No results for input(s): "AMMONIA" in the last 168 hours. Coagulation profile No results for input(s): "INR", "PROTIME" in the last 168 hours.  CBC: Recent Labs  Lab 04/20/22 2300 04/21/22 0448 04/23/22 0314 04/23/22 0839  WBC 19.6* 15.6* 14.9*  --   NEUTROABS 14.3*  --   --   --   HGB 8.4* 9.2* 7.6* 8.4*  HCT 25.9* 27.1* 23.3* 24.6*  MCV 95.6 94.4 95.1  --   PLT 178 182 154  --    Cardiac Enzymes: No results for input(s): "CKTOTAL", "CKMB", "CKMBINDEX", "TROPONINI" in the last 168 hours. BNP: Invalid input(s): "POCBNP" CBG: No results for input(s): "GLUCAP" in the last 168 hours. D-Dimer No results for input(s): "DDIMER" in the last 72 hours. Hgb A1c No results for input(s): "HGBA1C" in the last 72 hours. Lipid Profile No results for input(s): "CHOL", "HDL", "  [place]", "TRIG", "CHOLHDL", "LDLDIRECT" in the last 72 hours. Thyroid function studies No results for input(s): "TSH", "T4TOTAL", "T3FREE", "THYROIDAB" in the last 72 hours.  Invalid input(s): "FREET3" Anemia work up No results for input(s): "VITAMINB12", "FOLATE", "FERRITIN", "TIBC", "IRON", "RETICCTPCT" in the last 72 hours. Microbiology Recent Results (from the past 240 hour(s))  Resp panel by RT-PCR (RSV, Flu A&B, Covid) Anterior Nasal Swab     Status: Abnormal   Collection Time: 04/21/22  1:07 AM   Specimen: Anterior Nasal Swab  Result Value Ref Range Status   SARS Coronavirus 2 by RT PCR NEGATIVE NEGATIVE Final    Comment: (NOTE) SARS-CoV-2 target nucleic acids are NOT DETECTED.  The SARS-CoV-2 RNA is generally detectable in upper respiratory specimens during the acute phase of infection. The lowest concentration of SARS-CoV-2 viral copies this assay can detect is 138 copies/mL. A negative result does not preclude SARS-Cov-2 infection and should not be used as the sole basis for treatment or other patient management decisions. A  negative result may occur with  improper specimen collection/handling, submission of specimen other than nasopharyngeal swab, presence of viral mutation(s) within the areas targeted by this assay, and inadequate number of viral copies(<138 copies/mL). A negative result must be combined with clinical observations, patient history, and epidemiological information. The expected result is Negative.  Fact Sheet for Patients:  EntrepreneurPulse.com.au  Fact Sheet for Healthcare Providers:  IncredibleEmployment.be  This test is no t yet approved or cleared by the [place] FDA and  has been authorized for detection and/or diagnosis of SARS-CoV-2 by FDA under an Emergency Use Authorization (EUA). This EUA will remain  in effect (meaning this test can be used) for the duration of the COVID-19 declaration under Section 564(b)(1) of the Act, 21 U.S.C.section 360bbb-3(b)(1), unless the authorization is terminated  or revoked sooner.       Influenza A by PCR POSITIVE (A) NEGATIVE Final   Influenza B by PCR NEGATIVE NEGATIVE Final    Comment: (NOTE) The Xpert Xpress SARS-CoV-2/FLU/RSV plus assay is intended as an aid in the diagnosis of influenza from Nasopharyngeal swab specimens and should not be used as a sole basis for treatment. Nasal washings and aspirates are unacceptable for Xpert Xpress SARS-CoV-2/FLU/RSV testing.  Fact Sheet for Patients: EntrepreneurPulse.com.au  Fact Sheet for Healthcare Providers: IncredibleEmployment.be  This test is not yet approved or cleared by the [place] FDA and has been authorized for detection and/or diagnosis of SARS-CoV-2 by FDA under an Emergency Use Authorization (EUA). This EUA will remain in effect (meaning this test can be used) for the duration of the COVID-19 declaration under Section 564(b)(1) of the Act, 21 U.S.C. section 360bbb-3(b)(1), unless the  authorization is terminated or revoked.     Resp Syncytial Virus by PCR NEGATIVE NEGATIVE Final    Comment: (NOTE) Fact Sheet for Patients: EntrepreneurPulse.com.au  Fact Sheet for Healthcare Providers: IncredibleEmployment.be  This test is not yet approved or cleared by the [place] FDA and has been authorized for detection and/or diagnosis of SARS-CoV-2 by FDA under an Emergency Use Authorization (EUA). This EUA will remain in effect (meaning this test can be used) for the duration of the COVID-19 declaration under Section 564(b)(1) of the Act, 21 U.S.C. section 360bbb-3(b)(1), unless the authorization is terminated or revoked.  Performed at [facility], [place] [address] [place], [place] [address]   MRSA Next Gen by PCR, Nasal     Status: None   Collection Time: 04/21/22  5:00 AM  Specimen: Nasal Mucosa; Nasal Swab  Result Value Ref Range Status   MRSA by PCR Next Gen NOT DETECTED NOT DETECTED Final    Comment: (NOTE) The GeneXpert MRSA Assay (FDA approved for NASAL specimens only), is one component of a comprehensive MRSA colonization surveillance program. It is not intended to diagnose MRSA infection nor to guide or monitor treatment for MRSA infections. Test performance is not FDA approved in patients less than 11 years old. Performed at [facility], [place] [address] [place],  [address]      Discharge Instructions:   Discharge Instructions     Diet general   Complete by: As directed    Discharge wound care:   Complete by: As directed    Silicone foam dressings to the right medial knee wound, change every 3 days. Assess under dressings each shift for any acute changes in the wounds.   Increase activity slowly   Complete by: As directed       Allergies as of 04/23/2022       Reactions   Peanut-containing Drug Products Anaphylaxis   Tree nuts        Medication List     STOP  taking these medications    insulin glargine-yfgn 100 UNIT/ML injection Commonly known as: SEMGLEE   potassium chloride SA 20 MEQ tablet Commonly known as: KLOR-CON M   tiZANidine 2 MG tablet Commonly known as: ZANAFLEX       TAKE these medications    (feeding supplement) PROSource Plus liquid Take 30 mLs by mouth 2 (two) times daily between meals.   feeding supplement Liqd Take 237 mLs by mouth 3 (three) times daily between meals.   acetaminophen 325 MG tablet Commonly known as: TYLENOL Take 2 tablets (650 mg total) by mouth every 4 (four) hours as needed for mild pain (temp > 101.5).   amoxicillin-clavulanate 875-125 MG tablet Commonly known as: AUGMENTIN Take 1 tablet by mouth 2 (two) times daily.   dexamethasone 4 MG tablet Commonly known as: DECADRON Decadron 4 mg daily for 1 week Then 2 mg daily for 1 week Then 2 mg every other day for 1 week and then stop What changed:  how much to take how to take this when to take this additional instructions   doxycycline 100 MG EC tablet Commonly known as: DORYX Take 100 mg by mouth 2 (two) times daily.   famotidine 10 MG tablet Commonly known as: PEPCID Take 10 mg by mouth 2 (two) times daily.   fentaNYL 50 MCG/HR Commonly known as: [place] 1 patch onto the skin every 3 (three) days.   fluconazole 200 MG tablet Commonly known as: DIFLUCAN Take 1 tablet (200 mg total) by mouth daily.   furosemide 20 MG tablet Commonly known as: LASIX Take 1 tablet (20 mg total) by mouth daily. Start taking on: April 24, 2022 What changed:  medication strength how much to take   gabapentin 300 MG capsule Commonly known as: NEURONTIN Take 1 capsule (300 mg total) by mouth 2 (two) times daily.   HYDROcodone-acetaminophen 5-325 MG tablet Commonly known as: NORCO/VICODIN Take 1-2 tablets by mouth every 4 (four) hours as needed for moderate pain.   HYDROmorphone 2 MG tablet Commonly known as: DILAUDID Take 1  tablet (2 mg total) by mouth every 4 (four) hours as needed for severe pain ([facility] Pain). What changed: reasons to take this   insulin aspart 100 UNIT/ML injection Commonly known as: novoLOG Inject 3 Units into the  skin 3 (three) times daily with meals.   levothyroxine 50 MCG tablet Commonly known as: SYNTHROID Take 1 tablet (50 mcg total) by mouth daily at 6 (six) AM.   liothyronine 25 MCG tablet Commonly known as: CYTOMEL Take 1 tablet (25 mcg total) by mouth daily.   lipase/protease/amylase 12000-38000 units Cpep capsule Commonly known as: Creon Take 2 capsules (24,000 Units total) by mouth 3 (three) times daily with meals.   loperamide 2 MG capsule Commonly known as: IMODIUM Take 1 capsule (2 mg total) by mouth every 6 (six) hours as needed for diarrhea or loose stools.   Melatonin 5 MG Caps Take 1 capsule by mouth at bedtime.   methocarbamol 500 MG tablet Commonly known as: ROBAXIN Take 1 tablet (500 mg total) by mouth every 6 (six) hours as needed for muscle spasms.   metoprolol tartrate 25 MG tablet Commonly known as: LOPRESSOR Take 0.5 tablets (12.5 mg total) by mouth 2 (two) times daily. What changed: how much to take   multivitamin with minerals Tabs tablet Take 1 tablet by mouth daily.   ondansetron 4 MG tablet Commonly known as: ZOFRAN Take 4 mg by mouth every 8 (eight) hours as needed for nausea or vomiting.   pantoprazole 40 MG tablet Commonly known as: PROTONIX Take 1 tablet (40 mg total) by mouth 2 (two) times daily before a meal.   phenobarbital 16.2 MG tablet Commonly known as: LUMINAL Take 1 tablet (16.2 mg total) by mouth at bedtime.   polyethylene glycol 17 g packet Commonly known as: MIRALAX / GLYCOLAX Take 17 g by mouth 2 (two) times daily as needed for mild constipation.   pyridoxine 100 MG tablet Commonly known as: B-6 Take 1 tablet (100 mg total) by mouth daily.   saccharomyces boulardii 250 MG capsule Commonly known as:  FLORASTOR Take 1 capsule (250 mg total) by mouth 2 (two) times daily.   Selenium 100 MCG Tabs Take 1 tablet (100 mcg total) by mouth daily.   silver sulfADIAZINE 1 % cream Commonly known as: SILVADENE Apply topically daily. What changed: how much to take   vitamin A 3 MG (10000 UNITS) capsule Take 1 capsule (10,000 Units total) by mouth every other day.   vitamin B-12 100 MCG tablet Commonly known as: CYANOCOBALAMIN Take 100 mcg by mouth daily.   vitamin D3 25 MCG tablet Commonly known as: CHOLECALCIFEROL Take 1 tablet (1,000 Units total) by mouth daily.   vitamin E 180 MG (400 UNITS) capsule Take 1 capsule (400 Units total) by mouth daily.   zinc sulfate 220 (50 Zn) MG capsule Take 1 capsule (220 mg total) by mouth daily.               Discharge Care Instructions  (From admission, onward)           Start     Ordered   04/23/22 0000  Discharge wound care:       Comments: Silicone foam dressings to the right medial knee wound, change every 3 days. Assess under dressings each shift for any acute changes in the wounds.   04/23/22 1143            Follow-up Information     Marybelle Killings, MD Follow up in 2 week(s).   Specialty: Orthopedic Surgery Contact information: [address] [place] [address] [phone]                  Time coordinating discharge: 45 min  Signed:  Janett Billow  U Calen Geister DO  [facility] 04/23/2022, 11:43 AM

## 2022-04-23 NOTE — Plan of Care (Signed)

## 2022-04-26 ENCOUNTER — Telehealth: Payer: Self-pay | Admitting: Orthopaedic Surgery

## 2022-04-26 NOTE — Telephone Encounter (Signed)
[facility] [phone])   Patient had surgery last week and [facility] would like orders faxed over. [phone]) [phone]

## 2022-04-27 ENCOUNTER — Ambulatory Visit: Payer: Medicare PPO | Admitting: Gastroenterology

## 2022-04-27 NOTE — Telephone Encounter (Signed)
Op note faxed.

## 2022-04-29 ENCOUNTER — Telehealth: Payer: Self-pay | Admitting: Orthopaedic Surgery

## 2022-04-29 NOTE — Telephone Encounter (Signed)
Patient called needing a post op appointment for 05/04/2022. Patient said she had surgery 04/20/2022. The number to contact patient is [phone]

## 2022-04-29 NOTE — Telephone Encounter (Signed)
Patient aware this message will wait for Gwinda Passe to answer on Monday when she returns; let her know it may not be the 20th may have to be several days later

## 2022-04-29 NOTE — Telephone Encounter (Signed)
Patient needs a 2 week follow up from 04/21/22 for   LEFT INTRAMEDULLARY (IM) NAIL TIBIAL    Please call Nurse Eldridge at [phone]  with date please advise

## 2022-04-30 NOTE — Telephone Encounter (Signed)
I called patient, scheduled for 05/04/2022.

## 2022-05-04 ENCOUNTER — Encounter: Payer: Self-pay | Admitting: Orthopaedic Surgery

## 2022-05-04 ENCOUNTER — Encounter (HOSPITAL_COMMUNITY): Payer: Self-pay | Admitting: Orthopaedic Surgery

## 2022-05-04 ENCOUNTER — Ambulatory Visit (INDEPENDENT_AMBULATORY_CARE_PROVIDER_SITE_OTHER): Payer: Medicare PPO

## 2022-05-04 ENCOUNTER — Ambulatory Visit (INDEPENDENT_AMBULATORY_CARE_PROVIDER_SITE_OTHER): Payer: Medicare PPO | Admitting: Orthopaedic Surgery

## 2022-05-04 VITALS — BP 130/80 | HR 105 | Ht 68.0 in | Wt 92.0 lb

## 2022-05-04 DIAGNOSIS — M79605 Pain in left leg: Secondary | ICD-10-CM

## 2022-05-04 DIAGNOSIS — S82252A Displaced comminuted fracture of shaft of left tibia, initial encounter for closed fracture: Secondary | ICD-10-CM

## 2022-05-04 DIAGNOSIS — S82832A Other fracture of upper and lower end of left fibula, initial encounter for closed fracture: Secondary | ICD-10-CM

## 2022-05-04 NOTE — Progress Notes (Unsigned)
Office Visit Note/history and physical exam   Patient: Carolyn [name]           Date of Birth: January 10, 1956           MRN: [id number] Visit Date: 05/04/2022              Requested by: No referring provider defined for this encounter. PCP: Pcp, No   Assessment & Plan: Visit Diagnoses:  1. Pain in left leg   2. Displaced comminuted fracture of shaft of left tibia, initial encounter for closed fracture   3. Closed fracture of proximal end of left fibula, initial encounter     Plan: Long discussion with patient and her husband the patient has rotation at the fracture site around the nail.  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  She still has dressings over her opposite ankle with ulcers on the opposite ankle as well.  We discussed again as I had preoperatively with the patient that ideally 2 interlocks were used proximal and distal for fracture stabilization.  Plan to be insertion of single anterior screw in the single hole present on the ride.  Again with patient's malnourishment and hypoalbuminemia, current skin ulceration mainly on the ankle I do not think fixation with a distal screw from the medial side should be done.  Plan to be short anesthetic and insertion of single screw with rotational correction.  Discussed with patient has been options of getting CT scan but since there is some rotation around the nail that would just demonstrate what current position she was in at the time.  Questions elicited and answered.  Plan to be overnight stay since she be a late afternoon case.  Patient and husband understand and agree to proceed.  Medial ulcer treated with Xeroform new dressing and Dr. Sharol Given after clinical patient's ulcers and agreed with outlined treatment plan as well.   Follow-Up Instructions: No follow-ups on file.   Orders:  Orders Placed This Encounter  Procedures   XR Tibia/Fibula Left    No orders of the defined types were placed in this encounter.     Procedures: No procedures performed   Clinical Data: No additional findings.   Subjective: Chief Complaint  Patient presents with   Left Leg - Routine Post Op    04/21/2022 left IM Nail Tibia    HPI 67 year old female returns postop after intramedullary nail.  She had proximal interlocks placed for tib-fib fracture with proximal tibia head and neck fracture closed and also closed oblique distal third tibial shaft fracture that occurred after fall.  She had a diagnosis of Bullous Pemphigoid which made on diagnosis at [facility] dermatology when patient had blisters on her hands and feet with skin peeling and extension tube blisters on her legs and arms.  She was treated with high-dose steroids which improved her symptoms have and have completed.  She remains with remaining small ulcers where she had skin blisters and 1 areas over the distal medial ankle and no distal interlock screws were placed at the time of surgery due to dressing changes swelling and ulcer in the location with interlock screws will be placed.  She had swelling and some erythema anteriorly at the time.  Surgery date was 04/21/2022.  Patient returns today and has in sitting position some internal rotation noted not previously present.  She was nailed on a triangle and rotation was next  to the opposite leg.  Additionally patient had history of osteomyelitis in the past and out of the tibia and fibula.  Vitamin D deficiency protein calorie malnourishment, sepsis, pancreatitis, V. tach, metabolic acidosis, chronic pain with Duragesic patch also oral Dilaudid 2 mg pills, malabsorption, chronic alcoholic pancreatitis currently not drinking.  Patient was being treated with a cam boot and nonweightbearing.  Patient is here with her husband.  Review of Systems all systems updated unchanged from surgery date.   Objective: Vital Signs: BP 130/80   Pulse (!) 105    Ht 5' 8"$  (1.727 m)   Wt 92 lb (41.7 kg)   LMP 03/10/2009   BMI 13.99 kg/m   Physical Exam Constitutional:      Appearance: She is well-developed.     Comments: Thin malnourished appearance  HENT:     Head: Normocephalic.     Right Ear: External ear normal.     Left Ear: External ear normal. There is no impacted cerumen.  Eyes:     Pupils: Pupils are equal, round, and reactive to light.  Neck:     Thyroid: No thyromegaly.     Trachea: No tracheal deviation.  Cardiovascular:     Rate and Rhythm: Normal rate.  Pulmonary:     Effort: Pulmonary effort is normal.  Abdominal:     Palpations: Abdomen is soft.  Musculoskeletal:     Cervical back: No rigidity.  Skin:    General: Skin is warm and dry.  Neurological:     Mental Status: She is alert and oriented to person, place, and time.  Psychiatric:        Behavior: Behavior normal.     Ortho Exam tibial nail increased but proximal interlock areas closed with staples look good no cellulitis.  Patient remains with distal medial ulceration from blistering medial ankle.  Anterior skin over the ankle has now improved no erythema.  Distal pulses are intact.  [place] of the dorsum of the foot is intact.  In the sitting position patient has 35 degrees internal rotation of the ankle with 10 to 15 degrees in the malleolar axis with fibula anterior to the medial malleolus.  Opposite leg in sitting position is in neutral position.  With knee extension and slight external rotation pressure patella aligns with ankle.  There appears to be motion at the fracture site with slight rotation around the nail.  Specialty Comments:  No specialty comments available.  Imaging: XR Tibia/Fibula Left  Result Date: 05/05/2022 AP lateral tib-fib x-rays demonstrate proximal fibular fracture involving head and neck with tibial nail proximal interlocks.  Distal third tibial oblique shaft fracture noted.  Slight mismatch and shaft diameter at the fracture site  suggest rotation. Impression: Left tib-fib fracture post tibial nailing.    PMFS History: Patient Active Problem List   Diagnosis Date Noted   Displaced comminuted fracture of shaft of left tibia, initial encounter for closed fracture 04/21/2022   Closed fracture of proximal end of left fibula, initial encounter 04/21/2022   Leukocytosis 04/21/2022   Influenza A 04/06/2022   History of gastric ulcer 03/23/2022   Abnormal loss of weight 03/23/2022   Aspiration pneumonia of both lungs ([place]) 03/23/2022   Acute on chronic diastolic CHF (congestive heart failure) ([place]) 03/23/2022   NSVT (nonsustained ventricular tachycardia) ([place]) 03/20/2022   Localized swelling of right upper extremity 03/19/2022   Cellulitis of left lower extremity 03/17/2022   Hypophosphatemia 03/17/2022   Anemia of chronic disease 03/17/2022   Sepsis ([place])  03/14/2022   Severe sepsis ([place]) 03/13/2022   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   Abnormal CT of the abdomen 01/19/2022   Chronic gastric ulcer with perforation ([place]) [id number]   Alcoholic cirrhosis of liver without ascites ([place]) 01/18/2022   Occult blood in stools 01/18/2022   Portal hypertensive gastropathy ([place]) 01/18/2022   Anasarca 01/12/2022   Acute respiratory failure with hypoxia ([place]) 01/11/2022   Hypoalbuminemia 01/11/2022   Pleural effusion 01/10/2022   Abdominal distention 01/10/2022   Physical deconditioning 01/09/2022   Pressure injury of skin 01/08/2022   Hypokalemia 01/07/2022   Hypomagnesemia 01/07/2022   AKI (acute kidney injury) ([place]) 01/07/2022   Severe protein-calorie malnutrition ([place]) 01/07/2022   Cellulitis of right foot 01/07/2022   Normocytic anemia 01/07/2022   Osteomyelitis ([place]) 01/06/2022   Intractable nausea and vomiting [id number]   Metabolic acidosis, increased anion gap 06/12/2020   Seizure ([place]) 05/08/2017   Chronic pain syndrome 05/08/2017   Gastroesophageal reflux disease without esophagitis 05/08/2017   Chronic  alcoholic pancreatitis ([place]) 05/08/2017   Pancreatic steatorrhea 05/08/2017   Osteoporosis 03/16/2011   Endometriosis    Malabsorption    Chronic pancreatitis ([place])    Vitamin D deficiency    Past Medical History:  Diagnosis Date   Alcoholic cirrhosis ([place])    Chronic pancreatitis ([place])    Diabetes mellitus without complication ([place])    Endometriosis    GERD (gastroesophageal reflux disease)    Intractable nausea and vomiting 06/13/2020   Liver disease    Malabsorption    MALABSORPTION SYNDROME   Osteoporosis 03/2011   t score -2.5   Pancreatitis    due to cyst and tumors due to calcifications   Seizure ([facility])    no seizure disorder, controlled w/ meds per patient   Vitamin D deficiency 2012   VIT D 15    Family History  Problem Relation Age of Onset   Cancer Father        BONE   Breast cancer Maternal Grandmother     Past Surgical History:  Procedure Laterality Date   APPENDECTOMY     BIOPSY  01/19/2022   Procedure: BIOPSY;  Surgeon: Lavena Bullion, DO;  Location: [facility];  Service: Gastroenterology;;   CHOLECYSTECTOMY     ESOPHAGOGASTRODUODENOSCOPY (EGD) WITH PROPOFOL N/A 01/19/2022   Procedure: ESOPHAGOGASTRODUODENOSCOPY (EGD) WITH PROPOFOL;  Surgeon: Lavena Bullion, DO;  Location: [facility];  Service: Gastroenterology;  Laterality: N/A;   FEEDING TUBE RELOCATION  2010   JEJUNOSTOMY FEEDING TUBE  1990   MULTIPLE GASTRIC SURGERIES     MYOMECTOMY     OPEN REDUCTION INTERNAL FIXATION (ORIF) DISTAL RADIAL FRACTURE Left 06/29/2018   Procedure: OPEN REDUCTION INTERNAL FIXATION (ORIF)LEFT  DISTAL RADIAL FRACTURE;  Surgeon: Leanora Cover, MD;  Location: [place];  Service: Orthopedics;  Laterality: Left;   ROUX-EN-Y GASTRIC BYPASS     TIBIA IM NAIL INSERTION Left 04/21/2022   Procedure: LEFT INTRAMEDULLARY (IM) NAIL TIBIAL;  Surgeon: Marybelle Killings, MD;  Location: [facility];  Service: Orthopedics;  Laterality: Left;   TUBAL LIGATION     Social  History   Occupational History   Not on file  Tobacco Use   Smoking status: Never   Smokeless tobacco: Never  Vaping Use   Vaping Use: Never used  Substance and Sexual Activity   Alcohol use: Yes    Comment: occ ( none in several weeks)   Drug use: No   Sexual activity: Not Currently    Partners: Male  Birth control/protection: Post-menopausal

## 2022-05-04 NOTE — Progress Notes (Addendum)
Surgical Instructions for Carolyn [name]    Your procedure is scheduled on Wednesday, 05/05/22 at 3:03 PM - .4:50 PM.  Report to [facility] Main Entrance "A" at 12:30 P.M., then check in with the Admitting office.  Call this number if you have problems the morning of surgery:  [phone]   If you have any questions prior to your surgery date call [phone]: Open Monday-Friday 8am-4pm If you experience any cold or flu symptoms such as cough, fever, chills, shortness of breath, etc. between now and your scheduled surgery, please notify us at the above number     Remember:  Do not eat after midnight the night before your surgery-Tuesday.  You may drink clear liquids until 12 noon the morning of your surgery - Wednesday.   Clear liquids allowed are: Water, Non-Citrus Juices (without pulp), Carbonated Beverages, Clear Tea, Black Coffee ONLY (NO MILK, CREAM OR POWDERED CREAMER of any kind), and Gatorade    Take these medicines the morning of surgery with A SIP OF WATER:  Decadron, protronix, fentanyl patch if needed, diflucan, gabapentin, vicodin if needed, metoprolol, levothyroxine, cytomel, robaxin if needed, zofran if needed,and Novolog Insulin - none on day of surgery unless her CBG is greater than 220 mg/dL, then half the dose.    As of today, STOP taking any Aspirin (unless otherwise instructed by your surgeon) Aleve, Naproxen, Ibuprofen, Motrin, Advil, Goody's, BC's, all herbal medications, fish oil, and all vitamins.           Do not wear jewelry or makeup. Do not wear lotions, powders, perfumes or deodorant. Do not shave 48 hours prior to surgery.   Do not bring valuables to the hospital. Do not wear nail polish, gel polish, artificial nails, or any other type of covering on natural nails (fingers and toes) If you have artificial nails or gel coating that need to be removed by a nail salon, please have this removed prior to surgery. Artificial nails or gel coating may interfere  with anesthesia's ability to adequately monitor your vital signs.  [name] is not responsible for any belongings or valuables.     Contacts, glasses, hearing aids, dentures or partials may not be worn into surgery, please bring cases for these belongings   For patients admitted to the hospital, discharge time will be determined by your treatment team.   [place] VISITATION Patients having surgery or a procedure may have no more than 2 support people in the waiting area - these visitors may rotate.   Children under the age of 71 must have an adult with them who is not the patient. If the patient needs to stay at the hospital during part of their recovery, the visitor guidelines for inpatient rooms apply. Pre-op nurse will coordinate an appropriate time for 1 support person to accompany patient in pre-op.  This support person may not rotate.   Please refer to [email or web address] for the visitor guidelines for Inpatients (after your surgery is over and you are in a regular room).    Special instructions:    Oral Hygiene is also important to reduce your risk of infection.  Remember - BRUSH YOUR TEETH THE MORNING OF SURGERY WITH YOUR REGULAR TOOTHPASTE

## 2022-05-04 NOTE — Progress Notes (Signed)
Patient is at [facility] at [facility] for rehab after 04/21/22   for LEFT INTRAMEDULLARY (IM) NAIL TIBIAL  surgery.   Instructions for 05/05/22 surgery was fax to Nurse Fancisco at [phone], phone [phone].  PCP - none Cardiologist - n/a  Chest x-ray - 04/11/22 EKG - 04/21/22 Stress Test - n/a ECHO - 01/15/22 Cardiac Cath - n/a  ICD Pacemaker/Loop - n/a  Sleep Study -  n/a CPAP - none  Diabetes - Yes, Type 2 No Novolog Insulin on DOS unless CBG is greater than 220 mg/dL.  If greater than 276m/dL, then half your dose.  ERAS: Clear liquids til 12 Noon DOS.  Anesthesia review: Yes, Dr HSmith Robert STOP now taking any Aspirin (unless otherwise instructed by your surgeon), Aleve, Naproxen, Ibuprofen, Motrin, Advil, Goody's, BC's, all herbal medications, fish oil, and all vitamins.

## 2022-05-05 ENCOUNTER — Ambulatory Visit (HOSPITAL_COMMUNITY): Payer: Medicare PPO

## 2022-05-05 ENCOUNTER — Ambulatory Visit (HOSPITAL_BASED_OUTPATIENT_CLINIC_OR_DEPARTMENT_OTHER): Payer: Medicare PPO | Admitting: Anesthesiology

## 2022-05-05 ENCOUNTER — Encounter (HOSPITAL_COMMUNITY)
Admission: RE | Disposition: A | Discharge status: Home / Self Care | Payer: Self-pay | Source: Home / Self Care | Attending: Orthopaedic Surgery

## 2022-05-05 ENCOUNTER — Ambulatory Visit (HOSPITAL_COMMUNITY): Payer: Medicare PPO | Admitting: Anesthesiology

## 2022-05-05 ENCOUNTER — Other Ambulatory Visit: Payer: Self-pay

## 2022-05-05 ENCOUNTER — Observation Stay (HOSPITAL_COMMUNITY)
Admission: RE | Admit: 2022-05-05 | Discharge: 2022-05-06 | Disposition: A | Discharge status: Home / Self Care | Payer: Medicare PPO | Attending: Orthopaedic Surgery | Admitting: Orthopaedic Surgery

## 2022-05-05 ENCOUNTER — Encounter (HOSPITAL_COMMUNITY): Payer: Self-pay | Admitting: Orthopaedic Surgery

## 2022-05-05 DIAGNOSIS — S82202P Unspecified fracture of shaft of left tibia, subsequent encounter for closed fracture with malunion: Secondary | ICD-10-CM | POA: Diagnosis not present

## 2022-05-05 DIAGNOSIS — R2689 Other abnormalities of gait and mobility: Secondary | ICD-10-CM | POA: Insufficient documentation

## 2022-05-05 DIAGNOSIS — T84197A Other mechanical complication of internal fixation device of bone of left lower leg, initial encounter: Secondary | ICD-10-CM

## 2022-05-05 DIAGNOSIS — S82452A Displaced comminuted fracture of shaft of left fibula, initial encounter for closed fracture: Secondary | ICD-10-CM

## 2022-05-05 DIAGNOSIS — S82402P Unspecified fracture of shaft of left fibula, subsequent encounter for closed fracture with malunion: Secondary | ICD-10-CM

## 2022-05-05 DIAGNOSIS — S82252A Displaced comminuted fracture of shaft of left tibia, initial encounter for closed fracture: Secondary | ICD-10-CM | POA: Diagnosis present

## 2022-05-05 DIAGNOSIS — M810 Age-related osteoporosis without current pathological fracture: Secondary | ICD-10-CM | POA: Diagnosis present

## 2022-05-05 DIAGNOSIS — S82402A Unspecified fracture of shaft of left fibula, initial encounter for closed fracture: Secondary | ICD-10-CM | POA: Diagnosis not present

## 2022-05-05 DIAGNOSIS — S82202A Unspecified fracture of shaft of left tibia, initial encounter for closed fracture: Secondary | ICD-10-CM | POA: Diagnosis present

## 2022-05-05 DIAGNOSIS — E119 Type 2 diabetes mellitus without complications: Secondary | ICD-10-CM | POA: Insufficient documentation

## 2022-05-05 DIAGNOSIS — M6281 Muscle weakness (generalized): Secondary | ICD-10-CM | POA: Insufficient documentation

## 2022-05-05 DIAGNOSIS — I509 Heart failure, unspecified: Secondary | ICD-10-CM

## 2022-05-05 DIAGNOSIS — S82832A Other fracture of upper and lower end of left fibula, initial encounter for closed fracture: Secondary | ICD-10-CM | POA: Insufficient documentation

## 2022-05-05 DIAGNOSIS — R2681 Unsteadiness on feet: Secondary | ICD-10-CM | POA: Insufficient documentation

## 2022-05-05 DIAGNOSIS — K7460 Unspecified cirrhosis of liver: Secondary | ICD-10-CM

## 2022-05-05 DIAGNOSIS — X58XXXA Exposure to other specified factors, initial encounter: Secondary | ICD-10-CM | POA: Insufficient documentation

## 2022-05-05 HISTORY — DX: Type 2 diabetes mellitus without complications: E11.9

## 2022-05-05 HISTORY — PX: TIBIA IM NAIL INSERTION: SHX2516

## 2022-05-05 LAB — GLUCOSE, CAPILLARY
Glucose-Capillary: 116 mg/dL — ABNORMAL HIGH (ref 70–99)
Glucose-Capillary: 399 mg/dL — ABNORMAL HIGH (ref 70–99)

## 2022-05-05 SURGERY — INSERTION, INTRAMEDULLARY ROD, TIBIA
Anesthesia: General | Laterality: Left

## 2022-05-05 MED ORDER — INSULIN ASPART 100 UNIT/ML IJ SOLN
3.0000 [IU] | Freq: Three times a day (TID) | INTRAMUSCULAR | Status: DC
  Administered 2022-05-06 (×2): 3 [IU] via SUBCUTANEOUS

## 2022-05-05 MED ORDER — VITAMIN B-12 100 MCG PO TABS
100.0000 ug | ORAL_TABLET | Freq: Every day | ORAL | Status: DC
  Administered 2022-05-06: 100 ug via ORAL
  Filled 2022-05-05 (×2): qty 1

## 2022-05-05 MED ORDER — FENTANYL 50 MCG/HR TD PT72
1.0000 | MEDICATED_PATCH | TRANSDERMAL | Status: DC
  Administered 2022-05-06: 1 via TRANSDERMAL
  Filled 2022-05-05: qty 1

## 2022-05-05 MED ORDER — ACETAMINOPHEN 10 MG/ML IV SOLN: 1000.0000 mg | Freq: Once | INTRAVENOUS | Status: DC | PRN

## 2022-05-05 MED ORDER — ADULT MULTIVITAMIN W/MINERALS CH
1.0000 | ORAL_TABLET | Freq: Every day | ORAL | Status: DC
  Administered 2022-05-05 – 2022-05-06 (×2): 1 via ORAL
  Filled 2022-05-05 (×3): qty 1

## 2022-05-05 MED ORDER — ZINC SULFATE 220 (50 ZN) MG PO CAPS
220.0000 mg | ORAL_CAPSULE | Freq: Every day | ORAL | Status: DC
  Administered 2022-05-05 – 2022-05-06 (×2): 220 mg via ORAL
  Filled 2022-05-05 (×2): qty 1

## 2022-05-05 MED ORDER — BUPIVACAINE-EPINEPHRINE 0.5% -1:200000 IJ SOLN
INTRAMUSCULAR | Status: DC | PRN
  Administered 2022-05-05: 1.5 mL

## 2022-05-05 MED ORDER — LEVOTHYROXINE SODIUM 50 MCG PO TABS
50.0000 ug | ORAL_TABLET | Freq: Every day | ORAL | Status: DC
  Administered 2022-05-06: 50 ug via ORAL
  Filled 2022-05-05: qty 1

## 2022-05-05 MED ORDER — CHLORHEXIDINE GLUCONATE 0.12 % MT SOLN: 15.0000 mL | Freq: Once | OROMUCOSAL | Status: AC

## 2022-05-05 MED ORDER — FLUCONAZOLE 200 MG PO TABS
200.0000 mg | ORAL_TABLET | Freq: Every day | ORAL | Status: DC
  Administered 2022-05-05 – 2022-05-06 (×2): 200 mg via ORAL
  Filled 2022-05-05 (×2): qty 1

## 2022-05-05 MED ORDER — VITAMIN B-6 100 MG PO TABS
100.0000 mg | ORAL_TABLET | Freq: Every day | ORAL | Status: DC
  Administered 2022-05-05 – 2022-05-06 (×2): 100 mg via ORAL
  Filled 2022-05-05 (×2): qty 1

## 2022-05-05 MED ORDER — PROPOFOL 1000 MG/100ML IV EMUL
INTRAVENOUS | Status: AC
  Filled 2022-05-05: qty 100

## 2022-05-05 MED ORDER — DOCUSATE SODIUM 100 MG PO CAPS
100.0000 mg | ORAL_CAPSULE | Freq: Two times a day (BID) | ORAL | Status: DC
  Filled 2022-05-05: qty 1

## 2022-05-05 MED ORDER — HYDROMORPHONE HCL 2 MG PO TABS: 2.0000 mg | ORAL_TABLET | ORAL | Status: DC | PRN

## 2022-05-05 MED ORDER — CEFAZOLIN SODIUM-DEXTROSE 2-4 GM/100ML-% IV SOLN
2.0000 g | INTRAVENOUS | Status: AC
  Administered 2022-05-05: 2 g via INTRAVENOUS
  Filled 2022-05-05: qty 100

## 2022-05-05 MED ORDER — ACETAMINOPHEN 325 MG PO TABS: 325.0000 mg | ORAL_TABLET | Freq: Four times a day (QID) | ORAL | Status: DC | PRN

## 2022-05-05 MED ORDER — MORPHINE SULFATE (PF) 2 MG/ML IV SOLN
0.5000 mg | INTRAVENOUS | Status: DC | PRN
  Administered 2022-05-05 – 2022-05-06 (×3): 1 mg via INTRAVENOUS
  Filled 2022-05-05 (×3): qty 1

## 2022-05-05 MED ORDER — EPINEPHRINE PF 1 MG/ML IJ SOLN
INTRAMUSCULAR | Status: AC
  Filled 2022-05-05: qty 1

## 2022-05-05 MED ORDER — LIDOCAINE 2% (20 MG/ML) 5 ML SYRINGE
INTRAMUSCULAR | Status: DC | PRN
  Administered 2022-05-05: 50 mg via INTRAVENOUS

## 2022-05-05 MED ORDER — LIOTHYRONINE SODIUM 25 MCG PO TABS
25.0000 ug | ORAL_TABLET | Freq: Every day | ORAL | Status: DC
  Administered 2022-05-06: 25 ug via ORAL
  Filled 2022-05-05: qty 1

## 2022-05-05 MED ORDER — GABAPENTIN 300 MG PO CAPS
300.0000 mg | ORAL_CAPSULE | Freq: Two times a day (BID) | ORAL | Status: DC
  Administered 2022-05-05 – 2022-05-06 (×2): 300 mg via ORAL
  Filled 2022-05-05 (×2): qty 1

## 2022-05-05 MED ORDER — ORAL CARE MOUTH RINSE: 15.0000 mL | Freq: Once | OROMUCOSAL | Status: AC

## 2022-05-05 MED ORDER — CHLORHEXIDINE GLUCONATE 0.12 % MT SOLN
OROMUCOSAL | Status: AC
  Administered 2022-05-05: 15 mL via OROMUCOSAL
  Filled 2022-05-05: qty 15

## 2022-05-05 MED ORDER — SILVER SULFADIAZINE 1 % EX CREA
TOPICAL_CREAM | Freq: Every day | CUTANEOUS | Status: DC
  Filled 2022-05-05: qty 85

## 2022-05-05 MED ORDER — DEXMEDETOMIDINE HCL IN NACL 80 MCG/20ML IV SOLN
INTRAVENOUS | Status: AC
  Filled 2022-05-05: qty 20

## 2022-05-05 MED ORDER — FUROSEMIDE 20 MG PO TABS
20.0000 mg | ORAL_TABLET | Freq: Every day | ORAL | Status: DC
  Administered 2022-05-05 – 2022-05-06 (×2): 20 mg via ORAL
  Filled 2022-05-05 (×2): qty 1

## 2022-05-05 MED ORDER — DEXMEDETOMIDINE HCL IN NACL 80 MCG/20ML IV SOLN
INTRAVENOUS | Status: DC | PRN
  Administered 2022-05-05 (×2): 8 ug via BUCCAL

## 2022-05-05 MED ORDER — ONDANSETRON HCL 4 MG/2ML IJ SOLN: 4.0000 mg | Freq: Four times a day (QID) | INTRAMUSCULAR | Status: DC | PRN

## 2022-05-05 MED ORDER — PROSOURCE PLUS PO LIQD
30.0000 mL | Freq: Two times a day (BID) | ORAL | Status: DC
  Administered 2022-05-06: 30 mL via ORAL
  Filled 2022-05-05: qty 30

## 2022-05-05 MED ORDER — ONDANSETRON HCL 4 MG/2ML IJ SOLN
INTRAMUSCULAR | Status: AC
  Filled 2022-05-05: qty 2

## 2022-05-05 MED ORDER — DEXAMETHASONE SODIUM PHOSPHATE 10 MG/ML IJ SOLN
INTRAMUSCULAR | Status: AC
  Filled 2022-05-05: qty 1

## 2022-05-05 MED ORDER — VITAMIN D 25 MCG (1000 UNIT) PO TABS
1000.0000 [IU] | ORAL_TABLET | Freq: Every day | ORAL | Status: DC
  Administered 2022-05-05 – 2022-05-06 (×2): 1000 [IU] via ORAL
  Filled 2022-05-05 (×2): qty 1

## 2022-05-05 MED ORDER — MIDAZOLAM HCL 2 MG/2ML IJ SOLN
INTRAMUSCULAR | Status: AC
  Filled 2022-05-05: qty 2

## 2022-05-05 MED ORDER — HYDROCODONE-ACETAMINOPHEN 5-325 MG PO TABS: 1.0000 | ORAL_TABLET | ORAL | Status: DC | PRN

## 2022-05-05 MED ORDER — INSULIN ASPART 100 UNIT/ML IJ SOLN
0.0000 [IU] | Freq: Every day | INTRAMUSCULAR | Status: DC
  Administered 2022-05-05: 5 [IU] via SUBCUTANEOUS

## 2022-05-05 MED ORDER — ALBUMIN HUMAN 5 % IV SOLN: INTRAVENOUS | Status: DC | PRN

## 2022-05-05 MED ORDER — METHOCARBAMOL 500 MG PO TABS
500.0000 mg | ORAL_TABLET | Freq: Four times a day (QID) | ORAL | Status: DC | PRN
  Administered 2022-05-05 – 2022-05-06 (×2): 500 mg via ORAL
  Filled 2022-05-05 (×2): qty 1

## 2022-05-05 MED ORDER — PROPOFOL 500 MG/50ML IV EMUL
INTRAVENOUS | Status: DC | PRN
  Administered 2022-05-05: 150 ug/kg/min via INTRAVENOUS

## 2022-05-05 MED ORDER — FENTANYL CITRATE (PF) 250 MCG/5ML IJ SOLN
INTRAMUSCULAR | Status: AC
  Filled 2022-05-05: qty 5

## 2022-05-05 MED ORDER — LOPERAMIDE HCL 2 MG PO CAPS: 2.0000 mg | ORAL_CAPSULE | Freq: Four times a day (QID) | ORAL | Status: DC | PRN

## 2022-05-05 MED ORDER — SACCHAROMYCES BOULARDII 250 MG PO CAPS
250.0000 mg | ORAL_CAPSULE | Freq: Two times a day (BID) | ORAL | Status: DC
  Administered 2022-05-05 – 2022-05-06 (×2): 250 mg via ORAL
  Filled 2022-05-05 (×2): qty 1

## 2022-05-05 MED ORDER — ONDANSETRON HCL 4 MG/2ML IJ SOLN: 4.0000 mg | Freq: Once | INTRAMUSCULAR | Status: DC | PRN

## 2022-05-05 MED ORDER — DEXAMETHASONE SODIUM PHOSPHATE 10 MG/ML IJ SOLN
INTRAMUSCULAR | Status: DC | PRN
  Administered 2022-05-05: 8 mg via INTRAVENOUS

## 2022-05-05 MED ORDER — ACETAMINOPHEN 10 MG/ML IV SOLN
INTRAVENOUS | Status: DC | PRN
  Administered 2022-05-05: 1000 mg via INTRAVENOUS

## 2022-05-05 MED ORDER — FENTANYL CITRATE (PF) 100 MCG/2ML IJ SOLN
INTRAMUSCULAR | Status: AC
  Filled 2022-05-05: qty 2

## 2022-05-05 MED ORDER — DOXYCYCLINE HYCLATE 100 MG PO TBEC: 100.0000 mg | DELAYED_RELEASE_TABLET | Freq: Two times a day (BID) | ORAL | Status: DC

## 2022-05-05 MED ORDER — DEXAMETHASONE 2 MG PO TABS: 2.0000 mg | ORAL_TABLET | Freq: Every day | ORAL | Status: DC

## 2022-05-05 MED ORDER — METOCLOPRAMIDE HCL 5 MG PO TABS: 5.0000 mg | ORAL_TABLET | Freq: Three times a day (TID) | ORAL | Status: DC | PRN

## 2022-05-05 MED ORDER — HYDROCODONE-ACETAMINOPHEN 7.5-325 MG PO TABS
1.0000 | ORAL_TABLET | ORAL | Status: DC | PRN
  Administered 2022-05-05: 2 via ORAL
  Administered 2022-05-06: 1 via ORAL
  Filled 2022-05-05: qty 2
  Filled 2022-05-05: qty 1

## 2022-05-05 MED ORDER — DEXAMETHASONE 2 MG PO TABS
2.0000 mg | ORAL_TABLET | Freq: Every day | ORAL | Status: AC
  Administered 2022-05-05: 2 mg via ORAL
  Filled 2022-05-05: qty 1

## 2022-05-05 MED ORDER — POLYETHYLENE GLYCOL 3350 17 G PO PACK: 17.0000 g | PACK | Freq: Two times a day (BID) | ORAL | Status: DC | PRN

## 2022-05-05 MED ORDER — FAMOTIDINE 20 MG PO TABS
10.0000 mg | ORAL_TABLET | Freq: Two times a day (BID) | ORAL | Status: DC
  Administered 2022-05-05 – 2022-05-06 (×2): 10 mg via ORAL
  Filled 2022-05-05 (×2): qty 1

## 2022-05-05 MED ORDER — HYDROCODONE-ACETAMINOPHEN 5-325 MG PO TABS
1.0000 | ORAL_TABLET | ORAL | Status: DC | PRN
  Administered 2022-05-05: 2 via ORAL
  Filled 2022-05-05 (×2): qty 2

## 2022-05-05 MED ORDER — METOPROLOL TARTRATE 12.5 MG HALF TABLET
12.5000 mg | ORAL_TABLET | Freq: Two times a day (BID) | ORAL | Status: DC
  Administered 2022-05-05 – 2022-05-06 (×2): 12.5 mg via ORAL
  Filled 2022-05-05 (×2): qty 1

## 2022-05-05 MED ORDER — ONDANSETRON HCL 4 MG PO TABS: 4.0000 mg | ORAL_TABLET | Freq: Three times a day (TID) | ORAL | Status: DC | PRN

## 2022-05-05 MED ORDER — LACTATED RINGERS IV SOLN: INTRAVENOUS | Status: DC

## 2022-05-05 MED ORDER — ACETAMINOPHEN 500 MG PO TABS
500.0000 mg | ORAL_TABLET | Freq: Four times a day (QID) | ORAL | Status: DC
  Administered 2022-05-06 (×2): 500 mg via ORAL
  Filled 2022-05-05 (×3): qty 1

## 2022-05-05 MED ORDER — METOCLOPRAMIDE HCL 5 MG/ML IJ SOLN: 5.0000 mg | Freq: Three times a day (TID) | INTRAMUSCULAR | Status: DC | PRN

## 2022-05-05 MED ORDER — PHENOBARBITAL 16.2 MG PO TABS
16.5000 mg | ORAL_TABLET | Freq: Every day | ORAL | Status: DC
  Administered 2022-05-05: 16.2 mg via ORAL
  Filled 2022-05-05: qty 1

## 2022-05-05 MED ORDER — ONDANSETRON HCL 4 MG PO TABS: 4.0000 mg | ORAL_TABLET | Freq: Four times a day (QID) | ORAL | Status: DC | PRN

## 2022-05-05 MED ORDER — AMOXICILLIN-POT CLAVULANATE 875-125 MG PO TABS: 1.0000 | ORAL_TABLET | Freq: Two times a day (BID) | ORAL | Status: DC

## 2022-05-05 MED ORDER — ONDANSETRON HCL 4 MG/2ML IJ SOLN
INTRAMUSCULAR | Status: DC | PRN
  Administered 2022-05-05: 4 mg via INTRAVENOUS

## 2022-05-05 MED ORDER — ENSURE ENLIVE PO LIQD
237.0000 mL | Freq: Three times a day (TID) | ORAL | Status: DC
  Administered 2022-05-05 – 2022-05-06 (×2): 237 mL via ORAL

## 2022-05-05 MED ORDER — ACETAMINOPHEN 325 MG PO TABS: 650.0000 mg | ORAL_TABLET | ORAL | Status: DC | PRN

## 2022-05-05 MED ORDER — PROPOFOL 10 MG/ML IV BOLUS
INTRAVENOUS | Status: DC | PRN
  Administered 2022-05-05: 100 mg via INTRAVENOUS

## 2022-05-05 MED ORDER — MIDAZOLAM HCL 5 MG/5ML IJ SOLN
INTRAMUSCULAR | Status: DC | PRN
  Administered 2022-05-05: 2 mg via INTRAVENOUS

## 2022-05-05 MED ORDER — ACETAMINOPHEN 10 MG/ML IV SOLN
INTRAVENOUS | Status: AC
  Filled 2022-05-05: qty 100

## 2022-05-05 MED ORDER — DEXTROSE-NACL 5-0.45 % IV SOLN: INTRAVENOUS | Status: DC

## 2022-05-05 MED ORDER — PANCRELIPASE (LIP-PROT-AMYL) 12000-38000 UNITS PO CPEP
24000.0000 [IU] | ORAL_CAPSULE | Freq: Three times a day (TID) | ORAL | Status: DC
  Administered 2022-05-06 (×2): 24000 [IU] via ORAL
  Filled 2022-05-05 (×2): qty 2

## 2022-05-05 MED ORDER — BUPIVACAINE HCL (PF) 0.5 % IJ SOLN
INTRAMUSCULAR | Status: AC
  Filled 2022-05-05: qty 30

## 2022-05-05 MED ORDER — DEXAMETHASONE 2 MG PO TABS: 2.0000 mg | ORAL_TABLET | ORAL | Status: DC

## 2022-05-05 MED ORDER — FENTANYL CITRATE (PF) 250 MCG/5ML IJ SOLN
INTRAMUSCULAR | Status: DC | PRN
  Administered 2022-05-05 (×3): 25 ug via INTRAVENOUS
  Administered 2022-05-05: 50 ug via INTRAVENOUS
  Administered 2022-05-05: 25 ug via INTRAVENOUS

## 2022-05-05 MED ORDER — SELENIUM 200 MCG PO TABS
100.0000 ug | ORAL_TABLET | Freq: Every day | ORAL | Status: DC
  Filled 2022-05-05 (×3): qty 1

## 2022-05-05 MED ORDER — FENTANYL CITRATE (PF) 100 MCG/2ML IJ SOLN
25.0000 ug | INTRAMUSCULAR | Status: DC | PRN
  Administered 2022-05-05: 50 ug via INTRAVENOUS

## 2022-05-05 MED ORDER — MELATONIN 5 MG PO TABS
5.0000 mg | ORAL_TABLET | Freq: Every day | ORAL | Status: DC
  Administered 2022-05-05: 5 mg via ORAL
  Filled 2022-05-05: qty 1

## 2022-05-05 MED ORDER — PANTOPRAZOLE SODIUM 40 MG PO TBEC
40.0000 mg | DELAYED_RELEASE_TABLET | Freq: Two times a day (BID) | ORAL | Status: DC
  Administered 2022-05-06: 40 mg via ORAL
  Filled 2022-05-05: qty 1

## 2022-05-05 MED ORDER — INSULIN ASPART 100 UNIT/ML IJ SOLN: 0.0000 [IU] | Freq: Three times a day (TID) | INTRAMUSCULAR | Status: DC

## 2022-05-05 MED ORDER — VITAMIN E 45 MG (100 UNIT) PO CAPS
400.0000 [IU] | ORAL_CAPSULE | Freq: Every day | ORAL | Status: DC
  Administered 2022-05-05 – 2022-05-06 (×2): 400 [IU] via ORAL
  Filled 2022-05-05 (×2): qty 4

## 2022-05-05 MED ORDER — VITAMIN A 3 MG (10000 UNIT) PO CAPS
10000.0000 [IU] | ORAL_CAPSULE | ORAL | Status: DC
  Administered 2022-05-06: 10000 [IU] via ORAL
  Filled 2022-05-05: qty 1

## 2022-05-05 MED ORDER — PROPOFOL 10 MG/ML IV BOLUS
INTRAVENOUS | Status: AC
  Filled 2022-05-05: qty 20

## 2022-05-05 SURGICAL SUPPLY — 62 items
BAG COUNTER SPONGE SURGICOUNT (BAG) ×2 IMPLANT
BAG SPNG CNTER NS LX DISP (BAG) ×1
BANDAGE ESMARK 6X9 LF (GAUZE/BANDAGES/DRESSINGS) IMPLANT
BIT DRILL CALIBRTD FREE HND4.3 (BIT) IMPLANT
BLADE CLIPPER SURG (BLADE) IMPLANT
BLADE SURG 15 STRL LF DISP TIS (BLADE) ×2 IMPLANT
BLADE SURG 15 STRL SS (BLADE) ×1
BNDG CMPR 5X6 CHSV STRCH STRL (GAUZE/BANDAGES/DRESSINGS) ×1
BNDG CMPR 9X6 STRL LF SNTH (GAUZE/BANDAGES/DRESSINGS)
BNDG COHESIVE 6X5 TAN ST LF (GAUZE/BANDAGES/DRESSINGS) ×2 IMPLANT
BNDG ELASTIC 4X5.8 VLCR STR LF (GAUZE/BANDAGES/DRESSINGS) ×2 IMPLANT
BNDG ELASTIC 6X5.8 VLCR STR LF (GAUZE/BANDAGES/DRESSINGS) ×2 IMPLANT
BNDG ESMARK 6X9 LF (GAUZE/BANDAGES/DRESSINGS)
BNDG GAUZE DERMACEA FLUFF 4 (GAUZE/BANDAGES/DRESSINGS) ×2 IMPLANT
BNDG GZE DERMACEA 4 6PLY (GAUZE/BANDAGES/DRESSINGS)
COVER SURGICAL LIGHT HANDLE (MISCELLANEOUS) ×4 IMPLANT
CUFF TOURN SGL QUICK 34 (TOURNIQUET CUFF) ×1
CUFF TOURN SGL QUICK 42 (TOURNIQUET CUFF) IMPLANT
CUFF TRNQT CYL 34X4.125X (TOURNIQUET CUFF) IMPLANT
DRAPE C-ARM 42X72 X-RAY (DRAPES) ×2 IMPLANT
DRAPE C-ARMOR (DRAPES) IMPLANT
DRAPE HALF SHEET 40X57 (DRAPES) ×4 IMPLANT
DRAPE IMP U-DRAPE 54X76 (DRAPES) ×2 IMPLANT
DRAPE ORTHO SPLIT 77X108 STRL (DRAPES) ×2
DRAPE SURG ORHT 6 SPLT 77X108 (DRAPES) ×4 IMPLANT
DRAPE U-SHAPE 47X51 STRL (DRAPES) ×2 IMPLANT
DRILL CALIBRATED FREE HAND 4.3 (BIT) ×1
DURAPREP 26ML APPLICATOR (WOUND CARE) ×2 IMPLANT
ELECT REM PT RETURN 9FT ADLT (ELECTROSURGICAL) ×1
ELECTRODE REM PT RTRN 9FT ADLT (ELECTROSURGICAL) ×2 IMPLANT
FACESHIELD WRAPAROUND (MASK) IMPLANT
FACESHIELD WRAPAROUND OR TEAM (MASK) IMPLANT
GAUZE PAD ABD 8X10 STRL (GAUZE/BANDAGES/DRESSINGS) IMPLANT
GAUZE SPONGE 4X4 12PLY STRL (GAUZE/BANDAGES/DRESSINGS) ×4 IMPLANT
GAUZE XEROFORM 5X9 LF (GAUZE/BANDAGES/DRESSINGS) ×2 IMPLANT
GLOVE BIOGEL PI IND STRL 8 (GLOVE) ×4 IMPLANT
GLOVE ORTHO TXT STRL SZ7.5 (GLOVE) ×2 IMPLANT
GOWN STRL REUS W/ TWL LRG LVL3 (GOWN DISPOSABLE) ×2 IMPLANT
GOWN STRL REUS W/ TWL XL LVL3 (GOWN DISPOSABLE) ×2 IMPLANT
GOWN STRL REUS W/TWL 2XL LVL3 (GOWN DISPOSABLE) ×2 IMPLANT
GOWN STRL REUS W/TWL LRG LVL3 (GOWN DISPOSABLE) ×1
GOWN STRL REUS W/TWL XL LVL3 (GOWN DISPOSABLE) ×1
KIT BASIN OR (CUSTOM PROCEDURE TRAY) ×2 IMPLANT
KIT TURNOVER KIT B (KITS) ×2 IMPLANT
MANIFOLD NEPTUNE II (INSTRUMENTS) ×2 IMPLANT
NS IRRIG 1000ML POUR BTL (IV SOLUTION) ×2 IMPLANT
PACK GENERAL/GYN (CUSTOM PROCEDURE TRAY) ×2 IMPLANT
PACK UNIVERSAL I (CUSTOM PROCEDURE TRAY) ×2 IMPLANT
PAD ARMBOARD 7.5X6 YLW CONV (MISCELLANEOUS) ×4 IMPLANT
PADDING CAST COTTON 6X4 STRL (CAST SUPPLIES) IMPLANT
SCREW HEX HEAD 3.5X32.5 (Screw) ×1 IMPLANT
SCREW Z NAIL 5.0X32.5 CORT (Screw) IMPLANT
STAPLER VISISTAT 35W (STAPLE) ×2 IMPLANT
STOCKINETTE IMPERVIOUS LG (DRAPES) ×2 IMPLANT
SUT VIC AB 0 CT1 27 (SUTURE)
SUT VIC AB 0 CT1 27XBRD ANBCTR (SUTURE) ×2 IMPLANT
SUT VIC AB 2-0 CT1 27 (SUTURE)
SUT VIC AB 2-0 CT1 TAPERPNT 27 (SUTURE) ×2 IMPLANT
TOWEL GREEN STERILE (TOWEL DISPOSABLE) ×2 IMPLANT
TOWEL GREEN STERILE FF (TOWEL DISPOSABLE) ×2 IMPLANT
TRAY FOLEY MTR SLVR 16FR STAT (SET/KITS/TRAYS/PACK) IMPLANT
WATER STERILE IRR 1000ML POUR (IV SOLUTION) ×2 IMPLANT

## 2022-05-05 NOTE — Op Note (Addendum)
Preop diagnosis: Previous left tibial nail with early malrotation.  Postop diagnosis same  Procedure: Closed de- rotation and left tibial nail distal interlock screw placement.  Surgeon: Lorin Mercy MD  Assistant: Izola Price, RNFA.  Tourniquet: Applied but not used anesthesia, General LMA.  Brief history: 67 year old female who underwent tibial nail 04/21/2022 with proximal interlock but no distal neurologic due to skin blisters and rash from resolving Bullous Pemphigoid.  Patient had exfoliating rash over balls of her feet hands multiple areas over her legs and had been referred for local dermatologist to [facility] dermatology where she was treated with high-dose steroids with some improvement.  She continued to have areas of small 1 to 2 cm full-thickness areas of skin loss with necrosis and this included distal medial ankle where normally the distal interlock screws would be placed.  Patient did not have distal interlock screws and this was discussed with patient husband.  Her rotation look good and she had some rotational stability but after couple weeks postop she had some bone resorption had a few millimeters of shortening and then internal rotation 30 to 40 degrees of the distal fragment.  There also was some slight impaction of the proximal fibula fracture which was at the fibular neck.  Patient had multiple problems including history of metabolic acidosis, chronic pancreatitis, V. tach, Duragesic patch, oral Dilaudid, malabsorption, chronic acute alcoholic pancreatitis not currently drinking,diabetes, renal problems.  Patient's BMI was 13.9 and she had malnourishment with low albumin despite supplements.  When she was discharged from the hospital she had good rotation and then when she returned from first postop visit she had internal rotation of the distal fragment of about 40 degrees which was noted yesterday at the office and she is brought in today for placement of the single anterior screw.   In the office she could be externally rotated and partially corrected with her knee in extension.  Previously skin anteriorly over the leg was erythematous edematous and did not look good.  Distal interlocks was purposely not placed due to poor quality of the skin with open ulcerations that had drained some and had repetitive wound changes.  Medial skin still looks the same from surgery date on 04/21/2022 however anterior skin is improved the point where it is acceptable for anterior interlock screw.  Procedure: After induction of general anesthesia proximal thigh tourniquet application leg was prepped with DuraPrep leaving mid thigh to the tip of toes open.  Opposite leg was exposed for visualization and rotation.  Under C arm is able to distract the leg and externally rotated to align it Opyd identical with the opposite leg.  There was 30 degrees of rotation of the distal fragment and with distraction the tip of the rod was about 1 cm above the old growth plate which is where it was on 04/21/2022 before she had some slight settling and malrotation.  Using RNFA assist with distraction and external rotation of the distal fragment Today pad around hole small incision was made anteriorly over the whole blunt dissection spreading adjacent to the extensor tendon down to the bone drilling bicortical under C-arm visualization and then placement of a single 3.5 x 32.5 mm interlock screw.  This gave good rotational stability toes were facing and slight external rotation with the patella pointing to the ceiling which is identical to the opposite leg.  Fracture a lengthy been gained a few millimeters.  Skin staples applied leg was stable flexion extension of both knees looking at rotation  with intermalleolar axis and with the knees flexed at 90 showed near symmetrical position the foot.  She did not appear overcorrected was in external rotation and might have been somewhere between 2 to 3 degrees off from the opposite leg.   Xeroform 4 x 4's ABD over the heel cotton Webril Ace wrap and then a cam boot was then applied postoperatively.  Patient tolerated procedure well transferred care in stable condition.

## 2022-05-05 NOTE — Interval H&P Note (Signed)
History and Physical Interval Note:  05/05/2022 2:14 PM  Carolyn [name]  has presented today for surgery, with the diagnosis of Left tibial malrotation.  The various methods of treatment have been discussed with the patient and family. After consideration of risks, benefits and other options for treatment, the patient has consented to  Procedure(s): LEFT TIBIAL NAIL DISTAL INTERLOCK SCREW PLACEMENT (Left) as a surgical intervention.  The patient's history has been reviewed, patient examined, no change in status, stable for surgery.  I have reviewed the patient's chart and labs.  Questions were answered to the patient's satisfaction.     Marybelle Killings

## 2022-05-05 NOTE — Anesthesia Procedure Notes (Signed)
Procedure Name: LMA Insertion Date/Time: 05/05/2022 3:27 PM  Performed by: Jenne Campus, CRNAPre-anesthesia Checklist: Patient identified, Emergency Drugs available, Suction available and Patient being monitored Patient Re-evaluated:Patient Re-evaluated prior to induction Oxygen Delivery Method: Circle System Utilized Preoxygenation: Pre-oxygenation with 100% oxygen Induction Type: IV induction Ventilation: Mask ventilation without difficulty LMA: LMA inserted LMA Size: 4.0 Number of attempts: 1 Airway Equipment and Method: Bite block Placement Confirmation: positive ETCO2 and breath sounds checked- equal and bilateral Tube secured with: Tape Dental Injury: Teeth and Oropharynx as per pre-operative assessment

## 2022-05-05 NOTE — Transfer of Care (Signed)
Immediate Anesthesia Transfer of Care Note  Patient: Carolyn [name]  Procedure(s) Performed: LEFT TIBIAL NAIL DISTAL SCREW PLACEMENT (Left)  Patient Location: PACU  Anesthesia Type:General  Level of Consciousness: awake, oriented, and patient cooperative  Airway & Oxygen Therapy: Patient Spontanous Breathing and Patient connected to nasal cannula oxygen  Post-op Assessment: Report given to RN and Post -op Vital signs reviewed and stable  Post vital signs: Reviewed  Last Vitals:  Vitals Value Taken Time  BP 123/70 05/05/22 1623  Temp    Pulse 81 05/05/22 1629  Resp 13 05/05/22 1629  SpO2 100 % 05/05/22 1629  Vitals shown include unvalidated device data.  Last Pain:  Vitals:   05/05/22 1311  TempSrc:   PainSc: 7       Patients Stated Pain Goal: 0 ([id number])  Complications: No notable events documented.

## 2022-05-05 NOTE — Anesthesia Preprocedure Evaluation (Addendum)
Anesthesia Evaluation  Patient identified by MRN, date of birth, ID band Patient awake    Reviewed: Allergy & Precautions, NPO status , Patient's Chart, lab work & pertinent test results  History of Anesthesia Complications Negative for: history of anesthetic complications  Airway Mallampati: II  TM Distance: >3 FB Neck ROM: Full    Dental  (+) Dental Advisory Given, Teeth Intact, Missing, Implants   Pulmonary    Pulmonary exam normal breath sounds clear to auscultation       Cardiovascular +CHF  Normal cardiovascular exam Rhythm:Regular Rate:Normal   '23 TTE - EF 70 to 75%. There is mild left ventricular hypertrophy of the basal-septal segment. Grade I diastolic dysfunction (impaired relaxation). Trivial mitral valve regurgitation. Aortic valve regurgitation is trivial.      Neuro/Psych Seizures -, Well Controlled,   negative psych ROS   GI/Hepatic ,GERD  Medicated and Controlled,,(+) Cirrhosis     substance abuse  alcohol use S/p gastric bypass Chronic pancreatitis    Endo/Other  diabetes   Ca 7.3   Renal/GU Renal diseaseLab Results      Component                Value               Date                      CREATININE               0.57                04/23/2022                BUN                      23                  04/23/2022                NA                       132 (L)             04/23/2022                K                        4.3                 04/23/2022                CL                       98                  04/23/2022                CO2                      22                  04/23/2022                Musculoskeletal negative musculoskeletal ROS (+)   Chronic pain    Abdominal   Peds  Hematology  (+) Blood dyscrasia, anemia Lab Results      Component  Value               Date                      WBC                      14.9 (H)            04/23/2022                 HGB                      8.4 (L)             04/23/2022                HCT                      24.6 (L)            04/23/2022                MCV                      95.1                04/23/2022                PLT                      154                 04/23/2022            Plt 123k    Anesthesia Other Findings   Reproductive/Obstetrics                              Anesthesia Physical Anesthesia Plan  ASA: 4  Anesthesia Plan: General   Post-op Pain Management: Ofirmev IV (intra-op)* and Precedex   Induction: Intravenous  PONV Risk Score and Plan: 2 and Propofol infusion and Treatment may vary due to age or medical condition  Airway Management Planned: LMA  Additional Equipment: None  Intra-op Plan:   Post-operative Plan: Extubation in OR  Informed Consent: I have reviewed the patients History and Physical, chart, labs and discussed the procedure including the risks, benefits and alternatives for the proposed anesthesia with the patient or authorized representative who has indicated his/her understanding and acceptance.   Patient has DNR.  Discussed DNR with patient and Suspend DNR.   Dental advisory given  Plan Discussed with: CRNA, Anesthesiologist and Surgeon  Anesthesia Plan Comments: (Surgeon requests no block and will localize in OR  LMA TIVA)       Anesthesia Quick Evaluation

## 2022-05-05 NOTE — H&P (Signed)
Office Visit Note/history and physical exam              Patient: Carolyn [name]                                 Date of Birth: 08-10-1955                                                    MRN: [id number] Visit Date: 05/04/2022                                                                     Requested by: No referring provider defined for this encounter. PCP: Pcp, No     Assessment & Plan: Visit Diagnoses:  1. Pain in left leg   2. Displaced comminuted fracture of shaft of left tibia, initial encounter for closed fracture   3. Closed fracture of proximal end of left fibula, initial encounter       Plan: Long discussion with patient and her husband the patient has rotation at the fracture site around the nail.  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  She still has dressings over her opposite ankle with ulcers on the opposite ankle as well.  We discussed again as I had preoperatively with the patient that ideally 2 interlocks were used proximal and distal for fracture stabilization.  Plan to be insertion of single anterior screw in the single hole present on the ride.  Again with patient's malnourishment and hypoalbuminemia, current skin ulceration mainly on the ankle I do not think fixation with a distal screw from the medial side should be done.  Plan to be short anesthetic and insertion of single screw with rotational correction.  Discussed with patient has been options of getting CT scan but since there is some rotation around the nail that would just demonstrate what current position she was in at the time.  Questions elicited and answered.  Plan to be overnight stay since she be a late afternoon case.  Patient and husband understand and agree to proceed.  Medial ulcer treated with Xeroform new dressing and Dr. Sharol Given after clinical patient's ulcers and agreed with outlined treatment plan as  well.     Follow-Up Instructions: No follow-ups on file.    Orders:     Orders Placed This Encounter  Procedures   XR Tibia/Fibula Left    No orders of the defined types were placed in this encounter.        Procedures: No procedures performed     Clinical Data: No additional findings.     Subjective:     Chief Complaint  Patient presents with   Left Leg - Routine Post Op      04/21/2022 left IM Nail Tibia      HPI 67 year old female returns postop after intramedullary nail.  She had proximal interlocks placed for tib-fib fracture with proximal tibia head and neck fracture  closed and also closed oblique distal third tibial shaft fracture that occurred after fall.  She had a diagnosis of Bullous Pemphigoid which made on diagnosis at [facility] dermatology when patient had blisters on her hands and feet with skin peeling and extension tube blisters on her legs and arms.  She was treated with high-dose steroids which improved her symptoms have and have completed.  She remains with remaining small ulcers where she had skin blisters and 1 areas over the distal medial ankle and no distal interlock screws were placed at the time of surgery due to dressing changes swelling and ulcer in the location with interlock screws will be placed.  She had swelling and some erythema anteriorly at the time.  Surgery date was 04/21/2022.  Patient returns today and has in sitting position some internal rotation noted not previously present.  She was nailed on a triangle and rotation was next to the opposite leg.   Additionally patient had history of osteomyelitis in the past and out of the tibia and fibula.  Vitamin D deficiency protein calorie malnourishment, sepsis, pancreatitis, V. tach, metabolic acidosis, chronic pain with Duragesic patch also oral Dilaudid 2 mg pills, malabsorption, chronic alcoholic pancreatitis currently not drinking.  Patient was being treated with a cam boot and  nonweightbearing.  Patient is here with her husband.   Review of Systems all systems updated unchanged from surgery date.     Objective: Vital Signs: BP 130/80   Pulse (!) 105   Ht 5' 8"$  (1.727 m)   Wt 92 lb (41.7 kg)   LMP 03/10/2009   BMI 13.99 kg/m    Physical Exam Constitutional:      Appearance: She is well-developed.     Comments: Thin malnourished appearance  HENT:     Head: Normocephalic.     Right Ear: External ear normal.     Left Ear: External ear normal. There is no impacted cerumen.  Eyes:     Pupils: Pupils are equal, round, and reactive to light.  Neck:     Thyroid: No thyromegaly.     Trachea: No tracheal deviation.  Cardiovascular:     Rate and Rhythm: Normal rate.  Pulmonary:     Effort: Pulmonary effort is normal.  Abdominal:     Palpations: Abdomen is soft.  Musculoskeletal:     Cervical back: No rigidity.  Skin:    General: Skin is warm and dry.  Neurological:     Mental Status: She is alert and oriented to person, place, and time.  Psychiatric:        Behavior: Behavior normal.        Ortho Exam tibial nail increased but proximal interlock areas closed with staples look good no cellulitis.  Patient remains with distal medial ulceration from blistering medial ankle.  Anterior skin over the ankle has now improved no erythema.  Distal pulses are intact.  Seshan of the dorsum of the foot is intact.  In the sitting position patient has 35 degrees internal rotation of the ankle with 10 to 15 degrees in the malleolar axis with fibula anterior to the medial malleolus.  Opposite leg in sitting position is in neutral position.  With knee extension and slight external rotation pressure patella aligns with ankle.  There appears to be motion at the fracture site with slight rotation around the nail.   Specialty Comments:  No specialty comments available.   Imaging: XR Tibia/Fibula Left   Result Date: 05/05/2022 AP lateral tib-fib  x-rays demonstrate  proximal fibular fracture involving head and neck with tibial nail proximal interlocks.  Distal third tibial oblique shaft fracture noted.  Slight mismatch and shaft diameter at the fracture site suggest rotation. Impression: Left tib-fib fracture post tibial nailing.      PMFS History:     Patient Active Problem List    Diagnosis Date Noted   Displaced comminuted fracture of shaft of left tibia, initial encounter for closed fracture 04/21/2022   Closed fracture of proximal end of left fibula, initial encounter 04/21/2022   Leukocytosis 04/21/2022   Influenza A 04/06/2022   History of gastric ulcer 03/23/2022   Abnormal loss of weight 03/23/2022   Aspiration pneumonia of both lungs ([place]) 03/23/2022   Acute on chronic diastolic CHF (congestive heart failure) ([place]) 03/23/2022   NSVT (nonsustained ventricular tachycardia) ([place]) 03/20/2022   Localized swelling of right upper extremity 03/19/2022   Cellulitis of left lower extremity 03/17/2022   Hypophosphatemia 03/17/2022   Anemia of chronic disease 03/17/2022   Sepsis ([place]) 03/14/2022   Severe sepsis ([place]) 03/13/2022   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   Abnormal CT of the abdomen 01/19/2022   Chronic gastric ulcer with perforation ([place]) [id number]   Alcoholic cirrhosis of liver without ascites ([place]) 01/18/2022   Occult blood in stools 01/18/2022   Portal hypertensive gastropathy ([place]) 01/18/2022   Anasarca 01/12/2022   Acute respiratory failure with hypoxia ([place]) 01/11/2022   Hypoalbuminemia 01/11/2022   Pleural effusion 01/10/2022   Abdominal distention 01/10/2022   Physical deconditioning 01/09/2022   Pressure injury of skin 01/08/2022   Hypokalemia 01/07/2022   Hypomagnesemia 01/07/2022   AKI (acute kidney injury) ([place]) 01/07/2022   Severe protein-calorie malnutrition ([place]) 01/07/2022   Cellulitis of right foot 01/07/2022   Normocytic anemia 01/07/2022   Osteomyelitis ([place]) 01/06/2022   Intractable nausea and vomiting  [id number]   Metabolic acidosis, increased anion gap 06/12/2020   Seizure ([place]) 05/08/2017   Chronic pain syndrome 05/08/2017   Gastroesophageal reflux disease without esophagitis 05/08/2017   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[place]) 05/08/2017   Pancreatic steatorrhea 05/08/2017   Osteoporosis 03/16/2011   Endometriosis     Malabsorption     Chronic pancreatitis ([place])     Vitamin D deficiency          Past Medical History:  Diagnosis Date   Alcoholic cirrhosis ([place])     Chronic pancreatitis ([place])     Diabetes mellitus without complication ([place])     Endometriosis     GERD (gastroesophageal reflux disease)     Intractable nausea and vomiting 06/13/2020   Liver disease     Malabsorption      MALABSORPTION SYNDROME   Osteoporosis 03/2011    t score -2.5   Pancreatitis      due to cyst and tumors due to calcifications   Seizure ([facility])      no seizure disorder, controlled w/ meds per patient   Vitamin D deficiency 2012    VIT D 15         Family History  Problem Relation Age of Onset   Cancer Father          BONE   Breast cancer Maternal Grandmother           Past Surgical History:  Procedure Laterality Date   APPENDECTOMY       BIOPSY   01/19/2022    Procedure: BIOPSY;  Surgeon: Lavena Bullion, DO;  Location: [facility];  Service: Gastroenterology;;   [facility]  ESOPHAGOGASTRODUODENOSCOPY (EGD) WITH PROPOFOL N/A 01/19/2022    Procedure: ESOPHAGOGASTRODUODENOSCOPY (EGD) WITH PROPOFOL;  Surgeon: Lavena Bullion, DO;  Location: [facility];  Service: Gastroenterology;  Laterality: N/A;   FEEDING TUBE RELOCATION   2010   JEJUNOSTOMY FEEDING TUBE   1990   MULTIPLE GASTRIC SURGERIES       MYOMECTOMY       OPEN REDUCTION INTERNAL FIXATION (ORIF) DISTAL RADIAL FRACTURE Left 06/29/2018    Procedure: OPEN REDUCTION INTERNAL FIXATION (ORIF)LEFT  DISTAL RADIAL FRACTURE;  Surgeon: Leanora Cover, MD;  Location: [facility];  Service: Orthopedics;   Laterality: Left;   ROUX-EN-Y GASTRIC BYPASS       TIBIA IM NAIL INSERTION Left 04/21/2022    Procedure: LEFT INTRAMEDULLARY (IM) NAIL TIBIAL;  Surgeon: Marybelle Killings, MD;  Location: [facility];  Service: Orthopedics;  Laterality: Left;   TUBAL LIGATION        Social History         Occupational History   Not on file  Tobacco Use   Smoking status: Never   Smokeless tobacco: Never  Vaping Use   Vaping Use: Never used  Substance and Sexual Activity   Alcohol use: Yes      Comment: occ ( none in several weeks)   Drug use: No   Sexual activity: Not Currently      Partners: Male      Birth control/protection: Post-menopausal                     Electronically signed by Marybelle Killings, MD at 05/05/2022  8:01 AM

## 2022-05-05 NOTE — Anesthesia Postprocedure Evaluation (Signed)
Anesthesia Post Note  Patient: Carolyn [name]  Procedure(s) Performed: LEFT TIBIAL NAIL DISTAL SCREW PLACEMENT (Left)     Patient location during evaluation: PACU Anesthesia Type: General Level of consciousness: awake and alert Pain management: pain level controlled Vital Signs Assessment: post-procedure vital signs reviewed and stable Respiratory status: spontaneous breathing, nonlabored ventilation, respiratory function stable and patient connected to nasal cannula oxygen Cardiovascular status: blood pressure returned to baseline and stable Postop Assessment: no apparent nausea or vomiting Anesthetic complications: no  No notable events documented.  Last Vitals:  Vitals:   05/05/22 1630 05/05/22 1645  BP: 123/70 114/71  Pulse: 81 71  Resp: 13 10  Temp: (!) 36.4 C   SpO2: 100% 100%    Last Pain:  Vitals:   05/05/22 1630  TempSrc:   PainSc: 0-No pain                 Barnet Glasgow

## 2022-05-06 ENCOUNTER — Other Ambulatory Visit: Payer: Self-pay | Admitting: Radiology

## 2022-05-06 ENCOUNTER — Encounter (HOSPITAL_COMMUNITY): Payer: Self-pay | Admitting: Orthopaedic Surgery

## 2022-05-06 DIAGNOSIS — S82252A Displaced comminuted fracture of shaft of left tibia, initial encounter for closed fracture: Secondary | ICD-10-CM

## 2022-05-06 DIAGNOSIS — S82832A Other fracture of upper and lower end of left fibula, initial encounter for closed fracture: Secondary | ICD-10-CM

## 2022-05-06 LAB — POCT I-STAT, CHEM 8
BUN: 25 mg/dL — ABNORMAL HIGH (ref 8–23)
Calcium, Ion: 1.18 mmol/L (ref 1.15–1.40)
Chloride: 102 mmol/L (ref 98–111)
Creatinine, Ser: 0.5 mg/dL (ref 0.44–1.00)
Glucose, Bld: 98 mg/dL (ref 70–99)
HCT: 30 % — ABNORMAL LOW (ref 36.0–46.0)
Hemoglobin: 10.2 g/dL — ABNORMAL LOW (ref 12.0–15.0)
Potassium: 3.2 mmol/L — ABNORMAL LOW (ref 3.5–5.1)
Sodium: 141 mmol/L (ref 135–145)
TCO2: 26 mmol/L (ref 22–32)

## 2022-05-06 LAB — GLUCOSE, CAPILLARY
Glucose-Capillary: 170 mg/dL — ABNORMAL HIGH (ref 70–99)
Glucose-Capillary: 171 mg/dL — ABNORMAL HIGH (ref 70–99)

## 2022-05-06 NOTE — Progress Notes (Cosign Needed)
    Durable Medical Equipment  (From admission, onward)           Start     Ordered   05/06/22 1003  For home use only DME Bedside commode  Once       Comments: Confine to one room  Question:  Patient needs a bedside commode to treat with the following condition  Answer:  Fx   05/06/22 1002

## 2022-05-06 NOTE — Progress Notes (Signed)
Patient ID: Carolyn [name], female   DOB: [birth year], 67 y.o.   MRN: [id number]   Subjective: 1 Day Post-Op Procedure(s) (LRB): LEFT TIBIAL NAIL DISTAL SCREW PLACEMENT (Left) Patient  pain   difficult to determine .  Smiling , talking while nurse is pushing IV morphine. Already on duragesic patch and dilaudid pre-admission.   Objective: Vital signs in last 24 hours: Temp:  [97.5 F (36.4 C)-98.2 F (36.8 C)] 97.5 F (36.4 C) (02/22 0740) Pulse Rate:  [71-107] 88 (02/22 0740) Resp:  [10-20] 17 (02/22 0740) BP: (114-153)/(70-95) 153/95 (02/22 0740) SpO2:  [97 %-100 %] 100 % (02/22 0740) Weight:  [41.7 kg] 41.7 kg (02/21 1306)  Intake/Output from previous day: 02/21 0701 - 02/22 0700 In: [id number] [I.V.:863; IV Piggyback:450] Out: 10 [Blood:10] Intake/Output this shift: Total I/O In: -  Out: 300 [Urine:300]  Recent Labs    05/05/22 1405  HGB 10.2*   Recent Labs    05/05/22 1405  HCT 30.0*   Recent Labs    05/05/22 1405  NA 141  K 3.2*  CL 102  BUN 25*  CREATININE 0.50  GLUCOSE 98   No results for input(s): "LABPT", "INR" in the last 72 hours.  PE:  leg rotation symetrical. DG Tibia/Fibula Left  Result Date: 05/05/2022 CLINICAL DATA:  Placement of distal tibial nail screw. EXAM: LEFT TIBIA AND FIBULA - 2 VIEW COMPARISON:  Left tibia/fibular radiographs 05/04/2022 FLUOROSCOPY: Radiation Exposure Index (as provided by the fluoroscopic device): 1.07 mGy Kerma FINDINGS: Three intraoperative spot fluoroscopic images are provided and demonstrate placement of an interlocking screw in a pre-existing intramedullary nail in the distal tibia. IMPRESSION: Intraoperative images as above. Electronically Signed   By: Logan Bores M.D.   On: 05/05/2022 16:27   DG C-Arm 1-60 Min-No Report  Result Date: 05/05/2022 Fluoroscopy was utilized by the requesting physician.  No radiographic interpretation.    Assessment/Plan: 1 Day Post-Op Procedure(s) (LRB): LEFT TIBIAL NAIL DISTAL SCREW  PLACEMENT (Left) Plan: discharge after therapy this AM. TDWB  Marybelle Killings 05/06/2022, 7:47 AM

## 2022-05-06 NOTE — Discharge Summary (Signed)
Physician Discharge Summary  Patient ID: Carolyn [name] MRN: [id number] DOB/AGE: 04-24-1955 67 y.o.  Admit date: 05/05/2022 Discharge date: 05/06/2022  Admission Diagnoses:tibial fracture malrotation  Discharge Diagnoses:  Principal Problem:   Closed fracture of left tibia and fibula   Discharged Condition: good  Hospital Course: Patient was admitted after informed consent and underwent placement of a single distal interlock screw above the ankle through a 1 cm incision correcting the internal rotation deformity.  Patient had skin condition with ulcers and blisters formation along the distal medial left leg that prevented more than 1 interlock screw being placed.  Patient was touchdown weightbearing office follow-up 1 week.  All lower extremity leg wounds are dressed in the operating room.  Consults:  Physical therapy  Significant Diagnostic Studies:  Treatments: surgery: As above.  Placement of the distal anterior to posterior interlock screw.  Discharge Exam: Blood pressure (!) 153/95, pulse 88, temperature (!) 97.5 F (36.4 C), temperature source Oral, resp. rate 17, height 5' 8"$  (1.727 m), weight 41.7 kg, last menstrual period 03/10/2009, SpO2 100 %. PE: Tibial rotation symmetrical postop.  Disposition: Discharge disposition: 01-Home or Self Care          Follow-up Information     Marybelle Killings, MD Follow up in 1 week(s).   Specialty: Orthopedic Surgery Contact information: [address] [place] [address] [phone]                 Signed: Marybelle Killings 05/06/2022, 7:54 AM

## 2022-05-06 NOTE — Evaluation (Signed)
Occupational Therapy Evaluation Patient Details Name: Carolyn [name] MRN: [id number] DOB: 1955/08/22 Today's Date: 05/06/2022   History of Present Illness Pt is a 67 y.o. female admitted 2/21 from [facility] rehab with recent L closed tib-fib fracture and L tibial nail with proximal interlock 2/7. Pt found to have rotation at the fracture site around the nail. Now s/p Closed de-rotation and L tibial nail distal interlock screw placement 2/21. PMH: alcoholism with chronic pancreatitis, malabsorption, cirrhosis, osteoporosis.   Clinical Impression   PTA, pt recently at [facility] for rehabilitation and about to be discharged home. Upon eval, pt performing functional mobility with supervision and intermittent cues to maintain TDWB precautions. Pt performing LB ADL with up to min guard A due to decr balance with weightbearing status. Pt educated and demonstrating use of compensatory techniques for functional mobility, LB ADL, tub/shower transfer with transfer bench. Recommending discharge home with HHOT to optimize safety and independence in ADL and IADL      Recommendations for follow up therapy are one component of a multi-disciplinary discharge planning process, led by the attending physician.  Recommendations may be updated based on patient status, additional functional criteria and insurance authorization.   Follow Up Recommendations  Home health OT     Assistance Recommended at Discharge Intermittent Supervision/Assistance  Patient can return home with the following A little help with walking and/or transfers;A little help with bathing/dressing/bathroom;Assistance with cooking/housework;Assist for transportation;Help with stairs or ramp for entrance    Functional Status Assessment  Patient has had a recent decline in their functional status and demonstrates the ability to make significant improvements in function in a reasonable and predictable amount of time.  Equipment Recommendations   BSC/3in1    Recommendations for Other Services       Precautions / Restrictions Precautions Precautions: Fall Precaution Comments: incontinence Restrictions Weight Bearing Restrictions: Yes LLE Weight Bearing: Touchdown weight bearing      Mobility Bed Mobility               General bed mobility comments: in recliner on arrival and departure    Transfers Overall transfer level: Needs assistance Equipment used: Rolling walker (2 wheels) Transfers: Sit to/from Stand Sit to Stand: Supervision           General transfer comment: supervision for safety and intermittent cueing for TDWB      Balance Overall balance assessment: Needs assistance Sitting-balance support: Feet supported, No upper extremity supported Sitting balance-Leahy Scale: Good     Standing balance support: Reliant on assistive device for balance, Single extremity supported Standing balance-Leahy Scale: Poor Standing balance comment: With TDWB single hand uspport                           ADL either performed or assessed with clinical judgement   ADL Overall ADL's : Needs assistance/impaired Eating/Feeding: Independent;Sitting   Grooming: Standing;Min guard Grooming Details (indicate cue type and reason): balance/weightbearing Upper Body Bathing: Set up;Sitting   Lower Body Bathing: Sit to/from stand;Min guard   Upper Body Dressing : Sitting;Set up   Lower Body Dressing: Min guard;Sit to/from stand   Toilet Transfer: Ambulation;Rolling walker (2 wheels);BSC/3in1;Supervision/safety Toilet Transfer Details (indicate cue type and reason): supervision A for safety and cues for maintenance of weightbearing status Toileting- Clothing Manipulation and Hygiene: Supervision/safety;Sitting/lateral lean   Tub/ Shower Transfer: Tub transfer;Tub bench;Rolling walker (2 wheels);Supervision/safety Tub/Shower Transfer Details (indicate cue type and reason): cueing for technique Functional  mobility during ADLs:  Supervision/safety;Rolling walker (2 wheels) General ADL Comments: cues for maintenance of weightbearing precautions, RW, LLE, RLE sequence     Vision Baseline Vision/History: 0 No visual deficits Ability to See in Adequate Light: 0 Adequate Patient Visual Report: No change from baseline Vision Assessment?: No apparent visual deficits     Perception Perception Perception Tested?: No   Praxis Praxis Praxis tested?: Not tested    Pertinent Vitals/Pain Pain Assessment Pain Assessment: Faces Faces Pain Scale: Hurts little more Pain Location: LLE with mvmt Pain Descriptors / Indicators: Operative site guarding Pain Intervention(s): Limited activity within patient's tolerance, Monitored during session     Hand Dominance Right   Extremity/Trunk Assessment Upper Extremity Assessment Upper Extremity Assessment: Generalized weakness   Lower Extremity Assessment Lower Extremity Assessment: Defer to PT evaluation LLE Deficits / Details: Lt ankle immobilized   Cervical / Trunk Assessment Cervical / Trunk Assessment: Kyphotic (mild)   Communication Communication Communication: No difficulties   Cognition Arousal/Alertness: Awake/alert Behavior During Therapy: WFL for tasks assessed/performed Overall Cognitive Status: No family/caregiver present to determine baseline cognitive functioning                                       General Comments  Pt educated on positioning at rest    Exercises     Shoulder Instructions      Home Living Family/patient expects to be discharged to:: Private residence Living Arrangements: Spouse/significant other Available Help at Discharge: Family;Available 24 hours/day Type of Home: House Home Access: Stairs to enter [employer] of Steps: 6 Entrance Stairs-Rails: Right Home Layout: One level     Bathroom Shower/Tub: Tub/shower unit         Home Equipment: Conservation officer, nature (2 wheels);Grab  bars - toilet   Additional Comments: Pt reports husband is obtaining a tub bench      Prior Functioning/Environment Prior Level of Function : History of Falls (last six months);Needs assist             Mobility Comments: Was about to be d/c from [facility] ADLs Comments: supervision+ for ADL        OT Problem List: Decreased strength;Decreased activity tolerance;Impaired balance (sitting and/or standing);Decreased knowledge of use of DME or AE;Decreased knowledge of precautions      OT Treatment/Interventions: Self-care/ADL training;Therapeutic exercise;DME and/or AE instruction;Patient/family education;Balance training;Therapeutic activities    OT Goals(Current goals can be found in the care plan section) Acute Rehab OT Goals Patient Stated Goal: get stronger OT Goal Formulation: With patient Time For Goal Achievement: 05/20/22 Potential to Achieve Goals: Good  OT Frequency: Min 2X/week    Co-evaluation              AM-PAC OT "6 Clicks" Daily Activity     Outcome Measure Help from another person eating meals?: None Help from another person taking care of personal grooming?: A Little Help from another person toileting, which includes using toliet, bedpan, or urinal?: A Little Help from another person bathing (including washing, rinsing, drying)?: A Little Help from another person to put on and taking off regular upper body clothing?: A Little Help from another person to put on and taking off regular lower body clothing?: A Little 6 Click Score: 19   End of Session Equipment Utilized During Treatment: Gait belt;Rolling walker (2 wheels);Other (comment) (CAM boot) Nurse Communication: Mobility status  Activity Tolerance: Patient tolerated treatment well Patient left: in chair;with call bell/phone  within reach;with chair alarm set  OT Visit Diagnosis: Unsteadiness on feet (R26.81);Muscle weakness (generalized) (M62.81);Other abnormalities of gait and mobility  (R26.89)                Time: 0933-1000 OT Time Calculation (min): 27 min Charges:  OT General Charges $OT Visit: 1 Visit OT Evaluation $OT Eval Low Complexity: 1 Low OT Treatments $Self Care/Home Management : 8-22 mins  Elder Cyphers, OTR/L [facility] Acute Rehabilitation Office: [phone]   Magnus Ivan 05/06/2022, 10:33 AM

## 2022-05-06 NOTE — TOC Transition Note (Signed)
Transition of Care [facility]) - CM/SW Discharge Note   Patient Details  Name: Carolyn [name] MRN: [id number] Date of Birth: 12/10/1955  Transition of Care [facility]) CM/SW Contact:  Sharin Mons, RN Phone Number: 05/06/2022, 12:34 PM   Clinical Narrative:    Patient will DC to: home  Anticipated DC date: 05/06/2022 Family notified: yes Transport by: car       - S/p Closed de-rotation and L tibial nail distal interlock screw placement 2/21 Per MD patient ready for DC today.Pt is from home with husband, [address] ,[place], [place] . Husband states they are looking @ [place] for residence. RN, patient, patient's husband aware of d/c plan. Pt declined home health. Requesting outpatient PT services, MD made aware. Post hospital f/u noted on AVS. DME : BSC, referral made with  [facility]. Equipment will be delivered to bedside prior to d/c. Pt without RX med concerns. Post hospital f/u noted on AVS. Husband to provide transportation to home.  RNCM will sign off for now as intervention is no longer needed. Please consult [place] again if new needs arise.    Final next level of care: Home/Self Care Barriers to Discharge: No Barriers Identified   Patient Goals and CMS Choice   Choice offered to / list presented to : Patient  Discharge Placement                         Discharge Plan and Services Additional resources added to the After Visit Summary for                  DME Arranged: Bedside commode   Date DME Agency Contacted: 05/06/22 Time DME Agency Contacted: 1005 Representative spoke with at DME Agency: [place] ([place]) Interventions SDOH Screenings   Food Insecurity: No Food Insecurity (05/05/2022)  Housing: Low Risk  (05/05/2022)  Transportation Needs: No Transportation Needs (05/05/2022)  Utilities: Not At Risk (05/05/2022)  Recent Concern: Utilities - At Risk (03/15/2022)  Depression (PHQ2-9): Low Risk  (07/22/2020)   Tobacco Use: Low Risk  (05/05/2022)     Readmission Risk Interventions     No data to display

## 2022-05-06 NOTE — Discharge Instructions (Signed)
See Dr. Lorin Mercy in one week.

## 2022-05-06 NOTE — Progress Notes (Signed)
Physical Therapy Evaluation Patient Details Name: Carolyn [name] MRN: [id number] DOB: 1955-12-25 Today's Date: 05/06/2022  History of Present Illness  Pt is a 67 y.o. female admitted 2/21 from [place] rehab with recent L closed tib-fib fracture and L tibial nail with proximal interlock 2/7. Pt found to have rotation at the fracture site around the nail. Now s/p Closed de-rotation and L tibial nail distal interlock screw placement 2/21. PMH: alcoholism with chronic pancreatitis, malabsorption, cirrhosis, osteoporosis.  Clinical Impression  Patient is s/p above surgery resulting in functional limitations due to the deficits listed below (see PT Problem List). Educated on AD use, gait training, stair navigation, and weight bearing precautions. Pt reports nearing d/c from [place], has 24 hr assist available from husband. Agreeable to HHPT at d/c. Safely navigates stairs, handout provided. No physical assist required to mobilize during therapy session today. Patient will benefit from skilled PT to increase their independence and safety with mobility to allow discharge to the venue listed below.          Recommendations for follow up therapy are one component of a multi-disciplinary discharge planning process, led by the attending physician.  Recommendations may be updated based on patient status, additional functional criteria and insurance authorization.  Follow Up Recommendations Home health PT Can patient physically be transported by private vehicle: Yes    Assistance Recommended at Discharge Set up Supervision/Assistance  Patient can return home with the following  A little help with walking and/or transfers;A little help with bathing/dressing/bathroom;Assistance with cooking/housework;Assist for transportation;Help with stairs or ramp for entrance    Equipment Recommendations None recommended by PT  Recommendations for Other Services       Functional Status Assessment Patient has had a  recent decline in their functional status and demonstrates the ability to make significant improvements in function in a reasonable and predictable amount of time.     Precautions / Restrictions Precautions Precautions: Fall Precaution Comments: incontinence Restrictions Weight Bearing Restrictions: Yes LLE Weight Bearing: Weight bearing as tolerated      Mobility  Bed Mobility Overal bed mobility: Modified Independent             General bed mobility comments: Mod I no assist required.    Transfers Overall transfer level: Needs assistance Equipment used: Rolling walker (2 wheels) Transfers: Sit to/from Stand Sit to Stand: Supervision           General transfer comment: Supervision for safety. Demo'd technique, cues for TTDWB through LLE. Able to perform from bed, recliner, and BSC without physical assist using RW for support upon standing.    Ambulation/Gait Ambulation/Gait assistance: Min guard Gait Distance (Feet): 20 Feet (Total of 50 between different tasks.) Assistive device: Rolling walker (2 wheels) Gait Pattern/deviations: Step-to pattern Gait velocity: decreased Gait velocity interpretation: <1.31 ft/sec, indicative of household ambulator   General Gait Details: Educated on safe AD use with RW. Cues for TDWB on LLE at all times. nvaigated around room and therapy gym in various intervals between tasks. No evidence of LOB while using device. Min guard for safety.  Stairs Stairs: Yes Stairs assistance: Min assist Stair Management: No rails, Step to pattern, Backwards, With walker Number of Stairs: 6 General stair comments: Educated on safe AD use with RW on stairs using posterior approach. Min assist to block RW only. Able to adequately perform within WB precautions. Handout provided. Able to teach back sequencing.  Wheelchair Mobility    Modified Rankin (Stroke Patients Only)  Balance Overall balance assessment: Needs  assistance Sitting-balance support: Feet supported, No upper extremity supported Sitting balance-Leahy Scale: Good     Standing balance support: Reliant on assistive device for balance, Single extremity supported Standing balance-Leahy Scale: Poor Standing balance comment: With TDWB single hand uspport                             Pertinent Vitals/Pain Pain Assessment Pain Assessment: Faces Faces Pain Scale: Hurts little more Pain Location: LLE with mvmt Pain Descriptors / Indicators: Operative site guarding Pain Intervention(s): Monitored during session, Repositioned    Home Living Family/patient expects to be discharged to:: Private residence Living Arrangements: Spouse/significant other Available Help at Discharge: Family;Available 24 hours/day Type of Home: House Home Access: Stairs to enter Entrance Stairs-Rails: Right Entrance Stairs-Number of Steps: 6   Home Layout: One level Home Equipment: Rolling Walker (2 wheels);BSC/3in1;Grab bars - toilet      Prior Function Prior Level of Function : History of Falls (last six months);Needs assist             Mobility Comments: Was about to be d/c from [facility]       Hand Dominance   Dominant Hand: Right    Extremity/Trunk Assessment   Upper Extremity Assessment Upper Extremity Assessment: Defer to OT evaluation    Lower Extremity Assessment Lower Extremity Assessment: LLE deficits/detail (gross atrophy bil LEs) LLE Deficits / Details: Lt ankle immobilized    Cervical / Trunk Assessment Cervical / Trunk Assessment: Kyphotic (mild)  Communication   Communication: No difficulties  Cognition Arousal/Alertness: Awake/alert Behavior During Therapy: WFL for tasks assessed/performed Overall Cognitive Status: No family/caregiver present to determine baseline cognitive functioning                                          General Comments      Exercises     Assessment/Plan     PT Assessment Patient needs continued PT services  PT Problem List Decreased strength;Decreased range of motion;Decreased activity tolerance;Decreased balance;Decreased mobility;Decreased knowledge of use of DME;Pain       PT Treatment Interventions DME instruction;Gait training;Functional mobility training;Therapeutic activities;Therapeutic exercise;Balance training;Neuromuscular re-education;Patient/family education;Cognitive remediation;Stair training    PT Goals (Current goals can be found in the Care Plan section)  Acute Rehab PT Goals Patient Stated Goal: walk independently, return home to her husband and 3 dogs PT Goal Formulation: With patient Time For Goal Achievement: 05/13/22 Potential to Achieve Goals: Good    Frequency Min 3X/week     Co-evaluation               AM-PAC PT "6 Clicks" Mobility  Outcome Measure Help needed turning from your back to your side while in a flat bed without using bedrails?: None Help needed moving from lying on your back to sitting on the side of a flat bed without using bedrails?: None Help needed moving to and from a bed to a chair (including a wheelchair)?: A Little Help needed standing up from a chair using your arms (e.g., wheelchair or bedside chair)?: A Little Help needed to walk in hospital room?: A Little Help needed climbing 3-5 steps with a railing? : A Little 6 Click Score: 20    End of Session Equipment Utilized During Treatment: Gait belt Activity Tolerance: Patient tolerated treatment well Patient left: in chair;with call  bell/phone within reach;with chair alarm set Nurse Communication: Mobility status PT Visit Diagnosis: Pain;Difficulty in walking, not elsewhere classified (R26.2);History of falling (Z91.81);Other abnormalities of gait and mobility (R26.89) Pain - Right/Left: Left Pain - part of body: Leg    Time: [id number] PT Time Calculation (min) (ACUTE ONLY): 34 min   Charges:   PT Evaluation $PT Eval Low  Complexity: 1 Low PT Treatments $Gait Training: 8-22 mins        Candie Mile, PT, DPT Physical Therapist Acute Rehabilitation Services [place]   Ellouise Newer 05/06/2022, 10:03 AM

## 2022-05-07 ENCOUNTER — Telehealth: Payer: Self-pay

## 2022-05-07 ENCOUNTER — Telehealth: Payer: Self-pay | Admitting: Orthopaedic Surgery

## 2022-05-07 NOTE — Telephone Encounter (Signed)
noted 

## 2022-05-07 NOTE — Telephone Encounter (Signed)
Carolyn [name] with [facility] called [phone] advised that pt has a wound at the ankle and wants to know if they should continue with dressings and what Dr. Lorin Mercy would like for them to do?

## 2022-05-07 NOTE — Telephone Encounter (Signed)
Please advise 

## 2022-05-10 ENCOUNTER — Other Ambulatory Visit: Payer: Self-pay

## 2022-05-10 ENCOUNTER — Encounter: Payer: Self-pay | Admitting: Physical Therapy

## 2022-05-10 ENCOUNTER — Ambulatory Visit (INDEPENDENT_AMBULATORY_CARE_PROVIDER_SITE_OTHER): Payer: Medicare PPO | Admitting: Physical Therapy

## 2022-05-10 DIAGNOSIS — M6281 Muscle weakness (generalized): Secondary | ICD-10-CM

## 2022-05-10 NOTE — Therapy (Signed)
OUTPATIENT PHYSICAL THERAPY LOWER EXTREMITY EVALUATION   Patient Name: Carolyn [name] MRN: [id number] DOB:04-11-1955, 67 y.o., female Today's Date: 05/10/2022  END OF SESSION:  PT End of Session - 05/10/22 1041     Visit Number 1    PT Start Time [id number]    PT Stop Time 1025    PT Time Calculation (min) 10 min             Past Medical History:  Diagnosis Date   Alcoholic cirrhosis ([place])    Chronic pancreatitis ([place])    Diabetes mellitus without complication ([place])    Endometriosis    GERD (gastroesophageal reflux disease)    Intractable nausea and vomiting 06/13/2020   Liver disease    Malabsorption    MALABSORPTION SYNDROME   Osteoporosis 03/2011   t score -2.5   Pancreatitis    due to cyst and tumors due to calcifications   Seizure ([place])    no seizure disorder, controlled w/ meds per patient   Vitamin D deficiency 2012   VIT D 15   Past Surgical History:  Procedure Laterality Date   APPENDECTOMY     BIOPSY  01/19/2022   Procedure: BIOPSY;  Surgeon: Lavena Bullion, DO;  Location: [facility];  Service: Gastroenterology;;   CHOLECYSTECTOMY     ESOPHAGOGASTRODUODENOSCOPY (EGD) WITH PROPOFOL N/A 01/19/2022   Procedure: ESOPHAGOGASTRODUODENOSCOPY (EGD) WITH PROPOFOL;  Surgeon: Lavena Bullion, DO;  [facility];  Service: Gastroenterology;  Laterality: N/A;   FEEDING TUBE RELOCATION  2010   JEJUNOSTOMY FEEDING TUBE  1990   MULTIPLE GASTRIC SURGERIES     MYOMECTOMY     OPEN REDUCTION INTERNAL FIXATION (ORIF) DISTAL RADIAL FRACTURE Left 06/29/2018   Procedure: OPEN REDUCTION INTERNAL FIXATION (ORIF)LEFT  DISTAL RADIAL FRACTURE;  Surgeon: Leanora Cover, MD;  Location: [facility];  Service: Orthopedics;  Laterality: Left;   ROUX-EN-Y GASTRIC BYPASS     TIBIA IM NAIL INSERTION Left 04/21/2022   Procedure: LEFT INTRAMEDULLARY (IM) NAIL TIBIAL;  Surgeon: Marybelle Killings, MD;  Location: [facility];  Service: Orthopedics;  Laterality: Left;   TIBIA IM  NAIL INSERTION Left 05/05/2022   Procedure: LEFT TIBIAL NAIL DISTAL SCREW PLACEMENT;  Surgeon: Marybelle Killings, MD;  Location: [facility];  Service: Orthopedics;  Laterality: Left;   TUBAL LIGATION     Patient Active Problem List   Diagnosis Date Noted   Closed fracture of left tibia and fibula 05/05/2022   Displaced comminuted fracture of shaft of left tibia, initial encounter for closed fracture 04/21/2022   Closed fracture of proximal end of left fibula, initial encounter 04/21/2022   Leukocytosis 04/21/2022   Influenza A 04/06/2022   History of gastric ulcer 03/23/2022   Abnormal loss of weight 03/23/2022   Aspiration pneumonia of both lungs ([place]) 03/23/2022   Acute on chronic diastolic CHF (congestive heart failure) ([place]) 03/23/2022   NSVT (nonsustained ventricular tachycardia) ([place]) 03/20/2022   Localized swelling of right upper extremity 03/19/2022   Cellulitis of left lower extremity 03/17/2022   Hypophosphatemia 03/17/2022   Anemia of chronic disease 03/17/2022   Sepsis ([place]) 03/14/2022   Severe sepsis ([place]) 03/13/2022   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   Abnormal CT of the abdomen 01/19/2022   Chronic gastric ulcer with perforation ([place]) [id number]   Alcoholic cirrhosis of liver without ascites ([place]) 01/18/2022   Occult blood in stools 01/18/2022   Portal hypertensive gastropathy ([place]) 01/18/2022   Anasarca 01/12/2022   Acute respiratory failure with hypoxia ([place]) 01/11/2022  Hypoalbuminemia 01/11/2022   Pleural effusion 01/10/2022   Abdominal distention 01/10/2022   Physical deconditioning 01/09/2022   Pressure injury of skin 01/08/2022   Hypokalemia 01/07/2022   Hypomagnesemia 01/07/2022   AKI (acute kidney injury) ([place]) 01/07/2022   Severe protein-calorie malnutrition ([place]) 01/07/2022   Cellulitis of right foot 01/07/2022   Normocytic anemia 01/07/2022   Osteomyelitis ([place]) 01/06/2022   Intractable nausea and vomiting [id number]   Metabolic acidosis, increased anion  gap 06/12/2020   Seizure ([place]) 05/08/2017   Chronic pain syndrome 05/08/2017   Gastroesophageal reflux disease without esophagitis 05/08/2017   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[place]) 05/08/2017   Pancreatic steatorrhea 05/08/2017   Osteoporosis 03/16/2011   Endometriosis    Malabsorption    Chronic pancreatitis ([place])    Vitamin D deficiency     PCP: General PCP  REFERRING PROVIDER: Marybelle Killings, MD  REFERRING DIAG:  S82.252A (ICD-10-CM) - Displaced comminuted fracture of shaft of left tibia, initial encounter for closed fracture  S82.832A (ICD-10-CM) - Closed fracture of proximal end of left fibula, initial encounter    THERAPY DIAG:  Muscle weakness (generalized)  Rationale for Evaluation and Treatment: Rehabilitation  ONSET DATE: 04/20/2022  SUBJECTIVE:   SUBJECTIVE STATEMENT: She had prolonged hospitalization 03/13/2022-1/29/204 with severe sepsis included hypoxic respiratory failure. She was discharged to SNF.  Patient had mechanical fall transferring left leg buckled on 04/20/2022 in SNF & sustained nondisplaced fracture of fibular head & distal tibia. Her husband questions if she injured her ankle on 04/17/22 due to painful to weight bear after that day. Surgeries 04/21/2022 Left Intramedullary nail Tibia with rod but only proximal screws due to skin issues at ankle.  She returned to SNF upon discharge.    Her ankle was turning so second surgery on 05/05/2022 to place distal screws at ankle. She was discharged back to SNF.  She is still in SNF so unable to see her in outpatient PT due to billing.  Her husband reports the team is discussing her discharge today and he will call to schedule with [place] the next business day after her discharge.    PERTINENT HISTORY: Alcoholism in remission, chronic pancreatitis, hepatic cirrhosis, She had prolonged hospitalization 03/13/2022-1/29/204 with severe sepsis included hypoxic respiratory failure. DM,   WEIGHT BEARING RESTRICTIONS: Yes LLE  TDWB    Jamey Reas, PT, DPT 05/10/2022, 10:42 AM

## 2022-05-11 ENCOUNTER — Ambulatory Visit (INDEPENDENT_AMBULATORY_CARE_PROVIDER_SITE_OTHER): Payer: Medicare PPO

## 2022-05-11 ENCOUNTER — Encounter: Payer: Self-pay | Admitting: Orthopaedic Surgery

## 2022-05-11 ENCOUNTER — Ambulatory Visit (INDEPENDENT_AMBULATORY_CARE_PROVIDER_SITE_OTHER): Payer: Medicare PPO | Admitting: Orthopaedic Surgery

## 2022-05-11 VITALS — BP 120/82 | HR 116 | Ht 68.0 in | Wt 91.0 lb

## 2022-05-11 DIAGNOSIS — S82252A Displaced comminuted fracture of shaft of left tibia, initial encounter for closed fracture: Secondary | ICD-10-CM

## 2022-05-11 NOTE — Progress Notes (Signed)
Post-Op Visit Note   Patient: Carolyn [name]           Date of Birth: 22-Jun-1955           MRN: [id number] Visit Date: 05/11/2022 PCP: Pcp, No   Assessment & Plan: Follow-up distal tibial nail distal interlock screw placement anteriorly.  Patient still has ulcer medially over the distal aspect of the medial leg where the unilateral interlock screw would have been placed.  She has been in her cam boot.  Proximal staples are removed and proximal interlock screw placement staple areas look good.  Return 1 week for distal interlock staple removal.  She will start some therapy touchdown weightbearing.  Chief Complaint:  Chief Complaint  Patient presents with   Left Leg - Routine Post Op    05/05/2022 left tibial nail distal screw placement   Visit Diagnoses:  1. Displaced comminuted fracture of shaft of left tibia, initial encounter for closed fracture     Plan: Rotation right left tibia appears symmetrical.  Proximal staples removed she will return in a week for the distal staples.  Continue daily dressing changes over the medial wound.  Follow-Up Instructions: No follow-ups on file.   Orders:  Orders Placed This Encounter  Procedures   XR Tibia/Fibula Left   No orders of the defined types were placed in this encounter.   Imaging: No results found.  PMFS History: Patient Active Problem List   Diagnosis Date Noted   Closed fracture of left tibia and fibula 05/05/2022   Displaced comminuted fracture of shaft of left tibia, initial encounter for closed fracture 04/21/2022   Closed fracture of proximal end of left fibula, initial encounter 04/21/2022   Leukocytosis 04/21/2022   Influenza A 04/06/2022   History of gastric ulcer 03/23/2022   Abnormal loss of weight 03/23/2022   Aspiration pneumonia of both lungs ([place]) 03/23/2022   Acute on chronic diastolic CHF (congestive heart failure) ([place]) 03/23/2022   NSVT (nonsustained ventricular tachycardia) ([place]) 03/20/2022    Localized swelling of right upper extremity 03/19/2022   Cellulitis of left lower extremity 03/17/2022   Hypophosphatemia 03/17/2022   Anemia of chronic disease 03/17/2022   Sepsis ([place]) 03/14/2022   Severe sepsis ([place]) 03/13/2022   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   Abnormal CT of the abdomen 01/19/2022   Chronic gastric ulcer with perforation ([place]) [id number]   Alcoholic cirrhosis of liver without ascites ([place]) 01/18/2022   Occult blood in stools 01/18/2022   Portal hypertensive gastropathy ([place]) 01/18/2022   Anasarca 01/12/2022   Acute respiratory failure with hypoxia ([place]) 01/11/2022   Hypoalbuminemia 01/11/2022   Pleural effusion 01/10/2022   Abdominal distention 01/10/2022   Physical deconditioning 01/09/2022   Pressure injury of skin 01/08/2022   Hypokalemia 01/07/2022   Hypomagnesemia 01/07/2022   AKI (acute kidney injury) ([place]) 01/07/2022   Severe protein-calorie malnutrition ([place]) 01/07/2022   Cellulitis of right foot 01/07/2022   Normocytic anemia 01/07/2022   Osteomyelitis ([place]) 01/06/2022   Intractable nausea and vomiting [id number]   Metabolic acidosis, increased anion gap 06/12/2020   Seizure ([place]) 05/08/2017   Chronic pain syndrome 05/08/2017   Gastroesophageal reflux disease without esophagitis 05/08/2017   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[place]) 05/08/2017   Pancreatic steatorrhea 05/08/2017   Osteoporosis 03/16/2011   Endometriosis    Malabsorption    Chronic pancreatitis ([place])    Vitamin D deficiency    Past Medical History:  Diagnosis Date   Alcoholic cirrhosis ([place])    Chronic pancreatitis ([place])  Diabetes mellitus without complication ([place])    Endometriosis    GERD (gastroesophageal reflux disease)    Intractable nausea and vomiting 06/13/2020   Liver disease    Malabsorption    MALABSORPTION SYNDROME   Osteoporosis 03/2011   t score -2.5   Pancreatitis    due to cyst and tumors due to calcifications   Seizure ([facility])    no seizure disorder,  controlled w/ meds per patient   Vitamin D deficiency 2012   VIT D 15    Family History  Problem Relation Age of Onset   Cancer Father        BONE   Breast cancer Maternal Grandmother     Past Surgical History:  Procedure Laterality Date   APPENDECTOMY     BIOPSY  01/19/2022   Procedure: BIOPSY;  Surgeon: Lavena Bullion, DO;  [facility];  Service: Gastroenterology;;   CHOLECYSTECTOMY     ESOPHAGOGASTRODUODENOSCOPY (EGD) WITH PROPOFOL N/A 01/19/2022   Procedure: ESOPHAGOGASTRODUODENOSCOPY (EGD) WITH PROPOFOL;  Surgeon: Lavena Bullion, DO;  [facility];  Service: Gastroenterology;  Laterality: N/A;   FEEDING TUBE RELOCATION  2010   JEJUNOSTOMY FEEDING TUBE  1990   MULTIPLE GASTRIC SURGERIES     MYOMECTOMY     OPEN REDUCTION INTERNAL FIXATION (ORIF) DISTAL RADIAL FRACTURE Left 06/29/2018   Procedure: OPEN REDUCTION INTERNAL FIXATION (ORIF)LEFT  DISTAL RADIAL FRACTURE;  Surgeon: Leanora Cover, MD;  Location: [facility];  Service: Orthopedics;  Laterality: Left;   ROUX-EN-Y GASTRIC BYPASS     TIBIA IM NAIL INSERTION Left 04/21/2022   Procedure: LEFT INTRAMEDULLARY (IM) NAIL TIBIAL;  Surgeon: Marybelle Killings, MD;  Location: [facility];  Service: Orthopedics;  Laterality: Left;   TIBIA IM NAIL INSERTION Left 05/05/2022   Procedure: LEFT TIBIAL NAIL DISTAL SCREW PLACEMENT;  Surgeon: Marybelle Killings, MD;  Location: [facility];  Service: Orthopedics;  Laterality: Left;   TUBAL LIGATION     Social History   Occupational History   Not on file  Tobacco Use   Smoking status: Never   Smokeless tobacco: Never  Vaping Use   Vaping Use: Never used  Substance and Sexual Activity   Alcohol use: Yes    Comment: occ ( none in several weeks)   Drug use: No   Sexual activity: Not Currently    Partners: Male    Birth control/protection: Post-menopausal

## 2022-05-17 ENCOUNTER — Other Ambulatory Visit: Payer: Self-pay

## 2022-05-17 ENCOUNTER — Ambulatory Visit: Payer: Medicare PPO | Admitting: Physical Therapy

## 2022-05-17 ENCOUNTER — Encounter: Payer: Self-pay | Admitting: Physical Therapy

## 2022-05-17 DIAGNOSIS — M25672 Stiffness of left ankle, not elsewhere classified: Secondary | ICD-10-CM | POA: Diagnosis not present

## 2022-05-17 DIAGNOSIS — M6281 Muscle weakness (generalized): Secondary | ICD-10-CM | POA: Diagnosis not present

## 2022-05-17 DIAGNOSIS — M25562 Pain in left knee: Secondary | ICD-10-CM | POA: Diagnosis not present

## 2022-05-17 DIAGNOSIS — R2681 Unsteadiness on feet: Secondary | ICD-10-CM

## 2022-05-17 DIAGNOSIS — R600 Localized edema: Secondary | ICD-10-CM

## 2022-05-17 DIAGNOSIS — R2689 Other abnormalities of gait and mobility: Secondary | ICD-10-CM

## 2022-05-17 DIAGNOSIS — M25572 Pain in left ankle and joints of left foot: Secondary | ICD-10-CM | POA: Diagnosis not present

## 2022-05-17 NOTE — Therapy (Addendum)
OUTPATIENT PHYSICAL THERAPY LOWER EXTREMITY EVALUATION   Patient Name: Carolyn [name] MRN: [id number] DOB:13-Sep-1955, 67 y.o., female Today's Date: 05/17/2022  END OF SESSION:  PT End of Session - 05/17/22 1248     Visit Number 1    Number of Visits 30    Date for PT Re-Evaluation 08/13/22    Authorization Type [employer] Medicare Choice PPO    Progress Note Due on Visit 10    PT Start Time 0840    PT Stop Time 0927    PT Time Calculation (min) 47 min             Past Medical History:  Diagnosis Date   Alcoholic cirrhosis ([place])    Chronic pancreatitis ([place])    Diabetes mellitus without complication ([place])    Endometriosis    GERD (gastroesophageal reflux disease)    Intractable nausea and vomiting 06/13/2020   Liver disease    Malabsorption    MALABSORPTION SYNDROME   Osteoporosis 03/2011   t score -2.5   Pancreatitis    due to cyst and tumors due to calcifications   Seizure ([place])    no seizure disorder, controlled w/ meds per patient   Vitamin D deficiency 2012   VIT D 15   Past Surgical History:  Procedure Laterality Date   APPENDECTOMY     BIOPSY  01/19/2022   Procedure: BIOPSY;  Surgeon: Lavena Bullion, DO;  [facility];  Service: Gastroenterology;;   CHOLECYSTECTOMY     ESOPHAGOGASTRODUODENOSCOPY (EGD) WITH PROPOFOL N/A 01/19/2022   Procedure: ESOPHAGOGASTRODUODENOSCOPY (EGD) WITH PROPOFOL;  Surgeon: Lavena Bullion, DO;  [facility];  Service: Gastroenterology;  Laterality: N/A;   FEEDING TUBE RELOCATION  2010   JEJUNOSTOMY FEEDING TUBE  1990   MULTIPLE GASTRIC SURGERIES     MYOMECTOMY     OPEN REDUCTION INTERNAL FIXATION (ORIF) DISTAL RADIAL FRACTURE Left 06/29/2018   Procedure: OPEN REDUCTION INTERNAL FIXATION (ORIF)LEFT  DISTAL RADIAL FRACTURE;  Surgeon: Leanora Cover, MD;  Location: [facility];  Service: Orthopedics;  Laterality: Left;   ROUX-EN-Y GASTRIC BYPASS     TIBIA IM NAIL INSERTION Left 04/21/2022    Procedure: LEFT INTRAMEDULLARY (IM) NAIL TIBIAL;  Surgeon: Marybelle Killings, MD;  Location: [facility];  Service: Orthopedics;  Laterality: Left;   TIBIA IM NAIL INSERTION Left 05/05/2022   Procedure: LEFT TIBIAL NAIL DISTAL SCREW PLACEMENT;  Surgeon: Marybelle Killings, MD;  Location: [facility];  Service: Orthopedics;  Laterality: Left;   TUBAL LIGATION     Patient Active Problem List   Diagnosis Date Noted   Closed fracture of left tibia and fibula 05/05/2022   Displaced comminuted fracture of shaft of left tibia, initial encounter for closed fracture 04/21/2022   Closed fracture of proximal end of left fibula, initial encounter 04/21/2022   Leukocytosis 04/21/2022   Influenza A 04/06/2022   History of gastric ulcer 03/23/2022   Abnormal loss of weight 03/23/2022   Aspiration pneumonia of both lungs ([place]) 03/23/2022   Acute on chronic diastolic CHF (congestive heart failure) ([place]) 03/23/2022   NSVT (nonsustained ventricular tachycardia) ([place]) 03/20/2022   Localized swelling of right upper extremity 03/19/2022   Cellulitis of left lower extremity 03/17/2022   Hypophosphatemia 03/17/2022   Anemia of chronic disease 03/17/2022   Sepsis ([place]) 03/14/2022   Severe sepsis ([place]) 03/13/2022   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   Abnormal CT of the abdomen 01/19/2022   Chronic gastric ulcer with perforation ([place]) [id number]   Alcoholic cirrhosis  of liver without ascites ([place]) 01/18/2022   Occult blood in stools 01/18/2022   Portal hypertensive gastropathy ([place]) 01/18/2022   Anasarca 01/12/2022   Acute respiratory failure with hypoxia ([place]) 01/11/2022   Hypoalbuminemia 01/11/2022   Pleural effusion 01/10/2022   Abdominal distention 01/10/2022   Physical deconditioning 01/09/2022   Pressure injury of skin 01/08/2022   Hypokalemia 01/07/2022   Hypomagnesemia 01/07/2022   AKI (acute kidney injury) ([place]) 01/07/2022   Severe protein-calorie malnutrition ([place]) 01/07/2022   Cellulitis of right foot 01/07/2022    Normocytic anemia 01/07/2022   Osteomyelitis ([place]) 01/06/2022   Intractable nausea and vomiting [id number]   Metabolic acidosis, increased anion gap 06/12/2020   Seizure ([place]) 05/08/2017   Chronic pain syndrome 05/08/2017   Gastroesophageal reflux disease without esophagitis 05/08/2017   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[place]) 05/08/2017   Pancreatic steatorrhea 05/08/2017   Osteoporosis 03/16/2011   Endometriosis    Malabsorption    Chronic pancreatitis ([place])    Vitamin D deficiency     PCP: General PCP   REFERRING PROVIDER:  Marybelle Killings, MD   REFERRING DIAG:  S82.252A (ICD-10-CM) - Displaced comminuted fracture of shaft of left tibia, initial encounter for closed fracture  S82.832A (ICD-10-CM) - Closed fracture of proximal end of left fibula, initial encounter    THERAPY DIAG:  Muscle weakness (generalized)  Acute pain of left knee  Pain in left ankle and joints of left foot  Stiffness of left ankle, not elsewhere classified  Other abnormalities of gait and mobility  Unsteadiness on feet  Localized edema  Rationale for Evaluation and Treatment: Rehabilitation  ONSET DATE: 04/21/2022, 1st ankle sg, 05/05/2022 2nd surgery  SUBJECTIVE:   SUBJECTIVE STATEMENT: She had prolonged hospitalization 03/13/2022-1/29/204 with severe sepsis included hypoxic respiratory failure. She was discharged to SNF.  Patient had mechanical fall transferring left leg buckled on 04/20/2022 in SNF & sustained nondisplaced fracture of fibular head & distal tibia. Her husband questions if she injured her ankle on 04/17/22 due to painful to weight bear after that day. Surgeries 04/21/2022 Left Intramedullary nail Tibia with rod but only proximal screws due to skin issues at ankle.  She returned to SNF upon discharge.    Her ankle was turning so second surgery on 05/05/2022 to place distal screws at ankle. She was discharged back to SNF.  She was discharged home from SNF on 05/14/2022.    PERTINENT  HISTORY: DM, Alcoholism in remission, chronic pancreatitis, hepatic cirrhosis, She had prolonged hospitalization 03/13/2022-1/29/204 with severe sepsis included hypoxic respiratory failure.   PAIN:  NPRS scale: today 6/10 and over last week 5/10 to 8/10 Pain location: left knee more patella & ankle more medially Pain description: ache,  Aggravating factors:  meds wearing off, over night with increased in morning Relieving factors: meds, elevating & not moving knee / ankle  PRECAUTIONS: Fall  WEIGHT BEARING RESTRICTIONS: Yes LLE TDWB   FALLS:  Has patient fallen in last 6 months? Yes. Number of falls 1  LIVING ENVIRONMENT: Lives with: lives with their spouse and 3 dogs 47, 86 & 50# Lives in: House single story  Stairs: Yes: External: 6 (long width)  steps; on right going up temporary ramp primary entrance Has following equipment at home: Gilford Rile - 2 wheeled, Wheelchair (manual), Shower bench, bed side commode, Grab bars, and Ramped entry  OCCUPATION: retired   PLOF: Independent  PATIENT GOALS:   get back to function in house & access community  Next MD visit:  05/18/2022   OBJECTIVE:  DIAGNOSTIC FINDINGS: 05/11/2022: Post distal tibial screw placement.  AP knee AP ankle appear to be in good rotational alignment.   PATIENT SURVEYS:  05/17/2022 / Evaluation:  FOTO intake:  34%  predicted:  47%  COGNITION: 05/17/2022 / Evaluation:   Overall cognitive status: WFL    SENSATION: 05/17/2022 / Evaluation:   WFL for knee area but unable to test ankle as wrapped for wound  EDEMA:  05/17/2022 / Evaluation:  left foot & ankle appear edematous but wrapped due to wound.  POSTURE:  05/17/2022 / Evaluation:  rounded shoulders, forward head, increased thoracic kyphosis, flexed trunk , and weight shift right  PALPATION: 05/17/2022 / Evaluation: not tested due to wrapped for wound  LOWER EXTREMITY ROM:   ROM Right eval Left eval  Hip flexion    Hip extension    Hip abduction    Hip  adduction    Hip internal rotation    Hip external rotation    Knee flexion    Knee extension  Seated A: LAQ -22*  Ankle dorsiflexion    Ankle plantarflexion    Ankle inversion    Ankle eversion    Ankle Not tested at evaluation due to wound with bandages.  (Blank rows = not tested)  LOWER EXTREMITY MMT:  MMT Right eval Left eval  Hip flexion    Hip extension    Hip abduction    Hip adduction    Hip internal rotation    Hip external rotation    Knee flexion  3-/5  Knee extension  3-/5  Ankle dorsiflexion    Ankle plantarflexion    Ankle inversion    Ankle eversion    Ankle Not tested at evaluation due to Toe touch Weightbearing status.  (Blank rows = not tested)   FUNCTIONAL TESTS:  05/17/2022 / Evaluation: 18 inch chair transfer:  requires armrests or RW to push up to arise.  Requires RW support for stabilization  GAIT: 05/17/2022 / Evaluation: Distance walked: 30' Assistive device utilized: Environmental consultant - 2 wheeled Level of assistance: SBA Comments: NWB LLE.  Turns with pivoting on foot incorrectly.    TODAY'S TREATMENT                                                                          DATE: Therex:    HEP instruction/performance c cues for techniques, handout provided.  Trial set performed of each for comprehension and symptom assessment.  See below for exercise list PT demo & verbal cues on TDWBing vs NWBing gait with RW.  Also not pivoting on RLE during turns due to possible skin issues on right foot. Pt verbalized understanding.   PATIENT EDUCATION:  Education details: HEP, POC Person educated: Patient Education method: Consulting civil engineer, Demonstration, Verbal cues, and Handouts Education comprehension: verbalized understanding, returned demonstration, and verbal cues required  HOME EXERCISE PROGRAM: Access Code: [email or web address] URL: [email or web address] Date: 05/17/2022 Prepared by: Jamey Reas  Exercises - seated marching  - 2 x daily - 7 x  weekly - 3 sets - 10 reps - 5 seconds hold - Seated Hip Abduction  - 2 x daily - 7 x weekly - 3 sets - 10 reps - 5 seconds hold - Seated Hamstring Stretch  -  2 x daily - 7 x weekly - 1 sets - 3 reps - 20-30 seconds hold - Seated Ankle Pumps  - 2 x daily - 7 x weekly - 3 sets - 10 reps - 5 seconds hold - Seated Ankle Circles  - 2 x daily - 7 x weekly - 3 sets - 10 reps  ASSESSMENT:  CLINICAL IMPRESSION: Patient is a 67 y.o. who comes to clinic with recent Left tibial fracture near knee & fibula near ankle with mobility, strength and movement coordination deficits that impair their ability to perform usual daily and recreational functional activities without increase difficulty/symptoms at this time. She currently has limited weight bearing on left leg impairing standing & gait.   Patient would benefit from skilled PT services to address impairments and limitations to improve to previous level of function without restriction secondary to condition.   OBJECTIVE IMPAIRMENTS: Abnormal gait, cardiopulmonary status limiting activity, decreased activity tolerance, decreased balance, decreased coordination, decreased endurance, decreased knowledge of condition, decreased knowledge of use of DME, decreased mobility, difficulty walking, decreased ROM, decreased strength, increased edema, increased muscle spasms, impaired flexibility, impaired sensation, postural dysfunction, and pain.   ACTIVITY LIMITATIONS: carrying, lifting, bending, standing, squatting, sleeping, stairs, transfers, reach over head, and locomotion level  PARTICIPATION LIMITATIONS: meal prep, cleaning, driving, and community activity  PERSONAL FACTORS: Fitness, Time since onset of injury/illness/exacerbation, and 3+ comorbidities: see PMH  are also affecting patient's functional outcome.   REHAB POTENTIAL: Good  CLINICAL DECISION MAKING: Stable/uncomplicated  EVALUATION COMPLEXITY: Low   GOALS: Goals reviewed with patient?  Yes  SHORT TERM GOALS: (target date for Short term goals are 30 days 06/16/2022)   1.  Patient will demonstrate independent use of home exercise program to maintain progress from in clinic treatments.  Goal status: New  2. Patient amb 6' with RW with TDWBing LLE (or higher if MD permits) with supervision.   LONG TERM GOALS: (target dates for all long term goals are 90 days 08/13/2022)   1. Patient will demonstrate/report pain at worst less than or equal to 2/10 to facilitate minimal limitation in daily activity secondary to pain symptoms. Goal status: New   2. Patient will demonstrate independent use of home exercise program to facilitate ability to maintain/progress functional gains from skilled physical therapy services. Goal status: New   3. Patient will demonstrate FOTO outcome > or = 47 % to indicate reduced disability due to condition.  Goal status: New   4.  Patient will demonstrate LLE MMT 5/5 throughout to faciltiate usual transfers, stairs, squatting at [facility] for daily life.   Goal status: New   5.  Patient ambulates >500' with LRAD and neg ramp, curb & stairs single rail modified independent.  Goal status: New   6.  AROM of Left knee WFL & left ankle within 75% of right ankle Goal status: New   PLAN:  PT FREQUENCY: 2-3 x/week  PT DURATION: 13 weeks (90 days)  PLANNED INTERVENTIONS: Therapeutic exercises, Therapeutic activity, Neuro Muscular re-education, Balance training, Gait training, Patient/Family education, Joint mobilization, Stair training, DME instructions, Dry Needling, Electrical stimulation, Traction, Cryotherapy, vasopneumatic deviceMoist heat, Taping, Ultrasound, Ionotophoresis '4mg'$ /ml Dexamethasone, and Manual therapy.  All included unless contraindicated  PLAN FOR NEXT SESSION: Review HEP knowledge/results. Check MD note for any updates.     Jamey Reas, PT, DPT 05/17/2022, 4:10 PM   Referring diagnosis?  [id number] (ICD-10-CM) - Displaced  comminuted fracture of shaft of left tibia, initial encounter for closed fracture  [id number] (ICD-10-CM) -  Closed fracture of proximal end of left fibula, initial encounter   Treatment diagnosis? (if different than referring diagnosis)  Muscle weakness (generalized) M62.81  Acute pain of left knee M25.562  Pain in left ankle and joints of left foot M25.572  Stiffness of left ankle, not elsewhere classified M25.672  Other abnormalities of gait and mobility R26.89  Unsteadiness on feet R26.81  Localized edema R60.0  What was this (referring dx) caused by? '[x]'$  Surgery '[x]'$  Fall '[]'$  Ongoing issue '[]'$  Arthritis '[]'$  Other: ____________  Laterality: '[]'$  Rt '[x]'$  Lt '[]'$  Both  Check all possible CPT codes:  *CHOOSE 10 OR LESS*    '[x]'$  97110 (Therapeutic Exercise)  '[]'$  92507 (SLP Treatment)  '[x]'$  97112 (Neuro Re-ed)   '[]'$  92526 (Swallowing Treatment)   '[x]'$  97116 (Gait Training)   '[]'$  [id number] (Cognitive Training, 1st 15 minutes) '[x]'$  96295 (Manual Therapy)   '[]'$  97130 (Cognitive Training, each add'l 15 minutes)  '[x]'$  97164 (Re-evaluation)                              '[]'$  Other, List CPT Code ____________  '[x]'$  [id number] (Therapeutic Activities)     '[]'$  97535 (Self Care)   '[]'$  All codes above (97110 - 97535)  '[]'$  97012 (Mechanical Traction)  '[x]'$  97014 (E-stim Unattended)  '[]'$  97032 (E-stim manual)  '[]'$  97033 (Ionto)  '[]'$  97035 (Ultrasound) '[]'$  97750 (Physical Performance Training) '[]'$  [id number] (Aquatic Therapy) '[x]'$  97016 (Vasopneumatic Device) '[]'$  [id number] (Paraffin) '[]'$  97034 (Contrast Bath) '[]'$  97597 (Wound Care 1st 20 sq cm) '[]'$  97598 (Wound Care each add'l 20 sq cm) '[]'$  97760 (Orthotic Fabrication, Fitting, Training Initial) '[]'$  [id number] (Prosthetic Management and Training Initial) '[]'$  [id number] (Orthotic or Prosthetic Training/ Modification Subsequent)

## 2022-05-18 ENCOUNTER — Encounter: Payer: Self-pay | Admitting: Orthopaedic Surgery

## 2022-05-18 ENCOUNTER — Ambulatory Visit (INDEPENDENT_AMBULATORY_CARE_PROVIDER_SITE_OTHER): Payer: Medicare PPO | Admitting: Orthopaedic Surgery

## 2022-05-18 VITALS — BP 137/83 | HR 97 | Ht 68.0 in | Wt 91.0 lb

## 2022-05-18 DIAGNOSIS — S82832A Other fracture of upper and lower end of left fibula, initial encounter for closed fracture: Secondary | ICD-10-CM

## 2022-05-18 DIAGNOSIS — S82402P Unspecified fracture of shaft of left fibula, subsequent encounter for closed fracture with malunion: Secondary | ICD-10-CM

## 2022-05-18 DIAGNOSIS — S82202P Unspecified fracture of shaft of left tibia, subsequent encounter for closed fracture with malunion: Secondary | ICD-10-CM

## 2022-05-18 DIAGNOSIS — S82252A Displaced comminuted fracture of shaft of left tibia, initial encounter for closed fracture: Secondary | ICD-10-CM

## 2022-05-18 NOTE — Progress Notes (Signed)
Office Visit Note   Patient: Carolyn [name]           Date of Birth: 02-20-1956           MRN: [id number] Visit Date: 05/18/2022              Requested by: No referring provider defined for this encounter. PCP: Pcp, No   Assessment & Plan: Visit Diagnoses:  1. Closed fracture of left tibia and fibula with malunion, subsequent encounter   2. Closed fracture of proximal end of left fibula, initial encounter   3. Displaced comminuted fracture of shaft of left tibia, initial encounter for closed fracture     Plan: Patient returns both legs look symmetrical with rotation.  The 3 staples anterior ankle placed at the single distal interlock screw are removed today.  Medial ankle leg ulcer remains unchanged from last visit less swelling no cellulitis and husband can resume dressing changes as they were doing before her fracture.  She is in physical therapy and can be weightbearing as tolerated with pain.  Discussed with her that if she is having increasing pain with weightbearing to back off the weight.  I will recheck her in 5 weeks and we will repeat AP lateral tib-fib x-rays on return.  Proximal incision entry site in her knee looks good.  Patient is happy with the results of surgery.  She is anxious to make progress with therapy.  Encouraged her to continue to increase nutrition.  Follow-Up Instructions: Return in about 5 weeks (around 06/22/2022).   Orders:  No orders of the defined types were placed in this encounter.  No orders of the defined types were placed in this encounter.     Procedures: No procedures performed   Clinical Data: No additional findings.   Subjective: Chief Complaint  Patient presents with   Left Leg - Routine Post Op    HPI  Review of Systems   Objective: Vital Signs: BP 137/83   Pulse 97   Ht '5\' 8"'$  (1.727 m)   Wt 91 lb (41.3 kg)   LMP 03/10/2009   BMI 13.84 kg/m   Physical Exam  Ortho Exam  Specialty Comments:  No specialty comments  available.  Imaging: No results found.   PMFS History: Patient Active Problem List   Diagnosis Date Noted   Closed fracture of left tibia and fibula 05/05/2022   Displaced comminuted fracture of shaft of left tibia, initial encounter for closed fracture 04/21/2022   Closed fracture of proximal end of left fibula, initial encounter 04/21/2022   Leukocytosis 04/21/2022   Influenza A 04/06/2022   History of gastric ulcer 03/23/2022   Abnormal loss of weight 03/23/2022   Aspiration pneumonia of both lungs ([place]) 03/23/2022   Acute on chronic diastolic CHF (congestive heart failure) ([place]) 03/23/2022   NSVT (nonsustained ventricular tachycardia) ([place]) 03/20/2022   Localized swelling of right upper extremity 03/19/2022   Cellulitis of left lower extremity 03/17/2022   Hypophosphatemia 03/17/2022   Anemia of chronic disease 03/17/2022   Sepsis ([place]) 03/14/2022   Severe sepsis ([place]) 03/13/2022   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   Abnormal CT of the abdomen 01/19/2022   Chronic gastric ulcer with perforation ([place]) [id number]   Alcoholic cirrhosis of liver without ascites ([place]) 01/18/2022   Occult blood in stools 01/18/2022   Portal hypertensive gastropathy ([place]) 01/18/2022   Anasarca 01/12/2022   Acute respiratory failure with hypoxia ([place]) 01/11/2022   Hypoalbuminemia 01/11/2022   Pleural effusion 01/10/2022  Abdominal distention 01/10/2022   Physical deconditioning 01/09/2022   Pressure injury of skin 01/08/2022   Hypokalemia 01/07/2022   Hypomagnesemia 01/07/2022   AKI (acute kidney injury) ([place]) 01/07/2022   Severe protein-calorie malnutrition ([place]) 01/07/2022   Cellulitis of right foot 01/07/2022   Normocytic anemia 01/07/2022   Osteomyelitis ([place]) 01/06/2022   Intractable nausea and vomiting [id number]   Metabolic acidosis, increased anion gap 06/12/2020   Seizure ([place]) 05/08/2017   Chronic pain syndrome 05/08/2017   Gastroesophageal reflux disease without esophagitis  05/08/2017   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[place]) 05/08/2017   Pancreatic steatorrhea 05/08/2017   Osteoporosis 03/16/2011   Endometriosis    Malabsorption    Chronic pancreatitis ([place])    Vitamin D deficiency    Past Medical History:  Diagnosis Date   Alcoholic cirrhosis ([place])    Chronic pancreatitis ([place])    Diabetes mellitus without complication ([place])    Endometriosis    GERD (gastroesophageal reflux disease)    Intractable nausea and vomiting 06/13/2020   Liver disease    Malabsorption    MALABSORPTION SYNDROME   Osteoporosis 03/2011   t score -2.5   Pancreatitis    due to cyst and tumors due to calcifications   Seizure ([facility])    no seizure disorder, controlled w/ meds per patient   Vitamin D deficiency 2012   VIT D 15    Family History  Problem Relation Age of Onset   Cancer Father        BONE   Breast cancer Maternal Grandmother     Past Surgical History:  Procedure Laterality Date   APPENDECTOMY     BIOPSY  01/19/2022   Procedure: BIOPSY;  Surgeon: Lavena Bullion, DO;  [facility];  Service: Gastroenterology;;   CHOLECYSTECTOMY     ESOPHAGOGASTRODUODENOSCOPY (EGD) WITH PROPOFOL N/A 01/19/2022   Procedure: ESOPHAGOGASTRODUODENOSCOPY (EGD) WITH PROPOFOL;  Surgeon: Lavena Bullion, DO;  [facility];  Service: Gastroenterology;  Laterality: N/A;   FEEDING TUBE RELOCATION  2010   JEJUNOSTOMY FEEDING TUBE  1990   MULTIPLE GASTRIC SURGERIES     MYOMECTOMY     OPEN REDUCTION INTERNAL FIXATION (ORIF) DISTAL RADIAL FRACTURE Left 06/29/2018   Procedure: OPEN REDUCTION INTERNAL FIXATION (ORIF)LEFT  DISTAL RADIAL FRACTURE;  Surgeon: Leanora Cover, MD;  Location: [place];  Service: Orthopedics;  Laterality: Left;   ROUX-EN-Y GASTRIC BYPASS     TIBIA IM NAIL INSERTION Left 04/21/2022   Procedure: LEFT INTRAMEDULLARY (IM) NAIL TIBIAL;  Surgeon: Marybelle Killings, MD;  Location: [facility];  Service: Orthopedics;  Laterality: Left;   TIBIA IM  NAIL INSERTION Left 05/05/2022   Procedure: LEFT TIBIAL NAIL DISTAL SCREW PLACEMENT;  Surgeon: Marybelle Killings, MD;  Location: [facility];  Service: Orthopedics;  Laterality: Left;   TUBAL LIGATION     Social History   Occupational History   Not on file  Tobacco Use   Smoking status: Never   Smokeless tobacco: Never  Vaping Use   Vaping Use: Never used  Substance and Sexual Activity   Alcohol use: Yes    Comment: occ ( none in several weeks)   Drug use: No   Sexual activity: Not Currently    Partners: Male    Birth control/protection: Post-menopausal

## 2022-05-19 ENCOUNTER — Ambulatory Visit (INDEPENDENT_AMBULATORY_CARE_PROVIDER_SITE_OTHER): Payer: Medicare PPO | Admitting: Physical Therapy

## 2022-05-19 ENCOUNTER — Encounter: Payer: Self-pay | Admitting: Physical Therapy

## 2022-05-19 DIAGNOSIS — M25572 Pain in left ankle and joints of left foot: Secondary | ICD-10-CM

## 2022-05-19 DIAGNOSIS — M6281 Muscle weakness (generalized): Secondary | ICD-10-CM | POA: Diagnosis not present

## 2022-05-19 DIAGNOSIS — M25672 Stiffness of left ankle, not elsewhere classified: Secondary | ICD-10-CM | POA: Diagnosis not present

## 2022-05-19 DIAGNOSIS — R2681 Unsteadiness on feet: Secondary | ICD-10-CM

## 2022-05-19 DIAGNOSIS — R600 Localized edema: Secondary | ICD-10-CM

## 2022-05-19 DIAGNOSIS — R2689 Other abnormalities of gait and mobility: Secondary | ICD-10-CM

## 2022-05-19 DIAGNOSIS — M25562 Pain in left knee: Secondary | ICD-10-CM

## 2022-05-19 NOTE — Therapy (Signed)
OUTPATIENT PHYSICAL THERAPY LOWER EXTREMITY TREATMENT   Patient Name: Carolyn [name] MRN: [id number] DOB:[birth year], 66 y.o., female Today's Date: 05/19/2022  END OF SESSION:  PT End of Session - 05/19/22 1017     Visit Number 2    Number of Visits 30    Date for PT Re-Evaluation 08/13/22    Authorization Type [employer] Choice PPO    Progress Note Due on Visit 10    PT Start Time 1013    PT Stop Time 1053    PT Time Calculation (min) 40 min    Equipment Utilized During Treatment Gait belt    Activity Tolerance Patient tolerated treatment well    Behavior During Therapy WFL for tasks assessed/performed              Past Medical History:  Diagnosis Date   Alcoholic cirrhosis ([place])    Chronic pancreatitis ([place])    Diabetes mellitus without complication ([place])    Endometriosis    GERD (gastroesophageal reflux disease)    Intractable nausea and vomiting 06/13/2020   Liver disease    Malabsorption    MALABSORPTION SYNDROME   Osteoporosis 03/2011   t score -2.5   Pancreatitis    due to cyst and tumors due to calcifications   Seizure ([place])    no seizure disorder, controlled w/ meds per patient   Vitamin D deficiency 2012   VIT D 15   Past Surgical History:  Procedure Laterality Date   APPENDECTOMY     BIOPSY  01/19/2022   Procedure: BIOPSY;  Surgeon: Lavena Bullion, DO;  Location: [facility];  Service: Gastroenterology;;   CHOLECYSTECTOMY     ESOPHAGOGASTRODUODENOSCOPY (EGD) WITH PROPOFOL N/A 01/19/2022   Procedure: ESOPHAGOGASTRODUODENOSCOPY (EGD) WITH PROPOFOL;  Surgeon: Lavena Bullion, DO;  Location: [facility];  Service: Gastroenterology;  Laterality: N/A;   FEEDING TUBE RELOCATION  2010   JEJUNOSTOMY FEEDING TUBE  1990   MULTIPLE GASTRIC SURGERIES     MYOMECTOMY     OPEN REDUCTION INTERNAL FIXATION (ORIF) DISTAL RADIAL FRACTURE Left 06/29/2018   Procedure: OPEN REDUCTION INTERNAL FIXATION (ORIF)LEFT  DISTAL RADIAL FRACTURE;  Surgeon: Leanora Cover, MD;  Location: [facility];  Service: Orthopedics;  Laterality: Left;   ROUX-EN-Y GASTRIC BYPASS     TIBIA IM NAIL INSERTION Left 04/21/2022   Procedure: LEFT INTRAMEDULLARY (IM) NAIL TIBIAL;  Surgeon: Marybelle Killings, MD;  Location: [facility];  Service: Orthopedics;  Laterality: Left;   TIBIA IM NAIL INSERTION Left 05/05/2022   Procedure: LEFT TIBIAL NAIL DISTAL SCREW PLACEMENT;  Surgeon: Marybelle Killings, MD;  Location: [facility];  Service: Orthopedics;  Laterality: Left;   TUBAL LIGATION     Patient Active Problem List   Diagnosis Date Noted   Closed fracture of left tibia and fibula 05/05/2022   Displaced comminuted fracture of shaft of left tibia, initial encounter for closed fracture 04/21/2022   Closed fracture of proximal end of left fibula, initial encounter 04/21/2022   Leukocytosis 04/21/2022   Influenza A 04/06/2022   History of gastric ulcer 03/23/2022   Abnormal loss of weight 03/23/2022   Aspiration pneumonia of both lungs ([place]) 03/23/2022   Acute on chronic diastolic CHF (congestive heart failure) ([place]) 03/23/2022   NSVT (nonsustained ventricular tachycardia) ([place]) 03/20/2022   Localized swelling of right upper extremity 03/19/2022   Cellulitis of left lower extremity 03/17/2022   Hypophosphatemia 03/17/2022   Anemia of chronic disease 03/17/2022   Sepsis ([place]) 03/14/2022   Severe  sepsis ([place]) 03/13/2022   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   Abnormal CT of the abdomen 01/19/2022   Chronic gastric ulcer with perforation ([place]) [id number]   Alcoholic cirrhosis of liver without ascites ([place]) 01/18/2022   Occult blood in stools 01/18/2022   Portal hypertensive gastropathy ([place]) 01/18/2022   Anasarca 01/12/2022   Acute respiratory failure with hypoxia ([place]) 01/11/2022   Hypoalbuminemia 01/11/2022   Pleural effusion 01/10/2022   Abdominal distention 01/10/2022   Physical deconditioning 01/09/2022   Pressure injury of skin 01/08/2022   Hypokalemia 01/07/2022    Hypomagnesemia 01/07/2022   AKI (acute kidney injury) ([place]) 01/07/2022   Severe protein-calorie malnutrition ([place]) 01/07/2022   Cellulitis of right foot 01/07/2022   Normocytic anemia 01/07/2022   Osteomyelitis ([place]) 01/06/2022   Intractable nausea and vomiting [id number]   Metabolic acidosis, increased anion gap 06/12/2020   Seizure ([place]) 05/08/2017   Chronic pain syndrome 05/08/2017   Gastroesophageal reflux disease without esophagitis 05/08/2017   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[place]) 05/08/2017   Pancreatic steatorrhea 05/08/2017   Osteoporosis 03/16/2011   Endometriosis    Malabsorption    Chronic pancreatitis ([place])    Vitamin D deficiency     PCP: General PCP   REFERRING PROVIDER:  Marybelle Killings, MD   REFERRING DIAG:  S82.252A (ICD-10-CM) - Displaced comminuted fracture of shaft of left tibia, initial encounter for closed fracture  S82.832A (ICD-10-CM) - Closed fracture of proximal end of left fibula, initial encounter    THERAPY DIAG:  Muscle weakness (generalized)  Acute pain of left knee  Pain in left ankle and joints of left foot  Stiffness of left ankle, not elsewhere classified  Other abnormalities of gait and mobility  Localized edema  Unsteadiness on feet  Rationale for Evaluation and Treatment: Rehabilitation  ONSET DATE: 04/21/2022, 1st ankle sg, 05/05/2022 2nd surgery  SUBJECTIVE:   SUBJECTIVE STATEMENT: She saw Dr. Ninfa Linden who updated weight bearing to WBAT and back off if pain.  Her exercises have been going well and her hamstrings seem to have stretched out some.   PERTINENT HISTORY: DM, Alcoholism in remission, chronic pancreatitis, hepatic cirrhosis, She had prolonged hospitalization 03/13/2022-1/29/204 with severe sepsis included hypoxic respiratory failure.   PAIN:  NPRS scale: today 6/10 and over last week 5/10 to 8/10 Pain location: left knee more patella & ankle more medially Pain description: ache,  Aggravating factors:  meds wearing  off, over night with increased in morning Relieving factors: meds, elevating & not moving knee / ankle  PRECAUTIONS: Fall  WEIGHT BEARING RESTRICTIONS: Yes LLE WBAT as of 05/18/2022  FALLS:  Has patient fallen in last 6 months? Yes. Number of falls 1  LIVING ENVIRONMENT: Lives with: lives with their spouse and 3 dogs 16, 35 & 50# Lives in: House single story  Stairs: Yes: External: 6 (long width)  steps; on right going up temporary ramp primary entrance Has following equipment at home: Gilford Rile - 2 wheeled, Wheelchair (manual), Shower bench, bed side commode, Grab bars, and Ramped entry  OCCUPATION: retired   PLOF: Independent  PATIENT GOALS:   get back to function in house & access community  Next MD visit:  05/18/2022  OBJECTIVE:   DIAGNOSTIC FINDINGS: 05/11/2022: Post distal tibial screw placement.  AP knee AP ankle appear to be in good rotational alignment.   PATIENT SURVEYS:  05/17/2022 / Evaluation:  FOTO intake:  34%  predicted:  47%  COGNITION: 05/17/2022 / Evaluation:   Overall cognitive status: WFL    SENSATION: 05/17/2022 / Evaluation:  WFL for knee area but unable to test ankle as wrapped for wound  EDEMA:  05/17/2022 / Evaluation:  left foot & ankle appear edematous but wrapped due to wound.  POSTURE:  05/17/2022 / Evaluation:  rounded shoulders, forward head, increased thoracic kyphosis, flexed trunk , and weight shift right  PALPATION: 05/17/2022 / Evaluation: not tested due to wrapped for wound  LOWER EXTREMITY ROM:   ROM Right eval Left eval  Hip flexion    Hip extension    Hip abduction    Hip adduction    Hip internal rotation    Hip external rotation    Knee flexion    Knee extension  Seated A: LAQ -22*  Ankle dorsiflexion    Ankle plantarflexion    Ankle inversion    Ankle eversion    Ankle Not tested at evaluation due to wound with bandages.  (Blank rows = not tested)  LOWER EXTREMITY MMT:  MMT Right eval Left eval  Hip flexion    Hip  extension    Hip abduction    Hip adduction    Hip internal rotation    Hip external rotation    Knee flexion  3-/5  Knee extension  3-/5  Ankle dorsiflexion    Ankle plantarflexion    Ankle inversion    Ankle eversion    Ankle Not tested at evaluation due to Toe touch Weightbearing status.  (Blank rows = not tested)   FUNCTIONAL TESTS:  05/17/2022 / Evaluation: 18 inch chair transfer:  requires armrests or RW to push up to arise.  Requires RW support for stabilization  GAIT: 05/17/2022 / Evaluation: Distance walked: 30' Assistive device utilized: Environmental consultant - 2 wheeled Level of assistance: SBA Comments: NWB LLE.  Turns with pivoting on foot incorrectly.    TODAY'S TREATMENT                                                                          DATE: 05/19/2022: Therapeutic Exercise: NuStep seat 10 with BLEs & BUEs level 4 for 8 min Seated open chain left ankle DF/PF 10 reps & circles CW/CCW 10 reps; alphabet 1 rep  Manual Therapy Gentle PROM left ankle ankle DF, inversion & eversion  Gait Training: PT demo & verbal cues on WBAT with weight shift over LLE in stance. Pt amb with CAM boot LLE WBAT with RW 50' initially CGA then SBA. Pt amb 87' with RW WBAT with SBA.  PT instructed in starting with short distance gait in home with CAM boot only using pain as guidance for rest or taking more weight on UEs. Pt verbalized understanding.    05/17/2022:  Therex:   HEP instruction/performance c cues for techniques, handout provided.  Trial set performed of each for comprehension and symptom assessment.  See below for exercise list PT demo & verbal cues on TDWBing vs NWBing gait with RW.  Also not pivoting on RLE during turns due to possible skin issues on right foot. Pt verbalized understanding.   PATIENT EDUCATION:  Education details: HEP, POC Person educated: Patient Education method: Consulting civil engineer, Demonstration, Verbal cues, and Handouts Education comprehension: verbalized  understanding, returned demonstration, and verbal cues required  HOME EXERCISE PROGRAM: Access Code: [email or web address] URL: [email or web address]  Date: 05/17/2022 Prepared by: Jamey Reas  Exercises - seated marching  - 2 x daily - 7 x weekly - 3 sets - 10 reps - 5 seconds hold - Seated Hip Abduction  - 2 x daily - 7 x weekly - 3 sets - 10 reps - 5 seconds hold - Seated Hamstring Stretch  - 2 x daily - 7 x weekly - 1 sets - 3 reps - 20-30 seconds hold - Seated Ankle Pumps  - 2 x daily - 7 x weekly - 3 sets - 10 reps - 5 seconds hold - Seated Ankle Circles  - 2 x daily - 7 x weekly - 3 sets - 10 reps  ASSESSMENT:  CLINICAL IMPRESSION: Patient tolerated WBAT with CAM boot with RW support with no increase in pain.  She appears safe to start this gait for short distances like within her home.  Patient continues to benefit from skilled PT.  OBJECTIVE IMPAIRMENTS: Abnormal gait, cardiopulmonary status limiting activity, decreased activity tolerance, decreased balance, decreased coordination, decreased endurance, decreased knowledge of condition, decreased knowledge of use of DME, decreased mobility, difficulty walking, decreased ROM, decreased strength, increased edema, increased muscle spasms, impaired flexibility, impaired sensation, postural dysfunction, and pain.   ACTIVITY LIMITATIONS: carrying, lifting, bending, standing, squatting, sleeping, stairs, transfers, reach over head, and locomotion level  PARTICIPATION LIMITATIONS: meal prep, cleaning, driving, and community activity  PERSONAL FACTORS: Fitness, Time since onset of injury/illness/exacerbation, and 3+ comorbidities: see PMH  are also affecting patient's functional outcome.   REHAB POTENTIAL: Good  CLINICAL DECISION MAKING: Stable/uncomplicated  EVALUATION COMPLEXITY: Low   GOALS: Goals reviewed with patient? Yes  SHORT TERM GOALS: (target date for Short term goals are 30 days 06/16/2022)   1.  Patient will  demonstrate independent use of home exercise program to maintain progress from in clinic treatments.  Goal status: New  2. Patient amb 20' with RW with TDWBing LLE (or higher if MD permits) with supervision.   LONG TERM GOALS: (target dates for all long term goals are 90 days 08/13/2022)   1. Patient will demonstrate/report pain at worst less than or equal to 2/10 to facilitate minimal limitation in daily activity secondary to pain symptoms. Goal status: New   2. Patient will demonstrate independent use of home exercise program to facilitate ability to maintain/progress functional gains from skilled physical therapy services. Goal status: New   3. Patient will demonstrate FOTO outcome > or = 47 % to indicate reduced disability due to condition.  Goal status: New   4.  Patient will demonstrate LLE MMT 5/5 throughout to faciltiate usual transfers, stairs, squatting at [facility] for daily life.   Goal status: New   5.  Patient ambulates >500' with LRAD and neg ramp, curb & stairs single rail modified independent.  Goal status: New   6.  AROM of Left knee WFL & left ankle within 75% of right ankle Goal status: New   PLAN:  PT FREQUENCY: 2-3 x/week  PT DURATION: 13 weeks (90 days)  PLANNED INTERVENTIONS: Therapeutic exercises, Therapeutic activity, Neuro Muscular re-education, Balance training, Gait training, Patient/Family education, Joint mobilization, Stair training, DME instructions, Dry Needling, Electrical stimulation, Traction, Cryotherapy, vasopneumatic deviceMoist heat, Taping, Ultrasound, Ionotophoresis '4mg'$ /ml Dexamethasone, and Manual therapy.  All included unless contraindicated  PLAN FOR NEXT SESSION: check how gait WBAT in home has gone,  measure for Vivewear compression sock, measure ankle ROM. Cont with there exer & therapeutic activities to tolerance,   Jamey Reas, PT, DPT 05/19/2022, 11:12  AM

## 2022-05-24 ENCOUNTER — Ambulatory Visit (INDEPENDENT_AMBULATORY_CARE_PROVIDER_SITE_OTHER): Payer: Medicare PPO | Admitting: Physical Therapy

## 2022-05-24 ENCOUNTER — Encounter: Payer: Self-pay | Admitting: Physical Therapy

## 2022-05-24 DIAGNOSIS — R600 Localized edema: Secondary | ICD-10-CM

## 2022-05-24 DIAGNOSIS — M25562 Pain in left knee: Secondary | ICD-10-CM

## 2022-05-24 DIAGNOSIS — R2681 Unsteadiness on feet: Secondary | ICD-10-CM

## 2022-05-24 DIAGNOSIS — M6281 Muscle weakness (generalized): Secondary | ICD-10-CM

## 2022-05-24 DIAGNOSIS — M25572 Pain in left ankle and joints of left foot: Secondary | ICD-10-CM

## 2022-05-24 DIAGNOSIS — M25672 Stiffness of left ankle, not elsewhere classified: Secondary | ICD-10-CM

## 2022-05-24 DIAGNOSIS — R2689 Other abnormalities of gait and mobility: Secondary | ICD-10-CM

## 2022-05-24 NOTE — Therapy (Addendum)
OUTPATIENT PHYSICAL THERAPY LOWER EXTREMITY TREATMENT   Patient Name: Carolyn [name] MRN: [id number] DOB:Dec 24, 1955, 67 y.o., female Today's Date: 05/24/2022  END OF SESSION:  PT End of Session - 05/24/22 1018     Visit Number 3    Number of Visits 30    Date for PT Re-Evaluation 08/13/22    Authorization Type [employer] PPO    Authorization Time Period $20 COPAY Approved  Authorization [id number]  Tracking [id number] PT VISITS 3/4-5/31/2024    Authorization - Visit Number 3    Authorization - Number of Visits 12    Progress Note Due on Visit 10    PT Start Time [id number]    PT Stop Time 1058    PT Time Calculation (min) 43 min    Equipment Utilized During Treatment Gait belt    Activity Tolerance Patient tolerated treatment well    Behavior During Therapy WFL for tasks assessed/performed              Past Medical History:  Diagnosis Date   Alcoholic cirrhosis ([place])    Chronic pancreatitis ([place])    Diabetes mellitus without complication ([place])    Endometriosis    GERD (gastroesophageal reflux disease)    Intractable nausea and vomiting 06/13/2020   Liver disease    Malabsorption    MALABSORPTION SYNDROME   Osteoporosis 03/2011   t score -2.5   Pancreatitis    due to cyst and tumors due to calcifications   Seizure ([place])    no seizure disorder, controlled w/ meds per patient   Vitamin D deficiency 2012   VIT D 15   Past Surgical History:  Procedure Laterality Date   APPENDECTOMY     BIOPSY  01/19/2022   Procedure: BIOPSY;  Surgeon: Lavena Bullion, DO;  Location: [facility];  Service: Gastroenterology;;   CHOLECYSTECTOMY     ESOPHAGOGASTRODUODENOSCOPY (EGD) WITH PROPOFOL N/A 01/19/2022   Procedure: ESOPHAGOGASTRODUODENOSCOPY (EGD) WITH PROPOFOL;  Surgeon: Lavena Bullion, DO;  Location: [facility];  Service: Gastroenterology;  Laterality: N/A;   FEEDING TUBE RELOCATION  2010   JEJUNOSTOMY FEEDING TUBE  1990   MULTIPLE GASTRIC SURGERIES      MYOMECTOMY     OPEN REDUCTION INTERNAL FIXATION (ORIF) DISTAL RADIAL FRACTURE Left 06/29/2018   Procedure: OPEN REDUCTION INTERNAL FIXATION (ORIF)LEFT  DISTAL RADIAL FRACTURE;  Surgeon: Leanora Cover, MD;  Location: [facility];  Service: Orthopedics;  Laterality: Left;   ROUX-EN-Y GASTRIC BYPASS     TIBIA IM NAIL INSERTION Left 04/21/2022   Procedure: LEFT INTRAMEDULLARY (IM) NAIL TIBIAL;  Surgeon: Marybelle Killings, MD;  Location: [facility];  Service: Orthopedics;  Laterality: Left;   TIBIA IM NAIL INSERTION Left 05/05/2022   Procedure: LEFT TIBIAL NAIL DISTAL SCREW PLACEMENT;  Surgeon: Marybelle Killings, MD;  Location: [facility];  Service: Orthopedics;  Laterality: Left;   TUBAL LIGATION     Patient Active Problem List   Diagnosis Date Noted   Closed fracture of left tibia and fibula 05/05/2022   Displaced comminuted fracture of shaft of left tibia, initial encounter for closed fracture 04/21/2022   Closed fracture of proximal end of left fibula, initial encounter 04/21/2022   Leukocytosis 04/21/2022   Influenza A 04/06/2022   History of gastric ulcer 03/23/2022   Abnormal loss of weight 03/23/2022   Aspiration pneumonia of both lungs ([place]) 03/23/2022   Acute on chronic diastolic CHF (congestive heart failure) ([place]) 03/23/2022   NSVT (nonsustained ventricular tachycardia) ([place]) 03/20/2022  Localized swelling of right upper extremity 03/19/2022   Cellulitis of left lower extremity 03/17/2022   Hypophosphatemia 03/17/2022   Anemia of chronic disease 03/17/2022   Sepsis ([place]) 03/14/2022   Severe sepsis ([place]) 03/13/2022   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   Abnormal CT of the abdomen 01/19/2022   Chronic gastric ulcer with perforation ([place]) [id number]   Alcoholic cirrhosis of liver without ascites ([place]) 01/18/2022   Occult blood in stools 01/18/2022   Portal hypertensive gastropathy ([place]) 01/18/2022   Anasarca 01/12/2022   Acute respiratory failure with hypoxia ([place]) 01/11/2022    Hypoalbuminemia 01/11/2022   Pleural effusion 01/10/2022   Abdominal distention 01/10/2022   Physical deconditioning 01/09/2022   Pressure injury of skin 01/08/2022   Hypokalemia 01/07/2022   Hypomagnesemia 01/07/2022   AKI (acute kidney injury) ([place]) 01/07/2022   Severe protein-calorie malnutrition ([place]) 01/07/2022   Cellulitis of right foot 01/07/2022   Normocytic anemia 01/07/2022   Osteomyelitis ([place]) 01/06/2022   Intractable nausea and vomiting [id number]   Metabolic acidosis, increased anion gap 06/12/2020   Seizure ([place]) 05/08/2017   Chronic pain syndrome 05/08/2017   Gastroesophageal reflux disease without esophagitis 05/08/2017   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[place]) 05/08/2017   Pancreatic steatorrhea 05/08/2017   Osteoporosis 03/16/2011   Endometriosis    Malabsorption    Chronic pancreatitis ([place])    Vitamin D deficiency     PCP: General PCP   REFERRING PROVIDER:  Marybelle Killings, MD   REFERRING DIAG:  S82.252A (ICD-10-CM) - Displaced comminuted fracture of shaft of left tibia, initial encounter for closed fracture  S82.832A (ICD-10-CM) - Closed fracture of proximal end of left fibula, initial encounter    THERAPY DIAG:  Muscle weakness (generalized)  Acute pain of left knee  Pain in left ankle and joints of left foot  Stiffness of left ankle, not elsewhere classified  Other abnormalities of gait and mobility  Localized edema  Unsteadiness on feet  Rationale for Evaluation and Treatment: Rehabilitation  ONSET DATE: 04/21/2022, 1st ankle sg, 05/05/2022 2nd surgery  SUBJECTIVE:   SUBJECTIVE STATEMENT: Walking with boot with WBAT with RW without issues.  She is having some discomfort with sleeping.   PERTINENT HISTORY: DM, Alcoholism in remission, chronic pancreatitis, hepatic cirrhosis, She had prolonged hospitalization 03/13/2022-1/29/204 with severe sepsis included hypoxic respiratory failure.   PAIN:  NPRS scale: today knee 6/10 & ankle 5/10 and  since last PT  3/10 to 7/10 Pain location: left knee more patella & ankle more medially Pain description: ache,  Aggravating factors:  meds wearing off, over night with increased in morning Relieving factors: meds, elevating & not moving knee / ankle  PRECAUTIONS: Fall  WEIGHT BEARING RESTRICTIONS: Yes LLE WBAT as of 05/18/2022  FALLS:  Has patient fallen in last 6 months? Yes. Number of falls 1  LIVING ENVIRONMENT: Lives with: lives with their spouse and 3 dogs 89, 57 & 50# Lives in: House single story  Stairs: Yes: External: 6 (long width)  steps; on right going up temporary ramp primary entrance Has following equipment at home: Gilford Rile - 2 wheeled, Wheelchair (manual), Shower bench, bed side commode, Grab bars, and Ramped entry  OCCUPATION: retired   PLOF: Independent  PATIENT GOALS:   get back to function in house & access community  Next MD visit:  05/18/2022  OBJECTIVE:   DIAGNOSTIC FINDINGS: 05/11/2022: Post distal tibial screw placement.  AP knee AP ankle appear to be in good rotational alignment.   PATIENT SURVEYS:  05/17/2022 / Evaluation:  FOTO intake:  34%  predicted:  47%  COGNITION: 05/17/2022 / Evaluation:   Overall cognitive status: WFL    SENSATION: 05/17/2022 / Evaluation:   WFL for knee area but unable to test ankle as wrapped for wound  EDEMA:  05/17/2022 / Evaluation:  left foot & ankle appear edematous but wrapped due to wound.  POSTURE:  05/17/2022 / Evaluation:  rounded shoulders, forward head, increased thoracic kyphosis, flexed trunk , and weight shift right  PALPATION: 05/17/2022 / Evaluation: not tested due to wrapped for wound  LOWER EXTREMITY ROM:   ROM Right eval Left eval  Hip flexion    Hip extension    Hip abduction    Hip adduction    Hip internal rotation    Hip external rotation    Knee flexion    Knee extension  Seated A: LAQ -22*  Ankle dorsiflexion    Ankle plantarflexion    Ankle inversion    Ankle eversion    Ankle Not tested  at evaluation due to wound with bandages.  (Blank rows = not tested)  LOWER EXTREMITY MMT:  MMT Right eval Left eval  Hip flexion    Hip extension    Hip abduction    Hip adduction    Hip internal rotation    Hip external rotation    Knee flexion  3-/5  Knee extension  3-/5  Ankle dorsiflexion    Ankle plantarflexion    Ankle inversion    Ankle eversion    Ankle Not tested at evaluation due to Toe touch Weightbearing status.  (Blank rows = not tested)   FUNCTIONAL TESTS:  05/17/2022 / Evaluation: 18 inch chair transfer:  requires armrests or RW to push up to arise.  Requires RW support for stabilization  GAIT: 05/17/2022 / Evaluation: Distance walked: 30' Assistive device utilized: Environmental consultant - 2 wheeled Level of assistance: SBA Comments: NWB LLE.  Turns with pivoting on foot incorrectly.    TODAY'S TREATMENT                                                                          DATE: 05/24/2022: Therapeutic Exercise: NuStep seat 10 with BLEs & BUEs level 5 for 5.5 min & BLEs only level 3 for 2.5 min  Hamstring stretch LLE long sit with strap DF to neutral 30 sec hold 3 reps Seated forefoot adduction & abduction 10 reps Picking up marbles with toes - 10 reps directly in front for DF, 10 reps laterally to facilitate some eversion & 10 reps medially to facilitate some inversion.  Neuromuscular Re-ed Standing with 1/2" under RLE to offset height of CAM boot: UE resistance to facilitate UE strength, core stabilization, WBAT BLEs and balance. Single UEs & BUEs rows, forward reach & upward reach 10 reps ea.  MinA for balance. Pt reports no increase in pain.  Manual Therapy Gentle PROM left ankle ankle DF, inversion & eversion  Gait Training: Pt amb 80'  X 2 with RW WBAT with SBA with CAM boot.    05/19/2022: Therapeutic Exercise: NuStep seat 10 with BLEs & BUEs level 4 for 8 min Seated open chain left ankle DF/PF 10 reps & circles CW/CCW 10 reps; alphabet 1 rep   Manual  Therapy  Gentle PROM left ankle ankle DF, inversion & eversion   Gait Training: PT demo & verbal cues on WBAT with weight shift over LLE in stance. Pt amb with CAM boot LLE WBAT with RW 50' initially CGA then SBA. Pt amb 86' with RW WBAT with SBA.  PT instructed in starting with short distance gait in home with CAM boot only using pain as guidance for rest or taking more weight on UEs. Pt verbalized understanding.    Self-care: PT recommending checking stocknette for dampness. Pt verbalized understanding.  She measures for medium size Vivewear sock.    05/17/2022:  Therex:   HEP instruction/performance c cues for techniques, handout provided.  Trial set performed of each for comprehension and symptom assessment.  See below for exercise list PT demo & verbal cues on TDWBing vs NWBing gait with RW.  Also not pivoting on RLE during turns due to possible skin issues on right foot. Pt verbalized understanding.   PATIENT EDUCATION:  Education details: HEP, POC Person educated: Patient Education method: Consulting civil engineer, Demonstration, Verbal cues, and Handouts Education comprehension: verbalized understanding, returned demonstration, and verbal cues required  HOME EXERCISE PROGRAM: Access Code: [email or web address] URL: [email or web address] Date: 05/17/2022 Prepared by: Jamey Reas  Exercises - seated marching  - 2 x daily - 7 x weekly - 3 sets - 10 reps - 5 seconds hold - Seated Hip Abduction  - 2 x daily - 7 x weekly - 3 sets - 10 reps - 5 seconds hold - Seated Hamstring Stretch  - 2 x daily - 7 x weekly - 1 sets - 3 reps - 20-30 seconds hold - Seated Ankle Pumps  - 2 x daily - 7 x weekly - 3 sets - 10 reps - 5 seconds hold - Seated Ankle Circles  - 2 x daily - 7 x weekly - 3 sets - 10 reps  ASSESSMENT:  CLINICAL IMPRESSION: Patient tolerating WBAT without issues.  PT progressed active range ankle exercises and standing closed chain activities without issues.   Patient continues to  benefit from skilled PT.  OBJECTIVE IMPAIRMENTS: Abnormal gait, cardiopulmonary status limiting activity, decreased activity tolerance, decreased balance, decreased coordination, decreased endurance, decreased knowledge of condition, decreased knowledge of use of DME, decreased mobility, difficulty walking, decreased ROM, decreased strength, increased edema, increased muscle spasms, impaired flexibility, impaired sensation, postural dysfunction, and pain.   ACTIVITY LIMITATIONS: carrying, lifting, bending, standing, squatting, sleeping, stairs, transfers, reach over head, and locomotion level  PARTICIPATION LIMITATIONS: meal prep, cleaning, driving, and community activity  PERSONAL FACTORS: Fitness, Time since onset of injury/illness/exacerbation, and 3+ comorbidities: see PMH  are also affecting patient's functional outcome.   REHAB POTENTIAL: Good  CLINICAL DECISION MAKING: Stable/uncomplicated  EVALUATION COMPLEXITY: Low   GOALS: Goals reviewed with patient? Yes  SHORT TERM GOALS: (target date for Short term goals are 30 days 06/16/2022)   1.  Patient will demonstrate independent use of home exercise program to maintain progress from in clinic treatments.  Goal status: New  2. Patient amb 71' with RW with TDWBing LLE (or higher if MD permits) with supervision.   LONG TERM GOALS: (target dates for all long term goals are 90 days 08/13/2022)   1. Patient will demonstrate/report pain at worst less than or equal to 2/10 to facilitate minimal limitation in daily activity secondary to pain symptoms. Goal status: New   2. Patient will demonstrate independent use of home exercise program to facilitate ability to maintain/progress functional gains from skilled physical therapy services. Goal  status: New   3. Patient will demonstrate FOTO outcome > or = 47 % to indicate reduced disability due to condition.  Goal status: New   4.  Patient will demonstrate LLE MMT 5/5 throughout to  faciltiate usual transfers, stairs, squatting at [facility] for daily life.   Goal status: New   5.  Patient ambulates >500' with LRAD and neg ramp, curb & stairs single rail modified independent.  Goal status: New   6.  AROM of Left knee WFL & left ankle within 75% of right ankle Goal status: New   PLAN:  PT FREQUENCY: 2-3 x/week  PT DURATION: 13 weeks (90 days)  PLANNED INTERVENTIONS: Therapeutic exercises, Therapeutic activity, Neuro Muscular re-education, Balance training, Gait training, Patient/Family education, Joint mobilization, Stair training, DME instructions, Dry Needling, Electrical stimulation, Traction, Cryotherapy, vasopneumatic deviceMoist heat, Taping, Ultrasound, Ionotophoresis '4mg'$ /ml Dexamethasone, and Manual therapy.  All included unless contraindicated  PLAN FOR NEXT SESSION:   measure ankle ROM. Cont with there exer & therapeutic activities to tolerance,   Jamey Reas, PT, DPT 05/24/2022, 2:11 PM

## 2022-05-26 ENCOUNTER — Encounter: Payer: Self-pay | Admitting: Physical Therapy

## 2022-05-26 ENCOUNTER — Ambulatory Visit: Payer: Medicare PPO | Admitting: Physical Therapy

## 2022-05-26 DIAGNOSIS — R2689 Other abnormalities of gait and mobility: Secondary | ICD-10-CM

## 2022-05-26 DIAGNOSIS — M6281 Muscle weakness (generalized): Secondary | ICD-10-CM

## 2022-05-26 DIAGNOSIS — M25672 Stiffness of left ankle, not elsewhere classified: Secondary | ICD-10-CM | POA: Diagnosis not present

## 2022-05-26 DIAGNOSIS — M25572 Pain in left ankle and joints of left foot: Secondary | ICD-10-CM

## 2022-05-26 DIAGNOSIS — R600 Localized edema: Secondary | ICD-10-CM

## 2022-05-26 DIAGNOSIS — M25562 Pain in left knee: Secondary | ICD-10-CM | POA: Diagnosis not present

## 2022-05-26 DIAGNOSIS — R2681 Unsteadiness on feet: Secondary | ICD-10-CM

## 2022-05-26 NOTE — Therapy (Signed)
OUTPATIENT PHYSICAL THERAPY LOWER EXTREMITY TREATMENT   Patient Name: Carolyn [name] MRN: [id number] DOB:[birth year], 67 y.o., female Today's Date: 05/26/2022  END OF SESSION:  PT End of Session - 05/26/22 0934     Visit Number 4    Number of Visits 30    Date for PT Re-Evaluation 08/13/22    Authorization Type [employer] PPO    Authorization Time Period $20 COPAY Approved  Authorization [id number]  Tracking [id number] PT VISITS 3/4-5/31/2024    Authorization - Number of Visits 12    Progress Note Due on Visit 10    PT Start Time 0933    PT Stop Time 1013    PT Time Calculation (min) 40 min    Equipment Utilized During Treatment Gait belt    Activity Tolerance Patient tolerated treatment well    Behavior During Therapy WFL for tasks assessed/performed               Past Medical History:  Diagnosis Date   Alcoholic cirrhosis ([place])    Chronic pancreatitis ([place])    Diabetes mellitus without complication ([place])    Endometriosis    GERD (gastroesophageal reflux disease)    Intractable nausea and vomiting 06/13/2020   Liver disease    Malabsorption    MALABSORPTION SYNDROME   Osteoporosis 03/2011   t score -2.5   Pancreatitis    due to cyst and tumors due to calcifications   Seizure ([place])    no seizure disorder, controlled w/ meds per patient   Vitamin D deficiency 2012   VIT D 15   Past Surgical History:  Procedure Laterality Date   APPENDECTOMY     BIOPSY  01/19/2022   Procedure: BIOPSY;  Surgeon: Lavena Bullion, DO;  Location: [facility];  Service: Gastroenterology;;   CHOLECYSTECTOMY     ESOPHAGOGASTRODUODENOSCOPY (EGD) WITH PROPOFOL N/A 01/19/2022   Procedure: ESOPHAGOGASTRODUODENOSCOPY (EGD) WITH PROPOFOL;  Surgeon: Lavena Bullion, DO;  Location: [facility];  Service: Gastroenterology;  Laterality: N/A;   FEEDING TUBE RELOCATION  2010   JEJUNOSTOMY FEEDING TUBE  1990   MULTIPLE GASTRIC SURGERIES     MYOMECTOMY     OPEN REDUCTION  INTERNAL FIXATION (ORIF) DISTAL RADIAL FRACTURE Left 06/29/2018   Procedure: OPEN REDUCTION INTERNAL FIXATION (ORIF)LEFT  DISTAL RADIAL FRACTURE;  Surgeon: Leanora Cover, MD;  Location: [facility];  Service: Orthopedics;  Laterality: Left;   ROUX-EN-Y GASTRIC BYPASS     TIBIA IM NAIL INSERTION Left 04/21/2022   Procedure: LEFT INTRAMEDULLARY (IM) NAIL TIBIAL;  Surgeon: Marybelle Killings, MD;  Location: [facility];  Service: Orthopedics;  Laterality: Left;   TIBIA IM NAIL INSERTION Left 05/05/2022   Procedure: LEFT TIBIAL NAIL DISTAL SCREW PLACEMENT;  Surgeon: Marybelle Killings, MD;  Location: [facility];  Service: Orthopedics;  Laterality: Left;   TUBAL LIGATION     Patient Active Problem List   Diagnosis Date Noted   Closed fracture of left tibia and fibula 05/05/2022   Displaced comminuted fracture of shaft of left tibia, initial encounter for closed fracture 04/21/2022   Closed fracture of proximal end of left fibula, initial encounter 04/21/2022   Leukocytosis 04/21/2022   Influenza A 04/06/2022   History of gastric ulcer 03/23/2022   Abnormal loss of weight 03/23/2022   Aspiration pneumonia of both lungs ([place]) 03/23/2022   Acute on chronic diastolic CHF (congestive heart failure) ([place]) 03/23/2022   NSVT (nonsustained ventricular tachycardia) ([place]) 03/20/2022   Localized swelling of right upper  extremity 03/19/2022   Cellulitis of left lower extremity 03/17/2022   Hypophosphatemia 03/17/2022   Anemia of chronic disease 03/17/2022   Sepsis ([place]) 03/14/2022   Severe sepsis ([place]) 03/13/2022   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) 01/11/2022   Hypoalbuminemia 01/11/2022   Pleural  effusion 01/10/2022   Abdominal distention 01/10/2022   Physical deconditioning 01/09/2022   Pressure injury of skin 01/08/2022   Hypokalemia 01/07/2022   Hypomagnesemia 01/07/2022   AKI (acute kidney injury) ([place]) 01/07/2022   Severe protein-calorie malnutrition ([place]) 01/07/2022   Cellulitis of right foot 01/07/2022   Normocytic anemia 01/07/2022   Osteomyelitis ([place]) 01/06/2022   Intractable nausea and vomiting [id number]   Metabolic acidosis, increased anion gap 06/12/2020   Seizure ([place]) 05/08/2017   Chronic pain syndrome 05/08/2017   Gastroesophageal reflux disease without esophagitis 05/08/2017   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[place]) 05/08/2017   Pancreatic steatorrhea 05/08/2017   Osteoporosis 03/16/2011   Endometriosis    Malabsorption    Chronic pancreatitis ([place])    Vitamin D deficiency     PCP: General PCP   REFERRING PROVIDER:  Marybelle Killings, MD   REFERRING DIAG:  S82.252A (ICD-10-CM) - Displaced comminuted fracture of shaft of left tibia, initial encounter for closed fracture  S82.832A (ICD-10-CM) - Closed fracture of proximal end of left fibula, initial encounter    THERAPY DIAG:  Muscle weakness (generalized)  Acute pain of left knee  Pain in left ankle and joints of left foot  Stiffness of left ankle, not elsewhere classified  Other abnormalities of gait and mobility  Localized edema  Unsteadiness on feet  Rationale for Evaluation and Treatment: Rehabilitation  ONSET DATE: 04/21/2022, 1st ankle sg, 05/05/2022 2nd surgery  SUBJECTIVE:   SUBJECTIVE STATEMENT:  she tried sleeping without CAM boot with no issues but is afraid her dogs will scratch leg. So she continues to use CAM boot at night.   PERTINENT HISTORY: DM, Alcoholism in remission, chronic pancreatitis, hepatic cirrhosis, She had prolonged hospitalization 03/13/2022-1/29/204 with severe sepsis included hypoxic respiratory failure.   PAIN: NPRS scale: today knee 3/10 & ankle 4/10 and  since last PT  3/10 to 7/10 Pain location: left knee more patella & ankle more medially Pain description: ache,  Aggravating factors:  meds wearing off, over night with increased in morning Relieving factors: meds, elevating & not moving knee / ankle  PRECAUTIONS: Fall  WEIGHT BEARING RESTRICTIONS: Yes LLE WBAT as of 05/18/2022  FALLS:  Has patient fallen in last 6 months? Yes. Number of falls 1  LIVING ENVIRONMENT: Lives with: lives with their spouse and 3 dogs 62, 80 & 50# Lives in: House single story  Stairs: Yes: External: 6 (long width)  steps; on right going up temporary ramp primary entrance Has following equipment at home: Gilford Rile - 2 wheeled, Wheelchair (manual), Shower bench, bed side commode, Grab bars, and Ramped entry  OCCUPATION: retired   PLOF: Independent  PATIENT GOALS:   get back to function in house & access community  Next MD visit:  05/18/2022  OBJECTIVE:   DIAGNOSTIC FINDINGS: 05/11/2022: Post distal tibial screw placement.  AP knee AP ankle appear to be in good rotational alignment.   PATIENT SURVEYS:  05/17/2022 /  Evaluation:  FOTO intake:  34%  predicted:  47%  COGNITION: 05/17/2022 / Evaluation:   Overall cognitive status: WFL    SENSATION: 05/17/2022 / Evaluation:   WFL for knee area but unable to test ankle as wrapped for wound  EDEMA:  05/17/2022 / Evaluation:  left foot & ankle appear edematous but wrapped due to wound.  POSTURE:  05/17/2022 / Evaluation:  rounded shoulders, forward head, increased thoracic kyphosis, flexed trunk , and weight shift right  PALPATION: 05/17/2022 / Evaluation: not tested due to wrapped for wound  LOWER EXTREMITY ROM:   ROM Left Eval 05/17/22 Left 05/26/22  Hip flexion    Hip extension    Hip abduction    Hip adduction    Hip internal rotation    Hip external rotation    Knee flexion    Knee extension Seated A: LAQ -22* Seated  A: LAQ -11*  Ankle dorsiflexion  Seated knee flexed A: -3* P: +2*   Ankle  plantarflexion  P & A 19*  Ankle inversion    Ankle eversion    Ankle Not tested at evaluation due to wound with bandages.  (Blank rows = not tested)  LOWER EXTREMITY MMT:  MMT Right eval Left eval  Hip flexion    Hip extension    Hip abduction    Hip adduction    Hip internal rotation    Hip external rotation    Knee flexion  3-/5  Knee extension  3-/5  Ankle dorsiflexion    Ankle plantarflexion    Ankle inversion    Ankle eversion    Ankle Not tested at evaluation due to Toe touch Weightbearing status.  (Blank rows = not tested)   FUNCTIONAL TESTS:  05/17/2022 / Evaluation: 18 inch chair transfer:  requires armrests or RW to push up to arise.  Requires RW support for stabilization  GAIT: 05/17/2022 / Evaluation: Distance walked: 30' Assistive device utilized: Environmental consultant - 2 wheeled Level of assistance: SBA Comments: NWB LLE.  Turns with pivoting on foot incorrectly.    TODAY'S TREATMENT                                                                          DATE: 05/26/2022: Self-care: PT demo donning XL Vivewear compression sock with strip bw toes.  Recommending staying in XL which is 2 sizes too large initially.  Wear during awake hours. Pt verbalized understanding.    Therapeutic Exercise: NuStep seat 10 with BLEs & BUEs level 7 for 4 min & BLEs only level 5 for 4 min Seated LAQ & active knee flexion with opposing knee motion 15 reps Leg press BLEs 75# 10 reps 3 sets  Neuromuscular Re-ed Standing with 1/2" under RLE to offset height of CAM boot: UE resistance to facilitate UE strength, core stabilization, WBAT BLEs and balance. Single UEs & BUEs rows, forward reach & upward reach 10 reps ea.  MinA for balance. Pt reports no increase in pain.  Pt amb in / out & around clinic with RW CAM boot WBAT safely.    05/24/2022: Therapeutic Exercise: NuStep seat 10 with BLEs & BUEs level 5 for 5.5 min & BLEs only level 3 for 2.5 min  Hamstring stretch  LLE long sit with  strap DF to neutral 30 sec hold 3 reps Seated forefoot adduction & abduction 10 reps Picking up marbles with toes - 10 reps directly in front for DF, 10 reps laterally to facilitate some eversion & 10 reps medially to facilitate some inversion.  Neuromuscular Re-ed Standing with 1/2" under RLE to offset height of CAM boot: UE resistance to facilitate UE strength, core stabilization, WBAT BLEs and balance. Single UEs & BUEs rows, forward reach & upward reach 10 reps ea.  MinA for balance. Pt reports no increase in pain.  Manual Therapy Gentle PROM left ankle ankle DF, inversion & eversion  Gait Training: Pt amb 80'  X 2 with RW WBAT with SBA with CAM boot.    05/19/2022: Therapeutic Exercise: NuStep seat 10 with BLEs & BUEs level 4 for 8 min Seated open chain left ankle DF/PF 10 reps & circles CW/CCW 10 reps; alphabet 1 rep   Manual Therapy Gentle PROM left ankle ankle DF, inversion & eversion   Gait Training: PT demo & verbal cues on WBAT with weight shift over LLE in stance. Pt amb with CAM boot LLE WBAT with RW 50' initially CGA then SBA. Pt amb 56' with RW WBAT with SBA.  PT instructed in starting with short distance gait in home with CAM boot only using pain as guidance for rest or taking more weight on UEs. Pt verbalized understanding.    Self-care: PT recommending checking stocknette for dampness. Pt verbalized understanding.  She measures for medium size Vivewear sock.     PATIENT EDUCATION:  Education details: HEP, POC Person educated: Patient Education method: Consulting civil engineer, Demonstration, Verbal cues, and Handouts Education comprehension: verbalized understanding, returned demonstration, and verbal cues required  HOME EXERCISE PROGRAM: Access Code: [email or web address] URL: [email or web address] Date: 05/17/2022 Prepared by: Jamey Reas  Exercises - seated marching  - 2 x daily - 7 x weekly - 3 sets - 10 reps - 5 seconds hold - Seated Hip Abduction  - 2 x daily -  7 x weekly - 3 sets - 10 reps - 5 seconds hold - Seated Hamstring Stretch  - 2 x daily - 7 x weekly - 1 sets - 3 reps - 20-30 seconds hold - Seated Ankle Pumps  - 2 x daily - 7 x weekly - 3 sets - 10 reps - 5 seconds hold - Seated Ankle Circles  - 2 x daily - 7 x weekly - 3 sets - 10 reps  ASSESSMENT:  CLINICAL IMPRESSION: Patient improved LAQ active range.  She appears to understand use of Vivewear compression sock.    Patient continues to benefit from skilled PT.  OBJECTIVE IMPAIRMENTS: Abnormal gait, cardiopulmonary status limiting activity, decreased activity tolerance, decreased balance, decreased coordination, decreased endurance, decreased knowledge of condition, decreased knowledge of use of DME, decreased mobility, difficulty walking, decreased ROM, decreased strength, increased edema, increased muscle spasms, impaired flexibility, impaired sensation, postural dysfunction, and pain.   ACTIVITY LIMITATIONS: carrying, lifting, bending, standing, squatting, sleeping, stairs, transfers, reach over head, and locomotion level  PARTICIPATION LIMITATIONS: meal prep, cleaning, driving, and community activity  PERSONAL FACTORS: Fitness, Time since onset of injury/illness/exacerbation, and 3+ comorbidities: see PMH  are also affecting patient's functional outcome.   REHAB POTENTIAL: Good  CLINICAL DECISION MAKING: Stable/uncomplicated  EVALUATION COMPLEXITY: Low   GOALS: Goals reviewed with patient? Yes  SHORT TERM GOALS: (target date for Short term goals are 30 days 06/16/2022)   1.  Patient will demonstrate independent use of home  exercise program to maintain progress from in clinic treatments.  Goal status: New  2. Patient amb 39' with RW with TDWBing LLE (or higher if MD permits) with supervision.  Goal Status: MET 05/26/2022  LONG TERM GOALS: (target dates for all long term goals are 90 days 08/13/2022)   1. Patient will demonstrate/report pain at worst less than or equal to  2/10 to facilitate minimal limitation in daily activity secondary to pain symptoms. Goal status: New   2. Patient will demonstrate independent use of home exercise program to facilitate ability to maintain/progress functional gains from skilled physical therapy services. Goal status: New   3. Patient will demonstrate FOTO outcome > or = 47 % to indicate reduced disability due to condition.  Goal status: New   4.  Patient will demonstrate LLE MMT 5/5 throughout to faciltiate usual transfers, stairs, squatting at [facility] for daily life.   Goal status: New   5.  Patient ambulates >500' with LRAD and neg ramp, curb & stairs single rail modified independent.  Goal status: New   6.  AROM of Left knee WFL & left ankle within 75% of right ankle Goal status: New   PLAN:  PT FREQUENCY: 2-3 x/week  PT DURATION: 13 weeks (90 days)  PLANNED INTERVENTIONS: Therapeutic exercises, Therapeutic activity, Neuro Muscular re-education, Balance training, Gait training, Patient/Family education, Joint mobilization, Stair training, DME instructions, Dry Needling, Electrical stimulation, Traction, Cryotherapy, vasopneumatic deviceMoist heat, Taping, Ultrasound, Ionotophoresis '4mg'$ /ml Dexamethasone, and Manual therapy.  All included unless contraindicated  PLAN FOR NEXT SESSION:   check on compression sock tolerance, Cont with there exer & therapeutic activities to tolerance,   Jamey Reas, PT, DPT 05/26/2022, 10:32 AM

## 2022-06-01 ENCOUNTER — Ambulatory Visit (INDEPENDENT_AMBULATORY_CARE_PROVIDER_SITE_OTHER): Payer: Medicare PPO | Admitting: Orthopedic Surgery

## 2022-06-01 ENCOUNTER — Ambulatory Visit: Payer: Medicare PPO | Admitting: Physical Therapy

## 2022-06-01 ENCOUNTER — Encounter: Payer: Self-pay | Admitting: Physical Therapy

## 2022-06-01 DIAGNOSIS — M25572 Pain in left ankle and joints of left foot: Secondary | ICD-10-CM

## 2022-06-01 DIAGNOSIS — M25672 Stiffness of left ankle, not elsewhere classified: Secondary | ICD-10-CM | POA: Diagnosis not present

## 2022-06-01 DIAGNOSIS — R2689 Other abnormalities of gait and mobility: Secondary | ICD-10-CM

## 2022-06-01 DIAGNOSIS — M25562 Pain in left knee: Secondary | ICD-10-CM | POA: Diagnosis not present

## 2022-06-01 DIAGNOSIS — M6281 Muscle weakness (generalized): Secondary | ICD-10-CM | POA: Diagnosis not present

## 2022-06-01 DIAGNOSIS — R600 Localized edema: Secondary | ICD-10-CM

## 2022-06-01 DIAGNOSIS — S91002A Unspecified open wound, left ankle, initial encounter: Secondary | ICD-10-CM

## 2022-06-01 DIAGNOSIS — R2681 Unsteadiness on feet: Secondary | ICD-10-CM

## 2022-06-01 NOTE — Therapy (Signed)
OUTPATIENT PHYSICAL THERAPY LOWER EXTREMITY TREATMENT   Patient Name: Carolyn [name] MRN: [id number] DOB:1955/10/17, 67 y.o., female Today's Date: 06/01/2022  END OF SESSION:  PT End of Session - 06/01/22 1104     Visit Number 5    Number of Visits 30    Date for PT Re-Evaluation 08/13/22    Authorization Type [employer] PPO    Authorization Time Period $20 COPAY Approved  Authorization [id number]  Tracking [id number] PT VISITS 3/4-5/31/2024    Authorization - Visit Number 5    Authorization - Number of Visits 12    Progress Note Due on Visit 10    PT Start Time 1100    PT Stop Time 1143    PT Time Calculation (min) 43 min    Equipment Utilized During Treatment Gait belt    Activity Tolerance Patient tolerated treatment well    Behavior During Therapy WFL for tasks assessed/performed                Past Medical History:  Diagnosis Date   Alcoholic cirrhosis ([place])    Chronic pancreatitis ([place])    Diabetes mellitus without complication ([place])    Endometriosis    GERD (gastroesophageal reflux disease)    Intractable nausea and vomiting 06/13/2020   Liver disease    Malabsorption    MALABSORPTION SYNDROME   Osteoporosis 03/2011   t score -2.5   Pancreatitis    due to cyst and tumors due to calcifications   Seizure ([place])    no seizure disorder, controlled w/ meds per patient   Vitamin D deficiency 2012   VIT D 15   Past Surgical History:  Procedure Laterality Date   APPENDECTOMY     BIOPSY  01/19/2022   Procedure: BIOPSY;  Surgeon: Lavena Bullion, DO;  Location: [facility];  Service: Gastroenterology;;   CHOLECYSTECTOMY     ESOPHAGOGASTRODUODENOSCOPY (EGD) WITH PROPOFOL N/A 01/19/2022   Procedure: ESOPHAGOGASTRODUODENOSCOPY (EGD) WITH PROPOFOL;  Surgeon: Lavena Bullion, DO;  Location: [facility];  Service: Gastroenterology;  Laterality: N/A;   FEEDING TUBE RELOCATION  2010   JEJUNOSTOMY FEEDING TUBE  1990   MULTIPLE GASTRIC SURGERIES      MYOMECTOMY     OPEN REDUCTION INTERNAL FIXATION (ORIF) DISTAL RADIAL FRACTURE Left 06/29/2018   Procedure: OPEN REDUCTION INTERNAL FIXATION (ORIF)LEFT  DISTAL RADIAL FRACTURE;  Surgeon: Leanora Cover, MD;  Location: [facility];  Service: Orthopedics;  Laterality: Left;   ROUX-EN-Y GASTRIC BYPASS     TIBIA IM NAIL INSERTION Left 04/21/2022   Procedure: LEFT INTRAMEDULLARY (IM) NAIL TIBIAL;  Surgeon: Marybelle Killings, MD;  Location: [facility];  Service: Orthopedics;  Laterality: Left;   TIBIA IM NAIL INSERTION Left 05/05/2022   Procedure: LEFT TIBIAL NAIL DISTAL SCREW PLACEMENT;  Surgeon: Marybelle Killings, MD;  Location: [facility];  Service: Orthopedics;  Laterality: Left;   TUBAL LIGATION     Patient Active Problem List   Diagnosis Date Noted   Closed fracture of left tibia and fibula 05/05/2022   Displaced comminuted fracture of shaft of left tibia, initial encounter for closed fracture 04/21/2022   Closed fracture of proximal end of left fibula, initial encounter 04/21/2022   Leukocytosis 04/21/2022   Influenza A 04/06/2022   History of gastric ulcer 03/23/2022   Abnormal loss of weight 03/23/2022   Aspiration pneumonia of both lungs (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   Abnormal CT of the abdomen 01/19/2022   Chronic gastric ulcer with perforation ([place]) [id number]   Alcoholic cirrhosis of liver without ascites ([place]) 01/18/2022   Occult blood in stools 01/18/2022   Portal hypertensive gastropathy ([place]) 01/18/2022   Anasarca 01/12/2022   Acute respiratory failure with hypoxia ([place]) 01/11/2022    Hypoalbuminemia 01/11/2022   Pleural effusion 01/10/2022   Abdominal distention 01/10/2022   Physical deconditioning 01/09/2022   Pressure injury of skin 01/08/2022   Hypokalemia 01/07/2022   Hypomagnesemia 01/07/2022   AKI (acute kidney injury) ([place]) 01/07/2022   Severe protein-calorie malnutrition ([place]) 01/07/2022   Cellulitis of right foot 01/07/2022   Normocytic anemia 01/07/2022   Osteomyelitis ([place]) 01/06/2022   Intractable nausea and vomiting [id number]   Metabolic acidosis, increased anion gap 06/12/2020   Seizure ([place]) 05/08/2017   Chronic pain syndrome 05/08/2017   Gastroesophageal reflux disease without esophagitis 05/08/2017   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[place]) 05/08/2017   Pancreatic steatorrhea 05/08/2017   Osteoporosis 03/16/2011   Endometriosis    Malabsorption    Chronic pancreatitis ([place])    Vitamin D deficiency     PCP: General PCP   REFERRING PROVIDER:  Marybelle Killings, MD   REFERRING DIAG:  S82.252A (ICD-10-CM) - Displaced comminuted fracture of shaft of left tibia, initial encounter for closed fracture  S82.832A (ICD-10-CM) - Closed fracture of proximal end of left fibula, initial encounter    THERAPY DIAG:  Muscle weakness (generalized)  Acute pain of left knee  Pain in left ankle and joints of left foot  Stiffness of left ankle, not elsewhere classified  Other abnormalities of gait and mobility  Localized edema  Unsteadiness on feet  Rationale for Evaluation and Treatment: Rehabilitation  ONSET DATE: 04/21/2022, 1st ankle sg, 05/05/2022 2nd surgery  SUBJECTIVE:   SUBJECTIVE STATEMENT: The wound on her ankle has been weeping.    PERTINENT HISTORY: DM, Alcoholism in remission, chronic pancreatitis, hepatic cirrhosis, She had prolonged hospitalization 03/13/2022-1/29/204 with severe sepsis included hypoxic respiratory failure.   PAIN: NPRS scale: today knee 5/10 & ankle 6/10 and since last PT lowest 3/10 knee 5/10 ankle & highest knee  5/10 ankle 7/10 Pain location: left knee more patella & ankle more medially Pain description: ache,  Aggravating factors:  meds wearing off, over night with increased in morning Relieving factors: meds, elevating & not moving knee / ankle  PRECAUTIONS: Fall  WEIGHT BEARING RESTRICTIONS: Yes LLE WBAT as of 05/18/2022  FALLS:  Has patient fallen in last 6 months? Yes. Number of falls 1  LIVING ENVIRONMENT: Lives with: lives with their spouse and 3 dogs 26, 58 & 50# Lives in: House single story  Stairs: Yes: External: 6 (long width)  steps; on right going up temporary ramp primary entrance Has following equipment at home: Gilford Rile - 2 wheeled, Wheelchair (manual), Shower bench, bed side commode, Grab bars, and Ramped entry  OCCUPATION: retired   PLOF: Independent  PATIENT GOALS:   get back to function in house & access community  Next MD visit:  05/18/2022  OBJECTIVE:   DIAGNOSTIC FINDINGS: 05/11/2022: Post distal tibial screw placement.  AP knee AP ankle appear to be in good rotational alignment.   PATIENT SURVEYS:  05/17/2022 / Evaluation:  FOTO intake:  34%  predicted:  47%  COGNITION: 05/17/2022 / Evaluation:   Overall cognitive status: WFL    SENSATION: 05/17/2022 / Evaluation:   WFL for knee area but unable to test ankle as wrapped for wound  EDEMA:  05/17/2022 / Evaluation:  left foot & ankle appear edematous but wrapped due to wound.  POSTURE:  05/17/2022 / Evaluation:  rounded shoulders, forward head, increased thoracic kyphosis, flexed trunk , and weight shift right  PALPATION: 05/17/2022 / Evaluation: not tested due to wrapped for wound  LOWER EXTREMITY ROM:   ROM Left Eval 05/17/22 Left 05/26/22  Hip flexion    Hip extension    Hip abduction    Hip adduction    Hip internal rotation    Hip external rotation    Knee flexion    Knee extension Seated A: LAQ -22* Seated  A: LAQ -11*  Ankle dorsiflexion  Seated knee flexed A: -3* P: +2*   Ankle plantarflexion  P & A  19*  Ankle inversion    Ankle eversion    Ankle Not tested at evaluation due to wound with bandages.  (Blank rows = not tested)  LOWER EXTREMITY MMT:  MMT Right eval Left eval  Hip flexion    Hip extension    Hip abduction    Hip adduction    Hip internal rotation    Hip external rotation    Knee flexion  3-/5  Knee extension  3-/5  Ankle dorsiflexion    Ankle plantarflexion    Ankle inversion    Ankle eversion    Ankle Not tested at evaluation due to Toe touch Weightbearing status.  (Blank rows = not tested)   FUNCTIONAL TESTS:  05/17/2022 / Evaluation: 18 inch chair transfer:  requires armrests or RW to push up to arise.  Requires RW support for stabilization  GAIT: 05/17/2022 / Evaluation: Distance walked: 30' Assistive device utilized: Environmental consultant - 2 wheeled Level of assistance: SBA Comments: NWB LLE.  Turns with pivoting on foot incorrectly.    TODAY'S TREATMENT                                                                          DATE: 06/01/2022: Therapeutic Exercise: NuStep seat 10 with BLEs & BUEs level 7 for 4 min & BLEs only level 5 for 4 min Leg press BLEs 75# 15 reps 2 sets;  LLE only 25# (tried 37# & 31# but too strenous) 10 reps 2 sets   Neuromuscular Re-ed Standing balance in //bars with PT tactile cues, mirror & verbal cues hip width stance on foam pad - head turns right/left, up/down & diagonals and static eyes closed 10 sec 3 reps Rocker board with BUE support right / level / left & forward / level / backward 1 min ea.  Pt amb in / out & around clinic with RW CAM boot WBAT safely.  Due to pt report ankle wounds are weeping more. PT messaged Dr. Lorin Mercy CMA & Dr. Lorin Mercy came to PT clinic.  He recommended having Dr. Sharol Given look at them.  PT arranged for pt to see Dr. Sharol Given after PT today.    05/26/2022: Self-care: PT demo donning XL Vivewear compression sock with  strip bw toes.  Recommending staying in XL which is 2 sizes too large initially.  Wear during  awake hours. Pt verbalized understanding.    Therapeutic Exercise: NuStep seat 10 with BLEs & BUEs level 7 for 4 min & BLEs only level 5 for 4 min Seated LAQ & active knee flexion with opposing knee motion 15 reps Leg press BLEs 75# 10 reps 3 sets  Neuromuscular Re-ed Standing with 1/2" under RLE to offset height of CAM boot: UE resistance to facilitate UE strength, core stabilization, WBAT BLEs and balance. Single UEs & BUEs rows, forward reach & upward reach 10 reps ea.  MinA for balance. Pt reports no increase in pain.  Pt amb in / out & around clinic with RW CAM boot WBAT safely.    05/24/2022: Therapeutic Exercise: NuStep seat 10 with BLEs & BUEs level 5 for 5.5 min & BLEs only level 3 for 2.5 min  Hamstring stretch LLE long sit with strap DF to neutral 30 sec hold 3 reps Seated forefoot adduction & abduction 10 reps Picking up marbles with toes - 10 reps directly in front for DF, 10 reps laterally to facilitate some eversion & 10 reps medially to facilitate some inversion.  Neuromuscular Re-ed Standing with 1/2" under RLE to offset height of CAM boot: UE resistance to facilitate UE strength, core stabilization, WBAT BLEs and balance. Single UEs & BUEs rows, forward reach & upward reach 10 reps ea.  MinA for balance. Pt reports no increase in pain.  Manual Therapy Gentle PROM left ankle ankle DF, inversion & eversion  Gait Training: Pt amb 80'  X 2 with RW WBAT with SBA with CAM boot.     PATIENT EDUCATION:  Education details: HEP, POC Person educated: Patient Education method: Consulting civil engineer, Demonstration, Verbal cues, and Handouts Education comprehension: verbalized understanding, returned demonstration, and verbal cues required  HOME EXERCISE PROGRAM: Access Code: [email or web address] URL: [email or web address] Date: 05/17/2022 Prepared by: Jamey Reas  Exercises - seated marching  - 2 x daily - 7 x weekly - 3 sets - 10 reps - 5 seconds hold - Seated Hip  Abduction  - 2 x daily - 7 x weekly - 3 sets - 10 reps - 5 seconds hold - Seated Hamstring Stretch  - 2 x daily - 7 x weekly - 1 sets - 3 reps - 20-30 seconds hold - Seated Ankle Pumps  - 2 x daily - 7 x weekly - 3 sets - 10 reps - 5 seconds hold - Seated Ankle Circles  - 2 x daily - 7 x weekly - 3 sets - 10 reps  ASSESSMENT:  CLINICAL IMPRESSION: Patient was challenged by standing balance activities PT introduced today.  Her gait with RW WBAT with CAM boot appears safe. Pt associates increase in pain with wounds which are being assessed by Dr. Sharol Given today.   Patient continues to benefit from skilled PT.  OBJECTIVE IMPAIRMENTS: Abnormal gait, cardiopulmonary status limiting activity, decreased activity tolerance, decreased balance, decreased coordination, decreased endurance, decreased knowledge of condition, decreased knowledge of use of DME, decreased mobility, difficulty walking, decreased ROM, decreased strength, increased edema, increased muscle spasms, impaired flexibility, impaired sensation, postural dysfunction, and pain.   ACTIVITY LIMITATIONS: carrying, lifting, bending, standing, squatting, sleeping, stairs, transfers, reach over head, and locomotion level  PARTICIPATION LIMITATIONS: meal prep, cleaning, driving, and community activity  PERSONAL FACTORS: Fitness, Time since onset of injury/illness/exacerbation, and 3+ comorbidities: see PMH  are also affecting patient's functional outcome.   REHAB POTENTIAL:  Good  CLINICAL DECISION MAKING: Stable/uncomplicated  EVALUATION COMPLEXITY: Low   GOALS: Goals reviewed with patient? Yes  SHORT TERM GOALS: (target date for Short term goals are 30 days 06/16/2022)   1.  Patient will demonstrate independent use of home exercise program to maintain progress from in clinic treatments.  Goal status: Ongoing 06/01/2022  2. Patient amb 21' with RW with TDWBing LLE (or higher if MD permits) with supervision.  Goal Status: MET  05/26/2022  LONG TERM GOALS: (target dates for all long term goals are 90 days 08/13/2022)   1. Patient will demonstrate/report pain at worst less than or equal to 2/10 to facilitate minimal limitation in daily activity secondary to pain symptoms. Goal status: New   2. Patient will demonstrate independent use of home exercise program to facilitate ability to maintain/progress functional gains from skilled physical therapy services. Goal status: New   3. Patient will demonstrate FOTO outcome > or = 47 % to indicate reduced disability due to condition.  Goal status: New   4.  Patient will demonstrate LLE MMT 5/5 throughout to faciltiate usual transfers, stairs, squatting at [facility] for daily life.   Goal status: New   5.  Patient ambulates >500' with LRAD and neg ramp, curb & stairs single rail modified independent.  Goal status: New   6.  AROM of Left knee WFL & left ankle within 75% of right ankle Goal status: New   PLAN:  PT FREQUENCY: 2-3 x/week  PT DURATION: 13 weeks (90 days)  PLANNED INTERVENTIONS: Therapeutic exercises, Therapeutic activity, Neuro Muscular re-education, Balance training, Gait training, Patient/Family education, Joint mobilization, Stair training, DME instructions, Dry Needling, Electrical stimulation, Traction, Cryotherapy, vasopneumatic deviceMoist heat, Taping, Ultrasound, Ionotophoresis 4mg /ml Dexamethasone, and Manual therapy.  All included unless contraindicated  PLAN FOR NEXT SESSION:   check Dr. Sharol Given note, Cont with there exer & therapeutic activities to tolerance including balance activities   Jamey Reas, PT, DPT 06/01/2022, 12:35 PM

## 2022-06-02 ENCOUNTER — Other Ambulatory Visit: Payer: Self-pay | Admitting: Internal Medicine

## 2022-06-02 DIAGNOSIS — Z515 Encounter for palliative care: Secondary | ICD-10-CM | POA: Insufficient documentation

## 2022-06-02 MED ORDER — LIOTHYRONINE SODIUM 25 MCG PO TABS: 25.0000 ug | ORAL_TABLET | Freq: Every day | ORAL | 0 refills | Status: DC

## 2022-06-02 MED ORDER — FENTANYL 50 MCG/HR TD PT72: 1.0000 | MEDICATED_PATCH | TRANSDERMAL | 0 refills | Status: DC

## 2022-06-02 MED ORDER — HYDROMORPHONE HCL 2 MG PO TABS: 2.0000 mg | ORAL_TABLET | Freq: Two times a day (BID) | ORAL | 0 refills | Status: DC | PRN

## 2022-06-02 MED ORDER — METOPROLOL TARTRATE 25 MG PO TABS: 12.5000 mg | ORAL_TABLET | Freq: Two times a day (BID) | ORAL | 3 refills | Status: DC

## 2022-06-02 MED ORDER — LEVOTHYROXINE SODIUM 50 MCG PO TABS: 50.0000 ug | ORAL_TABLET | Freq: Every day | ORAL | 3 refills | Status: DC

## 2022-06-02 MED ORDER — PANCRELIPASE (LIP-PROT-AMYL) 12000-38000 UNITS PO CPEP: 24000.0000 [IU] | ORAL_CAPSULE | Freq: Three times a day (TID) | ORAL | 2 refills | Status: DC

## 2022-06-02 MED ORDER — FUROSEMIDE 20 MG PO TABS: 20.0000 mg | ORAL_TABLET | Freq: Every day | ORAL | 0 refills | Status: DC

## 2022-06-02 MED ORDER — GABAPENTIN 300 MG PO CAPS: 300.0000 mg | ORAL_CAPSULE | Freq: Two times a day (BID) | ORAL | 1 refills | Status: DC

## 2022-06-03 ENCOUNTER — Encounter: Payer: Self-pay | Admitting: Physical Therapy

## 2022-06-03 ENCOUNTER — Ambulatory Visit: Payer: Medicare PPO | Admitting: Physical Therapy

## 2022-06-03 DIAGNOSIS — M25672 Stiffness of left ankle, not elsewhere classified: Secondary | ICD-10-CM

## 2022-06-03 DIAGNOSIS — M25562 Pain in left knee: Secondary | ICD-10-CM | POA: Diagnosis not present

## 2022-06-03 DIAGNOSIS — R2689 Other abnormalities of gait and mobility: Secondary | ICD-10-CM

## 2022-06-03 DIAGNOSIS — M25572 Pain in left ankle and joints of left foot: Secondary | ICD-10-CM | POA: Diagnosis not present

## 2022-06-03 DIAGNOSIS — R600 Localized edema: Secondary | ICD-10-CM

## 2022-06-03 DIAGNOSIS — R2681 Unsteadiness on feet: Secondary | ICD-10-CM

## 2022-06-03 DIAGNOSIS — M6281 Muscle weakness (generalized): Secondary | ICD-10-CM | POA: Diagnosis not present

## 2022-06-03 NOTE — Therapy (Signed)
OUTPATIENT PHYSICAL THERAPY LOWER EXTREMITY TREATMENT   Patient Name: Carolyn [name] MRN: [id number] DOB:1956-01-31, 67 y.o., female Today's Date: 06/03/2022  END OF SESSION:  PT End of Session - 06/03/22 1109     Visit Number 6    Number of Visits 30    Date for PT Re-Evaluation 08/13/22    Authorization Type [employer] PPO    Authorization Time Period $20 COPAY Approved  Authorization [id number]  Tracking [id number] PT VISITS 3/4-5/31/2024    Authorization - Visit Number 6    Authorization - Number of Visits 12    Progress Note Due on Visit 10    PT Start Time [id number]    PT Stop Time 1145    PT Time Calculation (min) 43 min    Equipment Utilized During Treatment Gait belt    Activity Tolerance Patient tolerated treatment well    Behavior During Therapy WFL for tasks assessed/performed                Past Medical History:  Diagnosis Date   Alcoholic cirrhosis ([place])    Chronic pancreatitis ([place])    Diabetes mellitus without complication ([place])    Endometriosis    GERD (gastroesophageal reflux disease)    Intractable nausea and vomiting 06/13/2020   Liver disease    Malabsorption    MALABSORPTION SYNDROME   Osteoporosis 03/2011   t score -2.5   Pancreatitis    due to cyst and tumors due to calcifications   Seizure ([place])    no seizure disorder, controlled w/ meds per patient   Vitamin D deficiency 2012   VIT D 15   Past Surgical History:  Procedure Laterality Date   APPENDECTOMY     BIOPSY  01/19/2022   Procedure: BIOPSY;  Surgeon: Lavena Bullion, DO;  [facility];  Service: Gastroenterology;;   CHOLECYSTECTOMY     ESOPHAGOGASTRODUODENOSCOPY (EGD) WITH PROPOFOL N/A 01/19/2022   Procedure: ESOPHAGOGASTRODUODENOSCOPY (EGD) WITH PROPOFOL;  Surgeon: Lavena Bullion, DO;  [facility];  Service: Gastroenterology;  Laterality: N/A;   FEEDING TUBE RELOCATION  2010   JEJUNOSTOMY FEEDING TUBE  1990   MULTIPLE GASTRIC SURGERIES      MYOMECTOMY     OPEN REDUCTION INTERNAL FIXATION (ORIF) DISTAL RADIAL FRACTURE Left 06/29/2018   Procedure: OPEN REDUCTION INTERNAL FIXATION (ORIF)LEFT  DISTAL RADIAL FRACTURE;  Surgeon: Leanora Cover, MD;  Location: [facility];  Service: Orthopedics;  Laterality: Left;   ROUX-EN-Y GASTRIC BYPASS     TIBIA IM NAIL INSERTION Left 04/21/2022   Procedure: LEFT INTRAMEDULLARY (IM) NAIL TIBIAL;  Surgeon: Marybelle Killings, MD;  Location: [facility];  Service: Orthopedics;  Laterality: Left;   TIBIA IM NAIL INSERTION Left 05/05/2022   Procedure: LEFT TIBIAL NAIL DISTAL SCREW PLACEMENT;  Surgeon: Marybelle Killings, MD;  Location: [facility];  Service: Orthopedics;  Laterality: Left;   TUBAL LIGATION     Patient Active Problem List   Diagnosis Date Noted   Palliative care patient 06/02/2022   Closed fracture of left tibia and fibula 05/05/2022   Displaced comminuted fracture of shaft of left tibia, initial encounter for closed fracture 04/21/2022   Closed fracture of proximal end of left fibula, initial encounter 04/21/2022   Leukocytosis 04/21/2022   Influenza A 04/06/2022   History of gastric ulcer 03/23/2022   Abnormal loss of weight 03/23/2022   Aspiration pneumonia of both lungs ([place]) 03/23/2022   Acute on chronic diastolic CHF (congestive heart failure) ([place]) 03/23/2022  NSVT (nonsustained ventricular tachycardia) ([facility]) 03/20/2022   Localized swelling of right upper extremity 03/19/2022   Cellulitis of left lower extremity 03/17/2022   Hypophosphatemia 03/17/2022   Anemia of chronic disease 03/17/2022   Sepsis ([place]) 03/14/2022   Severe sepsis ([place]) 03/13/2022   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   Abnormal CT of the abdomen 01/19/2022   Chronic gastric ulcer with perforation ([place]) [id number]   Alcoholic cirrhosis of liver without ascites ([place]) 01/18/2022   Occult blood in stools 01/18/2022   Portal hypertensive gastropathy ([place]) 01/18/2022   Anasarca 01/12/2022   Acute respiratory  failure with hypoxia ([place]) 01/11/2022   Hypoalbuminemia 01/11/2022   Pleural effusion 01/10/2022   Abdominal distention 01/10/2022   Physical deconditioning 01/09/2022   Pressure injury of skin 01/08/2022   Hypokalemia 01/07/2022   Hypomagnesemia 01/07/2022   AKI (acute kidney injury) ([place]) 01/07/2022   Severe protein-calorie malnutrition ([place]) 01/07/2022   Cellulitis of right foot 01/07/2022   Normocytic anemia 01/07/2022   Osteomyelitis ([place]) 01/06/2022   Intractable nausea and vomiting [id number]   Metabolic acidosis, increased anion gap 06/12/2020   Seizure ([place]) 05/08/2017   Chronic pain syndrome 05/08/2017   Gastroesophageal reflux disease without esophagitis 05/08/2017   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[place]) 05/08/2017   Pancreatic steatorrhea 05/08/2017   Osteoporosis 03/16/2011   Endometriosis    Malabsorption    Chronic pancreatitis ([place])    Vitamin D deficiency     PCP: General PCP   REFERRING PROVIDER:  Marybelle Killings, MD   REFERRING DIAG:  S82.252A (ICD-10-CM) - Displaced comminuted fracture of shaft of left tibia, initial encounter for closed fracture  S82.832A (ICD-10-CM) - Closed fracture of proximal end of left fibula, initial encounter    THERAPY DIAG:  Muscle weakness (generalized)  Acute pain of left knee  Pain in left ankle and joints of left foot  Stiffness of left ankle, not elsewhere classified  Other abnormalities of gait and mobility  Localized edema  Unsteadiness on feet  Rationale for Evaluation and Treatment: Rehabilitation  ONSET DATE: 04/21/2022, 1st ankle sg, 05/05/2022 2nd surgery  SUBJECTIVE:   SUBJECTIVE STATEMENT: Dr. Sharol Given placed cushion ring around both ankles & right knee for wounds for 2 weeks.   If they do not heal, he may do artifical skin over wounds.   PERTINENT HISTORY: DM, Alcoholism in remission, chronic pancreatitis, hepatic cirrhosis, She had prolonged hospitalization 03/13/2022-1/29/204 with severe sepsis included  hypoxic respiratory failure.   PAIN: NPRS scale: today left knee 3/10 & ankle 5/10 and since last PT lowest 3/10 knee 5/10 ankle & highest knee 5/10 ankle 7/10 Pain location: left knee more patella & ankle more medially Pain description: ache,  Aggravating factors:  meds wearing off, over night with increased in morning Relieving factors: meds, elevating & not moving knee / ankle  PRECAUTIONS: Fall  WEIGHT BEARING RESTRICTIONS: Yes LLE WBAT as of 05/18/2022  FALLS:  Has patient fallen in last 6 months? Yes. Number of falls 1  LIVING ENVIRONMENT: Lives with: lives with their spouse and 3 dogs 85, 15 & 50# Lives in: House single story  Stairs: Yes: External: 6 (long width)  steps; on right going up temporary ramp primary entrance Has following equipment at home: Gilford Rile - 2 wheeled, Wheelchair (manual), Shower bench, bed side commode, Grab bars, and Ramped entry  OCCUPATION: retired   PLOF: Independent  PATIENT GOALS:   get back to function in house & access community  Next MD visit:  05/18/2022  OBJECTIVE:   DIAGNOSTIC FINDINGS: 05/11/2022:  Post distal tibial screw placement.  AP knee AP ankle appear to be in good rotational alignment.   PATIENT SURVEYS:  05/17/2022 / Evaluation:  FOTO intake:  34%  predicted:  47%  COGNITION: 05/17/2022 / Evaluation:   Overall cognitive status: WFL    SENSATION: 05/17/2022 / Evaluation:   WFL for knee area but unable to test ankle as wrapped for wound  EDEMA:  05/17/2022 / Evaluation:  left foot & ankle appear edematous but wrapped due to wound.  POSTURE:  05/17/2022 / Evaluation:  rounded shoulders, forward head, increased thoracic kyphosis, flexed trunk , and weight shift right  PALPATION: 05/17/2022 / Evaluation: not tested due to wrapped for wound  LOWER EXTREMITY ROM:   ROM Left Eval 05/17/22 Left 05/26/22  Hip flexion    Hip extension    Hip abduction    Hip adduction    Hip internal rotation    Hip external rotation    Knee flexion     Knee extension Seated A: LAQ -22* Seated  A: LAQ -11*  Ankle dorsiflexion  Seated knee flexed A: -3* P: +2*   Ankle plantarflexion  P & A 19*  Ankle inversion    Ankle eversion    Ankle Not tested at evaluation due to wound with bandages.  (Blank rows = not tested)  LOWER EXTREMITY MMT:  MMT Right eval Left eval  Hip flexion    Hip extension    Hip abduction    Hip adduction    Hip internal rotation    Hip external rotation    Knee flexion  3-/5  Knee extension  3-/5  Ankle dorsiflexion    Ankle plantarflexion    Ankle inversion    Ankle eversion    Ankle Not tested at evaluation due to Toe touch Weightbearing status.  (Blank rows = not tested)   FUNCTIONAL TESTS:  05/17/2022 / Evaluation: 18 inch chair transfer:  requires armrests or RW to push up to arise.  Requires RW support for stabilization  GAIT: 05/17/2022 / Evaluation: Distance walked: 30' Assistive device utilized: Environmental consultant - 2 wheeled Level of assistance: SBA Comments: NWB LLE.  Turns with pivoting on foot incorrectly.    TODAY'S TREATMENT                                                                          DATE: 06/03/2022: PT working with left shoe instead of CAM boot in clinic only. Pt reports no change in pain  Therapeutic Exercise: NuStep seat 10 with BLEs & BUEs level 7 for 8 min Leg press BLEs 75# 15 reps 2 sets;  single leg 25# 10 reps 2 sets BLEs Sit to/from stand 24" bar stool without UE assist 10 reps.  Neuromuscular Re-ed Standing balance in //bars with PT tactile cues, mirror & verbal cues hip width stance on foam pad - head turns right/left, up/down & diagonals and static eyes closed 10 sec 3 reps Rocker board with BUE support right / level / left & forward / level / backward 1 min ea. 1st set with square board 2 pivot points, 2nd set advanced to round board single pivot point.  Gait Training: Pt amb with RW 50' X 2 with shoe LLE  instead of CAM boot with WBAT supervision. No change  in pain.   06/01/2022: Therapeutic Exercise: NuStep seat 10 with BLEs & BUEs level 7 for 4 min & BLEs only level 5 for 4 min Leg press BLEs 75# 15 reps 2 sets;  LLE only 25# (tried 37# & 31# but too strenous) 10 reps 2 sets   Neuromuscular Re-ed Standing balance in //bars with PT tactile cues, mirror & verbal cues hip width stance on foam pad - head turns right/left, up/down & diagonals and static eyes closed 10 sec 3 reps Rocker board with BUE support right / level / left & forward / level / backward 1 min ea.  Pt amb in / out & around clinic with RW CAM boot WBAT safely.  Due to pt report ankle wounds are weeping more. PT messaged Dr. Lorin Mercy CMA & Dr. Lorin Mercy came to PT clinic.  He recommended having Dr. Sharol Given look at them.  PT arranged for pt to see Dr. Sharol Given after PT today.    05/26/2022: Self-care: PT demo donning XL Vivewear compression sock with strip bw toes.  Recommending staying in XL which is 2 sizes too large initially.  Wear during awake hours. Pt verbalized understanding.    Therapeutic Exercise: NuStep seat 10 with BLEs & BUEs level 7 for 4 min & BLEs only level 5 for 4 min Seated LAQ & active knee flexion with opposing knee motion 15 reps Leg press BLEs 75# 10 reps 3 sets  Neuromuscular Re-ed Standing with 1/2" under RLE to offset height of CAM boot: UE resistance to facilitate UE strength, core stabilization, WBAT BLEs and balance. Single UEs & BUEs rows, forward reach & upward reach 10 reps ea.  MinA for balance. Pt reports no increase in pain.  Pt amb in / out & around clinic with RW CAM boot WBAT safely.    05/24/2022: Therapeutic Exercise: NuStep seat 10 with BLEs & BUEs level 5 for 5.5 min & BLEs only level 3 for 2.5 min  Hamstring stretch LLE long sit with strap DF to neutral 30 sec hold 3 reps Seated forefoot adduction & abduction 10 reps Picking up marbles with toes - 10 reps directly in front for DF, 10 reps laterally to facilitate some eversion & 10 reps  medially to facilitate some inversion.  Neuromuscular Re-ed Standing with 1/2" under RLE to offset height of CAM boot: UE resistance to facilitate UE strength, core stabilization, WBAT BLEs and balance. Single UEs & BUEs rows, forward reach & upward reach 10 reps ea.  MinA for balance. Pt reports no increase in pain.  Manual Therapy Gentle PROM left ankle ankle DF, inversion & eversion  Gait Training: Pt amb 80'  X 2 with RW WBAT with SBA with CAM boot.     PATIENT EDUCATION:  Education details: HEP, POC Person educated: Patient Education method: Consulting civil engineer, Demonstration, Verbal cues, and Handouts Education comprehension: verbalized understanding, returned demonstration, and verbal cues required  HOME EXERCISE PROGRAM: Access Code: [email or web address] URL: [email or web address] Date: 05/17/2022 Prepared by: Jamey Reas  Exercises - seated marching  - 2 x daily - 7 x weekly - 3 sets - 10 reps - 5 seconds hold - Seated Hip Abduction  - 2 x daily - 7 x weekly - 3 sets - 10 reps - 5 seconds hold - Seated Hamstring Stretch  - 2 x daily - 7 x weekly - 1 sets - 3 reps - 20-30 seconds hold - Seated Ankle Pumps  - 2 x  daily - 7 x weekly - 3 sets - 10 reps - 5 seconds hold - Seated Ankle Circles  - 2 x daily - 7 x weekly - 3 sets - 10 reps  ASSESSMENT:  CLINICAL IMPRESSION: PT worked on gait, functional exercises & balance with shoe instead of CAM boot with WBAT still.  Patient had no increased pain with change.  She may have muscle soreness with new activity but no bone or joint pain.   Patient continues to benefit from skilled PT.  OBJECTIVE IMPAIRMENTS: Abnormal gait, cardiopulmonary status limiting activity, decreased activity tolerance, decreased balance, decreased coordination, decreased endurance, decreased knowledge of condition, decreased knowledge of use of DME, decreased mobility, difficulty walking, decreased ROM, decreased strength, increased edema, increased muscle  spasms, impaired flexibility, impaired sensation, postural dysfunction, and pain.   ACTIVITY LIMITATIONS: carrying, lifting, bending, standing, squatting, sleeping, stairs, transfers, reach over head, and locomotion level  PARTICIPATION LIMITATIONS: meal prep, cleaning, driving, and community activity  PERSONAL FACTORS: Fitness, Time since onset of injury/illness/exacerbation, and 3+ comorbidities: see PMH  are also affecting patient's functional outcome.   REHAB POTENTIAL: Good  CLINICAL DECISION MAKING: Stable/uncomplicated  EVALUATION COMPLEXITY: Low   GOALS: Goals reviewed with patient? Yes  SHORT TERM GOALS: (target date for Short term goals are 30 days 06/16/2022)   1.  Patient will demonstrate independent use of home exercise program to maintain progress from in clinic treatments.  Goal status: Ongoing 06/01/2022  2. Patient amb 61' with RW with TDWBing LLE (or higher if MD permits) with supervision.  Goal Status: MET 05/26/2022  LONG TERM GOALS: (target dates for all long term goals are 90 days 08/13/2022)   1. Patient will demonstrate/report pain at worst less than or equal to 2/10 to facilitate minimal limitation in daily activity secondary to pain symptoms. Goal status: New   2. Patient will demonstrate independent use of home exercise program to facilitate ability to maintain/progress functional gains from skilled physical therapy services. Goal status: New   3. Patient will demonstrate FOTO outcome > or = 47 % to indicate reduced disability due to condition.  Goal status: New   4.  Patient will demonstrate LLE MMT 5/5 throughout to faciltiate usual transfers, stairs, squatting at [facility] for daily life.   Goal status: New   5.  Patient ambulates >500' with LRAD and neg ramp, curb & stairs single rail modified independent.  Goal status: New   6.  AROM of Left knee WFL & left ankle within 75% of right ankle Goal status: New   PLAN:  PT FREQUENCY: 2-3  x/week  PT DURATION: 13 weeks (90 days)  PLANNED INTERVENTIONS: Therapeutic exercises, Therapeutic activity, Neuro Muscular re-education, Balance training, Gait training, Patient/Family education, Joint mobilization, Stair training, DME instructions, Dry Needling, Electrical stimulation, Traction, Cryotherapy, vasopneumatic deviceMoist heat, Taping, Ultrasound, Ionotophoresis 4mg /ml Dexamethasone, and Manual therapy.  All included unless contraindicated  PLAN FOR NEXT SESSION:   check if any bone or joint pain from session if not continue with shoe in PT only, Cont with there exer & therapeutic activities to tolerance including balance activities   Jamey Reas, PT, DPT 06/03/2022, 12:53 PM

## 2022-06-04 ENCOUNTER — Other Ambulatory Visit: Payer: Self-pay | Admitting: Internal Medicine

## 2022-06-04 ENCOUNTER — Other Ambulatory Visit (HOSPITAL_COMMUNITY): Payer: Self-pay

## 2022-06-04 MED ORDER — HYDROMORPHONE HCL 2 MG PO TABS
2.0000 mg | ORAL_TABLET | Freq: Two times a day (BID) | ORAL | 0 refills | Status: DC | PRN
  Filled 2022-06-04: qty 60, 30d supply, fill #0

## 2022-06-06 ENCOUNTER — Encounter: Payer: Self-pay | Admitting: Orthopedic Surgery

## 2022-06-06 NOTE — Progress Notes (Signed)
Office Visit Note   Patient: Carolyn [name]           Date of Birth: May 26, 1955           MRN: [id number] Visit Date: 06/01/2022              Requested by: No referring provider defined for this encounter. PCP: Pcp, No  Chief Complaint  Patient presents with   Left Ankle - Routine Post Op    Left tib nail surgery with Dr. Lorin Mercy 05/05/2022      HPI: Patient is a 67 year old woman who is seen for evaluation for bilateral ankle wounds and a medial femoral condyle wound right knee..  She is status post a left IM nailing of the left tibia and then subsequent surgery to place the interlocking screw distally.  Patient presents with ulceration over the medial right distal femur and bilateral medial malleolus.  Assessment & Plan: Visit Diagnoses:  1. Ankle wound, left, initial encounter     Plan: Patient is provided felt donut to relieve pressure from the areas of pressure.  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  Follow-Up Instructions: Return in about 2 weeks (around 06/15/2022).   Ortho Exam  Patient is alert, oriented, no adenopathy, well-dressed, normal affect, normal respiratory effort. Examination patient has a palpable dorsalis pedis pulse bilaterally.  Patient has a medial malleolus pressure ulcer bilaterally as well as a pressure ulcer over the medial femoral condyle right knee.  Imaging: No results found.     Labs: Lab Results  Component Value Date   HGBA1C 5.3 04/07/2022   HGBA1C 4.6 (L) 01/06/2022   ESRSEDRATE 25 (H) 03/15/2022   ESRSEDRATE 25 (H) 03/13/2022   ESRSEDRATE 3 02/18/2022   CRP 1.3 (H) 01/06/2022   REPTSTATUS 03/18/2022 FINAL 03/13/2022   REPTSTATUS 03/18/2022 FINAL 03/13/2022   GRAMSTAIN  01/12/2022    RARE WBC PRESENT,BOTH PMN AND MONONUCLEAR NO ORGANISMS SEEN    CULT  03/13/2022    NO GROWTH 5 DAYS Performed at [facility], [place] [address], [place], [place] [address]    CULT  03/13/2022     NO GROWTH 5 DAYS Performed at [facility] [address], [place], [place] [address]    LABORGA STAPHYLOCOCCUS AUREUS 08/25/2006     Lab Results  Component Value Date   ALBUMIN 3.2 (L) 04/21/2022   ALBUMIN 5.3 (H) 04/10/2022   ALBUMIN 5.4 (H) 04/07/2022   PREALBUMIN <5 (L) 03/23/2022    Lab Results  Component Value Date   MG 2.1 04/10/2022   MG 2.4 04/07/2022   MG 2.1 04/06/2022   Lab Results  Component Value Date   VD25OH 44.92 03/14/2022   VD25OH 19.56 (L) 01/09/2022   VD25OH 15 (L) 03/11/2011    Lab Results  Component Value Date   PREALBUMIN <5 (L) 03/23/2022      Latest Ref Rng & Units 05/05/2022    2:05 PM 04/23/2022    8:39 AM 04/23/2022    3:14 AM  CBC EXTENDED  WBC 4.0 - 10.5 K/uL   14.9   RBC 3.87 - 5.11 MIL/uL   2.45   Hemoglobin 12.0 - 15.0 g/dL 10.2  8.4  7.6   HCT 36.0 - 46.0 % 30.0  24.6  23.3   Platelets 150 - 400 K/uL   154      There is no height or weight on file to calculate BMI.  Orders:  No orders of the  defined types were placed in this encounter.  No orders of the defined types were placed in this encounter.    Procedures: No procedures performed  Clinical Data: No additional findings.  ROS:  All other systems negative, except as noted in the HPI. Review of Systems  Objective: Vital Signs: LMP 03/10/2009   Specialty Comments:  No specialty comments available.  PMFS History: Patient Active Problem List   Diagnosis Date Noted   Palliative care patient 06/02/2022   Closed fracture of left tibia and fibula 05/05/2022   Displaced comminuted fracture of shaft of left tibia, initial encounter for closed fracture 04/21/2022   Closed fracture of proximal end of left fibula, initial encounter 04/21/2022   Leukocytosis 04/21/2022   Influenza A 04/06/2022   History of gastric ulcer 03/23/2022   Abnormal loss of weight 03/23/2022   Aspiration pneumonia of both lungs ([place]) 03/23/2022   Acute on chronic diastolic CHF  (congestive heart failure) ([place]) 03/23/2022   NSVT (nonsustained ventricular tachycardia) ([place]) 03/20/2022   Localized swelling of right upper extremity 03/19/2022   Cellulitis of left lower extremity 03/17/2022   Hypophosphatemia 03/17/2022   Anemia of chronic disease 03/17/2022   Sepsis ([place]) 03/14/2022   Severe sepsis ([place]) 03/13/2022   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   Abnormal CT of the abdomen 01/19/2022   Chronic gastric ulcer with perforation ([place]) [id number]   Alcoholic cirrhosis of liver without ascites ([place]) 01/18/2022   Occult blood in stools 01/18/2022   Portal hypertensive gastropathy ([place]) 01/18/2022   Anasarca 01/12/2022   Acute respiratory failure with hypoxia ([place]) 01/11/2022   Hypoalbuminemia 01/11/2022   Pleural effusion 01/10/2022   Abdominal distention 01/10/2022   Physical deconditioning 01/09/2022   Pressure injury of skin 01/08/2022   Hypokalemia 01/07/2022   Hypomagnesemia 01/07/2022   AKI (acute kidney injury) ([place]) 01/07/2022   Severe protein-calorie malnutrition ([place]) 01/07/2022   Cellulitis of right foot 01/07/2022   Normocytic anemia 01/07/2022   Osteomyelitis ([place]) 01/06/2022   Intractable nausea and vomiting [id number]   Metabolic acidosis, increased anion gap 06/12/2020   Seizure ([place]) 05/08/2017   Chronic pain syndrome 05/08/2017   Gastroesophageal reflux disease without esophagitis 05/08/2017   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[place]) 05/08/2017   Pancreatic steatorrhea 05/08/2017   Osteoporosis 03/16/2011   Endometriosis    Malabsorption    Chronic pancreatitis ([place])    Vitamin D deficiency    Past Medical History:  Diagnosis Date   Alcoholic cirrhosis ([place])    Chronic pancreatitis ([place])    Diabetes mellitus without complication ([place])    Endometriosis    GERD (gastroesophageal reflux disease)    Intractable nausea and vomiting 06/13/2020   Liver disease    Malabsorption    MALABSORPTION SYNDROME   Osteoporosis 03/2011   t score  -2.5   Pancreatitis    due to cyst and tumors due to calcifications   Seizure ([facility])    no seizure disorder, controlled w/ meds per patient   Vitamin D deficiency 2012   VIT D 15    Family History  Problem Relation Age of Onset   Cancer Father        BONE   Breast cancer Maternal Grandmother     Past Surgical History:  Procedure Laterality Date   APPENDECTOMY     BIOPSY  01/19/2022   Procedure: BIOPSY;  Surgeon: Lavena Bullion, DO;  [facility];  Service: Gastroenterology;;   CHOLECYSTECTOMY     ESOPHAGOGASTRODUODENOSCOPY (EGD) WITH PROPOFOL N/A 01/19/2022   Procedure: ESOPHAGOGASTRODUODENOSCOPY (  EGD) WITH PROPOFOL;  Surgeon: Lavena Bullion, DO;  [facility];  Service: Gastroenterology;  Laterality: N/A;   FEEDING TUBE RELOCATION  2010   JEJUNOSTOMY FEEDING TUBE  1990   MULTIPLE GASTRIC SURGERIES     MYOMECTOMY     OPEN REDUCTION INTERNAL FIXATION (ORIF) DISTAL RADIAL FRACTURE Left 06/29/2018   Procedure: OPEN REDUCTION INTERNAL FIXATION (ORIF)LEFT  DISTAL RADIAL FRACTURE;  Surgeon: Leanora Cover, MD;  Location: [facility];  Service: Orthopedics;  Laterality: Left;   ROUX-EN-Y GASTRIC BYPASS     TIBIA IM NAIL INSERTION Left 04/21/2022   Procedure: LEFT INTRAMEDULLARY (IM) NAIL TIBIAL;  Surgeon: Marybelle Killings, MD;  Location: [facility];  Service: Orthopedics;  Laterality: Left;   TIBIA IM NAIL INSERTION Left 05/05/2022   Procedure: LEFT TIBIAL NAIL DISTAL SCREW PLACEMENT;  Surgeon: Marybelle Killings, MD;  Location: [facility];  Service: Orthopedics;  Laterality: Left;   TUBAL LIGATION     Social History   Occupational History   Not on file  Tobacco Use   Smoking status: Never   Smokeless tobacco: Never  Vaping Use   Vaping Use: Never used  Substance and Sexual Activity   Alcohol use: Yes    Comment: occ ( none in several weeks)   Drug use: No   Sexual activity: Not Currently    Partners: Male    Birth control/protection: Post-menopausal

## 2022-06-07 ENCOUNTER — Ambulatory Visit (INDEPENDENT_AMBULATORY_CARE_PROVIDER_SITE_OTHER): Payer: Medicare PPO | Admitting: Physical Therapy

## 2022-06-07 ENCOUNTER — Encounter: Payer: Self-pay | Admitting: Physical Therapy

## 2022-06-07 DIAGNOSIS — M6281 Muscle weakness (generalized): Secondary | ICD-10-CM | POA: Diagnosis not present

## 2022-06-07 DIAGNOSIS — R2689 Other abnormalities of gait and mobility: Secondary | ICD-10-CM

## 2022-06-07 DIAGNOSIS — M25562 Pain in left knee: Secondary | ICD-10-CM | POA: Diagnosis not present

## 2022-06-07 DIAGNOSIS — M25672 Stiffness of left ankle, not elsewhere classified: Secondary | ICD-10-CM

## 2022-06-07 DIAGNOSIS — M25572 Pain in left ankle and joints of left foot: Secondary | ICD-10-CM

## 2022-06-07 DIAGNOSIS — R600 Localized edema: Secondary | ICD-10-CM

## 2022-06-07 DIAGNOSIS — R2681 Unsteadiness on feet: Secondary | ICD-10-CM

## 2022-06-07 NOTE — Therapy (Signed)
OUTPATIENT PHYSICAL THERAPY LOWER EXTREMITY TREATMENT   Patient Name: Carolyn [name] MRN: [id number] DOB:[birth year], 67 y.o., female Today's Date: 06/07/2022  END OF SESSION:  PT End of Session - 06/07/22 0848     Visit Number 7    Number of Visits 30    Date for PT Re-Evaluation 08/13/22    Authorization Type [employer] PPO    Authorization Time Period $20 COPAY Approved  Authorization [id number]  Tracking [id number] PT VISITS 3/4-5/31/2024    Authorization - Visit Number 7    Authorization - Number of Visits 12    Progress Note Due on Visit 10    PT Start Time 0845    PT Stop Time 0930    PT Time Calculation (min) 45 min    Equipment Utilized During Treatment Gait belt    Activity Tolerance Patient tolerated treatment well    Behavior During Therapy WFL for tasks assessed/performed                 Past Medical History:  Diagnosis Date   Alcoholic cirrhosis ([place])    Chronic pancreatitis ([place])    Diabetes mellitus without complication ([place])    Endometriosis    GERD (gastroesophageal reflux disease)    Intractable nausea and vomiting 06/13/2020   Liver disease    Malabsorption    MALABSORPTION SYNDROME   Osteoporosis 03/2011   t score -2.5   Pancreatitis    due to cyst and tumors due to calcifications   Seizure ([place])    no seizure disorder, controlled w/ meds per patient   Vitamin D deficiency 2012   VIT D 15   Past Surgical History:  Procedure Laterality Date   APPENDECTOMY     BIOPSY  01/19/2022   Procedure: BIOPSY;  Surgeon: Lavena Bullion, DO;  Location: [facility];  Service: Gastroenterology;;   CHOLECYSTECTOMY     ESOPHAGOGASTRODUODENOSCOPY (EGD) WITH PROPOFOL N/A 01/19/2022   Procedure: ESOPHAGOGASTRODUODENOSCOPY (EGD) WITH PROPOFOL;  Surgeon: Lavena Bullion, DO;  Location: [facility];  Service: Gastroenterology;  Laterality: N/A;   FEEDING TUBE RELOCATION  2010   JEJUNOSTOMY FEEDING TUBE  1990   MULTIPLE GASTRIC  SURGERIES     MYOMECTOMY     OPEN REDUCTION INTERNAL FIXATION (ORIF) DISTAL RADIAL FRACTURE Left 06/29/2018   Procedure: OPEN REDUCTION INTERNAL FIXATION (ORIF)LEFT  DISTAL RADIAL FRACTURE;  Surgeon: Leanora Cover, MD;  Location: [facility];  Service: Orthopedics;  Laterality: Left;   ROUX-EN-Y GASTRIC BYPASS     TIBIA IM NAIL INSERTION Left 04/21/2022   Procedure: LEFT INTRAMEDULLARY (IM) NAIL TIBIAL;  Surgeon: Marybelle Killings, MD;  Location: [facility];  Service: Orthopedics;  Laterality: Left;   TIBIA IM NAIL INSERTION Left 05/05/2022   Procedure: LEFT TIBIAL NAIL DISTAL SCREW PLACEMENT;  Surgeon: Marybelle Killings, MD;  Location: [facility];  Service: Orthopedics;  Laterality: Left;   TUBAL LIGATION     Patient Active Problem List   Diagnosis Date Noted   Palliative care patient 06/02/2022   Closed fracture of left tibia and fibula 05/05/2022   Displaced comminuted fracture of shaft of left tibia, initial encounter for closed fracture 04/21/2022   Closed fracture of proximal end of left fibula, initial encounter 04/21/2022   Leukocytosis 04/21/2022   Influenza A 04/06/2022   History of gastric ulcer 03/23/2022   Abnormal loss of weight 03/23/2022   Aspiration pneumonia of both lungs ([place]) 03/23/2022   Acute on chronic diastolic CHF (congestive heart failure) ([place])  03/23/2022   NSVT (nonsustained ventricular tachycardia) ([place]) 03/20/2022   Localized swelling of right upper extremity 03/19/2022   Cellulitis of left lower extremity 03/17/2022   Hypophosphatemia 03/17/2022   Anemia of chronic disease 03/17/2022   Sepsis ([place]) 03/14/2022   Severe sepsis ([place]) 03/13/2022   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   Abnormal CT of the abdomen 01/19/2022   Chronic gastric ulcer with perforation ([place]) [id number]   Alcoholic cirrhosis of liver without ascites ([place]) 01/18/2022   Occult blood in stools 01/18/2022   Portal hypertensive gastropathy ([place]) 01/18/2022   Anasarca 01/12/2022   Acute  respiratory failure with hypoxia ([place]) 01/11/2022   Hypoalbuminemia 01/11/2022   Pleural effusion 01/10/2022   Abdominal distention 01/10/2022   Physical deconditioning 01/09/2022   Pressure injury of skin 01/08/2022   Hypokalemia 01/07/2022   Hypomagnesemia 01/07/2022   AKI (acute kidney injury) ([place]) 01/07/2022   Severe protein-calorie malnutrition ([place]) 01/07/2022   Cellulitis of right foot 01/07/2022   Normocytic anemia 01/07/2022   Osteomyelitis ([place]) 01/06/2022   Intractable nausea and vomiting [id number]   Metabolic acidosis, increased anion gap 06/12/2020   Seizure ([place]) 05/08/2017   Chronic pain syndrome 05/08/2017   Gastroesophageal reflux disease without esophagitis 05/08/2017   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[place]) 05/08/2017   Pancreatic steatorrhea 05/08/2017   Osteoporosis 03/16/2011   Endometriosis    Malabsorption    Chronic pancreatitis ([place])    Vitamin D deficiency     PCP: General PCP   REFERRING PROVIDER:  Marybelle Killings, MD   REFERRING DIAG:  S82.252A (ICD-10-CM) - Displaced comminuted fracture of shaft of left tibia, initial encounter for closed fracture  S82.832A (ICD-10-CM) - Closed fracture of proximal end of left fibula, initial encounter    THERAPY DIAG:  Muscle weakness (generalized)  Acute pain of left knee  Pain in left ankle and joints of left foot  Stiffness of left ankle, not elsewhere classified  Other abnormalities of gait and mobility  Localized edema  Unsteadiness on feet  Rationale for Evaluation and Treatment: Rehabilitation  ONSET DATE: 04/21/2022, 1st ankle sg, 05/05/2022 2nd surgery  SUBJECTIVE:   SUBJECTIVE STATEMENT: some muscle soreness for couple of days but no bone or joint pain.  She continues to use boot at home.   PERTINENT HISTORY: DM, Alcoholism in remission, chronic pancreatitis, hepatic cirrhosis, She had prolonged hospitalization 03/13/2022-1/29/204 with severe sepsis included hypoxic respiratory failure.    PAIN: NPRS scale: today left knee 4/10 & ankle 5/10 and since last PT lowest 3/10 knee 3/10 ankle & highest knee 6/10 ankle 7/10 Pain location: left knee more patella & ankle more medially Pain description: ache,  Aggravating factors:  meds wearing off, over night with increased in morning Relieving factors: meds, elevating & not moving knee / ankle  PRECAUTIONS: Fall  WEIGHT BEARING RESTRICTIONS: Yes LLE WBAT as of 05/18/2022  FALLS:  Has patient fallen in last 6 months? Yes. Number of falls 1  LIVING ENVIRONMENT: Lives with: lives with their spouse and 3 dogs 24, 24 & 50# Lives in: House single story  Stairs: Yes: External: 6 (long width)  steps; on right going up temporary ramp primary entrance Has following equipment at home: Gilford Rile - 2 wheeled, Wheelchair (manual), Shower bench, bed side commode, Grab bars, and Ramped entry  OCCUPATION: retired   PLOF: Independent  PATIENT GOALS:   get back to function in house & access community  Next MD visit:  05/18/2022  OBJECTIVE:   DIAGNOSTIC FINDINGS: 05/11/2022: Post distal tibial screw placement.  AP knee AP ankle appear to be in good rotational alignment.   PATIENT SURVEYS:  05/17/2022 / Evaluation:  FOTO intake:  34%  predicted:  47%  COGNITION: 05/17/2022 / Evaluation:   Overall cognitive status: WFL    SENSATION: 05/17/2022 / Evaluation:   WFL for knee area but unable to test ankle as wrapped for wound  EDEMA:  05/17/2022 / Evaluation:  left foot & ankle appear edematous but wrapped due to wound.  POSTURE:  05/17/2022 / Evaluation:  rounded shoulders, forward head, increased thoracic kyphosis, flexed trunk , and weight shift right  PALPATION: 05/17/2022 / Evaluation: not tested due to wrapped for wound  LOWER EXTREMITY ROM:   ROM Left Eval 05/17/22 Left 05/26/22  Hip flexion    Hip extension    Hip abduction    Hip adduction    Hip internal rotation    Hip external rotation    Knee flexion    Knee extension Seated A:  LAQ -22* Seated  A: LAQ -11*  Ankle dorsiflexion  Seated knee flexed A: -3* P: +2*   Ankle plantarflexion  P & A 19*  Ankle inversion    Ankle eversion    Ankle Not tested at evaluation due to wound with bandages.  (Blank rows = not tested)  LOWER EXTREMITY MMT:  MMT Right eval Left eval  Hip flexion    Hip extension    Hip abduction    Hip adduction    Hip internal rotation    Hip external rotation    Knee flexion  3-/5  Knee extension  3-/5  Ankle dorsiflexion    Ankle plantarflexion    Ankle inversion    Ankle eversion    Ankle Not tested at evaluation due to Toe touch Weightbearing status.  (Blank rows = not tested)   FUNCTIONAL TESTS:  05/17/2022 / Evaluation: 18 inch chair transfer:  requires armrests or RW to push up to arise.  Requires RW support for stabilization  GAIT: 05/17/2022 / Evaluation: Distance walked: 30' Assistive device utilized: Environmental consultant - 2 wheeled Level of assistance: SBA Comments: NWB LLE.  Turns with pivoting on foot incorrectly.    TODAY'S TREATMENT                                                                          DATE: 06/07/2022: PT working with left shoe instead of CAM boot in clinic only. Pt reports no change in pain  Therapeutic Exercise: Precor recumbent bike seat 6 with BLEs level 3 for 8 min Standing gastroc & soleus stretch step heel depression 30 sec 2 reps ea BLEs Standing pronation & supination stretch with lateral & medial foot on folded towel in bilateral stance with BUE support for gentle stretch 30 sec ea direction 2 reps. Pt reports no pain but feels stretch.   Neuromuscular Re-ed Rocker board with BUE support right / level / left & forward / level / backward 1 min ea. 1st set with square board 2 pivot points, 2nd set advanced to round board single pivot point. Tennis ball rolls BUE support 10 reps ea motion BLEs - ant/post, med/lat & circles.    Gait Training: Pt amb with RW 100' X 2 with shoe LLE  instead of CAM  boot with WBAT supervision. No change in pain. PT recommending not using CAM boot inside home where flat smooth surfaces and limited distances. Continue RW & WBAT.  No barefoot or slippers.  Pt verbalized understanding.    06/03/2022: PT working with left shoe instead of CAM boot in clinic only. Pt reports no change in pain  Therapeutic Exercise: NuStep seat 10 with BLEs & BUEs level 7 for 8 min Leg press BLEs 75# 15 reps 2 sets;  single leg 25# 10 reps 2 sets BLEs Sit to/from stand 24" bar stool without UE assist 10 reps.  Neuromuscular Re-ed Standing balance in //bars with PT tactile cues, mirror & verbal cues hip width stance on foam pad - head turns right/left, up/down & diagonals and static eyes closed 10 sec 3 reps Rocker board with BUE support right / level / left & forward / level / backward 1 min ea. 1st set with square board 2 pivot points, 2nd set advanced to round board single pivot point.  Gait Training: Pt amb with RW 50' X 2 with shoe LLE instead of CAM boot with WBAT supervision. No change in pain.   06/01/2022: Therapeutic Exercise: NuStep seat 10 with BLEs & BUEs level 7 for 4 min & BLEs only level 5 for 4 min Leg press BLEs 75# 15 reps 2 sets;  LLE only 25# (tried 37# & 31# but too strenous) 10 reps 2 sets   Neuromuscular Re-ed Standing balance in //bars with PT tactile cues, mirror & verbal cues hip width stance on foam pad - head turns right/left, up/down & diagonals and static eyes closed 10 sec 3 reps Rocker board with BUE support right / level / left & forward / level / backward 1 min ea.  Pt amb in / out & around clinic with RW CAM boot WBAT safely.  Due to pt report ankle wounds are weeping more. PT messaged Dr. Lorin Mercy CMA & Dr. Lorin Mercy came to PT clinic.  He recommended having Dr. Sharol Given look at them.  PT arranged for pt to see Dr. Sharol Given after PT today.    05/26/2022: Self-care: PT demo donning XL Vivewear compression sock with strip bw toes.  Recommending staying  in XL which is 2 sizes too large initially.  Wear during awake hours. Pt verbalized understanding.    Therapeutic Exercise: NuStep seat 10 with BLEs & BUEs level 7 for 4 min & BLEs only level 5 for 4 min Seated LAQ & active knee flexion with opposing knee motion 15 reps Leg press BLEs 75# 10 reps 3 sets  Neuromuscular Re-ed Standing with 1/2" under RLE to offset height of CAM boot: UE resistance to facilitate UE strength, core stabilization, WBAT BLEs and balance. Single UEs & BUEs rows, forward reach & upward reach 10 reps ea.  MinA for balance. Pt reports no increase in pain.  Pt amb in / out & around clinic with RW CAM boot WBAT safely.    PATIENT EDUCATION:  Education details: HEP, POC Person educated: Patient Education method: Consulting civil engineer, Demonstration, Verbal cues, and Handouts Education comprehension: verbalized understanding, returned demonstration, and verbal cues required  HOME EXERCISE PROGRAM: Access Code: [email or web address] URL: [email or web address] Date: 05/17/2022 Prepared by: Jamey Reas  Exercises - seated marching  - 2 x daily - 7 x weekly - 3 sets - 10 reps - 5 seconds hold - Seated Hip Abduction  - 2 x daily - 7 x weekly - 3 sets - 10 reps - 5  seconds hold - Seated Hamstring Stretch  - 2 x daily - 7 x weekly - 1 sets - 3 reps - 20-30 seconds hold - Seated Ankle Pumps  - 2 x daily - 7 x weekly - 3 sets - 10 reps - 5 seconds hold - Seated Ankle Circles  - 2 x daily - 7 x weekly - 3 sets - 10 reps  ASSESSMENT:  CLINICAL IMPRESSION: Patient appears safe for gait with RW with shoe for short distances flat surfaces like home.  PT introduced ankle stretches standing today which did not cause pain issues.   Patient continues to benefit from skilled PT.  OBJECTIVE IMPAIRMENTS: Abnormal gait, cardiopulmonary status limiting activity, decreased activity tolerance, decreased balance, decreased coordination, decreased endurance, decreased knowledge of condition,  decreased knowledge of use of DME, decreased mobility, difficulty walking, decreased ROM, decreased strength, increased edema, increased muscle spasms, impaired flexibility, impaired sensation, postural dysfunction, and pain.   ACTIVITY LIMITATIONS: carrying, lifting, bending, standing, squatting, sleeping, stairs, transfers, reach over head, and locomotion level  PARTICIPATION LIMITATIONS: meal prep, cleaning, driving, and community activity  PERSONAL FACTORS: Fitness, Time since onset of injury/illness/exacerbation, and 3+ comorbidities: see PMH  are also affecting patient's functional outcome.   REHAB POTENTIAL: Good  CLINICAL DECISION MAKING: Stable/uncomplicated  EVALUATION COMPLEXITY: Low   GOALS: Goals reviewed with patient? Yes  SHORT TERM GOALS: (target date for Short term goals are 30 days 06/16/2022)   1.  Patient will demonstrate independent use of home exercise program to maintain progress from in clinic treatments.  Goal status: Ongoing 06/01/2022  2. Patient amb 52' with RW with TDWBing LLE (or higher if MD permits) with supervision.  Goal Status: MET 05/26/2022  LONG TERM GOALS: (target dates for all long term goals are 90 days 08/13/2022)   1. Patient will demonstrate/report pain at worst less than or equal to 2/10 to facilitate minimal limitation in daily activity secondary to pain symptoms. Goal status: New   2. Patient will demonstrate independent use of home exercise program to facilitate ability to maintain/progress functional gains from skilled physical therapy services. Goal status: New   3. Patient will demonstrate FOTO outcome > or = 47 % to indicate reduced disability due to condition.  Goal status: New   4.  Patient will demonstrate LLE MMT 5/5 throughout to faciltiate usual transfers, stairs, squatting at [facility] for daily life.   Goal status: New   5.  Patient ambulates >500' with LRAD and neg ramp, curb & stairs single rail modified independent.   Goal status: New   6.  AROM of Left knee WFL & left ankle within 75% of right ankle Goal status: New   PLAN:  PT FREQUENCY: 2-3 x/week  PT DURATION: 13 weeks (90 days)  PLANNED INTERVENTIONS: Therapeutic exercises, Therapeutic activity, Neuro Muscular re-education, Balance training, Gait training, Patient/Family education, Joint mobilization, Stair training, DME instructions, Dry Needling, Electrical stimulation, Traction, Cryotherapy, vasopneumatic deviceMoist heat, Taping, Ultrasound, Ionotophoresis 4mg /ml Dexamethasone, and Manual therapy.  All included unless contraindicated  PLAN FOR NEXT SESSION:   check on gait with shoe / RW WBAT in home, add standing stretches to HEP if no pain,  Cont with there exer & therapeutic activities to tolerance including balance activities   Jamey Reas, PT, DPT 06/07/2022, 1:08 PM

## 2022-06-09 ENCOUNTER — Ambulatory Visit (INDEPENDENT_AMBULATORY_CARE_PROVIDER_SITE_OTHER): Payer: Medicare PPO | Admitting: Physical Therapy

## 2022-06-09 ENCOUNTER — Encounter: Payer: Self-pay | Admitting: Physical Therapy

## 2022-06-09 DIAGNOSIS — M25562 Pain in left knee: Secondary | ICD-10-CM

## 2022-06-09 DIAGNOSIS — M25572 Pain in left ankle and joints of left foot: Secondary | ICD-10-CM | POA: Diagnosis not present

## 2022-06-09 DIAGNOSIS — R2689 Other abnormalities of gait and mobility: Secondary | ICD-10-CM

## 2022-06-09 DIAGNOSIS — M25672 Stiffness of left ankle, not elsewhere classified: Secondary | ICD-10-CM

## 2022-06-09 DIAGNOSIS — M6281 Muscle weakness (generalized): Secondary | ICD-10-CM | POA: Diagnosis not present

## 2022-06-09 DIAGNOSIS — R2681 Unsteadiness on feet: Secondary | ICD-10-CM

## 2022-06-09 DIAGNOSIS — R600 Localized edema: Secondary | ICD-10-CM

## 2022-06-09 NOTE — Therapy (Signed)
OUTPATIENT PHYSICAL THERAPY LOWER EXTREMITY TREATMENT   Patient Name: Carolyn [name] MRN: [id number] DOB:[birth year], 67 y.o., female Today's Date: 06/09/2022  END OF SESSION:  PT End of Session - 06/09/22 0849     Visit Number 8    Number of Visits 30    Date for PT Re-Evaluation 08/13/22    Authorization Type [employer] PPO    Authorization Time Period $20 COPAY Approved  Authorization [id number]  Tracking [id number] PT VISITS 3/4-5/31/2024    Authorization - Visit Number 8    Authorization - Number of Visits 12    Progress Note Due on Visit 10    PT Start Time [phone]    PT Stop Time 0930    PT Time Calculation (min) 48 min    Equipment Utilized During Treatment Gait belt    Activity Tolerance Patient tolerated treatment well    Behavior During Therapy WFL for tasks assessed/performed                  Past Medical History:  Diagnosis Date   Alcoholic cirrhosis ([place])    Chronic pancreatitis ([place])    Diabetes mellitus without complication ([place])    Endometriosis    GERD (gastroesophageal reflux disease)    Intractable nausea and vomiting 06/13/2020   Liver disease    Malabsorption    MALABSORPTION SYNDROME   Osteoporosis 03/2011   t score -2.5   Pancreatitis    due to cyst and tumors due to calcifications   Seizure ([place])    no seizure disorder, controlled w/ meds per patient   Vitamin D deficiency 2012   VIT D 15   Past Surgical History:  Procedure Laterality Date   APPENDECTOMY     BIOPSY  01/19/2022   Procedure: BIOPSY;  Surgeon: Lavena Bullion, DO;  Location: [facility];  Service: Gastroenterology;;   CHOLECYSTECTOMY     ESOPHAGOGASTRODUODENOSCOPY (EGD) WITH PROPOFOL N/A 01/19/2022   Procedure: ESOPHAGOGASTRODUODENOSCOPY (EGD) WITH PROPOFOL;  Surgeon: Lavena Bullion, DO;  Location: [facility];  Service: Gastroenterology;  Laterality: N/A;   FEEDING TUBE RELOCATION  2010   JEJUNOSTOMY FEEDING TUBE  1990   MULTIPLE GASTRIC  SURGERIES     MYOMECTOMY     OPEN REDUCTION INTERNAL FIXATION (ORIF) DISTAL RADIAL FRACTURE Left 06/29/2018   Procedure: OPEN REDUCTION INTERNAL FIXATION (ORIF)LEFT  DISTAL RADIAL FRACTURE;  Surgeon: Leanora Cover, MD;  Location: [facility];  Service: Orthopedics;  Laterality: Left;   ROUX-EN-Y GASTRIC BYPASS     TIBIA IM NAIL INSERTION Left 04/21/2022   Procedure: LEFT INTRAMEDULLARY (IM) NAIL TIBIAL;  Surgeon: Marybelle Killings, MD;  Location: [facility];  Service: Orthopedics;  Laterality: Left;   TIBIA IM NAIL INSERTION Left 05/05/2022   Procedure: LEFT TIBIAL NAIL DISTAL SCREW PLACEMENT;  Surgeon: Marybelle Killings, MD;  Location: [facility];  Service: Orthopedics;  Laterality: Left;   TUBAL LIGATION     Patient Active Problem List   Diagnosis Date Noted   Palliative care patient 06/02/2022   Closed fracture of left tibia and fibula 05/05/2022   Displaced comminuted fracture of shaft of left tibia, initial encounter for closed fracture 04/21/2022   Closed fracture of proximal end of left fibula, initial encounter 04/21/2022   Leukocytosis 04/21/2022   Influenza A 04/06/2022   History of gastric ulcer 03/23/2022   Abnormal loss of weight 03/23/2022   Aspiration pneumonia of both lungs ([place]) 03/23/2022   Acute on chronic diastolic CHF (congestive heart failure) (  [place]) 03/23/2022   NSVT (nonsustained ventricular tachycardia) ([place]) 03/20/2022   Localized swelling of right upper extremity 03/19/2022   Cellulitis of left lower extremity 03/17/2022   Hypophosphatemia 03/17/2022   Anemia of chronic disease 03/17/2022   Sepsis ([place]) 03/14/2022   Severe sepsis ([place]) 03/13/2022   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   Abnormal CT of the abdomen 01/19/2022   Chronic gastric ulcer with perforation ([place]) [id number]   Alcoholic cirrhosis of liver without ascites ([place]) 01/18/2022   Occult blood in stools 01/18/2022   Portal hypertensive gastropathy ([place]) 01/18/2022   Anasarca 01/12/2022   Acute  respiratory failure with hypoxia ([place]) 01/11/2022   Hypoalbuminemia 01/11/2022   Pleural effusion 01/10/2022   Abdominal distention 01/10/2022   Physical deconditioning 01/09/2022   Pressure injury of skin 01/08/2022   Hypokalemia 01/07/2022   Hypomagnesemia 01/07/2022   AKI (acute kidney injury) ([place]) 01/07/2022   Severe protein-calorie malnutrition ([place]) 01/07/2022   Cellulitis of right foot 01/07/2022   Normocytic anemia 01/07/2022   Osteomyelitis ([place]) 01/06/2022   Intractable nausea and vomiting [id number]   Metabolic acidosis, increased anion gap 06/12/2020   Seizure ([place]) 05/08/2017   Chronic pain syndrome 05/08/2017   Gastroesophageal reflux disease without esophagitis 05/08/2017   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[place]) 05/08/2017   Pancreatic steatorrhea 05/08/2017   Osteoporosis 03/16/2011   Endometriosis    Malabsorption    Chronic pancreatitis ([place])    Vitamin D deficiency     PCP: General PCP   REFERRING PROVIDER:  Marybelle Killings, MD   REFERRING DIAG:  S82.252A (ICD-10-CM) - Displaced comminuted fracture of shaft of left tibia, initial encounter for closed fracture  S82.832A (ICD-10-CM) - Closed fracture of proximal end of left fibula, initial encounter    THERAPY DIAG:  Muscle weakness (generalized)  Acute pain of left knee  Pain in left ankle and joints of left foot  Stiffness of left ankle, not elsewhere classified  Other abnormalities of gait and mobility  Localized edema  Unsteadiness on feet  Rationale for Evaluation and Treatment: Rehabilitation  ONSET DATE: 04/21/2022, 1st ankle sg, 05/05/2022 2nd surgery  SUBJECTIVE:   SUBJECTIVE STATEMENT:   She used shoe in house with RW Monday afternoon but had gone shopping 2 places using CAM boot / RW.  She woke up ~3am with ankle pain that medication relieved.  The pain did not return since Tuesday ~3am even when meds wore off.  She used CAM boot in home Tuesday until her PT appt on Wed.   PERTINENT  HISTORY: DM, Alcoholism in remission, chronic pancreatitis, hepatic cirrhosis, She had prolonged hospitalization 03/13/2022-1/29/204 with severe sepsis included hypoxic respiratory failure.   PAIN: NPRS scale: today left knee 3/10 & ankle 3/10 and since last PT lowest knee 3/10, ankle 3/10  & highest knee 6-7/10 ankle 6-7/10 Pain location: left knee more patella & ankle more medially Pain description: ache,  Aggravating factors:  meds wearing off, over night with increased in morning Relieving factors: meds, elevating & not moving knee / ankle  PRECAUTIONS: Fall  WEIGHT BEARING RESTRICTIONS: Yes LLE WBAT as of 05/18/2022  FALLS:  Has patient fallen in last 6 months? Yes. Number of falls 1  LIVING ENVIRONMENT: Lives with: lives with their spouse and 3 dogs 39, 76 & 50# Lives in: House single story  Stairs: Yes: External: 6 (long width)  steps; on right going up temporary ramp primary entrance Has following equipment at home: Gilford Rile - 2 wheeled, Wheelchair (manual), Shower bench, bed side commode, Grab bars, and  Ramped entry  OCCUPATION: retired   PLOF: Independent  PATIENT GOALS:   get back to function in house & access community  Next MD visit:  05/18/2022  OBJECTIVE:   DIAGNOSTIC FINDINGS: 05/11/2022: Post distal tibial screw placement.  AP knee AP ankle appear to be in good rotational alignment.   PATIENT SURVEYS:  05/17/2022 / Evaluation:  FOTO intake:  34%  predicted:  47%  COGNITION: 05/17/2022 / Evaluation:   Overall cognitive status: WFL    SENSATION: 05/17/2022 / Evaluation:   WFL for knee area but unable to test ankle as wrapped for wound  EDEMA:  05/17/2022 / Evaluation:  left foot & ankle appear edematous but wrapped due to wound.  POSTURE:  05/17/2022 / Evaluation:  rounded shoulders, forward head, increased thoracic kyphosis, flexed trunk , and weight shift right  PALPATION: 05/17/2022 / Evaluation: not tested due to wrapped for wound  LOWER EXTREMITY ROM:   ROM  Left Eval 05/17/22 Left 05/26/22  Hip flexion    Hip extension    Hip abduction    Hip adduction    Hip internal rotation    Hip external rotation    Knee flexion    Knee extension Seated A: LAQ -22* Seated  A: LAQ -11*  Ankle dorsiflexion  Seated knee flexed A: -3* P: +2*   Ankle plantarflexion  P & A 19*  Ankle inversion    Ankle eversion    Ankle Not tested at evaluation due to wound with bandages.  (Blank rows = not tested)  LOWER EXTREMITY MMT:  MMT Right eval Left eval  Hip flexion    Hip extension    Hip abduction    Hip adduction    Hip internal rotation    Hip external rotation    Knee flexion  3-/5  Knee extension  3-/5  Ankle dorsiflexion    Ankle plantarflexion    Ankle inversion    Ankle eversion    Ankle Not tested at evaluation due to Toe touch Weightbearing status.  (Blank rows = not tested)   FUNCTIONAL TESTS:  05/17/2022 / Evaluation: 18 inch chair transfer:  requires armrests or RW to push up to arise.  Requires RW support for stabilization  GAIT: 05/17/2022 / Evaluation: Distance walked: 30' Assistive device utilized: Environmental consultant - 2 wheeled Level of assistance: SBA Comments: NWB LLE.  Turns with pivoting on foot incorrectly.    TODAY'S TREATMENT                                                                          DATE: 06/09/2022: Since pain is same except that one middle of night pain, PT thinks it was from increasing distance with CAM boot too rapidly.  PT working with left shoe instead of CAM boot in clinic only. Pt reports no change in pain  Therapeutic Exercise: Precor recumbent bike seat 6 with BLEs level 3 for 8 min Leg press BLEs 81# 15 reps 1 sets 10 reps 2nd set reps limited by LE fatigue;  single leg 25# 10 reps 2 sets BLEs (PT  Standing gastroc & soleus stretch step heel depression 30 sec 2 reps ea BLEs Standing pronation & supination stretch with lateral & medial foot on  folded towel in bilateral stance with BUE support for  gentle stretch 30 sec ea direction 2 reps. Pt reports no pain but feels stretch. PT updated HEP with demo, verbal & HO cues. She performed 1 set of 10 reps of strengthening.  -Seated Leg Press with blue Resistance  - 1 x daily - 7 x weekly - 2 sets - 10 reps - 5 seconds hold - Seated Ankle Plantarflexion with red Resistance  - 1 x daily - 7 x weekly - 2 sets - 10 reps - 5 seconds hold - Ankle Dorsiflexion with red Resistance  - 1 x daily - 7 x weekly - 2 sets - 10 reps - 5 seconds hold - Seated Figure 4 Ankle Inversion with red Resistance  - 1 x daily - 7 x weekly - 2 sets - 10 reps - 5 seconds hold - Seated Ankle Eversion with Anchored red Resistance  - 1 x daily - 7 x weekly - 1 sets - 10 reps - 5 seconds hold - Standing Gastroc Stretch on Step  - 1 x daily - 7 x weekly - 1 sets - 2-3 reps - 30 seconds hold - Standing Soleus Stretch on Step  - 1 x daily - 7 x weekly - 1 sets - 2-3 reps - 30 seconds hold  Gait Training: Pt amb with RW 100' X 2 with shoe LLE instead of CAM boot with WBAT supervision. No change in pain. PT recommending to continue not using CAM boot inside home where flat smooth surfaces and limited distances. Continue RW & WBAT.  No barefoot or slippers.  Pt verbalized understanding.    06/07/2022: PT working with left shoe instead of CAM boot in clinic only. Pt reports no change in pain  Therapeutic Exercise: Precor recumbent bike seat 6 with BLEs level 3 for 8 min Standing gastroc & soleus stretch step heel depression 30 sec 2 reps ea BLEs Standing pronation & supination stretch with lateral & medial foot on folded towel in bilateral stance with BUE support for gentle stretch 30 sec ea direction 2 reps. Pt reports no pain but feels stretch.   Neuromuscular Re-ed Rocker board with BUE support right / level / left & forward / level / backward 1 min ea. 1st set with square board 2 pivot points, 2nd set advanced to round board single pivot point. Tennis ball rolls BUE support  10 reps ea motion BLEs - ant/post, med/lat & circles.    Gait Training: Pt amb with RW 100' X 2 with shoe LLE instead of CAM boot with WBAT supervision. No change in pain. PT recommending not using CAM boot inside home where flat smooth surfaces and limited distances. Continue RW & WBAT.  No barefoot or slippers.  Pt verbalized understanding.    06/03/2022: PT working with left shoe instead of CAM boot in clinic only. Pt reports no change in pain  Therapeutic Exercise: NuStep seat 10 with BLEs & BUEs level 7 for 8 min Leg press BLEs 75# 15 reps 2 sets;  single leg 25# 10 reps 2 sets BLEs Sit to/from stand 24" bar stool without UE assist 10 reps.  Neuromuscular Re-ed Standing balance in //bars with PT tactile cues, mirror & verbal cues hip width stance on foam pad - head turns right/left, up/down & diagonals and static eyes closed 10 sec 3 reps Rocker board with BUE support right / level / left & forward / level / backward 1 min ea. 1st set with square board  2 pivot points, 2nd set advanced to round board single pivot point.  Gait Training: Pt amb with RW 50' X 2 with shoe LLE instead of CAM boot with WBAT supervision. No change in pain.   PATIENT EDUCATION:  Education details: HEP, POC Person educated: Patient Education method: Consulting civil engineer, Demonstration, Verbal cues, and Handouts Education comprehension: verbalized understanding, returned demonstration, and verbal cues required  HOME EXERCISE PROGRAM: Access Code: [email or web address] URL: [email or web address] Date: 06/09/2022 Prepared by: Jamey Reas  Exercises - seated marching  - 2 x daily - 7 x weekly - 3 sets - 10 reps - 5 seconds hold - Seated Hip Abduction  - 2 x daily - 7 x weekly - 3 sets - 10 reps - 5 seconds hold - Seated Hamstring Stretch  - 2 x daily - 7 x weekly - 1 sets - 3 reps - 20-30 seconds hold - Seated Ankle Pumps  - 2 x daily - 7 x weekly - 3 sets - 10 reps - 5 seconds hold - Seated Ankle Circles  -  2 x daily - 7 x weekly - 3 sets - 10 reps - Seated Leg Press with Resistance  - 1 x daily - 7 x weekly - 2 sets - 10 reps - 5 seconds hold - Seated Ankle Plantarflexion with Resistance  - 1 x daily - 7 x weekly - 2 sets - 10 reps - 5 seconds hold - Ankle Dorsiflexion with Resistance  - 1 x daily - 7 x weekly - 2 sets - 10 reps - 5 seconds hold - Seated Figure 4 Ankle Inversion with Resistance  - 1 x daily - 7 x weekly - 2 sets - 10 reps - 5 seconds hold - Seated Ankle Eversion with Anchored Resistance  - 1 x daily - 7 x weekly - 1 sets - 10 reps - 5 seconds hold - Standing Gastroc Stretch on Step  - 1 x daily - 7 x weekly - 1 sets - 2-3 reps - 30 seconds hold - Standing Soleus Stretch on Step  - 1 x daily - 7 x weekly - 1 sets - 2-3 reps - 30 seconds hold  ASSESSMENT:  CLINICAL IMPRESSION: Patient's night pain was resolved back to baseline by morning.  PT updated HEP to include theraband & stretches which she appears to understand.   Patient continues to benefit from skilled PT.  OBJECTIVE IMPAIRMENTS: Abnormal gait, cardiopulmonary status limiting activity, decreased activity tolerance, decreased balance, decreased coordination, decreased endurance, decreased knowledge of condition, decreased knowledge of use of DME, decreased mobility, difficulty walking, decreased ROM, decreased strength, increased edema, increased muscle spasms, impaired flexibility, impaired sensation, postural dysfunction, and pain.   ACTIVITY LIMITATIONS: carrying, lifting, bending, standing, squatting, sleeping, stairs, transfers, reach over head, and locomotion level  PARTICIPATION LIMITATIONS: meal prep, cleaning, driving, and community activity  PERSONAL FACTORS: Fitness, Time since onset of injury/illness/exacerbation, and 3+ comorbidities: see PMH  are also affecting patient's functional outcome.   REHAB POTENTIAL: Good  CLINICAL DECISION MAKING: Stable/uncomplicated  EVALUATION COMPLEXITY:  Low   GOALS: Goals reviewed with patient? Yes  SHORT TERM GOALS: (target date for Short term goals are 30 days 06/16/2022)   1.  Patient will demonstrate independent use of home exercise program to maintain progress from in clinic treatments.  Goal status: Ongoing 06/01/2022  2. Patient amb 38' with RW with TDWBing LLE (or higher if MD permits) with supervision.  Goal Status: MET 05/26/2022  LONG TERM GOALS: (target dates for all long  term goals are 90 days 08/13/2022)   1. Patient will demonstrate/report pain at worst less than or equal to 2/10 to facilitate minimal limitation in daily activity secondary to pain symptoms. Goal status: New   2. Patient will demonstrate independent use of home exercise program to facilitate ability to maintain/progress functional gains from skilled physical therapy services. Goal status: New   3. Patient will demonstrate FOTO outcome > or = 47 % to indicate reduced disability due to condition.  Goal status: New   4.  Patient will demonstrate LLE MMT 5/5 throughout to faciltiate usual transfers, stairs, squatting at [facility] for daily life.   Goal status: New   5.  Patient ambulates >500' with LRAD and neg ramp, curb & stairs single rail modified independent.  Goal status: New   6.  AROM of Left knee WFL & left ankle within 75% of right ankle Goal status: New   PLAN:  PT FREQUENCY: 2-3 x/week  PT DURATION: 13 weeks (90 days)  PLANNED INTERVENTIONS: Therapeutic exercises, Therapeutic activity, Neuro Muscular re-education, Balance training, Gait training, Patient/Family education, Joint mobilization, Stair training, DME instructions, Dry Needling, Electrical stimulation, Traction, Cryotherapy, vasopneumatic deviceMoist heat, Taping, Ultrasound, Ionotophoresis 4mg /ml Dexamethasone, and Manual therapy.  All included unless contraindicated  PLAN FOR NEXT SESSION:   check STGs next week, check on gait with shoe / RW WBAT in home, check on updated HEP,  Cont with there exer & therapeutic activities to tolerance including balance activities   Jamey Reas, PT, DPT 06/09/2022, 3:45 PM

## 2022-06-11 ENCOUNTER — Other Ambulatory Visit (HOSPITAL_COMMUNITY): Payer: Self-pay

## 2022-06-11 ENCOUNTER — Encounter (HOSPITAL_COMMUNITY): Payer: Self-pay

## 2022-06-11 ENCOUNTER — Ambulatory Visit (HOSPITAL_COMMUNITY)
Admission: EM | Admit: 2022-06-11 | Discharge: 2022-06-11 | Disposition: A | Discharge status: Home / Self Care | Payer: Medicare PPO | Attending: Nurse Practitioner | Admitting: Nurse Practitioner

## 2022-06-11 DIAGNOSIS — T148XXA Other injury of unspecified body region, initial encounter: Secondary | ICD-10-CM | POA: Diagnosis not present

## 2022-06-11 DIAGNOSIS — L03115 Cellulitis of right lower limb: Secondary | ICD-10-CM | POA: Diagnosis not present

## 2022-06-11 MED ORDER — CEPHALEXIN 500 MG PO CAPS
500.0000 mg | ORAL_CAPSULE | Freq: Four times a day (QID) | ORAL | 0 refills | Status: AC
  Filled 2022-06-11: qty 20, 5d supply, fill #0

## 2022-06-11 NOTE — ED Triage Notes (Signed)
Pt states that her right calf is swollen,foot swollen. Painful to the touch and to walk. X1 week. Taking advil for the pain.    She has an open wound on the inside of her right ankle. Foot is swollen and red up the leg.

## 2022-06-11 NOTE — ED Provider Notes (Signed)
[place]    CSN: [id number] Arrival date & time: 06/11/22  1408      History   Chief Complaint Chief Complaint  Patient presents with   Leg Pain   Foot Pain    HPI Carolyn STUEDEMANN is a 67 y.o. female.   Patient presents today for right lower extremity swelling, redness, and tenderness to touch.  Reports she has an open wound that is a chronic nonhealing wound on her right ankle and she is managing it with offloading and topical bacitracin.  Reports she is concerned that she is developing infection in the ankle/leg.  No history of new trauma to the area, oozing, pus, fever, nausea/vomiting.  Reports appetite is normal and she has been trying to eat more and is pushing protein in her diet.  She reports area is tender, red, and warm and she thinks the redness is spreading.    Past Medical History:  Diagnosis Date   Alcoholic cirrhosis ([place])    Chronic pancreatitis ([place])    Diabetes mellitus without complication ([place])    Endometriosis    GERD (gastroesophageal reflux disease)    Intractable nausea and vomiting 06/13/2020   Liver disease    Malabsorption    MALABSORPTION SYNDROME   Osteoporosis 03/2011   t score -2.5   Pancreatitis    due to cyst and tumors due to calcifications   Seizure ([place])    no seizure disorder, controlled w/ meds per patient   Vitamin D deficiency 2012   VIT D 15    Patient Active Problem List   Diagnosis Date Noted   Palliative care patient 06/02/2022   Closed fracture of left tibia and fibula 05/05/2022   Displaced comminuted fracture of shaft of left tibia, initial encounter for closed fracture 04/21/2022   Closed fracture of proximal end of left fibula, initial encounter 04/21/2022   Leukocytosis 04/21/2022   Influenza A 04/06/2022   History of gastric ulcer 03/23/2022   Abnormal loss of weight 03/23/2022   Aspiration pneumonia of both lungs ([place]) 03/23/2022   Acute on chronic diastolic CHF (congestive heart failure) ([place])  03/23/2022   NSVT (nonsustained ventricular tachycardia) ([place]) 03/20/2022   Localized swelling of right upper extremity 03/19/2022   Cellulitis of left lower extremity 03/17/2022   Hypophosphatemia 03/17/2022   Anemia of chronic disease 03/17/2022   Sepsis ([place]) 03/14/2022   Severe sepsis ([place]) 03/13/2022   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   Abnormal CT of the abdomen 01/19/2022   Chronic gastric ulcer with perforation ([place]) [id number]   Alcoholic cirrhosis of liver without ascites ([place]) 01/18/2022   Occult blood in stools 01/18/2022   Portal hypertensive gastropathy ([place]) 01/18/2022   Anasarca 01/12/2022   Acute respiratory failure with hypoxia ([place]) 01/11/2022   Hypoalbuminemia 01/11/2022   Pleural effusion 01/10/2022   Abdominal distention 01/10/2022   Physical deconditioning 01/09/2022   Pressure injury of skin 01/08/2022   Hypokalemia 01/07/2022   Hypomagnesemia 01/07/2022   AKI (acute kidney injury) ([place]) 01/07/2022   Severe protein-calorie malnutrition ([place]) 01/07/2022   Cellulitis of right foot 01/07/2022   Normocytic anemia 01/07/2022   Osteomyelitis ([place]) 01/06/2022   Intractable nausea and vomiting [id number]   Metabolic acidosis, increased anion gap 06/12/2020   Seizure ([place]) 05/08/2017   Chronic pain syndrome 05/08/2017   Gastroesophageal reflux disease without esophagitis 05/08/2017   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[place]) 05/08/2017   Pancreatic steatorrhea 05/08/2017   Osteoporosis 03/16/2011   Endometriosis    Malabsorption    Chronic pancreatitis (  [place])    Vitamin D deficiency     Past Surgical History:  Procedure Laterality Date   APPENDECTOMY     BIOPSY  01/19/2022   Procedure: BIOPSY;  Surgeon: Lavena Bullion, DO;  Location: [facility];  Service: Gastroenterology;;   CHOLECYSTECTOMY     ESOPHAGOGASTRODUODENOSCOPY (EGD) WITH PROPOFOL N/A 01/19/2022   Procedure: ESOPHAGOGASTRODUODENOSCOPY (EGD) WITH PROPOFOL;  Surgeon: Lavena Bullion, DO;   Location: [facility];  Service: Gastroenterology;  Laterality: N/A;   FEEDING TUBE RELOCATION  2010   JEJUNOSTOMY FEEDING TUBE  1990   MULTIPLE GASTRIC SURGERIES     MYOMECTOMY     OPEN REDUCTION INTERNAL FIXATION (ORIF) DISTAL RADIAL FRACTURE Left 06/29/2018   Procedure: OPEN REDUCTION INTERNAL FIXATION (ORIF)LEFT  DISTAL RADIAL FRACTURE;  Surgeon: Leanora Cover, MD;  Location: [facility];  Service: Orthopedics;  Laterality: Left;   ROUX-EN-Y GASTRIC BYPASS     TIBIA IM NAIL INSERTION Left 04/21/2022   Procedure: LEFT INTRAMEDULLARY (IM) NAIL TIBIAL;  Surgeon: Marybelle Killings, MD;  Location: [facility];  Service: Orthopedics;  Laterality: Left;   TIBIA IM NAIL INSERTION Left 05/05/2022   Procedure: LEFT TIBIAL NAIL DISTAL SCREW PLACEMENT;  Surgeon: Marybelle Killings, MD;  Location: [facility];  Service: Orthopedics;  Laterality: Left;   TUBAL LIGATION      OB History     Gravida  0   Para      Term      Preterm      AB      Living         SAB      IAB      Ectopic      Multiple      Live Births               Home Medications    Prior to Admission medications   Medication Sig Start Date End Date Taking? Authorizing Provider  cholecalciferol (CHOLECALCIFEROL) 25 MCG tablet Take 1 tablet (1,000 Units total) by mouth daily. 04/13/22  Yes Georgette Shell, MD  famotidine (PEPCID) 10 MG tablet Take 10 mg by mouth 2 (two) times daily.   Yes [provider]  fentaNYL (DURAGESIC) 50 MCG/HR Place 1 patch onto the skin every 3 (three) days. 06/02/22 07/02/22 Yes Acquanetta Chain, DO  furosemide (LASIX) 20 MG tablet Take 1 tablet (20 mg total) by mouth daily. 06/02/22  Yes Acquanetta Chain, DO  gabapentin (NEURONTIN) 300 MG capsule Take 1 capsule (300 mg total) by mouth 2 (two) times daily. 06/02/22  Yes Lane Hacker L, DO  HYDROmorphone (DILAUDID) 2 MG tablet Take 1 tablet (2 mg total) by mouth every 12 (twelve) hours as needed for severe pain  ([facility]). 06/04/22  Yes Acquanetta Chain, DO  lipase/protease/amylase (CREON) 12000-38000 units CPEP capsule Take 2 capsules (24,000 Units total) by mouth 3 (three) times daily with meals. 06/02/22  Yes Lane Hacker L, DO  metoprolol tartrate (LOPRESSOR) 25 MG tablet Take 0.5 tablets (12.5 mg total) by mouth 2 (two) times daily. 06/02/22 07/02/22 Yes Acquanetta Chain, DO  Nutritional Supplements (,FEEDING SUPPLEMENT, PROSOURCE PLUS) liquid Take 30 mLs by mouth 2 (two) times daily between meals. 04/12/22  Yes Georgette Shell, MD  pyridOXINE (B-6) 100 MG tablet Take 1 tablet (100 mg total) by mouth daily. 04/13/22  Yes Georgette Shell, MD  acetaminophen (TYLENOL) 325 MG tablet Take 2 tablets (650 mg total) by mouth every 4 (four) hours as  needed for mild pain (temp > 101.5). 04/12/22   Georgette Shell, MD  cephALEXin (KEFLEX) 500 MG capsule Take 1 capsule (500 mg total) by mouth 4 (four) times daily for 5 days. 06/11/22 06/16/22 Yes Eulogio Bear, NP  feeding supplement (ENSURE ENLIVE / ENSURE PLUS) LIQD Take 237 mLs by mouth 3 (three) times daily between meals. 04/12/22   Georgette Shell, MD  levothyroxine (SYNTHROID) 50 MCG tablet Take 1 tablet (50 mcg total) by mouth daily at 6 (six) AM. 06/02/22   Acquanetta Chain, DO  liothyronine (CYTOMEL) 25 MCG tablet Take 1 tablet (25 mcg total) by mouth daily. 06/02/22 07/02/22  Acquanetta Chain, DO  loperamide (IMODIUM) 2 MG capsule Take 1 capsule (2 mg total) by mouth every 6 (six) hours as needed for diarrhea or loose stools. 04/12/22   Georgette Shell, MD  Melatonin 5 MG CAPS Take 1 capsule by mouth at bedtime.    [provider]  Multiple Vitamin (MULITIVITAMIN WITH MINERALS) TABS Take 1 tablet by mouth daily.    [provider]  pantoprazole (PROTONIX) 40 MG tablet Take 1 tablet (40 mg total) by mouth 2 (two) times daily before a meal. 02/11/22   Esterwood, Amy S, PA-C  polyethylene glycol  (MIRALAX / GLYCOLAX) 17 g packet Take 17 g by mouth 2 (two) times daily as needed for mild constipation. 01/25/22   Alma Friendly, MD  saccharomyces boulardii (FLORASTOR) 250 MG capsule Take 1 capsule (250 mg total) by mouth 2 (two) times daily. 04/12/22   Georgette Shell, MD  selenium 100 MCG TABS Take 1 tablet (100 mcg total) by mouth daily. 04/13/22   Georgette Shell, MD  silver sulfADIAZINE (SILVADENE) 1 % cream Apply topically daily. Patient taking differently: Apply 1 Application topically daily. 04/13/22   Georgette Shell, MD  vitamin A 3 MG (10000 UNITS) capsule Take 1 capsule (10,000 Units total) by mouth every other day. 04/13/22   Georgette Shell, MD  vitamin B-12 (CYANOCOBALAMIN) 100 MCG tablet Take 100 mcg by mouth daily.    [provider]  vitamin E 180 MG (400 UNITS) capsule Take 1 capsule (400 Units total) by mouth daily. 04/13/22   Georgette Shell, MD  zinc sulfate 220 (50 Zn) MG capsule Take 1 capsule (220 mg total) by mouth daily. 01/09/22   Mercy Riding, MD  PROMETHAZINE HCL PO Take 4 mg by mouth.   06/06/11  [provider]  Ranitidine HCl (ZANTAC PO) Take by mouth.    06/06/11  [provider]    Family History Family History  Problem Relation Age of Onset   Cancer Father        BONE   Breast cancer Maternal Grandmother     Social History Social History   Tobacco Use   Smoking status: Never   Smokeless tobacco: Never  Vaping Use   Vaping Use: Never used  Substance Use Topics   Alcohol use: Yes    Comment: occ ( none in several weeks)   Drug use: No     Allergies   Other, Peanut-containing drug products, and Pecan extract   Review of Systems Review of Systems Per HPI  Physical Exam Triage Vital Signs ED Triage Vitals  Enc Vitals Group     BP 06/11/22 1534 132/76     Pulse Rate 06/11/22 1534 86     Resp 06/11/22 1534 16     Temp 06/11/22 1534 97.7 F (36.5  C)     Temp Source 06/11/22 1534  Oral     SpO2 06/11/22 1534 96 %     Weight 06/11/22 1528 105 lb (47.6 kg)     Height --      Head Circumference --      Peak Flow --      Pain Score 06/11/22 1528 5     Pain Loc --      Pain Edu? --      Excl. in [place]? --    No data found.  Updated Vital Signs BP 132/76 (BP Location: Right Arm)   Pulse 86   Temp 97.7 F (36.5 C) (Oral)   Resp 16   Wt 105 lb (47.6 kg)   LMP 03/10/2009   SpO2 96%   BMI 15.97 kg/m   Visual Acuity Right Eye Distance:   Left Eye Distance:   Bilateral Distance:    Right Eye Near:   Left Eye Near:    Bilateral Near:     Physical Exam Vitals and nursing note reviewed.  Constitutional:      General: She is not in acute distress.    Appearance: Normal appearance. She is not toxic-appearing.  HENT:     Head: Normocephalic and atraumatic.     Mouth/Throat:     Mouth: Mucous membranes are moist.     Pharynx: Oropharynx is clear.  Pulmonary:     Effort: Pulmonary effort is normal. No respiratory distress.  Musculoskeletal:     Right foot: Normal capillary refill. Normal pulse.     Comments: Right lower extremity is neurovascularly intact  Skin:    General: Skin is warm and dry.     Capillary Refill: Capillary refill takes less than 2 seconds.     Coloration: Skin is not jaundiced or pale.     Findings: Erythema present.          Comments: Erythema and mild swelling noted to right lower extremity in approximately area marked; there is a circular, ulcerated wound to the right medial malleolus.  No weeping or drainage noted to the wound or of the right lower extremity.  No calf tenderness.  Neurological:     Mental Status: She is alert and oriented to person, place, and time.  Psychiatric:        Behavior: Behavior is cooperative.      UC Treatments / Results  Labs (all labs ordered are listed, but only abnormal results are displayed) Labs Reviewed - No data to display  EKG   Radiology No results  found.  Procedures Procedures (including critical care time)  Medications Ordered in UC Medications - No data to display  Initial Impression / Assessment and Plan / UC Course  I have reviewed the triage vital signs and the nursing notes.  Pertinent labs & imaging results that were available during my care of the patient were reviewed by me and considered in my medical decision making (see chart for details).   Patient is well-appearing, normotensive, afebrile, not tachycardic, not tachypneic, oxygenating well on room air.    1. Cellulitis of right lower extremity 2. Nonhealing nonsurgical wound Will cover patient for cellulitis with Keflex 4 times daily for 5 days Last kidney function GFR greater than 50 Recommended follow-up regarding nonhealing wound at wound center and referral placed today as well as contact information given Wound care discussed Recommended follow-up in ER if symptoms worsen over the weekend and strict ER precautions discussed  The patient was given  the opportunity to ask questions.  All questions answered to their satisfaction.  The patient is in agreement to this plan.    Final Clinical Impressions(s) / UC Diagnoses   Final diagnoses:  Cellulitis of right lower extremity  Nonhealing nonsurgical wound     Discharge Instructions      Take the Keflex as prescribed to treat the cellulitis in the skin around your wound  I have placed a referral to wound care-please give them a call to schedule an appointment; contact information is below  Please continue cleaning your skin twice daily with mild soap and water; please stop using hydrogen peroxide.  Continue bacitracin and nonadherent gauze    ED Prescriptions     Medication Sig Dispense Auth. Provider   cephALEXin (KEFLEX) 500 MG capsule Take 1 capsule (500 mg total) by mouth 4 (four) times daily for 5 days. 20 capsule Eulogio Bear, NP      PDMP not reviewed this encounter.   Eulogio Bear, NP 06/11/22 [phone]

## 2022-06-11 NOTE — Discharge Instructions (Addendum)
Take the Keflex as prescribed to treat the cellulitis in the skin around your wound  I have placed a referral to wound care-please give them a call to schedule an appointment; contact information is below  Please continue cleaning your skin twice daily with mild soap and water; please stop using hydrogen peroxide.  Continue bacitracin and nonadherent gauze

## 2022-06-14 ENCOUNTER — Encounter: Payer: Self-pay | Admitting: Physical Therapy

## 2022-06-14 ENCOUNTER — Ambulatory Visit (INDEPENDENT_AMBULATORY_CARE_PROVIDER_SITE_OTHER): Payer: Medicare PPO | Admitting: Physical Therapy

## 2022-06-14 DIAGNOSIS — M25672 Stiffness of left ankle, not elsewhere classified: Secondary | ICD-10-CM | POA: Diagnosis not present

## 2022-06-14 DIAGNOSIS — M25562 Pain in left knee: Secondary | ICD-10-CM

## 2022-06-14 DIAGNOSIS — M6281 Muscle weakness (generalized): Secondary | ICD-10-CM

## 2022-06-14 DIAGNOSIS — R2689 Other abnormalities of gait and mobility: Secondary | ICD-10-CM

## 2022-06-14 DIAGNOSIS — R2681 Unsteadiness on feet: Secondary | ICD-10-CM

## 2022-06-14 DIAGNOSIS — R600 Localized edema: Secondary | ICD-10-CM

## 2022-06-14 DIAGNOSIS — M25572 Pain in left ankle and joints of left foot: Secondary | ICD-10-CM | POA: Diagnosis not present

## 2022-06-14 NOTE — Therapy (Signed)
OUTPATIENT PHYSICAL THERAPY LOWER EXTREMITY TREATMENT   Patient Name: Carolyn [name] MRN: [id number] DOB:06-22-1955, 67 y.o., female Today's Date: 06/14/2022  END OF SESSION:  PT End of Session - 06/14/22 0851     Visit Number 9    Number of Visits 30    Date for PT Re-Evaluation 08/13/22    Authorization Type [employer] PPO    Authorization Time Period $20 COPAY Approved  Authorization [id number]  Tracking [id number] PT VISITS 3/4-5/31/2024    Authorization - Visit Number 9    Authorization - Number of Visits 12    Progress Note Due on Visit 10    PT Start Time 0848    PT Stop Time 0930    PT Time Calculation (min) 42 min    Equipment Utilized During Treatment Gait belt    Activity Tolerance Patient tolerated treatment well    Behavior During Therapy WFL for tasks assessed/performed                  Past Medical History:  Diagnosis Date   Alcoholic cirrhosis    Chronic pancreatitis    Diabetes mellitus without complication    Endometriosis    GERD (gastroesophageal reflux disease)    Intractable nausea and vomiting 06/13/2020   Liver disease    Malabsorption    MALABSORPTION SYNDROME   Osteoporosis 03/2011   t score -2.5   Pancreatitis    due to cyst and tumors due to calcifications   Seizure    no seizure disorder, controlled w/ meds per patient   Vitamin D deficiency 2012   VIT D 15   Past Surgical History:  Procedure Laterality Date   APPENDECTOMY     BIOPSY  01/19/2022   Procedure: BIOPSY;  Surgeon: Lavena Bullion, DO;  Location: [facility];  Service: Gastroenterology;;   CHOLECYSTECTOMY     ESOPHAGOGASTRODUODENOSCOPY (EGD) WITH PROPOFOL N/A 01/19/2022   Procedure: ESOPHAGOGASTRODUODENOSCOPY (EGD) WITH PROPOFOL;  Surgeon: Lavena Bullion, DO;  Location: [facility];  Service: Gastroenterology;  Laterality: N/A;   FEEDING TUBE RELOCATION  2010   JEJUNOSTOMY FEEDING TUBE  1990   MULTIPLE GASTRIC SURGERIES     MYOMECTOMY      OPEN REDUCTION INTERNAL FIXATION (ORIF) DISTAL RADIAL FRACTURE Left 06/29/2018   Procedure: OPEN REDUCTION INTERNAL FIXATION (ORIF)LEFT  DISTAL RADIAL FRACTURE;  Surgeon: Leanora Cover, MD;  Location: [place];  Service: Orthopedics;  Laterality: Left;   ROUX-EN-Y GASTRIC BYPASS     TIBIA IM NAIL INSERTION Left 04/21/2022   Procedure: LEFT INTRAMEDULLARY (IM) NAIL TIBIAL;  Surgeon: Marybelle Killings, MD;  Location: [place];  Service: Orthopedics;  Laterality: Left;   TIBIA IM NAIL INSERTION Left 05/05/2022   Procedure: LEFT TIBIAL NAIL DISTAL SCREW PLACEMENT;  Surgeon: Marybelle Killings, MD;  Location: [place];  Service: Orthopedics;  Laterality: Left;   TUBAL LIGATION     Patient Active Problem List   Diagnosis Date Noted   Palliative care patient 06/02/2022   Closed fracture of left tibia and fibula 05/05/2022   Displaced comminuted fracture of shaft of left tibia, initial encounter for closed fracture 04/21/2022   Closed fracture of proximal end of left fibula, initial encounter 04/21/2022   Leukocytosis 04/21/2022   Influenza A 04/06/2022   History of gastric ulcer 03/23/2022   Abnormal loss of weight 03/23/2022   Aspiration pneumonia of both lungs 03/23/2022   Acute on chronic diastolic CHF (congestive heart failure) 03/23/2022   NSVT (nonsustained  ventricular tachycardia) 03/20/2022   Localized swelling of right upper extremity 03/19/2022   Cellulitis of left lower extremity 03/17/2022   Hypophosphatemia 03/17/2022   Anemia of chronic disease 03/17/2022   Sepsis 03/14/2022   Severe sepsis 03/13/2022   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   Abnormal CT of the abdomen 01/19/2022   Chronic gastric ulcer with perforation [id number]   Alcoholic cirrhosis of liver without ascites 01/18/2022   Occult blood in stools 01/18/2022   Portal hypertensive gastropathy 01/18/2022   Anasarca 01/12/2022   Acute respiratory failure with hypoxia 01/11/2022   Hypoalbuminemia 01/11/2022   Pleural  effusion 01/10/2022   Abdominal distention 01/10/2022   Physical deconditioning 01/09/2022   Pressure injury of skin 01/08/2022   Hypokalemia 01/07/2022   Hypomagnesemia 01/07/2022   AKI (acute kidney injury) 01/07/2022   Severe protein-calorie malnutrition 01/07/2022   Cellulitis of right foot 01/07/2022   Normocytic anemia 01/07/2022   Osteomyelitis 01/06/2022   Intractable nausea and vomiting [id number]   Metabolic acidosis, increased anion gap 06/12/2020   Seizure ([place]) 05/08/2017   Chronic pain syndrome 05/08/2017   Gastroesophageal reflux disease without esophagitis 05/08/2017   Chronic alcoholic pancreatitis [id number]   Pancreatic steatorrhea 05/08/2017   Osteoporosis 03/16/2011   Endometriosis    Malabsorption    Chronic pancreatitis    Vitamin D deficiency     PCP: General PCP   REFERRING PROVIDER:  Marybelle Killings, MD   REFERRING DIAG:  S82.252A (ICD-10-CM) - Displaced comminuted fracture of shaft of left tibia, initial encounter for closed fracture  S82.832A (ICD-10-CM) - Closed fracture of proximal end of left fibula, initial encounter    THERAPY DIAG:  Muscle weakness (generalized)  Acute pain of left knee  Pain in left ankle and joints of left foot  Stiffness of left ankle, not elsewhere classified  Other abnormalities of gait and mobility  Localized edema  Unsteadiness on feet  Rationale for Evaluation and Treatment: Rehabilitation  ONSET DATE: 04/21/2022, 1st ankle sg, 05/05/2022 2nd surgery  SUBJECTIVE:   SUBJECTIVE STATEMENT:   she went to urgent care due to right ankle wound red & sore.  NP diagnosed her with cellulitis & put her on Keflex.    PERTINENT HISTORY: DM, Alcoholism in remission, chronic pancreatitis, hepatic cirrhosis, She had prolonged hospitalization 03/13/2022-1/29/204 with severe sepsis included hypoxic respiratory failure.   PAIN: NPRS scale: today left knee  2/10 & ankle 3/10 and since last PT lowest knee 2/10, ankle 3/10  &  highest knee 2/10 ankle 3/10 Pain location: left knee more patella & ankle more medially Pain description: ache,  Aggravating factors:  meds wearing off, over night with increased in morning Relieving factors: meds, elevating & not moving knee / ankle  PRECAUTIONS: Fall  WEIGHT BEARING RESTRICTIONS: Yes LLE WBAT as of 05/18/2022  FALLS:  Has patient fallen in last 6 months? Yes. Number of falls 1  LIVING ENVIRONMENT: Lives with: lives with their spouse and 3 dogs 58, 41 & 50# Lives in: House single story  Stairs: Yes: External: 6 (long width)  steps; on right going up temporary ramp primary entrance Has following equipment at home: Gilford Rile - 2 wheeled, Wheelchair (manual), Shower bench, bed side commode, Grab bars, and Ramped entry  OCCUPATION: retired   PLOF: Independent  PATIENT GOALS:   get back to function in house & access community  Next MD visit:  05/18/2022  OBJECTIVE:   DIAGNOSTIC FINDINGS: 05/11/2022: Post distal tibial screw placement.  AP knee AP ankle appear to be in good  rotational alignment.   PATIENT SURVEYS:  05/17/2022 / Evaluation:  FOTO intake:  34%  predicted:  47%  COGNITION: 05/17/2022 / Evaluation:   Overall cognitive status: WFL    SENSATION: 05/17/2022 / Evaluation:   WFL for knee area but unable to test ankle as wrapped for wound  EDEMA:  05/17/2022 / Evaluation:  left foot & ankle appear edematous but wrapped due to wound.  POSTURE:  05/17/2022 / Evaluation:  rounded shoulders, forward head, increased thoracic kyphosis, flexed trunk , and weight shift right  PALPATION: 05/17/2022 / Evaluation: not tested due to wrapped for wound  LOWER EXTREMITY ROM:   ROM Left Eval 05/17/22 Left 05/26/22  Hip flexion    Hip extension    Hip abduction    Hip adduction    Hip internal rotation    Hip external rotation    Knee flexion    Knee extension Seated A: LAQ -22* Seated  A: LAQ -11*  Ankle dorsiflexion  Seated knee flexed A: -3* P: +2*   Ankle  plantarflexion  P & A 19*  Ankle inversion    Ankle eversion    Ankle Not tested at evaluation due to wound with bandages.  (Blank rows = not tested)  LOWER EXTREMITY MMT:  MMT Right eval Left eval  Hip flexion    Hip extension    Hip abduction    Hip adduction    Hip internal rotation    Hip external rotation    Knee flexion  3-/5  Knee extension  3-/5  Ankle dorsiflexion    Ankle plantarflexion    Ankle inversion    Ankle eversion    Ankle Not tested at evaluation due to Toe touch Weightbearing status.  (Blank rows = not tested)   FUNCTIONAL TESTS:  05/17/2022 / Evaluation: 18 inch chair transfer:  requires armrests or RW to push up to arise.  Requires RW support for stabilization  GAIT: 05/17/2022 / Evaluation: Distance walked: 30' Assistive device utilized: Environmental consultant - 2 wheeled Level of assistance: SBA Comments: NWB LLE.  Turns with pivoting on foot incorrectly.    TODAY'S TREATMENT                                                                          DATE: 06/14/2022: PT working with left shoe instead of CAM boot in clinic only. Pt reports no change in pain  Therapeutic Exercise: Precor recumbent bike seat 6 with BLEs level 4 for 8 min Leg press BLEs 100# 15 reps 1 sets & 11 reps 2nd set due to fatigue;  single leg 31# LLE 5 reps & RLE 10 reps 2 sets Standing with RW support red theraband kicks abd, ext, add & flex 10 reps ea motion BLEs.  PT cues & supervsion.   Gait Training: Pt amb with RW 100' X 2 with shoe LLE instead of CAM boot with WBAT supervision. No change in pain. Pt amb up / down 12* incline with RW WBAT with supervision. Pt neg stairs with 2 rails with alternating pattern with supervision. PT cues.    06/09/2022: Since pain is same except that one middle of night pain, PT thinks it was from increasing distance with CAM boot too rapidly.  PT working with left shoe instead of CAM boot in clinic only. Pt reports no change in pain  Therapeutic  Exercise: Precor recumbent bike seat 6 with BLEs level 3 for 8 min Leg press BLEs 81# 15 reps 1 sets 10 reps 2nd set reps limited by LE fatigue;  single leg 25# 10 reps 2 sets BLEs (PT  Standing gastroc & soleus stretch step heel depression 30 sec 2 reps ea BLEs Standing pronation & supination stretch with lateral & medial foot on folded towel in bilateral stance with BUE support for gentle stretch 30 sec ea direction 2 reps. Pt reports no pain but feels stretch. PT updated HEP with demo, verbal & HO cues. She performed 1 set of 10 reps of strengthening.  -Seated Leg Press with blue Resistance  - 1 x daily - 7 x weekly - 2 sets - 10 reps - 5 seconds hold - Seated Ankle Plantarflexion with red Resistance  - 1 x daily - 7 x weekly - 2 sets - 10 reps - 5 seconds hold - Ankle Dorsiflexion with red Resistance  - 1 x daily - 7 x weekly - 2 sets - 10 reps - 5 seconds hold - Seated Figure 4 Ankle Inversion with red Resistance  - 1 x daily - 7 x weekly - 2 sets - 10 reps - 5 seconds hold - Seated Ankle Eversion with Anchored red Resistance  - 1 x daily - 7 x weekly - 1 sets - 10 reps - 5 seconds hold - Standing Gastroc Stretch on Step  - 1 x daily - 7 x weekly - 1 sets - 2-3 reps - 30 seconds hold - Standing Soleus Stretch on Step  - 1 x daily - 7 x weekly - 1 sets - 2-3 reps - 30 seconds hold  Gait Training: Pt amb with RW 100' X 2 with shoe LLE instead of CAM boot with WBAT supervision. No change in pain. PT recommending to continue not using CAM boot inside home where flat smooth surfaces and limited distances. Continue RW & WBAT.  No barefoot or slippers.  Pt verbalized understanding.    06/07/2022: PT working with left shoe instead of CAM boot in clinic only. Pt reports no change in pain  Therapeutic Exercise: Precor recumbent bike seat 6 with BLEs level 3 for 8 min Standing gastroc & soleus stretch step heel depression 30 sec 2 reps ea BLEs Standing pronation & supination stretch with lateral &  medial foot on folded towel in bilateral stance with BUE support for gentle stretch 30 sec ea direction 2 reps. Pt reports no pain but feels stretch.   Neuromuscular Re-ed Rocker board with BUE support right / level / left & forward / level / backward 1 min ea. 1st set with square board 2 pivot points, 2nd set advanced to round board single pivot point. Tennis ball rolls BUE support 10 reps ea motion BLEs - ant/post, med/lat & circles.    Gait Training: Pt amb with RW 100' X 2 with shoe LLE instead of CAM boot with WBAT supervision. No change in pain. PT recommending not using CAM boot inside home where flat smooth surfaces and limited distances. Continue RW & WBAT.  No barefoot or slippers.  Pt verbalized understanding.    PATIENT EDUCATION:  Education details: HEP, POC Person educated: Patient Education method: Consulting civil engineer, Demonstration, Verbal cues, and Handouts Education comprehension: verbalized understanding, returned demonstration, and verbal cues required  HOME EXERCISE PROGRAM: Access Code: [email or web address] URL:  [email or web address] Date: 06/09/2022 Prepared by: Jamey Reas  Exercises - seated marching  - 2 x daily - 7 x weekly - 3 sets - 10 reps - 5 seconds hold - Seated Hip Abduction  - 2 x daily - 7 x weekly - 3 sets - 10 reps - 5 seconds hold - Seated Hamstring Stretch  - 2 x daily - 7 x weekly - 1 sets - 3 reps - 20-30 seconds hold - Seated Ankle Pumps  - 2 x daily - 7 x weekly - 3 sets - 10 reps - 5 seconds hold - Seated Ankle Circles  - 2 x daily - 7 x weekly - 3 sets - 10 reps - Seated Leg Press with Resistance  - 1 x daily - 7 x weekly - 2 sets - 10 reps - 5 seconds hold - Seated Ankle Plantarflexion with Resistance  - 1 x daily - 7 x weekly - 2 sets - 10 reps - 5 seconds hold - Ankle Dorsiflexion with Resistance  - 1 x daily - 7 x weekly - 2 sets - 10 reps - 5 seconds hold - Seated Figure 4 Ankle Inversion with Resistance  - 1 x daily - 7 x weekly - 2  sets - 10 reps - 5 seconds hold - Seated Ankle Eversion with Anchored Resistance  - 1 x daily - 7 x weekly - 1 sets - 10 reps - 5 seconds hold - Standing Gastroc Stretch on Step  - 1 x daily - 7 x weekly - 1 sets - 2-3 reps - 30 seconds hold - Standing Soleus Stretch on Step  - 1 x daily - 7 x weekly - 1 sets - 2-3 reps - 30 seconds hold  ASSESSMENT:  CLINICAL IMPRESSION: Patient appears to be building strength slowly.  She is tolerating more standing activities with more weight on LLE without pain issues.    Patient continues to benefit from skilled PT.  OBJECTIVE IMPAIRMENTS: Abnormal gait, cardiopulmonary status limiting activity, decreased activity tolerance, decreased balance, decreased coordination, decreased endurance, decreased knowledge of condition, decreased knowledge of use of DME, decreased mobility, difficulty walking, decreased ROM, decreased strength, increased edema, increased muscle spasms, impaired flexibility, impaired sensation, postural dysfunction, and pain.   ACTIVITY LIMITATIONS: carrying, lifting, bending, standing, squatting, sleeping, stairs, transfers, reach over head, and locomotion level  PARTICIPATION LIMITATIONS: meal prep, cleaning, driving, and community activity  PERSONAL FACTORS: Fitness, Time since onset of injury/illness/exacerbation, and 3+ comorbidities: see PMH  are also affecting patient's functional outcome.   REHAB POTENTIAL: Good  CLINICAL DECISION MAKING: Stable/uncomplicated  EVALUATION COMPLEXITY: Low   GOALS: Goals reviewed with patient? Yes  SHORT TERM GOALS: (target date for Short term goals are 30 days 06/16/2022)   1.  Patient will demonstrate independent use of home exercise program to maintain progress from in clinic treatments.  Goal status: MET 06/14/2022  2. Patient amb 38' with RW with TDWBing LLE (or higher if MD permits) with supervision.  Goal Status: MET 06/14/2022  LONG TERM GOALS: (target dates for all long term goals  are 90 days 08/13/2022)   1. Patient will demonstrate/report pain at worst less than or equal to 2/10 to facilitate minimal limitation in daily activity secondary to pain symptoms. Goal status: New   2. Patient will demonstrate independent use of home exercise program to facilitate ability to maintain/progress functional gains from skilled physical therapy services. Goal status: New   3. Patient will demonstrate FOTO outcome > or = 47 %  to indicate reduced disability due to condition.  Goal status: New   4.  Patient will demonstrate LLE MMT 5/5 throughout to faciltiate usual transfers, stairs, squatting at [facility] for daily life.   Goal status: New   5.  Patient ambulates >500' with LRAD and neg ramp, curb & stairs single rail modified independent.  Goal status: New   6.  AROM of Left knee WFL & left ankle within 75% of right ankle Goal status: New   PLAN:  PT FREQUENCY: 2-3 x/week  PT DURATION: 13 weeks (90 days)  PLANNED INTERVENTIONS: Therapeutic exercises, Therapeutic activity, Neuro Muscular re-education, Balance training, Gait training, Patient/Family education, Joint mobilization, Stair training, DME instructions, Dry Needling, Electrical stimulation, Traction, Cryotherapy, vasopneumatic deviceMoist heat, Taping, Ultrasound, Ionotophoresis 4mg /ml Dexamethasone, and Manual therapy.  All included unless contraindicated  PLAN FOR NEXT SESSION:   do 10th visit note, continue to check on gait with shoe / RW WBAT in home, Cont with there exer & therapeutic activities to tolerance including balance activities   Jamey Reas, PT, DPT 06/14/2022, 12:48 PM

## 2022-06-15 ENCOUNTER — Ambulatory Visit (INDEPENDENT_AMBULATORY_CARE_PROVIDER_SITE_OTHER): Payer: Medicare PPO | Admitting: Orthopedic Surgery

## 2022-06-15 DIAGNOSIS — S91002A Unspecified open wound, left ankle, initial encounter: Secondary | ICD-10-CM | POA: Diagnosis not present

## 2022-06-15 DIAGNOSIS — L97819 Non-pressure chronic ulcer of other part of right lower leg with unspecified severity: Secondary | ICD-10-CM

## 2022-06-16 ENCOUNTER — Encounter: Payer: Self-pay | Admitting: Physical Therapy

## 2022-06-16 ENCOUNTER — Ambulatory Visit: Payer: Medicare PPO | Admitting: Physical Therapy

## 2022-06-16 DIAGNOSIS — M25672 Stiffness of left ankle, not elsewhere classified: Secondary | ICD-10-CM

## 2022-06-16 DIAGNOSIS — R2689 Other abnormalities of gait and mobility: Secondary | ICD-10-CM

## 2022-06-16 DIAGNOSIS — M6281 Muscle weakness (generalized): Secondary | ICD-10-CM | POA: Diagnosis not present

## 2022-06-16 DIAGNOSIS — M25562 Pain in left knee: Secondary | ICD-10-CM

## 2022-06-16 DIAGNOSIS — R2681 Unsteadiness on feet: Secondary | ICD-10-CM

## 2022-06-16 DIAGNOSIS — M25572 Pain in left ankle and joints of left foot: Secondary | ICD-10-CM

## 2022-06-16 DIAGNOSIS — R600 Localized edema: Secondary | ICD-10-CM

## 2022-06-16 NOTE — Therapy (Signed)
OUTPATIENT PHYSICAL THERAPY LOWER EXTREMITY TREATMENT & 10TH VISIT PROGRESS NOTE   Patient Name: Carolyn [name] MRN: [id number] DOB:Nov 27, 1955, 67 y.o., female Today's Date: 06/16/2022  Progress Note Reporting Period 05/17/2022 to 06/16/2022  See note below for Objective Data and Assessment of Progress/Goals.      END OF SESSION:  PT End of Session - 06/16/22 0848     Visit Number 10    Number of Visits 30    Date for PT Re-Evaluation 08/13/22    Authorization Type [facility] Medicare Choice PPO    Authorization Time Period $20 COPAY Approved  Authorization [id number]  Tracking [id number] PT VISITS 3/4-5/31/2024    Authorization - Visit Number 10    Authorization - Number of Visits 12    Progress Note Due on Visit 20    PT Start Time [id number]    PT Stop Time 0928    PT Time Calculation (min) 41 min    Equipment Utilized During Treatment Gait belt    Activity Tolerance Patient tolerated treatment well    Behavior During Therapy WFL for tasks assessed/performed                  Past Medical History:  Diagnosis Date   Alcoholic cirrhosis    Chronic pancreatitis    Diabetes mellitus without complication    Endometriosis    GERD (gastroesophageal reflux disease)    Intractable nausea and vomiting 06/13/2020   Liver disease    Malabsorption    MALABSORPTION SYNDROME   Osteoporosis 03/2011   t score -2.5   Pancreatitis    due to cyst and tumors due to calcifications   Seizure    no seizure disorder, controlled w/ meds per patient   Vitamin D deficiency 2012   VIT D 15   Past Surgical History:  Procedure Laterality Date   APPENDECTOMY     BIOPSY  01/19/2022   Procedure: BIOPSY;  Surgeon: Lavena Bullion, DO;  Location: [facility];  Service: Gastroenterology;;   CHOLECYSTECTOMY     ESOPHAGOGASTRODUODENOSCOPY (EGD) WITH PROPOFOL N/A 01/19/2022   Procedure: ESOPHAGOGASTRODUODENOSCOPY (EGD) WITH PROPOFOL;  Surgeon: Lavena Bullion, DO;  Location: [facility];   Service: Gastroenterology;  Laterality: N/A;   FEEDING TUBE RELOCATION  2010   JEJUNOSTOMY FEEDING TUBE  1990   MULTIPLE GASTRIC SURGERIES     MYOMECTOMY     OPEN REDUCTION INTERNAL FIXATION (ORIF) DISTAL RADIAL FRACTURE Left 06/29/2018   Procedure: OPEN REDUCTION INTERNAL FIXATION (ORIF)LEFT  DISTAL RADIAL FRACTURE;  Surgeon: Leanora Cover, MD;  Location: [place];  Service: Orthopedics;  Laterality: Left;   ROUX-[id number] GASTRIC BYPASS     TIBIA IM NAIL INSERTION Left 04/21/2022   Procedure: LEFT INTRAMEDULLARY (IM) NAIL TIBIAL;  Surgeon: Marybelle Killings, MD;  Location: [place];  Service: Orthopedics;  Laterality: Left;   TIBIA IM NAIL INSERTION Left 05/05/2022   Procedure: LEFT TIBIAL NAIL DISTAL SCREW PLACEMENT;  Surgeon: Marybelle Killings, MD;  Location: [place];  Service: Orthopedics;  Laterality: Left;   TUBAL LIGATION     Patient Active Problem List   Diagnosis Date Noted   Palliative care patient 06/02/2022   Closed fracture of left tibia and fibula 05/05/2022   Displaced comminuted fracture of shaft of left tibia, initial encounter for closed fracture 04/21/2022   Closed fracture of proximal end of left fibula, initial encounter 04/21/2022   Leukocytosis 04/21/2022   Influenza A 04/06/2022   History of gastric ulcer 03/23/2022  Abnormal loss of weight 03/23/2022   Aspiration pneumonia of both lungs 03/23/2022   Acute on chronic diastolic CHF (congestive heart failure) 03/23/2022   NSVT (nonsustained ventricular tachycardia) 03/20/2022   Localized swelling of right upper extremity 03/19/2022   Cellulitis of left lower extremity 03/17/2022   Hypophosphatemia 03/17/2022   Anemia of chronic disease 03/17/2022   Sepsis 03/14/2022   Severe sepsis 03/13/2022   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   Abnormal CT of the abdomen 01/19/2022   Chronic gastric ulcer with perforation [id number]   Alcoholic cirrhosis of liver without ascites 01/18/2022   Occult blood in stools 01/18/2022    Portal hypertensive gastropathy 01/18/2022   Anasarca 01/12/2022   Acute respiratory failure with hypoxia 01/11/2022   Hypoalbuminemia 01/11/2022   Pleural effusion 01/10/2022   Abdominal distention 01/10/2022   Physical deconditioning 01/09/2022   Pressure injury of skin 01/08/2022   Hypokalemia 01/07/2022   Hypomagnesemia 01/07/2022   AKI (acute kidney injury) 01/07/2022   Severe protein-calorie malnutrition 01/07/2022   Cellulitis of right foot 01/07/2022   Normocytic anemia 01/07/2022   Osteomyelitis 01/06/2022   Intractable nausea and vomiting [id number]   Metabolic acidosis, increased anion gap 06/12/2020   Seizure ([place]) 05/08/2017   Chronic pain syndrome 05/08/2017   Gastroesophageal reflux disease without esophagitis 05/08/2017   Chronic alcoholic pancreatitis [id number]   Pancreatic steatorrhea 05/08/2017   Osteoporosis 03/16/2011   Endometriosis    Malabsorption    Chronic pancreatitis    Vitamin D deficiency     PCP: General PCP   REFERRING PROVIDER:  Marybelle Killings, MD   REFERRING DIAG:  S82.252A (ICD-10-CM) - Displaced comminuted fracture of shaft of left tibia, initial encounter for closed fracture  S82.832A (ICD-10-CM) - Closed fracture of proximal end of left fibula, initial encounter    THERAPY DIAG:  Muscle weakness (generalized)  Acute pain of left knee  Pain in left ankle and joints of left foot  Stiffness of left ankle, not elsewhere classified  Other abnormalities of gait and mobility  Localized edema  Unsteadiness on feet  Rationale for Evaluation and Treatment: Rehabilitation  ONSET DATE: 04/21/2022, 1st ankle sg, 05/05/2022 2nd surgery  SUBJECTIVE:   SUBJECTIVE STATEMENT:  Dr. Sharol Given thinks the wounds are healing but was concerned with redness on right lower leg. She has appt PCP on 4/8 and Dr. Lorin Mercy on 4/9.     PERTINENT HISTORY: DM, Alcoholism in remission, chronic pancreatitis, hepatic cirrhosis, She had prolonged hospitalization  03/13/2022-1/29/204 with severe sepsis included hypoxic respiratory failure.   PAIN: NPRS scale: today left knee 3/10 & ankle 3/10 and since last PT lowest knee 2/10, ankle 3/10  & highest knee 3/10 ankle 3/10 Pain location: left knee more patella & ankle more medially Pain description: ache,  Aggravating factors:  meds wearing off, over night with increased in morning Relieving factors: meds, elevating & not moving knee / ankle  PRECAUTIONS: Fall  WEIGHT BEARING RESTRICTIONS: Yes LLE WBAT as of 05/18/2022  FALLS:  Has patient fallen in last 6 months? Yes. Number of falls 1  LIVING ENVIRONMENT: Lives with: lives with their spouse and 3 dogs 30, 77 & 50# Lives in: House single story  Stairs: Yes: External: 6 (long width)  steps; on right going up temporary ramp primary entrance Has following equipment at home: Gilford Rile - 2 wheeled, Wheelchair (manual), Shower bench, bed side commode, Grab bars, and Ramped entry  OCCUPATION: retired   PLOF: Independent  PATIENT GOALS:   get back to function in house &  access community  Next MD visit:  05/18/2022  OBJECTIVE:   DIAGNOSTIC FINDINGS: 05/11/2022: Post distal tibial screw placement.  AP knee AP ankle appear to be in good rotational alignment.   PATIENT SURVEYS:  05/17/2022 / Evaluation:  FOTO intake:  34%  predicted:  47%  COGNITION: 05/17/2022 / Evaluation:   Overall cognitive status: WFL    SENSATION: 05/17/2022 / Evaluation:   WFL for knee area but unable to test ankle as wrapped for wound  EDEMA:  05/17/2022 / Evaluation:  left foot & ankle appear edematous but wrapped due to wound.  POSTURE:  05/17/2022 / Evaluation:  rounded shoulders, forward head, increased thoracic kyphosis, flexed trunk , and weight shift right  PALPATION: 05/17/2022 / Evaluation: not tested due to wrapped for wound  LOWER EXTREMITY ROM:   ROM Left Eval 05/17/22 Left 05/26/22 Left 06/16/22  Hip flexion     Hip extension     Hip abduction     Hip adduction      Hip internal rotation     Hip external rotation     Knee flexion     Knee extension Seated A: LAQ -22* Seated  A: LAQ -11* Seated  A: LAQ -9*  Ankle dorsiflexion  Seated knee flexed A: -3* P: +2*  Seated knee flexed A: +3* P: +6*  Ankle plantarflexion  P & A 19* P & A 21*  Ankle inversion     Ankle eversion     Ankle Not tested at evaluation due to wound with bandages.  (Blank rows = not tested)  LOWER EXTREMITY MMT:  MMT Right eval Left eval  Hip flexion    Hip extension    Hip abduction    Hip adduction    Hip internal rotation    Hip external rotation    Knee flexion  3-/5  Knee extension  3-/5  Ankle dorsiflexion    Ankle plantarflexion    Ankle inversion    Ankle eversion    Ankle Not tested at evaluation due to Toe touch Weightbearing status.  (Blank rows = not tested)   FUNCTIONAL TESTS:  05/17/2022 / Evaluation: 18 inch chair transfer:  requires armrests or RW to push up to arise.  Requires RW support for stabilization  GAIT: 05/17/2022 / Evaluation: Distance walked: 30' Assistive device utilized: Environmental consultant - 2 wheeled Level of assistance: SBA Comments: NWB LLE.  Turns with pivoting on foot incorrectly.    TODAY'S TREATMENT                                                                          DATE: 06/16/2022: PT working with left shoe instead of CAM boot in clinic only. Pt reports no change in pain  Therapeutic Exercise: Precor recumbent bike seat 6 with BLEs level 4 for 8 min Leg press BLEs 100# 15 reps 2 sets;  single leg 31# LLE 6 reps & RLE 10 reps 2 sets Seated hamstring stretch with strap 30 sec hold 2 reps See ROM  Neuromuscular Re-educatoin: Counter BUE support: braiding 3 laps RW support tandem gait 10' X 6 RW support marching 25'   Gait Training: Pt amb with RW 100' X 2 with shoe LLE instead of CAM boot  with WBAT supervision. No change in pain. PT continues to remind pt to use RW until MD appt & clearance for increasing weight  bearing.     06/14/2022: PT working with left shoe instead of CAM boot in clinic only. Pt reports no change in pain  Therapeutic Exercise: Precor recumbent bike seat 6 with BLEs level 4 for 8 min Leg press BLEs 100# 15 reps 1 sets & 11 reps 2nd set due to fatigue;  single leg 31# LLE 5 reps & RLE 10 reps 2 sets Standing with RW support red theraband kicks abd, ext, add & flex 10 reps ea motion BLEs.  PT cues & supervsion.   Gait Training: Pt amb with RW 100' X 2 with shoe LLE instead of CAM boot with WBAT supervision. No change in pain. Pt amb up / down 12* incline with RW WBAT with supervision. Pt neg stairs with 2 rails with alternating pattern with supervision. PT cues.    06/09/2022: Since pain is same except that one middle of night pain, PT thinks it was from increasing distance with CAM boot too rapidly.  PT working with left shoe instead of CAM boot in clinic only. Pt reports no change in pain  Therapeutic Exercise: Precor recumbent bike seat 6 with BLEs level 3 for 8 min Leg press BLEs 81# 15 reps 1 sets 10 reps 2nd set reps limited by LE fatigue;  single leg 25# 10 reps 2 sets BLEs (PT  Standing gastroc & soleus stretch step heel depression 30 sec 2 reps ea BLEs Standing pronation & supination stretch with lateral & medial foot on folded towel in bilateral stance with BUE support for gentle stretch 30 sec ea direction 2 reps. Pt reports no pain but feels stretch. PT updated HEP with demo, verbal & HO cues. She performed 1 set of 10 reps of strengthening.  -Seated Leg Press with blue Resistance  - 1 x daily - 7 x weekly - 2 sets - 10 reps - 5 seconds hold - Seated Ankle Plantarflexion with red Resistance  - 1 x daily - 7 x weekly - 2 sets - 10 reps - 5 seconds hold - Ankle Dorsiflexion with red Resistance  - 1 x daily - 7 x weekly - 2 sets - 10 reps - 5 seconds hold - Seated Figure 4 Ankle Inversion with red Resistance  - 1 x daily - 7 x weekly - 2 sets - 10 reps - 5 seconds  hold - Seated Ankle Eversion with Anchored red Resistance  - 1 x daily - 7 x weekly - 1 sets - 10 reps - 5 seconds hold - Standing Gastroc Stretch on Step  - 1 x daily - 7 x weekly - 1 sets - 2-3 reps - 30 seconds hold - Standing Soleus Stretch on Step  - 1 x daily - 7 x weekly - 1 sets - 2-3 reps - 30 seconds hold  Gait Training: Pt amb with RW 100' X 2 with shoe LLE instead of CAM boot with WBAT supervision. No change in pain. PT recommending to continue not using CAM boot inside home where flat smooth surfaces and limited distances. Continue RW & WBAT.  No barefoot or slippers.  Pt verbalized understanding.     PATIENT EDUCATION:  Education details: HEP, POC Person educated: Patient Education method: Consulting civil engineer, Demonstration, Verbal cues, and Handouts Education comprehension: verbalized understanding, returned demonstration, and verbal cues required  HOME EXERCISE PROGRAM: Access Code: [email or web address] URL: [email or web address] Date: 06/09/2022  Prepared by: Jamey Reas  Exercises - seated marching  - 2 x daily - 7 x weekly - 3 sets - 10 reps - 5 seconds hold - Seated Hip Abduction  - 2 x daily - 7 x weekly - 3 sets - 10 reps - 5 seconds hold - Seated Hamstring Stretch  - 2 x daily - 7 x weekly - 1 sets - 3 reps - 20-30 seconds hold - Seated Ankle Pumps  - 2 x daily - 7 x weekly - 3 sets - 10 reps - 5 seconds hold - Seated Ankle Circles  - 2 x daily - 7 x weekly - 3 sets - 10 reps - Seated Leg Press with Resistance  - 1 x daily - 7 x weekly - 2 sets - 10 reps - 5 seconds hold - Seated Ankle Plantarflexion with Resistance  - 1 x daily - 7 x weekly - 2 sets - 10 reps - 5 seconds hold - Ankle Dorsiflexion with Resistance  - 1 x daily - 7 x weekly - 2 sets - 10 reps - 5 seconds hold - Seated Figure 4 Ankle Inversion with Resistance  - 1 x daily - 7 x weekly - 2 sets - 10 reps - 5 seconds hold - Seated Ankle Eversion with Anchored Resistance  - 1 x daily - 7 x weekly - 1 sets  - 10 reps - 5 seconds hold - Standing Gastroc Stretch on Step  - 1 x daily - 7 x weekly - 1 sets - 2-3 reps - 30 seconds hold - Standing Soleus Stretch on Step  - 1 x daily - 7 x weekly - 1 sets - 2-3 reps - 30 seconds hold  ASSESSMENT:  CLINICAL IMPRESSION: Patient has improved Weight bearing tolerance and ankle / knee range.  She has improved gait & balance with activities but PT continues to have BUE support until MD clears full weight.  She is ambulating with tennis shoe in home with level surfaces & short distances, but using CAM boot for all gait outside home at this time.  Patient continues to benefit from skilled PT.  OBJECTIVE IMPAIRMENTS: Abnormal gait, cardiopulmonary status limiting activity, decreased activity tolerance, decreased balance, decreased coordination, decreased endurance, decreased knowledge of condition, decreased knowledge of use of DME, decreased mobility, difficulty walking, decreased ROM, decreased strength, increased edema, increased muscle spasms, impaired flexibility, impaired sensation, postural dysfunction, and pain.   ACTIVITY LIMITATIONS: carrying, lifting, bending, standing, squatting, sleeping, stairs, transfers, reach over head, and locomotion level  PARTICIPATION LIMITATIONS: meal prep, cleaning, driving, and community activity  PERSONAL FACTORS: Fitness, Time since onset of injury/illness/exacerbation, and 3+ comorbidities: see PMH  are also affecting patient's functional outcome.   REHAB POTENTIAL: Good  CLINICAL DECISION MAKING: Stable/uncomplicated  EVALUATION COMPLEXITY: Low   GOALS: Goals reviewed with patient? Yes  SHORT TERM GOALS: (target date for Short term goals are 30 days 06/16/2022)   1.  Patient will demonstrate independent use of home exercise program to maintain progress from in clinic treatments.  Goal status: MET 06/14/2022  2. Patient amb 76' with RW with TDWBing LLE (or higher if MD permits) with supervision.  Goal Status:  MET 06/14/2022  LONG TERM GOALS: (target dates for all long term goals are 90 days 08/13/2022)   1. Patient will demonstrate/report pain at worst less than or equal to 2/10 to facilitate minimal limitation in daily activity secondary to pain symptoms. Goal status: New   2. Patient will demonstrate independent use  of home exercise program to facilitate ability to maintain/progress functional gains from skilled physical therapy services. Goal status: New   3. Patient will demonstrate FOTO outcome > or = 47 % to indicate reduced disability due to condition.  Goal status: New   4.  Patient will demonstrate LLE MMT 5/5 throughout to faciltiate usual transfers, stairs, squatting at [facility] for daily life.   Goal status: New   5.  Patient ambulates >500' with LRAD and neg ramp, curb & stairs single rail modified independent.  Goal status: New   6.  AROM of Left knee WFL & left ankle within 75% of right ankle Goal status: New   PLAN:  PT FREQUENCY: 2-3 x/week  PT DURATION: 13 weeks (90 days)  PLANNED INTERVENTIONS: Therapeutic exercises, Therapeutic activity, Neuro Muscular re-education, Balance training, Gait training, Patient/Family education, Joint mobilization, Stair training, DME instructions, Dry Needling, Electrical stimulation, Traction, Cryotherapy, vasopneumatic deviceMoist heat, Taping, Ultrasound, Ionotophoresis 4mg /ml Dexamethasone, and Manual therapy.  All included unless contraindicated  PLAN FOR NEXT SESSION:    continue to check on gait with shoe / RW WBAT in home, Cont with there exer & therapeutic activities to tolerance including balance activities   Jamey Reas, PT, DPT 06/16/2022, 1:36 PM

## 2022-06-21 ENCOUNTER — Encounter: Payer: Self-pay | Admitting: Orthopedic Surgery

## 2022-06-21 ENCOUNTER — Other Ambulatory Visit: Payer: Self-pay

## 2022-06-21 ENCOUNTER — Ambulatory Visit (INDEPENDENT_AMBULATORY_CARE_PROVIDER_SITE_OTHER): Payer: Medicare PPO | Admitting: Internal Medicine

## 2022-06-21 ENCOUNTER — Encounter: Payer: Self-pay | Admitting: Internal Medicine

## 2022-06-21 VITALS — BP 133/76 | HR 100 | Temp 98.6°F | Ht 68.0 in | Wt 108.2 lb

## 2022-06-21 DIAGNOSIS — S82402P Unspecified fracture of shaft of left fibula, subsequent encounter for closed fracture with malunion: Secondary | ICD-10-CM | POA: Diagnosis not present

## 2022-06-21 DIAGNOSIS — K8689 Other specified diseases of pancreas: Secondary | ICD-10-CM

## 2022-06-21 DIAGNOSIS — M7989 Other specified soft tissue disorders: Secondary | ICD-10-CM

## 2022-06-21 DIAGNOSIS — F1090 Alcohol use, unspecified, uncomplicated: Secondary | ICD-10-CM

## 2022-06-21 DIAGNOSIS — S82202P Unspecified fracture of shaft of left tibia, subsequent encounter for closed fracture with malunion: Secondary | ICD-10-CM | POA: Diagnosis not present

## 2022-06-21 DIAGNOSIS — L308 Other specified dermatitis: Secondary | ICD-10-CM

## 2022-06-21 DIAGNOSIS — I5033 Acute on chronic diastolic (congestive) heart failure: Secondary | ICD-10-CM

## 2022-06-21 DIAGNOSIS — K860 Alcohol-induced chronic pancreatitis: Secondary | ICD-10-CM

## 2022-06-21 DIAGNOSIS — Z515 Encounter for palliative care: Secondary | ICD-10-CM

## 2022-06-21 DIAGNOSIS — R233 Spontaneous ecchymoses: Secondary | ICD-10-CM

## 2022-06-21 DIAGNOSIS — K7460 Unspecified cirrhosis of liver: Secondary | ICD-10-CM

## 2022-06-21 DIAGNOSIS — L03115 Cellulitis of right lower limb: Secondary | ICD-10-CM

## 2022-06-21 DIAGNOSIS — M79661 Pain in right lower leg: Secondary | ICD-10-CM

## 2022-06-21 DIAGNOSIS — G894 Chronic pain syndrome: Secondary | ICD-10-CM

## 2022-06-21 DIAGNOSIS — K7030 Alcoholic cirrhosis of liver without ascites: Secondary | ICD-10-CM

## 2022-06-21 DIAGNOSIS — E039 Hypothyroidism, unspecified: Secondary | ICD-10-CM | POA: Diagnosis not present

## 2022-06-21 DIAGNOSIS — R748 Abnormal levels of other serum enzymes: Secondary | ICD-10-CM

## 2022-06-21 DIAGNOSIS — Z8711 Personal history of peptic ulcer disease: Secondary | ICD-10-CM

## 2022-06-21 LAB — PROTIME-INR
INR: 1 (ref 0.8–1.2)
Prothrombin Time: 13.1 seconds (ref 11.4–15.2)

## 2022-06-21 NOTE — Patient Instructions (Addendum)
Dear Carolyn [name],   Thank you for trusting [place] with your care today.   We discussed many medical problems today.  We will check some blood to evaluate the liver, bruising, and thyroid.  We would like to check a urine. We have ordered an ultrasound to evaluate for a blood clot in the right leg.  We have ordered a bone scan.  Please return in 1 week for a follow up of your remaining medical problems.

## 2022-06-21 NOTE — Progress Notes (Unsigned)
CC: New to establish  HPI:Ms.FELICIDAD HOGELAND is a 67 y.o. female who presents for evaluation of multiple complaints. Please see individual problem based A/P for details.  67 yof with hx of chronic diastolic CHF (EF75%), chronic pancreatitis 2/2 etoh use, , cirrhosis with portal hypertension, prior tib fib fracture, chronic wound, chronic pain - medications include lopressor 25mg  BID, creon , synthroid and liothyronine, dilaudid 2mg  BID and fentanyl patch, lasix 20 daily, gabapentin 300 BID  Does live with supportive SO.  She walks with walker, mostly from broken leg  Secondary pancreatic insufficiency Taking creon.  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HCC) Chronic pain. Rx include Fentanyl patch and oral dilaudid. Most recently filled by Dr. Phillips Odor. Says this was short term to fill gap until she could establish with PCP. She wants [place] to be primary.  We need Utox on her. She said she was not able to provide sample. Will collect at next visit.  History of gastric ulcer On protonix 40mg  BID, carafate  Alkaline phosphatase elevation Elevated alk phos on CMP. She has reasons for this to be GI origin. Also recently broke her leg, so bone origin is also possible. Will need to repeat LFT labs at follow up visit. She needs to be fasting. Would also collect GGT at that time to evaluate for bone etiology.   Pain and swelling of right lower leg Complains of swelling and tenderness of RLE. Has some mild erythema. No fever or chills. Chronic wound on ankle does not appear infected and is being managed by Dr. Lajoyce Corners. Recently treated for cellulitis with Keflex. Lower concern for cellulitis. Would like to obtain lower extremity US to evaluate for DVT.   Alcoholic cirrhosis of liver without ascites No longer drinking. Taking oral lasix 20. Euvolemic. Mentation intact.   Spontaneous bruising Bruising on arms that started in Early march. Bruises will appear and resolve spontaneously. Denies fever,  chills, or itching.  There was concern for easy bleeding/bruising from cirrhosis. PT INR, CBC  were normal. Liver function appears to be largely intact since albumin is only slightly low and only ALP elevated.  Hypothyroidism She has not been taking thyroid meds. [place] Spaniel this was something diagnosed in hospital. Prior checks do seem to line up with hospitalizations. Repeated Tsh and still elevated. Patient will need to resume her therapy. Will need to recheck in future to make sure she is not over medicated.   Acute on chronic diastolic CHF (congestive heart failure) (HCC) EF 70-75%. Cardiac MRI recommended on recent ECHO. Will need to address this at future visit. Euvolemic.   Closed fracture of left tibia and fibula Suspicious for osteoporosis. Will get DEXA. Needs Vit D supplementation.    Depression, PHQ-9: Based on the patients  score we have .  Past Medical History:  Diagnosis Date   Alcoholic cirrhosis    Chronic pancreatitis    Diabetes mellitus without complication    Endometriosis    GERD (gastroesophageal reflux disease)    Intractable nausea and vomiting 06/13/2020   Liver disease    Malabsorption    MALABSORPTION SYNDROME   Osteoporosis 03/2011   t score -2.5   Pancreatitis    due to cyst and tumors due to calcifications   Seizure    no seizure disorder, controlled w/ meds per patient   Vitamin D deficiency 2012   VIT D 15   Review of Systems:   See HPI  Physical Exam: Vitals:   06/21/22 0907  BP: 133/76  Pulse: 100  Temp: 98.6 F (37 C)  TempSrc: Oral  SpO2: 100%  Weight: 108 lb 3.2 oz (49.1 kg)  Height: 5\' 8"  (1.727 m)   General: NAD, appears older than stated age HEENT: Conjunctiva nl , antiicteric sclerae, moist mucous membranes, no exudate or erythema Cardiovascular: Normal rate, regular rhythm.  No murmurs, rubs, or gallops Pulmonary : Equal breath sounds, No wheezes, rales, or rhonchi Abdominal: soft, nontender,  bowel sounds present Ext: No  edema in lower extremities, no tenderness to palpation of lower extremities. Swelling of lower legs R>L. Medial malleolar wounds healing.   Assessment & Plan:   See Encounters Tab for problem based charting.  Patient seen with Dr.  Lafonda Mosses

## 2022-06-21 NOTE — Progress Notes (Signed)
Office Visit Note   Patient: Carolyn [name]           Date of Birth: 1956-03-09           MRN: [id number] Visit Date: 06/15/2022              Requested by: No referring provider defined for this encounter. PCP: Pcp, No  Chief Complaint  Patient presents with   Left Ankle - Follow-up   Right Ankle - Follow-up   Right Knee - Follow-up      HPI: Patient is a 67 year old woman who presents in follow-up for medial malleolar ulcers as well as a right medial knee ulcer secondary to pressure.  Patient has been wearing felt relieving donuts.  Patient went to urgent care on Friday, 06/11/2022 secondary to discolored drainage.  She was started on Cipro 500 mg for 5 days.  Patient states she has bruising on both arms.  Assessment & Plan: Visit Diagnoses:  1. Ankle wound, left, initial encounter   2. Ulcer of right knee, unspecified ulcer stage     Plan: Patient is showing improvement with the felt relieving donut still on low pressure.  We will provide new donuts.  Recommended a baby aspirin daily for DVT prophylaxis.  Follow-Up Instructions: No follow-ups on file.   Ortho Exam  Patient is alert, oriented, no adenopathy, well-dressed, normal affect, normal respiratory effort. Examination patient has bruising on both forearms the medial right knee ulcer has healed.  The medial malleolar ulcer is approximately 50% healed distal calf swelling with some left leg cellulitis.  She will continue with her antibiotics.  Imaging: No results found. No images are attached to the encounter.  Labs: Lab Results  Component Value Date   HGBA1C 5.3 04/07/2022   HGBA1C 4.6 (L) 01/06/2022   ESRSEDRATE 25 (H) 03/15/2022   ESRSEDRATE 25 (H) 03/13/2022   ESRSEDRATE 3 02/18/2022   CRP 1.3 (H) 01/06/2022   REPTSTATUS 03/18/2022 FINAL 03/13/2022   REPTSTATUS 03/18/2022 FINAL 03/13/2022   GRAMSTAIN  01/12/2022    RARE WBC PRESENT,BOTH PMN AND MONONUCLEAR NO ORGANISMS SEEN    CULT  03/13/2022     NO GROWTH 5 DAYS Performed at [facility], [address], [place], [place] [address]    CULT  03/13/2022    NO GROWTH 5 DAYS Performed at [facility], [address] [place], [place] [address]    [facility] STAPHYLOCOCCUS AUREUS 08/25/2006     Lab Results  Component Value Date   ALBUMIN 3.2 (L) 04/21/2022   ALBUMIN 5.3 (H) 04/10/2022   ALBUMIN 5.4 (H) 04/07/2022   PREALBUMIN <5 (L) 03/23/2022    Lab Results  Component Value Date   MG 2.1 04/10/2022   MG 2.4 04/07/2022   MG 2.1 04/06/2022   Lab Results  Component Value Date   VD25OH 44.92 03/14/2022   VD25OH 19.56 (L) 01/09/2022   VD25OH 15 (L) 03/11/2011    Lab Results  Component Value Date   PREALBUMIN <5 (L) 03/23/2022      Latest Ref Rng & Units 05/05/2022    2:05 PM 04/23/2022    8:39 AM 04/23/2022    3:14 AM  CBC EXTENDED  WBC 4.0 - 10.5 K/uL   14.9   RBC 3.87 - 5.11 MIL/uL   2.45   Hemoglobin 12.0 - 15.0 g/dL 14.2  8.4  7.6   HCT 39.5 - 46.0 % 30.0  24.6  23.3   Platelets 150 - 400 K/uL  154      There is no height or weight on file to calculate BMI.  Orders:  No orders of the defined types were placed in this encounter.  No orders of the defined types were placed in this encounter.    Procedures: No procedures performed  Clinical Data: No additional findings.  ROS:  All other systems negative, except as noted in the HPI. Review of Systems  Objective: Vital Signs: LMP 03/10/2009   Specialty Comments:  No specialty comments available.  PMFS History: Patient Active Problem List   Diagnosis Date Noted   Palliative care patient 06/02/2022   Closed fracture of left tibia and fibula 05/05/2022   Displaced comminuted fracture of shaft of left tibia, initial encounter for closed fracture 04/21/2022   Closed fracture of proximal end of left fibula, initial encounter 04/21/2022   Leukocytosis 04/21/2022   Influenza A 04/06/2022   History of gastric ulcer 03/23/2022   Abnormal  loss of weight 03/23/2022   Aspiration pneumonia of both lungs 03/23/2022   Acute on chronic diastolic CHF (congestive heart failure) 03/23/2022   NSVT (nonsustained ventricular tachycardia) 03/20/2022   Localized swelling of right upper extremity 03/19/2022   Cellulitis of left lower extremity 03/17/2022   Hypophosphatemia 03/17/2022   Anemia of chronic disease 03/17/2022   Sepsis 03/14/2022   Severe sepsis 03/13/2022   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   Abnormal CT of the abdomen 01/19/2022   Chronic gastric ulcer with perforation 01/19/2022   Alcoholic cirrhosis of liver without ascites 01/18/2022   Occult blood in stools 01/18/2022   Portal hypertensive gastropathy 01/18/2022   Anasarca 01/12/2022   Acute respiratory failure with hypoxia 01/11/2022   Hypoalbuminemia 01/11/2022   Pleural effusion 01/10/2022   Abdominal distention 01/10/2022   Physical deconditioning 01/09/2022   Pressure injury of skin 01/08/2022   Hypokalemia 01/07/2022   Hypomagnesemia 01/07/2022   AKI (acute kidney injury) 01/07/2022   Severe protein-calorie malnutrition 01/07/2022   Cellulitis of right foot 01/07/2022   Normocytic anemia 01/07/2022   Osteomyelitis 01/06/2022   Intractable nausea and vomiting 06/13/2020   Metabolic acidosis, increased anion gap 06/12/2020   Seizure (HCC) 05/08/2017   Chronic pain syndrome 05/08/2017   Gastroesophageal reflux disease without esophagitis 05/08/2017   Chronic alcoholic pancreatitis 05/08/2017   Pancreatic steatorrhea 05/08/2017   Osteoporosis 03/16/2011   Endometriosis    Malabsorption    Chronic pancreatitis    Vitamin D deficiency    Past Medical History:  Diagnosis Date   Alcoholic cirrhosis    Chronic pancreatitis    Diabetes mellitus without complication    Endometriosis    GERD (gastroesophageal reflux disease)    Intractable nausea and vomiting 06/13/2020   Liver disease    Malabsorption    MALABSORPTION SYNDROME   Osteoporosis  03/2011   t score -2.5   Pancreatitis    due to cyst and tumors due to calcifications   Seizure    no seizure disorder, controlled w/ meds per patient   Vitamin D deficiency 2012   VIT D 15    Family History  Problem Relation Age of Onset   Cancer Father        BONE   Breast cancer Maternal Grandmother     Past Surgical History:  Procedure Laterality Date   APPENDECTOMY     BIOPSY  01/19/2022   Procedure: BIOPSY;  Surgeon: Shellia Cleverly, DO;  Location: [facility];  Service: Gastroenterology;;   CHOLECYSTECTOMY     ESOPHAGOGASTRODUODENOSCOPY (EGD) WITH PROPOFOL  N/A 01/19/2022   Procedure: ESOPHAGOGASTRODUODENOSCOPY (EGD) WITH PROPOFOL;  Surgeon: Shellia Cleverlyirigliano, Vito V, DO;  Location: [facility];  Service: Gastroenterology;  Laterality: N/A;   FEEDING TUBE RELOCATION  2010   JEJUNOSTOMY FEEDING TUBE  1990   MULTIPLE GASTRIC SURGERIES     MYOMECTOMY     OPEN REDUCTION INTERNAL FIXATION (ORIF) DISTAL RADIAL FRACTURE Left 06/29/2018   Procedure: OPEN REDUCTION INTERNAL FIXATION (ORIF)LEFT  DISTAL RADIAL FRACTURE;  Surgeon: Betha LoaKuzma, Kevin, MD;  Location: [facility];  Service: Orthopedics;  Laterality: Left;   ROUX-EN-Y GASTRIC BYPASS     TIBIA IM NAIL INSERTION Left 04/21/2022   Procedure: LEFT INTRAMEDULLARY (IM) NAIL TIBIAL;  Surgeon: Eldred MangesYates, Mark C, MD;  Location: [facility];  Service: Orthopedics;  Laterality: Left;   TIBIA IM NAIL INSERTION Left 05/05/2022   Procedure: LEFT TIBIAL NAIL DISTAL SCREW PLACEMENT;  Surgeon: Eldred MangesYates, Mark C, MD;  Location: [facility];  Service: Orthopedics;  Laterality: Left;   TUBAL LIGATION     Social History   Occupational History   Not on file  Tobacco Use   Smoking status: Never   Smokeless tobacco: Never  Vaping Use   Vaping Use: Never used  Substance and Sexual Activity   Alcohol use: Yes    Comment: occ ( none in several weeks)   Drug use: No   Sexual activity: Not Currently    Partners: Male    Birth control/protection:  Post-menopausal

## 2022-06-22 ENCOUNTER — Encounter: Payer: Medicare PPO | Admitting: Physical Therapy

## 2022-06-22 ENCOUNTER — Ambulatory Visit (INDEPENDENT_AMBULATORY_CARE_PROVIDER_SITE_OTHER): Payer: Medicare PPO | Admitting: Orthopaedic Surgery

## 2022-06-22 ENCOUNTER — Other Ambulatory Visit (INDEPENDENT_AMBULATORY_CARE_PROVIDER_SITE_OTHER): Payer: Medicare PPO

## 2022-06-22 ENCOUNTER — Encounter: Payer: Self-pay | Admitting: Orthopaedic Surgery

## 2022-06-22 VITALS — BP 144/76 | HR 109 | Ht 68.0 in | Wt 108.0 lb

## 2022-06-22 DIAGNOSIS — S82202P Unspecified fracture of shaft of left tibia, subsequent encounter for closed fracture with malunion: Secondary | ICD-10-CM

## 2022-06-22 DIAGNOSIS — M79672 Pain in left foot: Secondary | ICD-10-CM

## 2022-06-22 DIAGNOSIS — K8689 Other specified diseases of pancreas: Secondary | ICD-10-CM | POA: Insufficient documentation

## 2022-06-22 DIAGNOSIS — S82402P Unspecified fracture of shaft of left fibula, subsequent encounter for closed fracture with malunion: Secondary | ICD-10-CM

## 2022-06-22 LAB — CBC WITH DIFFERENTIAL/PLATELET
Basophils Absolute: 0.1 10*3/uL (ref 0.0–0.2)
Basos: 1 %
EOS (ABSOLUTE): 0.2 10*3/uL (ref 0.0–0.4)
Eos: 2 %
Hematocrit: 38.2 % (ref 34.0–46.6)
Hemoglobin: 12.5 g/dL (ref 11.1–15.9)
Immature Grans (Abs): 0 10*3/uL (ref 0.0–0.1)
Immature Granulocytes: 0 %
Lymphocytes Absolute: 2.4 10*3/uL (ref 0.7–3.1)
Lymphs: 25 %
MCH: 29.8 pg (ref 26.6–33.0)
MCHC: 32.7 g/dL (ref 31.5–35.7)
MCV: 91 fL (ref 79–97)
Monocytes Absolute: 0.7 10*3/uL (ref 0.1–0.9)
Monocytes: 7 %
Neutrophils Absolute: 6.1 10*3/uL (ref 1.4–7.0)
Neutrophils: 65 %
Platelets: 318 10*3/uL (ref 150–450)
RBC: 4.19 x10E6/uL (ref 3.77–5.28)
RDW: 13.3 % (ref 11.7–15.4)
WBC: 9.5 10*3/uL (ref 3.4–10.8)

## 2022-06-22 LAB — CMP14 + ANION GAP
ALT: 19 IU/L (ref 0–32)
AST: 35 IU/L (ref 0–40)
Albumin/Globulin Ratio: 1.3 (ref 1.2–2.2)
Albumin: 3.7 g/dL — ABNORMAL LOW (ref 3.9–4.9)
Alkaline Phosphatase: 293 IU/L — ABNORMAL HIGH (ref 44–121)
Anion Gap: 16 mmol/L (ref 10.0–18.0)
BUN/Creatinine Ratio: 36 — ABNORMAL HIGH (ref 12–28)
BUN: 29 mg/dL — ABNORMAL HIGH (ref 8–27)
Bilirubin Total: 0.3 mg/dL (ref 0.0–1.2)
CO2: 30 mmol/L — ABNORMAL HIGH (ref 20–29)
Calcium: 9.2 mg/dL (ref 8.7–10.3)
Chloride: 95 mmol/L — ABNORMAL LOW (ref 96–106)
Creatinine, Ser: 0.81 mg/dL (ref 0.57–1.00)
Globulin, Total: 2.9 g/dL (ref 1.5–4.5)
Glucose: 78 mg/dL (ref 70–99)
Potassium: 3.4 mmol/L — ABNORMAL LOW (ref 3.5–5.2)
Sodium: 141 mmol/L (ref 134–144)
Total Protein: 6.6 g/dL (ref 6.0–8.5)
eGFR: 81 mL/min/{1.73_m2} (ref >59)

## 2022-06-22 LAB — TSH: TSH: 7.41 u[IU]/mL — ABNORMAL HIGH (ref 0.450–4.500)

## 2022-06-22 NOTE — Assessment & Plan Note (Signed)
Pancreatic insufficiency secondary to chronic pancreatitis from [place].

## 2022-06-22 NOTE — Progress Notes (Signed)
Post-Op Visit Note   Patient: Carolyn [name]           Date of Birth: 01-15-1956           MRN: [id number] Visit Date: 06/22/2022 PCP: Adron Bene, MD   Assessment & Plan: Follow-up tibial nail.  All incision sites look good.  Dr. Lajoyce Corners is treating her leg ulcers.  She has some pitting edema we discussed using the alpaca socks to help with compression.  She is using protein supplements for low protein.  Long history of pancreatitis she remains on Duragesic patches and recently got another prescription for Dilaudid 10 mg.  She can discontinue the boot we discussed she can walk is much as she like.  To the x-rays today shows progressive healing of the fracture and she does not need the boot any longer.  She can progress with weightbearing as tolerated.  Chief Complaint:  Chief Complaint  Patient presents with   Left Leg - Follow-up, Routine Post Op    05/05/2022 Left tibial nail distal screw placement 04/21/2022 Left IM nail tibia   Left Heel - Pain   Visit Diagnoses:  1. Closed fracture of left tibia and fibula with malunion, subsequent encounter   2. Pain of left heel     Plan: She can return to see me on an as-needed basis.  Tibia fracture is healed adequately enough to discontinue boot and weight-bear as tolerated.  She will continue to work on therapy with strengthening as well as nourishment.  Follow-Up Instructions: Return if symptoms worsen or fail to improve.   Orders:  Orders Placed This Encounter  Procedures   XR Tibia/Fibula Left   XR Foot 2 Views Left   No orders of the defined types were placed in this encounter.   Imaging: XR Tibia/Fibula Left  Result Date: 06/22/2022 AP lateral tib-fib x-rays demonstrate tibial nail with proximal distal interlock no loosening of the single distal anterior to posterior interlock.  Oblique shaft fracture has progressive callus formation bridging and areas of the fracture bridged with few lucent area or zones left. Impression:  Tib-fib fracture with progressive healing proximal fibula and tibial shaft fracture with IM nail with interlock.  XR Foot 2 Views Left  Result Date: 06/22/2022 Three-view x-rays left foot obtained and reviewed negative for calcaneus fracture no subtalar or ankle arthrosis. Impression: Unremarkable left foot radiographs.   PMFS History: Patient Active Problem List   Diagnosis Date Noted   Palliative care patient 06/02/2022   Closed fracture of left tibia and fibula 05/05/2022   Displaced comminuted fracture of shaft of left tibia, initial encounter for closed fracture 04/21/2022   Closed fracture of proximal end of left fibula, initial encounter 04/21/2022   Leukocytosis 04/21/2022   History of gastric ulcer 03/23/2022   Abnormal loss of weight 03/23/2022   Aspiration pneumonia of both lungs 03/23/2022   Acute on chronic diastolic CHF (congestive heart failure) 03/23/2022   NSVT (nonsustained ventricular tachycardia) 03/20/2022   Localized swelling of right upper extremity 03/19/2022   Cellulitis of left lower extremity 03/17/2022   Hypophosphatemia 03/17/2022   Anemia of chronic disease 03/17/2022   Sepsis 03/14/2022   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   Abnormal CT of the abdomen 01/19/2022   Chronic gastric ulcer with perforation 01/19/2022   Alcoholic cirrhosis of liver without ascites 01/18/2022   Occult blood in stools 01/18/2022   Portal hypertensive gastropathy 01/18/2022   Anasarca 01/12/2022   Hypoalbuminemia 01/11/2022   Pleural effusion 01/10/2022  Abdominal distention 01/10/2022   Physical deconditioning 01/09/2022   Pressure injury of skin 01/08/2022   Severe protein-calorie malnutrition 01/07/2022   Cellulitis of right foot 01/07/2022   Normocytic anemia 01/07/2022   Osteomyelitis 01/06/2022   Seizure (HCC) 05/08/2017   Chronic pain syndrome 05/08/2017   Gastroesophageal reflux disease without esophagitis 05/08/2017   Chronic alcoholic pancreatitis 05/08/2017    Pancreatic steatorrhea 05/08/2017   Osteoporosis 03/16/2011   Endometriosis    Malabsorption    Chronic pancreatitis    Vitamin D deficiency    Past Medical History:  Diagnosis Date   Alcoholic cirrhosis    Chronic pancreatitis    Diabetes mellitus without complication    Endometriosis    GERD (gastroesophageal reflux disease)    Intractable nausea and vomiting 06/13/2020   Liver disease    Malabsorption    MALABSORPTION SYNDROME   Osteoporosis 03/2011   t score -2.5   Pancreatitis    due to cyst and tumors due to calcifications   Seizure    no seizure disorder, controlled w/ meds per patient   Vitamin D deficiency 2012   VIT D 15    Family History  Problem Relation Age of Onset   Cancer Father        BONE   Breast cancer Maternal Grandmother     Past Surgical History:  Procedure Laterality Date   APPENDECTOMY     BIOPSY  01/19/2022   Procedure: BIOPSY;  Surgeon: Shellia Cleverly, DO;  Location: [facility];  Service: Gastroenterology;;   CHOLECYSTECTOMY     ESOPHAGOGASTRODUODENOSCOPY (EGD) WITH PROPOFOL N/A 01/19/2022   Procedure: ESOPHAGOGASTRODUODENOSCOPY (EGD) WITH PROPOFOL;  Surgeon: Shellia Cleverly, DO;  Location: [facility];  Service: Gastroenterology;  Laterality: N/A;   FEEDING TUBE RELOCATION  2010   JEJUNOSTOMY FEEDING TUBE  1990   MULTIPLE GASTRIC SURGERIES     MYOMECTOMY     OPEN REDUCTION INTERNAL FIXATION (ORIF) DISTAL RADIAL FRACTURE Left 06/29/2018   Procedure: OPEN REDUCTION INTERNAL FIXATION (ORIF)LEFT  DISTAL RADIAL FRACTURE;  Surgeon: Betha Loa, MD;  Location: [facility];  Service: Orthopedics;  Laterality: Left;   ROUX-EN-Y GASTRIC BYPASS     TIBIA IM NAIL INSERTION Left 04/21/2022   Procedure: LEFT INTRAMEDULLARY (IM) NAIL TIBIAL;  Surgeon: Eldred Manges, MD;  Location: [facility];  Service: Orthopedics;  Laterality: Left;   TIBIA IM NAIL INSERTION Left 05/05/2022   Procedure: LEFT TIBIAL NAIL DISTAL SCREW PLACEMENT;   Surgeon: Eldred Manges, MD;  Location: [facility];  Service: Orthopedics;  Laterality: Left;   TUBAL LIGATION     Social History   Occupational History   Not on file  Tobacco Use   Smoking status: Never   Smokeless tobacco: Never  Vaping Use   Vaping Use: Never used  Substance and Sexual Activity   Alcohol use: Yes    Comment: occ ( none in several weeks)   Drug use: No   Sexual activity: Not Currently    Partners: Male    Birth control/protection: Post-menopausal

## 2022-06-23 ENCOUNTER — Telehealth: Payer: Self-pay

## 2022-06-23 ENCOUNTER — Encounter: Payer: Self-pay | Admitting: Internal Medicine

## 2022-06-23 DIAGNOSIS — R233 Spontaneous ecchymoses: Secondary | ICD-10-CM | POA: Insufficient documentation

## 2022-06-23 DIAGNOSIS — E039 Hypothyroidism, unspecified: Secondary | ICD-10-CM | POA: Insufficient documentation

## 2022-06-23 DIAGNOSIS — M7989 Other specified soft tissue disorders: Secondary | ICD-10-CM | POA: Insufficient documentation

## 2022-06-23 DIAGNOSIS — M79661 Pain in right lower leg: Secondary | ICD-10-CM | POA: Insufficient documentation

## 2022-06-23 DIAGNOSIS — R748 Abnormal levels of other serum enzymes: Secondary | ICD-10-CM | POA: Insufficient documentation

## 2022-06-23 NOTE — Assessment & Plan Note (Signed)
She has not been taking thyroid meds. [place] Spaniel this was something diagnosed in hospital. Prior checks do seem to line up with hospitalizations. Repeated Tsh and still elevated. Patient will need to resume her therapy. Will need to recheck in future to make sure she is not over medicated.

## 2022-06-23 NOTE — Telephone Encounter (Signed)
Patient called regarding her last visit with you, she stated you guys discussed her getting a referral for a bone density scan and a mri of her right leg. Please place the referrals.

## 2022-06-23 NOTE — Assessment & Plan Note (Signed)
>>  ASSESSMENT AND PLAN FOR HISTORY OF GASTRIC ULCER WRITTEN ON 06/23/2022  7:19 AM BY Adron Bene, MD  On protonix 40mg  BID, carafate

## 2022-06-23 NOTE — Telephone Encounter (Signed)
We had discussed [place] of LE to evaluate for DVT and DEXA scan. Both have been ordered. I will call her when able to clarify

## 2022-06-23 NOTE — Assessment & Plan Note (Signed)
Complains of swelling and tenderness of RLE. Has some mild erythema. No fever or chills. Chronic wound on ankle does not appear infected and is being managed by Dr. Lajoyce Corners. Recently treated for cellulitis with Keflex. Lower concern for cellulitis. Would like to obtain lower extremity US to evaluate for DVT.

## 2022-06-23 NOTE — Assessment & Plan Note (Signed)
On protonix 40mg  BID, carafate

## 2022-06-23 NOTE — Assessment & Plan Note (Signed)
Elevated alk phos on CMP. She has reasons for this to be GI origin. Also recently broke her leg, so bone origin is also possible. Will need to repeat LFT labs at follow up visit. She needs to be fasting. Would also collect GGT at that time to evaluate for bone etiology.

## 2022-06-23 NOTE — Assessment & Plan Note (Signed)
Suspicious for osteoporosis. Will get DEXA. Needs Vit D supplementation.

## 2022-06-23 NOTE — Assessment & Plan Note (Addendum)
>>  ASSESSMENT AND PLAN FOR OSTEOPOROSIS WRITTEN ON 07/02/2022  3:13 PM BY Adron Bene, MD  >>ASSESSMENT AND PLAN FOR CLOSED FRACTURE OF LEFT TIBIA AND FIBULA WRITTEN ON 06/23/2022  7:46 AM BY Adron Bene, MD  Suspicious for osteoporosis. Will get DEXA. Needs Vit D supplementation.   >>ASSESSMENT AND PLAN FOR ALKALINE PHOSPHATASE ELEVATION WRITTEN ON 06/23/2022  7:29 AM BY Adron Bene, MD  Elevated alk phos on CMP. She has reasons for this to be GI origin. Also recently broke her leg, so bone origin is also possible. Will need to repeat LFT labs at follow up visit. She needs to be fasting. Would also collect GGT at that time to evaluate for bone etiology.

## 2022-06-23 NOTE — Assessment & Plan Note (Addendum)
Bruising on arms that started in Early march. Bruises will appear and resolve spontaneously. Denies fever, chills, or itching.  There was concern for easy bleeding/bruising from cirrhosis. PT INR, CBC  were normal. Liver function appears to be largely intact since albumin is only slightly low and only ALP elevated.

## 2022-06-23 NOTE — Assessment & Plan Note (Signed)
EF 70-75%. Cardiac MRI recommended on recent ECHO. Will need to address this at future visit. Euvolemic.

## 2022-06-23 NOTE — Assessment & Plan Note (Signed)
Taking creon.

## 2022-06-23 NOTE — Assessment & Plan Note (Signed)
Chronic pain. Rx include Fentanyl patch and oral dilaudid. Most recently filled by Dr. Phillips Odor. Says this was short term to fill gap until she could establish with PCP. She wants [place] to be primary.  We need Utox on her. She said she was not able to provide sample. Will collect at next visit.

## 2022-06-23 NOTE — Assessment & Plan Note (Signed)
No longer drinking. Taking oral lasix 20. Euvolemic. Mentation intact.

## 2022-06-24 ENCOUNTER — Encounter: Payer: Self-pay | Admitting: Physical Therapy

## 2022-06-24 ENCOUNTER — Ambulatory Visit: Payer: Medicare PPO | Admitting: Physical Therapy

## 2022-06-24 ENCOUNTER — Other Ambulatory Visit: Payer: Self-pay | Admitting: Internal Medicine

## 2022-06-24 DIAGNOSIS — M25562 Pain in left knee: Secondary | ICD-10-CM | POA: Diagnosis not present

## 2022-06-24 DIAGNOSIS — M25672 Stiffness of left ankle, not elsewhere classified: Secondary | ICD-10-CM

## 2022-06-24 DIAGNOSIS — M25572 Pain in left ankle and joints of left foot: Secondary | ICD-10-CM

## 2022-06-24 DIAGNOSIS — R600 Localized edema: Secondary | ICD-10-CM

## 2022-06-24 DIAGNOSIS — M6281 Muscle weakness (generalized): Secondary | ICD-10-CM

## 2022-06-24 DIAGNOSIS — R2681 Unsteadiness on feet: Secondary | ICD-10-CM

## 2022-06-24 DIAGNOSIS — R2689 Other abnormalities of gait and mobility: Secondary | ICD-10-CM

## 2022-06-24 NOTE — Progress Notes (Signed)
Would restart just the synthroid at . Will titrate this based on response.  Recheck ALP at next visit and check GGT at that time too. Patient instructed to fast prior to labs.

## 2022-06-24 NOTE — Therapy (Signed)
OUTPATIENT PHYSICAL THERAPY LOWER EXTREMITY TREATMENT   Patient Name: Carolyn [name] MRN: [id number] DOB:1955/07/14, 67 y.o., female Today's Date: 06/24/2022   END OF SESSION:  PT End of Session - 06/24/22 0852     Visit Number 11    Number of Visits 30    Date for PT Re-Evaluation 08/13/22    Authorization Type [employer] PPO    Authorization Time Period $20 COPAY Approved  Authorization #[id number]  Tracking #[id number] PT VISITS 3/4-5/31/2024    Authorization - Visit Number 11    Authorization - Number of Visits 12    Progress Note Due on Visit 20    PT Start Time 0845    PT Stop Time 0930    PT Time Calculation (min) 45 min    Equipment Utilized During Treatment Gait belt    Activity Tolerance Patient tolerated treatment well    Behavior During Therapy WFL for tasks assessed/performed                   Past Medical History:  Diagnosis Date   Alcoholic cirrhosis    Chronic pancreatitis    Diabetes mellitus without complication    Endometriosis    GERD (gastroesophageal reflux disease)    Intractable nausea and vomiting 06/13/2020   Liver disease    Malabsorption    MALABSORPTION SYNDROME   Osteoporosis 03/2011   t score -2.5   Pancreatitis    due to cyst and tumors due to calcifications   Seizure    no seizure disorder, controlled w/ meds per patient   Vitamin D deficiency 2012   VIT D 15   Past Surgical History:  Procedure Laterality Date   APPENDECTOMY     BIOPSY  01/19/2022   Procedure: BIOPSY;  Surgeon: Shellia Cleverly, DO;  Location: [facility];  Service: Gastroenterology;;   CHOLECYSTECTOMY     ESOPHAGOGASTRODUODENOSCOPY (EGD) WITH PROPOFOL N/A 01/19/2022   Procedure: ESOPHAGOGASTRODUODENOSCOPY (EGD) WITH PROPOFOL;  Surgeon: Shellia Cleverly, DO;  Location: [facility];  Service: Gastroenterology;  Laterality: N/A;   FEEDING TUBE RELOCATION  2010   JEJUNOSTOMY FEEDING TUBE  1990   MULTIPLE GASTRIC SURGERIES      MYOMECTOMY     OPEN REDUCTION INTERNAL FIXATION (ORIF) DISTAL RADIAL FRACTURE Left 06/29/2018   Procedure: OPEN REDUCTION INTERNAL FIXATION (ORIF)LEFT  DISTAL RADIAL FRACTURE;  Surgeon: Betha Loa, MD;  Location: [facility];  Service: Orthopedics;  Laterality: Left;   ROUX-EN-Y GASTRIC BYPASS     TIBIA IM NAIL INSERTION Left 04/21/2022   Procedure: LEFT INTRAMEDULLARY (IM) NAIL TIBIAL;  Surgeon: Eldred Manges, MD;  Location: [facility];  Service: Orthopedics;  Laterality: Left;   TIBIA IM NAIL INSERTION Left 05/05/2022   Procedure: LEFT TIBIAL NAIL DISTAL SCREW PLACEMENT;  Surgeon: Eldred Manges, MD;  Location: [facility];  Service: Orthopedics;  Laterality: Left;   TUBAL LIGATION     Patient Active Problem List   Diagnosis Date Noted   Alkaline phosphatase elevation 06/23/2022   Pain and swelling of right lower leg 06/23/2022   Spontaneous bruising 06/23/2022   Hypothyroidism 06/23/2022   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   Palliative care patient 06/02/2022   Closed fracture of left tibia and fibula 05/05/2022   Displaced comminuted fracture of shaft of left tibia, initial encounter for closed fracture 04/21/2022   Closed fracture of proximal end of left fibula, initial encounter 04/21/2022   History of gastric ulcer 03/23/2022   Abnormal loss of weight  03/23/2022   Acute on chronic diastolic CHF (congestive heart failure) 03/23/2022   Cellulitis of left lower extremity 03/17/2022   Anemia of chronic disease 03/17/2022   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   Abnormal CT of the abdomen 01/19/2022   Chronic gastric ulcer with perforation 01/19/2022   Alcoholic cirrhosis of liver without ascites 01/18/2022   Occult blood in stools 01/18/2022   Portal hypertensive gastropathy 01/18/2022   Hypoalbuminemia 01/11/2022   Physical deconditioning 01/09/2022   Pressure injury of skin 01/08/2022   Severe protein-calorie malnutrition 01/07/2022   Normocytic anemia 01/07/2022    Osteomyelitis 01/06/2022   Seizure (HCC) 05/08/2017   Chronic pain syndrome 05/08/2017   Gastroesophageal reflux disease without esophagitis 05/08/2017   Chronic alcoholic pancreatitis 05/08/2017   Pancreatic steatorrhea 05/08/2017   Osteoporosis 03/16/2011   Malabsorption    Chronic pancreatitis    Vitamin D deficiency     PCP: General PCP   REFERRING PROVIDER:  Eldred Manges, MD   REFERRING DIAG:  S82.252A (ICD-10-CM) - Displaced comminuted fracture of shaft of left tibia, initial encounter for closed fracture  S82.832A (ICD-10-CM) - Closed fracture of proximal end of left fibula, initial encounter    THERAPY DIAG:  Muscle weakness (generalized)  Acute pain of left knee  Pain in left ankle and joints of left foot  Stiffness of left ankle, not elsewhere classified  Other abnormalities of gait and mobility  Localized edema  Unsteadiness on feet  Rationale for Evaluation and Treatment: Rehabilitation  ONSET DATE: 04/21/2022, 1st ankle sg, 05/05/2022 2nd surgery  SUBJECTIVE:   SUBJECTIVE STATEMENT: Dr. Ophelia Charter said she could stop using CAM boot now.  She has not heard back from PCP about [place] yet.    PERTINENT HISTORY: DM, Alcoholism in remission, chronic pancreatitis, hepatic cirrhosis, She had prolonged hospitalization 03/13/2022-1/29/204 with severe sepsis included hypoxic respiratory failure.   PAIN: NPRS scale: today left knee 2/10 & ankle 2/10 and since last PT lowest knee 2/10, ankle 2/10  & highest knee 4/10 ankle 4/10 Pain location: left knee more patella & ankle more medially Pain description: ache,  Aggravating factors:  meds wearing off, over night with increased in morning Relieving factors: meds, elevating & not moving knee / ankle  PRECAUTIONS: Fall  WEIGHT BEARING RESTRICTIONS: Yes LLE WBAT as of 05/18/2022  FALLS:  Has patient fallen in last 6 months? Yes. Number of falls 1  LIVING ENVIRONMENT: Lives with: lives with their spouse and 3 dogs 29, 16 &  50# Lives in: House single story  Stairs: Yes: External: 6 (long width)  steps; on right going up temporary ramp primary entrance Has following equipment at home: Dan Humphreys - 2 wheeled, Wheelchair (manual), Shower bench, bed side commode, Grab bars, and Ramped entry  OCCUPATION: retired   PLOF: Independent  PATIENT GOALS:   get back to function in house & access community  Next MD visit:  05/18/2022  OBJECTIVE:   DIAGNOSTIC FINDINGS: 05/11/2022: Post distal tibial screw placement.  AP knee AP ankle appear to be in good rotational alignment.   PATIENT SURVEYS:  05/17/2022 / Evaluation:  FOTO intake:  34%  predicted:  47%  COGNITION: 05/17/2022 / Evaluation:   Overall cognitive status: WFL    SENSATION: 05/17/2022 / Evaluation:   WFL for knee area but unable to test ankle as wrapped for wound  EDEMA:  05/17/2022 / Evaluation:  left foot & ankle appear edematous but wrapped due to wound.  POSTURE:  05/17/2022 / Evaluation:  rounded shoulders, forward head, increased  thoracic kyphosis, flexed trunk , and weight shift right  PALPATION: 05/17/2022 / Evaluation: not tested due to wrapped for wound  LOWER EXTREMITY ROM:   ROM Left Eval 05/17/22 Left 05/26/22 Left 06/16/22  Hip flexion     Hip extension     Hip abduction     Hip adduction     Hip internal rotation     Hip external rotation     Knee flexion     Knee extension Seated A: LAQ -22* Seated  A: LAQ -11* Seated  A: LAQ -9*  Ankle dorsiflexion  Seated knee flexed A: -3* P: +2*  Seated knee flexed A: +3* P: +6*  Ankle plantarflexion  P & A 19* P & A 21*  Ankle inversion     Ankle eversion     Ankle Not tested at evaluation due to wound with bandages.  (Blank rows = not tested)  LOWER EXTREMITY MMT:  MMT Right eval Left eval  Hip flexion    Hip extension    Hip abduction    Hip adduction    Hip internal rotation    Hip external rotation    Knee flexion  3-/5  Knee extension  3-/5  Ankle dorsiflexion    Ankle  plantarflexion    Ankle inversion    Ankle eversion    Ankle Not tested at evaluation due to Toe touch Weightbearing status.  (Blank rows = not tested)   FUNCTIONAL TESTS:  05/17/2022 / Evaluation: 18 inch chair transfer:  requires armrests or RW to push up to arise.  Requires RW support for stabilization  GAIT: 05/17/2022 / Evaluation: Distance walked: 30' Assistive device utilized: Environmental consultant - 2 wheeled Level of assistance: SBA Comments: NWB LLE.  Turns with pivoting on foot incorrectly.    TODAY'S TREATMENT                                                                          DATE: 06/24/2022: Therapeutic Exercise: Leg press BLEs 75# 15 reps, 100# 8 reps, 81# 15 reps;  single leg 31# LLE 5 reps & RLE 10 reps 2 sets Braiding //bars  BUE 3 laps BAPS standing //bars LLE CW & CCW 5 reps 2 sets Sit to stand using single UE (unable without UE) and stand to sit with light touch 5 reps ea UE for 10 reps  Self-Care: PT educated verbally on signs/symptoms of DVT & PE including need to call "911" pt verbalized understanding.  Gait Training: PT demo & verbal cues on cane use. Pt amb 75' X 2 with cane stand alone tip with CGA & verbal cues for technique.PT educated pt & partner in Social worker and need for assistance. Recommending only in home with assistance for now. Outside use RW &  slow down to control ankle on uneven surfaces.  Pt & partner verbalized understanding.    06/16/2022: PT working with left shoe instead of CAM boot in clinic only. Pt reports no change in pain  Therapeutic Exercise: Precor recumbent bike seat 6 with BLEs level 4 for 8 min Leg press BLEs 100# 15 reps 2 sets;  single leg 31# LLE 6 reps & RLE 10 reps 2 sets Seated hamstring stretch with strap 30  sec hold 2 reps See ROM  Neuromuscular Re-educatoin: Counter BUE support: braiding 3 laps RW support tandem gait 10' X 6 RW support marching 25'   Gait Training: Pt amb with RW 100' X 2 with shoe LLE instead of CAM  boot with WBAT supervision. No change in pain. PT continues to remind pt to use RW until MD appt & clearance for increasing weight bearing.     06/14/2022: PT working with left shoe instead of CAM boot in clinic only. Pt reports no change in pain  Therapeutic Exercise: Precor recumbent bike seat 6 with BLEs level 4 for 8 min Leg press BLEs 100# 15 reps 1 sets & 11 reps 2nd set due to fatigue;  single leg 31# LLE 5 reps & RLE 10 reps 2 sets Standing with RW support red theraband kicks abd, ext, add & flex 10 reps ea motion BLEs.  PT cues & supervsion.   Gait Training: Pt amb with RW 100' X 2 with shoe LLE instead of CAM boot with WBAT supervision. No change in pain. Pt amb up / down 12* incline with RW WBAT with supervision. Pt neg stairs with 2 rails with alternating pattern with supervision. PT cues.     PATIENT EDUCATION:  Education details: HEP, POC Person educated: Patient Education method: Programmer, multimedia, Demonstration, Verbal cues, and Handouts Education comprehension: verbalized understanding, returned demonstration, and verbal cues required  HOME EXERCISE PROGRAM: Access Code: [email or web address] URL: [email or web address] Date: 06/09/2022 Prepared by: Vladimir Faster  Exercises - seated marching  - 2 x daily - 7 x weekly - 3 sets - 10 reps - 5 seconds hold - Seated Hip Abduction  - 2 x daily - 7 x weekly - 3 sets - 10 reps - 5 seconds hold - Seated Hamstring Stretch  - 2 x daily - 7 x weekly - 1 sets - 3 reps - 20-30 seconds hold - Seated Ankle Pumps  - 2 x daily - 7 x weekly - 3 sets - 10 reps - 5 seconds hold - Seated Ankle Circles  - 2 x daily - 7 x weekly - 3 sets - 10 reps - Seated Leg Press with Resistance  - 1 x daily - 7 x weekly - 2 sets - 10 reps - 5 seconds hold - Seated Ankle Plantarflexion with Resistance  - 1 x daily - 7 x weekly - 2 sets - 10 reps - 5 seconds hold - Ankle Dorsiflexion with Resistance  - 1 x daily - 7 x weekly - 2 sets - 10 reps - 5 seconds  hold - Seated Figure 4 Ankle Inversion with Resistance  - 1 x daily - 7 x weekly - 2 sets - 10 reps - 5 seconds hold - Seated Ankle Eversion with Anchored Resistance  - 1 x daily - 7 x weekly - 1 sets - 10 reps - 5 seconds hold - Standing Gastroc Stretch on Step  - 1 x daily - 7 x weekly - 1 sets - 2-3 reps - 30 seconds hold - Standing Soleus Stretch on Step  - 1 x daily - 7 x weekly - 1 sets - 2-3 reps - 30 seconds hold  ASSESSMENT:  CLINICAL IMPRESSION: Patient tolerated increased weight bearing activities today.  PT introduced gait with cane today but she needs further training due to balance & coordination.   Patient continues to benefit from skilled PT.  OBJECTIVE IMPAIRMENTS: Abnormal gait, cardiopulmonary status limiting activity, decreased activity tolerance, decreased balance, decreased coordination, decreased  endurance, decreased knowledge of condition, decreased knowledge of use of DME, decreased mobility, difficulty walking, decreased ROM, decreased strength, increased edema, increased muscle spasms, impaired flexibility, impaired sensation, postural dysfunction, and pain.   ACTIVITY LIMITATIONS: carrying, lifting, bending, standing, squatting, sleeping, stairs, transfers, reach over head, and locomotion level  PARTICIPATION LIMITATIONS: meal prep, cleaning, driving, and community activity  PERSONAL FACTORS: Fitness, Time since onset of injury/illness/exacerbation, and 3+ comorbidities: see PMH  are also affecting patient's functional outcome.   REHAB POTENTIAL: Good  CLINICAL DECISION MAKING: Stable/uncomplicated  EVALUATION COMPLEXITY: Low   GOALS: Goals reviewed with patient? Yes  SHORT TERM GOALS: (target date for Short term goals are 30 days 06/16/2022)   1.  Patient will demonstrate independent use of home exercise program to maintain progress from in clinic treatments.  Goal status: MET 06/14/2022  2. Patient amb 975' with RW with TDWBing LLE (or higher if MD permits)  with supervision.  Goal Status: MET 06/14/2022  LONG TERM GOALS: (target dates for all long term goals are 90 days 08/13/2022)   1. Patient will demonstrate/report pain at worst less than or equal to 2/10 to facilitate minimal limitation in daily activity secondary to pain symptoms. Goal status: New   2. Patient will demonstrate independent use of home exercise program to facilitate ability to maintain/progress functional gains from skilled physical therapy services. Goal status: New   3. Patient will demonstrate FOTO outcome > or = 47 % to indicate reduced disability due to condition.  Goal status: New   4.  Patient will demonstrate LLE MMT 5/5 throughout to faciltiate usual transfers, stairs, [facility].   Goal status: New   5.  Patient ambulates >500' with LRAD and neg ramp, curb & stairs single rail modified independent.  Goal status: New   6.  AROM of Left knee WFL & left ankle within 75% of right ankle Goal status: New   PLAN:  PT FREQUENCY: 2-3 x/week  PT DURATION: 13 weeks (90 days)  PLANNED INTERVENTIONS: Therapeutic exercises, Therapeutic activity, Neuro Muscular re-education, Balance training, Gait training, Patient/Family education, Joint mobilization, Stair training, DME instructions, Dry Needling, Electrical stimulation, Traction, Cryotherapy, vasopneumatic deviceMoist heat, Taping, Ultrasound, Ionotophoresis 4mg /ml Dexamethasone, and Manual therapy.  All included unless contraindicated  PLAN FOR NEXT SESSION: Do [employer] recertification,   continue to check on gait with cane, Cont with there exer & therapeutic activities to tolerance including balance activities   Vladimir Fasterobin Elnoria Livingston, PT, DPT 06/24/2022, 12:44 PM

## 2022-06-28 ENCOUNTER — Ambulatory Visit: Payer: Medicare PPO | Admitting: Physical Therapy

## 2022-06-28 ENCOUNTER — Encounter: Payer: Self-pay | Admitting: Physical Therapy

## 2022-06-28 DIAGNOSIS — M25672 Stiffness of left ankle, not elsewhere classified: Secondary | ICD-10-CM

## 2022-06-28 DIAGNOSIS — M6281 Muscle weakness (generalized): Secondary | ICD-10-CM | POA: Diagnosis not present

## 2022-06-28 DIAGNOSIS — R2681 Unsteadiness on feet: Secondary | ICD-10-CM

## 2022-06-28 DIAGNOSIS — M25562 Pain in left knee: Secondary | ICD-10-CM

## 2022-06-28 DIAGNOSIS — M25572 Pain in left ankle and joints of left foot: Secondary | ICD-10-CM

## 2022-06-28 DIAGNOSIS — R2689 Other abnormalities of gait and mobility: Secondary | ICD-10-CM

## 2022-06-28 NOTE — Therapy (Signed)
OUTPATIENT PHYSICAL THERAPY LOWER EXTREMITY TREATMENT   Patient Name: Carolyn [name] MRN: [id number] DOB:1955-11-15, 67 y.o., female Today's Date: 06/28/2022   END OF SESSION:  PT End of Session - 06/28/22 0805     Visit Number 12    Number of Visits 30    Date for PT Re-Evaluation 08/13/22    Authorization Type [facility] Medicare Choice PPO    Authorization Time Period $20 COPAY Approved  Authorization #[id number]  Tracking #[id number] 12 PT VISITS 3/4-5/31/2024    Authorization - Visit Number 12    Authorization - Number of Visits 12    Progress Note Due on Visit 20    PT Start Time 0802    PT Stop Time 0843    PT Time Calculation (min) 41 min    Equipment Utilized During Treatment Gait belt    Activity Tolerance Patient tolerated treatment well    Behavior During Therapy WFL for tasks assessed/performed                    Past Medical History:  Diagnosis Date   Alcoholic cirrhosis    Chronic pancreatitis    Diabetes mellitus without complication    Endometriosis    GERD (gastroesophageal reflux disease)    Intractable nausea and vomiting 06/13/2020   Liver disease    Malabsorption    MALABSORPTION SYNDROME   Osteoporosis 03/2011   t score -2.5   Pancreatitis    due to cyst and tumors due to calcifications   Seizure    no seizure disorder, controlled w/ meds per patient   Vitamin D deficiency 2012   VIT D 15   Past Surgical History:  Procedure Laterality Date   APPENDECTOMY     BIOPSY  01/19/2022   Procedure: BIOPSY;  Surgeon: Shellia Cleverly, DO;  Location: [facility];  Service: Gastroenterology;;   CHOLECYSTECTOMY     ESOPHAGOGASTRODUODENOSCOPY (EGD) WITH PROPOFOL N/A 01/19/2022   Procedure: ESOPHAGOGASTRODUODENOSCOPY (EGD) WITH PROPOFOL;  Surgeon: Shellia Cleverly, DO;  Location: [facility];  Service: Gastroenterology;  Laterality: N/A;   FEEDING TUBE RELOCATION  2010   JEJUNOSTOMY FEEDING TUBE  1990   MULTIPLE GASTRIC SURGERIES      MYOMECTOMY     OPEN REDUCTION INTERNAL FIXATION (ORIF) DISTAL RADIAL FRACTURE Left 06/29/2018   Procedure: OPEN REDUCTION INTERNAL FIXATION (ORIF)LEFT  DISTAL RADIAL FRACTURE;  Surgeon: Betha Loa, MD;  Location: [facility];  Service: Orthopedics;  Laterality: Left;   ROUX-EN-Y GASTRIC BYPASS     TIBIA IM NAIL INSERTION Left 04/21/2022   Procedure: LEFT INTRAMEDULLARY (IM) NAIL TIBIAL;  Surgeon: Eldred Manges, MD;  Location: [facility];  Service: Orthopedics;  Laterality: Left;   TIBIA IM NAIL INSERTION Left 05/05/2022   Procedure: LEFT TIBIAL NAIL DISTAL SCREW PLACEMENT;  Surgeon: Eldred Manges, MD;  Location: [facility];  Service: Orthopedics;  Laterality: Left;   TUBAL LIGATION     Patient Active Problem List   Diagnosis Date Noted   Alkaline phosphatase elevation 06/23/2022   Pain and swelling of right lower leg 06/23/2022   Spontaneous bruising 06/23/2022   Hypothyroidism 06/23/2022   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   Palliative care patient 06/02/2022   Closed fracture of left tibia and fibula 05/05/2022   Displaced comminuted fracture of shaft of left tibia, initial encounter for closed fracture 04/21/2022   Closed fracture of proximal end of left fibula, initial encounter 04/21/2022   History of gastric ulcer 03/23/2022   Abnormal loss of  weight 03/23/2022   Acute on chronic diastolic CHF (congestive heart failure) 03/23/2022   Cellulitis of left lower extremity 03/17/2022   Anemia of chronic disease 03/17/2022   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   Abnormal CT of the abdomen 01/19/2022   Chronic gastric ulcer with perforation 01/19/2022   Alcoholic cirrhosis of liver without ascites 01/18/2022   Occult blood in stools 01/18/2022   Portal hypertensive gastropathy 01/18/2022   Hypoalbuminemia 01/11/2022   Physical deconditioning 01/09/2022   Pressure injury of skin 01/08/2022   Severe protein-calorie malnutrition 01/07/2022   Normocytic anemia 01/07/2022    Osteomyelitis 01/06/2022   Seizure (HCC) 05/08/2017   Chronic pain syndrome 05/08/2017   Gastroesophageal reflux disease without esophagitis 05/08/2017   Chronic alcoholic pancreatitis 05/08/2017   Pancreatic steatorrhea 05/08/2017   Osteoporosis 03/16/2011   Malabsorption    Chronic pancreatitis    Vitamin D deficiency     PCP: General PCP   REFERRING PROVIDER:  Eldred Manges, MD   REFERRING DIAG:  S82.252A (ICD-10-CM) - Displaced comminuted fracture of shaft of left tibia, initial encounter for closed fracture  S82.832A (ICD-10-CM) - Closed fracture of proximal end of left fibula, initial encounter    THERAPY DIAG:  Muscle weakness (generalized)  Acute pain of left knee  Pain in left ankle and joints of left foot  Stiffness of left ankle, not elsewhere classified  Other abnormalities of gait and mobility  Unsteadiness on feet  Rationale for Evaluation and Treatment: Rehabilitation  ONSET DATE: 04/21/2022, 1st ankle sg, 05/05/2022 2nd surgery  SUBJECTIVE:   SUBJECTIVE STATEMENT:  She purchased cane last Thursday and has used it in home.  She continues to use RW for community.  Driveway is gravel and walkway is dirt path.     PERTINENT HISTORY: DM, Alcoholism in remission, chronic pancreatitis, hepatic cirrhosis, She had prolonged hospitalization 03/13/2022-1/29/204 with severe sepsis included hypoxic respiratory failure.   PAIN: NPRS scale: today left knee 2/10 & ankle 2/10 and since last PT lowest knee 2/10, ankle 2/10  & highest knee 2/10 ankle 2/10 Pain location: left knee more patella & ankle more medially Pain description: ache,  Aggravating factors:  meds wearing off, over night with increased in morning Relieving factors: meds, elevating & not moving knee / ankle  PRECAUTIONS: Fall  WEIGHT BEARING RESTRICTIONS: Yes LLE WBAT as of 05/18/2022  FALLS:  Has patient fallen in last 6 months? Yes. Number of falls 1  LIVING ENVIRONMENT: Lives with: lives with  their spouse and 3 dogs 45, 20 & 50# Lives in: House single story  Stairs: Yes: External: 6 (long width)  steps; on right going up temporary ramp primary entrance Has following equipment at home: Dan Humphreys - 2 wheeled, Wheelchair (manual), Shower bench, bed side commode, Grab bars, and Ramped entry  OCCUPATION: retired   PLOF: Independent  PATIENT GOALS:   get back to function in house & access community  Next MD visit:  05/18/2022  OBJECTIVE:   DIAGNOSTIC FINDINGS: 05/11/2022: Post distal tibial screw placement.  AP knee AP ankle appear to be in good rotational alignment.   PATIENT SURVEYS:  05/17/2022 / Evaluation:  FOTO intake:  34%  predicted:  47%  COGNITION: 05/17/2022 / Evaluation:   Overall cognitive status: WFL    SENSATION: 05/17/2022 / Evaluation:   WFL for knee area but unable to test ankle as wrapped for wound  EDEMA:  05/17/2022 / Evaluation:  left foot & ankle appear edematous but wrapped due to wound.  POSTURE:  05/17/2022 /  Evaluation:  rounded shoulders, forward head, increased thoracic kyphosis, flexed trunk , and weight shift right  PALPATION: 05/17/2022 / Evaluation: not tested due to wrapped for wound  LOWER EXTREMITY ROM:   ROM Left Eval 05/17/22 Left 05/26/22 Left 06/16/22  Hip flexion     Hip extension     Hip abduction     Hip adduction     Hip internal rotation     Hip external rotation     Knee flexion     Knee extension Seated A: LAQ -22* Seated  A: LAQ -11* Seated  A: LAQ -9*  Ankle dorsiflexion  Seated knee flexed A: -3* P: +2*  Seated knee flexed A: +3* P: +6*  Ankle plantarflexion  P & A 19* P & A 21*  Ankle inversion     Ankle eversion     Ankle Not tested at evaluation due to wound with bandages.  (Blank rows = not tested)  LOWER EXTREMITY MMT:  MMT Right eval Left eval  Hip flexion    Hip extension    Hip abduction    Hip adduction    Hip internal rotation    Hip external rotation    Knee flexion  3-/5  Knee extension  3-/5   Ankle dorsiflexion    Ankle plantarflexion    Ankle inversion    Ankle eversion    Ankle Not tested at evaluation due to Toe touch Weightbearing status.  (Blank rows = not tested)   FUNCTIONAL TESTS:  05/17/2022 / Evaluation: 18 inch chair transfer:  requires armrests or RW to push up to arise.  Requires RW support for stabilization  GAIT: 06/28/2022: Pt amb 200' with cane stand alone tip with supervision on flat indoor surfaces. Gait velocity 2.53 ft/sec   05/17/2022 / Evaluation: Distance walked: 30' Assistive device utilized: Environmental consultant - 2 wheeled Level of assistance: SBA Comments: NWB LLE.  Turns with pivoting on foot incorrectly.    TODAY'S TREATMENT                                                                          DATE: 06/28/2022: Therapeutic Exercise: Precor recumbent bike seat 6 with BLEs level 4 for 8 min BAPS standing //bars LLE CW & CCW 7 reps 2 sets Standing heel raise & toe raise without UE support 15 reps straight 1st set;  2nd set lateral movement to include some inversion / eversion to motion 15 reps both PF & DF to right & left Sit to stand using single UE (unable without UE) and stand to sit with light touch 5 reps ea UE for 10 reps  Neuromuscular Re-education: Braiding //bars with intermittent touch for balance 3 laps Tandem on foam beam 1st lap with BUE support, 2nd & 3rd lap with intermittent touch Side stepping on foam beam 1st lap with BUE support, 2nd & 3rd lap with intermittent touch  Gait Training: Pt amb 200' X 2 with cane stand alone tip with SBA & verbal cues to not toe in LLE.  PT demo & verbal cues on neg ramp & curb. Pt return demo with cane 1st rep of each with CGA then SBA. PT demo & verbal cues on technique to neg stairs alternating  pattern. Pt return demo flight of 11 steps with 2 rails first flight, then cane & single rail switching sides half way 2nd flight.  PT recommending starting basic community with cane with family supervision  initially. Pt verbalized understanding.  PT recommended to consider purchasing new shoes as could help end of day pain on plantar surface of feet.   06/24/2022: Therapeutic Exercise: Leg press BLEs 75# 15 reps, 100# 8 reps, 81# 15 reps;  single leg 31# LLE 5 reps & RLE 10 reps 2 sets Braiding //bars  BUE 3 laps BAPS standing //bars LLE CW & CCW 5 reps 2 sets Sit to stand using single UE (unable without UE) and stand to sit with light touch 5 reps ea UE for 10 reps  Self-Care: PT educated verbally on signs/symptoms of DVT & PE including need to call "911" pt verbalized understanding.  Gait Training: PT demo & verbal cues on cane use. Pt amb 75' X 2 with cane stand alone tip with CGA & verbal cues for technique.PT educated pt & partner in Social worker and need for assistance. Recommending only in home with assistance for now. Outside use RW &  slow down to control ankle on uneven surfaces.  Pt & partner verbalized understanding.    06/16/2022: PT working with left shoe instead of CAM boot in clinic only. Pt reports no change in pain  Therapeutic Exercise: Precor recumbent bike seat 6 with BLEs level 4 for 8 min Leg press BLEs 100# 15 reps 2 sets;  single leg 31# LLE 6 reps & RLE 10 reps 2 sets Seated hamstring stretch with strap 30 sec hold 2 reps See ROM  Neuromuscular Re-educatoin: Counter BUE support: braiding 3 laps RW support tandem gait 10' X 6 RW support marching 25'   Gait Training: Pt amb with RW 100' X 2 with shoe LLE instead of CAM boot with WBAT supervision. No change in pain. PT continues to remind pt to use RW until MD appt & clearance for increasing weight bearing.    PATIENT EDUCATION:  Education details: HEP, POC Person educated: Patient Education method: Programmer, multimedia, Demonstration, Verbal cues, and Handouts Education comprehension: verbalized understanding, returned demonstration, and verbal cues required  HOME EXERCISE PROGRAM: Access Code: [email or web address] URL:  [email or web address] Date: 06/09/2022 Prepared by: Vladimir Faster  Exercises - seated marching  - 2 x daily - 7 x weekly - 3 sets - 10 reps - 5 seconds hold - Seated Hip Abduction  - 2 x daily - 7 x weekly - 3 sets - 10 reps - 5 seconds hold - Seated Hamstring Stretch  - 2 x daily - 7 x weekly - 1 sets - 3 reps - 20-30 seconds hold - Seated Ankle Pumps  - 2 x daily - 7 x weekly - 3 sets - 10 reps - 5 seconds hold - Seated Ankle Circles  - 2 x daily - 7 x weekly - 3 sets - 10 reps - Seated Leg Press with Resistance  - 1 x daily - 7 x weekly - 2 sets - 10 reps - 5 seconds hold - Seated Ankle Plantarflexion with Resistance  - 1 x daily - 7 x weekly - 2 sets - 10 reps - 5 seconds hold - Ankle Dorsiflexion with Resistance  - 1 x daily - 7 x weekly - 2 sets - 10 reps - 5 seconds hold - Seated Figure 4 Ankle Inversion with Resistance  - 1 x daily - 7 x weekly - 2 sets -  10 reps - 5 seconds hold - Seated Ankle Eversion with Anchored Resistance  - 1 x daily - 7 x weekly - 1 sets - 10 reps - 5 seconds hold - Standing Gastroc Stretch on Step  - 1 x daily - 7 x weekly - 1 sets - 2-3 reps - 30 seconds hold - Standing Soleus Stretch on Step  - 1 x daily - 7 x weekly - 1 sets - 2-3 reps - 30 seconds hold  ASSESSMENT:  CLINICAL IMPRESSION: Patient appears to understand basics of negotiating ramps, curbs & stairs single rail with cane.  PT recommending getting new shoes as the old shoes may be part of pain in sole of foot at end of day.  Pt is challenged by balance activities on compliant surfaces but does not appear due to ankle instability.    Patient continues to benefit from skilled PT.  OBJECTIVE IMPAIRMENTS: Abnormal gait, cardiopulmonary status limiting activity, decreased activity tolerance, decreased balance, decreased coordination, decreased endurance, decreased knowledge of condition, decreased knowledge of use of DME, decreased mobility, difficulty walking, decreased ROM, decreased  strength, increased edema, increased muscle spasms, impaired flexibility, impaired sensation, postural dysfunction, and pain.   ACTIVITY LIMITATIONS: carrying, lifting, bending, standing, squatting, sleeping, stairs, transfers, reach over head, and locomotion level  PARTICIPATION LIMITATIONS: meal prep, cleaning, driving, and community activity  PERSONAL FACTORS: Fitness, Time since onset of injury/illness/exacerbation, and 3+ comorbidities: see PMH  are also affecting patient's functional outcome.   REHAB POTENTIAL: Good  CLINICAL DECISION MAKING: Stable/uncomplicated  EVALUATION COMPLEXITY: Low   GOALS: Goals reviewed with patient? Yes  SHORT TERM GOALS: (target date for Short term goals are 30 days 06/16/2022)   1.  Patient will demonstrate independent use of home exercise program to maintain progress from in clinic treatments.  Goal status: MET 06/14/2022  2. Patient amb 38' with RW with TDWBing LLE (or higher if MD permits) with supervision.  Goal Status: MET 06/14/2022  LONG TERM GOALS: (target dates for all long term goals are 90 days 08/13/2022)   1. Patient will demonstrate/report pain at worst less than or equal to 2/10 to facilitate minimal limitation in daily activity secondary to pain symptoms. Goal status: New   2. Patient will demonstrate independent use of home exercise program to facilitate ability to maintain/progress functional gains from skilled physical therapy services. Goal status: New   3. Patient will demonstrate FOTO outcome > or = 47 % to indicate reduced disability due to condition.  Goal status: New   4.  Patient will demonstrate LLE MMT 5/5 throughout to faciltiate usual transfers, stairs, squatting at [facility] for daily life.   Goal status: New   5.  Patient ambulates >500' with LRAD and neg ramp, curb & stairs single rail modified independent.  Goal status: New   6.  AROM of Left knee WFL & left ankle within 75% of right ankle Goal status:  New   PLAN:  PT FREQUENCY: 2-3 x/week  PT DURATION: 13 weeks (90 days)  PLANNED INTERVENTIONS: Therapeutic exercises, Therapeutic activity, Neuro Muscular re-education, Balance training, Gait training, Patient/Family education, Joint mobilization, Stair training, DME instructions, Dry Needling, Electrical stimulation, Traction, Cryotherapy, vasopneumatic deviceMoist heat, Taping, Ultrasound, Ionotophoresis 4mg /ml Dexamethasone, and Manual therapy.  All included unless contraindicated  PLAN FOR NEXT SESSION: Do interim FOTO,  continue work on gait with cane, Cont with there exer & therapeutic activities to tolerance including balance activities  Referring diagnosis?  Z61.096E (ICD-10-CM) - Displaced comminuted fracture of shaft of  left tibia, initial encounter for closed fracture  S82.832A (ICD-10-CM) - Closed fracture of proximal end of left fibula, initial encounter   Treatment diagnosis? (if different than referring diagnosis)  Muscle weakness (generalized) M62.81   Acute pain of left knee M25.562   Pain in left ankle and joints of left foot M25.572   Stiffness of left ankle, not elsewhere classified M25.672   Other abnormalities of gait and mobility R26.89   Unsteadiness on feet R26.81   Localized edema R60.0 What was this (referring dx) caused by? [x]  Surgery [x]  Fall []  Ongoing issue []  Arthritis []  Other: ____________  Laterality: []  Rt [x]  Lt []  Both  Check all possible CPT codes:  *CHOOSE 10 OR LESS*    [x]  97110 (Therapeutic Exercise)  []  92507 (SLP Treatment)  [x]  97112 (Neuro Re-ed)   []  92526 (Swallowing Treatment)   [x]  60454 (Gait Training)   []  [id number] (Cognitive Training, 1st 15 minutes) [x]  97140 (Manual Therapy)   []  97130 (Cognitive Training, each add'l 15 minutes)  [x]  97164 (Re-evaluation)                              []  Other, List CPT Code ____________  [x]  97530 (Therapeutic Activities)     [x]  97535 (Self Care)   [x]  All codes above (97110 -  97535)  []  97012 (Mechanical Traction)  []  97014 (E-stim Unattended)  []  97032 (E-stim manual)  []  97033 (Ionto)  []  97035 (Ultrasound) []  97750 (Physical Performance Training) []  [id number] (Aquatic Therapy) [x]  97016 (Vasopneumatic Device) []  [id number] (Paraffin) []  97034 (Contrast Bath) []  97597 (Wound Care 1st 20 sq cm) []  97598 (Wound Care each add'l 20 sq cm) []  97760 (Orthotic Fabrication, Fitting, Training Initial) []  [id number] (Prosthetic Management and Training Initial) []  [id number] (Orthotic or Prosthetic Training/ Modification Subsequent)   Vladimir Faster, PT, DPT 06/28/2022, 11:08 AM

## 2022-06-29 ENCOUNTER — Ambulatory Visit (INDEPENDENT_AMBULATORY_CARE_PROVIDER_SITE_OTHER): Payer: Medicare PPO | Admitting: Orthopedic Surgery

## 2022-06-29 ENCOUNTER — Encounter: Payer: Self-pay | Admitting: Orthopedic Surgery

## 2022-06-29 DIAGNOSIS — S91001A Unspecified open wound, right ankle, initial encounter: Secondary | ICD-10-CM | POA: Diagnosis not present

## 2022-06-29 DIAGNOSIS — S91002A Unspecified open wound, left ankle, initial encounter: Secondary | ICD-10-CM

## 2022-06-29 NOTE — Progress Notes (Signed)
Office Visit Note   Patient: Carolyn [name]           Date of Birth: Sep 09, 1955           MRN: [id number] Visit Date: 06/29/2022              Requested by: No referring provider defined for this encounter. PCP: Adron Bene, MD  Chief Complaint  Patient presents with   Left Ankle - Follow-up   Right Ankle - Follow-up      HPI: Patient is a 67 year old woman who presents with [address] grade 1 ulcers bilateral medial malleolus.  She has been wearing felt relieving donuts and the ulcers are healing sequentially.  Assessment & Plan: Visit Diagnoses:  1. Ankle wound, left, initial encounter   2. Ankle wound, right, initial encounter     Plan: Follow-up in 4 weeks continue with the pressure offloading felt relieving pads.  Recommended orthotics to help with cavus foot.  Follow-Up Instructions: Return in about 4 weeks (around 07/27/2022).   Ortho Exam  Patient is alert, oriented, no adenopathy, well-dressed, normal affect, normal respiratory effort. Examination the ulcers are showing good interval healing there is no redness cellulitis or drainage.  The left heel ulcer is essentially healed approximately 2 mm in diameter and the right medial malleolar ulcer is about 10 mm in diameter with healthy granulation tissue.  Imaging: No results found. No images are attached to the encounter.  Labs: Lab Results  Component Value Date   HGBA1C 5.3 04/07/2022   HGBA1C 4.6 (L) 01/06/2022   ESRSEDRATE 25 (H) 03/15/2022   ESRSEDRATE 25 (H) 03/13/2022   ESRSEDRATE 3 02/18/2022   CRP 1.3 (H) 01/06/2022   REPTSTATUS 03/18/2022 FINAL 03/13/2022   REPTSTATUS 03/18/2022 FINAL 03/13/2022   GRAMSTAIN  01/12/2022    RARE WBC PRESENT,BOTH PMN AND MONONUCLEAR NO ORGANISMS SEEN    CULT  03/13/2022    NO GROWTH 5 DAYS Performed at [facility], [address] [place], [place] [address]    CULT  03/13/2022    NO GROWTH 5 DAYS Performed at [facility], [address]  [place], [place] [address]    [facility] STAPHYLOCOCCUS AUREUS 08/25/2006     Lab Results  Component Value Date   ALBUMIN 3.7 (L) 06/21/2022   ALBUMIN 3.2 (L) 04/21/2022   ALBUMIN 5.3 (H) 04/10/2022   PREALBUMIN <5 (L) 03/23/2022    Lab Results  Component Value Date   MG 2.1 04/10/2022   MG 2.4 04/07/2022   MG 2.1 04/06/2022   Lab Results  Component Value Date   VD25OH 44.92 03/14/2022   VD25OH 19.56 (L) 01/09/2022   VD25OH 15 (L) 03/11/2011    Lab Results  Component Value Date   PREALBUMIN <5 (L) 03/23/2022      Latest Ref Rng & Units 06/21/2022   10:47 AM 05/05/2022    2:05 PM 04/23/2022    8:39 AM  CBC EXTENDED  WBC 3.4 - 10.8 x10E3/uL 9.5     RBC 3.77 - 5.28 x10E6/uL 4.19     Hemoglobin 11.1 - 15.9 g/dL 34.7  42.5  8.4   HCT 95.6 - 46.6 % 38.2  30.0  24.6   Platelets 150 - 450 x10E3/uL 318     NEUT# 1.4 - 7.0 x10E3/uL 6.1     Lymph# 0.7 - 3.1 x10E3/uL 2.4        There is no height or weight on file to calculate BMI.  Orders:  No  orders of the defined types were placed in this encounter.  No orders of the defined types were placed in this encounter.    Procedures: No procedures performed  Clinical Data: No additional findings.  ROS:  All other systems negative, except as noted in the HPI. Review of Systems  Objective: Vital Signs: LMP 03/10/2009   Specialty Comments:  No specialty comments available.  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   Palliative care patient 06/02/2022   Closed fracture of left tibia and fibula 05/05/2022   Displaced comminuted fracture of shaft of left tibia, initial encounter for closed fracture 04/21/2022   Closed fracture of proximal end of left fibula, initial encounter 04/21/2022   History  of gastric ulcer 03/23/2022   Abnormal loss of weight 03/23/2022   Acute on chronic diastolic CHF (congestive heart failure) 03/23/2022   Cellulitis of left lower extremity 03/17/2022   Anemia of chronic disease 03/17/2022   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   Abnormal CT of the abdomen 01/19/2022   Chronic gastric ulcer with perforation 01/19/2022   Alcoholic cirrhosis of liver without ascites 01/18/2022   Occult blood in stools 01/18/2022   Portal hypertensive gastropathy 01/18/2022   Hypoalbuminemia 01/11/2022   Physical deconditioning 01/09/2022   Pressure injury of skin 01/08/2022   Severe protein-calorie malnutrition 01/07/2022   Normocytic anemia 01/07/2022   Osteomyelitis 01/06/2022   Seizure (HCC) 05/08/2017   Chronic pain syndrome 05/08/2017   Gastroesophageal reflux disease without esophagitis 05/08/2017   Chronic alcoholic pancreatitis 05/08/2017   Pancreatic steatorrhea 05/08/2017   Osteoporosis 03/16/2011   Malabsorption    Chronic pancreatitis    Vitamin D deficiency    Past Medical History:  Diagnosis Date   Alcoholic cirrhosis    Chronic pancreatitis    Diabetes mellitus without complication    Endometriosis    GERD (gastroesophageal reflux disease)    Intractable nausea and vomiting 06/13/2020   Liver disease    Malabsorption    MALABSORPTION SYNDROME   Osteoporosis 03/2011   t score -2.5   Pancreatitis    due to cyst and tumors due to calcifications   Seizure    no seizure disorder, controlled w/ meds per patient   Vitamin D deficiency 2012   VIT D 15    Family History  Problem Relation Age of Onset   Cancer Father        BONE   Breast cancer Maternal Grandmother     Past Surgical History:  Procedure Laterality Date   APPENDECTOMY     BIOPSY  01/19/2022   Procedure: BIOPSY;  Surgeon: Shellia Cleverly, DO;  Location: [facility];  Service: Gastroenterology;;   CHOLECYSTECTOMY     ESOPHAGOGASTRODUODENOSCOPY (EGD) WITH PROPOFOL N/A  01/19/2022   Procedure: ESOPHAGOGASTRODUODENOSCOPY (EGD) WITH PROPOFOL;  Surgeon: Shellia Cleverly, DO;  Location: [facility];  Service: Gastroenterology;  Laterality: N/A;   FEEDING TUBE RELOCATION  2010   JEJUNOSTOMY FEEDING TUBE  1990   MULTIPLE GASTRIC SURGERIES     MYOMECTOMY     OPEN REDUCTION INTERNAL FIXATION (ORIF) DISTAL RADIAL FRACTURE Left 06/29/2018   Procedure: OPEN REDUCTION INTERNAL FIXATION (ORIF)LEFT  DISTAL RADIAL FRACTURE;  Surgeon: Betha Loa, MD;  Location: [facility];  Service: Orthopedics;  Laterality: Left;   ROUX-EN-Y GASTRIC BYPASS  TIBIA IM NAIL INSERTION Left 04/21/2022   Procedure: LEFT INTRAMEDULLARY (IM) NAIL TIBIAL;  Surgeon: Eldred Manges, MD;  Location: [facility];  Service: Orthopedics;  Laterality: Left;   TIBIA IM NAIL INSERTION Left 05/05/2022   Procedure: LEFT TIBIAL NAIL DISTAL SCREW PLACEMENT;  Surgeon: Eldred Manges, MD;  Location: [facility];  Service: Orthopedics;  Laterality: Left;   TUBAL LIGATION     Social History   Occupational History   Not on file  Tobacco Use   Smoking status: Never   Smokeless tobacco: Never  Vaping Use   Vaping Use: Never used  Substance and Sexual Activity   Alcohol use: Yes    Comment: occ ( none in several weeks)   Drug use: No   Sexual activity: Not Currently    Partners: Male    Birth control/protection: Post-menopausal

## 2022-06-30 ENCOUNTER — Ambulatory Visit: Payer: Medicare PPO | Admitting: Physical Therapy

## 2022-06-30 ENCOUNTER — Ambulatory Visit (INDEPENDENT_AMBULATORY_CARE_PROVIDER_SITE_OTHER): Payer: Medicare PPO | Admitting: Internal Medicine

## 2022-06-30 ENCOUNTER — Encounter: Payer: Self-pay | Admitting: Physical Therapy

## 2022-06-30 ENCOUNTER — Encounter: Payer: Self-pay | Admitting: Internal Medicine

## 2022-06-30 ENCOUNTER — Other Ambulatory Visit (HOSPITAL_COMMUNITY): Payer: Self-pay

## 2022-06-30 VITALS — BP 134/76 | HR 110 | Temp 97.8°F | Wt 103.6 lb

## 2022-06-30 DIAGNOSIS — M25672 Stiffness of left ankle, not elsewhere classified: Secondary | ICD-10-CM | POA: Diagnosis not present

## 2022-06-30 DIAGNOSIS — R2689 Other abnormalities of gait and mobility: Secondary | ICD-10-CM

## 2022-06-30 DIAGNOSIS — R935 Abnormal findings on diagnostic imaging of other abdominal regions, including retroperitoneum: Secondary | ICD-10-CM

## 2022-06-30 DIAGNOSIS — E876 Hypokalemia: Secondary | ICD-10-CM

## 2022-06-30 DIAGNOSIS — M6281 Muscle weakness (generalized): Secondary | ICD-10-CM | POA: Diagnosis not present

## 2022-06-30 DIAGNOSIS — K7030 Alcoholic cirrhosis of liver without ascites: Secondary | ICD-10-CM | POA: Diagnosis not present

## 2022-06-30 DIAGNOSIS — K860 Alcohol-induced chronic pancreatitis: Secondary | ICD-10-CM

## 2022-06-30 DIAGNOSIS — K9089 Other intestinal malabsorption: Secondary | ICD-10-CM

## 2022-06-30 DIAGNOSIS — M25562 Pain in left knee: Secondary | ICD-10-CM

## 2022-06-30 DIAGNOSIS — K766 Portal hypertension: Secondary | ICD-10-CM

## 2022-06-30 DIAGNOSIS — K3189 Other diseases of stomach and duodenum: Secondary | ICD-10-CM

## 2022-06-30 DIAGNOSIS — R2681 Unsteadiness on feet: Secondary | ICD-10-CM

## 2022-06-30 DIAGNOSIS — M25572 Pain in left ankle and joints of left foot: Secondary | ICD-10-CM | POA: Diagnosis not present

## 2022-06-30 DIAGNOSIS — G894 Chronic pain syndrome: Secondary | ICD-10-CM

## 2022-06-30 DIAGNOSIS — M8080XP Other osteoporosis with current pathological fracture, unspecified site, subsequent encounter for fracture with malunion: Secondary | ICD-10-CM

## 2022-06-30 DIAGNOSIS — R748 Abnormal levels of other serum enzymes: Secondary | ICD-10-CM

## 2022-06-30 DIAGNOSIS — T50905A Adverse effect of unspecified drugs, medicaments and biological substances, initial encounter: Secondary | ICD-10-CM

## 2022-06-30 DIAGNOSIS — Z87310 Personal history of (healed) osteoporosis fracture: Secondary | ICD-10-CM

## 2022-06-30 DIAGNOSIS — E039 Hypothyroidism, unspecified: Secondary | ICD-10-CM

## 2022-06-30 MED ORDER — HYDROMORPHONE HCL 2 MG PO TABS
2.0000 mg | ORAL_TABLET | Freq: Two times a day (BID) | ORAL | 0 refills | Status: DC | PRN
  Filled 2022-07-02: qty 20, 10d supply, fill #0

## 2022-06-30 MED ORDER — HYDROMORPHONE HCL 2 MG PO TABS
2.0000 mg | ORAL_TABLET | Freq: Two times a day (BID) | ORAL | 0 refills | Status: DC | PRN
  Filled ????-??-??: fill #0

## 2022-06-30 MED ORDER — BUPRENORPHINE 20 MCG/HR TD PTWK
1.0000 | MEDICATED_PATCH | TRANSDERMAL | 0 refills | Status: DC
  Filled 2022-06-30: qty 4, 28d supply, fill #0

## 2022-06-30 MED ORDER — ALENDRONATE SODIUM 70 MG/75ML PO SOLN
70.0000 mg | ORAL | 12 refills | Status: DC
  Filled 2022-06-30: qty 300, 28d supply, fill #0
  Filled 2022-08-31: qty 300, 28d supply, fill #1
  Filled 2023-01-27: qty 300, 28d supply, fill #2
  Filled 2023-05-09: qty 75, 7d supply, fill #3

## 2022-06-30 MED ORDER — BUPRENORPHINE 20 MCG/HR TD PTWK
1.0000 | MEDICATED_PATCH | TRANSDERMAL | 0 refills | Status: DC
  Filled 2022-07-02 – 2022-07-12 (×7): qty 4, 28d supply, fill #0

## 2022-06-30 NOTE — Patient Instructions (Addendum)
Dear Carolyn [name],  Thank you for trusting [place] with your care.  We discussed your broken bones and chronic pain.  We would like to get you back on the buprenorphine patch. We will continue the oral dilaudid for now.  I have ordered a medication called alendronate to be taken once a week to help with your bones.  Please return in 1 month for a follow up.

## 2022-06-30 NOTE — Therapy (Signed)
OUTPATIENT PHYSICAL THERAPY LOWER EXTREMITY TREATMENT   Patient Name: Carolyn [name] MRN: [id number] DOB:1955-07-31, 67 y.o., female Today's Date: 06/30/2022   END OF SESSION:  PT End of Session - 06/30/22 0805     Visit Number 13    Number of Visits 30    Date for PT Re-Evaluation 08/13/22    Authorization Type [facility] Medicare Choice PPO    Authorization Time Period $20 COPAY Approved Authorization #[id number]  Tracking #[id number] 4/15/-5/24    Authorization - Visit Number 1    Authorization - Number of Visits 12    Progress Note Due on Visit 20    PT Start Time 0801    PT Stop Time 0843    PT Time Calculation (min) 42 min    Equipment Utilized During Treatment Gait belt    Activity Tolerance Patient tolerated treatment well    Behavior During Therapy WFL for tasks assessed/performed                     Past Medical History:  Diagnosis Date   Alcoholic cirrhosis    Chronic pancreatitis    Diabetes mellitus without complication    Endometriosis    GERD (gastroesophageal reflux disease)    Intractable nausea and vomiting 06/13/2020   Liver disease    Malabsorption    MALABSORPTION SYNDROME   Osteoporosis 03/2011   t score -2.5   Pancreatitis    due to cyst and tumors due to calcifications   Seizure    no seizure disorder, controlled w/ meds per patient   Vitamin D deficiency 2012   VIT D 15   Past Surgical History:  Procedure Laterality Date   APPENDECTOMY     BIOPSY  01/19/2022   Procedure: BIOPSY;  Surgeon: Shellia Cleverly, DO;  Location: [facility];  Service: Gastroenterology;;   CHOLECYSTECTOMY     ESOPHAGOGASTRODUODENOSCOPY (EGD) WITH PROPOFOL N/A 01/19/2022   Procedure: ESOPHAGOGASTRODUODENOSCOPY (EGD) WITH PROPOFOL;  Surgeon: Shellia Cleverly, DO;  Location: [facility];  Service: Gastroenterology;  Laterality: N/A;   FEEDING TUBE RELOCATION  2010   JEJUNOSTOMY FEEDING TUBE  1990   MULTIPLE GASTRIC SURGERIES     MYOMECTOMY     OPEN  REDUCTION INTERNAL FIXATION (ORIF) DISTAL RADIAL FRACTURE Left 06/29/2018   Procedure: OPEN REDUCTION INTERNAL FIXATION (ORIF)LEFT  DISTAL RADIAL FRACTURE;  Surgeon: Betha Loa, MD;  Location: [facility];  Service: Orthopedics;  Laterality: Left;   ROUX-EN-Y GASTRIC BYPASS     TIBIA IM NAIL INSERTION Left 04/21/2022   Procedure: LEFT INTRAMEDULLARY (IM) NAIL TIBIAL;  Surgeon: Eldred Manges, MD;  Location: [facility];  Service: Orthopedics;  Laterality: Left;   TIBIA IM NAIL INSERTION Left 05/05/2022   Procedure: LEFT TIBIAL NAIL DISTAL SCREW PLACEMENT;  Surgeon: Eldred Manges, MD;  Location: [facility];  Service: Orthopedics;  Laterality: Left;   TUBAL LIGATION     Patient Active Problem List   Diagnosis Date Noted   Alkaline phosphatase elevation 06/23/2022   Pain and swelling of right lower leg 06/23/2022   Spontaneous bruising 06/23/2022   Hypothyroidism 06/23/2022   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   Palliative care patient 06/02/2022   Closed fracture of left tibia and fibula 05/05/2022   Displaced comminuted fracture of shaft of left tibia, initial encounter for closed fracture 04/21/2022   Closed fracture of proximal end of left fibula, initial encounter 04/21/2022   History of gastric ulcer 03/23/2022   Abnormal loss of weight 03/23/2022  Acute on chronic diastolic CHF (congestive heart failure) 03/23/2022   Cellulitis of left lower extremity 03/17/2022   Anemia of chronic disease 03/17/2022   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   Abnormal CT of the abdomen 01/19/2022   Chronic gastric ulcer with perforation 01/19/2022   Alcoholic cirrhosis of liver without ascites 01/18/2022   Occult blood in stools 01/18/2022   Portal hypertensive gastropathy 01/18/2022   Hypoalbuminemia 01/11/2022   Physical deconditioning 01/09/2022   Pressure injury of skin 01/08/2022   Severe protein-calorie malnutrition 01/07/2022   Normocytic anemia 01/07/2022   Osteomyelitis  01/06/2022   Seizure (HCC) 05/08/2017   Chronic pain syndrome 05/08/2017   Gastroesophageal reflux disease without esophagitis 05/08/2017   Chronic alcoholic pancreatitis 05/08/2017   Pancreatic steatorrhea 05/08/2017   Osteoporosis 03/16/2011   Malabsorption    Chronic pancreatitis    Vitamin D deficiency     PCP: General PCP   REFERRING PROVIDER:  Eldred Manges, MD   REFERRING DIAG:  S82.252A (ICD-10-CM) - Displaced comminuted fracture of shaft of left tibia, initial encounter for closed fracture  S82.832A (ICD-10-CM) - Closed fracture of proximal end of left fibula, initial encounter    THERAPY DIAG:  Muscle weakness (generalized)  Acute pain of left knee  Pain in left ankle and joints of left foot  Stiffness of left ankle, not elsewhere classified  Other abnormalities of gait and mobility  Unsteadiness on feet  Rationale for Evaluation and Treatment: Rehabilitation  ONSET DATE: 04/21/2022, 1st ankle sg, 05/05/2022 2nd surgery  SUBJECTIVE:   SUBJECTIVE STATEMENT:  She purchased new shoes with inserts yesterday. They seem to help foot pain.   PERTINENT HISTORY: DM, Alcoholism in remission, chronic pancreatitis, hepatic cirrhosis, She had prolonged hospitalization 03/13/2022-1/29/204 with severe sepsis included hypoxic respiratory failure.   PAIN: NPRS scale: today left knee 1/10 & ankle 2/10 and since last PT lowest knee 1/10, ankle 2/10  & highest knee 2/10 ankle 2/10 Pain location: left knee more patella & ankle more medially Pain description: ache,  Aggravating factors:  meds wearing off, over night with increased in morning Relieving factors: meds, elevating & not moving knee / ankle  PRECAUTIONS: Fall  WEIGHT BEARING RESTRICTIONS: Yes LLE WBAT as of 05/18/2022  FALLS:  Has patient fallen in last 6 months? Yes. Number of falls 1  LIVING ENVIRONMENT: Lives with: lives with their spouse and 3 dogs 85, 57 & 50# Lives in: House single story  Stairs: Yes:  External: 6 (long width)  steps; on right going up temporary ramp primary entrance Has following equipment at home: Dan Humphreys - 2 wheeled, Wheelchair (manual), Shower bench, bed side commode, Grab bars, and Ramped entry  OCCUPATION: retired   PLOF: Independent  PATIENT GOALS:   get back to function in house & access community  Next MD visit:  05/18/2022  OBJECTIVE:   DIAGNOSTIC FINDINGS: 05/11/2022: Post distal tibial screw placement.  AP knee AP ankle appear to be in good rotational alignment.   PATIENT SURVEYS:  06/30/2022: FOTO 49% 05/17/2022 / Evaluation:  FOTO intake:  34%  predicted:  47%  COGNITION: 05/17/2022 / Evaluation:   Overall cognitive status: WFL    SENSATION: 05/17/2022 / Evaluation:   WFL for knee area but unable to test ankle as wrapped for wound  EDEMA:  05/17/2022 / Evaluation:  left foot & ankle appear edematous but wrapped due to wound.  POSTURE:  05/17/2022 / Evaluation:  rounded shoulders, forward head, increased thoracic kyphosis, flexed trunk , and weight shift right  PALPATION:  05/17/2022 / Evaluation: not tested due to wrapped for wound  LOWER EXTREMITY ROM:   ROM Left Eval 05/17/22 Left 05/26/22 Left 06/16/22  Hip flexion     Hip extension     Hip abduction     Hip adduction     Hip internal rotation     Hip external rotation     Knee flexion     Knee extension Seated A: LAQ -22* Seated  A: LAQ -11* Seated  A: LAQ -9*  Ankle dorsiflexion  Seated knee flexed A: -3* P: +2*  Seated knee flexed A: +3* P: +6*  Ankle plantarflexion  P & A 19* P & A 21*  Ankle inversion     Ankle eversion     Ankle Not tested at evaluation due to wound with bandages.  (Blank rows = not tested)  LOWER EXTREMITY MMT:  MMT Right eval Left eval  Hip flexion    Hip extension    Hip abduction    Hip adduction    Hip internal rotation    Hip external rotation    Knee flexion  3-/5  Knee extension  3-/5  Ankle dorsiflexion    Ankle plantarflexion    Ankle  inversion    Ankle eversion    Ankle Not tested at evaluation due to Toe touch Weightbearing status.  (Blank rows = not tested)   FUNCTIONAL TESTS:  06/30/2022: Timed Up & Go: with cane 12.90 sec & without AD 11.53 sec  05/17/2022 / Evaluation: 18 inch chair transfer:  requires armrests or RW to push up to arise.  Requires RW support for stabilization  GAIT: 06/28/2022: Pt amb 200' with cane stand alone tip with supervision on flat indoor surfaces. Gait velocity 2.53 ft/sec   05/17/2022 / Evaluation: Distance walked: 30' Assistive device utilized: Environmental consultant - 2 wheeled Level of assistance: SBA Comments: NWB LLE.  Turns with pivoting on foot incorrectly.    TODAY'S TREATMENT                                                                          DATE: 06/30/2022: Therapeutic Exercise: Precor recumbent bike seat 6 with BLEs level 4 for 8 min BAPS standing //bars LLE CW & CCW 10 reps  Neuromuscular Re-education: Braiding //bars with intermittent touch for balance 3 laps Toe walking & heel walking in //bars light BUE support 3 laps Tandem on floor 3 laps & on foam beam 3 laps with intermittent touch Side stepping on foam beam 1st lap with BUE support, 2nd & 3rd lap with intermittent touch  Gait Training: Pt arrived / exited PT amb with cane with general supervision.   See TUG in objective Pt amb without device for 250' working on balance with scanning & eyes closed up to 3 steps. She needed intermittent minA for balance. Pt amb up / down ramp 3 ramps without AD with CGA.  Pt questioned rhythm / cadence issues with her gait. PT explained step length equality effecting cadence. Pt amb out of clinic with cane working on timing cane with LLE movement instead of 3 point pattern.    06/28/2022: Therapeutic Exercise: Precor recumbent bike seat 6 with BLEs level 4 for 8 min BAPS standing //bars  LLE CW & CCW 7 reps 2 sets Standing heel raise & toe raise without UE support 15 reps straight  1st set;  2nd set lateral movement to include some inversion / eversion to motion 15 reps both PF & DF to right & left Sit to stand using single UE (unable without UE) and stand to sit with light touch 5 reps ea UE for 10 reps  Neuromuscular Re-education: Braiding //bars with intermittent touch for balance 3 laps Tandem on foam beam 1st lap with BUE support, 2nd & 3rd lap with intermittent touch Side stepping on foam beam 1st lap with BUE support, 2nd & 3rd lap with intermittent touch  Gait Training: Pt amb 200' X 2 with cane stand alone tip with SBA & verbal cues to not toe in LLE.  PT demo & verbal cues on neg ramp & curb. Pt return demo with cane 1st rep of each with CGA then SBA. PT demo & verbal cues on technique to neg stairs alternating pattern. Pt return demo flight of 11 steps with 2 rails first flight, then cane & single rail switching sides half way 2nd flight.  PT recommending starting basic community with cane with family supervision initially. Pt verbalized understanding.  PT recommended to consider purchasing new shoes as could help end of day pain on plantar surface of feet.   06/24/2022: Therapeutic Exercise: Leg press BLEs 75# 15 reps, 100# 8 reps, 81# 15 reps;  single leg 31# LLE 5 reps & RLE 10 reps 2 sets Braiding //bars  BUE 3 laps BAPS standing //bars LLE CW & CCW 5 reps 2 sets Sit to stand using single UE (unable without UE) and stand to sit with light touch 5 reps ea UE for 10 reps  Self-Care: PT educated verbally on signs/symptoms of DVT & PE including need to call "911" pt verbalized understanding.  Gait Training: PT demo & verbal cues on cane use. Pt amb 75' X 2 with cane stand alone tip with CGA & verbal cues for technique.PT educated pt & partner in Social worker and need for assistance. Recommending only in home with assistance for now. Outside use RW &  slow down to control ankle on uneven surfaces.  Pt & partner verbalized understanding.    PATIENT  EDUCATION:  Education details: HEP, POC Person educated: Patient Education method: Programmer, multimedia, Demonstration, Verbal cues, and Handouts Education comprehension: verbalized understanding, returned demonstration, and verbal cues required  HOME EXERCISE PROGRAM: Access Code: [email or web address] URL: [email or web address] Date: 06/09/2022 Prepared by: Vladimir Faster  Exercises - seated marching  - 2 x daily - 7 x weekly - 3 sets - 10 reps - 5 seconds hold - Seated Hip Abduction  - 2 x daily - 7 x weekly - 3 sets - 10 reps - 5 seconds hold - Seated Hamstring Stretch  - 2 x daily - 7 x weekly - 1 sets - 3 reps - 20-30 seconds hold - Seated Ankle Pumps  - 2 x daily - 7 x weekly - 3 sets - 10 reps - 5 seconds hold - Seated Ankle Circles  - 2 x daily - 7 x weekly - 3 sets - 10 reps - Seated Leg Press with Resistance  - 1 x daily - 7 x weekly - 2 sets - 10 reps - 5 seconds hold - Seated Ankle Plantarflexion with Resistance  - 1 x daily - 7 x weekly - 2 sets - 10 reps - 5 seconds hold - Ankle Dorsiflexion with Resistance  -  1 x daily - 7 x weekly - 2 sets - 10 reps - 5 seconds hold - Seated Figure 4 Ankle Inversion with Resistance  - 1 x daily - 7 x weekly - 2 sets - 10 reps - 5 seconds hold - Seated Ankle Eversion with Anchored Resistance  - 1 x daily - 7 x weekly - 1 sets - 10 reps - 5 seconds hold - Standing Gastroc Stretch on Step  - 1 x daily - 7 x weekly - 1 sets - 2-3 reps - 30 seconds hold - Standing Soleus Stretch on Step  - 1 x daily - 7 x weekly - 1 sets - 2-3 reps - 30 seconds hold  ASSESSMENT:  CLINICAL IMPRESSION: Patient changed shoes into more support & cushion which improved pain.  PT worked on balance & gait without AD which she improved with instruction & repetition. However she does appear safe for activities without AD outside of PT yet.    Patient continues to benefit from skilled PT.  OBJECTIVE IMPAIRMENTS: Abnormal gait, cardiopulmonary status limiting activity,  decreased activity tolerance, decreased balance, decreased coordination, decreased endurance, decreased knowledge of condition, decreased knowledge of use of DME, decreased mobility, difficulty walking, decreased ROM, decreased strength, increased edema, increased muscle spasms, impaired flexibility, impaired sensation, postural dysfunction, and pain.   ACTIVITY LIMITATIONS: carrying, lifting, bending, standing, squatting, sleeping, stairs, transfers, reach over head, and locomotion level  PARTICIPATION LIMITATIONS: meal prep, cleaning, driving, and community activity  PERSONAL FACTORS: Fitness, Time since onset of injury/illness/exacerbation, and 3+ comorbidities: see PMH  are also affecting patient's functional outcome.   REHAB POTENTIAL: Good  CLINICAL DECISION MAKING: Stable/uncomplicated  EVALUATION COMPLEXITY: Low   GOALS: Goals reviewed with patient? Yes  SHORT TERM GOALS: (target date for Short term goals are 30 days 06/16/2022)   1.  Patient will demonstrate independent use of home exercise program to maintain progress from in clinic treatments.  Goal status: MET 06/14/2022  2. Patient amb 37' with RW with TDWBing LLE (or higher if MD permits) with supervision.  Goal Status: MET 06/14/2022  LONG TERM GOALS: (target dates for all long term goals are 90 days 08/13/2022)   1. Patient will demonstrate/report pain at worst less than or equal to 2/10 to facilitate minimal limitation in daily activity secondary to pain symptoms. Goal status: New   2. Patient will demonstrate independent use of home exercise program to facilitate ability to maintain/progress functional gains from skilled physical therapy services. Goal status: New   3. Patient will demonstrate FOTO outcome > or = 47 % to indicate reduced disability due to condition.  Goal status: New   4.  Patient will demonstrate LLE MMT 5/5 throughout to faciltiate usual transfers, stairs, squatting at [facility] for daily life.   Goal  status: New   5.  Patient ambulates >500' with LRAD and neg ramp, curb & stairs single rail modified independent.  Goal status: New   6.  AROM of Left knee WFL & left ankle within 75% of right ankle Goal status: New   PLAN:  PT FREQUENCY: 2-3 x/week  PT DURATION: 13 weeks (90 days)  PLANNED INTERVENTIONS: Therapeutic exercises, Therapeutic activity, Neuro Muscular re-education, Balance training, Gait training, Patient/Family education, Joint mobilization, Stair training, DME instructions, Dry Needling, Electrical stimulation, Traction, Cryotherapy, vasopneumatic deviceMoist heat, Taping, Ultrasound, Ionotophoresis 4mg /ml Dexamethasone, and Manual therapy.  All included unless contraindicated  PLAN FOR NEXT SESSION:   continue work on gait & balance without device, Cont with there  exer & therapeutic activities to tolerance including balance activities   Vladimir Faster, PT, DPT 06/30/2022, 11:23 AM

## 2022-06-30 NOTE — Progress Notes (Signed)
CC: follow up  HPI:[name] is a 67 y.o. female who presents for evaluation of chronic pain. Please see individual problem based A/P for details.  Hx Cirrhosis, pancreatitis, chronic pain, osteoporosis and fragility fracture, presenting for chronic pain follow up.    Depression, PHQ-9: Based on the patients  score we have .  Past Medical History:  Diagnosis Date   Alcoholic cirrhosis    Chronic pancreatitis    Diabetes mellitus without complication    Endometriosis    GERD (gastroesophageal reflux disease)    Intractable nausea and vomiting 06/13/2020   Liver disease    Malabsorption    MALABSORPTION SYNDROME   Osteoporosis 03/2011   t score -2.5   Pancreatitis    due to cyst and tumors due to calcifications   Seizure    no seizure disorder, controlled w/ meds per patient   Vitamin D deficiency 2012   VIT D 15   Review of Systems:   See HPI  Physical Exam: Vitals:   06/30/22 1516  BP: 134/76  Pulse: (!) 110  Temp: 97.8 F (36.6 C)  TempSrc: Oral  SpO2: 100%  Weight: 103 lb 9.6 oz (47 kg)   General: NAD, elderly appearing female, NAD HEENT: Conjunctiva nl , antiicteric sclerae, moist mucous membranes, no exudate or erythema Cardiovascular: Normal rate, regular rhythm.  No murmurs, rubs, or gallops Pulmonary : Equal breath sounds, No wheezes, rales, or rhonchi Abdominal: soft, nontender,  bowel sounds present Ext: No edema in lower extremities, no tenderness to palpation of lower extremities.  Neuro: alert and oriented  Assessment & Plan:   See Encounters Tab for problem based charting.  Hypokalemia Patient lab notable for hypokalemia. She has had this problem in the past, thought secondary to alcohol use and malabsorption. This may be exacerbated by her lasix use. She has some diastolic heart failure as well as cirrhosis. Volume is controlled with oral Lasix , which is a rather low dose so we may be able to hold this medicine and monitor  clinically for signs of volume overload. Stabilization of her potassium is priority right now.   Medication adverse effect CMP notable for multiple disturbances. She does have an elevated anion gap as well which is suggestive of a metabolic acidosis. The elevated bicarb does is not consistent with acidosis and the presence of hypokalemia and hypochloremia suggest contraction alkalosis. Delta Delta with delta AG 9 > delta bicarb 8. Though this is close to equal, it does suggest a metabolic acidosis with secondary metabolic alkalosis. She does have elevation in BUN and Cr with decrease in GFR suggestive of AKI. I think an AKI from volume depletion with a secondary contraction alkalosis from her lasix may explain this. She is on a low dose of lasix and has appeared euvolemic on exam so I do not worry about rapid accumulation of fluid. I think we can safely hold her lasix for now. Plan on oral replacement of potassium and close clinic follow up in a week. Will need to have magnesium checked at that time as well. 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     Alkaline phosphatase elevation increasing alkaline phosphatase. This was obtained while fasting. GGT was checked as well and normal. This suggest that the rise in her ALP is likely of osseous etiology. One thing I had considered was  cholestatic liver injury due to her opioid prescriptions, but again, the GGT points away from this explanation. I am also reassured by her stable transaminases and synthetic function that she is not going into decompensated cirrhosis or hepatorenal syndrome.   Osteoporosis >>ASSESSMENT AND PLAN FOR OSTEOPOROSIS WRITTEN ON 07/02/2022  3:16 PM BY Adron Bene, MD  Osteoporosis and Vit D deficiency which she supplements with cholecalciferol. Recent fragility fracture of Tib-fib. Followed with  ortho and fracture seems to be healed. She no longer has to wear boot and may weight bear. Her elevated ALP was repeated fasting with GGT and suggest osseous etiology likely from resolved fracture.   She has had multiple fractures previously and if this continues will severely impact morbidity. I think she would benefit from starting bisphosphonate. Will start on alendronate.  She still needs to get DEXA done to fully characterize her osteoporosis.   >>ASSESSMENT AND PLAN FOR ALKALINE PHOSPHATASE ELEVATION WRITTEN ON 07/02/2022  3:10 PM BY Adron Bene, MD  increasing alkaline phosphatase. This was obtained while fasting. GGT was checked as well and normal. This suggest that the rise in her ALP is likely of osseous etiology. One thing I had considered was cholestatic liver injury due to her opioid prescriptions, but again, the GGT points away from this explanation. I am also reassured by her stable transaminases and synthetic function that she is not going into decompensated cirrhosis or hepatorenal syndrome.   Alcoholic cirrhosis of liver without ascites She is volume controlled with oral lasix. Has not seemed overloaded during my visits with her. Lasix may be dehydrating her. We are holding this in the setting of AKI and hypokalemia.  Mentation is intact without need for lactulose. Appears compensated at this time.  She has not had any fevers, significant ascites, or abd pain that would make me concerned for SBP. Do not believe she needs SBP prophylaxis at this time.  Patient has evidence of portal gastropathy but no varices seen. This is reassuring so I do not think we need to start propranolol at this time. It is possible that the gastropathy is related to her prior abd surgery.  Malabsorption Currently on creon. Patient does have Vit D deficiency likely stemming from her pancreatitis.   Hypothyroidism Patient taking levothyroxine . Will recheck tsh in couple of weeks.   Chronic  pain syndrome Previously followed with [facility] pain management as recently as 2022. She was on Fentanyl patches Q2 days. Had continued to drink during that time. Was found to have etoh in system and was dismissed from their practice. She established with Dr. Adriana Mccallum who was prescribing Buprenorphine patches at 50mcg/hr with 1 patch per week. She continued on this regimen until her hospitalization after which she was given Rx for Fentanyl patches Q3 days and oral Dilaudid  BID.  We will work on transitioning her back to the Buprenorphine. I think this is a good option for her given her history of Etoh use. Given the onset of action for buprenorphine patches, she can continue using Dilaudid for breakthrough pain until the patches take effect.    Patient discussed with Dr. Cleda Daub

## 2022-07-01 ENCOUNTER — Other Ambulatory Visit (HOSPITAL_COMMUNITY): Payer: Self-pay

## 2022-07-01 LAB — CMP14 + ANION GAP
ALT: 19 IU/L (ref 0–32)
AST: 33 IU/L (ref 0–40)
Albumin/Globulin Ratio: 1.4 (ref 1.2–2.2)
Albumin: 4.1 g/dL (ref 3.9–4.9)
Alkaline Phosphatase: 301 IU/L — ABNORMAL HIGH (ref 44–121)
Anion Gap: 19 mmol/L — ABNORMAL HIGH (ref 10.0–18.0)
BUN/Creatinine Ratio: 30 — ABNORMAL HIGH (ref 12–28)
BUN: 32 mg/dL — ABNORMAL HIGH (ref 8–27)
Bilirubin Total: 0.4 mg/dL (ref 0.0–1.2)
CO2: 32 mmol/L — ABNORMAL HIGH (ref 20–29)
Calcium: 9.4 mg/dL (ref 8.7–10.3)
Chloride: 88 mmol/L — ABNORMAL LOW (ref 96–106)
Creatinine, Ser: 1.05 mg/dL — ABNORMAL HIGH (ref 0.57–1.00)
Globulin, Total: 3 g/dL (ref 1.5–4.5)
Glucose: 94 mg/dL (ref 70–99)
Potassium: 2.9 mmol/L — ABNORMAL LOW (ref 3.5–5.2)
Sodium: 139 mmol/L (ref 134–144)
Total Protein: 7.1 g/dL (ref 6.0–8.5)
eGFR: 59 mL/min/{1.73_m2} — ABNORMAL LOW (ref >59)

## 2022-07-01 LAB — GAMMA GT: GGT: 40 IU/L (ref 0–60)

## 2022-07-01 MED ORDER — POTASSIUM CHLORIDE CRYS ER 20 MEQ PO TBCR
20.0000 meq | EXTENDED_RELEASE_TABLET | Freq: Two times a day (BID) | ORAL | 0 refills | Status: DC
  Filled 2022-07-01: qty 20, 10d supply, fill #0

## 2022-07-01 NOTE — Progress Notes (Signed)
CMP notable for multiple disturbances.  The elevated bicarb, hypokalemia, and hypochloremia suggest contraction alkalosis. She does have an elevated anion gap as well which is suggestive of a metabolic acidosis. Delta Delta with delta AG 9 > delta bicarb 8. Though this is close to equal, it does suggest a metabolic acidosis with secondary metabolic alkalosis. She does have elevation in BUN and Cr with decrease in GFR suggestive of AKI. I think an AKI from volume depletion with a secondary contraction alkalosis from her lasix may explain this. She is on a low dose of lasix and has appeared euvolemic on exam so I do not worry about rapid accumulation of fluid. I think we can safely hold her lasix for now. Plan on oral replacement of potassium and close clinic follow up in a week. Will need to have magnesium checked at that time as well. 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  Additionally, she does have increasing alkaline phosphatase. This was obtained while fasting. GGT was checked as well and normal.  This suggest that the rise in her ALP is likely of osseous etiology. One thing I had considered was cholestatic liver injury due to her opioid prescriptions, but again, the GGT points away from this explanation. I am also reassured by her stable transaminases and synthetic function that she is not going into decompensated cirrhosis or hepatorenal syndrome.

## 2022-07-01 NOTE — Assessment & Plan Note (Signed)
Patient lab notable for hypokalemia. She has had this problem in the past, thought secondary to alcohol use and malabsorption. This may be exacerbated by her lasix use. She has some diastolic heart failure as well as cirrhosis. Volume is controlled with oral Lasix , which is a rather low dose so we may be able to hold this medicine and monitor clinically for signs of volume overload. Stabilization of her potassium is priority right now.

## 2022-07-01 NOTE — Progress Notes (Signed)
Internal Medicine Clinic Attending  I saw and evaluated the patient.  I personally confirmed the key portions of the history and exam documented by Dr. Burnice Logan and I reviewed pertinent patient test results.  The assessment, diagnosis, and plan were formulated together and I agree with the documentation in the resident's note.      Very complex new patient visit.  Patient has chronic pancreatitis complicated by chronic pain, cirrhosis with portal HTN, HFpEF, hypothyroidism, and is very thin with recent tib/fib fracture.   I think the most urgent thing to address is going to be her chronic pain management. We briefly described how our clinic has an opioid contract, and I think we will need a full visit to dedicate towards pain mgmt vs referring her to a pain clinic. UDS ordered today. She currently has a supply of dilaudid & fentanyl patches from her recent hospitalization, so we will make a follow up visit in 1 week to address pain mgmt plan.  I think she is high risk for osteoporosis, so will check DEXA scan. With her unilateral leg swelling and recent fracture, I agree with plan for lower extremity doppler. She is not on her hypothyroid medicines, so will check TSH, and restart Synthroid if needed. She will need to establish with GI for routine screening (EGD, etc) for cirrhosis, but we will address this more fully at future visit. Will check CMP, CBC, and coags today.

## 2022-07-02 ENCOUNTER — Encounter: Payer: Self-pay | Admitting: Internal Medicine

## 2022-07-02 ENCOUNTER — Other Ambulatory Visit (HOSPITAL_COMMUNITY): Payer: Self-pay

## 2022-07-02 DIAGNOSIS — T50905A Adverse effect of unspecified drugs, medicaments and biological substances, initial encounter: Secondary | ICD-10-CM | POA: Insufficient documentation

## 2022-07-02 NOTE — Assessment & Plan Note (Signed)
She is volume controlled with oral lasix. Has not seemed overloaded during my visits with her. Lasix may be dehydrating her. We are holding this in the setting of AKI and hypokalemia.  Mentation is intact without need for lactulose. Appears compensated at this time.  She has not had any fevers, significant ascites, or abd pain that would make me concerned for SBP. Do not believe she needs SBP prophylaxis at this time.  Patient has evidence of portal gastropathy but no varices seen. This is reassuring so I do not think we need to start propranolol at this time. It is possible that the gastropathy is related to her prior abd surgery.

## 2022-07-02 NOTE — Assessment & Plan Note (Addendum)
>>  ASSESSMENT AND PLAN FOR OSTEOPOROSIS WRITTEN ON 07/02/2022  3:16 PM BY Adron Bene, MD  Osteoporosis and Vit D deficiency which she supplements with cholecalciferol. Recent fragility fracture of Tib-fib. Followed with ortho and fracture seems to be healed. She no longer has to wear boot and may weight bear. Her elevated ALP was repeated fasting with GGT and suggest osseous etiology likely from resolved fracture.   She has had multiple fractures previously and if this continues will severely impact morbidity. I think she would benefit from starting bisphosphonate. Will start on alendronate.  She still needs to get DEXA done to fully characterize her osteoporosis.   >>ASSESSMENT AND PLAN FOR ALKALINE PHOSPHATASE ELEVATION WRITTEN ON 07/02/2022  3:10 PM BY Adron Bene, MD  increasing alkaline phosphatase. This was obtained while fasting. GGT was checked as well and normal. This suggest that the rise in her ALP is likely of osseous etiology. One thing I had considered was cholestatic liver injury due to her opioid prescriptions, but again, the GGT points away from this explanation. I am also reassured by her stable transaminases and synthetic function that she is not going into decompensated cirrhosis or hepatorenal syndrome.

## 2022-07-02 NOTE — Assessment & Plan Note (Deleted)
Currently on creon. Patient does have Vit D deficiency likely stemming from her pancreatitis.

## 2022-07-02 NOTE — Assessment & Plan Note (Signed)
Patient taking levothyroxine . Will recheck tsh in couple of weeks.

## 2022-07-02 NOTE — Assessment & Plan Note (Signed)
increasing alkaline phosphatase. This was obtained while fasting. GGT was checked as well and normal. This suggest that the rise in her ALP is likely of osseous etiology. One thing I had considered was cholestatic liver injury due to her opioid prescriptions, but again, the GGT points away from this explanation. I am also reassured by her stable transaminases and synthetic function that she is not going into decompensated cirrhosis or hepatorenal syndrome.

## 2022-07-02 NOTE — Assessment & Plan Note (Signed)
Currently on creon. Patient does have Vit D deficiency likely stemming from her pancreatitis.  

## 2022-07-02 NOTE — Assessment & Plan Note (Addendum)
Previously followed with [facility] pain management as recently as 2022. She was on Fentanyl patches Q2 days. Had continued to drink during that time. Was found to have etoh in system and was dismissed from their practice. She established with Dr. Adriana Mccallum who was prescribing Buprenorphine patches at 79mcg/hr with 1 patch per week. She continued on this regimen until her hospitalization after which she was given Rx for Fentanyl patches Q3 days and oral Dilaudid  BID.  We will work on transitioning her back to the Buprenorphine. I think this is a good option for her given her history of Etoh use. Given the onset of action for buprenorphine patches, she can continue using Dilaudid for breakthrough pain until the patches take effect.

## 2022-07-02 NOTE — Assessment & Plan Note (Addendum)
CMP notable for multiple disturbances. She does have an elevated anion gap as well which is suggestive of a metabolic acidosis. The elevated bicarb does is not consistent with acidosis and the presence of hypokalemia and hypochloremia suggest contraction alkalosis. Delta Delta with delta AG 9 > delta bicarb 8. Though this is close to equal, it does suggest a metabolic acidosis with secondary metabolic alkalosis. She does have elevation in BUN and Cr with decrease in GFR suggestive of AKI. I think an AKI from volume depletion with a secondary contraction alkalosis from her lasix may explain this. She is on a low dose of lasix and has appeared euvolemic on exam so I do not worry about rapid accumulation of fluid. I think we can safely hold her lasix for now. Plan on oral replacement of potassium and close clinic follow up in a week. Will need to have magnesium checked at that time as well. 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

## 2022-07-03 LAB — TOXASSURE SELECT,+ANTIDEPR,UR

## 2022-07-05 ENCOUNTER — Other Ambulatory Visit (HOSPITAL_COMMUNITY): Payer: Self-pay

## 2022-07-05 ENCOUNTER — Encounter: Payer: Self-pay | Admitting: Internal Medicine

## 2022-07-05 ENCOUNTER — Encounter: Payer: Self-pay | Admitting: Physical Therapy

## 2022-07-05 ENCOUNTER — Telehealth: Payer: Self-pay

## 2022-07-05 ENCOUNTER — Ambulatory Visit: Payer: Medicare PPO | Admitting: Physical Therapy

## 2022-07-05 DIAGNOSIS — M25572 Pain in left ankle and joints of left foot: Secondary | ICD-10-CM | POA: Diagnosis not present

## 2022-07-05 DIAGNOSIS — M25672 Stiffness of left ankle, not elsewhere classified: Secondary | ICD-10-CM | POA: Diagnosis not present

## 2022-07-05 DIAGNOSIS — M25562 Pain in left knee: Secondary | ICD-10-CM

## 2022-07-05 DIAGNOSIS — R600 Localized edema: Secondary | ICD-10-CM

## 2022-07-05 DIAGNOSIS — R2689 Other abnormalities of gait and mobility: Secondary | ICD-10-CM

## 2022-07-05 DIAGNOSIS — R2681 Unsteadiness on feet: Secondary | ICD-10-CM

## 2022-07-05 DIAGNOSIS — M6281 Muscle weakness (generalized): Secondary | ICD-10-CM

## 2022-07-05 NOTE — Telephone Encounter (Signed)
Prior Authorization for patient (buprenorphine ) came through via fax from [facility] Prior Authorization has been denied more information has been placed in Dr's box.

## 2022-07-05 NOTE — Therapy (Signed)
OUTPATIENT PHYSICAL THERAPY LOWER EXTREMITY TREATMENT   Patient Name: Carolyn [name] MRN: [id number] DOB:May 10, 1955, 67 y.o., female Today's Date: 07/05/2022   END OF SESSION:  PT End of Session - 07/05/22 0757     Visit Number 14    Number of Visits 30    Date for PT Re-Evaluation 08/13/22    Authorization Type [facility] Medicare Choice PPO    Authorization Time Period $20 COPAY Approved Authorization #[id number]  Tracking #[id number] 4/15/-5/24    Authorization - Visit Number 2    Authorization - Number of Visits 12    Progress Note Due on Visit 20    PT Start Time 0757    PT Stop Time 0841    PT Time Calculation (min) 44 min    Equipment Utilized During Treatment Gait belt    Activity Tolerance Patient tolerated treatment well    Behavior During Therapy WFL for tasks assessed/performed                     Past Medical History:  Diagnosis Date   Alcoholic cirrhosis    Chronic pancreatitis    Diabetes mellitus without complication    Endometriosis    GERD (gastroesophageal reflux disease)    Intractable nausea and vomiting 06/13/2020   Liver disease    Malabsorption    MALABSORPTION SYNDROME   Osteoporosis 03/2011   t score -2.5   Pancreatitis    due to cyst and tumors due to calcifications   Seizure    no seizure disorder, controlled w/ meds per patient   Vitamin D deficiency 2012   VIT D 15   Past Surgical History:  Procedure Laterality Date   APPENDECTOMY     BIOPSY  01/19/2022   Procedure: BIOPSY;  Surgeon: Shellia Cleverly, DO;  Location: [facility];  Service: Gastroenterology;;   CHOLECYSTECTOMY     ESOPHAGOGASTRODUODENOSCOPY (EGD) WITH PROPOFOL N/A 01/19/2022   Procedure: ESOPHAGOGASTRODUODENOSCOPY (EGD) WITH PROPOFOL;  Surgeon: Shellia Cleverly, DO;  Location: [facility];  Service: Gastroenterology;  Laterality: N/A;   FEEDING TUBE RELOCATION  2010   JEJUNOSTOMY FEEDING TUBE  1990   MULTIPLE GASTRIC SURGERIES     MYOMECTOMY     OPEN  REDUCTION INTERNAL FIXATION (ORIF) DISTAL RADIAL FRACTURE Left 06/29/2018   Procedure: OPEN REDUCTION INTERNAL FIXATION (ORIF)LEFT  DISTAL RADIAL FRACTURE;  Surgeon: Betha Loa, MD;  Location: [facility];  Service: Orthopedics;  Laterality: Left;   ROUX-EN-Y GASTRIC BYPASS     TIBIA IM NAIL INSERTION Left 04/21/2022   Procedure: LEFT INTRAMEDULLARY (IM) NAIL TIBIAL;  Surgeon: Eldred Manges, MD;  Location: [facility];  Service: Orthopedics;  Laterality: Left;   TIBIA IM NAIL INSERTION Left 05/05/2022   Procedure: LEFT TIBIAL NAIL DISTAL SCREW PLACEMENT;  Surgeon: Eldred Manges, MD;  Location: [facility];  Service: Orthopedics;  Laterality: Left;   TUBAL LIGATION     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   Palliative care patient 06/02/2022   History of gastric ulcer 03/23/2022   Abnormal loss of weight 03/23/2022   Acute on chronic diastolic CHF (congestive heart failure) 03/23/2022   Cellulitis of left lower extremity 03/17/2022   Anemia of chronic disease 03/17/2022   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   Abnormal CT of the abdomen 01/19/2022  Chronic gastric ulcer with perforation 01/19/2022   Alcoholic cirrhosis of liver without ascites 01/18/2022   Occult blood in stools 01/18/2022   Portal hypertensive gastropathy 01/18/2022   Hypoalbuminemia 01/11/2022   Physical deconditioning 01/09/2022   Pressure injury of skin 01/08/2022   Hypokalemia 01/07/2022   Severe protein-calorie malnutrition 01/07/2022   Normocytic anemia 01/07/2022   Osteomyelitis 01/06/2022   Seizure (HCC) 05/08/2017   Chronic pain syndrome 05/08/2017   Gastroesophageal reflux disease without esophagitis 05/08/2017   Chronic alcoholic pancreatitis 05/08/2017   Osteoporosis 03/16/2011    Malabsorption     PCP: General PCP   REFERRING PROVIDER:  Eldred Manges, MD   REFERRING DIAG:  S82.252A (ICD-10-CM) - Displaced comminuted fracture of shaft of left tibia, initial encounter for closed fracture  S82.832A (ICD-10-CM) - Closed fracture of proximal end of left fibula, initial encounter    THERAPY DIAG:  Muscle weakness (generalized)  Acute pain of left knee  Pain in left ankle and joints of left foot  Stiffness of left ankle, not elsewhere classified  Other abnormalities of gait and mobility  Unsteadiness on feet  Localized edema  Rationale for Evaluation and Treatment: Rehabilitation  ONSET DATE: 04/21/2022, 1st ankle sg, 05/05/2022 2nd surgery  SUBJECTIVE:   SUBJECTIVE STATEMENT: She is having less foot pain with new shoes.  She uses cane in home & community.  "[name] is not home during the day, so I am careful."  Her potassium is low so he ordered a liquid version & changed pain med to patch.  PERTINENT HISTORY: DM, Alcoholism in remission, chronic pancreatitis, hepatic cirrhosis, She had prolonged hospitalization 03/13/2022-1/29/204 with severe sepsis included hypoxic respiratory failure.   PAIN: NPRS scale: today left knee 2/10 & ankle 2/10 and since last PT lowest knee 1/10, ankle 2/10  & highest knee 2/10 ankle 2/10 Pain location: left knee more patella & ankle more medially Pain description: ache,  Aggravating factors:  meds wearing off, over night with increased in morning Relieving factors: meds, elevating & not moving knee / ankle  PRECAUTIONS: Fall  WEIGHT BEARING RESTRICTIONS: Yes LLE WBAT as of 05/18/2022  FALLS:  Has patient fallen in last 6 months? Yes. Number of falls 1  LIVING ENVIRONMENT: Lives with: lives with their spouse and 3 dogs 29, 33 & 50# Lives in: House single story  Stairs: Yes: External: 6 (long width)  steps; on right going up temporary ramp primary entrance Has following equipment at home: Dan Humphreys - 2 wheeled, Wheelchair  (manual), Shower bench, bed side commode, Grab bars, and Ramped entry  OCCUPATION: retired   PLOF: Independent  PATIENT GOALS:   get back to function in house & access community  Next MD visit:  05/18/2022  OBJECTIVE:   DIAGNOSTIC FINDINGS: 05/11/2022: Post distal tibial screw placement.  AP knee AP ankle appear to be in good rotational alignment.   PATIENT SURVEYS:  06/30/2022: FOTO 49% 05/17/2022 / Evaluation:  FOTO intake:  34%  predicted:  47%  COGNITION: 05/17/2022 / Evaluation:   Overall cognitive status: WFL    SENSATION: 05/17/2022 / Evaluation:   WFL for knee area but unable to test ankle as wrapped for wound  EDEMA:  05/17/2022 / Evaluation:  left foot & ankle appear edematous but wrapped due to wound.  POSTURE:  05/17/2022 / Evaluation:  rounded shoulders, forward head, increased thoracic kyphosis, flexed trunk , and weight shift right  PALPATION: 05/17/2022 / Evaluation: not tested due to wrapped for wound  LOWER EXTREMITY ROM:   ROM  Left Eval 05/17/22 Left 05/26/22 Left 06/16/22  Hip flexion     Hip extension     Hip abduction     Hip adduction     Hip internal rotation     Hip external rotation     Knee flexion     Knee extension Seated A: LAQ -22* Seated  A: LAQ -11* Seated  A: LAQ -9*  Ankle dorsiflexion  Seated knee flexed A: -3* P: +2*  Seated knee flexed A: +3* P: +6*  Ankle plantarflexion  P & A 19* P & A 21*  Ankle inversion     Ankle eversion     Ankle Not tested at evaluation due to wound with bandages.  (Blank rows = not tested)  LOWER EXTREMITY MMT:  MMT Right eval Left eval  Hip flexion    Hip extension    Hip abduction    Hip adduction    Hip internal rotation    Hip external rotation    Knee flexion  3-/5  Knee extension  3-/5  Ankle dorsiflexion    Ankle plantarflexion    Ankle inversion    Ankle eversion    Ankle Not tested at evaluation due to Toe touch Weightbearing status.  (Blank rows = not tested)   FUNCTIONAL TESTS:   06/30/2022: Timed Up & Go: with cane 12.90 sec & without AD 11.53 sec  05/17/2022 / Evaluation: 18 inch chair transfer:  requires armrests or RW to push up to arise.  Requires RW support for stabilization  GAIT: 06/28/2022: Pt amb 200' with cane stand alone tip with supervision on flat indoor surfaces. Gait velocity 2.53 ft/sec   05/17/2022 / Evaluation: Distance walked: 30' Assistive device utilized: Environmental consultant - 2 wheeled Level of assistance: SBA Comments: NWB LLE.  Turns with pivoting on foot incorrectly.    TODAY'S TREATMENT                                                                          DATE: 07/05/2022: Therapeutic Exercise: Precor recumbent bike seat 6 with BLEs level 4 for 8 min BAPS standing //bars LLE CW & CCW 10 reps Squat lift 10# kettle bell BUEs 10 reps  Neuromuscular Re-education: Braiding //bars with intermittent touch for balance 3 laps Toe walking & heel walking in //bars light BUE support 3 laps Tandem on floor 3 laps & on foam beam 3 laps with intermittent touch Side stepping on foam beam 3 laps with intermittent touch Standing crossways on foam beam tapping 4 cones lateral to across midline BLEs single UE support 10 reps; then tapping single cone in front of foot BLEs no UE support 10 reps ea LE Slider with single UE support towards 3 cones (ant-lat, lat, post-lat) 5 reps ea LE  Gait Training: Pt arrived / exited PT amb with cane with general supervision.  Pt amb 100' X 2 without device with SBA. Cues on not toe-in LLE. No LOB noted. Carrying 10# kettle bell 60'  2 x each UE Pt neg ramp without device 3 reps with SBA. No LOB noted.  Pt neg curb without device alternating lead LE 3 reps ea with SBA. No LOB noted   06/30/2022: Therapeutic Exercise: Precor recumbent bike seat  6 with BLEs level 4 for 8 min BAPS standing //bars LLE CW & CCW 10 reps  Neuromuscular Re-education: Braiding //bars with intermittent touch for balance 3 laps Toe walking & heel  walking in //bars light BUE support 3 laps Tandem on floor 3 laps & on foam beam 3 laps with intermittent touch Side stepping on foam beam 1st lap with BUE support, 2nd & 3rd lap with intermittent touch  Gait Training: Pt arrived / exited PT amb with cane with general supervision.   See TUG in objective Pt amb without device for 250' working on balance with scanning & eyes closed up to 3 steps. She needed intermittent minA for balance. Pt amb up / down ramp 3 ramps without AD with CGA.  Pt questioned rhythm / cadence issues with her gait. PT explained step length equality effecting cadence. Pt amb out of clinic with cane working on timing cane with LLE movement instead of 3 point pattern.    06/28/2022: Therapeutic Exercise: Precor recumbent bike seat 6 with BLEs level 4 for 8 min BAPS standing //bars LLE CW & CCW 7 reps 2 sets Standing heel raise & toe raise without UE support 15 reps straight 1st set;  2nd set lateral movement to include some inversion / eversion to motion 15 reps both PF & DF to right & left Sit to stand using single UE (unable without UE) and stand to sit with light touch 5 reps ea UE for 10 reps  Neuromuscular Re-education: Braiding //bars with intermittent touch for balance 3 laps Tandem on foam beam 1st lap with BUE support, 2nd & 3rd lap with intermittent touch Side stepping on foam beam 1st lap with BUE support, 2nd & 3rd lap with intermittent touch  Gait Training: Pt amb 200' X 2 with cane stand alone tip with SBA & verbal cues to not toe in LLE.  PT demo & verbal cues on neg ramp & curb. Pt return demo with cane 1st rep of each with CGA then SBA. PT demo & verbal cues on technique to neg stairs alternating pattern. Pt return demo flight of 11 steps with 2 rails first flight, then cane & single rail switching sides half way 2nd flight.  PT recommending starting basic community with cane with family supervision initially. Pt verbalized understanding.  PT  recommended to consider purchasing new shoes as could help end of day pain on plantar surface of feet.   PATIENT EDUCATION:  Education details: HEP, POC Person educated: Patient Education method: Programmer, multimedia, Demonstration, Verbal cues, and Handouts Education comprehension: verbalized understanding, returned demonstration, and verbal cues required  HOME EXERCISE PROGRAM: Access Code: [email or web address] URL: [email or web address] Date: 06/09/2022 Prepared by: Vladimir Faster  Exercises - seated marching  - 2 x daily - 7 x weekly - 3 sets - 10 reps - 5 seconds hold - Seated Hip Abduction  - 2 x daily - 7 x weekly - 3 sets - 10 reps - 5 seconds hold - Seated Hamstring Stretch  - 2 x daily - 7 x weekly - 1 sets - 3 reps - 20-30 seconds hold - Seated Ankle Pumps  - 2 x daily - 7 x weekly - 3 sets - 10 reps - 5 seconds hold - Seated Ankle Circles  - 2 x daily - 7 x weekly - 3 sets - 10 reps - Seated Leg Press with Resistance  - 1 x daily - 7 x weekly - 2 sets - 10 reps - 5 seconds hold -  Seated Ankle Plantarflexion with Resistance  - 1 x daily - 7 x weekly - 2 sets - 10 reps - 5 seconds hold - Ankle Dorsiflexion with Resistance  - 1 x daily - 7 x weekly - 2 sets - 10 reps - 5 seconds hold - Seated Figure 4 Ankle Inversion with Resistance  - 1 x daily - 7 x weekly - 2 sets - 10 reps - 5 seconds hold - Seated Ankle Eversion with Anchored Resistance  - 1 x daily - 7 x weekly - 1 sets - 10 reps - 5 seconds hold - Standing Gastroc Stretch on Step  - 1 x daily - 7 x weekly - 1 sets - 2-3 reps - 30 seconds hold - Standing Soleus Stretch on Step  - 1 x daily - 7 x weekly - 1 sets - 2-3 reps - 30 seconds hold  ASSESSMENT:  CLINICAL IMPRESSION: Patient improved balance. PT progressed difficulty of exercises & gait which she tolerated without pain.  Patient continues to benefit from skilled PT.  OBJECTIVE IMPAIRMENTS: Abnormal gait, cardiopulmonary status limiting activity, decreased activity  tolerance, decreased balance, decreased coordination, decreased endurance, decreased knowledge of condition, decreased knowledge of use of DME, decreased mobility, difficulty walking, decreased ROM, decreased strength, increased edema, increased muscle spasms, impaired flexibility, impaired sensation, postural dysfunction, and pain.   ACTIVITY LIMITATIONS: carrying, lifting, bending, standing, squatting, sleeping, stairs, transfers, reach over head, and locomotion level  PARTICIPATION LIMITATIONS: meal prep, cleaning, driving, and community activity  PERSONAL FACTORS: Fitness, Time since onset of injury/illness/exacerbation, and 3+ comorbidities: see PMH  are also affecting patient's functional outcome.   REHAB POTENTIAL: Good  CLINICAL DECISION MAKING: Stable/uncomplicated  EVALUATION COMPLEXITY: Low   GOALS: Goals reviewed with patient? Yes  SHORT TERM GOALS: (target date for Short term goals are 30 days 06/16/2022)   1.  Patient will demonstrate independent use of home exercise program to maintain progress from in clinic treatments.  Goal status: MET 06/14/2022  2. Patient amb 13' with RW with TDWBing LLE (or higher if MD permits) with supervision.  Goal Status: MET 06/14/2022  LONG TERM GOALS: (target dates for all long term goals are 90 days 08/13/2022)   1. Patient will demonstrate/report pain at worst less than or equal to 2/10 to facilitate minimal limitation in daily activity secondary to pain symptoms. Goal status: New   2. Patient will demonstrate independent use of home exercise program to facilitate ability to maintain/progress functional gains from skilled physical therapy services. Goal status: New   3. Patient will demonstrate FOTO outcome > or = 47 % to indicate reduced disability due to condition.  Goal status: New   4.  Patient will demonstrate LLE MMT 5/5 throughout to faciltiate usual transfers, stairs, squatting at [facility] for daily life.   Goal status: New   5.   Patient ambulates >500' with LRAD and neg ramp, curb & stairs single rail modified independent.  Goal status: New   6.  AROM of Left knee WFL & left ankle within 75% of right ankle Goal status: New   PLAN:  PT FREQUENCY: 2-3 x/week  PT DURATION: 13 weeks (90 days)  PLANNED INTERVENTIONS: Therapeutic exercises, Therapeutic activity, Neuro Muscular re-education, Balance training, Gait training, Patient/Family education, Joint mobilization, Stair training, DME instructions, Dry Needling, Electrical stimulation, Traction, Cryotherapy, vasopneumatic deviceMoist heat, Taping, Ultrasound, Ionotophoresis 4mg /ml Dexamethasone, and Manual therapy.  All included unless contraindicated  PLAN FOR NEXT SESSION:  work on gait & balance without device, Cont  progressing there exer & therapeutic activities to tolerance including balance activities   Vladimir Faster, PT, DPT 07/05/2022, 9:22 AM

## 2022-07-06 ENCOUNTER — Other Ambulatory Visit (HOSPITAL_COMMUNITY): Payer: Self-pay

## 2022-07-06 NOTE — Progress Notes (Signed)
Internal Medicine Clinic Attending  Case discussed with the resident at the time of the visit.  We reviewed the resident's history and exam and pertinent patient test results.  I agree with the assessment, diagnosis, and plan of care documented in the resident's note.  

## 2022-07-07 ENCOUNTER — Encounter: Payer: Self-pay | Admitting: Physical Therapy

## 2022-07-07 ENCOUNTER — Telehealth: Payer: Self-pay | Admitting: *Deleted

## 2022-07-07 ENCOUNTER — Other Ambulatory Visit (HOSPITAL_COMMUNITY): Payer: Self-pay

## 2022-07-07 ENCOUNTER — Ambulatory Visit: Payer: Medicare PPO | Admitting: Physical Therapy

## 2022-07-07 ENCOUNTER — Telehealth: Payer: Self-pay | Admitting: Internal Medicine

## 2022-07-07 DIAGNOSIS — M25562 Pain in left knee: Secondary | ICD-10-CM | POA: Diagnosis not present

## 2022-07-07 DIAGNOSIS — M6281 Muscle weakness (generalized): Secondary | ICD-10-CM

## 2022-07-07 DIAGNOSIS — R2681 Unsteadiness on feet: Secondary | ICD-10-CM

## 2022-07-07 DIAGNOSIS — M25572 Pain in left ankle and joints of left foot: Secondary | ICD-10-CM

## 2022-07-07 DIAGNOSIS — M25672 Stiffness of left ankle, not elsewhere classified: Secondary | ICD-10-CM | POA: Diagnosis not present

## 2022-07-07 DIAGNOSIS — R600 Localized edema: Secondary | ICD-10-CM

## 2022-07-07 DIAGNOSIS — R2689 Other abnormalities of gait and mobility: Secondary | ICD-10-CM

## 2022-07-07 NOTE — Telephone Encounter (Signed)
Patient here states problems with getting her prescription for the Buprenorphine Patches filled at the Pharmacy.  PA needed.  PA has been sent but denied.  Needs form  to be resent to [employer] and include ICD 10 Code.  Given to Dr. Burnice Logan to complete by Mel Almond, [place] co-ordinator  to complete.

## 2022-07-07 NOTE — Therapy (Signed)
OUTPATIENT PHYSICAL THERAPY LOWER EXTREMITY TREATMENT   Patient Name: Carolyn [name] MRN: [id number] DOB:1955-11-05, 67 y.o., female Today's Date: 07/07/2022   END OF SESSION:  PT End of Session - 07/07/22 0807     Visit Number 15    Number of Visits 30    Date for PT Re-Evaluation 08/13/22    Authorization Type [facility] Medicare Choice PPO    Authorization Time Period $20 COPAY Approved Authorization #[id number]  Tracking #[id number] 4/15/-5/24    Authorization - Visit Number 3    Authorization - Number of Visits 12    Progress Note Due on Visit 20    PT Start Time 0801    PT Stop Time 0844    PT Time Calculation (min) 43 min    Equipment Utilized During Treatment Gait belt    Activity Tolerance Patient tolerated treatment well    Behavior During Therapy WFL for tasks assessed/performed                     Past Medical History:  Diagnosis Date   Alcoholic cirrhosis    Chronic pancreatitis    Diabetes mellitus without complication    Endometriosis    GERD (gastroesophageal reflux disease)    Intractable nausea and vomiting 06/13/2020   Liver disease    Malabsorption    MALABSORPTION SYNDROME   Osteoporosis 03/2011   t score -2.5   Pancreatitis    due to cyst and tumors due to calcifications   Seizure    no seizure disorder, controlled w/ meds per patient   Vitamin D deficiency 2012   VIT D 15   Past Surgical History:  Procedure Laterality Date   APPENDECTOMY     BIOPSY  01/19/2022   Procedure: BIOPSY;  Surgeon: Shellia Cleverly, DO;  Location: [facility];  Service: Gastroenterology;;   CHOLECYSTECTOMY     ESOPHAGOGASTRODUODENOSCOPY (EGD) WITH PROPOFOL N/A 01/19/2022   Procedure: ESOPHAGOGASTRODUODENOSCOPY (EGD) WITH PROPOFOL;  Surgeon: Shellia Cleverly, DO;  Location: [facility];  Service: Gastroenterology;  Laterality: N/A;   FEEDING TUBE RELOCATION  2010   JEJUNOSTOMY FEEDING TUBE  1990   MULTIPLE GASTRIC SURGERIES     MYOMECTOMY     OPEN  REDUCTION INTERNAL FIXATION (ORIF) DISTAL RADIAL FRACTURE Left 06/29/2018   Procedure: OPEN REDUCTION INTERNAL FIXATION (ORIF)LEFT  DISTAL RADIAL FRACTURE;  Surgeon: Betha Loa, MD;  Location: [facility];  Service: Orthopedics;  Laterality: Left;   ROUX-EN-Y GASTRIC BYPASS     TIBIA IM NAIL INSERTION Left 04/21/2022   Procedure: LEFT INTRAMEDULLARY (IM) NAIL TIBIAL;  Surgeon: Eldred Manges, MD;  Location: [facility];  Service: Orthopedics;  Laterality: Left;   TIBIA IM NAIL INSERTION Left 05/05/2022   Procedure: LEFT TIBIAL NAIL DISTAL SCREW PLACEMENT;  Surgeon: Eldred Manges, MD;  Location: [facility];  Service: Orthopedics;  Laterality: Left;   TUBAL LIGATION     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   Palliative care patient 06/02/2022   History of gastric ulcer 03/23/2022   Abnormal loss of weight 03/23/2022   Acute on chronic diastolic CHF (congestive heart failure) 03/23/2022   Cellulitis of left lower extremity 03/17/2022   Anemia of chronic disease 03/17/2022   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   Abnormal CT of the abdomen 01/19/2022  Chronic gastric ulcer with perforation 01/19/2022   Alcoholic cirrhosis of liver without ascites 01/18/2022   Occult blood in stools 01/18/2022   Portal hypertensive gastropathy 01/18/2022   Hypoalbuminemia 01/11/2022   Physical deconditioning 01/09/2022   Pressure injury of skin 01/08/2022   Hypokalemia 01/07/2022   Severe protein-calorie malnutrition 01/07/2022   Normocytic anemia 01/07/2022   Osteomyelitis 01/06/2022   Seizure (HCC) 05/08/2017   Chronic pain syndrome 05/08/2017   Gastroesophageal reflux disease without esophagitis 05/08/2017   Chronic alcoholic pancreatitis 05/08/2017   Osteoporosis 03/16/2011    Malabsorption     PCP: General PCP   REFERRING PROVIDER:  Eldred Manges, MD   REFERRING DIAG:  S82.252A (ICD-10-CM) - Displaced comminuted fracture of shaft of left tibia, initial encounter for closed fracture  S82.832A (ICD-10-CM) - Closed fracture of proximal end of left fibula, initial encounter    THERAPY DIAG:  Muscle weakness (generalized)  Acute pain of left knee  Pain in left ankle and joints of left foot  Stiffness of left ankle, not elsewhere classified  Other abnormalities of gait and mobility  Unsteadiness on feet  Localized edema  Rationale for Evaluation and Treatment: Rehabilitation  ONSET DATE: 04/21/2022, 1st ankle sg, 05/05/2022 2nd surgery  SUBJECTIVE:   SUBJECTIVE STATEMENT:  She sleeps on her back with legs elevated.  Her wounds seem to be healing.   PERTINENT HISTORY: DM, Alcoholism in remission, chronic pancreatitis, hepatic cirrhosis, She had prolonged hospitalization 03/13/2022-1/29/204 with severe sepsis included hypoxic respiratory failure.   PAIN: NPRS scale: today left knee 2/10 & ankle 2/10 and since last PT lowest knee 1/10, ankle 2/10  & highest knee 2/10 ankle 2/10 Pain location: left knee more patella & ankle more medially Pain description: ache,  Aggravating factors:  meds wearing off, over night with increased in morning Relieving factors: meds, elevating & not moving knee / ankle  PRECAUTIONS: Fall  WEIGHT BEARING RESTRICTIONS: Yes LLE WBAT as of 05/18/2022  FALLS:  Has patient fallen in last 6 months? Yes. Number of falls 1  LIVING ENVIRONMENT: Lives with: lives with their spouse and 3 dogs 55, 47 & 50# Lives in: House single story  Stairs: Yes: External: 6 (long width)  steps; on right going up temporary ramp primary entrance Has following equipment at home: Dan Humphreys - 2 wheeled, Wheelchair (manual), Shower bench, bed side commode, Grab bars, and Ramped entry  OCCUPATION: retired   PLOF: Independent  PATIENT GOALS:   get  back to function in house & access community  Next MD visit:  05/18/2022  OBJECTIVE:   DIAGNOSTIC FINDINGS: 05/11/2022: Post distal tibial screw placement.  AP knee AP ankle appear to be in good rotational alignment.   PATIENT SURVEYS:  06/30/2022: FOTO 49% 05/17/2022 / Evaluation:  FOTO intake:  34%  predicted:  47%  COGNITION: 05/17/2022 / Evaluation:   Overall cognitive status: WFL    SENSATION: 05/17/2022 / Evaluation:   WFL for knee area but unable to test ankle as wrapped for wound  EDEMA:  05/17/2022 / Evaluation:  left foot & ankle appear edematous but wrapped due to wound.  POSTURE:  05/17/2022 / Evaluation:  rounded shoulders, forward head, increased thoracic kyphosis, flexed trunk , and weight shift right  PALPATION: 05/17/2022 / Evaluation: not tested due to wrapped for wound  LOWER EXTREMITY ROM:   ROM Left Eval 05/17/22 Left 05/26/22 Left 06/16/22  Hip flexion     Hip extension     Hip abduction     Hip adduction  Hip internal rotation     Hip external rotation     Knee flexion     Knee extension Seated A: LAQ -22* Seated  A: LAQ -11* Seated  A: LAQ -9*  Ankle dorsiflexion  Seated knee flexed A: -3* P: +2*  Seated knee flexed A: +3* P: +6*  Ankle plantarflexion  P & A 19* P & A 21*  Ankle inversion     Ankle eversion     Ankle Not tested at evaluation due to wound with bandages.  (Blank rows = not tested)  LOWER EXTREMITY MMT:  MMT Right eval Left eval  Hip flexion    Hip extension    Hip abduction    Hip adduction    Hip internal rotation    Hip external rotation    Knee flexion  3-/5  Knee extension  3-/5  Ankle dorsiflexion    Ankle plantarflexion    Ankle inversion    Ankle eversion    Ankle Not tested at evaluation due to Toe touch Weightbearing status.  (Blank rows = not tested)   FUNCTIONAL TESTS:  06/30/2022: Timed Up & Go: with cane 12.90 sec & without AD 11.53 sec  05/17/2022 / Evaluation: 18 inch chair transfer:  requires armrests  or RW to push up to arise.  Requires RW support for stabilization  GAIT: 07/07/2022: pt amb 200' without device with supervision. She has LLE toe-in / internal rotation of hip but can correct with verbal cues.   06/28/2022: Pt amb 200' with cane stand alone tip with supervision on flat indoor surfaces. Gait velocity 2.53 ft/sec   05/17/2022 / Evaluation: Distance walked: 30' Assistive device utilized: Environmental consultant - 2 wheeled Level of assistance: SBA Comments: NWB LLE.  Turns with pivoting on foot incorrectly.    TODAY'S TREATMENT                                                                          DATE: 07/07/2022: Therapeutic Exercise: Precor recumbent bike seat 6 with BLEs level 4 for 8 min Step up, over & step down with 2 step lead BUE support //bars on BOSU round side up BLEs 10 reps. PT demo & verbal cues on technique & rationale.  Standing red theraband kicks flexion, abduction, extension, adduction 10 reps BLEs without UE support with minA for balance. Seated LLE hip external rotation red theraband 15 reps.  Neuromuscular Re-education: Tandem on foam beam 3 laps with intermittent touch Side stepping on foam beam 3 laps with intermittent touch Standing on foam pad tapping 4 cones lateral to across midline BLEs single UE support 10 reps; then tapping single cone in front of foot BLEs no UE support 10 reps ea LE   Gait Training: Pt arrived / exited PT amb with cane with general supervision.  Pt amb 200' without device with SBA. Cues on not toe-in LLE. No LOB noted. Pt neg ramp without device 3 reps with SBA. No LOB noted.  Pt neg curb without device alternating lead LE 3 reps ea with SBA. No LOB noted PT recommended walking on grass at home with cane & [name] supervision.  Verbal cues to pay attention to LLE not toe-in. Both verbalized understanding.  07/05/2022: Therapeutic Exercise: Precor recumbent bike seat 6 with BLEs level 4 for 8 min BAPS standing //bars LLE CW & CCW 10  reps Squat lift 10# kettle bell BUEs 10 reps  Neuromuscular Re-education: Braiding //bars with intermittent touch for balance 3 laps Toe walking & heel walking in //bars light BUE support 3 laps Tandem on floor 3 laps & on foam beam 3 laps with intermittent touch Side stepping on foam beam 3 laps with intermittent touch Standing crossways on foam beam tapping 4 cones lateral to across midline BLEs single UE support 10 reps; then tapping single cone in front of foot BLEs no UE support 10 reps ea LE Slider with single UE support towards 3 cones (ant-lat, lat, post-lat) 5 reps ea LE  Gait Training: Pt arrived / exited PT amb with cane with general supervision.  Pt amb 100' X 2 without device with SBA. Cues on not toe-in LLE. No LOB noted. Carrying 10# kettle bell 60'  2 x each UE Pt neg ramp without device 3 reps with SBA. No LOB noted.  Pt neg curb without device alternating lead LE 3 reps ea with SBA. No LOB noted   06/30/2022: Therapeutic Exercise: Precor recumbent bike seat 6 with BLEs level 4 for 8 min BAPS standing //bars LLE CW & CCW 10 reps  Neuromuscular Re-education: Braiding //bars with intermittent touch for balance 3 laps Toe walking & heel walking in //bars light BUE support 3 laps Tandem on floor 3 laps & on foam beam 3 laps with intermittent touch Side stepping on foam beam 1st lap with BUE support, 2nd & 3rd lap with intermittent touch  Gait Training: Pt arrived / exited PT amb with cane with general supervision.   See TUG in objective Pt amb without device for 250' working on balance with scanning & eyes closed up to 3 steps. She needed intermittent minA for balance. Pt amb up / down ramp 3 ramps without AD with CGA.  Pt questioned rhythm / cadence issues with her gait. PT explained step length equality effecting cadence. Pt amb out of clinic with cane working on timing cane with LLE movement instead of 3 point pattern.    HOME EXERCISE PROGRAM: Access Code:  [email or web address] URL: [email or web address] Date: 06/09/2022 Prepared by: Vladimir Faster  Exercises - seated marching  - 2 x daily - 7 x weekly - 3 sets - 10 reps - 5 seconds hold - Seated Hip Abduction  - 2 x daily - 7 x weekly - 3 sets - 10 reps - 5 seconds hold - Seated Hamstring Stretch  - 2 x daily - 7 x weekly - 1 sets - 3 reps - 20-30 seconds hold - Seated Ankle Pumps  - 2 x daily - 7 x weekly - 3 sets - 10 reps - 5 seconds hold - Seated Ankle Circles  - 2 x daily - 7 x weekly - 3 sets - 10 reps - Seated Leg Press with Resistance  - 1 x daily - 7 x weekly - 2 sets - 10 reps - 5 seconds hold - Seated Ankle Plantarflexion with Resistance  - 1 x daily - 7 x weekly - 2 sets - 10 reps - 5 seconds hold - Ankle Dorsiflexion with Resistance  - 1 x daily - 7 x weekly - 2 sets - 10 reps - 5 seconds hold - Seated Figure 4 Ankle Inversion with Resistance  - 1 x daily - 7 x weekly - 2 sets - 10  reps - 5 seconds hold - Seated Ankle Eversion with Anchored Resistance  - 1 x daily - 7 x weekly - 1 sets - 10 reps - 5 seconds hold - Standing Gastroc Stretch on Step  - 1 x daily - 7 x weekly - 1 sets - 2-3 reps - 30 seconds hold - Standing Soleus Stretch on Step  - 1 x daily - 7 x weekly - 1 sets - 2-3 reps - 30 seconds hold  ASSESSMENT:  CLINICAL IMPRESSION: PT progressed standing activities require more functional strength which she tolerated well. Patient's balance with gait continues to improve.  Patient continues to benefit from skilled PT.  OBJECTIVE IMPAIRMENTS: Abnormal gait, cardiopulmonary status limiting activity, decreased activity tolerance, decreased balance, decreased coordination, decreased endurance, decreased knowledge of condition, decreased knowledge of use of DME, decreased mobility, difficulty walking, decreased ROM, decreased strength, increased edema, increased muscle spasms, impaired flexibility, impaired sensation, postural dysfunction, and pain.   ACTIVITY LIMITATIONS:  carrying, lifting, bending, standing, squatting, sleeping, stairs, transfers, reach over head, and locomotion level  PARTICIPATION LIMITATIONS: meal prep, cleaning, driving, and community activity  PERSONAL FACTORS: Fitness, Time since onset of injury/illness/exacerbation, and 3+ comorbidities: see PMH  are also affecting patient's functional outcome.   REHAB POTENTIAL: Good  CLINICAL DECISION MAKING: Stable/uncomplicated  EVALUATION COMPLEXITY: Low   GOALS: Goals reviewed with patient? Yes  SHORT TERM GOALS: (target date for Short term goals are 30 days 07/16/2022)   1.  Patient will demonstrate independent use of updated home exercise program to maintain progress from in clinic treatments.  Goal status: ongoing 07/07/2022  2. Patient amb 200' & neg ramps/curbs without device with supervision.  Goal Status: ongoing 07/07/2022  LONG TERM GOALS: (target dates for all long term goals are 90 days 08/13/2022)   1. Patient will demonstrate/report pain at worst less than or equal to 2/10 to facilitate minimal limitation in daily activity secondary to pain symptoms. Goal status: Ongoing 07/07/2022   2. Patient will demonstrate independent use of home exercise program to facilitate ability to maintain/progress functional gains from skilled physical therapy services. Goal status: Ongoing 07/07/2022   3. Patient will demonstrate FOTO outcome > or = 47 % to indicate reduced disability due to condition.  Goal status: MET 06/30/2022   4.  Patient will demonstrate LLE MMT 5/5 throughout to faciltiate usual transfers, stairs, squatting at [facility] for daily life.   Goal status: Ongoing 07/07/2022   5.  Patient ambulates >500' with LRAD and neg ramp, curb & stairs single rail modified independent.  Goal status: New   6.  AROM of Left knee [facility] & left ankle within 75% of right ankle Goal status: Ongoing 07/07/2022   PLAN:  PT FREQUENCY: 2-3 x/week  PT DURATION: 13 weeks (90 days)  PLANNED  INTERVENTIONS: Therapeutic exercises, Therapeutic activity, Neuro Muscular re-education, Balance training, Gait training, Patient/Family education, Joint mobilization, Stair training, DME instructions, Dry Needling, Electrical stimulation, Traction, Cryotherapy, vasopneumatic deviceMoist heat, Taping, Ultrasound, Ionotophoresis 4mg /ml Dexamethasone, and Manual therapy.  All included unless contraindicated  PLAN FOR NEXT SESSION:  check STGs next week, work on gait & balance without device, Cont progressing there exer & therapeutic activities to tolerance including balance activities   Vladimir Faster, PT, DPT 07/07/2022, 9:52 AM

## 2022-07-07 NOTE — Telephone Encounter (Signed)
Contacted SANIYYA GAU to schedule their annual wellness visit. Appointment made for 07/13/2022.  [facility] Care Guide [facility] AWV TEAM Direct Dial: [phone]

## 2022-07-07 NOTE — Telephone Encounter (Signed)
RTC to patient . PA forms have been completed and faxed to [employer] . Awaiting decision.

## 2022-07-08 ENCOUNTER — Other Ambulatory Visit (HOSPITAL_COMMUNITY): Payer: Self-pay

## 2022-07-08 ENCOUNTER — Encounter (HOSPITAL_COMMUNITY): Payer: Self-pay

## 2022-07-08 NOTE — Telephone Encounter (Signed)
Received prior authorization request from patients insurance, form has been completed and fax back -[phone]

## 2022-07-09 ENCOUNTER — Other Ambulatory Visit (HOSPITAL_COMMUNITY): Payer: Self-pay

## 2022-07-09 ENCOUNTER — Encounter: Payer: Self-pay | Admitting: Internal Medicine

## 2022-07-09 ENCOUNTER — Ambulatory Visit: Payer: Medicare PPO | Admitting: Family Medicine

## 2022-07-09 ENCOUNTER — Ambulatory Visit (INDEPENDENT_AMBULATORY_CARE_PROVIDER_SITE_OTHER): Payer: Medicare PPO | Admitting: Internal Medicine

## 2022-07-09 VITALS — BP 137/66 | HR 109 | Temp 97.7°F | Ht 68.0 in | Wt 109.0 lb

## 2022-07-09 DIAGNOSIS — K860 Alcohol-induced chronic pancreatitis: Secondary | ICD-10-CM

## 2022-07-09 DIAGNOSIS — K7030 Alcoholic cirrhosis of liver without ascites: Secondary | ICD-10-CM | POA: Diagnosis not present

## 2022-07-09 DIAGNOSIS — I5033 Acute on chronic diastolic (congestive) heart failure: Secondary | ICD-10-CM | POA: Diagnosis not present

## 2022-07-09 DIAGNOSIS — F1090 Alcohol use, unspecified, uncomplicated: Secondary | ICD-10-CM | POA: Diagnosis not present

## 2022-07-09 NOTE — Patient Instructions (Addendum)
Dear Mrs. Bethards,  Thank you for trusting [place] with your care. We discussed your lasix/swelling. We will check some labs today. We would like for you to purchase a scale and keep an eye on your weight. Your weight today is 109lbs. The weight from the last visit was 103lbs. If you notice more than a 3lbs weight gain in 1 day, or more than a 5lb weight gain in a week, please take a dose of the oral lasix 20mg . If you notice that your weight continues to rise despite this, please call our office. Please also use some compression socks and elevate your legs to help with the swelling.  For the pain, please use the oral dilaudid. I recommend that you use Tylenol 500mg  4 times daily. We will check in with the pharmacy about the Buprenorphine. We will see you back in the clinic in 1 month to follow up.

## 2022-07-09 NOTE — Progress Notes (Signed)
   CC: labs/medication management  HPI:[name] is a 67 y.o. female who presents for evaluation of labs. Please see individual problem based A/P for details.  32 yof with hx of CHF and cirrhosis presents for medication management and labs.  Depression, PHQ-9: Based on the patients  score we have .  Past Medical History:  Diagnosis Date   Alcoholic cirrhosis (HCC)    Chronic pancreatitis (HCC)    Diabetes mellitus without complication (HCC)    Endometriosis    GERD (gastroesophageal reflux disease)    Intractable nausea and vomiting 06/13/2020   Liver disease    Malabsorption    MALABSORPTION SYNDROME   Osteoporosis 03/2011   t score -2.5   Pancreatitis    due to cyst and tumors due to calcifications   Seizure (HCC)    no seizure disorder, controlled w/ meds per patient   Vitamin D deficiency 2012   VIT D 15   Review of Systems:   See hpi  Physical Exam: Vitals:   07/09/22 0938  BP: 137/66  Pulse: (!) 109  Temp: 97.7 F (36.5 C)  TempSrc: Oral  SpO2: 100%  Weight: 109 lb (49.4 kg)  Height: 5\' 8"  (1.727 m)   General: nad HEENT: Conjunctiva nl , antiicteric sclerae, moist mucous membranes, no exudate or erythema Cardiovascular: Normal rate, regular rhythm.  No murmurs, rubs, or gallops, trace LEE to midshin bilaterally with distention of superficial leg veins.  Pulmonary : Equal breath sounds, No wheezes, rales, or rhonchi Abdominal: soft, nontender,  bowel sounds present Ext: No edema in lower extremities, no tenderness to palpation of lower extremities.   Assessment & Plan:   See Encounters Tab for problem based charting.  Acute on chronic diastolic CHF (congestive heart failure) (HCC) Patient had been instructed to hold lasix given lab abnormalities. She has done this. No reported SHOB. Does note some distention in leg veins. Has been elevating legs to help, but will have some mild swelling later on in the day. Has been taking potassium supplements.   Weight las increased 103 >>109. Trace edema in legs up to midshin. Distended veins in LE. Overall seems to be doing alright, but given distention of veins and mild swelling along with weight gain. She may need to continue the lasix. She appears to not have tolerated the 20mg  PO daily so we will make it a Prn medicine for her. Discussed weighing self each day and taking 20mg  if she notices swelling >3 lbs in 1 day or dose if she notices >5lbs in 1 week. Instructed her to only take 1 dose each day and mmonitor the following day for improvement in exam/weight. She undertands. She has been taking the potassium supplement as instructed. We will check a BMP today. If labs look stable, we will plan on follow up in 1 month. Discussed using compression socks as well and continued elevation of LE when possible.  Chronic alcoholic pancreatitis [facility]) Patient has had difficulty obtaining buprenorphine patches since she needed PA. PA has been submitted. Plans to pick up patches from pharmacy when able. She has PO dilaudid that she has been taking BID. Dilaudid not providing the long lasting coverage that patches have been. Discussed scheduled tylenol at reduced dose 500mg  4 times daily and dilaudid when needed with her. She is in agreement.   Patient discussed with Dr. Cleda Daub

## 2022-07-09 NOTE — Assessment & Plan Note (Signed)
Patient had been instructed to hold lasix given lab abnormalities. She has done this. No reported SHOB. Does note some distention in leg veins. Has been elevating legs to help, but will have some mild swelling later on in the day. Has been taking potassium supplements.  Weight las increased 103 >>109. Trace edema in legs up to midshin. Distended veins in LE. Overall seems to be doing alright, but given distention of veins and mild swelling along with weight gain. She may need to continue the lasix. She appears to not have tolerated the 20mg  PO daily so we will make it a Prn medicine for her. Discussed weighing self each day and taking 20mg  if she notices swelling >3 lbs in 1 day or dose if she notices >5lbs in 1 week. Instructed her to only take 1 dose each day and mmonitor the following day for improvement in exam/weight. She undertands. She has been taking the potassium supplement as instructed. We will check a BMP today. If labs look stable, we will plan on follow up in 1 month. Discussed using compression socks as well and continued elevation of LE when possible.

## 2022-07-09 NOTE — Assessment & Plan Note (Addendum)
Patient has had difficulty obtaining buprenorphine patches since she needed PA. PA has been submitted. Plans to pick up patches from pharmacy when able. She has PO dilaudid that she has been taking BID. Dilaudid not providing the long lasting coverage that patches have been. Discussed scheduled tylenol at reduced dose 500mg  4 times daily and dilaudid when needed with her. She is in agreement.

## 2022-07-10 LAB — BMP8+ANION GAP
Anion Gap: 13 mmol/L (ref 10.0–18.0)
BUN/Creatinine Ratio: 34 — ABNORMAL HIGH (ref 12–28)
BUN: 23 mg/dL (ref 8–27)
CO2: 22 mmol/L (ref 20–29)
Calcium: 9.3 mg/dL (ref 8.7–10.3)
Chloride: 103 mmol/L (ref 96–106)
Creatinine, Ser: 0.68 mg/dL (ref 0.57–1.00)
Glucose: 86 mg/dL (ref 70–99)
Potassium: 5.2 mmol/L (ref 3.5–5.2)
Sodium: 138 mmol/L (ref 134–144)
eGFR: 97 mL/min/{1.73_m2} (ref >59)

## 2022-07-12 ENCOUNTER — Encounter: Payer: Self-pay | Admitting: Physical Therapy

## 2022-07-12 ENCOUNTER — Ambulatory Visit: Payer: Medicare PPO | Admitting: Physical Therapy

## 2022-07-12 ENCOUNTER — Other Ambulatory Visit: Payer: Self-pay | Admitting: Internal Medicine

## 2022-07-12 ENCOUNTER — Other Ambulatory Visit (HOSPITAL_COMMUNITY): Payer: Self-pay

## 2022-07-12 DIAGNOSIS — R600 Localized edema: Secondary | ICD-10-CM

## 2022-07-12 DIAGNOSIS — R2689 Other abnormalities of gait and mobility: Secondary | ICD-10-CM

## 2022-07-12 DIAGNOSIS — M25562 Pain in left knee: Secondary | ICD-10-CM | POA: Diagnosis not present

## 2022-07-12 DIAGNOSIS — M6281 Muscle weakness (generalized): Secondary | ICD-10-CM

## 2022-07-12 DIAGNOSIS — M25672 Stiffness of left ankle, not elsewhere classified: Secondary | ICD-10-CM

## 2022-07-12 DIAGNOSIS — M25572 Pain in left ankle and joints of left foot: Secondary | ICD-10-CM

## 2022-07-12 DIAGNOSIS — R2681 Unsteadiness on feet: Secondary | ICD-10-CM

## 2022-07-12 MED ORDER — HYDROMORPHONE HCL 2 MG PO TABS: 2.0000 mg | ORAL_TABLET | Freq: Two times a day (BID) | ORAL | 0 refills | Status: AC | PRN

## 2022-07-12 MED ORDER — POTASSIUM CHLORIDE CRYS ER 20 MEQ PO TBCR
20.0000 meq | EXTENDED_RELEASE_TABLET | Freq: Two times a day (BID) | ORAL | 0 refills | Status: DC
  Filled 2022-07-12: qty 20, 10d supply, fill #0

## 2022-07-12 MED ORDER — FUROSEMIDE 20 MG PO TABS
20.0000 mg | ORAL_TABLET | Freq: Every day | ORAL | 0 refills | Status: DC
  Filled 2022-07-12: qty 30, 30d supply, fill #0

## 2022-07-12 NOTE — Addendum Note (Signed)
Addended by: Adron Bene T on: 07/12/2022 11:15 AM   Modules accepted: Orders

## 2022-07-12 NOTE — Progress Notes (Signed)
Lab abnormalities have resolved with holding of lasix and PO K supplementation. Her K is upper limit of normal. Patient has previously had normal potassium levels. To prevent hyperkalemia, I think patient would do best with taking supplementation only when she must also use her lasix. Could have her supplement K daily if she develops hypokalemia again

## 2022-07-12 NOTE — Progress Notes (Signed)
Internal Medicine Clinic Attending  Case discussed with the resident at the time of the visit.  We reviewed the resident's history and exam and pertinent patient test results.  I agree with the assessment, diagnosis, and plan of care documented in the resident's note.  

## 2022-07-12 NOTE — Therapy (Signed)
OUTPATIENT PHYSICAL THERAPY LOWER EXTREMITY TREATMENT   Patient Name: Carolyn [name] MRN: [id number] DOB:Jun 28, 1955, 67 y.o., female Today's Date: 07/12/2022   END OF SESSION:  PT End of Session - 07/12/22 0759     Visit Number 16    Number of Visits 30    Date for PT Re-Evaluation 08/13/22    Authorization Type [employer] PPO    Authorization Time Period $20 COPAY Approved Authorization #[id number]  Tracking #[id number] 4/15/-5/24    Authorization - Visit Number 4    Authorization - Number of Visits 12    Progress Note Due on Visit 20    PT Start Time 0800    PT Stop Time 0844    PT Time Calculation (min) 44 min    Equipment Utilized During Treatment Gait belt    Activity Tolerance Patient tolerated treatment well    Behavior During Therapy WFL for tasks assessed/performed                     Past Medical History:  Diagnosis Date   Alcoholic cirrhosis (HCC)    Chronic pancreatitis (HCC)    Diabetes mellitus without complication (HCC)    Endometriosis    GERD (gastroesophageal reflux disease)    Intractable nausea and vomiting 06/13/2020   Liver disease    Malabsorption    MALABSORPTION SYNDROME   Osteoporosis 03/2011   t score -2.5   Pancreatitis    due to cyst and tumors due to calcifications   Seizure (HCC)    no seizure disorder, controlled w/ meds per patient   Vitamin D deficiency 2012   VIT D 15   Past Surgical History:  Procedure Laterality Date   APPENDECTOMY     BIOPSY  01/19/2022   Procedure: BIOPSY;  Surgeon: Shellia Cleverly, DO;  Location: [facility];  Service: Gastroenterology;;   CHOLECYSTECTOMY     ESOPHAGOGASTRODUODENOSCOPY (EGD) WITH PROPOFOL N/A 01/19/2022   Procedure: ESOPHAGOGASTRODUODENOSCOPY (EGD) WITH PROPOFOL;  Surgeon: Shellia Cleverly, DO;  Location: [facility];  Service: Gastroenterology;  Laterality: N/A;   FEEDING TUBE RELOCATION  2010   JEJUNOSTOMY FEEDING TUBE  1990   MULTIPLE GASTRIC SURGERIES      MYOMECTOMY     OPEN REDUCTION INTERNAL FIXATION (ORIF) DISTAL RADIAL FRACTURE Left 06/29/2018   Procedure: OPEN REDUCTION INTERNAL FIXATION (ORIF)LEFT  DISTAL RADIAL FRACTURE;  Surgeon: Betha Loa, MD;  Location: [facility];  Service: Orthopedics;  Laterality: Left;   ROUX-EN-Y GASTRIC BYPASS     TIBIA IM NAIL INSERTION Left 04/21/2022   Procedure: LEFT INTRAMEDULLARY (IM) NAIL TIBIAL;  Surgeon: Eldred Manges, MD;  Location: [facility];  Service: Orthopedics;  Laterality: Left;   TIBIA IM NAIL INSERTION Left 05/05/2022   Procedure: LEFT TIBIAL NAIL DISTAL SCREW PLACEMENT;  Surgeon: Eldred Manges, MD;  Location: [facility];  Service: Orthopedics;  Laterality: Left;   TUBAL LIGATION     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   Palliative care patient 06/02/2022   History of gastric ulcer 03/23/2022   Abnormal loss of weight 03/23/2022   Acute on chronic diastolic CHF (congestive heart failure) (HCC) 03/23/2022   Cellulitis of left lower extremity 03/17/2022   Anemia of chronic disease 03/17/2022   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   Abnormal CT  of the abdomen 01/19/2022   Chronic gastric ulcer with perforation (HCC) 01/19/2022   Alcoholic cirrhosis of liver without ascites (HCC) 01/18/2022   Occult blood in stools 01/18/2022   Portal hypertensive gastropathy (HCC) 01/18/2022   Hypoalbuminemia 01/11/2022   Physical deconditioning 01/09/2022   Pressure injury of skin 01/08/2022   Hypokalemia 01/07/2022   Severe protein-calorie malnutrition (HCC) 01/07/2022   Normocytic anemia 01/07/2022   Osteomyelitis (HCC) 01/06/2022   Seizure (HCC) 05/08/2017   Chronic pain syndrome 05/08/2017   Gastroesophageal reflux disease without esophagitis 05/08/2017   Chronic alcoholic  pancreatitis (HCC) 05/08/2017   Osteoporosis 03/16/2011   Malabsorption     PCP: General PCP   REFERRING PROVIDER:  Eldred Manges, MD   REFERRING DIAG:  S82.252A (ICD-10-CM) - Displaced comminuted fracture of shaft of left tibia, initial encounter for closed fracture  S82.832A (ICD-10-CM) - Closed fracture of proximal end of left fibula, initial encounter    THERAPY DIAG:  Muscle weakness (generalized)  Acute pain of left knee  Pain in left ankle and joints of left foot  Stiffness of left ankle, not elsewhere classified  Other abnormalities of gait and mobility  Unsteadiness on feet  Localized edema  Rationale for Evaluation and Treatment: Rehabilitation  ONSET DATE: 04/21/2022, 1st ankle sg, 05/05/2022 2nd surgery  SUBJECTIVE:   SUBJECTIVE STATEMENT: She is supposed to be weighing each morning but does not have a scale yet.  She walks in home without device with no issues.  She walked cautiously in yard with cane with no issues.   PERTINENT HISTORY: DM, Alcoholism in remission, chronic pancreatitis, hepatic cirrhosis, She had prolonged hospitalization 03/13/2022-1/29/204 with severe sepsis included hypoxic respiratory failure.   PAIN: NPRS scale: today left knee  2/10 & ankle 1/10 and since last PT lowest knee 1/10, ankle 1/10  & highest knee 2/10 ankle 2/10 Pain location: left knee more patella & ankle more medially Pain description: ache,  Aggravating factors:  meds wearing off, over night with increased in morning Relieving factors: meds, elevating & not moving knee / ankle  PRECAUTIONS: Fall  WEIGHT BEARING RESTRICTIONS: Yes LLE WBAT as of 05/18/2022  FALLS:  Has patient fallen in last 6 months? Yes. Number of falls 1  LIVING ENVIRONMENT: Lives with: lives with their spouse and 3 dogs 38, 51 & 50# Lives in: House single story  Stairs: Yes: External: 6 (long width)  steps; on right going up temporary ramp primary entrance Has following equipment at home: Dan Humphreys  - 2 wheeled, Wheelchair (manual), Shower bench, bed side commode, Grab bars, and Ramped entry  OCCUPATION: retired   PLOF: Independent  PATIENT GOALS:   get back to function in house & access community  Next MD visit:  05/18/2022  OBJECTIVE:   DIAGNOSTIC FINDINGS: 05/11/2022: Post distal tibial screw placement.  AP knee AP ankle appear to be in good rotational alignment.   PATIENT SURVEYS:  06/30/2022: FOTO 49% 05/17/2022 / Evaluation:  FOTO intake:  34%  predicted:  47%  COGNITION: 05/17/2022 / Evaluation:   Overall cognitive status: WFL    SENSATION: 05/17/2022 / Evaluation:   WFL for knee area but unable to test ankle as wrapped for wound  EDEMA:  05/17/2022 / Evaluation:  left foot & ankle appear edematous but wrapped due to wound.  POSTURE:  05/17/2022 / Evaluation:  rounded shoulders, forward head, increased thoracic kyphosis, flexed trunk , and weight shift right  PALPATION: 05/17/2022 / Evaluation: not tested due to wrapped for wound  LOWER EXTREMITY  ROM:   ROM Left Eval 05/17/22 Left 05/26/22 Left 06/16/22  Hip flexion     Hip extension     Hip abduction     Hip adduction     Hip internal rotation     Hip external rotation     Knee flexion     Knee extension Seated A: LAQ -22* Seated  A: LAQ -11* Seated  A: LAQ -9*  Ankle dorsiflexion  Seated knee flexed A: -3* P: +2*  Seated knee flexed A: +3* P: +6*  Ankle plantarflexion  P & A 19* P & A 21*  Ankle inversion     Ankle eversion     Ankle Not tested at evaluation due to wound with bandages.  (Blank rows = not tested)  LOWER EXTREMITY MMT:  MMT Right eval Left eval  Hip flexion    Hip extension    Hip abduction    Hip adduction    Hip internal rotation    Hip external rotation    Knee flexion  3-/5  Knee extension  3-/5  Ankle dorsiflexion    Ankle plantarflexion    Ankle inversion    Ankle eversion    Ankle Not tested at evaluation due to Toe touch Weightbearing status.  (Blank rows = not  tested)   FUNCTIONAL TESTS:  06/30/2022: Timed Up & Go: with cane 12.90 sec & without AD 11.53 sec  05/17/2022 / Evaluation: 18 inch chair transfer:  requires armrests or RW to push up to arise.  Requires RW support for stabilization  GAIT: 07/07/2022: pt amb 200' without device with supervision. She has LLE toe-in / internal rotation of hip but can correct with verbal cues.   06/28/2022: Pt amb 200' with cane stand alone tip with supervision on flat indoor surfaces. Gait velocity 2.53 ft/sec   05/17/2022 / Evaluation: Distance walked: 30' Assistive device utilized: Environmental consultant - 2 wheeled Level of assistance: SBA Comments: NWB LLE.  Turns with pivoting on foot incorrectly.    TODAY'S TREATMENT                                                                          DATE: 07/12/2022: Therapeutic Exercise: Precor recumbent bike seat 6 with BLEs level 4 for 8 min Step up, over & step down with 2 step lead BUE support //bars on BOSU round side up BLEs 10 reps. PT demo & verbal cues on technique Standing red theraband kicks flexion, abduction, extension, adduction 10 reps BLEs without UE support with minA/CGA for balance. BAPs level 1 standing circles CW & CCW with minA for eversion initially. Standing on foam pad PF reaching overhead 15 reps 1st set, then moving UEs right left during PF for inversion / eversion 15 reps Gastroc stretch on step heel depression 30 sec hold 2 reps Hamstring stretch long sit strap DF trunk flexion 30 sec hold 2 reps Seated LLE hip external rotation red theraband 15 reps.  Neuromuscular Re-education: Braiding & backwards gait with supervision. Outside //bars today.   Gait Training: Pt arrived / exited PT amb with cane with general supervision.  Pt amb 400' without device with SBA. Cues on not toe-in LLE. Worked on scanning & eyes closed   07/07/2022:  Therapeutic Exercise: Precor recumbent bike seat 6 with BLEs level 4 for 8 min Step up, over & step down with  2 step lead BUE support //bars on BOSU round side up BLEs 10 reps. PT demo & verbal cues on technique & rationale.  Standing red theraband kicks flexion, abduction, extension, adduction 10 reps BLEs without UE support with minA for balance. Seated LLE hip external rotation red theraband 15 reps.  Neuromuscular Re-education: Tandem on foam beam 3 laps with intermittent touch Side stepping on foam beam 3 laps with intermittent touch Standing on foam pad tapping 4 cones lateral to across midline BLEs single UE support 10 reps; then tapping single cone in front of foot BLEs no UE support 10 reps ea LE   Gait Training: Pt arrived / exited PT amb with cane with general supervision.  Pt amb 200' without device with SBA. Cues on not toe-in LLE. No LOB noted. Pt neg ramp without device 3 reps with SBA. No LOB noted.  Pt neg curb without device alternating lead LE 3 reps ea with SBA. No LOB noted PT recommended walking on grass at home with cane & [name] supervision.  Verbal cues to pay attention to LLE not toe-in. Both verbalized understanding.    07/05/2022: Therapeutic Exercise: Precor recumbent bike seat 6 with BLEs level 4 for 8 min BAPS standing //bars LLE CW & CCW 10 reps Squat lift 10# kettle bell BUEs 10 reps  Neuromuscular Re-education: Braiding //bars with intermittent touch for balance 3 laps Toe walking & heel walking in //bars light BUE support 3 laps Tandem on floor 3 laps & on foam beam 3 laps with intermittent touch Side stepping on foam beam 3 laps with intermittent touch Standing crossways on foam beam tapping 4 cones lateral to across midline BLEs single UE support 10 reps; then tapping single cone in front of foot BLEs no UE support 10 reps ea LE Slider with single UE support towards 3 cones (ant-lat, lat, post-lat) 5 reps ea LE  Gait Training: Pt arrived / exited PT amb with cane with general supervision.  Pt amb 100' X 2 without device with SBA. Cues on not toe-in LLE. No  LOB noted. Carrying 10# kettle bell 60'  2 x each UE Pt neg ramp without device 3 reps with SBA. No LOB noted.  Pt neg curb without device alternating lead LE 3 reps ea with SBA. No LOB noted   HOME EXERCISE PROGRAM: Access Code: [email or web address] URL: [email or web address] Date: 06/09/2022 Prepared by: Vladimir Faster  Exercises - seated marching  - 2 x daily - 7 x weekly - 3 sets - 10 reps - 5 seconds hold - Seated Hip Abduction  - 2 x daily - 7 x weekly - 3 sets - 10 reps - 5 seconds hold - Seated Hamstring Stretch  - 2 x daily - 7 x weekly - 1 sets - 3 reps - 20-30 seconds hold - Seated Ankle Pumps  - 2 x daily - 7 x weekly - 3 sets - 10 reps - 5 seconds hold - Seated Ankle Circles  - 2 x daily - 7 x weekly - 3 sets - 10 reps - Seated Leg Press with Resistance  - 1 x daily - 7 x weekly - 2 sets - 10 reps - 5 seconds hold - Seated Ankle Plantarflexion with Resistance  - 1 x daily - 7 x weekly - 2 sets - 10 reps - 5 seconds hold - Ankle Dorsiflexion with Resistance  -  1 x daily - 7 x weekly - 2 sets - 10 reps - 5 seconds hold - Seated Figure 4 Ankle Inversion with Resistance  - 1 x daily - 7 x weekly - 2 sets - 10 reps - 5 seconds hold - Seated Ankle Eversion with Anchored Resistance  - 1 x daily - 7 x weekly - 1 sets - 10 reps - 5 seconds hold - Standing Gastroc Stretch on Step  - 1 x daily - 7 x weekly - 1 sets - 2-3 reps - 30 seconds hold - Standing Soleus Stretch on Step  - 1 x daily - 7 x weekly - 1 sets - 2-3 reps - 30 seconds hold  ASSESSMENT: CLINICAL IMPRESSION: Patient continues to improve functional strength including left ankle.  Her balance is improved with less losses & ability to self recover.  Patient continues to benefit from skilled PT.  OBJECTIVE IMPAIRMENTS: Abnormal gait, cardiopulmonary status limiting activity, decreased activity tolerance, decreased balance, decreased coordination, decreased endurance, decreased knowledge of condition, decreased knowledge of  use of DME, decreased mobility, difficulty walking, decreased ROM, decreased strength, increased edema, increased muscle spasms, impaired flexibility, impaired sensation, postural dysfunction, and pain.   ACTIVITY LIMITATIONS: carrying, lifting, bending, standing, squatting, sleeping, stairs, transfers, reach over head, and locomotion level  PARTICIPATION LIMITATIONS: meal prep, cleaning, driving, and community activity  PERSONAL FACTORS: Fitness, Time since onset of injury/illness/exacerbation, and 3+ comorbidities: see PMH  are also affecting patient's functional outcome.   REHAB POTENTIAL: Good  CLINICAL DECISION MAKING: Stable/uncomplicated  EVALUATION COMPLEXITY: Low   GOALS: Goals reviewed with patient? Yes  SHORT TERM GOALS: (target date for Short term goals are 30 days 07/16/2022)   1.  Patient will demonstrate independent use of updated home exercise program to maintain progress from in clinic treatments.  Goal status: MET 07/12/2022  2. Patient amb 200' & neg ramps/curbs without device with supervision.  Goal Status: MET 07/12/2022  LONG TERM GOALS: (target dates for all long term goals are 90 days 08/13/2022)   1. Patient will demonstrate/report pain at worst less than or equal to 2/10 to facilitate minimal limitation in daily activity secondary to pain symptoms. Goal status: Ongoing 07/07/2022   2. Patient will demonstrate independent use of home exercise program to facilitate ability to maintain/progress functional gains from skilled physical therapy services. Goal status: Ongoing 07/07/2022   3. Patient will demonstrate FOTO outcome > or = 47 % to indicate reduced disability due to condition.  Goal status: MET 06/30/2022   4.  Patient will demonstrate LLE MMT 5/5 throughout to faciltiate usual transfers, stairs, squatting at [facility] for daily life.   Goal status: Ongoing 07/07/2022   5.  Patient ambulates >500' with LRAD and neg ramp, curb & stairs single rail modified  independent.  Goal status: New   6.  AROM of Left knee [facility] & left ankle within 75% of right ankle Goal status: Ongoing 07/07/2022   PLAN:  PT FREQUENCY: 2-3 x/week  PT DURATION: 13 weeks (90 days)  PLANNED INTERVENTIONS: Therapeutic exercises, Therapeutic activity, Neuro Muscular re-education, Balance training, Gait training, Patient/Family education, Joint mobilization, Stair training, DME instructions, Dry Needling, Electrical stimulation, Traction, Cryotherapy, vasopneumatic deviceMoist heat, Taping, Ultrasound, Ionotophoresis 4mg /ml Dexamethasone, and Manual therapy.  All included unless contraindicated  PLAN FOR NEXT SESSION:  work towards LTGs, update HEP.  work on gait & balance without device, Cont progressing there exer & therapeutic activities to tolerance including balance activities   Vladimir Faster, PT, DPT 07/12/2022,  9:33 AM

## 2022-07-12 NOTE — Telephone Encounter (Signed)
Prior Authorization has been approved effective 03/15/2022-03/15/2023  Approval has been faxed to the pharmacy.

## 2022-07-13 ENCOUNTER — Other Ambulatory Visit (HOSPITAL_COMMUNITY): Payer: Self-pay

## 2022-07-13 ENCOUNTER — Ambulatory Visit: Payer: Medicare PPO

## 2022-07-13 VITALS — Ht 68.0 in | Wt 109.0 lb

## 2022-07-13 DIAGNOSIS — Z0000 Encounter for general adult medical examination without abnormal findings: Secondary | ICD-10-CM

## 2022-07-13 NOTE — Patient Instructions (Signed)
Carolyn [name] , Thank you for taking time to come for your Medicare Wellness Visit. I appreciate your ongoing commitment to your health goals. Please review the following plan we discussed and let me know if I can assist you in the future.   These are the goals we discussed:  Goals      My goal for 2024 is to get back to riding my horses and enjoy the outdoors with my husband.        This is a list of the screening recommended for you and due dates:  Health Maintenance  Topic Date Due   Colon Cancer Screening  Never done   Zoster (Shingles) Vaccine (1 of 2) Never done   Mammogram  12/28/2008   Pap Smear  01/31/2012   COVID-19 Vaccine (2 - 2023-24 season) 11/13/2021   Pneumonia Vaccine (1 of 1 - PCV) Never done   DEXA scan (bone density measurement)  06/06/2022   Flu Shot  10/14/2022   DTaP/Tdap/Td vaccine (2 - Td or Tdap) 06/13/2030   Hepatitis C Screening: USPSTF Recommendation to screen - Ages 9-79 yo.  Completed   HIV Screening  Completed   HPV Vaccine  Aged Out    Advanced directives: No  Conditions/risks identified: Yes  Next appointment: Follow up in one year for your annual wellness visit.   Preventive Care 65 Years and Older, Female Preventive care refers to lifestyle choices and visits with your health care provider that can promote health and wellness. What does preventive care include? A yearly physical exam. This is also called an annual well check. Dental exams once or twice a year. Routine eye exams. Ask your health care provider how often you should have your eyes checked. Personal lifestyle choices, including: Daily care of your teeth and gums. Regular physical activity. Eating a healthy diet. Avoiding tobacco and drug use. Limiting alcohol use. Practicing safe sex. Taking low-dose aspirin every day. Taking vitamin and mineral supplements as recommended by your health care provider. What happens during an annual well check? The services and screenings  done by your health care provider during your annual well check will depend on your age, overall health, lifestyle risk factors, and family history of disease. Counseling  Your health care provider may ask you questions about your: Alcohol use. Tobacco use. Drug use. Emotional well-being. Home and relationship well-being. Sexual activity. Eating habits. History of falls. Memory and ability to understand (cognition). Work and work Astronomer. Reproductive health. Screening  You may have the following tests or measurements: Height, weight, and BMI. Blood pressure. Lipid and cholesterol levels. These may be checked every 5 years, or more frequently if you are over 1 years old. Skin check. Lung cancer screening. You may have this screening every year starting at age 15 if you have a 30-pack-year history of smoking and currently smoke or have quit within the past 15 years. Fecal occult blood test (FOBT) of the stool. You may have this test every year starting at age 64. Flexible sigmoidoscopy or colonoscopy. You may have a sigmoidoscopy every 5 years or a colonoscopy every 10 years starting at age 64. Hepatitis C blood test. Hepatitis B blood test. Sexually transmitted disease (STD) testing. Diabetes screening. This is done by checking your blood sugar (glucose) after you have not eaten for a while (fasting). You may have this done every 1-3 years. Bone density scan. This is done to screen for osteoporosis. You may have this done starting at age 58. Mammogram. This may  be done every 1-2 years. Talk to your health care provider about how often you should have regular mammograms. Talk with your health care provider about your test results, treatment options, and if necessary, the need for more tests. Vaccines  Your health care provider may recommend certain vaccines, such as: Influenza vaccine. This is recommended every year. Tetanus, diphtheria, and acellular pertussis (Tdap, Td)  vaccine. You may need a Td booster every 10 years. Zoster vaccine. You may need this after age 49. Pneumococcal 13-valent conjugate (PCV13) vaccine. One dose is recommended after age 35. Pneumococcal polysaccharide (PPSV23) vaccine. One dose is recommended after age 50. Talk to your health care provider about which screenings and vaccines you need and how often you need them. This information is not intended to replace advice given to you by your health care provider. Make sure you discuss any questions you have with your health care provider. Document Released: 03/28/2015 Document Revised: 11/19/2015 Document Reviewed: 12/31/2014 Elsevier Interactive Patient Education  2017 [place] Prevention in the Home Falls can cause injuries. They can happen to people of all ages. There are many things you can do to make your home safe and to help prevent falls. What can I do on the outside of my home? Regularly fix the edges of walkways and driveways and fix any cracks. Remove anything that might make you trip as you walk through a door, such as a raised step or threshold. Trim any bushes or trees on the path to your home. Use bright outdoor lighting. Clear any walking paths of anything that might make someone trip, such as rocks or tools. Regularly check to see if handrails are loose or broken. Make sure that both sides of any steps have handrails. Any raised decks and porches should have guardrails on the edges. Have any leaves, snow, or ice cleared regularly. Use sand or salt on walking paths during winter. Clean up any spills in your garage right away. This includes oil or grease spills. What can I do in the bathroom? Use night lights. Install grab bars by the toilet and in the tub and shower. Do not use towel bars as grab bars. Use non-skid mats or decals in the tub or shower. If you need to sit down in the shower, use a plastic, non-slip stool. Keep the floor dry. Clean up any  water that spills on the floor as soon as it happens. Remove soap buildup in the tub or shower regularly. Attach bath mats securely with double-sided non-slip rug tape. Do not have throw rugs and other things on the floor that can make you trip. What can I do in the bedroom? Use night lights. Make sure that you have a light by your bed that is easy to reach. Do not use any sheets or blankets that are too big for your bed. They should not hang down onto the floor. Have a firm chair that has side arms. You can use this for support while you get dressed. Do not have throw rugs and other things on the floor that can make you trip. What can I do in the kitchen? Clean up any spills right away. Avoid walking on wet floors. Keep items that you use a lot in easy-to-reach places. If you need to reach something above you, use a strong step stool that has a grab bar. Keep electrical cords out of the way. Do not use floor polish or wax that makes floors slippery. If you must use  wax, use non-skid floor wax. Do not have throw rugs and other things on the floor that can make you trip. What can I do with my stairs? Do not leave any items on the stairs. Make sure that there are handrails on both sides of the stairs and use them. Fix handrails that are broken or loose. Make sure that handrails are as long as the stairways. Check any carpeting to make sure that it is firmly attached to the stairs. Fix any carpet that is loose or worn. Avoid having throw rugs at the top or bottom of the stairs. If you do have throw rugs, attach them to the floor with carpet tape. Make sure that you have a light switch at the top of the stairs and the bottom of the stairs. If you do not have them, ask someone to add them for you. What else can I do to help prevent falls? Wear shoes that: Do not have high heels. Have rubber bottoms. Are comfortable and fit you well. Are closed at the toe. Do not wear sandals. If you use a  stepladder: Make sure that it is fully opened. Do not climb a closed stepladder. Make sure that both sides of the stepladder are locked into place. Ask someone to hold it for you, if possible. Clearly mark and make sure that you can see: Any grab bars or handrails. First and last steps. Where the edge of each step is. Use tools that help you move around (mobility aids) if they are needed. These include: Canes. Walkers. Scooters. Crutches. Turn on the lights when you go into a dark area. Replace any light bulbs as soon as they burn out. Set up your furniture so you have a clear path. Avoid moving your furniture around. If any of your floors are uneven, fix them. If there are any pets around you, be aware of where they are. Review your medicines with your doctor. Some medicines can make you feel dizzy. This can increase your chance of falling. Ask your doctor what other things that you can do to help prevent falls. This information is not intended to replace advice given to you by your health care provider. Make sure you discuss any questions you have with your health care provider. Document Released: 12/26/2008 Document Revised: 08/07/2015 Document Reviewed: 04/05/2014 Elsevier Interactive Patient Education  2017 [employer].

## 2022-07-13 NOTE — Progress Notes (Addendum)
I connected with  Carolyn [name] on 07/13/22 by a audio enabled telemedicine application and verified that I am speaking with the correct person using two identifiers.  Patient Location: Home  Provider Location: Office/Clinic  I discussed the limitations of evaluation and management by telemedicine. The patient expressed understanding and agreed to proceed.  Subjective:   Carolyn [name] is a 67 y.o. female who presents for an Initial Medicare Annual Wellness Visit.  Review of Systems     Cardiac Risk Factors include: advanced age (>38men, >63 women)     Objective:    Today's Vitals   07/13/22 1515  Weight: 109 lb (49.4 kg)  Height: 5\' 8"  (1.727 m)  PainSc: 5   PainLoc: Abdomen   Body mass index is 16.57 kg/m.     07/13/2022    3:25 PM 07/09/2022    9:41 AM 06/21/2022    9:16 AM 05/17/2022    8:38 AM 05/10/2022   10:18 AM 05/05/2022    1:27 PM 04/21/2022    4:01 AM  Advanced Directives  Does Patient Have a Medical Advance Directive? No No Yes No No No Yes  Type of Advance Directive Out of facility DNR (pink MOST or yellow form)  Out of facility DNR (pink MOST or yellow form)    Out of facility DNR (pink MOST or yellow form)  Does patient want to make changes to medical advance directive?     No - Patient declined  No - Patient declined  Would patient like information on creating a medical advance directive? No - Patient declined No - Patient declined  No - Patient declined  No - Patient declined No - Patient declined  Pre-existing out of facility DNR order (yellow form or pink MOST form)       Physician notified to receive inpatient order    Current Medications (verified) Outpatient Encounter Medications as of 07/13/2022  Medication Sig   acetaminophen (TYLENOL) 325 MG tablet Take 2 tablets (650 mg total) by mouth every 4 (four) hours as needed for mild pain (temp > 101.5).   alendronate (FOSAMAX) 70 MG/75ML solution Take 75 mLs (70 mg total) by mouth every 7 (seven) days.  Take with a full glass of water on an empty stomach.   buprenorphine (BUTRANS) 20 MCG/HR PTWK Place 1 patch onto the skin once a week.   cholecalciferol (CHOLECALCIFEROL) 25 MCG tablet Take 1 tablet (1,000 Units total) by mouth daily.   famotidine (PEPCID) 10 MG tablet Take 10 mg by mouth 2 (two) times daily.   feeding supplement (ENSURE ENLIVE / ENSURE PLUS) LIQD Take 237 mLs by mouth 3 (three) times daily between meals.   furosemide (LASIX) 20 MG tablet Take 1 tablet (20 mg total) by mouth daily.   gabapentin (NEURONTIN) 300 MG capsule Take 1 capsule (300 mg total) by mouth 2 (two) times daily.   HYDROmorphone (DILAUDID) 2 MG tablet Take 1 tablet (2 mg total) by mouth every 12 (twelve) hours as needed for up to 7 days for severe pain.   levothyroxine (SYNTHROID) 50 MCG tablet Take 1 tablet (50 mcg total) by mouth daily at 6 (six) AM.   lipase/protease/amylase (CREON) 12000-38000 units CPEP capsule Take 2 capsules (24,000 Units total) by mouth 3 (three) times daily with meals.   loperamide (IMODIUM) 2 MG capsule Take 1 capsule (2 mg total) by mouth every 6 (six) hours as needed for diarrhea or loose stools.   Melatonin 5 MG CAPS Take 1 capsule by  mouth at bedtime.   metoprolol tartrate (LOPRESSOR) 25 MG tablet Take 0.5 tablets (12.5 mg total) by mouth 2 (two) times daily.   Multiple Vitamin (MULITIVITAMIN WITH MINERALS) TABS Take 1 tablet by mouth daily.   Nutritional Supplements (,FEEDING SUPPLEMENT, PROSOURCE PLUS) liquid Take 30 mLs by mouth 2 (two) times daily between meals.   pantoprazole (PROTONIX) 40 MG tablet Take 1 tablet (40 mg total) by mouth 2 (two) times daily before a meal.   polyethylene glycol (MIRALAX / GLYCOLAX) 17 g packet Take 17 g by mouth 2 (two) times daily as needed for mild constipation.   potassium chloride SA (KLOR-CON M) 20 MEQ tablet Take 1 tablet (20 mEq total) by mouth 2 (two) times daily for 10 days.   pyridOXINE (B-6) 100 MG tablet Take 1 tablet (100 mg total) by  mouth daily.   saccharomyces boulardii (FLORASTOR) 250 MG capsule Take 1 capsule (250 mg total) by mouth 2 (two) times daily.   selenium 100 MCG TABS Take 1 tablet (100 mcg total) by mouth daily.   silver sulfADIAZINE (SILVADENE) 1 % cream Apply topically daily. (Patient taking differently: Apply 1 Application topically daily.)   vitamin A 3 MG (10000 UNITS) capsule Take 1 capsule (10,000 Units total) by mouth every other day.   vitamin B-12 (CYANOCOBALAMIN) 100 MCG tablet Take 100 mcg by mouth daily.   vitamin E 180 MG (400 UNITS) capsule Take 1 capsule (400 Units total) by mouth daily.   zinc sulfate 220 (50 Zn) MG capsule Take 1 capsule (220 mg total) by mouth daily.   [DISCONTINUED] PROMETHAZINE HCL PO Take 4 mg by mouth.    [DISCONTINUED] Ranitidine HCl (ZANTAC PO) Take by mouth.     No facility-administered encounter medications on file as of 07/13/2022.    Allergies (verified) Other, Peanut-containing drug products, and Pecan extract   History: Past Medical History:  Diagnosis Date   Alcoholic cirrhosis (HCC)    Chronic pancreatitis (HCC)    Diabetes mellitus without complication (HCC)    Endometriosis    GERD (gastroesophageal reflux disease)    Intractable nausea and vomiting 06/13/2020   Liver disease    Malabsorption    MALABSORPTION SYNDROME   Osteoporosis 03/2011   t score -2.5   Pancreatitis    due to cyst and tumors due to calcifications   Seizure (HCC)    no seizure disorder, controlled w/ meds per patient   Vitamin D deficiency 2012   VIT D 15   Past Surgical History:  Procedure Laterality Date   APPENDECTOMY     BIOPSY  01/19/2022   Procedure: BIOPSY;  Surgeon: Shellia Cleverly, DO;  Location: [facility];  Service: Gastroenterology;;   CHOLECYSTECTOMY     ESOPHAGOGASTRODUODENOSCOPY (EGD) WITH PROPOFOL N/A 01/19/2022   Procedure: ESOPHAGOGASTRODUODENOSCOPY (EGD) WITH PROPOFOL;  Surgeon: Shellia Cleverly, DO;  Location: [facility];  Service:  Gastroenterology;  Laterality: N/A;   FEEDING TUBE RELOCATION  2010   JEJUNOSTOMY FEEDING TUBE  1990   MULTIPLE GASTRIC SURGERIES     MYOMECTOMY     OPEN REDUCTION INTERNAL FIXATION (ORIF) DISTAL RADIAL FRACTURE Left 06/29/2018   Procedure: OPEN REDUCTION INTERNAL FIXATION (ORIF)LEFT  DISTAL RADIAL FRACTURE;  Surgeon: Betha Loa, MD;  Location: [facility];  Service: Orthopedics;  Laterality: Left;   ROUX-EN-Y GASTRIC BYPASS     TIBIA IM NAIL INSERTION Left 04/21/2022   Procedure: LEFT INTRAMEDULLARY (IM) NAIL TIBIAL;  Surgeon: Eldred Manges, MD;  Location: [facility];  Service:  Orthopedics;  Laterality: Left;   TIBIA IM NAIL INSERTION Left 05/05/2022   Procedure: LEFT TIBIAL NAIL DISTAL SCREW PLACEMENT;  Surgeon: Eldred Manges, MD;  Location: [facility];  Service: Orthopedics;  Laterality: Left;   TUBAL LIGATION     Family History  Problem Relation Age of Onset   Cancer Father        BONE   Breast cancer Maternal Grandmother    Social History   Socioeconomic History   Marital status: Single    Spouse name: Not on file   Number of children: Not on file   Years of education: Not on file   Highest education level: Not on file  Occupational History   Not on file  Tobacco Use   Smoking status: Never   Smokeless tobacco: Never  Vaping Use   Vaping Use: Never used  Substance and Sexual Activity   Alcohol use: Yes    Comment: occ ( none in several weeks)   Drug use: No   Sexual activity: Not Currently    Partners: Male    Birth control/protection: Post-menopausal  Other Topics Concern   Not on file  Social History Narrative   Not on file   Social Determinants of Health   Financial Resource Strain: Low Risk  (07/13/2022)   Overall Financial Resource Strain (CARDIA)    Difficulty of Paying Living Expenses: Not hard at all  Food Insecurity: No Food Insecurity (07/13/2022)   Hunger Vital Sign    Worried About Running Out of Food in the Last Year: Never true    Ran Out of  Food in the Last Year: Never true  Transportation Needs: No Transportation Needs (07/13/2022)   PRAPARE - Administrator, Civil Service (Medical): No    Lack of Transportation (Non-Medical): No  Physical Activity: Insufficiently Active (07/13/2022)   Exercise Vital Sign    Days of Exercise per Week: 2 days    Minutes of Exercise per Session: 40 min  Stress: No Stress Concern Present (07/13/2022)   [employer] of Occupational Health - Occupational Stress Questionnaire    Feeling of Stress : Not at all  Social Connections: Moderately Isolated (07/13/2022)   Social Connection and Isolation Panel [NHANES]    Frequency of Communication with Friends and Family: Patient unable to answer    Frequency of Social Gatherings with Friends and Family: Three times a week    Attends Religious Services: Never    Active Member of Clubs or Organizations: No    Attends Banker Meetings: Never    Marital Status: Living with partner    Tobacco Counseling Counseling given: Not Answered   Clinical Intake:  Pre-visit preparation completed: Yes  Pain : 0-10 Pain Score: 5  Pain Type: Chronic pain Pain Location: Other (Comment) (Mid to Mid Back) Pain Orientation: Mid Pain Descriptors / Indicators: Dull, Constant Pain Onset: More than a month ago Pain Frequency: Constant Pain Relieving Factors: Hydromorphone Effect of Pain on Daily Activities: Pain can diminish job performance, lower motivation to exercise and prevent you from completing daily tasks.  Pain produces disability and affects the quality of life.  Pain Relieving Factors: Hydromorphone  BMI - recorded: 16.57 Nutritional Status: BMI <19  Underweight Nutritional Risks: None Diabetes: No  How often do you need to have someone help you when you read instructions, pamphlets, or other written materials from your doctor or pharmacy?: 1 - Never What is the last grade level you completed in school?: Law Degree  from  [employer])  Diabetic? No  Interpreter Needed?: No  Information entered by :: Susie Cassette, LPN.   Activities of Daily Living    07/13/2022    3:47 PM 07/09/2022    9:41 AM  In your present state of health, do you have any difficulty performing the following activities:  Hearing? 0 0  Vision? 0 0  Difficulty concentrating or making decisions? 0 0  Walking or climbing stairs? 1 1  Dressing or bathing? 0 0  Doing errands, shopping? 0 0  Preparing Food and eating ? N   Using the Toilet? N   In the past six months, have you accidently leaked urine? N   Do you have problems with loss of bowel control? N   Managing your Medications? N   Managing your Finances? N   Housekeeping or managing your Housekeeping? N     Patient Care Team: Adron Bene, MD as PCP - General (Internal Medicine) Edsel Petrin, DO as Consulting Physician (Internal Medicine) Earnstine Regal, OD as Consulting Physician (Optometry)  Indicate any recent Medical Services you may have received from other than [facility] providers in the past year (date may be approximate).     Assessment:   This is a routine wellness examination for Daijanae.  Hearing/Vision screen Hearing Screening - Comments:: Denies hearing difficulties.   Vision Screening - Comments:: Wears rx glasses - up to date with routine eye exams with Herbert Moors, OD.   Dietary issues and exercise activities discussed: Current Exercise Habits: Structured exercise class, Type of exercise: Other - see comments (Physical Therapy), Time (Minutes): 40, Frequency (Times/Week): 2, Weekly Exercise (Minutes/Week): 80, Intensity: Mild, Exercise limited by: None identified   Goals Addressed             This Visit's Progress    My goal for 2024 is to get back to riding my horses and enjoy the outdoors with my husband.         Depression Screen    07/13/2022    3:44 PM 07/09/2022    9:40 AM 06/21/2022    9:07 AM  07/22/2020    4:16 PM  PHQ 2/9 Scores  PHQ - 2 Score 0 0 0   PHQ- 9 Score 0        Information is confidential and restricted. Go to Review Flowsheets to unlock data.    Fall Risk    07/13/2022    3:47 PM 07/09/2022    9:40 AM 06/30/2022    3:27 PM 06/21/2022    9:15 AM  Fall Risk   Falls in the past year? 1 1 1 1   Number falls in past yr: 1 1 0 0  Injury with Fall? 0 0 1 1  Risk for fall due to : History of fall(s);Impaired balance/gait History of fall(s);Impaired balance/gait History of fall(s);Impaired balance/gait Other (Comment)  Follow up Education provided;Falls prevention discussed Falls evaluation completed;Falls prevention discussed Falls evaluation completed Falls evaluation completed;Education provided    FALL RISK PREVENTION PERTAINING TO THE HOME:  Any stairs in or around the home? No  If so, are there any without handrails? No  Home free of loose throw rugs in walkways, pet beds, electrical cords, etc? Yes  Adequate lighting in your home to reduce risk of falls? Yes   ASSISTIVE DEVICES UTILIZED TO PREVENT FALLS:  Life alert? No  Use of a cane, walker or w/c? Yes  Grab bars in the bathroom? Yes  Shower chair or  bench in shower? Yes  Elevated toilet seat or a handicapped toilet? Yes   TIMED UP AND GO:  Was the test performed? No . Telephonic Visit  Cognitive Function:        07/13/2022    3:32 PM  6CIT Screen  What Year? 0 points  What month? 0 points  What time? 0 points  Count back from 20 0 points  Months in reverse 0 points  Repeat phrase 0 points  Total Score 0 points    Immunizations Immunization History  Administered Date(s) Administered   Influenza Split 03/11/2011   Influenza,inj,Quad PF,6+ Mos 03/29/2022   Tdap 06/12/2020    TDAP status: Up to date  Flu Vaccine status: Up to date  Pneumococcal vaccine status: Due, Education has been provided regarding the importance of this vaccine. Advised may receive this vaccine at local  pharmacy or Health Dept. Aware to provide a copy of the vaccination record if obtained from local pharmacy or Health Dept. Verbalized acceptance and understanding.  Covid-19 vaccine status: Information provided on how to obtain vaccines.   Qualifies for Shingles Vaccine? Yes   Zostavax completed No   Shingrix Completed?: No.    Education has been provided regarding the importance of this vaccine. Patient has been advised to call insurance company to determine out of pocket expense if they have not yet received this vaccine. Advised may also receive vaccine at local pharmacy or Health Dept. Verbalized acceptance and understanding.  Screening Tests Health Maintenance  Topic Date Due   COVID-19 Vaccine (1) Never done   COLONOSCOPY (Pts 45-75yrs Insurance coverage will need to be confirmed)  Never done   Zoster Vaccines- Shingrix (1 of 2) Never done   MAMMOGRAM  12/28/2008   PAP SMEAR-Modifier  01/31/2012   Pneumonia Vaccine 43+ Years old (1 of 1 - PCV) Never done   DEXA SCAN  06/06/2022   INFLUENZA VACCINE  10/14/2022   DTaP/Tdap/Td (2 - Td or Tdap) 06/13/2030   Hepatitis C Screening  Completed   HIV Screening  Completed   HPV VACCINES  Aged Out    Health Maintenance  Health Maintenance Due  Topic Date Due   COVID-19 Vaccine (1) Never done   COLONOSCOPY (Pts 45-80yrs Insurance coverage will need to be confirmed)  Never done   Zoster Vaccines- Shingrix (1 of 2) Never done   MAMMOGRAM  12/28/2008   PAP SMEAR-Modifier  01/31/2012   Pneumonia Vaccine 59+ Years old (1 of 1 - PCV) Never done   DEXA SCAN  06/06/2022   Colorectal Screening status: Patient stated that she has spoken with PCP and will be referred to [facility] for consult.  Mammogram status: Completed 04/25/2013. Repeat every year  Bone Density status: Ordered 06/21/2022. Pt provided with contact info and advised to call to schedule appt.  Lung Cancer Screening: (Low Dose CT Chest recommended if Age 90-80 years, 30  pack-year currently smoking OR have quit w/in 15years.) does not qualify.   Lung Cancer Screening Referral: No  Additional Screening:  Hepatitis C Screening: does qualify; Completed 03/15/2022  Vision Screening: Recommended annual ophthalmology exams for early detection of glaucoma and other disorders of the eye. Is the patient up to date with their annual eye exam?  Yes  Who is the provider or what is the name of the office in which the patient attends annual eye exams? Ginette Otto, OD. If pt is not established with a provider, would they like to be referred to a provider to establish  care? No .   Dental Screening: Recommended annual dental exams for proper oral hygiene  Community Resource Referral / Chronic Care Management: CRR required this visit?  No   CCM required this visit?  No      Plan:     I have personally reviewed and noted the following in the patient's chart:   Medical and social history Use of alcohol, tobacco or illicit drugs  Current medications and supplements including opioid prescriptions. Patient is currently taking opioid prescriptions. Information provided to patient regarding non-opioid alternatives. Patient advised to discuss non-opioid treatment plan with their provider. Functional ability and status Nutritional status Physical activity Advanced directives List of other physicians Hospitalizations, surgeries, and ER visits in previous 12 months Vitals Screenings to include cognitive, depression, and falls Referrals and appointments  In addition, I have reviewed and discussed with patient certain preventive protocols, quality metrics, and best practice recommendations. A written personalized care plan for preventive services as well as general preventive health recommendations were provided to patient.     Mickeal Needy, LPN   1/61/0960   Nurse Notes:  Normal cognitive status assessed by direct observation via telephone conversation by this  Nurse Health Advisor. No abnormalities found.   Not able to sign visit on date of completion 07/13/2022.  Advised by supervisor to complete next day of business.        Subjective:   Carolyn [name] is a 67 y.o. female who presents for an Initial Medicare Annual Wellness Visit.  Review of Systems     Cardiac Risk Factors include: advanced age (>64men, >61 women)     Objective:    Today's Vitals   07/13/22 1515  Weight: 109 lb (49.4 kg)  Height: 5\' 8"  (1.727 m)  PainSc: 5   PainLoc: Abdomen   Body mass index is 16.57 kg/m.     07/13/2022    3:25 PM 07/09/2022    9:41 AM 06/21/2022    9:16 AM 05/17/2022    8:38 AM 05/10/2022   10:18 AM 05/05/2022    1:27 PM 04/21/2022    4:01 AM  Advanced Directives  Does Patient Have a Medical Advance Directive? No No Yes No No No Yes  Type of Advance Directive Out of facility DNR (pink MOST or yellow form)  Out of facility DNR (pink MOST or yellow form)    Out of facility DNR (pink MOST or yellow form)  Does patient want to make changes to medical advance directive?     No - Patient declined  No - Patient declined  Would patient like information on creating a medical advance directive? No - Patient declined No - Patient declined  No - Patient declined  No - Patient declined No - Patient declined  Pre-existing out of facility DNR order (yellow form or pink MOST form)       Physician notified to receive inpatient order    Current Medications (verified) Outpatient Encounter Medications as of 07/13/2022  Medication Sig   acetaminophen (TYLENOL) 325 MG tablet Take 2 tablets (650 mg total) by mouth every 4 (four) hours as needed for mild pain (temp > 101.5).   alendronate (FOSAMAX) 70 MG/75ML solution Take 75 mLs (70 mg total) by mouth every 7 (seven) days. Take with a full glass of water on an empty stomach.   buprenorphine (BUTRANS) 20 MCG/HR PTWK Place 1 patch onto the skin once a week.   cholecalciferol (CHOLECALCIFEROL) 25 MCG tablet Take 1  tablet (1,000 Units total)  by mouth daily.   famotidine (PEPCID) 10 MG tablet Take 10 mg by mouth 2 (two) times daily.   feeding supplement (ENSURE ENLIVE / ENSURE PLUS) LIQD Take 237 mLs by mouth 3 (three) times daily between meals.   furosemide (LASIX) 20 MG tablet Take 1 tablet (20 mg total) by mouth daily.   gabapentin (NEURONTIN) 300 MG capsule Take 1 capsule (300 mg total) by mouth 2 (two) times daily.   HYDROmorphone (DILAUDID) 2 MG tablet Take 1 tablet (2 mg total) by mouth every 12 (twelve) hours as needed for up to 7 days for severe pain.   levothyroxine (SYNTHROID) 50 MCG tablet Take 1 tablet (50 mcg total) by mouth daily at 6 (six) AM.   lipase/protease/amylase (CREON) 12000-38000 units CPEP capsule Take 2 capsules (24,000 Units total) by mouth 3 (three) times daily with meals.   loperamide (IMODIUM) 2 MG capsule Take 1 capsule (2 mg total) by mouth every 6 (six) hours as needed for diarrhea or loose stools.   Melatonin 5 MG CAPS Take 1 capsule by mouth at bedtime.   metoprolol tartrate (LOPRESSOR) 25 MG tablet Take 0.5 tablets (12.5 mg total) by mouth 2 (two) times daily.   Multiple Vitamin (MULITIVITAMIN WITH MINERALS) TABS Take 1 tablet by mouth daily.   Nutritional Supplements (,FEEDING SUPPLEMENT, PROSOURCE PLUS) liquid Take 30 mLs by mouth 2 (two) times daily between meals.   pantoprazole (PROTONIX) 40 MG tablet Take 1 tablet (40 mg total) by mouth 2 (two) times daily before a meal.   polyethylene glycol (MIRALAX / GLYCOLAX) 17 g packet Take 17 g by mouth 2 (two) times daily as needed for mild constipation.   potassium chloride SA (KLOR-CON M) 20 MEQ tablet Take 1 tablet (20 mEq total) by mouth 2 (two) times daily for 10 days.   pyridOXINE (B-6) 100 MG tablet Take 1 tablet (100 mg total) by mouth daily.   saccharomyces boulardii (FLORASTOR) 250 MG capsule Take 1 capsule (250 mg total) by mouth 2 (two) times daily.   selenium 100 MCG TABS Take 1 tablet (100 mcg total) by mouth  daily.   silver sulfADIAZINE (SILVADENE) 1 % cream Apply topically daily. (Patient taking differently: Apply 1 Application topically daily.)   vitamin A 3 MG (10000 UNITS) capsule Take 1 capsule (10,000 Units total) by mouth every other day.   vitamin B-12 (CYANOCOBALAMIN) 100 MCG tablet Take 100 mcg by mouth daily.   vitamin E 180 MG (400 UNITS) capsule Take 1 capsule (400 Units total) by mouth daily.   zinc sulfate 220 (50 Zn) MG capsule Take 1 capsule (220 mg total) by mouth daily.   [DISCONTINUED] PROMETHAZINE HCL PO Take 4 mg by mouth.    [DISCONTINUED] Ranitidine HCl (ZANTAC PO) Take by mouth.     No facility-administered encounter medications on file as of 07/13/2022.    Allergies (verified) Other, Peanut-containing drug products, and Pecan extract   History: Past Medical History:  Diagnosis Date   Alcoholic cirrhosis (HCC)    Chronic pancreatitis (HCC)    Diabetes mellitus without complication (HCC)    Endometriosis    GERD (gastroesophageal reflux disease)    Intractable nausea and vomiting 06/13/2020   Liver disease    Malabsorption    MALABSORPTION SYNDROME   Osteoporosis 03/2011   t score -2.5   Pancreatitis    due to cyst and tumors due to calcifications   Seizure (HCC)    no seizure disorder, controlled w/ meds per patient   Vitamin D  deficiency 2012   VIT D 15   Past Surgical History:  Procedure Laterality Date   APPENDECTOMY     BIOPSY  01/19/2022   Procedure: BIOPSY;  Surgeon: Shellia Cleverly, DO;  Location: [facility];  Service: Gastroenterology;;   CHOLECYSTECTOMY     ESOPHAGOGASTRODUODENOSCOPY (EGD) WITH PROPOFOL N/A 01/19/2022   Procedure: ESOPHAGOGASTRODUODENOSCOPY (EGD) WITH PROPOFOL;  Surgeon: Shellia Cleverly, DO;  Location: [facility];  Service: Gastroenterology;  Laterality: N/A;   FEEDING TUBE RELOCATION  2010   JEJUNOSTOMY FEEDING TUBE  1990   MULTIPLE GASTRIC SURGERIES     MYOMECTOMY     OPEN REDUCTION INTERNAL FIXATION (ORIF) DISTAL  RADIAL FRACTURE Left 06/29/2018   Procedure: OPEN REDUCTION INTERNAL FIXATION (ORIF)LEFT  DISTAL RADIAL FRACTURE;  Surgeon: Betha Loa, MD;  Location: [facility];  Service: Orthopedics;  Laterality: Left;   ROUX-EN-Y GASTRIC BYPASS     TIBIA IM NAIL INSERTION Left 04/21/2022   Procedure: LEFT INTRAMEDULLARY (IM) NAIL TIBIAL;  Surgeon: Eldred Manges, MD;  Location: [facility];  Service: Orthopedics;  Laterality: Left;   TIBIA IM NAIL INSERTION Left 05/05/2022   Procedure: LEFT TIBIAL NAIL DISTAL SCREW PLACEMENT;  Surgeon: Eldred Manges, MD;  Location: [facility];  Service: Orthopedics;  Laterality: Left;   TUBAL LIGATION     Family History  Problem Relation Age of Onset   Cancer Father        BONE   Breast cancer Maternal Grandmother    Social History   Socioeconomic History   Marital status: Single    Spouse name: Not on file   Number of children: Not on file   Years of education: Not on file   Highest education level: Not on file  Occupational History   Not on file  Tobacco Use   Smoking status: Never   Smokeless tobacco: Never  Vaping Use   Vaping Use: Never used  Substance and Sexual Activity   Alcohol use: Yes    Comment: occ ( none in several weeks)   Drug use: No   Sexual activity: Not Currently    Partners: Male    Birth control/protection: Post-menopausal  Other Topics Concern   Not on file  Social History Narrative   Not on file   Social Determinants of Health   Financial Resource Strain: Low Risk  (07/13/2022)   Overall Financial Resource Strain (CARDIA)    Difficulty of Paying Living Expenses: Not hard at all  Food Insecurity: No Food Insecurity (07/13/2022)   Hunger Vital Sign    Worried About Running Out of Food in the Last Year: Never true    Ran Out of Food in the Last Year: Never true  Transportation Needs: No Transportation Needs (07/13/2022)   PRAPARE - Administrator, Civil Service (Medical): No    Lack of Transportation  (Non-Medical): No  Physical Activity: Insufficiently Active (07/13/2022)   Exercise Vital Sign    Days of Exercise per Week: 2 days    Minutes of Exercise per Session: 40 min  Stress: No Stress Concern Present (07/13/2022)   [employer] of Occupational Health - Occupational Stress Questionnaire    Feeling of Stress : Not at all  Social Connections: Moderately Isolated (07/13/2022)   Social Connection and Isolation Panel [NHANES]    Frequency of Communication with Friends and Family: Patient unable to answer    Frequency of Social Gatherings with Friends and Family: Three times a week    Attends Religious Services:  Never    Active Member of Clubs or Organizations: No    Attends Banker Meetings: Never    Marital Status: Living with partner    Tobacco Counseling Counseling given: Not Answered   Clinical Intake:  Pre-visit preparation completed: Yes  Pain : 0-10 Pain Score: 5  Pain Type: Chronic pain Pain Location: Other (Comment) (Mid to Mid Back) Pain Orientation: Mid Pain Descriptors / Indicators: Dull, Constant Pain Onset: More than a month ago Pain Frequency: Constant Pain Relieving Factors: Hydromorphone Effect of Pain on Daily Activities: Pain can diminish job performance, lower motivation to exercise and prevent you from completing daily tasks.  Pain produces disability and affects the quality of life.  Pain Relieving Factors: Hydromorphone  BMI - recorded: 16.57 Nutritional Status: BMI <19  Underweight Nutritional Risks: None Diabetes: No  How often do you need to have someone help you when you read instructions, pamphlets, or other written materials from your doctor or pharmacy?: 1 - Never What is the last grade level you completed in school?: Law Degree from [employer])  Diabetic? No  Interpreter Needed?: No  Information entered by :: Susie Cassette, LPN.   Activities of Daily Living    07/13/2022    3:47 PM  07/09/2022    9:41 AM  In your present state of health, do you have any difficulty performing the following activities:  Hearing? 0 0  Vision? 0 0  Difficulty concentrating or making decisions? 0 0  Walking or climbing stairs? 1 1  Dressing or bathing? 0 0  Doing errands, shopping? 0 0  Preparing Food and eating ? N   Using the Toilet? N   In the past six months, have you accidently leaked urine? N   Do you have problems with loss of bowel control? N   Managing your Medications? N   Managing your Finances? N   Housekeeping or managing your Housekeeping? N     Patient Care Team: Adron Bene, MD as PCP - General (Internal Medicine) Edsel Petrin, DO as Consulting Physician (Internal Medicine) Earnstine Regal, OD as Consulting Physician (Optometry)  Indicate any recent Medical Services you may have received from other than [facility] providers in the past year (date may be approximate).     Assessment:   This is a routine wellness examination for Carolyn [name].  Hearing/Vision screen Hearing Screening - Comments:: Denies hearing difficulties.   Vision Screening - Comments:: Wears rx glasses - up to date with routine eye exams with Herbert Moors, OD.   Dietary issues and exercise activities discussed: Current Exercise Habits: Structured exercise class, Type of exercise: Other - see comments (Physical Therapy), Time (Minutes): 40, Frequency (Times/Week): 2, Weekly Exercise (Minutes/Week): 80, Intensity: Mild, Exercise limited by: None identified   Goals Addressed             This Visit's Progress    My goal for 2024 is to get back to riding my horses and enjoy the outdoors with my husband.        Depression Screen    07/13/2022    3:44 PM 07/09/2022    9:40 AM 06/21/2022    9:07 AM 07/22/2020    4:16 PM  PHQ 2/9 Scores  PHQ - 2 Score 0 0 0   PHQ- 9 Score 0        Information is confidential and restricted. Go to Review Flowsheets to unlock data.     Fall Risk     07/13/2022  3:47 PM 07/09/2022    9:40 AM 06/30/2022    3:27 PM 06/21/2022    9:15 AM  Fall Risk   Falls in the past year? 1 1 1 1   Number falls in past yr: 1 1 0 0  Injury with Fall? 0 0 1 1  Risk for fall due to : History of fall(s);Impaired balance/gait History of fall(s);Impaired balance/gait History of fall(s);Impaired balance/gait Other (Comment)  Follow up Education provided;Falls prevention discussed Falls evaluation completed;Falls prevention discussed Falls evaluation completed Falls evaluation completed;Education provided    FALL RISK PREVENTION PERTAINING TO THE HOME:  Any stairs in or around the home? Yes  If so, are there any without handrails? No  Home free of loose throw rugs in walkways, pet beds, electrical cords, etc? Yes  Adequate lighting in your home to reduce risk of falls? Yes   ASSISTIVE DEVICES UTILIZED TO PREVENT FALLS:  Life alert? No  Use of a cane, walker or w/c? Yes  Grab bars in the bathroom? Yes  Shower chair or bench in shower? Yes  Elevated toilet seat or a handicapped toilet? Yes   TIMED UP AND GO:  Was the test performed? No . Telephonic Visit  Cognitive Function:        07/13/2022    3:32 PM  6CIT Screen  What Year? 0 points  What month? 0 points  What time? 0 points  Count back from 20 0 points  Months in reverse 0 points  Repeat phrase 0 points  Total Score 0 points    Immunizations Immunization History  Administered Date(s) Administered   Influenza Split 03/11/2011   Influenza,inj,Quad PF,6+ Mos 03/29/2022   PFIZER(Purple Top)SARS-COV-2 Vaccination 10/01/2019   Tdap 06/12/2020    TDAP status: Up to date  Flu Vaccine status: Up to date  Pneumococcal vaccine status: Due, Education has been provided regarding the importance of this vaccine. Advised may receive this vaccine at local pharmacy or Health Dept. Aware to provide a copy of the vaccination record if obtained from local pharmacy or Health Dept. Verbalized  acceptance and understanding.  Covid-19 vaccine status: Information provided on how to obtain vaccines.   Qualifies for Shingles Vaccine? Yes   Zostavax completed No   Shingrix Completed?: No.    Education has been provided regarding the importance of this vaccine. Patient has been advised to call insurance company to determine out of pocket expense if they have not yet received this vaccine. Advised may also receive vaccine at local pharmacy or Health Dept. Verbalized acceptance and understanding.  Screening Tests Health Maintenance  Topic Date Due   COLONOSCOPY (Pts 45-62yrs Insurance coverage will need to be confirmed)  Never done   Zoster Vaccines- Shingrix (1 of 2) Never done   MAMMOGRAM  12/28/2008   PAP SMEAR-Modifier  01/31/2012   COVID-19 Vaccine (2 - 2023-24 season) 11/13/2021   Pneumonia Vaccine 35+ Years old (1 of 1 - PCV) Never done   DEXA SCAN  06/06/2022   INFLUENZA VACCINE  10/14/2022   DTaP/Tdap/Td (2 - Td or Tdap) 06/13/2030   Hepatitis C Screening  Completed   HIV Screening  Completed   HPV VACCINES  Aged Out    Health Maintenance  Health Maintenance Due  Topic Date Due   COLONOSCOPY (Pts 45-17yrs Insurance coverage will need to be confirmed)  Never done   Zoster Vaccines- Shingrix (1 of 2) Never done   MAMMOGRAM  12/28/2008   PAP SMEAR-Modifier  01/31/2012   COVID-19 Vaccine (2 -  2023-24 season) 11/13/2021   Pneumonia Vaccine 21+ Years old (1 of 1 - PCV) Never done   DEXA SCAN  06/06/2022    Colorectal Cancer screening: Never done or no record  Mammogram status: Completed 04/25/2013. Repeat every year  Bone Density status: Ordered 06/21/2022 by pcp. Pt provided with contact info and advised to call to schedule appt.  Lung Cancer Screening: (Low Dose CT Chest recommended if Age 61-80 years, 30 pack-year currently smoking OR have quit w/in 15years.) does not qualify.   Lung Cancer Screening Referral: No  Additional Screening:  Hepatitis C Screening:  does qualify; Completed 03/15/2022  Vision Screening: Recommended annual ophthalmology exams for early detection of glaucoma and other disorders of the eye. Is the patient up to date with their annual eye exam?  Yes  Who is the provider or what is the name of the office in which the patient attends annual eye exams? Ginette Otto, OD. If pt is not established with a provider, would they like to be referred to a provider to establish care? No .   Dental Screening: Recommended annual dental exams for proper oral hygiene  Community Resource Referral / Chronic Care Management: CRR required this visit?  No   CCM required this visit?  No      Plan:     I have personally reviewed and noted the following in the patient's chart:   Medical and social history Use of alcohol, tobacco or illicit drugs  Current medications and supplements including opioid prescriptions. Patient is currently taking opioid prescriptions. Information provided to patient regarding non-opioid alternatives. Patient advised to discuss non-opioid treatment plan with their provider. Functional ability and status Nutritional status Physical activity Advanced directives List of other physicians Hospitalizations, surgeries, and ER visits in previous 12 months Vitals Screenings to include cognitive, depression, and falls Referrals and appointments  In addition, I have reviewed and discussed with patient certain preventive protocols, quality metrics, and best practice recommendations. A written personalized care plan for preventive services as well as general preventive health recommendations were provided to patient.     Mickeal Needy, LPN   03/20/1094   Nurse Notes:  Note was completed on 07/13/2022.  Not able to sign due to issues with sending to PCP on file.  Normal cognitive status assessed by direct observation via telephone conversation by this Nurse Health Advisor. No abnormalities found.

## 2022-07-14 ENCOUNTER — Encounter: Payer: Self-pay | Admitting: Physical Therapy

## 2022-07-14 ENCOUNTER — Other Ambulatory Visit: Payer: Self-pay

## 2022-07-14 ENCOUNTER — Emergency Department (HOSPITAL_COMMUNITY)
Admission: EM | Admit: 2022-07-14 | Discharge: 2022-07-14 | Disposition: A | Discharge status: Home / Self Care | Payer: Medicare PPO | Attending: Emergency Medicine | Admitting: Emergency Medicine

## 2022-07-14 ENCOUNTER — Ambulatory Visit: Payer: Medicare PPO | Admitting: Physical Therapy

## 2022-07-14 ENCOUNTER — Emergency Department (HOSPITAL_COMMUNITY): Payer: Medicare PPO

## 2022-07-14 ENCOUNTER — Telehealth: Payer: Self-pay | Admitting: Radiology

## 2022-07-14 ENCOUNTER — Encounter (HOSPITAL_COMMUNITY): Payer: Self-pay

## 2022-07-14 DIAGNOSIS — Z91010 Allergy to peanuts: Secondary | ICD-10-CM | POA: Insufficient documentation

## 2022-07-14 DIAGNOSIS — M25672 Stiffness of left ankle, not elsewhere classified: Secondary | ICD-10-CM

## 2022-07-14 DIAGNOSIS — R0789 Other chest pain: Secondary | ICD-10-CM | POA: Insufficient documentation

## 2022-07-14 DIAGNOSIS — R002 Palpitations: Secondary | ICD-10-CM | POA: Diagnosis not present

## 2022-07-14 DIAGNOSIS — R079 Chest pain, unspecified: Secondary | ICD-10-CM

## 2022-07-14 DIAGNOSIS — R944 Abnormal results of kidney function studies: Secondary | ICD-10-CM | POA: Diagnosis not present

## 2022-07-14 DIAGNOSIS — R000 Tachycardia, unspecified: Secondary | ICD-10-CM | POA: Insufficient documentation

## 2022-07-14 DIAGNOSIS — I509 Heart failure, unspecified: Secondary | ICD-10-CM | POA: Insufficient documentation

## 2022-07-14 DIAGNOSIS — E119 Type 2 diabetes mellitus without complications: Secondary | ICD-10-CM | POA: Insufficient documentation

## 2022-07-14 DIAGNOSIS — Z79899 Other long term (current) drug therapy: Secondary | ICD-10-CM | POA: Insufficient documentation

## 2022-07-14 DIAGNOSIS — R2689 Other abnormalities of gait and mobility: Secondary | ICD-10-CM

## 2022-07-14 DIAGNOSIS — M6281 Muscle weakness (generalized): Secondary | ICD-10-CM

## 2022-07-14 DIAGNOSIS — M25572 Pain in left ankle and joints of left foot: Secondary | ICD-10-CM

## 2022-07-14 DIAGNOSIS — R112 Nausea with vomiting, unspecified: Secondary | ICD-10-CM | POA: Insufficient documentation

## 2022-07-14 DIAGNOSIS — M25562 Pain in left knee: Secondary | ICD-10-CM

## 2022-07-14 DIAGNOSIS — R2681 Unsteadiness on feet: Secondary | ICD-10-CM

## 2022-07-14 LAB — COMPREHENSIVE METABOLIC PANEL
ALT: 20 U/L (ref 0–44)
AST: 28 U/L (ref 15–41)
Albumin: 3.3 g/dL — ABNORMAL LOW (ref 3.5–5.0)
Alkaline Phosphatase: 192 U/L — ABNORMAL HIGH (ref 38–126)
Anion gap: 10 (ref 5–15)
BUN: 42 mg/dL — ABNORMAL HIGH (ref 8–23)
CO2: 28 mmol/L (ref 22–32)
Calcium: 9.2 mg/dL (ref 8.9–10.3)
Chloride: 94 mmol/L — ABNORMAL LOW (ref 98–111)
Creatinine, Ser: 1.27 mg/dL — ABNORMAL HIGH (ref 0.44–1.00)
GFR, Estimated: 47 mL/min — ABNORMAL LOW (ref >60)
Glucose, Bld: 84 mg/dL (ref 70–99)
Potassium: 4 mmol/L (ref 3.5–5.1)
Sodium: 132 mmol/L — ABNORMAL LOW (ref 135–145)
Total Bilirubin: 0.9 mg/dL (ref 0.3–1.2)
Total Protein: 6.8 g/dL (ref 6.5–8.1)

## 2022-07-14 LAB — CBC
HCT: 34.7 % — ABNORMAL LOW (ref 36.0–46.0)
Hemoglobin: 11.5 g/dL — ABNORMAL LOW (ref 12.0–15.0)
MCH: 29.4 pg (ref 26.0–34.0)
MCHC: 33.1 g/dL (ref 30.0–36.0)
MCV: 88.7 fL (ref 80.0–100.0)
Platelets: 240 10*3/uL (ref 150–400)
RBC: 3.91 MIL/uL (ref 3.87–5.11)
RDW: 13.1 % (ref 11.5–15.5)
WBC: 7.8 10*3/uL (ref 4.0–10.5)
nRBC: 0 % (ref 0.0–0.2)

## 2022-07-14 LAB — TROPONIN I (HIGH SENSITIVITY)
Troponin I (High Sensitivity): 7 ng/L (ref <18)
Troponin I (High Sensitivity): 6 ng/L (ref <18)

## 2022-07-14 LAB — T4, FREE: Free T4: 1.07 ng/dL (ref 0.61–1.12)

## 2022-07-14 LAB — TSH: TSH: 0.036 u[IU]/mL — ABNORMAL LOW (ref 0.350–4.500)

## 2022-07-14 MED ORDER — PANTOPRAZOLE SODIUM 40 MG IV SOLR
40.0000 mg | Freq: Once | INTRAVENOUS | Status: AC
  Administered 2022-07-14: 40 mg via INTRAVENOUS
  Filled 2022-07-14: qty 10

## 2022-07-14 MED ORDER — SODIUM CHLORIDE 0.9 % IV BOLUS
500.0000 mL | Freq: Once | INTRAVENOUS | Status: AC
  Administered 2022-07-14: 500 mL via INTRAVENOUS

## 2022-07-14 NOTE — Discharge Instructions (Signed)
Make sure that you make an appointment to follow-up with your primary care doctor within the next few days.  Your kidney function was a little bit strained although this may be related to your dehydration.  This to be rechecked by your primary care doctor.  You also need to have your thyroid test rechecked.  Return to the emergency room if you have any worsening symptoms.

## 2022-07-14 NOTE — ED Notes (Signed)
Pt transported to xray 

## 2022-07-14 NOTE — ED Provider Notes (Signed)
[place] EMERGENCY DEPARTMENT AT [facility] Provider Note   CSN: [id number] Arrival date & time: 07/14/22  4098     History  Chief Complaint  Patient presents with   Palpitations    Carolyn [name] is a 67 y.o. female.  Patient is a 67 year old female who presents with chest discomfort.  Per chart review, she has a history of CHF, alcoholic cirrhosis, diabetes, chronic pancreatitis.  She was going to physical therapy this morning and on her way and had developed some discomfort in her chest.  She says it was fairly intense for about 5 minutes.  It was a dull achy feeling in the center of her chest.  She said when she got in there, she felt nauseated and had 1 episode of vomiting.  She says since that time her chest pain has eased off and is very mild now.  It was noted that her heart rate was a bit elevated.  She had no associated shortness of breath.  She does have a history of indigestion and pancreatitis but says this did not feel similar to those episodes.  No recent fevers.  No cough or cold symptoms.  Of note, she was out of her Lasix for about 2 days but restarted it yesterday.       Home Medications Prior to Admission medications   Medication Sig Start Date End Date Taking? Authorizing Provider  acetaminophen (TYLENOL) 325 MG tablet Take 2 tablets (650 mg total) by mouth every 4 (four) hours as needed for mild pain (temp > 101.5). 04/12/22   Alwyn Ren, MD  alendronate (FOSAMAX) 70 MG/75ML solution Take 75 mLs (70 mg total) by mouth every 7 (seven) days. Take with a full glass of water on an empty stomach. 06/30/22   Adron Bene, MD  buprenorphine (BUTRANS) 20 MCG/HR PTWK Place 1 patch onto the skin once a week. 07/03/22   Adron Bene, MD  cholecalciferol (CHOLECALCIFEROL) 25 MCG tablet Take 1 tablet (1,000 Units total) by mouth daily. 04/13/22   Alwyn Ren, MD  famotidine (PEPCID) 10 MG tablet Take 10 mg by mouth 2 (two) times daily.     [provider]  feeding supplement (ENSURE ENLIVE / ENSURE PLUS) LIQD Take 237 mLs by mouth 3 (three) times daily between meals. 04/12/22   Alwyn Ren, MD  furosemide (LASIX) 20 MG tablet Take 1 tablet (20 mg total) by mouth daily. 07/12/22   Adron Bene, MD  gabapentin (NEURONTIN) 300 MG capsule Take 1 capsule (300 mg total) by mouth 2 (two) times daily. 06/02/22   Edsel Petrin, DO  HYDROmorphone (DILAUDID) 2 MG tablet Take 1 tablet (2 mg total) by mouth every 12 (twelve) hours as needed for up to 7 days for severe pain. 07/12/22 07/19/22  Adron Bene, MD  levothyroxine (SYNTHROID) 50 MCG tablet Take 1 tablet (50 mcg total) by mouth daily at 6 (six) AM. 06/02/22   Edsel Petrin, DO  lipase/protease/amylase (CREON) 12000-38000 units CPEP capsule Take 2 capsules (24,000 Units total) by mouth 3 (three) times daily with meals. 06/02/22   Edsel Petrin, DO  loperamide (IMODIUM) 2 MG capsule Take 1 capsule (2 mg total) by mouth every 6 (six) hours as needed for diarrhea or loose stools. 04/12/22   Alwyn Ren, MD  Melatonin 5 MG CAPS Take 1 capsule by mouth at bedtime.    [provider]  metoprolol tartrate (LOPRESSOR) 25 MG tablet Take 0.5 tablets (12.5 mg total) by  mouth 2 (two) times daily. 06/02/22 07/02/22  Edsel Petrin, DO  Multiple Vitamin (MULITIVITAMIN WITH MINERALS) TABS Take 1 tablet by mouth daily.    [provider]  Nutritional Supplements (,FEEDING SUPPLEMENT, PROSOURCE PLUS) liquid Take 30 mLs by mouth 2 (two) times daily between meals. 04/12/22   Alwyn Ren, MD  pantoprazole (PROTONIX) 40 MG tablet Take 1 tablet (40 mg total) by mouth 2 (two) times daily before a meal. 02/11/22   Esterwood, Amy S, PA-C  polyethylene glycol (MIRALAX / GLYCOLAX) 17 g packet Take 17 g by mouth 2 (two) times daily as needed for mild constipation. 01/25/22   Briant Cedar, MD  potassium chloride SA (KLOR-CON M) 20 MEQ  tablet Take 1 tablet (20 mEq total) by mouth 2 (two) times daily for 10 days. 07/12/22 07/23/22  Adron Bene, MD  pyridOXINE (B-6) 100 MG tablet Take 1 tablet (100 mg total) by mouth daily. 04/13/22   Alwyn Ren, MD  saccharomyces boulardii (FLORASTOR) 250 MG capsule Take 1 capsule (250 mg total) by mouth 2 (two) times daily. 04/12/22   Alwyn Ren, MD  selenium 100 MCG TABS Take 1 tablet (100 mcg total) by mouth daily. 04/13/22   Alwyn Ren, MD  silver sulfADIAZINE (SILVADENE) 1 % cream Apply topically daily. Patient taking differently: Apply 1 Application topically daily. 04/13/22   Alwyn Ren, MD  vitamin A 3 MG (10000 UNITS) capsule Take 1 capsule (10,000 Units total) by mouth every other day. 04/13/22   Alwyn Ren, MD  vitamin B-12 (CYANOCOBALAMIN) 100 MCG tablet Take 100 mcg by mouth daily.    [provider]  vitamin E 180 MG (400 UNITS) capsule Take 1 capsule (400 Units total) by mouth daily. 04/13/22   Alwyn Ren, MD  zinc sulfate 220 (50 Zn) MG capsule Take 1 capsule (220 mg total) by mouth daily. 01/09/22   Almon Hercules, MD  PROMETHAZINE HCL PO Take 4 mg by mouth.   06/06/11  [provider]  Ranitidine HCl (ZANTAC PO) Take by mouth.    06/06/11  [provider]      Allergies    Other, Peanut-containing drug products, and Pecan extract    Review of Systems   Review of Systems  Constitutional:  Negative for chills, diaphoresis, fatigue and fever.  HENT:  Negative for congestion, rhinorrhea and sneezing.   Eyes: Negative.   Respiratory:  Positive for chest tightness. Negative for cough and shortness of breath.   Cardiovascular:  Negative for chest pain and leg swelling.  Gastrointestinal:  Positive for nausea and vomiting. Negative for abdominal pain, blood in stool and diarrhea.  Genitourinary:  Negative for difficulty urinating, flank pain, frequency and hematuria.  Musculoskeletal:  Negative for  arthralgias and back pain.  Skin:  Negative for rash.  Neurological:  Negative for dizziness, speech difficulty, weakness, numbness and headaches.    Physical Exam Updated Vital Signs BP 106/68   Pulse 87   Temp 97.6 F (36.4 C) (Oral)   Resp 15   LMP 03/10/2009   SpO2 100%  Physical Exam Constitutional:      Appearance: She is well-developed.  HENT:     Head: Normocephalic and atraumatic.  Eyes:     Pupils: Pupils are equal, round, and reactive to light.  Cardiovascular:     Rate and Rhythm: Normal rate and regular rhythm.     Heart sounds: Normal heart sounds.  Pulmonary:     Effort:  Pulmonary effort is normal. No respiratory distress.     Breath sounds: Normal breath sounds. No wheezing or rales.  Chest:     Chest wall: No tenderness.  Abdominal:     General: Bowel sounds are normal.     Palpations: Abdomen is soft.     Tenderness: There is no abdominal tenderness. There is no guarding or rebound.  Musculoskeletal:        General: Normal range of motion.     Cervical back: Normal range of motion and neck supple.  Lymphadenopathy:     Cervical: No cervical adenopathy.  Skin:    General: Skin is warm and dry.     Findings: No rash.  Neurological:     Mental Status: She is alert and oriented to person, place, and time.     ED Results / Procedures / Treatments   Labs (all labs ordered are listed, but only abnormal results are displayed) Labs Reviewed  CBC - Abnormal; Notable for the following components:      Result Value   Hemoglobin 11.5 (*)    HCT 34.7 (*)    All other components within normal limits  COMPREHENSIVE METABOLIC PANEL - Abnormal; Notable for the following components:   Sodium 132 (*)    Chloride 94 (*)    BUN 42 (*)    Creatinine, Ser 1.27 (*)    Albumin 3.3 (*)    Alkaline Phosphatase 192 (*)    GFR, Estimated 47 (*)    All other components within normal limits  TSH - Abnormal; Notable for the following components:   TSH 0.036 (*)     All other components within normal limits  T4, FREE  TROPONIN I (HIGH SENSITIVITY)  TROPONIN I (HIGH SENSITIVITY)    EKG EKG Interpretation  Date/Time:  Wednesday Jul 14 2022 08:35:57 EDT Ventricular Rate:  98 PR Interval:  144 QRS Duration: 80 QT Interval:  328 QTC Calculation: 419 R Axis:   47 Text Interpretation: Sinus rhythm LAE, consider biatrial enlargement ST elevation, consider inferior injury Confirmed by Rolan Bucco [phone]) on 07/14/2022 8:38:35 AM  Radiology DG Chest 2 View  Result Date: 07/14/2022 CLINICAL DATA:  Onset sharp chest pain this morning. EXAM: CHEST - 2 VIEW COMPARISON:  Single-view of the chest 04/11/2022. CT chest, abdomen and pelvis 03/24/2022. FINDINGS: The lungs are clear. No pneumothorax or pleural effusion. Heart size is normal. No acute bony abnormality. Remote healed fracture posterior arc left sixth rib noted. IMPRESSION: No acute disease. Electronically Signed   By: Drusilla Kanner M.D.   On: 07/14/2022 09:56    Procedures Procedures    Medications Ordered in ED Medications  sodium chloride 0.9 % bolus 500 mL (0 mLs Intravenous Stopped 07/14/22 1444)  pantoprazole (PROTONIX) injection 40 mg (40 mg Intravenous Given 07/14/22 1231)    ED Course/ Medical Decision Making/ A&P                             Medical Decision Making Amount and/or Complexity of Data Reviewed Labs: ordered. Radiology: ordered.  Risk Prescription drug management.   Patient presented with some chest discomfort.  It lasted about 5 minutes and was dull on arrival however resolved while she was in the ED.  She had no other associated symptoms.  It was in the center of her chest.  She has not had any other exertional type symptoms.  Her EKG does not show any ischemic changes.  She had 2 negative troponins.  Her other labs showed a low TSH but her T4 was okay.  Counseled her to follow-up on this with her primary care doctor.  She had no further episodes of chest pain.  Her  chest x-ray was interpreted by me and confirmed by the radiologist to show no evidence of pulmonary edema.  No pneumothorax.  No pneumonia.  She was mildly tachycardic on arrival with a sinus tachycardia.  No arrhythmias were noted.  She does not have persistent tachycardia or other symptoms that would be more concerning for PE.  No unilateral leg swelling.  No pleuritic pain.  No hypoxia.  She was given some IV fluids and this improved.  Her creatinine has been mildly elevated and she was instructed to follow-up with her primary care doctor to have this rechecked.  Suspect she had a degree of dehydration which improved with IV fluids.  She is currently asymptomatic and I do not see an indication for inpatient hospitalization.  She was discharged home in good condition.  She was encouraged to have close follow-up with her PCP.  Return precautions were given.  Final Clinical Impression(s) / ED Diagnoses Final diagnoses:  Chest pain, unspecified type    Rx / DC Orders ED Discharge Orders     None         Rolan Bucco, MD 07/15/22 [phone]

## 2022-07-14 NOTE — Telephone Encounter (Signed)
FYI   Patient presented for physical therapy appointment this morning. Upon arrival, patient was complaining of some middle chest pain that felt like indigestion. She did vomit a little bile when here. She states that these are unusual symptoms for her. She has been out of her lasix and potassium x 2 days, but states that she now has them and took them this morning on an empty stomach.  She denies arm pain or shortness of breath.  Robin had monitored patient's blood pressure, O2 SAT, and heart rate. While speaking with patient, heart rate continued to climb, while she was at rest, up to 104bpm.  Patient and husband were advised she should go straight to ED to be seen.  Patient was walked to the car and was going straight to ED at [facility].

## 2022-07-14 NOTE — ED Notes (Signed)
IV attempt unsuccessful, pt veins too small and will not hold. Phlebotomy consulted for labs

## 2022-07-14 NOTE — ED Notes (Signed)
IV team at bedside 

## 2022-07-14 NOTE — ED Triage Notes (Signed)
Pt came in via POV d/t feeling a weird sharp pain in her chest as she was getting out of her car at  her therapy appointment this morning. She told her therapist & they checked her BP & noted she was a little tachy. Pt then began feeling nauseated so she came in for eval since she does have Hx of CHF. A/O4, & endorses her CP very mild.

## 2022-07-14 NOTE — Therapy (Signed)
OUTPATIENT PHYSICAL THERAPY LOWER EXTREMITY TREATMENT   Patient Name: Carolyn [name] MRN: [id number] DOB:May 17, 1955, 67 y.o., female Today's Date: 07/14/2022   END OF SESSION:  PT End of Session - 07/14/22 0805     Visit Number 16    Number of Visits 30    Date for PT Re-Evaluation 08/13/22    Authorization Type [employer] PPO    Authorization Time Period $20 COPAY Approved Authorization #[id number]  Tracking #[id number] 4/15/-5/24    Authorization - Visit Number 4    Authorization - Number of Visits 12    Progress Note Due on Visit 20    PT Start Time 0803    PT Stop Time 0823    PT Time Calculation (min) 20 min    Equipment Utilized During Treatment Gait belt    Activity Tolerance Patient tolerated treatment well    Behavior During Therapy WFL for tasks assessed/performed                     Past Medical History:  Diagnosis Date   Alcoholic cirrhosis (HCC)    Chronic pancreatitis (HCC)    Diabetes mellitus without complication (HCC)    Endometriosis    GERD (gastroesophageal reflux disease)    Intractable nausea and vomiting 06/13/2020   Liver disease    Malabsorption    MALABSORPTION SYNDROME   Osteoporosis 03/2011   t score -2.5   Pancreatitis    due to cyst and tumors due to calcifications   Seizure (HCC)    no seizure disorder, controlled w/ meds per patient   Vitamin D deficiency 2012   VIT D 15   Past Surgical History:  Procedure Laterality Date   APPENDECTOMY     BIOPSY  01/19/2022   Procedure: BIOPSY;  Surgeon: Shellia Cleverly, DO;  Location: [facility];  Service: Gastroenterology;;   CHOLECYSTECTOMY     ESOPHAGOGASTRODUODENOSCOPY (EGD) WITH PROPOFOL N/A 01/19/2022   Procedure: ESOPHAGOGASTRODUODENOSCOPY (EGD) WITH PROPOFOL;  Surgeon: Shellia Cleverly, DO;  Location: [facility];  Service: Gastroenterology;  Laterality: N/A;   FEEDING TUBE RELOCATION  2010   JEJUNOSTOMY FEEDING TUBE  1990   MULTIPLE GASTRIC SURGERIES      MYOMECTOMY     OPEN REDUCTION INTERNAL FIXATION (ORIF) DISTAL RADIAL FRACTURE Left 06/29/2018   Procedure: OPEN REDUCTION INTERNAL FIXATION (ORIF)LEFT  DISTAL RADIAL FRACTURE;  Surgeon: Betha Loa, MD;  Location: [facility];  Service: Orthopedics;  Laterality: Left;   ROUX-EN-Y GASTRIC BYPASS     TIBIA IM NAIL INSERTION Left 04/21/2022   Procedure: LEFT INTRAMEDULLARY (IM) NAIL TIBIAL;  Surgeon: Eldred Manges, MD;  Location: [facility];  Service: Orthopedics;  Laterality: Left;   TIBIA IM NAIL INSERTION Left 05/05/2022   Procedure: LEFT TIBIAL NAIL DISTAL SCREW PLACEMENT;  Surgeon: Eldred Manges, MD;  Location: [facility];  Service: Orthopedics;  Laterality: Left;   TUBAL LIGATION     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   Palliative care patient 06/02/2022   History of gastric ulcer 03/23/2022   Abnormal loss of weight 03/23/2022   Acute on chronic diastolic CHF (congestive heart failure) (HCC) 03/23/2022   Cellulitis of left lower extremity 03/17/2022   Anemia of chronic disease 03/17/2022   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   Abnormal CT  of the abdomen 01/19/2022   Chronic gastric ulcer with perforation (HCC) 01/19/2022   Alcoholic cirrhosis of liver without ascites (HCC) 01/18/2022   Occult blood in stools 01/18/2022   Portal hypertensive gastropathy (HCC) 01/18/2022   Hypoalbuminemia 01/11/2022   Physical deconditioning 01/09/2022   Pressure injury of skin 01/08/2022   Hypokalemia 01/07/2022   Severe protein-calorie malnutrition (HCC) 01/07/2022   Normocytic anemia 01/07/2022   Osteomyelitis (HCC) 01/06/2022   Seizure (HCC) 05/08/2017   Chronic pain syndrome 05/08/2017   Gastroesophageal reflux disease without esophagitis 05/08/2017   Chronic alcoholic  pancreatitis (HCC) 05/08/2017   Osteoporosis 03/16/2011   Malabsorption     PCP: General PCP   REFERRING PROVIDER:  Eldred Manges, MD   REFERRING DIAG:  S82.252A (ICD-10-CM) - Displaced comminuted fracture of shaft of left tibia, initial encounter for closed fracture  S82.832A (ICD-10-CM) - Closed fracture of proximal end of left fibula, initial encounter    THERAPY DIAG:  Muscle weakness (generalized)  Acute pain of left knee  Pain in left ankle and joints of left foot  Stiffness of left ankle, not elsewhere classified  Other abnormalities of gait and mobility  Unsteadiness on feet  Rationale for Evaluation and Treatment: Rehabilitation  ONSET DATE: 04/21/2022, 1st ankle sg, 05/05/2022 2nd surgery  SUBJECTIVE:   SUBJECTIVE STATEMENT: she started having indigestion when got out of car.  She walked in yard with cane with no instability of ankle noted.   PERTINENT HISTORY: DM, Alcoholism in remission, chronic pancreatitis, hepatic cirrhosis, She had prolonged hospitalization 03/13/2022-1/29/204 with severe sepsis included hypoxic respiratory failure.   PAIN: NPRS scale: today left knee   2/10 & ankle 1/10 and since last PT lowest knee 1/10, ankle 1/10  & highest knee 2/10 ankle 2/10 Pain location: left knee more patella & ankle more medially Pain description: ache,  Aggravating factors:  meds wearing off, over night with increased in morning Relieving factors: meds, elevating & not moving knee / ankle  PRECAUTIONS: Fall  WEIGHT BEARING RESTRICTIONS: Yes LLE WBAT as of 05/18/2022  FALLS:  Has patient fallen in last 6 months? Yes. Number of falls 1  LIVING ENVIRONMENT: Lives with: lives with their spouse and 3 dogs 72, 78 & 50# Lives in: House single story  Stairs: Yes: External: 6 (long width)  steps; on right going up temporary ramp primary entrance Has following equipment at home: Dan Humphreys - 2 wheeled, Wheelchair (manual), Shower bench, bed side commode, Grab bars, and  Ramped entry  OCCUPATION: retired   PLOF: Independent  PATIENT GOALS:   get back to function in house & access community  Next MD visit:  05/18/2022  OBJECTIVE:  DIAGNOSTIC FINDINGS: 05/11/2022: Post distal tibial screw placement.  AP knee AP ankle appear to be in good rotational alignment.   PATIENT SURVEYS:  06/30/2022: FOTO 49% 05/17/2022 / Evaluation:  FOTO intake:  34%  predicted:  47%  COGNITION: 05/17/2022 / Evaluation:   Overall cognitive status: WFL    SENSATION: 05/17/2022 / Evaluation:   WFL for knee area but unable to test ankle as wrapped for wound  EDEMA:  05/17/2022 / Evaluation:  left foot & ankle appear edematous but wrapped due to wound.  POSTURE:  05/17/2022 / Evaluation:  rounded shoulders, forward head, increased thoracic kyphosis, flexed trunk , and weight shift right  PALPATION: 05/17/2022 / Evaluation: not tested due to wrapped for wound  LOWER EXTREMITY ROM:   ROM Left Eval 05/17/22 Left 05/26/22 Left 06/16/22  Hip flexion  Hip extension     Hip abduction     Hip adduction     Hip internal rotation     Hip external rotation     Knee flexion     Knee extension Seated A: LAQ -22* Seated  A: LAQ -11* Seated  A: LAQ -9*  Ankle dorsiflexion  Seated knee flexed A: -3* P: +2*  Seated knee flexed A: +3* P: +6*  Ankle plantarflexion  P & A 19* P & A 21*  Ankle inversion     Ankle eversion     Ankle Not tested at evaluation due to wound with bandages.  (Blank rows = not tested)  LOWER EXTREMITY MMT:  MMT Right eval Left eval  Hip flexion    Hip extension    Hip abduction    Hip adduction    Hip internal rotation    Hip external rotation    Knee flexion  3-/5  Knee extension  3-/5  Ankle dorsiflexion    Ankle plantarflexion    Ankle inversion    Ankle eversion    Ankle Not tested at evaluation due to Toe touch Weightbearing status.  (Blank rows = not tested)   FUNCTIONAL TESTS:  06/30/2022: Timed Up & Go: with cane 12.90 sec & without AD  11.53 sec  05/17/2022 / Evaluation: 18 inch chair transfer:  requires armrests or RW to push up to arise.  Requires RW support for stabilization  GAIT: 07/07/2022: pt amb 200' without device with supervision. She has LLE toe-in / internal rotation of hip but can correct with verbal cues.   06/28/2022: Pt amb 200' with cane stand alone tip with supervision on flat indoor surfaces. Gait velocity 2.53 ft/sec   05/17/2022 / Evaluation: Distance walked: 30' Assistive device utilized: Environmental consultant - 2 wheeled Level of assistance: SBA Comments: NWB LLE.  Turns with pivoting on foot incorrectly.    TODAY'S TREATMENT                                                                          DATE: 07/14/2022: BP 127/81 HR 90 SpO2 99% She went to bathroom and threw up yellow bile.   She took potassium & lasix which she has not taken in 2 days.  PT continued to monitor with pulse oximeter. With seated rest her HR continued to increase up to 104 bpm.  CMA for Dr. Ophelia Charter came to PT session.  Patient reports she felt better after throwing up but chest still did not feel right. Denies heaviness or pain above waist.  PT & CMA recommended immediately going to ED to be checked out.  PT escorted her to car & husband/boyfriend verbalized would take her 3 blocks to ED at [facility] per our recommendations.   07/12/2022: Therapeutic Exercise: Precor recumbent bike seat 6 with BLEs level 4 for 8 min Step up, over & step down with 2 step lead BUE support //bars on BOSU round side up BLEs 10 reps. PT demo & verbal cues on technique Standing red theraband kicks flexion, abduction, extension, adduction 10 reps BLEs without UE support with minA/CGA for balance. BAPs level 1 standing circles CW & CCW with minA for eversion initially. Standing on foam pad PF  reaching overhead 15 reps 1st set, then moving UEs right left during PF for inversion / eversion 15 reps Gastroc stretch on step heel depression 30 sec hold 2  reps Hamstring stretch long sit strap DF trunk flexion 30 sec hold 2 reps Seated LLE hip external rotation red theraband 15 reps.  Neuromuscular Re-education: Braiding & backwards gait with supervision. Outside //bars today.   Gait Training: Pt arrived / exited PT amb with cane with general supervision.  Pt amb 400' without device with SBA. Cues on not toe-in LLE. Worked on scanning & eyes closed   07/07/2022: Therapeutic Exercise: Precor recumbent bike seat 6 with BLEs level 4 for 8 min Step up, over & step down with 2 step lead BUE support //bars on BOSU round side up BLEs 10 reps. PT demo & verbal cues on technique & rationale.  Standing red theraband kicks flexion, abduction, extension, adduction 10 reps BLEs without UE support with minA for balance. Seated LLE hip external rotation red theraband 15 reps.  Neuromuscular Re-education: Tandem on foam beam 3 laps with intermittent touch Side stepping on foam beam 3 laps with intermittent touch Standing on foam pad tapping 4 cones lateral to across midline BLEs single UE support 10 reps; then tapping single cone in front of foot BLEs no UE support 10 reps ea LE   Gait Training: Pt arrived / exited PT amb with cane with general supervision.  Pt amb 200' without device with SBA. Cues on not toe-in LLE. No LOB noted. Pt neg ramp without device 3 reps with SBA. No LOB noted.  Pt neg curb without device alternating lead LE 3 reps ea with SBA. No LOB noted PT recommended walking on grass at home with cane & [name] supervision.  Verbal cues to pay attention to LLE not toe-in. Both verbalized understanding.    HOME EXERCISE PROGRAM: Access Code: [email or web address] URL: [email or web address] Date: 06/09/2022 Prepared by: Vladimir Faster  Exercises - seated marching  - 2 x daily - 7 x weekly - 3 sets - 10 reps - 5 seconds hold - Seated Hip Abduction  - 2 x daily - 7 x weekly - 3 sets - 10 reps - 5 seconds hold - Seated Hamstring  Stretch  - 2 x daily - 7 x weekly - 1 sets - 3 reps - 20-30 seconds hold - Seated Ankle Pumps  - 2 x daily - 7 x weekly - 3 sets - 10 reps - 5 seconds hold - Seated Ankle Circles  - 2 x daily - 7 x weekly - 3 sets - 10 reps - Seated Leg Press with Resistance  - 1 x daily - 7 x weekly - 2 sets - 10 reps - 5 seconds hold - Seated Ankle Plantarflexion with Resistance  - 1 x daily - 7 x weekly - 2 sets - 10 reps - 5 seconds hold - Ankle Dorsiflexion with Resistance  - 1 x daily - 7 x weekly - 2 sets - 10 reps - 5 seconds hold - Seated Figure 4 Ankle Inversion with Resistance  - 1 x daily - 7 x weekly - 2 sets - 10 reps - 5 seconds hold - Seated Ankle Eversion with Anchored Resistance  - 1 x daily - 7 x weekly - 1 sets - 10 reps - 5 seconds hold - Standing Gastroc Stretch on Step  - 1 x daily - 7 x weekly - 1 sets - 2-3 reps - 30 seconds hold - Standing Soleus Stretch  on Step  - 1 x daily - 7 x weekly - 1 sets - 2-3 reps - 30 seconds hold  ASSESSMENT: CLINICAL IMPRESSION: Patient had cardiac symptoms that PT recommended immediate ED visit to rule out issues.  Pt & husband in agreement. Pt did not want an ambulance called & did not appear critical enough to call EMS.    OBJECTIVE IMPAIRMENTS: Abnormal gait, cardiopulmonary status limiting activity, decreased activity tolerance, decreased balance, decreased coordination, decreased endurance, decreased knowledge of condition, decreased knowledge of use of DME, decreased mobility, difficulty walking, decreased ROM, decreased strength, increased edema, increased muscle spasms, impaired flexibility, impaired sensation, postural dysfunction, and pain.   ACTIVITY LIMITATIONS: carrying, lifting, bending, standing, squatting, sleeping, stairs, transfers, reach over head, and locomotion level  PARTICIPATION LIMITATIONS: meal prep, cleaning, driving, and community activity  PERSONAL FACTORS: Fitness, Time since onset of injury/illness/exacerbation, and 3+  comorbidities: see PMH  are also affecting patient's functional outcome.   REHAB POTENTIAL: Good  CLINICAL DECISION MAKING: Stable/uncomplicated  EVALUATION COMPLEXITY: Low   GOALS: Goals reviewed with patient? Yes  SHORT TERM GOALS: (target date for Short term goals are 30 days 07/16/2022)   1.  Patient will demonstrate independent use of updated home exercise program to maintain progress from in clinic treatments.  Goal status: MET 07/12/2022  2. Patient amb 200' & neg ramps/curbs without device with supervision.  Goal Status: MET 07/12/2022  LONG TERM GOALS: (target dates for all long term goals are 90 days 08/13/2022)   1. Patient will demonstrate/report pain at worst less than or equal to 2/10 to facilitate minimal limitation in daily activity secondary to pain symptoms. Goal status: Ongoing 07/07/2022   2. Patient will demonstrate independent use of home exercise program to facilitate ability to maintain/progress functional gains from skilled physical therapy services. Goal status: Ongoing 07/07/2022   3. Patient will demonstrate FOTO outcome > or = 47 % to indicate reduced disability due to condition.  Goal status: MET 06/30/2022   4.  Patient will demonstrate LLE MMT 5/5 throughout to faciltiate usual transfers, stairs, squatting at [facility] for daily life.   Goal status: Ongoing 07/07/2022   5.  Patient ambulates >500' with LRAD and neg ramp, curb & stairs single rail modified independent.  Goal status: New   6.  AROM of Left knee [facility] & left ankle within 75% of right ankle Goal status: Ongoing 07/07/2022   PLAN:  PT FREQUENCY: 2-3 x/week  PT DURATION: 13 weeks (90 days)  PLANNED INTERVENTIONS: Therapeutic exercises, Therapeutic activity, Neuro Muscular re-education, Balance training, Gait training, Patient/Family education, Joint mobilization, Stair training, DME instructions, Dry Needling, Electrical stimulation, Traction, Cryotherapy, vasopneumatic deviceMoist heat,  Taping, Ultrasound, Ionotophoresis 4mg /ml Dexamethasone, and Manual therapy.  All included unless contraindicated  PLAN FOR NEXT SESSION:  CHECK ED note.  work towards [employer], update HEP.  work on gait & balance without device, Cont progressing there exer & therapeutic activities to tolerance including balance activities   Vladimir Faster, PT, DPT 07/14/2022, 8:27 AM

## 2022-07-16 ENCOUNTER — Encounter (HOSPITAL_BASED_OUTPATIENT_CLINIC_OR_DEPARTMENT_OTHER): Payer: Medicare PPO | Attending: General Surgery | Admitting: General Surgery

## 2022-07-16 DIAGNOSIS — E43 Unspecified severe protein-calorie malnutrition: Secondary | ICD-10-CM | POA: Diagnosis not present

## 2022-07-16 DIAGNOSIS — K860 Alcohol-induced chronic pancreatitis: Secondary | ICD-10-CM | POA: Diagnosis not present

## 2022-07-16 DIAGNOSIS — L89512 Pressure ulcer of right ankle, stage 2: Secondary | ICD-10-CM | POA: Diagnosis present

## 2022-07-16 DIAGNOSIS — Z681 Body mass index (BMI) 19 or less, adult: Secondary | ICD-10-CM | POA: Insufficient documentation

## 2022-07-16 DIAGNOSIS — G894 Chronic pain syndrome: Secondary | ICD-10-CM | POA: Insufficient documentation

## 2022-07-16 DIAGNOSIS — L89521 Pressure ulcer of left ankle, stage 1: Secondary | ICD-10-CM | POA: Insufficient documentation

## 2022-07-16 DIAGNOSIS — K7030 Alcoholic cirrhosis of liver without ascites: Secondary | ICD-10-CM | POA: Diagnosis not present

## 2022-07-16 NOTE — Progress Notes (Signed)
AWV reviewed and approved. Charissa Bash, MD

## 2022-07-17 NOTE — Progress Notes (Signed)
JAPLEEN, CEJA ([id number]) [id number] Page 1 of 9 Visit Report for 07/16/2022 Allergy List Details Patient Name: Date of Service: Carolyn [name] [place] C. 07/16/2022 7:30 A M Medical Record Number: [id number] Patient Account Number: [id number] Date of Birth/Sex: Treating RN: 11/30/1955 (67 y.o. Carolyn [name] Primary Care Carolyn [name]: Carolyn [name] Other Clinician: Referring Carolyn [name]: Treating Carolyn [name] in Treatment: 0 Allergies Active Allergies Peanut (Legumes) Reaction: anaphylaxis Allergy Notes Electronic Signature(s) Signed: 07/16/2022 1:26:16 PM By: Karie Schwalbe RN Entered By: Karie Schwalbe on 07/16/2022 08:10:08 -------------------------------------------------------------------------------- Arrival Information Details Patient Name: Date of Service: Carolyn [name] [place] C. 07/16/2022 7:30 A M Medical Record Number: [id number] Patient Account Number: [id number] Date of Birth/Sex: Treating RN: 06-19-1955 (67 y.o. Carolyn [name] Primary Care Carolyn [name]: Carolyn [name] Other Clinician: Referring Madia Carvell: Treating Carolyn [name]/Extender: Carolyn [name] in Treatment: 0 Visit Information Patient Arrived: Carolyn [name] Time: 07:55 Accompanied By: self Transfer Assistance: None Patient Identification Verified: Yes Patient Has Alerts: Yes Patient Alerts: ABI R 1.23 (PTA) 1.13(DP) ABI L 1.21( PTAand DP) Electronic Signature(s) Signed: 07/16/2022 1:26:16 PM By: Karie Schwalbe RN Entered By: Karie Schwalbe on 07/16/2022 08:06:59 -------------------------------------------------------------------------------- Clinic Level of Care Assessment Details Patient Name: Date of Service: Carolyn [name] [name] 07/16/2022 7:30 A M Medical Record Number: [id number] Patient Account Number: [id number] Date of Birth/Sex: Treating RN: December 18, 1955 (67 y.o. Carolyn [name] Primary Care Annalucia Laino: Carolyn [name] Other Clinician: Referring Charmaine Placido: Treating Amilyah Nack/Extender: Carolyn [name] in Treatment: 0 Clinic Level of Care Assessment Items TOOL 2 Quantity Score X- 1 0 Use when only an EandM is performed on the INITIAL visit ASSESSMENTS - Nursing Assessment / Reassessment X- 1 20 General Physical Exam (combine w/ comprehensive assessment (listed just below) when performed on new pt. evals) X- 1 25 Comprehensive Assessment (HX, ROS, Risk Assessments, Wounds Hx, etc.) ILINE, MOUSER ([id number]) [phone] Page 2 of 9 ASSESSMENTS - Wound and Skin A ssessment / Reassessment []  - 0 Simple Wound Assessment / Reassessment - one wound X- 2 5 Complex Wound Assessment / Reassessment - multiple wounds []  - 0 Dermatologic / Skin Assessment (not related to wound area) ASSESSMENTS - Ostomy and/or Continence Assessment and Care []  - 0 Incontinence Assessment and Management []  - 0 Ostomy Care Assessment and Management (repouching, etc.) PROCESS - Coordination of Care []  - 0 Simple Patient / Family Education for ongoing care X- 1 20 Complex (extensive) Patient / Family Education for ongoing care X- 1 10 Staff obtains Chiropractor, Records, T Results / Process Orders est X- 1 10 Staff telephones HHA, Nursing Homes / Clarify orders / etc []  - 0 Routine Transfer to another Facility (non-emergent condition) []  - 0 Routine Hospital Admission (non-emergent condition) []  - 0 New Admissions / Manufacturing engineer / Ordering NPWT Apligraf, etc. , []  - 0 Emergency Hospital Admission (emergent condition) X- 1 10 Simple Discharge Coordination []  - 0 Complex (extensive) Discharge Coordination PROCESS - Special Needs []  - 0 Pediatric / Minor Patient Management []  - 0 Isolation Patient Management []  - 0 Hearing / Language / Visual special needs []  - 0 Assessment of Community assistance (transportation, D/C planning, etc.) []  -  0 Additional assistance / Altered mentation []  - 0 Support Surface(s) Assessment (bed, cushion, seat, etc.) INTERVENTIONS - Wound Cleansing / Measurement X- 1 5 Wound Imaging (photographs - any number of wounds) []  - 0 Wound Tracing (instead of photographs) []  - 0 Simple Wound Measurement - one wound X- 2  5 Complex Wound Measurement - multiple wounds []  - 0 Simple Wound Cleansing - one wound X- 2 5 Complex Wound Cleansing - multiple wounds INTERVENTIONS - Wound Dressings []  - 0 Small Wound Dressing one or multiple wounds X- 2 15 Medium Wound Dressing one or multiple wounds []  - 0 Large Wound Dressing one or multiple wounds []  - 0 Application of Medications - injection INTERVENTIONS - Miscellaneous []  - 0 External ear exam []  - 0 Specimen Collection (cultures, biopsies, blood, body fluids, etc.) []  - 0 Specimen(s) / Culture(s) sent or taken to Lab for analysis []  - 0 Patient Transfer (multiple staff / Michiel Sites Lift / Similar devices) []  - 0 Simple Staple / Suture removal (25 or less) []  - 0 Complex Staple / Suture removal (26 or more) PRINCE, NAYYAR C ([id number]) [id number] Page 3 of 9 []  - 0 Hypo / Hyperglycemic Management (close monitor of Blood Glucose) []  - 0 Ankle / Brachial Index (ABI) - do not check if billed separately Has the patient been seen at the hospital within the last three years: Yes Total Score: 160 Level Of Care: New/Established - Level 5 Electronic Signature(s) Signed: 07/16/2022 1:26:16 PM By: Karie Schwalbe RN Entered By: Karie Schwalbe on 07/16/2022 13:16:44 -------------------------------------------------------------------------------- Encounter Discharge Information Details Patient Name: Date of Service: Carolyn [name] [place] C. 07/16/2022 7:30 A M Medical Record Number: [id number] Patient Account Number: [id number] Date of Birth/Sex: Treating RN: 04-Oct-1955 (67 y.o. Carolyn [name] Primary Care Samer Dutton: Carolyn [name] Other Clinician: Referring Gaylin Bulthuis: Treating Alanys Godino/Extender: Carolyn [name] in Treatment: 0 Encounter Discharge Information Items Discharge Condition: Stable Ambulatory Status: Cane Discharge Destination: Home Transportation: Private Auto Accompanied By: self Schedule Follow-up Appointment: Yes Clinical Summary of Care: Patient Declined Electronic Signature(s) Signed: 07/16/2022 1:26:16 PM By: Karie Schwalbe RN Entered By: Karie Schwalbe on 07/16/2022 13:17:27 -------------------------------------------------------------------------------- Lower Extremity Assessment Details Patient Name: Date of Service: Carolyn [name] [place] C. 07/16/2022 7:30 A M Medical Record Number: [id number] Patient Account Number: [id number] Date of Birth/Sex: Treating RN: [birth year] (67 y.o. Carolyn [name] Primary Care Deshondra Worst: Carolyn [name] Other Clinician: Referring Banner Huckaba: Treating Evangelene Vora/Extender: Carolyn [name] in Treatment: 0 Edema Assessment Assessed: [Left: No] [Right: No] [Left: Edema] [Right: :] Calf Left: Right: Point of Measurement: From Medial Instep 25 cm 24.5 cm Ankle Left: Right: Point of Measurement: From Medial Instep 19 cm 18.5 cm Vascular Assessment Pulses: Dorsalis Pedis Palpable: [Left:Yes] [Right:Yes] Electronic Signature(s) Signed: 07/16/2022 1:26:16 PM By: Karie Schwalbe RN Entered By: Karie Schwalbe on 07/16/2022 08:26:50 Loos, Woodward Ku ([id number]) [id number] Page 4 of 9 -------------------------------------------------------------------------------- Multi Wound Chart Details Patient Name: Date of Service: Carolyn [name] [place] C. 07/16/2022 7:30 A M Medical Record Number: [id number] Patient Account Number: [id number] Date of Birth/Sex: Treating RN: 1955/08/24 (67 y.o. F) Primary Care Ryian Lynde: Carolyn [name] Other Clinician: Referring Calvin Jablonowski: Treating Adjoa Althouse/Extender:  Carolyn [name] in Treatment: 0 Vital Signs Height(in): 68 Pulse(bpm): 100 Weight(lbs): 105 Blood Pressure(mmHg): 121/61 Body Mass Index(BMI): 16 Temperature(F): 98.6 Respiratory Rate(breaths/min): 16 [1:Photos:] [N/A:N/A] Left, Medial Ankle Right, Medial Ankle N/A Wound Location: Gradually Appeared Gradually Appeared N/A Wounding Event: Abrasion Abrasion N/A Primary Etiology: Cirrhosis , Seizure Disorder Cirrhosis , Seizure Disorder N/A Comorbid History: 04/13/2022 04/13/2022 N/A Date Acquired: 0 0 N/A Weeks of Treatment: Open Open N/A Wound Status: No No N/A Wound Recurrence: 0.2x0.2x0.1 0.4x0.2x0.1 N/A Measurements L x W x D (cm) 0.031 0.063 N/A A (cm) : rea 0.003 0.006 N/A Volume (cm) : Full Thickness  Without Exposed Full Thickness Without Exposed N/A Classification: Support Structures Support Structures Medium Medium N/A Exudate Amount: Serosanguineous Serosanguineous N/A Exudate Type: red, brown red, brown N/A Exudate Color: Large (67-100%) Large (67-100%) N/A Granulation Amount: Red Red, Hyper-granulation N/A Granulation Quality: Small (1-33%) Small (1-33%) N/A Necrotic Amount: Fat Layer (Subcutaneous Tissue): Yes Fat Layer (Subcutaneous Tissue): Yes N/A Exposed Structures: Fascia: No Fascia: No Tendon: No Tendon: No Muscle: No Muscle: No Joint: No Joint: No Bone: No Bone: No Small (1-33%) Small (1-33%) N/A Epithelialization: No Abnormalities Noted No Abnormalities Noted N/A Periwound Skin Texture: No Abnormalities Noted No Abnormalities Noted N/A Periwound Skin Moisture: No Abnormalities Noted No Abnormalities Noted N/A Periwound Skin Color: No Abnormality No Abnormality N/A Temperature: Treatment Notes Electronic Signature(s) Signed: 07/16/2022 9:13:15 AM By: Duanne Guess MD FACS Entered By: Duanne Guess on 07/16/2022  09:13:15 -------------------------------------------------------------------------------- Multi-Disciplinary Care Plan Details Patient Name: Date of Service: Carolyn [name] [place] C. 07/16/2022 7:30 A M Medical Record Number: [id number] Patient Account Number: [id number] Date of Birth/Sex: Treating RN: 23-Mar-1955 (67 y.o. Carolyn [name] Primary Care Rai Sinagra: Carolyn [name] Other Clinician: Referring Indianna Boran: Treating Meela Wareing/Extender: Annajulia, Fretwell ([id number]) [id number] Page 5 of 9 Weeks in Treatment: 0 Active Inactive Wound/Skin Impairment Nursing Diagnoses: Impaired tissue integrity Goals: Patient/caregiver will verbalize understanding of skin care regimen Date Initiated: 07/16/2022 Target Resolution Date: 10/13/2022 Goal Status: Active Interventions: Assess ulceration(s) every visit Treatment Activities: Skin care regimen initiated : 07/16/2022 Notes: Electronic Signature(s) Signed: 07/16/2022 1:26:16 PM By: Karie Schwalbe RN Entered By: Karie Schwalbe on 07/16/2022 13:15:29 -------------------------------------------------------------------------------- Pain Assessment Details Patient Name: Date of Service: Carolyn [name] [place] C. 07/16/2022 7:30 A M Medical Record Number: [id number] Patient Account Number: [id number] Date of Birth/Sex: Treating RN: Feb 26, 1956 (67 y.o. Carolyn [name] Primary Care Rawson Minix: Carolyn [name] Other Clinician: Referring Traniyah Hallett: Treating Ashtian Villacis/Extender: Carolyn [name] in Treatment: 0 Active Problems Location of Pain Severity and Description of Pain Patient Has Paino No Site Locations Pain Management and Medication Current Pain Management: Electronic Signature(s) Signed: 07/16/2022 1:26:16 PM By: Karie Schwalbe RN Entered By: Karie Schwalbe on 07/16/2022 08:36:47 Puzio, Woodward Ku ([id number]) [id number] Page 6 of  9 -------------------------------------------------------------------------------- Patient/Caregiver Education Details Patient Name: Date of Service: SA Carolyn [name] [name] 5/3/2024andnbsp7:30 A M Medical Record Number: [id number] Patient Account Number: [id number] Date of Birth/Gender: Treating RN: [birth year] (67 y.o. Carolyn [name] Primary Care Physician: Carolyn [name] Other Clinician: Referring Physician: Treating Physician/Extender: Carolyn [name] in Treatment: 0 Education Assessment Education Provided To: Patient Education Topics Provided Wound/Skin Impairment: Methods: Explain/Verbal Responses: Return demonstration correctly Electronic Signature(s) Signed: 07/16/2022 1:26:16 PM By: Karie Schwalbe RN Entered By: Karie Schwalbe on 07/16/2022 13:15:38 -------------------------------------------------------------------------------- Wound Assessment Details Patient Name: Date of Service: Carolyn [name] C. 07/16/2022 7:30 A M Medical Record Number: [id number] Patient Account Number: [id number] Date of Birth/Sex: Treating RN: 1955/10/16 (67 y.o. Carolyn [name] Primary Care Delaina Fetsch: Carolyn [name] Other Clinician: Referring Pa Tennant: Treating Tanessa Tidd/Extender: Carolyn [name] in Treatment: 0 Wound Status Wound Number: 1 Primary Etiology: Abrasion Wound Location: Left, Medial Ankle Wound Status: Open Wounding Event: Gradually Appeared Comorbid History: Cirrhosis , Seizure Disorder Date Acquired: 04/13/2022 Weeks Of Treatment: 0 Clustered Wound: No Photos Wound Measurements Length: (cm) 0.2 Width: (cm) 0.2 Depth: (cm) 0.1 Area: (cm) 0.031 Volume: (cm) 0.003 % Reduction in Area: % Reduction in Volume: Epithelialization: Small (1-33%) Tunneling: No Undermining: No Wound Description Classification: Full Thickness Without Exposed Support Structures Exudate  Amount: Medium Exudate Type:  Serosanguineous Exudate Color: red, brown HEATHERMARIE, SCHISLER C ([id number]) Wound Bed Granulation Amount: Large (67-100%) Granulation Quality: Red Necrotic Amount: Small (1-33%) Necrotic Quality: Adherent Slough Foul Odor After Cleansing: No Slough/Fibrino Yes [id number] Page 7 of 9 Exposed Structure Fascia Exposed: No Fat Layer (Subcutaneous Tissue) Exposed: Yes Tendon Exposed: No Muscle Exposed: No Joint Exposed: No Bone Exposed: No Periwound Skin Texture Texture Color No Abnormalities Noted: Yes No Abnormalities Noted: Yes Moisture Temperature / Pain No Abnormalities Noted: Yes Temperature: No Abnormality Treatment Notes Wound #1 (Ankle) Wound Laterality: Left, Medial Cleanser Peri-Wound Care Topical Mupirocin Ointment Discharge Instruction: Apply Mupirocin (Bactroban) in clinic Bacitracin Discharge Instruction: Use this at home Primary Dressing Secondary Dressing Bordered Gauze, 2x2 in Discharge Instruction: Apply over primary dressing as directed. DONUT Discharge Instruction: Use donut at home Secured With Compression Wrap Compression Stockings Add-Ons Electronic Signature(s) Signed: 07/16/2022 1:26:16 PM By: Karie Schwalbe RN Entered By: Karie Schwalbe on 07/16/2022 08:35:44 -------------------------------------------------------------------------------- Wound Assessment Details Patient Name: Date of Service: Carolyn [name] [place] C. 07/16/2022 7:30 A M Medical Record Number: [id number] Patient Account Number: [id number] Date of Birth/Sex: Treating RN: [birth year] (67 y.o. Carolyn [name] Primary Care Lockie Bothun: Carolyn [name] Other Clinician: Referring Adelin Ventrella: Treating Nyjah Schwake/Extender: Carolyn [name] in Treatment: 0 Wound Status Wound Number: 2 Primary Etiology: Abrasion Wound Location: Right, Medial Ankle Wound Status: Open Wounding Event: Gradually Appeared Comorbid History: Cirrhosis , Seizure  Disorder Date Acquired: 04/13/2022 Weeks Of Treatment: 0 Clustered Wound: No Photos BERNEDETTE, INSINGA ([id number]) [phone] Page 8 of 9 Wound Measurements Length: (cm) 0.4 Width: (cm) 0.2 Depth: (cm) 0.1 Area: (cm) 0.063 Volume: (cm) 0.006 % Reduction in Area: % Reduction in Volume: Epithelialization: Small (1-33%) Tunneling: No Undermining: No Wound Description Classification: Full Thickness Without Exposed Suppor Exudate Amount: Medium Exudate Type: Serosanguineous Exudate Color: red, brown t Structures Foul Odor After Cleansing: No Slough/Fibrino Yes Wound Bed Granulation Amount: Large (67-100%) Exposed Structure Granulation Quality: Red, Hyper-granulation Fascia Exposed: No Necrotic Amount: Small (1-33%) Fat Layer (Subcutaneous Tissue) Exposed: Yes Necrotic Quality: Adherent Slough Tendon Exposed: No Muscle Exposed: No Joint Exposed: No Bone Exposed: No Periwound Skin Texture Texture Color No Abnormalities Noted: Yes No Abnormalities Noted: Yes Moisture Temperature / Pain No Abnormalities Noted: Yes Temperature: No Abnormality Treatment Notes Wound #2 (Ankle) Wound Laterality: Right, Medial Cleanser Peri-Wound Care Topical Mupirocin Ointment Discharge Instruction: Apply Mupirocin (Bactroban) in clinic Bacitracin Discharge Instruction: Use this at home Primary Dressing Secondary Dressing Bordered Gauze, 2x2 in Discharge Instruction: Apply over primary dressing as directed. DONUT Discharge Instruction: Use donut at home Secured With Compression Wrap Compression Stockings Add-Ons Electronic Signature(s) Signed: 07/16/2022 1:26:16 PM By: Karie Schwalbe RN Entered By: Karie Schwalbe on 07/16/2022 08:36:22 SORINA, LOWEY ([id number]) [id number] Page 9 of 9 -------------------------------------------------------------------------------- Vitals Details Patient Name: Date of Service: Carolyn [name] [place]  C. 07/16/2022 7:30 A M Medical Record Number: [id number] Patient Account Number: [id number] Date of Birth/Sex: Treating RN: 1955-04-01 (67 y.o. Carolyn [name] Primary Care Tannie Koskela: Carolyn [name] Other Clinician: Referring Ting Cage: Treating Leib Elahi/Extender: Carolyn [name] in Treatment: 0 Vital Signs Time Taken: 08:07 Temperature (F): 98.6 Height (in): 68 Pulse (bpm): 100 Source: Stated Respiratory Rate (breaths/min): 16 Weight (lbs): 105 Blood Pressure (mmHg): 121/61 Source: Stated Reference Range: 80 - 120 mg / dl Body Mass Index (BMI): 16 Electronic Signature(s) Signed: 07/16/2022 1:26:16 PM By: Karie Schwalbe RN Entered By: Karie Schwalbe on 07/16/2022 08:09:26

## 2022-07-17 NOTE — Progress Notes (Signed)
Carolyn [name] ([id number]) [id number] Page 1 of 4 Visit Report for 07/16/2022 Abuse Risk Screen Details Patient Name: Date of Service: Carolyn [name] Carolyn [name]. 07/16/2022 7:30 A M Medical Record Number: [id number] Patient Account Number: [id number] Date of Birth/Sex: Treating RN: 01/31/1956 (67 y.o. Katrinka Blazing Primary Care Amarie Viles: Adron Bene Other Clinician: Referring Neven Fina: Treating Cordarious Zeek/Extender: Dot Lanes in Treatment: 0 Abuse Risk Screen Items Answer ABUSE RISK SCREEN: Has anyone close to you tried to hurt or harm you recentlyo No Do you feel uncomfortable with anyone in your familyo No Has anyone forced you do things that you didnt want to doo No Electronic Signature(s) Signed: 07/16/2022 1:26:16 PM By: Karie Schwalbe RN Entered By: Karie Schwalbe on 07/16/2022 08:16:13 -------------------------------------------------------------------------------- Activities of Daily Living Details Patient Name: Date of Service: Carolyn [name] Carolyn [name] 07/16/2022 7:30 A M Medical Record Number: [id number] Patient Account Number: [id number] Date of Birth/Sex: Treating RN: 20-Feb-1956 (67 y.o. Katrinka Blazing Primary Care Jalecia Leon: Adron Bene Other Clinician: Referring Grisela Mesch: Treating Kerney Hopfensperger/Extender: Dot Lanes in Treatment: 0 Activities of Daily Living Items Answer Activities of Daily Living (Please select one for each item) Drive Automobile Completely Able T Medications ake Completely Able Use T elephone Completely Able Care for Appearance Completely Able Use T oilet Completely Able Bath / Shower Completely Able Dress Self Completely Able Feed Self Completely Able Walk Completely Able Get In / Out Bed Completely Able Housework Completely Able Prepare Meals Completely Able Handle Money Completely Able Shop for Self Completely Able Electronic Signature(s) Signed:  07/16/2022 1:26:16 PM By: Karie Schwalbe RN Entered By: Karie Schwalbe on 07/16/2022 08:16:45 -------------------------------------------------------------------------------- Education Screening Details Patient Name: Date of Service: Carolyn [name] Carolyn [name]. 07/16/2022 7:30 A M Medical Record Number: [id number] Patient Account Number: [id number] Date of Birth/Sex: Treating RN: 1955-09-03 (67 y.o. Katrinka Blazing Primary Care Tirrell Buchberger: Adron Bene Other Clinician: Referring Ayala Ribble: Treating Murdis Flitton/Extender: Dot Lanes in Treatment: [address] Carolyn [name] ([id number]) [id number] Page 2 of 4 Primary Learner Assessed: Patient Learning Preferences/Education Level/Primary Language Learning Preference: Explanation, Demonstration, Printed Material Highest Education Level: College or Above Preferred Language: Economist Language Barrier: No Translator Needed: No Memory Deficit: No Emotional Barrier: No Cultural/Religious Beliefs Affecting Medical Care: No Physical Barrier Impaired Vision: No Impaired Hearing: No Decreased Hand dexterity: No Knowledge/Comprehension Knowledge Level: High Comprehension Level: High Ability to understand written instructions: High Ability to understand verbal instructions: High Motivation Anxiety Level: Calm Cooperation: Cooperative Education Importance: Acknowledges Need Interest in Health Problems: Asks Questions Perception: Coherent Willingness to Engage in Self-Management High Activities: Readiness to Engage in Self-Management High Activities: Electronic Signature(s) Signed: 07/16/2022 1:26:16 PM By: Karie Schwalbe RN Entered By: Karie Schwalbe on 07/16/2022 08:17:42 -------------------------------------------------------------------------------- Fall Risk Assessment Details Patient Name: Date of Service: Carolyn [name]. 07/16/2022 7:30 A M Medical Record Number:  [id number] Patient Account Number: [id number] Date of Birth/Sex: Treating RN: Jul 30, 1955 (67 y.o. Katrinka Blazing Primary Care Rehman Levinson: Adron Bene Other Clinician: Referring Kalany Diekmann: Treating Lilyan Prete/Extender: Dot Lanes in Treatment: 0 Fall Risk Assessment Items Have you had 2 or more falls in the last 12 monthso 0 No Have you had any fall that resulted in injury in the last 12 monthso 0 Yes FALLS RISK SCREEN History of falling - immediate or within 3 months 0 No Secondary diagnosis (Do you have 2 or more medical diagnoseso) 0 No Ambulatory aid None/bed rest/wheelchair/nurse 0 No Crutches/cane/walker  15 Yes Furniture 0 No Intravenous therapy Access/Saline/Heparin Lock 0 No Gait/Transferring Normal/ bed rest/ wheelchair 0 No Weak (short steps with or without shuffle, stooped but able to lift head while walking, may seek 0 No support from furniture) Impaired (short steps with shuffle, may have difficulty arising from chair, head down, impaired 0 No balance) Mental Status Oriented to own ability 0 No Overestimates or forgets limitations 0 No Risk Level: Low Risk Score: 15 Carolyn [name] ([id number]) [phone] [id number] Page 3 of 4 Electronic Signature(s) -------------------------------------------------------------------------------- Foot Assessment Details Patient Name: Date of Service: Carolyn [name] Carolyn [name]. 07/16/2022 7:30 A M Medical Record Number: [id number] Patient Account Number: [id number] Date of Birth/Sex: Treating RN: [birth year] (67 y.o. Katrinka Blazing Primary Care Andria Head: Adron Bene Other Clinician: Referring Kataleena Holsapple: Treating Afnan Cadiente/Extender: Dot Lanes in Treatment: 0 Foot Assessment Items Site Locations + = Sensation present, - = Sensation absent, [name] = Callus, U = Ulcer R = Redness, W = Warmth, M = Maceration, PU = Pre-ulcerative lesion F = Fissure, S =  Swelling, D = Dryness Assessment Right: Left: Other Deformity: No No Prior Foot Ulcer: No No Prior Amputation: No No Charcot Joint: No No Ambulatory Status: Ambulatory With Help Assistance Device: Cane Gait: Steady Electronic Signature(s) Signed: 07/16/2022 1:26:16 PM By: Karie Schwalbe RN Entered By: Karie Schwalbe on 07/16/2022 08:22:17 -------------------------------------------------------------------------------- Nutrition Risk Screening Details Patient Name: Date of Service: Carolyn [name] Carolyn [name]. 07/16/2022 7:30 A M Medical Record Number: [id number] Patient Account Number: [id number] Date of Birth/Sex: Treating RN: 11-12-1955 (67 y.o. Katrinka Blazing Primary Care Jahsir Rama: Adron Bene Other Clinician: Referring Talene Glastetter: Treating Elvin Mccartin/Extender: Dot Lanes in Treatment: 0 Height (in): 68 Weight (lbs): 105 Body Mass Index (BMI): 16 Carolyn [name] ([id number]) [id number] Page 4 of 4 Nutrition Risk Screening Items Score Screening NUTRITION RISK SCREEN: I have an illness or condition that made me change the kind and/or amount of food I eat 0 No I eat fewer than two meals per day 3 Yes I eat few fruits and vegetables, or milk products 0 No I have three or more drinks of beer, liquor or wine almost every day 0 No I have tooth or mouth problems that make it hard for me to eat 0 No I don't always have enough money to buy the food I need 0 No I eat alone most of the time 0 No I take three or more different prescribed or over-the-counter drugs a day 0 No Without wanting to, I have lost or gained 10 pounds in the last six months 2 Yes I am not always physically able to shop, cook and/or feed myself 0 No Nutrition Protocols Good Risk Protocol Moderate Risk Protocol 0 Provide education on nutrition High Risk Proctocol Risk Level: Moderate Risk Score: 5 Electronic Signature(s) Signed: 07/16/2022 1:26:16 PM  By: Karie Schwalbe RN Entered By: Karie Schwalbe on 07/16/2022 08:19:30

## 2022-07-19 ENCOUNTER — Encounter: Payer: Self-pay | Admitting: Physical Therapy

## 2022-07-19 ENCOUNTER — Ambulatory Visit: Payer: Medicare PPO | Admitting: Physical Therapy

## 2022-07-19 DIAGNOSIS — R2689 Other abnormalities of gait and mobility: Secondary | ICD-10-CM

## 2022-07-19 DIAGNOSIS — M25572 Pain in left ankle and joints of left foot: Secondary | ICD-10-CM | POA: Diagnosis not present

## 2022-07-19 DIAGNOSIS — M25562 Pain in left knee: Secondary | ICD-10-CM

## 2022-07-19 DIAGNOSIS — M6281 Muscle weakness (generalized): Secondary | ICD-10-CM | POA: Diagnosis not present

## 2022-07-19 DIAGNOSIS — M25672 Stiffness of left ankle, not elsewhere classified: Secondary | ICD-10-CM

## 2022-07-19 DIAGNOSIS — R2681 Unsteadiness on feet: Secondary | ICD-10-CM

## 2022-07-19 DIAGNOSIS — R600 Localized edema: Secondary | ICD-10-CM

## 2022-07-19 NOTE — Therapy (Signed)
OUTPATIENT PHYSICAL THERAPY LOWER EXTREMITY TREATMENT   Patient Name: Carolyn [name] MRN: [id number] DOB:08/03/1955, 67 y.o., female Today's Date: 07/19/2022   END OF SESSION:  PT End of Session - 07/19/22 0802     Visit Number 17    Number of Visits 30    Date for PT Re-Evaluation 08/13/22    Authorization Type [employer] Medicare Choice PPO    Authorization Time Period $20 COPAY Approved Authorization #[id number]  Tracking #[id number] 4/15/-5/24    Authorization - Number of Visits 12    Progress Note Due on Visit 20    PT Start Time 0801    PT Stop Time 0842    PT Time Calculation (min) 41 min    Equipment Utilized During Treatment Gait belt    Activity Tolerance Patient tolerated treatment well    Behavior During Therapy WFL for tasks assessed/performed                     Past Medical History:  Diagnosis Date   Alcoholic cirrhosis (HCC)    Chronic pancreatitis (HCC)    Diabetes mellitus without complication (HCC)    Endometriosis    GERD (gastroesophageal reflux disease)    Intractable nausea and vomiting 06/13/2020   Liver disease    Malabsorption    MALABSORPTION SYNDROME   Osteoporosis 03/2011   t score -2.5   Pancreatitis    due to cyst and tumors due to calcifications   Seizure (HCC)    no seizure disorder, controlled w/ meds per patient   Vitamin D deficiency 2012   VIT D 15   Past Surgical History:  Procedure Laterality Date   APPENDECTOMY     BIOPSY  01/19/2022   Procedure: BIOPSY;  Surgeon: Shellia Cleverly, DO;  Location: [facility];  Service: Gastroenterology;;   CHOLECYSTECTOMY     ESOPHAGOGASTRODUODENOSCOPY (EGD) WITH PROPOFOL N/A 01/19/2022   Procedure: ESOPHAGOGASTRODUODENOSCOPY (EGD) WITH PROPOFOL;  Surgeon: Shellia Cleverly, DO;  Location: [facility];  Service: Gastroenterology;  Laterality: N/A;   FEEDING TUBE RELOCATION  2010   JEJUNOSTOMY FEEDING TUBE  1990   MULTIPLE GASTRIC SURGERIES     MYOMECTOMY     OPEN REDUCTION  INTERNAL FIXATION (ORIF) DISTAL RADIAL FRACTURE Left 06/29/2018   Procedure: OPEN REDUCTION INTERNAL FIXATION (ORIF)LEFT  DISTAL RADIAL FRACTURE;  Surgeon: Betha Loa, MD;  Location: [facility];  Service: Orthopedics;  Laterality: Left;   ROUX-EN-Y GASTRIC BYPASS     TIBIA IM NAIL INSERTION Left 04/21/2022   Procedure: LEFT INTRAMEDULLARY (IM) NAIL TIBIAL;  Surgeon: Eldred Manges, MD;  Location: [facility];  Service: Orthopedics;  Laterality: Left;   TIBIA IM NAIL INSERTION Left 05/05/2022   Procedure: LEFT TIBIAL NAIL DISTAL SCREW PLACEMENT;  Surgeon: Eldred Manges, MD;  Location: [facility];  Service: Orthopedics;  Laterality: Left;   TUBAL LIGATION     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   Palliative care patient 06/02/2022   History of gastric ulcer 03/23/2022   Abnormal loss of weight 03/23/2022   Acute on chronic diastolic CHF (congestive heart failure) (HCC) 03/23/2022   Cellulitis of left lower extremity 03/17/2022   Anemia of chronic disease 03/17/2022   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   Abnormal CT of the abdomen 01/19/2022   Chronic gastric  ulcer with perforation (HCC) 01/19/2022   Alcoholic cirrhosis of liver without ascites (HCC) 01/18/2022   Occult blood in stools 01/18/2022   Portal hypertensive gastropathy (HCC) 01/18/2022   Hypoalbuminemia 01/11/2022   Physical deconditioning 01/09/2022   Pressure injury of skin 01/08/2022   Hypokalemia 01/07/2022   Severe protein-calorie malnutrition (HCC) 01/07/2022   Normocytic anemia 01/07/2022   Osteomyelitis (HCC) 01/06/2022   Seizure (HCC) 05/08/2017   Chronic pain syndrome 05/08/2017   Gastroesophageal reflux disease without esophagitis 05/08/2017   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HCC) 05/08/2017    Osteoporosis 03/16/2011   Malabsorption     PCP: General PCP   REFERRING PROVIDER:  Eldred Manges, MD   REFERRING DIAG:  S82.252A (ICD-10-CM) - Displaced comminuted fracture of shaft of left tibia, initial encounter for closed fracture  S82.832A (ICD-10-CM) - Closed fracture of proximal end of left fibula, initial encounter    THERAPY DIAG:  Muscle weakness (generalized)  Acute pain of left knee  Pain in left ankle and joints of left foot  Stiffness of left ankle, not elsewhere classified  Other abnormalities of gait and mobility  Unsteadiness on feet  Localized edema  Rationale for Evaluation and Treatment: Rehabilitation  ONSET DATE: 04/21/2022, 1st ankle sg, 05/05/2022 2nd surgery  SUBJECTIVE:   SUBJECTIVE STATEMENT: The ED ruled out cardiac issues.  She also wound doctor on Friday who thinks ankles are healing. She had to walk quite a bit to find his office.  No falls or issues.    PERTINENT HISTORY: DM, Alcoholism in remission, chronic pancreatitis, hepatic cirrhosis, She had prolonged hospitalization 03/13/2022-1/29/204 with severe sepsis included hypoxic respiratory failure.   PAIN: NPRS scale: today left knee 1/10 & ankle 1/10 and since last PT lowest knee 1/10, ankle 1/10  & highest knee 3/10 ankle 3/10 Pain location: left knee more patella & ankle more medially Pain description: ache,  Aggravating factors:  meds wearing off, over night with increased in morning Relieving factors: meds, elevating & not moving knee / ankle  PRECAUTIONS: Fall  WEIGHT BEARING RESTRICTIONS: Yes LLE WBAT as of 05/18/2022  FALLS:  Has patient fallen in last 6 months? Yes. Number of falls 1  LIVING ENVIRONMENT: Lives with: lives with their spouse and 3 dogs 25, 108 & 50# Lives in: House single story  Stairs: Yes: External: 6 (long width)  steps; on right going up temporary ramp primary entrance Has following equipment at home: Dan Humphreys - 2 wheeled, Wheelchair (manual), Shower bench,  bed side commode, Grab bars, and Ramped entry  OCCUPATION: retired   PLOF: Independent  PATIENT GOALS:   get back to function in house & access community  Next MD visit:  05/18/2022  OBJECTIVE:  DIAGNOSTIC FINDINGS: 05/11/2022: Post distal tibial screw placement.  AP knee AP ankle appear to be in good rotational alignment.   PATIENT SURVEYS:  06/30/2022: FOTO 49% 05/17/2022 / Evaluation:  FOTO intake:  34%  predicted:  47%  COGNITION: 05/17/2022 / Evaluation:   Overall cognitive status: WFL    SENSATION: 05/17/2022 / Evaluation:   WFL for knee area but unable to test ankle as wrapped for wound  EDEMA:  05/17/2022 / Evaluation:  left foot & ankle appear edematous but wrapped due to wound.  POSTURE:  05/17/2022 / Evaluation:  rounded shoulders, forward head, increased thoracic kyphosis, flexed trunk , and weight shift right  PALPATION: 05/17/2022 / Evaluation: not tested due to wrapped for wound  LOWER EXTREMITY ROM:   ROM Left Eval 05/17/22 Left 05/26/22 Left 06/16/22  Hip flexion     Hip extension     Hip abduction     Hip adduction     Hip internal rotation     Hip external rotation     Knee flexion     Knee extension Seated A: LAQ -22* Seated  A: LAQ -11* Seated  A: LAQ -9*  Ankle dorsiflexion  Seated knee flexed A: -3* P: +2*  Seated knee flexed A: +3* P: +6*  Ankle plantarflexion  P & A 19* P & A 21*  Ankle inversion     Ankle eversion     Ankle Not tested at evaluation due to wound with bandages.  (Blank rows = not tested)  LOWER EXTREMITY MMT:  MMT Right eval Left eval  Hip flexion    Hip extension    Hip abduction    Hip adduction    Hip internal rotation    Hip external rotation    Knee flexion  3-/5  Knee extension  3-/5  Ankle dorsiflexion    Ankle plantarflexion    Ankle inversion    Ankle eversion    Ankle Not tested at evaluation due to Toe touch Weightbearing status.  (Blank rows = not tested)   FUNCTIONAL TESTS:  06/30/2022: Timed Up & Go:  with cane 12.90 sec & without AD 11.53 sec  05/17/2022 / Evaluation: 18 inch chair transfer:  requires armrests or RW to push up to arise.  Requires RW support for stabilization  GAIT: 07/07/2022: pt amb 200' without device with supervision. She has LLE toe-in / internal rotation of hip but can correct with verbal cues.   06/28/2022: Pt amb 200' with cane stand alone tip with supervision on flat indoor surfaces. Gait velocity 2.53 ft/sec   05/17/2022 / Evaluation: Distance walked: 30' Assistive device utilized: Environmental consultant - 2 wheeled Level of assistance: SBA Comments: NWB LLE.  Turns with pivoting on foot incorrectly.    TODAY'S TREATMENT                                                                          DATE: 07/19/2022: Therapeutic Exercise: Precor recumbent bike seat 6 with BLEs level 4 for 8 min Step up, over & step down with 2 step lead BUE hovering over //bars on BOSU round side up BLEs 10 reps. PT demo & verbal cues on technique Standing red theraband kicks flexion, abduction, extension, adduction 10 reps BLEs without UE support with minA/CGA for balance. BAPs level 1 standing circles CW & CCW with minA for eversion initially. Standing on foam pad PF reaching overhead 15 reps 1st set, then moving UEs right & left during PF for inversion / eversion 15 reps ea Gastroc stretch on step heel depression 30 sec hold 2 reps Soleus stretch on step heel depression 30 sec hold 2 reps Seated knee flexed & standing knee extended LLE hip external rotation red theraband 15 reps. Seated ankle eversion red theraband 15 reps Leg press BLEs 100# 15 reps 2 sets, LLE only 37# 15 reps 2 sets  Neuromuscular Re-education: Braiding, tandem & backwards gait with supervision / CGA. Outside //bars today. Side stepping up/down ramp to left & to right with supervision.  Working on controlling inversion /  eversion.  Gait Training: Pt arrived / exited PT amb with cane with general supervision.  Pt amb 400'  without device with SBA. Cues on not toe-in LLE. Pt neg ramp without device 3 reps with 1 LOB self correcting.    07/14/2022: BP 127/81 HR 90 SpO2 99% She went to bathroom and threw up yellow bile.   She took potassium & lasix which she has not taken in 2 days.  PT continued to monitor with pulse oximeter. With seated rest her HR continued to increase up to 104 bpm.  CMA for Dr. Ophelia Charter came to PT session.  Patient reports she felt better after throwing up but chest still did not feel right. Denies heaviness or pain above waist.  PT & CMA recommended immediately going to ED to be checked out.  PT escorted her to car & husband/boyfriend verbalized would take her 3 blocks to ED at [facility] per our recommendations.   07/12/2022: Therapeutic Exercise: Precor recumbent bike seat 6 with BLEs level 4 for 8 min Step up, over & step down with 2 step lead BUE support //bars on BOSU round side up BLEs 10 reps. PT demo & verbal cues on technique Standing red theraband kicks flexion, abduction, extension, adduction 10 reps BLEs without UE support with minA/CGA for balance. BAPs level 1 standing circles CW & CCW with minA for eversion initially. Standing on foam pad PF reaching overhead 15 reps 1st set, then moving UEs right left during PF for inversion / eversion 15 reps Gastroc stretch on step heel depression 30 sec hold 2 reps Hamstring stretch long sit strap DF trunk flexion 30 sec hold 2 reps Seated LLE hip external rotation red theraband 15 reps.  Neuromuscular Re-education: Braiding & backwards gait with supervision. Outside //bars today.   Gait Training: Pt arrived / exited PT amb with cane with general supervision.  Pt amb 400' without device with SBA. Cues on not toe-in LLE. Worked on scanning & eyes closed    HOME EXERCISE PROGRAM: Access Code: [email or web address] URL: [email or web address] Date: 06/09/2022 Prepared by: Vladimir Faster  Exercises - seated marching  - 2 x daily -  7 x weekly - 3 sets - 10 reps - 5 seconds hold - Seated Hip Abduction  - 2 x daily - 7 x weekly - 3 sets - 10 reps - 5 seconds hold - Seated Hamstring Stretch  - 2 x daily - 7 x weekly - 1 sets - 3 reps - 20-30 seconds hold - Seated Ankle Pumps  - 2 x daily - 7 x weekly - 3 sets - 10 reps - 5 seconds hold - Seated Ankle Circles  - 2 x daily - 7 x weekly - 3 sets - 10 reps - Seated Leg Press with Resistance  - 1 x daily - 7 x weekly - 2 sets - 10 reps - 5 seconds hold - Seated Ankle Plantarflexion with Resistance  - 1 x daily - 7 x weekly - 2 sets - 10 reps - 5 seconds hold - Ankle Dorsiflexion with Resistance  - 1 x daily - 7 x weekly - 2 sets - 10 reps - 5 seconds hold - Seated Figure 4 Ankle Inversion with Resistance  - 1 x daily - 7 x weekly - 2 sets - 10 reps - 5 seconds hold - Seated Ankle Eversion with Anchored Resistance  - 1 x daily - 7 x weekly - 1 sets - 10 reps - 5 seconds hold - Standing Gastroc  Stretch on Step  - 1 x daily - 7 x weekly - 1 sets - 2-3 reps - 30 seconds hold - Standing Soleus Stretch on Step  - 1 x daily - 7 x weekly - 1 sets - 2-3 reps - 30 seconds hold  ASSESSMENT: CLINICAL IMPRESSION: Patient's balance continues to improve.  PT exercises and activities continue to improve her strength.   OBJECTIVE IMPAIRMENTS: Abnormal gait, cardiopulmonary status limiting activity, decreased activity tolerance, decreased balance, decreased coordination, decreased endurance, decreased knowledge of condition, decreased knowledge of use of DME, decreased mobility, difficulty walking, decreased ROM, decreased strength, increased edema, increased muscle spasms, impaired flexibility, impaired sensation, postural dysfunction, and pain.   ACTIVITY LIMITATIONS: carrying, lifting, bending, standing, squatting, sleeping, stairs, transfers, reach over head, and locomotion level  PARTICIPATION LIMITATIONS: meal prep, cleaning, driving, and community activity  PERSONAL FACTORS: Fitness, Time  since onset of injury/illness/exacerbation, and 3+ comorbidities: see PMH  are also affecting patient's functional outcome.   REHAB POTENTIAL: Good  CLINICAL DECISION MAKING: Stable/uncomplicated  EVALUATION COMPLEXITY: Low   GOALS: Goals reviewed with patient? Yes  SHORT TERM GOALS: (target date for Short term goals are 30 days 07/16/2022)   1.  Patient will demonstrate independent use of updated home exercise program to maintain progress from in clinic treatments.  Goal status: MET 07/12/2022  2. Patient amb 200' & neg ramps/curbs without device with supervision.  Goal Status: MET 07/12/2022  LONG TERM GOALS: (target dates for all long term goals are 90 days 08/13/2022)   1. Patient will demonstrate/report pain at worst less than or equal to 2/10 to facilitate minimal limitation in daily activity secondary to pain symptoms. Goal status: Ongoing 07/07/2022   2. Patient will demonstrate independent use of home exercise program to facilitate ability to maintain/progress functional gains from skilled physical therapy services. Goal status: Ongoing 07/07/2022   3. Patient will demonstrate FOTO outcome > or = 47 % to indicate reduced disability due to condition.  Goal status: MET 06/30/2022   4.  Patient will demonstrate LLE MMT 5/5 throughout to faciltiate usual transfers, stairs, squatting at [facility] for daily life.   Goal status: Ongoing 07/07/2022   5.  Patient ambulates >500' with LRAD and neg ramp, curb & stairs single rail modified independent.  Goal status: New   6.  AROM of Left knee [facility] & left ankle within 75% of right ankle Goal status: Ongoing 07/07/2022   PLAN:  PT FREQUENCY: 2-3 x/week  PT DURATION: 13 weeks (90 days)  PLANNED INTERVENTIONS: Therapeutic exercises, Therapeutic activity, Neuro Muscular re-education, Balance training, Gait training, Patient/Family education, Joint mobilization, Stair training, DME instructions, Dry Needling, Electrical stimulation,  Traction, Cryotherapy, vasopneumatic deviceMoist heat, Taping, Ultrasound, Ionotophoresis 4mg /ml Dexamethasone, and Manual therapy.  All included unless contraindicated  PLAN FOR NEXT SESSION: continue work towards LTGs, update HEP.  work on gait & balance without device, Cont progressing there exer & therapeutic activities to tolerance including balance activities   Vladimir Faster, PT, DPT 07/19/2022, 9:29 AM

## 2022-07-20 ENCOUNTER — Other Ambulatory Visit: Payer: Self-pay | Admitting: Internal Medicine

## 2022-07-20 NOTE — Progress Notes (Signed)
Carolyn [name], Carolyn [name] ([id number]) [id number] Page 1 of 7 Visit Report for 07/16/2022 Chief Complaint Document Details Patient Name: Date of Service: Carolyn [name] [place] C. 07/16/2022 7:30 A M Medical Record Number: [id number] Patient Account Number: [id number] Date of Birth/Sex: Treating RN: 12-Oct-1955 (67 y.o. F) Primary Care Provider: Adron Bene Other Clinician: Referring Provider: Treating Provider/Extender: Dot Lanes in Treatment: 0 Information Obtained from: Patient Chief Complaint Patient is at the clinic for treatment of an open pressure ulcer Electronic Signature(s) Signed: 07/16/2022 9:13:26 AM By: Duanne Guess MD FACS Entered By: Duanne Guess on 07/16/2022 09:13:26 -------------------------------------------------------------------------------- HPI Details Patient Name: Date of Service: Carolyn [name] [place] C. 07/16/2022 7:30 A M Medical Record Number: [id number] Patient Account Number: [id number] Date of Birth/Sex: Treating RN: 12/04/1955 (67 y.o. F) Primary Care Provider: Adron Bene Other Clinician: Referring Provider: Treating Provider/Extender: Dot Lanes in Treatment: 0 History of Present Illness HPI Description: ADMISSION 07/16/2022 This is a 67 year old woman with a past medical history significant for chronic pancreatitis, alcoholic cirrhosis without ascites, severe protein calorie malnutrition, congestive heart failure, and extreme low body weight (BMI 16.6). She is not diabetic. She has had periodic issues where, due to her significant lack of body fat and muscle, her medial malleoli rub and press against each other, causing pressure ulcers. Her most recent episode occurred starting in January or February of this year. She has been treating these with initially Silvadene and now bacitracin. She has foam doughnuts that she applies and she is sleeping with padding between her  legs to avoid further pressure. Based on photographs in the electronic medical record from March, there has been significant healing. In fact, the wounds are very nearly closed as of today's visit. Electronic Signature(s) Signed: 07/16/2022 9:22:51 AM By: Duanne Guess MD FACS Previous Signature: 07/16/2022 9:15:01 AM Version By: Duanne Guess MD FACS Entered By: Duanne Guess on 07/16/2022 09:22:51 -------------------------------------------------------------------------------- Physical Exam Details Patient Name: Date of Service: Carolyn [name] [place] C. 07/16/2022 7:30 A M Medical Record Number: [id number] Patient Account Number: [id number] Date of Birth/Sex: Treating RN: [birth year] (67 y.o. F) Primary Care Provider: Adron Bene Other Clinician: Referring Provider: Treating Provider/Extender: Dot Lanes in Treatment: 0 Constitutional . . . . She is cachectic. Notes 07/16/2022: On each of her medial malleoli, there is evidence of pressure injury. On the left, it is stage I, and on the right, stage II. No concern for infection. Electronic Signature(s) Signed: 07/16/2022 9:24:58 AM By: Duanne Guess MD FACS [place],SignedSalena Saner ([id number]) AM By: Duanne Guess MD FACS [place] 07/16/2022 9:24:58 [id number] Page 2 of 7 Entered By: Duanne Guess on 07/16/2022 09:24:57 -------------------------------------------------------------------------------- Physician Orders Details Patient Name: Date of Service: Carolyn [name] [name] 07/16/2022 7:30 A M Medical Record Number: [id number] Patient Account Number: [id number] Date of Birth/Sex: Treating RN: [birth year] (67 y.o. 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 - Dr. Lady Gary Room 3 Return A Anesthetic Wound #1 Left,Medial Ankle (In clinic) Topical Lidocaine 5% applied to wound bed Wound #2 Right,Medial Ankle (In clinic) Topical Lidocaine 5% applied to wound  bed Bathing/ Shower/ Hygiene May shower and wash wound with soap and water. Edema Control - Lymphedema / SCD / Other Bilateral Lower Extremities Avoid standing for long periods of time. Exercise regularly - As Tolerated Moisturize legs daily. Off-Loading Other: - Elevate legs when sitting to heart level or above heart level Additional Orders / Instructions Follow Nutritious Diet - Keep trying to increase protein in daily diet. Wound Treatment Wound #1 - Ankle Wound Laterality: Left, Medial Topical: Mupirocin Ointment 1 x Per Day/30 Days Discharge Instructions: Apply Mupirocin (Bactroban) in clinic Topical: Bacitracin 1 x Per Day/30 Days Discharge Instructions: Use this at home Secondary Dressing: Bordered Gauze, 2x2 in (DME) (Generic) 1 x Per Day/30 Days Discharge Instructions: Apply over primary dressing as directed. Secondary Dressing: DONUT 1 x Per Day/30 Days Discharge Instructions: Use donut at home Wound #2 - Ankle Wound Laterality: Right, Medial Topical: Mupirocin Ointment 1 x Per Day/30 Days Discharge Instructions: Apply Mupirocin (Bactroban) in clinic Topical: Bacitracin 1 x Per Day/30 Days Discharge Instructions: Use this at home Secondary Dressing: Bordered Gauze, 2x2 in (DME) (Generic) 1 x Per Day/30 Days Discharge Instructions: Apply over primary dressing as directed. Carolyn [name], Carolyn [name] ([id number]) [id number] Page 3 of 7 Secondary Dressing:  DONUT 1 x Per Day/30 Days Discharge Instructions: Use donut at home Electronic Signature(s) Signed: 07/16/2022 5:53:18 PM By: Duanne Guess MD FACS Entered By: Duanne Guess on 07/16/2022 09:25:16 -------------------------------------------------------------------------------- Problem List Details Patient Name: Date of Service: Carolyn [name] [place] C. 07/16/2022 7:30 A M Medical Record Number: [id number] Patient Account Number: [id number] Date of Birth/Sex: Treating RN: 1956/01/28 (67 y.o. 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.0 Alcohol-induced chronic pancreatitis 07/16/2022 No Yes K70.30 Alcoholic cirrhosis of liver without ascites 07/16/2022 No Yes G89.4 Chronic pain syndrome 07/16/2022 No Yes E43 Unspecified severe protein-calorie malnutrition 07/16/2022 No Yes Inactive Problems Resolved Problems Electronic Signature(s) Signed: 07/16/2022 9:09:46 AM By: Duanne Guess MD FACS Previous Signature: 07/16/2022 7:57:06 AM Version By: Duanne Guess MD FACS Entered By: Duanne Guess on 07/16/2022 09:09:46 -------------------------------------------------------------------------------- Progress Note Details Patient Name: Date of Service: Carolyn [name] [place] C. 07/16/2022 7:30 A M Medical Record Number: [id number] Patient Account Number: [id number] Date of Birth/Sex: Treating RN: [birth year] (67 y.o. F) Primary Care Provider: Adron Bene Other Clinician: Referring Provider: Treating Provider/Extender: Dot Lanes in Treatment: [address] JOLLENE, RAWL ([id number]) [id number] Page 4 of 7 Subjective Chief  Complaint Information obtained from Patient Patient is at the clinic for treatment of an open pressure ulcer History of Present Illness (HPI) ADMISSION 07/16/2022 This is a 67 year old woman with a past medical history significant for chronic pancreatitis, alcoholic cirrhosis without ascites, severe protein calorie malnutrition, congestive heart failure, and extreme low body weight (BMI 16.6). She is not diabetic. She has had periodic issues where, due to her significant lack of body fat and muscle, her medial malleoli rub and press against each other, causing pressure ulcers. Her most recent episode occurred starting in January or February of this year. She has been treating these with initially Silvadene and now bacitracin. She has foam doughnuts that she applies and she is sleeping with padding between her legs to avoid further pressure. Based on photographs in the electronic medical record from March,  there has been significant healing. In fact, the wounds are very nearly closed as of today's visit. Patient History Information obtained from Patient, Chart. Allergies Peanut (Legumes) (Reaction: anaphylaxis) Family History Cancer - Father,Maternal Grandparents. Social History Never smoker, Marital Status - Single, Drug Use - No History, Caffeine Use - Never. Medical History Gastrointestinal Patient has history of Cirrhosis - alcoholic Endocrine Denies history of Type II Diabetes Neurologic Patient has history of Seizure Disorder Hospitalization/Surgery History - tibia IM nail insertion Left 04/2022. - appendectomy. - cholecystectomy. - myomectomy. - multiple gastric surgeries. Medical A Surgical History Notes nd Constitutional Symptoms (General Health) ETOH Gastrointestinal GERD pancreatitis Genitourinary Malabsorption Syndrome Musculoskeletal osteoporosis Review of Systems (ROS) Integumentary (Skin) Complains or has symptoms of Wounds - Land R Medial Ankles. General Notes:  LEFT tibia IM nail insertion Objective Constitutional She is cachectic. Vitals Time Taken: 8:07 AM, Height: 68 in, Source: Stated, Weight: 105 lbs, Source: Stated, BMI: 16, Temperature: 98.6 F, Pulse: 100 bpm, Respiratory Rate: 16 breaths/min, Blood Pressure: 121/61 mmHg. General Notes: 07/16/2022: On each of her medial malleoli, there is evidence of pressure injury. On the left, it is stage I, and on the right, stage II. No concern for infection. Integumentary (Hair, Skin) Wound #1 status is Open. Original cause of wound was Gradually Appeared. The date acquired was: 04/13/2022. The wound is located on the Left,Medial Ankle. The wound measures 0.2cm length x 0.2cm width x 0.1cm depth; 0.031cm^2 area and 0.003cm^3 volume. There is Fat Layer (Subcutaneous Tissue) exposed. There is no tunneling or undermining noted. There is a medium amount of serosanguineous drainage noted. There is large (67-100%) red granulation within the wound bed. There is a small (1-33%) amount of necrotic tissue within the wound bed including Adherent Slough. The periwound skin appearance had no abnormalities noted for texture. The periwound skin appearance had no abnormalities noted for moisture. The periwound skin appearance had no abnormalities noted for color. Periwound temperature was noted as No Abnormality. Wound #2 status is Open. Original cause of wound was Gradually Appeared. The date acquired was: 04/13/2022. The wound is located on the Right,Medial Ankle. The wound measures 0.4cm length x 0.2cm width x 0.1cm depth; 0.063cm^2 area and 0.006cm^3 volume. There is Fat Layer (Subcutaneous Tissue) exposed. There is no tunneling or undermining noted. There is a medium amount of serosanguineous drainage noted. There is large (67-100%) red, hyper - granulation within the wound bed. There is a small (1-33%) amount of necrotic tissue within the wound bed including Adherent Slough. The periwound skin appearance had no  abnormalities noted for texture. The periwound skin appearance had no abnormalities noted for moisture. The periwound skin appearance had no Carolyn [name], Carolyn [name] ([id number]) [phone].pdf Page 5 of 7 abnormalities noted for color. Periwound temperature was noted as No Abnormality. Assessment Active Problems ICD-10 Pressure ulcer of right ankle, stage 2 Pressure ulcer of left ankle, stage 1 Alcohol-induced chronic pancreatitis Alcoholic cirrhosis of liver without ascites Chronic pain syndrome Unspecified severe protein-calorie malnutrition Plan Follow-up Appointments: Return Appointment in 2 weeks. - Dr. Lady Gary Room 3 Anesthetic: Wound #1 Left,Medial Ankle: (In clinic) Topical Lidocaine 5% applied to wound bed Wound #2 Right,Medial Ankle: (In clinic) Topical Lidocaine 5% applied to wound bed Bathing/ Shower/ Hygiene: May shower and wash wound with soap and water. Edema Control - Lymphedema / SCD / Other: Avoid standing for long periods of time. Exercise regularly - As Tolerated Moisturize legs daily. Off-Loading: Other: - Elevate legs when sitting to heart level or above heart level Additional  Orders / Instructions: Follow Nutritious Diet - Keep trying to increase protein in daily diet. WOUND #1: - Ankle Wound Laterality: Left, Medial Topical: Mupirocin Ointment 1 x Per Day/30 Days Discharge Instructions: Apply Mupirocin (Bactroban) in clinic Topical: Bacitracin 1 x Per Day/30 Days Discharge Instructions: Use this at home Secondary Dressing: Bordered Gauze, 2x2 in (DME) (Generic) 1 x Per Day/30 Days Discharge Instructions: Apply over primary dressing as directed. Secondary Dressing: DONUT 1 x Per Day/30 Days Discharge Instructions: Use donut at home WOUND #2: - Ankle Wound Laterality: Right, Medial Topical: Mupirocin Ointment 1 x Per Day/30 Days Discharge Instructions: Apply Mupirocin (Bactroban) in clinic Topical: Bacitracin 1 x Per Day/30  Days Discharge Instructions: Use this at home Secondary Dressing: Bordered Gauze, 2x2 in (DME) (Generic) 1 x Per Day/30 Days Discharge Instructions: Apply over primary dressing as directed. 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 She has pressure ulcers on her bilateral medial malleoli secondary to her extreme low body weight. She has done an excellent job managing these on her own and is taking appropriate steps to try and improve her nutritional status and gaining weight. She is also padding and protecting her wounds appropriately. She does not require any debridement today. She has been applying bacitracin and foam donut protectors and I think she can continue to do this. I will have her follow-up in 2 weeks for repeat evaluation. Electronic Signature(s) Signed: 07/16/2022 9:26:36 AM By: Duanne Guess MD FACS Entered By: Duanne Guess on 07/16/2022 09:26:36 -------------------------------------------------------------------------------- HxROS Details Patient Name: Date of Service: Carolyn [name] [place] C. 07/16/2022 7:30 A M Medical Record Number: [id number] Patient Account Number: [id number] Date of Birth/Sex: Treating RN: [birth year] (67 y.o. Arta Silence Primary Care Provider: Adron Bene Other Clinician: Referring Provider: Treating Provider/Extender: Dot Lanes in Treatment: [address] Carolyn [name], Carolyn [name] ([id number]) [id number] Page 6 of 7 Information Obtained From Patient Chart Integumentary (Skin) Complaints and Symptoms: Positive for: Wounds - Land R Medial Ankles Constitutional Symptoms (General Health) Medical History: Past Medical History Notes: ETOH Gastrointestinal Medical History: Positive for: Cirrhosis - alcoholic Past  Medical History Notes: GERD pancreatitis Endocrine Medical History: Negative for: Type II Diabetes Genitourinary Medical History: Past Medical History Notes: Malabsorption Syndrome Musculoskeletal Medical History: Past Medical History Notes: osteoporosis Neurologic Medical History: Positive for: Seizure Disorder Oncologic Immunizations Pneumococcal Vaccine: Received Pneumococcal Vaccination: No Implantable Devices Yes Hospitalization / Surgery History Type of Hospitalization/Surgery tibia IM nail insertion Left 04/2022 appendectomy cholecystectomy myomectomy multiple gastric surgeries Family and Social History Cancer: Yes - Father,Maternal Grandparents; Never smoker; Marital Status - Single; Drug Use: No History; Caffeine Use: Never; Financial Concerns: No; Food, Clothing or Shelter Needs: No; Support System Lacking: No; Transportation Concerns: No Notes LEFT tibia IM nail insertion Electronic Signature(s) Signed: 07/16/2022 1:26:16 PM By: Karie Schwalbe RN Signed: 07/16/2022 5:53:18 PM By: Duanne Guess MD FACS Signed: 07/19/2022 5:08:59 PM By: Shawn Stall RN, BSN Entered By: Karie Schwalbe on 07/16/2022 08:16:06 Carolyn [name], Carolyn [name] ([id number]) [id number] Page 7 of 7 -------------------------------------------------------------------------------- SuperBill Details Patient Name: Date of Service: Carolyn [name] [name] 07/16/2022 Medical Record Number: [id number] Patient Account Number: [id number] Date of Birth/Sex: Treating RN: [birth year] (67 y.o. 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 K70.30 Alcoholic cirrhosis of liver without  ascites G89.4 Chronic pain syndrome E43 Unspecified severe protein-calorie malnutrition Facility Procedures : CPT4 Code: [id number] Description: 99214 - WOUND CARE VISIT-LEV 4 EST PT Modifier: Quantity: 1 Physician Procedures : CPT4 Code Description Modifier [id number] - WC PHYS LEVEL 4 - NEW PT ICD-10 Diagnosis Description L89.512 Pressure ulcer of right ankle, stage 2 L89.521 Pressure ulcer of left ankle, stage 1 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

## 2022-07-21 ENCOUNTER — Ambulatory Visit: Payer: Medicare PPO | Admitting: Physical Therapy

## 2022-07-21 ENCOUNTER — Encounter: Payer: Self-pay | Admitting: Physical Therapy

## 2022-07-21 DIAGNOSIS — M6281 Muscle weakness (generalized): Secondary | ICD-10-CM

## 2022-07-21 DIAGNOSIS — R2681 Unsteadiness on feet: Secondary | ICD-10-CM

## 2022-07-21 DIAGNOSIS — M25672 Stiffness of left ankle, not elsewhere classified: Secondary | ICD-10-CM | POA: Diagnosis not present

## 2022-07-21 DIAGNOSIS — R600 Localized edema: Secondary | ICD-10-CM

## 2022-07-21 DIAGNOSIS — M25562 Pain in left knee: Secondary | ICD-10-CM

## 2022-07-21 DIAGNOSIS — M25572 Pain in left ankle and joints of left foot: Secondary | ICD-10-CM | POA: Diagnosis not present

## 2022-07-21 DIAGNOSIS — R2689 Other abnormalities of gait and mobility: Secondary | ICD-10-CM

## 2022-07-21 NOTE — Therapy (Signed)
OUTPATIENT PHYSICAL THERAPY LOWER EXTREMITY TREATMENT   Patient Name: CECILA HACKBARTH MRN: [id number] DOB:1956-02-03, 67 y.o., female Today's Date: 07/21/2022   END OF SESSION:  PT End of Session - 07/21/22 0803     Visit Number 18    Number of Visits 30    Date for PT Re-Evaluation 08/13/22    Authorization Type [employer] PPO    Authorization Time Period $20 COPAY Approved Authorization #[id number]  Tracking #[id number] 4/15/-5/24    Authorization - Visit Number 6    Authorization - Number of Visits 12    Progress Note Due on Visit 20    PT Start Time 0801    PT Stop Time 0840    PT Time Calculation (min) 39 min    Equipment Utilized During Treatment Gait belt    Activity Tolerance Patient tolerated treatment well    Behavior During Therapy WFL for tasks assessed/performed                      Past Medical History:  Diagnosis Date   Alcoholic cirrhosis (HCC)    Chronic pancreatitis (HCC)    Diabetes mellitus without complication (HCC)    Endometriosis    GERD (gastroesophageal reflux disease)    Intractable nausea and vomiting 06/13/2020   Liver disease    Malabsorption    MALABSORPTION SYNDROME   Osteoporosis 03/2011   t score -2.5   Pancreatitis    due to cyst and tumors due to calcifications   Seizure (HCC)    no seizure disorder, controlled w/ meds per patient   Vitamin D deficiency 2012   VIT D 15   Past Surgical History:  Procedure Laterality Date   APPENDECTOMY     BIOPSY  01/19/2022   Procedure: BIOPSY;  Surgeon: Shellia Cleverly, DO;  Location: [facility];  Service: Gastroenterology;;   CHOLECYSTECTOMY     ESOPHAGOGASTRODUODENOSCOPY (EGD) WITH PROPOFOL N/A 01/19/2022   Procedure: ESOPHAGOGASTRODUODENOSCOPY (EGD) WITH PROPOFOL;  Surgeon: Shellia Cleverly, DO;  Location: [facility];  Service: Gastroenterology;  Laterality: N/A;   FEEDING TUBE RELOCATION  2010   JEJUNOSTOMY FEEDING TUBE  1990   MULTIPLE GASTRIC SURGERIES      MYOMECTOMY     OPEN REDUCTION INTERNAL FIXATION (ORIF) DISTAL RADIAL FRACTURE Left 06/29/2018   Procedure: OPEN REDUCTION INTERNAL FIXATION (ORIF)LEFT  DISTAL RADIAL FRACTURE;  Surgeon: Betha Loa, MD;  Location: [facility];  Service: Orthopedics;  Laterality: Left;   ROUX-EN-Y GASTRIC BYPASS     TIBIA IM NAIL INSERTION Left 04/21/2022   Procedure: LEFT INTRAMEDULLARY (IM) NAIL TIBIAL;  Surgeon: Eldred Manges, MD;  Location: [facility];  Service: Orthopedics;  Laterality: Left;   TIBIA IM NAIL INSERTION Left 05/05/2022   Procedure: LEFT TIBIAL NAIL DISTAL SCREW PLACEMENT;  Surgeon: Eldred Manges, MD;  Location: [facility];  Service: Orthopedics;  Laterality: Left;   TUBAL LIGATION     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   Palliative care patient 06/02/2022   History of gastric ulcer 03/23/2022   Abnormal loss of weight 03/23/2022   Acute on chronic diastolic CHF (congestive heart failure) (HCC) 03/23/2022   Cellulitis of left lower extremity 03/17/2022   Anemia of chronic disease 03/17/2022   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   Abnormal  CT of the abdomen 01/19/2022   Chronic gastric ulcer with perforation (HCC) 01/19/2022   Alcoholic cirrhosis of liver without ascites (HCC) 01/18/2022   Occult blood in stools 01/18/2022   Portal hypertensive gastropathy (HCC) 01/18/2022   Hypoalbuminemia 01/11/2022   Physical deconditioning 01/09/2022   Pressure injury of skin 01/08/2022   Hypokalemia 01/07/2022   Severe protein-calorie malnutrition (HCC) 01/07/2022   Normocytic anemia 01/07/2022   Osteomyelitis (HCC) 01/06/2022   Seizure (HCC) 05/08/2017   Chronic pain syndrome 05/08/2017   Gastroesophageal reflux disease without esophagitis 05/08/2017   Chronic alcoholic  pancreatitis (HCC) 05/08/2017   Osteoporosis 03/16/2011   Malabsorption     PCP: General PCP   REFERRING PROVIDER:  Eldred Manges, MD   REFERRING DIAG:  S82.252A (ICD-10-CM) - Displaced comminuted fracture of shaft of left tibia, initial encounter for closed fracture  S82.832A (ICD-10-CM) - Closed fracture of proximal end of left fibula, initial encounter    THERAPY DIAG:  Muscle weakness (generalized)  Acute pain of left knee  Pain in left ankle and joints of left foot  Stiffness of left ankle, not elsewhere classified  Other abnormalities of gait and mobility  Unsteadiness on feet  Localized edema  Rationale for Evaluation and Treatment: Rehabilitation  ONSET DATE: 04/21/2022, 1st ankle sg, 05/05/2022 2nd surgery  SUBJECTIVE:   SUBJECTIVE STATEMENT:  She is now driving herself.  She is working outside more. She uses cane for walking in yard.  PERTINENT HISTORY: DM, Alcoholism in remission, chronic pancreatitis, hepatic cirrhosis, She had prolonged hospitalization 03/13/2022-1/29/204 with severe sepsis included hypoxic respiratory failure.   PAIN: NPRS scale: today left knee  1/10 & ankle 1/10 and since last PT lowest knee 1/10, ankle 1/10  & highest knee 3/10 ankle 3/10 Pain location: left knee more patella & ankle more medially Pain description: ache,  Aggravating factors:  meds wearing off, over night with increased in morning Relieving factors: meds, elevating & not moving knee / ankle  PRECAUTIONS: Fall  WEIGHT BEARING RESTRICTIONS: Yes LLE WBAT as of 05/18/2022  FALLS:  Has patient fallen in last 6 months? Yes. Number of falls 1  LIVING ENVIRONMENT: Lives with: lives with their spouse and 3 dogs 59, 64 & 50# Lives in: House single story  Stairs: Yes: External: 6 (long width)  steps; on right going up temporary ramp primary entrance Has following equipment at home: Dan Humphreys - 2 wheeled, Wheelchair (manual), Shower bench, bed side commode, Grab bars, and Ramped  entry  OCCUPATION: retired   PLOF: Independent  PATIENT GOALS:   get back to function in house & access community  Next MD visit:  05/18/2022  OBJECTIVE:  DIAGNOSTIC FINDINGS: 05/11/2022: Post distal tibial screw placement.  AP knee AP ankle appear to be in good rotational alignment.   PATIENT SURVEYS:  06/30/2022: FOTO 49% 05/17/2022 / Evaluation:  FOTO intake:  34%  predicted:  47%  COGNITION: 05/17/2022 / Evaluation:   Overall cognitive status: WFL    SENSATION: 05/17/2022 / Evaluation:   WFL for knee area but unable to test ankle as wrapped for wound  EDEMA:  05/17/2022 / Evaluation:  left foot & ankle appear edematous but wrapped due to wound.  POSTURE:  05/17/2022 / Evaluation:  rounded shoulders, forward head, increased thoracic kyphosis, flexed trunk , and weight shift right  PALPATION: 05/17/2022 / Evaluation: not tested due to wrapped for wound  LOWER EXTREMITY ROM:   ROM Left Eval 05/17/22 Left 05/26/22 Left 06/16/22  Hip flexion  Hip extension     Hip abduction     Hip adduction     Hip internal rotation     Hip external rotation     Knee flexion     Knee extension Seated A: LAQ -22* Seated  A: LAQ -11* Seated  A: LAQ -9*  Ankle dorsiflexion  Seated knee flexed A: -3* P: +2*  Seated knee flexed A: +3* P: +6*  Ankle plantarflexion  P & A 19* P & A 21*  Ankle inversion     Ankle eversion     Ankle Not tested at evaluation due to wound with bandages.  (Blank rows = not tested)  LOWER EXTREMITY MMT:  MMT Right eval Left eval  Hip flexion    Hip extension    Hip abduction    Hip adduction    Hip internal rotation    Hip external rotation    Knee flexion  3-/5  Knee extension  3-/5  Ankle dorsiflexion    Ankle plantarflexion    Ankle inversion    Ankle eversion    Ankle Not tested at evaluation due to Toe touch Weightbearing status.  (Blank rows = not tested)   FUNCTIONAL TESTS:  06/30/2022: Timed Up & Go: with cane 12.90 sec & without AD 11.53  sec  05/17/2022 / Evaluation: 18 inch chair transfer:  requires armrests or RW to push up to arise.  Requires RW support for stabilization  GAIT: 07/21/2022: Pt amb with cane community modified independent. Pt amb on level surfaces without device with verbal cues only for proper LLE rotation.   07/07/2022: pt amb 200' without device with supervision. She has LLE toe-in / internal rotation of hip but can correct with verbal cues.   06/28/2022: Pt amb 200' with cane stand alone tip with supervision on flat indoor surfaces. Gait velocity 2.53 ft/sec   05/17/2022 / Evaluation: Distance walked: 30' Assistive device utilized: Environmental consultant - 2 wheeled Level of assistance: SBA Comments: NWB LLE.  Turns with pivoting on foot incorrectly.    TODAY'S TREATMENT                                                                          DATE: 07/21/2022: Therapeutic Exercise: Leg Press BLEs 106# 15 reps 2 sets;  LLE only 43# 10 reps 2 sets Step up, over & step down with 2 step lead BUE hovering over //bars on BOSU round side up BLEs 10 reps. PT demo & verbal cues on technique Standing green theraband kicks flexion, abduction, extension, adduction 10 reps BLEs without UE support with minA/CGA for balance. BAPs level 1 standing circles CW & CCW 10 reps ea with 2 rods 1/2# ea medial slots to assist eversion.  Standing on foam pad PF reaching overhead 15 reps 1st set, then moving UEs right & left during PF for inversion / eversion 15 reps ea Gastroc stretch on step heel depression 30 sec hold 2 reps Soleus stretch on step heel depression 30 sec hold 2 reps Seated knee flexed & standing knee extended LLE hip external rotation red theraband 15 reps ea. Seated ankle eversion red theraband 15 reps Precor recumbent bike seat 6 with BLEs level 4 for 5 min  Neuromuscular Re-education: Braiding, tandem & backwards gait with supervision / CGA. Outside //bars today. Side stepping up/down ramp to left & to right with  supervision.  Working on controlling inversion / eversion. Sidestepping on foam including transitioning on/off foam surfaces 3 laps with no touch required.   Gait Training: Pt arrived / exited PT amb with cane with general supervision.  Pt amb 400' without device with SBA. Cues on not toe-in LLE. Pt neg ramp without device 3 reps with 1 LOB self correcting.   07/19/2022: Therapeutic Exercise: Precor recumbent bike seat 6 with BLEs level 4 for 8 min Step up, over & step down with 2 step lead BUE hovering over //bars on BOSU round side up BLEs 10 reps. PT demo & verbal cues on technique Standing red theraband kicks flexion, abduction, extension, adduction 10 reps BLEs without UE support with minA/CGA for balance. BAPs level 1 standing circles CW & CCW with minA for eversion initially. Standing on foam pad PF reaching overhead 15 reps 1st set, then moving UEs right & left during PF for inversion / eversion 15 reps ea Gastroc stretch on step heel depression 30 sec hold 2 reps Soleus stretch on step heel depression 30 sec hold 2 reps Seated knee flexed & standing knee extended LLE hip external rotation red theraband 15 reps. Seated ankle eversion red theraband 15 reps Leg press BLEs 100# 15 reps 2 sets, LLE only 37# 15 reps 2 sets  Neuromuscular Re-education: Braiding, tandem & backwards gait with supervision / CGA. Outside //bars today. Side stepping up/down ramp to left & to right with supervision.  Working on controlling inversion / eversion.  Gait Training: Pt arrived / exited PT amb with cane with general supervision.  Pt amb 400' without device with SBA. Cues on not toe-in LLE. Pt neg ramp without device 3 reps with 1 LOB self correcting.    07/14/2022: BP 127/81 HR 90 SpO2 99% She went to bathroom and threw up yellow bile.   She took potassium & lasix which she has not taken in 2 days.  PT continued to monitor with pulse oximeter. With seated rest her HR continued to increase up to 104  bpm.  CMA for Dr. Ophelia Charter came to PT session.  Patient reports she felt better after throwing up but chest still did not feel right. Denies heaviness or pain above waist.  PT & CMA recommended immediately going to ED to be checked out.  PT escorted her to car & husband/boyfriend verbalized would take her 3 blocks to ED at [facility] per our recommendations.     HOME EXERCISE PROGRAM: Access Code: [email or web address] URL: [email or web address] Date: 06/09/2022 Prepared by: Vladimir Faster  Exercises - seated marching  - 2 x daily - 7 x weekly - 3 sets - 10 reps - 5 seconds hold - Seated Hip Abduction  - 2 x daily - 7 x weekly - 3 sets - 10 reps - 5 seconds hold - Seated Hamstring Stretch  - 2 x daily - 7 x weekly - 1 sets - 3 reps - 20-30 seconds hold - Seated Ankle Pumps  - 2 x daily - 7 x weekly - 3 sets - 10 reps - 5 seconds hold - Seated Ankle Circles  - 2 x daily - 7 x weekly - 3 sets - 10 reps - Seated Leg Press with Resistance  - 1 x daily - 7 x weekly - 2 sets - 10 reps - 5 seconds hold - Seated Ankle  Plantarflexion with Resistance  - 1 x daily - 7 x weekly - 2 sets - 10 reps - 5 seconds hold - Ankle Dorsiflexion with Resistance  - 1 x daily - 7 x weekly - 2 sets - 10 reps - 5 seconds hold - Seated Figure 4 Ankle Inversion with Resistance  - 1 x daily - 7 x weekly - 2 sets - 10 reps - 5 seconds hold - Seated Ankle Eversion with Anchored Resistance  - 1 x daily - 7 x weekly - 1 sets - 10 reps - 5 seconds hold - Standing Gastroc Stretch on Step  - 1 x daily - 7 x weekly - 1 sets - 2-3 reps - 30 seconds hold - Standing Soleus Stretch on Step  - 1 x daily - 7 x weekly - 1 sets - 2-3 reps - 30 seconds hold  ASSESSMENT: CLINICAL IMPRESSION: Patient continues to improve strength and balance with progressive PT activities. She appears on target to meet LTGs by end of POC.   OBJECTIVE IMPAIRMENTS: Abnormal gait, cardiopulmonary status limiting activity, decreased activity tolerance,  decreased balance, decreased coordination, decreased endurance, decreased knowledge of condition, decreased knowledge of use of DME, decreased mobility, difficulty walking, decreased ROM, decreased strength, increased edema, increased muscle spasms, impaired flexibility, impaired sensation, postural dysfunction, and pain.   ACTIVITY LIMITATIONS: carrying, lifting, bending, standing, squatting, sleeping, stairs, transfers, reach over head, and locomotion level  PARTICIPATION LIMITATIONS: meal prep, cleaning, driving, and community activity  PERSONAL FACTORS: Fitness, Time since onset of injury/illness/exacerbation, and 3+ comorbidities: see PMH  are also affecting patient's functional outcome.   REHAB POTENTIAL: Good  CLINICAL DECISION MAKING: Stable/uncomplicated  EVALUATION COMPLEXITY: Low   GOALS: Goals reviewed with patient? Yes  SHORT TERM GOALS: (target date for Short term goals are 30 days 07/16/2022)   1.  Patient will demonstrate independent use of updated home exercise program to maintain progress from in clinic treatments.  Goal status: MET 07/12/2022  2. Patient amb 200' & neg ramps/curbs without device with supervision.  Goal Status: MET 07/12/2022  LONG TERM GOALS: (target dates for all long term goals are 90 days 08/13/2022)   1. Patient will demonstrate/report pain at worst less than or equal to 2/10 to facilitate minimal limitation in daily activity secondary to pain symptoms. Goal status: Ongoing 07/07/2022   2. Patient will demonstrate independent use of home exercise program to facilitate ability to maintain/progress functional gains from skilled physical therapy services. Goal status: Ongoing 07/07/2022   3. Patient will demonstrate FOTO outcome > or = 47 % to indicate reduced disability due to condition.  Goal status: MET 06/30/2022   4.  Patient will demonstrate LLE MMT 5/5 throughout to faciltiate usual transfers, stairs, squatting at [facility] for daily life.    Goal status: Ongoing 07/07/2022   5.  Patient ambulates >500' with LRAD and neg ramp, curb & stairs single rail modified independent.  Goal status: New   6.  AROM of Left knee [facility] & left ankle within 75% of right ankle Goal status: Ongoing 07/07/2022   PLAN:  PT FREQUENCY: 2-3 x/week  PT DURATION: 13 weeks (90 days)  PLANNED INTERVENTIONS: Therapeutic exercises, Therapeutic activity, Neuro Muscular re-education, Balance training, Gait training, Patient/Family education, Joint mobilization, Stair training, DME instructions, Dry Needling, Electrical stimulation, Traction, Cryotherapy, vasopneumatic deviceMoist heat, Taping, Ultrasound, Ionotophoresis 4mg /ml Dexamethasone, and Manual therapy.  All included unless contraindicated  PLAN FOR NEXT SESSION: add LE theraband kicks to HEP,  continue work towards LTGs,  work on gait & balance without device, Cont progressing there exer & therapeutic activities to tolerance including balance activities   Vladimir Faster, PT, DPT 07/21/2022, 8:44 AM

## 2022-07-26 ENCOUNTER — Ambulatory Visit: Payer: Medicare PPO | Admitting: Physical Therapy

## 2022-07-26 ENCOUNTER — Encounter: Payer: Self-pay | Admitting: Physical Therapy

## 2022-07-26 DIAGNOSIS — M6281 Muscle weakness (generalized): Secondary | ICD-10-CM

## 2022-07-26 DIAGNOSIS — M25562 Pain in left knee: Secondary | ICD-10-CM | POA: Diagnosis not present

## 2022-07-26 DIAGNOSIS — M25572 Pain in left ankle and joints of left foot: Secondary | ICD-10-CM

## 2022-07-26 DIAGNOSIS — R2689 Other abnormalities of gait and mobility: Secondary | ICD-10-CM

## 2022-07-26 DIAGNOSIS — M25672 Stiffness of left ankle, not elsewhere classified: Secondary | ICD-10-CM

## 2022-07-26 DIAGNOSIS — R2681 Unsteadiness on feet: Secondary | ICD-10-CM

## 2022-07-26 NOTE — Therapy (Signed)
OUTPATIENT PHYSICAL THERAPY LOWER EXTREMITY TREATMENT   Patient Name: TALONA THRESHER MRN: [id number] DOB:03/28/1955, 67 y.o., female Today's Date: 07/26/2022   END OF SESSION:  PT End of Session - 07/26/22 0804     Visit Number 19    Number of Visits 30    Date for PT Re-Evaluation 08/13/22    Authorization Type [employer] PPO    Authorization Time Period $20 COPAY Approved Authorization #[id number]  Tracking #[id number] 4/15/-5/24    Authorization - Visit Number 7    Authorization - Number of Visits 12    Progress Note Due on Visit 20    PT Start Time 0801    PT Stop Time 0844    PT Time Calculation (min) 43 min    Equipment Utilized During Treatment Gait belt    Activity Tolerance Patient tolerated treatment well    Behavior During Therapy WFL for tasks assessed/performed                       Past Medical History:  Diagnosis Date   Alcoholic cirrhosis (HCC)    Chronic pancreatitis (HCC)    Diabetes mellitus without complication (HCC)    Endometriosis    GERD (gastroesophageal reflux disease)    Intractable nausea and vomiting 06/13/2020   Liver disease    Malabsorption    MALABSORPTION SYNDROME   Osteoporosis 03/2011   t score -2.5   Pancreatitis    due to cyst and tumors due to calcifications   Seizure (HCC)    no seizure disorder, controlled w/ meds per patient   Vitamin D deficiency 2012   VIT D 15   Past Surgical History:  Procedure Laterality Date   APPENDECTOMY     BIOPSY  01/19/2022   Procedure: BIOPSY;  Surgeon: Shellia Cleverly, DO;  Location: [facility];  Service: Gastroenterology;;   CHOLECYSTECTOMY     ESOPHAGOGASTRODUODENOSCOPY (EGD) WITH PROPOFOL N/A 01/19/2022   Procedure: ESOPHAGOGASTRODUODENOSCOPY (EGD) WITH PROPOFOL;  Surgeon: Shellia Cleverly, DO;  Location: [facility];  Service: Gastroenterology;  Laterality: N/A;   FEEDING TUBE RELOCATION  2010   JEJUNOSTOMY FEEDING TUBE  1990   MULTIPLE GASTRIC SURGERIES      MYOMECTOMY     OPEN REDUCTION INTERNAL FIXATION (ORIF) DISTAL RADIAL FRACTURE Left 06/29/2018   Procedure: OPEN REDUCTION INTERNAL FIXATION (ORIF)LEFT  DISTAL RADIAL FRACTURE;  Surgeon: Betha Loa, MD;  Location: [facility];  Service: Orthopedics;  Laterality: Left;   ROUX-EN-Y GASTRIC BYPASS     TIBIA IM NAIL INSERTION Left 04/21/2022   Procedure: LEFT INTRAMEDULLARY (IM) NAIL TIBIAL;  Surgeon: Eldred Manges, MD;  Location: [facility];  Service: Orthopedics;  Laterality: Left;   TIBIA IM NAIL INSERTION Left 05/05/2022   Procedure: LEFT TIBIAL NAIL DISTAL SCREW PLACEMENT;  Surgeon: Eldred Manges, MD;  Location: [facility];  Service: Orthopedics;  Laterality: Left;   TUBAL LIGATION     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   Palliative care patient 06/02/2022   History of gastric ulcer 03/23/2022   Abnormal loss of weight 03/23/2022   Acute on chronic diastolic CHF (congestive heart failure) (HCC) 03/23/2022   Cellulitis of left lower extremity 03/17/2022   Anemia of chronic disease 03/17/2022   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  Abnormal CT of the abdomen 01/19/2022   Chronic gastric ulcer with perforation (HCC) 01/19/2022   Alcoholic cirrhosis of liver without ascites (HCC) 01/18/2022   Occult blood in stools 01/18/2022   Portal hypertensive gastropathy (HCC) 01/18/2022   Hypoalbuminemia 01/11/2022   Physical deconditioning 01/09/2022   Pressure injury of skin 01/08/2022   Hypokalemia 01/07/2022   Severe protein-calorie malnutrition (HCC) 01/07/2022   Normocytic anemia 01/07/2022   Osteomyelitis (HCC) 01/06/2022   Seizure (HCC) 05/08/2017   Chronic pain syndrome 05/08/2017   Gastroesophageal reflux disease without esophagitis 05/08/2017   Chronic alcoholic  pancreatitis (HCC) 05/08/2017   Osteoporosis 03/16/2011   Malabsorption     PCP: General PCP   REFERRING PROVIDER:  Eldred Manges, MD   REFERRING DIAG:  S82.252A (ICD-10-CM) - Displaced comminuted fracture of shaft of left tibia, initial encounter for closed fracture  S82.832A (ICD-10-CM) - Closed fracture of proximal end of left fibula, initial encounter    THERAPY DIAG:  Muscle weakness (generalized)  Acute pain of left knee  Pain in left ankle and joints of left foot  Stiffness of left ankle, not elsewhere classified  Other abnormalities of gait and mobility  Unsteadiness on feet  Rationale for Evaluation and Treatment: Rehabilitation  ONSET DATE: 04/21/2022, 1st ankle sg, 05/05/2022 2nd surgery  SUBJECTIVE:   SUBJECTIVE STATEMENT:  She has been gardening using bench as limited with squating. She can do some shoveling. She can walk some areas without cane now. She was also able to take trash to cans & cans to road. The wounds are almost healed.  PERTINENT HISTORY: DM, Alcoholism in remission, chronic pancreatitis, hepatic cirrhosis, She had prolonged hospitalization 03/13/2022-1/29/204 with severe sepsis included hypoxic respiratory failure.   PAIN: NPRS scale: today left knee 0-1/10 & ankle 0-1/10  Pain location: left knee more patella & ankle more medially Pain description: ache,  Aggravating factors:  meds wearing off, over night with increased in morning Relieving factors: meds, elevating & not moving knee / ankle  PRECAUTIONS: Fall  WEIGHT BEARING RESTRICTIONS: Yes LLE WBAT as of 05/18/2022  FALLS:  Has patient fallen in last 6 months? Yes. Number of falls 1  LIVING ENVIRONMENT: Lives with: lives with their spouse and 3 dogs 51, 26 & 50# Lives in: House single story  Stairs: Yes: External: 6 (long width)  steps; on right going up temporary ramp primary entrance Has following equipment at home: Dan Humphreys - 2 wheeled, Wheelchair (manual), Shower bench, bed side  commode, Grab bars, and Ramped entry  OCCUPATION: retired   PLOF: Independent  PATIENT GOALS:   get back to function in house & access community  Next MD visit:  05/18/2022  OBJECTIVE:  DIAGNOSTIC FINDINGS: 05/11/2022: Post distal tibial screw placement.  AP knee AP ankle appear to be in good rotational alignment.   PATIENT SURVEYS:  06/30/2022: FOTO 49% 05/17/2022 / Evaluation:  FOTO intake:  34%  predicted:  47%  COGNITION: 05/17/2022 / Evaluation:   Overall cognitive status: WFL    SENSATION: 05/17/2022 / Evaluation:   WFL for knee area but unable to test ankle as wrapped for wound  EDEMA:  05/17/2022 / Evaluation:  left foot & ankle appear edematous but wrapped due to wound.  POSTURE:  05/17/2022 / Evaluation:  rounded shoulders, forward head, increased thoracic kyphosis, flexed trunk , and weight shift right  PALPATION: 05/17/2022 / Evaluation: not tested due to wrapped for wound  LOWER EXTREMITY ROM:   ROM Left Eval 05/17/22 Left 05/26/22 Left 06/16/22  Hip  flexion     Hip extension     Hip abduction     Hip adduction     Hip internal rotation     Hip external rotation     Knee flexion     Knee extension Seated A: LAQ -22* Seated  A: LAQ -11* Seated  A: LAQ -9*  Ankle dorsiflexion  Seated knee flexed A: -3* P: +2*  Seated knee flexed A: +3* P: +6*  Ankle plantarflexion  P & A 19* P & A 21*  Ankle inversion     Ankle eversion     Ankle Not tested at evaluation due to wound with bandages.  (Blank rows = not tested)  LOWER EXTREMITY MMT:  MMT Right eval Left eval  Hip flexion    Hip extension    Hip abduction    Hip adduction    Hip internal rotation    Hip external rotation    Knee flexion  3-/5  Knee extension  3-/5  Ankle dorsiflexion    Ankle plantarflexion    Ankle inversion    Ankle eversion    Ankle Not tested at evaluation due to Toe touch Weightbearing status.  (Blank rows = not tested)   FUNCTIONAL TESTS:  06/30/2022: Timed Up & Go: with cane  12.90 sec & without AD 11.53 sec  05/17/2022 / Evaluation: 18 inch chair transfer:  requires armrests or RW to push up to arise.  Requires RW support for stabilization  GAIT: 07/21/2022: Pt amb with cane community modified independent. Pt amb on level surfaces without device with verbal cues only for proper LLE rotation.   07/07/2022: pt amb 200' without device with supervision. She has LLE toe-in / internal rotation of hip but can correct with verbal cues.   06/28/2022: Pt amb 200' with cane stand alone tip with supervision on flat indoor surfaces. Gait velocity 2.53 ft/sec   05/17/2022 / Evaluation: Distance walked: 30' Assistive device utilized: Environmental consultant - 2 wheeled Level of assistance: SBA Comments: NWB LLE.  Turns with pivoting on foot incorrectly.    TODAY'S TREATMENT                                                                          DATE: 07/26/2022: Therapeutic Exercise: Leg Press BLEs 112# 15 reps 2 sets;  LLE only 50# 10 reps 2 sets Step up, over & step down with 2 step lead BUE hovering over //bars on BOSU round side up BLEs 10 reps. PT demo & verbal cues on technique Stepping LLE down onto compliant surface: standing on 10" step onto BOSU round side up 10 reps. PT noted Left ankle stabilizing lateral roll.  BAPs level 1 standing circles CW & CCW 10 reps ea with 2 rods 1/2# ea medial slots to assist eversion.  Standing on foam pad PF reaching overhead 15 reps 1st set, then moving UEs right & left during PF for inversion / eversion 15 reps ea Gastroc stretch on step heel depression 30 sec hold 2 reps Soleus stretch on step heel depression 30 sec hold 2 reps Seated knee flexed & standing knee extended LLE hip external rotation green theraband 15 reps ea. Seated ankle eversion green theraband 15 reps Standing green  theraband kicks flexion, abduction, extension, adduction 10 reps BLEs without UE support with minA/CGA for balance. PT updated HEP with HO & verbal cues. Pt  verbalized understanding.    Neuromuscular Re-education: Braiding, tandem & backwards gait with supervision / CGA. Outside //bars today. Sidestepping on foam including transitioning on/off foam surfaces 3 laps with no touch required.   Gait Training: Pt arrived / exited PT amb with cane with general supervision.  Pt amb 400' without device with SBA. Cues on not toe-in LLE. Pt neg ramp without device 3 reps with 1 LOB self correcting.   07/21/2022: Therapeutic Exercise: Leg Press BLEs 106# 15 reps 2 sets;  LLE only 43# 10 reps 2 sets Step up, over & step down with 2 step lead BUE hovering over //bars on BOSU round side up BLEs 10 reps. PT demo & verbal cues on technique Standing green theraband kicks flexion, abduction, extension, adduction 10 reps BLEs without UE support with minA/CGA for balance. BAPs level 1 standing circles CW & CCW 10 reps ea with 2 rods 1/2# ea medial slots to assist eversion.  Standing on foam pad PF reaching overhead 15 reps 1st set, then moving UEs right & left during PF for inversion / eversion 15 reps ea Gastroc stretch on step heel depression 30 sec hold 2 reps Soleus stretch on step heel depression 30 sec hold 2 reps Seated knee flexed & standing knee extended LLE hip external rotation red theraband 15 reps ea. Seated ankle eversion red theraband 15 reps Precor recumbent bike seat 6 with BLEs level 4 for 5 min   Neuromuscular Re-education: Braiding, tandem & backwards gait with supervision / CGA. Outside //bars today. Side stepping up/down ramp to left & to right with supervision.  Working on controlling inversion / eversion. Sidestepping on foam including transitioning on/off foam surfaces 3 laps with no touch required.   Gait Training: Pt arrived / exited PT amb with cane with general supervision.  Pt amb 400' without device with SBA. Cues on not toe-in LLE. Pt neg ramp without device 3 reps with 1 LOB self correcting.   07/19/2022: Therapeutic  Exercise: Precor recumbent bike seat 6 with BLEs level 4 for 8 min Step up, over & step down with 2 step lead BUE hovering over //bars on BOSU round side up BLEs 10 reps. PT demo & verbal cues on technique Standing red theraband kicks flexion, abduction, extension, adduction 10 reps BLEs without UE support with minA/CGA for balance. BAPs level 1 standing circles CW & CCW with minA for eversion initially. Standing on foam pad PF reaching overhead 15 reps 1st set, then moving UEs right & left during PF for inversion / eversion 15 reps ea Gastroc stretch on step heel depression 30 sec hold 2 reps Soleus stretch on step heel depression 30 sec hold 2 reps Seated knee flexed & standing knee extended LLE hip external rotation red theraband 15 reps. Seated ankle eversion red theraband 15 reps Leg press BLEs 100# 15 reps 2 sets, LLE only 37# 15 reps 2 sets  Neuromuscular Re-education: Braiding, tandem & backwards gait with supervision / CGA. Outside //bars today. Side stepping up/down ramp to left & to right with supervision.  Working on controlling inversion / eversion.  Gait Training: Pt arrived / exited PT amb with cane with general supervision.  Pt amb 400' without device with SBA. Cues on not toe-in LLE. Pt neg ramp without device 3 reps with 1 LOB self correcting.     HOME  EXERCISE PROGRAM: Access Code: [email or web address] URL: [email or web address] Date: 07/26/2022 Prepared by: Vladimir Faster  Exercises - Seated Figure 4 Ankle Inversion with Resistance  - 1 x daily - 7 x weekly - 2 sets - 10 reps - 5 seconds hold - Seated Ankle Eversion with Anchored Resistance  - 1 x daily - 7 x weekly - 1 sets - 10 reps - 5 seconds hold - Standing Gastroc Stretch on Step  - 1 x daily - 7 x weekly - 1 sets - 2-3 reps - 30 seconds hold - Standing Soleus Stretch on Step  - 1 x daily - 7 x weekly - 1 sets - 2-3 reps - 30 seconds hold - Seated Hip External Rotation with Resistance  - 1 x daily - 7 x  weekly - 1 sets - 10 reps - 5 seconds hold - Standing Hip External Rotation with Resistance Under Foot  - 1 x daily - 7 x weekly - 1 sets - 10 reps - 5 seconds hold - Standing Hip Flexion with Resistance (Mirrored)  - 1 x daily - 7 x weekly - 1 sets - 10 reps - Standing Hip Adduction with Resistance (Mirrored)  - 1 x daily - 7 x weekly - 1 sets - 10 reps - Standing Hip Extension with Resistance  - 1 x daily - 7 x weekly - 1 sets - 10 reps - Standing Hip Abduction with Theraband Resistance  - 1 x daily - 7 x weekly - 1 sets - 10 reps  ASSESSMENT: CLINICAL IMPRESSION: Patient appears to understand updated HEP.  Patient continues to report able to do more activities without issues.  Her strength continues to improve.   OBJECTIVE IMPAIRMENTS: Abnormal gait, cardiopulmonary status limiting activity, decreased activity tolerance, decreased balance, decreased coordination, decreased endurance, decreased knowledge of condition, decreased knowledge of use of DME, decreased mobility, difficulty walking, decreased ROM, decreased strength, increased edema, increased muscle spasms, impaired flexibility, impaired sensation, postural dysfunction, and pain.   ACTIVITY LIMITATIONS: carrying, lifting, bending, standing, squatting, sleeping, stairs, transfers, reach over head, and locomotion level  PARTICIPATION LIMITATIONS: meal prep, cleaning, driving, and community activity  PERSONAL FACTORS: Fitness, Time since onset of injury/illness/exacerbation, and 3+ comorbidities: see PMH  are also affecting patient's functional outcome.   REHAB POTENTIAL: Good  CLINICAL DECISION MAKING: Stable/uncomplicated  EVALUATION COMPLEXITY: Low   GOALS: Goals reviewed with patient? Yes  SHORT TERM GOALS: (target date for Short term goals are 30 days 07/16/2022)   1.  Patient will demonstrate independent use of updated home exercise program to maintain progress from in clinic treatments.  Goal status: MET 07/12/2022  2.  Patient amb 200' & neg ramps/curbs without device with supervision.  Goal Status: MET 07/12/2022  LONG TERM GOALS: (target dates for all long term goals are 90 days 08/13/2022)   1. Patient will demonstrate/report pain at worst less than or equal to 2/10 to facilitate minimal limitation in daily activity secondary to pain symptoms. Goal status: Ongoing 07/07/2022   2. Patient will demonstrate independent use of home exercise program to facilitate ability to maintain/progress functional gains from skilled physical therapy services. Goal status: Ongoing 07/07/2022   3. Patient will demonstrate FOTO outcome > or = 47 % to indicate reduced disability due to condition.  Goal status: MET 06/30/2022   4.  Patient will demonstrate LLE MMT 5/5 throughout to faciltiate usual transfers, stairs, squatting at [facility] for daily life.   Goal status: Ongoing 07/07/2022   5.  Patient ambulates >500'  with LRAD and neg ramp, curb & stairs single rail modified independent.  Goal status: New   6.  AROM of Left knee [facility] & left ankle within 75% of right ankle Goal status: Ongoing 07/07/2022   PLAN:  PT FREQUENCY: 2-3 x/week  PT DURATION: 13 weeks (90 days)  PLANNED INTERVENTIONS: Therapeutic exercises, Therapeutic activity, Neuro Muscular re-education, Balance training, Gait training, Patient/Family education, Joint mobilization, Stair training, DME instructions, Dry Needling, Electrical stimulation, Traction, Cryotherapy, vasopneumatic deviceMoist heat, Taping, Ultrasound, Ionotophoresis 4mg /ml Dexamethasone, and Manual therapy.  All included unless contraindicated  PLAN FOR NEXT SESSION: do 10th visit progress note, do MMT of ankle,  continue work towards LTGs, work on gait & balance without device, Cont progressing there exer & therapeutic activities to tolerance including balance activities   Vladimir Faster, PT, DPT 07/26/2022, 9:16 AM

## 2022-07-28 ENCOUNTER — Encounter: Payer: Self-pay | Admitting: Physical Therapy

## 2022-07-28 ENCOUNTER — Ambulatory Visit: Payer: Medicare PPO | Admitting: Physical Therapy

## 2022-07-28 DIAGNOSIS — M25562 Pain in left knee: Secondary | ICD-10-CM | POA: Diagnosis not present

## 2022-07-28 DIAGNOSIS — R2681 Unsteadiness on feet: Secondary | ICD-10-CM

## 2022-07-28 DIAGNOSIS — R600 Localized edema: Secondary | ICD-10-CM

## 2022-07-28 DIAGNOSIS — M25572 Pain in left ankle and joints of left foot: Secondary | ICD-10-CM

## 2022-07-28 DIAGNOSIS — M25672 Stiffness of left ankle, not elsewhere classified: Secondary | ICD-10-CM

## 2022-07-28 DIAGNOSIS — M6281 Muscle weakness (generalized): Secondary | ICD-10-CM

## 2022-07-28 DIAGNOSIS — R2689 Other abnormalities of gait and mobility: Secondary | ICD-10-CM

## 2022-07-28 NOTE — Therapy (Signed)
OUTPATIENT PHYSICAL THERAPY LOWER EXTREMITY TREATMENT & PROGRESS NOTE   Patient Name: Carolyn [name] MRN: [id number] DOB:1955-07-26, 67 y.o., female Today's Date: 07/28/2022  Progress Note Reporting Period 06/24/2022 to 07/28/2022  See note below for Objective Data and Assessment of Progress/Goals.      END OF SESSION:  PT End of Session - 07/28/22 0759     Visit Number 20    Number of Visits 30    Date for PT Re-Evaluation 08/13/22    Authorization Type [facility] Medicare Choice PPO    Authorization Time Period $20 COPAY Approved Authorization #[id number]  Tracking #[id number] 4/15/-5/24    Authorization - Visit Number 8    Authorization - Number of Visits 12    Progress Note Due on Visit 20    PT Start Time 0759    PT Stop Time 0840    PT Time Calculation (min) 41 min    Equipment Utilized During Treatment Gait belt    Activity Tolerance Patient tolerated treatment well    Behavior During Therapy WFL for tasks assessed/performed                       Past Medical History:  Diagnosis Date   Alcoholic cirrhosis (HCC)    Chronic pancreatitis (HCC)    Diabetes mellitus without complication (HCC)    Endometriosis    GERD (gastroesophageal reflux disease)    Intractable nausea and vomiting 06/13/2020   Liver disease    Malabsorption    MALABSORPTION SYNDROME   Osteoporosis 03/2011   t score -2.5   Pancreatitis    due to cyst and tumors due to calcifications   Seizure (HCC)    no seizure disorder, controlled w/ meds per patient   Vitamin D deficiency 2012   VIT D 15   Past Surgical History:  Procedure Laterality Date   APPENDECTOMY     BIOPSY  01/19/2022   Procedure: BIOPSY;  Surgeon: Shellia Cleverly, DO;  Location: [facility];  Service: Gastroenterology;;   CHOLECYSTECTOMY     ESOPHAGOGASTRODUODENOSCOPY (EGD) WITH PROPOFOL N/A 01/19/2022   Procedure: ESOPHAGOGASTRODUODENOSCOPY (EGD) WITH PROPOFOL;  Surgeon: Shellia Cleverly, DO;  Location: [facility];  Service: Gastroenterology;  Laterality: N/A;   FEEDING TUBE RELOCATION  2010   JEJUNOSTOMY FEEDING TUBE  1990   MULTIPLE GASTRIC SURGERIES     MYOMECTOMY     OPEN REDUCTION INTERNAL FIXATION (ORIF) DISTAL RADIAL FRACTURE Left 06/29/2018   Procedure: OPEN REDUCTION INTERNAL FIXATION (ORIF)LEFT  DISTAL RADIAL FRACTURE;  Surgeon: Betha Loa, MD;  Location: [facility];  Service: Orthopedics;  Laterality: Left;   ROUX-EN-Y GASTRIC BYPASS     TIBIA IM NAIL INSERTION Left 04/21/2022   Procedure: LEFT INTRAMEDULLARY (IM) NAIL TIBIAL;  Surgeon: Eldred Manges, MD;  Location: [facility];  Service: Orthopedics;  Laterality: Left;   TIBIA IM NAIL INSERTION Left 05/05/2022   Procedure: LEFT TIBIAL NAIL DISTAL SCREW PLACEMENT;  Surgeon: Eldred Manges, MD;  Location: [facility];  Service: Orthopedics;  Laterality: Left;   TUBAL LIGATION     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   Palliative care patient 06/02/2022   History of gastric ulcer 03/23/2022   Abnormal loss of weight 03/23/2022   Acute on chronic diastolic CHF (congestive heart  failure) (HCC) 03/23/2022   Cellulitis of left lower extremity 03/17/2022   Anemia of chronic disease 03/17/2022   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   Abnormal CT of the abdomen 01/19/2022   Chronic gastric ulcer with perforation (HCC) 01/19/2022   Alcoholic cirrhosis of liver without ascites (HCC) 01/18/2022   Occult blood in stools 01/18/2022   Portal hypertensive gastropathy (HCC) 01/18/2022   Hypoalbuminemia 01/11/2022   Physical deconditioning 01/09/2022   Pressure injury of skin 01/08/2022   Hypokalemia 01/07/2022   Severe protein-calorie malnutrition (HCC) 01/07/2022   Normocytic anemia 01/07/2022   Osteomyelitis (HCC) 01/06/2022    Seizure (HCC) 05/08/2017   Chronic pain syndrome 05/08/2017   Gastroesophageal reflux disease without esophagitis 05/08/2017   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HCC) 05/08/2017   Osteoporosis 03/16/2011   Malabsorption     PCP: General PCP   REFERRING PROVIDER:  Eldred Manges, MD   REFERRING DIAG:  S82.252A (ICD-10-CM) - Displaced comminuted fracture of shaft of left tibia, initial encounter for closed fracture  S82.832A (ICD-10-CM) - Closed fracture of proximal end of left fibula, initial encounter    THERAPY DIAG:  Muscle weakness (generalized)  Acute pain of left knee  Pain in left ankle and joints of left foot  Stiffness of left ankle, not elsewhere classified  Other abnormalities of gait and mobility  Unsteadiness on feet  Localized edema  Rationale for Evaluation and Treatment: Rehabilitation  ONSET DATE: 04/21/2022, 1st ankle sg, 05/05/2022 2nd surgery  SUBJECTIVE:   SUBJECTIVE STATEMENT:  She has not tried new exercises yet because she has been busy with gardening.  She saw wound MD who thinks wounds at ankles are better.  Eye doctor says she has cataracts but she wants to wait on surgery so plans to get new glasses.  PERTINENT HISTORY: DM, Alcoholism in remission, chronic pancreatitis, hepatic cirrhosis, She had prolonged hospitalization 03/13/2022-1/29/204 with severe sepsis included hypoxic respiratory failure.   PAIN: NPRS scale: today left knee 0-1/10 & ankle 0-1/10  Pain location: left knee more patella & ankle more medially Pain description: ache,  Aggravating factors:  meds wearing off, over night with increased in morning Relieving factors: meds, elevating & not moving knee / ankle  PRECAUTIONS: Fall  WEIGHT BEARING RESTRICTIONS: Yes LLE WBAT as of 05/18/2022  FALLS:  Has patient fallen in last 6 months? Yes. Number of falls 1  LIVING ENVIRONMENT: Lives with: lives with their spouse and 3 dogs 15, 1 & 50# Lives in: House single story  Stairs:  Yes: External: 6 (long width)  steps; on right going up temporary ramp primary entrance Has following equipment at home: Dan Humphreys - 2 wheeled, Wheelchair (manual), Shower bench, bed side commode, Grab bars, and Ramped entry  OCCUPATION: retired   PLOF: Independent  PATIENT GOALS:   get back to function in house & access community  Next MD visit:  05/18/2022  OBJECTIVE:  DIAGNOSTIC FINDINGS: 05/11/2022: Post distal tibial screw placement.  AP knee AP ankle appear to be in good rotational alignment.   PATIENT SURVEYS:  06/30/2022: FOTO 49% 05/17/2022 / Evaluation:  FOTO intake:  34%  predicted:  47%  COGNITION: 05/17/2022 / Evaluation:   Overall cognitive status: WFL    SENSATION: 05/17/2022 / Evaluation:   WFL for knee area but unable to test ankle as wrapped for wound  EDEMA:  05/17/2022 / Evaluation:  left foot & ankle appear edematous but wrapped due to wound.  POSTURE:  05/17/2022 / Evaluation:  rounded shoulders, forward head, increased thoracic kyphosis, flexed trunk ,  and weight shift right  PALPATION: 05/17/2022 / Evaluation: not tested due to wrapped for wound  LOWER EXTREMITY ROM:   ROM Left Eval 05/17/22 Left 05/26/22 Left 06/16/22 Left 07/28/22  Hip flexion      Hip extension      Hip abduction      Hip adduction      Hip internal rotation      Hip external rotation      Knee flexion      Knee extension Seated A: LAQ -22* Seated  A: LAQ -11* Seated  A: LAQ -9* Seated A: LAQ -2*  Ankle dorsiflexion  Seated knee flexed A: -3* P: +2*  Seated knee flexed A: +3* P: +6* Seated knee flexed A: 11*  Ankle plantarflexion  P & A 19* P & A 21* P & A 28*  Ankle inversion      Ankle eversion      Ankle Not tested at evaluation due to wound with bandages.  (Blank rows = not tested)  LOWER EXTREMITY MMT:  MMT Left eval Left 07/28/22  Hip flexion    Hip extension    Hip abduction    Hip adduction    Hip internal rotation    Hip external rotation    Knee flexion 3-/5 4/5   Knee extension 3-/5 4/5  Ankle dorsiflexion  4/5  Ankle plantarflexion  3+/5 Standing 3 full range   Ankle inversion  4/5  Ankle eversion  4-/5  05/17/2022 / Evaluation:  Ankle Not tested at evaluation due to Toe touch Weightbearing status.  (Blank rows = not tested)   FUNCTIONAL TESTS:  06/30/2022: Timed Up & Go: with cane 12.90 sec & without AD 11.53 sec  05/17/2022 / Evaluation: 18 inch chair transfer:  requires armrests or RW to push up to arise.  Requires RW support for stabilization  GAIT: 07/21/2022: Pt amb with cane community modified independent. Pt amb on level surfaces without device with verbal cues only for proper LLE rotation.   07/07/2022: pt amb 200' without device with supervision. She has LLE toe-in / internal rotation of hip but can correct with verbal cues.   06/28/2022: Pt amb 200' with cane stand alone tip with supervision on flat indoor surfaces. Gait velocity 2.53 ft/sec   05/17/2022 / Evaluation: Distance walked: 30' Assistive device utilized: Environmental consultant - 2 wheeled Level of assistance: SBA Comments: NWB LLE.  Turns with pivoting on foot incorrectly.    TODAY'S TREATMENT                                                                          DATE: 07/28/2022: Therapeutic Exercise: Leg Press BLEs 112# 15 reps 2 sets;  LLE only 50# 10 reps 2 sets See objective for ROM & MMT Standing touch on mirror for balance: PF heel raises concentric BLEs eccentric LLE 10 reps 2 sets Step up, over & step down with 2 step lead BUE hovering over //bars on BOSU round side up BLEs 10 reps. PT demo & verbal cues on technique Seated knee flexed & standing knee extended LLE hip external rotation green theraband 15 reps ea. Seated ankle eversion green theraband 15 reps Recumbent bike seat 6 level 5 for 5 min.  Neuromuscular Re-education: Braiding, tandem & backwards gait with supervision  Sidestepping on foam including transitioning on/off foam surfaces 3 laps with no touch  required.     07/26/2022: Therapeutic Exercise: Leg Press BLEs 112# 15 reps 2 sets;  LLE only 50# 10 reps 2 sets Step up, over & step down with 2 step lead BUE hovering over //bars on BOSU round side up BLEs 10 reps. PT demo & verbal cues on technique Stepping LLE down onto compliant surface: standing on 10" step onto BOSU round side up 10 reps. PT noted Left ankle stabilizing lateral roll.  BAPs level 1 standing circles CW & CCW 10 reps ea with 2 rods 1/2# ea medial slots to assist eversion.  Standing on foam pad PF reaching overhead 15 reps 1st set, then moving UEs right & left during PF for inversion / eversion 15 reps ea Gastroc stretch on step heel depression 30 sec hold 2 reps Soleus stretch on step heel depression 30 sec hold 2 reps Seated knee flexed & standing knee extended LLE hip external rotation green theraband 15 reps ea. Seated ankle eversion green theraband 15 reps Standing green theraband kicks flexion, abduction, extension, adduction 10 reps BLEs without UE support with minA/CGA for balance. PT updated HEP with HO & verbal cues. Pt verbalized understanding.    Neuromuscular Re-education: Braiding, tandem & backwards gait with supervision / CGA. Outside //bars today. Sidestepping on foam including transitioning on/off foam surfaces 3 laps with no touch required.   Gait Training: Pt arrived / exited PT amb with cane with general supervision.  Pt amb 400' without device with SBA. Cues on not toe-in LLE. Pt neg ramp without device 3 reps with 1 LOB self correcting.   07/21/2022: Therapeutic Exercise: Leg Press BLEs 106# 15 reps 2 sets;  LLE only 43# 10 reps 2 sets Step up, over & step down with 2 step lead BUE hovering over //bars on BOSU round side up BLEs 10 reps. PT demo & verbal cues on technique Standing green theraband kicks flexion, abduction, extension, adduction 10 reps BLEs without UE support with minA/CGA for balance. BAPs level 1 standing circles CW & CCW 10  reps ea with 2 rods 1/2# ea medial slots to assist eversion.  Standing on foam pad PF reaching overhead 15 reps 1st set, then moving UEs right & left during PF for inversion / eversion 15 reps ea Gastroc stretch on step heel depression 30 sec hold 2 reps Soleus stretch on step heel depression 30 sec hold 2 reps Seated knee flexed & standing knee extended LLE hip external rotation red theraband 15 reps ea. Seated ankle eversion red theraband 15 reps Precor recumbent bike seat 6 with BLEs level 4 for 5 min   Neuromuscular Re-education: Braiding, tandem & backwards gait with supervision / CGA. Outside //bars today. Side stepping up/down ramp to left & to right with supervision.  Working on controlling inversion / eversion. Sidestepping on foam including transitioning on/off foam surfaces 3 laps with no touch required.   Gait Training: Pt arrived / exited PT amb with cane with general supervision.  Pt amb 400' without device with SBA. Cues on not toe-in LLE. Pt neg ramp without device 3 reps with 1 LOB self correcting.     HOME EXERCISE PROGRAM: Access Code: [email or web address] URL: [email or web address] Date: 07/26/2022 Prepared by: Vladimir Faster  Exercises - Seated Figure 4 Ankle Inversion with Resistance  - 1 x daily - 7 x weekly - 2 sets - 10 reps -  5 seconds hold - Seated Ankle Eversion with Anchored Resistance  - 1 x daily - 7 x weekly - 1 sets - 10 reps - 5 seconds hold - Standing Gastroc Stretch on Step  - 1 x daily - 7 x weekly - 1 sets - 2-3 reps - 30 seconds hold - Standing Soleus Stretch on Step  - 1 x daily - 7 x weekly - 1 sets - 2-3 reps - 30 seconds hold - Seated Hip External Rotation with Resistance  - 1 x daily - 7 x weekly - 1 sets - 10 reps - 5 seconds hold - Standing Hip External Rotation with Resistance Under Foot  - 1 x daily - 7 x weekly - 1 sets - 10 reps - 5 seconds hold - Standing Hip Flexion with Resistance (Mirrored)  - 1 x daily - 7 x weekly - 1 sets - 10  reps - Standing Hip Adduction with Resistance (Mirrored)  - 1 x daily - 7 x weekly - 1 sets - 10 reps - Standing Hip Extension with Resistance  - 1 x daily - 7 x weekly - 1 sets - 10 reps - Standing Hip Abduction with Theraband Resistance  - 1 x daily - 7 x weekly - 1 sets - 10 reps  ASSESSMENT: CLINICAL IMPRESSION: Patient has made significant improvements in strength, balance & function over last 10 PT visits.   Patient reports able to do more activities including gardening without ankle issues. See objective data for increased range & strength measurements.  Patient continues to benefit from skilled PT.   OBJECTIVE IMPAIRMENTS: Abnormal gait, cardiopulmonary status limiting activity, decreased activity tolerance, decreased balance, decreased coordination, decreased endurance, decreased knowledge of condition, decreased knowledge of use of DME, decreased mobility, difficulty walking, decreased ROM, decreased strength, increased edema, increased muscle spasms, impaired flexibility, impaired sensation, postural dysfunction, and pain.   ACTIVITY LIMITATIONS: carrying, lifting, bending, standing, squatting, sleeping, stairs, transfers, reach over head, and locomotion level  PARTICIPATION LIMITATIONS: meal prep, cleaning, driving, and community activity  PERSONAL FACTORS: Fitness, Time since onset of injury/illness/exacerbation, and 3+ comorbidities: see PMH  are also affecting patient's functional outcome.   REHAB POTENTIAL: Good  CLINICAL DECISION MAKING: Stable/uncomplicated  EVALUATION COMPLEXITY: Low   GOALS: Goals reviewed with patient? Yes  SHORT TERM GOALS: (target date for Short term goals are 30 days 07/16/2022)   1.  Patient will demonstrate independent use of updated home exercise program to maintain progress from in clinic treatments.  Goal status: MET 07/12/2022  2. Patient amb 200' & neg ramps/curbs without device with supervision.  Goal Status: MET 07/12/2022  LONG TERM  GOALS: (target dates for all long term goals are 90 days 08/13/2022)   1. Patient will demonstrate/report pain at worst less than or equal to 2/10 to facilitate minimal limitation in daily activity secondary to pain symptoms. Goal status: Ongoing 07/07/2022   2. Patient will demonstrate independent use of home exercise program to facilitate ability to maintain/progress functional gains from skilled physical therapy services. Goal status: Ongoing 07/07/2022   3. Patient will demonstrate FOTO outcome > or = 47 % to indicate reduced disability due to condition.  Goal status: MET 06/30/2022   4.  Patient will demonstrate LLE MMT 5/5 throughout to faciltiate usual transfers, stairs, squatting at [facility] for daily life.   Goal status: Ongoing 07/07/2022   5.  Patient ambulates >500' with LRAD and neg ramp, curb & stairs single rail modified independent.  Goal status: New  6.  AROM of Left knee [facility] & left ankle within 75% of right ankle Goal status: Ongoing 07/07/2022   PLAN:  PT FREQUENCY: 2-3 x/week  PT DURATION: 13 weeks (90 days)  PLANNED INTERVENTIONS: Therapeutic exercises, Therapeutic activity, Neuro Muscular re-education, Balance training, Gait training, Patient/Family education, Joint mobilization, Stair training, DME instructions, Dry Needling, Electrical stimulation, Traction, Cryotherapy, vasopneumatic deviceMoist heat, Taping, Ultrasound, Ionotophoresis 4mg /ml Dexamethasone, and Manual therapy.  All included unless contraindicated  PLAN FOR NEXT SESSION:  continue work towards LTGs, work on gait & balance without device, Cont progressing there exer & therapeutic activities to tolerance including balance activities   Vladimir Faster, PT, DPT 07/28/2022, 10:02 AM

## 2022-07-29 ENCOUNTER — Ambulatory Visit: Payer: Medicare PPO | Admitting: Orthopedic Surgery

## 2022-07-30 ENCOUNTER — Encounter (HOSPITAL_BASED_OUTPATIENT_CLINIC_OR_DEPARTMENT_OTHER): Payer: Medicare PPO | Admitting: General Surgery

## 2022-07-30 DIAGNOSIS — L89512 Pressure ulcer of right ankle, stage 2: Secondary | ICD-10-CM | POA: Diagnosis not present

## 2022-08-02 ENCOUNTER — Ambulatory Visit: Payer: Medicare PPO | Admitting: Physical Therapy

## 2022-08-02 ENCOUNTER — Encounter: Payer: Self-pay | Admitting: Physical Therapy

## 2022-08-02 DIAGNOSIS — R2689 Other abnormalities of gait and mobility: Secondary | ICD-10-CM

## 2022-08-02 DIAGNOSIS — M6281 Muscle weakness (generalized): Secondary | ICD-10-CM

## 2022-08-02 DIAGNOSIS — M25572 Pain in left ankle and joints of left foot: Secondary | ICD-10-CM | POA: Diagnosis not present

## 2022-08-02 DIAGNOSIS — M25672 Stiffness of left ankle, not elsewhere classified: Secondary | ICD-10-CM

## 2022-08-02 DIAGNOSIS — M25562 Pain in left knee: Secondary | ICD-10-CM

## 2022-08-02 DIAGNOSIS — R600 Localized edema: Secondary | ICD-10-CM

## 2022-08-02 DIAGNOSIS — R2681 Unsteadiness on feet: Secondary | ICD-10-CM

## 2022-08-02 NOTE — Therapy (Signed)
OUTPATIENT PHYSICAL THERAPY LOWER EXTREMITY TREATMENT   Patient Name: Carolyn [name] MRN: [id number] DOB:[birth year], 67 y.o., female Today's Date: 08/02/2022  END OF SESSION:  PT End of Session - 08/02/22 0759     Visit Number 21    Number of Visits 30    Date for PT Re-Evaluation 08/13/22    Authorization Type [employer] PPO    Authorization Time Period $20 COPAY Approved Authorization #[id number]  Tracking #[id number] 4/15/-5/24    Authorization - Number of Visits 12    Progress Note Due on Visit 20    PT Start Time 0759    PT Stop Time 0837    PT Time Calculation (min) 38 min    Equipment Utilized During Treatment Gait belt    Activity Tolerance Patient tolerated treatment well    Behavior During Therapy WFL for tasks assessed/performed                        Past Medical History:  Diagnosis Date   Alcoholic cirrhosis (HCC)    Chronic pancreatitis (HCC)    Diabetes mellitus without complication (HCC)    Endometriosis    GERD (gastroesophageal reflux disease)    Intractable nausea and vomiting 06/13/2020   Liver disease    Malabsorption    MALABSORPTION SYNDROME   Osteoporosis 03/2011   t score -2.5   Pancreatitis    due to cyst and tumors due to calcifications   Seizure (HCC)    no seizure disorder, controlled w/ meds per patient   Vitamin D deficiency 2012   VIT D 15   Past Surgical History:  Procedure Laterality Date   APPENDECTOMY     BIOPSY  01/19/2022   Procedure: BIOPSY;  Surgeon: Shellia Cleverly, DO;  Location: [facility];  Service: Gastroenterology;;   CHOLECYSTECTOMY     ESOPHAGOGASTRODUODENOSCOPY (EGD) WITH PROPOFOL N/A 01/19/2022   Procedure: ESOPHAGOGASTRODUODENOSCOPY (EGD) WITH PROPOFOL;  Surgeon: Shellia Cleverly, DO;  Location: [facility];  Service: Gastroenterology;  Laterality: N/A;   FEEDING TUBE RELOCATION  2010   JEJUNOSTOMY FEEDING TUBE  1990   MULTIPLE GASTRIC SURGERIES     MYOMECTOMY     OPEN REDUCTION  INTERNAL FIXATION (ORIF) DISTAL RADIAL FRACTURE Left 06/29/2018   Procedure: OPEN REDUCTION INTERNAL FIXATION (ORIF)LEFT  DISTAL RADIAL FRACTURE;  Surgeon: Betha Loa, MD;  Location: [facility];  Service: Orthopedics;  Laterality: Left;   ROUX-EN-[id number] GASTRIC BYPASS     TIBIA IM NAIL INSERTION Left 04/21/2022   Procedure: LEFT INTRAMEDULLARY (IM) NAIL TIBIAL;  Surgeon: Eldred Manges, MD;  Location: [facility];  Service: Orthopedics;  Laterality: Left;   TIBIA IM NAIL INSERTION Left 05/05/2022   Procedure: LEFT TIBIAL NAIL DISTAL SCREW PLACEMENT;  Surgeon: Eldred Manges, MD;  Location: [facility];  Service: Orthopedics;  Laterality: Left;   TUBAL LIGATION     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   Palliative care patient 06/02/2022   History of gastric ulcer 03/23/2022   Abnormal loss of weight 03/23/2022   Acute on chronic diastolic CHF (congestive heart failure) (HCC) 03/23/2022   Cellulitis of left lower extremity 03/17/2022   Anemia of chronic disease 03/17/2022   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   Abnormal CT of the abdomen 01/19/2022  Chronic gastric ulcer with perforation (HCC) 01/19/2022   Alcoholic cirrhosis of liver without ascites (HCC) 01/18/2022   Occult blood in stools 01/18/2022   Portal hypertensive gastropathy (HCC) 01/18/2022   Hypoalbuminemia 01/11/2022   Physical deconditioning 01/09/2022   Pressure injury of skin 01/08/2022   Hypokalemia 01/07/2022   Severe protein-calorie malnutrition (HCC) 01/07/2022   Normocytic anemia 01/07/2022   Osteomyelitis (HCC) 01/06/2022   Seizure (HCC) 05/08/2017   Chronic pain syndrome 05/08/2017   Gastroesophageal reflux disease without esophagitis 05/08/2017   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HCC) 05/08/2017    Osteoporosis 03/16/2011   Malabsorption     PCP: General PCP   REFERRING PROVIDER:  Eldred Manges, MD   REFERRING DIAG:  S82.252A (ICD-10-CM) - Displaced comminuted fracture of shaft of left tibia, initial encounter for closed fracture  S82.832A (ICD-10-CM) - Closed fracture of proximal end of left fibula, initial encounter    THERAPY DIAG:  Muscle weakness (generalized)  Acute pain of left knee  Pain in left ankle and joints of left foot  Stiffness of left ankle, not elsewhere classified  Other abnormalities of gait and mobility  Unsteadiness on feet  Localized edema  Rationale for Evaluation and Treatment: Rehabilitation  ONSET DATE: 04/21/2022, 1st ankle sg, 05/05/2022 2nd surgery  SUBJECTIVE:   SUBJECTIVE STATEMENT:  She got new glasses that are interfering with walking a little.  She is now able to carry items like laundry basket.   PERTINENT HISTORY: DM, Alcoholism in remission, chronic pancreatitis, hepatic cirrhosis, She had prolonged hospitalization 03/13/2022-1/29/204 with severe sepsis included hypoxic respiratory failure.   PAIN: NPRS scale: today left knee 0-1/10 & ankle 0-1/10  Pain location: left knee more patella & ankle more medially Pain description: ache,  Aggravating factors:  meds wearing off, over night with increased in morning Relieving factors: meds, elevating & not moving knee / ankle  PRECAUTIONS: Fall  WEIGHT BEARING RESTRICTIONS: Yes LLE WBAT as of 05/18/2022  FALLS:  Has patient fallen in last 6 months? Yes. Number of falls 1  LIVING ENVIRONMENT: Lives with: lives with their spouse and 3 dogs 39, 3 & 50# Lives in: House single story  Stairs: Yes: External: 6 (long width)  steps; on right going up temporary ramp primary entrance Has following equipment at home: Dan Humphreys - 2 wheeled, Wheelchair (manual), Shower bench, bed side commode, Grab bars, and Ramped entry  OCCUPATION: retired   PLOF: Independent  PATIENT GOALS:   get back to  function in house & access community  Next MD visit:  05/18/2022  OBJECTIVE:  DIAGNOSTIC FINDINGS: 05/11/2022: Post distal tibial screw placement.  AP knee AP ankle appear to be in good rotational alignment.   PATIENT SURVEYS:  06/30/2022: FOTO 49% 05/17/2022 / Evaluation:  FOTO intake:  34%  predicted:  47%  COGNITION: 05/17/2022 / Evaluation:   Overall cognitive status: WFL    SENSATION: 05/17/2022 / Evaluation:   WFL for knee area but unable to test ankle as wrapped for wound  EDEMA:  05/17/2022 / Evaluation:  left foot & ankle appear edematous but wrapped due to wound.  POSTURE:  05/17/2022 / Evaluation:  rounded shoulders, forward head, increased thoracic kyphosis, flexed trunk , and weight shift right  PALPATION: 05/17/2022 / Evaluation: not tested due to wrapped for wound  LOWER EXTREMITY ROM:   ROM Left Eval 05/17/22 Left 05/26/22 Left 06/16/22 Left 07/28/22  Hip flexion      Hip extension      Hip abduction      Hip  adduction      Hip internal rotation      Hip external rotation      Knee flexion      Knee extension Seated A: LAQ -22* Seated  A: LAQ -11* Seated  A: LAQ -9* Seated A: LAQ -2*  Ankle dorsiflexion  Seated knee flexed A: -3* P: +2*  Seated knee flexed A: +3* P: +6* Seated knee flexed A: 11*  Ankle plantarflexion  P & A 19* P & A 21* P & A 28*  Ankle inversion      Ankle eversion      Ankle Not tested at evaluation due to wound with bandages.  (Blank rows = not tested)  LOWER EXTREMITY MMT:  MMT Left eval Left 07/28/22  Hip flexion    Hip extension    Hip abduction    Hip adduction    Hip internal rotation    Hip external rotation    Knee flexion 3-/5 4/5  Knee extension 3-/5 4/5  Ankle dorsiflexion  4/5  Ankle plantarflexion  3+/5 Standing 3 full range   Ankle inversion  4/5  Ankle eversion  4-/5  05/17/2022 / Evaluation:  Ankle Not tested at evaluation due to Toe touch Weightbearing status.  (Blank rows = not tested)   FUNCTIONAL TESTS:   06/30/2022: Timed Up & Go: with cane 12.90 sec & without AD 11.53 sec  05/17/2022 / Evaluation: 18 inch chair transfer:  requires armrests or RW to push up to arise.  Requires RW support for stabilization  GAIT: 07/21/2022: Pt amb with cane community modified independent. Pt amb on level surfaces without device with verbal cues only for proper LLE rotation.   07/07/2022: pt amb 200' without device with supervision. She has LLE toe-in / internal rotation of hip but can correct with verbal cues.   06/28/2022: Pt amb 200' with cane stand alone tip with supervision on flat indoor surfaces. Gait velocity 2.53 ft/sec   05/17/2022 / Evaluation: Distance walked: 30' Assistive device utilized: Environmental consultant - 2 wheeled Level of assistance: SBA Comments: NWB LLE.  Turns with pivoting on foot incorrectly.    TODAY'S TREATMENT                                                                          DATE: 08/02/2022: Therapeutic Exercise: Leg Press BLEs 112# 15 reps 2 sets;  LLE only 50# 10 reps 2 sets Standing gastroc & soleus stretch on step heel depression 30 sec hold 2 reps Standing touch on mirror for balance: PF heel raises concentric BLEs eccentric LLE 10 reps 2 sets Step up, over & step down with 2 step lead BUE hovering over //bars on BOSU round side up BLEs 10 reps.  BAPs level 1 standing circles CW & CCW 10 reps ea with 2 rods 1/2# ea medial slots to assist eversion.  Seated knee flexed & standing knee extended LLE hip external rotation green theraband 15 reps ea. Seated ankle eversion green theraband 15 reps Knee extension machine 15# 10 reps 2 sets Knee flexion machine 20# 10 reps 2 sets    Neuromuscular Re-education: Braiding, forward tandem & backwards tandem with supervision 3 laps each Sidestepping on foam including transitioning on/off foam surfaces  3 laps with no touch required.  Heel walking & Toe walking 30' with supervision. Side stepping right & left on ramp with  supervision Walking 30' with eyes closed to simulate walking in dark with supervision   07/28/2022: Therapeutic Exercise: Leg Press BLEs 112# 15 reps 2 sets;  LLE only 50# 10 reps 2 sets See objective for ROM & MMT Standing touch on mirror for balance: PF heel raises concentric BLEs eccentric LLE 10 reps 2 sets Step up, over & step down with 2 step lead BUE hovering over //bars on BOSU round side up BLEs 10 reps. PT demo & verbal cues on technique Seated knee flexed & standing knee extended LLE hip external rotation green theraband 15 reps ea. Seated ankle eversion green theraband 15 reps Recumbent bike seat 6 level 5 for 5 min.    Neuromuscular Re-education: Braiding, tandem & backwards gait with supervision  Sidestepping on foam including transitioning on/off foam surfaces 3 laps with no touch required.     07/26/2022: Therapeutic Exercise: Leg Press BLEs 112# 15 reps 2 sets;  LLE only 50# 10 reps 2 sets Step up, over & step down with 2 step lead BUE hovering over //bars on BOSU round side up BLEs 10 reps. PT demo & verbal cues on technique Stepping LLE down onto compliant surface: standing on 10" step onto BOSU round side up 10 reps. PT noted Left ankle stabilizing lateral roll.  BAPs level 1 standing circles CW & CCW 10 reps ea with 2 rods 1/2# ea medial slots to assist eversion.  Standing on foam pad PF reaching overhead 15 reps 1st set, then moving UEs right & left during PF for inversion / eversion 15 reps ea Gastroc stretch on step heel depression 30 sec hold 2 reps Soleus stretch on step heel depression 30 sec hold 2 reps Seated knee flexed & standing knee extended LLE hip external rotation green theraband 15 reps ea. Seated ankle eversion green theraband 15 reps Standing green theraband kicks flexion, abduction, extension, adduction 10 reps BLEs without UE support with minA/CGA for balance. PT updated HEP with HO & verbal cues. Pt verbalized understanding.    Neuromuscular  Re-education: Braiding, tandem & backwards gait with supervision / CGA. Outside //bars today. Sidestepping on foam including transitioning on/off foam surfaces 3 laps with no touch required.   Gait Training: Pt arrived / exited PT amb with cane with general supervision.  Pt amb 400' without device with SBA. Cues on not toe-in LLE. Pt neg ramp without device 3 reps with 1 LOB self correcting.      HOME EXERCISE PROGRAM: Access Code: [email or web address] URL: [email or web address] Date: 07/26/2022 Prepared by: Vladimir Faster  Exercises - Seated Figure 4 Ankle Inversion with Resistance  - 1 x daily - 7 x weekly - 2 sets - 10 reps - 5 seconds hold - Seated Ankle Eversion with Anchored Resistance  - 1 x daily - 7 x weekly - 1 sets - 10 reps - 5 seconds hold - Standing Gastroc Stretch on Step  - 1 x daily - 7 x weekly - 1 sets - 2-3 reps - 30 seconds hold - Standing Soleus Stretch on Step  - 1 x daily - 7 x weekly - 1 sets - 2-3 reps - 30 seconds hold - Seated Hip External Rotation with Resistance  - 1 x daily - 7 x weekly - 1 sets - 10 reps - 5 seconds hold - Standing Hip External Rotation with Resistance Under Foot  -  1 x daily - 7 x weekly - 1 sets - 10 reps - 5 seconds hold - Standing Hip Flexion with Resistance (Mirrored)  - 1 x daily - 7 x weekly - 1 sets - 10 reps - Standing Hip Adduction with Resistance (Mirrored)  - 1 x daily - 7 x weekly - 1 sets - 10 reps - Standing Hip Extension with Resistance  - 1 x daily - 7 x weekly - 1 sets - 10 reps - Standing Hip Abduction with Theraband Resistance  - 1 x daily - 7 x weekly - 1 sets - 10 reps  ASSESSMENT: CLINICAL IMPRESSION: PT progressed balance activities which she continues to improve.  Pt appears on target to meet LTGs next week at end of plan of care.   OBJECTIVE IMPAIRMENTS: Abnormal gait, cardiopulmonary status limiting activity, decreased activity tolerance, decreased balance, decreased coordination, decreased endurance,  decreased knowledge of condition, decreased knowledge of use of DME, decreased mobility, difficulty walking, decreased ROM, decreased strength, increased edema, increased muscle spasms, impaired flexibility, impaired sensation, postural dysfunction, and pain.   ACTIVITY LIMITATIONS: carrying, lifting, bending, standing, squatting, sleeping, stairs, transfers, reach over head, and locomotion level  PARTICIPATION LIMITATIONS: meal prep, cleaning, driving, and community activity  PERSONAL FACTORS: Fitness, Time since onset of injury/illness/exacerbation, and 3+ comorbidities: see PMH  are also affecting patient's functional outcome.   REHAB POTENTIAL: Good  CLINICAL DECISION MAKING: Stable/uncomplicated  EVALUATION COMPLEXITY: Low   GOALS: Goals reviewed with patient? Yes  SHORT TERM GOALS: (target date for Short term goals are 30 days 07/16/2022)   1.  Patient will demonstrate independent use of updated home exercise program to maintain progress from in clinic treatments.  Goal status: MET 07/12/2022  2. Patient amb 200' & neg ramps/curbs without device with supervision.  Goal Status: MET 07/12/2022  LONG TERM GOALS: (target dates for all long term goals are 90 days 08/13/2022)   1. Patient will demonstrate/report pain at worst less than or equal to 2/10 to facilitate minimal limitation in daily activity secondary to pain symptoms. Goal status: Ongoing 07/07/2022   2. Patient will demonstrate independent use of home exercise program to facilitate ability to maintain/progress functional gains from skilled physical therapy services. Goal status: Ongoing 07/07/2022   3. Patient will demonstrate FOTO outcome > or = 47 % to indicate reduced disability due to condition.  Goal status: MET 06/30/2022   4.  Patient will demonstrate LLE MMT 5/5 throughout to faciltiate usual transfers, stairs, squatting at [facility] for daily life.   Goal status: Ongoing 07/07/2022   5.  Patient ambulates >500' with  LRAD and neg ramp, curb & stairs single rail modified independent.  Goal status: New   6.  AROM of Left knee [facility] & left ankle within 75% of right ankle Goal status: Ongoing 07/07/2022   PLAN:  PT FREQUENCY: 2-3 x/week  PT DURATION: 13 weeks (90 days)  PLANNED INTERVENTIONS: Therapeutic exercises, Therapeutic activity, Neuro Muscular re-education, Balance training, Gait training, Patient/Family education, Joint mobilization, Stair training, DME instructions, Dry Needling, Electrical stimulation, Traction, Cryotherapy, vasopneumatic deviceMoist heat, Taping, Ultrasound, Ionotophoresis 4mg /ml Dexamethasone, and Manual therapy.  All included unless contraindicated  PLAN FOR NEXT SESSION:  work towards LTGs, work on gait & balance without device, Cont progressing ther exer & therapeutic activities to tolerance including balance activities   Vladimir Faster, PT, DPT 08/02/2022, 8:38 AM

## 2022-08-04 ENCOUNTER — Ambulatory Visit: Payer: Medicare PPO | Admitting: Physical Therapy

## 2022-08-04 ENCOUNTER — Encounter: Payer: Self-pay | Admitting: Physical Therapy

## 2022-08-04 DIAGNOSIS — R2681 Unsteadiness on feet: Secondary | ICD-10-CM

## 2022-08-04 DIAGNOSIS — M6281 Muscle weakness (generalized): Secondary | ICD-10-CM | POA: Diagnosis not present

## 2022-08-04 DIAGNOSIS — M25562 Pain in left knee: Secondary | ICD-10-CM | POA: Diagnosis not present

## 2022-08-04 DIAGNOSIS — M25672 Stiffness of left ankle, not elsewhere classified: Secondary | ICD-10-CM | POA: Diagnosis not present

## 2022-08-04 DIAGNOSIS — M25572 Pain in left ankle and joints of left foot: Secondary | ICD-10-CM

## 2022-08-04 DIAGNOSIS — R600 Localized edema: Secondary | ICD-10-CM

## 2022-08-04 DIAGNOSIS — R2689 Other abnormalities of gait and mobility: Secondary | ICD-10-CM

## 2022-08-04 NOTE — Therapy (Signed)
OUTPATIENT PHYSICAL THERAPY LOWER EXTREMITY TREATMENT   Patient Name: Carolyn [name] MRN: [id number] DOB:Aug 14, 1955, 67 y.o., female Today's Date: 08/04/2022  END OF SESSION:  PT End of Session - 08/04/22 0800     Visit Number 22    Number of Visits 30    Date for PT Re-Evaluation 08/13/22    Authorization Type [facility] Medicare Choice PPO    Authorization Time Period $20 COPAY Approved Authorization #[id number]  Tracking #[id number] 4/15/-5/24    Authorization - Visit Number 10    Authorization - Number of Visits 12    Progress Note Due on Visit 20    PT Start Time 0800    PT Stop Time 0840    PT Time Calculation (min) 40 min    Equipment Utilized During Treatment Gait belt    Activity Tolerance Patient tolerated treatment well    Behavior During Therapy WFL for tasks assessed/performed                        Past Medical History:  Diagnosis Date   Alcoholic cirrhosis (HCC)    Chronic pancreatitis (HCC)    Diabetes mellitus without complication (HCC)    Endometriosis    GERD (gastroesophageal reflux disease)    Intractable nausea and vomiting 06/13/2020   Liver disease    Malabsorption    MALABSORPTION SYNDROME   Osteoporosis 03/2011   t score -2.5   Pancreatitis    due to cyst and tumors due to calcifications   Seizure (HCC)    no seizure disorder, controlled w/ meds per patient   Vitamin D deficiency 2012   VIT D 15   Past Surgical History:  Procedure Laterality Date   APPENDECTOMY     BIOPSY  01/19/2022   Procedure: BIOPSY;  Surgeon: Shellia Cleverly, DO;  Location: [facility];  Service: Gastroenterology;;   CHOLECYSTECTOMY     ESOPHAGOGASTRODUODENOSCOPY (EGD) WITH PROPOFOL N/A 01/19/2022   Procedure: ESOPHAGOGASTRODUODENOSCOPY (EGD) WITH PROPOFOL;  Surgeon: Shellia Cleverly, DO;  Location: [facility];  Service: Gastroenterology;  Laterality: N/A;   FEEDING TUBE RELOCATION  2010   JEJUNOSTOMY FEEDING TUBE  1990   MULTIPLE GASTRIC SURGERIES      MYOMECTOMY     OPEN REDUCTION INTERNAL FIXATION (ORIF) DISTAL RADIAL FRACTURE Left 06/29/2018   Procedure: OPEN REDUCTION INTERNAL FIXATION (ORIF)LEFT  DISTAL RADIAL FRACTURE;  Surgeon: Betha Loa, MD;  Location: [facility];  Service: Orthopedics;  Laterality: Left;   ROUX-EN-Y GASTRIC BYPASS     TIBIA IM NAIL INSERTION Left 04/21/2022   Procedure: LEFT INTRAMEDULLARY (IM) NAIL TIBIAL;  Surgeon: Eldred Manges, MD;  Location: [facility];  Service: Orthopedics;  Laterality: Left;   TIBIA IM NAIL INSERTION Left 05/05/2022   Procedure: LEFT TIBIAL NAIL DISTAL SCREW PLACEMENT;  Surgeon: Eldred Manges, MD;  Location: [facility];  Service: Orthopedics;  Laterality: Left;   TUBAL LIGATION     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   Palliative care patient 06/02/2022   History of gastric ulcer 03/23/2022   Abnormal loss of weight 03/23/2022   Acute on chronic diastolic CHF (congestive heart failure) (HCC) 03/23/2022   Cellulitis of left lower extremity 03/17/2022   Anemia of chronic disease 03/17/2022   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  Abnormal CT of the abdomen 01/19/2022   Chronic gastric ulcer with perforation (HCC) 01/19/2022   Alcoholic cirrhosis of liver without ascites (HCC) 01/18/2022   Occult blood in stools 01/18/2022   Portal hypertensive gastropathy (HCC) 01/18/2022   Hypoalbuminemia 01/11/2022   Physical deconditioning 01/09/2022   Pressure injury of skin 01/08/2022   Hypokalemia 01/07/2022   Severe protein-calorie malnutrition (HCC) 01/07/2022   Normocytic anemia 01/07/2022   Osteomyelitis (HCC) 01/06/2022   Seizure (HCC) 05/08/2017   Chronic pain syndrome 05/08/2017   Gastroesophageal reflux disease without esophagitis 05/08/2017   Chronic alcoholic  pancreatitis (HCC) 05/08/2017   Osteoporosis 03/16/2011   Malabsorption     PCP: General PCP   REFERRING PROVIDER:  Eldred Manges, MD   REFERRING DIAG:  S82.252A (ICD-10-CM) - Displaced comminuted fracture of shaft of left tibia, initial encounter for closed fracture  S82.832A (ICD-10-CM) - Closed fracture of proximal end of left fibula, initial encounter    THERAPY DIAG:  Muscle weakness (generalized)  Acute pain of left knee  Pain in left ankle and joints of left foot  Stiffness of left ankle, not elsewhere classified  Other abnormalities of gait and mobility  Unsteadiness on feet  Localized edema  Rationale for Evaluation and Treatment: Rehabilitation  ONSET DATE: 04/21/2022, 1st ankle sg, 05/05/2022 2nd surgery  SUBJECTIVE:   SUBJECTIVE STATEMENT:  She did a lot of gardening yesterday with squatting so left knee is sore.   PERTINENT HISTORY: DM, Alcoholism in remission, chronic pancreatitis, hepatic cirrhosis, She had prolonged hospitalization 03/13/2022-1/29/204 with severe sepsis included hypoxic respiratory failure.   PAIN: NPRS scale: today left knee 2/10 & ankle 0-1/10  Pain location: left knee more patella & ankle more medially Pain description: ache,  Aggravating factors:  meds wearing off, over night with increased in morning Relieving factors: meds, elevating & not moving knee / ankle  PRECAUTIONS: Fall  WEIGHT BEARING RESTRICTIONS: Yes LLE WBAT as of 05/18/2022  FALLS:  Has patient fallen in last 6 months? Yes. Number of falls 1  LIVING ENVIRONMENT: Lives with: lives with their spouse and 3 dogs 28, 22 & 50# Lives in: House single story  Stairs: Yes: External: 6 (long width)  steps; on right going up temporary ramp primary entrance Has following equipment at home: Dan Humphreys - 2 wheeled, Wheelchair (manual), Shower bench, bed side commode, Grab bars, and Ramped entry  OCCUPATION: retired   PLOF: Independent  PATIENT GOALS:   get back to function in  house & access community  Next MD visit:  05/18/2022  OBJECTIVE:  DIAGNOSTIC FINDINGS: 05/11/2022: Post distal tibial screw placement.  AP knee AP ankle appear to be in good rotational alignment.   PATIENT SURVEYS:  06/30/2022: FOTO 49% 05/17/2022 / Evaluation:  FOTO intake:  34%  predicted:  47%  COGNITION: 05/17/2022 / Evaluation:   Overall cognitive status: WFL    SENSATION: 05/17/2022 / Evaluation:   WFL for knee area but unable to test ankle as wrapped for wound  EDEMA:  05/17/2022 / Evaluation:  left foot & ankle appear edematous but wrapped due to wound.  POSTURE:  05/17/2022 / Evaluation:  rounded shoulders, forward head, increased thoracic kyphosis, flexed trunk , and weight shift right  PALPATION: 05/17/2022 / Evaluation: not tested due to wrapped for wound  LOWER EXTREMITY ROM:   ROM Left Eval 05/17/22 Left 05/26/22 Left 06/16/22 Left 07/28/22  Hip flexion      Hip extension      Hip abduction      Hip  adduction      Hip internal rotation      Hip external rotation      Knee flexion      Knee extension Seated A: LAQ -22* Seated  A: LAQ -11* Seated  A: LAQ -9* Seated A: LAQ -2*  Ankle dorsiflexion  Seated knee flexed A: -3* P: +2*  Seated knee flexed A: +3* P: +6* Seated knee flexed A: 11*  Ankle plantarflexion  P & A 19* P & A 21* P & A 28*  Ankle inversion      Ankle eversion      Ankle Not tested at evaluation due to wound with bandages.  (Blank rows = not tested)  LOWER EXTREMITY MMT:  MMT Left eval Left 07/28/22  Hip flexion    Hip extension    Hip abduction    Hip adduction    Hip internal rotation    Hip external rotation    Knee flexion 3-/5 4/5  Knee extension 3-/5 4/5  Ankle dorsiflexion  4/5  Ankle plantarflexion  3+/5 Standing 3 full range   Ankle inversion  4/5  Ankle eversion  4-/5  05/17/2022 / Evaluation:  Ankle Not tested at evaluation due to Toe touch Weightbearing status.  (Blank rows = not tested)   FUNCTIONAL TESTS:  06/30/2022:  Timed Up & Go: with cane 12.90 sec & without AD 11.53 sec  05/17/2022 / Evaluation: 18 inch chair transfer:  requires armrests or RW to push up to arise.  Requires RW support for stabilization  GAIT: 07/21/2022: Pt amb with cane community modified independent. Pt amb on level surfaces without device with verbal cues only for proper LLE rotation.   07/07/2022: pt amb 200' without device with supervision. She has LLE toe-in / internal rotation of hip but can correct with verbal cues.   06/28/2022: Pt amb 200' with cane stand alone tip with supervision on flat indoor surfaces. Gait velocity 2.53 ft/sec   05/17/2022 / Evaluation: Distance walked: 30' Assistive device utilized: Environmental consultant - 2 wheeled Level of assistance: SBA Comments: NWB LLE.  Turns with pivoting on foot incorrectly.    TODAY'S TREATMENT                                                                          DATE: 08/04/2022: Therapeutic Exercise: Leg Press BLEs 112# 15 reps 2 sets;  LLE only 50# 10 reps 2 sets Standing gastroc & soleus stretch on step heel depression 30 sec hold 2 reps Standing touch on mirror for balance: PF heel raises concentric BLEs eccentric LLE 10 reps 2 sets Step up, over & step down with 2 step lead BUE hovering over //bars on BOSU round side up BLEs 10 reps.  LLE Lateral step up on BOSU round side up with RLE crossover to work on LLE inversion / eversion control 10 rep in front & 10 reps in back. BUE support BAPs level 1 standing circles CW & CCW 10 reps ea with 1 rods 1/2#  proximal-medial slot to assist eversion.  Standing knee extended LLE hip external rotation green theraband 15 reps ea. Stepping LLE forward with green theraband resistance with proper ankle eversion & hip rotation 15 reps Seated ankle eversion green  theraband 15 reps Knee extension machine 15# 10 reps 2 sets Knee flexion machine 25# 10 reps 2 sets  PT used internet to discuss use of garden kneel bench. Pt reports that she has one  but has not thought about using it but will try it over weekend.   Neuromuscular Re-education: Braiding, forward tandem & backwards tandem with supervision 3 laps each Sidestepping on foam including transitioning on/off foam surfaces 3 laps with no touch required.  Heel walking & Toe walking 30' with supervision. Side stepping right & left on ramp with supervision Walking 30' with eyes closed to simulate walking in dark with supervision   08/02/2022: Therapeutic Exercise: Leg Press BLEs 112# 15 reps 2 sets;  LLE only 50# 10 reps 2 sets Standing gastroc & soleus stretch on step heel depression 30 sec hold 2 reps Standing touch on mirror for balance: PF heel raises concentric BLEs eccentric LLE 10 reps 2 sets Step up, over & step down with 2 step lead BUE hovering over //bars on BOSU round side up BLEs 10 reps.  BAPs level 1 standing circles CW & CCW 10 reps ea with 2 rods 1/2# ea medial slots to assist eversion.  Seated knee flexed & standing knee extended LLE hip external rotation green theraband 15 reps ea. Seated ankle eversion green theraband 15 reps Knee extension machine 15# 10 reps 2 sets Knee flexion machine 20# 10 reps 2 sets    Neuromuscular Re-education: Braiding, forward tandem & backwards tandem with supervision 3 laps each Sidestepping on foam including transitioning on/off foam surfaces 3 laps with no touch required.  Heel walking & Toe walking 30' with supervision. Side stepping right & left on ramp with supervision Walking 30' with eyes closed to simulate walking in dark with supervision   07/28/2022: Therapeutic Exercise: Leg Press BLEs 112# 15 reps 2 sets;  LLE only 50# 10 reps 2 sets See objective for ROM & MMT Standing touch on mirror for balance: PF heel raises concentric BLEs eccentric LLE 10 reps 2 sets Step up, over & step down with 2 step lead BUE hovering over //bars on BOSU round side up BLEs 10 reps. PT demo & verbal cues on technique Seated knee flexed  & standing knee extended LLE hip external rotation green theraband 15 reps ea. Seated ankle eversion green theraband 15 reps Recumbent bike seat 6 level 5 for 5 min.    Neuromuscular Re-education: Braiding, tandem & backwards gait with supervision  Sidestepping on foam including transitioning on/off foam surfaces 3 laps with no touch required.     HOME EXERCISE PROGRAM: Access Code: [email or web address] URL: [email or web address] Date: 07/26/2022 Prepared by: Vladimir Faster  Exercises - Seated Figure 4 Ankle Inversion with Resistance  - 1 x daily - 7 x weekly - 2 sets - 10 reps - 5 seconds hold - Seated Ankle Eversion with Anchored Resistance  - 1 x daily - 7 x weekly - 1 sets - 10 reps - 5 seconds hold - Standing Gastroc Stretch on Step  - 1 x daily - 7 x weekly - 1 sets - 2-3 reps - 30 seconds hold - Standing Soleus Stretch on Step  - 1 x daily - 7 x weekly - 1 sets - 2-3 reps - 30 seconds hold - Seated Hip External Rotation with Resistance  - 1 x daily - 7 x weekly - 1 sets - 10 reps - 5 seconds hold - Standing Hip External Rotation with Resistance Under Foot  - 1 x  daily - 7 x weekly - 1 sets - 10 reps - 5 seconds hold - Standing Hip Flexion with Resistance (Mirrored)  - 1 x daily - 7 x weekly - 1 sets - 10 reps - Standing Hip Adduction with Resistance (Mirrored)  - 1 x daily - 7 x weekly - 1 sets - 10 reps - Standing Hip Extension with Resistance  - 1 x daily - 7 x weekly - 1 sets - 10 reps - Standing Hip Abduction with Theraband Resistance  - 1 x daily - 7 x weekly - 1 sets - 10 reps  ASSESSMENT: CLINICAL IMPRESSION: Patient appears ready for discharge next session. Her balance & gait have improved significantly.   OBJECTIVE IMPAIRMENTS: Abnormal gait, cardiopulmonary status limiting activity, decreased activity tolerance, decreased balance, decreased coordination, decreased endurance, decreased knowledge of condition, decreased knowledge of use of DME, decreased mobility,  difficulty walking, decreased ROM, decreased strength, increased edema, increased muscle spasms, impaired flexibility, impaired sensation, postural dysfunction, and pain.   ACTIVITY LIMITATIONS: carrying, lifting, bending, standing, squatting, sleeping, stairs, transfers, reach over head, and locomotion level  PARTICIPATION LIMITATIONS: meal prep, cleaning, driving, and community activity  PERSONAL FACTORS: Fitness, Time since onset of injury/illness/exacerbation, and 3+ comorbidities: see PMH  are also affecting patient's functional outcome.   REHAB POTENTIAL: Good  CLINICAL DECISION MAKING: Stable/uncomplicated  EVALUATION COMPLEXITY: Low   GOALS: Goals reviewed with patient? Yes  SHORT TERM GOALS: (target date for Short term goals are 30 days 07/16/2022)   1.  Patient will demonstrate independent use of updated home exercise program to maintain progress from in clinic treatments.  Goal status: MET 07/12/2022  2. Patient amb 200' & neg ramps/curbs without device with supervision.  Goal Status: MET 07/12/2022  LONG TERM GOALS: (target dates for all long term goals are 90 days 08/13/2022)   1. Patient will demonstrate/report pain at worst less than or equal to 2/10 to facilitate minimal limitation in daily activity secondary to pain symptoms. Goal status: Ongoing 07/07/2022   2. Patient will demonstrate independent use of home exercise program to facilitate ability to maintain/progress functional gains from skilled physical therapy services. Goal status: Ongoing 07/07/2022   3. Patient will demonstrate FOTO outcome > or = 47 % to indicate reduced disability due to condition.  Goal status: MET 06/30/2022   4.  Patient will demonstrate LLE MMT 5/5 throughout to faciltiate usual transfers, stairs, squatting at [facility] for daily life.   Goal status: Ongoing 07/07/2022   5.  Patient ambulates >500' with LRAD and neg ramp, curb & stairs single rail modified independent.  Goal status: New    6.  AROM of Left knee [facility] & left ankle within 75% of right ankle Goal status: Ongoing 07/07/2022   PLAN:  PT FREQUENCY: 2-3 x/week  PT DURATION: 13 weeks (90 days)  PLANNED INTERVENTIONS: Therapeutic exercises, Therapeutic activity, Neuro Muscular re-education, Balance training, Gait training, Patient/Family education, Joint mobilization, Stair training, DME instructions, Dry Needling, Electrical stimulation, Traction, Cryotherapy, vasopneumatic deviceMoist heat, Taping, Ultrasound, Ionotophoresis 4mg /ml Dexamethasone, and Manual therapy.  All included unless contraindicated  PLAN FOR NEXT SESSION:  check LTGs with anticipated discharge   Vladimir Faster, PT, DPT 08/04/2022, 8:45 AM

## 2022-08-06 ENCOUNTER — Other Ambulatory Visit (HOSPITAL_COMMUNITY): Payer: Self-pay

## 2022-08-06 ENCOUNTER — Telehealth: Payer: Self-pay | Admitting: Internal Medicine

## 2022-08-06 ENCOUNTER — Other Ambulatory Visit: Payer: Self-pay

## 2022-08-06 ENCOUNTER — Encounter: Payer: Self-pay | Admitting: Student

## 2022-08-06 ENCOUNTER — Encounter: Payer: Self-pay | Admitting: Internal Medicine

## 2022-08-06 ENCOUNTER — Ambulatory Visit (INDEPENDENT_AMBULATORY_CARE_PROVIDER_SITE_OTHER): Payer: Medicare PPO | Admitting: Student

## 2022-08-06 VITALS — BP 104/64 | HR 88 | Temp 97.7°F | Ht 68.0 in | Wt 101.2 lb

## 2022-08-06 DIAGNOSIS — M8080XP Other osteoporosis with current pathological fracture, unspecified site, subsequent encounter for fracture with malunion: Secondary | ICD-10-CM

## 2022-08-06 DIAGNOSIS — I5032 Chronic diastolic (congestive) heart failure: Secondary | ICD-10-CM

## 2022-08-06 DIAGNOSIS — R233 Spontaneous ecchymoses: Secondary | ICD-10-CM

## 2022-08-06 DIAGNOSIS — N179 Acute kidney failure, unspecified: Secondary | ICD-10-CM | POA: Diagnosis not present

## 2022-08-06 DIAGNOSIS — E039 Hypothyroidism, unspecified: Secondary | ICD-10-CM

## 2022-08-06 DIAGNOSIS — K9089 Other intestinal malabsorption: Secondary | ICD-10-CM | POA: Diagnosis not present

## 2022-08-06 DIAGNOSIS — K860 Alcohol-induced chronic pancreatitis: Secondary | ICD-10-CM | POA: Diagnosis not present

## 2022-08-06 DIAGNOSIS — G894 Chronic pain syndrome: Secondary | ICD-10-CM

## 2022-08-06 MED ORDER — LEVOTHYROXINE SODIUM 50 MCG PO TABS
50.0000 ug | ORAL_TABLET | Freq: Every day | ORAL | 3 refills | Status: DC
  Filled 2022-08-06: qty 90, 90d supply, fill #0

## 2022-08-06 MED ORDER — BUPRENORPHINE 20 MCG/HR TD PTWK
1.0000 | MEDICATED_PATCH | TRANSDERMAL | 0 refills | Status: DC
  Filled 2022-08-06 (×4): qty 4, 28d supply, fill #0

## 2022-08-06 MED ORDER — BUPRENORPHINE HCL 75 MCG BU FILM
75.0000 ug | ORAL_FILM | Freq: Two times a day (BID) | BUCCAL | 0 refills | Status: DC
  Filled 2022-08-06: qty 60, fill #0

## 2022-08-06 MED ORDER — METOPROLOL TARTRATE 25 MG PO TABS
12.5000 mg | ORAL_TABLET | Freq: Two times a day (BID) | ORAL | 3 refills | Status: DC
  Filled 2022-08-06: qty 90, 90d supply, fill #0

## 2022-08-06 MED ORDER — BUPRENORPHINE HCL 75 MCG BU FILM
75.0000 ug | ORAL_FILM | Freq: Every day | BUCCAL | 0 refills | Status: DC
  Filled 2022-08-06: qty 30, 30d supply, fill #0

## 2022-08-06 NOTE — Patient Instructions (Addendum)
It was a pleasure seeing you today  We will check labs today and I will call with results   Please stop taking lasix for now and give [place] a call if you notice puffiness, swelling, or shortness of breath  I will call you regarding you pain medications, please continue the buprenorphine patches in the mean time  Please call to set up follow up with GI  [facility] Gastroenterologist in [place], [place] Located in: [place] Hat[facility] [address] Address: [address], [place], [place] [address]   Phone: [phone]  Follow up in 1 month

## 2022-08-06 NOTE — Progress Notes (Unsigned)
[facility]  Established Patient Office Visit  Subjective   Patient ID: Carolyn [name], female    DOB: 07-13-1955  Age: 67 y.o. MRN: [id number]  Chief Complaint  Patient presents with   Follow-up    1 month check    Carolyn [name] is a 67 y.o. person living with a history listed below who presents to clinic for follow up of CHF and chronic pancreatitis. Please refer to problem based charting for further details and assessment and plan of current problem and chronic medical conditions.     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   Abnormal CT of the abdomen 01/19/2022   Chronic gastric ulcer with perforation (HCC) 01/19/2022   Alcoholic cirrhosis of liver without ascites (HCC) 01/18/2022   Portal hypertensive gastropathy (HCC) 01/18/2022   Physical deconditioning 01/09/2022   Pressure injury of skin 01/08/2022   AKI (acute kidney injury) (HCC) 01/07/2022   Severe protein-calorie malnutrition (HCC) 01/07/2022   Normocytic anemia 01/07/2022   Osteomyelitis (HCC) 01/06/2022   Seizure (HCC) 05/08/2017   Chronic pain syndrome 05/08/2017   Gastroesophageal reflux disease without esophagitis 05/08/2017   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HCC) 05/08/2017   Osteoporosis 03/16/2011   Malabsorption       Review of Systems  Gastrointestinal:  Positive for abdominal pain (epigastic). Negative for blood in stool, constipation, diarrhea and vomiting.  Skin:        Skin tears      Objective:     BP 104/64 (BP Location: Right Arm, Patient  Position: Sitting, Cuff Size: Small)   Pulse 88   Temp 97.7 F (36.5 C) (Oral)   Ht 5\' 8"  (1.727 m)   Wt 101 lb 3.2 oz (45.9 kg)   LMP 03/10/2009   SpO2 100%   BMI 15.39 kg/m  BP Readings from Last 3 Encounters:  08/06/22 104/64  07/14/22 106/68  07/09/22 137/66      Physical Exam Constitutional:      Comments: Very frail appearing  HENT:     Mouth/Throat:     Mouth: Mucous membranes are moist.     Pharynx: Oropharynx is clear.  Eyes:     General: No scleral icterus.    Extraocular Movements: Extraocular movements intact.     Conjunctiva/sclera: Conjunctivae normal.     Pupils: Pupils are equal, round, and reactive to light.  Cardiovascular:     Rate and Rhythm: Normal rate and regular rhythm.     Pulses: Normal pulses.     Heart sounds: No murmur heard.    Comments: No JVD Pulmonary:     Effort: Pulmonary effort is normal.     Breath sounds: Normal breath sounds. No rhonchi or rales.  Abdominal:     General: Abdomen is flat. Bowel sounds are normal. There is no distension.     Palpations: Abdomen is soft. There is no mass.     Tenderness: There is abdominal tenderness (epigastrum).     Comments: No ascites, Scattered healed surgical scars  Musculoskeletal:        General: Normal range of motion.  Right lower leg: No edema.     Left lower leg: No edema.  Skin:    Capillary Refill: Capillary refill takes less than 2 seconds.     Coloration: Skin is not jaundiced.     Comments: Multiple skin tears which are cleaned and dressed, no signs of infection.  Skin is very fragile and thin appearing.  Areas of bruising scattered along upper extremities  Neurological:     General: No focal deficit present.     Mental Status: She is alert and oriented to person, place, and time.  Psychiatric:        Mood and Affect: Mood normal.        Behavior: Behavior normal.      Results for orders placed or performed in visit on 08/06/22  CMP14 + Anion Gap  Result Value Ref  Range   Glucose 94 70 - 99 mg/dL   BUN 21 8 - 27 mg/dL   Creatinine, Ser 9.81 0.57 - 1.00 mg/dL   eGFR 63 >19 JY/NWG/9.56   BUN/Creatinine Ratio 21 12 - 28   Sodium 135 134 - 144 mmol/L   Potassium 4.2 3.5 - 5.2 mmol/L   Chloride 101 96 - 106 mmol/L   CO2 19 (L) 20 - 29 mmol/L   Anion Gap 15.0 10.0 - 18.0 mmol/L   Calcium 8.8 8.7 - 10.3 mg/dL   Total Protein 6.9 6.0 - 8.5 g/dL   Albumin 4.0 3.9 - 4.9 g/dL   Globulin, Total 2.9 1.5 - 4.5 g/dL   Albumin/Globulin Ratio 1.4 1.2 - 2.2   Bilirubin Total 0.3 0.0 - 1.2 mg/dL   Alkaline Phosphatase 285 (H) 44 - 121 IU/L   AST 27 0 - 40 IU/L   ALT 18 0 - 32 IU/L    Last CBC Lab Results  Component Value Date   WBC 7.8 07/14/2022   HGB 11.5 (L) 07/14/2022   HCT 34.7 (L) 07/14/2022   MCV 88.7 07/14/2022   MCH 29.4 07/14/2022   RDW 13.1 07/14/2022   PLT 240 07/14/2022   Last metabolic panel Lab Results  Component Value Date   GLUCOSE 94 08/06/2022   NA 135 08/06/2022   K 4.2 08/06/2022   CL 101 08/06/2022   CO2 19 (L) 08/06/2022   BUN 21 08/06/2022   CREATININE 0.99 08/06/2022   EGFR 63 08/06/2022   CALCIUM 8.8 08/06/2022   PHOS 3.6 04/10/2022   PROT 6.9 08/06/2022   ALBUMIN 4.0 08/06/2022   LABGLOB 2.9 08/06/2022   AGRATIO 1.4 08/06/2022   BILITOT 0.3 08/06/2022   ALKPHOS 285 (H) 08/06/2022   AST 27 08/06/2022   ALT 18 08/06/2022   ANIONGAP 10 07/14/2022      The ASCVD Risk score (Arnett DK, et al., 2019) failed to calculate for the following reasons:   Cannot find a previous HDL lab   Cannot find a previous total cholesterol lab    Assessment & Plan:   Problem List Items Addressed This Visit     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HCC) (Chronic)   Relevant Medications   buprenorphine (BUTRANS) 20 MCG/HR PTWK   Buprenorphine HCl 75 MCG FILM   Chronic diastolic heart failure (HCC) - Primary (Chronic)    Patient has since resumed her Lasix  20 mg daily due to weight gain.  She is not currently taking any potassium  as she ran out about a week ago. Her weight is down to 101 pounds today and appears euvolemic.  I do not think  she needs to continue Lasix at this time.  Will repeat CMP to monitor potassium as she has lod K on lasix in the past. Will monitor weight and have her call clinic if having worsening swelling and >3 lb weight gain.      Relevant Medications   metoprolol tartrate (LOPRESSOR) 25 MG tablet   Other Relevant Orders   CMP14 + Anion Gap (Completed)   Chronic pain syndrome (Chronic)    Was able to pick up buprenorphine 20 mcg patches.  She has been changing this out weekly and is due to change to her fourth patch in a few days.  Was also taking Dilaudid 2 mg twice daily to to transition back to buprenorphine patches.  Reports she did still require Dilaudid occasionally for breakthrough pain however is now out. Also taking gabapentin 300 mg twice daily.  Still having some epigastric pain radiating to her back despite starting buprenorphine patch.Concern she may not have good absorption with transdermal patch due to low body fat.  Will add on sublingual buprenorphine 75 mcg daily for breakthrough pain.  If pain is not well-controlled may need to transition to oral full agonist for better pain control.  Will also need to discuss pain contract with patient as she will likely need chronic narcotic pain medications.      Relevant Medications   buprenorphine (BUTRANS) 20 MCG/HR PTWK   Buprenorphine HCl 75 MCG FILM   AKI (acute kidney injury) (HCC)    Elevated creatinine at ED visit on 07/14/2022 in the setting of chest pain after completing physical therapy earlier that day.  Appeared dehydrated at that time and given IV fluids.  Will repeat CMP today to evaluate kidney function      Hypothyroidism    Thyroid studies in the ED earlier this month with TSH of 0.035 but normal free T4 of 1.07.  Continue current dose of levothyroxine 50 mcg daily.      Relevant Medications   levothyroxine (SYNTHROID) 50  MCG tablet   metoprolol tartrate (LOPRESSOR) 25 MG tablet   Malabsorption    History of Roux-en-Y gastric bypass      Osteoporosis    Repeat CMP today to monitor alk phos levels. Previously normal GGT levels suspect alk phos elevations are related to bone etiology. May have had elevated levels due to to recent fracture in February.  Appears this has been trending down.        Spontaneous bruising    Discussed discontinue vitamin E supplements as this may make her more prone to bleeding        Return in about 4 weeks (around 09/03/2022).    Quincy Simmonds, MD

## 2022-08-06 NOTE — Telephone Encounter (Signed)
Pt states her pharmacy is stating she will need a Prior Authorization for the following medication  buprenorphine (BUTRANS) 20 MCG/HR PTWK   [facility] [place]

## 2022-08-07 LAB — CMP14 + ANION GAP
ALT: 18 IU/L (ref 0–32)
AST: 27 IU/L (ref 0–40)
Albumin/Globulin Ratio: 1.4 (ref 1.2–2.2)
Albumin: 4 g/dL (ref 3.9–4.9)
Alkaline Phosphatase: 285 IU/L — ABNORMAL HIGH (ref 44–121)
Anion Gap: 15 mmol/L (ref 10.0–18.0)
BUN/Creatinine Ratio: 21 (ref 12–28)
BUN: 21 mg/dL (ref 8–27)
Bilirubin Total: 0.3 mg/dL (ref 0.0–1.2)
CO2: 19 mmol/L — ABNORMAL LOW (ref 20–29)
Calcium: 8.8 mg/dL (ref 8.7–10.3)
Chloride: 101 mmol/L (ref 96–106)
Creatinine, Ser: 0.99 mg/dL (ref 0.57–1.00)
Globulin, Total: 2.9 g/dL (ref 1.5–4.5)
Glucose: 94 mg/dL (ref 70–99)
Potassium: 4.2 mmol/L (ref 3.5–5.2)
Sodium: 135 mmol/L (ref 134–144)
Total Protein: 6.9 g/dL (ref 6.0–8.5)
eGFR: 63 mL/min/{1.73_m2} (ref >59)

## 2022-08-10 ENCOUNTER — Ambulatory Visit: Payer: Medicare PPO | Admitting: Physical Therapy

## 2022-08-10 ENCOUNTER — Encounter: Payer: Self-pay | Admitting: Physical Therapy

## 2022-08-10 ENCOUNTER — Other Ambulatory Visit (HOSPITAL_COMMUNITY): Payer: Self-pay

## 2022-08-10 DIAGNOSIS — M6281 Muscle weakness (generalized): Secondary | ICD-10-CM | POA: Diagnosis not present

## 2022-08-10 DIAGNOSIS — M25572 Pain in left ankle and joints of left foot: Secondary | ICD-10-CM

## 2022-08-10 DIAGNOSIS — M25562 Pain in left knee: Secondary | ICD-10-CM

## 2022-08-10 DIAGNOSIS — R2689 Other abnormalities of gait and mobility: Secondary | ICD-10-CM

## 2022-08-10 DIAGNOSIS — M25672 Stiffness of left ankle, not elsewhere classified: Secondary | ICD-10-CM

## 2022-08-10 DIAGNOSIS — R2681 Unsteadiness on feet: Secondary | ICD-10-CM

## 2022-08-10 NOTE — Assessment & Plan Note (Addendum)
Was able to pick up buprenorphine 20 mcg patches.  She has been changing this out weekly and is due to change to her fourth patch in a few days.  Was also taking Dilaudid 2 mg twice daily to to transition back to buprenorphine patches.  Reports she did still require Dilaudid occasionally for breakthrough pain however is now out. Also taking gabapentin 300 mg twice daily.  Still having some epigastric pain radiating to her back despite starting buprenorphine patch.Concern she may not have good absorption with transdermal patch due to low body fat.  Will add on sublingual buprenorphine 75 mcg daily for breakthrough pain.  If pain is not well-controlled may need to transition to oral full agonist for better pain control.  Will also need to discuss pain contract with patient as she will likely need chronic narcotic pain medications.

## 2022-08-10 NOTE — Assessment & Plan Note (Signed)
History of Roux-en-Y gastric bypass

## 2022-08-10 NOTE — Assessment & Plan Note (Signed)
Discussed discontinue vitamin E supplements as this may make her more prone to bleeding

## 2022-08-10 NOTE — Assessment & Plan Note (Addendum)
Patient has since resumed her Lasix  20 mg daily due to weight gain.  She is not currently taking any potassium as she ran out about a week ago. Her weight is down to 101 pounds today and appears euvolemic.  I do not think she needs to continue Lasix at this time.  Will repeat CMP to monitor potassium as she has lod K on lasix in the past. Will monitor weight and have her call clinic if having worsening swelling and >3 lb weight gain.

## 2022-08-10 NOTE — Assessment & Plan Note (Signed)
Thyroid studies in the ED earlier this month with TSH of 0.035 but normal free T4 of 1.07.  Continue current dose of levothyroxine 50 mcg daily.

## 2022-08-10 NOTE — Therapy (Signed)
OUTPATIENT PHYSICAL THERAPY LOWER EXTREMITY TREATMENT & DISCHARGE SUMMARY   Patient Name: Carolyn [name] MRN: [id number] DOB:17-Mar-1955, 67 y.o., female Today's Date: 08/10/2022  PHYSICAL THERAPY DISCHARGE SUMMARY  Visits from Start of Care: 22  Current functional level related to goals / functional outcomes: See below   Remaining deficits: See below   Education / Equipment: Patient was educated in HEP which she appears to understand.    Patient agrees to discharge. Patient goals were partially met. Patient is being discharged due to meeting the stated rehab goals.   END OF SESSION:  PT End of Session - 08/10/22 0808     Visit Number 23    Number of Visits 30    Date for PT Re-Evaluation 08/13/22    Authorization Type [facility] Medicare Choice PPO    Authorization Time Period $20 COPAY Approved Authorization #[id number]  Tracking #[id number] 4/15/-5/24    Authorization - Visit Number 11    Authorization - Number of Visits 12    Progress Note Due on Visit 20    PT Start Time 0807    PT Stop Time 0838    PT Time Calculation (min) 31 min    Equipment Utilized During Treatment Gait belt    Activity Tolerance Patient tolerated treatment well    Behavior During Therapy WFL for tasks assessed/performed                         Past Medical History:  Diagnosis Date   Alcoholic cirrhosis (HCC)    Chronic pancreatitis (HCC)    Diabetes mellitus without complication (HCC)    Endometriosis    GERD (gastroesophageal reflux disease)    Intractable nausea and vomiting 06/13/2020   Liver disease    Malabsorption    MALABSORPTION SYNDROME   Osteoporosis 03/2011   t score -2.5   Pancreatitis    due to cyst and tumors due to calcifications   Seizure (HCC)    no seizure disorder, controlled w/ meds per patient   Vitamin D deficiency 2012   VIT D 15   Past Surgical History:  Procedure Laterality Date   APPENDECTOMY     BIOPSY  01/19/2022   Procedure: BIOPSY;   Surgeon: Shellia Cleverly, DO;  Location: [facility];  Service: Gastroenterology;;   CHOLECYSTECTOMY     ESOPHAGOGASTRODUODENOSCOPY (EGD) WITH PROPOFOL N/A 01/19/2022   Procedure: ESOPHAGOGASTRODUODENOSCOPY (EGD) WITH PROPOFOL;  Surgeon: Shellia Cleverly, DO;  Location: [facility];  Service: Gastroenterology;  Laterality: N/A;   FEEDING TUBE RELOCATION  2010   JEJUNOSTOMY FEEDING TUBE  1990   MULTIPLE GASTRIC SURGERIES     MYOMECTOMY     OPEN REDUCTION INTERNAL FIXATION (ORIF) DISTAL RADIAL FRACTURE Left 06/29/2018   Procedure: OPEN REDUCTION INTERNAL FIXATION (ORIF)LEFT  DISTAL RADIAL FRACTURE;  Surgeon: Betha Loa, MD;  Location:  SURGERY CENTER;  Service: Orthopedics;  Laterality: Left;   ROUX-EN-Y GASTRIC BYPASS     TIBIA IM NAIL INSERTION Left 04/21/2022   Procedure: LEFT INTRAMEDULLARY (IM) NAIL TIBIAL;  Surgeon: Eldred Manges, MD;  Location: [facility];  Service: Orthopedics;  Laterality: Left;   TIBIA IM NAIL INSERTION Left 05/05/2022   Procedure: LEFT TIBIAL NAIL DISTAL SCREW PLACEMENT;  Surgeon: Eldred Manges, MD;  Location: [facility];  Service: Orthopedics;  Laterality: Left;   TUBAL LIGATION     Patient Active Problem List   Diagnosis Date Noted   Medication adverse effect 07/02/2022   Pain and swelling of  right lower leg 06/23/2022   Spontaneous bruising 06/23/2022   Hypothyroidism 06/23/2022   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   Palliative care patient 06/02/2022   History of gastric ulcer 03/23/2022   Abnormal loss of weight 03/23/2022   Chronic diastolic heart failure (HCC) 03/23/2022   Cellulitis of left lower extremity 03/17/2022   Anemia of chronic disease 03/17/2022   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   Abnormal CT of the abdomen 01/19/2022   Chronic gastric ulcer with perforation (HCC) 01/19/2022   Alcoholic cirrhosis of liver without ascites (HCC) 01/18/2022   Portal hypertensive gastropathy (HCC) 01/18/2022   Physical deconditioning  01/09/2022   Pressure injury of skin 01/08/2022   Severe protein-calorie malnutrition (HCC) 01/07/2022   Normocytic anemia 01/07/2022   Osteomyelitis (HCC) 01/06/2022   Seizure (HCC) 05/08/2017   Chronic pain syndrome 05/08/2017   Gastroesophageal reflux disease without esophagitis 05/08/2017   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HCC) 05/08/2017   Osteoporosis 03/16/2011   Malabsorption     PCP: General PCP   REFERRING PROVIDER:  Eldred Manges, MD   REFERRING DIAG:  S82.252A (ICD-10-CM) - Displaced comminuted fracture of shaft of left tibia, initial encounter for closed fracture  S82.832A (ICD-10-CM) - Closed fracture of proximal end of left fibula, initial encounter    THERAPY DIAG:  Muscle weakness (generalized)  Pain in left ankle and joints of left foot  Acute pain of left knee  Stiffness of left ankle, not elsewhere classified  Other abnormalities of gait and mobility  Unsteadiness on feet  Rationale for Evaluation and Treatment: Rehabilitation  ONSET DATE: 04/21/2022, 1st ankle sg, 05/05/2022 2nd surgery  SUBJECTIVE:   SUBJECTIVE STATEMENT: She has been gardening.  She feels that she can do previous activities but is more cautious.   PERTINENT HISTORY: DM, Alcoholism in remission, chronic pancreatitis, hepatic cirrhosis, She had prolonged hospitalization 03/13/2022-1/29/204 with severe sepsis included hypoxic respiratory failure.   PAIN: NPRS scale: today left knee 1/10 & ankle 0/10  Pain location: left knee more patella & ankle more medially Pain description: ache,  Aggravating factors:  meds wearing off, over night with increased in morning Relieving factors: meds, elevating & not moving knee / ankle  PRECAUTIONS: Fall  WEIGHT BEARING RESTRICTIONS: Yes LLE WBAT as of 05/18/2022  FALLS:  Has patient fallen in last 6 months? Yes. Number of falls 1  LIVING ENVIRONMENT: Lives with: lives with their spouse and 3 dogs 95, 64 & 50# Lives in: House single story   Stairs: Yes: External: 6 (long width)  steps; on right going up temporary ramp primary entrance Has following equipment at home: Dan Humphreys - 2 wheeled, Wheelchair (manual), Shower bench, bed side commode, Grab bars, and Ramped entry  OCCUPATION: retired   PLOF: Independent  PATIENT GOALS:   get back to function in house & access community  Next MD visit:  05/18/2022  OBJECTIVE:  DIAGNOSTIC FINDINGS: 05/11/2022: Post distal tibial screw placement.  AP knee AP ankle appear to be in good rotational alignment.   PATIENT SURVEYS:  08/10/2202:  FOTO 72% 06/30/2022: FOTO 49% 05/17/2022 / Evaluation:  FOTO intake:  34%  predicted:  47%  COGNITION: 05/17/2022 / Evaluation:   Overall cognitive status: WFL    SENSATION: 05/17/2022 / Evaluation:   WFL for knee area but unable to test ankle as wrapped for wound  EDEMA:  05/17/2022 / Evaluation:  left foot & ankle appear edematous but wrapped due to wound.  POSTURE:  05/17/2022 / Evaluation:  rounded shoulders, forward head, increased thoracic kyphosis, flexed  trunk , and weight shift right  PALPATION: 05/17/2022 / Evaluation: not tested due to wrapped for wound  LOWER EXTREMITY ROM:   ROM Left Eval 05/17/22 Left 05/26/22 Left 06/16/22 Left 07/28/22 Right 08/10/22 Left 08/10/22  Hip flexion        Hip extension        Hip abduction        Hip adduction        Hip internal rotation        Hip external rotation        Knee flexion        Knee extension Seated A: LAQ -22* Seated  A: LAQ -11* Seated  A: LAQ -9* Seated A: LAQ -2*  Seated A: LAQ -2*  Ankle dorsiflexion  Seated knee flexed A: -3* P: +2*  Seated knee flexed A: +3* P: +6* Seated knee flexed A: 11* Seated knee flexed A: 13* Seated knee flexed A: 13*  Ankle plantarflexion  P & A 19* P & A 21* P & A 28* A: 29* A: 27*  Ankle inversion     A: 22* A: 21*  Ankle eversion     A: 12* A: 7*  Ankle Not tested at evaluation due to wound with bandages.  (Blank rows = not tested)  LOWER  EXTREMITY MMT:  MMT Left eval Left 07/28/22 Left 08/10/22  Hip flexion     Hip extension     Hip abduction     Hip adduction     Hip internal rotation     Hip external rotation     Knee flexion 3-/5 4/5 5/5  Knee extension 3-/5 4/5 5/5  Ankle dorsiflexion  4/5 4+/5  Ankle plantarflexion  3+/5 Standing 3 full range  4/5 Standing 10 full range  Ankle inversion  4/5 4+/5  Ankle eversion  4-/5 4/5  05/17/2022 / Evaluation:  Ankle Not tested at evaluation due to Toe touch Weightbearing status.  (Blank rows = not tested)   FUNCTIONAL TESTS:  08/10/2022: Functional Gait Assessment 24/30  [facility] PT Assessment - 08/10/22 0810       Functional Gait  Assessment   Gait assessed  Yes    Gait Level Surface Walks 20 ft in less than 5.5 sec, no assistive devices, good speed, no evidence for imbalance, normal gait pattern, deviates no more than 6 in outside of the 12 in walkway width.    Change in Gait Speed Able to smoothly change walking speed without loss of balance or gait deviation. Deviate no more than 6 in outside of the 12 in walkway width.    Gait with Horizontal Head Turns Performs head turns smoothly with no change in gait. Deviates no more than 6 in outside 12 in walkway width    Gait with Vertical Head Turns Performs head turns with no change in gait. Deviates no more than 6 in outside 12 in walkway width.    Gait and Pivot Turn Pivot turns safely within 3 sec and stops quickly with no loss of balance.    Step Over Obstacle Is able to step over one shoe box (4.5 in total height) without changing gait speed. No evidence of imbalance.    Gait with Narrow Base of Support Ambulates 4-7 steps.    Gait with Eyes Closed Walks 20 ft, uses assistive device, slower speed, mild gait deviations, deviates 6-10 in outside 12 in walkway width. Ambulates 20 ft in less than 9 sec but greater than 7 sec.  Ambulating Backwards Walks 20 ft, uses assistive device, slower speed, mild gait deviations,  deviates 6-10 in outside 12 in walkway width.    Steps Alternating feet, must use rail.    Total Score 24    FGA comment: Max score 30  Interpretation: 25-28 = low risk fall   19-24 = medium risk fall  < 19 = high risk fall             06/30/2022: Timed Up & Go: with cane 12.90 sec & without AD 11.53 sec  05/17/2022 / Evaluation: 18 inch chair transfer:  requires armrests or RW to push up to arise.  Requires RW support for stabilization  GAIT: 08/10/2022: pt amb >500' without device independently.  Gait Velocity: 3.65 ft/sec comfortable & 5.24 ft/sec fast pace Pt neg ramp & curb without device independently. Pt neg flight of 11 steps with single rail alternating pattern modified independent and up to 3 steps without rail (descend alternating & ascend step-to) modified independent  07/21/2022: Pt amb with cane community modified independent. Pt amb on level surfaces without device with verbal cues only for proper LLE rotation.   07/07/2022: pt amb 200' without device with supervision. She has LLE toe-in / internal rotation of hip but can correct with verbal cues.   06/28/2022: Pt amb 200' with cane stand alone tip with supervision on flat indoor surfaces. Gait velocity 2.53 ft/sec   05/17/2022 / Evaluation: Distance walked: 30' Assistive device utilized: Environmental consultant - 2 wheeled Level of assistance: SBA Comments: NWB LLE.  Turns with pivoting on foot incorrectly.    TODAY'S TREATMENT                                                                          DATE: 08/10/2022: Therapeutic Exercise:  See objective data  08/04/2022: Therapeutic Exercise: Leg Press BLEs 112# 15 reps 2 sets;  LLE only 50# 10 reps 2 sets Standing gastroc & soleus stretch on step heel depression 30 sec hold 2 reps Standing touch on mirror for balance: PF heel raises concentric BLEs eccentric LLE 10 reps 2 sets Step up, over & step down with 2 step lead BUE hovering over //bars on BOSU round side up BLEs 10 reps.   LLE Lateral step up on BOSU round side up with RLE crossover to work on LLE inversion / eversion control 10 rep in front & 10 reps in back. BUE support BAPs level 1 standing circles CW & CCW 10 reps ea with 1 rods 1/2#  proximal-medial slot to assist eversion.  Standing knee extended LLE hip external rotation green theraband 15 reps ea. Stepping LLE forward with green theraband resistance with proper ankle eversion & hip rotation 15 reps Seated ankle eversion green theraband 15 reps Knee extension machine 15# 10 reps 2 sets Knee flexion machine 25# 10 reps 2 sets  PT used internet to discuss use of garden kneel bench. Pt reports that she has one but has not thought about using it but will try it over weekend.   Neuromuscular Re-education: Braiding, forward tandem & backwards tandem with supervision 3 laps each Sidestepping on foam including transitioning on/off foam surfaces 3 laps with no touch required.  Heel walking & Toe walking  57' with supervision. Side stepping right & left on ramp with supervision Walking 30' with eyes closed to simulate walking in dark with supervision   08/02/2022: Therapeutic Exercise: Leg Press BLEs 112# 15 reps 2 sets;  LLE only 50# 10 reps 2 sets Standing gastroc & soleus stretch on step heel depression 30 sec hold 2 reps Standing touch on mirror for balance: PF heel raises concentric BLEs eccentric LLE 10 reps 2 sets Step up, over & step down with 2 step lead BUE hovering over //bars on BOSU round side up BLEs 10 reps.  BAPs level 1 standing circles CW & CCW 10 reps ea with 2 rods 1/2# ea medial slots to assist eversion.  Seated knee flexed & standing knee extended LLE hip external rotation green theraband 15 reps ea. Seated ankle eversion green theraband 15 reps Knee extension machine 15# 10 reps 2 sets Knee flexion machine 20# 10 reps 2 sets    Neuromuscular Re-education: Braiding, forward tandem & backwards tandem with supervision 3 laps  each Sidestepping on foam including transitioning on/off foam surfaces 3 laps with no touch required.  Heel walking & Toe walking 30' with supervision. Side stepping right & left on ramp with supervision Walking 30' with eyes closed to simulate walking in dark with supervision   HOME EXERCISE PROGRAM: Access Code: [email or web address] URL: [email or web address] Date: 07/26/2022 Prepared by: Vladimir Faster  Exercises - Seated Figure 4 Ankle Inversion with Resistance  - 1 x daily - 7 x weekly - 2 sets - 10 reps - 5 seconds hold - Seated Ankle Eversion with Anchored Resistance  - 1 x daily - 7 x weekly - 1 sets - 10 reps - 5 seconds hold - Standing Gastroc Stretch on Step  - 1 x daily - 7 x weekly - 1 sets - 2-3 reps - 30 seconds hold - Standing Soleus Stretch on Step  - 1 x daily - 7 x weekly - 1 sets - 2-3 reps - 30 seconds hold - Seated Hip External Rotation with Resistance  - 1 x daily - 7 x weekly - 1 sets - 10 reps - 5 seconds hold - Standing Hip External Rotation with Resistance Under Foot  - 1 x daily - 7 x weekly - 1 sets - 10 reps - 5 seconds hold - Standing Hip Flexion with Resistance (Mirrored)  - 1 x daily - 7 x weekly - 1 sets - 10 reps - Standing Hip Adduction with Resistance (Mirrored)  - 1 x daily - 7 x weekly - 1 sets - 10 reps - Standing Hip Extension with Resistance  - 1 x daily - 7 x weekly - 1 sets - 10 reps - Standing Hip Abduction with Theraband Resistance  - 1 x daily - 7 x weekly - 1 sets - 10 reps  ASSESSMENT: CLINICAL IMPRESSION: Patient met or partially met all LTGs. She appears to be functioning close to previous level and is pleased with her progress.   OBJECTIVE IMPAIRMENTS: Abnormal gait, cardiopulmonary status limiting activity, decreased activity tolerance, decreased balance, decreased coordination, decreased endurance, decreased knowledge of condition, decreased knowledge of use of DME, decreased mobility, difficulty walking, decreased ROM, decreased  strength, increased edema, increased muscle spasms, impaired flexibility, impaired sensation, postural dysfunction, and pain.   ACTIVITY LIMITATIONS: carrying, lifting, bending, standing, squatting, sleeping, stairs, transfers, reach over head, and locomotion level  PARTICIPATION LIMITATIONS: meal prep, cleaning, driving, and community activity  PERSONAL FACTORS: Fitness, Time since onset of injury/illness/exacerbation, and 3+ comorbidities:  see PMH  are also affecting patient's functional outcome.   REHAB POTENTIAL: Good  CLINICAL DECISION MAKING: Stable/uncomplicated  EVALUATION COMPLEXITY: Low   GOALS: Goals reviewed with patient? Yes  SHORT TERM GOALS: (target date for Short term goals are 30 days 07/16/2022)   1.  Patient will demonstrate independent use of updated home exercise program to maintain progress from in clinic treatments.  Goal status: MET 07/12/2022  2. Patient amb 200' & neg ramps/curbs without device with supervision.  Goal Status: MET 07/12/2022  LONG TERM GOALS: (target dates for all long term goals are 90 days 08/13/2022)   1. Patient will demonstrate/report pain at worst less than or equal to 2/10 to facilitate minimal limitation in daily activity secondary to pain symptoms. Goal status: MET 08/10/2022   2. Patient will demonstrate independent use of home exercise program to facilitate ability to maintain/progress functional gains from skilled physical therapy services. Goal status: MET 08/10/2022   3. Patient will demonstrate FOTO outcome > or = 47 % to indicate reduced disability due to condition.  Goal status: MET 08/10/2022   4.  Patient will demonstrate LLE MMT 5/5 throughout to faciltiate usual transfers, stairs, squatting at [facility] for daily life.   Goal status: Partially MET 08/10/2022   5.  Patient ambulates >500' with LRAD and neg ramp, curb & stairs single rail modified independent.  Goal status: MET 08/10/2022   6.  AROM of Left knee [facility] & left  ankle within 75% of right ankle Goal status: MET 08/10/2022   PLAN:  PT FREQUENCY: 2-3 x/week  PT DURATION: 13 weeks (90 days)  PLANNED INTERVENTIONS: Therapeutic exercises, Therapeutic activity, Neuro Muscular re-education, Balance training, Gait training, Patient/Family education, Joint mobilization, Stair training, DME instructions, Dry Needling, Electrical stimulation, Traction, Cryotherapy, vasopneumatic deviceMoist heat, Taping, Ultrasound, Ionotophoresis 4mg /ml Dexamethasone, and Manual therapy.  All included unless contraindicated  PLAN FOR NEXT SESSION:  Discharge PT   Vladimir Faster, PT, DPT 08/10/2022, 8:44 AM

## 2022-08-10 NOTE — Assessment & Plan Note (Signed)
Repeat CMP today to monitor alk phos levels. Previously normal GGT levels suspect alk phos elevations are related to bone etiology. May have had elevated levels due to to recent fracture in February.  Appears this has been trending down.

## 2022-08-10 NOTE — Assessment & Plan Note (Deleted)
Was able to pick up buprenorphine 20 mcg patches.  She has been changing this out weekly and is due to change to her fourth patch in a few days.  Was also taking Dilaudid

## 2022-08-10 NOTE — Assessment & Plan Note (Signed)
Elevated creatinine at ED visit on 07/14/2022 in the setting of chest pain after completing physical therapy earlier that day.  Appeared dehydrated at that time and given IV fluids.  Will repeat CMP today to evaluate kidney function

## 2022-08-12 ENCOUNTER — Encounter: Payer: Medicare PPO | Admitting: Physical Therapy

## 2022-08-13 ENCOUNTER — Encounter (HOSPITAL_BASED_OUTPATIENT_CLINIC_OR_DEPARTMENT_OTHER): Payer: Medicare PPO | Admitting: General Surgery

## 2022-08-13 DIAGNOSIS — L89512 Pressure ulcer of right ankle, stage 2: Secondary | ICD-10-CM | POA: Diagnosis not present

## 2022-08-16 NOTE — Progress Notes (Signed)
Internal Medicine Clinic Attending ? ?Case discussed with Dr. Liang  At the time of the visit.  We reviewed the resident?s history and exam and pertinent patient test results.  I agree with the assessment, diagnosis, and plan of care documented in the resident?s note. ? ?

## 2022-08-18 ENCOUNTER — Encounter: Payer: Self-pay | Admitting: *Deleted

## 2022-08-31 ENCOUNTER — Other Ambulatory Visit: Payer: Self-pay | Admitting: Student

## 2022-08-31 NOTE — Telephone Encounter (Signed)
Next appt scheduled 6/24 with Dr Aundria Rud.

## 2022-09-01 ENCOUNTER — Other Ambulatory Visit: Payer: Self-pay | Admitting: Internal Medicine

## 2022-09-01 ENCOUNTER — Other Ambulatory Visit (HOSPITAL_COMMUNITY): Payer: Self-pay

## 2022-09-06 ENCOUNTER — Ambulatory Visit: Payer: Medicare PPO

## 2022-09-06 ENCOUNTER — Other Ambulatory Visit (HOSPITAL_COMMUNITY): Payer: Self-pay

## 2022-09-06 VITALS — BP 122/76 | HR 115 | Ht 68.0 in | Wt 101.8 lb

## 2022-09-06 DIAGNOSIS — R627 Adult failure to thrive: Secondary | ICD-10-CM

## 2022-09-06 DIAGNOSIS — K7030 Alcoholic cirrhosis of liver without ascites: Secondary | ICD-10-CM

## 2022-09-06 DIAGNOSIS — N179 Acute kidney failure, unspecified: Secondary | ICD-10-CM

## 2022-09-06 DIAGNOSIS — K8689 Other specified diseases of pancreas: Secondary | ICD-10-CM

## 2022-09-06 DIAGNOSIS — K860 Alcohol-induced chronic pancreatitis: Secondary | ICD-10-CM

## 2022-09-06 DIAGNOSIS — K219 Gastro-esophageal reflux disease without esophagitis: Secondary | ICD-10-CM | POA: Diagnosis not present

## 2022-09-06 DIAGNOSIS — F1090 Alcohol use, unspecified, uncomplicated: Secondary | ICD-10-CM

## 2022-09-06 DIAGNOSIS — I5032 Chronic diastolic (congestive) heart failure: Secondary | ICD-10-CM

## 2022-09-06 DIAGNOSIS — G894 Chronic pain syndrome: Secondary | ICD-10-CM

## 2022-09-06 DIAGNOSIS — Z1231 Encounter for screening mammogram for malignant neoplasm of breast: Secondary | ICD-10-CM

## 2022-09-06 DIAGNOSIS — Z0000 Encounter for general adult medical examination without abnormal findings: Secondary | ICD-10-CM | POA: Insufficient documentation

## 2022-09-06 DIAGNOSIS — Z23 Encounter for immunization: Secondary | ICD-10-CM

## 2022-09-06 DIAGNOSIS — K255 Chronic or unspecified gastric ulcer with perforation: Secondary | ICD-10-CM

## 2022-09-06 DIAGNOSIS — L308 Other specified dermatitis: Secondary | ICD-10-CM

## 2022-09-06 MED ORDER — BUPRENORPHINE HCL 75 MCG BU FILM
75.0000 ug | ORAL_FILM | Freq: Every day | BUCCAL | 0 refills | Status: DC
Start: 2022-09-06 — End: 2022-10-04
  Filled 2022-09-06: qty 30, 30d supply, fill #0

## 2022-09-06 MED ORDER — BUPRENORPHINE 20 MCG/HR TD PTWK
1.0000 | MEDICATED_PATCH | TRANSDERMAL | 0 refills | Status: DC
Start: 2022-09-06 — End: 2022-10-04
  Filled 2022-09-06 (×2): qty 4, 28d supply, fill #0

## 2022-09-06 MED ORDER — PANTOPRAZOLE SODIUM 40 MG PO TBEC
40.0000 mg | DELAYED_RELEASE_TABLET | Freq: Every day | ORAL | 1 refills | Status: DC
Start: 2022-09-06 — End: 2023-04-12
  Filled 2022-09-06: qty 30, 30d supply, fill #0

## 2022-09-06 MED ORDER — GABAPENTIN 300 MG PO CAPS
300.0000 mg | ORAL_CAPSULE | Freq: Two times a day (BID) | ORAL | 1 refills | Status: DC
Start: 2022-09-06 — End: 2023-04-12
  Filled 2022-09-06: qty 60, 30d supply, fill #0

## 2022-09-06 NOTE — Patient Instructions (Addendum)
Thank you, Ms.Mette C Fabre for allowing [place] to provide your care today. Today we discussed   Pancreatic insufficiency: Checking your vitamin D and vitamin B 12. Will refill your pain medicines. Referred to palliative care.  Cirrhosis: Ordered hepatocellular carcinoma screening of the liver. You need to go back and see the GI doctors. Please call [facility] at [phone] to schedule this. You should also take protonix 40mg  daily.  Mammogram ordered.  Dermatology referral placed.  Pneumonia vaccine ordered.  I have ordered the following labs for you:   Lab Orders         CMP14 + Anion Gap         Vitamin D (25 hydroxy)         Vitamin B12        Referrals ordered today:    Referral Orders         Ambulatory referral to Dermatology      I have ordered the following medication/changed the following medications:   Stop the following medications: Medications Discontinued During This Encounter  Medication Reason   pantoprazole (PROTONIX) 40 MG tablet      Start the following medications: Meds ordered this encounter  Medications   pantoprazole (PROTONIX) 40 MG tablet    Sig: Take 1 tablet (40 mg total) by mouth daily.    Dispense:  30 tablet    Refill:  1     Follow up:  1 month      We look forward to seeing you next time. Please call our clinic at [phone] if you have any questions or concerns. The best time to call is Monday-Friday from 9am-4pm, but there is someone available 24/7. If after hours or the weekend, call the main hospital number and ask for the Internal Medicine Resident On-Call. If you need medication refills, please notify your pharmacy one week in advance and they will send [place] a request.   Thank you for trusting me with your care. Wishing you the best!  Willette Cluster, MD [facility]

## 2022-09-06 NOTE — Assessment & Plan Note (Addendum)
Last saw GI 01/2022 for perforated gastric ulcer and cirrhosis. No evidence of decompensated cirrhosis including encephalopathy, variceal bleeding, ascites. Was supposed to follow up with GI around the start of 2024 for repeat EGD and never did. Will order HCC screening ultrasound and provided number to patient to follow up with GI for EGD and general hepatic and gastric management. Restarted protonix 40mg  daily. No need for lasix at this time. Repeating CMP today.

## 2022-09-06 NOTE — Assessment & Plan Note (Signed)
Has had A1c don this year 5.3% no evidence of endocrine insufficiency. INR performed this year wnl at 1. Vitamin E low at 7.6 but supplementation stopped due to bleeding risk. Will get vitamin D and B12 today. All of these should be done annually. Taking creon. Denies diarrhea. No recent weight changes but BMI is 15.48 consistent with failure to thrive. Drinks Ensure. Referral to palliative placed per patient request.

## 2022-09-06 NOTE — Assessment & Plan Note (Signed)
Denies orthopnea, PND, exertional dyspnea, CP. Euvolemic on exam. Discussed continuing to hold lasix unless she has a 3 lbs weight gain as counseled on before.

## 2022-09-06 NOTE — Progress Notes (Signed)
Established Patient Office Visit  Subjective   Patient ID: Carolyn [name], female    DOB: 06/17/1955  Age: 67 y.o. MRN: [id number]  Chief Complaint  Patient presents with   Follow-up    Ms. Carolyn [name] is a 67 y/o female with a pmh outlined below. Please see A&P for HPI information.      Review of Systems  All other systems reviewed and are negative.     Objective:     BP 122/76 (BP Location: Left Arm, Patient Position: Sitting, Cuff Size: Normal)   Pulse (!) 115   Ht 5\' 8"  (1.727 m)   Wt 101 lb 12.8 oz (46.2 kg)   LMP 03/10/2009   SpO2 100%   BMI 15.48 kg/m    Physical Exam Constitutional:      General: She is not in acute distress.    Appearance: Normal appearance.  Eyes:     General: No scleral icterus.    Conjunctiva/sclera: Conjunctivae normal.  Cardiovascular:     Rate and Rhythm: Normal rate and regular rhythm.     Pulses: Normal pulses.     Heart sounds: Normal heart sounds. No murmur heard.    No friction rub. No gallop.  Pulmonary:     Effort: Pulmonary effort is normal. No respiratory distress.     Breath sounds: Normal breath sounds. No wheezing or rales.  Abdominal:     General: Abdomen is flat. Bowel sounds are normal. There is no distension.     Palpations: Abdomen is soft.     Tenderness: There is no abdominal tenderness. There is no guarding.  Musculoskeletal:     Right lower leg: No edema.     Left lower leg: No edema.  Skin:    General: Skin is warm and dry.  Neurological:     General: No focal deficit present.     Mental Status: She is alert.  Psychiatric:        Mood and Affect: Mood normal.        Behavior: Behavior normal.      No results found for any visits on 09/06/22.    The ASCVD Risk score (Arnett DK, et al., 2019) failed to calculate for the following reasons:   Cannot find a previous HDL lab   Cannot find a previous total cholesterol lab    Assessment & Plan:   Problem List Items Addressed This Visit        Cardiovascular and Mediastinum   Chronic diastolic heart failure (HCC) (Chronic)    Denies orthopnea, PND, exertional dyspnea, CP. Euvolemic on exam. Discussed continuing to hold lasix unless she has a 3 lbs weight gain as counseled on before.        Digestive   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HCC) (Chronic)    Reports buprenorphine patch weekly and film daily works well. Denies taking additional narcotics, denies misuse of narcotics, denies sedation, denies constipation. Refilled meds. Can get toxassure at next visit.      Relevant Medications   pantoprazole (PROTONIX) 40 MG tablet   Buprenorphine HCl 75 MCG FILM   buprenorphine (BUTRANS) 20 MCG/HR PTWK   Alcoholic cirrhosis of liver without ascites (HCC) (Chronic)    Last saw GI 01/2022 for perforated gastric ulcer and cirrhosis. No evidence of decompensated cirrhosis including encephalopathy, variceal bleeding, ascites. Was supposed to follow up with GI around the start of 2024 for repeat EGD and never did. Will order HCC screening ultrasound and provided number to patient to follow  up with GI for EGD and general hepatic and gastric management. Restarted protonix 40mg  daily. No need for lasix at this time. Repeating CMP today.      Relevant Orders   CMP14 + Anion Gap   US ABDOMEN LIMITED RUQ (LIVER/GB)   Amb Referral to Palliative Care   Gastroesophageal reflux disease without esophagitis   Relevant Medications   pantoprazole (PROTONIX) 40 MG tablet   Chronic gastric ulcer with perforation (HCC)    Needs repeat EGD with GI. Was supposed to have 8 week follow up EGD around the start of 2024. Does not appear she has been taking protonix. Gave her GI number for follow up and restarted PPI. No evidence of GI bleeding such as melena or hematochezia and no epigastric pain beyond normal pancreatic pain.      Secondary pancreatic insufficiency    Has had A1c don this year 5.3% no evidence of endocrine insufficiency. INR performed this  year wnl at 1. Vitamin E low at 7.6 but supplementation stopped due to bleeding risk. Will get vitamin D and B12 today. All of these should be done annually. Taking creon. Denies diarrhea. No recent weight changes but BMI is 15.48 consistent with failure to thrive. Drinks Ensure. Referral to palliative placed per patient request.       Relevant Orders   Vitamin D (25 hydroxy)   Vitamin B12   Amb Referral to Palliative Care     Musculoskeletal and Integument   Desquamative dermatitis    Previously hospitalized late 2023 for blistering desquamative dermatitis requiring steroids. 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 Denies follow up with dermatology, ambulatory referral to dermatology placed.      Relevant Orders   Ambulatory referral to Dermatology     Genitourinary   RESOLVED: AKI (acute kidney injury) (HCC)    CMP wnl 08/06/2022, AKI resolved.        Other   Chronic pain syndrome (Chronic)   Relevant Medications   Buprenorphine HCl 75 MCG FILM   buprenorphine (BUTRANS) 20 MCG/HR PTWK   gabapentin (NEURONTIN) 300 MG capsule   Healthcare maintenance    Screening mammogram ordered. Pneumonia vaccine given.      Other Visit Diagnoses     Alcohol-induced chronic pancreatitis (HCC)    -  Primary   Relevant Medications   pantoprazole (PROTONIX) 40 MG tablet   Buprenorphine HCl 75 MCG FILM   buprenorphine (BUTRANS) 20 MCG/HR PTWK   gabapentin (NEURONTIN) 300 MG capsule   Breast cancer screening by mammogram       Relevant Orders   MM 3D SCREENING MAMMOGRAM BILATERAL BREAST   Vaccine for streptococcus pneumoniae and influenza       Relevant Orders   Pneumococcal conjugate vaccine 20-valent (Prevnar 20) (Completed)   Failure to thrive in adult       Relevant Orders   Amb Referral to Palliative Care       Return in about 4 weeks (around 10/04/2022) for follow up of chronic medical problems.    Willette Cluster, MD

## 2022-09-06 NOTE — Assessment & Plan Note (Signed)
Needs repeat EGD with GI. Was supposed to have 8 week follow up EGD around the start of 2024. Does not appear she has been taking protonix. Gave her GI number for follow up and restarted PPI. No evidence of GI bleeding such as melena or hematochezia and no epigastric pain beyond normal pancreatic pain.

## 2022-09-06 NOTE — Assessment & Plan Note (Signed)
Screening mammogram ordered. Pneumonia vaccine given.

## 2022-09-06 NOTE — Assessment & Plan Note (Signed)
Previously hospitalized late 2023 for blistering desquamative dermatitis requiring steroids. 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 Denies follow up with dermatology, ambulatory referral to dermatology placed.

## 2022-09-06 NOTE — Progress Notes (Deleted)
HFpEF Not taking lasix Cmp wnl 5/24, K 4.2  Alcohol cirhossis Gastric ulcer EGD 03/2021 8 week follow up Abdominal US for [facility]  Alcohol pancreatitis cronic pancreatinc insufficiency Creon Beprenorphine 1 patch weekly  ?Serum retinol and retinol-binding protein ?Serum 25-hydroxyvitamin D (25[OH]D) ?Serum alpha-tocopherol levels ?International normalized ratio (INR) ?Prealbumin ?B12 ?HgBA1c to assess for concomitant pancreatogenic diabetes In addition, we perform a baseline dual-energy X-ray absorptiometry (DEXA) scan every one to two years [26].  Hypothyroid Thyroid studies in the ED earlier this month(06/2022) with TSH of 0.035 but normal free T4 of 1.07.  Continue current dose of levothyroxine 50 mcg daily.   Osteoporosis DXA ordered Alendronate 70mg  q 7 days  HCM Pap Colonoscopy Mammo Pcv 30

## 2022-09-06 NOTE — Assessment & Plan Note (Signed)
Reports buprenorphine patch weekly and film daily works well. Denies taking additional narcotics, denies misuse of narcotics, denies sedation, denies constipation. Refilled meds. Can get toxassure at next visit.

## 2022-09-06 NOTE — Assessment & Plan Note (Signed)
CMP wnl 08/06/2022, AKI resolved.

## 2022-09-07 ENCOUNTER — Other Ambulatory Visit (HOSPITAL_COMMUNITY): Payer: Self-pay

## 2022-09-07 LAB — CMP14 + ANION GAP
ALT: 33 IU/L — ABNORMAL HIGH (ref 0–32)
AST: 44 IU/L — ABNORMAL HIGH (ref 0–40)
Albumin: 3.9 g/dL (ref 3.9–4.9)
Alkaline Phosphatase: 261 IU/L — ABNORMAL HIGH (ref 44–121)
Anion Gap: 17 mmol/L (ref 10.0–18.0)
BUN/Creatinine Ratio: 27 (ref 12–28)
BUN: 21 mg/dL (ref 8–27)
Bilirubin Total: 0.8 mg/dL (ref 0.0–1.2)
CO2: 20 mmol/L (ref 20–29)
Calcium: 8.8 mg/dL (ref 8.7–10.3)
Chloride: 100 mmol/L (ref 96–106)
Creatinine, Ser: 0.77 mg/dL (ref 0.57–1.00)
Globulin, Total: 2.4 g/dL (ref 1.5–4.5)
Glucose: 117 mg/dL — ABNORMAL HIGH (ref 70–99)
Potassium: 3.4 mmol/L — ABNORMAL LOW (ref 3.5–5.2)
Sodium: 137 mmol/L (ref 134–144)
Total Protein: 6.3 g/dL (ref 6.0–8.5)
eGFR: 86 mL/min/{1.73_m2} (ref >59)

## 2022-09-07 LAB — VITAMIN D 25 HYDROXY (VIT D DEFICIENCY, FRACTURES): Vit D, 25-Hydroxy: 56.4 ng/mL (ref 30.0–100.0)

## 2022-09-07 LAB — VITAMIN B12: Vitamin B-12: >2000 pg/mL — ABNORMAL HIGH (ref 232–1245)

## 2022-09-07 NOTE — Progress Notes (Signed)
Internal Medicine Clinic Attending  Case discussed with Dr. Rogers  At the time of the visit.  We reviewed the resident's history and exam and pertinent patient test results.  I agree with the assessment, diagnosis, and plan of care documented in the resident's note.  

## 2022-09-10 ENCOUNTER — Ambulatory Visit (HOSPITAL_COMMUNITY)
Admission: EM | Admit: 2022-09-10 | Discharge: 2022-09-10 | Disposition: A | Discharge status: Home / Self Care | Payer: Medicare PPO | Attending: Family Medicine | Admitting: Family Medicine

## 2022-09-10 ENCOUNTER — Encounter (HOSPITAL_COMMUNITY): Payer: Self-pay

## 2022-09-10 DIAGNOSIS — L03116 Cellulitis of left lower limb: Secondary | ICD-10-CM

## 2022-09-10 DIAGNOSIS — L03115 Cellulitis of right lower limb: Secondary | ICD-10-CM

## 2022-09-10 DIAGNOSIS — L089 Local infection of the skin and subcutaneous tissue, unspecified: Secondary | ICD-10-CM | POA: Diagnosis not present

## 2022-09-10 MED ORDER — CEFTRIAXONE SODIUM 500 MG IJ SOLR
INTRAMUSCULAR | Status: AC
  Filled 2022-09-10: qty 500

## 2022-09-10 MED ORDER — LIDOCAINE HCL (PF) 1 % IJ SOLN
INTRAMUSCULAR | Status: AC
  Filled 2022-09-10: qty 2

## 2022-09-10 MED ORDER — CEFTRIAXONE SODIUM 500 MG IJ SOLR
500.0000 mg | INTRAMUSCULAR | Status: DC
  Administered 2022-09-10: 500 mg via INTRAMUSCULAR

## 2022-09-10 MED ORDER — SULFAMETHOXAZOLE-TRIMETHOPRIM 800-160 MG PO TABS: 1.0000 | ORAL_TABLET | Freq: Two times a day (BID) | ORAL | 0 refills | Status: AC

## 2022-09-10 NOTE — ED Triage Notes (Signed)
Patient reports that she tripped while out in the yard 2 weeks ago. Patient c/o redness and tenderness to the right shin.  Patient also c/o  left 2nd toe pain and states she has had an infection to that toe to the point that she almost had to have a toe amputation. (Approx 1 year ago.)

## 2022-09-10 NOTE — Discharge Instructions (Addendum)
You received Rocephin 500 mg IM injection which is an antibiotic.  Start Bactrim tonight after dinner take twice daily with food for the next 10 days to treat cellulitis.  If your left toe continues to swell or the redness worsens, I would like for you to follow-up at [facility] for further evaluation of left 2nd toe.  If at any point you develop fever, chills or any of your symptoms worsen go immediately to the emergency department.

## 2022-09-10 NOTE — ED Provider Notes (Signed)
[facility]    CSN: [id number] Arrival date & time: 09/10/22  1527      History   Chief Complaint Chief Complaint  Patient presents with   Wound Check    HPI Carolyn [name] is a 67 y.o. female.   HPI Patient with a history of type 2 diabetes (per patient she doesn't have diabetes), chronic recurrent cellulitis, chronic pancreatitis presents today for evaluation of a right toe wound and a leg wound. Concern cellulitis is worsening. Patients denies fever, nausea, or vomiting.  She was treated for cellulitis of the right lower extremity back in March placed on Keflex 500 mg 4 times daily and at that time was advised to follow-up at the wound care clinic.  She was subsequently mated by Dr. Due to concerns that wound may be vascular related.  In April she was placed on Cipro 500 mg for total of 5 days due to wound right leg appeared not to be improving.  Reports redness, tenderness, and swelling involving the right leg has worsened over the last week.  Past Medical History:  Diagnosis Date   Alcoholic cirrhosis (HCC)    Chronic pancreatitis (HCC)    Diabetes mellitus without complication (HCC)    Endometriosis    GERD (gastroesophageal reflux disease)    Intractable nausea and vomiting 06/13/2020   Liver disease    Malabsorption    MALABSORPTION SYNDROME   Osteoporosis 03/2011   t score -2.5   Pancreatitis    due to cyst and tumors due to calcifications   Seizure (HCC)    no seizure disorder, controlled w/ meds per patient   Vitamin D deficiency 2012   VIT D 15    Patient Active Problem List   Diagnosis Date Noted   Healthcare maintenance 09/06/2022   Medication adverse effect 07/02/2022   Pain and swelling of right lower leg 06/23/2022   Spontaneous bruising 06/23/2022   Hypothyroidism 06/23/2022   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   Palliative care patient 06/02/2022   History of gastric ulcer 03/23/2022   Abnormal loss of weight 03/23/2022    Chronic diastolic heart failure (HCC) 03/23/2022   Anemia of chronic disease 03/17/2022   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   Abnormal CT of the abdomen 01/19/2022   Chronic gastric ulcer with perforation (HCC) 01/19/2022   Alcoholic cirrhosis of liver without ascites (HCC) 01/18/2022   Portal hypertensive gastropathy (HCC) 01/18/2022   Physical deconditioning 01/09/2022   Pressure injury of skin 01/08/2022   Severe protein-calorie malnutrition (HCC) 01/07/2022   Normocytic anemia 01/07/2022   Osteomyelitis (HCC) 01/06/2022   Seizure (HCC) 05/08/2017   Chronic pain syndrome 05/08/2017   Gastroesophageal reflux disease without esophagitis 05/08/2017   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HCC) 05/08/2017   Osteoporosis 03/16/2011   Malabsorption     Past Surgical History:  Procedure Laterality Date   APPENDECTOMY     BIOPSY  01/19/2022   Procedure: BIOPSY;  Surgeon: Shellia Cleverly, DO;  Location: [facility];  Service: Gastroenterology;;   CHOLECYSTECTOMY     ESOPHAGOGASTRODUODENOSCOPY (EGD) WITH PROPOFOL N/A 01/19/2022   Procedure: ESOPHAGOGASTRODUODENOSCOPY (EGD) WITH PROPOFOL;  Surgeon: Shellia Cleverly, DO;  Location: [facility];  Service: Gastroenterology;  Laterality: N/A;   FEEDING TUBE RELOCATION  2010   JEJUNOSTOMY FEEDING TUBE  1990   MULTIPLE GASTRIC SURGERIES     MYOMECTOMY     OPEN REDUCTION INTERNAL FIXATION (ORIF) DISTAL RADIAL FRACTURE Left 06/29/2018   Procedure: OPEN REDUCTION INTERNAL FIXATION (ORIF)LEFT  DISTAL RADIAL FRACTURE;  Surgeon: Betha Loa, MD;  Location: [facility];  Service: Orthopedics;  Laterality: Left;   ROUX-EN-Y GASTRIC BYPASS     TIBIA IM NAIL INSERTION Left 04/21/2022   Procedure: LEFT INTRAMEDULLARY (IM) NAIL TIBIAL;  Surgeon: Eldred Manges, MD;  Location: [facility];  Service: Orthopedics;  Laterality: Left;   TIBIA IM NAIL INSERTION Left 05/05/2022   Procedure: LEFT TIBIAL NAIL DISTAL SCREW PLACEMENT;  Surgeon: Eldred Manges,  MD;  Location: [facility];  Service: Orthopedics;  Laterality: Left;   TUBAL LIGATION      OB History     Gravida  0   Para      Term      Preterm      AB      Living         SAB      IAB      Ectopic      Multiple      Live Births               Home Medications    Prior to Admission medications   Medication Sig Start Date End Date Taking? Authorizing Provider  sulfamethoxazole-trimethoprim (BACTRIM DS) 800-160 MG tablet Take 1 tablet by mouth 2 (two) times daily for 10 days. 09/10/22 09/20/22 Yes Bing Neighbors, NP  acetaminophen (TYLENOL) 325 MG tablet Take 2 tablets (650 mg total) by mouth every 4 (four) hours as needed for mild pain (temp > 101.5). 04/12/22   Alwyn Ren, MD  alendronate (FOSAMAX) 70 MG/75ML solution Take 75 mLs (70 mg total) by mouth every 7 (seven) days. Take with a full glass of water on an empty stomach. 06/30/22   Adron Bene, MD  buprenorphine (BUTRANS) 20 MCG/HR PTWK Place 1 patch onto the skin once a week. 09/06/22   Willette Cluster, MD  Buprenorphine HCl 75 MCG FILM Place 1 film inside cheek daily at 6 (six) AM. 09/06/22 10/08/22  Willette Cluster, MD  cholecalciferol (CHOLECALCIFEROL) 25 MCG tablet Take 1 tablet (1,000 Units total) by mouth daily. 04/13/22   Alwyn Ren, MD  famotidine (PEPCID) 10 MG tablet Take 10 mg by mouth 2 (two) times daily.    [provider]  feeding supplement (ENSURE ENLIVE / ENSURE PLUS) LIQD Take 237 mLs by mouth 3 (three) times daily between meals. 04/12/22   Alwyn Ren, MD  furosemide (LASIX) 20 MG tablet Take 1 tablet (20 mg total) by mouth daily. 07/12/22   Adron Bene, MD  gabapentin (NEURONTIN) 300 MG capsule Take 1 capsule (300 mg total) by mouth 2 (two) times daily. 09/06/22   Willette Cluster, MD  levothyroxine (SYNTHROID) 50 MCG tablet Take 1 tablet (50 mcg total) by mouth daily at 6 (six) AM. 08/06/22   Quincy Simmonds, MD  lipase/protease/amylase (CREON) 12000-38000  units CPEP capsule Take 2 capsules (24,000 Units total) by mouth 3 (three) times daily with meals. 06/02/22   Edsel Petrin, DO  loperamide (IMODIUM) 2 MG capsule Take 1 capsule (2 mg total) by mouth every 6 (six) hours as needed for diarrhea or loose stools. 04/12/22   Alwyn Ren, MD  Melatonin 5 MG CAPS Take 1 capsule by mouth at bedtime.    [provider]  metoprolol tartrate (LOPRESSOR) 25 MG tablet Take 1/2 tablet (12.5 mg total) by mouth 2 (two) times daily. 08/06/22 09/05/22  Quincy Simmonds, MD  Multiple Vitamin (MULITIVITAMIN WITH MINERALS) TABS Take 1 tablet by mouth daily.    [provider]  Nutritional Supplements (,FEEDING SUPPLEMENT, PROSOURCE PLUS) liquid Take 30 mLs by mouth 2 (two) times daily between meals. 04/12/22   Alwyn Ren, MD  pantoprazole (PROTONIX) 40 MG tablet Take 1 tablet (40 mg total) by mouth daily. 09/06/22   Willette Cluster, MD  polyethylene glycol (MIRALAX / GLYCOLAX) 17 g packet Take 17 g by mouth 2 (two) times daily as needed for mild constipation. 01/25/22   Briant Cedar, MD  pyridOXINE (B-6) 100 MG tablet Take 1 tablet (100 mg total) by mouth daily. 04/13/22   Alwyn Ren, MD  saccharomyces boulardii (FLORASTOR) 250 MG capsule Take 1 capsule (250 mg total) by mouth 2 (two) times daily. 04/12/22   Alwyn Ren, MD  selenium 100 MCG TABS Take 1 tablet (100 mcg total) by mouth daily. 04/13/22   Alwyn Ren, MD  silver sulfADIAZINE (SILVADENE) 1 % cream Apply topically daily. Patient taking differently: Apply 1 Application topically daily. 04/13/22   Alwyn Ren, MD  vitamin A 3 MG (10000 UNITS) capsule Take 1 capsule (10,000 Units total) by mouth every other day. 04/13/22   Alwyn Ren, MD  vitamin B-12 (CYANOCOBALAMIN) 100 MCG tablet Take 100 mcg by mouth daily.    [provider]  zinc sulfate 220 (50 Zn) MG capsule Take 1 capsule (220 mg total) by mouth daily. 01/09/22    Almon Hercules, MD  PROMETHAZINE HCL PO Take 4 mg by mouth.   06/06/11  [provider]  Ranitidine HCl (ZANTAC PO) Take by mouth.    06/06/11  [provider]    Family History Family History  Problem Relation Age of Onset   Cancer Father        BONE   Breast cancer Maternal Grandmother     Social History Social History   Tobacco Use   Smoking status: Never   Smokeless tobacco: Never  Vaping Use   Vaping Use: Never used  Substance Use Topics   Alcohol use: Not Currently    Comment: occ ( none in several weeks)   Drug use: No     Allergies   Other, Peanut-containing drug products, and Pecan extract   Review of Systems Review of Systems Pertinent negatives listed in HPI  Physical Exam Triage Vital Signs ED Triage Vitals  Enc Vitals Group     BP      Pulse      Resp      Temp      Temp src      SpO2      Weight      Height      Head Circumference      Peak Flow      Pain Score      Pain Loc      Pain Edu?      Excl. in GC?    No data found.  Updated Vital Signs BP (!) 146/74 (BP Location: Right Arm)   Pulse 86   Temp 97.9 F (36.6 C) (Oral)   Resp 16   LMP 03/10/2009   SpO2 98%   Visual Acuity Right Eye Distance:   Left Eye Distance:   Bilateral Distance:    Right Eye Near:   Left Eye Near:    Bilateral Near:     Physical Exam Constitutional:      General: She is not in acute distress.    Appearance: Normal appearance.  HENT:     Head: Normocephalic and  atraumatic.  Eyes:     Extraocular Movements: Extraocular movements intact.     Pupils: Pupils are equal, round, and reactive to light.  Cardiovascular:     Rate and Rhythm: Normal rate and regular rhythm.  Pulmonary:     Effort: Pulmonary effort is normal.     Breath sounds: Normal breath sounds.  Musculoskeletal:     Cervical back: Normal range of motion and neck supple.     Right lower leg: Edema present.     Left lower leg: Edema present.  Skin:    General:  Skin is warm and dry.     Findings: Erythema present. No rash.  Neurological:     General: No focal deficit present.     Mental Status: She is alert and oriented to person, place, and time.  Psychiatric:        Mood and Affect: Mood normal.      UC Treatments / Results  Labs (all labs ordered are listed, but only abnormal results are displayed) Labs Reviewed - No data to display  EKG   Radiology No results found.  Procedures Procedures (including critical care time)  Medications Ordered in UC Medications - No data to display  Initial Impression / Assessment and Plan / UC Course  I have reviewed the triage vital signs and the nursing notes.  Pertinent labs & imaging results that were available during my care of the patient were reviewed by me and considered in my medical decision making (see chart for details).    Current cellulitis on evaluation skin infection is present in both right and lower extremity along with left second toe.  Patient received Rocephin 500 mg IM here in clinic today will continue home treatment with Bactrim double strength twice daily for 10 days.  Encourage patient to follow-up with [facility] and or [facility] for ongoing management of ankle and foot wounds.  Return as needed. Final Clinical Impressions(s) / UC Diagnoses   Final diagnoses:  Cellulitis of left lower extremity  Cellulitis of right lower extremity  Toe infection, left 2nd toe     Discharge Instructions      You received Rocephin 500 mg IM injection which is an antibiotic.  Start Bactrim tonight after dinner take twice daily with food for the next 10 days to treat cellulitis.  If your left toe continues to swell or the redness worsens, I would like for you to follow-up at [facility] for further evaluation of left 2nd toe.  If at any point you develop fever, chills or any of your symptoms worsen go immediately to the emergency department.     ED  Prescriptions     Medication Sig Dispense Auth. Provider   sulfamethoxazole-trimethoprim (BACTRIM DS) 800-160 MG tablet Take 1 tablet by mouth 2 (two) times daily for 10 days. 20 tablet Bing Neighbors, NP      PDMP not reviewed this encounter.   Bing Neighbors, NP 09/15/22 [id number]

## 2022-09-17 ENCOUNTER — Telehealth: Payer: Self-pay | Admitting: *Deleted

## 2022-09-17 ENCOUNTER — Other Ambulatory Visit (HOSPITAL_COMMUNITY): Payer: Self-pay

## 2022-09-17 ENCOUNTER — Other Ambulatory Visit (HOSPITAL_BASED_OUTPATIENT_CLINIC_OR_DEPARTMENT_OTHER): Payer: Self-pay

## 2022-09-17 NOTE — Telephone Encounter (Signed)
Patient was contacted with her [place] appointment 09/23/2022 @ 10:00 am at [place]. Patient was aware and reminder letter with instruction mailed to the patient.

## 2022-09-23 ENCOUNTER — Ambulatory Visit (HOSPITAL_COMMUNITY)
Admission: RE | Admit: 2022-09-23 | Discharge: 2022-09-23 | Disposition: A | Discharge status: Home / Self Care | Payer: Medicare PPO | Source: Ambulatory Visit | Attending: Internal Medicine | Admitting: Internal Medicine

## 2022-09-23 ENCOUNTER — Encounter (HOSPITAL_COMMUNITY): Payer: Self-pay

## 2022-09-23 ENCOUNTER — Other Ambulatory Visit (HOSPITAL_COMMUNITY): Payer: Self-pay

## 2022-09-23 ENCOUNTER — Ambulatory Visit (HOSPITAL_COMMUNITY)
Admission: EM | Admit: 2022-09-23 | Discharge: 2022-09-23 | Disposition: A | Discharge status: Home / Self Care | Payer: Medicare PPO | Attending: Emergency Medicine | Admitting: Emergency Medicine

## 2022-09-23 DIAGNOSIS — A084 Viral intestinal infection, unspecified: Secondary | ICD-10-CM

## 2022-09-23 DIAGNOSIS — K7030 Alcoholic cirrhosis of liver without ascites: Secondary | ICD-10-CM | POA: Diagnosis not present

## 2022-09-23 MED ORDER — ONDANSETRON HCL 4 MG/2ML IJ SOLN
4.0000 mg | Freq: Once | INTRAMUSCULAR | Status: AC
  Administered 2022-09-23: 4 mg via INTRAMUSCULAR

## 2022-09-23 MED ORDER — ONDANSETRON HCL 4 MG/2ML IJ SOLN
INTRAMUSCULAR | Status: AC
  Filled 2022-09-23: qty 2

## 2022-09-23 MED ORDER — ONDANSETRON 4 MG PO TBDP
4.0000 mg | ORAL_TABLET | Freq: Three times a day (TID) | ORAL | 0 refills | Status: DC | PRN
  Filled 2022-09-23: qty 20, 7d supply, fill #0

## 2022-09-23 MED ORDER — PROMETHAZINE HCL 12.5 MG PO TABS
12.5000 mg | ORAL_TABLET | Freq: Four times a day (QID) | ORAL | 0 refills | Status: DC | PRN
  Filled 2022-09-23: qty 30, 8d supply, fill #0

## 2022-09-23 NOTE — ED Triage Notes (Addendum)
Patient reports that she began having N/V/D and headache that began 2 days go. Patient states the headache has gone away now.  Patient states she sampled food at the [employer] prior to these symptoms.  Patient states she has had persistent vomiting and diarrhea.  Patient states she took Zofran, Kaopectate, and Pepto Bismol which has not worked.   Patient added that she had an abdominal US this AM, but was scheduled prior to these symptoms.

## 2022-09-23 NOTE — ED Provider Notes (Signed)
[facility]    CSN: [id number] Arrival date & time: 09/23/22  1122      History   Chief Complaint Chief Complaint  Patient presents with   Emesis   Diarrhea    HPI Carolyn [name] is a 67 y.o. female.   Patient presents for evaluation of nausea, vomiting and diarrhea beginning 2 days ago.  Symptoms starting after tasting food at the [employer].  Last occurrence of vomiting and diarrhea today.  Diarrhea described as watery.  Has been able able to tolerate food and liquids.  Intermittently experiencing headache, currently resolved.  Abdominal pain unchanged from baseline.  History of pancreatitis and cirrhosis.  Has attempted use of Zofran, Kaopectate and Pepto-Bismol which have been ineffective.  Denies fevers, URI symptoms.  No other member of household has similar symptoms.  Had completed abdominal ultrasound today, ordered prior to symptoms beginning. Past Medical History:  Diagnosis Date   Alcoholic cirrhosis (HCC)    Chronic pancreatitis (HCC)    Diabetes mellitus without complication (HCC)    Endometriosis    GERD (gastroesophageal reflux disease)    Intractable nausea and vomiting 06/13/2020   Liver disease    Malabsorption    MALABSORPTION SYNDROME   Osteoporosis 03/2011   t score -2.5   Pancreatitis    due to cyst and tumors due to calcifications   Seizure (HCC)    no seizure disorder, controlled w/ meds per patient   Vitamin D deficiency 2012   VIT D 15    Patient Active Problem List   Diagnosis Date Noted   Healthcare maintenance 09/06/2022   Medication adverse effect 07/02/2022   Pain and swelling of right lower leg 06/23/2022   Spontaneous bruising 06/23/2022   Hypothyroidism 06/23/2022   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   Palliative care patient 06/02/2022   History of gastric ulcer 03/23/2022   Abnormal loss of weight 03/23/2022   Chronic diastolic heart failure (HCC) 03/23/2022   Anemia of chronic disease 03/17/2022    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   Abnormal CT of the abdomen 01/19/2022   Chronic gastric ulcer with perforation (HCC) 01/19/2022   Alcoholic cirrhosis of liver without ascites (HCC) 01/18/2022   Portal hypertensive gastropathy (HCC) 01/18/2022   Physical deconditioning 01/09/2022   Pressure injury of skin 01/08/2022   Severe protein-calorie malnutrition (HCC) 01/07/2022   Normocytic anemia 01/07/2022   Osteomyelitis (HCC) 01/06/2022   Seizure (HCC) 05/08/2017   Chronic pain syndrome 05/08/2017   Gastroesophageal reflux disease without esophagitis 05/08/2017   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HCC) 05/08/2017   Osteoporosis 03/16/2011   Malabsorption     Past Surgical History:  Procedure Laterality Date   APPENDECTOMY     BIOPSY  01/19/2022   Procedure: BIOPSY;  Surgeon: Shellia Cleverly, DO;  Location: [facility];  Service: Gastroenterology;;   CHOLECYSTECTOMY     ESOPHAGOGASTRODUODENOSCOPY (EGD) WITH PROPOFOL N/A 01/19/2022   Procedure: ESOPHAGOGASTRODUODENOSCOPY (EGD) WITH PROPOFOL;  Surgeon: Shellia Cleverly, DO;  Location: [facility];  Service: Gastroenterology;  Laterality: N/A;   FEEDING TUBE RELOCATION  2010   JEJUNOSTOMY FEEDING TUBE  1990   MULTIPLE GASTRIC SURGERIES     MYOMECTOMY     OPEN REDUCTION INTERNAL FIXATION (ORIF) DISTAL RADIAL FRACTURE Left 06/29/2018   Procedure: OPEN REDUCTION INTERNAL FIXATION (ORIF)LEFT  DISTAL RADIAL FRACTURE;  Surgeon: Betha Loa, MD;  Location: [facility];  Service: Orthopedics;  Laterality: Left;   ROUX-EN-Y GASTRIC BYPASS     TIBIA IM NAIL INSERTION Left 04/21/2022  Procedure: LEFT INTRAMEDULLARY (IM) NAIL TIBIAL;  Surgeon: Eldred Manges, MD;  Location: [facility];  Service: Orthopedics;  Laterality: Left;   TIBIA IM NAIL INSERTION Left 05/05/2022   Procedure: LEFT TIBIAL NAIL DISTAL SCREW PLACEMENT;  Surgeon: Eldred Manges, MD;  Location: [facility];  Service: Orthopedics;  Laterality: Left;   TUBAL LIGATION      OB  History     Gravida  0   Para      Term      Preterm      AB      Living         SAB      IAB      Ectopic      Multiple      Live Births               Home Medications    Prior to Admission medications   Medication Sig Start Date End Date Taking? Authorizing Provider  acetaminophen (TYLENOL) 325 MG tablet Take 2 tablets (650 mg total) by mouth every 4 (four) hours as needed for mild pain (temp > 101.5). 04/12/22   Alwyn Ren, MD  alendronate (FOSAMAX) 70 MG/75ML solution Take 75 mLs (70 mg total) by mouth every 7 (seven) days. Take with a full glass of water on an empty stomach. 06/30/22   Adron Bene, MD  buprenorphine (BUTRANS) 20 MCG/HR PTWK Place 1 patch onto the skin once a week. 09/06/22   Willette Cluster, MD  Buprenorphine HCl 75 MCG FILM Place 1 film inside cheek daily at 6 (six) AM. 09/06/22 10/08/22  Willette Cluster, MD  cholecalciferol (CHOLECALCIFEROL) 25 MCG tablet Take 1 tablet (1,000 Units total) by mouth daily. 04/13/22   Alwyn Ren, MD  famotidine (PEPCID) 10 MG tablet Take 10 mg by mouth 2 (two) times daily.    [provider]  feeding supplement (ENSURE ENLIVE / ENSURE PLUS) LIQD Take 237 mLs by mouth 3 (three) times daily between meals. 04/12/22   Alwyn Ren, MD  furosemide (LASIX) 20 MG tablet Take 1 tablet (20 mg total) by mouth daily. 07/12/22   Adron Bene, MD  gabapentin (NEURONTIN) 300 MG capsule Take 1 capsule (300 mg total) by mouth 2 (two) times daily. 09/06/22   Willette Cluster, MD  levothyroxine (SYNTHROID) 50 MCG tablet Take 1 tablet (50 mcg total) by mouth daily at 6 (six) AM. 08/06/22   Quincy Simmonds, MD  lipase/protease/amylase (CREON) 12000-38000 units CPEP capsule Take 2 capsules (24,000 Units total) by mouth 3 (three) times daily with meals. 06/02/22   Edsel Petrin, DO  loperamide (IMODIUM) 2 MG capsule Take 1 capsule (2 mg total) by mouth every 6 (six) hours as needed for diarrhea or  loose stools. 04/12/22   Alwyn Ren, MD  Melatonin 5 MG CAPS Take 1 capsule by mouth at bedtime.    [provider]  metoprolol tartrate (LOPRESSOR) 25 MG tablet Take 1/2 tablet (12.5 mg total) by mouth 2 (two) times daily. 08/06/22 09/05/22  Quincy Simmonds, MD  Multiple Vitamin (MULITIVITAMIN WITH MINERALS) TABS Take 1 tablet by mouth daily.    [provider]  Nutritional Supplements (,FEEDING SUPPLEMENT, PROSOURCE PLUS) liquid Take 30 mLs by mouth 2 (two) times daily between meals. 04/12/22   Alwyn Ren, MD  pantoprazole (PROTONIX) 40 MG tablet Take 1 tablet (40 mg total) by mouth daily. 09/06/22   Willette Cluster, MD  polyethylene glycol (MIRALAX / GLYCOLAX) 17 g packet Take  17 g by mouth 2 (two) times daily as needed for mild constipation. 01/25/22   Briant Cedar, MD  pyridOXINE (B-6) 100 MG tablet Take 1 tablet (100 mg total) by mouth daily. 04/13/22   Alwyn Ren, MD  saccharomyces boulardii (FLORASTOR) 250 MG capsule Take 1 capsule (250 mg total) by mouth 2 (two) times daily. 04/12/22   Alwyn Ren, MD  selenium 100 MCG TABS Take 1 tablet (100 mcg total) by mouth daily. 04/13/22   Alwyn Ren, MD  silver sulfADIAZINE (SILVADENE) 1 % cream Apply topically daily. Patient taking differently: Apply 1 Application topically daily. 04/13/22   Alwyn Ren, MD  vitamin A 3 MG (10000 UNITS) capsule Take 1 capsule (10,000 Units total) by mouth every other day. 04/13/22   Alwyn Ren, MD  vitamin B-12 (CYANOCOBALAMIN) 100 MCG tablet Take 100 mcg by mouth daily.    [provider]  zinc sulfate 220 (50 Zn) MG capsule Take 1 capsule (220 mg total) by mouth daily. 01/09/22   Almon Hercules, MD  PROMETHAZINE HCL PO Take 4 mg by mouth.   06/06/11  [provider]  Ranitidine HCl (ZANTAC PO) Take by mouth.    06/06/11  [provider]    Family History Family History  Problem Relation Age of Onset    Cancer Father        BONE   Breast cancer Maternal Grandmother     Social History Social History   Tobacco Use   Smoking status: Never   Smokeless tobacco: Never  Vaping Use   Vaping status: Never Used  Substance Use Topics   Alcohol use: Not Currently    Comment: occ ( none in several weeks)   Drug use: No     Allergies   Other, Peanut-containing drug products, and Pecan extract   Review of Systems Review of Systems  Constitutional: Negative.   Respiratory: Negative.    Cardiovascular: Negative.   Gastrointestinal:  Positive for diarrhea and vomiting. Negative for abdominal distention, abdominal pain, anal bleeding, blood in stool, constipation, nausea and rectal pain.     Physical Exam Triage Vital Signs ED Triage Vitals  Encounter Vitals Group     BP 09/23/22 1200 (!) 148/90     Systolic BP Percentile --      Diastolic BP Percentile --      Pulse Rate 09/23/22 1200 92     Resp 09/23/22 1200 14     Temp 09/23/22 1200 97.6 F (36.4 C)     Temp Source 09/23/22 1200 Oral     SpO2 09/23/22 1200 97 %     Weight --      Height --      Head Circumference --      Peak Flow --      Pain Score 09/23/22 1203 0     Pain Loc --      Pain Education --      Exclude from Growth Chart --    No data found.  Updated Vital Signs BP (!) 148/90 (BP Location: Right Arm)   Pulse 92   Temp 97.6 F (36.4 C) (Oral)   Resp 14   LMP 03/10/2009   SpO2 97%   Visual Acuity Right Eye Distance:   Left Eye Distance:   Bilateral Distance:    Right Eye Near:   Left Eye Near:    Bilateral Near:     Physical Exam Constitutional:  Appearance: Normal appearance.  Eyes:     Extraocular Movements: Extraocular movements intact.  Abdominal:     General: Abdomen is flat. Bowel sounds are increased.     Palpations: Abdomen is soft.     Tenderness: There is no abdominal tenderness.  Neurological:     Mental Status: She is alert and oriented to person, place, and time.       UC Treatments / Results  Labs (all labs ordered are listed, but only abnormal results are displayed) Labs Reviewed - No data to display  EKG   Radiology US ABDOMEN LIMITED RUQ (LIVER/GB)  Result Date: 09/23/2022 CLINICAL DATA:  Cirrhosis EXAM: ULTRASOUND ABDOMEN LIMITED RIGHT UPPER QUADRANT COMPARISON:  CT abdomen pelvis 03/24/2022 FINDINGS: Gallbladder: Surgically absent Common bile duct: Diameter: 4.7 mm Liver: Increased echogenicity. No focal lesion. Portal vein is patent on color Doppler imaging with normal direction of blood flow towards the liver. Other: None. IMPRESSION: 1. Status post cholecystectomy. 2. Increased hepatic parenchymal echogenicity suggestive of steatosis. Electronically Signed   By: Annia Belt M.D.   On: 09/23/2022 11:05    Procedures Procedures (including critical care time)  Medications Ordered in UC Medications - No data to display  Initial Impression / Assessment and Plan / UC Course  I have reviewed the triage vital signs and the nursing notes.  Pertinent labs & imaging results that were available during my care of the patient were reviewed by me and considered in my medical decision making (see chart for details).  Viral gastroenteritis  Vital signs are stable patient is in no signs of distress nontoxic-appearing, no tenderness noted to the abdominal exam however bowel sounds increased, stable for outpatient management, Zofran injection given in office and refill prescribed Phenergan as patient has had success with this medicine in the past, may continue use of Kaopectate and Pepto-Bismol for management of diarrhea, advised increase fluid intake to maintain hydration until symptoms have resolved recommended a bland diet additionally, may follow-up with urgent care as needed if symptoms persist or worsen Final Clinical Impressions(s) / UC Diagnoses   Final diagnoses:  None   Discharge Instructions   None    ED Prescriptions   None     PDMP not reviewed this encounter.   Valinda Hoar, [place] 09/23/22 [phone]

## 2022-09-23 NOTE — Discharge Instructions (Signed)
Your symptoms are most likely caused by a virus, it will work its way out your system over the next few days  You have been given an injection of Zofran here today in the office to help minimize vomiting, will start to see effects within the next 30 minutes  You can use zofran every 8 hours as needed for nausea, be mindful this medication may make you drowsy, take the first dose at home to see how it affects your body  If Zofran ineffective he may use 1 to 2 tablets of promethazine every 6 hours as needed  May continue use of Kaopectate and Pepto-Bismol for management of diarrhea  You can use over-the-counter ibuprofen or Tylenol, which ever you have at home, to help manage fevers  Continue to promote hydration throughout the day by using electrolyte replacement solution such as Gatorade, body armor, Pedialyte, which ever you have at home  Try eating bland foods such as bread, rice, toast, fruit which are easier on the stomach to digest, avoid foods that are overly spicy, overly seasoned or greasy

## 2022-10-04 ENCOUNTER — Other Ambulatory Visit: Payer: Self-pay

## 2022-10-04 ENCOUNTER — Encounter: Payer: Self-pay | Admitting: Student

## 2022-10-04 ENCOUNTER — Other Ambulatory Visit (HOSPITAL_BASED_OUTPATIENT_CLINIC_OR_DEPARTMENT_OTHER): Payer: Self-pay

## 2022-10-04 ENCOUNTER — Other Ambulatory Visit (HOSPITAL_COMMUNITY): Payer: Self-pay

## 2022-10-04 ENCOUNTER — Ambulatory Visit (INDEPENDENT_AMBULATORY_CARE_PROVIDER_SITE_OTHER): Payer: Medicare PPO | Admitting: Student

## 2022-10-04 VITALS — BP 142/101 | HR 97 | Temp 98.2°F | Resp 28 | Ht 68.0 in | Wt 100.6 lb

## 2022-10-04 DIAGNOSIS — F1090 Alcohol use, unspecified, uncomplicated: Secondary | ICD-10-CM

## 2022-10-04 DIAGNOSIS — K255 Chronic or unspecified gastric ulcer with perforation: Secondary | ICD-10-CM

## 2022-10-04 DIAGNOSIS — K860 Alcohol-induced chronic pancreatitis: Secondary | ICD-10-CM | POA: Diagnosis not present

## 2022-10-04 DIAGNOSIS — R030 Elevated blood-pressure reading, without diagnosis of hypertension: Secondary | ICD-10-CM | POA: Diagnosis not present

## 2022-10-04 DIAGNOSIS — G894 Chronic pain syndrome: Secondary | ICD-10-CM

## 2022-10-04 MED ORDER — BUPRENORPHINE 20 MCG/HR TD PTWK
1.0000 | MEDICATED_PATCH | TRANSDERMAL | 0 refills | Status: DC
Start: 2022-10-04 — End: 2022-10-28
  Filled 2022-10-04 (×2): qty 4, 28d supply, fill #0

## 2022-10-04 MED ORDER — BUPRENORPHINE HCL 75 MCG BU FILM
75.0000 ug | ORAL_FILM | Freq: Every day | BUCCAL | 0 refills | Status: DC
Start: 2022-10-04 — End: 2022-10-28
  Filled 2022-10-04 (×2): qty 30, 30d supply, fill #0

## 2022-10-04 NOTE — Assessment & Plan Note (Signed)
No acute concerns at this time. Still taking buprenorphine patch weekly and 75 mcg film daily. See "Chronic pain syndrome" A&P for further details.

## 2022-10-04 NOTE — Progress Notes (Signed)
CC: medication refill   HPI:  Carolyn [name] is a 67 y.o. female living with a history stated below and presents today for medication refill. Please see problem based assessment and plan for additional details.  Past Medical History:  Diagnosis Date   Alcoholic cirrhosis (HCC)    Chronic pancreatitis (HCC)    Diabetes mellitus without complication (HCC)    Endometriosis    GERD (gastroesophageal reflux disease)    Intractable nausea and vomiting 06/13/2020   Liver disease    Malabsorption    MALABSORPTION SYNDROME   Osteoporosis 03/2011   t score -2.5   Pancreatitis    due to cyst and tumors due to calcifications   Seizure (HCC)    no seizure disorder, controlled w/ meds per patient   Vitamin D deficiency 2012   VIT D 15    Current Outpatient Medications on File Prior to Visit  Medication Sig Dispense Refill   acetaminophen (TYLENOL) 325 MG tablet Take 2 tablets (650 mg total) by mouth every 4 (four) hours as needed for mild pain (temp > 101.5).     alendronate (FOSAMAX) 70 MG/75ML solution Take 75 mLs (70 mg total) by mouth every 7 (seven) days. Take with a full glass of water on an empty stomach. 75 mL 12   cholecalciferol (CHOLECALCIFEROL) 25 MCG tablet Take 1 tablet (1,000 Units total) by mouth daily.     famotidine (PEPCID) 10 MG tablet Take 10 mg by mouth 2 (two) times daily.     feeding supplement (ENSURE ENLIVE / ENSURE PLUS) LIQD Take 237 mLs by mouth 3 (three) times daily between meals. 237 mL 12   furosemide (LASIX) 20 MG tablet Take 1 tablet (20 mg total) by mouth daily. 30 tablet 0   gabapentin (NEURONTIN) 300 MG capsule Take 1 capsule (300 mg total) by mouth 2 (two) times daily. 60 capsule 1   levothyroxine (SYNTHROID) 50 MCG tablet Take 1 tablet (50 mcg total) by mouth daily at 6 (six) AM. 90 tablet 3   lipase/protease/amylase (CREON) 12000-38000 units CPEP capsule Take 2 capsules (24,000 Units total) by mouth 3 (three) times daily with meals. 540 each 2    loperamide (IMODIUM) 2 MG capsule Take 1 capsule (2 mg total) by mouth every 6 (six) hours as needed for diarrhea or loose stools. 30 capsule 0   Melatonin 5 MG CAPS Take 1 capsule by mouth at bedtime.     metoprolol tartrate (LOPRESSOR) 25 MG tablet Take 1/2 tablet (12.5 mg total) by mouth 2 (two) times daily. 90 tablet 3   Multiple Vitamin (MULITIVITAMIN WITH MINERALS) TABS Take 1 tablet by mouth daily.     Nutritional Supplements (,FEEDING SUPPLEMENT, PROSOURCE PLUS) liquid Take 30 mLs by mouth 2 (two) times daily between meals.     ondansetron (ZOFRAN-ODT) 4 MG disintegrating tablet Dissolve 1 tablet (4 mg total) in mouth every 8 (eight) hours as needed for nausea or vomiting. 20 tablet 0   pantoprazole (PROTONIX) 40 MG tablet Take 1 tablet (40 mg total) by mouth daily. 30 tablet 1   polyethylene glycol (MIRALAX / GLYCOLAX) 17 g packet Take 17 g by mouth 2 (two) times daily as needed for mild constipation. 14 each 0   promethazine (PHENERGAN) 12.5 MG tablet Take 1 tablet (12.5 mg total) by mouth every 6 (six) hours as needed for nausea or vomiting. 30 tablet 0   pyridOXINE (B-6) 100 MG tablet Take 1 tablet (100 mg total) by mouth daily.  saccharomyces boulardii (FLORASTOR) 250 MG capsule Take 1 capsule (250 mg total) by mouth 2 (two) times daily.     selenium 100 MCG TABS Take 1 tablet (100 mcg total) by mouth daily.  0   silver sulfADIAZINE (SILVADENE) 1 % cream Apply topically daily. (Patient taking differently: Apply 1 Application topically daily.) 50 g 0   vitamin A 3 MG (10000 UNITS) capsule Take 1 capsule (10,000 Units total) by mouth every other day. 30 capsule    vitamin B-12 (CYANOCOBALAMIN) 100 MCG tablet Take 100 mcg by mouth daily.     zinc sulfate 220 (50 Zn) MG capsule Take 1 capsule (220 mg total) by mouth daily.     [DISCONTINUED] Ranitidine HCl (ZANTAC PO) Take by mouth.       No current facility-administered medications on file prior to visit.   Review of Systems: ROS  negative except for what is noted on the assessment and plan.  Vitals:   10/04/22 0957 10/04/22 1001  BP: (!) 145/95 (!) 142/101  Pulse: 98 97  Resp: (!) 28   Temp: 98.2 F (36.8 C)   TempSrc: Oral   SpO2: 100%   Weight: 100 lb 9.6 oz (45.6 kg)   Height: 5\' 8"  (1.727 m)    Physical Exam: Constitutional: alert, female sitting in chair comfortably, in no acute distress Cardiovascular: regular rate and rhythm Pulmonary/Chest: normal work of breathing on room air, lungs clear to auscultation bilaterally MSK: sling in place for RUE, no LE edema Neurological: alert & oriented x 3 Skin: warm and dry Psych: pleasant mood  Assessment & Plan:   Chronic pain syndrome Presents for 1 month f/u. Taking buprenorphine 20 mcg patches weekly and buprenorphine 75 mcg daily. Reports pain well controlled on current regimen. Denies any respiratory or mentation concerns. Also on gabapentin 300 mg BID. Medications helping with chronic pain but also recent acute right shoulder pain with recent imaging showing concern for nondisplaced greater tuberosity fracture. ToxAssure from April appropriate. PDMP reviewed and appropriate. Requested 1 month OV f/u. Consider if stable regimen to send additional refills. Completed [facility] contract today with [facility].  Plan -Refilled buprenorphine 20 mcg patch weekly  -Refilled buprenorphine 75 mcg film daily -Pain contract signed today, plan for 1 month f/u  -ToxAssure at future visit   Chronic gastric ulcer with perforation (HCC) Has not seen GI yet. Denies any acute change today. Reports taking PPI. Provided GI contact info today and patient verbalizes that she will contact them.   Elevated blood pressure reading BP mildly elevated today. Last OV was normal. She is taking metoprolol 12.5 mg BID. Recent fall with shoulder xray findings concerning for nondisplaced right greater tuberosity fracture. Acute pain could contribute to elevated BP today. Reassess at next OV.    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HCC) No acute concerns at this time. Still taking buprenorphine patch weekly and 75 mcg film daily. See "Chronic pain syndrome" A&P for further details.    Patient discussed with Dr. Docia Furl, D.O. [facility], PGY-2 Phone: [phone] Date 10/04/2022 Time 12:51 PM

## 2022-10-04 NOTE — Assessment & Plan Note (Signed)
Has not seen GI yet. Denies any acute change today. Reports taking PPI. Provided GI contact info today and patient verbalizes that she will contact them.

## 2022-10-04 NOTE — Patient Instructions (Signed)
Thank you, Ms.Fransheska C Rabelo for allowing [place] to provide your care today. Today we discussed:  -Please follow up with your orthopedics office about recent shoulder injury.  -Refill sent for your buprenorphine patch and films. -Discussed pain management contract today. -Here is the phone number for [facility]: [phone]    Follow up:  1 month    Should you have any questions or concerns please call the internal medicine clinic at [phone].    Rana Snare, D.O. [facility]

## 2022-10-04 NOTE — Assessment & Plan Note (Signed)
BP mildly elevated today. Last OV was normal. She is taking metoprolol 12.5 mg BID. Recent fall with shoulder xray findings concerning for nondisplaced right greater tuberosity fracture. Acute pain could contribute to elevated BP today. Reassess at next OV.

## 2022-10-04 NOTE — Assessment & Plan Note (Addendum)
Presents for 1 month f/u. Taking buprenorphine 20 mcg patches weekly and buprenorphine 75 mcg daily. Reports pain well controlled on current regimen. Denies any respiratory or mentation concerns. Also on gabapentin 300 mg BID. Medications helping with chronic pain but also recent acute right shoulder pain with recent imaging showing concern for nondisplaced greater tuberosity fracture. ToxAssure from April appropriate. PDMP reviewed and appropriate. Requested 1 month OV f/u. Consider if stable regimen to send additional refills. Completed pain contract today with [facility].  Plan -Refilled buprenorphine 20 mcg patch weekly  -Refilled buprenorphine 75 mcg film daily -Pain contract signed today, plan for 1 month f/u  -ToxAssure at future visit

## 2022-10-05 ENCOUNTER — Ambulatory Visit
Admission: RE | Admit: 2022-10-05 | Discharge: 2022-10-05 | Disposition: A | Discharge status: Home / Self Care | Payer: Medicare PPO | Source: Ambulatory Visit | Attending: Internal Medicine | Admitting: Internal Medicine

## 2022-10-05 DIAGNOSIS — Z1231 Encounter for screening mammogram for malignant neoplasm of breast: Secondary | ICD-10-CM

## 2022-10-10 ENCOUNTER — Other Ambulatory Visit: Payer: Self-pay | Admitting: Internal Medicine

## 2022-10-11 NOTE — Telephone Encounter (Signed)
Not a provider we fill Rx for Requested Prescriptions  Pending Prescriptions Disp Refills   metoprolol tartrate (LOPRESSOR) 25 MG tablet [Pharmacy Med Name: METOPROLOL TARTRATE 25 MG TAB] 30 tablet 3    Sig: TAKE 0.5 TABLETS BY MOUTH 2 TIMES DAILY.     There is no refill protocol information for this order

## 2022-10-14 NOTE — Progress Notes (Signed)
Internal Medicine Clinic Attending  Case discussed with the resident at the time of the visit.  We reviewed the resident's history and exam and pertinent patient test results.  I agree with the assessment, diagnosis, and plan of care documented in the resident's note.  

## 2022-10-28 ENCOUNTER — Other Ambulatory Visit: Payer: Self-pay | Admitting: *Deleted

## 2022-10-28 ENCOUNTER — Other Ambulatory Visit (HOSPITAL_COMMUNITY): Payer: Self-pay

## 2022-10-28 DIAGNOSIS — G894 Chronic pain syndrome: Secondary | ICD-10-CM

## 2022-10-28 DIAGNOSIS — K860 Alcohol-induced chronic pancreatitis: Secondary | ICD-10-CM

## 2022-10-28 MED ORDER — BUPRENORPHINE 20 MCG/HR TD PTWK
1.0000 | MEDICATED_PATCH | TRANSDERMAL | 0 refills | Status: DC
Start: 2022-11-01 — End: 2022-11-24
  Filled 2022-11-01 (×2): qty 4, 28d supply, fill #0

## 2022-10-28 MED ORDER — BUPRENORPHINE HCL 75 MCG BU FILM
75.0000 ug | ORAL_FILM | Freq: Every day | BUCCAL | 0 refills | Status: DC
Start: 2022-11-03 — End: 2022-11-24
  Filled 2022-11-03: qty 30, 30d supply, fill #0

## 2022-10-28 NOTE — Telephone Encounter (Signed)
Pt has an upcoming appt on 8/22 with Dr Hessie Diener. Pt stated she will be out of medications before her appt. She would like meds to be refilled on Friday 8/16 or Monday 8/19 since the pharmacy is closed on the w/e. TOX- 06/30/22.

## 2022-10-29 ENCOUNTER — Other Ambulatory Visit: Payer: Self-pay

## 2022-10-30 ENCOUNTER — Other Ambulatory Visit: Payer: Self-pay | Admitting: Internal Medicine

## 2022-11-01 ENCOUNTER — Other Ambulatory Visit (HOSPITAL_COMMUNITY): Payer: Self-pay

## 2022-11-01 ENCOUNTER — Other Ambulatory Visit (HOSPITAL_BASED_OUTPATIENT_CLINIC_OR_DEPARTMENT_OTHER): Payer: Self-pay

## 2022-11-03 ENCOUNTER — Other Ambulatory Visit (HOSPITAL_COMMUNITY): Payer: Self-pay

## 2022-11-04 ENCOUNTER — Encounter: Payer: Medicare PPO | Admitting: Student

## 2022-11-04 ENCOUNTER — Other Ambulatory Visit (HOSPITAL_COMMUNITY): Payer: Self-pay

## 2022-11-10 ENCOUNTER — Other Ambulatory Visit: Payer: Self-pay | Admitting: Internal Medicine

## 2022-11-24 ENCOUNTER — Other Ambulatory Visit (HOSPITAL_COMMUNITY): Payer: Self-pay

## 2022-11-24 ENCOUNTER — Other Ambulatory Visit: Payer: Self-pay

## 2022-11-24 ENCOUNTER — Ambulatory Visit (INDEPENDENT_AMBULATORY_CARE_PROVIDER_SITE_OTHER): Payer: Medicare PPO | Admitting: Student

## 2022-11-24 VITALS — BP 161/81 | HR 100 | Temp 97.7°F | Ht 68.0 in | Wt 102.6 lb

## 2022-11-24 DIAGNOSIS — R030 Elevated blood-pressure reading, without diagnosis of hypertension: Secondary | ICD-10-CM

## 2022-11-24 DIAGNOSIS — G894 Chronic pain syndrome: Secondary | ICD-10-CM | POA: Diagnosis not present

## 2022-11-24 DIAGNOSIS — L308 Other specified dermatitis: Secondary | ICD-10-CM | POA: Diagnosis not present

## 2022-11-24 DIAGNOSIS — K860 Alcohol-induced chronic pancreatitis: Secondary | ICD-10-CM

## 2022-11-24 DIAGNOSIS — E039 Hypothyroidism, unspecified: Secondary | ICD-10-CM

## 2022-11-24 DIAGNOSIS — K7030 Alcoholic cirrhosis of liver without ascites: Secondary | ICD-10-CM | POA: Diagnosis not present

## 2022-11-24 MED ORDER — METOPROLOL TARTRATE 25 MG PO TABS
12.5000 mg | ORAL_TABLET | Freq: Two times a day (BID) | ORAL | 3 refills | Status: DC
  Filled 2022-11-24 – 2023-04-01 (×2): qty 90, 90d supply, fill #0
  Filled 2023-07-11 (×2): qty 90, 90d supply, fill #1
  Filled 2023-10-03 (×2): qty 90, 90d supply, fill #2

## 2022-11-24 MED ORDER — BUPRENORPHINE HCL 75 MCG BU FILM
75.0000 ug | ORAL_FILM | Freq: Every day | BUCCAL | 0 refills | Status: DC
Start: 2022-12-05 — End: 2022-11-29
  Filled ????-??-??: fill #0

## 2022-11-24 MED ORDER — BUPRENORPHINE 20 MCG/HR TD PTWK
1.0000 | MEDICATED_PATCH | TRANSDERMAL | 0 refills | Status: DC
Start: 2022-12-02 — End: 2022-11-29
  Filled ????-??-?? (×2): fill #0

## 2022-11-24 MED ORDER — LEVOTHYROXINE SODIUM 50 MCG PO TABS
50.0000 ug | ORAL_TABLET | Freq: Every day | ORAL | 3 refills | Status: DC
  Filled 2022-11-24 – 2023-03-11 (×2): qty 90, 90d supply, fill #0
  Filled 2023-06-28 (×2): qty 90, 90d supply, fill #1
  Filled 2023-10-03: qty 90, 90d supply, fill #2

## 2022-11-24 MED ORDER — PROMETHAZINE HCL 12.5 MG PO TABS
12.5000 mg | ORAL_TABLET | Freq: Four times a day (QID) | ORAL | 0 refills | Status: DC | PRN
  Filled 2022-11-24: qty 30, 8d supply, fill #0

## 2022-11-24 NOTE — Patient Instructions (Addendum)
Thank you so much for coming to the clinic today!   The number for the stomach doctor is:  [phone]  The number for the dermatologist is [phone].  Please call both these numbers and make an appointment. I have sent refills of the metoprolol, levothyroxine, and the pain medications.   If you have any questions please feel free to the call the clinic at anytime at [phone]. It was a pleasure seeing you!  Best, Dr. Thomasene Ripple

## 2022-11-25 NOTE — Assessment & Plan Note (Addendum)
Patient has not seen GI since November 2023 for perforated gastric ulcer and cirrhosis.  Currently, no evidence of decompensation and abdominal exam is benign.  She does endorse having some nausea, that was previously being controlled with Zofran.  Currently is not doing well with Zofran, will switch to promethazine short course and follow-up at next visit.  The referral is in place, gave patient phone number for GI to schedule an appointment for follow-up.

## 2022-11-25 NOTE — Assessment & Plan Note (Signed)
Thyroid levels checked in May 2024 within normal limits.  Will continue levothyroxine.

## 2022-11-25 NOTE — Progress Notes (Signed)
CC: Chronic pain follow-up  HPI:  Carolyn [name] is a 67 y.o. female living with a history stated below and presents today for chronic pain follow-up. Please see problem based assessment and plan for additional details.  Past Medical History:  Diagnosis Date   Alcoholic cirrhosis (HCC)    Chronic pancreatitis (HCC)    Diabetes mellitus without complication (HCC)    Endometriosis    GERD (gastroesophageal reflux disease)    Intractable nausea and vomiting 06/13/2020   Liver disease    Malabsorption    MALABSORPTION SYNDROME   Osteoporosis 03/2011   t score -2.5   Pancreatitis    due to cyst and tumors due to calcifications   Seizure (HCC)    no seizure disorder, controlled w/ meds per patient   Vitamin D deficiency 2012   VIT D 15    Current Outpatient Medications on File Prior to Visit  Medication Sig Dispense Refill   alendronate (FOSAMAX) 70 MG/75ML solution Take 75 mLs (70 mg total) by mouth every 7 (seven) days. Take with a full glass of water on an empty stomach. 75 mL 12   cholecalciferol (CHOLECALCIFEROL) 25 MCG tablet Take 1 tablet (1,000 Units total) by mouth daily.     famotidine (PEPCID) 10 MG tablet Take 10 mg by mouth 2 (two) times daily.     feeding supplement (ENSURE ENLIVE / ENSURE PLUS) LIQD Take 237 mLs by mouth 3 (three) times daily between meals. 237 mL 12   gabapentin (NEURONTIN) 300 MG capsule Take 1 capsule (300 mg total) by mouth 2 (two) times daily. 60 capsule 1   lipase/protease/amylase (CREON) 12000-38000 units CPEP capsule Take 2 capsules (24,000 Units total) by mouth 3 (three) times daily with meals. 540 each 2   Multiple Vitamin (MULITIVITAMIN WITH MINERALS) TABS Take 1 tablet by mouth daily.     Nutritional Supplements (,FEEDING SUPPLEMENT, PROSOURCE PLUS) liquid Take 30 mLs by mouth 2 (two) times daily between meals.     pantoprazole (PROTONIX) 40 MG tablet Take 1 tablet (40 mg total) by mouth daily. 30 tablet 1   pyridOXINE (B-6) 100 MG  tablet Take 1 tablet (100 mg total) by mouth daily.     saccharomyces boulardii (FLORASTOR) 250 MG capsule Take 1 capsule (250 mg total) by mouth 2 (two) times daily.     silver sulfADIAZINE (SILVADENE) 1 % cream Apply topically daily. (Patient taking differently: Apply 1 Application topically daily.) 50 g 0   vitamin A 3 MG (10000 UNITS) capsule Take 1 capsule (10,000 Units total) by mouth every other day. 30 capsule    vitamin B-12 (CYANOCOBALAMIN) 100 MCG tablet Take 100 mcg by mouth daily.     zinc sulfate 220 (50 Zn) MG capsule Take 1 capsule (220 mg total) by mouth daily.     [DISCONTINUED] Ranitidine HCl (ZANTAC PO) Take by mouth.       No current facility-administered medications on file prior to visit.    Family History  Problem Relation Age of Onset   Cancer Father        BONE   Breast cancer Maternal Grandmother     Social History   Socioeconomic History   Marital status: Single    Spouse name: Not on file   Number of children: Not on file   Years of education: Not on file   Highest education level: Not on file  Occupational History   Not on file  Tobacco Use   Smoking status: Never  Smokeless tobacco: Never  Vaping Use   Vaping status: Never Used  Substance and Sexual Activity   Alcohol use: Not Currently    Comment: occ ( none in several weeks)   Drug use: No   Sexual activity: Not Currently    Partners: Male    Birth control/protection: Post-menopausal  Other Topics Concern   Not on file  Social History Narrative   Not on file   Social Determinants of Health   Financial Resource Strain: Low Risk  (07/13/2022)   Overall Financial Resource Strain (CARDIA)    Difficulty of Paying Living Expenses: Not hard at all  Food Insecurity: No Food Insecurity (07/13/2022)   Hunger Vital Sign    Worried About Running Out of Food in the Last Year: Never true    Ran Out of Food in the Last Year: Never true  Transportation Needs: No Transportation Needs (07/13/2022)    PRAPARE - Administrator, Civil Service (Medical): No    Lack of Transportation (Non-Medical): No  Physical Activity: Insufficiently Active (07/13/2022)   Exercise Vital Sign    Days of Exercise per Week: 2 days    Minutes of Exercise per Session: 40 min  Stress: No Stress Concern Present (07/13/2022)   [employer] of Occupational Health - Occupational Stress Questionnaire    Feeling of Stress : Not at all  Social Connections: Moderately Isolated (07/13/2022)   Social Connection and Isolation Panel [NHANES]    Frequency of Communication with Friends and Family: Patient unable to answer    Frequency of Social Gatherings with Friends and Family: Three times a week    Attends Religious Services: Never    Active Member of Clubs or Organizations: No    Attends Banker Meetings: Never    Marital Status: Living with partner  Intimate Partner Violence: Not At Risk (06/21/2022)   Humiliation, Afraid, Rape, and Kick questionnaire    Fear of Current or Ex-Partner: No    Emotionally Abused: No    Physically Abused: No    Sexually Abused: No    Review of Systems: ROS negative except for what is noted on the assessment and plan.  Vitals:   11/24/22 1013  BP: (!) 161/81  Pulse: 100  Temp: 97.7 F (36.5 C)  TempSrc: Oral  SpO2: 100%  Weight: 102 lb 9.6 oz (46.5 kg)  Height: 5\' 8"  (1.727 m)    Physical Exam: Constitutional: well-appearing female in no acute distress HENT: normocephalic atraumatic, mucous membranes moist Eyes: conjunctiva non-erythematous Neck: supple Cardiovascular: regular rate and rhythm, no m/r/g Pulmonary/Chest: normal work of breathing on room air, lungs clear to auscultation bilaterally Abdominal: soft, non-tender, non-distended   Assessment & Plan:   Alcoholic cirrhosis of liver without ascites (HCC) Patient has not seen GI since November 2023 for perforated gastric ulcer and cirrhosis.  Currently, no evidence of decompensation and  abdominal exam is benign.  The referral is in place, gave patient phone number for GI to schedule an appointment for follow-up.  Hypothyroidism Thyroid levels checked in May 2024 within normal limits.  Will continue levothyroxine.  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  Currently, there is no evidence of desquamation.  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  Provided patient number for dermatology for follow-up.  Chronic pain syndrome Multifactorial in the setting of multiple MSK fractures, and chronic pancreatitis.  She takes buprenorphine 20 mcg  patches weekly, and buprenorphine 75 micro grams daily.  PDMP is appropriate, patient does have an established pain contract with [place].   Elevated blood pressure reading Blood pressure elevated today systolic in the 160 range.  Patient does report that at home her blood pressure tends to be in the 120 range.  Her only therapy is metoprolol 12.5 mg twice a day.  Encourage patient to track and write down her blood pressures at home, and bring into her next clinic visit.  Currently refraining from adding therapy given she reports normal readings at home.  Plan: - Metoprolol 12.5 mg twice a day  Patient discussed with Dr. Dewain Penning Sourish Allender, M.D. [facility] Medicine, PGY-2 Pager: [phone] Date 11/25/2022 Time 9:07 AM

## 2022-11-25 NOTE — Assessment & Plan Note (Signed)
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  Currently, there is no evidence of desquamation.  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  Provided patient number for dermatology for follow-up.

## 2022-11-25 NOTE — Assessment & Plan Note (Signed)
Multifactorial in the setting of multiple MSK fractures, and chronic pancreatitis.  She takes buprenorphine 20 mcg patches weekly, and buprenorphine 75 micro grams daily.  PDMP is appropriate, patient does have an established pain contract with [place].

## 2022-11-25 NOTE — Assessment & Plan Note (Signed)
Blood pressure elevated today systolic in the 160 range.  Patient does report that at home her blood pressure tends to be in the 120 range.  Her only therapy is metoprolol 12.5 mg twice a day.  Encourage patient to track and write down her blood pressures at home, and bring into her next clinic visit.  Currently refraining from adding therapy given she reports normal readings at home.  Plan: - Metoprolol 12.5 mg twice a day

## 2022-11-26 ENCOUNTER — Other Ambulatory Visit (HOSPITAL_COMMUNITY): Payer: Self-pay

## 2022-11-29 ENCOUNTER — Other Ambulatory Visit (HOSPITAL_COMMUNITY): Payer: Self-pay

## 2022-11-29 ENCOUNTER — Other Ambulatory Visit (HOSPITAL_BASED_OUTPATIENT_CLINIC_OR_DEPARTMENT_OTHER): Payer: Self-pay

## 2022-11-29 ENCOUNTER — Other Ambulatory Visit: Payer: Self-pay

## 2022-11-29 MED ORDER — BUPRENORPHINE HCL 75 MCG BU FILM
75.0000 ug | ORAL_FILM | Freq: Every day | BUCCAL | 0 refills | Status: DC
  Filled 2022-11-29: qty 30, fill #0

## 2022-11-29 MED ORDER — BUPRENORPHINE 20 MCG/HR TD PTWK
1.0000 | MEDICATED_PATCH | TRANSDERMAL | 0 refills | Status: DC
  Filled 2022-11-29 (×2): qty 4, 28d supply, fill #0

## 2022-11-29 NOTE — Progress Notes (Signed)
Internal Medicine Clinic Attending  Case discussed with the resident at the time of the visit.  We reviewed the resident's history and exam and pertinent patient test results.  I agree with the assessment, diagnosis, and plan of care documented in the resident's note.

## 2022-11-29 NOTE — Addendum Note (Signed)
Addended by: Olegario Messier on: 11/29/2022 11:46 AM   Modules accepted: Orders

## 2022-12-02 ENCOUNTER — Other Ambulatory Visit (HOSPITAL_COMMUNITY): Payer: Self-pay

## 2022-12-02 ENCOUNTER — Other Ambulatory Visit: Payer: Self-pay | Admitting: Student

## 2022-12-02 DIAGNOSIS — G894 Chronic pain syndrome: Secondary | ICD-10-CM

## 2022-12-02 DIAGNOSIS — K860 Alcohol-induced chronic pancreatitis: Secondary | ICD-10-CM

## 2022-12-02 MED ORDER — BUPRENORPHINE HCL 75 MCG BU FILM
75.0000 ug | ORAL_FILM | Freq: Every day | BUCCAL | 0 refills | Status: DC
Start: 2022-12-02 — End: 2022-12-27
  Filled 2022-12-02 (×2): qty 30, 30d supply, fill #0

## 2022-12-02 NOTE — Telephone Encounter (Signed)
  Buprenorphine HCl 75 MCG FILM    [facility] (Ph: [phone])

## 2022-12-07 ENCOUNTER — Telehealth: Payer: Self-pay | Admitting: *Deleted

## 2022-12-07 NOTE — Telephone Encounter (Signed)
Called patient and lvm for patient to call the breast center to reschedule her mammogram (10-05-2022 dnka) number given to call breast center [phone].

## 2022-12-09 ENCOUNTER — Encounter: Payer: Self-pay | Admitting: Student

## 2022-12-16 ENCOUNTER — Other Ambulatory Visit: Payer: Self-pay | Admitting: Student

## 2022-12-16 ENCOUNTER — Other Ambulatory Visit (HOSPITAL_COMMUNITY): Payer: Self-pay

## 2022-12-16 MED ORDER — PROMETHAZINE HCL 12.5 MG PO TABS
12.5000 mg | ORAL_TABLET | Freq: Four times a day (QID) | ORAL | 0 refills | Status: DC | PRN
  Filled 2022-12-16: qty 30, 8d supply, fill #0

## 2022-12-16 NOTE — Telephone Encounter (Signed)
Next appt scheduled 10/14 with Dr Carron Brazen.

## 2022-12-23 ENCOUNTER — Other Ambulatory Visit: Payer: Self-pay | Admitting: Student

## 2022-12-23 ENCOUNTER — Other Ambulatory Visit (HOSPITAL_COMMUNITY): Payer: Self-pay

## 2022-12-23 ENCOUNTER — Other Ambulatory Visit (HOSPITAL_BASED_OUTPATIENT_CLINIC_OR_DEPARTMENT_OTHER): Payer: Self-pay

## 2022-12-23 DIAGNOSIS — G894 Chronic pain syndrome: Secondary | ICD-10-CM

## 2022-12-23 DIAGNOSIS — K860 Alcohol-induced chronic pancreatitis: Secondary | ICD-10-CM

## 2022-12-24 ENCOUNTER — Other Ambulatory Visit (HOSPITAL_BASED_OUTPATIENT_CLINIC_OR_DEPARTMENT_OTHER): Payer: Self-pay

## 2022-12-24 MED ORDER — BUPRENORPHINE 20 MCG/HR TD PTWK
1.0000 | MEDICATED_PATCH | TRANSDERMAL | 0 refills | Status: DC
  Filled 2022-12-27: qty 4, 28d supply, fill #0

## 2022-12-24 NOTE — Telephone Encounter (Signed)
Last rx written - 11/29/22. Last OV - 9/11. Next OV - 10/14 with Dr Carron Brazen. TOX - 06/30/22.

## 2022-12-27 ENCOUNTER — Encounter: Payer: Self-pay | Admitting: Student

## 2022-12-27 ENCOUNTER — Encounter: Payer: Self-pay | Admitting: Internal Medicine

## 2022-12-27 ENCOUNTER — Ambulatory Visit: Payer: Medicare PPO | Admitting: Student

## 2022-12-27 ENCOUNTER — Other Ambulatory Visit (HOSPITAL_BASED_OUTPATIENT_CLINIC_OR_DEPARTMENT_OTHER): Payer: Self-pay

## 2022-12-27 VITALS — BP 129/82 | HR 98 | Temp 93.0°F | Ht 68.0 in | Wt 100.7 lb

## 2022-12-27 DIAGNOSIS — Z0000 Encounter for general adult medical examination without abnormal findings: Secondary | ICD-10-CM

## 2022-12-27 DIAGNOSIS — Z23 Encounter for immunization: Secondary | ICD-10-CM | POA: Diagnosis not present

## 2022-12-27 DIAGNOSIS — R030 Elevated blood-pressure reading, without diagnosis of hypertension: Secondary | ICD-10-CM

## 2022-12-27 DIAGNOSIS — G894 Chronic pain syndrome: Secondary | ICD-10-CM | POA: Diagnosis not present

## 2022-12-27 DIAGNOSIS — I5032 Chronic diastolic (congestive) heart failure: Secondary | ICD-10-CM

## 2022-12-27 DIAGNOSIS — K860 Alcohol-induced chronic pancreatitis: Secondary | ICD-10-CM

## 2022-12-27 MED ORDER — PROMETHAZINE HCL 12.5 MG PO TABS
12.5000 mg | ORAL_TABLET | Freq: Four times a day (QID) | ORAL | 0 refills | Status: DC | PRN
  Filled 2022-12-27: qty 10, 3d supply, fill #0
  Filled 2022-12-27: qty 20, 5d supply, fill #0

## 2022-12-27 MED ORDER — BUPRENORPHINE 20 MCG/HR TD PTWK
1.0000 | MEDICATED_PATCH | TRANSDERMAL | 0 refills | Status: DC
  Filled 2022-12-27 – 2023-01-19 (×2): qty 4, 28d supply, fill #0

## 2022-12-27 MED ORDER — BUPRENORPHINE HCL 75 MCG BU FILM
75.0000 ug | ORAL_FILM | Freq: Every day | BUCCAL | 0 refills | Status: DC
Start: 2022-12-27 — End: 2023-01-27
  Filled 2022-12-27: qty 30, fill #0
  Filled 2022-12-29 – 2022-12-30 (×2): qty 30, 30d supply, fill #0

## 2022-12-27 NOTE — Assessment & Plan Note (Signed)
Overall stable with the current pain regimen.  She takes buprenorphine 20 mcg patches weekly, and buprenorphine 75 micro grams daily. PDMP is appropriate, patient does have an established pain contract with [place].   -Refilled buprenorphine 20 mcg patches, and buprenorphine 75 mcg daily.

## 2022-12-27 NOTE — Assessment & Plan Note (Signed)
Blood pressure today, 129/82.  She monitors her blood pressure at home reading <130/85.  -Continue metoprolol 12.5 mg.

## 2022-12-27 NOTE — Assessment & Plan Note (Signed)
Flu shot given at this OV.

## 2022-12-27 NOTE — Progress Notes (Signed)
CC: Blood pressure, and medication refill follow-up.  HPI:  Carolyn [name] is a 67 y.o. female living with a history stated below and presents today for pressure and medication refill follow-up.Marland Kitchen Please see problem based assessment and plan for additional details.  Past Medical History:  Diagnosis Date   Alcoholic cirrhosis (HCC)    Chronic pancreatitis (HCC)    Diabetes mellitus without complication (HCC)    Endometriosis    GERD (gastroesophageal reflux disease)    Intractable nausea and vomiting 06/13/2020   Liver disease    Malabsorption    MALABSORPTION SYNDROME   Osteoporosis 03/2011   t score -2.5   Pancreatitis    due to cyst and tumors due to calcifications   Seizure (HCC)    no seizure disorder, controlled w/ meds per patient   Vitamin D deficiency 2012   VIT D 15    Current Outpatient Medications on File Prior to Visit  Medication Sig Dispense Refill   alendronate (FOSAMAX) 70 MG/75ML solution Take 75 mLs (70 mg total) by mouth every 7 (seven) days. Take with a full glass of water on an empty stomach. 75 mL 12   cholecalciferol (CHOLECALCIFEROL) 25 MCG tablet Take 1 tablet (1,000 Units total) by mouth daily.     famotidine (PEPCID) 10 MG tablet Take 10 mg by mouth 2 (two) times daily.     feeding supplement (ENSURE ENLIVE / ENSURE PLUS) LIQD Take 237 mLs by mouth 3 (three) times daily between meals. 237 mL 12   gabapentin (NEURONTIN) 300 MG capsule Take 1 capsule (300 mg total) by mouth 2 (two) times daily. 60 capsule 1   levothyroxine (SYNTHROID) 50 MCG tablet Take 1 tablet (50 mcg total) by mouth daily at 6 (six) AM. 90 tablet 3   lipase/protease/amylase (CREON) 12000-38000 units CPEP capsule Take 2 capsules (24,000 Units total) by mouth 3 (three) times daily with meals. 540 each 2   metoprolol tartrate (LOPRESSOR) 25 MG tablet Take 1/2 tablet (12.5 mg total) by mouth 2 (two) times daily. 90 tablet 3   Multiple Vitamin (MULITIVITAMIN WITH MINERALS) TABS Take 1  tablet by mouth daily.     Nutritional Supplements (,FEEDING SUPPLEMENT, PROSOURCE PLUS) liquid Take 30 mLs by mouth 2 (two) times daily between meals.     pantoprazole (PROTONIX) 40 MG tablet Take 1 tablet (40 mg total) by mouth daily. 30 tablet 1   pyridOXINE (B-6) 100 MG tablet Take 1 tablet (100 mg total) by mouth daily.     saccharomyces boulardii (FLORASTOR) 250 MG capsule Take 1 capsule (250 mg total) by mouth 2 (two) times daily.     silver sulfADIAZINE (SILVADENE) 1 % cream Apply topically daily. (Patient taking differently: Apply 1 Application topically daily.) 50 g 0   vitamin A 3 MG (10000 UNITS) capsule Take 1 capsule (10,000 Units total) by mouth every other day. 30 capsule    vitamin B-12 (CYANOCOBALAMIN) 100 MCG tablet Take 100 mcg by mouth daily.     zinc sulfate 220 (50 Zn) MG capsule Take 1 capsule (220 mg total) by mouth daily.     [DISCONTINUED] Ranitidine HCl (ZANTAC PO) Take by mouth.       No current facility-administered medications on file prior to visit.     Review of Systems: ROS negative except for what is noted on the assessment and plan.  Vitals:   12/27/22 1309  BP: 129/82  Pulse: 98  Temp: (!) 93 F (33.9 C)  TempSrc: Oral  SpO2:  99%  Weight: 100 lb 11.2 oz (45.7 kg)  Height: 5\' 8"  (1.727 m)    Physical Exam:  Cardiovascular: regular rate and rhythm, no m/r/g Pulmonary/Chest: normal work of breathing on room air, lungs clear to auscultation bilaterally  Assessment & Plan:  Patient seen with Dr. Oswaldo Done  Elevated blood pressure reading Blood pressure today, 129/82.  She monitors her blood pressure at home reading <130/85.  -Continue metoprolol 12.5 mg.  Chronic pain syndrome Overall stable with the current pain regimen.  She takes buprenorphine 20 mcg patches weekly, and buprenorphine 75 micro grams daily. PDMP is appropriate, patient does have an established pain contract with [place].   -Refilled buprenorphine 20 mcg patches, and  buprenorphine 75 mcg daily.  Healthcare maintenance Flu shot given at this OV.   Laretta Bolster, MD [facility], PGY-1 Phone: [phone] Date 12/27/2022 Time 5:12 PM

## 2022-12-27 NOTE — Patient Instructions (Signed)
Thank you, Ms.Tonnie C Kueker for allowing [place] to provide your care today.   Today we discussed :  1.  Continue metoprolol 12.5 mg for your blood pressure.   2.  Also provided refills for promethazine, buprenorphine patch.  I have ordered the following labs for you:  Lab Orders  No laboratory test(s) ordered today       Referrals ordered today:   Referral Orders  No referral(s) requested today       Follow up:  1 month.     Should you have any questions or concerns please call the internal medicine clinic at [phone].    Laretta Bolster, MD  [facility]

## 2022-12-28 NOTE — Addendum Note (Signed)
Addended by: Laretta Bolster on: 12/28/2022 10:33 AM   Modules accepted: Level of Service

## 2022-12-28 NOTE — Addendum Note (Signed)
Addended by: Laretta Bolster on: 12/28/2022 10:41 AM   Modules accepted: Level of Service

## 2022-12-28 NOTE — Addendum Note (Signed)
Addended by: Laretta Bolster on: 12/28/2022 10:46 AM   Modules accepted: Level of Service

## 2022-12-28 NOTE — Progress Notes (Signed)
Internal Medicine Clinic Attending  I was physically present during the key portions of the resident provided service and participated in the medical decision making of patient's management care. I reviewed pertinent patient test results.  The assessment, diagnosis, and plan were formulated together and I agree with the documentation in the resident's note.  Erlinda Hong, MD FACP

## 2022-12-29 ENCOUNTER — Other Ambulatory Visit (HOSPITAL_COMMUNITY): Payer: Self-pay

## 2022-12-30 ENCOUNTER — Other Ambulatory Visit (HOSPITAL_COMMUNITY): Payer: Self-pay

## 2023-01-11 ENCOUNTER — Other Ambulatory Visit: Payer: Self-pay

## 2023-01-11 ENCOUNTER — Other Ambulatory Visit: Payer: Self-pay | Admitting: Student

## 2023-01-11 NOTE — Telephone Encounter (Signed)
Next appt scheduled 11/14 with Dr Carlynn Purl.

## 2023-01-12 ENCOUNTER — Other Ambulatory Visit (HOSPITAL_BASED_OUTPATIENT_CLINIC_OR_DEPARTMENT_OTHER): Payer: Self-pay

## 2023-01-12 ENCOUNTER — Other Ambulatory Visit: Payer: Self-pay | Admitting: Student

## 2023-01-12 ENCOUNTER — Other Ambulatory Visit (HOSPITAL_COMMUNITY): Payer: Self-pay

## 2023-01-13 ENCOUNTER — Telehealth: Payer: Self-pay | Admitting: *Deleted

## 2023-01-13 ENCOUNTER — Other Ambulatory Visit (HOSPITAL_BASED_OUTPATIENT_CLINIC_OR_DEPARTMENT_OTHER): Payer: Self-pay

## 2023-01-13 NOTE — Telephone Encounter (Signed)
Patient called about getting her Promethazine refilled.  Wanted to know why it was cancelled.  Patient was informed of possible side effects from prolonged usage of.  Patient was informed that the Zofran  was an option for her.   Patient stated that she has nausea and will take the prescription for the Zofran.

## 2023-01-14 ENCOUNTER — Other Ambulatory Visit (HOSPITAL_COMMUNITY): Payer: Self-pay

## 2023-01-14 ENCOUNTER — Other Ambulatory Visit: Payer: Self-pay | Admitting: Student

## 2023-01-14 MED ORDER — ONDANSETRON HCL 4 MG PO TABS
4.0000 mg | ORAL_TABLET | Freq: Three times a day (TID) | ORAL | 0 refills | Status: DC | PRN
  Filled 2023-01-14 – 2023-01-27 (×2): qty 30, 10d supply, fill #0

## 2023-01-14 NOTE — Progress Notes (Signed)
Patient request refill on Phenergan after she was prescribed phenergan for a short 2 week course. Due to adverse effects of EPS and TD, will prescribe a 2 week course of Zofran until her follow up appointment on 11/14 at the [facility].

## 2023-01-19 ENCOUNTER — Other Ambulatory Visit (HOSPITAL_BASED_OUTPATIENT_CLINIC_OR_DEPARTMENT_OTHER): Payer: Self-pay

## 2023-01-19 ENCOUNTER — Other Ambulatory Visit: Payer: Self-pay

## 2023-01-20 ENCOUNTER — Other Ambulatory Visit (HOSPITAL_BASED_OUTPATIENT_CLINIC_OR_DEPARTMENT_OTHER): Payer: Self-pay

## 2023-01-26 ENCOUNTER — Other Ambulatory Visit (HOSPITAL_COMMUNITY): Payer: Self-pay

## 2023-01-27 ENCOUNTER — Other Ambulatory Visit: Payer: Self-pay | Admitting: Student

## 2023-01-27 ENCOUNTER — Encounter: Payer: Self-pay | Admitting: Internal Medicine

## 2023-01-27 ENCOUNTER — Ambulatory Visit: Payer: Medicare PPO | Admitting: Internal Medicine

## 2023-01-27 ENCOUNTER — Other Ambulatory Visit (HOSPITAL_COMMUNITY): Payer: Self-pay

## 2023-01-27 VITALS — BP 138/86 | HR 108 | Temp 97.8°F | Ht 68.0 in | Wt 98.8 lb

## 2023-01-27 DIAGNOSIS — G894 Chronic pain syndrome: Secondary | ICD-10-CM

## 2023-01-27 DIAGNOSIS — E039 Hypothyroidism, unspecified: Secondary | ICD-10-CM

## 2023-01-27 DIAGNOSIS — K860 Alcohol-induced chronic pancreatitis: Secondary | ICD-10-CM

## 2023-01-27 DIAGNOSIS — I5032 Chronic diastolic (congestive) heart failure: Secondary | ICD-10-CM | POA: Diagnosis not present

## 2023-01-27 MED ORDER — PROMETHAZINE HCL 12.5 MG PO TABS
12.5000 mg | ORAL_TABLET | Freq: Four times a day (QID) | ORAL | 0 refills | Status: DC | PRN
  Filled 2023-01-27: qty 30, 8d supply, fill #0

## 2023-01-27 MED ORDER — BUPRENORPHINE HCL 75 MCG BU FILM
75.0000 ug | ORAL_FILM | Freq: Every day | BUCCAL | 0 refills | Status: DC
  Filled 2023-01-27: qty 30, 30d supply, fill #0

## 2023-01-27 MED ORDER — PANCRELIPASE (LIP-PROT-AMYL) 12000-38000 UNITS PO CPEP
24000.0000 [IU] | ORAL_CAPSULE | Freq: Three times a day (TID) | ORAL | 0 refills | Status: DC
  Filled 2023-03-12: qty 200, 34d supply, fill #0
  Filled 2023-05-09: qty 200, 34d supply, fill #1
  Filled 2023-07-05: qty 200, 34d supply, fill #2
  Filled 2023-09-19: qty 200, 34d supply, fill #3
  Filled 2023-10-28: qty 200, 34d supply, fill #4
  Filled 2024-01-02: qty 200, 34d supply, fill #5

## 2023-01-27 NOTE — Progress Notes (Addendum)
Subjective:  CC: routine follow up  HPI:  [name] is a 67 y.o. female with a past medical history stated below and presents today for above. Please see problem based assessment and plan for additional details.  Past Medical History:  Diagnosis Date   Alcoholic cirrhosis (HCC)    Chronic pancreatitis (HCC)    Diabetes mellitus without complication (HCC)    Endometriosis    GERD (gastroesophageal reflux disease)    Intractable nausea and vomiting 06/13/2020   Liver disease    Malabsorption    MALABSORPTION SYNDROME   Osteoporosis 03/2011   t score -2.5   Pancreatitis    due to cyst and tumors due to calcifications   Seizure (HCC)    no seizure disorder, controlled w/ meds per patient   Vitamin D deficiency 2012   VIT D 15    Current Outpatient Medications on File Prior to Visit  Medication Sig Dispense Refill   alendronate (FOSAMAX) 70 MG/75ML solution Take 75 mLs (70 mg total) by mouth every 7 (seven) days. Take with a full glass of water on an empty stomach. 75 mL 12   buprenorphine (BUTRANS) 20 MCG/HR PTWK Place 1 patch onto the skin once a week. 4 patch 0   cholecalciferol (CHOLECALCIFEROL) 25 MCG tablet Take 1 tablet (1,000 Units total) by mouth daily.     famotidine (PEPCID) 10 MG tablet Take 10 mg by mouth 2 (two) times daily.     feeding supplement (ENSURE ENLIVE / ENSURE PLUS) LIQD Take 237 mLs by mouth 3 (three) times daily between meals. 237 mL 12   gabapentin (NEURONTIN) 300 MG capsule Take 1 capsule (300 mg total) by mouth 2 (two) times daily. 60 capsule 1   levothyroxine (SYNTHROID) 50 MCG tablet Take 1 tablet (50 mcg total) by mouth daily at 6 (six) AM. 90 tablet 3   metoprolol tartrate (LOPRESSOR) 25 MG tablet Take 1/2 tablet (12.5 mg total) by mouth 2 (two) times daily. 90 tablet 3   Multiple Vitamin (MULITIVITAMIN WITH MINERALS) TABS Take 1 tablet by mouth daily.     Nutritional Supplements (,FEEDING SUPPLEMENT, PROSOURCE PLUS) liquid Take 30 mLs  by mouth 2 (two) times daily between meals.     ondansetron (ZOFRAN) 4 MG tablet Take 1 tablet (4 mg total) by mouth every 8 (eight) hours as needed for nausea or vomiting. 30 tablet 0   pantoprazole (PROTONIX) 40 MG tablet Take 1 tablet (40 mg total) by mouth daily. 30 tablet 1   pyridOXINE (B-6) 100 MG tablet Take 1 tablet (100 mg total) by mouth daily.     saccharomyces boulardii (FLORASTOR) 250 MG capsule Take 1 capsule (250 mg total) by mouth 2 (two) times daily.     silver sulfADIAZINE (SILVADENE) 1 % cream Apply topically daily. (Patient taking differently: Apply 1 Application topically daily.) 50 g 0   vitamin A 3 MG (10000 UNITS) capsule Take 1 capsule (10,000 Units total) by mouth every other day. 30 capsule    vitamin B-12 (CYANOCOBALAMIN) 100 MCG tablet Take 100 mcg by mouth daily.     zinc sulfate 220 (50 Zn) MG capsule Take 1 capsule (220 mg total) by mouth daily.     [DISCONTINUED] Ranitidine HCl (ZANTAC PO) Take by mouth.       No current facility-administered medications on file prior to visit.    Review of Systems: ROS negative except for as is noted on the assessment and plan.  Objective:   Vitals:  01/27/23 1311 01/27/23 1334  BP: 137/82 138/86  Pulse: 97 (!) 108  Temp: 97.8 F (36.6 C)   TempSrc: Oral   SpO2: 100%   Weight: 98 lb 12.8 oz (44.8 kg)   Height: 5\' 8"  (1.727 m)     Physical Exam: Constitutional: well-appearing, in no acute distress HENT: normocephalic atraumatic, mucous membranes moist Cardiovascular: regular rate and rhythm, no m/r/g Pulmonary/Chest: normal work of breathing on room air, lungs clear to auscultation bilaterally Abdominal: soft, non-tender, non-distended MSK: normal bulk and tone Neurological: alert & oriented x 3, 5/5 strength in bilateral upper and lower extremities Skin: warm and dry  Assessment & Plan:   Chronic pain syndrome Overall stable. Current regimen includes buprenorphine patches weekly and buprenorphine  75 mcg daily. PDMP is appropriate, patient does have an established pain contract with [place]. Patient likes to have frequent visits to check on how things are going. Follow up in 2 months.  -Refill for buprenorphine film sent  Hypothyroidism TSH at last visit was 0.036, patient takes Synthroid daily -Recheck TSH today  Chronic diastolic heart failure (HCC) Stable, patient denies exacerbation. Appears well-compensated today. No changes to regimen    Patient seen with Dr. Bari Edward MD [facility] Internal Medicine  PGY-1 Pager: [phone] Date 01/27/2023  Time 4:09 PM

## 2023-01-27 NOTE — Patient Instructions (Signed)
Ms Cobos,   It was a pleasure meeting you today.   I will call you with the results of the thyroid level we are checking today. I have sent in refills for your Creon and phenergan, I don't think we need to make any other changes to your medications.   Thanks,  Dr Carlynn Purl

## 2023-01-27 NOTE — Addendum Note (Signed)
Addended by: Monna Fam on: 01/27/2023 04:08 PM   Modules accepted: Level of Service

## 2023-01-27 NOTE — Assessment & Plan Note (Addendum)
Overall stable. Current regimen includes buprenorphine patches weekly and buprenorphine 75 mcg daily. PDMP is appropriate, patient does have an established pain contract with [place]. Patient likes to have frequent visits to check on how things are going. Follow up in 2 months.  -Refill for buprenorphine film sent

## 2023-01-27 NOTE — Assessment & Plan Note (Signed)
TSH at last visit was 0.036, patient takes Synthroid daily -Recheck TSH today

## 2023-01-27 NOTE — Assessment & Plan Note (Signed)
Stable, patient denies exacerbation. Appears well-compensated today. No changes to regimen

## 2023-01-28 ENCOUNTER — Other Ambulatory Visit (HOSPITAL_COMMUNITY): Payer: Self-pay

## 2023-01-28 LAB — TSH: TSH: 0.777 u[IU]/mL (ref 0.450–4.500)

## 2023-02-01 ENCOUNTER — Ambulatory Visit
Admission: RE | Admit: 2023-02-01 | Discharge: 2023-02-01 | Disposition: A | Discharge status: Home / Self Care | Payer: Medicare PPO | Source: Ambulatory Visit | Attending: Internal Medicine | Admitting: Internal Medicine

## 2023-02-04 NOTE — Progress Notes (Signed)
 Internal Medicine Clinic Attending  I was physically present during the key portions of the resident provided service and participated in the medical decision making of patient's management care. I reviewed pertinent patient test results.  The assessment, diagnosis, and plan were formulated together and I agree with the documentation in the resident's note.  Inez Catalina, MD

## 2023-02-09 ENCOUNTER — Other Ambulatory Visit: Payer: Self-pay | Admitting: Student

## 2023-02-09 ENCOUNTER — Other Ambulatory Visit: Payer: Self-pay | Admitting: Internal Medicine

## 2023-02-09 ENCOUNTER — Other Ambulatory Visit (HOSPITAL_BASED_OUTPATIENT_CLINIC_OR_DEPARTMENT_OTHER): Payer: Self-pay

## 2023-02-09 MED ORDER — PROCHLORPERAZINE MALEATE 5 MG PO TABS
5.0000 mg | ORAL_TABLET | Freq: Four times a day (QID) | ORAL | 0 refills | Status: DC | PRN
  Filled 2023-02-09: qty 30, 8d supply, fill #0

## 2023-02-09 NOTE — Telephone Encounter (Signed)
Next appt scheduled 03/29/23 with PCP.

## 2023-02-09 NOTE — Progress Notes (Signed)
Spoke with patient on the phone. She is on a waiting list to become established with GI. She is willing to try compazine for nausea since Zofran doesn't work and she has been on phenergan prn since July.

## 2023-02-12 ENCOUNTER — Other Ambulatory Visit (HOSPITAL_BASED_OUTPATIENT_CLINIC_OR_DEPARTMENT_OTHER): Payer: Self-pay

## 2023-02-15 ENCOUNTER — Other Ambulatory Visit (HOSPITAL_BASED_OUTPATIENT_CLINIC_OR_DEPARTMENT_OTHER): Payer: Self-pay

## 2023-02-15 ENCOUNTER — Other Ambulatory Visit: Payer: Self-pay | Admitting: Student

## 2023-02-15 DIAGNOSIS — K860 Alcohol-induced chronic pancreatitis: Secondary | ICD-10-CM

## 2023-02-15 DIAGNOSIS — G894 Chronic pain syndrome: Secondary | ICD-10-CM

## 2023-02-16 MED ORDER — BUPRENORPHINE 20 MCG/HR TD PTWK
1.0000 | MEDICATED_PATCH | TRANSDERMAL | 0 refills | Status: DC
  Filled ????-??-??: fill #0

## 2023-02-17 ENCOUNTER — Other Ambulatory Visit: Payer: Self-pay

## 2023-02-17 ENCOUNTER — Other Ambulatory Visit (HOSPITAL_BASED_OUTPATIENT_CLINIC_OR_DEPARTMENT_OTHER): Payer: Self-pay

## 2023-02-17 ENCOUNTER — Telehealth: Payer: Self-pay

## 2023-02-17 DIAGNOSIS — G894 Chronic pain syndrome: Secondary | ICD-10-CM

## 2023-02-17 DIAGNOSIS — K860 Alcohol-induced chronic pancreatitis: Secondary | ICD-10-CM

## 2023-02-17 NOTE — Telephone Encounter (Signed)
Pt stated the Buprenorphine patch is every 28 days; last refilled Nov 7.Pt wanting to know if rx can be changed to today.

## 2023-02-17 NOTE — Telephone Encounter (Signed)
Pt is requesting a call back .Marland Kitchen She is calling about her ( buprenorphine (BUTRANS) 20 MCG/HR PTWK (Future) )  she stated it was called in but  it the last  one that was called in was short  so she is going to be a few days off  until she is able to pick up her meds

## 2023-02-18 ENCOUNTER — Other Ambulatory Visit (HOSPITAL_BASED_OUTPATIENT_CLINIC_OR_DEPARTMENT_OTHER): Payer: Self-pay

## 2023-02-18 ENCOUNTER — Other Ambulatory Visit: Payer: Self-pay | Admitting: Student

## 2023-02-18 DIAGNOSIS — K860 Alcohol-induced chronic pancreatitis: Secondary | ICD-10-CM

## 2023-02-18 DIAGNOSIS — G894 Chronic pain syndrome: Secondary | ICD-10-CM

## 2023-02-18 MED ORDER — BUPRENORPHINE 20 MCG/HR TD PTWK
1.0000 | MEDICATED_PATCH | TRANSDERMAL | 0 refills | Status: DC
  Filled 2023-02-18: qty 4, 28d supply, fill #0

## 2023-02-18 NOTE — Progress Notes (Signed)
Patient is requesting medication to be filled. PDMP shows appropriate refill history. Will be due for refill on 12/5 since it is 28 days. Ordered for it to be filled today.

## 2023-02-23 ENCOUNTER — Other Ambulatory Visit: Payer: Self-pay | Admitting: Internal Medicine

## 2023-02-23 ENCOUNTER — Other Ambulatory Visit (HOSPITAL_COMMUNITY): Payer: Self-pay

## 2023-02-23 DIAGNOSIS — K860 Alcohol-induced chronic pancreatitis: Secondary | ICD-10-CM

## 2023-02-23 DIAGNOSIS — G894 Chronic pain syndrome: Secondary | ICD-10-CM

## 2023-02-23 MED ORDER — BELBUCA 75 MCG BU FILM
75.0000 ug | ORAL_FILM | Freq: Every day | BUCCAL | 0 refills | Status: DC
  Filled 2023-02-28: qty 30, 30d supply, fill #0

## 2023-02-28 ENCOUNTER — Other Ambulatory Visit (HOSPITAL_COMMUNITY): Payer: Self-pay

## 2023-02-28 ENCOUNTER — Other Ambulatory Visit: Payer: Self-pay

## 2023-03-01 ENCOUNTER — Other Ambulatory Visit (HOSPITAL_COMMUNITY): Payer: Self-pay

## 2023-03-01 ENCOUNTER — Other Ambulatory Visit: Payer: Self-pay

## 2023-03-11 ENCOUNTER — Other Ambulatory Visit (HOSPITAL_BASED_OUTPATIENT_CLINIC_OR_DEPARTMENT_OTHER): Payer: Self-pay

## 2023-03-12 ENCOUNTER — Other Ambulatory Visit: Payer: Self-pay | Admitting: Student

## 2023-03-12 ENCOUNTER — Other Ambulatory Visit (HOSPITAL_BASED_OUTPATIENT_CLINIC_OR_DEPARTMENT_OTHER): Payer: Self-pay

## 2023-03-12 DIAGNOSIS — K860 Alcohol-induced chronic pancreatitis: Secondary | ICD-10-CM

## 2023-03-12 DIAGNOSIS — G894 Chronic pain syndrome: Secondary | ICD-10-CM

## 2023-03-14 ENCOUNTER — Other Ambulatory Visit (HOSPITAL_BASED_OUTPATIENT_CLINIC_OR_DEPARTMENT_OTHER): Payer: Self-pay

## 2023-03-14 ENCOUNTER — Other Ambulatory Visit: Payer: Self-pay | Admitting: Student

## 2023-03-14 DIAGNOSIS — K860 Alcohol-induced chronic pancreatitis: Secondary | ICD-10-CM

## 2023-03-14 DIAGNOSIS — G894 Chronic pain syndrome: Secondary | ICD-10-CM

## 2023-03-14 MED ORDER — BUPRENORPHINE 20 MCG/HR TD PTWK
1.0000 | MEDICATED_PATCH | TRANSDERMAL | 0 refills | Status: DC
  Filled ????-??-?? (×2): fill #0

## 2023-03-15 ENCOUNTER — Other Ambulatory Visit (HOSPITAL_COMMUNITY): Payer: Self-pay

## 2023-03-15 ENCOUNTER — Other Ambulatory Visit (HOSPITAL_BASED_OUTPATIENT_CLINIC_OR_DEPARTMENT_OTHER): Payer: Self-pay

## 2023-03-17 ENCOUNTER — Telehealth: Payer: Self-pay | Admitting: Internal Medicine

## 2023-03-17 ENCOUNTER — Other Ambulatory Visit (HOSPITAL_BASED_OUTPATIENT_CLINIC_OR_DEPARTMENT_OTHER): Payer: Self-pay

## 2023-03-17 DIAGNOSIS — G894 Chronic pain syndrome: Secondary | ICD-10-CM

## 2023-03-17 DIAGNOSIS — K860 Alcohol-induced chronic pancreatitis: Secondary | ICD-10-CM

## 2023-03-17 MED ORDER — BUPRENORPHINE 20 MCG/HR TD PTWK
1.0000 | MEDICATED_PATCH | TRANSDERMAL | 0 refills | Status: DC
  Filled 2023-03-17: qty 4, 28d supply, fill #0

## 2023-03-17 MED ORDER — BUPRENORPHINE 20 MCG/HR TD PTWK
1.0000 | MEDICATED_PATCH | TRANSDERMAL | 0 refills | Status: DC
  Filled ????-??-??: fill #0

## 2023-03-17 NOTE — Telephone Encounter (Signed)
 Patient called after-hours number needing refill of her BUTRANS patch. PDMP reviewed and sent in refill.

## 2023-03-17 NOTE — Addendum Note (Signed)
 Addended by: Elza Rafter on: 03/17/2023 03:19 PM   Modules accepted: Orders

## 2023-03-29 ENCOUNTER — Encounter: Payer: Medicare PPO | Admitting: Student

## 2023-04-01 ENCOUNTER — Other Ambulatory Visit (HOSPITAL_COMMUNITY): Payer: Self-pay

## 2023-04-01 ENCOUNTER — Other Ambulatory Visit (HOSPITAL_BASED_OUTPATIENT_CLINIC_OR_DEPARTMENT_OTHER): Payer: Self-pay

## 2023-04-12 ENCOUNTER — Encounter: Payer: Self-pay | Admitting: Student

## 2023-04-12 ENCOUNTER — Ambulatory Visit: Payer: Medicare PPO | Admitting: Student

## 2023-04-12 ENCOUNTER — Other Ambulatory Visit: Payer: Self-pay | Admitting: Internal Medicine

## 2023-04-12 ENCOUNTER — Other Ambulatory Visit: Payer: Self-pay | Admitting: Student

## 2023-04-12 VITALS — BP 121/70 | HR 79 | Temp 98.5°F | Ht 68.0 in | Wt 101.0 lb

## 2023-04-12 DIAGNOSIS — M810 Age-related osteoporosis without current pathological fracture: Secondary | ICD-10-CM | POA: Diagnosis not present

## 2023-04-12 DIAGNOSIS — K7030 Alcoholic cirrhosis of liver without ascites: Secondary | ICD-10-CM | POA: Diagnosis not present

## 2023-04-12 DIAGNOSIS — G894 Chronic pain syndrome: Secondary | ICD-10-CM

## 2023-04-12 DIAGNOSIS — K860 Alcohol-induced chronic pancreatitis: Secondary | ICD-10-CM

## 2023-04-12 DIAGNOSIS — Z1211 Encounter for screening for malignant neoplasm of colon: Secondary | ICD-10-CM

## 2023-04-12 DIAGNOSIS — I5032 Chronic diastolic (congestive) heart failure: Secondary | ICD-10-CM | POA: Diagnosis not present

## 2023-04-12 DIAGNOSIS — E039 Hypothyroidism, unspecified: Secondary | ICD-10-CM

## 2023-04-12 DIAGNOSIS — Z0000 Encounter for general adult medical examination without abnormal findings: Secondary | ICD-10-CM

## 2023-04-12 LAB — COMPREHENSIVE METABOLIC PANEL
ALT: 43 U/L (ref 0–44)
AST: 123 U/L — ABNORMAL HIGH (ref 15–41)
Albumin: 3.2 g/dL — ABNORMAL LOW (ref 3.5–5.0)
Alkaline Phosphatase: 145 U/L — ABNORMAL HIGH (ref 38–126)
Anion gap: 15 (ref 5–15)
BUN: 8 mg/dL (ref 8–23)
CO2: 19 mmol/L — ABNORMAL LOW (ref 22–32)
Calcium: 8.5 mg/dL — ABNORMAL LOW (ref 8.9–10.3)
Chloride: 102 mmol/L (ref 98–111)
Creatinine, Ser: 0.92 mg/dL (ref 0.44–1.00)
GFR, Estimated: >60 mL/min (ref >60)
Glucose, Bld: 150 mg/dL — ABNORMAL HIGH (ref 70–99)
Potassium: 4 mmol/L (ref 3.5–5.1)
Sodium: 136 mmol/L (ref 135–145)
Total Bilirubin: 0.9 mg/dL (ref 0.0–1.2)
Total Protein: 5.7 g/dL — ABNORMAL LOW (ref 6.5–8.1)

## 2023-04-12 MED ORDER — PROCHLORPERAZINE MALEATE 5 MG PO TABS
5.0000 mg | ORAL_TABLET | Freq: Four times a day (QID) | ORAL | 0 refills | Status: DC | PRN
  Filled 2023-04-12: qty 30, 8d supply, fill #0

## 2023-04-12 MED ORDER — BUPRENORPHINE 20 MCG/HR TD PTWK
1.0000 | MEDICATED_PATCH | TRANSDERMAL | 0 refills | Status: DC
  Filled 2023-04-14: qty 4, 28d supply, fill #0
  Filled ????-??-??: fill #0

## 2023-04-12 NOTE — Progress Notes (Unsigned)
Established Patient Office Visit  Subjective   Patient ID: Carolyn [name], female    DOB: [birth year]  Age: 68 y.o. MRN: [id number]  Chief Complaint  Patient presents with   Follow-up   Medication Refill    Carolyn [name] is a 68 y.o. who presents to the clinic for a follow up of chronic conditions. Please see problem based assessment and plan for additional details.  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   Abnormal loss of weight 03/23/2022   Chronic diastolic heart failure (HCC) 03/23/2022   Anemia of chronic disease 03/17/2022   Alcoholic cirrhosis of liver without ascites (HCC) 01/18/2022   Portal hypertensive gastropathy (HCC) 01/18/2022   Physical deconditioning 01/09/2022   Severe protein-calorie malnutrition (HCC) 01/07/2022   Normocytic anemia 01/07/2022   Osteomyelitis (HCC) 01/06/2022   Seizure (HCC) 05/08/2017   Chronic pain syndrome 05/08/2017   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HCC) 05/08/2017   Osteoporosis 03/16/2011   Malabsorption        Objective:     BP 121/70 (BP Location: Right Arm, Patient Position: Sitting, Cuff Size: Small)   Pulse 79   Temp 98.5 F (36.9 C) (Oral)   Ht 5\' 8"  (1.727 m)   Wt 101 lb (45.8 kg)   LMP 03/10/2009   SpO2 97%   BMI 15.36 kg/m  BP Readings from Last 3 Encounters:  04/12/23 121/70  01/27/23 138/86  12/27/22 129/82   Wt Readings from Last 3 Encounters:  04/12/23 101 lb (45.8 kg)  01/27/23 98 lb 12.8 oz (44.8 kg)  12/27/22 100 lb 11.2 oz (45.7 kg)      Physical Exam Vitals reviewed.  Constitutional:      Appearance: She is normal weight.  Cardiovascular:     Rate and Rhythm: Normal rate and regular rhythm.     Heart sounds: No murmur heard. Pulmonary:     Effort: Pulmonary effort is normal. No respiratory distress.     Breath sounds:  Normal breath sounds. No stridor. No wheezing or rhonchi.  Abdominal:     General: There is no distension.     Palpations: Abdomen is soft.     Tenderness: There is no abdominal tenderness. There is no guarding.  Musculoskeletal:     Right lower leg: No edema.     Left lower leg: No edema.  Skin:    General: Skin is warm and dry.     Coloration: Skin is not jaundiced.  Neurological:     Mental Status: She is alert.  Psychiatric:        Mood and Affect: Mood and affect normal.        Behavior: Behavior normal.     Comments: Very pleasant      Results for orders placed or performed in visit on 04/12/23  CMP w Anion Gap (STAT/Sunquest-performed on-site)  Result Value Ref Range   Sodium 136 135 - 145 mmol/L   Potassium 4.0 3.5 - 5.1 mmol/L   Chloride 102 98 - 111 mmol/L   CO2 19 (L) 22 - 32 mmol/L   Glucose, Bld 150 (H) 70 - 99 mg/dL   BUN 8 8 - 23 mg/dL   Creatinine, Ser 4.09 0.44 - 1.00 mg/dL   Calcium 8.5 (L) 8.9 - 10.3 mg/dL   Total Protein 5.7 (L) 6.5 - 8.1 g/dL   Albumin 3.2 (L)  3.5 - 5.0 g/dL   AST 161 (H) 15 - 41 U/L   ALT 43 0 - 44 U/L   Alkaline Phosphatase 145 (H) 38 - 126 U/L   Total Bilirubin 0.9 0.0 - 1.2 mg/dL   GFR, Estimated >09 >60 mL/min   Anion gap 15 5 - 15    Last metabolic panel Lab Results  Component Value Date   GLUCOSE 150 (H) 04/12/2023   NA 136 04/12/2023   K 4.0 04/12/2023   CL 102 04/12/2023   CO2 19 (L) 04/12/2023   BUN 8 04/12/2023   CREATININE 0.92 04/12/2023   EGFR 86 09/06/2022   CALCIUM 8.5 (L) 04/12/2023   PHOS 3.6 04/10/2022   PROT 5.7 (L) 04/12/2023   ALBUMIN 3.2 (L) 04/12/2023   LABGLOB 2.4 09/06/2022   AGRATIO 1.4 08/06/2022   BILITOT 0.9 04/12/2023   ALKPHOS 145 (H) 04/12/2023   AST 123 (H) 04/12/2023   ALT 43 04/12/2023   ANIONGAP 15 04/12/2023   Last thyroid functions Lab Results  Component Value Date   TSH 0.777 01/27/2023      The ASCVD Risk score (Arnett DK, et al., 2019) failed to calculate for the  following reasons:   Cannot find a previous HDL lab   Cannot find a previous total cholesterol lab    Assessment & Plan:   Problem List Items Addressed This Visit       Cardiovascular and Mediastinum   Chronic diastolic heart failure (HCC) (Chronic)   Patient has a history of HFpEF.  She is currently euvolemic on exam and asymptomatic. Plan: -Continue Lopressor 12.5 mg twice daily        Digestive   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HCC) (Chronic)   Patient has a history of chronic pancreatitis due to alcohol use disorder.  Her pain is currently controlled on a regimen of buprenorphine patches 20 mcg weekly and Belbuca 75 mcg daily.  Patient denies nausea and vomiting today.  She reported that she become nauseated and will have vomiting if she eats too much.  She was on Phenergan as needed since July.  She has tried Zofran in the past and this has not helped her.  Patient was taken off Phenergan and I prescribed her 30 tablets of Compazine 2 months ago and this did seem to help her. She said that she will take it maybe 2 times per week.  She denies any tardive dyskinesia symptoms.  Abdominal exam was unremarkable, there is no tenderness or guarding present.  Patient's weight has remained stable, she remains underweight with a BMI of 15. Plan: -Will refill Compazine as a as needed for her nausea and vomiting of the chronic alcoholic pancreatitis. -Patient has an appointment with GI in March, I am hopeful that the Compazine will help until then      Alcoholic cirrhosis of liver without ascites (HCC) (Chronic)   Patient has a history of alcoholic cirrhosis and is not followed with GI within the past year.  She denies alcohol use. Plan: -49-month liver ultrasound for [facility] surveillance ordered -Patient will need an AFP ordered at follow-up, we were unable to draw enough blood for an AFP today -Patient encouraged to follow-up with GI, she has an appointment in March.      Relevant Orders    US Abdomen Limited RUQ (LIVER/GB)   AFP Tumor Marker   CMP w Anion Gap (STAT/Sunquest-performed on-site) (Completed)     Endocrine   Hypothyroidism   Patient is currently well-controlled on Synthroid  50 mcg, her TSH in November was 0.77.  She is currently asymptomatic.  Will continue current regimen of Synthroid 50 mcg and recommend repeat TSH in May.        Musculoskeletal and Integument   Osteoporosis   Relevant Orders   DG Bone Density     Other   Chronic pain syndrome - Primary (Chronic)   Patient is currently treated for chronic pain syndrome due to her chronic pancreatitis.  She is on a regimen of buprenorphine 20 mcg patches weekly and Belbuca 75 mcg daily.  Her pain is well-controlled on her current regimen.  Patient reported that her insurance is no longer covering the Belbuca 75 mcg patches.  I spoke with the patient regarding her Belbuca patches on the phone that evening, patient was informed that we reached out to [employer] for assistance with prescription assistance.  Patient would like me to refill her Belbuca patches when appropriate, she reported that she be willing to pay because it is controlling her pain. Plan: -Tox assure ordered -Buprenorphine 20 mcg weekly patches ordered to be filled on 1/30 after reviewing PDMP.      Relevant Orders   Psychologist, educational   Cologuard and DEXA scan ordered.      Other Visit Diagnoses       Colon cancer screening       Relevant Orders   Cologuard        Return in about 3 months (around 07/11/2023) for Chronic pain syndrome, patient will need AFP.    Faith Rogue, DO

## 2023-04-12 NOTE — Patient Instructions (Signed)
Thank you, Ms.Reise C Takayama for allowing [place] to provide your care today. Today we discussed chronic pain disorder and pancreatitis.    I have ordered the following labs for you:   Lab Orders         ToxAssure Select,+Antidepr,UR         AFP Tumor Marker         CMP14 + Anion Gap         Cologuard       I have ordered the following medication/changed the following medications:   Stop the following medications: Medications Discontinued During This Encounter  Medication Reason   gabapentin (NEURONTIN) 300 MG capsule Discontinued by provider   ondansetron (ZOFRAN) 4 MG tablet Change in therapy   pantoprazole (PROTONIX) 40 MG tablet Completed Course   saccharomyces boulardii (FLORASTOR) 250 MG capsule Patient Preference   saccharomyces boulardii (FLORASTOR) 250 MG capsule Patient Preference   silver sulfADIAZINE (SILVADENE) 1 % cream Completed Course   zinc sulfate 220 (50 Zn) MG capsule Patient has not taken in last 30 days     Start the following medications: No orders of the defined types were placed in this encounter.    Follow up: 3 months    Remember: To follow up with your GI doctor (stomach doctor) in March. Please let us know if you have any SOB or lower leg edema.   Should you have any questions or concerns please call the internal medicine clinic at [phone].     Please note that our late policy has changed.  If you are more than 15 minutes late to your appointment, you may be asked to reschedule your appointment.  Dr. Hessie Diener, D.O. [facility]

## 2023-04-13 ENCOUNTER — Other Ambulatory Visit: Payer: Self-pay | Admitting: Student

## 2023-04-13 ENCOUNTER — Other Ambulatory Visit: Payer: Self-pay

## 2023-04-13 ENCOUNTER — Other Ambulatory Visit (HOSPITAL_COMMUNITY): Payer: Self-pay

## 2023-04-13 ENCOUNTER — Other Ambulatory Visit (HOSPITAL_BASED_OUTPATIENT_CLINIC_OR_DEPARTMENT_OTHER): Payer: Self-pay

## 2023-04-13 DIAGNOSIS — K860 Alcohol-induced chronic pancreatitis: Secondary | ICD-10-CM

## 2023-04-13 DIAGNOSIS — G894 Chronic pain syndrome: Secondary | ICD-10-CM

## 2023-04-13 NOTE — Assessment & Plan Note (Addendum)
Patient has a history of alcoholic cirrhosis and is not followed with GI within the past year.  She denies alcohol use. Plan: -86-month liver ultrasound for [facility] surveillance ordered -Patient will need an AFP ordered at follow-up, we were unable to draw enough blood for an AFP today -Patient encouraged to follow-up with GI, she has an appointment in March.

## 2023-04-13 NOTE — Assessment & Plan Note (Signed)
Patient has a history of HFpEF.  She is currently euvolemic on exam and asymptomatic. Plan: -Continue Lopressor 12.5 mg twice daily

## 2023-04-13 NOTE — Assessment & Plan Note (Addendum)
Patient is currently treated for chronic pain syndrome due to her chronic pancreatitis.  She is on a regimen of buprenorphine 20 mcg patches weekly and Belbuca 75 mcg daily.  Her pain is well-controlled on her current regimen.  Patient reported that her insurance is no longer covering the Belbuca 75 mcg patches.  I spoke with the patient regarding her Belbuca patches on the phone that evening, patient was informed that we reached out to [employer] for assistance with prescription assistance.  Patient would like me to refill her Belbuca patches when appropriate, she reported that she be willing to pay because it is controlling her pain. Plan: -Tox assure ordered -Buprenorphine 20 mcg weekly patches ordered to be filled on 1/30 after reviewing PDMP.

## 2023-04-13 NOTE — Assessment & Plan Note (Signed)
Cologuard and DEXA scan ordered.

## 2023-04-13 NOTE — Assessment & Plan Note (Signed)
Patient has a history of chronic pancreatitis due to alcohol use disorder.  Her pain is currently controlled on a regimen of buprenorphine patches 20 mcg weekly and Belbuca 75 mcg daily.  Patient denies nausea and vomiting today.  She reported that she become nauseated and will have vomiting if she eats too much.  She was on Phenergan as needed since July.  She has tried Zofran in the past and this has not helped her.  Patient was taken off Phenergan and I prescribed her 30 tablets of Compazine 2 months ago and this did seem to help her. She said that she will take it maybe 2 times per week.  She denies any tardive dyskinesia symptoms.  Abdominal exam was unremarkable, there is no tenderness or guarding present.  Patient's weight has remained stable, she remains underweight with a BMI of 15. Plan: -Will refill Compazine as a as needed for her nausea and vomiting of the chronic alcoholic pancreatitis. -Patient has an appointment with GI in March, I am hopeful that the Compazine will help until then

## 2023-04-13 NOTE — Assessment & Plan Note (Signed)
Patient is currently well-controlled on Synthroid 50 mcg, her TSH in November was 0.77.  She is currently asymptomatic.  Will continue current regimen of Synthroid 50 mcg and recommend repeat TSH in May.

## 2023-04-14 ENCOUNTER — Other Ambulatory Visit: Payer: Self-pay | Admitting: Student

## 2023-04-14 ENCOUNTER — Other Ambulatory Visit (HOSPITAL_BASED_OUTPATIENT_CLINIC_OR_DEPARTMENT_OTHER): Payer: Self-pay

## 2023-04-14 ENCOUNTER — Other Ambulatory Visit (HOSPITAL_COMMUNITY): Payer: Self-pay

## 2023-04-14 MED ORDER — BELBUCA 75 MCG BU FILM
75.0000 ug | ORAL_FILM | Freq: Every day | BUCCAL | 0 refills | Status: DC
  Filled 2023-04-14: qty 30, 30d supply, fill #0

## 2023-04-14 NOTE — Progress Notes (Signed)
Internal Medicine Clinic Attending  Case discussed with the resident at the time of the visit.  We reviewed the resident's history and exam and pertinent patient test results.  I agree with the assessment, diagnosis, and plan of care documented in the resident's note.   She is taking bup patches weekly + belbuca daily for chronic pain.  Her insurance stopped covering the belbuca. I agree with discussing options with our pharmacy team. Would not order oral suboxone in combination with the patches.

## 2023-04-15 LAB — TOXASSURE SELECT,+ANTIDEPR,UR

## 2023-04-19 ENCOUNTER — Other Ambulatory Visit (HOSPITAL_BASED_OUTPATIENT_CLINIC_OR_DEPARTMENT_OTHER): Payer: Self-pay

## 2023-04-19 ENCOUNTER — Telehealth: Payer: Self-pay

## 2023-04-19 NOTE — Telephone Encounter (Signed)
 Prior Authorization for patient ( came through via fax from patient insurance..   Per [employer] we're writing in response to your recent prior authorization request for Belbuca  75 mcg film. Belbuca  75 mcg film is available to you at the amount listed in your plan documents you don't need an authorization.

## 2023-04-21 ENCOUNTER — Other Ambulatory Visit (HOSPITAL_BASED_OUTPATIENT_CLINIC_OR_DEPARTMENT_OTHER): Payer: Self-pay

## 2023-04-25 ENCOUNTER — Telehealth: Payer: Self-pay | Admitting: *Deleted

## 2023-04-25 NOTE — Telephone Encounter (Signed)
 Called patient left voice message for patient to callt the breast  center -[phone] schedule her appointment.

## 2023-05-09 ENCOUNTER — Other Ambulatory Visit (HOSPITAL_BASED_OUTPATIENT_CLINIC_OR_DEPARTMENT_OTHER): Payer: Self-pay

## 2023-05-09 ENCOUNTER — Other Ambulatory Visit: Payer: Self-pay | Admitting: Student

## 2023-05-09 DIAGNOSIS — G894 Chronic pain syndrome: Secondary | ICD-10-CM

## 2023-05-09 DIAGNOSIS — K860 Alcohol-induced chronic pancreatitis: Secondary | ICD-10-CM

## 2023-05-09 LAB — COLOGUARD: COLOGUARD: NEGATIVE

## 2023-05-09 MED ORDER — BUPRENORPHINE 20 MCG/HR TD PTWK
1.0000 | MEDICATED_PATCH | TRANSDERMAL | 0 refills | Status: DC
  Filled 2023-05-12: qty 4, 28d supply, fill #0

## 2023-05-10 ENCOUNTER — Other Ambulatory Visit: Payer: Self-pay | Admitting: Internal Medicine

## 2023-05-10 ENCOUNTER — Other Ambulatory Visit: Payer: Self-pay | Admitting: Student

## 2023-05-10 ENCOUNTER — Other Ambulatory Visit (HOSPITAL_BASED_OUTPATIENT_CLINIC_OR_DEPARTMENT_OTHER): Payer: Self-pay

## 2023-05-10 MED ORDER — ALENDRONATE SODIUM 70 MG/75ML PO SOLN
70.0000 mg | ORAL | 2 refills | Status: DC
  Filled 2023-05-10: qty 300, 28d supply, fill #0

## 2023-05-11 ENCOUNTER — Other Ambulatory Visit (HOSPITAL_BASED_OUTPATIENT_CLINIC_OR_DEPARTMENT_OTHER): Payer: Self-pay

## 2023-05-12 ENCOUNTER — Other Ambulatory Visit (HOSPITAL_BASED_OUTPATIENT_CLINIC_OR_DEPARTMENT_OTHER): Payer: Self-pay

## 2023-05-13 ENCOUNTER — Emergency Department (HOSPITAL_COMMUNITY): Payer: Medicare PPO

## 2023-05-13 ENCOUNTER — Encounter (HOSPITAL_COMMUNITY): Payer: Self-pay

## 2023-05-13 ENCOUNTER — Other Ambulatory Visit (HOSPITAL_COMMUNITY): Payer: Medicare PPO

## 2023-05-13 ENCOUNTER — Ambulatory Visit (HOSPITAL_COMMUNITY): Payer: Medicare PPO

## 2023-05-13 ENCOUNTER — Inpatient Hospital Stay (HOSPITAL_COMMUNITY)
Admission: EM | Admit: 2023-05-13 | Discharge: 2023-05-17 | DRG: 480 | Disposition: A | Discharge status: Home / Self Care | Payer: Medicare PPO | Attending: Internal Medicine | Admitting: Internal Medicine

## 2023-05-13 ENCOUNTER — Other Ambulatory Visit: Payer: Self-pay

## 2023-05-13 ENCOUNTER — Other Ambulatory Visit: Payer: Self-pay | Admitting: Student

## 2023-05-13 ENCOUNTER — Other Ambulatory Visit (HOSPITAL_BASED_OUTPATIENT_CLINIC_OR_DEPARTMENT_OTHER): Payer: Self-pay

## 2023-05-13 DIAGNOSIS — W010XXA Fall on same level from slipping, tripping and stumbling without subsequent striking against object, initial encounter: Secondary | ICD-10-CM | POA: Diagnosis present

## 2023-05-13 DIAGNOSIS — R011 Cardiac murmur, unspecified: Secondary | ICD-10-CM | POA: Diagnosis present

## 2023-05-13 DIAGNOSIS — K7030 Alcoholic cirrhosis of liver without ascites: Secondary | ICD-10-CM | POA: Diagnosis present

## 2023-05-13 DIAGNOSIS — S72001A Fracture of unspecified part of neck of right femur, initial encounter for closed fracture: Principal | ICD-10-CM

## 2023-05-13 DIAGNOSIS — E43 Unspecified severe protein-calorie malnutrition: Secondary | ICD-10-CM | POA: Diagnosis present

## 2023-05-13 DIAGNOSIS — K8689 Other specified diseases of pancreas: Secondary | ICD-10-CM | POA: Diagnosis not present

## 2023-05-13 DIAGNOSIS — G894 Chronic pain syndrome: Secondary | ICD-10-CM | POA: Diagnosis present

## 2023-05-13 DIAGNOSIS — L89151 Pressure ulcer of sacral region, stage 1: Secondary | ICD-10-CM | POA: Diagnosis present

## 2023-05-13 DIAGNOSIS — R000 Tachycardia, unspecified: Secondary | ICD-10-CM | POA: Diagnosis present

## 2023-05-13 DIAGNOSIS — S7291XA Unspecified fracture of right femur, initial encounter for closed fracture: Secondary | ICD-10-CM | POA: Diagnosis present

## 2023-05-13 DIAGNOSIS — Y92009 Unspecified place in unspecified non-institutional (private) residence as the place of occurrence of the external cause: Secondary | ICD-10-CM | POA: Diagnosis not present

## 2023-05-13 DIAGNOSIS — S72031D Displaced midcervical fracture of right femur, subsequent encounter for closed fracture with routine healing: Secondary | ICD-10-CM | POA: Diagnosis not present

## 2023-05-13 DIAGNOSIS — M80051A Age-related osteoporosis with current pathological fracture, right femur, initial encounter for fracture: Secondary | ICD-10-CM | POA: Diagnosis present

## 2023-05-13 DIAGNOSIS — S72002A Fracture of unspecified part of neck of left femur, initial encounter for closed fracture: Secondary | ICD-10-CM | POA: Diagnosis not present

## 2023-05-13 DIAGNOSIS — D62 Acute posthemorrhagic anemia: Secondary | ICD-10-CM | POA: Diagnosis not present

## 2023-05-13 DIAGNOSIS — D6959 Other secondary thrombocytopenia: Secondary | ICD-10-CM | POA: Diagnosis present

## 2023-05-13 DIAGNOSIS — E039 Hypothyroidism, unspecified: Secondary | ICD-10-CM | POA: Diagnosis present

## 2023-05-13 DIAGNOSIS — I10 Essential (primary) hypertension: Secondary | ICD-10-CM | POA: Diagnosis not present

## 2023-05-13 DIAGNOSIS — Z79899 Other long term (current) drug therapy: Secondary | ICD-10-CM

## 2023-05-13 DIAGNOSIS — Z803 Family history of malignant neoplasm of breast: Secondary | ICD-10-CM

## 2023-05-13 DIAGNOSIS — S8291XD Unspecified fracture of right lower leg, subsequent encounter for closed fracture with routine healing: Secondary | ICD-10-CM

## 2023-05-13 DIAGNOSIS — I5032 Chronic diastolic (congestive) heart failure: Secondary | ICD-10-CM | POA: Diagnosis present

## 2023-05-13 DIAGNOSIS — Z681 Body mass index (BMI) 19 or less, adult: Secondary | ICD-10-CM | POA: Diagnosis not present

## 2023-05-13 DIAGNOSIS — R5381 Other malaise: Secondary | ICD-10-CM | POA: Diagnosis not present

## 2023-05-13 DIAGNOSIS — G40909 Epilepsy, unspecified, not intractable, without status epilepticus: Secondary | ICD-10-CM | POA: Diagnosis present

## 2023-05-13 DIAGNOSIS — K861 Other chronic pancreatitis: Secondary | ICD-10-CM | POA: Diagnosis present

## 2023-05-13 DIAGNOSIS — S72031A Displaced midcervical fracture of right femur, initial encounter for closed fracture: Secondary | ICD-10-CM

## 2023-05-13 DIAGNOSIS — M810 Age-related osteoporosis without current pathological fracture: Secondary | ICD-10-CM | POA: Diagnosis present

## 2023-05-13 DIAGNOSIS — R569 Unspecified convulsions: Secondary | ICD-10-CM

## 2023-05-13 DIAGNOSIS — E119 Type 2 diabetes mellitus without complications: Secondary | ICD-10-CM | POA: Diagnosis present

## 2023-05-13 DIAGNOSIS — D638 Anemia in other chronic diseases classified elsewhere: Secondary | ICD-10-CM | POA: Diagnosis present

## 2023-05-13 DIAGNOSIS — Z9884 Bariatric surgery status: Secondary | ICD-10-CM

## 2023-05-13 DIAGNOSIS — K860 Alcohol-induced chronic pancreatitis: Secondary | ICD-10-CM

## 2023-05-13 DIAGNOSIS — Z79890 Hormone replacement therapy (postmenopausal): Secondary | ICD-10-CM

## 2023-05-13 DIAGNOSIS — Z7983 Long term (current) use of bisphosphonates: Secondary | ICD-10-CM

## 2023-05-13 DIAGNOSIS — K257 Chronic gastric ulcer without hemorrhage or perforation: Secondary | ICD-10-CM | POA: Diagnosis present

## 2023-05-13 DIAGNOSIS — M8080XP Other osteoporosis with current pathological fracture, unspecified site, subsequent encounter for fracture with malunion: Secondary | ICD-10-CM | POA: Diagnosis not present

## 2023-05-13 LAB — CBC WITH DIFFERENTIAL/PLATELET
Abs Immature Granulocytes: 0.03 10*3/uL (ref 0.00–0.07)
Basophils Absolute: 0 10*3/uL (ref 0.0–0.1)
Basophils Relative: 1 %
Eosinophils Absolute: 0 10*3/uL (ref 0.0–0.5)
Eosinophils Relative: 0 %
HCT: 27.4 % — ABNORMAL LOW (ref 36.0–46.0)
Hemoglobin: 9.2 g/dL — ABNORMAL LOW (ref 12.0–15.0)
Immature Granulocytes: 1 %
Lymphocytes Relative: 21 %
Lymphs Abs: 1.1 10*3/uL (ref 0.7–4.0)
MCH: 33.2 pg (ref 26.0–34.0)
MCHC: 33.6 g/dL (ref 30.0–36.0)
MCV: 98.9 fL (ref 80.0–100.0)
Monocytes Absolute: 0.4 10*3/uL (ref 0.1–1.0)
Monocytes Relative: 7 %
Neutro Abs: 3.9 10*3/uL (ref 1.7–7.7)
Neutrophils Relative %: 70 %
Platelets: 66 10*3/uL — ABNORMAL LOW (ref 150–400)
RBC: 2.77 MIL/uL — ABNORMAL LOW (ref 3.87–5.11)
RDW: 12.8 % (ref 11.5–15.5)
WBC: 5.5 10*3/uL (ref 4.0–10.5)
nRBC: 0 % (ref 0.0–0.2)

## 2023-05-13 LAB — BASIC METABOLIC PANEL
Anion gap: 10 (ref 5–15)
BUN: 15 mg/dL (ref 8–23)
CO2: 22 mmol/L (ref 22–32)
Calcium: 8.3 mg/dL — ABNORMAL LOW (ref 8.9–10.3)
Chloride: 106 mmol/L (ref 98–111)
Creatinine, Ser: 0.87 mg/dL (ref 0.44–1.00)
GFR, Estimated: >60 mL/min (ref >60)
Glucose, Bld: 142 mg/dL — ABNORMAL HIGH (ref 70–99)
Potassium: 4.2 mmol/L (ref 3.5–5.1)
Sodium: 138 mmol/L (ref 135–145)

## 2023-05-13 LAB — CBG MONITORING, ED: Glucose-Capillary: 248 mg/dL — ABNORMAL HIGH (ref 70–99)

## 2023-05-13 MED ORDER — INSULIN ASPART 100 UNIT/ML IJ SOLN
0.0000 [IU] | Freq: Three times a day (TID) | INTRAMUSCULAR | Status: DC
  Administered 2023-05-14 – 2023-05-15 (×2): 3 [IU] via SUBCUTANEOUS
  Administered 2023-05-15: 2 [IU] via SUBCUTANEOUS
  Administered 2023-05-16: 3 [IU] via SUBCUTANEOUS
  Administered 2023-05-16 – 2023-05-17 (×2): 5 [IU] via SUBCUTANEOUS

## 2023-05-13 MED ORDER — ONDANSETRON HCL 4 MG PO TABS
4.0000 mg | ORAL_TABLET | Freq: Four times a day (QID) | ORAL | Status: DC | PRN
  Administered 2023-05-17: 4 mg via ORAL
  Filled 2023-05-13: qty 1

## 2023-05-13 MED ORDER — ONDANSETRON HCL 4 MG/2ML IJ SOLN
4.0000 mg | Freq: Four times a day (QID) | INTRAMUSCULAR | Status: DC | PRN
  Administered 2023-05-14 – 2023-05-16 (×3): 4 mg via INTRAVENOUS
  Filled 2023-05-13: qty 2

## 2023-05-13 MED ORDER — FENTANYL CITRATE PF 50 MCG/ML IJ SOSY
25.0000 ug | PREFILLED_SYRINGE | Freq: Once | INTRAMUSCULAR | Status: AC
  Administered 2023-05-13: 25 ug via INTRAVENOUS
  Filled 2023-05-13: qty 1

## 2023-05-13 MED ORDER — HYDROMORPHONE HCL 1 MG/ML IJ SOLN
1.0000 mg | INTRAMUSCULAR | Status: DC | PRN
  Administered 2023-05-13: 1 mg via INTRAVENOUS
  Administered 2023-05-14 (×2): .4 mg via INTRAVENOUS
  Administered 2023-05-14: 1 mg via INTRAVENOUS
  Administered 2023-05-14 (×3): .4 mg via INTRAVENOUS
  Administered 2023-05-14 (×2): 1 mg via INTRAVENOUS
  Filled 2023-05-13 (×4): qty 1

## 2023-05-13 MED ORDER — INSULIN ASPART 100 UNIT/ML IJ SOLN
0.0000 [IU] | Freq: Every day | INTRAMUSCULAR | Status: DC
  Administered 2023-05-13: 2 [IU] via SUBCUTANEOUS

## 2023-05-13 MED ORDER — LACTATED RINGERS IV SOLN: INTRAVENOUS | Status: DC

## 2023-05-13 NOTE — Progress Notes (Signed)
 Case dw EDP.  Appreciate hospitalist for admission.  Will plan for IMN tomorrow in OR with Dr. Linna Caprice.  NPO tonight at [place].  Please admit to [facility]

## 2023-05-13 NOTE — ED Notes (Signed)
 Dr.Yates called re-paged for orthro surgery

## 2023-05-13 NOTE — ED Provider Notes (Signed)
 [place] EMERGENCY DEPARTMENT AT [facility] Provider Note   CSN: [id number] Arrival date & time: 05/13/23  1854     History  No chief complaint on file.   Carolyn [name] is a 68 y.o. female with PMHx GERD, osteoporosis, alcoholic cirrhosis, chronic pancreatitis, DM, seizures who presents to ED concern for right hip pain.  Patient stating that she had cataract surgery yesterday and she is having troubles with depth perception as her eye heals.  Patient stating that she mis-stepped and landed onto her right hip.  Patient stating that she is unable to put weight onto this leg.  Denies head trauma, LOC, blood thinners.  HPI     Home Medications Prior to Admission medications   Medication Sig Start Date End Date Taking? Authorizing Provider  alendronate (FOSAMAX) 70 MG/75ML solution Take 75 mLs (70 mg total) by mouth every 7 (seven) days. Take with a full glass of water on an empty stomach. 05/10/23   Faith Rogue, DO  buprenorphine (BUTRANS) 20 MCG/HR PTWK Place 1 patch onto the skin once a week. 05/12/23   Faith Rogue, DO  Buprenorphine HCl (BELBUCA) 75 MCG FILM Place 1 film (75 mcg) inside cheek daily at 6 (six) AM. 04/14/23 05/15/23  Faith Rogue, DO  cholecalciferol (CHOLECALCIFEROL) 25 MCG tablet Take 1 tablet (1,000 Units total) by mouth daily. 04/13/22   Alwyn Ren, MD  famotidine (PEPCID) 10 MG tablet Take 10 mg by mouth 2 (two) times daily.    [provider]  feeding supplement (ENSURE ENLIVE / ENSURE PLUS) LIQD Take 237 mLs by mouth 3 (three) times daily between meals. 04/12/22   Alwyn Ren, MD  levothyroxine (SYNTHROID) 50 MCG tablet Take 1 tablet (50 mcg total) by mouth daily at 6 (six) AM. 11/24/22   Nooruddin, Jason Fila, MD  lipase/protease/amylase (CREON) 12000-38000 units CPEP capsule Take 2 capsules (24,000 Units total) by mouth 3 (three) times daily with meals. 01/27/23   Monna Fam, MD  metoprolol tartrate (LOPRESSOR) 25 MG tablet Take  1/2 tablet (12.5 mg total) by mouth 2 (two) times daily. 11/24/22 07/07/23  Nooruddin, Jason Fila, MD  Multiple Vitamin (MULITIVITAMIN WITH MINERALS) TABS Take 1 tablet by mouth daily.    [provider]  Nutritional Supplements (,FEEDING SUPPLEMENT, PROSOURCE PLUS) liquid Take 30 mLs by mouth 2 (two) times daily between meals. 04/12/22   Alwyn Ren, MD  prochlorperazine (COMPAZINE) 5 MG tablet Take 1 tablet (5 mg total) by mouth every 6 (six) hours as needed for nausea or vomiting. 04/12/23   Faith Rogue, DO  pyridOXINE (B-6) 100 MG tablet Take 1 tablet (100 mg total) by mouth daily. 04/13/22   Alwyn Ren, MD  vitamin A 3 MG (10000 UNITS) capsule Take 1 capsule (10,000 Units total) by mouth every other day. 04/13/22   Alwyn Ren, MD  vitamin B-12 (CYANOCOBALAMIN) 100 MCG tablet Take 100 mcg by mouth daily.    [provider]  Ranitidine HCl (ZANTAC PO) Take by mouth.    06/06/11  [provider]      Allergies    Other, Peanut-containing drug products, and Pecan extract    Review of Systems   Review of Systems  Musculoskeletal:        Hip pain    Physical Exam Updated Vital Signs BP 113/65   Pulse (!) 102   Temp 98 F (36.7 C) (Oral)   Resp 18   Ht 5\' 8"  (1.727 m)   Wt 45.8  kg   LMP 03/10/2009   SpO2 100%   BMI 15.35 kg/m  Physical Exam Vitals and nursing note reviewed.  Constitutional:      General: She is not in acute distress.    Appearance: She is not ill-appearing or toxic-appearing.  HENT:     Head: Normocephalic and atraumatic.  Eyes:     General: No scleral icterus.       Right eye: No discharge.        Left eye: No discharge.     Conjunctiva/sclera: Conjunctivae normal.  Cardiovascular:     Rate and Rhythm: Normal rate.  Pulmonary:     Effort: Pulmonary effort is normal.  Abdominal:     General: Abdomen is flat.  Musculoskeletal:     Comments: Tenderness to palpation of right hip. +2 pedal pulses. Foot ROM  intact.  Skin:    General: Skin is warm and dry.  Neurological:     General: No focal deficit present.     Mental Status: She is alert. Mental status is at baseline.  Psychiatric:        Mood and Affect: Mood normal.        Behavior: Behavior normal.     ED Results / Procedures / Treatments   Labs (all labs ordered are listed, but only abnormal results are displayed) Labs Reviewed  CBC WITH DIFFERENTIAL/PLATELET - Abnormal; Notable for the following components:      Result Value   RBC 2.77 (*)    Hemoglobin 9.2 (*)    HCT 27.4 (*)    Platelets 66 (*)    All other components within normal limits  BASIC METABOLIC PANEL - Abnormal; Notable for the following components:   Glucose, Bld 142 (*)    Calcium 8.3 (*)    All other components within normal limits    EKG None  Radiology DG Hip Unilat W or Wo Pelvis 2-3 Views Right Result Date: 05/13/2023 CLINICAL DATA:  Status post fall. EXAM: DG HIP (WITH OR WITHOUT PELVIS) 2-3V RIGHT COMPARISON:  September 26, 2022 FINDINGS: There is an acute, comminuted fracture deformity extending through the inter trochanteric region of the proximal right femur. There is no evidence of dislocation. Mild degenerative changes are seen in the form of joint space narrowing and acetabular sclerosis. IMPRESSION: Acute, comminuted fracture of the proximal right femur. Electronically Signed   By: Aram Candela M.D.   On: 05/13/2023 20:26   DG Chest 1 View Result Date: 05/13/2023 CLINICAL DATA:  Status post fall. EXAM: CHEST  1 VIEW COMPARISON:  Jul 14, 2022 FINDINGS: The heart size and mediastinal contours are within normal limits. The lungs are hyperinflated. Mild, diffuse, chronic appearing increased interstitial lung markings are noted. There is no evidence of focal consolidation, pleural effusion or pneumothorax. Radiopaque surgical clips are seen within the upper abdomen. A chronic eighth right rib fracture is noted. Multilevel degenerative changes are noted  throughout the thoracic spine. IMPRESSION: Chronic appearing increased interstitial lung markings without evidence of acute or active cardiopulmonary disease. Electronically Signed   By: Aram Candela M.D.   On: 05/13/2023 20:23    Procedures Procedures    Medications Ordered in ED Medications  fentaNYL (SUBLIMAZE) injection 25 mcg (25 mcg Intravenous Given 05/13/23 2122)  fentaNYL (SUBLIMAZE) injection 25 mcg (25 mcg Intravenous Given 05/13/23 2220)    ED Course/ Medical Decision Making/ A&P  Medical Decision Making Amount and/or Complexity of Data Reviewed Labs: ordered. Radiology: ordered.  Risk Prescription drug management.   This patient presents to the ED for concern of hip pain, this involves an extensive number of treatment options, and is a complaint that carries with it a high risk of complications and morbidity.  The differential diagnosis includes hemarthrosis, gout, septic joint, fracture, tendonitis, muscle strain, bursitis, compartment syndrome   Co morbidities that complicate the patient evaluation  GERD, osteoporosis, alcoholic cirrhosis, chronic pancreatitis, DM, seizures   Additional history obtained:  Dr. Hessie Diener PCP    Problem List / ED Course / Critical interventions / Medication management  Patient presents to ED concern for hip pain after mechanical fall.  Physical exam with tenderness palpation of the right hip.  +2 pedal pulses.  Rest of physical exam reassuring.  Patient afebrile with stable vitals. BMP reassuring.  CBC without leukocytosis or anemia. Triage provider ordered imaging studies including chest/hip x-ray. I independently visualized and interpreted imaging. I agree with the radiologist interpretation of acute, comminuted fracture of the proximal right femur. . Shared results with patient. I requested consultation with the orthopedic on-call Dr. Aundria Rud,  and discussed lab and imaging findings as well as  pertinent plan - they recommend: admission to [facility]. Dr. Leroy Libman will be surgically correcting hip tomorrow morning. NPO at midnight.  Dr. Mikeal Hawthorne Admitting provider. Agrees to Admit to [employer]. I appreciate his help. I have reviewed the patients home medicines and have made adjustments as needed   Social Determinants of Health:  none         Final Clinical Impression(s) / ED Diagnoses Final diagnoses:  Closed fracture of right hip, initial encounter [facility])    Rx / DC Orders ED Discharge Orders     None         Margarita Rana 05/13/23 2332    Glyn Ade, MD 05/14/23 [phone]

## 2023-05-13 NOTE — ED Triage Notes (Signed)
 Pt states she just had cataract surgery and tripped and fell onto right hip. Pt denies hitting head. Pt c/o right hip pain. Pt states unable to stand on right leg. PT has 2+ right pedal pulse. Pt able to wiggle toes.

## 2023-05-13 NOTE — H&P (Signed)
 History and Physical    Patient: Carolyn [name] ZOX:[id number] DOB: 13-Nov-1955 DOA: 05/13/2023 DOS: the patient was seen and examined on 05/13/2023 PCP: Faith Rogue, DO  Patient coming from: Home  Chief Complaint: No chief complaint on file.  HPI: Carolyn [name] is a 68 y.o. female with medical history significant of alcoholic cirrhosis, GERD, osteoporosis, chronic pancreatitis was pancreatic insufficiency, who sustained mechanical fall at home and landed on her right side.  Patient apparently just had cataract surgery.  She was at home when she tripped and fell.  She immediately has pain on the right hip.  She was brought to the ER where she was evaluated.  X-ray showed right comminuted femoral neck fracture.  Orthopedics consulted with plan to do surgical repair but at [facility] morning.  Patient being admitted to the medical service in preparation for the surgery.  She denied any other injury.  She did not hit her head.  Patient is not on blood thinners.  Review of Systems: As mentioned in the history of present illness. All other systems reviewed and are negative. 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 Ernesto Rutherford dermatology in [place].      Diabetes mellitus without complication (HCC)    Endometriosis    Gastroesophageal reflux disease without esophagitis 05/08/2017   GERD (gastroesophageal reflux disease)    Intractable nausea and vomiting 06/13/2020   Liver disease    Malabsorption    MALABSORPTION SYNDROME   Osteoporosis 03/2011   t score -2.5   Pancreatitis    due to cyst and tumors due to calcifications   Seizure (HCC)    no seizure disorder, controlled w/ meds per patient   Vitamin D deficiency 2012   VIT D 15   Past Surgical History:  Procedure Laterality Date   APPENDECTOMY      BIOPSY  01/19/2022   Procedure: BIOPSY;  Surgeon: Shellia Cleverly, DO;  Location: [facility];  Service: Gastroenterology;;   CATARACT EXTRACTION Right    CHOLECYSTECTOMY     ESOPHAGOGASTRODUODENOSCOPY (EGD) WITH PROPOFOL N/A 01/19/2022   Procedure: ESOPHAGOGASTRODUODENOSCOPY (EGD) WITH PROPOFOL;  Surgeon: Shellia Cleverly, DO;  Location: [facility];  Service: Gastroenterology;  Laterality: N/A;   FEEDING TUBE RELOCATION  2010   JEJUNOSTOMY FEEDING TUBE  1990   MULTIPLE GASTRIC SURGERIES     MYOMECTOMY     OPEN REDUCTION INTERNAL FIXATION (ORIF) DISTAL RADIAL FRACTURE Left 06/29/2018   Procedure: OPEN REDUCTION INTERNAL FIXATION (ORIF)LEFT  DISTAL RADIAL FRACTURE;  Surgeon: Betha Loa, MD;  Location: [facility];  Service: Orthopedics;  Laterality: Left;   ROUX-EN-Y GASTRIC BYPASS     TIBIA IM NAIL INSERTION Left 04/21/2022   Procedure: LEFT INTRAMEDULLARY (IM) NAIL TIBIAL;  Surgeon: Eldred Manges, MD;  Location: [facility];  Service: Orthopedics;  Laterality: Left;   TIBIA IM NAIL INSERTION Left 05/05/2022   Procedure: LEFT TIBIAL NAIL DISTAL SCREW PLACEMENT;  Surgeon: Eldred Manges, MD;  Location: [facility];  Service: Orthopedics;  Laterality: Left;   TUBAL LIGATION     Social History:  reports that she has never smoked. She has never used smokeless tobacco. She reports that she does not currently use alcohol. She reports that she does not use drugs.  Allergies  Allergen Reactions   Other Anaphylaxis   Peanut-Containing Drug  Products Anaphylaxis    Tree nuts   Pecan Extract Anaphylaxis    Family History  Problem Relation Age of Onset   Cancer Father        BONE   Breast cancer Maternal Grandmother     Prior to Admission medications   Medication Sig Start Date End Date Taking? Authorizing Provider  alendronate (FOSAMAX) 70 MG/75ML solution Take 75 mLs (70 mg total) by mouth every 7 (seven) days. Take with a full glass of water on an empty stomach. 05/10/23    Faith Rogue, DO  buprenorphine (BUTRANS) 20 MCG/HR PTWK Place 1 patch onto the skin once a week. 05/12/23   Faith Rogue, DO  Buprenorphine HCl (BELBUCA) 75 MCG FILM Place 1 film (75 mcg) inside cheek daily at 6 (six) AM. 04/14/23 05/15/23  Faith Rogue, DO  cholecalciferol (CHOLECALCIFEROL) 25 MCG tablet Take 1 tablet (1,000 Units total) by mouth daily. 04/13/22   Alwyn Ren, MD  famotidine (PEPCID) 10 MG tablet Take 10 mg by mouth 2 (two) times daily.    [provider]  feeding supplement (ENSURE ENLIVE / ENSURE PLUS) LIQD Take 237 mLs by mouth 3 (three) times daily between meals. 04/12/22   Alwyn Ren, MD  levothyroxine (SYNTHROID) 50 MCG tablet Take 1 tablet (50 mcg total) by mouth daily at 6 (six) AM. 11/24/22   Nooruddin, Jason Fila, MD  lipase/protease/amylase (CREON) 12000-38000 units CPEP capsule Take 2 capsules (24,000 Units total) by mouth 3 (three) times daily with meals. 01/27/23   Monna Fam, MD  metoprolol tartrate (LOPRESSOR) 25 MG tablet Take 1/2 tablet (12.5 mg total) by mouth 2 (two) times daily. 11/24/22 07/07/23  Nooruddin, Jason Fila, MD  Multiple Vitamin (MULITIVITAMIN WITH MINERALS) TABS Take 1 tablet by mouth daily.    [provider]  Nutritional Supplements (,FEEDING SUPPLEMENT, PROSOURCE PLUS) liquid Take 30 mLs by mouth 2 (two) times daily between meals. 04/12/22   Alwyn Ren, MD  prochlorperazine (COMPAZINE) 5 MG tablet Take 1 tablet (5 mg total) by mouth every 6 (six) hours as needed for nausea or vomiting. 04/12/23   Faith Rogue, DO  pyridOXINE (B-6) 100 MG tablet Take 1 tablet (100 mg total) by mouth daily. 04/13/22   Alwyn Ren, MD  vitamin A 3 MG (10000 UNITS) capsule Take 1 capsule (10,000 Units total) by mouth every other day. 04/13/22   Alwyn Ren, MD  vitamin B-12 (CYANOCOBALAMIN) 100 MCG tablet Take 100 mcg by mouth daily.    [provider]  Ranitidine HCl (ZANTAC PO) Take by mouth.    06/06/11   [provider]    Physical Exam: Vitals:   05/13/23 1858 05/13/23 1859 05/13/23 2227  BP: 111/71  113/65  Pulse: 87  (!) 102  Resp: 18  18  Temp: 98 F (36.7 C)  98 F (36.7 C)  TempSrc:   Oral  SpO2: 100%  100%  Weight:  45.8 kg   Height:  5\' 8"  (1.727 m)    Constitutional: Chronically ill looking, NAD, calm, comfortable Eyes: PERRL, lids and conjunctivae normal ENMT: Mucous membranes are moist. Posterior pharynx clear of any exudate or lesions.Normal dentition.  Neck: normal, supple, no masses, no thyromegaly Respiratory: clear to auscultation bilaterally, no wheezing, no crackles. Normal respiratory effort. No accessory muscle use.  Cardiovascular: Regular rate and rhythm, no murmurs / rubs / gallops. No extremity edema. 2+ pedal pulses. No carotid bruits.  Abdomen: Minimal ascites, mild hepatosplenomegaly no hepatosplenomegaly. Bowel sounds positive.  Musculoskeletal:  Right lower extremity laterally rotated and shortened, no joint swelling or tenderness, Skin: no rashes, lesions, ulcers. No induration Neurologic: CN 2-12 grossly intact. Sensation intact, DTR normal. Strength 5/5 in all 4.  Psychiatric: Normal judgment and insight. Alert and oriented x 3. Normal mood  Data Reviewed:  Chemistry largely within normal except glucose 142.  Hemoglobin 9.2 and platelets 66.  Chest x-ray showed chronic increased interstitial lung markings no acute or active pulmonary disease  Assessment and Plan:  #1 right comminuted femoral neck fracture: Following a mechanical fall.  Patient will be admitted to the medical service.  Orthopedics consulted with plan for surgical reduction tomorrow.  Will continue with pain control and supportive care.  #2 status post fall: Patient will need PT and OT consultation.  Continue to monitor  #3 alcoholic cirrhosis: Patient appears to have compensation of her cirrhosis.  Will just continue to support care.  #4 seizure disorder: Confirm on  resume home regimen  #5 hypothyroidism: Continue levothyroxine  #6 secondary pancreatic insufficiency: Continue replacement therapy  #7 anemia of chronic disease: H&H appears to be stable.  Continue to monitor    Advance Care Planning:   Code Status: Prior full code  Consults: Dr. Linna Caprice, orthopedic surgery  Family Communication: Family at bedside  Severity of Illness: The appropriate patient status for this patient is INPATIENT. Inpatient status is judged to be reasonable and necessary in order to provide the required intensity of service to ensure the patient's safety. The patient's presenting symptoms, physical exam findings, and initial radiographic and laboratory data in the context of their chronic comorbidities is felt to place them at high risk for further clinical deterioration. Furthermore, it is not anticipated that the patient will be medically stable for discharge from the hospital within 2 midnights of admission.   * I certify that at the point of admission it is my clinical judgment that the patient will require inpatient hospital care spanning beyond 2 midnights from the point of admission due to high intensity of service, high risk for further deterioration and high frequency of surveillance required.*  AuthorLonia Blood, MD 05/13/2023 11:19 PM  For on call review [email or web address].

## 2023-05-14 ENCOUNTER — Inpatient Hospital Stay (HOSPITAL_COMMUNITY)

## 2023-05-14 ENCOUNTER — Other Ambulatory Visit: Payer: Self-pay

## 2023-05-14 ENCOUNTER — Inpatient Hospital Stay (HOSPITAL_COMMUNITY): Admitting: Anesthesiology

## 2023-05-14 ENCOUNTER — Encounter (HOSPITAL_COMMUNITY)
Admission: EM | Disposition: A | Discharge status: Home / Self Care | Payer: Self-pay | Source: Home / Self Care | Attending: Student

## 2023-05-14 DIAGNOSIS — S72002A Fracture of unspecified part of neck of left femur, initial encounter for closed fracture: Secondary | ICD-10-CM | POA: Insufficient documentation

## 2023-05-14 DIAGNOSIS — I10 Essential (primary) hypertension: Secondary | ICD-10-CM

## 2023-05-14 DIAGNOSIS — S72001A Fracture of unspecified part of neck of right femur, initial encounter for closed fracture: Secondary | ICD-10-CM

## 2023-05-14 DIAGNOSIS — S72031D Displaced midcervical fracture of right femur, subsequent encounter for closed fracture with routine healing: Secondary | ICD-10-CM

## 2023-05-14 DIAGNOSIS — E119 Type 2 diabetes mellitus without complications: Secondary | ICD-10-CM | POA: Diagnosis not present

## 2023-05-14 DIAGNOSIS — S8291XD Unspecified fracture of right lower leg, subsequent encounter for closed fracture with routine healing: Secondary | ICD-10-CM

## 2023-05-14 DIAGNOSIS — E039 Hypothyroidism, unspecified: Secondary | ICD-10-CM

## 2023-05-14 DIAGNOSIS — R5381 Other malaise: Secondary | ICD-10-CM

## 2023-05-14 HISTORY — PX: FEMUR IM NAIL: SHX1597

## 2023-05-14 LAB — PREPARE RBC (CROSSMATCH)

## 2023-05-14 LAB — GLUCOSE, CAPILLARY
Glucose-Capillary: 161 mg/dL — ABNORMAL HIGH (ref 70–99)
Glucose-Capillary: 185 mg/dL — ABNORMAL HIGH (ref 70–99)

## 2023-05-14 LAB — CBC
HCT: 23 % — ABNORMAL LOW (ref 36.0–46.0)
HCT: 25.8 % — ABNORMAL LOW (ref 36.0–46.0)
Hemoglobin: 7.8 g/dL — ABNORMAL LOW (ref 12.0–15.0)
Hemoglobin: 8.6 g/dL — ABNORMAL LOW (ref 12.0–15.0)
MCH: 33.5 pg (ref 26.0–34.0)
MCH: 32.2 pg (ref 26.0–34.0)
MCHC: 33.9 g/dL (ref 30.0–36.0)
MCHC: 33.3 g/dL (ref 30.0–36.0)
MCV: 98.7 fL (ref 80.0–100.0)
MCV: 96.6 fL (ref 80.0–100.0)
Platelets: 61 10*3/uL — ABNORMAL LOW (ref 150–400)
Platelets: 62 10*3/uL — ABNORMAL LOW (ref 150–400)
RBC: 2.33 MIL/uL — ABNORMAL LOW (ref 3.87–5.11)
RBC: 2.67 MIL/uL — ABNORMAL LOW (ref 3.87–5.11)
RDW: 13 % (ref 11.5–15.5)
RDW: 16.4 % — ABNORMAL HIGH (ref 11.5–15.5)
WBC: 5.4 10*3/uL (ref 4.0–10.5)
WBC: 6.2 10*3/uL (ref 4.0–10.5)
nRBC: 0 % (ref 0.0–0.2)
nRBC: 0 % (ref 0.0–0.2)

## 2023-05-14 LAB — HEMOGLOBIN A1C
Hgb A1c MFr Bld: 5.3 % (ref 4.8–5.6)
Mean Plasma Glucose: 105.41 mg/dL

## 2023-05-14 LAB — COMPREHENSIVE METABOLIC PANEL
ALT: 29 U/L (ref 0–44)
AST: 55 U/L — ABNORMAL HIGH (ref 15–41)
Albumin: 2.6 g/dL — ABNORMAL LOW (ref 3.5–5.0)
Alkaline Phosphatase: 84 U/L (ref 38–126)
Anion gap: 9 (ref 5–15)
BUN: 18 mg/dL (ref 8–23)
CO2: 23 mmol/L (ref 22–32)
Calcium: 7.6 mg/dL — ABNORMAL LOW (ref 8.9–10.3)
Chloride: 104 mmol/L (ref 98–111)
Creatinine, Ser: 0.7 mg/dL (ref 0.44–1.00)
GFR, Estimated: >60 mL/min (ref >60)
Glucose, Bld: 166 mg/dL — ABNORMAL HIGH (ref 70–99)
Potassium: 4 mmol/L (ref 3.5–5.1)
Sodium: 136 mmol/L (ref 135–145)
Total Bilirubin: 1.4 mg/dL — ABNORMAL HIGH (ref 0.0–1.2)
Total Protein: 5.1 g/dL — ABNORMAL LOW (ref 6.5–8.1)

## 2023-05-14 LAB — HIV ANTIBODY (ROUTINE TESTING W REFLEX): HIV Screen 4th Generation wRfx: NONREACTIVE

## 2023-05-14 SURGERY — INSERTION, INTRAMEDULLARY ROD, FEMUR
Anesthesia: General | Site: Hip | Laterality: Right

## 2023-05-14 MED ORDER — TRANEXAMIC ACID-NACL 1000-0.7 MG/100ML-% IV SOLN
INTRAVENOUS | Status: AC
Start: 2023-05-14 — End: 2023-05-14
  Filled 2023-05-14: qty 100

## 2023-05-14 MED ORDER — BISACODYL 10 MG RE SUPP: 10.0000 mg | Freq: Every day | RECTAL | Status: DC | PRN

## 2023-05-14 MED ORDER — CEFAZOLIN SODIUM-DEXTROSE 2-4 GM/100ML-% IV SOLN
2.0000 g | INTRAVENOUS | Status: AC
  Administered 2023-05-14: 1 g via INTRAVENOUS
  Administered 2023-05-14: 2 g via INTRAVENOUS

## 2023-05-14 MED ORDER — FENTANYL CITRATE PF 50 MCG/ML IJ SOSY
PREFILLED_SYRINGE | INTRAMUSCULAR | Status: AC
  Filled 2023-05-14: qty 1

## 2023-05-14 MED ORDER — METHOCARBAMOL 500 MG PO TABS
500.0000 mg | ORAL_TABLET | Freq: Three times a day (TID) | ORAL | Status: DC | PRN
  Administered 2023-05-14 – 2023-05-17 (×6): 500 mg via ORAL
  Filled 2023-05-14 (×7): qty 1

## 2023-05-14 MED ORDER — CHLORHEXIDINE GLUCONATE 4 % EX SOLN: 60.0000 mL | Freq: Once | CUTANEOUS | Status: DC

## 2023-05-14 MED ORDER — CEFAZOLIN SODIUM-DEXTROSE 2-4 GM/100ML-% IV SOLN
2.0000 g | Freq: Four times a day (QID) | INTRAVENOUS | Status: AC
  Administered 2023-05-14 (×2): 2 g via INTRAVENOUS
  Filled 2023-05-14 (×2): qty 100

## 2023-05-14 MED ORDER — PHENOL 1.4 % MT LIQD: 1.0000 | OROMUCOSAL | Status: DC | PRN

## 2023-05-14 MED ORDER — LIDOCAINE HCL (CARDIAC) PF 100 MG/5ML IV SOSY
PREFILLED_SYRINGE | INTRAVENOUS | Status: DC | PRN
  Administered 2023-05-14: 60 mg via INTRAVENOUS

## 2023-05-14 MED ORDER — SUGAMMADEX SODIUM 200 MG/2ML IV SOLN
INTRAVENOUS | Status: DC | PRN
Start: 2023-05-14 — End: 2023-05-14
  Administered 2023-05-14: 200 mg via INTRAVENOUS

## 2023-05-14 MED ORDER — PROPOFOL 10 MG/ML IV BOLUS
INTRAVENOUS | Status: AC
  Filled 2023-05-14: qty 20

## 2023-05-14 MED ORDER — DOCUSATE SODIUM 100 MG PO CAPS
100.0000 mg | ORAL_CAPSULE | Freq: Two times a day (BID) | ORAL | Status: DC
  Administered 2023-05-14 – 2023-05-17 (×6): 100 mg via ORAL
  Filled 2023-05-14 (×6): qty 1

## 2023-05-14 MED ORDER — FENTANYL CITRATE PF 50 MCG/ML IJ SOSY
25.0000 ug | PREFILLED_SYRINGE | INTRAMUSCULAR | Status: DC | PRN
  Administered 2023-05-14 (×2): 50 ug via INTRAVENOUS

## 2023-05-14 MED ORDER — ROCURONIUM BROMIDE 100 MG/10ML IV SOLN
INTRAVENOUS | Status: DC | PRN
  Administered 2023-05-14: 50 mg via INTRAVENOUS

## 2023-05-14 MED ORDER — FENTANYL CITRATE (PF) 100 MCG/2ML IJ SOLN
INTRAMUSCULAR | Status: DC | PRN
  Administered 2023-05-14: 100 ug via INTRAVENOUS

## 2023-05-14 MED ORDER — METOCLOPRAMIDE HCL 5 MG PO TABS: 5.0000 mg | ORAL_TABLET | Freq: Three times a day (TID) | ORAL | Status: DC | PRN

## 2023-05-14 MED ORDER — HYDROCODONE-ACETAMINOPHEN 5-325 MG PO TABS
1.0000 | ORAL_TABLET | ORAL | 0 refills | Status: AC | PRN
  Filled 2023-05-17: qty 42, 7d supply, fill #0

## 2023-05-14 MED ORDER — LACTATED RINGERS IV SOLN: INTRAVENOUS | Status: DC

## 2023-05-14 MED ORDER — CEFAZOLIN SODIUM-DEXTROSE 2-4 GM/100ML-% IV SOLN: 2.0000 g | INTRAVENOUS | Status: DC

## 2023-05-14 MED ORDER — MIDAZOLAM HCL 5 MG/5ML IJ SOLN
INTRAMUSCULAR | Status: DC | PRN
  Administered 2023-05-14: 2 mg via INTRAVENOUS

## 2023-05-14 MED ORDER — SENNA 8.6 MG PO TABS
1.0000 | ORAL_TABLET | Freq: Two times a day (BID) | ORAL | Status: DC
  Administered 2023-05-14 – 2023-05-17 (×6): 8.6 mg via ORAL
  Filled 2023-05-14 (×7): qty 1

## 2023-05-14 MED ORDER — HYDROMORPHONE HCL 2 MG/ML IJ SOLN
INTRAMUSCULAR | Status: AC
Start: 2023-05-14 — End: ?
  Filled 2023-05-14: qty 1

## 2023-05-14 MED ORDER — ACETAMINOPHEN 10 MG/ML IV SOLN
INTRAVENOUS | Status: AC
Start: 2023-05-14 — End: 2023-05-14
  Filled 2023-05-14: qty 100

## 2023-05-14 MED ORDER — POVIDONE-IODINE 10 % EX SWAB: 2.0000 | Freq: Once | CUTANEOUS | Status: DC

## 2023-05-14 MED ORDER — ISOPROPYL ALCOHOL 70 % SOLN
Status: DC | PRN
  Administered 2023-05-14: 1 via TOPICAL

## 2023-05-14 MED ORDER — EPHEDRINE SULFATE-NACL 50-0.9 MG/10ML-% IV SOSY
PREFILLED_SYRINGE | INTRAVENOUS | Status: DC | PRN
  Administered 2023-05-14: 10 mg via INTRAVENOUS
  Administered 2023-05-14: 5 mg via INTRAVENOUS

## 2023-05-14 MED ORDER — DEXAMETHASONE SODIUM PHOSPHATE 10 MG/ML IJ SOLN
INTRAMUSCULAR | Status: DC | PRN
  Administered 2023-05-14: 8 mg via INTRAVENOUS

## 2023-05-14 MED ORDER — MENTHOL 3 MG MT LOZG: 1.0000 | LOZENGE | OROMUCOSAL | Status: DC | PRN

## 2023-05-14 MED ORDER — HYDROCODONE-ACETAMINOPHEN 7.5-325 MG PO TABS
1.0000 | ORAL_TABLET | ORAL | Status: DC | PRN
  Administered 2023-05-16 (×4): 2 via ORAL
  Administered 2023-05-17 (×2): 1 via ORAL
  Administered 2023-05-17: 2 via ORAL
  Filled 2023-05-14: qty 2
  Filled 2023-05-14: qty 1
  Filled 2023-05-14: qty 2
  Filled 2023-05-14: qty 1
  Filled 2023-05-14 (×3): qty 2

## 2023-05-14 MED ORDER — ACETAMINOPHEN 10 MG/ML IV SOLN: 1000.0000 mg | Freq: Once | INTRAVENOUS | Status: DC | PRN

## 2023-05-14 MED ORDER — METOCLOPRAMIDE HCL 5 MG/ML IJ SOLN: 5.0000 mg | Freq: Three times a day (TID) | INTRAMUSCULAR | Status: DC | PRN

## 2023-05-14 MED ORDER — SODIUM CHLORIDE 0.9% IV SOLUTION: Freq: Once | INTRAVENOUS | Status: DC

## 2023-05-14 MED ORDER — POLYETHYLENE GLYCOL 3350 17 G PO PACK
17.0000 g | PACK | Freq: Two times a day (BID) | ORAL | Status: AC
  Administered 2023-05-15 (×2): 17 g via ORAL
  Filled 2023-05-14 (×2): qty 1

## 2023-05-14 MED ORDER — LACTATED RINGERS IV SOLN: INTRAVENOUS | Status: DC | PRN

## 2023-05-14 MED ORDER — SODIUM CHLORIDE 0.9 % IV SOLN: INTRAVENOUS | Status: AC

## 2023-05-14 MED ORDER — HYDROCODONE-ACETAMINOPHEN 5-325 MG PO TABS
1.0000 | ORAL_TABLET | ORAL | Status: DC | PRN
Start: 2023-05-14 — End: 2023-05-17
  Administered 2023-05-14 – 2023-05-15 (×6): 2 via ORAL
  Filled 2023-05-14 (×6): qty 2

## 2023-05-14 MED ORDER — TRANEXAMIC ACID-NACL 1000-0.7 MG/100ML-% IV SOLN
1000.0000 mg | INTRAVENOUS | Status: AC
  Administered 2023-05-14: 1000 mg via INTRAVENOUS

## 2023-05-14 MED ORDER — CEFAZOLIN SODIUM-DEXTROSE 2-4 GM/100ML-% IV SOLN
INTRAVENOUS | Status: AC
Start: 2023-05-14 — End: 2023-05-14
  Filled 2023-05-14: qty 100

## 2023-05-14 MED ORDER — MORPHINE SULFATE (PF) 2 MG/ML IV SOLN
0.5000 mg | INTRAVENOUS | Status: DC | PRN
  Administered 2023-05-14 – 2023-05-16 (×5): 1 mg via INTRAVENOUS
  Filled 2023-05-14 (×5): qty 1

## 2023-05-14 MED ORDER — ACETAMINOPHEN 325 MG PO TABS: 325.0000 mg | ORAL_TABLET | Freq: Four times a day (QID) | ORAL | Status: DC | PRN

## 2023-05-14 MED ORDER — LIDOCAINE HCL (PF) 2 % IJ SOLN
INTRAMUSCULAR | Status: AC
  Filled 2023-05-14: qty 5

## 2023-05-14 MED ORDER — PROPOFOL 10 MG/ML IV BOLUS
INTRAVENOUS | Status: DC | PRN
  Administered 2023-05-14: 120 mg via INTRAVENOUS

## 2023-05-14 MED ORDER — TRANEXAMIC ACID-NACL 1000-0.7 MG/100ML-% IV SOLN: 1000.0000 mg | INTRAVENOUS | Status: DC

## 2023-05-14 MED ORDER — POLYETHYLENE GLYCOL 3350 17 G PO PACK: 17.0000 g | PACK | Freq: Every day | ORAL | Status: DC | PRN

## 2023-05-14 MED ORDER — SODIUM CHLORIDE 0.9 % IR SOLN
Status: DC | PRN
  Administered 2023-05-14: 1000 mL

## 2023-05-14 MED ORDER — MIDAZOLAM HCL 2 MG/2ML IJ SOLN
INTRAMUSCULAR | Status: AC
  Filled 2023-05-14: qty 2

## 2023-05-14 MED ORDER — FENTANYL CITRATE (PF) 100 MCG/2ML IJ SOLN
INTRAMUSCULAR | Status: AC
  Filled 2023-05-14: qty 2

## 2023-05-14 MED ORDER — PHENYLEPHRINE 80 MCG/ML (10ML) SYRINGE FOR IV PUSH (FOR BLOOD PRESSURE SUPPORT)
PREFILLED_SYRINGE | INTRAVENOUS | Status: DC | PRN
  Administered 2023-05-14 (×3): 160 ug via INTRAVENOUS
  Administered 2023-05-14: 120 ug via INTRAVENOUS

## 2023-05-14 MED ORDER — ACETAMINOPHEN 10 MG/ML IV SOLN
INTRAVENOUS | Status: AC
  Filled 2023-05-14: qty 100

## 2023-05-14 MED ORDER — SUGAMMADEX SODIUM 200 MG/2ML IV SOLN
INTRAVENOUS | Status: AC
  Filled 2023-05-14: qty 2

## 2023-05-14 SURGICAL SUPPLY — 44 items
BAG COUNTER SPONGE SURGICOUNT (BAG) IMPLANT
BAG ZIPLOCK 12X15 (MISCELLANEOUS) IMPLANT
BIT DRILL CANN LG 4.3MM (BIT) IMPLANT
CHLORAPREP W/TINT 26 (MISCELLANEOUS) ×2 IMPLANT
COVER PERINEAL POST (MISCELLANEOUS) ×2 IMPLANT
COVER SURGICAL LIGHT HANDLE (MISCELLANEOUS) ×2 IMPLANT
DERMABOND ADVANCED .7 DNX12 (GAUZE/BANDAGES/DRESSINGS) ×2 IMPLANT
DRAPE C-ARM 42X120 X-RAY (DRAPES) ×2 IMPLANT
DRAPE C-ARMOR (DRAPES) ×2 IMPLANT
DRAPE SHEET LG 3/4 BI-LAMINATE (DRAPES) ×2 IMPLANT
DRAPE STERI IOBAN 125X83 (DRAPES) ×2 IMPLANT
DRAPE U-SHAPE 47X51 STRL (DRAPES) ×4 IMPLANT
DRESSING AQUACEL AG SP 3.5X4 (GAUZE/BANDAGES/DRESSINGS) IMPLANT
DRESSING AQUACEL AG SP 3.5X6 (GAUZE/BANDAGES/DRESSINGS) IMPLANT
DRILL BIT CANN LG 4.3MM (BIT) ×1 IMPLANT
DRSG AQUACEL AG ADV 3.5X 6 (GAUZE/BANDAGES/DRESSINGS) ×2 IMPLANT
DRSG AQUACEL AG ADV 3.5X10 (GAUZE/BANDAGES/DRESSINGS) ×2 IMPLANT
DRSG AQUACEL AG SP 3.5X4 (GAUZE/BANDAGES/DRESSINGS) ×1 IMPLANT
DRSG AQUACEL AG SP 3.5X6 (GAUZE/BANDAGES/DRESSINGS) ×1 IMPLANT
GAUZE SPONGE 4X4 12PLY STRL (GAUZE/BANDAGES/DRESSINGS) ×2 IMPLANT
GLOVE BIO SURGEON STRL SZ7 (GLOVE) ×2 IMPLANT
GLOVE BIO SURGEON STRL SZ8.5 (GLOVE) ×4 IMPLANT
GLOVE BIOGEL PI IND STRL 7.5 (GLOVE) ×2 IMPLANT
GLOVE BIOGEL PI IND STRL 8.5 (GLOVE) ×2 IMPLANT
GOWN SPEC L3 XXLG W/TWL (GOWN DISPOSABLE) ×2 IMPLANT
GOWN STRL REUS W/ TWL XL LVL3 (GOWN DISPOSABLE) ×2 IMPLANT
GUIDEPIN VERSANAIL DSP 3.2X444 (ORTHOPEDIC DISPOSABLE SUPPLIES) IMPLANT
HIP FRA NAIL LAG SCREW 10.5X90 (Orthopedic Implant) ×1 IMPLANT
HOOD PEEL AWAY T7 (MISCELLANEOUS) ×6 IMPLANT
KIT BASIN OR (CUSTOM PROCEDURE TRAY) ×2 IMPLANT
KIT TURNOVER KIT A (KITS) IMPLANT
MANIFOLD NEPTUNE II (INSTRUMENTS) ×2 IMPLANT
MARKER SKIN DUAL TIP RULER LAB (MISCELLANEOUS) ×2 IMPLANT
NAIL HIP FRACT 130D 9X180 (Orthopedic Implant) IMPLANT
PACK GENERAL/GYN (CUSTOM PROCEDURE TRAY) ×2 IMPLANT
SCREW BONE CORTICAL 5.0X32 (Screw) IMPLANT
SCREW LAG HIP FRA NAIL 10.5X90 (Orthopedic Implant) IMPLANT
SUT MNCRL AB 3-0 PS2 18 (SUTURE) IMPLANT
SUT MON AB 2-0 CT1 36 (SUTURE) ×2 IMPLANT
SUT VIC AB 1 CT1 36 (SUTURE) ×2 IMPLANT
TOWEL GREEN STERILE FF (TOWEL DISPOSABLE) ×2 IMPLANT
TOWEL OR 17X26 10 PK STRL BLUE (TOWEL DISPOSABLE) ×2 IMPLANT
TRAY FOLEY MTR SLVR 14FR STAT (SET/KITS/TRAYS/PACK) ×2 IMPLANT
YANKAUER SUCT BULB TIP NO VENT (SUCTIONS) IMPLANT

## 2023-05-14 NOTE — Anesthesia Preprocedure Evaluation (Signed)
 Anesthesia Evaluation  Patient identified by MRN, date of birth, ID band Patient awake    Reviewed: Allergy & Precautions, NPO status , Patient's Chart, lab work & pertinent test results  Airway Mallampati: II  TM Distance: >3 FB Neck ROM: Full    Dental no notable dental hx.    Pulmonary neg pulmonary ROS   Pulmonary exam normal        Cardiovascular hypertension, Pt. on medications and Pt. on home beta blockers  Rhythm:Regular Rate:Normal     Neuro/Psych Seizures -, Well Controlled,   negative psych ROS   GI/Hepatic PUD,GERD  ,,(+) Cirrhosis         Endo/Other  diabetesHypothyroidism    Renal/GU negative Renal ROS  negative genitourinary   Musculoskeletal Hip fracture    Abdominal Normal abdominal exam  (+)   Peds  Hematology  (+) Blood dyscrasia, anemia   Anesthesia Other Findings   Reproductive/Obstetrics                             Anesthesia Physical Anesthesia Plan  ASA: 3  Anesthesia Plan: General   Post-op Pain Management:    Induction: Intravenous  PONV Risk Score and Plan: 3 and Ondansetron, Dexamethasone, Midazolam and Treatment may vary due to age or medical condition  Airway Management Planned: Mask and Oral ETT  Additional Equipment: None  Intra-op Plan:   Post-operative Plan: Extubation in OR  Informed Consent: I have reviewed the patients History and Physical, chart, labs and discussed the procedure including the risks, benefits and alternatives for the proposed anesthesia with the patient or authorized representative who has indicated his/her understanding and acceptance.     Dental advisory given  Plan Discussed with: CRNA  Anesthesia Plan Comments:        Anesthesia Quick Evaluation

## 2023-05-14 NOTE — Anesthesia Procedure Notes (Signed)
 Procedure Name: Intubation Date/Time: 05/14/2023 11:28 AM  Performed by: Randa Evens, CRNAPre-anesthesia Checklist: Patient identified, Emergency Drugs available, Suction available and Patient being monitored Patient Re-evaluated:Patient Re-evaluated prior to induction Oxygen Delivery Method: Circle System Utilized Preoxygenation: Pre-oxygenation with 100% oxygen Induction Type: IV induction Ventilation: Mask ventilation without difficulty Laryngoscope Size: Mac and 3 Grade View: Grade I Tube type: Oral Tube size: 7.0 mm Number of attempts: 1 Airway Equipment and Method: Stylet and Oral airway Placement Confirmation: ETT inserted through vocal cords under direct vision, positive ETCO2 and breath sounds checked- equal and bilateral Secured at: 22 cm Tube secured with: Tape Dental Injury: Teeth and Oropharynx as per pre-operative assessment

## 2023-05-14 NOTE — Transfer of Care (Signed)
 Immediate Anesthesia Transfer of Care Note  Patient: Carolyn [name]  Procedure(s) Performed: INTRAMEDULLARY (IM) NAIL FEMORAL, RIGHT HIP (Right: Hip)  Patient Location: PACU  Anesthesia Type:General  Level of Consciousness: drowsy  Airway & Oxygen Therapy: Patient Spontanous Breathing  Post-op Assessment: Report given to RN  Post vital signs: Reviewed and stable  Last Vitals:  Vitals Value Taken Time  BP 123/64 05/14/23 1252  Temp    Pulse 97 05/14/23 1256  Resp 12 05/14/23 1256  SpO2 100 % 05/14/23 1256  Vitals shown include unfiled device data.  Last Pain:  Vitals:   05/14/23 0950  TempSrc:   PainSc: 8          Complications: No notable events documented.

## 2023-05-14 NOTE — Progress Notes (Signed)
   05/14/23 1449  Vitals  Temp 97.9 F (36.6 C)  Temp Source Oral  BP 102/66  MAP (mmHg) 76  BP Location Left Arm  BP Method Automatic  Patient Position (if appropriate) Lying  Pulse Rate (!) 108  Pulse Rate Source Monitor  Level of Consciousness  Level of Consciousness Alert  Oxygen Therapy  SpO2 99 %  O2 Device Room Air   New admit from [employer]

## 2023-05-14 NOTE — Op Note (Signed)
 OPERATIVE REPORT  SURGEON: Samson Frederic, MD   ASSISTANT: Clint Bolder, PA-C.  PREOPERATIVE DIAGNOSIS: Right intertrochanteric femur fracture.   POSTOPERATIVE DIAGNOSIS: Right intertrochanteric femur fracture.   PROCEDURE: Intramedullary fixation, Right femur.   IMPLANTS: Biomet Affixus Hip Fracture Nail, 9 by 180 mm, 125 degrees. 10.5 x 90 mm Hip Fracture Nail Lag Screw. 5 x 32 mm distal interlocking screw 1.  ANESTHESIA:  General  ESTIMATED BLOOD LOSS:-10 mL    ANTIBIOTICS:  2 g Ancef.  DRAINS: None.  COMPLICATIONS: None.   CONDITION: PACU - hemodynamically stable.   BRIEF CLINICAL NOTE: ROSSELYN [name] is a 68 y.o. female who presented with an intertrochanteric femur fracture. The patient was admitted to the hospitalist service and underwent perioperative risk stratification and medical optimization. The risks, benefits, and alternatives to the procedure were explained, and the patient elected to proceed.  PROCEDURE IN DETAIL: Surgical site was marked by myself. The patient was taken to the operating room and anesthesia was induced on the bed. The patient was then transferred to the [facility] table and the nonoperative lower extremity was scissored underneath the operative side. The fracture was reduced with traction, internal rotation, and adduction. The hip was prepped and draped in the normal sterile surgical fashion. Timeout was called verifying side and site of surgery. Preop antibiotics were given with 60 minutes of beginning the procedure.  Fluoroscopy was used to define the patient's anatomy. A 4 cm incision was made just proximal to the tip of the greater trochanter. The awl was used to obtain the standard starting point for a trochanteric entry nail under fluoroscopic control. The guidepin was placed. The entry reamer was used to open the proximal femur.  On the back table, the nail was assembled onto the jig. The nail was placed into the femur without any difficulty. Through  a separate stab incision, the cannula was placed down to the bone in preparation for the cephalomedullary device. A guidepin was placed into the femoral head using AP and lateral fluoroscopy views. The pin was measured, and then reaming was performed to the appropriate depth. The lag screw was inserted to the appropriate depth. The fracture was compressed through the jig. The setscrew was tightened and then loosened one quarter turn. A separate stab incision was created, and the distal interlocking screw was placed using standard AO technique. The jig was removed. Final AP and lateral fluoroscopy views were obtained to confirm fracture reduction and hardware placement. Tip apex distance was appropriate. There was no chondral penetration.  The wounds were copiously irrigated with saline. The wound was closed in layers with #1 Vicryl for the fascia, 2-0 Monocryl for the deep dermal layer, and staples + Dermabond for the skin. Once the glue was fully hardened, sterile dressing was applied. The patient was then awakened from anesthesia and taken to the PACU in stable condition. Sponge needle and instrument counts were correct at the end of the case 2. There were no known complications.  We will readmit the patient to the hospitalist. Weightbearing status will be weightbearing as tolerated with a walker. We will hold chemical DVT prophylaxis due to liver disease, thrombocytopenia, and high risk for bleeding. The patient will mobilize out of bed with physical therapy and undergo disposition planning. Hold Fosamax for 6-12 months postoperatively.  Please note that a surgical assistant was a medical necessity for this procedure to perform it in a safe and expeditious manner. Assistant was necessary to provide appropriate retraction of vital neurovascular structures, to  prevent femoral fracture, and to allow for anatomic placement of the prosthesis.

## 2023-05-14 NOTE — Discharge Instructions (Signed)
 Dr. Brian Swinteck Adult Hip & Knee Specialist [facility] [address] [place], [place] [address] [phone]   POSTOPERATIVE DIRECTIONS    Hip Rehabilitation, Guidelines Following Surgery   WEIGHT BEARING Weight bearing as tolerated with assist device (walker, cane, etc) as directed, use it as long as suggested by your surgeon or therapist, typically at least 4-6 weeks.   HOME CARE INSTRUCTIONS  Remove items at home which could result in a fall. This includes throw rugs or furniture in walking pathways.  Continue medications as instructed at time of discharge.  You may have some home medications which will be placed on hold until you complete the course of blood thinner medication.  4 days after discharge, you may start showering. No tub baths or soaking your incisions. Do not put on socks or shoes without following the instructions of your caregivers.   Sit on chairs with arms. Use the chair arms to help push yourself up when arising.  Arrange for the use of a toilet seat elevator so you are not sitting low.   Walk with walker as instructed.  You may resume a sexual relationship in one month or when given the OK by your caregiver.  Use walker as long as suggested by your caregivers.  Avoid periods of inactivity such as sitting longer than an hour when not asleep. This helps prevent blood clots.  You may return to work once you are cleared by your surgeon.  Do not drive a car for 6 weeks or until released by your surgeon.  Do not drive while taking narcotics.  Wear elastic stockings for two weeks following surgery during the day but you may remove then at night.  Make sure you keep all of your appointments after your operation with all of your doctors and caregivers. You should call the office at the above phone number and make an appointment for approximately two weeks after the date of your surgery. Please pick up a stool softener and laxative  for home use as long as you are requiring pain medications.  ICE to the affected hip every three hours for 30 minutes at a time and then as needed for pain and swelling. Continue to use ice on the hip for pain and swelling from surgery. You may notice swelling that will progress down to the foot and ankle.  This is normal after surgery.  Elevate the leg when you are not up walking on it.   It is important for you to complete the blood thinner medication as prescribed by your doctor.  Continue to use the breathing machine which will help keep your temperature down.  It is common for your temperature to cycle up and down following surgery, especially at night when you are not up moving around and exerting yourself.  The breathing machine keeps your lungs expanded and your temperature down.  RANGE OF MOTION AND STRENGTHENING EXERCISES  These exercises are designed to help you keep full movement of your hip joint. Follow your caregiver's or physical therapist's instructions. Perform all exercises about fifteen times, three times per day or as directed. Exercise both hips, even if you have had only one joint replacement. These exercises can be done on a training (exercise) mat, on the floor, on a table or on a bed. Use whatever works the best and is most comfortable for you. Use music or television while you are exercising so that the exercises are a pleasant break in your day. This   will make your life better with the exercises acting as a break in routine you can look forward to.  Lying on your back, slowly slide your foot toward your buttocks, raising your knee up off the floor. Then slowly slide your foot back down until your leg is straight again.  Lying on your back spread your legs as far apart as you can without causing discomfort.  Lying on your side, raise your upper leg and foot straight up from the floor as far as is comfortable. Slowly lower the leg and repeat.  Lying on your back, tighten up the  muscle in the front of your thigh (quadriceps muscles). You can do this by keeping your leg straight and trying to raise your heel off the floor. This helps strengthen the largest muscle supporting your knee.  Lying on your back, tighten up the muscles of your buttocks both with the legs straight and with the knee bent at a comfortable angle while keeping your heel on the floor.   SKILLED REHAB INSTRUCTIONS: If the patient is transferred to a skilled rehab facility following release from the hospital, a list of the current medications will be sent to the facility for the patient to continue.  When discharged from the skilled rehab facility, please have the facility set up the patient's Home Health Physical Therapy prior to being released. Also, the skilled facility will be responsible for providing the patient with their medications at time of release from the facility to include their pain medication and their blood thinner medication. If the patient is still at the rehab facility at time of the two week follow up appointment, the skilled rehab facility will also need to assist the patient in arranging follow up appointment in our office and any transportation needs.  MAKE SURE YOU:  Understand these instructions.  Will watch your condition.  Will get help right away if you are not doing well or get worse.  Pick up stool softner and laxative for home use following surgery while on pain medications. Daily dry dressing changes as needed. In 4 days, you may remove your dressings and begin taking showers - no tub baths or soaking the incisions. Continue to use ice for pain and swelling after surgery. Do not use any lotions or creams on the incision until instructed by your surgeon.   

## 2023-05-14 NOTE — Consult Note (Signed)
 ORTHOPAEDIC CONSULTATION  REQUESTING PHYSICIAN: Samson Frederic, MD  PCP:  Faith Rogue, DO  Chief Complaint: right hip injury  HPI: Carolyn [name] is a 68 y.o. female with PMHx of osteoporosis, chronic perforated gastric ulcer, alcoholic cirrhosis, chronic pancreatitis with chronic pancreatic insufficiency who sustained a mechanical ground-level fall, injuring her right hip.  She had immediate right hip pain and inability to weight-bear.  She was brought to the emergency department, where x-rays revealed a comminuted displaced right intertrochanteric femur fracture.  She was admitted by [facility] for perioperative restratification and medical optimization.  Orthopedic consultation was placed for management of her right hip fracture.  She denies other injuries.  Hemoglobin this morning 7.8, and she was given 1 unit packed red blood cells.  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 Ernesto Rutherford dermatology in [place].      Diabetes mellitus without complication (HCC)    Endometriosis    Gastroesophageal reflux disease without esophagitis 05/08/2017   GERD (gastroesophageal reflux disease)    Intractable nausea and vomiting 06/13/2020   Liver disease    Malabsorption    MALABSORPTION SYNDROME   Osteoporosis 03/2011   t score -2.5   Pancreatitis    due to cyst and tumors due to calcifications   Seizure (HCC)    no seizure disorder, controlled w/ meds per patient   Vitamin D deficiency 2012   VIT D 15   Past Surgical History:  Procedure Laterality Date   APPENDECTOMY     BIOPSY  01/19/2022   Procedure: BIOPSY;  Surgeon: Shellia Cleverly, DO;  Location: [facility];  Service: Gastroenterology;;   CATARACT EXTRACTION Right    CHOLECYSTECTOMY     ESOPHAGOGASTRODUODENOSCOPY (EGD) WITH PROPOFOL N/A 01/19/2022    Procedure: ESOPHAGOGASTRODUODENOSCOPY (EGD) WITH PROPOFOL;  Surgeon: Shellia Cleverly, DO;  Location: [facility];  Service: Gastroenterology;  Laterality: N/A;   FEEDING TUBE RELOCATION  2010   JEJUNOSTOMY FEEDING TUBE  1990   MULTIPLE GASTRIC SURGERIES     MYOMECTOMY     OPEN REDUCTION INTERNAL FIXATION (ORIF) DISTAL RADIAL FRACTURE Left 06/29/2018   Procedure: OPEN REDUCTION INTERNAL FIXATION (ORIF)LEFT  DISTAL RADIAL FRACTURE;  Surgeon: Betha Loa, MD;  Location: [facility];  Service: Orthopedics;  Laterality: Left;   ROUX-EN-Y GASTRIC BYPASS     TIBIA IM NAIL INSERTION Left 04/21/2022   Procedure: LEFT INTRAMEDULLARY (IM) NAIL TIBIAL;  Surgeon: Eldred Manges, MD;  Location: [facility];  Service: Orthopedics;  Laterality: Left;   TIBIA IM NAIL INSERTION Left 05/05/2022   Procedure: LEFT TIBIAL NAIL DISTAL SCREW PLACEMENT;  Surgeon: Eldred Manges, MD;  Location: [facility];  Service: Orthopedics;  Laterality: Left;   TUBAL LIGATION     Social History   Socioeconomic History   Marital status: Single    Spouse name: Not on file   Number of children: Not on file   Years of education: Not on file   Highest education level: Not on file  Occupational History   Not on file  Tobacco Use   Smoking status: Never   Smokeless tobacco: Never  Vaping Use   Vaping status: Never Used  Substance and Sexual Activity   Alcohol use: Not Currently    Comment: occ ( none in several weeks)   Drug use: No   Sexual  activity: Not Currently    Partners: Male    Birth control/protection: Post-menopausal  Other Topics Concern   Not on file  Social History Narrative   Not on file   Social Drivers of Health   Financial Resource Strain: Low Risk  (07/13/2022)   Overall Financial Resource Strain (CARDIA)    Difficulty of Paying Living Expenses: Not hard at all  Food Insecurity: No Food Insecurity (07/13/2022)   Hunger Vital Sign    Worried About Running Out of Food in the Last Year:  Never true    Ran Out of Food in the Last Year: Never true  Transportation Needs: No Transportation Needs (07/13/2022)   PRAPARE - Administrator, Civil Service (Medical): No    Lack of Transportation (Non-Medical): No  Physical Activity: Insufficiently Active (07/13/2022)   Exercise Vital Sign    Days of Exercise per Week: 2 days    Minutes of Exercise per Session: 40 min  Stress: No Stress Concern Present (07/13/2022)   [employer] of Occupational Health - Occupational Stress Questionnaire    Feeling of Stress : Not at all  Social Connections: Moderately Isolated (07/13/2022)   Social Connection and Isolation Panel [NHANES]    Frequency of Communication with Friends and Family: Patient unable to answer    Frequency of Social Gatherings with Friends and Family: Three times a week    Attends Religious Services: Never    Active Member of Clubs or Organizations: No    Attends Banker Meetings: Never    Marital Status: Living with partner   Family History  Problem Relation Age of Onset   Cancer Father        BONE   Breast cancer Maternal Grandmother    Allergies  Allergen Reactions   Peanut-Containing Drug Products Anaphylaxis    Walnuts, can eat peanuts   Pecan Extract Anaphylaxis   Prior to Admission medications   Medication Sig Start Date End Date Taking? Authorizing Provider  alendronate (FOSAMAX) 70 MG/75ML solution Take 75 mLs (70 mg total) by mouth every 7 (seven) days. Take with a full glass of water on an empty stomach. 05/10/23  Yes Bender, Irving Burton, DO  buprenorphine (BUTRANS) 20 MCG/HR PTWK Place 1 patch onto the skin once a week. 05/12/23  Yes Faith Rogue, DO  Buprenorphine HCl (BELBUCA) 75 MCG FILM Place 1 film (75 mcg) inside cheek daily at 6 (six) AM. 04/14/23 05/15/23 Yes Bender, Irving Burton, DO  cholecalciferol (CHOLECALCIFEROL) 25 MCG tablet Take 1 tablet (1,000 Units total) by mouth daily. 04/13/22  Yes Alwyn Ren, MD  clobetasol  ointment (TEMOVATE) 0.05 % Apply 1 Application topically at bedtime.   Yes [provider]  famotidine (PEPCID) 10 MG tablet Take 10 mg by mouth 2 (two) times daily.   Yes [provider]  feeding supplement (ENSURE ENLIVE / ENSURE PLUS) LIQD Take 237 mLs by mouth 3 (three) times daily between meals. 04/12/22  Yes Alwyn Ren, MD  levothyroxine (SYNTHROID) 50 MCG tablet Take 1 tablet (50 mcg total) by mouth daily at 6 (six) AM. 11/24/22  Yes Nooruddin, Jason Fila, MD  lipase/protease/amylase (CREON) 12000-38000 units CPEP capsule Take 2 capsules (24,000 Units total) by mouth 3 (three) times daily with meals. Patient taking differently: Take 24,000 Units by mouth 3 (three) times daily with meals. Take 1 capsule with snacks 01/27/23  Yes Monna Fam, MD  metoprolol tartrate (LOPRESSOR) 25 MG tablet Take 1/2 tablet (12.5 mg total) by mouth 2 (two) times  daily. 11/24/22 07/07/23 Yes Nooruddin, Jason Fila, MD  Multiple Vitamin (MULITIVITAMIN WITH MINERALS) TABS Take 1 tablet by mouth daily.   Yes [provider]  prochlorperazine (COMPAZINE) 5 MG tablet Take 1 tablet (5 mg total) by mouth every 6 (six) hours as needed for nausea or vomiting. 04/12/23  Yes Bender, Irving Burton, DO  pyridOXINE (B-6) 100 MG tablet Take 1 tablet (100 mg total) by mouth daily. 04/13/22  Yes Alwyn Ren, MD  vitamin A 3 MG (10000 UNITS) capsule Take 1 capsule (10,000 Units total) by mouth every other day. 04/13/22  Yes Alwyn Ren, MD  vitamin B-12 (CYANOCOBALAMIN) 100 MCG tablet Take 100 mcg by mouth daily.   Yes [provider]  Ranitidine HCl (ZANTAC PO) Take by mouth.    06/06/11  [provider]   DG Knee Right Port Result Date: 05/14/2023 CLINICAL DATA:  History of proximal right femur fracture. EXAM: PORTABLE RIGHT KNEE - 1-2 VIEW COMPARISON:  None Available. FINDINGS: No significant joint effusion. Osteopenia. No fracture or dislocation. No significant arthropathy. Soft  tissues appear normal. IMPRESSION: 1. No acute findings. 2. Osteopenia. Electronically Signed   By: Signa Kell M.D.   On: 05/14/2023 10:05   DG Hip Unilat W or Wo Pelvis 2-3 Views Right Result Date: 05/13/2023 CLINICAL DATA:  Status post fall. EXAM: DG HIP (WITH OR WITHOUT PELVIS) 2-3V RIGHT COMPARISON:  September 26, 2022 FINDINGS: There is an acute, comminuted fracture deformity extending through the inter trochanteric region of the proximal right femur. There is no evidence of dislocation. Mild degenerative changes are seen in the form of joint space narrowing and acetabular sclerosis. IMPRESSION: Acute, comminuted fracture of the proximal right femur. Electronically Signed   By: Aram Candela M.D.   On: 05/13/2023 20:26   DG Chest 1 View Result Date: 05/13/2023 CLINICAL DATA:  Status post fall. EXAM: CHEST  1 VIEW COMPARISON:  Jul 14, 2022 FINDINGS: The heart size and mediastinal contours are within normal limits. The lungs are hyperinflated. Mild, diffuse, chronic appearing increased interstitial lung markings are noted. There is no evidence of focal consolidation, pleural effusion or pneumothorax. Radiopaque surgical clips are seen within the upper abdomen. A chronic eighth right rib fracture is noted. Multilevel degenerative changes are noted throughout the thoracic spine. IMPRESSION: Chronic appearing increased interstitial lung markings without evidence of acute or active cardiopulmonary disease. Electronically Signed   By: Aram Candela M.D.   On: 05/13/2023 20:23    Positive ROS: All other systems have been reviewed and were otherwise negative with the exception of those mentioned in the HPI and as above.  Physical Exam: General: Alert, no acute distress Cardiovascular: No pedal edema Respiratory: No cyanosis, no use of accessory musculature GI: No organomegaly, abdomen is soft and non-tender Skin: No lesions in the area of chief complaint Neurologic: Sensation intact  distally Psychiatric: Patient is competent for consent with normal mood and affect Lymphatic: No axillary or cervical lymphadenopathy  MUSCULOSKELETAL: Examination right hip reveals no skin wounds or lesions.  She is shortened and externally rotated.  She has pain with attempted logrolling of the hip.  Distally, she has palpable pedal pulses.  Positive motor function dorsiflexion, plantarflexion, and great toe extension, with strength limited by hip pain.  Subjective sensory change consistent with neuropathy.  Assessment: Comminuted right intertrochanteric femur fracture. Osteoporosis on Fosamax. Anemia of chronic disease. Thrombocytopenia.  Plan: I discussed the findings with the patient.  She has an unstable, displaced right hip fracture which requires  surgical stabilization for pain control and immediate mobilization out of bed.  We discussed the risk, benefits, and alternatives to intramedullary fixation right femur.  Please see statement of risk.  Continue NPO.  Plan for surgery today.  Due to thrombocytopenia, we will have to hold postop chemical DVT prophylaxis.  Also noted to have chronic gastric ulcer, so we will need to avoid aspirin.  Will need to hold Fosamax postoperatively for 6 to 12 months as well.  The risks, benefits, and alternatives were discussed with the patient. There are risks associated with the surgery including, but not limited to, problems with anesthesia (death), infection, differences in leg length/angulation/rotation, fracture of bones, loosening or failure of implants, malunion, nonunion, hematoma (blood accumulation) which may require surgical drainage, blood clots, pulmonary embolism, nerve injury (foot drop), and blood vessel injury. The patient understands these risks and elects to proceed.   Jonette Pesa, MD [phone]    05/14/2023 10:31 AM

## 2023-05-14 NOTE — Progress Notes (Addendum)
 [facility] PROGRESS NOTE    Progress Note  Carolyn [name]  ZOX:[id number] DOB: 10-27-1955 DOA: 05/13/2023 PCP: Faith Rogue, DO     Brief Narrative:   Carolyn [name] is an 68 y.o. female past medical history significant for alcoholic cirrhosis, chronic pancreatitis with chronic pancreatic insufficiency who comes in after mechanical fall in the ED found to have a right comminuted femoral fracture, orthopedic surgery was consulted   Assessment/Plan:   Right femoral fracture [facility]) Orthopedic surgery was consulted recommended n.p.o. Surgical reduction on 05/14/2023. Continue narcotics for pain control started on a bowel regimen. PT OT evaluation postsurgical procedure. Transfer to [facility], according to the patient she does not have seizures there is no documented episode of seizures. DVT prophylaxis on narcotics per orthopedic surgery. Will be cautious with DVT prophylaxis due to her history of cirrhosis with new thrombocytopenia.  Normocytic anemia: With baseline hemoglobin around 10, on admission after hydration 7.8. Will transfuse 1 unit of packed red blood cells. Check CBC post transfusional.  Thrombocytopenia: Likely due to history of alcohol. Continue to monitor closely. Will be careful with anticoagulation, even DVT prophylaxis. Her last platelet count was 200-300 of May of last year  Alcoholic cirrhosis of liver without ascites (HCC) Appears to be compensated, at home she is not on lactulose or Aldactone   Hypothyroidism: Continue Synthroid.  Secondary pancreatic insufficiency: Resume Creon.  Osteoporosis Follow-up with PCP as an outpatient.  Severe protein-calorie malnutrition (HCC) Noted  Sacral decubitus ulcer present on admission stage I RN Pressure Injury Documentation: Pressure Injury 01/07/22 Coccyx Mid Stage 1 -  Intact skin with non-blanchable redness of a localized area usually over a bony prominence. (Active)  01/07/22 1848  Location: Coccyx   Location Orientation: Mid  Staging: Stage 1 -  Intact skin with non-blanchable redness of a localized area usually over a bony prominence.  Wound Description (Comments):   Present on Admission: Yes     Pressure Injury 03/15/22 Knee Anterior;Right Unstageable - Full thickness tissue loss in which the base of the injury is covered by slough (yellow, tan, gray, green or brown) and/or eschar (tan, brown or black) in the wound bed. blister on knee now open  (Active)  03/15/22   Location: Knee  Location Orientation: Anterior;Right  Staging: Unstageable - Full thickness tissue loss in which the base of the injury is covered by slough (yellow, tan, gray, green or brown) and/or eschar (tan, brown or black) in the wound bed.  Wound Description (Comments): blister on knee now open with black center  Present on Admission: Yes     Pressure Injury 04/21/22 Ankle Right;Medial Stage 3 -  Full thickness tissue loss. Subcutaneous fat may be visible but bone, tendon or muscle are NOT exposed. (Active)  04/21/22 0840  Location: Ankle  Location Orientation: Right;Medial  Staging: Stage 3 -  Full thickness tissue loss. Subcutaneous fat may be visible but bone, tendon or muscle are NOT exposed.  Wound Description (Comments):   Present on Admission: Yes    DVT prophylaxis: lovenox Family Communication:None Status is: Inpatient Remains inpatient appropriate because: Right hip fracture    Code Status:     Code Status Orders  (From admission, onward)           Start     Ordered   05/13/23 2319  Full code  Continuous       Question:  By:  Answer:  Consent: discussion documented in EHR   05/13/23 2319  Code Status History     Date Active Date Inactive Code Status Order ID Comments User Context   05/05/2022 1812 05/06/2022 1818 Full Code [id number]  Eldred Manges, MD Inpatient   04/22/2022 1035 04/23/2022 2136 DNR [id number]  Joseph Art, DO Inpatient   04/21/2022 1713 04/22/2022 1035 Full  Code [id number]  Eldred Manges, MD Inpatient   04/21/2022 0112 04/21/2022 1713 DNR [id number]  Charlsie Quest, MD ED   03/23/2022 1423 04/12/2022 2104 DNR [id number]  Edsel Petrin, DO Inpatient   03/14/2022 1802 03/23/2022 1423 Full Code [id number]  Josephine Igo, DO ED   01/07/2022 1757 01/25/2022 2000 Full Code [id number]  Teddy Spike, DO Inpatient   06/12/2020 1654 06/14/2020 2001 Full Code [id number]  Tyrone Nine, MD Inpatient   06/12/2020 0518 06/12/2020 1654 Full Code [id number]  Horton, Mayer Masker, MD ED         IV Access:   Peripheral IV   Procedures and diagnostic studies:   DG Hip Unilat W or Wo Pelvis 2-3 Views Right Result Date: 05/13/2023 CLINICAL DATA:  Status post fall. EXAM: DG HIP (WITH OR WITHOUT PELVIS) 2-3V RIGHT COMPARISON:  September 26, 2022 FINDINGS: There is an acute, comminuted fracture deformity extending through the inter trochanteric region of the proximal right femur. There is no evidence of dislocation. Mild degenerative changes are seen in the form of joint space narrowing and acetabular sclerosis. IMPRESSION: Acute, comminuted fracture of the proximal right femur. Electronically Signed   By: Aram Candela M.D.   On: 05/13/2023 20:26   DG Chest 1 View Result Date: 05/13/2023 CLINICAL DATA:  Status post fall. EXAM: CHEST  1 VIEW COMPARISON:  Jul 14, 2022 FINDINGS: The heart size and mediastinal contours are within normal limits. The lungs are hyperinflated. Mild, diffuse, chronic appearing increased interstitial lung markings are noted. There is no evidence of focal consolidation, pleural effusion or pneumothorax. Radiopaque surgical clips are seen within the upper abdomen. A chronic eighth right rib fracture is noted. Multilevel degenerative changes are noted throughout the thoracic spine. IMPRESSION: Chronic appearing increased interstitial lung markings without evidence of acute or active cardiopulmonary disease. Electronically Signed   By: Aram Candela M.D.    On: 05/13/2023 20:23     Medical Consultants:   None.   Subjective:    [name] relates her pain is not controlled  Objective:    Vitals:   05/14/23 0228 05/14/23 0400 05/14/23 0500 05/14/23 0615  BP:  114/67 108/65   Pulse:  89 87   Resp:  18 16   Temp: 98.2 F (36.8 C)   98.1 F (36.7 C)  TempSrc: Oral   Oral  SpO2:  100% 99%   Weight:      Height:       SpO2: 99 %  No intake or output data in the 24 hours ending 05/14/23 0626 Filed Weights   05/13/23 1859  Weight: 45.8 kg    Exam: General exam: In no acute distress. Respiratory system: Good air movement and clear to auscultation. Cardiovascular system: S1 & S2 heard, RRR. No JVD. Gastrointestinal system: Abdomen is nondistended, soft and nontender.  Extremities: No pedal edema. Skin: No rashes, lesions or ulcers Psychiatry: Judgement and insight appear normal. Mood & affect appropriate.    Data Reviewed:    Labs: Basic Metabolic Panel: Recent Labs  Lab 05/13/23 2102  NA 138  K 4.2  CL 106  CO2 22  GLUCOSE 142*  BUN  15  CREATININE 0.87  CALCIUM 8.3*   GFR Estimated Creatinine Clearance: 46.6 mL/min (by C-G formula based on SCr of 0.87 mg/dL). Liver Function Tests: No results for input(s): "AST", "ALT", "ALKPHOS", "BILITOT", "PROT", "ALBUMIN" in the last 168 hours. No results for input(s): "LIPASE", "AMYLASE" in the last 168 hours. No results for input(s): "AMMONIA" in the last 168 hours. Coagulation profile No results for input(s): "INR", "PROTIME" in the last 168 hours. COVID-19 Labs  No results for input(s): "DDIMER", "FERRITIN", "LDH", "CRP" in the last 72 hours.  Lab Results  Component Value Date   SARSCOV2NAA NEGATIVE 04/21/2022   SARSCOV2NAA NEGATIVE 06/12/2020   SARSCOV2NAA NEGATIVE 01/31/2020    CBC: Recent Labs  Lab 05/13/23 2102 05/14/23 0420  WBC 5.5 5.4  NEUTROABS 3.9  --   HGB 9.2* 7.8*  HCT 27.4* 23.0*  MCV 98.9 98.7  PLT 66* 61*   Cardiac  Enzymes: No results for input(s): "CKTOTAL", "CKMB", "CKMBINDEX", "TROPONINI" in the last 168 hours. BNP (last 3 results) No results for input(s): "PROBNP" in the last 8760 hours. CBG: Recent Labs  Lab 05/13/23 2349  GLUCAP 248*   D-Dimer: No results for input(s): "DDIMER" in the last 72 hours. Hgb A1c: Recent Labs    05/14/23 0420  HGBA1C 5.3   Lipid Profile: No results for input(s): "CHOL", "HDL", "LDLCALC", "TRIG", "CHOLHDL", "LDLDIRECT" in the last 72 hours. Thyroid function studies: No results for input(s): "TSH", "T4TOTAL", "T3FREE", "THYROIDAB" in the last 72 hours.  Invalid input(s): "FREET3" Anemia work up: No results for input(s): "VITAMINB12", "FOLATE", "FERRITIN", "TIBC", "IRON", "RETICCTPCT" in the last 72 hours. Sepsis Labs: Recent Labs  Lab 05/13/23 2102 05/14/23 0420  WBC 5.5 5.4   Microbiology No results found for this or any previous visit (from the past 240 hours).   Medications:    insulin aspart  0-15 Units Subcutaneous TID WC   insulin aspart  0-5 Units Subcutaneous QHS   Continuous Infusions:  lactated ringers 40 mL/hr at 05/13/23 2353      LOS: 1 day   Marinda Elk  [facility]  05/14/2023, 6:26 AM

## 2023-05-15 DIAGNOSIS — D638 Anemia in other chronic diseases classified elsewhere: Secondary | ICD-10-CM | POA: Diagnosis not present

## 2023-05-15 DIAGNOSIS — S72031A Displaced midcervical fracture of right femur, initial encounter for closed fracture: Secondary | ICD-10-CM | POA: Diagnosis not present

## 2023-05-15 DIAGNOSIS — K8689 Other specified diseases of pancreas: Secondary | ICD-10-CM

## 2023-05-15 DIAGNOSIS — I5032 Chronic diastolic (congestive) heart failure: Secondary | ICD-10-CM | POA: Diagnosis not present

## 2023-05-15 DIAGNOSIS — E43 Unspecified severe protein-calorie malnutrition: Secondary | ICD-10-CM

## 2023-05-15 DIAGNOSIS — M8080XP Other osteoporosis with current pathological fracture, unspecified site, subsequent encounter for fracture with malunion: Secondary | ICD-10-CM

## 2023-05-15 DIAGNOSIS — S8291XD Unspecified fracture of right lower leg, subsequent encounter for closed fracture with routine healing: Secondary | ICD-10-CM

## 2023-05-15 DIAGNOSIS — G894 Chronic pain syndrome: Secondary | ICD-10-CM

## 2023-05-15 LAB — BASIC METABOLIC PANEL
Anion gap: 9 (ref 5–15)
BUN: 15 mg/dL (ref 8–23)
CO2: 21 mmol/L — ABNORMAL LOW (ref 22–32)
Calcium: 7.4 mg/dL — ABNORMAL LOW (ref 8.9–10.3)
Chloride: 104 mmol/L (ref 98–111)
Creatinine, Ser: 0.69 mg/dL (ref 0.44–1.00)
GFR, Estimated: >60 mL/min (ref >60)
Glucose, Bld: 210 mg/dL — ABNORMAL HIGH (ref 70–99)
Potassium: 3.5 mmol/L (ref 3.5–5.1)
Sodium: 134 mmol/L — ABNORMAL LOW (ref 135–145)

## 2023-05-15 LAB — CBC
HCT: 20.1 % — ABNORMAL LOW (ref 36.0–46.0)
Hemoglobin: 6.5 g/dL — CL (ref 12.0–15.0)
MCH: 32.2 pg (ref 26.0–34.0)
MCHC: 32.3 g/dL (ref 30.0–36.0)
MCV: 99.5 fL (ref 80.0–100.0)
Platelets: 47 10*3/uL — ABNORMAL LOW (ref 150–400)
RBC: 2.02 MIL/uL — ABNORMAL LOW (ref 3.87–5.11)
RDW: 15.9 % — ABNORMAL HIGH (ref 11.5–15.5)
WBC: 6.7 10*3/uL (ref 4.0–10.5)
nRBC: 0 % (ref 0.0–0.2)

## 2023-05-15 LAB — TYPE AND SCREEN
ABO/RH(D): B POS
Antibody Screen: NEGATIVE
Unit division: 0

## 2023-05-15 LAB — RETICULOCYTES
Immature Retic Fract: 19 % — ABNORMAL HIGH (ref 2.3–15.9)
RBC.: 2.7 MIL/uL — ABNORMAL LOW (ref 3.87–5.11)
Retic Count, Absolute: 110.7 10*3/uL (ref 19.0–186.0)
Retic Ct Pct: 4.1 % — ABNORMAL HIGH (ref 0.4–3.1)

## 2023-05-15 LAB — FERRITIN: Ferritin: 215 ng/mL (ref 11–307)

## 2023-05-15 LAB — BPAM RBC
Blood Product Expiration Date: 202503202359
ISSUE DATE / TIME: 202503010824
Unit Type and Rh: 7300

## 2023-05-15 LAB — HEMOGLOBIN AND HEMATOCRIT, BLOOD
HCT: 26.7 % — ABNORMAL LOW (ref 36.0–46.0)
Hemoglobin: 8.8 g/dL — ABNORMAL LOW (ref 12.0–15.0)

## 2023-05-15 LAB — GLUCOSE, CAPILLARY
Glucose-Capillary: 136 mg/dL — ABNORMAL HIGH (ref 70–99)
Glucose-Capillary: 81 mg/dL (ref 70–99)
Glucose-Capillary: 174 mg/dL — ABNORMAL HIGH (ref 70–99)
Glucose-Capillary: 152 mg/dL — ABNORMAL HIGH (ref 70–99)

## 2023-05-15 LAB — IRON AND TIBC
Iron: 115 ug/dL (ref 28–170)
Saturation Ratios: 69 % — ABNORMAL HIGH (ref 10.4–31.8)
TIBC: 168 ug/dL — ABNORMAL LOW (ref 250–450)
UIBC: 53 ug/dL

## 2023-05-15 LAB — PREPARE RBC (CROSSMATCH)

## 2023-05-15 LAB — FOLATE: Folate: 18.2 ng/mL (ref >5.9)

## 2023-05-15 LAB — VITAMIN B12: Vitamin B-12: 914 pg/mL (ref 180–914)

## 2023-05-15 MED ORDER — LEVOTHYROXINE SODIUM 50 MCG PO TABS
50.0000 ug | ORAL_TABLET | Freq: Every day | ORAL | Status: DC
  Administered 2023-05-16 – 2023-05-17 (×2): 50 ug via ORAL
  Filled 2023-05-15 (×2): qty 1

## 2023-05-15 MED ORDER — PANCRELIPASE (LIP-PROT-AMYL) 12000-38000 UNITS PO CPEP
24000.0000 [IU] | ORAL_CAPSULE | Freq: Three times a day (TID) | ORAL | Status: DC
  Administered 2023-05-15 – 2023-05-17 (×7): 24000 [IU] via ORAL
  Filled 2023-05-15 (×7): qty 2

## 2023-05-15 MED ORDER — SODIUM CHLORIDE 0.9% IV SOLUTION: Freq: Once | INTRAVENOUS | Status: AC

## 2023-05-15 MED ORDER — PANCRELIPASE (LIP-PROT-AMYL) 12000-38000 UNITS PO CPEP: 12000.0000 [IU] | ORAL_CAPSULE | Freq: Three times a day (TID) | ORAL | Status: DC | PRN

## 2023-05-15 MED ORDER — ADULT MULTIVITAMIN W/MINERALS CH
1.0000 | ORAL_TABLET | Freq: Every day | ORAL | Status: DC
  Administered 2023-05-15 – 2023-05-17 (×3): 1 via ORAL
  Filled 2023-05-15 (×3): qty 1

## 2023-05-15 MED ORDER — BUPRENORPHINE 20 MCG/HR TD PTWK: 1.0000 | MEDICATED_PATCH | TRANSDERMAL | Status: DC

## 2023-05-15 MED ORDER — FAMOTIDINE 20 MG PO TABS
10.0000 mg | ORAL_TABLET | Freq: Two times a day (BID) | ORAL | Status: DC
  Administered 2023-05-15 – 2023-05-17 (×5): 10 mg via ORAL
  Filled 2023-05-15 (×5): qty 1

## 2023-05-15 MED ORDER — ENSURE ENLIVE PO LIQD
237.0000 mL | Freq: Three times a day (TID) | ORAL | Status: DC
  Administered 2023-05-15 – 2023-05-17 (×5): 237 mL via ORAL

## 2023-05-15 MED ORDER — BUPRENORPHINE 10 MCG/HR TD PTWK
2.0000 | MEDICATED_PATCH | TRANSDERMAL | Status: DC
  Administered 2023-05-15: 2 via TRANSDERMAL
  Filled 2023-05-15: qty 2

## 2023-05-15 NOTE — Progress Notes (Signed)
 Orthopedics Progress Note  Subjective: Patient feeling better today with min hip pain  Objective:  Vitals:   05/15/23 0158 05/15/23 0459  BP: 94/67 121/68  Pulse: 97 100  Resp: 19 16  Temp: 98.5 F (36.9 C) 98.9 F (37.2 C)  SpO2: 100% 100%    General: Awake and alert  Musculoskeletal: Right hip dressings CDI. No pain with ankle pumps Neurovascularly intact  Lab Results  Component Value Date   WBC 6.7 05/15/2023   HGB 6.5 (LL) 05/15/2023   HCT 20.1 (L) 05/15/2023   MCV 99.5 05/15/2023   PLT 47 (L) 05/15/2023       Component Value Date/Time   NA 134 (L) 05/15/2023 0351   NA 137 09/06/2022 1112   K 3.5 05/15/2023 0351   CL 104 05/15/2023 0351   CO2 21 (L) 05/15/2023 0351   GLUCOSE 210 (H) 05/15/2023 0351   BUN 15 05/15/2023 0351   BUN 21 09/06/2022 1112   CREATININE 0.69 05/15/2023 0351   CREATININE 0.73 03/11/2011 1512   CALCIUM 7.4 (L) 05/15/2023 0351   GFRNONAA >60 05/15/2023 0351   GFRAA >60 09/30/2017 1323    Lab Results  Component Value Date   INR 1.0 06/21/2022   INR 1.3 (H) 03/23/2022   INR 1.4 (H) 01/18/2022    Assessment/Plan: POD #1 s/p Procedure(s): INTRAMEDULLARY (IM) NAIL FEMORAL, RIGHT HIP Stable overnight. Acute blood loss anemia Hgb 6.5, will likely drop more. Recommend consider blood transfusion Mobilization with PT and OT  Almedia Balls. Ranell Patrick, MD 05/15/2023 8:36 AM

## 2023-05-15 NOTE — Progress Notes (Signed)
 PT Cancellation Note  Patient Details Name: Carolyn [name] MRN: [id number] DOB: 31-Jan-1956   Cancelled Treatment:    Reason Eval/Treat Not Completed: Patient not medically ready Patient with Hgb 6.5,  pending unit of PRBCs at this time. PT to continue to follow and check back as schedule will allow    Delice Bison, PT  Acute Rehab Dept [facility]) [phone]  05/15/2023

## 2023-05-15 NOTE — Progress Notes (Signed)
 Date and time results received: 05/15/23 0445  Test: Hgb Critical Value: 6.5  Name of Provider Notified: Chinita Greenland, NP  Orders Received? See new orders

## 2023-05-15 NOTE — Plan of Care (Signed)
  Problem: Elimination: Goal: Will not experience complications related to urinary retention Outcome: Progressing   Problem: Safety: Goal: Ability to remain free from injury will improve Outcome: Progressing   Problem: Education: Goal: Verbalization of understanding the information provided (i.e., activity precautions, restrictions, etc) will improve Outcome: Progressing   Problem: Clinical Measurements: Goal: Postoperative complications will be avoided or minimized Outcome: Progressing   Problem: Pain Management: Goal: Pain level will decrease Outcome: Progressing

## 2023-05-15 NOTE — Plan of Care (Signed)
   Problem: Coping: Goal: Ability to adjust to condition or change in health will improve Outcome: Progressing   Problem: Health Behavior/Discharge Planning: Goal: Ability to manage health-related needs will improve Outcome: Progressing

## 2023-05-15 NOTE — Progress Notes (Signed)
 OT Cancellation Note  Patient Details Name: Carolyn [name] MRN: [id number] DOB: 1955-07-23   Cancelled Treatment:    Reason Eval/Treat Not Completed: Patient not medically ready Patient pending unit of PRBCs at this time. OT to continue to follow and check back as schedule will allow.  Rosalio Loud, MS Acute Rehabilitation Department Office# [phone]  05/15/2023, 7:17 AM

## 2023-05-15 NOTE — Progress Notes (Addendum)
 PROGRESS NOTE  Carolyn [name] ZOX:[id number] DOB: [birth year]   PCP: Faith Rogue, DO  Patient is from: Home.  DOA: 05/13/2023 LOS: 2  Chief complaints No chief complaint on file.    Brief Narrative / Interim history: 68 year old F with PMH of alcoholic cirrhosis, chronic pancreatitis with pancreatic insufficiency, chronic pain syndrome: Hypothyroidism, anemia, thrombocytopenia and osteoporosis presenting with right hip pain after she had mechanical fall and admitted with comminuted right proximal femoral fracture.  Patient underwent intramedullary nailing by Dr. Linna Caprice on 05/14/2022.   Hemoglobin drifted from 9.2-6.5.  Reported EBL about 10 cc.  Patient was on IV fluid.  Transfused 1 unit on 3/2.  Subjective: Seen and examined earlier this morning.  No major events overnight of this morning.  She reports some pain at surgical site.  She rates her pain 7/10 although she does not appear to be in that mild distress.  Denies melena or hematochezia.  Objective: Vitals:   05/15/23 0459 05/15/23 0920 05/15/23 0944 05/15/23 0947  BP: 121/68 100/62 100/66 100/66  Pulse: 100 100 98 89  Resp: 16  18   Temp: 98.9 F (37.2 C) 99.5 F (37.5 C) 99.3 F (37.4 C) 99.3 F (37.4 C)  TempSrc: Oral Oral Oral Oral  SpO2: 100% 100%  100%  Weight:      Height:        Examination:  GENERAL: No apparent distress.  Nontoxic. HEENT: MMM.  Vision and hearing grossly intact.  NECK: Supple.  No apparent JVD.  RESP:  No IWOB.  Fair aeration bilaterally. CVS: Tachycardic to 100s.  2/6 SEM all over. ABD/GI/GU: BS+. Abd soft, NTND.  MSK/EXT:  Moves extremities. No apparent deformity. No edema.  SKIN: Surgical dressing DCI.  No signs of ecchymosis or hematoma. NEURO: Awake, alert and oriented appropriately.  No apparent focal neuro deficit. PSYCH: Calm. Normal affect.   Procedures:  3/1-intramedullary nailing of right proximal femoral fracture by Dr. Linna Caprice  Microbiology  summarized: None  Assessment and plan: Mechanical fall at home Right femoral fracture due to fall and osteoporosis -S/p intramedullary nailing by Dr. Linna Caprice on 3/1. -Pain control per surgery -SCD for VTE prophylaxis given anemia and thrombocytopenia -Check vitamin D -Continue home Fosamax -PT/OT eval    Normocytic anemia/thrombocytopenia: Due to underlying liver cirrhosis?  Hgb dropped from 9.2-6.5.  Unknown baseline.  Reported EBL 10 cc.  No overt bleeding.  She has alcohol liver cirrhosis. Recent Labs    06/21/22 1047 07/14/22 0941 05/13/23 2102 05/14/23 0420 05/14/23 1504 05/15/23 0351  HGB 12.5 11.5* 9.2* 7.8* 8.6* 6.5*  -Transfuse 1 unit.  Goal Hgb > 7.0 -Check anemia panel -SCD for VTE prophylaxis -Recheck in the morning   Alcoholic cirrhosis of liver without ascites/chronic pancreatitis/pancreatic insufficiency -Continue home Creon  Chronic pain-since she is on Butrans patch and Norco at home.   -Continue Norco per orthopedic surgery -Resume home Butrans patch   Hypothyroidism: -Continue Synthroid.   Osteoporosis? -Continue home Fosamax -Outpatient follow-up  Heart murmur: She has 2/6 SEM all over.  TTE in 2023 without significant valvular lesion.  I suspect this is due to anemia and tachycardia.  -Outpatient follow-up.  Severe protein calorie malnutrition: As evidenced by low BMI and significant muscle mass and subcu fat loss Body mass index is 15.35 kg/m. -Continue home Creon -Consult dietitian      DVT prophylaxis:  SCDs Start: 05/14/23 1456 Place TED hose Start: 05/14/23 1456 SCDs Start: 05/13/23 2347  Code Status: Full code Family Communication: None at bedside  Level of care: Med-Surg Status is: Inpatient Remains inpatient appropriate because: Right femoral fracture   Final disposition: SNF Consultants:  Orthopedic surgery  55 minutes with more than 50% spent in reviewing records, counseling patient/family and coordinating  care.   Sch Meds:  Scheduled Meds:  sodium chloride   Intravenous Once   sodium chloride   Intravenous Once   docusate sodium  100 mg Oral BID   famotidine  10 mg Oral BID   feeding supplement  237 mL Oral TID BM   insulin aspart  0-15 Units Subcutaneous TID WC   insulin aspart  0-5 Units Subcutaneous QHS   [START ON 05/16/2023] levothyroxine  50 mcg Oral QAC breakfast   lipase/protease/amylase  24,000 Units Oral TID WC   multivitamin with minerals  1 tablet Oral Daily   polyethylene glycol  17 g Oral BID   senna  1 tablet Oral BID   Continuous Infusions: PRN Meds:.acetaminophen, bisacodyl, HYDROcodone-acetaminophen, HYDROcodone-acetaminophen, lipase/protease/amylase, menthol-cetylpyridinium **OR** phenol, methocarbamol, metoCLOPramide **OR** metoCLOPramide (REGLAN) injection, morphine injection, ondansetron **OR** ondansetron (ZOFRAN) IV, polyethylene glycol  Antimicrobials: Anti-infectives (From admission, onward)    Start     Dose/Rate Route Frequency Ordered Stop   05/14/23 1800  ceFAZolin (ANCEF) IVPB 2g/100 mL premix        2 g 200 mL/hr over 30 Minutes Intravenous Every 6 hours 05/14/23 1455 05/14/23 2341   05/14/23 1014  ceFAZolin (ANCEF) 2-4 GM/100ML-% IVPB       Note to Pharmacy: Myrlene Broker M: cabinet override      05/14/23 1014 05/14/23 1209   05/14/23 0845  ceFAZolin (ANCEF) IVPB 2g/100 mL premix        2 g 200 mL/hr over 30 Minutes Intravenous On call to O.R. 05/14/23 0841 05/14/23 1145   05/14/23 0745  ceFAZolin (ANCEF) IVPB 2g/100 mL premix  Status:  Discontinued        2 g 200 mL/hr over 30 Minutes Intravenous On call to O.R. 05/14/23 1610 05/14/23 0858        I have personally reviewed the following labs and images: CBC: Recent Labs  Lab 05/13/23 2102 05/14/23 0420 05/14/23 1504 05/15/23 0351  WBC 5.5 5.4 6.2 6.7  NEUTROABS 3.9  --   --   --   HGB 9.2* 7.8* 8.6* 6.5*  HCT 27.4* 23.0* 25.8* 20.1*  MCV 98.9 98.7 96.6 99.5  PLT 66* 61* 62* 47*    BMP &GFR Recent Labs  Lab 05/13/23 2102 05/14/23 1504 05/15/23 0351  NA 138 136 134*  K 4.2 4.0 3.5  CL 106 104 104  CO2 22 23 21*  GLUCOSE 142* 166* 210*  BUN 15 18 15   CREATININE 0.87 0.70 0.69  CALCIUM 8.3* 7.6* 7.4*   Estimated Creatinine Clearance: 50.7 mL/min (by C-G formula based on SCr of 0.69 mg/dL). Liver & Pancreas: Recent Labs  Lab 05/14/23 1504  AST 55*  ALT 29  ALKPHOS 84  BILITOT 1.4*  PROT 5.1*  ALBUMIN 2.6*   No results for input(s): "LIPASE", "AMYLASE" in the last 168 hours. No results for input(s): "AMMONIA" in the last 168 hours. Diabetic: Recent Labs    05/14/23 0420  HGBA1C 5.3   Recent Labs  Lab 05/13/23 2349 05/14/23 1647 05/14/23 2134 05/15/23 0742  GLUCAP 248* 161* 185* 136*   Cardiac Enzymes: No results for input(s): "CKTOTAL", "CKMB", "CKMBINDEX", "TROPONINI" in the last 168 hours. No results for input(s): "PROBNP" in the last 8760 hours. Coagulation Profile: No results for input(s): "INR", "PROTIME" in  the last 168 hours. Thyroid Function Tests: No results for input(s): "TSH", "T4TOTAL", "FREET4", "T3FREE", "THYROIDAB" in the last 72 hours. Lipid Profile: No results for input(s): "CHOL", "HDL", "LDLCALC", "TRIG", "CHOLHDL", "LDLDIRECT" in the last 72 hours. Anemia Panel: No results for input(s): "VITAMINB12", "FOLATE", "FERRITIN", "TIBC", "IRON", "RETICCTPCT" in the last 72 hours. Urine analysis:    Component Value Date/Time   COLORURINE YELLOW 09/30/2017 1449   APPEARANCEUR CLEAR 09/30/2017 1449   LABSPEC <1.005 (L) 09/30/2017 1449   PHURINE 6.5 09/30/2017 1449   GLUCOSEU NEGATIVE 09/30/2017 1449   HGBUR NEGATIVE 09/30/2017 1449   BILIRUBINUR NEGATIVE 09/30/2017 1449   KETONESUR NEGATIVE 09/30/2017 1449   PROTEINUR NEGATIVE 09/30/2017 1449   UROBILINOGEN 4.0 (H) 06/07/2011 0007   NITRITE NEGATIVE 09/30/2017 1449   LEUKOCYTESUR TRACE (A) 09/30/2017 1449   Sepsis Labs: Invalid input(s): "PROCALCITONIN",  "LACTICIDVEN"  Microbiology: No results found for this or any previous visit (from the past 240 hours).  Radiology Studies: DG HIP UNILAT W OR W/O PELVIS 2-3 VIEWS RIGHT Result Date: 05/14/2023 CLINICAL DATA:  Postop. EXAM: DG HIP (WITH OR WITHOUT PELVIS) 2-3V RIGHT COMPARISON:  Preoperative imaging FINDINGS: Femoral intramedullary nail with trans trochanteric and distal locking screw fixation traversing proximal femur fracture. Improved fracture alignment from preoperative imaging. Recent postsurgical change includes air and edema in the soft tissues with lateral skin staples in place. IMPRESSION: ORIF proximal femur fracture without immediate postoperative complication. Electronically Signed   By: Narda Rutherford M.D.   On: 05/14/2023 13:55   DG HIP UNILAT WITH PELVIS 1V RIGHT Result Date: 05/14/2023 CLINICAL DATA:  Elective surgery. EXAM: DG HIP (WITH OR WITHOUT PELVIS) 1V RIGHT COMPARISON:  Preoperative imaging FINDINGS: Five fluoroscopic spot views of the right hip submitted from the operating room. Femoral intramedullary nail with trans trochanteric and distal locking screw fixation fixate proximal femur fracture. Fluoroscopy time 40 seconds. Dose 2.67 mGy IMPRESSION: Procedural fluoroscopy during right proximal femur fracture fixation. Electronically Signed   By: Narda Rutherford M.D.   On: 05/14/2023 13:54   DG C-Arm 1-60 Min-No Report Result Date: 05/14/2023 Fluoroscopy was utilized by the requesting physician.  No radiographic interpretation.      Korban Shearer T. Terrika Zuver [facility]  If 7PM-7AM, please contact night-coverage [email or web address] 05/15/2023, 11:05 AM

## 2023-05-16 ENCOUNTER — Other Ambulatory Visit (HOSPITAL_BASED_OUTPATIENT_CLINIC_OR_DEPARTMENT_OTHER): Payer: Self-pay

## 2023-05-16 DIAGNOSIS — S72031A Displaced midcervical fracture of right femur, initial encounter for closed fracture: Secondary | ICD-10-CM | POA: Diagnosis not present

## 2023-05-16 LAB — BPAM RBC
Blood Product Expiration Date: 202503052359
ISSUE DATE / TIME: 202503020925
Unit Type and Rh: 1700

## 2023-05-16 LAB — RENAL FUNCTION PANEL
Albumin: 2.4 g/dL — ABNORMAL LOW (ref 3.5–5.0)
Anion gap: 7 (ref 5–15)
BUN: 13 mg/dL (ref 8–23)
CO2: 26 mmol/L (ref 22–32)
Calcium: 7.7 mg/dL — ABNORMAL LOW (ref 8.9–10.3)
Chloride: 103 mmol/L (ref 98–111)
Creatinine, Ser: 0.58 mg/dL (ref 0.44–1.00)
GFR, Estimated: >60 mL/min (ref >60)
Glucose, Bld: 126 mg/dL — ABNORMAL HIGH (ref 70–99)
Phosphorus: 1.9 mg/dL — ABNORMAL LOW (ref 2.5–4.6)
Potassium: 3.7 mmol/L (ref 3.5–5.1)
Sodium: 136 mmol/L (ref 135–145)

## 2023-05-16 LAB — TYPE AND SCREEN
ABO/RH(D): B POS
Antibody Screen: NEGATIVE
Unit division: 0

## 2023-05-16 LAB — CBC
HCT: 22.7 % — ABNORMAL LOW (ref 36.0–46.0)
Hemoglobin: 7.5 g/dL — ABNORMAL LOW (ref 12.0–15.0)
MCH: 31.1 pg (ref 26.0–34.0)
MCHC: 33 g/dL (ref 30.0–36.0)
MCV: 94.2 fL (ref 80.0–100.0)
Platelets: 54 10*3/uL — ABNORMAL LOW (ref 150–400)
RBC: 2.41 MIL/uL — ABNORMAL LOW (ref 3.87–5.11)
RDW: 16.1 % — ABNORMAL HIGH (ref 11.5–15.5)
WBC: 5.6 10*3/uL (ref 4.0–10.5)
nRBC: 0 % (ref 0.0–0.2)

## 2023-05-16 LAB — GLUCOSE, CAPILLARY
Glucose-Capillary: 161 mg/dL — ABNORMAL HIGH (ref 70–99)
Glucose-Capillary: 73 mg/dL (ref 70–99)
Glucose-Capillary: 206 mg/dL — ABNORMAL HIGH (ref 70–99)
Glucose-Capillary: 98 mg/dL (ref 70–99)

## 2023-05-16 LAB — MAGNESIUM: Magnesium: 2 mg/dL (ref 1.7–2.4)

## 2023-05-16 MED ORDER — K PHOS MONO-SOD PHOS DI & MONO 155-852-130 MG PO TABS
500.0000 mg | ORAL_TABLET | Freq: Two times a day (BID) | ORAL | Status: DC
  Administered 2023-05-16 – 2023-05-17 (×3): 500 mg via ORAL
  Filled 2023-05-16 (×4): qty 2

## 2023-05-16 MED ORDER — BELBUCA 75 MCG BU FILM
75.0000 ug | ORAL_FILM | Freq: Every day | BUCCAL | 0 refills | Status: DC
  Filled 2023-05-16: qty 30, 30d supply, fill #0

## 2023-05-16 MED ORDER — FLEET ENEMA RE ENEM
1.0000 | ENEMA | Freq: Once | RECTAL | Status: AC
  Administered 2023-05-16: 1 via RECTAL
  Filled 2023-05-16: qty 1

## 2023-05-16 NOTE — Anesthesia Postprocedure Evaluation (Signed)
 Anesthesia Post Note  Patient: Carolyn [name]  Procedure(s) Performed: INTRAMEDULLARY (IM) NAIL FEMORAL, RIGHT HIP (Right: Hip)     Patient location during evaluation: PACU Anesthesia Type: General Level of consciousness: awake and alert Pain management: pain level controlled Vital Signs Assessment: post-procedure vital signs reviewed and stable Respiratory status: spontaneous breathing, nonlabored ventilation, respiratory function stable and patient connected to nasal cannula oxygen Cardiovascular status: blood pressure returned to baseline and stable Postop Assessment: no apparent nausea or vomiting Anesthetic complications: no   No notable events documented.  Last Vitals:  Vitals:   05/15/23 2151 05/16/23 0554  BP: 127/89 127/80  Pulse: 98 97  Resp: 18 18  Temp: (!) 36.3 C 36.9 C  SpO2: 97% 98%    Last Pain:  Vitals:   05/16/23 0849  TempSrc:   PainSc: 7                  [name] P Haniyah Maciolek

## 2023-05-16 NOTE — Evaluation (Signed)
 Physical Therapy Evaluation Patient Details Name: Carolyn [name] MRN: [id number] DOB: 1955-09-16 Today's Date: 05/16/2023  History of Present Illness  Patient is a 68 year old female who presented after a fall on 2/28. Patient underwent IM nail on 3/1. Post op noted to have low hgb with one unit of PRBCs provided.  PMH: alcoholic cirrhosis, chronic pain syndrome, anemia, osteoporosis, hypothyroidism,Closed de-rotation and L tibial nail distal interlock screw placement.  Clinical Impression  Pt admitted with above diagnosis.  Pt independent at her baseline, amb ~ 56' with RW and min to CGA. Recommend HHPT   Pt currently with functional limitations due to the deficits listed below (see PT Problem List). Pt will benefit from acute skilled PT to increase their independence and safety with mobility to allow discharge.           If plan is discharge home, recommend the following: A little help with walking and/or transfers;A little help with bathing/dressing/bathroom;Assistance with cooking/housework;Help with stairs or ramp for entrance   Can travel by private vehicle        Equipment Recommendations None recommended by PT  Recommendations for Other Services       Functional Status Assessment Patient has had a recent decline in their functional status and demonstrates the ability to make significant improvements in function in a reasonable and predictable amount of time.     Precautions / Restrictions Precautions Precautions: Fall Recall of Precautions/Restrictions: Intact Restrictions Weight Bearing Restrictions Per Provider Order: No RLE Weight Bearing Per Provider Order: Weight bearing as tolerated      Mobility  Bed Mobility               General bed mobility comments:  (in recliner on arrival)    Transfers Overall transfer level: Needs assistance Equipment used: Rolling walker (2 wheels) Transfers: Sit to/from Stand Sit to Stand: Min assist           General  transfer comment: cues for hand placement and LE position    Ambulation/Gait Ambulation/Gait assistance: Contact guard assist, Min assist Gait Distance (Feet): 60 Feet Assistive device: Rolling walker (2 wheels) Gait Pattern/deviations: Step-to pattern, Decreased stance time - right, Narrow base of support, Trunk flexed       General Gait Details: cues for sequence and RW position from self, intermittent assist to steady. cues for safety and technique when  turning  Stairs            Wheelchair Mobility     Tilt Bed    Modified Rankin (Stroke Patients Only)       Balance   Sitting-balance support: Feet unsupported, No upper extremity supported Sitting balance-Leahy Scale: Good     Standing balance support: Reliant on assistive device for balance, Bilateral upper extremity supported Standing balance-Leahy Scale: Poor Standing balance comment: needed BUE support to maintain balance.                             Pertinent Vitals/Pain Pain Assessment Pain Assessment: Faces Faces Pain Scale: Hurts even more Pain Location: RLE with movement Pain Descriptors / Indicators: Grimacing, Discomfort, Guarding, Constant Pain Intervention(s): Limited activity within patient's tolerance, Monitored during session, Premedicated before session    Home Living Family/patient expects to be discharged to:: Private residence Living Arrangements: Spouse/significant other Available Help at Discharge: Family;Available 24 hours/day Type of Home: House Home Access: Ramped entrance       Home Layout: One level Home Equipment:  Rolling Walker (2 wheels);Cane - single point;Tub bench;Grab bars - tub/shower Additional Comments: can borrow w/c if needed.    Prior Function Prior Level of Function : Independent/Modified Independent             Mobility Comments: amb I'ly ADLs Comments: I for ADLs per patient report. significant other helps IADLs if needed.      Extremity/Trunk Assessment   Upper Extremity Assessment Upper Extremity Assessment: Defer to OT evaluation;Overall WFL for tasks assessed    Lower Extremity Assessment Lower Extremity Assessment: RLE deficits/detail RLE Deficits / Details: ankle grossly WFL, hip/ knee grossly 3/5 limited by acnticipate post op changes    Cervical / Trunk Assessment Cervical / Trunk Assessment: Kyphotic  Communication   Communication Communication: No apparent difficulties    Cognition Arousal: Alert Behavior During Therapy: WFL for tasks assessed/performed   PT - Cognitive impairments: No apparent impairments                         Following commands: Intact       Cueing Cueing Techniques: Verbal cues     General Comments      Exercises     Assessment/Plan    PT Assessment Patient needs continued PT services  PT Problem List Decreased strength;Decreased range of motion;Decreased activity tolerance;Decreased balance;Pain;Decreased mobility       PT Treatment Interventions DME instruction;Gait training;Functional mobility training;Therapeutic activities;Therapeutic exercise    PT Goals (Current goals can be found in the Care Plan section)  Acute Rehab PT Goals PT Goal Formulation: With patient Time For Goal Achievement: 05/23/23 Potential to Achieve Goals: Good    Frequency Min 1X/week     Co-evaluation               AM-PAC PT "6 Clicks" Mobility  Outcome Measure Help needed turning from your back to your side while in a flat bed without using bedrails?: A Little Help needed moving from lying on your back to sitting on the side of a flat bed without using bedrails?: A Little Help needed moving to and from a bed to a chair (including a wheelchair)?: A Little Help needed standing up from a chair using your arms (e.g., wheelchair or bedside chair)?: A Little Help needed to walk in hospital room?: A Little Help needed climbing 3-5 steps with a railing? :  A Little 6 Click Score: 18    End of Session Equipment Utilized During Treatment: Gait belt Activity Tolerance: Patient tolerated treatment well Patient left: with call bell/phone within reach;in chair;with chair alarm set   PT Visit Diagnosis: Other abnormalities of gait and mobility (R26.89)    Time: 1610-9604 PT Time Calculation (min) (ACUTE ONLY): 18 min   Charges:   PT Evaluation $PT Eval Low Complexity: 1 Low   PT General Charges $$ ACUTE PT VISIT: 1 Visit         Liane Tribbey, PT  Acute Rehab Dept [facility]) [phone]  05/16/2023   [facility] 05/16/2023, 11:40 AM

## 2023-05-16 NOTE — Plan of Care (Signed)
 Problem: Coping: Goal: Ability to adjust to condition or change in health will improve Outcome: Progressing   Problem: Clinical Measurements: Goal: Ability to maintain clinical measurements within normal limits will improve Outcome: Progressing   Problem: Activity: Goal: Risk for activity intolerance will decrease Outcome: Progressing   Problem: Pain Managment: Goal: General experience of comfort will improve and/or be controlled Outcome: Progressing   Haydee Salter, RN 05/16/23 7:42 PM

## 2023-05-16 NOTE — Evaluation (Signed)
 Occupational Therapy Evaluation Patient Details Name: Carolyn [name] MRN: [id number] DOB: 02/03/1956 Today's Date: 05/16/2023   History of Present Illness   Patient is a 68 year old female who presented after a fall on 2/28. Patient underwent IM nail on 3/1. Post op noted to have low hgb with one unit of PRBCs provided.  PMH: alcoholic cirrhosis, chronic pain syndrome, anemia, osteoporosis, hypothyroidism,Closed de-rotation and L tibial nail distal interlock screw placement.     Clinical Impressions Patient is a 68 year old female who was admitted for above. Patient was living at home with independence in [place] with spouse. Currently, patient is mod A for transfers with RW with pain impacting continuation. Patient was noted to have decreased functional activity tolerance, decreased endurance, decreased standing balance, decreased safety awareness, and decreased knowledge of AD/AE impacting participation in ADLs. Patient will benefit from continued inpatient follow up therapy, <3 hours/day.      If plan is discharge home, recommend the following:   A lot of help with bathing/dressing/bathroom;Assistance with cooking/housework;Direct supervision/assist for medications management;Assist for transportation;Direct supervision/assist for financial management;Help with stairs or ramp for entrance;A lot of help with walking and/or transfers     Functional Status Assessment   Patient has had a recent decline in their functional status and demonstrates the ability to make significant improvements in function in a reasonable and predictable amount of time.     Equipment Recommendations   None recommended by OT      Precautions/Restrictions   Precautions Precautions: Fall Recall of Precautions/Restrictions: Intact Restrictions Weight Bearing Restrictions Per Provider Order: Yes RLE Weight Bearing Per Provider Order: Weight bearing as tolerated     Mobility Bed Mobility Overal bed  mobility: Needs Assistance Bed Mobility: Supine to Sit     Supine to sit: HOB elevated, Min assist     General bed mobility comments: wih gait belt to advance RLE to EOB       Balance Overall balance assessment: Needs assistance Sitting-balance support: Feet supported Sitting balance-Leahy Scale: Good     Standing balance support: Reliant on assistive device for balance, Bilateral upper extremity supported Standing balance-Leahy Scale: Poor Standing balance comment: needed BUE support to maintain balance.       ADL either performed or assessed with clinical judgement   ADL Overall ADL's : Needs assistance/impaired Eating/Feeding: Modified independent;Sitting   Grooming: Sitting;Wash/dry face;Wash/dry hands;Set up   Upper Body Bathing: Set up;Sitting   Lower Body Bathing: Total assistance;Sitting/lateral leans   Upper Body Dressing : Set up;Sitting   Lower Body Dressing: Total assistance;Sitting/lateral leans   Toilet Transfer: Moderate assistance;Stand-pivot;Rolling walker (2 wheels) Toilet Transfer Details (indicate cue type and reason): to [facility] and then to recliner in room Toileting- Clothing Manipulation and Hygiene: Sit to/from stand;Maximal assistance               Vision Baseline Vision/History: 1 Wears glasses              Pertinent Vitals/Pain Pain Assessment Pain Assessment: Faces Faces Pain Scale: Hurts whole lot Pain Location: RLE with movement Pain Descriptors / Indicators: Grimacing, Discomfort, Guarding, Constant Pain Intervention(s): Limited activity within patient's tolerance, Monitored during session, Premedicated before session, Repositioned     Extremity/Trunk Assessment Upper Extremity Assessment Upper Extremity Assessment: Overall WFL for tasks assessed   Lower Extremity Assessment Lower Extremity Assessment: Defer to PT evaluation   Cervical / Trunk Assessment Cervical / Trunk Assessment: Kyphotic   Communication      Cognition Arousal: Alert Behavior  During Therapy: WFL for tasks assessed/performed Cognition: No apparent impairments             OT - Cognition Comments: plesant and cooperative                 Following commands: Intact                  Home Living Family/patient expects to be discharged to:: Private residence Living Arrangements: Spouse/significant other Available Help at Discharge: Family;Available 24 hours/day Type of Home: House Home Access: Ramped entrance     Home Layout: One level     Bathroom Shower/Tub: Tub/shower unit         Home Equipment: Agricultural consultant (2 wheels);Cane - single point;Tub bench;Grab bars - tub/shower   Additional Comments: can borrow w/c if needed.      Prior Functioning/Environment Prior Level of Function : Independent/Modified Independent               ADLs Comments: I for ADLs per patient report. significant other helps IADLs if needed.    OT Problem List: Impaired balance (sitting and/or standing);Decreased knowledge of precautions;Decreased activity tolerance;Decreased knowledge of use of DME or AE   OT Treatment/Interventions: Self-care/ADL training;DME and/or AE instruction;Balance training;Therapeutic activities;Energy conservation;Patient/family education      OT Goals(Current goals can be found in the care plan section)   Acute Rehab OT Goals Patient Stated Goal: to get home with husband OT Goal Formulation: With patient Time For Goal Achievement: 05/30/23 Potential to Achieve Goals: Fair   OT Frequency:  Min 2X/week       AM-PAC OT "6 Clicks" Daily Activity     Outcome Measure Help from another person eating meals?: None Help from another person taking care of personal grooming?: A Little Help from another person toileting, which includes using toliet, bedpan, or urinal?: A Lot Help from another person bathing (including washing, rinsing, drying)?: A Lot Help from another person to put on  and taking off regular upper body clothing?: A Little Help from another person to put on and taking off regular lower body clothing?: A Lot 6 Click Score: 16   End of Session Equipment Utilized During Treatment: Gait belt;Rolling walker (2 wheels);Other (comment) [facility]) Nurse Communication: Patient requests pain meds  Activity Tolerance: Patient tolerated treatment well;Patient limited by pain Patient left: in chair;with call bell/phone within reach;with chair alarm set  OT Visit Diagnosis: Unsteadiness on feet (R26.81);Other abnormalities of gait and mobility (R26.89);Pain;Muscle weakness (generalized) (M62.81)                Time: 1610-9604 OT Time Calculation (min): 26 min Charges:  OT General Charges $OT Visit: 1 Visit OT Evaluation $OT Eval Moderate Complexity: 1 Mod OT Treatments $Self Care/Home Management : 8-22 mins  Rosalio Loud, MS Acute Rehabilitation Department Office# [phone]   Selinda Flavin 05/16/2023, 9:47 AM

## 2023-05-16 NOTE — Progress Notes (Signed)
 PROGRESS NOTE Carolyn [name]  NWG:[id number] DOB: 1955/08/21 DOA: 05/13/2023 PCP: Faith Rogue, DO  Brief Narrative/Hospital Course: 68 year old F with PMH of alcoholic cirrhosis, chronic pancreatitis with pancreatic insufficiency, chronic pain syndrome: Hypothyroidism, anemia, thrombocytopenia and osteoporosis presenting with right hip pain after she had mechanical fall and admitted with comminuted right proximal femoral fracture and underwent intramedullary nailing by Dr. Linna Caprice on 05/14/2022 and  hb  drifted from 9.2-6.5- of note EBL in OR 10 cc and s/p IVF. She was transfused 1 unit on 3/2.  Procedure: IM fixation right femur 3/1  Consultation: Dr. [facility]    Subjective: Seen and examined this morning  overnight BP stable afebrile on room air Labs reviewed hemoglobin 6.5> 8.8> 7.5 g RESTING WELL on bedside chair, right thigh with bruising and swelling   Assessment and Plan: Principal Problem:   Right femoral fracture (HCC) Active Problems:   Seizure (HCC)   Osteoporosis   Severe protein-calorie malnutrition (HCC)   Chronic pain syndrome   Alcoholic cirrhosis of liver without ascites (HCC)   Anemia of chronic disease   Chronic diastolic heart failure (HCC)   Secondary pancreatic insufficiency   Hypothyroidism   Orthopedic aftercare for healing traumatic leg fracture, right, closed   Right femoral fracture due to fall and osteoporosis: Status post IM fixation right femur 3/1, postop ABLA.  Continue pain control DVT prophylaxis vitamin D supplementation, home Fosamax and PT OT.  Orthopedic input appreciated.  Normocytic anemia/thrombocytopenia:  Anemia thrombocytopenia multifactorial due to underlying liver cirrhosis ?  Drop in hemoglobin likely in the setting of ABLA patient does have increased swelling and bruise on the right hip ,s/p 2 units PRBC so far-hb up, monitor transfuse if <7 g.Anemia panel reviewed with stable folate B12 and iron. Recent Labs     09/06/22 1112 05/13/23 2102 05/14/23 0420 05/14/23 1504 05/15/23 0351 05/15/23 1449 05/15/23 1833 05/16/23 0347  HGB  --    < > 7.8* 8.6* 6.5*  --  8.8* 7.5*  MCV  --    < > 98.7 96.6 99.5  --   --  94.2  VITAMINB12 >2000*  --   --   --   --  914  --   --   FOLATE  --   --   --   --   --  18.2  --   --   FERRITIN  --   --   --   --   --  215  --   --   TIBC  --   --   --   --   --  168*  --   --   IRON  --   --   --   --   --  115  --   --   RETICCTPCT  --   --   --   --   --  4.1*  --   --    < > = values in this interval not displayed.    Alcoholic cirrhosis of liver without ascites/chronic pancreatitis/pancreatic insufficiency: cont creon  Chronic pain Cont home Butrans patch and Norco  per ortho   Hypothyroidism: Continue Synthroid.  Hypophosphatemia: Continue replacement  Osteoporosis? -Continue home Fosamax needs outpatient follow-up   Heart murmur: but TTE in 2023 without significant valvular lesion.  Likely in the setting of anemia.  Needs outpatient follow-up   Severe protein calorie malnutrition: With BMI 15.5, severe muscle and subcutaneous fat loss.  Augment diet cont creon  DVT  prophylaxis: SCDs Start: 05/14/23 1456 Place TED hose Start: 05/14/23 1456 SCDs Start: 05/13/23 2347 Code Status:   Code Status: Full Code Family Communication: plan of care discussed with patient at bedside. Patient status is: Remains hospitalized because of severity of illness Level of care: Med-Surg   Dispo: The patient is from: Home            Anticipated disposition: Anticipate discharge next 24 hours if hemoglobin remains stable   Objective: Vitals last 24 hrs: Vitals:   05/15/23 0947 05/15/23 1237 05/15/23 2151 05/16/23 0554  BP: 100/66 137/79 127/89 127/80  Pulse: 89 97 98 97  Resp:   18 18  Temp: 99.3 F (37.4 C) 98.4 F (36.9 C) (!) 97.3 F (36.3 C) 98.5 F (36.9 C)  TempSrc: Oral Oral    SpO2: 100% 99% 97% 98%  Weight:      Height:       Weight change:    Physical Examination: General exam: alert awake,at baseline, older than stated age HEENT:Oral mucosa moist, Ear/Nose WNL grossly Respiratory system: Bilaterally clear BS,no use of accessory muscle Cardiovascular system: S1 & S2 +, No JVD. Gastrointestinal system: Abdomen soft,NT,ND, BS+ Nervous System: Alert, awake, moving all extremities,and following commands. Extremities: Right femur appears bigger than the left with bruising, dressing intact LE edema neg,distal peripheral pulses palpable and warm.  Skin: No rashes,no icterus. MSK: Normal muscle bulk,tone, power   Medications reviewed:  Scheduled Meds:  sodium chloride   Intravenous Once   buprenorphine  2 patch Transdermal Weekly   docusate sodium  100 mg Oral BID   famotidine  10 mg Oral BID   feeding supplement  237 mL Oral TID BM   insulin aspart  0-15 Units Subcutaneous TID WC   insulin aspart  0-5 Units Subcutaneous QHS   levothyroxine  50 mcg Oral QAC breakfast   lipase/protease/amylase  24,000 Units Oral TID WC   multivitamin with minerals  1 tablet Oral Daily   phosphorus  500 mg Oral BID   senna  1 tablet Oral BID   Continuous Infusions:  Diet Order             Diet Carb Modified Fluid consistency: Thin; Room service appropriate? Yes  Diet effective now                  Intake/Output Summary (Last 24 hours) at 05/16/2023 1341 Last data filed at 05/16/2023 0900 Gross per 24 hour  Intake 700 ml  Output --  Net 700 ml   Net IO Since Admission: 4,967.69 mL [05/16/23 1341]  Wt Readings from Last 3 Encounters:  05/13/23 45.8 kg  04/12/23 45.8 kg  01/27/23 44.8 kg     Unresulted Labs (From admission, onward)     Start     Ordered   05/17/23 0500  Basic metabolic panel  Daily,   R      05/16/23 0951   05/17/23 0500  CBC  Daily,   R      05/16/23 0951          Data Reviewed: I have personally reviewed following labs and imaging studies CBC: Recent Labs  Lab 05/13/23 2102 05/14/23 0420  05/14/23 1504 05/15/23 0351 05/15/23 1833 05/16/23 0347  WBC 5.5 5.4 6.2 6.7  --  5.6  NEUTROABS 3.9  --   --   --   --   --   HGB 9.2* 7.8* 8.6* 6.5* 8.8* 7.5*  HCT 27.4* 23.0* 25.8* 20.1* 26.7* 22.7*  MCV 98.9 98.7 96.6 99.5  --  94.2  PLT 66* 61* 62* 47*  --  54*   Basic Metabolic Panel:  Recent Labs  Lab 05/13/23 2102 05/14/23 1504 05/15/23 0351 05/16/23 0347  NA 138 136 134* 136  K 4.2 4.0 3.5 3.7  CL 106 104 104 103  CO2 22 23 21* 26  GLUCOSE 142* 166* 210* 126*  BUN 15 18 15 13   CREATININE 0.87 0.70 0.69 0.58  CALCIUM 8.3* 7.6* 7.4* 7.7*  MG  --   --   --  2.0  PHOS  --   --   --  1.9*   GFR: Estimated Creatinine Clearance: 50.7 mL/min (by C-G formula based on SCr of 0.58 mg/dL). Liver Function Tests:  Recent Labs  Lab 05/14/23 1504 05/16/23 0347  AST 55*  --   ALT 29  --   ALKPHOS 84  --   BILITOT 1.4*  --   PROT 5.1*  --   ALBUMIN 2.6* 2.4*   Recent Labs    05/14/23 0420  HGBA1C 5.3   Recent Labs  Lab 05/15/23 1156 05/15/23 1618 05/15/23 2145 05/16/23 0739 05/16/23 1140  GLUCAP 81 174* 152* 161* 73  No results found for this or any previous visit (from the past 240 hours).  Antimicrobials/Microbiology: Anti-infectives (From admission, onward)    Start     Dose/Rate Route Frequency Ordered Stop   05/14/23 1800  ceFAZolin (ANCEF) IVPB 2g/100 mL premix        2 g 200 mL/hr over 30 Minutes Intravenous Every 6 hours 05/14/23 1455 05/14/23 2341   05/14/23 1014  ceFAZolin (ANCEF) 2-4 GM/100ML-% IVPB       Note to Pharmacy: Myrlene Broker M: cabinet override      05/14/23 1014 05/14/23 1209   05/14/23 0845  ceFAZolin (ANCEF) IVPB 2g/100 mL premix        2 g 200 mL/hr over 30 Minutes Intravenous On call to O.R. 05/14/23 0841 05/14/23 1145   05/14/23 0745  ceFAZolin (ANCEF) IVPB 2g/100 mL premix  Status:  Discontinued        2 g 200 mL/hr over 30 Minutes Intravenous On call to O.R. 05/14/23 0731 05/14/23 0858         Component Value Date/Time    SDES  03/13/2022 1520    BLOOD BLOOD LEFT ARM Performed at [facility], [address] [place], [place] [address]    SDES  03/13/2022 1520    BLOOD BLOOD RIGHT ARM Performed at [facility], [address] [place], [place] [address]    SPECREQUEST  03/13/2022 1520    BOTTLES DRAWN AEROBIC AND ANAEROBIC Blood Culture adequate volume Performed at [facility], [address], [place], [place] [address]    SPECREQUEST  03/13/2022 1520    BOTTLES DRAWN AEROBIC AND ANAEROBIC Blood Culture results may not be optimal due to an excessive volume of blood received in culture bottles Performed at [facility], [address] [place], [place] [address]    CULT  03/13/2022 1520    NO GROWTH 5 DAYS Performed at [facility], [address], [place], [place] [address]    CULT  03/13/2022 1520    NO GROWTH 5 DAYS Performed at [facility], [address], [place], [place] [address]    REPTSTATUS 03/18/2022 FINAL 03/13/2022 1520   REPTSTATUS 03/18/2022 FINAL 03/13/2022 1520     Radiology Studies: No results found.  LOS: 3 days  Total time spent in review of labs and imaging, patient evaluation, formulation of plan, documentation and communication with family: 35 minutes  Lanae Boast, MD  [facility]  05/16/2023, 1:41 PM

## 2023-05-16 NOTE — Hospital Course (Addendum)
 68 year old F with PMH of alcoholic cirrhosis, chronic pancreatitis with pancreatic insufficiency, chronic pain syndrome: Hypothyroidism, anemia, thrombocytopenia and osteoporosis presenting with right hip pain after she had mechanical fall and admitted with comminuted right proximal femoral fracture and underwent intramedullary nailing by Dr. Linna Caprice on 05/14/2022 and  hb  drifted from 9.2-6.5- of note EBL in OR 10 cc and s/p IVF. She was transfused 2 units PRBC.  Patient monitor overall she has clinically improved hemoglobin on discharge up at 8.4 g on 3/4 PT OT recommending home health.  Patient was given enema had a bowel movement 3/3.  At this time she is medically stable for discharge  Procedure: IM fixation right femur 3/1  Consultation: Dr. Linna [facility]

## 2023-05-16 NOTE — Progress Notes (Signed)
 Physical Therapy Treatment Patient Details Name: Carolyn [name] MRN: [id number] DOB: 1955/11/07 Today's Date: 05/16/2023   History of Present Illness Patient is a 68 year old female who presented after a fall on 2/28. Patient underwent IM nail on 3/1. Post op noted to have low hgb with one unit of PRBCs provided.  PMH: alcoholic cirrhosis, chronic pain syndrome, anemia, osteoporosis, hypothyroidism,Closed de-rotation and L tibial nail distal interlock screw placement.    PT Comments  Pt progressing this session despite elevated pain. Reviewed HEP and amb ~ 28' with RW and CGA to min assist. RN notified pt requesting pain meds. Continue PT POC    If plan is discharge home, recommend the following: A little help with walking and/or transfers;A little help with bathing/dressing/bathroom;Assistance with cooking/housework;Help with stairs or ramp for entrance   Can travel by private vehicle        Equipment Recommendations  None recommended by PT    Recommendations for Other Services       Precautions / Restrictions Precautions Precautions: Fall Recall of Precautions/Restrictions: Intact Restrictions Weight Bearing Restrictions Per Provider Order: No RLE Weight Bearing Per Provider Order: Weight bearing as tolerated     Mobility  Bed Mobility Overal bed mobility: Needs Assistance Bed Mobility: Sit to Supine, Supine to Sit     Supine to sit: HOB elevated, Min assist Sit to supine: Min assist   General bed mobility comments: assist with RLE d/t pain, incr time    Transfers Overall transfer level: Needs assistance Equipment used: Rolling walker (2 wheels) Transfers: Sit to/from Stand Sit to Stand: Min assist           General transfer comment: cues for hand placement and LE position    Ambulation/Gait Ambulation/Gait assistance: Contact guard assist, Min assist Gait Distance (Feet): 40 Feet Assistive device: Rolling walker (2 wheels) Gait Pattern/deviations: Step-to  pattern, Decreased stance time - right, Narrow base of support, Trunk flexed       General Gait Details: cues for sequence and RW position from self, intermittent assist to steady. cues for safety and technique when  turning   Stairs             Wheelchair Mobility     Tilt Bed    Modified Rankin (Stroke Patients Only)       Balance   Sitting-balance support: Feet unsupported, No upper extremity supported Sitting balance-Leahy Scale: Good     Standing balance support: Reliant on assistive device for balance, Bilateral upper extremity supported Standing balance-Leahy Scale: Poor Standing balance comment: needed BUE support to maintain balance.                            Communication Communication Communication: No apparent difficulties  Cognition Arousal: Alert Behavior During Therapy: WFL for tasks assessed/performed   PT - Cognitive impairments: No apparent impairments                         Following commands: Intact      Cueing Cueing Techniques: Verbal cues  Exercises General Exercises - Lower Extremity Ankle Circles/Pumps: AROM, Both, 10 reps Quad Sets: AROM, Strengthening, Right, 10 reps Short Arc Quad: AAROM, Strengthening, Right, 10 reps Heel Slides: AAROM, AROM, Right, 10 reps Hip ABduction/ADduction: AAROM, Right, 10 reps    General Comments        Pertinent Vitals/Pain Pain Assessment Pain Assessment: Faces Faces Pain Scale: Hurts worst Pain  Location: RLE with movement Pain Descriptors / Indicators: Grimacing, Discomfort, Guarding, Constant, Spasm, Tightness Pain Intervention(s): Limited activity within patient's tolerance, Monitored during session, Premedicated before session, Repositioned, Patient requesting pain meds-RN notified    Home Living                          Prior Function            PT Goals (current goals can now be found in the care plan section) Acute Rehab PT Goals PT Goal  Formulation: With patient Time For Goal Achievement: 05/23/23 Potential to Achieve Goals: Good Progress towards PT goals: Progressing toward goals    Frequency    Min 1X/week      PT Plan      Co-evaluation              AM-PAC PT "6 Clicks" Mobility   Outcome Measure  Help needed turning from your back to your side while in a flat bed without using bedrails?: A Little Help needed moving from lying on your back to sitting on the side of a flat bed without using bedrails?: A Little Help needed moving to and from a bed to a chair (including a wheelchair)?: A Little Help needed standing up from a chair using your arms (e.g., wheelchair or bedside chair)?: A Little Help needed to walk in hospital room?: A Little Help needed climbing 3-5 steps with a railing? : A Lot 6 Click Score: 17    End of Session Equipment Utilized During Treatment: Gait belt Activity Tolerance: Patient tolerated treatment well Patient left: in bed;with call bell/phone within reach;with bed alarm set Nurse Communication: Patient requests pain meds PT Visit Diagnosis: Other abnormalities of gait and mobility (R26.89)     Time: 1636-1700 PT Time Calculation (min) (ACUTE ONLY): 24 min  Charges:    $Gait Training: 8-22 mins $Therapeutic Exercise: 8-22 mins PT General Charges $$ ACUTE PT VISIT: 1 Visit                     Delice Bison, PT  Acute Rehab Dept [facility]) [phone]  05/16/2023    [facility] 05/16/2023, 5:07 PM

## 2023-05-17 ENCOUNTER — Other Ambulatory Visit: Payer: Self-pay

## 2023-05-17 ENCOUNTER — Other Ambulatory Visit (HOSPITAL_BASED_OUTPATIENT_CLINIC_OR_DEPARTMENT_OTHER): Payer: Self-pay

## 2023-05-17 ENCOUNTER — Encounter (HOSPITAL_COMMUNITY): Payer: Self-pay | Admitting: Orthopedic Surgery

## 2023-05-17 DIAGNOSIS — S72031A Displaced midcervical fracture of right femur, initial encounter for closed fracture: Secondary | ICD-10-CM | POA: Diagnosis not present

## 2023-05-17 LAB — CBC
HCT: 26.3 % — ABNORMAL LOW (ref 36.0–46.0)
Hemoglobin: 8.4 g/dL — ABNORMAL LOW (ref 12.0–15.0)
MCH: 32.1 pg (ref 26.0–34.0)
MCHC: 31.9 g/dL (ref 30.0–36.0)
MCV: 100.4 fL — ABNORMAL HIGH (ref 80.0–100.0)
Platelets: 76 10*3/uL — ABNORMAL LOW (ref 150–400)
RBC: 2.62 MIL/uL — ABNORMAL LOW (ref 3.87–5.11)
RDW: 15.9 % — ABNORMAL HIGH (ref 11.5–15.5)
WBC: 5 10*3/uL (ref 4.0–10.5)
nRBC: 0 % (ref 0.0–0.2)

## 2023-05-17 LAB — BASIC METABOLIC PANEL
Anion gap: 9 (ref 5–15)
BUN: 13 mg/dL (ref 8–23)
CO2: 28 mmol/L (ref 22–32)
Calcium: 7.7 mg/dL — ABNORMAL LOW (ref 8.9–10.3)
Chloride: 96 mmol/L — ABNORMAL LOW (ref 98–111)
Creatinine, Ser: 0.54 mg/dL (ref 0.44–1.00)
GFR, Estimated: >60 mL/min (ref >60)
Glucose, Bld: 154 mg/dL — ABNORMAL HIGH (ref 70–99)
Potassium: 3.9 mmol/L (ref 3.5–5.1)
Sodium: 133 mmol/L — ABNORMAL LOW (ref 135–145)

## 2023-05-17 LAB — GLUCOSE, CAPILLARY
Glucose-Capillary: 95 mg/dL (ref 70–99)
Glucose-Capillary: 204 mg/dL — ABNORMAL HIGH (ref 70–99)

## 2023-05-17 MED ORDER — SENNA 8.6 MG PO TABS
1.0000 | ORAL_TABLET | Freq: Two times a day (BID) | ORAL | 0 refills | Status: AC
  Filled 2023-05-17: qty 100, 50d supply, fill #0

## 2023-05-17 MED ORDER — METHOCARBAMOL 500 MG PO TABS
500.0000 mg | ORAL_TABLET | Freq: Four times a day (QID) | ORAL | 0 refills | Status: AC
  Filled 2023-05-17: qty 20, 5d supply, fill #0

## 2023-05-17 NOTE — Plan of Care (Signed)
 Discharge instructions given to the patient including medications and follow up.

## 2023-05-17 NOTE — Discharge Summary (Signed)
 Physician Discharge Summary  Carolyn [name] UJW:[id number] DOB: [birth year] DOA: 05/13/2023  PCP: Faith Rogue, DO  Admit date: 05/13/2023 Discharge date: 05/17/2023 Recommendations for Outpatient Follow-up:  Follow up with PCP in 1 weeks-call for appointment Please obtain BMP/CBC in one week  Discharge Dispo: [facility] Discharge Condition: Stable Code Status:   Code Status: Full Code Diet recommendation:  Diet Order             Diet Carb Modified Fluid consistency: Thin; Room service appropriate? Yes  Diet effective now                    Brief/Interim Summary: 68 year old F with PMH of alcoholic cirrhosis, chronic pancreatitis with pancreatic insufficiency, chronic pain syndrome: Hypothyroidism, anemia, thrombocytopenia and osteoporosis presenting with right hip pain after she had mechanical fall and admitted with comminuted right proximal femoral fracture and underwent intramedullary nailing by Dr. Linna Caprice on 05/14/2022 and  hb  drifted from 9.2-6.5- of note EBL in OR 10 cc and s/p IVF. She was transfused 2 units PRBC.  Patient monitor overall she has clinically improved hemoglobin on discharge up at 8.4 g on 3/4 PT OT recommending home health.  Patient was given enema had a bowel movement 3/3.  At this time she is medically stable for discharge  Procedure: IM fixation right femur 3/1  Consultation: Dr. Linna Caprice [facility]   Discharge Diagnoses:  Principal Problem:   Right femoral fracture (HCC) Active Problems:   Seizure (HCC)   Osteoporosis   Severe protein-calorie malnutrition (HCC)   Chronic pain syndrome   Alcoholic cirrhosis of liver without ascites (HCC)   Anemia of chronic disease   Chronic diastolic heart failure (HCC)   Secondary pancreatic insufficiency   Hypothyroidism   Orthopedic aftercare for healing traumatic leg fracture, right, closed  Right femoral fracture due to fall and osteoporosis: Status post IM fixation right femur 3/1, postop ABLA.  Overall doing  well, she will need follow-up with orthopedics as outpatient Continue PT OT DVT prophylaxis vitamin D  Will dc after another PT session today as she feels comfortable..   Normocytic anemia/thrombocytopenia:  Anemia thrombocytopenia multifactorial due to underlying liver cirrhosis ?  Drop in hemoglobin likely in the setting of ABLA patient does have increased swelling and bruise on the right hip ,s/p 2 units PRBC so far-hb up, monitor transfuse if <7 g.Anemia panel reviewed with stable folate B12 and iron.  Hemoglobin improved to 8.4 g Recent Labs  Lab 05/14/23 1504 05/15/23 0351 05/15/23 1833 05/16/23 0347 05/17/23 0342  HGB 8.6* 6.5* 8.8* 7.5* 8.4*  HCT 25.8* 20.1* 26.7* 22.7* 26.3*     Alcoholic cirrhosis of liver without ascites/chronic pancreatitis/pancreatic insufficiency: cont creon   Chronic pain Cont home Butrans patch and Norco  per ortho   Hypothyroidism: Continue Synthroid.   Hypophosphatemia: Replaced   Osteoporosis? -Continue home Fosamax needs outpatient follow-up   Heart murmur: but TTE in 2023 without significant valvular lesion.  Likely in the setting of anemia.  Needs outpatient follow-up   Severe protein calorie malnutrition: With BMI 15.5, severe muscle and subcutaneous fat loss.  Augment diet cont creon   Subjective: Alert awake oriented pain is controlled I am was bit nervous but after PT sessions feel comfortable going home  Discharge Exam: Vitals:   05/17/23 0618 05/17/23 1305  BP: 137/75 110/78  Pulse: 75 (!) 110  Resp: 14 16  Temp: 98.6 F (37 C) 98.6 F (37 C)  SpO2: 95% 98%   General: Pt is  alert, awake, not in acute distress Cardiovascular: RRR, S1/S2 +, no rubs, no gallops Respiratory: CTA bilaterally, no wheezing, no rhonchi Abdominal: Soft, NT, ND, bowel sounds + Extremities: no edema, no cyanosis  Discharge Instructions  Discharge Instructions     Ambulatory referral to Physical Therapy   Complete by: As directed     Discharge instructions   Complete by: As directed    Please call call MD or return to ER for similar or worsening recurring problem that brought you to hospital or if any fever,nausea/vomiting,abdominal pain, uncontrolled pain, chest pain,  shortness of breath or any other alarming symptoms.  Please follow-up your doctor as instructed in a week time and call the office for appointment.  Please avoid alcohol, smoking, or any other illicit substance and maintain healthy habits including taking your regular medications as prescribed.  You were cared for by a hospitalist during your hospital stay. If you have any questions about your discharge medications or the care you received while you were in the hospital after you are discharged, you can call the unit and ask to speak with the hospitalist on call if the hospitalist that took care of you is not available.  Once you are discharged, your primary care physician will handle any further medical issues. Please note that NO REFILLS for any discharge medications will be authorized once you are discharged, as it is imperative that you return to your primary care physician (or establish a relationship with a primary care physician if you do not have one) for your aftercare needs so that they can reassess your need for medications and monitor your lab values   Discharge wound care:   Complete by: As directed    Reinforce dressing as needed follow-up with orthopedics for dressing care   Increase activity slowly   Complete by: As directed       Allergies as of 05/17/2023       Reactions   Pecan Extract Anaphylaxis   Walnut Anaphylaxis        Medication List     STOP taking these medications    alendronate 70 MG/75ML solution Commonly known as: FOSAMAX       TAKE these medications    Belbuca 75 MCG Film Generic drug: Buprenorphine HCl Place 1 film (75 mcg) inside cheek daily at 6 (six) AM.   buprenorphine 20 MCG/HR Ptwk Commonly known  as: BUTRANS Place 1 patch onto the skin once a week.   clobetasol ointment 0.05 % Commonly known as: TEMOVATE Apply 1 Application topically at bedtime.   Creon 12000-38000 units Cpep capsule Generic drug: lipase/protease/amylase Take 2 capsules (24,000 Units total) by mouth 3 (three) times daily with meals. What changed: additional instructions   famotidine 10 MG tablet Commonly known as: PEPCID Take 10 mg by mouth 2 (two) times daily.   feeding supplement Liqd Take 237 mLs by mouth 3 (three) times daily between meals.   HYDROcodone-acetaminophen 5-325 MG tablet Commonly known as: NORCO/VICODIN Take 1 tablet by mouth every 4 (four) hours as needed for up to 7 days for moderate pain (pain score 4-6) or severe pain (pain score 7-10).   levothyroxine 50 MCG tablet Commonly known as: SYNTHROID Take 1 tablet (50 mcg total) by mouth daily at 6 (six) AM.   methocarbamol 500 MG tablet Commonly known as: ROBAXIN Take 1 tablet (500 mg total) by mouth 4 (four) times daily for 20 doses.   metoprolol tartrate 25 MG tablet Commonly known as: LOPRESSOR Take 1/2  tablet (12.5 mg total) by mouth 2 (two) times daily.   multivitamin with minerals Tabs tablet Take 1 tablet by mouth daily.   prochlorperazine 5 MG tablet Commonly known as: COMPAZINE Take 1 tablet (5 mg total) by mouth every 6 (six) hours as needed for nausea or vomiting.   pyridoxine 100 MG tablet Commonly known as: B-6 Take 1 tablet (100 mg total) by mouth daily.   senna 8.6 MG Tabs tablet Commonly known as: SENOKOT Take 1 tablet (8.6 mg total) by mouth 2 (two) times daily.   vitamin A 3 MG (10000 UNITS) capsule Take 1 capsule (10,000 Units total) by mouth every other day.   vitamin B-12 100 MCG tablet Commonly known as: CYANOCOBALAMIN Take 100 mcg by mouth daily.   vitamin D3 25 MCG tablet Commonly known as: CHOLECALCIFEROL Take 1 tablet (1,000 Units total) by mouth daily.               Discharge Care  Instructions  (From admission, onward)           Start     Ordered   05/17/23 0000  Discharge wound care:       Comments: Reinforce dressing as needed follow-up with orthopedics for dressing care   05/17/23 1124            Follow-up Information     Sheffield, Alain Honey, PA-C. Schedule an appointment as soon as possible for a visit in 2 week(s).   Specialty: Orthopedic Surgery Why: For suture removal, For wound re-check Contact information: [address], [address] [place] [address] [phone]         Faith Rogue, DO Follow up in 1 week(s).   Specialty: Internal Medicine Contact information: [address] [place] [address] [phone]                Allergies  Allergen Reactions   Pecan Extract Anaphylaxis   Walnut Anaphylaxis    The results of significant diagnostics from this hospitalization (including imaging, microbiology, ancillary and laboratory) are listed below for reference.    Microbiology: No results found for this or any previous visit (from the past 240 hours).  Procedures/Studies: DG HIP UNILAT W OR W/O PELVIS 2-3 VIEWS RIGHT Result Date: 05/14/2023 CLINICAL DATA:  Postop. EXAM: DG HIP (WITH OR WITHOUT PELVIS) 2-3V RIGHT COMPARISON:  Preoperative imaging FINDINGS: Femoral intramedullary nail with trans trochanteric and distal locking screw fixation traversing proximal femur fracture. Improved fracture alignment from preoperative imaging. Recent postsurgical change includes air and edema in the soft tissues with lateral skin staples in place. IMPRESSION: ORIF proximal femur fracture without immediate postoperative complication. Electronically Signed   By: Narda Rutherford M.D.   On: 05/14/2023 13:55   DG HIP UNILAT WITH PELVIS 1V RIGHT Result Date: 05/14/2023 CLINICAL DATA:  Elective surgery. EXAM: DG HIP (WITH OR WITHOUT PELVIS) 1V RIGHT COMPARISON:  Preoperative imaging FINDINGS: Five fluoroscopic spot views of the right hip submitted  from the operating room. Femoral intramedullary nail with trans trochanteric and distal locking screw fixation fixate proximal femur fracture. Fluoroscopy time 40 seconds. Dose 2.67 mGy IMPRESSION: Procedural fluoroscopy during right proximal femur fracture fixation. Electronically Signed   By: Narda Rutherford M.D.   On: 05/14/2023 13:54   DG C-Arm 1-60 Min-No Report Result Date: 05/14/2023 Fluoroscopy was utilized by the requesting physician.  No radiographic interpretation.   DG Knee Right Port Result Date: 05/14/2023 CLINICAL DATA:  History of proximal right femur fracture. EXAM: PORTABLE RIGHT KNEE -  1-2 VIEW COMPARISON:  None Available. FINDINGS: No significant joint effusion. Osteopenia. No fracture or dislocation. No significant arthropathy. Soft tissues appear normal. IMPRESSION: 1. No acute findings. 2. Osteopenia. Electronically Signed   By: Signa Kell M.D.   On: 05/14/2023 10:05   DG Hip Unilat W or Wo Pelvis 2-3 Views Right Result Date: 05/13/2023 CLINICAL DATA:  Status post fall. EXAM: DG HIP (WITH OR WITHOUT PELVIS) 2-3V RIGHT COMPARISON:  September 26, 2022 FINDINGS: There is an acute, comminuted fracture deformity extending through the inter trochanteric region of the proximal right femur. There is no evidence of dislocation. Mild degenerative changes are seen in the form of joint space narrowing and acetabular sclerosis. IMPRESSION: Acute, comminuted fracture of the proximal right femur. Electronically Signed   By: Aram Candela M.D.   On: 05/13/2023 20:26   DG Chest 1 View Result Date: 05/13/2023 CLINICAL DATA:  Status post fall. EXAM: CHEST  1 VIEW COMPARISON:  Jul 14, 2022 FINDINGS: The heart size and mediastinal contours are within normal limits. The lungs are hyperinflated. Mild, diffuse, chronic appearing increased interstitial lung markings are noted. There is no evidence of focal consolidation, pleural effusion or pneumothorax. Radiopaque surgical clips are seen within the upper  abdomen. A chronic eighth right rib fracture is noted. Multilevel degenerative changes are noted throughout the thoracic spine. IMPRESSION: Chronic appearing increased interstitial lung markings without evidence of acute or active cardiopulmonary disease. Electronically Signed   By: Aram Candela M.D.   On: 05/13/2023 20:23    Labs: BNP (last 3 results) No results for input(s): "BNP" in the last 8760 hours. Basic Metabolic Panel: Recent Labs  Lab 05/13/23 2102 05/14/23 1504 05/15/23 0351 05/16/23 0347 05/17/23 0342  NA 138 136 134* 136 133*  K 4.2 4.0 3.5 3.7 3.9  CL 106 104 104 103 96*  CO2 22 23 21* 26 28  GLUCOSE 142* 166* 210* 126* 154*  BUN 15 18 15 13 13   CREATININE 0.87 0.70 0.69 0.58 0.54  CALCIUM 8.3* 7.6* 7.4* 7.7* 7.7*  MG  --   --   --  2.0  --   PHOS  --   --   --  1.9*  --    Liver Function Tests: Recent Labs  Lab 05/14/23 1504 05/16/23 0347  AST 55*  --   ALT 29  --   ALKPHOS 84  --   BILITOT 1.4*  --   PROT 5.1*  --   ALBUMIN 2.6* 2.4*   No results for input(s): "LIPASE", "AMYLASE" in the last 168 hours. No results for input(s): "AMMONIA" in the last 168 hours. CBC: Recent Labs  Lab 05/13/23 2102 05/14/23 0420 05/14/23 1504 05/15/23 0351 05/15/23 1833 05/16/23 0347 05/17/23 0342  WBC 5.5 5.4 6.2 6.7  --  5.6 5.0  NEUTROABS 3.9  --   --   --   --   --   --   HGB 9.2* 7.8* 8.6* 6.5* 8.8* 7.5* 8.4*  HCT 27.4* 23.0* 25.8* 20.1* 26.7* 22.7* 26.3*  MCV 98.9 98.7 96.6 99.5  --  94.2 100.4*  PLT 66* 61* 62* 47*  --  54* 76*   Cardiac Enzymes: No results for input(s): "CKTOTAL", "CKMB", "CKMBINDEX", "TROPONINI" in the last 168 hours. BNP: Invalid input(s): "POCBNP" CBG: Recent Labs  Lab 05/16/23 1140 05/16/23 1636 05/16/23 2320 05/17/23 0723 05/17/23 1118  GLUCAP 73 206* 98 95 204*   D-Dimer No results for input(s): "DDIMER" in the last 72 hours. Hgb A1c No  results for input(s): "HGBA1C" in the last 72 hours. Lipid Profile No  results for input(s): "CHOL", "HDL", "LDLCALC", "TRIG", "CHOLHDL", "LDLDIRECT" in the last 72 hours. Thyroid function studies No results for input(s): "TSH", "T4TOTAL", "T3FREE", "THYROIDAB" in the last 72 hours.  Invalid input(s): "FREET3" Anemia work up Recent Labs    05/15/23 1449  VITAMINB12 914  FOLATE 18.2  FERRITIN 215  TIBC 168*  IRON 115  RETICCTPCT 4.1*   Urinalysis    Component Value Date/Time   COLORURINE YELLOW 09/30/2017 1449   APPEARANCEUR CLEAR 09/30/2017 1449   LABSPEC <1.005 (L) 09/30/2017 1449   PHURINE 6.5 09/30/2017 1449   GLUCOSEU NEGATIVE 09/30/2017 1449   HGBUR NEGATIVE 09/30/2017 1449   BILIRUBINUR NEGATIVE 09/30/2017 1449   KETONESUR NEGATIVE 09/30/2017 1449   PROTEINUR NEGATIVE 09/30/2017 1449   UROBILINOGEN 4.0 (H) 06/07/2011 0007   NITRITE NEGATIVE 09/30/2017 1449   LEUKOCYTESUR TRACE (A) 09/30/2017 1449   Sepsis Labs Recent Labs  Lab 05/14/23 1504 05/15/23 0351 05/16/23 0347 05/17/23 0342  WBC 6.2 6.7 5.6 5.0   Microbiology No results found for this or any previous visit (from the past 240 hours).   Time coordinating discharge: 35 minutes  SIGNED: Lanae Boast, MD  [facility] 05/17/2023, 1:36 PM  If 7PM-7AM, please contact night-coverage [email or web address]

## 2023-05-17 NOTE — Progress Notes (Signed)
 PT TX NOTE  05/17/23 1600  PT Visit Information  Last PT Received On 05/17/23  Assistance Needed Reviewed mobility as below, pt making steady progress, meeting goals.  discussed use of w/c if needed for safety/fatigue, taking extra time with transitions and when amb to incr safety at home. HEP reviewed and plan is for OPPT. Pt reports feeling more confident this pm regarding d/c. Anticipate pt will continue to progress well in next venue; may need incr assist/supervision from spouse initially (he has flexible schedule per pt)  History of Present Illness Patient is a 68 year old female who presented after a fall on 2/28. Patient underwent IM nail on 3/1. Post op noted to have low hgb with one unit of PRBCs provided.  PMH: alcoholic cirrhosis, chronic pain syndrome, anemia, osteoporosis, hypothyroidism,Closed de-rotation and L tibial nail distal interlock screw placement.  Precautions  Precautions Fall  Recall of Precautions/Restrictions Intact  Restrictions  Weight Bearing Restrictions Per Provider Order No  RLE Weight Bearing Per Provider Order WBAT  Pain Assessment  Pain Assessment Faces  Faces Pain Scale 6  Pain Location RLE with movement  Pain Descriptors / Indicators Grimacing;Discomfort;Guarding;Constant;Spasm;Tightness  Pain Intervention(s) Limited activity within patient's tolerance;Monitored during session;Premedicated before session;Repositioned;Ice applied  Cognition  Arousal Alert  Behavior During Therapy WFL for tasks assessed/performed  PT - Cognitive impairments No apparent impairments  Following Commands  Following commands Intact  Cueing  Cueing Techniques Verbal cues  Communication  Communication No apparent difficulties  Bed Mobility  Overal bed mobility Needs Assistance  Bed Mobility Sit to Supine  Sit to supine Supervision  General bed mobility comments incr time, able to self assist with leg lifter  Transfers  Overall transfer level Needs assistance  Equipment  used Rolling walker (2 wheels)  Transfers Sit to/from Stand  Sit to Stand Supervision;Contact guard assist  General transfer comment cues for hand placement and LE position  Ambulation/Gait  Ambulation/Gait assistance Supervision;Contact guard assist  Gait Distance (Feet) 120 Feet  Assistive device Rolling walker (2 wheels)  Gait Pattern/deviations Step-to pattern;Decreased stance time - right;Narrow base of support;Trunk flexed  General Gait Details cues for sequence and RW position from self, CGA for  safety and technique when  turning. no overt LOB  Gait velocity decr  Balance  Overall balance assessment Needs assistance  Sitting-balance support Feet unsupported;No upper extremity supported  Sitting balance-Leahy Scale Good  Standing balance support Reliant on assistive device for balance;Bilateral upper extremity supported  Standing balance comment BUE support to maintain balance.  General Exercises - Lower Extremity  Ankle Circles/Pumps AROM;Both;10 reps  Short Arc Quad AAROM;Strengthening;Right;10 reps  Heel Slides AAROM;AROM;Right;10 reps  PT - End of Session  Equipment Utilized During Treatment Gait belt  Activity Tolerance Patient tolerated treatment well  Patient left with call bell/phone within reach;in bed;with bed alarm set  Nurse Communication Patient requests pain meds   PT - Assessment/Plan  PT Visit Diagnosis Other abnormalities of gait and mobility (R26.89)  PT Frequency (ACUTE ONLY) Min 1X/week  Follow Up Recommendations Outpatient PT (pt  prefers OPPT)  Patient can return home with the following A little help with walking and/or transfers;A little help with bathing/dressing/bathroom;Assistance with cooking/housework;Help with stairs or ramp for entrance  PT equipment None recommended by PT  AM-PAC PT "6 Clicks" Mobility Outcome Measure (Version 2)  Help needed turning from your back to your side while in a flat bed without using bedrails? 3  Help needed moving  from lying on your back to  sitting on the side of a flat bed without using bedrails? 3  Help needed moving to and from a bed to a chair (including a wheelchair)? 3  Help needed standing up from a chair using your arms (e.g., wheelchair or bedside chair)? 3  Help needed to walk in hospital room? 3  Help needed climbing 3-5 steps with a railing?  3  6 Click Score 18  Consider Recommendation of Discharge To: Home with [facility]  PT Goal Progression  Progress towards PT goals Progressing toward goals  Acute Rehab PT Goals  PT Goal Formulation With patient  Time For Goal Achievement 05/23/23  Potential to Achieve Goals Good  PT Time Calculation  PT Start Time (ACUTE ONLY) 1458  PT Stop Time (ACUTE ONLY) 1522  PT Time Calculation (min) (ACUTE ONLY) 24 min  PT General Charges  $$ ACUTE PT VISIT 1 Visit  PT Treatments  $Gait Training 23-37 mins

## 2023-05-17 NOTE — TOC Transition Note (Signed)
 Transition of Care [facility]) - Discharge Note   Patient Details  Name: Carolyn [name] MRN: [id number] Date of Birth: 07/07/1955  Transition of Care [facility] CM/SW Contact:  Amada Jupiter, LCSW Phone Number: 05/17/2023, 10:49 AM   Clinical Narrative:    Met with pt who confirms she has needed DME in the home.  Reviewed PT recommendation for HHPT, however, pt prefers OPPT and requesting [facility] on [address] since she has gone to this site before.  Referral sent to OPPT and pt aware the center should now contact her directly to set up appointments.  Pt understands.  Hopeful she will be ready to dc home today with spouse.   Final next level of care: OP Rehab Barriers to Discharge: No Barriers Identified   Patient Goals and CMS Choice Patient states their goals for this hospitalization and ongoing recovery are:: return home          Discharge Placement                       Discharge Plan and Services Additional resources added to the After Visit Summary for                  DME Arranged: N/A DME Agency: NA                  Social Drivers of Health (SDOH) Interventions SDOH Screenings   Food Insecurity: No Food Insecurity (05/14/2023)  Housing: Low Risk  (05/14/2023)  Transportation Needs: No Transportation Needs (05/14/2023)  Utilities: Not At Risk (05/14/2023)  Alcohol Screen: Low Risk  (07/13/2022)  Depression (PHQ2-9): Low Risk  (04/12/2023)  Financial Resource Strain: Low Risk  (07/13/2022)  Physical Activity: Insufficiently Active (07/13/2022)  Social Connections: Moderately Isolated (05/14/2023)  Stress: No Stress Concern Present (07/13/2022)  Tobacco Use: Low Risk  (05/13/2023)     Readmission Risk Interventions    05/17/2023   10:48 AM  Readmission Risk Prevention Plan  Transportation Screening Complete  PCP or Specialist Appt within 5-7 Days Complete  Home Care Screening Complete  Medication Review (RN CM) Complete

## 2023-05-17 NOTE — Progress Notes (Signed)
 Physical Therapy Treatment Patient Details Name: HILDY NICHOLL MRN: [id number] DOB: 1956-02-01 Today's Date: 05/17/2023   History of Present Illness Patient is a 68 year old female who presented after a fall on 2/28. Patient underwent IM nail on 3/1. Post op noted to have low hgb with one unit of PRBCs provided.  PMH: alcoholic cirrhosis, chronic pain syndrome, anemia, osteoporosis, hypothyroidism,Closed de-rotation and L tibial nail distal interlock screw placement.    PT Comments  Pt is progressing well this session, amb incr distance and requiring decr assist overall; will see again in pm and pt should be ready to d/c later today    If plan is discharge home, recommend the following: A little help with walking and/or transfers;A little help with bathing/dressing/bathroom;Assistance with cooking/housework;Help with stairs or ramp for entrance   Can travel by private vehicle        Equipment Recommendations  None recommended by PT    Recommendations for Other Services       Precautions / Restrictions Precautions Precautions: Fall Recall of Precautions/Restrictions: Intact Restrictions Weight Bearing Restrictions Per Provider Order: No RLE Weight Bearing Per Provider Order: Weight bearing as tolerated     Mobility  Bed Mobility Overal bed mobility: Needs Assistance Bed Mobility: Supine to Sit     Supine to sit: Supervision     General bed mobility comments: incr time, able to self assist with leg lifter    Transfers Overall transfer level: Needs assistance Equipment used: Rolling walker (2 wheels) Transfers: Sit to/from Stand Sit to Stand: Supervision, Contact guard assist           General transfer comment: cues for hand placement and LE position    Ambulation/Gait Ambulation/Gait assistance: Supervision, Contact guard assist Gait Distance (Feet): 120 Feet Assistive device: Rolling walker (2 wheels) Gait Pattern/deviations: Step-to pattern, Decreased stance  time - right, Narrow base of support, Trunk flexed Gait velocity: decr     General Gait Details: cues for sequence and RW position from self, CGA for  safety and technique when  turning. no overt LOB   Stairs             Wheelchair Mobility     Tilt Bed    Modified Rankin (Stroke Patients Only)       Balance Overall balance assessment: Needs assistance Sitting-balance support: Feet unsupported, No upper extremity supported Sitting balance-Leahy Scale: Good     Standing balance support: Reliant on assistive device for balance, Bilateral upper extremity supported   Standing balance comment: BUE support to maintain balance.                            Communication Communication Communication: No apparent difficulties  Cognition Arousal: Alert Behavior During Therapy: WFL for tasks assessed/performed   PT - Cognitive impairments: No apparent impairments                         Following commands: Intact      Cueing Cueing Techniques: Verbal cues  Exercises      General Comments        Pertinent Vitals/Pain Pain Assessment Pain Assessment: Faces Faces Pain Scale: Hurts even more Pain Location: RLE with movement Pain Descriptors / Indicators: Grimacing, Discomfort, Guarding, Constant, Spasm, Tightness Pain Intervention(s): Limited activity within patient's tolerance, Monitored during session, Premedicated before session, Repositioned, Ice applied    Home Living  Prior Function            PT Goals (current goals can now be found in the care plan section) Acute Rehab PT Goals PT Goal Formulation: With patient Time For Goal Achievement: 05/23/23 Potential to Achieve Goals: Good Progress towards PT goals: Progressing toward goals    Frequency    Min 1X/week      PT Plan      Co-evaluation              AM-PAC PT "6 Clicks" Mobility   Outcome Measure  Help needed turning from  your back to your side while in a flat bed without using bedrails?: A Little Help needed moving from lying on your back to sitting on the side of a flat bed without using bedrails?: A Little Help needed moving to and from a bed to a chair (including a wheelchair)?: A Little Help needed standing up from a chair using your arms (e.g., wheelchair or bedside chair)?: A Little Help needed to walk in hospital room?: A Little Help needed climbing 3-5 steps with a railing? : A Little 6 Click Score: 18    End of Session Equipment Utilized During Treatment: Gait belt Activity Tolerance: Patient tolerated treatment well Patient left: in chair;with call bell/phone within reach;with chair alarm set Nurse Communication: Patient requests pain meds PT Visit Diagnosis: Other abnormalities of gait and mobility (R26.89)     Time: 0981-1914 PT Time Calculation (min) (ACUTE ONLY): 25 min  Charges:    $Gait Training: 23-37 mins PT General Charges $$ ACUTE PT VISIT: 1 Visit                     Jaidin Richison, PT  Acute Rehab Dept [facility]) [phone]  05/17/2023    [facility] 05/17/2023, 1:35 PM

## 2023-05-25 ENCOUNTER — Telehealth: Payer: Self-pay | Admitting: Student

## 2023-05-25 NOTE — Telephone Encounter (Signed)
 Copied from CRM [id number]. Topic: Appointments - Appointment Scheduling >> May 25, 2023 11:29 AM [name] wrote: Patient is calling to schedule a hospital follow up appointment. Schedules are going out to April 7th. I don't get the option to schedule sooner. I've informed patient that she will get a call back to schedule this appointment.  Pt has been called and sch for  Name: Carolyn [name], Neuzil MRN: [id number]  Date: 05/30/2023 Status: Sch  Time: 8:45 AM Length: 30  Visit Type: OPEN ESTABLISHED [726] Copay: $0.00  Provider: Katheran James, DO

## 2023-05-27 ENCOUNTER — Other Ambulatory Visit (HOSPITAL_COMMUNITY): Payer: Self-pay | Admitting: Medical

## 2023-05-27 ENCOUNTER — Ambulatory Visit (HOSPITAL_COMMUNITY)
Admission: RE | Admit: 2023-05-27 | Discharge: 2023-05-27 | Disposition: A | Discharge status: Home / Self Care | Source: Ambulatory Visit | Attending: Cardiovascular Disease | Admitting: Cardiovascular Disease

## 2023-05-27 ENCOUNTER — Other Ambulatory Visit (HOSPITAL_BASED_OUTPATIENT_CLINIC_OR_DEPARTMENT_OTHER): Payer: Self-pay

## 2023-05-27 ENCOUNTER — Ambulatory Visit (HOSPITAL_COMMUNITY): Payer: Medicare PPO

## 2023-05-27 DIAGNOSIS — M79604 Pain in right leg: Secondary | ICD-10-CM

## 2023-05-27 DIAGNOSIS — M7989 Other specified soft tissue disorders: Secondary | ICD-10-CM | POA: Insufficient documentation

## 2023-05-28 ENCOUNTER — Ambulatory Visit
Admission: RE | Admit: 2023-05-28 | Discharge: 2023-05-28 | Disposition: A | Discharge status: Home / Self Care | Payer: Medicare PPO | Source: Ambulatory Visit | Attending: Internal Medicine | Admitting: Internal Medicine

## 2023-05-28 DIAGNOSIS — M810 Age-related osteoporosis without current pathological fracture: Secondary | ICD-10-CM

## 2023-05-30 ENCOUNTER — Encounter: Payer: Self-pay | Admitting: Student

## 2023-05-30 ENCOUNTER — Encounter: Payer: Self-pay | Admitting: Physical Therapy

## 2023-05-30 ENCOUNTER — Other Ambulatory Visit (HOSPITAL_COMMUNITY): Payer: Self-pay

## 2023-05-30 ENCOUNTER — Other Ambulatory Visit: Payer: Self-pay

## 2023-05-30 ENCOUNTER — Ambulatory Visit (INDEPENDENT_AMBULATORY_CARE_PROVIDER_SITE_OTHER): Admitting: Student

## 2023-05-30 ENCOUNTER — Ambulatory Visit: Attending: Internal Medicine | Admitting: Physical Therapy

## 2023-05-30 VITALS — BP 118/63 | HR 78

## 2023-05-30 VITALS — BP 114/70 | HR 73 | Temp 98.3°F | Ht 68.0 in

## 2023-05-30 DIAGNOSIS — S72031D Displaced midcervical fracture of right femur, subsequent encounter for closed fracture with routine healing: Secondary | ICD-10-CM | POA: Diagnosis not present

## 2023-05-30 DIAGNOSIS — R269 Unspecified abnormalities of gait and mobility: Secondary | ICD-10-CM | POA: Insufficient documentation

## 2023-05-30 DIAGNOSIS — R2681 Unsteadiness on feet: Secondary | ICD-10-CM | POA: Insufficient documentation

## 2023-05-30 DIAGNOSIS — M25572 Pain in left ankle and joints of left foot: Secondary | ICD-10-CM | POA: Insufficient documentation

## 2023-05-30 DIAGNOSIS — M25551 Pain in right hip: Secondary | ICD-10-CM | POA: Insufficient documentation

## 2023-05-30 DIAGNOSIS — D638 Anemia in other chronic diseases classified elsewhere: Secondary | ICD-10-CM | POA: Diagnosis not present

## 2023-05-30 DIAGNOSIS — S72002A Fracture of unspecified part of neck of left femur, initial encounter for closed fracture: Secondary | ICD-10-CM | POA: Diagnosis not present

## 2023-05-30 DIAGNOSIS — M6281 Muscle weakness (generalized): Secondary | ICD-10-CM | POA: Diagnosis present

## 2023-05-30 DIAGNOSIS — M8080XP Other osteoporosis with current pathological fracture, unspecified site, subsequent encounter for fracture with malunion: Secondary | ICD-10-CM

## 2023-05-30 LAB — BASIC METABOLIC PANEL
Anion gap: 8 (ref 5–15)
BUN: 23 mg/dL (ref 8–23)
CO2: 19 mmol/L — ABNORMAL LOW (ref 22–32)
Calcium: 8.6 mg/dL — ABNORMAL LOW (ref 8.9–10.3)
Chloride: 109 mmol/L (ref 98–111)
Creatinine, Ser: 0.82 mg/dL (ref 0.44–1.00)
GFR, Estimated: >60 mL/min (ref >60)
Glucose, Bld: 75 mg/dL (ref 70–99)
Potassium: 3.9 mmol/L (ref 3.5–5.1)
Sodium: 136 mmol/L (ref 135–145)

## 2023-05-30 LAB — CBC
HCT: 30.6 % — ABNORMAL LOW (ref 36.0–46.0)
Hemoglobin: 9.6 g/dL — ABNORMAL LOW (ref 12.0–15.0)
MCH: 32.1 pg (ref 26.0–34.0)
MCHC: 31.4 g/dL (ref 30.0–36.0)
MCV: 102.3 fL — ABNORMAL HIGH (ref 80.0–100.0)
Platelets: 282 10*3/uL (ref 150–400)
RBC: 2.99 MIL/uL — ABNORMAL LOW (ref 3.87–5.11)
RDW: 14.6 % (ref 11.5–15.5)
WBC: 6.3 10*3/uL (ref 4.0–10.5)
nRBC: 0 % (ref 0.0–0.2)

## 2023-05-30 MED ORDER — HYDROCODONE-ACETAMINOPHEN 5-325 MG PO TABS
1.0000 | ORAL_TABLET | Freq: Four times a day (QID) | ORAL | 0 refills | Status: DC | PRN
  Filled 2023-05-30: qty 10, 3d supply, fill #0

## 2023-05-30 MED ORDER — VITAMIN D3 25 MCG PO TABS
1000.0000 [IU] | ORAL_TABLET | Freq: Every day | ORAL | Status: AC
Start: 2023-05-30 — End: ?

## 2023-05-30 NOTE — Assessment & Plan Note (Signed)
-   CBC today. Per above, required transfusion in hospital.

## 2023-05-30 NOTE — Therapy (Signed)
 OUTPATIENT PHYSICAL THERAPY NEURO EVALUATION   Patient Name: Carolyn [name] MRN: [id number] DOB:[birth year], 68 y.o., female Today's Date: 05/30/2023   PCP: Faith Rogue, DO REFERRING PROVIDER: Lanae Boast, MD  END OF SESSION:  PT End of Session - 05/30/23 1236     Visit Number 1    Number of Visits 9    Date for PT Re-Evaluation 07/11/23    Authorization Type [employer]    PT Start Time 1233    PT Stop Time 1318    PT Time Calculation (min) 45 min    Equipment Utilized During Treatment Gait belt    Activity Tolerance Patient tolerated treatment well    Behavior During Therapy WFL for tasks assessed/performed             Past Medical History:  Diagnosis Date   Alcoholic cirrhosis (HCC)    Chronic gastric ulcer with perforation (HCC) 01/19/2022   Chronic pancreatitis (HCC)    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 Ernesto Rutherford dermatology in [place].      Diabetes mellitus without complication (HCC)    Endometriosis    Gastroesophageal reflux disease without esophagitis 05/08/2017   GERD (gastroesophageal reflux disease)    Intractable nausea and vomiting 06/13/2020   Liver disease    Malabsorption    MALABSORPTION SYNDROME   Osteoporosis 03/2011   t score -2.5   Pancreatitis    due to cyst and tumors due to calcifications   Seizure (HCC)    no seizure disorder, controlled w/ meds per patient   Vitamin D deficiency 2012   VIT D 15   Past Surgical History:  Procedure Laterality Date   APPENDECTOMY     BIOPSY  01/19/2022   Procedure: BIOPSY;  Surgeon: Shellia Cleverly, DO;  Location: [facility];  Service: Gastroenterology;;   CATARACT EXTRACTION Right    CHOLECYSTECTOMY     ESOPHAGOGASTRODUODENOSCOPY (EGD) WITH PROPOFOL N/A 01/19/2022   Procedure: ESOPHAGOGASTRODUODENOSCOPY (EGD) WITH PROPOFOL;  Surgeon: Shellia Cleverly, DO;  Location: [facility];  Service: Gastroenterology;  Laterality: N/A;   FEEDING TUBE RELOCATION  2010    FEMUR IM NAIL Right 05/14/2023   Procedure: INTRAMEDULLARY (IM) NAIL FEMORAL, RIGHT HIP;  Surgeon: Samson Frederic, MD;  Location: [facility];  Service: Orthopedics;  Laterality: Right;   JEJUNOSTOMY FEEDING TUBE  1990   MULTIPLE GASTRIC SURGERIES     MYOMECTOMY     OPEN REDUCTION INTERNAL FIXATION (ORIF) DISTAL RADIAL FRACTURE Left 06/29/2018   Procedure: OPEN REDUCTION INTERNAL FIXATION (ORIF)LEFT  DISTAL RADIAL FRACTURE;  Surgeon: Betha Loa, MD;  Location: [facility];  Service: Orthopedics;  Laterality: Left;   ROUX-EN-Y GASTRIC BYPASS     TIBIA IM NAIL INSERTION Left 04/21/2022   Procedure: LEFT INTRAMEDULLARY (IM) NAIL TIBIAL;  Surgeon: Eldred Manges, MD;  Location: [facility];  Service: Orthopedics;  Laterality: Left;   TIBIA IM NAIL INSERTION Left 05/05/2022   Procedure: LEFT TIBIAL NAIL DISTAL SCREW PLACEMENT;  Surgeon: Eldred Manges, MD;  Location: [facility];  Service: Orthopedics;  Laterality: Left;   TUBAL LIGATION     Patient Active Problem List   Diagnosis Date Noted   Orthopedic aftercare for healing traumatic leg fracture, right, closed 05/14/2023   Right femoral fracture (HCC) 05/13/2023   Elevated blood pressure reading 10/04/2022   Healthcare maintenance 09/06/2022   Hypothyroidism 06/23/2022   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   Abnormal loss of weight 03/23/2022   Chronic diastolic heart failure (HCC) 03/23/2022  Anemia of chronic disease 03/17/2022   Alcoholic cirrhosis of liver without ascites (HCC) 01/18/2022   Portal hypertensive gastropathy (HCC) 01/18/2022   Physical deconditioning 01/09/2022   Severe protein-calorie malnutrition (HCC) 01/07/2022   Normocytic anemia 01/07/2022   Osteomyelitis (HCC) 01/06/2022   Seizure (HCC) 05/08/2017   Chronic pain syndrome 05/08/2017   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HCC) 05/08/2017   Osteoporosis 03/16/2011   Malabsorption     ONSET DATE: 05/17/2023 (referral date)  REFERRING DIAG: S72.002A  (ICD-10-CM) - Hip fx, left, closed, initial encounter (HCC)  THERAPY DIAG:  Abnormality of gait and mobility - Plan: PT plan of care cert/re-cert  Unsteadiness on feet - Plan: PT plan of care cert/re-cert  Pain in right hip - Plan: PT plan of care cert/re-cert  Rationale for Evaluation and Treatment: Rehabilitation  SUBJECTIVE:                                                                                                                                                                                             SUBJECTIVE STATEMENT: Patient reports extensive history of fx due to osteoporosis and falls. Patient [facility] in [facility]  Patient had a break of tibia and fibula about a year ago which left her with poor fixed position of her L foot. Patient reports most recent break (R hip fx) was Friday the 2/28 and she had a R ORIF the following day for repair. Patient reports after her fall a year ago, she went from [facility], cane, walker, to nothing prior to most recent falls. Patient arrives today in transport chair; she is primarily walking with 2WW at home but uses [facility] for long distances. Patient reports that she is hoping to get back to no devices if possible. Unclear cause of all falls but reports one due to medications, possible vision changes following cataract surgeries, and transfer to toilet.   Pt accompanied by: self and significant other - Don  PERTINENT HISTORY: multiple falls and fractures, alcoholic cirrhosis, chronic pancreatitis with pancreatic insufficiency, chronic pain syndrome, Hypothyroidism, anemia, thrombocytopenia and osteoporosis  PAIN:  Are you having pain? Yes: NPRS scale: 5/10 Pain location: R hip Pain description: achy with some sharp Aggravating factors: getting up from sitting position, getting up from activity  Relieving factors: pain medication  PRECAUTIONS: WBAT, seizures in past but been over 20 years since last one, falls risk, osteoporsis   RED  FLAGS: None   WEIGHT BEARING RESTRICTIONS: Yes WBAT  FALLS: Has patient fallen in last 6 months? Yes. Number of falls 2 falls within the last 6 months, one with injury and one without, and a couple near falls  LIVING ENVIRONMENT: Lives with: lives with their partner Lives in: House/apartment Stairs: Yes: External: 6 steps; can reach both Has following equipment at home: Single point cane, Walker - 2 wheeled, Wheelchair (manual), Shower bench, bed side commode, and Grab bars  PLOF: Independent - retired as working as Tour manager   PATIENT GOALS: "Being able to walk without anything."   OBJECTIVE:  Note: Objective measures were completed at Evaluation unless otherwise noted.  DIAGNOSTIC FINDINGS:   05/28/2023 Bone Density Scores: Site Region Measured Date Measured Age YA BMD Significant CHANGE T-score Right Forearm Radius 33% 05/28/2023 65.9 -3.6 0.557 g/cm2 AP Spine L1-L4 05/28/2023 65.9 -2.8 0.841 g/cm2  Left Femur Neck 05/28/2023 65.9 -3.0 0.617 g/cm2   05/14/2023 Hip W/O Pelvis: IMPRESSION: ORIF proximal femur fracture without immediate postoperative complication.  COGNITION: Overall cognitive status: Within functional limits for tasks assessed   SENSATION: WFL  EDEMA:  Yes - swelling and bruising on R lower leg, they did ultrasound and x-ray and no blood clot found or new fx found, they are closely monitoring, 10cm by 0.25 cm healing scratch on lower leg due to patient falling through broken step per patient  LOWER EXTREMITY ROM:     Only able to achieve ~95 degrees of active hip flexion and lacking ~20 degrees of active knee extension against gravity with possible mild contracture as PT unable to extend far past PROM but did not push due to healing fx sight   LOWER EXTREMITY MMT:    Not formally tested due to extent of osteoporosis and healing fractures, full ROM against gravity with exception of some L ankle ROM limitations due old ORIF on L side, 3-/5 on hip  flexion on R and knee extension   TRANSFERS: Assistive device utilized: Environmental consultant - 2 wheeled and Wheelchair (manual)  Sit to stand: SBA Stand to sit: SBA Chair to chair: SBA  GAIT: Gait pattern:  decreased stride on LLE, initially toe walking due to pain on that side and then able to archieve some heel contact, rotation at tibia from old fx on R   Distance walked: various clinic distances Assistive device utilized: Environmental consultant - 4 wheeled Level of assistance: SBA Comments:   FUNCTIONAL TESTS:    [facility] PT Assessment - 05/30/23 0001       Standardized Balance Assessment   Standardized Balance Assessment Five Times Sit to Stand;10 meter walk test    Five times sit to stand comments  29.47   seconds with bilateral UE use and walker in front (SBA)   10 Meter Walk 0.46   with 2WW (SBA)                                                                                                                             TREATMENT:    Self Care: Educated on appropriate heel strike on gait, discussed whether or not to bring [facility] versus 2WW to future sessions, encouraged MWC for next 1-2 sessions to assess fatigue  and then progress beyond, educated on positioning in [facility] and encouraged booster seat as patient currently with leg rest sitting with greater hip flexion than when be ideal, recommend discussing with surgeon any additional precautions   PATIENT EDUCATION: Education details: POC, goal collaboration, examination findings  Person educated: Patient and partner Education method: Explanation Education comprehension: verbalized understanding  HOME EXERCISE PROGRAM: To be provided   GOALS: Goals reviewed with patient? Yes  LONG TERM GOALS: Target date: 07/11/2023 (LTG = STG due to POC length)  Patient will report demonstrate independence with final HEP in order to maintain current gains and continue to progress after physical therapy discharge.   Baseline: To be provided  Goal status:  INITIAL  2.  Patient will improve gait speed to 0.56 m/s with LRAD to indicate improvement to the level of community ambulator in order to participate more easily in activities outside of the home.   Baseline: 0.46 m/s with 2WW (SBA) Goal status: INITIAL  3.  Patient will improve their 5x Sit to Stand score to less than 25 seconds to demonstrate a decreased risk for falls and improved LE strength.   Baseline: 29.47 seconds with UE use  Goal status: INITIAL  4. TUG to be assessed and LTG written Baseline: To be assessed  Goal status: INITIAL  ASSESSMENT:  CLINICAL IMPRESSION: Patient is a 68 y.o. female who was seen today for physical therapy evaluation and treatment secondary to ORIF following a R hip fx due to a fall. Patient with PMH of osteoporosis and other falls with breaks. Patient was walking without AD prior to most recent fall and is current using a mix of 2WW and manual chair. Patient hoping to get back to walking without AD. Patient presenting with significant R hip weakness, possible R knee contractures, unsteadiness on feet, and falls risks as indicated by 5xSTS and gait speed. Patient will benefit from skilled physical therapy to help progress towards goal of LRAD and improve safety to reduce risk of future falls.   OBJECTIVE IMPAIRMENTS: Abnormal gait, decreased balance, difficulty walking, decreased ROM, decreased strength, and pain.   ACTIVITY LIMITATIONS: carrying, lifting, transfers, bed mobility, and locomotion level  PARTICIPATION LIMITATIONS: community activity  PERSONAL FACTORS: Age, Time since onset of injury/illness/exacerbation, and 3+ comorbidities: see above  are also affecting patient's functional outcome.   REHAB POTENTIAL: Good  CLINICAL DECISION MAKING: Evolving/moderate complexity  EVALUATION COMPLEXITY: Moderate due to ongoing high fx risk  PLAN:  PT FREQUENCY: 2x/week  PT DURATION: 4 weeks  PLANNED INTERVENTIONS: 97164- PT Re-evaluation,  97110-Therapeutic exercises, 97530- Therapeutic activity, 97112- Neuromuscular re-education, 97535- Self Care, 16109- Manual therapy, and 97116- Gait training  PLAN FOR NEXT SESSION: assess TUG and write LTG, progress towards using 2WW fully, safety with turns with 2WW, work on progressing towards LRAD, basic HEP, balance work   Carmelia Bake, PT, DPT 05/30/2023, 1:54 PM

## 2023-05-30 NOTE — Assessment & Plan Note (Addendum)
 She previously had L tib/fib fracture as well. She was prescribed alendronate in the early 2010s for unclear duration. She restarted alendronate in April 2024 but is now instructed to stop after surgery.  - Cont vit D 1000u qd - Ref to osteoporosis clinic. I am concerned that she now has multiple LE fractures.

## 2023-05-30 NOTE — Progress Notes (Signed)
Amb ref

## 2023-05-30 NOTE — Assessment & Plan Note (Signed)
 HFU for this issue. Fall from standing early march with R proximal femur fracture, s/p intramedullary nailing. Blood loss on chronic anemia required 2u PRBCs. Feels well overall, but still with pain. Can walk with walker, but cannot bear weight. Starting PT today. She is in touch with ortho and will have sutures removed tomorrow. Some bruising and mild LE edema on exam. Ortho did a doppler last week that was negative for DVT. - Per ortho, hold alendronate 6-12 months after surgery - PT to start today - Will give 10 doses Norco 5-325 for use surrounding PT

## 2023-05-30 NOTE — Progress Notes (Signed)
   CC: HFU R femur fx  HPI:  [name] is a 68 y.o. female with a history stated below and presents today for HFU. Please see problem based assessment and plan for additional details.  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 Ernesto Rutherford dermatology in [place].      Diabetes mellitus without complication (HCC)    Endometriosis    Gastroesophageal reflux disease without esophagitis 05/08/2017   GERD (gastroesophageal reflux disease)    Intractable nausea and vomiting 06/13/2020   Liver disease    Malabsorption    MALABSORPTION SYNDROME   Osteoporosis 03/2011   t score -2.5   Pancreatitis    due to cyst and tumors due to calcifications   Seizure (HCC)    no seizure disorder, controlled w/ meds per patient   Vitamin D deficiency 2012   VIT D 15    Review of Systems: ROS negative except for what is noted on the assessment and plan.  Vitals:   05/30/23 0849  BP: 114/70  Pulse: 73  Temp: 98.3 F (36.8 C)  TempSrc: Oral  SpO2: 98%  Height: 5\' 8"  (1.727 m)    Physical Exam: Constitutional: well-appearing woman in wheelchair in no acute distress HENT: normocephalic atraumatic, mucous membranes moist Eyes: conjunctiva non-erythematous Cardiovascular: regular rate and rhythm, no m/r/g Pulmonary/Chest: normal work of breathing on room air, lungs clear to auscultation bilaterally Abdominal: soft, non-tender, non-distended MSK: normal bulk and tone. R leg with trace non-pitting LE edema and mature bruising. No signs of acute swelling or infection. Neurological: alert & oriented x 3, no focal deficit Skin: warm and dry Psych: normal mood and behavior  Assessment & Plan:   Patient discussed with Dr.  Deirdre Priest  Right femoral fracture [facility]) HFU for this issue. Fall from standing early march  with R proximal femur fracture, s/p intramedullary nailing. Blood loss on chronic anemia required 2u PRBCs. Feels well overall, but still with pain. Can walk with walker, but cannot bear weight. Starting PT today. She is in touch with ortho and will have sutures removed tomorrow. Some bruising and mild LE edema on exam. Ortho did a doppler last week that was negative for DVT. - Per ortho, hold alendronate 6-12 months after surgery - PT to start today - Will give 10 doses Norco 5-325 for use surrounding PT  Osteoporosis She previously had L tib/fib fracture as well. She was prescribed alendronate in the early 2010s for unclear duration. She restarted alendronate in April 2024 but is now instructed to stop after surgery.  - Cont vit D 1000u qd - Ref to osteoporosis clinic. I am concerned that she now has multiple LE fractures.  Anemia of chronic disease - CBC today. Per above, required transfusion in hospital.  RTC in April for chronic disease checkup.  Carolyn [name], D.O. [facility] Internal Medicine, PGY-1 Phone: [phone] Date 05/30/2023 Time 11:16 AM

## 2023-05-31 NOTE — Progress Notes (Signed)
 Internal Medicine Clinic Attending  Case discussed with the resident at the time of the visit.  We reviewed the resident's history and exam and pertinent patient test results.  I agree with the assessment, diagnosis, and plan of care documented in the resident's note.

## 2023-06-06 ENCOUNTER — Encounter: Payer: Self-pay | Admitting: Physical Therapy

## 2023-06-06 ENCOUNTER — Ambulatory Visit: Admitting: Physical Therapy

## 2023-06-06 ENCOUNTER — Other Ambulatory Visit: Payer: Self-pay | Admitting: Student

## 2023-06-06 ENCOUNTER — Other Ambulatory Visit (HOSPITAL_BASED_OUTPATIENT_CLINIC_OR_DEPARTMENT_OTHER): Payer: Self-pay

## 2023-06-06 DIAGNOSIS — M25572 Pain in left ankle and joints of left foot: Secondary | ICD-10-CM

## 2023-06-06 DIAGNOSIS — R269 Unspecified abnormalities of gait and mobility: Secondary | ICD-10-CM | POA: Diagnosis not present

## 2023-06-06 DIAGNOSIS — G894 Chronic pain syndrome: Secondary | ICD-10-CM

## 2023-06-06 DIAGNOSIS — M6281 Muscle weakness (generalized): Secondary | ICD-10-CM

## 2023-06-06 DIAGNOSIS — M25551 Pain in right hip: Secondary | ICD-10-CM

## 2023-06-06 DIAGNOSIS — R2681 Unsteadiness on feet: Secondary | ICD-10-CM

## 2023-06-06 DIAGNOSIS — K860 Alcohol-induced chronic pancreatitis: Secondary | ICD-10-CM

## 2023-06-06 MED ORDER — BUPRENORPHINE 20 MCG/HR TD PTWK
1.0000 | MEDICATED_PATCH | TRANSDERMAL | 0 refills | Status: DC
  Filled 2023-06-09: qty 4, 28d supply, fill #0

## 2023-06-06 NOTE — Therapy (Signed)
 OUTPATIENT PHYSICAL THERAPY NEURO TREATMENT    Patient Name: Carolyn [name] MRN: [id number] DOB:20-Feb-1956, 68 y.o., female Today's Date: 06/06/2023   PCP: Faith Rogue, DO REFERRING PROVIDER: Lanae Boast, MD  END OF SESSION:  PT End of Session - 06/06/23 1616     Visit Number 2    Number of Visits 9    Date for PT Re-Evaluation 07/11/23    Authorization Type [employer]    PT Start Time 1613    PT Stop Time 1627    PT Time Calculation (min) 14 min    Equipment Utilized During Treatment Gait belt    Activity Tolerance Treatment limited secondary to medical complications (Comment)   new rash on LE   Behavior During Therapy [facility] for tasks assessed/performed             Past Medical History:  Diagnosis Date   Alcoholic cirrhosis (HCC)    Chronic gastric ulcer with perforation (HCC) 01/19/2022   Chronic pancreatitis (HCC)    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 Ernesto Rutherford dermatology in [place].      Diabetes mellitus without complication (HCC)    Endometriosis    Gastroesophageal reflux disease without esophagitis 05/08/2017   GERD (gastroesophageal reflux disease)    Intractable nausea and vomiting 06/13/2020   Liver disease    Malabsorption    MALABSORPTION SYNDROME   Osteoporosis 03/2011   t score -2.5   Pancreatitis    due to cyst and tumors due to calcifications   Seizure (HCC)    no seizure disorder, controlled w/ meds per patient   Vitamin D deficiency 2012   VIT D 15   Past Surgical History:  Procedure Laterality Date   APPENDECTOMY     BIOPSY  01/19/2022   Procedure: BIOPSY;  Surgeon: Shellia Cleverly, DO;  Location: [facility];  Service: Gastroenterology;;   CATARACT EXTRACTION Right    CHOLECYSTECTOMY     ESOPHAGOGASTRODUODENOSCOPY (EGD) WITH PROPOFOL N/A 01/19/2022   Procedure: ESOPHAGOGASTRODUODENOSCOPY (EGD) WITH PROPOFOL;  Surgeon: Shellia Cleverly, DO;  Location: [facility];  Service: Gastroenterology;   Laterality: N/A;   FEEDING TUBE RELOCATION  2010   FEMUR IM NAIL Right 05/14/2023   Procedure: INTRAMEDULLARY (IM) NAIL FEMORAL, RIGHT HIP;  Surgeon: Samson Frederic, MD;  Location: [facility];  Service: Orthopedics;  Laterality: Right;   JEJUNOSTOMY FEEDING TUBE  1990   MULTIPLE GASTRIC SURGERIES     MYOMECTOMY     OPEN REDUCTION INTERNAL FIXATION (ORIF) DISTAL RADIAL FRACTURE Left 06/29/2018   Procedure: OPEN REDUCTION INTERNAL FIXATION (ORIF)LEFT  DISTAL RADIAL FRACTURE;  Surgeon: Betha Loa, MD;  Location: [facility];  Service: Orthopedics;  Laterality: Left;   ROUX-EN-Y GASTRIC BYPASS     TIBIA IM NAIL INSERTION Left 04/21/2022   Procedure: LEFT INTRAMEDULLARY (IM) NAIL TIBIAL;  Surgeon: Eldred Manges, MD;  Location: [facility];  Service: Orthopedics;  Laterality: Left;   TIBIA IM NAIL INSERTION Left 05/05/2022   Procedure: LEFT TIBIAL NAIL DISTAL SCREW PLACEMENT;  Surgeon: Eldred Manges, MD;  Location: [facility];  Service: Orthopedics;  Laterality: Left;   TUBAL LIGATION     Patient Active Problem List   Diagnosis Date Noted   Orthopedic aftercare for healing traumatic leg fracture, right, closed 05/14/2023   Right femoral fracture (HCC) 05/13/2023   Elevated blood pressure reading 10/04/2022   Healthcare maintenance 09/06/2022   Hypothyroidism 06/23/2022   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   Abnormal loss of weight 03/23/2022  Chronic diastolic heart failure (HCC) 03/23/2022   Anemia of chronic disease 03/17/2022   Alcoholic cirrhosis of liver without ascites (HCC) 01/18/2022   Portal hypertensive gastropathy (HCC) 01/18/2022   Physical deconditioning 01/09/2022   Severe protein-calorie malnutrition (HCC) 01/07/2022   Normocytic anemia 01/07/2022   Osteomyelitis (HCC) 01/06/2022   Seizure (HCC) 05/08/2017   Chronic pain syndrome 05/08/2017   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HCC) 05/08/2017   Osteoporosis 03/16/2011   Malabsorption     ONSET DATE:  05/17/2023 (referral date)  REFERRING DIAG: S72.002A (ICD-10-CM) - Hip fx, left, closed, initial encounter (HCC)  THERAPY DIAG:  Abnormality of gait and mobility  Unsteadiness on feet  Pain in right hip  Pain in left ankle and joints of left foot  Muscle weakness (generalized)  Rationale for Evaluation and Treatment: Rehabilitation  SUBJECTIVE:                                                                                                                                                                                             SUBJECTIVE STATEMENT: Patient denies falls and near falls since last here. Reports lower leg more sore than lower leg. Patient reporting new onset burning and tingling in lower leg. Patient reports overall she is doing well.   Pt accompanied by: self and significant other - Don  PERTINENT HISTORY: multiple falls and fractures, alcoholic cirrhosis, chronic pancreatitis with pancreatic insufficiency, chronic pain syndrome, Hypothyroidism, anemia, thrombocytopenia and osteoporosis  PAIN:  Are you having pain? Yes: NPRS scale: 5/10 Pain location: lower leg Pain description: achy with some sharp Aggravating factors: getting up from sitting position, getting up from activity  Relieving factors: pain medication  PRECAUTIONS: WBAT, seizures in past but been over 20 years since last one, falls risk, osteoporsis   RED FLAGS: None   WEIGHT BEARING RESTRICTIONS: Yes WBAT  FALLS: Has patient fallen in last 6 months? Yes. 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  DIAGNOSTIC FINDINGS:   05/28/2023 Bone Density Scores: Site Region Measured Date Measured Age YA BMD Significant CHANGE T-score Right Forearm Radius 33% 05/28/2023 65.9 -3.6 0.557  g/cm2 AP Spine L1-L4 05/28/2023 65.9 -2.10 0.841 g/cm2  Left Femur Neck 05/28/2023 65.9 -3.0 0.617 g/cm2   05/14/2023 Hip W/O Pelvis: IMPRESSION: ORIF proximal femur fracture without immediate postoperative complication.  COGNITION: Overall cognitive status: Within functional limits for tasks assessed                                                                                                           TREATMENT:    Self Care: Physical therapist briefly assesses RLE as patient reporting new onset burning and tingling in leg on surface level of skin. Ongoing bruising noted from surgery but PT also notes new LE redness with mild rash like appearance. Patient with known history of chicken pox and denies history of shingles. Pt recommends patient go get assessed prior to starting session given evolution of symptoms.   PATIENT EDUCATION: Education details: POC, goal collaboration, examination findings  Person educated: Patient and partner Education method: Explanation Education comprehension: verbalized understanding  HOME EXERCISE PROGRAM: To be provided   GOALS: Goals reviewed with patient? Yes  LONG TERM GOALS: Target date: 07/11/2023 (LTG = STG due to POC length)  Patient will report demonstrate independence with final HEP in order to maintain current gains and continue to progress after physical therapy discharge.   Baseline: To be provided  Goal status: INITIAL  2.  Patient will improve gait speed to 0.56 m/s with LRAD to indicate improvement to the level of community ambulator in order to participate more easily in activities outside of the home.   Baseline:  0.46 m/s with 2WW (SBA) Goal status: INITIAL  3.  Patient will improve their 5x Sit to Stand score to less than 25 seconds to demonstrate a decreased risk for falls and improved LE strength.   Baseline: 29.47 seconds with UE use  Goal status: INITIAL  4. TUG to be assessed and LTG written Baseline: To be assessed  Goal status: INITIAL  ASSESSMENT:  CLINICAL IMPRESSION: Skilled PT session limited by new onset reports of tingling and redness on RLE in area of already present bruising. PT recommends following up with PCP for second opinion given known history of chicken pox and new onset burning and tingling with some redness in LE. Patient in agreement. Continue POC as able.   OBJECTIVE IMPAIRMENTS: Abnormal gait, decreased balance, difficulty walking, decreased ROM, decreased strength, and pain.   ACTIVITY LIMITATIONS: carrying, lifting, transfers, bed mobility, and locomotion level  PARTICIPATION LIMITATIONS: community activity  PERSONAL FACTORS: Age, Time since onset of injury/illness/exacerbation, and 3+ comorbidities: see above  are also affecting patient's functional outcome.   REHAB POTENTIAL: Good  CLINICAL DECISION MAKING: Evolving/moderate complexity  EVALUATION COMPLEXITY: Moderate due to ongoing high fx risk  PLAN:  PT FREQUENCY: 2x/week  PT DURATION: 4 weeks  PLANNED INTERVENTIONS: 97164- PT Re-evaluation, 97110-Therapeutic exercises, 97530- Therapeutic activity, 97112- Neuromuscular re-education, 97535- Self Care, [id number]- Manual therapy, and 97116- Gait training  PLAN FOR NEXT SESSION: assess TUG and write LTG, progress towards using 2WW fully, safety with turns with 2WW, work on progressing towards  LRAD, basic HEP, balance work   Carmelia Bake, PT, DPT 06/06/2023, 4:36 PM

## 2023-06-07 ENCOUNTER — Ambulatory Visit (HOSPITAL_COMMUNITY)
Admission: RE | Admit: 2023-06-07 | Discharge: 2023-06-07 | Disposition: A | Discharge status: Home / Self Care | Source: Ambulatory Visit | Attending: Internal Medicine | Admitting: Internal Medicine

## 2023-06-07 DIAGNOSIS — K7030 Alcoholic cirrhosis of liver without ascites: Secondary | ICD-10-CM | POA: Insufficient documentation

## 2023-06-09 ENCOUNTER — Other Ambulatory Visit (HOSPITAL_BASED_OUTPATIENT_CLINIC_OR_DEPARTMENT_OTHER): Payer: Self-pay

## 2023-06-09 ENCOUNTER — Encounter: Payer: Self-pay | Admitting: Physical Therapy

## 2023-06-09 ENCOUNTER — Ambulatory Visit: Admitting: Physical Therapy

## 2023-06-09 VITALS — BP 130/74 | HR 104

## 2023-06-09 DIAGNOSIS — M25551 Pain in right hip: Secondary | ICD-10-CM

## 2023-06-09 DIAGNOSIS — R2681 Unsteadiness on feet: Secondary | ICD-10-CM

## 2023-06-09 DIAGNOSIS — R269 Unspecified abnormalities of gait and mobility: Secondary | ICD-10-CM | POA: Diagnosis not present

## 2023-06-09 NOTE — Therapy (Unsigned)
 OUTPATIENT PHYSICAL THERAPY NEURO TREATMENT    Patient Name: Carolyn [name] MRN: [id number] DOB:1955-07-17, 68 y.o., female Today's Date: 06/10/2023   PCP: Faith Rogue, DO REFERRING PROVIDER: Lanae Boast, MD  END OF SESSION:  PT End of Session - 06/09/23 1613     Visit Number 3    Number of Visits 9    Date for PT Re-Evaluation 07/11/23    Authorization Type [employer]    PT Start Time 1610    PT Stop Time 1658    PT Time Calculation (min) 48 min    Equipment Utilized During Treatment Gait belt    Activity Tolerance Treatment limited secondary to medical complications (Comment)    Behavior During Therapy [facility] for tasks assessed/performed             Past Medical History:  Diagnosis Date   Alcoholic cirrhosis (HCC)    Chronic gastric ulcer with perforation (HCC) 01/19/2022   Chronic pancreatitis (HCC)    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 Ernesto Rutherford dermatology in [place].      Diabetes mellitus without complication (HCC)    Endometriosis    Gastroesophageal reflux disease without esophagitis 05/08/2017   GERD (gastroesophageal reflux disease)    Intractable nausea and vomiting 06/13/2020   Liver disease    Malabsorption    MALABSORPTION SYNDROME   Osteoporosis 03/2011   t score -2.5   Pancreatitis    due to cyst and tumors due to calcifications   Seizure (HCC)    no seizure disorder, controlled w/ meds per patient   Vitamin D deficiency 2012   VIT D 15   Past Surgical History:  Procedure Laterality Date   APPENDECTOMY     BIOPSY  01/19/2022   Procedure: BIOPSY;  Surgeon: Shellia Cleverly, DO;  [facility];  Service: Gastroenterology;;   CATARACT EXTRACTION Right    CHOLECYSTECTOMY     ESOPHAGOGASTRODUODENOSCOPY (EGD) WITH PROPOFOL N/A 01/19/2022   Procedure: ESOPHAGOGASTRODUODENOSCOPY (EGD) WITH PROPOFOL;  Surgeon: Shellia Cleverly, DO;  [facility];  Service: Gastroenterology;  Laterality: N/A;    FEEDING TUBE RELOCATION  2010   FEMUR IM NAIL Right 05/14/2023   Procedure: INTRAMEDULLARY (IM) NAIL FEMORAL, RIGHT HIP;  Surgeon: Samson Frederic, MD;  Location: [facility];  Service: Orthopedics;  Laterality: Right;   JEJUNOSTOMY FEEDING TUBE  1990   MULTIPLE GASTRIC SURGERIES     MYOMECTOMY     OPEN REDUCTION INTERNAL FIXATION (ORIF) DISTAL RADIAL FRACTURE Left 06/29/2018   Procedure: OPEN REDUCTION INTERNAL FIXATION (ORIF)LEFT  DISTAL RADIAL FRACTURE;  Surgeon: Betha Loa, MD;  Location: [facility];  Service: Orthopedics;  Laterality: Left;   ROUX-EN-Y GASTRIC BYPASS     TIBIA IM NAIL INSERTION Left 04/21/2022   Procedure: LEFT INTRAMEDULLARY (IM) NAIL TIBIAL;  Surgeon: Eldred Manges, MD;  Location: [facility];  Service: Orthopedics;  Laterality: Left;   TIBIA IM NAIL INSERTION Left 05/05/2022   Procedure: LEFT TIBIAL NAIL DISTAL SCREW PLACEMENT;  Surgeon: Eldred Manges, MD;  Location: [facility];  Service: Orthopedics;  Laterality: Left;   TUBAL LIGATION     Patient Active Problem List   Diagnosis Date Noted   Orthopedic aftercare for healing traumatic leg fracture, right, closed 05/14/2023   Right femoral fracture (HCC) 05/13/2023   Elevated blood pressure reading 10/04/2022   Healthcare maintenance 09/06/2022   Hypothyroidism 06/23/2022   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   Abnormal loss of weight 03/23/2022   Chronic diastolic heart failure (  HCC) 03/23/2022   Anemia of chronic disease 03/17/2022   Alcoholic cirrhosis of liver without ascites (HCC) 01/18/2022   Portal hypertensive gastropathy (HCC) 01/18/2022   Physical deconditioning 01/09/2022   Severe protein-calorie malnutrition (HCC) 01/07/2022   Normocytic anemia 01/07/2022   Osteomyelitis (HCC) 01/06/2022   Seizure (HCC) 05/08/2017   Chronic pain syndrome 05/08/2017   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HCC) 05/08/2017   Osteoporosis 03/16/2011   Malabsorption     ONSET DATE: 05/17/2023 (referral  date)  REFERRING DIAG: S72.002A (ICD-10-CM) - Hip fx, left, closed, initial encounter (HCC)  THERAPY DIAG:  Abnormality of gait and mobility  Unsteadiness on feet  Pain in right hip  Rationale for Evaluation and Treatment: Rehabilitation  SUBJECTIVE:                                                                                                                                                                                             SUBJECTIVE STATEMENT: Patient arrives to session with 2WW. Reports that she is doing well. She did follow up about rash on LE and shingles ruled out. Provided gabapentin and slept through night for first time. Reports that she still has had some calf burning and stinging but overall improved.   Pt accompanied by: self and significant other - Don  PERTINENT HISTORY: multiple falls and fractures, alcoholic cirrhosis, chronic pancreatitis with pancreatic insufficiency, chronic pain syndrome, Hypothyroidism, anemia, thrombocytopenia and osteoporosis  PAIN:  Are you having pain? Yes: NPRS scale: 3/10 Pain location: lower leg Pain description: achy with some sharp Aggravating factors: getting up from sitting position, getting up from activity  Relieving factors: pain medication  PRECAUTIONS: WBAT, seizures in past but been over 20 years since last one, falls risk, osteoporsis   RED FLAGS: None   WEIGHT BEARING RESTRICTIONS: Yes WBAT  FALLS: Has patient fallen in last 6 months? Yes. 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  DIAGNOSTIC FINDINGS:   05/28/2023 Bone Density Scores: Site Region Measured Date Measured Age YA BMD Significant CHANGE T-score Right Forearm Radius 33% 05/28/2023 65.9 -3.6 0.557 g/cm2 AP Spine L1-L4 05/28/2023  65.9 -2.8 0.841 g/cm2  Left Femur Neck 05/28/2023 65.9 -3.0 0.617 g/cm2   05/14/2023 Hip W/O Pelvis: IMPRESSION: ORIF proximal femur fracture without immediate postoperative complication.  COGNITION: Overall cognitive status: Within functional limits for tasks assessed                                                                                                           TREATMENT:    Vitals:   06/09/23 1618  BP: 130/74  Pulse: (!) 104    TherEx: - Sit to Stand Without Arm Support - 3 sets - 10 reps - Standing Marching - 3 sets - 12 reps - Semi-Tandem Corner Balance With Eyes Closed - 3 sets - 30 seconds hold  Gait: Gait in // bars: Forward walking in // bars - 4 x 10 feet with single UE support trial on L and R, improved with L arm (SBA) Lateral stepping in // bars - 4 x 10 feet with lateral stepping in // bars with single UE support (SBA)   Gait pattern:  decreased stride on LLE, initially toe walking due to pain on that side and then able to archieve some heel contact, rotation at tibia from old fx on R   Distance walked: various clinic distances with 2WW, 1 x 115' with SPC and 1 x 23' with SPC Assistive device utilized: Environmental consultant - 4 wheeled and then [facility] with rubber quad tip Level of assistance: modI with walker and CGA-SBA with SPC Comments: With use of SPC used tripod walking strategy with cane in left hand, patient appropriate and safe over level surface with CGA-SBA  Recommend SPC trial at home with spouse supervison in house along counter with dogs out of house  PATIENT EDUCATION: Education details: POC, goal collaboration, examination findings   Person educated: Patient and partner Education method: Explanation Education comprehension: verbalized understanding  HOME EXERCISE PROGRAM: Access Code: [email or web address] URL: [email or web address] Date: 06/09/2023 Prepared by: Maryruth Eve  Exercises - Sit to Stand Without Arm Support  - 1 x daily - 7 x weekly - 3 sets - 10 reps - Standing Marching  - 1 x daily - 7 x weekly - 3 sets - 12 reps - Semi-Tandem Corner Balance With Eyes Closed  - 1 x daily - 7 x weekly - 3 sets - 30 seconds hold  Recommend SPC trial at home with spouse supervison in house along counter with dogs out of house x 10 minutes   GOALS: Goals reviewed with patient? Yes  LONG TERM GOALS: Target date: 07/11/2023 (LTG = STG due to POC length)  Patient will report demonstrate independence with final HEP in order to maintain current gains and continue to progress after physical therapy discharge.   Baseline: To be provided  Goal status: INITIAL  2.  Patient will improve gait speed to 0.56 m/s with LRAD to indicate improvement to the level of community ambulator in order to participate more easily in activities outside of the home.   Baseline: 0.46 m/s with 2WW (SBA) Goal status: INITIAL  3.  Patient  will improve their 5x Sit to Stand score to less than 25 seconds to demonstrate a decreased risk for falls and improved LE strength.   Baseline: 29.47 seconds with UE use  Goal status: INITIAL  4. TUG to be assessed and LTG written Baseline: To be assessed  Goal status: INITIAL  ASSESSMENT:  CLINICAL IMPRESSION: Skilled PT session emphasized work on gait training progression from 2WW to [facility] with rubber tip. Patient safe for trial of cane as practice in home with supervision along counter but recommend otherwise use of 2WW at this time. Also introduced basic HEP for strengthening. Continue POC as able.   OBJECTIVE IMPAIRMENTS: Abnormal gait, decreased balance, difficulty walking, decreased ROM, decreased  strength, and pain.   ACTIVITY LIMITATIONS: carrying, lifting, transfers, bed mobility, and locomotion level  PARTICIPATION LIMITATIONS: community activity  PERSONAL FACTORS: Age, Time since onset of injury/illness/exacerbation, and 3+ comorbidities: see above  are also affecting patient's functional outcome.   REHAB POTENTIAL: Good  CLINICAL DECISION MAKING: Evolving/moderate complexity  EVALUATION COMPLEXITY: Moderate due to ongoing high fx risk  PLAN:  PT FREQUENCY: 2x/week  PT DURATION: 4 weeks  PLANNED INTERVENTIONS: 97164- PT Re-evaluation, 97110-Therapeutic exercises, 97530- Therapeutic activity, 97112- Neuromuscular re-education, 97535- Self Care, 16109- Manual therapy, and 97116- Gait training  PLAN FOR NEXT SESSION: assess TUG and write LTG, safety with turns with 2WW, work on progressing towards LRAD, basic HEP, balance work, continue to progress gait with SPC and extensive LE strength and balance work    Carmelia Bake, PT, DPT 06/10/2023, 7:57 AM

## 2023-06-10 ENCOUNTER — Encounter: Payer: Self-pay | Admitting: Physical Therapy

## 2023-06-13 ENCOUNTER — Ambulatory Visit: Admitting: Physical Therapy

## 2023-06-13 ENCOUNTER — Telehealth: Payer: Self-pay | Admitting: Physical Therapy

## 2023-06-13 VITALS — BP 115/73 | HR 113

## 2023-06-13 DIAGNOSIS — R269 Unspecified abnormalities of gait and mobility: Secondary | ICD-10-CM | POA: Diagnosis not present

## 2023-06-13 DIAGNOSIS — M25551 Pain in right hip: Secondary | ICD-10-CM

## 2023-06-13 DIAGNOSIS — R2681 Unsteadiness on feet: Secondary | ICD-10-CM

## 2023-06-13 NOTE — Telephone Encounter (Signed)
 Good afternoon Dr. Hessie Diener,  Carolyn [name] arrived to PT today with resting HR bouncing between 100-112 bpm. Patient HR was elevated to 104 bpm last session but per chart review and other previous medical care episodes HR for patient is typically in 70s. Given such change from baseline for patient and cardiac medical scare last year, we advised follow up with you. If you could follow up as well, that would be greatly appreciated.  Thank you, Maryruth Eve, PT, DPT

## 2023-06-13 NOTE — Therapy (Signed)
 OUTPATIENT PHYSICAL THERAPY NEURO TREATMENT    Patient Name: Carolyn [name] MRN: [id number] DOB:May 02, 1955, 68 y.o., female Today's Date: 06/13/2023   PCP: Faith Rogue, DO REFERRING PROVIDER: Lanae Boast, MD  END OF SESSION:  PT End of Session - 06/13/23 1622     Visit Number 4    Number of Visits 9    Date for PT Re-Evaluation 07/11/23    Authorization Type [employer]    PT Start Time 1620    PT Stop Time 1647    PT Time Calculation (min) 27 min    Equipment Utilized During Treatment Gait belt    Activity Tolerance Treatment limited secondary to medical complications (Comment)    Behavior During Therapy [facility] for tasks assessed/performed             Past Medical History:  Diagnosis Date   Alcoholic cirrhosis (HCC)    Chronic gastric ulcer with perforation (HCC) 01/19/2022   Chronic pancreatitis (HCC)    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 Ernesto Rutherford dermatology in [place].      Diabetes mellitus without complication (HCC)    Endometriosis    Gastroesophageal reflux disease without esophagitis 05/08/2017   GERD (gastroesophageal reflux disease)    Intractable nausea and vomiting 06/13/2020   Liver disease    Malabsorption    MALABSORPTION SYNDROME   Osteoporosis 03/2011   t score -2.5   Pancreatitis    due to cyst and tumors due to calcifications   Seizure (HCC)    no seizure disorder, controlled w/ meds per patient   Vitamin D deficiency 2012   VIT D 15   Past Surgical History:  Procedure Laterality Date   APPENDECTOMY     BIOPSY  01/19/2022   Procedure: BIOPSY;  Surgeon: Shellia Cleverly, DO;  [facility]: [facility];  Service: Gastroenterology;;   CATARACT EXTRACTION Right    CHOLECYSTECTOMY     ESOPHAGOGASTRODUODENOSCOPY (EGD) WITH PROPOFOL N/A 01/19/2022   Procedure: ESOPHAGOGASTRODUODENOSCOPY (EGD) WITH PROPOFOL;  Surgeon: Shellia Cleverly, DO;  [facility];  Service: Gastroenterology;  Laterality: N/A;    FEEDING TUBE RELOCATION  2010   FEMUR IM NAIL Right 05/14/2023   Procedure: INTRAMEDULLARY (IM) NAIL FEMORAL, RIGHT HIP;  Surgeon: Samson Frederic, MD;  Location: [facility];  Service: Orthopedics;  Laterality: Right;   JEJUNOSTOMY FEEDING TUBE  1990   MULTIPLE GASTRIC SURGERIES     MYOMECTOMY     OPEN REDUCTION INTERNAL FIXATION (ORIF) DISTAL RADIAL FRACTURE Left 06/29/2018   Procedure: OPEN REDUCTION INTERNAL FIXATION (ORIF)LEFT  DISTAL RADIAL FRACTURE;  Surgeon: Betha Loa, MD;  Location: [facility];  Service: Orthopedics;  Laterality: Left;   ROUX-EN-Y GASTRIC BYPASS     TIBIA IM NAIL INSERTION Left 04/21/2022   Procedure: LEFT INTRAMEDULLARY (IM) NAIL TIBIAL;  Surgeon: Eldred Manges, MD;  [facility];  Service: Orthopedics;  Laterality: Left;   TIBIA IM NAIL INSERTION Left 05/05/2022   Procedure: LEFT TIBIAL NAIL DISTAL SCREW PLACEMENT;  Surgeon: Eldred Manges, MD;  [facility]: [facility];  Service: Orthopedics;  Laterality: Left;   TUBAL LIGATION     Patient Active Problem List   Diagnosis Date Noted   Orthopedic aftercare for healing traumatic leg fracture, right, closed 05/14/2023   Right femoral fracture (HCC) 05/13/2023   Elevated blood pressure reading 10/04/2022   Healthcare maintenance 09/06/2022   Hypothyroidism 06/23/2022   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   Abnormal loss of weight 03/23/2022   Chronic diastolic heart failure (  HCC) 03/23/2022   Anemia of chronic disease 03/17/2022   Alcoholic cirrhosis of liver without ascites (HCC) 01/18/2022   Portal hypertensive gastropathy (HCC) 01/18/2022   Physical deconditioning 01/09/2022   Severe protein-calorie malnutrition (HCC) 01/07/2022   Normocytic anemia 01/07/2022   Osteomyelitis (HCC) 01/06/2022   Seizure (HCC) 05/08/2017   Chronic pain syndrome 05/08/2017   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HCC) 05/08/2017   Osteoporosis 03/16/2011   Malabsorption     ONSET DATE: 05/17/2023 (referral  date)  REFERRING DIAG: S72.002A (ICD-10-CM) - Hip fx, left, closed, initial encounter (HCC)  THERAPY DIAG:  Abnormality of gait and mobility  Unsteadiness on feet  Pain in right hip  Rationale for Evaluation and Treatment: Rehabilitation  SUBJECTIVE:                                                                                                                                                                                             SUBJECTIVE STATEMENT: Patient arrives to session with SPC with quad tip. Denies falls and near falls. Patient report that she wants to work on strengthening her legs more. Patient reports her leg has still been burning but heat has been helpful. Patient reports homework has been going well.   Pt accompanied by: self and significant other - Don  PERTINENT HISTORY: multiple falls and fractures, alcoholic cirrhosis, chronic pancreatitis with pancreatic insufficiency, chronic pain syndrome, Hypothyroidism, anemia, thrombocytopenia and osteoporosis  PAIN:  Are you having pain? Yes: NPRS scale: 4/10 Pain location: lower leg - calf Pain description: achy with some sharp Aggravating factors: getting up from sitting position, getting up from activity  Relieving factors: pain medication  PRECAUTIONS: WBAT, seizures in past but been over 20 years since last one, falls risk, osteoporsis   RED FLAGS: None   WEIGHT BEARING RESTRICTIONS: Yes WBAT  FALLS: Has patient fallen in last 6 months? Yes. 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  DIAGNOSTIC FINDINGS:   05/28/2023 Bone Density Scores: Site Region Measured Date Measured Age YA BMD Significant CHANGE T-score Right Forearm Radius 33% 05/28/2023 65.9 -3.6 0.557 g/cm2 AP Spine L1-L4  05/28/2023 65.9 -2.8 0.841 g/cm2  Left Femur Neck 05/28/2023 65.9 -3.0 0.617 g/cm2   05/14/2023 Hip W/O Pelvis: IMPRESSION: ORIF proximal femur fracture without immediate postoperative complication.  COGNITION: Overall cognitive status: Within functional limits for tasks assessed                                                                                                           TREATMENT:    Vitals:   06/13/23 1629  BP: 115/73  Pulse: (!) 113    Self Care: Patient resting HR elevated last 2 session bouncing between 100-113 bpm; given elevated readings without cause and averaging 20-30 bpm higher than average, PT strongly advises patient follow up with PCP, updates spouse too and provides education   PATIENT EDUCATION: Education details: HR safety  Person educated: Patient and partner Education method: Explanation Education comprehension: verbalized understanding  HOME EXERCISE PROGRAM: Access Code: [email or web address] URL: [email or web address] Date: 06/09/2023 Prepared by: Maryruth Eve  Exercises - Sit to Stand Without Arm Support  - 1 x daily - 7 x weekly - 3 sets - 10 reps - Standing Marching  - 1 x daily - 7 x weekly - 3 sets - 12 reps - Semi-Tandem Corner Balance With Eyes Closed  - 1 x daily - 7 x weekly - 3 sets - 30 seconds hold  Recommend SPC trial at home with spouse supervison in house along counter with dogs out of house x 10 minutes   GOALS: Goals reviewed with patient? Yes  LONG TERM GOALS: Target date: 07/11/2023 (LTG = STG due to POC length)  Patient will report demonstrate independence with final HEP in order to maintain current  gains and continue to progress after physical therapy discharge.   Baseline: To be provided  Goal status: INITIAL  2.  Patient will improve gait speed to 0.56 m/s with LRAD to indicate improvement to the level of community ambulator in order to participate more easily in activities outside of the home.   Baseline: 0.46 m/s with 2WW (SBA) Goal status: INITIAL  3.  Patient will improve their 5x Sit to Stand score to less than 25 seconds to demonstrate a decreased risk for falls and improved LE strength.   Baseline: 29.47 seconds with UE use  Goal status: INITIAL  4. TUG to be assessed and LTG written Baseline: To be assessed  Goal status: INITIAL  ASSESSMENT:  CLINICAL IMPRESSION: Skilled PT session limited due to patient with elevated HR readings. Patient HR readings 100-112 at rest at baseline. Given 20-30 beats above baseline; PT advises follow up with PCP. Patient with history of HF when in emergency room about a year ago but has not had major cardiology workup since then. Continue POC as able.   OBJECTIVE IMPAIRMENTS: Abnormal gait, decreased balance, difficulty walking, decreased ROM, decreased strength, and pain.   ACTIVITY LIMITATIONS: carrying, lifting, transfers, bed mobility, and locomotion level  PARTICIPATION LIMITATIONS: community activity  PERSONAL FACTORS: Age, Time since onset of injury/illness/exacerbation, and 3+ comorbidities: see above  are also affecting patient's functional outcome.  REHAB POTENTIAL: Good  CLINICAL DECISION MAKING: Evolving/moderate complexity  EVALUATION COMPLEXITY: Moderate due to ongoing high fx risk  PLAN:  PT FREQUENCY: 2x/week  PT DURATION: 4 weeks  PLANNED INTERVENTIONS: 97164- PT Re-evaluation, 97110-Therapeutic exercises, 97530- Therapeutic activity, 97112- Neuromuscular re-education, 97535- Self Care, 16109- Manual therapy, and 97116- Gait training  PLAN FOR NEXT SESSION: assess TUG and write LTG, safety with turns with 2WW,  work on progressing towards LRAD, basic HEP, balance work, continue to progress gait with SPC and extensive LE strength and balance work   Where is HR in today's session    Carmelia Bake, PT, DPT 06/13/2023, 4:58 PM

## 2023-06-14 NOTE — Progress Notes (Signed)
 I called pt but no answer; left message of office's call. This message dated 06/06/23; front office received message today. Per chart pt has been going to PT.

## 2023-06-16 ENCOUNTER — Encounter: Payer: Self-pay | Admitting: Physical Therapy

## 2023-06-16 ENCOUNTER — Ambulatory Visit: Attending: Internal Medicine | Admitting: Physical Therapy

## 2023-06-16 VITALS — BP 139/78 | HR 108

## 2023-06-16 DIAGNOSIS — R2681 Unsteadiness on feet: Secondary | ICD-10-CM | POA: Diagnosis present

## 2023-06-16 DIAGNOSIS — R269 Unspecified abnormalities of gait and mobility: Secondary | ICD-10-CM | POA: Diagnosis present

## 2023-06-16 DIAGNOSIS — M25551 Pain in right hip: Secondary | ICD-10-CM | POA: Diagnosis present

## 2023-06-16 NOTE — Therapy (Unsigned)
 OUTPATIENT PHYSICAL THERAPY NEURO TREATMENT    Patient Name: DANNAE KATO MRN: [id number] DOB:1955-04-05, 68 y.o., female Today's Date: 06/17/2023   PCP: Faith Rogue, DO REFERRING PROVIDER: Lanae Boast, MD  END OF SESSION:  PT End of Session - 06/16/23 1624     Visit Number 5    Number of Visits 9    Date for PT Re-Evaluation 07/11/23    Authorization Type [employer]    PT Start Time 1623    PT Stop Time 1640    PT Time Calculation (min) 17 min    Equipment Utilized During Treatment Gait belt    Activity Tolerance Treatment limited secondary to medical complications (Comment)    Behavior During Therapy [facility] for tasks assessed/performed             Past Medical History:  Diagnosis Date   Alcoholic cirrhosis (HCC)    Chronic gastric ulcer with perforation (HCC) 01/19/2022   Chronic pancreatitis (HCC)    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 Ernesto Rutherford dermatology in [place].      Diabetes mellitus without complication (HCC)    Endometriosis    Gastroesophageal reflux disease without esophagitis 05/08/2017   GERD (gastroesophageal reflux disease)    Intractable nausea and vomiting 06/13/2020   Liver disease    Malabsorption    MALABSORPTION SYNDROME   Osteoporosis 03/2011   t score -2.5   Pancreatitis    due to cyst and tumors due to calcifications   Seizure (HCC)    no seizure disorder, controlled w/ meds per patient   Vitamin D deficiency 2012   VIT D 15   Past Surgical History:  Procedure Laterality Date   APPENDECTOMY     BIOPSY  01/19/2022   Procedure: BIOPSY;  Surgeon: Shellia Cleverly, DO;  [facility];  Service: Gastroenterology;;   CATARACT EXTRACTION Right    CHOLECYSTECTOMY     ESOPHAGOGASTRODUODENOSCOPY (EGD) WITH PROPOFOL N/A 01/19/2022   Procedure: ESOPHAGOGASTRODUODENOSCOPY (EGD) WITH PROPOFOL;  Surgeon: Shellia Cleverly, DO;  [facility];  Service: Gastroenterology;  Laterality: N/A;    FEEDING TUBE RELOCATION  2010   FEMUR IM NAIL Right 05/14/2023   Procedure: INTRAMEDULLARY (IM) NAIL FEMORAL, RIGHT HIP;  Surgeon: Samson Frederic, MD;  [facility];  Service: Orthopedics;  Laterality: Right;   JEJUNOSTOMY FEEDING TUBE  1990   MULTIPLE GASTRIC SURGERIES     MYOMECTOMY     OPEN REDUCTION INTERNAL FIXATION (ORIF) DISTAL RADIAL FRACTURE Left 06/29/2018   Procedure: OPEN REDUCTION INTERNAL FIXATION (ORIF)LEFT  DISTAL RADIAL FRACTURE;  Surgeon: Betha Loa, MD;  Location: [facility];  Service: Orthopedics;  Laterality: Left;   ROUX-EN-Y GASTRIC BYPASS     TIBIA IM NAIL INSERTION Left 04/21/2022   Procedure: LEFT INTRAMEDULLARY (IM) NAIL TIBIAL;  Surgeon: Eldred Manges, MD;  [facility];  Service: Orthopedics;  Laterality: Left;   TIBIA IM NAIL INSERTION Left 05/05/2022   Procedure: LEFT TIBIAL NAIL DISTAL SCREW PLACEMENT;  Surgeon: Eldred Manges, MD;  [facility];  Service: Orthopedics;  Laterality: Left;   TUBAL LIGATION     Patient Active Problem List   Diagnosis Date Noted   Orthopedic aftercare for healing traumatic leg fracture, right, closed 05/14/2023   Right femoral fracture (HCC) 05/13/2023   Elevated blood pressure reading 10/04/2022   Healthcare maintenance 09/06/2022   Hypothyroidism 06/23/2022   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   Abnormal loss of weight 03/23/2022   Chronic diastolic heart failure (  HCC) 03/23/2022   Anemia of chronic disease 03/17/2022   Alcoholic cirrhosis of liver without ascites (HCC) 01/18/2022   Portal hypertensive gastropathy (HCC) 01/18/2022   Physical deconditioning 01/09/2022   Severe protein-calorie malnutrition (HCC) 01/07/2022   Normocytic anemia 01/07/2022   Osteomyelitis (HCC) 01/06/2022   Seizure (HCC) 05/08/2017   Chronic pain syndrome 05/08/2017   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HCC) 05/08/2017   Osteoporosis 03/16/2011   Malabsorption     ONSET DATE: 05/17/2023 (referral  date)  REFERRING DIAG: S72.002A (ICD-10-CM) - Hip fx, left, closed, initial encounter (HCC)  THERAPY DIAG:  Unsteadiness on feet  Pain in right hip  Abnormality of gait and mobility  Rationale for Evaluation and Treatment: Rehabilitation  SUBJECTIVE:                                                                                                                                                                                             SUBJECTIVE STATEMENT: Patient arrives to session with 2WW and SPC. Patient denies falls and near falls. Reports waiting to hear back from internal medicine about HR and spouse later states they have been monitoring at home but have readings in 80s.   Pt accompanied by: self and significant other - Don  PERTINENT HISTORY: multiple falls and fractures, alcoholic cirrhosis, chronic pancreatitis with pancreatic insufficiency, chronic pain syndrome, Hypothyroidism, anemia, thrombocytopenia and osteoporosis  PAIN:  Are you having pain? Yes: NPRS scale: 3/10 Pain location: lower leg - calf Pain description: achy with some sharp Aggravating factors: getting up from sitting position, getting up from activity  Relieving factors: pain medication  PRECAUTIONS: WBAT, seizures in past but been over 20 years since last one, falls risk, osteoporsis   RED FLAGS: None   WEIGHT BEARING RESTRICTIONS: Yes WBAT  FALLS: Has patient fallen in last 6 months? Yes. 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  DIAGNOSTIC FINDINGS:   05/28/2023 Bone Density Scores: Site Region Measured Date Measured Age YA BMD Significant CHANGE T-score Right Forearm Radius 33% 05/28/2023 65.9 -3.6 0.557 g/cm2 AP Spine L1-L4 05/28/2023 65.9 -2.8 0.841 g/cm2  Left  Femur Neck 05/28/2023 65.9 -3.0 0.617 g/cm2   05/14/2023 Hip W/O Pelvis: IMPRESSION: ORIF proximal femur fracture without immediate postoperative complication.  COGNITION: Overall cognitive status: Within functional limits for tasks assessed                                                                                                           TREATMENT:    Vitals:   06/16/23 1632  BP: 139/78  Pulse: (!) 108  Pulse: jumping between 101-113 seated during session  Self Care: Patient resting HR elevated last 3 session bouncing between 100-113 bpm; given elevated readings without cause and averaging 20-30 bpm higher than average again from prior sessions, PT strongly advises patient follow up with PCP, updates spouse too and recommends medical hold until cleared given deviation from baseline   PATIENT EDUCATION: Education details: HR safety  Person educated: Patient and partner Education method: Explanation Education comprehension: verbalized understanding  HOME EXERCISE PROGRAM: Access Code: [email or web address] URL: [email or web address] Date: 06/09/2023 Prepared by: Maryruth Eve  Exercises - Sit to Stand Without Arm Support  - 1 x daily - 7 x weekly - 3 sets - 10 reps - Standing Marching  - 1 x daily - 7 x weekly - 3 sets - 12 reps - Semi-Tandem Corner Balance With Eyes Closed  - 1 x daily - 7 x weekly - 3 sets - 30 seconds hold  Recommend SPC trial at home with spouse supervison in house along counter with dogs out of house x 10 minutes   GOALS: Goals reviewed with patient? Yes  LONG TERM GOALS: Target date: 07/11/2023 (LTG = STG due to POC  length)  Patient will report demonstrate independence with final HEP in order to maintain current gains and continue to progress after physical therapy discharge.   Baseline: To be provided  Goal status: INITIAL  2.  Patient will improve gait speed to 0.56 m/s with LRAD to indicate improvement to the level of community ambulator in order to participate more easily in activities outside of the home.   Baseline: 0.46 m/s with 2WW (SBA) Goal status: INITIAL  3.  Patient will improve their 5x Sit to Stand score to less than 25 seconds to demonstrate a decreased risk for falls and improved LE strength.   Baseline: 29.47 seconds with UE use  Goal status: INITIAL  4. TUG to be assessed and LTG written Baseline: To be assessed  Goal status: INITIAL  ASSESSMENT:  CLINICAL IMPRESSION: Skilled PT session remains limited due to ongoing elevated resting HR. PT advises medical hold until further medical workup given persistent elevated HR without known cause. Patient and spouse verbalize understanding. Continue POC as able.   OBJECTIVE IMPAIRMENTS: Abnormal gait, decreased balance, difficulty walking, decreased ROM, decreased strength, and pain.   ACTIVITY LIMITATIONS: carrying, lifting, transfers, bed mobility, and locomotion level  PARTICIPATION LIMITATIONS: community activity  PERSONAL FACTORS: Age, Time since onset of injury/illness/exacerbation, and 3+ comorbidities: see above  are also affecting patient's functional outcome.   REHAB POTENTIAL: Good  CLINICAL DECISION MAKING: Evolving/moderate complexity  EVALUATION COMPLEXITY: Moderate due to ongoing high fx risk  PLAN:  PT FREQUENCY: 2x/week  PT DURATION: 4 weeks  PLANNED INTERVENTIONS: 97164- PT Re-evaluation, 97110-Therapeutic exercises, 97530- Therapeutic activity, 97112- Neuromuscular re-education, 97535- Self Care, 16109- Manual therapy, and 97116- Gait training  PLAN FOR NEXT SESSION: assess TUG and write LTG, safety with  turns with 2WW, work on progressing towards LRAD, basic HEP, balance work, continue to progress gait with SPC and extensive LE strength and balance work   Where is HR in today's session and any further medical workup    Carmelia Bake, PT, DPT 06/17/2023, 7:44 AM

## 2023-06-20 ENCOUNTER — Ambulatory Visit: Admitting: Physical Therapy

## 2023-06-21 NOTE — Telephone Encounter (Signed)
 I called pt no answer, LVM to call the clinic back.

## 2023-06-23 ENCOUNTER — Ambulatory Visit: Admitting: Physical Therapy

## 2023-06-23 ENCOUNTER — Encounter: Payer: Self-pay | Admitting: Student

## 2023-06-23 NOTE — Telephone Encounter (Signed)
 A letter is being mailed to the patient to her to f/u with her imaging.  Copied from CRM [id number]. Topic: General - Call Back - No Documentation >> Jun 22, 2023  5:09 PM Corin V wrote: Reason for CRM: Patient stated she missed a call a few minutes ago, but no documentation found in chart.

## 2023-06-27 ENCOUNTER — Ambulatory Visit: Admitting: Physical Therapy

## 2023-06-28 ENCOUNTER — Other Ambulatory Visit (HOSPITAL_BASED_OUTPATIENT_CLINIC_OR_DEPARTMENT_OTHER): Payer: Self-pay

## 2023-06-29 ENCOUNTER — Ambulatory Visit: Admitting: Physical Therapy

## 2023-06-30 ENCOUNTER — Ambulatory Visit: Admitting: Physical Therapy

## 2023-07-05 ENCOUNTER — Other Ambulatory Visit: Payer: Self-pay | Admitting: Student

## 2023-07-05 ENCOUNTER — Other Ambulatory Visit (HOSPITAL_BASED_OUTPATIENT_CLINIC_OR_DEPARTMENT_OTHER): Payer: Self-pay

## 2023-07-05 ENCOUNTER — Other Ambulatory Visit: Payer: Self-pay

## 2023-07-05 ENCOUNTER — Other Ambulatory Visit (HOSPITAL_COMMUNITY): Payer: Self-pay

## 2023-07-05 DIAGNOSIS — G894 Chronic pain syndrome: Secondary | ICD-10-CM

## 2023-07-05 DIAGNOSIS — K860 Alcohol-induced chronic pancreatitis: Secondary | ICD-10-CM

## 2023-07-06 ENCOUNTER — Other Ambulatory Visit (HOSPITAL_BASED_OUTPATIENT_CLINIC_OR_DEPARTMENT_OTHER): Payer: Self-pay

## 2023-07-06 ENCOUNTER — Other Ambulatory Visit: Payer: Self-pay | Admitting: Student

## 2023-07-06 ENCOUNTER — Other Ambulatory Visit: Payer: Self-pay

## 2023-07-06 DIAGNOSIS — K860 Alcohol-induced chronic pancreatitis: Secondary | ICD-10-CM

## 2023-07-06 DIAGNOSIS — G894 Chronic pain syndrome: Secondary | ICD-10-CM

## 2023-07-06 MED ORDER — BUPRENORPHINE 20 MCG/HR TD PTWK
1.0000 | MEDICATED_PATCH | TRANSDERMAL | 0 refills | Status: DC
  Filled 2023-07-06: qty 4, 28d supply, fill #0

## 2023-07-06 NOTE — Telephone Encounter (Signed)
 PDMP reviewed.

## 2023-07-06 NOTE — Telephone Encounter (Signed)
 Copied from CRM [id number]. Topic: Clinical - Medication Refill >> Jul 06, 2023 10:42 AM Jayson Michael wrote: Most Recent Primary Care Visit:  Provider: Carleen Chary  Department: [facility]  Visit Type: OPEN ESTABLISHED  Date: 05/30/2023  Medication: buprenorphine  (BUTRANS ) 20 MCG/HR PTWK   Has the patient contacted their pharmacy? Yes Pharmacy stated they need PCP  to do it since it is a controlled substance   Is this the correct pharmacy for this prescription? Yes If no, delete pharmacy and type the correct one.  This is the patient's preferred pharmacy:  [facility] [address] [place] [address] Phone: [phone] Fax: [phone]  [facility] [address] [place] [address] Phone: [phone] Fax: [phone]   Has the prescription been filled recently? No  Is the patient out of the medication? Yes down to last patch   Has the patient been seen for an appointment in the last year OR does the patient have an upcoming appointment? Yes  Can we respond through MyChart? Yes  Agent: Please be advised that Rx refills may take up to 3 business days. We ask that you follow-up with your pharmacy.

## 2023-07-11 ENCOUNTER — Encounter: Payer: Medicare PPO | Admitting: Student

## 2023-07-11 ENCOUNTER — Other Ambulatory Visit (HOSPITAL_BASED_OUTPATIENT_CLINIC_OR_DEPARTMENT_OTHER): Payer: Self-pay

## 2023-07-27 ENCOUNTER — Other Ambulatory Visit (HOSPITAL_BASED_OUTPATIENT_CLINIC_OR_DEPARTMENT_OTHER): Payer: Self-pay

## 2023-07-27 ENCOUNTER — Other Ambulatory Visit: Payer: Self-pay | Admitting: Student

## 2023-07-27 DIAGNOSIS — G894 Chronic pain syndrome: Secondary | ICD-10-CM

## 2023-07-27 DIAGNOSIS — K860 Alcohol-induced chronic pancreatitis: Secondary | ICD-10-CM

## 2023-07-28 ENCOUNTER — Other Ambulatory Visit: Payer: Self-pay | Admitting: Student

## 2023-07-28 MED ORDER — ONDANSETRON 4 MG PO TBDP
4.0000 mg | ORAL_TABLET | Freq: Three times a day (TID) | ORAL | 0 refills | Status: DC | PRN
  Filled 2023-07-28 – 2023-07-29 (×2): qty 20, 7d supply, fill #0

## 2023-07-28 MED ORDER — BELBUCA 75 MCG BU FILM
75.0000 ug | ORAL_FILM | Freq: Every day | BUCCAL | 0 refills | Status: DC
  Filled 2023-07-28 – 2023-07-29 (×2): qty 30, 30d supply, fill #0

## 2023-07-28 NOTE — Progress Notes (Signed)
 Spoke with patient in regards to her nausea/vomiting. She was prescribed a 30 day course of compazine  which lasted her almost 4 months. With the risks of EPS and TD, she would like to try zofran  prn. I asked her to discuss GI referral at her upcoming appointment with the clinic on 05/28.

## 2023-07-29 ENCOUNTER — Other Ambulatory Visit (HOSPITAL_BASED_OUTPATIENT_CLINIC_OR_DEPARTMENT_OTHER): Payer: Self-pay

## 2023-07-29 ENCOUNTER — Other Ambulatory Visit: Payer: Self-pay | Admitting: Student

## 2023-07-29 ENCOUNTER — Other Ambulatory Visit (HOSPITAL_COMMUNITY): Payer: Self-pay

## 2023-07-29 DIAGNOSIS — K860 Alcohol-induced chronic pancreatitis: Secondary | ICD-10-CM

## 2023-07-29 DIAGNOSIS — G894 Chronic pain syndrome: Secondary | ICD-10-CM

## 2023-08-01 ENCOUNTER — Telehealth: Payer: Self-pay | Admitting: *Deleted

## 2023-08-01 ENCOUNTER — Other Ambulatory Visit (HOSPITAL_COMMUNITY): Payer: Self-pay

## 2023-08-01 NOTE — Telephone Encounter (Signed)
 Will forward to PCP. Copied from CRM [id number]. Topic: Clinical - Prescription Issue >> Jul 29, 2023  1:13 PM Tiffany H wrote: Reason for CRM: [facility] called to confirm current dosage Buprenorphine  (20 mcg patches weekly and buprenorphine  75 micro grams daily). Advised of 11/24/22 note of confirmation. No further action needed.

## 2023-08-02 ENCOUNTER — Other Ambulatory Visit: Payer: Self-pay | Admitting: Student

## 2023-08-02 ENCOUNTER — Other Ambulatory Visit (HOSPITAL_BASED_OUTPATIENT_CLINIC_OR_DEPARTMENT_OTHER): Payer: Self-pay

## 2023-08-02 DIAGNOSIS — K860 Alcohol-induced chronic pancreatitis: Secondary | ICD-10-CM

## 2023-08-02 DIAGNOSIS — G894 Chronic pain syndrome: Secondary | ICD-10-CM

## 2023-08-03 MED ORDER — BUPRENORPHINE 20 MCG/HR TD PTWK
1.0000 | MEDICATED_PATCH | TRANSDERMAL | 0 refills | Status: DC
  Filled 2023-08-03: qty 4, 28d supply, fill #0

## 2023-08-04 ENCOUNTER — Other Ambulatory Visit (HOSPITAL_BASED_OUTPATIENT_CLINIC_OR_DEPARTMENT_OTHER): Payer: Self-pay

## 2023-08-05 ENCOUNTER — Other Ambulatory Visit (HOSPITAL_BASED_OUTPATIENT_CLINIC_OR_DEPARTMENT_OTHER): Payer: Self-pay

## 2023-08-10 ENCOUNTER — Encounter: Admitting: Student

## 2023-08-17 ENCOUNTER — Ambulatory Visit (INDEPENDENT_AMBULATORY_CARE_PROVIDER_SITE_OTHER): Payer: Self-pay | Admitting: Internal Medicine

## 2023-08-17 ENCOUNTER — Other Ambulatory Visit (HOSPITAL_BASED_OUTPATIENT_CLINIC_OR_DEPARTMENT_OTHER): Payer: Self-pay

## 2023-08-17 ENCOUNTER — Encounter: Payer: Self-pay | Admitting: Internal Medicine

## 2023-08-17 VITALS — BP 130/69 | HR 67 | Temp 98.2°F | Ht 68.0 in | Wt 99.2 lb

## 2023-08-17 DIAGNOSIS — K7030 Alcoholic cirrhosis of liver without ascites: Secondary | ICD-10-CM

## 2023-08-17 DIAGNOSIS — S7291XD Unspecified fracture of right femur, subsequent encounter for closed fracture with routine healing: Secondary | ICD-10-CM | POA: Diagnosis not present

## 2023-08-17 DIAGNOSIS — E039 Hypothyroidism, unspecified: Secondary | ICD-10-CM | POA: Diagnosis not present

## 2023-08-17 DIAGNOSIS — G894 Chronic pain syndrome: Secondary | ICD-10-CM

## 2023-08-17 DIAGNOSIS — S72064S Nondisplaced articular fracture of head of right femur, sequela: Secondary | ICD-10-CM

## 2023-08-17 MED ORDER — ONDANSETRON 4 MG PO TBDP
4.0000 mg | ORAL_TABLET | Freq: Three times a day (TID) | ORAL | 0 refills | Status: DC | PRN
  Filled 2023-08-17: qty 20, 7d supply, fill #0

## 2023-08-17 MED ORDER — ONDANSETRON 4 MG PO TBDP
4.0000 mg | ORAL_TABLET | Freq: Three times a day (TID) | ORAL | 0 refills | Status: DC | PRN
  Filled 2023-08-17 – 2023-10-03 (×2): qty 20, 7d supply, fill #0

## 2023-08-17 NOTE — Progress Notes (Signed)
 Subjective:  CC: follow up  HPI:  [name] is a 68 y.o. female with a past medical history stated below and presents today for above. Please see problem based assessment and plan for additional details.  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 Elwin Hammond dermatology in [place].      Diabetes mellitus without complication (HCC)    Endometriosis    Gastroesophageal reflux disease without esophagitis 05/08/2017   GERD (gastroesophageal reflux disease)    Intractable nausea and vomiting 06/13/2020   Liver disease    Malabsorption    MALABSORPTION SYNDROME   Osteoporosis 03/2011   t score -2.5   Pancreatitis    due to cyst and tumors due to calcifications   Seizure (HCC)    no seizure disorder, controlled w/ meds per patient   Vitamin D  deficiency 2012   VIT D 15    Current Outpatient Medications on File Prior to Visit  Medication Sig Dispense Refill   buprenorphine  (BUTRANS ) 20 MCG/HR PTWK Place 1 patch onto the skin once a week. 4 patch 0   Buprenorphine  HCl (BELBUCA ) 75 MCG FILM Place 1 film (75 mcg) inside cheek daily at 6 (six) AM. 30 each 0   clobetasol ointment (TEMOVATE) 0.05 % Apply 1 Application topically at bedtime.     famotidine  (PEPCID ) 10 MG tablet Take 10 mg by mouth 2 (two) times daily.     feeding supplement (ENSURE ENLIVE / ENSURE PLUS) LIQD Take 237 mLs by mouth 3 (three) times daily between meals. 237 mL 12   HYDROcodone -acetaminophen  (NORCO/VICODIN) 5-325 MG tablet Take 1 tablet by mouth every 6 (six) hours as needed for up to 10 doses for moderate pain (pain score 4-6). 10 tablet 0   levothyroxine  (SYNTHROID ) 50 MCG tablet Take 1 tablet (50 mcg total) by mouth daily at 6 (six) AM. 90 tablet 3   lipase/protease/amylase (CREON ) 12000-38000 units CPEP capsule Take 2  capsules (24,000 Units total) by mouth 3 (three) times daily with meals. (Patient taking differently: Take 24,000 Units by mouth 3 (three) times daily with meals. Take 1 capsule with snacks) 1600 capsule 0   metoprolol  tartrate (LOPRESSOR ) 25 MG tablet Take 1/2 tablet (12.5 mg total) by mouth 2 (two) times daily. 90 tablet 3   Multiple Vitamin (MULITIVITAMIN WITH MINERALS) TABS Take 1 tablet by mouth daily.     pyridOXINE  (B-6) 100 MG tablet Take 1 tablet (100 mg total) by mouth daily.     senna (SENOKOT) 8.6 MG TABS tablet Take 1 tablet (8.6 mg total) by mouth 2 (two) times daily. 100 tablet 0   vitamin A  3 MG (10000 UNITS) capsule Take 1 capsule (10,000 Units total) by mouth every other day. 30 capsule    vitamin B-12 (CYANOCOBALAMIN ) 100 MCG tablet Take 100 mcg by mouth daily.     vitamin D3 (CHOLECALCIFEROL ) 25 MCG tablet Take 1 tablet (1,000 Units total) by mouth daily.     [DISCONTINUED] Ranitidine HCl (ZANTAC PO) Take by mouth.       No current facility-administered medications on file prior to visit.   Review of Systems: ROS negative except for as is noted on the assessment and plan.  Objective:   Vitals:   08/17/23 1040  BP: 130/69  Pulse: 67  Temp: 98.2 F (36.8 C)  TempSrc: Oral  SpO2: 100%  Weight: 99 lb 3.2 oz (45 kg)  Height: 5\' 8"  (1.727 m)   Physical Exam: Constitutional: well-appearing, in no acute distress Cardiovascular: regular rate and rhythm Pulmonary/Chest: normal work of breathing on room air MSK: normal bulk and tone  Assessment & Plan:   Right femoral fracture (HCC) Patient feels her pain has been well controlled with current dosage of Butrans  and Belbuca . Her mobility has been improving. She was initially referred to physical therapy, though she thinks she was accidentally referred to cognitive therapy. She would like a new PT referral if possible.  - Referral to ambulatory physical therapy placed and referral coordinator messaged about the  situation - Continue Vit D supplementation. Holding alendronate  per orthopedic surgery  Chronic pain syndrome Pain has been well-controlled. She is not due for a refill of Butrans  and Belbuca . Continue current regimen  Alcoholic cirrhosis of liver without ascites (HCC) AFP ordered during recent appointment in January, though wasn't collected at that time. Collect AFP today  Hypothyroidism Symptoms of hypothyroidism have been stable on synthroid  50mcg daily. TSH at goal when last checked 01/2023 - Check TSH today   Patient discussed with Dr. Frankie Israel MD [facility]  PGY-1 Pager: [phone] Date 08/17/2023  Time 12:59 PM

## 2023-08-17 NOTE — Assessment & Plan Note (Signed)
 Patient feels her pain has been well controlled with current dosage of Butrans  and Belbuca . Her mobility has been improving. She was initially referred to physical therapy, though she thinks she was accidentally referred to cognitive therapy. She would like a new PT referral if possible.  - Referral to ambulatory physical therapy placed and referral coordinator messaged about the situation - Continue Vit D supplementation. Holding alendronate  per orthopedic surgery

## 2023-08-17 NOTE — Assessment & Plan Note (Signed)
 Pain has been well-controlled. She is not due for a refill of Butrans  and Belbuca . Continue current regimen

## 2023-08-17 NOTE — Assessment & Plan Note (Signed)
 Symptoms of hypothyroidism have been stable on synthroid  50mcg daily. TSH at goal when last checked 01/2023 - Check TSH today

## 2023-08-17 NOTE — Assessment & Plan Note (Addendum)
 AFP ordered during recent appointment in January, though wasn't collected at that time. Collect AFP today

## 2023-08-17 NOTE — Patient Instructions (Signed)
 Carolyn [name],   It was a pleasure seeing you today.   I have put in a new referral for physical therapy, and sent in a refill for your zofran .   I am checking a thyroid  level and an AFP for your cirrhosis. I will call you with these results.   Thanks,  Dr Esaw Heckler

## 2023-08-18 ENCOUNTER — Ambulatory Visit: Payer: Self-pay | Admitting: Internal Medicine

## 2023-08-18 LAB — TSH: TSH: 0.349 u[IU]/mL — ABNORMAL LOW (ref 0.450–4.500)

## 2023-08-18 LAB — AFP TUMOR MARKER: AFP, Serum, Tumor Marker: 3.5 ng/mL (ref 0.0–9.2)

## 2023-08-25 NOTE — Progress Notes (Signed)
 Internal Medicine Clinic Attending  Case discussed with the resident at the time of the visit.  We reviewed the resident's history and exam and pertinent patient test results.  I agree with the assessment, diagnosis, and plan of care documented in the resident's note.

## 2023-08-27 ENCOUNTER — Other Ambulatory Visit (HOSPITAL_BASED_OUTPATIENT_CLINIC_OR_DEPARTMENT_OTHER): Payer: Self-pay

## 2023-08-29 ENCOUNTER — Other Ambulatory Visit: Payer: Self-pay | Admitting: Student

## 2023-08-29 ENCOUNTER — Other Ambulatory Visit (HOSPITAL_BASED_OUTPATIENT_CLINIC_OR_DEPARTMENT_OTHER): Payer: Self-pay

## 2023-08-29 DIAGNOSIS — K860 Alcohol-induced chronic pancreatitis: Secondary | ICD-10-CM

## 2023-08-29 DIAGNOSIS — G894 Chronic pain syndrome: Secondary | ICD-10-CM

## 2023-08-30 ENCOUNTER — Other Ambulatory Visit (HOSPITAL_COMMUNITY): Payer: Self-pay

## 2023-08-31 ENCOUNTER — Other Ambulatory Visit (HOSPITAL_BASED_OUTPATIENT_CLINIC_OR_DEPARTMENT_OTHER): Payer: Self-pay

## 2023-08-31 MED ORDER — BUPRENORPHINE 20 MCG/HR TD PTWK
1.0000 | MEDICATED_PATCH | TRANSDERMAL | 0 refills | Status: DC
  Filled 2023-09-01: qty 4, 28d supply, fill #0
  Filled ????-??-??: fill #0

## 2023-08-31 MED ORDER — BELBUCA 75 MCG BU FILM
75.0000 ug | ORAL_FILM | Freq: Every day | BUCCAL | 0 refills | Status: DC
  Filled 2023-08-31: qty 30, 30d supply, fill #0

## 2023-09-01 ENCOUNTER — Other Ambulatory Visit (HOSPITAL_BASED_OUTPATIENT_CLINIC_OR_DEPARTMENT_OTHER): Payer: Self-pay

## 2023-09-02 ENCOUNTER — Ambulatory Visit: Payer: Self-pay | Admitting: Student

## 2023-09-19 ENCOUNTER — Other Ambulatory Visit: Payer: Self-pay

## 2023-09-19 ENCOUNTER — Other Ambulatory Visit (HOSPITAL_BASED_OUTPATIENT_CLINIC_OR_DEPARTMENT_OTHER): Payer: Self-pay

## 2023-09-20 ENCOUNTER — Other Ambulatory Visit: Payer: Self-pay

## 2023-09-21 ENCOUNTER — Ambulatory Visit

## 2023-09-21 VITALS — Ht 68.0 in | Wt 102.0 lb

## 2023-09-21 DIAGNOSIS — Z0000 Encounter for general adult medical examination without abnormal findings: Secondary | ICD-10-CM

## 2023-09-21 NOTE — Progress Notes (Signed)
 Because this visit was a virtual/telehealth visit,  certain criteria was not obtained, such a blood pressure, CBG if applicable, and timed get up and go. Any medications not marked as taking were not mentioned during the medication reconciliation part of the visit. Any vitals not documented were not able to be obtained due to this being a telehealth visit or patient was unable to self-report a recent blood pressure reading due to a lack of equipment at home via telehealth. Vitals that have been documented are verbally provided by the patient.   Subjective:   Carolyn [name] is a 68 y.o. who presents for a Medicare Wellness preventive visit.  As a reminder, Annual Wellness Visits don't include a physical exam, and some assessments may be limited, especially if this visit is performed virtually. We may recommend an in-person follow-up visit with your provider if needed.  Visit Complete: Virtual I connected with  Carolyn [name] on 09/21/23 by a audio enabled telemedicine application and verified that I am speaking with the correct person using two identifiers.  Patient Location: Home  Provider Location: Home Office  I discussed the limitations of evaluation and management by telemedicine. The patient expressed understanding and agreed to proceed.  Vital Signs: Because this visit was a virtual/telehealth visit, some criteria may be missing or patient reported. Any vitals not documented were not able to be obtained and vitals that have been documented are patient reported.  VideoDeclined- This patient declined Librarian, academic. Therefore the visit was completed with audio only.  Persons Participating in Visit: Patient.  AWV Questionnaire: Yes, Patient Medicare AWV questionnaire was completed on 09/20/2023 prior to this visit.  Cardiac Risk Factors include: advanced age (>38men, >48 women)     Objective:    Today's Vitals   09/21/23 1503 09/21/23 1504  Weight:  102 lb (46.3 kg)   Height: 5' 8 (1.727 m)   PainSc:  0-No pain   Body mass index is 15.51 kg/m.     09/21/2023    3:07 PM 05/30/2023   12:37 PM 05/14/2023    3:15 PM 12/27/2022    1:11 PM 11/24/2022   11:32 AM 09/06/2022    9:59 AM 08/06/2022    9:02 AM  Advanced Directives  Does Patient Have a Medical Advance Directive? No No No Yes Yes No Yes  Type of Advance Directive    Out of facility DNR (pink MOST or yellow form) Out of facility DNR (pink MOST or yellow form) Out of facility DNR (pink MOST or yellow form) Out of facility DNR (pink MOST or yellow form)  Does patient want to make changes to medical advance directive?    No - Patient declined No - Patient declined No - Patient declined No - Patient declined  Would patient like information on creating a medical advance directive? No - Patient declined No - Patient declined No - Patient declined    No - Patient declined  Pre-existing out of facility DNR order (yellow form or pink MOST form)    Pink MOST form placed in chart (order not valid for inpatient use) Pink MOST form placed in chart (order not valid for inpatient use)      Current Medications (verified) Outpatient Encounter Medications as of 09/21/2023  Medication Sig   buprenorphine  (BUTRANS ) 20 MCG/HR PTWK Place 1 patch onto the skin once a week.   Buprenorphine  HCl (BELBUCA ) 75 MCG FILM Place 1 film (75 mcg) inside cheek daily at 6 (six) AM.  clobetasol ointment (TEMOVATE) 0.05 % Apply 1 Application topically at bedtime.   famotidine  (PEPCID ) 10 MG tablet Take 10 mg by mouth 2 (two) times daily.   feeding supplement (ENSURE ENLIVE / ENSURE PLUS) LIQD Take 237 mLs by mouth 3 (three) times daily between meals.   HYDROcodone -acetaminophen  (NORCO/VICODIN) 5-325 MG tablet Take 1 tablet by mouth every 6 (six) hours as needed for up to 10 doses for moderate pain (pain score 4-6).   levothyroxine  (SYNTHROID ) 50 MCG tablet Take 1 tablet (50 mcg total) by mouth daily at 6 (six) AM.    lipase/protease/amylase (CREON ) 12000-38000 units CPEP capsule Take 2 capsules (24,000 Units total) by mouth 3 (three) times daily with meals. (Patient taking differently: Take 24,000 Units by mouth 3 (three) times daily with meals. Take 1 capsule with snacks)   metoprolol  tartrate (LOPRESSOR ) 25 MG tablet Take 1/2 tablet (12.5 mg total) by mouth 2 (two) times daily.   Multiple Vitamin (MULITIVITAMIN WITH MINERALS) TABS Take 1 tablet by mouth daily.   ondansetron  (ZOFRAN -ODT) 4 MG disintegrating tablet Take 1 tablet (4 mg total) by mouth every 8 (eight) hours as needed for nausea or vomiting.   pyridOXINE  (B-6) 100 MG tablet Take 1 tablet (100 mg total) by mouth daily.   senna (SENOKOT) 8.6 MG TABS tablet Take 1 tablet (8.6 mg total) by mouth 2 (two) times daily.   vitamin A  3 MG (10000 UNITS) capsule Take 1 capsule (10,000 Units total) by mouth every other day.   vitamin B-12 (CYANOCOBALAMIN ) 100 MCG tablet Take 100 mcg by mouth daily.   vitamin D3 (CHOLECALCIFEROL ) 25 MCG tablet Take 1 tablet (1,000 Units total) by mouth daily.   [DISCONTINUED] Ranitidine HCl (ZANTAC PO) Take by mouth.     No facility-administered encounter medications on file as of 09/21/2023.    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 Houston dermatology in [place].      Diabetes mellitus without complication (HCC)    Endometriosis    Gastroesophageal reflux disease without esophagitis 05/08/2017   GERD (gastroesophageal reflux disease)    Intractable nausea and vomiting 06/13/2020   Liver disease    Malabsorption    MALABSORPTION SYNDROME   Osteoporosis 03/2011   t score -2.5   Pancreatitis    due to cyst and tumors due to calcifications   Seizure (HCC)    no seizure disorder,  controlled w/ meds per patient   Vitamin D  deficiency 2012   VIT D 15   Past Surgical History:  Procedure Laterality Date   APPENDECTOMY     BIOPSY  01/19/2022   Procedure: BIOPSY;  Surgeon: San Sandor GAILS, DO;  Location: [facility];  Service: Gastroenterology;;   CATARACT EXTRACTION Right    CHOLECYSTECTOMY     ESOPHAGOGASTRODUODENOSCOPY (EGD) WITH PROPOFOL  N/A 01/19/2022   Procedure: ESOPHAGOGASTRODUODENOSCOPY (EGD) WITH PROPOFOL ;  Surgeon: San Sandor GAILS, DO;  Location: [facility];  Service: Gastroenterology;  Laterality: N/A;   FEEDING TUBE RELOCATION  2010   FEMUR IM NAIL Right 05/14/2023   Procedure: INTRAMEDULLARY (IM) NAIL FEMORAL, RIGHT HIP;  Surgeon: Fidel Rogue, MD;  Location: [facility];  Service: Orthopedics;  Laterality: Right;   JEJUNOSTOMY FEEDING TUBE  1990   MULTIPLE GASTRIC SURGERIES     MYOMECTOMY     OPEN  REDUCTION INTERNAL FIXATION (ORIF) DISTAL RADIAL FRACTURE Left 06/29/2018   Procedure: OPEN REDUCTION INTERNAL FIXATION (ORIF)LEFT  DISTAL RADIAL FRACTURE;  Surgeon: Murrell Drivers, MD;  Location: [facility];  Service: Orthopedics;  Laterality: Left;   ROUX-EN-Y GASTRIC BYPASS     TIBIA IM NAIL INSERTION Left 04/21/2022   Procedure: LEFT INTRAMEDULLARY (IM) NAIL TIBIAL;  Surgeon: Barbarann Oneil BROCKS, MD;  Location: [facility];  Service: Orthopedics;  Laterality: Left;   TIBIA IM NAIL INSERTION Left 05/05/2022   Procedure: LEFT TIBIAL NAIL DISTAL SCREW PLACEMENT;  Surgeon: Barbarann Oneil BROCKS, MD;  Location: [facility];  Service: Orthopedics;  Laterality: Left;   TUBAL LIGATION     Family History  Problem Relation Age of Onset   Cancer Father        BONE   Breast cancer Maternal Grandmother    Social History   Socioeconomic History   Marital status: Single    Spouse name: Not on file   Number of children: Not on file   Years of education: Not on file   Highest education level: Professional school degree (e.g., MD, DDS, DVM, JD)  Occupational History    Not on file  Tobacco Use   Smoking status: Never   Smokeless tobacco: Never  Vaping Use   Vaping status: Never Used  Substance and Sexual Activity   Alcohol  use: Not Currently    Comment: occ ( none in several weeks)   Drug use: No   Sexual activity: Not Currently    Partners: Male    Birth control/protection: Post-menopausal  Other Topics Concern   Not on file  Social History Narrative   Not on file   Social Drivers of Health   Financial Resource Strain: Low Risk  (09/20/2023)   Overall Financial Resource Strain (CARDIA)    Difficulty of Paying Living Expenses: Not hard at all  Food Insecurity: No Food Insecurity (09/20/2023)   Hunger Vital Sign    Worried About Running Out of Food in the Last Year: Never true    Ran Out of Food in the Last Year: Never true  Transportation Needs: No Transportation Needs (09/20/2023)   PRAPARE - Administrator, Civil Service (Medical): No    Lack of Transportation (Non-Medical): No  Physical Activity: Sufficiently Active (09/20/2023)   Exercise Vital Sign    Days of Exercise per Week: 4 days    Minutes of Exercise per Session: 40 min  Stress: No Stress Concern Present (09/20/2023)   [employer] of Occupational Health - Occupational Stress Questionnaire    Feeling of Stress: Not at all  Social Connections: Moderately Isolated (09/20/2023)   Social Connection and Isolation Panel    Frequency of Communication with Friends and Family: More than three times a week    Frequency of Social Gatherings with Friends and Family: Once a week    Attends Religious Services: Never    Database administrator or Organizations: No    Attends Engineer, structural: Not on file    Marital Status: Living with partner    Tobacco Counseling Counseling given: Not Answered    Clinical Intake:  Pre-visit preparation completed: Yes  Pain : No/denies pain Pain Score: 0-No pain     BMI - recorded: 15.51 Nutritional Status: BMI <19   Underweight Nutritional Risks: None Diabetes: No  Lab Results  Component Value Date   HGBA1C 5.3 05/14/2023   HGBA1C 5.3 04/07/2022   HGBA1C 4.6 (L) 01/06/2022  How often do you need to have someone help you when you read instructions, pamphlets, or other written materials from your doctor or pharmacy?: 1 - Never  Interpreter Needed?: No  Information entered by :: Lamiya Naas N. Tanyla Stege, LPN.   Activities of Daily Living     09/21/2023    3:15 PM 09/20/2023    1:02 PM  In your present state of health, do you have any difficulty performing the following activities:  Hearing? 0 0  Vision? 0 0  Difficulty concentrating or making decisions? 0 0  Walking or climbing stairs? 0 0  Dressing or bathing? 0 0  Doing errands, shopping? 0 0  Preparing Food and eating ? N N  Using the Toilet? N N  In the past six months, have you accidently leaked urine? N N  Do you have problems with loss of bowel control? N N  Managing your Medications? N N  Managing your Finances? N N  Housekeeping or managing your Housekeeping? N N    Patient Care Team: Kandis Perkins, DO as PCP - General Marvine Almarie CROME, DO as Consulting Physician (Internal Medicine) Madelyn Deanne BRAVO, OD as Consulting Physician (Optometry)  I have updated your Care Teams any recent Medical Services you may have received from other providers in the past year.     Assessment:   This is a routine wellness examination for Carolyn [name].  Hearing/Vision screen Hearing Screening - Comments:: Adequate hearing. No hearing aids. Vision Screening - Comments:: Wears rx glasses - up to date with routine eye exams with eye doctor located in [facility] (Patient could not remember name)    Goals Addressed             This Visit's Progress    09/21/2023: Get back stronger so that I can get back to riding my horses, walk more with my dogs and put on some weight.         Depression Screen     09/21/2023    3:08 PM 04/12/2023    3:14 PM  01/27/2023    1:13 PM 12/27/2022    1:11 PM 11/24/2022   11:34 AM 10/04/2022   10:00 AM 09/06/2022    9:59 AM  PHQ 2/9 Scores  PHQ - 2 Score 0 0 0 0 0 0 0  PHQ- 9 Score 0     0 0    Fall Risk     09/21/2023    3:15 PM 09/20/2023    1:02 PM 04/12/2023    3:14 PM 12/27/2022    1:10 PM 11/24/2022   11:33 AM  Fall Risk   Falls in the past year? 1 1 0 1 1  Number falls in past yr: 0 0 0 1 1  Injury with Fall? 1 1 0 1 1  Risk for fall due to :   No Fall Risks Impaired balance/gait   Follow up Falls evaluation completed;Education provided  Falls evaluation completed;Falls prevention discussed Falls evaluation completed;Falls prevention discussed Falls evaluation completed    MEDICARE RISK AT HOME:  Medicare Risk at Home Any stairs in or around the home?: Yes If so, are there any without handrails?: No Home free of loose throw rugs in walkways, pet beds, electrical cords, etc?: Yes Adequate lighting in your home to reduce risk of falls?: Yes Life alert?: No Use of a cane, walker or w/c?: No Grab bars in the bathroom?: Yes Shower chair or bench in shower?: No Elevated toilet seat or a handicapped toilet?:  No  TIMED UP AND GO:  Was the test performed?  No  Cognitive Function: 6CIT completed    09/21/2023    3:08 PM  MMSE - Mini Mental State Exam  Not completed: Unable to complete        09/21/2023    3:14 PM 07/13/2022    3:32 PM  6CIT Screen  What Year? 0 points 0 points  What month? 0 points 0 points  What time? 0 points 0 points  Count back from 20 0 points 0 points  Months in reverse 0 points 0 points  Repeat phrase 0 points 0 points  Total Score 0 points 0 points    Immunizations Immunization History  Administered Date(s) Administered   Fluad Trivalent(High Dose 65+) 12/27/2022   Influenza Split 03/11/2011   Influenza,inj,Quad PF,6+ Mos 03/29/2022   PFIZER(Purple Top)SARS-COV-2 Vaccination 10/01/2019   PNEUMOCOCCAL CONJUGATE-20 09/06/2022   Tdap 06/12/2020     Screening Tests Health Maintenance  Topic Date Due   Zoster Vaccines- Shingrix (1 of 2) Never done   Colonoscopy  Never done   Hepatitis B Vaccines (1 of 3 - Risk 3-dose series) Never done   COVID-19 Vaccine (2 - Pfizer risk series) 10/22/2019   INFLUENZA VACCINE  10/14/2023   Medicare Annual Wellness (AWV)  09/20/2024   MAMMOGRAM  01/31/2025   DTaP/Tdap/Td (2 - Td or Tdap) 06/13/2030   Pneumococcal Vaccine: 50+ Years  Completed   DEXA SCAN  Completed   Hepatitis C Screening  Completed   HPV VACCINES  Aged Out   Meningococcal B Vaccine  Aged Out    Health Maintenance  Health Maintenance Due  Topic Date Due   Zoster Vaccines- Shingrix (1 of 2) Never done   Colonoscopy  Never done   Hepatitis B Vaccines (1 of 3 - Risk 3-dose series) Never done   COVID-19 Vaccine (2 - Pfizer risk series) 10/22/2019   Health Maintenance Items Addressed: Yes Patient aware of current care gaps.  Immunization record was verified by [employer].    Additional Screening:  Vision Screening: Recommended annual ophthalmology exams for early detection of glaucoma and other disorders of the eye. Would you like a referral to an eye doctor? No    Dental Screening: Recommended annual dental exams for proper oral hygiene  Community Resource Referral / Chronic Care Management: CRR required this visit?  No   CCM required this visit?  No   Plan:    I have personally reviewed and noted the following in the patient's chart:   Medical and social history Use of alcohol , tobacco or illicit drugs  Current medications and supplements including opioid prescriptions. Patient is not currently taking opioid prescriptions. Functional ability and status Nutritional status Physical activity Advanced directives List of other physicians Hospitalizations, surgeries, and ER visits in previous 12 months Vitals Screenings to include cognitive, depression, and falls Referrals and appointments  In addition, I have  reviewed and discussed with patient certain preventive protocols, quality metrics, and best practice recommendations. A written personalized care plan for preventive services as well as general preventive health recommendations were provided to patient.   Roz LOISE Fuller, LPN   2/0/7974   After Visit Summary: (MyChart) Due to this being a telephonic visit, the after visit summary with patients personalized plan was offered to patient via MyChart   Notes: Patient aware of current care gaps.  Immunization record was verified by [employer].

## 2023-09-21 NOTE — Patient Instructions (Signed)
 Carolyn [name] , Thank you for taking time out of your busy schedule to complete your Annual Wellness Visit with me. I enjoyed our conversation and look forward to speaking with you again next year. I, as well as your care team,  appreciate your ongoing commitment to your health goals. Please review the following plan we discussed and let me know if I can assist you in the future. Your Game plan/ To Do List    Referrals: If you haven't heard from the office you've been referred to, please reach out to them at the phone provided.   Follow up Visits: Next Medicare AWV with our clinical staff: 09/26/2024 at 3:40 pm phone visit with Nurse Roz   Have you seen your provider in the last 6 months (3 months if uncontrolled diabetes)? Yes Next Office Visit with your provider: Patient is due September 2025 for an office visit  Clinician Recommendations:  Aim for 30 minutes of exercise or brisk walking, 6-8 glasses of water, and 5 servings of fruits and vegetables each day.       This is a list of the screening recommended for you and due dates:  Health Maintenance  Topic Date Due   Zoster (Shingles) Vaccine (1 of 2) Never done   Colon Cancer Screening  Never done   Hepatitis B Vaccine (1 of 3 - Risk 3-dose series) Never done   COVID-19 Vaccine (2 - Pfizer risk series) 10/22/2019   Flu Shot  10/14/2023   Medicare Annual Wellness Visit  09/20/2024   Mammogram  01/31/2025   DTaP/Tdap/Td vaccine (2 - Td or Tdap) 06/13/2030   Pneumococcal Vaccine for age over 72  Completed   DEXA scan (bone density measurement)  Completed   Hepatitis C Screening  Completed   HPV Vaccine  Aged Out   Meningitis B Vaccine  Aged Out    Advanced directives: (Declined) Advance directive discussed with you today. Even though you declined this today, please call our office should you change your mind, and we can give you the proper paperwork for you to fill out. Advance Care Planning is important because it:  [x]  Makes  sure you receive the medical care that is consistent with your values, goals, and preferences  [x]  It provides guidance to your family and loved ones and reduces their decisional burden about whether or not they are making the right decisions based on your wishes.  Follow the link provided in your after visit summary or read over the paperwork we have mailed to you to help you started getting your Advance Directives in place. If you need assistance in completing these, please reach out to us  so that we can help you!  See attachments for Preventive Care and Fall Prevention Tips.

## 2023-09-26 ENCOUNTER — Other Ambulatory Visit: Payer: Self-pay | Admitting: Student

## 2023-09-26 ENCOUNTER — Other Ambulatory Visit (HOSPITAL_BASED_OUTPATIENT_CLINIC_OR_DEPARTMENT_OTHER): Payer: Self-pay

## 2023-09-26 DIAGNOSIS — K860 Alcohol-induced chronic pancreatitis: Secondary | ICD-10-CM

## 2023-09-26 DIAGNOSIS — G894 Chronic pain syndrome: Secondary | ICD-10-CM

## 2023-09-26 NOTE — Telephone Encounter (Signed)
 Last rx written - 66/19/25. Last OV - 08/17/23. TOX - 04/12/23.

## 2023-09-29 ENCOUNTER — Other Ambulatory Visit (HOSPITAL_BASED_OUTPATIENT_CLINIC_OR_DEPARTMENT_OTHER): Payer: Self-pay

## 2023-09-29 ENCOUNTER — Other Ambulatory Visit: Payer: Self-pay

## 2023-09-29 ENCOUNTER — Other Ambulatory Visit: Payer: Self-pay | Admitting: Student

## 2023-09-29 DIAGNOSIS — G894 Chronic pain syndrome: Secondary | ICD-10-CM

## 2023-09-29 DIAGNOSIS — K860 Alcohol-induced chronic pancreatitis: Secondary | ICD-10-CM

## 2023-09-29 MED ORDER — BELBUCA 75 MCG BU FILM
75.0000 ug | ORAL_FILM | Freq: Every day | BUCCAL | 0 refills | Status: DC
Start: 2023-09-30 — End: 2023-10-25
  Filled 2023-09-30: qty 30, 30d supply, fill #0
  Filled ????-??-??: fill #0

## 2023-09-29 MED ORDER — BUPRENORPHINE 20 MCG/HR TD PTWK
1.0000 | MEDICATED_PATCH | TRANSDERMAL | 0 refills | Status: DC
  Filled 2023-09-29 (×2): qty 4, 28d supply, fill #0

## 2023-09-29 NOTE — Telephone Encounter (Signed)
 Copied from CRM [id number]. Topic: Clinical - Medication Refill >> Sep 29, 2023 10:10 AM Miquel SAILOR wrote: Medication: buprenorphine  (BUTRANS ) 20 MCG/HR PTWK   Has the patient contacted their pharmacy? Yes (Agent: If no, request that the patient contact the pharmacy for the refill. If patient does not wish to contact the pharmacy document the reason why and proceed with request.) (Agent: If yes, when and what did the pharmacy advise?)  This is the patient's preferred pharmacy:    [facility] [address] [place] [address] Phone: [phone] Fax: [phone]  Is this the correct pharmacy for this prescription? Yes If no, delete pharmacy and type the correct one.   Has the prescription been filled recently? Yes  Is the patient out of the medication? Yes  Has the patient been seen for an appointment in the last year OR does the patient have an upcoming appointment? Yes  Can we respond through MyChart? Yes  Agent: Please be advised that Rx refills may take up to 3 business days. We ask that you follow-up with your pharmacy.

## 2023-09-29 NOTE — Telephone Encounter (Signed)
 08/31/23 30 tabs/0 RF

## 2023-09-30 ENCOUNTER — Other Ambulatory Visit (HOSPITAL_BASED_OUTPATIENT_CLINIC_OR_DEPARTMENT_OTHER): Payer: Self-pay

## 2023-09-30 ENCOUNTER — Other Ambulatory Visit: Payer: Self-pay

## 2023-10-03 ENCOUNTER — Other Ambulatory Visit: Payer: Self-pay

## 2023-10-03 ENCOUNTER — Other Ambulatory Visit (HOSPITAL_BASED_OUTPATIENT_CLINIC_OR_DEPARTMENT_OTHER): Payer: Self-pay

## 2023-10-12 ENCOUNTER — Other Ambulatory Visit (HOSPITAL_BASED_OUTPATIENT_CLINIC_OR_DEPARTMENT_OTHER): Payer: Self-pay

## 2023-10-14 NOTE — Progress Notes (Signed)
 Internal Medicine Attending:  I reviewed the AWV findings of the medical professional who conducted the visit. I was present in the office suite and immediately available to provide assistance and direction throughout the time the service was provided.

## 2023-10-25 ENCOUNTER — Other Ambulatory Visit: Payer: Self-pay | Admitting: Student

## 2023-10-25 ENCOUNTER — Other Ambulatory Visit: Payer: Self-pay | Admitting: Internal Medicine

## 2023-10-25 ENCOUNTER — Other Ambulatory Visit (HOSPITAL_BASED_OUTPATIENT_CLINIC_OR_DEPARTMENT_OTHER): Payer: Self-pay

## 2023-10-25 ENCOUNTER — Other Ambulatory Visit: Payer: Self-pay

## 2023-10-25 DIAGNOSIS — G894 Chronic pain syndrome: Secondary | ICD-10-CM

## 2023-10-25 DIAGNOSIS — K860 Alcohol-induced chronic pancreatitis: Secondary | ICD-10-CM

## 2023-10-26 ENCOUNTER — Other Ambulatory Visit: Payer: Self-pay

## 2023-10-26 ENCOUNTER — Other Ambulatory Visit (HOSPITAL_BASED_OUTPATIENT_CLINIC_OR_DEPARTMENT_OTHER): Payer: Self-pay

## 2023-10-26 ENCOUNTER — Other Ambulatory Visit: Payer: Self-pay | Admitting: Student

## 2023-10-26 DIAGNOSIS — G894 Chronic pain syndrome: Secondary | ICD-10-CM

## 2023-10-26 DIAGNOSIS — K860 Alcohol-induced chronic pancreatitis: Secondary | ICD-10-CM

## 2023-10-26 MED ORDER — BELBUCA 75 MCG BU FILM
75.0000 ug | ORAL_FILM | Freq: Every day | BUCCAL | 0 refills | Status: DC
  Filled 2023-11-02: qty 30, 30d supply, fill #0
  Filled ????-??-?? (×2): fill #0

## 2023-10-26 MED ORDER — ONDANSETRON 4 MG PO TBDP
4.0000 mg | ORAL_TABLET | Freq: Three times a day (TID) | ORAL | 0 refills | Status: DC | PRN
  Filled 2023-10-26 (×2): qty 20, 7d supply, fill #0

## 2023-10-26 NOTE — Progress Notes (Signed)
 Last refill for butrans  53mcg/ week was 09/29/23. Next refill for 28 days due 8/14. Correct date for refill ordered.

## 2023-10-27 ENCOUNTER — Other Ambulatory Visit (HOSPITAL_BASED_OUTPATIENT_CLINIC_OR_DEPARTMENT_OTHER): Payer: Self-pay

## 2023-10-27 MED FILL — Buprenorphine TD Patch Weekly 20 MCG/HR: TRANSDERMAL | 28 days supply | Qty: 4 | Fill #0 | Status: AC

## 2023-10-28 ENCOUNTER — Other Ambulatory Visit (HOSPITAL_BASED_OUTPATIENT_CLINIC_OR_DEPARTMENT_OTHER): Payer: Self-pay

## 2023-11-02 ENCOUNTER — Other Ambulatory Visit: Payer: Self-pay

## 2023-11-02 ENCOUNTER — Other Ambulatory Visit (HOSPITAL_BASED_OUTPATIENT_CLINIC_OR_DEPARTMENT_OTHER): Payer: Self-pay

## 2023-11-16 DIAGNOSIS — S61002A Unspecified open wound of left thumb without damage to nail, initial encounter: Secondary | ICD-10-CM | POA: Diagnosis not present

## 2023-11-23 ENCOUNTER — Other Ambulatory Visit (HOSPITAL_BASED_OUTPATIENT_CLINIC_OR_DEPARTMENT_OTHER): Payer: Self-pay

## 2023-11-23 ENCOUNTER — Other Ambulatory Visit: Payer: Self-pay | Admitting: Student

## 2023-11-23 DIAGNOSIS — G894 Chronic pain syndrome: Secondary | ICD-10-CM

## 2023-11-23 DIAGNOSIS — K860 Alcohol-induced chronic pancreatitis: Secondary | ICD-10-CM

## 2023-11-24 ENCOUNTER — Other Ambulatory Visit: Payer: Self-pay | Admitting: Student

## 2023-11-24 ENCOUNTER — Other Ambulatory Visit (HOSPITAL_BASED_OUTPATIENT_CLINIC_OR_DEPARTMENT_OTHER): Payer: Self-pay

## 2023-11-24 DIAGNOSIS — K860 Alcohol-induced chronic pancreatitis: Secondary | ICD-10-CM

## 2023-11-24 DIAGNOSIS — G894 Chronic pain syndrome: Secondary | ICD-10-CM

## 2023-11-24 MED ORDER — BUPRENORPHINE 20 MCG/HR TD PTWK
1.0000 | MEDICATED_PATCH | TRANSDERMAL | 0 refills | Status: DC
  Filled 2023-11-24: qty 4, 28d supply, fill #0

## 2023-11-24 MED ORDER — BELBUCA 75 MCG BU FILM
75.0000 ug | ORAL_FILM | Freq: Every day | BUCCAL | 0 refills | Status: DC
  Filled 2023-12-02: qty 30, 30d supply, fill #0

## 2023-11-25 ENCOUNTER — Other Ambulatory Visit: Payer: Self-pay

## 2023-11-28 DIAGNOSIS — N7689 Other specified inflammation of vagina and vulva: Secondary | ICD-10-CM | POA: Diagnosis not present

## 2023-12-02 ENCOUNTER — Other Ambulatory Visit (HOSPITAL_COMMUNITY): Payer: Self-pay

## 2023-12-02 ENCOUNTER — Other Ambulatory Visit (HOSPITAL_BASED_OUTPATIENT_CLINIC_OR_DEPARTMENT_OTHER): Payer: Self-pay

## 2023-12-05 ENCOUNTER — Other Ambulatory Visit: Payer: Self-pay

## 2023-12-19 ENCOUNTER — Other Ambulatory Visit: Payer: Self-pay | Admitting: Student

## 2023-12-19 ENCOUNTER — Other Ambulatory Visit (HOSPITAL_BASED_OUTPATIENT_CLINIC_OR_DEPARTMENT_OTHER): Payer: Self-pay

## 2023-12-19 DIAGNOSIS — K860 Alcohol-induced chronic pancreatitis: Secondary | ICD-10-CM

## 2023-12-19 DIAGNOSIS — G894 Chronic pain syndrome: Secondary | ICD-10-CM

## 2023-12-20 ENCOUNTER — Other Ambulatory Visit (HOSPITAL_BASED_OUTPATIENT_CLINIC_OR_DEPARTMENT_OTHER): Payer: Self-pay

## 2023-12-20 MED ORDER — BUPRENORPHINE 20 MCG/HR TD PTWK
1.0000 | MEDICATED_PATCH | TRANSDERMAL | 0 refills | Status: DC
  Filled 2023-12-22: qty 4, 28d supply, fill #0

## 2023-12-22 ENCOUNTER — Other Ambulatory Visit (HOSPITAL_BASED_OUTPATIENT_CLINIC_OR_DEPARTMENT_OTHER): Payer: Self-pay

## 2024-01-02 ENCOUNTER — Other Ambulatory Visit (HOSPITAL_BASED_OUTPATIENT_CLINIC_OR_DEPARTMENT_OTHER): Payer: Self-pay

## 2024-01-02 ENCOUNTER — Other Ambulatory Visit: Payer: Self-pay | Admitting: Student

## 2024-01-02 ENCOUNTER — Other Ambulatory Visit: Payer: Self-pay

## 2024-01-02 DIAGNOSIS — G894 Chronic pain syndrome: Secondary | ICD-10-CM

## 2024-01-02 DIAGNOSIS — K860 Alcohol-induced chronic pancreatitis: Secondary | ICD-10-CM

## 2024-01-02 MED ORDER — LEVOTHYROXINE SODIUM 50 MCG PO TABS
50.0000 ug | ORAL_TABLET | Freq: Every day | ORAL | 3 refills | Status: AC
  Filled 2024-01-02: qty 90, 90d supply, fill #0

## 2024-01-02 NOTE — Telephone Encounter (Signed)
 Last rx written - 12/02/23. Last OV - 08/17/23. TOX - 04/12/23.

## 2024-01-04 ENCOUNTER — Other Ambulatory Visit (HOSPITAL_BASED_OUTPATIENT_CLINIC_OR_DEPARTMENT_OTHER): Payer: Self-pay

## 2024-01-04 MED ORDER — BELBUCA 75 MCG BU FILM
75.0000 ug | ORAL_FILM | Freq: Every day | BUCCAL | 0 refills | Status: DC
  Filled 2024-01-04: qty 30, 30d supply, fill #0

## 2024-01-10 ENCOUNTER — Other Ambulatory Visit: Payer: Self-pay

## 2024-01-10 ENCOUNTER — Other Ambulatory Visit (HOSPITAL_BASED_OUTPATIENT_CLINIC_OR_DEPARTMENT_OTHER): Payer: Self-pay

## 2024-01-10 DIAGNOSIS — G894 Chronic pain syndrome: Secondary | ICD-10-CM

## 2024-01-10 DIAGNOSIS — K860 Alcohol-induced chronic pancreatitis: Secondary | ICD-10-CM

## 2024-01-11 ENCOUNTER — Other Ambulatory Visit (HOSPITAL_BASED_OUTPATIENT_CLINIC_OR_DEPARTMENT_OTHER): Payer: Self-pay

## 2024-01-12 ENCOUNTER — Other Ambulatory Visit (HOSPITAL_BASED_OUTPATIENT_CLINIC_OR_DEPARTMENT_OTHER): Payer: Self-pay

## 2024-01-12 DIAGNOSIS — C44319 Basal cell carcinoma of skin of other parts of face: Secondary | ICD-10-CM | POA: Diagnosis not present

## 2024-01-12 MED ORDER — BUPRENORPHINE 20 MCG/HR TD PTWK
1.0000 | MEDICATED_PATCH | TRANSDERMAL | 0 refills | Status: DC
  Filled 2024-01-19 (×2): qty 4, 28d supply, fill #0

## 2024-01-19 ENCOUNTER — Other Ambulatory Visit: Payer: Self-pay

## 2024-01-19 ENCOUNTER — Other Ambulatory Visit (HOSPITAL_BASED_OUTPATIENT_CLINIC_OR_DEPARTMENT_OTHER): Payer: Self-pay

## 2024-02-03 ENCOUNTER — Other Ambulatory Visit: Payer: Self-pay | Admitting: Student

## 2024-02-03 ENCOUNTER — Other Ambulatory Visit (HOSPITAL_BASED_OUTPATIENT_CLINIC_OR_DEPARTMENT_OTHER): Payer: Self-pay

## 2024-02-03 DIAGNOSIS — G894 Chronic pain syndrome: Secondary | ICD-10-CM

## 2024-02-03 DIAGNOSIS — K860 Alcohol-induced chronic pancreatitis: Secondary | ICD-10-CM

## 2024-02-06 ENCOUNTER — Other Ambulatory Visit (HOSPITAL_BASED_OUTPATIENT_CLINIC_OR_DEPARTMENT_OTHER): Payer: Self-pay

## 2024-02-06 MED ORDER — BELBUCA 75 MCG BU FILM
75.0000 ug | ORAL_FILM | Freq: Every day | BUCCAL | 0 refills | Status: AC
  Filled 2024-02-06: qty 30, 30d supply, fill #0

## 2024-02-13 ENCOUNTER — Other Ambulatory Visit: Payer: Self-pay | Admitting: Student

## 2024-02-13 ENCOUNTER — Other Ambulatory Visit (HOSPITAL_BASED_OUTPATIENT_CLINIC_OR_DEPARTMENT_OTHER): Payer: Self-pay

## 2024-02-13 ENCOUNTER — Other Ambulatory Visit: Payer: Self-pay

## 2024-02-13 DIAGNOSIS — G894 Chronic pain syndrome: Secondary | ICD-10-CM

## 2024-02-13 DIAGNOSIS — K860 Alcohol-induced chronic pancreatitis: Secondary | ICD-10-CM

## 2024-02-13 MED ORDER — METOPROLOL TARTRATE 25 MG PO TABS
12.5000 mg | ORAL_TABLET | Freq: Two times a day (BID) | ORAL | 1 refills | Status: DC
  Filled 2024-02-13: qty 90, 90d supply, fill #0

## 2024-02-14 ENCOUNTER — Other Ambulatory Visit: Payer: Self-pay

## 2024-02-14 ENCOUNTER — Other Ambulatory Visit (HOSPITAL_BASED_OUTPATIENT_CLINIC_OR_DEPARTMENT_OTHER): Payer: Self-pay

## 2024-02-14 MED FILL — Buprenorphine TD Patch Weekly 20 MCG/HR: TRANSDERMAL | 28 days supply | Qty: 4 | Fill #0 | Status: AC

## 2024-02-14 MED FILL — Buprenorphine TD Patch Weekly 20 MCG/HR: TRANSDERMAL | 28 days supply | Qty: 4 | Fill #0 | Status: CN

## 2024-02-27 ENCOUNTER — Other Ambulatory Visit (HOSPITAL_BASED_OUTPATIENT_CLINIC_OR_DEPARTMENT_OTHER): Payer: Self-pay

## 2024-03-01 ENCOUNTER — Ambulatory Visit: Payer: Self-pay

## 2024-03-01 ENCOUNTER — Other Ambulatory Visit: Payer: Self-pay | Admitting: Student

## 2024-03-01 ENCOUNTER — Other Ambulatory Visit (HOSPITAL_BASED_OUTPATIENT_CLINIC_OR_DEPARTMENT_OTHER): Payer: Self-pay

## 2024-03-01 DIAGNOSIS — K860 Alcohol-induced chronic pancreatitis: Secondary | ICD-10-CM

## 2024-03-01 DIAGNOSIS — G894 Chronic pain syndrome: Secondary | ICD-10-CM

## 2024-03-01 NOTE — Telephone Encounter (Signed)
 Last rx written - 02/06/24. Last OV - 09/06/23. Next OV - 04/05/24. TOX - 04/12/23.

## 2024-03-02 ENCOUNTER — Other Ambulatory Visit (HOSPITAL_BASED_OUTPATIENT_CLINIC_OR_DEPARTMENT_OTHER): Payer: Self-pay

## 2024-03-02 MED ORDER — BELBUCA 75 MCG BU FILM
75.0000 ug | ORAL_FILM | Freq: Every day | BUCCAL | 0 refills | Status: DC
  Filled 2024-03-06: qty 30, 30d supply, fill #0

## 2024-03-05 ENCOUNTER — Other Ambulatory Visit: Payer: Self-pay

## 2024-03-05 ENCOUNTER — Other Ambulatory Visit (HOSPITAL_BASED_OUTPATIENT_CLINIC_OR_DEPARTMENT_OTHER): Payer: Self-pay

## 2024-03-06 ENCOUNTER — Other Ambulatory Visit: Payer: Self-pay

## 2024-03-06 ENCOUNTER — Other Ambulatory Visit (HOSPITAL_BASED_OUTPATIENT_CLINIC_OR_DEPARTMENT_OTHER): Payer: Self-pay

## 2024-03-13 ENCOUNTER — Other Ambulatory Visit: Payer: Self-pay | Admitting: Student

## 2024-03-13 ENCOUNTER — Other Ambulatory Visit (HOSPITAL_BASED_OUTPATIENT_CLINIC_OR_DEPARTMENT_OTHER): Payer: Self-pay

## 2024-03-13 ENCOUNTER — Other Ambulatory Visit: Payer: Self-pay

## 2024-03-13 DIAGNOSIS — K860 Alcohol-induced chronic pancreatitis: Secondary | ICD-10-CM

## 2024-03-13 DIAGNOSIS — G894 Chronic pain syndrome: Secondary | ICD-10-CM

## 2024-03-14 ENCOUNTER — Other Ambulatory Visit: Payer: Self-pay

## 2024-03-14 ENCOUNTER — Other Ambulatory Visit (HOSPITAL_BASED_OUTPATIENT_CLINIC_OR_DEPARTMENT_OTHER): Payer: Self-pay

## 2024-03-14 MED ORDER — BUPRENORPHINE 20 MCG/HR TD PTWK
1.0000 | MEDICATED_PATCH | TRANSDERMAL | 0 refills | Status: DC
  Filled 2024-03-14 (×2): qty 4, 28d supply, fill #0

## 2024-03-19 ENCOUNTER — Other Ambulatory Visit: Payer: Self-pay

## 2024-03-21 ENCOUNTER — Other Ambulatory Visit (HOSPITAL_BASED_OUTPATIENT_CLINIC_OR_DEPARTMENT_OTHER): Payer: Self-pay

## 2024-03-21 ENCOUNTER — Other Ambulatory Visit: Payer: Self-pay | Admitting: Student

## 2024-03-22 ENCOUNTER — Other Ambulatory Visit (HOSPITAL_BASED_OUTPATIENT_CLINIC_OR_DEPARTMENT_OTHER): Payer: Self-pay

## 2024-03-22 ENCOUNTER — Ambulatory Visit

## 2024-03-22 MED ORDER — PANCRELIPASE (LIP-PROT-AMYL) 12000-38000 UNITS PO CPEP
24000.0000 [IU] | ORAL_CAPSULE | Freq: Three times a day (TID) | ORAL | 0 refills | Status: AC
  Filled 2024-03-22: qty 500, 84d supply, fill #0

## 2024-04-05 ENCOUNTER — Other Ambulatory Visit (HOSPITAL_BASED_OUTPATIENT_CLINIC_OR_DEPARTMENT_OTHER): Payer: Self-pay

## 2024-04-05 ENCOUNTER — Ambulatory Visit: Admitting: Student

## 2024-04-05 ENCOUNTER — Other Ambulatory Visit (HOSPITAL_COMMUNITY): Payer: Self-pay

## 2024-04-05 ENCOUNTER — Other Ambulatory Visit (HOSPITAL_COMMUNITY): Payer: Self-pay | Admitting: Student

## 2024-04-05 VITALS — BP 149/81 | HR 61 | Ht 68.0 in | Wt 107.8 lb

## 2024-04-05 DIAGNOSIS — L120 Bullous pemphigoid: Secondary | ICD-10-CM | POA: Diagnosis not present

## 2024-04-05 DIAGNOSIS — K860 Alcohol-induced chronic pancreatitis: Secondary | ICD-10-CM

## 2024-04-05 DIAGNOSIS — M8080XP Other osteoporosis with current pathological fracture, unspecified site, subsequent encounter for fracture with malunion: Secondary | ICD-10-CM

## 2024-04-05 DIAGNOSIS — G894 Chronic pain syndrome: Secondary | ICD-10-CM | POA: Diagnosis not present

## 2024-04-05 DIAGNOSIS — K7030 Alcoholic cirrhosis of liver without ascites: Secondary | ICD-10-CM

## 2024-04-05 DIAGNOSIS — I5032 Chronic diastolic (congestive) heart failure: Secondary | ICD-10-CM

## 2024-04-05 DIAGNOSIS — E039 Hypothyroidism, unspecified: Secondary | ICD-10-CM

## 2024-04-05 DIAGNOSIS — G8929 Other chronic pain: Secondary | ICD-10-CM

## 2024-04-05 DIAGNOSIS — D638 Anemia in other chronic diseases classified elsewhere: Secondary | ICD-10-CM | POA: Diagnosis not present

## 2024-04-05 DIAGNOSIS — M810 Age-related osteoporosis without current pathological fracture: Secondary | ICD-10-CM

## 2024-04-05 MED ORDER — METOPROLOL SUCCINATE ER 25 MG PO TB24
12.5000 mg | ORAL_TABLET | Freq: Every day | ORAL | 11 refills | Status: AC
  Filled 2024-04-05: qty 15, 30d supply, fill #0

## 2024-04-05 MED ORDER — ONDANSETRON 4 MG PO TBDP
4.0000 mg | ORAL_TABLET | Freq: Three times a day (TID) | ORAL | 0 refills | Status: AC | PRN
  Filled 2024-04-05: qty 20, 7d supply, fill #0

## 2024-04-05 MED ORDER — BELBUCA 75 MCG BU FILM
75.0000 ug | ORAL_FILM | Freq: Every day | BUCCAL | 0 refills | Status: AC
  Filled 2024-04-05: qty 30, 30d supply, fill #0

## 2024-04-05 NOTE — Patient Instructions (Signed)
 Thank you, Carolyn [name] for allowing us  to provide your care today. Today we discussed chronic pain syndrome on belbuca /butrans , blood pressure, heart failure, thyroid , cirrhosis, osteoporosis.    For the osteoporosis: We will contact pharmacy and get back to you on how to get the Reclast (IV version of alendronate ).   For the blood pressure and heart failure: Stop lopressor  twice a day and start Toprol  XL 12.5 mg ONCE per day  For the pancrease and liver: Please see a GI doctor, I ordered an ultrasound and AFP to check your liver!  For the bullous pemphigoid: please see dermatology   I have ordered the following labs for you:   Lab Orders         ToxAssure Select,+Antidepr,UR         AFP Tumor Marker         TSH         Comprehensive metabolic panel with GFR         CBC no Diff      Referrals ordered today:    Referral Orders         Ambulatory referral to Gastroenterology         Ambulatory referral to Dermatology      I have ordered the following medication/changed the following medications:   Stop the following medications: Medications Discontinued During This Encounter  Medication Reason   ondansetron  (ZOFRAN -ODT) 4 MG disintegrating tablet Reorder   Buprenorphine  HCl (BELBUCA ) 75 MCG FILM Reorder   metoprolol  tartrate (LOPRESSOR ) 25 MG tablet      Start the following medications: Meds ordered this encounter  Medications   ondansetron  (ZOFRAN -ODT) 4 MG disintegrating tablet    Sig: Take 1 tablet (4 mg total) by mouth every 8 (eight) hours as needed for nausea or vomiting. Additional refills require PCP appointment.    Dispense:  20 tablet    Refill:  0   Buprenorphine  HCl (BELBUCA ) 75 MCG FILM    Sig: Place 75 mcg inside cheek daily at 6 (six) AM.    Dispense:  30 each    Refill:  0   metoprolol  succinate (TOPROL  XL) 25 MG 24 hr tablet    Sig: Take 0.5 tablets (12.5 mg total) by mouth daily.    Dispense:  15 tablet    Refill:  11     Follow up: 6 months  or sooner if needed   Remember:   Should you have any questions or concerns please call the internal medicine clinic at [phone].     Please note that our late policy has changed.  If you are more than 15 minutes late to your appointment, you may be asked to reschedule your appointment.  Dr. Kandis, D.O. [facility]

## 2024-04-05 NOTE — Progress Notes (Addendum)
 "  Established Patient Office Visit  Subjective   Patient ID: Carolyn [name], female    DOB: 1956-03-05  Age: 69 y.o. MRN: [id number]  Chief Complaint  Patient presents with   Medical Management of Chronic Issues   Medication Refill    Carolyn [name] is a 69 y.o. who presents to the clinic for a follow-up of chronic conditions including chronic pain syndrome due to chronic pancreatitis, cirrhosis, hypothyroidism, HFpEF, and osteoporosis. Please see problem based assessment and plan for additional details.   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   Abnormal loss of weight 03/23/2022   Chronic diastolic heart failure (HCC) 03/23/2022   Anemia of chronic disease 03/17/2022   Alcoholic cirrhosis of liver without ascites (HCC) 01/18/2022   Portal hypertensive gastropathy (HCC) 01/18/2022   Physical deconditioning 01/09/2022   Severe protein-calorie malnutrition 01/07/2022   Normocytic anemia 01/07/2022   Osteomyelitis (HCC) 01/06/2022   Seizure (HCC) 05/08/2017   Chronic pain syndrome 05/08/2017   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HCC) 05/08/2017   Osteoporosis 03/16/2011   Malabsorption      Objective:     BP (!) 149/81 (BP Location: Right Arm, Patient Position: Sitting, Cuff Size: Normal)   Pulse 61   Ht 5' 8 (1.727 m)   Wt 107 lb 12.8 oz (48.9 kg)   LMP 03/10/2009   SpO2 97%   BMI 16.39 kg/m  BP Readings from Last 3 Encounters:  04/05/24 (!) 149/81  08/17/23 130/69  06/16/23 139/78   Wt Readings from Last 3 Encounters:  04/05/24 107 lb 12.8 oz (48.9 kg)  09/21/23 102 lb (46.3 kg)  08/17/23 99 lb 3.2 oz (45 kg)      Physical Exam Vitals reviewed.  Constitutional:      Appearance: She is underweight. She is not  ill-appearing or toxic-appearing.  Cardiovascular:     Rate and Rhythm: Normal rate and regular rhythm.     Heart sounds: No murmur heard.    Comments: Trace edema of the B/L LE to the level of the ankles Pulmonary:     Effort: Pulmonary effort is normal.     Breath sounds: Normal breath sounds.  Skin:    General: Skin is warm and dry.  Neurological:     Mental Status: She is alert.    Results for orders placed or performed in visit on 04/05/24  AFP Tumor Marker  Result Value Ref Range   AFP, Serum, Tumor Marker 4.6 0.0 - 9.2 ng/mL  CBC no Diff  Result Value Ref Range   WBC 7.5 3.4 - 10.8 x10E3/uL   RBC 3.90 3.77 - 5.28 x10E6/uL   Hemoglobin 12.0 11.1 - 15.9 g/dL   Hematocrit 62.7 65.9 - 46.6 %   MCV 95 79 - 97 fL   MCH 30.8 26.6 - 33.0 pg   MCHC 32.3 31.5 - 35.7 g/dL   RDW 87.8 88.2 - 84.5 %   Platelets 201 150 - 450 x10E3/uL  Comprehensive metabolic panel with GFR  Result Value Ref Range   Glucose 100 (H) 70 - 99 mg/dL   BUN 13 8 - 27 mg/dL   Creatinine, Ser 9.15 0.57 - 1.00 mg/dL   eGFR 77 >40 fO/fpw/8.26   BUN/Creatinine Ratio 15 12 - 28   Sodium 139 134 -  144 mmol/L   Potassium 3.7 3.5 - 5.2 mmol/L   Chloride 100 96 - 106 mmol/L   CO2 20 20 - 29 mmol/L   Calcium  9.1 8.7 - 10.3 mg/dL   Total Protein 6.7 6.0 - 8.5 g/dL   Albumin  4.2 3.9 - 4.9 g/dL   Globulin, Total 2.5 1.5 - 4.5 g/dL   Bilirubin Total 0.5 0.0 - 1.2 mg/dL   Alkaline Phosphatase 187 (H) 49 - 135 IU/L   AST 71 (H) 0 - 40 IU/L   ALT 30 0 - 32 IU/L  TSH  Result Value Ref Range   TSH 4.020 0.450 - 4.500 uIU/mL    Last metabolic panel Lab Results  Component Value Date   GLUCOSE 100 (H) 04/05/2024   NA 139 04/05/2024   K 3.7 04/05/2024   CL 100 04/05/2024   CO2 20 04/05/2024   BUN 13 04/05/2024   CREATININE 0.84 04/05/2024   GFRNONAA >60 05/30/2023   CALCIUM  9.1 04/05/2024   PHOS 1.9 (L) 05/16/2023   PROT 6.7 04/05/2024   ALBUMIN  4.2 04/05/2024   LABGLOB 2.5 04/05/2024   AGRATIO 1.4  08/06/2022   BILITOT 0.5 04/05/2024   ALKPHOS 187 (H) 04/05/2024   AST 71 (H) 04/05/2024   ALT 30 04/05/2024   ANIONGAP 8 05/30/2023   Last lipids Lab Results  Component Value Date   TRIG 59 03/29/2022   Last hemoglobin A1c Lab Results  Component Value Date   HGBA1C 5.3 05/14/2023      The ASCVD Risk score (Arnett DK, et al., 2019) failed to calculate for the following reasons:   Cannot find a previous HDL lab   Cannot find a previous total cholesterol lab   * - Cholesterol units were assumed    Assessment & Plan:   Problem List Items Addressed This Visit       Cardiovascular and Mediastinum   Chronic diastolic heart failure (HCC) (Chronic)   Patient appears euvolemic today with very trace edema of the bilateral lower extremities.  She is currently on Lopressor  12.5 mg twice daily. Plan: - DC Lopressor , will start Toprol  XL 12.5 mg daily for GDMT      Relevant Medications   metoprolol  succinate (TOPROL  XL) 25 MG 24 hr tablet     Digestive   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HCC) (Chronic)   Patient has a history of chronic abdominal pain due to chronic alcoholic pancreatitis complicated by pancreatic insufficiency.  She reports that her pain has been well-controlled on her regimen of Belbuca  75 mcg daily and Butrans  25 mcg patch weekly.  She has not seen GI for this problem. Plan: - Tox assure ordered -Contact signed -GI referral sent      Relevant Medications   Buprenorphine  HCl (BELBUCA ) 75 MCG FILM   Alcoholic cirrhosis of liver without ascites (HCC) - Primary (Chronic)   Patient presents with a history of compensated alcoholic cirrhosis.  Patient is not drinking alcohol . she does not follow with GI at this time.  Her last ultrasound surveillance was performed last spring with an AFP last performed in June.  On exam, there is no ascites or jaundice. Plan: - Ultrasound ordered every 6 month surveillance -AFP ordered -GI referral sent      Relevant Orders    US  Abdomen Limited RUQ (LIVER/GB)   Ambulatory referral to Gastroenterology   AFP Tumor Marker (Completed)   Comprehensive metabolic panel with GFR (Completed)     Endocrine   Hypothyroidism   She is currently on  a regimen of Synthroid  50 mcg daily.  She denies symptoms of hypothyroidism at this time. Plan: - TSH ordered      Relevant Medications   metoprolol  succinate (TOPROL  XL) 25 MG 24 hr tablet   Other Relevant Orders   TSH (Completed)     Musculoskeletal and Integument   Osteoporosis   Patient has a history of osteoporosis and has not been on any bisphosphonate since last spring when she fractured her right femur.  Patient reports that her orthopedic surgeon instructed her to not use bisphosphonates for a year after her fracture.  Patient reports that she was on a liquid bisphosphonate until the time of the fracture.  With her history of pancreatic insufficiency and malabsorption, patient needs either Reclast infusion or Prolia moving forward.  Plan: - BMP obtained, creatinine clearance is 50.9. - Reclast ordered - Patient instructed to continue calcium  and vitamin D       Bullous pemphigoid (HCC)   Patient reports history of bullous pemphigoid, chart review also reports history of bullous pemphigoid in 2024.  Patient has not had any more recurrences of bullae since then. Plan: - Refer to dermatology for management and prognosis of bullous pemphigoid      Relevant Orders   Ambulatory referral to Dermatology     Other   Chronic pain syndrome (Chronic)   Relevant Medications   Buprenorphine  HCl (BELBUCA ) 75 MCG FILM   Anemia of chronic disease   Relevant Orders   CBC no Diff (Completed)   Other Visit Diagnoses       Other chronic pain       Relevant Medications   Buprenorphine  HCl (BELBUCA ) 75 MCG FILM   Other Relevant Orders   ToxAssure Select,+Antidepr,UR     Alcohol -induced chronic pancreatitis (HCC)       Relevant Medications   Buprenorphine  HCl (BELBUCA )  75 MCG FILM       Return in about 6 months (around 10/03/2024) for Chronic conditions .    Damien Lease, DO  "

## 2024-04-05 NOTE — Progress Notes (Signed)
 Reclast referral is pending renal function/calcium  from today to result  Sherry Pennant, PharmD, MPH, BCPS, CPP Clinical Pharmacist

## 2024-04-06 ENCOUNTER — Telehealth (HOSPITAL_COMMUNITY): Payer: Self-pay | Admitting: Pharmacy Technician

## 2024-04-06 ENCOUNTER — Other Ambulatory Visit (HOSPITAL_COMMUNITY): Payer: Self-pay | Admitting: Internal Medicine

## 2024-04-06 DIAGNOSIS — L120 Bullous pemphigoid: Secondary | ICD-10-CM | POA: Insufficient documentation

## 2024-04-06 LAB — CBC
Hematocrit: 37.2 % (ref 34.0–46.6)
Hemoglobin: 12 g/dL (ref 11.1–15.9)
MCH: 30.8 pg (ref 26.6–33.0)
MCHC: 32.3 g/dL (ref 31.5–35.7)
MCV: 95 fL (ref 79–97)
Platelets: 201 x10E3/uL (ref 150–450)
RBC: 3.9 x10E6/uL (ref 3.77–5.28)
RDW: 12.1 % (ref 11.7–15.4)
WBC: 7.5 x10E3/uL (ref 3.4–10.8)

## 2024-04-06 LAB — COMPREHENSIVE METABOLIC PANEL WITH GFR
ALT: 30 IU/L (ref 0–32)
AST: 71 IU/L — ABNORMAL HIGH (ref 0–40)
Albumin: 4.2 g/dL (ref 3.9–4.9)
Alkaline Phosphatase: 187 IU/L — ABNORMAL HIGH (ref 49–135)
BUN/Creatinine Ratio: 15 (ref 12–28)
BUN: 13 mg/dL (ref 8–27)
Bilirubin Total: 0.5 mg/dL (ref 0.0–1.2)
CO2: 20 mmol/L (ref 20–29)
Calcium: 9.1 mg/dL (ref 8.7–10.3)
Chloride: 100 mmol/L (ref 96–106)
Creatinine, Ser: 0.84 mg/dL (ref 0.57–1.00)
Globulin, Total: 2.5 g/dL (ref 1.5–4.5)
Glucose: 100 mg/dL — ABNORMAL HIGH (ref 70–99)
Potassium: 3.7 mmol/L (ref 3.5–5.2)
Sodium: 139 mmol/L (ref 134–144)
Total Protein: 6.7 g/dL (ref 6.0–8.5)
eGFR: 77 mL/min/1.73

## 2024-04-06 LAB — TSH: TSH: 4.02 u[IU]/mL (ref 0.450–4.500)

## 2024-04-06 LAB — AFP TUMOR MARKER: AFP, Serum, Tumor Marker: 4.6 ng/mL (ref 0.0–9.2)

## 2024-04-06 NOTE — Assessment & Plan Note (Signed)
 Patient reports history of bullous pemphigoid, chart review also reports history of bullous pemphigoid in 2024.  Patient has not had any more recurrences of bullae since then. Plan: - Refer to dermatology for management and prognosis of bullous pemphigoid

## 2024-04-06 NOTE — Assessment & Plan Note (Signed)
 Patient has a history of chronic abdominal pain due to chronic alcoholic pancreatitis complicated by pancreatic insufficiency.  She reports that her pain has been well-controlled on her regimen of Belbuca  75 mcg daily and Butrans  25 mcg patch weekly.  She has not seen GI for this problem. Plan: - Tox assure ordered -Contact signed -GI referral sent

## 2024-04-06 NOTE — Assessment & Plan Note (Addendum)
 Patient presents with a history of compensated alcoholic cirrhosis.  Patient is not drinking alcohol . she does not follow with GI at this time.  Her last ultrasound surveillance was performed last spring with an AFP last performed in June.  On exam, there is no ascites or jaundice. Plan: - Ultrasound ordered every 6 month surveillance -AFP ordered -GI referral sent

## 2024-04-06 NOTE — Assessment & Plan Note (Addendum)
 Patient has a history of osteoporosis and has not been on any bisphosphonate since last spring when she fractured her right femur.  Patient reports that her orthopedic surgeon instructed her to not use bisphosphonates for a year after her fracture.  Patient reports that she was on a liquid bisphosphonate until the time of the fracture.  With her history of pancreatic insufficiency and malabsorption, patient needs either Reclast infusion or Prolia moving forward.  Plan: - BMP obtained, creatinine clearance is 50.9. - Reclast ordered - Patient instructed to continue calcium  and vitamin D 

## 2024-04-06 NOTE — Assessment & Plan Note (Addendum)
 Patient appears euvolemic today with very trace edema of the bilateral lower extremities.  She is currently on Lopressor  12.5 mg twice daily. Plan: - DC Lopressor , will start Toprol  XL 12.5 mg daily for GDMT

## 2024-04-06 NOTE — Telephone Encounter (Signed)
 Auth Submission: NO AUTH NEEDED Site of care: [facility] Payer: [employer] MEDICARE Medication & CPT/J Code(s) submitted: Reclast (Zolendronic acid) [id number] Diagnosis Code: M81.0 Route of submission (phone, fax, portal):  Phone # Fax # Auth type: Buy/Bill HB Units/visits requested: 5mg  x 1 dose, every 12 months Reference number:  Approval from: 04/06/2024 to 07/05/24      Dagoberto Armour, CPhT [facility] Phone: [phone] 04/06/2024

## 2024-04-06 NOTE — Assessment & Plan Note (Signed)
 She is currently on a regimen of Synthroid  50 mcg daily.  She denies symptoms of hypothyroidism at this time. Plan: - TSH ordered

## 2024-04-06 NOTE — Progress Notes (Signed)
 Renal and calcium  labs wnl  Sherry Pennant, PharmD, MPH, BCPS, CPP Clinical Pharmacist

## 2024-04-11 ENCOUNTER — Other Ambulatory Visit (HOSPITAL_BASED_OUTPATIENT_CLINIC_OR_DEPARTMENT_OTHER): Payer: Self-pay

## 2024-04-11 ENCOUNTER — Ambulatory Visit: Payer: Self-pay | Admitting: Student

## 2024-04-11 ENCOUNTER — Other Ambulatory Visit: Payer: Self-pay | Admitting: Student

## 2024-04-11 ENCOUNTER — Other Ambulatory Visit: Payer: Self-pay | Admitting: Internal Medicine

## 2024-04-11 ENCOUNTER — Other Ambulatory Visit: Payer: Self-pay

## 2024-04-11 DIAGNOSIS — G894 Chronic pain syndrome: Secondary | ICD-10-CM

## 2024-04-11 DIAGNOSIS — K860 Alcohol-induced chronic pancreatitis: Secondary | ICD-10-CM

## 2024-04-11 MED ORDER — BUPRENORPHINE 20 MCG/HR TD PTWK
1.0000 | MEDICATED_PATCH | TRANSDERMAL | 0 refills | Status: AC
  Filled 2024-04-11 (×2): qty 4, 28d supply, fill #0

## 2024-04-11 MED ORDER — BUPRENORPHINE 20 MCG/HR TD PTWK
1.0000 | MEDICATED_PATCH | TRANSDERMAL | 0 refills | Status: DC
  Filled 2024-04-11: qty 4, 28d supply, fill #0

## 2024-04-11 NOTE — Telephone Encounter (Signed)
 Call from pharmacy [facility]). Dr Oscar DEA#  is not allowing buprenorphine  to be refilled. Rx will have to be submitted by different MD.

## 2024-04-11 NOTE — Addendum Note (Signed)
 Addended by: FREDDI THEOPLIS BROCKS on: 04/11/2024 11:19 AM   Modules accepted: Orders

## 2024-04-11 NOTE — Telephone Encounter (Signed)
 Last prescription 03/14/2024.  Last appointment 04/06/2023.  No future appointments.  ToxAssure 04/12/2023 WNL.

## 2024-04-12 LAB — TOXASSURE SELECT,+ANTIDEPR,UR

## 2024-04-14 NOTE — Progress Notes (Signed)
 Internal Medicine Clinic Attending  Case discussed with the resident at the time of the visit.  We reviewed the resident's history and exam and pertinent patient test results.  I agree with the assessment, diagnosis, and plan of care documented in the resident's note.

## 2024-04-19 ENCOUNTER — Inpatient Hospital Stay (HOSPITAL_COMMUNITY)
Admission: RE | Admit: 2024-04-19 | Discharge: 2024-04-19 | Disposition: A | Source: Ambulatory Visit | Attending: Internal Medicine

## 2024-04-19 VITALS — BP 110/73 | HR 90 | Temp 97.8°F | Resp 14

## 2024-04-19 DIAGNOSIS — M8080XP Other osteoporosis with current pathological fracture, unspecified site, subsequent encounter for fracture with malunion: Secondary | ICD-10-CM

## 2024-04-19 MED ORDER — ACETAMINOPHEN 325 MG PO TABS
ORAL_TABLET | ORAL | Status: AC
  Filled 2024-04-19: qty 2

## 2024-04-19 MED ORDER — DIPHENHYDRAMINE HCL 25 MG PO CAPS: 25.0000 mg | ORAL_CAPSULE | Freq: Once | ORAL | Status: DC

## 2024-04-19 MED ORDER — ZOLEDRONIC ACID 5 MG/100ML IV SOLN
5.0000 mg | Freq: Once | INTRAVENOUS | Status: AC
  Administered 2024-04-19: 5 mg via INTRAVENOUS

## 2024-04-19 MED ORDER — DIPHENHYDRAMINE HCL 25 MG PO CAPS
ORAL_CAPSULE | ORAL | Status: AC
  Filled 2024-04-19: qty 1

## 2024-04-19 MED ORDER — ACETAMINOPHEN 325 MG PO TABS
650.0000 mg | ORAL_TABLET | Freq: Once | ORAL | Status: AC
  Administered 2024-04-19: 650 mg via ORAL

## 2024-04-19 MED ORDER — ZOLEDRONIC ACID 5 MG/100ML IV SOLN
INTRAVENOUS | Status: AC
  Filled 2024-04-19: qty 100

## 2024-04-25 ENCOUNTER — Ambulatory Visit (HOSPITAL_COMMUNITY)

## 2024-09-26 ENCOUNTER — Ambulatory Visit

## 2024-10-01 ENCOUNTER — Ambulatory Visit: Admitting: Physician Assistant
